# Patient Record
Sex: Female | Born: 1935
Health system: Southern US, Community
[De-identification: ages and names within clinical notes are randomized; demographics above are authoritative.]

## PROBLEM LIST (undated history)

## (undated) DIAGNOSIS — M1990 Unspecified osteoarthritis, unspecified site: Secondary | ICD-10-CM

## (undated) DIAGNOSIS — R112 Nausea with vomiting, unspecified: Secondary | ICD-10-CM

## (undated) DIAGNOSIS — E049 Nontoxic goiter, unspecified: Secondary | ICD-10-CM

## (undated) DIAGNOSIS — R233 Spontaneous ecchymoses: Secondary | ICD-10-CM

## (undated) DIAGNOSIS — Z9889 Other specified postprocedural states: Secondary | ICD-10-CM

## (undated) DIAGNOSIS — Z862 Personal history of diseases of the blood and blood-forming organs and certain disorders involving the immune mechanism: Secondary | ICD-10-CM

## (undated) DIAGNOSIS — Z974 Presence of external hearing-aid: Secondary | ICD-10-CM

## (undated) DIAGNOSIS — D649 Anemia, unspecified: Secondary | ICD-10-CM

## (undated) DIAGNOSIS — I2541 Coronary artery aneurysm: Secondary | ICD-10-CM

## (undated) DIAGNOSIS — M549 Dorsalgia, unspecified: Secondary | ICD-10-CM

## (undated) DIAGNOSIS — Z972 Presence of dental prosthetic device (complete) (partial): Secondary | ICD-10-CM

## (undated) DIAGNOSIS — N179 Acute kidney failure, unspecified: Secondary | ICD-10-CM

## (undated) DIAGNOSIS — Z973 Presence of spectacles and contact lenses: Secondary | ICD-10-CM

## (undated) DIAGNOSIS — T4145XA Adverse effect of unspecified anesthetic, initial encounter: Secondary | ICD-10-CM

## (undated) DIAGNOSIS — Z8639 Personal history of other endocrine, nutritional and metabolic disease: Secondary | ICD-10-CM

## (undated) DIAGNOSIS — K5792 Diverticulitis of intestine, part unspecified, without perforation or abscess without bleeding: Secondary | ICD-10-CM

## (undated) DIAGNOSIS — J849 Interstitial pulmonary disease, unspecified: Secondary | ICD-10-CM

## (undated) DIAGNOSIS — K250 Acute gastric ulcer with hemorrhage: Secondary | ICD-10-CM

## (undated) DIAGNOSIS — R238 Other skin changes: Secondary | ICD-10-CM

## (undated) DIAGNOSIS — C8290 Follicular lymphoma, unspecified, unspecified site: Secondary | ICD-10-CM

## (undated) DIAGNOSIS — R413 Other amnesia: Secondary | ICD-10-CM

## (undated) DIAGNOSIS — T8859XA Other complications of anesthesia, initial encounter: Secondary | ICD-10-CM

## (undated) DIAGNOSIS — K5730 Diverticulosis of large intestine without perforation or abscess without bleeding: Secondary | ICD-10-CM

## (undated) DIAGNOSIS — C4490 Unspecified malignant neoplasm of skin, unspecified: Secondary | ICD-10-CM

## (undated) DIAGNOSIS — C50919 Malignant neoplasm of unspecified site of unspecified female breast: Secondary | ICD-10-CM

## (undated) DIAGNOSIS — R42 Dizziness and giddiness: Secondary | ICD-10-CM

## (undated) DIAGNOSIS — I4891 Unspecified atrial fibrillation: Secondary | ICD-10-CM

## (undated) DIAGNOSIS — I10 Essential (primary) hypertension: Secondary | ICD-10-CM

## (undated) HISTORY — DX: Dizziness and giddiness: R42

## (undated) HISTORY — DX: Dorsalgia, unspecified: M54.9

## (undated) HISTORY — DX: Unspecified osteoarthritis, unspecified site: M19.90

## (undated) HISTORY — DX: Unspecified malignant neoplasm of skin, unspecified: C44.90

## (undated) HISTORY — PX: KNEE SURGERY: SHX244

## (undated) HISTORY — DX: Presence of external hearing-aid: Z97.4

## (undated) HISTORY — DX: Malignant neoplasm of unspecified site of unspecified female breast: C50.919

## (undated) HISTORY — DX: Diverticulosis of large intestine without perforation or abscess without bleeding: K57.30

## (undated) HISTORY — DX: Personal history of diseases of the blood and blood-forming organs and certain disorders involving the immune mechanism: Z86.2

## (undated) HISTORY — PX: BREAST SURGERY: SHX581

## (undated) HISTORY — DX: Unspecified atrial fibrillation: I48.91

## (undated) HISTORY — PX: APPENDECTOMY: SHX54

## (undated) HISTORY — DX: Nontoxic goiter, unspecified: E04.9

## (undated) HISTORY — PX: TONSILLECTOMY: SUR1361

## (undated) HISTORY — DX: Acute gastric ulcer with hemorrhage: K25.0

## (undated) HISTORY — DX: Other specified postprocedural states: Z98.890

## (undated) HISTORY — DX: Presence of dental prosthetic device (complete) (partial): Z97.2

## (undated) HISTORY — PX: TOTAL KNEE ARTHROPLASTY: SHX125

## (undated) HISTORY — DX: Personal history of other endocrine, nutritional and metabolic disease: Z86.39

## (undated) HISTORY — DX: Other amnesia: R41.3

## (undated) HISTORY — DX: Essential (primary) hypertension: I10

## (undated) HISTORY — PX: ABDOMINAL HYSTERECTOMY: SHX81

## (undated) HISTORY — PX: LOOP RECORDER IMPLANT: SHX5954

## (undated) HISTORY — PX: COLONOSCOPY: SHX174

## (undated) HISTORY — DX: Presence of spectacles and contact lenses: Z97.3

## (undated) HISTORY — DX: Acute kidney failure, unspecified: N17.9

---

## 1996-01-25 HISTORY — PX: OTHER SURGICAL HISTORY: SHX169

## 1997-01-24 HISTORY — PX: ROTATOR CUFF REPAIR: SHX139

## 1997-10-23 ENCOUNTER — Ambulatory Visit (HOSPITAL_COMMUNITY): Admission: RE | Admit: 1997-10-23 | Discharge: 1997-10-23 | Payer: Self-pay | Admitting: Internal Medicine

## 1998-11-13 ENCOUNTER — Encounter: Payer: Self-pay | Admitting: Internal Medicine

## 1998-11-13 ENCOUNTER — Ambulatory Visit (HOSPITAL_COMMUNITY): Admission: RE | Admit: 1998-11-13 | Discharge: 1998-11-13 | Payer: Self-pay | Admitting: Internal Medicine

## 1999-11-15 ENCOUNTER — Ambulatory Visit (HOSPITAL_COMMUNITY): Admission: RE | Admit: 1999-11-15 | Discharge: 1999-11-15 | Payer: Self-pay | Admitting: Internal Medicine

## 1999-11-15 ENCOUNTER — Encounter: Payer: Self-pay | Admitting: Internal Medicine

## 1999-12-02 ENCOUNTER — Emergency Department (HOSPITAL_COMMUNITY): Admission: EM | Admit: 1999-12-02 | Discharge: 1999-12-02 | Payer: Self-pay | Admitting: Emergency Medicine

## 1999-12-02 ENCOUNTER — Encounter: Payer: Self-pay | Admitting: Emergency Medicine

## 2000-11-15 ENCOUNTER — Ambulatory Visit (HOSPITAL_COMMUNITY): Admission: RE | Admit: 2000-11-15 | Discharge: 2000-11-15 | Payer: Self-pay | Admitting: Internal Medicine

## 2000-11-15 ENCOUNTER — Encounter: Payer: Self-pay | Admitting: Internal Medicine

## 2001-10-30 ENCOUNTER — Ambulatory Visit (HOSPITAL_COMMUNITY): Admission: RE | Admit: 2001-10-30 | Discharge: 2001-10-30 | Payer: Self-pay | Admitting: Internal Medicine

## 2001-10-30 ENCOUNTER — Encounter: Payer: Self-pay | Admitting: Internal Medicine

## 2001-11-01 ENCOUNTER — Encounter: Payer: Self-pay | Admitting: Internal Medicine

## 2001-11-01 ENCOUNTER — Encounter: Admission: RE | Admit: 2001-11-01 | Discharge: 2001-11-01 | Payer: Self-pay | Admitting: Internal Medicine

## 2002-06-05 ENCOUNTER — Encounter: Admission: RE | Admit: 2002-06-05 | Discharge: 2002-06-05 | Payer: Self-pay | Admitting: Orthopedic Surgery

## 2002-06-05 ENCOUNTER — Encounter: Payer: Self-pay | Admitting: Diagnostic Radiology

## 2002-07-19 ENCOUNTER — Encounter: Payer: Self-pay | Admitting: Orthopedic Surgery

## 2002-07-24 ENCOUNTER — Inpatient Hospital Stay (HOSPITAL_COMMUNITY): Admission: RE | Admit: 2002-07-24 | Discharge: 2002-07-28 | Payer: Self-pay | Admitting: Orthopedic Surgery

## 2002-07-24 ENCOUNTER — Encounter: Payer: Self-pay | Admitting: Orthopedic Surgery

## 2002-09-11 ENCOUNTER — Encounter (INDEPENDENT_AMBULATORY_CARE_PROVIDER_SITE_OTHER): Payer: Self-pay | Admitting: *Deleted

## 2002-09-11 ENCOUNTER — Encounter: Payer: Self-pay | Admitting: Internal Medicine

## 2002-09-11 ENCOUNTER — Other Ambulatory Visit: Admission: RE | Admit: 2002-09-11 | Discharge: 2002-09-11 | Payer: Self-pay | Admitting: Diagnostic Radiology

## 2002-09-11 ENCOUNTER — Encounter: Admission: RE | Admit: 2002-09-11 | Discharge: 2002-09-11 | Payer: Self-pay | Admitting: Internal Medicine

## 2002-10-02 ENCOUNTER — Encounter: Payer: Self-pay | Admitting: Internal Medicine

## 2002-10-02 ENCOUNTER — Encounter: Admission: RE | Admit: 2002-10-02 | Discharge: 2002-10-02 | Payer: Self-pay | Admitting: Internal Medicine

## 2003-10-28 ENCOUNTER — Encounter: Admission: RE | Admit: 2003-10-28 | Discharge: 2003-10-28 | Payer: Self-pay | Admitting: Internal Medicine

## 2003-12-09 ENCOUNTER — Ambulatory Visit: Payer: Self-pay | Admitting: Internal Medicine

## 2004-06-29 ENCOUNTER — Ambulatory Visit: Payer: Self-pay | Admitting: Internal Medicine

## 2004-11-03 ENCOUNTER — Encounter: Admission: RE | Admit: 2004-11-03 | Discharge: 2004-11-03 | Payer: Self-pay | Admitting: Internal Medicine

## 2004-11-19 ENCOUNTER — Ambulatory Visit: Payer: Self-pay | Admitting: Internal Medicine

## 2005-03-21 ENCOUNTER — Ambulatory Visit: Payer: Self-pay | Admitting: Internal Medicine

## 2005-03-25 ENCOUNTER — Ambulatory Visit: Payer: Self-pay | Admitting: Internal Medicine

## 2005-04-04 ENCOUNTER — Ambulatory Visit: Payer: Self-pay | Admitting: Gastroenterology

## 2005-04-14 ENCOUNTER — Encounter: Payer: Self-pay | Admitting: Internal Medicine

## 2005-04-14 ENCOUNTER — Ambulatory Visit: Payer: Self-pay | Admitting: Gastroenterology

## 2005-04-25 ENCOUNTER — Ambulatory Visit: Payer: Self-pay | Admitting: Internal Medicine

## 2005-04-27 ENCOUNTER — Encounter: Admission: RE | Admit: 2005-04-27 | Discharge: 2005-04-27 | Payer: Self-pay | Admitting: Internal Medicine

## 2005-11-04 ENCOUNTER — Encounter: Payer: Self-pay | Admitting: Internal Medicine

## 2005-11-04 ENCOUNTER — Encounter: Admission: RE | Admit: 2005-11-04 | Discharge: 2005-11-04 | Payer: Self-pay | Admitting: Internal Medicine

## 2005-12-14 ENCOUNTER — Ambulatory Visit: Payer: Self-pay | Admitting: Internal Medicine

## 2005-12-14 LAB — CONVERTED CEMR LAB
ALT: 74 units/L — ABNORMAL HIGH (ref 0–40)
AST: 76 units/L — ABNORMAL HIGH (ref 0–37)
Albumin: 3.8 g/dL (ref 3.5–5.2)
Alkaline Phosphatase: 90 units/L (ref 39–117)
BUN: 16 mg/dL (ref 6–23)
Bilirubin, Direct: 0.1 mg/dL (ref 0.0–0.3)
CO2: 30 meq/L (ref 19–32)
Calcium: 9.5 mg/dL (ref 8.4–10.5)
Chloride: 108 meq/L (ref 96–112)
Creatinine, Ser: 0.7 mg/dL (ref 0.4–1.2)
FSH: 46.9 milliintl units/mL
GFR calc non Af Amer: 88 mL/min
Glomerular Filtration Rate, Af Am: 107 mL/min/{1.73_m2}
Glucose, Bld: 91 mg/dL (ref 70–99)
HCT: 45.1 % (ref 36.0–46.0)
Hemoglobin: 14.9 g/dL (ref 12.0–15.0)
MCHC: 33 g/dL (ref 30.0–36.0)
MCV: 97.8 fL (ref 78.0–100.0)
Platelets: 201 10*3/uL (ref 150–400)
Potassium: 4.7 meq/L (ref 3.5–5.1)
RBC: 4.62 M/uL (ref 3.87–5.11)
RDW: 12.1 % (ref 11.5–14.6)
Sodium: 143 meq/L (ref 135–145)
TSH: 0.43 microintl units/mL (ref 0.35–5.50)
Total Bilirubin: 1.1 mg/dL (ref 0.3–1.2)
Total Protein: 6.6 g/dL (ref 6.0–8.3)
WBC: 8.2 10*3/uL (ref 4.5–10.5)

## 2005-12-19 ENCOUNTER — Encounter: Payer: Self-pay | Admitting: Internal Medicine

## 2005-12-19 ENCOUNTER — Ambulatory Visit: Payer: Self-pay

## 2006-03-24 ENCOUNTER — Ambulatory Visit: Payer: Self-pay | Admitting: Internal Medicine

## 2006-04-05 ENCOUNTER — Encounter: Payer: Self-pay | Admitting: Internal Medicine

## 2006-04-05 ENCOUNTER — Ambulatory Visit: Payer: Self-pay | Admitting: Internal Medicine

## 2006-11-06 ENCOUNTER — Encounter: Admission: RE | Admit: 2006-11-06 | Discharge: 2006-11-06 | Payer: Self-pay | Admitting: Internal Medicine

## 2006-11-07 ENCOUNTER — Ambulatory Visit: Payer: Self-pay | Admitting: Internal Medicine

## 2006-12-26 DIAGNOSIS — M1990 Unspecified osteoarthritis, unspecified site: Secondary | ICD-10-CM

## 2006-12-26 DIAGNOSIS — K5730 Diverticulosis of large intestine without perforation or abscess without bleeding: Secondary | ICD-10-CM | POA: Insufficient documentation

## 2006-12-26 HISTORY — DX: Unspecified osteoarthritis, unspecified site: M19.90

## 2006-12-26 HISTORY — DX: Diverticulosis of large intestine without perforation or abscess without bleeding: K57.30

## 2006-12-27 ENCOUNTER — Inpatient Hospital Stay (HOSPITAL_COMMUNITY): Admission: EM | Admit: 2006-12-27 | Discharge: 2006-12-29 | Payer: Self-pay | Admitting: Emergency Medicine

## 2006-12-27 ENCOUNTER — Ambulatory Visit: Payer: Self-pay | Admitting: Internal Medicine

## 2006-12-27 ENCOUNTER — Ambulatory Visit: Payer: Self-pay | Admitting: Cardiology

## 2006-12-27 DIAGNOSIS — I4892 Unspecified atrial flutter: Secondary | ICD-10-CM

## 2006-12-28 ENCOUNTER — Encounter: Payer: Self-pay | Admitting: Cardiology

## 2007-01-01 ENCOUNTER — Ambulatory Visit: Payer: Self-pay | Admitting: Internal Medicine

## 2007-01-01 DIAGNOSIS — I4891 Unspecified atrial fibrillation: Secondary | ICD-10-CM

## 2007-01-01 DIAGNOSIS — I4821 Permanent atrial fibrillation: Secondary | ICD-10-CM | POA: Insufficient documentation

## 2007-01-01 HISTORY — DX: Unspecified atrial fibrillation: I48.91

## 2007-01-01 HISTORY — DX: Permanent atrial fibrillation: I48.21

## 2007-01-05 ENCOUNTER — Ambulatory Visit: Payer: Self-pay | Admitting: Cardiology

## 2007-01-11 ENCOUNTER — Ambulatory Visit: Payer: Self-pay | Admitting: Cardiology

## 2007-01-22 ENCOUNTER — Ambulatory Visit: Payer: Self-pay | Admitting: Cardiology

## 2007-01-25 DIAGNOSIS — Z9889 Other specified postprocedural states: Secondary | ICD-10-CM

## 2007-01-25 HISTORY — PX: ABLATION OF DYSRHYTHMIC FOCUS: SHX254

## 2007-01-25 HISTORY — DX: Other specified postprocedural states: Z98.890

## 2007-01-29 ENCOUNTER — Ambulatory Visit: Payer: Self-pay | Admitting: Internal Medicine

## 2007-01-29 ENCOUNTER — Ambulatory Visit: Payer: Self-pay | Admitting: Cardiology

## 2007-01-29 LAB — CONVERTED CEMR LAB
BUN: 18 mg/dL (ref 6–23)
Basophils Absolute: 0 10*3/uL (ref 0.0–0.1)
Basophils Relative: 0 % (ref 0.0–1.0)
CO2: 30 meq/L (ref 19–32)
Calcium: 9.9 mg/dL (ref 8.4–10.5)
Chloride: 104 meq/L (ref 96–112)
Creatinine, Ser: 0.7 mg/dL (ref 0.4–1.2)
Eosinophils Absolute: 0.4 10*3/uL (ref 0.0–0.6)
Eosinophils Relative: 3.6 % (ref 0.0–5.0)
GFR calc Af Amer: 107 mL/min
GFR calc non Af Amer: 88 mL/min
Glucose, Bld: 102 mg/dL — ABNORMAL HIGH (ref 70–99)
HCT: 47 % — ABNORMAL HIGH (ref 36.0–46.0)
Hemoglobin: 16 g/dL — ABNORMAL HIGH (ref 12.0–15.0)
INR: 1.9 — ABNORMAL HIGH (ref 0.8–1.0)
Lymphocytes Relative: 17.6 % (ref 12.0–46.0)
MCHC: 34.1 g/dL (ref 30.0–36.0)
MCV: 96.2 fL (ref 78.0–100.0)
Monocytes Absolute: 0.4 10*3/uL (ref 0.2–0.7)
Monocytes Relative: 3.9 % (ref 3.0–11.0)
Neutro Abs: 7.9 10*3/uL — ABNORMAL HIGH (ref 1.4–7.7)
Neutrophils Relative %: 74.9 % (ref 43.0–77.0)
Platelets: 224 10*3/uL (ref 150–400)
Potassium: 4.5 meq/L (ref 3.5–5.1)
Prothrombin Time: 17.3 s — ABNORMAL HIGH (ref 10.9–13.3)
RBC: 4.88 M/uL (ref 3.87–5.11)
RDW: 11.8 % (ref 11.5–14.6)
Sodium: 142 meq/L (ref 135–145)
WBC: 10.5 10*3/uL (ref 4.5–10.5)
aPTT: 30.1 s — ABNORMAL HIGH (ref 21.7–29.8)

## 2007-02-06 ENCOUNTER — Encounter: Payer: Self-pay | Admitting: Internal Medicine

## 2007-02-08 ENCOUNTER — Ambulatory Visit: Payer: Self-pay | Admitting: Internal Medicine

## 2007-02-08 ENCOUNTER — Ambulatory Visit (HOSPITAL_COMMUNITY): Admission: RE | Admit: 2007-02-08 | Discharge: 2007-02-09 | Payer: Self-pay | Admitting: Internal Medicine

## 2007-02-13 ENCOUNTER — Ambulatory Visit: Payer: Self-pay

## 2007-03-12 ENCOUNTER — Ambulatory Visit: Payer: Self-pay | Admitting: Internal Medicine

## 2007-05-16 ENCOUNTER — Ambulatory Visit: Payer: Self-pay | Admitting: Internal Medicine

## 2007-05-16 DIAGNOSIS — R413 Other amnesia: Secondary | ICD-10-CM

## 2007-05-16 HISTORY — DX: Other amnesia: R41.3

## 2007-08-10 ENCOUNTER — Ambulatory Visit: Payer: Self-pay | Admitting: Internal Medicine

## 2007-10-29 ENCOUNTER — Ambulatory Visit: Payer: Self-pay | Admitting: Internal Medicine

## 2007-10-29 DIAGNOSIS — R109 Unspecified abdominal pain: Secondary | ICD-10-CM | POA: Insufficient documentation

## 2007-11-07 ENCOUNTER — Encounter: Admission: RE | Admit: 2007-11-07 | Discharge: 2007-11-07 | Payer: Self-pay | Admitting: Internal Medicine

## 2007-11-16 ENCOUNTER — Encounter: Admission: RE | Admit: 2007-11-16 | Discharge: 2007-11-16 | Payer: Self-pay | Admitting: Internal Medicine

## 2007-12-18 ENCOUNTER — Telehealth: Payer: Self-pay | Admitting: Internal Medicine

## 2007-12-18 ENCOUNTER — Ambulatory Visit: Payer: Self-pay | Admitting: Internal Medicine

## 2007-12-21 ENCOUNTER — Telehealth (INDEPENDENT_AMBULATORY_CARE_PROVIDER_SITE_OTHER): Payer: Self-pay

## 2007-12-21 ENCOUNTER — Telehealth: Payer: Self-pay | Admitting: Family Medicine

## 2007-12-26 ENCOUNTER — Ambulatory Visit: Payer: Self-pay | Admitting: Internal Medicine

## 2007-12-26 DIAGNOSIS — R7989 Other specified abnormal findings of blood chemistry: Secondary | ICD-10-CM | POA: Insufficient documentation

## 2007-12-26 DIAGNOSIS — Z8639 Personal history of other endocrine, nutritional and metabolic disease: Secondary | ICD-10-CM

## 2007-12-26 DIAGNOSIS — R945 Abnormal results of liver function studies: Secondary | ICD-10-CM

## 2007-12-26 DIAGNOSIS — Z862 Personal history of diseases of the blood and blood-forming organs and certain disorders involving the immune mechanism: Secondary | ICD-10-CM

## 2007-12-26 HISTORY — DX: Personal history of diseases of the blood and blood-forming organs and certain disorders involving the immune mechanism: Z86.39

## 2007-12-26 HISTORY — DX: Personal history of diseases of the blood and blood-forming organs and certain disorders involving the immune mechanism: Z86.2

## 2007-12-26 LAB — CONVERTED CEMR LAB
ALT: 34 units/L (ref 0–35)
AST: 29 units/L (ref 0–37)
Albumin: 4.3 g/dL (ref 3.5–5.2)
Alkaline Phosphatase: 85 units/L (ref 39–117)
BUN: 25 mg/dL — ABNORMAL HIGH (ref 6–23)
Basophils Absolute: 0 10*3/uL (ref 0.0–0.1)
Basophils Relative: 0.3 % (ref 0.0–3.0)
Bilirubin, Direct: 0.1 mg/dL (ref 0.0–0.3)
CO2: 29 meq/L (ref 19–32)
Calcium: 10.1 mg/dL (ref 8.4–10.5)
Chloride: 105 meq/L (ref 96–112)
Cholesterol: 186 mg/dL (ref 0–200)
Creatinine, Ser: 0.7 mg/dL (ref 0.4–1.2)
Eosinophils Absolute: 0.2 10*3/uL (ref 0.0–0.7)
Eosinophils Relative: 1.6 % (ref 0.0–5.0)
GFR calc Af Amer: 106 mL/min
GFR calc non Af Amer: 88 mL/min
Glucose, Bld: 92 mg/dL (ref 70–99)
HCT: 52.6 % — ABNORMAL HIGH (ref 36.0–46.0)
HDL: 34.7 mg/dL — ABNORMAL LOW (ref >39.0)
Hemoglobin: 17.5 g/dL — ABNORMAL HIGH (ref 12.0–15.0)
LDL Cholesterol: 130 mg/dL — ABNORMAL HIGH (ref 0–99)
Lymphocytes Relative: 21.5 % (ref 12.0–46.0)
MCHC: 33.3 g/dL (ref 30.0–36.0)
MCV: 95.8 fL (ref 78.0–100.0)
Monocytes Absolute: 0.6 10*3/uL (ref 0.1–1.0)
Monocytes Relative: 4.5 % (ref 3.0–12.0)
Neutro Abs: 9.1 10*3/uL — ABNORMAL HIGH (ref 1.4–7.7)
Neutrophils Relative %: 72.1 % (ref 43.0–77.0)
Platelets: 211 10*3/uL (ref 150–400)
Potassium: 4.5 meq/L (ref 3.5–5.1)
RBC: 5.48 M/uL — ABNORMAL HIGH (ref 3.87–5.11)
RDW: 12.1 % (ref 11.5–14.6)
Sodium: 144 meq/L (ref 135–145)
TSH: 0.62 microintl units/mL (ref 0.35–5.50)
Total Bilirubin: 1 mg/dL (ref 0.3–1.2)
Total CHOL/HDL Ratio: 5.4
Total Protein: 8.1 g/dL (ref 6.0–8.3)
Triglycerides: 106 mg/dL (ref 0–149)
VLDL: 21 mg/dL (ref 0–40)
WBC: 12.6 10*3/uL — ABNORMAL HIGH (ref 4.5–10.5)

## 2008-01-25 HISTORY — PX: CATARACT EXTRACTION W/ INTRAOCULAR LENS  IMPLANT, BILATERAL: SHX1307

## 2008-01-30 ENCOUNTER — Inpatient Hospital Stay (HOSPITAL_COMMUNITY): Admission: RE | Admit: 2008-01-30 | Discharge: 2008-02-02 | Payer: Self-pay | Admitting: Orthopedic Surgery

## 2008-03-18 ENCOUNTER — Ambulatory Visit: Payer: Self-pay | Admitting: Internal Medicine

## 2008-03-18 DIAGNOSIS — R5381 Other malaise: Secondary | ICD-10-CM | POA: Insufficient documentation

## 2008-03-18 DIAGNOSIS — R5383 Other fatigue: Secondary | ICD-10-CM

## 2008-03-18 LAB — CONVERTED CEMR LAB
ALT: 19 units/L (ref 0–35)
AST: 23 units/L (ref 0–37)
Albumin: 4.2 g/dL (ref 3.5–5.2)
Alkaline Phosphatase: 80 units/L (ref 39–117)
BUN: 17 mg/dL (ref 6–23)
Basophils Absolute: 0 10*3/uL (ref 0.0–0.1)
Basophils Relative: 0.5 % (ref 0.0–3.0)
Bilirubin, Direct: 0.1 mg/dL (ref 0.0–0.3)
CO2: 30 meq/L (ref 19–32)
Calcium: 9.9 mg/dL (ref 8.4–10.5)
Chloride: 103 meq/L (ref 96–112)
Creatinine, Ser: 0.5 mg/dL (ref 0.4–1.2)
Eosinophils Absolute: 0.3 10*3/uL (ref 0.0–0.7)
Eosinophils Relative: 4.5 % (ref 0.0–5.0)
GFR calc Af Amer: 157 mL/min
GFR calc non Af Amer: 130 mL/min
Glucose, Bld: 91 mg/dL (ref 70–99)
HCT: 42.7 % (ref 36.0–46.0)
Hemoglobin: 14.5 g/dL (ref 12.0–15.0)
Lymphocytes Relative: 25.4 % (ref 12.0–46.0)
MCHC: 33.9 g/dL (ref 30.0–36.0)
MCV: 95.1 fL (ref 78.0–100.0)
Monocytes Absolute: 0.5 10*3/uL (ref 0.1–1.0)
Monocytes Relative: 6.4 % (ref 3.0–12.0)
Neutro Abs: 4.8 10*3/uL (ref 1.4–7.7)
Neutrophils Relative %: 63.2 % (ref 43.0–77.0)
Platelets: 187 10*3/uL (ref 150–400)
Potassium: 3.8 meq/L (ref 3.5–5.1)
RBC: 4.49 M/uL (ref 3.87–5.11)
RDW: 13.3 % (ref 11.5–14.6)
Sodium: 145 meq/L (ref 135–145)
TSH: 0.59 microintl units/mL (ref 0.35–5.50)
Total Bilirubin: 0.7 mg/dL (ref 0.3–1.2)
Total Protein: 7.3 g/dL (ref 6.0–8.3)
WBC: 7.5 10*3/uL (ref 4.5–10.5)

## 2008-05-27 ENCOUNTER — Emergency Department (HOSPITAL_COMMUNITY): Admission: EM | Admit: 2008-05-27 | Discharge: 2008-05-27 | Payer: Self-pay | Admitting: Emergency Medicine

## 2008-05-30 ENCOUNTER — Ambulatory Visit: Payer: Self-pay | Admitting: Internal Medicine

## 2008-07-09 ENCOUNTER — Ambulatory Visit: Payer: Self-pay | Admitting: Family Medicine

## 2008-07-09 ENCOUNTER — Telehealth: Payer: Self-pay | Admitting: Family Medicine

## 2008-07-09 DIAGNOSIS — M549 Dorsalgia, unspecified: Secondary | ICD-10-CM

## 2008-07-09 HISTORY — DX: Dorsalgia, unspecified: M54.9

## 2008-07-21 ENCOUNTER — Ambulatory Visit: Payer: Self-pay | Admitting: Internal Medicine

## 2008-11-10 ENCOUNTER — Ambulatory Visit: Payer: Self-pay | Admitting: Internal Medicine

## 2008-11-17 ENCOUNTER — Encounter: Admission: RE | Admit: 2008-11-17 | Discharge: 2008-11-17 | Payer: Self-pay | Admitting: Internal Medicine

## 2008-11-20 ENCOUNTER — Encounter: Payer: Self-pay | Admitting: Internal Medicine

## 2008-11-24 ENCOUNTER — Encounter: Admission: RE | Admit: 2008-11-24 | Discharge: 2008-11-24 | Payer: Self-pay | Admitting: Internal Medicine

## 2008-12-26 ENCOUNTER — Ambulatory Visit: Payer: Self-pay | Admitting: Internal Medicine

## 2008-12-26 LAB — CONVERTED CEMR LAB
Albumin: 4.1 g/dL (ref 3.5–5.2)
BUN: 15 mg/dL (ref 6–23)
Basophils Absolute: 0.1 10*3/uL (ref 0.0–0.1)
CO2: 30 meq/L (ref 19–32)
Calcium: 9.7 mg/dL (ref 8.4–10.5)
Cholesterol: 206 mg/dL — ABNORMAL HIGH (ref 0–200)
Eosinophils Absolute: 0.3 10*3/uL (ref 0.0–0.7)
Glucose, Bld: 93 mg/dL (ref 70–99)
HCT: 44 % (ref 36.0–46.0)
Hemoglobin: 15 g/dL (ref 12.0–15.0)
Lymphocytes Relative: 25.3 % (ref 12.0–46.0)
Lymphs Abs: 1.7 10*3/uL (ref 0.7–4.0)
MCHC: 34.1 g/dL (ref 30.0–36.0)
Neutro Abs: 4 10*3/uL (ref 1.4–7.7)
RDW: 12.3 % (ref 11.5–14.6)
Sodium: 142 meq/L (ref 135–145)
Total Protein: 7.4 g/dL (ref 6.0–8.3)
Triglycerides: 120 mg/dL (ref 0.0–149.0)

## 2009-03-16 ENCOUNTER — Ambulatory Visit: Payer: Self-pay | Admitting: Internal Medicine

## 2009-03-16 DIAGNOSIS — J069 Acute upper respiratory infection, unspecified: Secondary | ICD-10-CM | POA: Insufficient documentation

## 2009-05-26 ENCOUNTER — Encounter: Admission: RE | Admit: 2009-05-26 | Discharge: 2009-05-26 | Payer: Self-pay | Admitting: Internal Medicine

## 2009-09-07 ENCOUNTER — Ambulatory Visit: Payer: Self-pay | Admitting: Family Medicine

## 2009-09-07 DIAGNOSIS — R42 Dizziness and giddiness: Secondary | ICD-10-CM | POA: Insufficient documentation

## 2009-09-07 HISTORY — DX: Dizziness and giddiness: R42

## 2009-09-09 ENCOUNTER — Telehealth: Payer: Self-pay | Admitting: Internal Medicine

## 2009-09-14 ENCOUNTER — Ambulatory Visit: Payer: Self-pay | Admitting: Internal Medicine

## 2009-09-30 ENCOUNTER — Telehealth: Payer: Self-pay | Admitting: Internal Medicine

## 2009-10-13 ENCOUNTER — Encounter: Payer: Self-pay | Admitting: Internal Medicine

## 2009-10-16 ENCOUNTER — Ambulatory Visit: Payer: Self-pay | Admitting: Cardiology

## 2009-10-16 ENCOUNTER — Ambulatory Visit (HOSPITAL_COMMUNITY): Admission: RE | Admit: 2009-10-16 | Discharge: 2009-10-16 | Payer: Self-pay | Admitting: Diagnostic Neuroimaging

## 2009-10-16 ENCOUNTER — Ambulatory Visit: Payer: Self-pay

## 2009-10-16 ENCOUNTER — Encounter (INDEPENDENT_AMBULATORY_CARE_PROVIDER_SITE_OTHER): Payer: Self-pay | Admitting: Diagnostic Neuroimaging

## 2009-10-25 ENCOUNTER — Encounter: Admission: RE | Admit: 2009-10-25 | Discharge: 2009-10-25 | Payer: Self-pay | Admitting: Diagnostic Neuroimaging

## 2009-10-25 ENCOUNTER — Encounter: Payer: Self-pay | Admitting: Internal Medicine

## 2009-12-03 ENCOUNTER — Encounter: Payer: Self-pay | Admitting: Internal Medicine

## 2009-12-29 ENCOUNTER — Encounter: Payer: Self-pay | Admitting: Internal Medicine

## 2009-12-29 ENCOUNTER — Ambulatory Visit: Payer: Self-pay | Admitting: Internal Medicine

## 2009-12-29 LAB — CONVERTED CEMR LAB
ALT: 49 units/L — ABNORMAL HIGH (ref 0–35)
AST: 38 units/L — ABNORMAL HIGH (ref 0–37)
Albumin: 4.2 g/dL (ref 3.5–5.2)
Cholesterol: 194 mg/dL (ref 0–200)
Eosinophils Relative: 4.1 % (ref 0.0–5.0)
GFR calc non Af Amer: 108.55 mL/min (ref >60.00)
HCT: 43.3 % (ref 36.0–46.0)
Hemoglobin: 14.7 g/dL (ref 12.0–15.0)
LDL Cholesterol: 134 mg/dL — ABNORMAL HIGH (ref 0–99)
Lymphs Abs: 2.1 10*3/uL (ref 0.7–4.0)
Monocytes Relative: 7.7 % (ref 3.0–12.0)
Neutro Abs: 4.3 10*3/uL (ref 1.4–7.7)
Potassium: 4.4 meq/L (ref 3.5–5.1)
Sodium: 142 meq/L (ref 135–145)
Total Bilirubin: 1 mg/dL (ref 0.3–1.2)
WBC: 7.3 10*3/uL (ref 4.5–10.5)

## 2010-02-04 ENCOUNTER — Encounter
Admission: RE | Admit: 2010-02-04 | Discharge: 2010-02-04 | Payer: Self-pay | Source: Home / Self Care | Attending: Internal Medicine | Admitting: Internal Medicine

## 2010-02-05 ENCOUNTER — Encounter: Payer: Self-pay | Admitting: Internal Medicine

## 2010-02-13 ENCOUNTER — Encounter: Payer: Self-pay | Admitting: Internal Medicine

## 2010-02-14 ENCOUNTER — Encounter: Payer: Self-pay | Admitting: Internal Medicine

## 2010-02-23 NOTE — Assessment & Plan Note (Signed)
Summary: vertigo has worsened--ok to work in per [place]//ccm   Vital Signs:  Patient profile:   75 year old female Weight:      216 pounds Temp:     97.8 degrees F oral BP sitting:   140 / 80  (left arm) Cuff size:   regular  Vitals Entered By: Kathrynn Speed CMA (September 14, 2009 1:40 PM) CC: vertigo has worsened, nausea, today, src Is Patient Diabetic? No   CC:  vertigo has worsened, nausea, today, and src.  History of Present Illness: 24 -year-old patient who is seen today for evaluation of recurrent vertigo.  This began initially on August 2.  She has had the 3 or 4 of severe episodes of severe spinning sensation, consistent with true vertigo, associated nausea, and occasional vomiting.  She describes intermittent ringing in the ears.  She has a long history of partial deafness, which has not worsened.  She uses hearing aids bilaterally.  Also describes a dull sensation in the occipital region of the head.  She has been scheduled for a neurology evaluation.  Denies any focal neurological symptoms.  Denies any double vision, dysarthria, or focal weakness  Current Medications (verified): 1)  Diclofenac Sodium 75 Mg  Tbec (Diclofenac Sodium) .... One Twice Daily 2)  Adult Aspirin Ec Low Strength 81 Mg Tbec (Aspirin) .[name].. 1 Once Daily  Allergies (verified): 1)  ! Codeine Phosphate (Codeine Phosphate)  Past History:  Past Medical History: Reviewed history from 12/26/2007 and no changes required. Diverticulosis, colon Osteoarthritis Elevated LFT's multi-nodular goiter history of gastritis, 1993 history benign 3-mm nodule right upper lobe (.  Chest CT 03-24-2006) paroxysmal atrial fibrillation and  ablation December 2008 mild cognitive impairment  Review of Systems       The patient complains of difficulty walking.  The patient denies anorexia, fever, weight loss, weight gain, vision loss, decreased hearing, hoarseness, chest pain, syncope, dyspnea on exertion, peripheral edema,  prolonged cough, headaches, hemoptysis, abdominal pain, melena, hematochezia, severe indigestion/heartburn, hematuria, incontinence, genital sores, muscle weakness, suspicious skin lesions, transient blindness, depression, unusual weight change, abnormal bleeding, enlarged lymph nodes, angioedema, and breast masses.    Physical Exam  General:  Well-developed,well-nourished,in no acute distress; alert,appropriate and cooperative throughout examination; blood  pressure 130/80 Head:  Normocephalic and atraumatic without obvious abnormalities. No apparent alopecia or balding. Eyes:  No corneal or conjunctival inflammation noted. EOMI. Perrla. Funduscopic exam benign, without hemorrhages, exudates or papilledema. Vision grossly normal. Ears:  External ear exam shows no significant lesions or deformities.  Otoscopic examination reveals clear canals, tympanic membranes are intact bilaterally without bulging, retraction, inflammation or discharge. Hearing is grossly normal bilaterally. Nose:  External nasal examination shows no deformity or inflammation. Nasal mucosa are pink and moist without lesions or exudates. Mouth:  Oral mucosa and oropharynx without lesions or exudates.  Teeth in good repair. Neck:  No deformities, masses, or tenderness noted. Neurologic:  physiologic nystagmus only on lateral gazealert & oriented X3, cranial nerves II-XII intact, gait normal, and finger-to-nose normal.  alert & oriented X3, cranial nerves II-XII intact, gait normal, and finger-to-nose normal.     Impression & Recommendations:  Problem # 1:  VERTIGO (ICD-780.4)  The following medications were removed from the medication list:    Meclizine Hcl 25 Mg Tabs (Meclizine hcl) .[name]... Take one tab by mouth once daily Her updated medication list for this problem includes:    Promethazine Hcl 50 Mg Tabs (Promethazine hcl) ..... One every 6 hours as for nausea, or  vertigo  written instructions for self treatment of  benign  positional vertigo dispensed.     The following medications were removed from the medication list:    Meclizine Hcl 25 Mg Tabs (Meclizine hcl) .[name]... Take one tab by mouth once daily Her updated medication list for this problem includes:    Promethazine Hcl 50 Mg Tabs (Promethazine hcl) ..... One every 6 hours as for nausea, or vertigo  Orders: Neurology Referral (Neuro) Promethazine up to 50mg  (J2550) Admin of Therapeutic Inj  intramuscular or subcutaneous (42706)  Complete Medication List: 1)  Diclofenac Sodium 75 Mg Tbec (Diclofenac sodium) .... One twice daily 2)  Adult Aspirin Ec Low Strength 81 Mg Tbec (Aspirin) .[name].. 1 once daily 3)  Promethazine Hcl 50 Mg Tabs (Promethazine hcl) .... One every 6 hours as for nausea, or vertigo  Patient Instructions: 1)  followed neurology as scheduled 2)  Please schedule a follow-up appointment as needed. 3)  perform self treatment of benign positional vertigo as directed 4 times daily Prescriptions: PROMETHAZINE HCL 50 MG TABS (PROMETHAZINE HCL) one every 6 hours as for nausea, or vertigo  #30 x 2   Entered and Authorized by:   Gordy Savers  MD   Signed by:   Gordy Savers  MD on 09/14/2009   Method used:   Electronically to        [facility] [address] [phone]* (retail)       [address]       [place], [place]  [address]       Ph: [phone] or [phone]       Fax: [phone]   RxID:   [id number]    Medication Administration  Injection # 1:    Medication: Promethazine up to 50mg     Diagnosis: VERTIGO (ICD-780.4)    Route: IM    Site: RUOQ gluteus    Exp Date: 09/25/2010    Lot #: [id number]    Mfr: [place]-ward    Patient tolerated injection without complications    Given by: Kathrynn Speed CMA (September 14, 2009 3:07 PM)  Orders Added: 1)  Est. Patient Level III [58527] 2)  Neurology Referral [Neuro] 3)  Promethazine up to 50mg  [J2550] 4)  Admin of Therapeutic Inj  intramuscular or  subcutaneous [78242]

## 2010-02-23 NOTE — Progress Notes (Signed)
Summary: Pt called req a referral to spec for Vertigo  Phone Note Call from Patient Call back at Home Phone [phone]   Caller: Patient Summary of Call: Pt called and came in to see Dr. Tawanna Cooler on Monday 09/07/09 for vertigo, and was told that if condition did not improve, to contact Dr. Amador Cunas to get referral to a specialist.  Pls call pt with referral info.  Initial call taken by: Lucy Antigua,  September 09, 2009 10:01 AM  Follow-up for Phone Call        Dr. Amador Cunas to advise Follow-up by: Duard Brady LPN,  September 09, 2009 11:39 AM  Additional Follow-up for Phone Call Additional follow up Details #1::        neurology referral if patient still symptomatic Additional Follow-up by: Gordy Savers  MD,  September 09, 2009 12:45 PM    Additional Follow-up for Phone Call Additional follow up Details #2::    info sent to terri. KIK  pt aware we are arranging. KIK Follow-up by: Duard Brady LPN,  September 09, 2009 12:53 PM

## 2010-02-23 NOTE — Consult Note (Signed)
Summary: [facility]  [facility]   Imported By: Maryln Gottron 10/19/2009 13:39:38  _____________________________________________________________________  External Attachment:    Type:   Image     Comment:   External Document

## 2010-02-23 NOTE — Assessment & Plan Note (Signed)
Summary: nausea/dizziness/njr   Vital Signs:  Patient profile:   75 year old female Height:      68 inches Weight:      214 pounds BMI:     32.66 Temp:     98.0 degrees F BP sitting:   172 / 102  (right arm)  Vitals Entered By: Kern Reap CMA Duncan Dull) (September 07, 2009 12:09 PM) CC: light headed, nausea   CC:  light headed and nausea.  History of Present Illness: Selena [name] is an 75 year old female, who comes in today accompanied by her daughter for evaluation of vertigo.  She states she had one episode of vertigo many years ago.  It only lasted for a short period of time and then went away.  She states she was well until Monday, August 1 she woke up went to get out of bed and developed severe vertigo.  It persisted, and finally, Wednesday afternoon, she went to urgent care for a shot of Phenergan because she was vomiting.  The vertigo then quieted down, but now comes and goes.  She is unable to lie flat.  If she turns her head to the right or the left or lies flat.  It triggers of vertigo.  If she sits in the upright position.  The vertigo stops.  Review of systems otherwise negative.  No hearing loss.  Current medications diclofenac 75 mg b.i.d. 181 mg baby aspirin daily.  BP 172/102.  However, she says her blood pressure at home is normal 140/70.  At urgent care they gave her some meclizine, but it's not helping  Allergies: 1)  ! Codeine Phosphate (Codeine Phosphate)  Past History:  Past medical, surgical, family and social histories (including risk factors) reviewed for relevance to current acute and chronic problems.  Past Medical History: Reviewed history from 12/26/2007 and no changes required. Diverticulosis, colon Osteoarthritis Elevated LFT's multi-nodular goiter history of gastritis, 1993 history benign 3-mm nodule right upper lobe (.  Chest CT 03-24-2006) paroxysmal atrial fibrillation and  ablation December 2008 mild cognitive impairment  Past Surgical  History: Reviewed history from 12/26/2008 and no changes required. Hysterectomy Rotator cuff repair Arthroscopic knee  colonoscopy March 2007 status post DC cardioversion December 2008 status post radiofrequency ablation in January 2009 stress Myoview January 2009  Family History: Reviewed history from 12/26/2007 and no changes required. father died of drowning death at age 8 mother died age 26, colon cancer 3 brothers positive for  colonic polyps 3 sisters, one died of renal cancer  Social History: Reviewed history from 12/27/2006 and no changes required. widowed, retired, remains active with golf  Review of Systems      See HPI  Physical Exam  General:  Well-developed,well-nourished,in no acute distress; alert,appropriate and cooperative throughout examination Head:  Normocephalic and atraumatic without obvious abnormalities. No apparent alopecia or balding. Eyes:  No corneal or conjunctival inflammation noted. EOMI. Perrla. Funduscopic exam benign, without hemorrhages, exudates or papilledema. Vision grossly normal. Ears:  External ear exam shows no significant lesions or deformities.  Otoscopic examination reveals clear canals, tympanic membranes are intact bilaterally without bulging, retraction, inflammation or discharge. Hearing is grossly normal bilaterally. Nose:  External nasal examination shows no deformity or inflammation. Nasal mucosa are pink and moist without lesions or exudates. Mouth:  Oral mucosa and oropharynx without lesions or exudates.  Teeth in good repair. Neurologic:  No cranial nerve deficits noted. Station and gait are normal. Plantar reflexes are down-going bilaterally. DTRs are symmetrical throughout. Sensory, motor and coordinative functions  appear intact.   Problems:  Medical Problems Added: 1)  Dx of Vertigo  (ICD-780.4)  Impression & Recommendations:  Problem # 1:  VERTIGO (ICD-780.4) Assessment New  Her updated medication list for this  problem includes:    Meclizine Hcl 25 Mg Tabs (Meclizine hcl) .[name]... Take one tab by mouth once daily  Complete Medication List: 1)  Diclofenac Sodium 75 Mg Tbec (Diclofenac sodium) .... One twice daily 2)  Adult Aspirin Ec Low Strength 81 Mg Tbec (Aspirin) .[name].. 1 once daily 3)  Meclizine Hcl 25 Mg Tabs (Meclizine hcl) .... Take one tab by mouth once daily  Patient Instructions: 1)  do not lie flat or turning y  head suddenly to the left or  the right.  Consider sleeping in a recliner or in a 45 degree angle for the next couple days until the vertigo stops. 2)  Check a morning blood pressure daily.  If your blood pressure is consistently 170/102.  Return to see Dr. Joyce Gross in one week.  If however, your blood pressure drops back to normal then, just return to see him p.r.n. 3)  If after a week or so.  The vertigo will not completely resolve, then called Dr. Kirtland Bouchard  and he will be to set up for an ENT consult

## 2010-02-23 NOTE — Assessment & Plan Note (Signed)
Summary: pt will come in fasting/njr   Vital Signs:  Patient profile:   75 year old female Height:      67 inches Weight:      212 pounds BMI:     33.32 Temp:     98.2 degrees F oral BP sitting:   100 / 72  (right arm) Cuff size:   regular  Vitals Entered By: Duard Brady LPN (December 29, 2009 9:27 AM) CC: cpx---doing fine Is Patient Diabetic? No   CC:  cpx---doing fine.  History of Present Illness: 89 -year-old patient who is seen today for a wellness exam;  she has had a recent full neurological evaluation due to vertigo.  She has a history of atrial fibrillation, status post ablation.  She has DJD history of mild cognitive impairment.  Today she is doing quite well.  Here for Medicare AWV:  1.   Risk factors based on Past M, S, F history:  heart vascular risk factors.  No major 2.   Physical Activities: no activity  restrictions; has been fairly inactive due to vertigo, but this now has stabilized 3.   Depression/mood: history depression, or mood disorder 4.   Hearing: wears a hearing bilaterally 5.   ADL's: independent in  all aspects of daily living 6.   Fall Risk: low 7.   Home Safety: no problems identified 8.   Height, weight, &visual acuity:height and weight stable.  No difficulty with visual acuity 9.   Counseling: weight loss, heart healthy diet and exercise all encouraged 10.   Labs ordered based on risk factors: laboratory studies will be reviewed 11.           Referral Coordination- will need follow-up colonoscopy in the spring 12.           Care Plan- exercise weight loss.  All encouraged 13.            Cognitive Assessment- patient has a remote cognitive impairment   Allergies: 1)  ! Codeine Phosphate (Codeine Phosphate)  Past History:  Past Medical History: Diverticulosis, colon Osteoarthritis Elevated LFT's multi-nodular goiter history of gastritis, 1993 history benign 3-mm nodule right upper lobe (.  Chest CT 03-24-2006) paroxysmal atrial  fibrillation and  ablation December 2008 mild cognitive impairment benign vertigo   Past Surgical History: Reviewed history from 12/26/2008 and no changes required. Hysterectomy Rotator cuff repair Arthroscopic knee  colonoscopy March 2007 status post DC cardioversion December 2008 status post radiofrequency ablation in January 2009 stress Myoview January 2009  Family History: Reviewed history from 12/26/2007 and no changes required. father died of drowning death at age 62 mother died age 64, colon cancer 3 brothers positive for  colonic polyps 3 sisters, one died of renal cancer  Social History: Reviewed history from 12/27/2006 and no changes required. widowed, retired, remains active with golf  Review of Systems  The patient denies anorexia, fever, weight loss, weight gain, vision loss, decreased hearing, hoarseness, chest pain, syncope, dyspnea on exertion, peripheral edema, prolonged cough, headaches, hemoptysis, abdominal pain, melena, hematochezia, severe indigestion/heartburn, hematuria, incontinence, genital sores, muscle weakness, suspicious skin lesions, transient blindness, difficulty walking, depression, unusual weight change, abnormal bleeding, enlarged lymph nodes, angioedema, and breast masses.    Physical Exam  General:  overweight-appearing.  100/70overweight-appearing.   Head:  Normocephalic and atraumatic without obvious abnormalities. No apparent alopecia or balding. Eyes:  No corneal or conjunctival inflammation noted. EOMI. Perrla. Funduscopic exam benign, without hemorrhages, exudates or papilledema. Vision grossly normal. Ears:  External ear exam shows no significant lesions or deformities.  Otoscopic examination reveals clear canals, tympanic membranes are intact bilaterally without bulging, retraction, inflammation or discharge. Hearing is grossly normal bilaterally. hearing aids bilaterally Mouth:  Oral mucosa and oropharynx without lesions or exudates.   dentures in place Neck:  No deformities, masses, or tenderness noted. Chest Wall:  No deformities, masses, or tenderness noted. Breasts:  No mass, nodules, thickening, tenderness, bulging, retraction, inflamation, nipple discharge or skin changes noted.   Lungs:  Normal respiratory effort, chest expands symmetrically. Lungs are clear to auscultation, no crackles or wheezes. Heart:  Normal rate and regular rhythm. S1 and S2 normal without gallop, murmur, click, rub or other extra sounds. Abdomen:  Bowel sounds positive,abdomen soft and non-tender without masses, organomegaly or hernias noted. Rectal:  No external abnormalities noted. Normal sphincter tone. No rectal masses or tenderness. Genitalia:  status post hysterectomy Msk:  No deformity or scoliosis noted of thoracic or lumbar spine.   Pulses:  R and L carotid,radial,femoral,dorsalis pedis and posterior tibial pulses are full and equal bilaterally Extremities:  No clubbing, cyanosis, edema, or deformity noted with normal full range of motion of all joints.   Neurologic:  diminished vibratory sensation involving her right distal lower extremity Skin:  Intact without suspicious lesions or rashes Cervical Nodes:  No lymphadenopathy noted Axillary Nodes:  No palpable lymphadenopathy Inguinal Nodes:  No significant adenopathy Psych:  Cognition and judgment appear intact. Alert and cooperative with normal attention span and concentration. No apparent delusions, illusions, hallucinations   Impression & Recommendations:  Problem # 1:  PREVENTIVE HEALTH CARE (ICD-V70.0)  Orders: EKG w/ Interpretation (93000) Medicare -1st Annual Wellness Visit [phone])  Problem # 2:  VERTIGO (ICD-780.4)  Her updated medication list for this problem includes:    Promethazine Hcl 50 Mg Tabs (Promethazine hcl) ..... One every 6 hours as for nausea, or vertigo  Her updated medication list for this problem includes:    Promethazine Hcl 50 Mg Tabs  (Promethazine hcl) ..... One every 6 hours as for nausea, or vertigo  Problem # 3:  ATRIAL FIBRILLATION (ICD-427.31)  Her updated medication list for this problem includes:    Adult Aspirin Ec Low Strength 81 Mg Tbec (Aspirin) .[name]... 1 once daily  Her updated medication list for this problem includes:    Adult Aspirin Ec Low Strength 81 Mg Tbec (Aspirin) .[name]... 1 once daily  Orders: Venipuncture ([address]) TLB-BMP (Basic Metabolic Panel-BMET) (80048-METABOL) TLB-CBC Platelet - w/Differential (85025-CBCD) TLB-Hepatic/Liver Function Pnl (80076-HEPATIC) TLB-Lipid Panel (80061-LIPID)  Problem # 4:  OSTEOARTHRITIS (ICD-715.90)  Her updated medication list for this problem includes:    Diclofenac Sodium 75 Mg Tbec (Diclofenac sodium) ..... One twice daily    Adult Aspirin Ec Low Strength 81 Mg Tbec (Aspirin) .[name]... 1 once daily  Her updated medication list for this problem includes:    Diclofenac Sodium 75 Mg Tbec (Diclofenac sodium) ..... One twice daily    Adult Aspirin Ec Low Strength 81 Mg Tbec (Aspirin) .[name]... 1 once daily  Orders: Venipuncture (09811) TLB-BMP (Basic Metabolic Panel-BMET) (80048-METABOL) TLB-CBC Platelet - w/Differential (85025-CBCD) TLB-Hepatic/Liver Function Pnl (80076-HEPATIC) TLB-Lipid Panel (80061-LIPID)  Complete Medication List: 1)  Diclofenac Sodium 75 Mg Tbec (Diclofenac sodium) .... One twice daily 2)  Adult Aspirin Ec Low Strength 81 Mg Tbec (Aspirin) .[name].. 1 once daily 3)  Promethazine Hcl 50 Mg Tabs (Promethazine hcl) .... One every 6 hours as for nausea, or vertigo  Patient Instructions: 1)  Please schedule a follow-up appointment  in 6 months. 2)  Limit your Sodium (Salt) to less than 2 grams a day(slightly less than 1/2 a teaspoon) to prevent fluid retention, swelling, or worsening of symptoms. 3)  It is important that you exercise regularly at least 20 minutes 5 times a week. If you develop chest pain, have severe difficulty breathing, or feel very  tired , stop exercising immediately and seek medical attention. 4)  You need to lose weight. Consider a lower calorie diet and regular exercise.  5)  Schedule a colonoscopy/sigmoidoscopy to help detect colon cancer. 6)  Take calcium +Vitamin D daily. Prescriptions: DICLOFENAC SODIUM 75 MG  TBEC (DICLOFENAC SODIUM) one twice daily  #180 x 6   Entered and Authorized by:   Gordy Savers  MD   Signed by:   Gordy Savers  MD on 12/29/2009   Method used:   Electronically to        [facility] [address] [phone]* (retail)       [address]       [place], [place]  [address]       Ph: [phone] or [phone]       Fax: [phone]   RxID:   [id number]    Orders Added: 1)  EKG w/ Interpretation [93000] 2)  Medicare -1st Annual Wellness Visit [G0438] 3)  Est. Patient Level III [66440] 4)  Venipuncture [36415] 5)  TLB-BMP (Basic Metabolic Panel-BMET) [80048-METABOL] 6)  TLB-CBC Platelet - w/Differential [85025-CBCD] 7)  TLB-Hepatic/Liver Function Pnl [80076-HEPATIC] 8)  TLB-Lipid Panel [80061-LIPID]  Appended Document: Orders Update    Clinical Lists Changes  Orders: Added new Service order of Specimen Handling (34742) - Signed

## 2010-02-23 NOTE — Assessment & Plan Note (Signed)
Summary: congestion//ccm   Vital Signs:  Selena [name] profile:   75 year old female Weight:      211 pounds O2 Sat:      96 % on Room air Temp:     98.5 degrees F oral BP sitting:   150 / 80  (right arm) Cuff size:   regular  Vitals Entered By: Duard Brady LPN (March 16, 2009 10:55 AM)  O2 Flow:  Room air CC: c/o congestion, tight cough, headache , diarrhea - OTC not helping Is Selena [name] Diabetic? No   CC:  c/o congestion, tight cough, headache , and diarrhea - OTC not helping.  History of Present Illness: 75 year old Selena [name] who is seen today for evaluation head and chest congestion, cough.  His been no sputum production, fever, shortness of breath or chest pain.  She has noted for the past 3 days occasional headache and diarrhea.  She was treated with antibiotic therapy approximately 3 weeks ago in urgent care.  She was well until 3 days ago, when she had the onset of the above complaints.  She has a history of zoster arthritis paroxysmal atrial fibrillation.  Preventive Screening-Counseling & Management  Alcohol-Tobacco     Smoking Status: quit  Allergies: 1)  ! Codeine Phosphate (Codeine Phosphate)  Past History:  Past Medical History: Reviewed history from 12/26/2007 and no changes required. Diverticulosis, colon Osteoarthritis Elevated LFT's multi-nodular goiter history of gastritis, 1993 history benign 3-mm nodule right upper lobe (.  Chest CT 03-24-2006) paroxysmal atrial fibrillation and  ablation December 2008 mild cognitive impairment  Social History: Smoking Status:  quit  Review of Systems       The Selena [name] complains of prolonged cough.  The Selena [name] denies anorexia, fever, weight loss, weight gain, vision loss, decreased hearing, hoarseness, chest pain, syncope, dyspnea on exertion, peripheral edema, headaches, hemoptysis, abdominal pain, melena, hematochezia, severe indigestion/heartburn, hematuria, incontinence, genital sores, muscle weakness,  suspicious skin lesions, transient blindness, difficulty walking, depression, unusual weight change, abnormal bleeding, enlarged lymph nodes, angioedema, and breast masses.    Physical Exam  General:  overweight-appearing.  no distress.  Frequent paroxysms of coughing.  Normal blood pressureoverweight-appearing.   Head:  Normocephalic and atraumatic without obvious abnormalities. No apparent alopecia or balding. Eyes:  No corneal or conjunctival inflammation noted. EOMI. Perrla. Funduscopic exam benign, without hemorrhages, exudates or papilledema. Vision grossly normal. Ears:  External ear exam shows no significant lesions or deformities.  Otoscopic examination reveals clear canals, tympanic membranes are intact bilaterally without bulging, retraction, inflammation or discharge. Hearing is grossly normal bilaterally. Mouth:  pharyngeal erythema.  pharyngeal erythema.   Neck:  No deformities, masses, or tenderness noted. Lungs:  Normal respiratory effort, chest expands symmetrically. Lungs are clear to auscultation, no crackles or wheezes. Heart:  Normal rate and regular rhythm. S1 and S2 normal without gallop, murmur, click, rub or other extra sounds. Abdomen:  Bowel sounds positive,abdomen soft and non-tender without masses, organomegaly or hernias noted.   Impression & Recommendations:  Problem # 1:  URI (ICD-465.9)  Her updated medication list for this problem includes:    Diclofenac Sodium 75 Mg Tbec (Diclofenac sodium) ..... One twice daily    Adult Aspirin Ec Low Strength 81 Mg Tbec (Aspirin) .[name]... 1 once daily    Hydrocodone-homatropine 5-1.5 Mg/42ml Syrp (Hydrocodone-homatropine) ..... One tsp every 6 hours for cough  Her updated medication list for this problem includes:    Diclofenac Sodium 75 Mg Tbec (Diclofenac sodium) ..... One twice daily    Adult  Aspirin Ec Low Strength 81 Mg Tbec (Aspirin) .[name]... 1 once daily    Hydrocodone-homatropine 5-1.5 Mg/24ml Syrp  (Hydrocodone-homatropine) ..... One tsp every 6 hours for cough  Problem # 2:  OSTEOARTHRITIS (ICD-715.90)  Her updated medication list for this problem includes:    Tramadol Hcl 50 Mg Tabs (Tramadol hcl) ..... One or two every 6 hours as needed for pain    Diclofenac Sodium 75 Mg Tbec (Diclofenac sodium) ..... One twice daily    Adult Aspirin Ec Low Strength 81 Mg Tbec (Aspirin) .[name]... 1 once daily    Hydrocodone-acetaminophen 5-500 Mg Tabs (Hydrocodone-acetaminophen) ..... One to two tablets by mouth q 6 hours as needed pain  Her updated medication list for this problem includes:    Tramadol Hcl 50 Mg Tabs (Tramadol hcl) ..... One or two every 6 hours as needed for pain    Diclofenac Sodium 75 Mg Tbec (Diclofenac sodium) ..... One twice daily    Adult Aspirin Ec Low Strength 81 Mg Tbec (Aspirin) .[name]... 1 once daily    Hydrocodone-acetaminophen 5-500 Mg Tabs (Hydrocodone-acetaminophen) ..... One to two tablets by mouth q 6 hours as needed pain  Complete Medication List: 1)  Tramadol Hcl 50 Mg Tabs (Tramadol hcl) .... One or two every 6 hours as needed for pain 2)  Diclofenac Sodium 75 Mg Tbec (Diclofenac sodium) .... One twice daily 3)  Aricept 5 Mg Tabs (Donepezil hcl) .... One daily 4)  Adult Aspirin Ec Low Strength 81 Mg Tbec (Aspirin) .[name].. 1 once daily 5)  Hyoscyamine Sulfate Cr 0.375 Mg Xr12h-cap (Hyoscyamine sulfate) .... One twice daily 6)  Hydrocodone-acetaminophen 5-500 Mg Tabs (Hydrocodone-acetaminophen) .... One to two tablets by mouth q 6 hours as needed pain 7)  Hydrocodone-homatropine 5-1.5 Mg/16ml Syrp (Hydrocodone-homatropine) .... One tsp every 6 hours for cough  Selena [name] Instructions: 1)  Get plenty of rest, drink lots of clear liquids, and use Tylenol or Ibuprofen for fever and comfort. Return in 7-10 days if you're not better:sooner if you're feeling worse. Prescriptions: HYDROCODONE-HOMATROPINE 5-1.5 MG/5ML SYRP (HYDROCODONE-HOMATROPINE) one tsp every 6 hours for cough   #6 oz x 0   Entered and Authorized by:   Gordy Savers  MD   Signed by:   Gordy Savers  MD on 03/16/2009   Method used:   Print then Give to Selena [name]   RxID:   [id number]

## 2010-02-23 NOTE — Progress Notes (Signed)
Summary: REQ FOR EARLIER APPT / NEURO EVALUATION  Phone Note Call from Patient   Caller: Daughter  [phone] Summary of Call: Pts daughter Laguana Desautel) called to adv that  her mother's condition is worsening.... Pt exp sxs of syncopal episodes (unknown length of time), dizziness that cause unsteady gait.... Pt lives alone and the family is worried about pts condition.... Adv they called [facility] and pts appt was moved up to Sept. 20 but pts family doesn't feel they can wait that long and are concerned with pt having to wait that long since her condition seems to be worsening..... Can you advise referral to another Neurologist that may be able to get pt in sooner or perhaps someone from office could call to see if pt can be worked in sooner?  Adaley Kiene can be reached at [phone] with any questions or concerns.  Initial call taken by: Debbra Riding,  September 30, 2009 9:27 AM  Follow-up for Phone Call        OK to schedule with another Neurologist if appt can be obtained earlier Follow-up by: Gordy Savers  MD,  September 30, 2009 9:42 AM  Additional Follow-up for Phone Call Additional follow up Details #1::        There is no other Neuro that can see pt sooner than 9/20.  I called [facility] and they have nothing sooner. They recommended Dr. Kirtland Bouchard to call Dr. Anne Hahn to see if he will see pt sooner. Sent flag to Dr. Kirtland Bouchard. Additional Follow-up by: Corky Mull,  September 30, 2009 11:19 AM     Appended Document: Orders Update    Clinical Lists Changes  Orders: Added new Referral order of ENT Referral (ENT) - Signed

## 2010-05-10 LAB — URINE CULTURE

## 2010-05-10 LAB — BASIC METABOLIC PANEL
BUN: 9 mg/dL (ref 6–23)
CO2: 24 mEq/L (ref 19–32)
CO2: 25 mEq/L (ref 19–32)
Calcium: 8.4 mg/dL (ref 8.4–10.5)
Calcium: 8.6 mg/dL (ref 8.4–10.5)
Calcium: 8.9 mg/dL (ref 8.4–10.5)
Chloride: 102 mEq/L (ref 96–112)
Creatinine, Ser: 0.85 mg/dL (ref 0.4–1.2)
GFR calc Af Amer: >60 mL/min (ref >60)
GFR calc Af Amer: >60 mL/min (ref >60)
GFR calc non Af Amer: >60 mL/min (ref >60)
GFR calc non Af Amer: >60 mL/min (ref >60)
Glucose, Bld: 136 mg/dL — ABNORMAL HIGH (ref 70–99)
Glucose, Bld: 145 mg/dL — ABNORMAL HIGH (ref 70–99)
Potassium: 3.8 mEq/L (ref 3.5–5.1)
Potassium: 4.2 mEq/L (ref 3.5–5.1)
Sodium: 137 mEq/L (ref 135–145)

## 2010-05-10 LAB — URINALYSIS, ROUTINE W REFLEX MICROSCOPIC
Bilirubin Urine: NEGATIVE
Glucose, UA: NEGATIVE mg/dL
Ketones, ur: NEGATIVE mg/dL
Protein, ur: NEGATIVE mg/dL

## 2010-05-10 LAB — CBC
HCT: 28.1 % — ABNORMAL LOW (ref 36.0–46.0)
HCT: 31.8 % — ABNORMAL LOW (ref 36.0–46.0)
Hemoglobin: 11.8 g/dL — ABNORMAL LOW (ref 12.0–15.0)
Hemoglobin: 9.5 g/dL — ABNORMAL LOW (ref 12.0–15.0)
MCHC: 33.5 g/dL (ref 30.0–36.0)
MCHC: 33.9 g/dL (ref 30.0–36.0)
MCV: 94.7 fL (ref 78.0–100.0)
Platelets: 169 10*3/uL (ref 150–400)
RBC: 2.96 MIL/uL — ABNORMAL LOW (ref 3.87–5.11)
RBC: 3.69 MIL/uL — ABNORMAL LOW (ref 3.87–5.11)
RDW: 12.9 % (ref 11.5–15.5)
RDW: 13.4 % (ref 11.5–15.5)
WBC: 13.7 10*3/uL — ABNORMAL HIGH (ref 4.0–10.5)

## 2010-05-10 LAB — PROTIME-INR
Prothrombin Time: 14.8 seconds (ref 11.6–15.2)
Prothrombin Time: 18.1 seconds — ABNORMAL HIGH (ref 11.6–15.2)

## 2010-05-24 ENCOUNTER — Ambulatory Visit (INDEPENDENT_AMBULATORY_CARE_PROVIDER_SITE_OTHER): Payer: Medicare Other | Admitting: Internal Medicine

## 2010-05-24 ENCOUNTER — Encounter: Payer: Self-pay | Admitting: Internal Medicine

## 2010-05-24 ENCOUNTER — Ambulatory Visit (INDEPENDENT_AMBULATORY_CARE_PROVIDER_SITE_OTHER)
Admission: RE | Admit: 2010-05-24 | Discharge: 2010-05-24 | Disposition: A | Discharge status: Home / Self Care | Payer: Medicare Other | Source: Ambulatory Visit | Attending: Internal Medicine | Admitting: Internal Medicine

## 2010-05-24 DIAGNOSIS — J069 Acute upper respiratory infection, unspecified: Secondary | ICD-10-CM

## 2010-05-24 DIAGNOSIS — M1990 Unspecified osteoarthritis, unspecified site: Secondary | ICD-10-CM

## 2010-05-24 DIAGNOSIS — I4891 Unspecified atrial fibrillation: Secondary | ICD-10-CM

## 2010-05-24 DIAGNOSIS — R0602 Shortness of breath: Secondary | ICD-10-CM

## 2010-05-24 NOTE — Progress Notes (Signed)
  Subjective:    Patient ID: Selena [name], female    DOB: 1935/02/14, 75 y.o.   MRN: [id number]  HPI 75 year old patient who has a history of paroxysmal atrial for ablation. She has a history of hypertension but presently controlled off medication. She has osteoarthritis. For the past 10 days she has had chest congestion cough dizziness weakness and exertional shortness of breath. She was seen at an urgent care approximately 9 days ago and treated with a cough medicine. This improved the cough that may have caused excessive sedation. She has been weak. She attempted to play golf 4 days ago. No fever or chills.    Review of Systems  Constitutional: Positive for fatigue.  HENT: Positive for congestion. Negative for hearing loss, sore throat, rhinorrhea, dental problem, sinus pressure and tinnitus.   Eyes: Negative for pain, discharge and visual disturbance.  Respiratory: Positive for cough and shortness of breath.   Cardiovascular: Negative for chest pain, palpitations and leg swelling.  Gastrointestinal: Negative for nausea, vomiting, abdominal pain, diarrhea, constipation, blood in stool and abdominal distention.  Genitourinary: Negative for dysuria, urgency, frequency, hematuria, flank pain, vaginal bleeding, vaginal discharge, difficulty urinating, vaginal pain and pelvic pain.  Musculoskeletal: Negative for joint swelling, arthralgias and gait problem.  Skin: Negative for rash.  Neurological: Negative for dizziness, syncope, speech difficulty, weakness, numbness and headaches.  Hematological: Negative for adenopathy.  Psychiatric/Behavioral: Negative for behavioral problems, dysphoric mood and agitation. The patient is not nervous/anxious.        Objective:   Physical Exam  Constitutional: She is oriented to person, place, and time. She appears well-developed and well-nourished. No distress.       Overweight. Temperature 97.9. O2 saturation 96%  HENT:  Head: Normocephalic.  Right  Ear: External ear normal.  Left Ear: External ear normal.  Mouth/Throat: Oropharynx is clear and moist.  Eyes: Conjunctivae and EOM are normal. Pupils are equal, round, and reactive to light.  Neck: Normal range of motion. Neck supple. No thyromegaly present.  Cardiovascular: Normal rate, regular rhythm, normal heart sounds and intact distal pulses.   Pulmonary/Chest: Effort normal and breath sounds normal. No respiratory distress. She has no wheezes. She has no rales.  Abdominal: Soft. Bowel sounds are normal. She exhibits no mass. There is no tenderness.  Musculoskeletal: Normal range of motion.  Lymphadenopathy:    She has no cervical adenopathy.  Neurological: She is alert and oriented to person, place, and time.  Skin: Skin is warm and dry. No rash noted.  Psychiatric: She has a normal mood and affect. Her behavior is normal.          Assessment & Plan:   Viral URI Dyspnea on exertion. Probably aggravated by URI. Will check a chest x-ray  We'll treat symptomatically

## 2010-05-24 NOTE — Progress Notes (Signed)
Quick Note:  Spoke with pt - informed of results. KIK ______ 

## 2010-05-24 NOTE — Patient Instructions (Signed)
Get plenty of rest, Drink lots of  clear liquids, and use Tylenol or ibuprofen for fever and discomfort.    Mucinex DM-  twice daily  Chest x-ray  Call or return to clinic prn if these symptoms worsen or fail to improve as anticipated.

## 2010-06-02 ENCOUNTER — Ambulatory Visit (AMBULATORY_SURGERY_CENTER): Payer: Medicare Other | Admitting: *Deleted

## 2010-06-02 ENCOUNTER — Encounter: Payer: Self-pay | Admitting: Gastroenterology

## 2010-06-02 VITALS — Ht 68.0 in | Wt 209.0 lb

## 2010-06-02 DIAGNOSIS — Z800 Family history of malignant neoplasm of digestive organs: Secondary | ICD-10-CM

## 2010-06-02 MED ORDER — PEG-KCL-NACL-NASULF-NA ASC-C 100 G PO SOLR: ORAL | Status: DC

## 2010-06-08 NOTE — Assessment & Plan Note (Signed)
[facility]                         ELECTROPHYSIOLOGY OFFICE NOTE   NAME:Becherer, ADDASYN MCBREEN                       MRN:          [id number]  DATE:03/12/2007                            DOB:          Apr 19, 1935    Mrs. Selena [name] is seen following ablation of her atrial flutter back in  the middle of January.  She has done well.  She is feeling better than  she has in a long, long time.   She is currently taking no medications, but she will go back on her  aspirin.   PHYSICAL EXAMINATION:  Her blood pressure was mildly elevated today at  150/70.  Her pulse was 69.  LUNGS:  Clear.  HEART:  Sounds were regular.  EXTREMITIES:  Without edema.  SKIN:  Warm and dry.   ELECTROCARDIOGRAM:  Dated today demonstrated sinus rhythm with intervals  of 0.18/0.06/0.4.  The axis was 30 degrees.   IMPRESSION:  1. Atrial flutter.  2. Hypertension, question isolated.   Ms. Selena [name] is doing well following catheter ablation of her atrial  flutter.  We will have her follow up with Dr. Amador Cunas.   I have also asked her to follow up her blood pressure.  She has a blood  pressure machine at home and she is to track it.  If she ends up with  persistent problems with it being elevated, she is to call Dr.  Amador Cunas.     Duke Salvia, MD, [facility]  Electronically Signed    SCK/MedQ  DD: 03/12/2007  DT: 03/13/2007  Job #: [id number]   cc:   Gordy Savers, MD

## 2010-06-08 NOTE — H&P (Signed)
Selena [name], Selena [name]                ACCOUNT NO.:  [id number]   MEDICAL RECORD NO.:  [id number]          PATIENT TYPE:  INP   LOCATION:  2024                         FACILITY:  [facility]   PHYSICIAN:  Bruce R. Juanda Chance, MD, FACCDATE OF BIRTH:  [birth year]   DATE OF ADMISSION:  12/27/2006  DATE OF DISCHARGE:                              HISTORY & PHYSICAL   PRIMARY CARE PHYSICIAN AND REFERRING PHYSICIAN:  Dr. Eleonore Chiquito.   REASON FOR CONSULTATION:  Evaluation and management of atrial flutter.   CLINICAL HISTORY:  Selena [name] is 75 years old and has had no prior  history of known heart disease.  She saw Dr. Amador Cunas today for a  yearly physical examination and was found to be in atrial flutter.  She  subsequently gave a history of having palpitations over the past 2  weeks.  She has had no chest pain or shortness of breath.   Dr. Amador Cunas admitted her to [facility] and treated her with IV  Cardizem and IV heparin and started Coumadin.  Her rate was initially in  the 120 range, and now has decreased to the 80s.   PAST MEDICAL HISTORY:  Significant for diverticulosis of the colon.  She  has osteoarthritis and is status post total knee replacement.  She has a  history of gastritis.   MEDICATIONS:  Her medications at home include aspirin, diclofenac.   SOCIAL HISTORY:  She is widowed and retired.  She lives alone, but she  has been quite active and drives and plays golf.   FAMILY HISTORY:  Positive in that her mother died at age 59 for colon  cancer.  There is no cardiac history in the family.   REVIEW OF SYSTEMS:  Positive for symptoms of osteoarthritis.   PHYSICAL EXAMINATION:  On examination the blood pressure was 130  systolic, and the pulse rate 120 and 85.  Venous pulsation was visible just at the clavicle.  The clavicles were  full, and there were no bruits.  Chest was clear without rales or rhonchi.  Cardiac rhythm was irregular.  I could hear no murmurs, gallops,  or  clicks.  The abdomen was soft with normal bowel sounds.  There is no  hepatosplenomegaly.  Peripheral pulses were full, and there was no peripheral edema.  Musculoskeletal system showed no deformities.  The skin was warm and dry.  Neurological examination showed no focal neurological signs.   DATA:  An electrocardiogram showed atrial flutter with typical sawtooth  flutter waves.  There were nonspecific ST-T changes.   IMPRESSION:  Recent onset atrial flutter.   RECOMMENDATIONS:  I agree with Dr. Vernon Prey treatment with IV  Cardizem, IV heparin, and beginning Coumadin.  She has had symptoms for  2 weeks, so she will need a transesophageal echo in order to proceed  with early cardioversion.  We will plan to schedule a transesophageal  cardioversion for tomorrow with anesthesia and endoscopy.  We will  obtain EP consultation, and we will probably recommend atrial flutter  ablation electively.      Bruce Elvera Lennox Juanda Chance, MD, [facility]  Electronically Signed  BRB/MEDQ  D:  12/27/2006  T:  12/28/2006  Job:  [id number]   cc:   Gordy Savers, MD  Everardo Beals. Juanda Chance, MD, [facility]

## 2010-06-08 NOTE — Assessment & Plan Note (Signed)
[facility]                         ELECTROPHYSIOLOGY OFFICE NOTE   NAME:Selena [name], [name]                       MRN:          [id number]  DATE:01/29/2007                            DOB:          1935-09-09    Ms. Solt was seen following admission in mid-December, where she  presented with atrial flutter.  Because of lab constraints, she  underwent TEE-guided cardioversion and she has been therapeutic since  that time.   We discussed treatment options including catheter ablation.  We  discussed the risks and benefits as well as the potential risks,  including but not limited to death, perforation and heart block  requiring pacemaker implantation.  She would like to proceed with that.  We will plan to discontinue her Coumadin today.   EXAMINATION:  Blood pressure was 126/78, her pulse was 78.  LUNGS:  Clear.  Heart sounds were regular.  Neck veins were flat.  EXTREMITIES:  Without edema.  SKIN:  Warm and dry.   The plan will be to proceed with catheter ablation off Coumadin in the  next week or so.     Duke Salvia, MD, [facility]  Electronically Signed    SCK/MedQ  DD: 01/29/2007  DT: 01/30/2007  Job #: [id number]

## 2010-06-08 NOTE — Op Note (Signed)
NAMEBRINLY, Selena [name]                ACCOUNT NO.:  [id number]   MEDICAL RECORD NO.:  [id number]          PATIENT TYPE:  INP   LOCATION:  5016                         FACILITY:  [facility]   PHYSICIAN:  Loreta Ave, M.D. DATE OF BIRTH:  Aug 24, 1935   DATE OF PROCEDURE:  01/30/2008  DATE OF DISCHARGE:                               OPERATIVE REPORT   PREOPERATIVE DIAGNOSIS:  Right knee end-stage degenerative arthritis,  valgus alignment.   POSTOPERATIVE DIAGNOSIS:  Right knee end-stage degenerative arthritis,  valgus alignment.   PROCEDURE:  Right total knee replacement with Stryker Triathlon  prosthesis.  Modified minimally invasive approach.  Soft tissue  balancing.  Cemented pegged posterior stabilized #4 femoral component.  Cemented #4 tibial component with an 11-mm polyethylene insert.  Cemented pegged medial offset resurfacing 35-mm patellar component.   SURGEON:  Loreta Ave, MD   ASSISTANT:  Genene Churn. Barry Dienes, [place], present throughout the entire case and  necessary for timely completion of the procedure.   ANESTHESIA:  General.   ESTIMATED BLOOD LOSS:  Minimal.   SPECIMENS:  None.   COUNTS:  None.   COMPLICATIONS:  None.   DRESSINGS:  Sterile compressive with knee immobilizer.   DRAIN:  Hemovac x1.   TOURNIQUET TIME:  1 hour and 10 minutes.   PROCEDURE:  The patient brought to the operating room, placed on the  operating table in supine position.  After adequate anesthesia had been  obtained, knee examined.  Full extension, greater than 100 degrees of  flexion.  Increased valgus but reasonably correctable.  No flexion  contracture.  Tourniquet applied.  Prepped and draped in the usual  sterile fashion.  Exsanguinated with an elevation Esmarch, tourniquet  inflated to 350 mmHg.  A straight incision above the patella down to the  tibial tubercle.  Medial arthrotomy with vastus splitting at the top  preserving quad tendon.  Knee exposed.  Periarticular spurs  removed.  Medial capsule released.  Distal femur exposed.  Intramedullary guide.  A 10-mm resection set at 5 degrees of valgus.  Spurs removed.  Epicondylar axis marked.  Size cut and fitted for a posterior stabilized  #4 component.  Proximal tibia exposed.  Extramedullary guide placed.  A  3-degree posterior slope cut.  Cut made below the defect laterally.  Sized to #4 component.  After soft tissue balancing and capsule release  on both sides, trials put in place.  A #4 on the femur, #4 on the tibia.  With an 11-mm insert, nicely balanced the knee with full extension, full  flexion.  Patella exposed.  Posterior 10 mm removed with a saw.  Size  drilled and fitted for a 35-mm component.  Excellent tracking with  trials.  After the tibia was marked for rotation, trials removed.  Tibia  was hand punched.  Copious irrigation with pulse irrigating device.  Cement prepared, placed on all components, which were firmly seated.  Polyethylene attached to the tibia and the knee reduced.  Once the  cement hardened, it was reexamined.  Full extension, full flexion, good  alignment, good stability, good tracking.  Wound irrigated.  Hemovac  placed through a separate stab wound.  Arthrotomy closed with #1 Vicryl.  Skin and subcutaneous tissue with Vicryl and Staples.  Knee injected  with Marcaine.  Hemovac clamped.  Sterile compressive dressing applied.  Tourniquet deflated and removed.  Knee immobilizer applied.  Anesthesia  reversed.  Brought to the recovery room.  Tolerated the surgery well.  No complications.      Loreta Ave, M.D.  Electronically Signed     DFM/MEDQ  D:  01/30/2008  T:  01/31/2008  Job:  [id number]

## 2010-06-08 NOTE — Discharge Summary (Signed)
Selena [name], Selena [name]                ACCOUNT NO.:  [id number]   MEDICAL RECORD NO.:  [id number]          PATIENT TYPE:  INP   LOCATION:  2024                         FACILITY:  [facility]   PHYSICIAN:  Gordy Savers, MDDATE OF BIRTH:  [birth year]   DATE OF ADMISSION:  12/27/2006  DATE OF DISCHARGE:  12/29/2006                               DISCHARGE SUMMARY   DISCHARGE DIAGNOSES:  1. New onset atrial flutter with rapid ventricular response, status      post direct current cardioversion performed December 28, 2006 by Dr.      Arvilla Meres.  2. Osteoarthritis.   HISTORY OF PRESENT ILLNESS:  Selena [name] is a 75 year old female who was  admitted on December 27, 2006 with chief complaint of heart beating out  of chest.  She has no previous history of cardiac disease, but had  noted occasional palpitations with episodes of rapid forceful heart  beat.  She denied any associated chest pain or dyspnea.  She was  evaluated for her annual exam 1 year prior to this admission.  At that  time, also complained of dyspnea on exertion which prompted a Cardiolite  stress test performed at that time which was normal.  She was noted to  be in atrial flutter with a ventricular rate of approximately 120 and  was admitted for further evaluation and treatment.   PAST MEDICAL HISTORY:  1. Diverticulosis.  2. Osteoarthritis.  3. Elevated LFTs.  4. Multinodular goiter.  5. History of gastritis 1993.  6. History of benign 3 mm nodule right upper lobe.   HOSPITAL COURSE:  Atrial flutter.  The patient was admitted was placed  on IV heparin.  The patient was also started on Coumadin.  She was seen  in consultation by [facility] initially by Dr. Sherryl Manges.  She subsequently underwent cardioversion on December 28, 2006 which was  performed by Dr. Arvilla Meres.  The patient was seen by Dr. Sherryl Manges during this admission.  The plan is for her to be seen in the  office the first week in  January with tentative plans for ablation the  second week in January.  The plan is for the patient to continue  Coumadin therapy until that time.   DISCHARGE MEDICATIONS:  1. Coumadin 7.5 mg p.o. daily in the evening.  2. Lovenox 90 mg subcu. injection q.12 h., 100 mg syringes will be      dispensed.  3. Diclofenac 75 mg p.o. b.i.d.   PERTINENT LABORATORY DATA:  At time of discharge, INR is 1.2, hemoglobin  14.3, hematocrit 42.1.   DISPOSITION:  The patient will be discharged to home.   FOLLOW UP:  1. The patient is scheduled to follow up with Dr. Eleonore Chiquito on      Monday, January 01, 2007 at 11 a.m.  2. She is to follow up with Dr. Sherryl Manges the first week in      January.      Sandford Craze, NP      Gordy Savers, MD  Electronically Signed    MO/MEDQ  D:  12/29/2006  T:  12/29/2006  Job:  [id number]   cc:   Duke Salvia, MD, [facility]

## 2010-06-08 NOTE — Consult Note (Signed)
NAMENATSUMI, WHITSITT                ACCOUNT NO.:  [id number]   MEDICAL RECORD NO.:  [id number]          PATIENT TYPE:  INP   LOCATION:  2024                         FACILITY:  [facility]   PHYSICIAN:  Duke Salvia, MD, FACCDATE OF BIRTH:  05-07-1935   DATE OF CONSULTATION:  12/28/2006  DATE OF DISCHARGE:                                 CONSULTATION   Thank you very much for asking [place] to see Selena [name] on  electrophysiological consultation for management of atrial flutter.   Selena [name] is a 75 year old woman with no prior known cardiac history  who without prior symptoms, presented herself to her primary physician  yesterday for her annual physical.  She was found to be tachycardic and  electrocardiography demonstrated atrial flutter that was typical with  2:1 block.   The patient had noted no change in her functional status.  There is no  peripheral edema.  She had noted occasional drums thumping in her  chest over the last couple of weeks primarily noted while she was  recumbent.   Her thromboembolic risk factors are negative for hypertension, diabetes,  stroke, heart failure, age.   PAST MEDICAL HISTORY:  1. Diverticulosis.  2. Osteoarthritis.  3. Gastritis.  4. Multinodular goiter.  5. Right upper lung nodule thought to be benign.   PAST SURGICAL HISTORY:  1. Notable for hysterectomy.  2. Rotator cuff surgery.  3. Arthroscopic knee surgery and apparently a new replacement.   SOCIAL HISTORY:  She is widowed and retired.  She has 2 children.  She  has 4 grandchildren.  She remains quite active playing golf.   FAMILY HISTORY:  Noncontributory.   REVIEW OF SYSTEMS:  Noted on previous workups and from the office from  Dr. Bing Matter and is broadly negative.   PHYSICAL EXAMINATION:  GENERAL:  On examination, she is an elderly  Caucasian female appearing considerably older than her stated age of 51.  VITAL SIGNS:  Her blood pressure is 120/70.  Her pulse was 70 and  regular.  HEENT:  Demonstrated no icterus or xanthoma.  NECK:  Veins were flat.  Carotids are brisk and full bilaterally without  bruits.  BACK:  Without kyphosis or scoliosis.  LUNGS:  Clear.  HEART:  Sounds were regular without murmurs or gallops.  ABDOMEN:  Soft with active bowel sounds without midline pulsation or  hepatomegaly.  EXTREMITIES:  Femoral pulses were 2+.  Distal pulses were  intact.  There is no clubbing, cyanosis or edema.  NEUROLOGICAL:  Grossly normal.  SKIN:  Warm and dry.  MUSCULOSKELETAL:  No significant deformities.   DIAGNOSTICS:  Electrocardiogram dated yesterday demonstrated atrial  flutter with atrial cycle length of approximately 220 milliseconds.  There was 3:1 conduction resulting in a rate of 95.   IMPRESSION:  1. Atrial flutter - typical with unusual 3:1 conduction pattern.  The      patient underwent TE cardioversion this morning with normal left      ventricular function and no left atrial appendage thrombus.  Sinus      rhythm was restored.  2. Thromboembolic risk factors  are broadly negative.  3. Musculoskeletal disease as noted previously.   DISCUSSION:  Selena [name] has atrial flutter, it is invariably  recurrent.  A [place] score of zero while low enough to not prompt the use  of Coumadin given its own issues is associated with a published stroke  risk of about 1.8%.  The patient, her daughter-in-law and I discussed  this as well as potential benefits of anticoagulation versus potential  benefits of catheter ablation with elimination of the substrate for the  patient's clinical arrhythmia and presumably thereby the associated risk  of thromboembolism.   We will plan to let her go home.  We will schedule her to be seen in the  office the first week in January and then we will tentatively set her up  for ablation in the second week in January having discontinued her  Coumadin in anticipation of that procedure.   Thank you for the  consultation.      Duke Salvia, MD, [facility]  Electronically Signed     SCK/MEDQ  D:  12/28/2006  T:  12/28/2006  Job:  [id number]   cc:   Everardo Beals. Juanda Chance, MD, [facility]  [facility]  [facility]

## 2010-06-08 NOTE — Op Note (Signed)
NAMEDEDRA, Selena [name]                ACCOUNT NO.:  [id number]   MEDICAL RECORD NO.:  [id number]          PATIENT TYPE:  INP   LOCATION:  2024                         FACILITY:  [facility]   PHYSICIAN:  Bevelyn Buckles. Bensimhon, MDDATE OF BIRTH:  Oct 03, 1935   DATE OF PROCEDURE:  12/28/2006  DATE OF DISCHARGE:                               OPERATIVE REPORT   REPORT TITLE:  Direct Current Cardioversion Report   PATIENT IDENTIFICATION:  Selena [name] is a delightful 75 year old woman  with no known history of cardiac disease.  She was found to have atrial  flutter at a routine doctor checkup.  She was admitted and rate  controlled and started on anticoagulation.  She is now referred for TEE-  guided cardioversion.   DESCRIPTION OF PROCEDURE:  The patient underwent transesophageal  echocardiogram which showed normal ejection fraction, EF of 55% with  mild mitral regurgitation.  There was no left atrial appendage thrombus.   After a TEE was completed, Dr. Krista Blue from anesthesiology sedated her  with 100 mg of sodium Pentothal.  When adequate sedation was achieved,  she then received a single 100-joule synchronized biphasic shock with  prompt conversion to sinus rhythm.  She will be continued on heparin and  loaded with Coumadin and be evaluated by EP in the future for possible  atrial flutter ablation.      Bevelyn Buckles. Bensimhon, MD  Electronically Signed     DRB/MEDQ  D:  12/28/2006  T:  12/28/2006  Job:  [id number]

## 2010-06-08 NOTE — Op Note (Signed)
NAMECERRA, Selena [name]                ACCOUNT NO.:  [id number]   MEDICAL RECORD NO.:  [id number]          PATIENT TYPE:  OIB   LOCATION:  2853                         FACILITY:  [facility]   PHYSICIAN:  Duke Salvia, MD, FACCDATE OF BIRTH:  18-Mar-1935   DATE OF PROCEDURE:  02/08/2007  DATE OF DISCHARGE:                               OPERATIVE REPORT   PREOPERATIVE DIAGNOSIS:  Atrial flutter.   POSTOPERATIVE DIAGNOSIS:  Atrial flutter.   OPERATIONS:  Invasive electrophysiological study, arrhythmia mapping and  catheter ablation.   DESCRIPTION OF PROCEDURE:  After obtaining informed consent, the patient  was brought to the electrophysiology laboratory and placed on the  fluoroscopic table in the supine position.  After routine prep and  drape, cardiac catheterization was performed with local anesthesia and  conscious sedation.  Noninvasive blood pressure monitoring,  transcutaneous oxygen saturation monitoring and end tidal CO2 monitoring  were performed continuously throughout the procedure.  Following the  procedure, the catheters were removed, the sheaths were secured and the  patient was transferred to the holding area for sheath removal.   Surface lead 1, aVF and V1 were monitored continuously throughout the  procedure.  Following insertion of the catheter, the stimulation  protocol included incremental atrial pacing, incremental ventricular  pacing, single atrial extra stimuli.   RESULTS:  1. Surface electrocardiogram.  Initial:  Sinus; RR interval, 886 milliseconds; PR interval 208  milliseconds; QRS duration 101 milliseconds; QT interval 423  milliseconds; P wave duration 114 milliseconds; AH interval 105  milliseconds; HV interval 45 milliseconds.  1. Final.  Rhythm:  Sinus; RR interval 960 milliseconds; PR interval 210  milliseconds; QRS duration 112 milliseconds; QT interval 370  milliseconds; P wave duration 159 milliseconds; bundle branch block  absent; AH interval 166  milliseconds; HV interval 49 milliseconds.  1. AV nodal function.  AV Wenckebach was 380 milliseconds.  VA conduction was disassociated at  600 milliseconds.  AV nodal __________  600 milliseconds with 320 milliseconds of  conduction was continuous.  1. Accessory pathway function:  No evidence of accessory pathway was      identified.  2. Ventricular stimulation:  Normal for ventricular stimulation as      described previously.  3. Catheter ablation:  Radiofrequency energy was delivered across the      cava tricuspid isthmus resulting in interruption of conduction      bidirectionally across the isthmus with an AA1 A2 interval of 880      milliseconds.  A total of 5 minutes and 51 seconds of RF energy was      applied at 5 o'clock on the cava tricuspid isthmus.  4. Fluoroscopy time:  A total of 4.1 minutes of fluoroscopy time was      utilized at 7 and 1/2 frames per second.   IMPRESSION:  1. Normal sinus function.  2. Abnormal atrial function manifested by sustained atrial-typical      flutter.  Catheter ablation successfully interrupted cava tricuspid      isthmus conduction and thereby eliminated the substrate from the      patient's clinical arrhythmia.  3. Normal AV nodal function.  4. Interval His-Purkinje system function.  5. No accessory pathway.  6. Normal ventricular response to programmed stimulation.   SUMMARY OF CONCLUSION:  Results of electrophysiological testing  confirmed cava tricuspid isthmus conduction the presumptive substrate  from the patient's typical atrial flutter.  Catheter ablation across the  cava tricuspid isthmus interrupted conduction bidirectionally and  thereby the substrate.  The patient tolerated the procedure well without  apparent complication.  The patient was transferred to the holding area  as noted previously for sheath removal.      Duke Salvia, MD, [facility]  Electronically Signed     SCK/MEDQ  D:  02/08/2007  T:  02/08/2007  Job:   [id number]   cc:   Gordy Savers, MD

## 2010-06-08 NOTE — Discharge Summary (Signed)
Selena [name], Selena [name]                ACCOUNT NO.:  [id number]   MEDICAL RECORD NO.:  [id number]          PATIENT TYPE:  INP   LOCATION:  5016                         FACILITY:  [facility]   PHYSICIAN:  Loreta Ave, M.D. DATE OF BIRTH:  [birth year]   DATE OF ADMISSION:  01/30/2008  DATE OF DISCHARGE:  02/02/2008                               DISCHARGE SUMMARY   FINAL DIAGNOSES:  1. Status post right total knee replacement for end-stage degenerative      joint disease.  2. History of atrial flutter.  3. Right upper lung nodule.  4. Hypertension.   HISTORY OF PRESENT ILLNESS:  A 75 year old white female with history of  end-stage DJD right knee and chronic pain, presented to our office for  preoperative evaluation for total knee replacement.  She had a  progressive worsening pain with no response with conservative treatment.  Significant decrease in her daily activities due to the ongoing  complaint.   HOSPITAL COURSE:  On January 30, 2008, the patient was taken to the [facility] OR, and a right total knee replacement procedure performed.   SURGEON:  Mckinley Jewel, MD   ASSISTANT:  Zonia Kief, PA-C   ANESTHESIA:  General.   No specimens.   ESTIMATED BLOOD LOSS:  Minimal.   TOURNIQUET TIME:  70 minutes.   One Hemovac drain placed.   There were no surgical or anesthesia complications, and the patient was  transferred to recovery in stable condition.   Pharmacy protocol Coumadin started.  On January 31, 2008, the patient was  doing well with good pain control.  No complaints.  Vital signs stable,  afebrile.  Dressing clean, dry, intact.  Calf nontender and  neurovascularly intact.  Discontinued morphine PCA.  PT/OT consults.  On  February 01, 2008, the patient had complaint of itching from Percocet.  Denied chest pain or shortness of breath.  Vital signs stable, afebrile.  Hemoglobin 10.6 and hematocrit 31.8.  Sodium 140, potassium 4.2,  chloride 107, CO2 of 25, BUN 9,  creatinine 0.75, and glucose 145.  INR  1.3.  Wound looks good.  Staples intact.  No drainage or signs of  infection.  Calf nontender and neurovascularly intact.  Discontinued  Foley and saline lock IV.  Discontinued Percocet and started Dilaudid 2  mg 1 tablet p.o. q.6-8 h. p.r.n. for pain.  WBC 15.6.  UA checked.  On  January 2010, the patient was doing great.  Good pain control.  The  patient states that she had a short run of palpitations the previous  evening.  Now resolved.  Denied chest pain or shortness of breath.  Temperature 98.7, pulse 81, respirations 20, and blood pressure 131/70.  WBC 13.7, hemoglobin 9.5, hematocrit 28.1, and platelets 170.  Sodium  137, potassium 3.8, chloride 104, CO2 of 26, BUN 13, creatinine 0.70,  and glucose 112.  INR 1.4.  UA negative.  Right knee wound looks good.  Staples intact.  No drainage or signs of infection.  Calf nontender and  neurovascularly intact.  The patient is alert and oriented x3.  No acute  distress.  Palpitations resolved.  EKG showed normal sinus rhythm at 83  beats per minute.  The patient is currently stable and ready for  discharge home.  She has progressed great with therapy.   DISPOSITION:  Discharge home.   CONDITION:  Good and stable.   MEDICATIONS:  1. Dilaudid 2 mg 1 tablet p.o. q.6-8. h. p.r.n. for pain.  2. Robaxin 500 mg 1 tablet p.o. q.6 h. p.r.n. for spasms.  3. Coumadin pharmacy protocol.  4. Lovenox 30 mg one subcu injection b.i.d. x3 days and stop when      Coumadin is therapeutic.  5. FeSO4 325 mg 1 tablet p.o. b.i.d. x10 days.  6. Resume previous home meds.   INSTRUCTIONS:  The patient will work with Home Health PT and OT to  improve ambulation and knee range of motion and strengthening.  Daily  dressing changes with 4x4 gauze and tape.  Weightbearing as tolerated.  She will follow up when she is 2 weeks postop for recheck.  Return  sooner if needed.       Genene Churn. Denton Meek.      Loreta Ave, M.D.  Electronically Signed    JMO/MEDQ  D:  03/17/2008  T:  03/18/2008  Job:  [id number]

## 2010-06-08 NOTE — Discharge Summary (Signed)
Selena [name], Selena [name]                ACCOUNT NO.:  [id number]   MEDICAL RECORD NO.:  [id number]          PATIENT TYPE:  OIB   LOCATION:  6523                         FACILITY:  [facility]   PHYSICIAN:  Maple Mirza, PA   DATE OF BIRTH:  14-Mar-1935   DATE OF ADMISSION:  02/08/2007  DATE OF DISCHARGE:  02/09/2007                               DISCHARGE SUMMARY   REPORT TIME:  Dictation and exam greater than 35 minutes.   ALLERGIES:  This patient has allergy to codeine.   FINAL DIAGNOSES:  1. Discharging day one, status post electrophysiology study,      radiofrequency catheter ablation of typical atrial flutter with      creation of caval tricuspid isthmus block.  2. Symptoms with atrial flutter include palpitation and fatigue.   SECONDARY DIAGNOSIS:  Osteoarthritis.   PROCEDURE:  02/08/07 - Electrophysiology study, radiofrequency catheter  ablation of typical atrial flutter, Dr. Sherryl Manges.   COMPLICATIONS:  No post procedural complications.   BRIEF HISTORY:  Selena [name] is a 75 year old female.  She was admitted  in December with atrial flutter.  She had tachy palpitations and  fatigue.  She underwent TEE-guided cardioversion and has been  therapeutic with her Coumadin.  On presentation of the office January 29, 2007, she had maintained sinus rhythm and her Coumadin was stopped.  She  was scheduled for elective procedure on February 08, 2007.   HOSPITAL COURSE:  The patient presented electively February 08, 2007.  She underwent the procedure as described above by Dr. Graciela Husbands and has done  well.  She is in sinus rhythm at the time of discharge.  She does not  need to take any Coumadin.  She takes tramadol as needed.  She sees Dr.  Graciela Husbands at [facility], [address] Monday, March 12, 2007 at 10 o'clock.  Preoperative labs were drawn on January 6,2009.  White cells 10.5, hemoglobin 16, hematocrit 47, platelets are 224,000.  Pro-time is 17.3, INR 1.9.  Coumadin  was stopped.  Sodium 142, potassium  4.5, chloride 104, carbonate 30, glucose 102, BUN is 18 and creatinine  is 0.7.      Maple Mirza, PA     GM/MEDQ  D:  02/09/2007  T:  02/09/2007  Job:  [id number]   cc:   Con Memos, MD, [facility]

## 2010-06-08 NOTE — Letter (Signed)
December 26, 2007    Loreta Ave, MD  [address]  [place], [place] [address]   RE:  Selena, [name]  MRN:  [id number]  /  DOB:  1935-05-26   Dear Selena [name],   I hope this letter finds you and your family well and Selena [name] to  all.   Selena [name] comes in for preoperative clearance for upcoming knee  replacement surgery; she has had the contralateral side done previously.   Her cardiac history is notable for atrial flutter and she is status post  ablation without recurrences.  She is status post ablation in January  2009 without known recurrences.  A prior to that she also underwent  Myoview scanning, which demonstrated normal left ventricular function  and no evidence of ischemia.   She has had no problems with chest pain, shortness of breath,  palpitations, presyncope, or syncope since that time.   Her related medical history is notable for hypertension and I am not  sure to what degree that is aggravated by her pain syndrome at this  time.   ALLERGIES:  She is allergic to CODEINE.   CURRENT MEDICATIONS:  Only aspirin 81 and diclofenac.   MEDICAL REVIEW OF SYSTEMS:  In addition to above, is notable for:  1. Diverticulosis.  2. History of elevated LFTs.  3. Remote multinodular goiter.  4. History of gastritis.  5. Right upper lung nodule felt to be benign.   PAST SURGICAL HISTORY:  In addition to above, is notable for  hysterectomy and rotator cuff surgery.   SOCIAL HISTORY:  She is widowed and retired.  She has 2 children and 4  grandchildren.  Her golf clubs have been hanging up in closet.   FAMILY HISTORY:  Noncontributory.   PHYSICAL EXAMINATION:  GENERAL:  She is an elderly Caucasian female  appearing her stated age of 78.  VITAL SIGNS:  Her blood pressure is 146/90, her pulse is 80, and her  weight is 196, which is down about 10 pounds in the last 8 months.  HEENT:  No icterus or xanthoma.  NECK:  Veins are flat.  The  carotids are brisk and full bilaterally.  No  bruits are appreciated.  BACK:  Without kyphosis or scoliosis.  LUNGS:  Clear.  HEART:  Sounds has slight irregularity as suspected with the PVC, but is  otherwise normal.  No S4 is appreciated.  ABDOMEN:  Soft with active bowel sounds without midline pulsation or  hepatomegaly.  EXTREMITIES:  Femoral pulses are 2+.  Distal pulses are intact.  There  is no clubbing, cyanosis, or edema.  NEUROLOGICAL:  Grossly normal.  SKIN:  Warm and dry.   Electrocardiogram dated today demonstrated sinus rhythm at 80 with  intervals of 0.17/0.08/0.39.  The axis was 50 degrees.   IMPRESSION:  1. Upcoming knee surgery.  2. History of atrial flutter ablation.  3. Hypertension.   Selena [name], Selena [name] should be at acceptable risk for surgery with her good  functional status, her negative prior Myoview, and normal left  ventricular function.   We will be available as necessary in the event that anything comes up at  the time of her surgery.   We will ask her to follow up with Dr. Amador Cunas about her blood  pressure.  She did see him today.   We will see her again at Dr. Vernon Prey request.    Sincerely,      Selena Salvia, MD, [facility]  Electronically Signed    SCK/MedQ  DD: 12/26/2007  DT: 12/27/2007  Job #: [id number]   CC:    Gordy Savers, MD

## 2010-06-11 NOTE — Discharge Summary (Signed)
NAME:  Selena [name], Selena [name]                          ACCOUNT NO.:  [id number]   MEDICAL RECORD NO.:  [id number]                   PATIENT TYPE:  INP   LOCATION:  5037                                 FACILITY:  [facility]   PHYSICIAN:  Loreta Ave, M.D.              DATE OF BIRTH:  11/07/1935   DATE OF ADMISSION:  07/24/2002  DATE OF DISCHARGE:  07/28/2002                                 DISCHARGE SUMMARY   ADMISSION DIAGNOSIS:  Advanced degenerative joint disease of the left knee.   DISCHARGE DIAGNOSIS:  Advanced degenerative joint disease of the left knee.   PROCEDURE:  Left total knee replacement.   HISTORY:  A 75 year old white female with advanced medial compartment DJD of  the left knee.  She was tried with conservative treatment including  injections but has failed.  She has pain with activities of daily living.  Indicated for left medial unicompartmental knee versus total knee  replacement.   HOSPITAL COURSE:  A 75 year old female admitted July 24, 2002, after  appropriate laboratory studies were obtained as well as 1 g Ancef IV on call  to the operating room.  Was taken to the operating room where she underwent  left total knee replacement.  She tolerated the procedure well.  Epidural  was placed for postop pain management.  Continued on Ancef 1 g IV q.8h. x 3  doses.  Started on heparin 5000 units subcutaneously q.12h. until Coumadin  became therapeutic.  Consultations with PT, OT, and rehabilitation were  made.  Ambulation weightbearing as tolerated on the leg.  CPM 0 to 30  degrees for 8 hours per day, increasing by 10 degrees a day.  Foley was  placed intraoperatively.   She was allowed out of bed to a chair the following day.  Her epidural,  Foley, dressing, and Hemovac's were discontinued on July 2.  Robaxin 500 mg  p.o. q.6-8h. p.r.n. spasms were used.  Cepacol lozenges at bedside for sore  throat.  Percocet was discontinued.  She was placed on Mepergan Fortis 1 to  2  tablets every 4 hours as needed for pain.  She was discharged home on July  4 to return back to the office in 10 to 14 days for followup.   LABORATORY DATA:  EKG revealed normal sinus rhythm.   Radiographic study revealed satisfactory postoperative appearance of a left  total knee replacement.   Hemoglobin 15.4, hematocrit 44.5%, white count 9400, platelet count 209,000.  Discharge hemoglobin 10.6, hematocrit 31.3%, white count 12,900, platelets  203,000.  Pro time at discharge was 17.0 with an INR of 1.4.  Discharge  chemistries: Sodium 141, potassium 4.6, chloride 106, CO2 28, glucose 98,  BUN 20, creatinine 0.8, calcium 9.7, total protein 6.8, albumin 3.9, AST 36,  ALT 47, alkaline phosphatase 89, total bilirubin 0.7.  Discharge sodium 134,  potassium 3.2, chloride 98, CO2 28, glucose 103, BUN 7, creatinine  0.8,  calcium 8.6.  Urinalysis was benign for voided urine.  Blood type was O  positive, antibody screen negative.   DISCHARGE MEDICATIONS:  1. Prescription for Mepergan Fortis 1 to 2 tablets every 4 to 6 hours as     needed for pain.  2. Coumadin 5 mg daily and directed by [place].  3. Colace 100 mg b.i.d.  4. Senokot S 2 tablets with dinner.   ACTIVITY:  As tolerated per physical therapy.   DIET:  No restrictions.   WOUND CARE:  Keep wound clean and dry.  Call for increased temperature  greater than 101.5 with increased pain or redness or drainage.   FOLLOW UP:  Call for appointment on July 13 or 14, 2004, with Dr. Eulah Pont or  Arlys John.   CONDITION ON DISCHARGE:  She was discharged in improved condition.     Oris Drone Petrarca, P.A.-C.                Loreta Ave, M.D.    BDP/MEDQ  D:  08/12/2002  T:  08/13/2002  Job:  [id number]

## 2010-06-11 NOTE — Assessment & Plan Note (Signed)
[facility]                            [facility] OFFICE NOTE   NAME:Selena [name], Selena [name]                       MRN:          [id number]  DATE:12/14/2005                            DOB:          01-Feb-1935    HISTORY:  The patient is a 75 year old female seen today for a  competence exam.  She continues to have dyspnea on exertion.  This has  been present for some months.  She denies any chest pain or progression  of her symptoms.  She did have a CT angio of her chest last spring.  This will require follow-up CT in the spring to assess a small right  upper lobe nodule.  She did have a negative ETT in 2001.   PAST MEDICAL/SURGICAL HISTORY:  She has had a prior hysterectomy,  rotator cuff surgery, as well as arthroscopic knee surgery.  She has  degenerative joint disease, diverticulosis and also a history of  elevated liver function tests.  An abdominal ultrasound earlier in the  year revealed a hepatic cyst, but no other abnormalities.   FAMILY HISTORY:  Positive for colon cancer and polyps.  Father died  young, a drowning death.  One sister died of renal cancer.   PHYSICAL EXAMINATION:  GENERAL:  An overweight white female, in no acute  distress.  VITAL SIGNS:  Blood pressure 140/90.  HEENT:  Fundi, ears, nose and throat clear.  NECK:  No bruits.  CHEST:  Clear.  CARDIOVASCULAR:  Normal heart sounds, no murmurs.  ABDOMEN:  Benign.  BREASTS:  Exam negative.  EXTREMITIES:  Reveal intact peripheral pulses.  No edema.  PELVIC:  Revealed an absent uterus.  No adnexal masses.  RECTAL:  Deferred, in view of recent colonoscopy.   IMPRESSION:  Dyspnea on exertion.   DISPOSITION:  Will set up for a Cardiolite stress test.  Medical regimen  unchanged.  Weight loss encouraged.  Will reassess in six months.  Will  set up for a follow-up chest CT at that time.     Gordy Savers, MD  Electronically Signed    PFK/MedQ  DD: 12/14/2005  DT: 12/14/2005   Job #: [id number]

## 2010-06-11 NOTE — Op Note (Signed)
NAME:  Selena [name], Selena [name]                          ACCOUNT NO.:  [id number]   MEDICAL RECORD NO.:  [id number]                   PATIENT TYPE:  INP   LOCATION:  NA                                   FACILITY:  [facility]   PHYSICIAN:  Loreta Ave, M.D.              DATE OF BIRTH:  1935-12-16   DATE OF PROCEDURE:  07/24/2002  DATE OF DISCHARGE:                                 OPERATIVE REPORT   PREOPERATIVE DIAGNOSIS:  End-staged degenerative arthritis, left knee.   POSTOPERATIVE DIAGNOSIS:  End-stage degenerative arthritis, left knee.   OPERATION/PROCEDURE:  Left total knee replacement, Osteonics prosthesis.  Cemented #7 posterior stabilized femoral component.  Cemented #7 tibial  component with 12 mm posterior stabilized flex insert.  Cemented recess  __________ 26 mm patellar component.  Soft tissue balancing including  lateral retinacular release.   ASSISTANT:  Oris Drone. Petrarca, P.A.-C.   ANESTHESIA:  General.   ESTIMATED BLOOD LOSS:  Minimal.   TOURNIQUET TIME:  One hour and 15 minutes.   SPECIMENS:  Bone and soft tissue.   CULTURES:  None.   COMPLICATIONS:  None.   DRAINS:  Drain Hemovac x2.   DRESSINGS:  Soft compressive with knee immobilizer.   DESCRIPTION OF PROCEDURE:  The patient was brought to the orp and placed on  the operating room table in the supine position.  After adequate anesthesia  had been obtained, the left knee was examined.  Full extension, flexion to  120 degrees.  Varus alignment correctable to neutral.  Moderate  patellofemoral crepitus with lateral tracking and tethering of the  patellofemoral joint.  The tourniquet and leg holder applied, the leg  prepped and draped in the usual sterile fashion.  Exsanguinated with  elevation and Esmarch.  The tourniquet inflated to 350 mmHg.  A straight  incision above the patella down to the tibial tubercle.  The medial  parapatellar arthrotomy, hemostasis with cautery.  The medial capsular  release.  Knee  exposed.  Grade IV changes medial compartment with grade II  and III changes in the patellofemoral joint and lateral compartment.  Remnants of menisci and cruciate ligaments.  Articular spurs, loose bodies  all removed.  Distal femur exposed.  Intermedullary guide placed.  The  distal cut removing 10 mm set at 5 degrees of valgus.  Sized to a #7  component.  A jig was put in place.  The cuts made.  The trial put in place  and found to fit well.  Trial removed.  Attention turned to the tibia.  The  tibial spine removed with a saw.  The proximal cut with the 5 degree  posterior saw, cut removing 6 mm.  Sized to a #7 component.  The patella  sized, reamed and drilled for 26 mm component.  Trial was put in place  throughout.  With the 12 mm flex insert, full extension, full flexion nicely  balanced  knee with good stability.  Persistent lateral patellofemoral  tracking, markedly improved with lateral release.  The tibia was marked for  appropriate rotation and then hand reamed.  All trials were removed.  The  knee irrigated with a pulsed irrigating device.  Cement repair placed on all  components which was then firmly seated with removing of excessive cement.  Polyethelene attached to the tibia.  The knee reduced.  Re-examined.  Although she still has persistent lateral tracking, her tethering is  markedly improved with lateral release so that it is acceptable tracking at  completion.  Good stability, full extension, full flexion without lift off  in flexion.  Once the cement had hardened, the knee was re-irrigated.  Hemovacs were placed through separate stab wounds.  Arthrotomy closed with  #1 Vicryl, the skin and subcutaneous tissue with Vicryl and staples.  The  margins of the wound of the knee were injected, Marcaine and ___________.  A  sterile compressive dressing applied.  The tourniquet deflated and removed.  Knee immobilizer applied.  Anesthesia reversed.  Brought to the recovery  room.   Tolerated the surgery well, no complications.                                                Loreta Ave, M.D.    DFM/MEDQ  D:  07/24/2002  T:  07/24/2002  Job:  [id number]

## 2010-06-16 ENCOUNTER — Ambulatory Visit (AMBULATORY_SURGERY_CENTER): Payer: Medicare Other | Admitting: Gastroenterology

## 2010-06-16 ENCOUNTER — Encounter: Payer: Self-pay | Admitting: Gastroenterology

## 2010-06-16 VITALS — BP 170/79 | HR 72 | Temp 97.2°F | Resp 18 | Ht 68.0 in | Wt 205.0 lb

## 2010-06-16 DIAGNOSIS — Z1211 Encounter for screening for malignant neoplasm of colon: Secondary | ICD-10-CM

## 2010-06-16 DIAGNOSIS — Z800 Family history of malignant neoplasm of digestive organs: Secondary | ICD-10-CM

## 2010-06-16 DIAGNOSIS — K529 Noninfective gastroenteritis and colitis, unspecified: Secondary | ICD-10-CM

## 2010-06-16 DIAGNOSIS — K5730 Diverticulosis of large intestine without perforation or abscess without bleeding: Secondary | ICD-10-CM

## 2010-06-16 DIAGNOSIS — K5289 Other specified noninfective gastroenteritis and colitis: Secondary | ICD-10-CM

## 2010-06-16 MED ORDER — SODIUM CHLORIDE 0.9 % IV SOLN: 500.0000 mL | INTRAVENOUS | Status: DC

## 2010-06-16 NOTE — Patient Instructions (Signed)
Discharged instructions given with verbal understanding. Handout on diverticulosis given. Resume previous medications. 

## 2010-06-17 ENCOUNTER — Telehealth: Payer: Self-pay | Admitting: *Deleted

## 2010-06-17 NOTE — Telephone Encounter (Signed)
Follow up Call- Patient questions:  Do you have a fever, pain , or abdominal swelling? no Pain Score  0 *  Have you tolerated food without any problems? yes  Have you been able to return to your normal activities? yes  Do you have any questions about your discharge instructions: Diet   yes Medications  no Follow up visit  no  Do you have questions or concerns about your Care? no  Actions: * If pain score is 4 or above: No action needed, pain <4.  Pt had questions about diverticulosis and high fiber diet.

## 2010-09-21 ENCOUNTER — Other Ambulatory Visit: Payer: Self-pay | Admitting: Internal Medicine

## 2010-09-21 MED ORDER — PROMETHAZINE-DM 6.25-15 MG/5ML PO SYRP: 5.0000 mL | ORAL_SOLUTION | Freq: Four times a day (QID) | ORAL | Status: DC | PRN

## 2010-09-21 NOTE — Telephone Encounter (Signed)
Ok 6 oz 

## 2010-09-21 NOTE — Telephone Encounter (Signed)
done

## 2010-09-21 NOTE — Telephone Encounter (Signed)
Pt requesting refill on PROMETHAZINE-DM  [facility]

## 2010-09-21 NOTE — Telephone Encounter (Signed)
Please advise Last seen 05/24/10

## 2010-10-28 LAB — COMPREHENSIVE METABOLIC PANEL
AST: 25 U/L (ref 0–37)
Albumin: 3.9 g/dL (ref 3.5–5.2)
Calcium: 9.8 mg/dL (ref 8.4–10.5)
Chloride: 101 mEq/L (ref 96–112)
Creatinine, Ser: 0.64 mg/dL (ref 0.4–1.2)
GFR calc Af Amer: >60 mL/min (ref >60)

## 2010-10-28 LAB — TYPE AND SCREEN: Antibody Screen: NEGATIVE

## 2010-10-28 LAB — URINALYSIS, ROUTINE W REFLEX MICROSCOPIC
Nitrite: NEGATIVE
Specific Gravity, Urine: 1.013 (ref 1.005–1.030)
pH: 8 (ref 5.0–8.0)

## 2010-10-28 LAB — CBC
MCHC: 33.6 g/dL (ref 30.0–36.0)
MCV: 95.4 fL (ref 78.0–100.0)
Platelets: 213 10*3/uL (ref 150–400)
WBC: 8.9 10*3/uL (ref 4.0–10.5)

## 2010-10-28 LAB — APTT: aPTT: 29 seconds (ref 24–37)

## 2010-11-01 LAB — PROTIME-INR
INR: 1
INR: 1.2
Prothrombin Time: 12.8
Prothrombin Time: 13.1
Prothrombin Time: 15.1

## 2010-11-01 LAB — COMPREHENSIVE METABOLIC PANEL
ALT: 30
AST: 38 — ABNORMAL HIGH
Calcium: 9.7
GFR calc Af Amer: >60
Glucose, Bld: 88
Sodium: 139
Total Protein: 7.3

## 2010-11-01 LAB — CBC
MCHC: 33.9
MCHC: 34
MCHC: 34.1
Platelets: 196
Platelets: 208
RDW: 12.6
RDW: 12.7
RDW: 13.2

## 2010-11-01 LAB — HEPARIN LEVEL (UNFRACTIONATED)
Heparin Unfractionated: 0.66
Heparin Unfractionated: 0.88 — ABNORMAL HIGH

## 2010-11-01 LAB — URINALYSIS, ROUTINE W REFLEX MICROSCOPIC
Glucose, UA: NEGATIVE
Nitrite: NEGATIVE
Protein, ur: NEGATIVE
Urobilinogen, UA: 0.2

## 2010-11-01 LAB — DIFFERENTIAL
Eosinophils Absolute: 0.2
Lymphocytes Relative: 24
Lymphs Abs: 2.4
Monocytes Relative: 6
Neutrophils Relative %: 67

## 2010-11-01 LAB — APTT: aPTT: 27

## 2011-01-25 HISTORY — PX: BREAST BIOPSY: SHX20

## 2011-01-25 HISTORY — PX: MASTECTOMY: SHX3

## 2011-01-25 HISTORY — PX: AUGMENTATION MAMMAPLASTY: SUR837

## 2011-02-22 ENCOUNTER — Encounter: Payer: Self-pay | Admitting: Internal Medicine

## 2011-02-22 ENCOUNTER — Ambulatory Visit (INDEPENDENT_AMBULATORY_CARE_PROVIDER_SITE_OTHER): Payer: Medicare Other | Admitting: Internal Medicine

## 2011-02-22 VITALS — BP 130/84 | HR 63 | Temp 97.6°F | Ht 67.5 in | Wt 210.0 lb

## 2011-02-22 DIAGNOSIS — Z0000 Encounter for general adult medical examination without abnormal findings: Secondary | ICD-10-CM

## 2011-02-22 DIAGNOSIS — I4891 Unspecified atrial fibrillation: Secondary | ICD-10-CM

## 2011-02-22 DIAGNOSIS — M1990 Unspecified osteoarthritis, unspecified site: Secondary | ICD-10-CM

## 2011-02-22 DIAGNOSIS — G589 Mononeuropathy, unspecified: Secondary | ICD-10-CM

## 2011-02-22 DIAGNOSIS — R42 Dizziness and giddiness: Secondary | ICD-10-CM

## 2011-02-22 DIAGNOSIS — Z79899 Other long term (current) drug therapy: Secondary | ICD-10-CM

## 2011-02-22 DIAGNOSIS — G629 Polyneuropathy, unspecified: Secondary | ICD-10-CM

## 2011-02-22 LAB — LIPID PANEL
LDL Cholesterol: 101 mg/dL — ABNORMAL HIGH (ref 0–99)
Total CHOL/HDL Ratio: 4

## 2011-02-22 LAB — COMPREHENSIVE METABOLIC PANEL
ALT: 46 U/L — ABNORMAL HIGH (ref 0–35)
AST: 38 U/L — ABNORMAL HIGH (ref 0–37)
CO2: 28 mEq/L (ref 19–32)
Calcium: 9.2 mg/dL (ref 8.4–10.5)
Chloride: 106 mEq/L (ref 96–112)
Creatinine, Ser: 0.6 mg/dL (ref 0.4–1.2)
GFR: 115.04 mL/min (ref >60.00)
Sodium: 142 mEq/L (ref 135–145)
Total Bilirubin: 0.7 mg/dL (ref 0.3–1.2)
Total Protein: 7 g/dL (ref 6.0–8.3)

## 2011-02-22 LAB — CBC WITH DIFFERENTIAL/PLATELET
Basophils Absolute: 0.1 10*3/uL (ref 0.0–0.1)
Eosinophils Absolute: 0.3 10*3/uL (ref 0.0–0.7)
HCT: 43.8 % (ref 36.0–46.0)
Hemoglobin: 14.9 g/dL (ref 12.0–15.0)
Lymphs Abs: 2 10*3/uL (ref 0.7–4.0)
MCHC: 34.1 g/dL (ref 30.0–36.0)
Monocytes Relative: 8.4 % (ref 3.0–12.0)
Neutro Abs: 4.3 10*3/uL (ref 1.4–7.7)
Platelets: 173 10*3/uL (ref 150.0–400.0)
RDW: 13.3 % (ref 11.5–14.6)

## 2011-02-22 MED ORDER — DICLOFENAC SODIUM 75 MG PO TBEC: 75.0000 mg | DELAYED_RELEASE_TABLET | Freq: Two times a day (BID) | ORAL | Status: DC

## 2011-02-22 MED ORDER — PROMETHAZINE HCL 50 MG PO TABS: 50.0000 mg | ORAL_TABLET | Freq: Four times a day (QID) | ORAL | Status: DC | PRN

## 2011-02-22 NOTE — Patient Instructions (Signed)
Limit your sodium (Salt) intake    It is important that you exercise regularly, at least 20 minutes 3 to 4 times per week.  If you develop chest pain or shortness of breath seek  medical attention.  You need to lose weight.  Consider a lower calorie diet and regular exercise.  Take a calcium supplement, plus [phone] units of vitamin D  Return in one year for follow-up  Schedule your mammogram.

## 2011-02-22 NOTE — Progress Notes (Signed)
Subjective:    Patient ID: Selena [name], female    DOB: Mar 03, 1935, 76 y.o.   MRN: [id number]  HPI History of Present Illness:   25  -year-old patient who is seen today for a wellness exam; she has had a  full neurological evaluation due to vertigo.  She is followed by ENT and receiving vestibular rehabilitation is still symptomatic. She has not played golf over one year.  She has a history of atrial fibrillation, status post ablation. She has DJD history of mild cognitive impairment. Today she is doing quite well. Colonoscopy last year  Here for Medicare AWV:   1. Risk factors based on Past M, S, F history: Patient has atrial fibrillation but no significant cardiovascular risk factors 2. Physical Activities: no activity restrictions; has been fairly inactive due to vertigo, but this now has stabilized  3. Depression/mood: history depression, or mood disorder  4. Hearing: wears a hearing bilaterally  5. ADL's: independent in all aspects of daily living  6. Fall Risk: Moderate due to vertigo 7. Home Safety: no problems identified  8. Height, weight, &visual acuity:height and weight stable. No difficulty with visual acuity  9. Counseling: weight loss, heart healthy diet and exercise all encouraged  10. Labs ordered based on risk factors: laboratory studies will be reviewed  11. Referral Coordination-  mammogram scheduled. Followup ENT 12. Care Plan- exercise weight loss. All encouraged  13. Cognitive Assessment- patient has a remote history of cognitive impairment ;  she appears alert oriented and appropriate and there is been no progression  Allergies:  1) ! Codeine Phosphate (Codeine Phosphate)   Past History:  Past Medical History:   Diverticulosis, colon  Osteoarthritis  Elevated LFT's  multi-nodular goiter  history of gastritis, 1993  history benign 3-mm nodule right upper lobe (. Chest CT 03-24-2006)  paroxysmal atrial fibrillation and ablation December 2008  mild cognitive  impairment  benign vertigo   Past Surgical History:  Reviewed history from 12/26/2008 and no changes required.   Hysterectomy  Rotator cuff repair  Arthroscopic knee  colonoscopy March 2007  status post DC cardioversion December 2008  status post radiofrequency ablation in January 2009  stress Myoview January 2009   Family History:  Reviewed history from 12/26/2007 and no changes required.  father died of drowning death at age 47  mother died age 76, colon cancer  3 brothers positive for colonic polyps ; one brother deceased from lung cancer 3 sisters, one died of renal cancer  Social History:  Reviewed history from 12/27/2006 and no changes required.  widowed, retired, remains active with golf     Review of Systems  Constitutional: Negative for fever, appetite change, fatigue and unexpected weight change.  HENT: Negative for hearing loss, ear pain, nosebleeds, congestion, sore throat, mouth sores, trouble swallowing, neck stiffness, dental problem, voice change, sinus pressure and tinnitus.   Eyes: Negative for photophobia, pain, redness and visual disturbance.  Respiratory: Negative for cough, chest tightness and shortness of breath.   Cardiovascular: Negative for chest pain, palpitations and leg swelling.  Gastrointestinal: Negative for nausea, vomiting, abdominal pain, diarrhea, constipation, blood in stool, abdominal distention and rectal pain.  Genitourinary: Negative for dysuria, urgency, frequency, hematuria, flank pain, vaginal bleeding, vaginal discharge, difficulty urinating, genital sores, vaginal pain, menstrual problem and pelvic pain.  Musculoskeletal: Negative for back pain and arthralgias.  Skin: Negative for rash.  Neurological: Negative for dizziness, syncope, speech difficulty, weakness, light-headedness, numbness and headaches.  Hematological: Negative for adenopathy. Does not  bruise/bleed easily.  Psychiatric/Behavioral: Negative for suicidal ideas,  behavioral problems, self-injury, dysphoric mood and agitation. The patient is not nervous/anxious.        Objective:   Physical Exam  Constitutional: She is oriented to person, place, and time. She appears well-developed and well-nourished.  HENT:  Head: Normocephalic and atraumatic.  Right Ear: External ear normal.  Left Ear: External ear normal.  Mouth/Throat: Oropharynx is clear and moist.  Eyes: Conjunctivae and EOM are normal.  Neck: Normal range of motion. Neck supple. No JVD present. No thyromegaly present.  Cardiovascular: Normal rate, regular rhythm, normal heart sounds and intact distal pulses.   No murmur heard. Pulmonary/Chest: Effort normal and breath sounds normal. She has no wheezes. She has no rales.  Abdominal: Soft. Bowel sounds are normal. She exhibits no distension and no mass. There is no tenderness. There is no rebound and no guarding.       Lower midline scar  Genitourinary: Vagina normal.  Musculoskeletal: Normal range of motion. She exhibits no edema and no tenderness.       Surgical scars both knees  Neurological: She is alert and oriented to person, place, and time. She has normal reflexes. No cranial nerve deficit. She exhibits normal muscle tone. Coordination normal.       Decreased vibratory sensation distally  Skin: Skin is warm and dry. No rash noted.  Psychiatric: She has a normal mood and affect. Her behavior is normal.          Assessment & Plan:   Preventive health examination History of atrial fibrillation status post ablation Vertigo Osteoarthritis  Laboratory update will be reviewed. Mammogram scheduled for next week Recheck one year Exercise weight loss all encouraged

## 2011-02-25 ENCOUNTER — Other Ambulatory Visit: Payer: Self-pay | Admitting: Internal Medicine

## 2011-02-25 DIAGNOSIS — R921 Mammographic calcification found on diagnostic imaging of breast: Secondary | ICD-10-CM

## 2011-02-25 LAB — METHYLMALONIC ACID, SERUM: Methylmalonic Acid, Quant: 0.16 umol/L (ref <0.40)

## 2011-03-08 ENCOUNTER — Other Ambulatory Visit: Payer: Self-pay | Admitting: Internal Medicine

## 2011-03-08 ENCOUNTER — Ambulatory Visit
Admission: RE | Admit: 2011-03-08 | Discharge: 2011-03-08 | Disposition: A | Discharge status: Home / Self Care | Payer: PRIVATE HEALTH INSURANCE | Source: Ambulatory Visit | Attending: Internal Medicine | Admitting: Internal Medicine

## 2011-03-08 DIAGNOSIS — R921 Mammographic calcification found on diagnostic imaging of breast: Secondary | ICD-10-CM

## 2011-03-17 ENCOUNTER — Ambulatory Visit
Admission: RE | Admit: 2011-03-17 | Discharge: 2011-03-17 | Disposition: A | Discharge status: Home / Self Care | Payer: PRIVATE HEALTH INSURANCE | Source: Ambulatory Visit | Attending: Internal Medicine | Admitting: Internal Medicine

## 2011-03-17 ENCOUNTER — Other Ambulatory Visit: Payer: Self-pay | Admitting: Internal Medicine

## 2011-03-17 DIAGNOSIS — R921 Mammographic calcification found on diagnostic imaging of breast: Secondary | ICD-10-CM

## 2011-03-18 ENCOUNTER — Ambulatory Visit
Admission: RE | Admit: 2011-03-18 | Discharge: 2011-03-18 | Disposition: A | Discharge status: Home / Self Care | Payer: PRIVATE HEALTH INSURANCE | Source: Ambulatory Visit | Attending: Internal Medicine | Admitting: Internal Medicine

## 2011-03-18 ENCOUNTER — Other Ambulatory Visit: Payer: Self-pay | Admitting: Internal Medicine

## 2011-03-18 ENCOUNTER — Telehealth: Payer: Self-pay | Admitting: Internal Medicine

## 2011-03-18 DIAGNOSIS — C50911 Malignant neoplasm of unspecified site of right female breast: Secondary | ICD-10-CM

## 2011-03-18 DIAGNOSIS — R921 Mammographic calcification found on diagnostic imaging of breast: Secondary | ICD-10-CM

## 2011-03-18 NOTE — Telephone Encounter (Signed)
pls disregard. Opened in error.

## 2011-03-21 ENCOUNTER — Other Ambulatory Visit: Payer: Self-pay | Admitting: *Deleted

## 2011-03-21 ENCOUNTER — Telehealth: Payer: Self-pay | Admitting: *Deleted

## 2011-03-21 DIAGNOSIS — C50411 Malignant neoplasm of upper-outer quadrant of right female breast: Secondary | ICD-10-CM

## 2011-03-21 DIAGNOSIS — D0510 Intraductal carcinoma in situ of unspecified breast: Secondary | ICD-10-CM

## 2011-03-21 HISTORY — DX: Malignant neoplasm of upper-outer quadrant of right female breast: C50.411

## 2011-03-21 NOTE — Telephone Encounter (Signed)
Confirmed [facility] for 03/23/11 at 1200 .  Instructions and contact information given.  

## 2011-03-22 ENCOUNTER — Ambulatory Visit
Admission: RE | Admit: 2011-03-22 | Discharge: 2011-03-22 | Disposition: A | Discharge status: Home / Self Care | Payer: Medicare Other | Source: Ambulatory Visit | Attending: Internal Medicine | Admitting: Internal Medicine

## 2011-03-22 DIAGNOSIS — C50911 Malignant neoplasm of unspecified site of right female breast: Secondary | ICD-10-CM

## 2011-03-22 MED ORDER — GADOBENATE DIMEGLUMINE 529 MG/ML IV SOLN
19.0000 mL | Freq: Once | INTRAVENOUS | Status: AC | PRN
  Administered 2011-03-22: 19 mL via INTRAVENOUS

## 2011-03-23 ENCOUNTER — Ambulatory Visit: Payer: Medicare Other

## 2011-03-23 ENCOUNTER — Ambulatory Visit
Admission: RE | Admit: 2011-03-23 | Discharge: 2011-03-23 | Disposition: A | Discharge status: Home / Self Care | Payer: Medicare Other | Source: Ambulatory Visit | Attending: Radiation Oncology | Admitting: Radiation Oncology

## 2011-03-23 ENCOUNTER — Encounter: Payer: Self-pay | Admitting: Radiation Oncology

## 2011-03-23 ENCOUNTER — Other Ambulatory Visit (HOSPITAL_BASED_OUTPATIENT_CLINIC_OR_DEPARTMENT_OTHER): Payer: Medicare Other | Admitting: Lab

## 2011-03-23 ENCOUNTER — Ambulatory Visit (HOSPITAL_BASED_OUTPATIENT_CLINIC_OR_DEPARTMENT_OTHER): Payer: Medicare Other | Admitting: General Surgery

## 2011-03-23 ENCOUNTER — Ambulatory Visit: Payer: Medicare Other | Attending: General Surgery | Admitting: Physical Therapy

## 2011-03-23 ENCOUNTER — Encounter: Payer: Self-pay | Admitting: *Deleted

## 2011-03-23 ENCOUNTER — Encounter (INDEPENDENT_AMBULATORY_CARE_PROVIDER_SITE_OTHER): Payer: Self-pay | Admitting: General Surgery

## 2011-03-23 ENCOUNTER — Encounter: Payer: Self-pay | Admitting: Oncology

## 2011-03-23 ENCOUNTER — Telehealth: Payer: Self-pay | Admitting: *Deleted

## 2011-03-23 ENCOUNTER — Ambulatory Visit (HOSPITAL_BASED_OUTPATIENT_CLINIC_OR_DEPARTMENT_OTHER): Payer: Medicare Other | Admitting: Oncology

## 2011-03-23 VITALS — BP 172/90 | HR 69 | Temp 98.0°F | Ht 67.5 in | Wt 207.3 lb

## 2011-03-23 DIAGNOSIS — D0510 Intraductal carcinoma in situ of unspecified breast: Secondary | ICD-10-CM

## 2011-03-23 DIAGNOSIS — C50119 Malignant neoplasm of central portion of unspecified female breast: Secondary | ICD-10-CM

## 2011-03-23 DIAGNOSIS — M25619 Stiffness of unspecified shoulder, not elsewhere classified: Secondary | ICD-10-CM | POA: Insufficient documentation

## 2011-03-23 DIAGNOSIS — IMO0001 Reserved for inherently not codable concepts without codable children: Secondary | ICD-10-CM | POA: Insufficient documentation

## 2011-03-23 DIAGNOSIS — M25519 Pain in unspecified shoulder: Secondary | ICD-10-CM | POA: Insufficient documentation

## 2011-03-23 DIAGNOSIS — D0590 Unspecified type of carcinoma in situ of unspecified breast: Secondary | ICD-10-CM

## 2011-03-23 LAB — CBC WITH DIFFERENTIAL/PLATELET
BASO%: 0.4 % (ref 0.0–2.0)
Basophils Absolute: 0 10*3/uL (ref 0.0–0.1)
EOS%: 3.7 % (ref 0.0–7.0)
HCT: 44.1 % (ref 34.8–46.6)
HGB: 14.7 g/dL (ref 11.6–15.9)
MONO#: 0.6 10*3/uL (ref 0.1–0.9)
NEUT%: 63.2 % (ref 38.4–76.8)
RDW: 12.7 % (ref 11.2–14.5)
WBC: 8.3 10*3/uL (ref 3.9–10.3)
lymph#: 2.1 10*3/uL (ref 0.9–3.3)

## 2011-03-23 LAB — COMPREHENSIVE METABOLIC PANEL
ALT: 41 U/L — ABNORMAL HIGH (ref 0–35)
AST: 33 U/L (ref 0–37)
Albumin: 4.1 g/dL (ref 3.5–5.2)
BUN: 16 mg/dL (ref 6–23)
CO2: 30 mEq/L (ref 19–32)
Calcium: 9.8 mg/dL (ref 8.4–10.5)
Chloride: 104 mEq/L (ref 96–112)
Creatinine, Ser: 0.61 mg/dL (ref 0.50–1.10)
Potassium: 4.1 mEq/L (ref 3.5–5.3)

## 2011-03-23 NOTE — Progress Notes (Signed)
Selena [name] [id number] [birth year] 76 y.o. 03/23/2011 5:33 PM  CC  Rogelia Boga, MD, MD [address] [place] [address] Dr. Emelia Loron Dr. Lonie Peak  REASON FOR CONSULTATION:  76 year old female with new diagnosis of ductal carcinoma in situ with associated calcifications of the right breast. Patient is seen in the multidisciplinary breast clinic for discussion of her treatment options. STAGE:   DCIS (ductal carcinoma in situ) of breast   Primary site: Breast   Staging method: AJCC 7th Edition   Clinical: (Tis (DCIS), NX, cM0)   Summary: (Tis (DCIS), NX, cM0)  REFERRING PHYSICIAN: Dr. Emelia Loron  HISTORY OF PRESENT ILLNESS:  Selena [name] is a 76 y.o. female.  With medical history significant for GERD-type go. She has also had history of heart arrhythmias and she is status post heart ablation. She also has history of right knee replacement. Patient in February presented for her routine screening mammogram that showed some suspicious microcalcifications. These were located in her right breast. They extended from the subareolar region posteriorly and measured up to 9 cm. Because of the suspicious nature of the calcifications patient had a needle core biopsy performed on 03/17/2011. The needle core biopsy from the 9:00 position 9 cm from the nipple revealed ductal carcinoma in situ with associated calcifications. And needle core biopsy of the seventh area original 1 cm deep to nipple were also ductal carcinoma in situ with associated calcifications. The tumor was ER positive PR positive. She went on to have MRI of the breasts performed on 03/22/2011 the MRI showed a total area of enhancement posteriorly within the right breast of 9.5 cm. The distance between the clips placed during the stereotactic biopsies measured 7.8 cm. In addition there was enhancing mass located centrally and superiorly within the middle third of the right breast at the 12:00  position measuring 1.2 x 1.0 x 1.0 cm concerning for invasive carcinoma. Patient's case was discussed at the multidisciplinary breast conference this morning. She is here for further discussion of treatment options. She is without any complaints.   Past Medical History: Past Medical History  Diagnosis Date  . Atrial fibrillation 01/01/2007  . BACK PAIN, UPPER 07/09/2008  . DIVERTICULOSIS, COLON 12/26/2006  . LIVER FUNCTION TESTS, ABNORMAL, HX OF 12/26/2007  . Memory loss 05/16/2007  . OSTEOARTHRITIS 12/26/2006  . VERTIGO 09/07/2009  . Cataract   . Hypertension   . Goiter     multi-nodular  . Skin cancer   . Hx of colonoscopy 2009  . Wears glasses   . Wears hearing aid   . Wears dentures   . SOB (shortness of breath) on exertion     Past Surgical History: Past Surgical History  Procedure Date  . Abdominal hysterectomy   . Rotator cuff repair     right  . Knee surgery     arthroscopic/ bilateral knee replacements  . Cataract extraction w/ intraocular lens  implant, bilateral   . Colonoscopy   . Appendectomy   . Ablation of dysrhythmic focus 2009    Family History: Family History  Problem Relation Age of Onset  . Colon cancer Mother   . Colon cancer Maternal Aunt   . Cancer Maternal Uncle   . Prostate cancer Maternal Uncle   . Prostate cancer Maternal Uncle   . Other Maternal Uncle     Social History Patient is retired she is widowed she has 2 children 3 so 80 [place] 43 she does not smoke does not drink.  History  Substance Use Topics  . Smoking status: Former Smoker    Quit date: 01/25/1968  . Smokeless tobacco: Never Used  . Alcohol Use: No    Allergies: Allergies  Allergen Reactions  . Codeine   . Codeine Phosphate     Current Medications: Current Outpatient Prescriptions  Medication Sig Dispense Refill  . calcium carbonate (TUMS EX) 750 MG chewable tablet Chew 1 tablet by mouth daily.      . chlorhexidine (PERIDEX) 0.12 % solution Take 1 oz by mouth  Daily.      . diclofenac (VOLTAREN) 75 MG EC tablet Take 1 tablet (75 mg total) by mouth 2 (two) times daily.  180 tablet  6  . promethazine (PHENERGAN) 50 MG tablet Take 1 tablet (50 mg total) by mouth every 6 (six) hours as needed for nausea.  20 tablet  4  . aspirin 81 MG tablet Take 81 mg by mouth daily.        . peg 3350 powder (MOVIPREP) 100 G SOLR MOVI Prep take as directed  1 kit  0  . promethazine-dextromethorphan (PROMETHAZINE-DM) 6.25-15 MG/5ML syrup Take 5 mLs by mouth 4 (four) times daily as needed.  120 mL  0   Current Facility-Administered Medications  Medication Dose Route Frequency Provider Last Rate Last Dose  . 0.9 %  sodium chloride infusion  500 mL Intravenous Continuous Louis Meckel, MD        OB/GYN History:Menarche at age 30 menopause 1971 she has had 2 term birth first live birth at 43.  Fertility Discussion: N/A Prior History of Cancer: N/A  Health Maintenance:  Colonoscopy up to date Bone Density yes Last PAP smear 2011  ECOG PERFORMANCE STATUS: 1 - Symptomatic but completely ambulatory  Genetic Counseling/testing: Family history is reviewed mom had colon cancer at the age of 74 and aunt had kidney cancer at 73 and uncle had lymph node cancer at 75 mom's brother had prostate cancer and another brother had prostate cancer non-Hodgkin Disease. There is no family history of ovarian cancer or breast cancers.  REVIEW OF SYSTEMS:  Constitutional: negative Ears, nose, mouth, throat, and face: negative Respiratory: positive for dyspnea on exertion Cardiovascular: negative Gastrointestinal: negative Genitourinary:negative Integument/breast: positive for breast tenderness Hematologic/lymphatic: negative Musculoskeletal:positive for arthralgias and back pain Neurological: negative  PHYSICAL EXAMINATION: Blood pressure 172/90, pulse 69, temperature 98 F (36.7 C), height 5' 7.5" (1.715 m), weight 207 lb 4.8 oz (94.031 kg).  GNF:AOZHY, healthy, no  distress, well nourished and well developed SKIN: skin color, texture, turgor are normal HEAD: No masses, lesions, tenderness or abnormalities EYES: PERRLA, EOMI, Conjunctiva are pink and non-injected, sclera clear EARS: External ears normal OROPHARYNX:no exudate, no erythema and lips, buccal mucosa, and tongue normal  NECK: supple, no adenopathy, no bruits, no JVD LYMPH:  no palpable lymphadenopathy, no hepatosplenomegaly BREAST:Right breast does show area of ecchymosis there is a palpable hematoma otherwise no other masses or nipple discharge are noted , left breast no masses or nipple LUNGS: clear to auscultation and percussion HEART: regular rate & rhythm, no murmurs and no gallops ABDOMEN:abdomen soft, non-tender, normal bowel sounds and no masses or organomegaly BACK: Back symmetric, no curvature. EXTREMITIES:no edema, no clubbing, no cyanosis  NEURO: alert & oriented x 3 with fluent speech, no focal motor/sensory deficits, gait normal, reflexes normal and symmetric   STUDIES/RESULTS: Mr Breast Bilateral W Wo Contrast  03/22/2011  *RADIOLOGY REPORT*  Clinical Data: Recently diagnosed right breast DCIS on two separate right breast stereotactic  core biopsies.  Preoperative evaluation.  BILATERAL BREAST MRI WITH AND WITHOUT CONTRAST  Technique: Multiplanar, multisequence MR images of both breasts were obtained prior to and following the intravenous administration of 19ml of Multihance.  Three dimensional images were evaluated at the independent DynaCad workstation.  Comparison:  03/17/2011, 03/08/2011, 02/04/2010, 05/26/2009, 11/24/2008, 11/17/2008, 11/07/2007 mammograms.  Findings: Non mass clumped linear enhancement is seen extending from the subareolar portion of the right breast posteriorly into the posterior third of the right breast.  This corresponds to the distribution of the numerous microcalcifications within the right breast.  There is also enhancement posteriorly within the right  breast which in part may be secondary to post biopsy change.  The anterior- posterior extent of the enhancement measures 9.5 cm. The distance between the clips placed during the stereotactic biopsies measures 7.8 cm.  In addition, there is an enhancing mass located centrally and superiorly within the middle third of the right breast at approximately the 12 o'clock position which measures 1.2 x 1.0 x 1.0 cm in size and is associated with a washout enhancement curve worrisome for an invasive carcinoma.  There are no foci of worrisome enhancement within the left breast.  There is no evidence for axillary or internal mammary adenopathy and there are no additional findings.  IMPRESSION:  1.  Diffuse clumped linear enhancement consistent with DCIS extending from the subareolar portion of the right breast posteriorly for approximately 9.5 cm.  This corresponds to the recently biopsied diffuse pleomorphic calcifications present within the right breast. 2.  1.2 cm irregular enhancing mass located within the central superior right breast at the 12 o'clock position worrisome for invasive carcinoma. 3.  No additional findings.  THREE-DIMENSIONAL MR IMAGE RENDERING ON INDEPENDENT WORKSTATION:  Three-dimensional MR images were rendered by post-processing of the original MR data on an independent workstation.  The three- dimensional MR images were interpreted, and findings were reported in the accompanying complete MRI report for this study.  BI-RADS CATEGORY 6:  Known biopsy-proven malignancy - appropriate action should be taken.  Original Report Authenticated By: Rolla Plate, M.D.   Mm Breast Stereo Biopsy Right  03/18/2011  *RADIOLOGY REPORT*  Clinical Data:  Microcalcifications in the right upper outer quadrant.  This biopsy is of calcifications in the right subareolar region, 1 cm deep to the nipple.  STEREOTACTIC-GUIDED VACUUM ASSISTED BIOPSY OF THE RIGHT BREAST AND SPECIMEN RADIOGRAPH  The patient and I discussed  the procedure of stereotactic-guided biopsy, including benefits and alternatives.  We discussed the high likelihood of a successful procedure. We discussed the risks of the procedure, including infection, bleeding, tissue injury, clip migration and inadequate sampling.  Informed written consent was given.  Using sterile technique, 2% lidocaine, stereotactic guidance and a 9 gauge vacuum assisted device, biopsy was performed of the calcifications in the subareolar region, 1 cm deep to the right nipple.  Specimen radiograph was performed, showing multiple calcifications within multiple core specimens.  Specimens with calcifications are identified for pathology.  At the conclusion of the procedure, a dumbbell shaped tissue marker clip was deployed into the biopsy cavity.  Follow-up 2-view mammogram confirmed clip placement and removal of the calcifications.  IMPRESSION: Stereotactic-guided biopsy of microcalcifications in the right subareolar region, 1 cm deep to the nipple.  No apparent complications.  Original Report Authenticated By: Daryl Eastern, M.D.   Mm Breast Stereo Biopsy Right  03/18/2011  *RADIOLOGY REPORT*  Clinical Data:  Microcalcifications in the right upper outer quadrant.  This biopsy is of  calcifications at approximately 9 o'clock, 9 cm from the right nipple.  STEREOTACTIC-GUIDED VACUUM ASSISTED BIOPSY OF THE RIGHT BREAST AND SPECIMEN RADIOGRAPH  The patient and I discussed the procedure of stereotactic-guided biopsy, including benefits and alternatives.  We discussed the high likelihood of a successful procedure. We discussed the risks of the procedure, including infection, bleeding, tissue injury, clip migration and inadequate sampling.  Informed written consent was given.  Using sterile technique, 2% lidocaine, stereotactic guidance and a 9 gauge vacuum assisted device, biopsy was performed of the calcifications at approximately 9 o'clock, 9 cm from the right nipple.  Specimen radiograph  was performed, showing multiple calcifications within multiple core specimens.  Specimens with calcifications are identified for pathology.  At the conclusion of the procedure, a top hat shaped tissue marker clip was deployed into the biopsy cavity.  Follow-up 2-view mammogram confirmed clip placement and removal of the calcifications.  IMPRESSION: Stereotactic-guided biopsy of calcifications in the right upper outer quadrant at approximately 9 o'clock, 9 cm from the right nipple.  No apparent complications.  Original Report Authenticated By: Daryl Eastern, M.D.   Mm Breast Surgical Specimen  03/18/2011  *RADIOLOGY REPORT*  Clinical Data:  Microcalcifications in the right upper outer quadrant.  This biopsy is of calcifications in the right subareolar region, 1 cm deep to the nipple.  STEREOTACTIC-GUIDED VACUUM ASSISTED BIOPSY OF THE RIGHT BREAST AND SPECIMEN RADIOGRAPH  The patient and I discussed the procedure of stereotactic-guided biopsy, including benefits and alternatives.  We discussed the high likelihood of a successful procedure. We discussed the risks of the procedure, including infection, bleeding, tissue injury, clip migration and inadequate sampling.  Informed written consent was given.  Using sterile technique, 2% lidocaine, stereotactic guidance and a 9 gauge vacuum assisted device, biopsy was performed of the calcifications in the subareolar region, 1 cm deep to the right nipple.  Specimen radiograph was performed, showing multiple calcifications within multiple core specimens.  Specimens with calcifications are identified for pathology.  At the conclusion of the procedure, a dumbbell shaped tissue marker clip was deployed into the biopsy cavity.  Follow-up 2-view mammogram confirmed clip placement and removal of the calcifications.  IMPRESSION: Stereotactic-guided biopsy of microcalcifications in the right subareolar region, 1 cm deep to the nipple.  No apparent complications.  Original  Report Authenticated By: Daryl Eastern, M.D.   Mm Breast Surgical Specimen  03/18/2011  *RADIOLOGY REPORT*  Clinical Data:  Microcalcifications in the right upper outer quadrant.  This biopsy is of calcifications at approximately 9 o'clock, 9 cm from the right nipple.  STEREOTACTIC-GUIDED VACUUM ASSISTED BIOPSY OF THE RIGHT BREAST AND SPECIMEN RADIOGRAPH  The patient and I discussed the procedure of stereotactic-guided biopsy, including benefits and alternatives.  We discussed the high likelihood of a successful procedure. We discussed the risks of the procedure, including infection, bleeding, tissue injury, clip migration and inadequate sampling.  Informed written consent was given.  Using sterile technique, 2% lidocaine, stereotactic guidance and a 9 gauge vacuum assisted device, biopsy was performed of the calcifications at approximately 9 o'clock, 9 cm from the right nipple.  Specimen radiograph was performed, showing multiple calcifications within multiple core specimens.  Specimens with calcifications are identified for pathology.  At the conclusion of the procedure, a top hat shaped tissue marker clip was deployed into the biopsy cavity.  Follow-up 2-view mammogram confirmed clip placement and removal of the calcifications.  IMPRESSION: Stereotactic-guided biopsy of calcifications in the right upper outer quadrant at approximately 9 o'clock,  9 cm from the right nipple.  No apparent complications.  Original Report Authenticated By: Daryl Eastern, M.D.   Mm Digital Diagnostic Bilat  03/08/2011  *RADIOLOGY REPORT*  Clinical Data:  The patient returns for evaluation of calcifications in the right breast.  DIGITAL DIAGNOSTIC BILATERAL MAMMOGRAM WITH CAD  Comparison:  02/04/2010 and earlier  Findings:  There are scattered fibroglandular densities.  Magnified views are performed of calcifications throughout the upper-outer quadrant of the right breast.  Calcifications vary in size shape and density  and extend from the subareolar region posteriorly, measuring approximately 9 x 6 cm.  Biopsy is suggested to exclude malignancy.  I would recommend biopsy of an anterior cluster and posterior cluster. Images of the left breast are unremarkable. Mammographic images were processed with CAD.  IMPRESSION: Suspicious microcalcifications in the right breast.  Stereotactic guided core biopsy of two sites has been scheduled on 03/17/2011.  BI-RADS CATEGORY 4:  Suspicious abnormality - biopsy should be considered.  Original Report Authenticated By: Patterson Hammersmith, M.D.   Mm Radiologist Eval And Mgmt  03/18/2011  *RADIOLOGY REPORT*  ESTABLISHED PATIENT OFFICE VISIT - LEVEL II [phone])  Chief Complaint:  The patient returns for discussion of the pathologic findings after core needle biopsy of two sites of calcifications in the right upper outer quadrant on 03/17/2011.  History:  Recent mammogram demonstrated calcifications in the right upper outer quadrant. Stereotactic core needle biopsyof the anterior calcifications located in the right subareolar region, approximately 1 cm deep to the nipple and of the posterior calcifications located at approximately 9 o'clock 9 cm from the right nipple was performed.  The clips marking the biopsy sites are approximately 7.7 cm apart.  Exam:  On physical examination of the biopsy sites, no hematoma or infection is identified.  The patient has some tenderness at the anterior biopsy site.  The incisions are clean and dry.  Assessment and Plan:  Histologic evaluation demonstrates ductal carcinoma in situ with associated calcifications at each site. Fibrocystic changes are also present.  This is concordant with the imaging findings.  Findings were discussed with the patient and her daughter.  The patient was scheduled for breast MRI on 03/22/2011. She was scheduled to be seen in the [facility] on 03/23/2011.  Questions were answered. Educational  materials were given.  Original Report Authenticated By: Daryl Eastern, M.D.     LABS:    Chemistry      Component Value Date/Time   NA 140 03/23/2011 1218   K 4.1 03/23/2011 1218   CL 104 03/23/2011 1218   CO2 30 03/23/2011 1218   BUN 16 03/23/2011 1218   CREATININE 0.61 03/23/2011 1218      Component Value Date/Time   CALCIUM 9.8 03/23/2011 1218   ALKPHOS 95 03/23/2011 1218   AST 33 03/23/2011 1218   ALT 41* 03/23/2011 1218   BILITOT 0.6 03/23/2011 1218      Lab Results  Component Value Date   WBC 8.3 03/23/2011   HGB 14.7 03/23/2011   HCT 44.1 03/23/2011   MCV 95.4 03/23/2011   PLT 171 03/23/2011       PATHOLOGY: FINAL DIAGNOSIS Diagnosis 1. Breast, right, needle core biopsy, 9 o'clock, 9 cm from nipple - DUCTAL CARCINOMA IN SITU WITH ASSOCIATED CALCIFICATION. - SEE COMMENT. 2. Breast, right, needle core biopsy, subareolar, 1 cm deep to nipple - DUCTAL CARCINOMA IN SITU WITH ASSOCIATED CALCIFICATION. - SEE COMMENT. Microscopic Comment 1. ,2. Fibrocystic changes are also  present in both biopsies. ER and PR will be performed on both biopsies and reported in an addendum. The findings are called to [facility] on 03/18/11. Dr. Colonel Bald has seen both biopsies in consultation with agreement. (RAH:gt, 03/18/11) Zandra Abts MD Pathologist, Electronic Signature (Case signed 03/18/2011) Specimen Gross and Clinical Inf  ASSESSMENT    76 year old female with  #1 ductal carcinoma in situ measuring up to 9 cm with possible please suspicious area of invasive component by MRI. Patient is status post needle core biopsy that showed DCIS ER positive PR positive. Patient is seen in the multidisciplinary breast clinic for discussion of her treatment options. Due to the size of the area involved patient would be a candidate for a mastectomy with sentinel node biopsy. This was discussed with the patient and her for a her family in detail.  #2 since patient's tumor is  estrogen receptor positive we did discuss the possibility of doing chemotherapy prevention with an antiestrogen agent such as tamoxifen or even Aromasin for prevention of breast cancer in the contralateral breast. We did discuss the risks and benefits of this type of treatment.  #3 patient was seen by Dr. Doristine Devoid as well as Dr. Emelia Loron.  Number for discussion of reconstruction was done and patient will be referred to plastics for consultation.  PLAN:    #1 patient will proceed with first surgery up front.  #2 once patient completes her surgery I will plan on seeing her back to discuss the role of the rationale for antiestrogen therapy for the contralateral breast. Certainly if her final pathology changes then we would discuss other options especially if there is a large focus of invasive cancer.  #3 patient will be seen back in about 4-6 weeks' time for followup. However she knows to call with any problems questions or concerns in the meantime.       Discussion: Patient is being treated per NCCN breast cancer care guidelines appropriate for stage.0   Thank you so much for allowing me to participate in the care of Selena [name]. I will continue to follow up the patient with you and assist in her care.  All questions were answered. The patient knows to call the clinic with any problems, questions or concerns. We can certainly see the patient much sooner if necessary.  I spent 60 minutes counseling the patient face to face. The total time spent in the appointment was 60 minutes.  Drue Second, MD Medical/Oncology [facility] [phone] (beeper) [phone] (Office)  03/23/2011, 5:33 PM 03/23/2011, 5:33 PM

## 2011-03-23 NOTE — Progress Notes (Signed)
Patient ID: Selena [name], female   DOB: [birth year], 76 y.o.   MRN: [id number]  Chief Complaint  Patient presents with  . Other    Breast cancer    HPI Selena [name] is a 76 y.o. female.  Referred by Dr. Cain Saupe HPI This is a healthy active 76 year old female underwent a routine screening mammogram in February 2013 which showed suspicious microcalcifications in the right breast. They extend from the subareolar region posteriorly measuring approximate 9 x 6 cm. She underwent stereotactic guided biopsy of the calcifications at the anterior and posterior extent of these areas with clip placement in both of them. She is also since undergone an MRI which shows diffuse clumped linear enhancement extending from the subareolar portion of the right breast posteriorly for about 9.5 cm. She also has a 1.2 cm irregular enhancing mass located within the central superior right breast at the 12:00 position that is worrisome for invasive cancer. There are no left breast abnormalities and there are no axillary or internal mammary adenopathy. Her pathology of both lesions shows ductal carcinoma in situ with associated calcifications. These are both estrogen and progesterone receptor positive. They're intermediate grade. She reports no complaints referable to her breasts and has no history of any breast complaints either. She comes in today to discuss her options for her newly diagnosed breast cancer.  Past Medical History  Diagnosis Date  . Atrial fibrillation 01/01/2007  . BACK PAIN, UPPER 07/09/2008  . DIVERTICULOSIS, COLON 12/26/2006  . LIVER FUNCTION TESTS, ABNORMAL, HX OF 12/26/2007  . Memory loss 05/16/2007  . OSTEOARTHRITIS 12/26/2006  . VERTIGO 09/07/2009  . Cataract   . Hypertension   . Goiter     multi-nodular  . Skin cancer   . Hx of colonoscopy 2009  . Wears glasses   . Wears hearing aid   . Wears dentures   . SOB (shortness of breath) on exertion     Past Surgical History  Procedure  Date  . Abdominal hysterectomy   . Rotator cuff repair     right  . Knee surgery     arthroscopic/ bilateral knee replacements  . Cataract extraction w/ intraocular lens  implant, bilateral   . Colonoscopy   . Appendectomy   . Ablation of dysrhythmic focus 2009    Family History  Problem Relation Age of Onset  . Colon cancer Mother   . Colon cancer Maternal Aunt   . Cancer Maternal Uncle   . Prostate cancer Maternal Uncle   . Prostate cancer Maternal Uncle   . Other Maternal Uncle     Social History History  Substance Use Topics  . Smoking status: Former Smoker    Quit date: 01/25/1968  . Smokeless tobacco: Never Used  . Alcohol Use: No    Allergies  Allergen Reactions  . Codeine   . Codeine Phosphate     Current Outpatient Prescriptions  Medication Sig Dispense Refill  . aspirin 81 MG tablet Take 81 mg by mouth daily.        . calcium carbonate (TUMS EX) 750 MG chewable tablet Chew 1 tablet by mouth daily.      . chlorhexidine (PERIDEX) 0.12 % solution Take 1 oz by mouth Daily.      . diclofenac (VOLTAREN) 75 MG EC tablet Take 1 tablet (75 mg total) by mouth 2 (two) times daily.  180 tablet  6  . peg 3350 powder (MOVIPREP) 100 G SOLR MOVI Prep take as directed  1 kit  0  . promethazine (PHENERGAN) 50 MG tablet Take 1 tablet (50 mg total) by mouth every 6 (six) hours as needed for nausea.  20 tablet  4  . promethazine-dextromethorphan (PROMETHAZINE-DM) 6.25-15 MG/5ML syrup Take 5 mLs by mouth 4 (four) times daily as needed.  120 mL  0   Current Facility-Administered Medications  Medication Dose Route Frequency Provider Last Rate Last Dose  . 0.9 %  sodium chloride infusion  500 mL Intravenous Continuous Louis Meckel, MD        Review of Systems Review of Systems  Constitutional: Negative for fever, chills and unexpected weight change.  HENT: Negative for hearing loss, congestion, sore throat, trouble swallowing and voice change.   Eyes: Positive for visual  disturbance.  Respiratory: Positive for shortness of breath (when walking up stairs). Negative for cough and wheezing.   Cardiovascular: Negative for chest pain, palpitations and leg swelling.  Gastrointestinal: Negative for nausea, vomiting, abdominal pain, diarrhea, constipation, blood in stool, abdominal distention and anal bleeding.  Genitourinary: Negative for hematuria, vaginal bleeding and difficulty urinating.  Musculoskeletal: Negative for arthralgias.  Skin: Negative for rash and wound.  Neurological: Negative for seizures, syncope and headaches.  Hematological: Negative for adenopathy. Bruises/bleeds easily.  Psychiatric/Behavioral: Negative for confusion.    There were no vitals taken for this visit.  Physical Exam Physical Exam  Vitals reviewed. Constitutional: She appears well-developed and well-nourished.  Eyes: Conjunctivae are normal. No scleral icterus.  Neck: Neck supple.  Cardiovascular: Normal rate, regular rhythm and normal heart sounds.   Pulmonary/Chest: Effort normal and breath sounds normal. She has no wheezes. She has no rales. Right breast exhibits no inverted nipple, no mass, no nipple discharge, no skin change and no tenderness. Left breast exhibits no inverted nipple, no mass, no nipple discharge, no skin change and no tenderness. Breasts are symmetrical.  Abdominal: Soft. There is no hepatomegaly.  Lymphadenopathy:    She has no cervical adenopathy.    She has no axillary adenopathy.       Right: No supraclavicular adenopathy present.       Left: No supraclavicular adenopathy present.    Data Reviewed BILATERAL BREAST MRI WITH AND WITHOUT CONTRAST  Technique: Multiplanar, multisequence MR images of both breasts  were obtained prior to and following the intravenous administration  of 19ml of Multihance. Three dimensional images were evaluated at  the independent DynaCad workstation.  Comparison: 03/17/2011, 03/08/2011, 02/04/2010, 05/26/2009,    11/24/2008, 11/17/2008, 11/07/2007 mammograms.  Findings: Non mass clumped linear enhancement is seen extending  from the subareolar portion of the right breast posteriorly into  the posterior third of the right breast. This corresponds to the  distribution of the numerous microcalcifications within the right  breast. There is also enhancement posteriorly within the right  breast which in part may be secondary to post biopsy change. The  anterior- posterior extent of the enhancement measures 9.5 cm. The  distance between the clips placed during the stereotactic biopsies  measures 7.8 cm. In addition, there is an enhancing mass located  centrally and superiorly within the middle third of the right  breast at approximately the 12 o'clock position which measures 1.2  x 1.0 x 1.0 cm in size and is associated with a washout enhancement  curve worrisome for an invasive carcinoma. There are no foci of  worrisome enhancement within the left breast. There is no evidence  for axillary or internal mammary adenopathy and there are no  additional findings.  IMPRESSION:  1.  Diffuse clumped linear enhancement consistent with DCIS  extending from the subareolar portion of the right breast  posteriorly for approximately 9.5 cm. This corresponds to the  recently biopsied diffuse pleomorphic calcifications present within  the right breast.  2. 1.2 cm irregular enhancing mass located within the central  superior right breast at the 12 o'clock position worrisome for  invasive carcinoma.  3. No additional findings.   Assessment    Right breast DCIS    Plan    I recommended right simple mastectomy, right axillary sentinel node biopsy and possible immediate reconstruction.  She will see Dr. Odis Luster Friday for evaluation for reconstruction then will schedule.   We discussed the staging and pathophysiology of breast cancer. We discussed all of the different options for treatment for breast cancer  including surgery, chemotherapy, radiation therapy, Herceptin, and antiestrogen therapy.   We discussed a sentinel lymph node biopsy as she does not appear to having lymph node involvement right now. We discussed the performance of that with injection of radioactive tracer and blue dye. We discussed intraoperative pathology and proceeding with axillary dissection if she has nodal involvement.  She only has DCIS right now but due to size and other lesion in right breast will plan on proceeding this way.  We discussed about a 1-2% risk lifetime of chronic shoulder pain as well as lymphedema associated with a sentinel lymph node biopsy.  We discussed a higher risk with axillary dissection. We discussed the options for treatment of the breast cancer which included lumpectomy versus a mastectomy. We discussed the performance of the lumpectomy. We discussed the mastectomy and the postoperative care for that as well. We discussed that there is no difference in her survival whether she undergoes lumpectomy with radiation therapy or antiestrogen therapy versus a mastectomy. There is a slight difference in the local recurrence rate being 3-5% with lumpectomy and about 1% with a mastectomy. I do not think with the two areas of DCIS, the third unbiopsied area that anything but a mastectomy is an option.  I reviewed her films with her as well as the distance between the known cancers.  She is agreeable to proceeding after evaluation for reconstruction.  She is very healthy active woman and I think this would be reasonable. We discussed the risks of operation including bleeding, infection, possible reoperation. She understands her further therapy will be based on what her stages at the time of her operation.         Janet Decesare 03/23/2011, 4:38 PM

## 2011-03-23 NOTE — Progress Notes (Signed)
[facility] Radiation Oncology NEW PATIENT EVALUATION  Name: Selena [name] MRN: [id number]  Date: 03/23/2011  DOB: 1935-02-19  Status: outpatient   REFERRING PHYSICIAN: Emelia Loron, MD   DIAGNOSIS: DCIS of the central right breast    HISTORY OF PRESENT ILLNESS:  Selena [name] is a 76 y.o. female who is here today in the breast clinic due to a new diagnosis of DCIS of the right breast..This was found ons creening mammogram. 2 stereotactic biopsies were obtained - both were positive for DCIS, ER and PR positive. She is status post MRI which was performed on 03/22/2011. The MRI showed diffuse clumped linear enhancement consistent with DCIS extending from the subareolar portion of the right breast posteriorly for approximately 9.5 cm. There is also a 1.2 cm in irregular enhancing mass within the central superior right breast at the 12:00 position. This was worrisome for invasive carcinoma but it has not been biopsied.  She is otherwise in her usual state of health.   PAST MEDICAL HISTORY:  has a past medical history of Atrial fibrillation (01/01/2007); BACK PAIN, UPPER (07/09/2008); DIVERTICULOSIS, COLON (12/26/2006); LIVER FUNCTION TESTS, ABNORMAL, HX OF (12/26/2007); Memory loss (05/16/2007); OSTEOARTHRITIS (12/26/2006); VERTIGO (09/07/2009); Cataract; Hypertension; Goiter; Skin cancer; colonoscopy (2009); bone density study; Wears glasses; Wears hearing aid; Wears dentures; and SOB (shortness of breath) on exertion.     PAST SURGICAL HISTORY:  Past Surgical History  Procedure Date  . Abdominal hysterectomy   . Rotator cuff repair     right  . Knee surgery     arthroscopic/ bilateral knee replacements  . Cataract extraction w/ intraocular lens  implant, bilateral   . Colonoscopy   . Appendectomy   . Ablation of dysrhythmic focus 2009     FAMILY HISTORY: family history includes Cancer in her maternal uncle; Colon cancer in her maternal aunt and mother; Other in her  maternal uncle; and Prostate cancer in her maternal uncles.   SOCIAL HISTORY:  reports that she quit smoking about 43 years ago. She has never used smokeless tobacco. She reports that she does not drink alcohol or use illicit drugs.   ALLERGIES: Codeine and Codeine phosphate   MEDICATIONS:  Current Outpatient Prescriptions  Medication Sig Dispense Refill  . aspirin 81 MG tablet Take 81 mg by mouth daily.        . calcium carbonate (TUMS EX) 750 MG chewable tablet Chew 1 tablet by mouth daily.      . chlorhexidine (PERIDEX) 0.12 % solution Take 1 oz by mouth Daily.      . diclofenac (VOLTAREN) 75 MG EC tablet Take 1 tablet (75 mg total) by mouth 2 (two) times daily.  180 tablet  6  . peg 3350 powder (MOVIPREP) 100 G SOLR MOVI Prep take as directed  1 kit  0  . promethazine (PHENERGAN) 50 MG tablet Take 1 tablet (50 mg total) by mouth every 6 (six) hours as needed for nausea.  20 tablet  4  . promethazine-dextromethorphan (PROMETHAZINE-DM) 6.25-15 MG/5ML syrup Take 5 mLs by mouth 4 (four) times daily as needed.  120 mL  0   Current Facility-Administered Medications  Medication Dose Route Frequency Provider Last Rate Last Dose  . 0.9 %  sodium chloride infusion  500 mL Intravenous Continuous Louis Meckel, MD         REVIEW OF SYSTEMS:  A comprehensive 14 point review of systems as obtained and reviewed with the patient and placed in the patient's chart. It  is notable for that above.   PHYSICAL EXAM:  She has a blood pressure of 172/90, pulse 69, temperature 90.8 degrees, weight 207 pounds, height 5 foot 7.5 inches General: Alert and oriented, in no acute distress HEENT: Head is normocephalic. Pupils are equally round and reactive to light. Extraocular movements are intact. Oropharynx is clear. Neck: Neck is supple, no palpable cervical or supraclavicular lymphadenopathy. Heart: Regular in rate and rhythm with no murmurs, rubs, or gallops. Chest: Clear to auscultation bilaterally,  with no rhonchi, wheezes, or rales. Abdomen: Soft, nontender, nondistended, with no rigidity or guarding. Extremities: No cyanosis; but there is trace ankle edema. Lymphatics: No concerning lymphadenopathy. Skin: No concerning lesions. Musculoskeletal: symmetric strength and muscle tone throughout. Neurologic: Cranial nerves II through XII are grossly intact. No obvious focalities. Speech is fluent. Coordination is intact. Psychiatric: Judgment and insight are intact. Affect is appropriate. Breasts: There is post biopsy ecchymosis in the upper outer quadrant of the right breast. There are no palpable tumors or axillary nodes bilaterally   LABORATORY DATA:  Lab Results  Component Value Date   WBC 8.3 03/23/2011   HGB 14.7 03/23/2011   HCT 44.1 03/23/2011   MCV 95.4 03/23/2011   PLT 171 03/23/2011   Lab Results  Component Value Date   NA 140 03/23/2011   K 4.1 03/23/2011   CL 104 03/23/2011   CO2 30 03/23/2011   Lab Results  Component Value Date   ALT 41* 03/23/2011   AST 33 03/23/2011   ALKPHOS 95 03/23/2011   BILITOT 0.6 03/23/2011   PATHOLOGY: As above   RADIOLOGY: As above   IMPRESSION/PLAN: This is a very pleasant 76 year old woman with DCIS of the right breast. Her imaging demonstrates that her disease is fairly extensive within her right breast. 2 stereotactic biopsies have been positive for DCIS. Ultimately, after she was discussed in breast tumor Board, it was agreed that mastectomy and sentinel lymph node biopsy would be the best surgical modality to clear her disease. I explained to the patient that it is unlikely she will need any adjuvant radiotherapy. However if my opinion is needed postoperatively I am available to see her in my clinic in the future. I wished her the very best.

## 2011-03-23 NOTE — Patient Instructions (Addendum)
1. You will have your surgery first. 2. I will see you back in 6 weeks time

## 2011-03-23 NOTE — Telephone Encounter (Signed)
gave patient appointment for six weeks 04-2011 printed out calendar and gave to the patient

## 2011-03-24 ENCOUNTER — Encounter: Payer: Self-pay | Admitting: *Deleted

## 2011-03-24 NOTE — Progress Notes (Signed)
Mailed after appt letter to pt. 

## 2011-03-29 ENCOUNTER — Telehealth: Payer: Self-pay | Admitting: *Deleted

## 2011-03-29 ENCOUNTER — Encounter: Payer: Self-pay | Admitting: *Deleted

## 2011-03-29 NOTE — Telephone Encounter (Signed)
Spoke with pt concerning [facility] from 2/27.  Pt denies questions or concerns regarding dx or treatment care plan.  Encourage pt to call with needs.  Received verbal understanding.  Contact information given.

## 2011-03-30 ENCOUNTER — Encounter: Payer: Self-pay | Admitting: *Deleted

## 2011-03-30 ENCOUNTER — Telehealth: Payer: Self-pay | Admitting: Oncology

## 2011-03-30 NOTE — Telephone Encounter (Signed)
S/w the pt and she is aware of her r/s April appts to may due to the md's schedule

## 2011-03-31 ENCOUNTER — Telehealth: Payer: Self-pay | Admitting: *Deleted

## 2011-03-31 NOTE — Telephone Encounter (Signed)
called patient at [phone] to inform the patient of the new date and time no answering machine will continue to call back

## 2011-04-06 ENCOUNTER — Encounter (HOSPITAL_COMMUNITY): Payer: Self-pay | Admitting: Pharmacy Technician

## 2011-04-07 ENCOUNTER — Other Ambulatory Visit (HOSPITAL_COMMUNITY): Payer: Self-pay | Admitting: *Deleted

## 2011-04-07 ENCOUNTER — Encounter (HOSPITAL_COMMUNITY): Payer: Self-pay

## 2011-04-07 ENCOUNTER — Encounter (HOSPITAL_COMMUNITY)
Admission: RE | Admit: 2011-04-07 | Discharge: 2011-04-07 | Disposition: A | Discharge status: Home / Self Care | Payer: Medicare Other | Source: Ambulatory Visit | Attending: General Surgery | Admitting: General Surgery

## 2011-04-07 HISTORY — DX: Adverse effect of unspecified anesthetic, initial encounter: T41.45XA

## 2011-04-07 HISTORY — DX: Spontaneous ecchymoses: R23.3

## 2011-04-07 HISTORY — DX: Other specified postprocedural states: Z98.890

## 2011-04-07 HISTORY — DX: Other complications of anesthesia, initial encounter: T88.59XA

## 2011-04-07 HISTORY — DX: Other specified postprocedural states: R11.2

## 2011-04-07 HISTORY — DX: Other skin changes: R23.8

## 2011-04-07 HISTORY — DX: Diverticulitis of intestine, part unspecified, without perforation or abscess without bleeding: K57.92

## 2011-04-07 LAB — BASIC METABOLIC PANEL
BUN: 15 mg/dL (ref 6–23)
CO2: 29 mEq/L (ref 19–32)
Chloride: 102 mEq/L (ref 96–112)
Creatinine, Ser: 0.59 mg/dL (ref 0.50–1.10)
Glucose, Bld: 97 mg/dL (ref 70–99)
Potassium: 4.1 mEq/L (ref 3.5–5.1)

## 2011-04-07 LAB — CBC
HCT: 46 % (ref 36.0–46.0)
Hemoglobin: 16 g/dL — ABNORMAL HIGH (ref 12.0–15.0)
MCHC: 34.8 g/dL (ref 30.0–36.0)
MCV: 93.1 fL (ref 78.0–100.0)
RDW: 12.7 % (ref 11.5–15.5)

## 2011-04-07 NOTE — Progress Notes (Addendum)
Cardiologist is Dr.Klein and last visit was in 2009  Stress test done in 2009 Echo in epic 2008/2011 Denies having a heart cath  Medical MD is Dr.Kwiatkowski

## 2011-04-07 NOTE — Pre-Procedure Instructions (Signed)
20 VALLERI HENDRICKSEN  04/07/2011   Your procedure is scheduled on:  Thurs,Mar 21 @ 1230pm  Report to [facility] at 1030 AM.  Call this number if you have problems the morning of surgery: [phone]   Remember:   Do not eat food:After Midnight.  May have clear liquids: up to 4 Hours before arrival.(until 6:30 am)  Clear liquids include soda, tea, black coffee, apple or grape juice, broth,water  Take these medicines the morning of surgery with A SIP OF WATER:    Do not wear jewelry, make-up or nail polish.  Do not wear lotions, powders, or perfumes. You may wear deodorant.  Do not shave 48 hours prior to surgery.  Do not bring valuables to the hospital.  Contacts, dentures or bridgework may not be worn into surgery.  Leave suitcase in the car. After surgery it may be brought to your room.  For patients admitted to the hospital, checkout time is 11:00 AM the day of discharge.    Special Instructions: CHG Shower Use Special Wash: 1/2 bottle night before surgery and 1/2 bottle morning of surgery.   Please read over the following fact sheets that you were given: Pain Booklet, Coughing and Deep Breathing, MRSA Information and Surgical Site Infection Prevention

## 2011-04-13 MED ORDER — CEFAZOLIN SODIUM-DEXTROSE 2-3 GM-% IV SOLR
2.0000 g | INTRAVENOUS | Status: AC
  Administered 2011-04-14: 2 g via INTRAVENOUS
  Filled 2011-04-13: qty 50

## 2011-04-14 ENCOUNTER — Ambulatory Visit (HOSPITAL_COMMUNITY): Payer: Medicare Other | Admitting: Anesthesiology

## 2011-04-14 ENCOUNTER — Encounter (HOSPITAL_COMMUNITY): Payer: Self-pay | Admitting: Anesthesiology

## 2011-04-14 ENCOUNTER — Ambulatory Visit (HOSPITAL_COMMUNITY)
Admission: RE | Admit: 2011-04-14 | Discharge: 2011-04-14 | Disposition: A | Discharge status: Home / Self Care | Payer: Medicare Other | Source: Ambulatory Visit | Attending: General Surgery | Admitting: General Surgery

## 2011-04-14 ENCOUNTER — Observation Stay (HOSPITAL_COMMUNITY)
Admission: RE | Admit: 2011-04-14 | Discharge: 2011-04-17 | Disposition: A | Discharge status: Home / Self Care | Payer: Medicare Other | Source: Ambulatory Visit | Attending: General Surgery | Admitting: General Surgery

## 2011-04-14 ENCOUNTER — Encounter (HOSPITAL_COMMUNITY)
Admission: RE | Disposition: A | Discharge status: Home / Self Care | Payer: Self-pay | Source: Ambulatory Visit | Attending: General Surgery

## 2011-04-14 DIAGNOSIS — Z01812 Encounter for preprocedural laboratory examination: Secondary | ICD-10-CM | POA: Insufficient documentation

## 2011-04-14 DIAGNOSIS — C50919 Malignant neoplasm of unspecified site of unspecified female breast: Secondary | ICD-10-CM

## 2011-04-14 DIAGNOSIS — I10 Essential (primary) hypertension: Secondary | ICD-10-CM | POA: Insufficient documentation

## 2011-04-14 DIAGNOSIS — D0590 Unspecified type of carcinoma in situ of unspecified breast: Principal | ICD-10-CM | POA: Insufficient documentation

## 2011-04-14 DIAGNOSIS — R0602 Shortness of breath: Secondary | ICD-10-CM | POA: Insufficient documentation

## 2011-04-14 DIAGNOSIS — C50119 Malignant neoplasm of central portion of unspecified female breast: Secondary | ICD-10-CM

## 2011-04-14 DIAGNOSIS — I4891 Unspecified atrial fibrillation: Secondary | ICD-10-CM | POA: Insufficient documentation

## 2011-04-14 DIAGNOSIS — K08109 Complete loss of teeth, unspecified cause, unspecified class: Secondary | ICD-10-CM | POA: Insufficient documentation

## 2011-04-14 HISTORY — PX: BREAST RECONSTRUCTION: SHX9

## 2011-04-14 SURGERY — SIMPLE MASTECTOMY WITH AXILLARY SENTINEL NODE BIOPSY
Anesthesia: General | Site: Breast | Laterality: Right | Wound class: Clean

## 2011-04-14 MED ORDER — SODIUM CHLORIDE 0.9 % IV SOLN
INTRAVENOUS | Status: DC
  Administered 2011-04-14 – 2011-04-16 (×4): via INTRAVENOUS

## 2011-04-14 MED ORDER — DOCUSATE SODIUM 100 MG PO CAPS
100.0000 mg | ORAL_CAPSULE | Freq: Two times a day (BID) | ORAL | Status: DC
  Administered 2011-04-14 – 2011-04-17 (×6): 100 mg via ORAL
  Filled 2011-04-14 (×6): qty 1

## 2011-04-14 MED ORDER — PROMETHAZINE HCL 25 MG/ML IJ SOLN: 6.2500 mg | INTRAMUSCULAR | Status: DC | PRN

## 2011-04-14 MED ORDER — SODIUM CHLORIDE 0.9 % IR SOLN
Status: DC | PRN
  Administered 2011-04-14: 2000 mL

## 2011-04-14 MED ORDER — ACETAMINOPHEN 325 MG PO TABS: 650.0000 mg | ORAL_TABLET | Freq: Four times a day (QID) | ORAL | Status: DC | PRN

## 2011-04-14 MED ORDER — BIOTENE DRY MOUTH MT LIQD
15.0000 mL | Freq: Two times a day (BID) | OROMUCOSAL | Status: DC
  Administered 2011-04-15 – 2011-04-16 (×2): 15 mL via OROMUCOSAL

## 2011-04-14 MED ORDER — ACETAMINOPHEN 325 MG PO TABS: 325.0000 mg | ORAL_TABLET | ORAL | Status: DC | PRN

## 2011-04-14 MED ORDER — ONDANSETRON HCL 4 MG/2ML IJ SOLN: 4.0000 mg | Freq: Four times a day (QID) | INTRAMUSCULAR | Status: DC | PRN

## 2011-04-14 MED ORDER — ROCURONIUM BROMIDE 100 MG/10ML IV SOLN
INTRAVENOUS | Status: DC | PRN
  Administered 2011-04-14: 50 mg via INTRAVENOUS

## 2011-04-14 MED ORDER — FENTANYL CITRATE 0.05 MG/ML IJ SOLN
INTRAMUSCULAR | Status: AC
  Administered 2011-04-14: 50 ug via INTRAVENOUS
  Filled 2011-04-14: qty 2

## 2011-04-14 MED ORDER — FENTANYL CITRATE 0.05 MG/ML IJ SOLN
INTRAMUSCULAR | Status: DC | PRN
  Administered 2011-04-14 (×2): 50 ug via INTRAVENOUS
  Administered 2011-04-14: 250 ug via INTRAVENOUS

## 2011-04-14 MED ORDER — LACTATED RINGERS IV SOLN
INTRAVENOUS | Status: DC | PRN
  Administered 2011-04-14 (×4): via INTRAVENOUS

## 2011-04-14 MED ORDER — FENTANYL CITRATE 0.05 MG/ML IJ SOLN
50.0000 ug | INTRAMUSCULAR | Status: DC | PRN
  Administered 2011-04-14: 100 ug via INTRAVENOUS

## 2011-04-14 MED ORDER — DOCUSATE SODIUM 100 MG PO CAPS: 100.0000 mg | ORAL_CAPSULE | Freq: Every day | ORAL | Status: DC

## 2011-04-14 MED ORDER — MIDAZOLAM HCL 5 MG/5ML IJ SOLN
INTRAMUSCULAR | Status: DC | PRN
  Administered 2011-04-14: 2 mg via INTRAVENOUS

## 2011-04-14 MED ORDER — TECHNETIUM TC 99M SULFUR COLLOID FILTERED
1.0000 | Freq: Once | INTRAVENOUS | Status: AC | PRN
  Administered 2011-04-14: 1 via INTRADERMAL

## 2011-04-14 MED ORDER — NEOSTIGMINE METHYLSULFATE 1 MG/ML IJ SOLN
INTRAMUSCULAR | Status: DC | PRN
  Administered 2011-04-14: 5 mg via INTRAVENOUS

## 2011-04-14 MED ORDER — HYDROCODONE-ACETAMINOPHEN 5-325 MG PO TABS
1.0000 | ORAL_TABLET | ORAL | Status: DC | PRN
  Administered 2011-04-15 – 2011-04-17 (×11): 2 via ORAL
  Filled 2011-04-14 (×11): qty 2

## 2011-04-14 MED ORDER — DEXAMETHASONE SODIUM PHOSPHATE 4 MG/ML IJ SOLN
INTRAMUSCULAR | Status: DC | PRN
  Administered 2011-04-14: 8 mg via INTRAVENOUS

## 2011-04-14 MED ORDER — HYDROMORPHONE HCL PF 1 MG/ML IJ SOLN
1.0000 mg | INTRAMUSCULAR | Status: DC | PRN
  Administered 2011-04-14: 1 mg via INTRAVENOUS
  Filled 2011-04-14: qty 1

## 2011-04-14 MED ORDER — KETOROLAC TROMETHAMINE 30 MG/ML IJ SOLN
15.0000 mg | Freq: Once | INTRAMUSCULAR | Status: AC | PRN
  Administered 2011-04-14: 30 mg via INTRAVENOUS

## 2011-04-14 MED ORDER — ONDANSETRON HCL 4 MG/2ML IJ SOLN
INTRAMUSCULAR | Status: DC | PRN
  Administered 2011-04-14: 4 mg via INTRAVENOUS

## 2011-04-14 MED ORDER — MEPERIDINE HCL 25 MG/ML IJ SOLN: 6.2500 mg | INTRAMUSCULAR | Status: DC | PRN

## 2011-04-14 MED ORDER — CEFAZOLIN SODIUM 1-5 GM-% IV SOLN
1.0000 g | Freq: Three times a day (TID) | INTRAVENOUS | Status: DC
  Administered 2011-04-14 – 2011-04-17 (×8): 1 g via INTRAVENOUS
  Filled 2011-04-14 (×10): qty 50

## 2011-04-14 MED ORDER — LACTATED RINGERS IV SOLN
INTRAVENOUS | Status: DC
  Administered 2011-04-14: 12:00:00 via INTRAVENOUS

## 2011-04-14 MED ORDER — MIDAZOLAM HCL 2 MG/2ML IJ SOLN: 0.5000 mg | Freq: Once | INTRAMUSCULAR | Status: DC | PRN

## 2011-04-14 MED ORDER — METHOCARBAMOL 500 MG PO TABS
500.0000 mg | ORAL_TABLET | Freq: Four times a day (QID) | ORAL | Status: DC
  Administered 2011-04-14 – 2011-04-17 (×10): 500 mg via ORAL
  Filled 2011-04-14 (×13): qty 1

## 2011-04-14 MED ORDER — SODIUM CHLORIDE 0.9 % IJ SOLN
INTRAMUSCULAR | Status: DC | PRN
  Administered 2011-04-14: 15:00:00 via INTRAMUSCULAR

## 2011-04-14 MED ORDER — GLYCOPYRROLATE 0.2 MG/ML IJ SOLN
INTRAMUSCULAR | Status: DC | PRN
  Administered 2011-04-14: .7 mg via INTRAVENOUS

## 2011-04-14 MED ORDER — POLYMYXIN B SULFATE 500000 UNITS IJ SOLR: INTRAMUSCULAR | Status: DC | PRN

## 2011-04-14 MED ORDER — PROPOFOL 10 MG/ML IV EMUL
INTRAVENOUS | Status: DC | PRN
  Administered 2011-04-14: 150 mg via INTRAVENOUS

## 2011-04-14 MED ORDER — FENTANYL CITRATE 0.05 MG/ML IJ SOLN
25.0000 ug | INTRAMUSCULAR | Status: DC | PRN
  Administered 2011-04-14 (×4): 50 ug via INTRAVENOUS

## 2011-04-14 MED ORDER — SODIUM CHLORIDE 0.9 % IR SOLN
Status: DC | PRN
  Administered 2011-04-14: 15:00:00

## 2011-04-14 MED ORDER — ACETAMINOPHEN 650 MG RE SUPP: 650.0000 mg | Freq: Four times a day (QID) | RECTAL | Status: DC | PRN

## 2011-04-14 MED ORDER — FENTANYL CITRATE 0.05 MG/ML IJ SOLN
INTRAMUSCULAR | Status: AC
  Filled 2011-04-14: qty 2

## 2011-04-14 MED ORDER — MIDAZOLAM HCL 2 MG/2ML IJ SOLN: 1.0000 mg | INTRAMUSCULAR | Status: DC | PRN

## 2011-04-14 MED ORDER — VECURONIUM BROMIDE 10 MG IV SOLR
INTRAVENOUS | Status: DC | PRN
  Administered 2011-04-14: 3 mg via INTRAVENOUS
  Administered 2011-04-14: 1 mg via INTRAVENOUS
  Administered 2011-04-14: 3 mg via INTRAVENOUS

## 2011-04-14 SURGICAL SUPPLY — 94 items
ADH SKN CLS APL DERMABOND .7 (GAUZE/BANDAGES/DRESSINGS) ×1
ADH SKN CLS LQ APL DERMABOND (GAUZE/BANDAGES/DRESSINGS) ×1
APPLIER CLIP 9.375 MED OPEN (MISCELLANEOUS) ×2
APR CLP MED 9.3 20 MLT OPN (MISCELLANEOUS) ×1
ATCH SMKEVC FLXB CAUT HNDSWH (FILTER) ×1 IMPLANT
BAG DECANTER FOR FLEXI CONT (MISCELLANEOUS) ×2 IMPLANT
BINDER BREAST LRG (GAUZE/BANDAGES/DRESSINGS) IMPLANT
BINDER BREAST XLRG (GAUZE/BANDAGES/DRESSINGS) ×1 IMPLANT
BIOPATCH RED 1 DISK 7.0 (GAUZE/BANDAGES/DRESSINGS) ×3 IMPLANT
BLADE SURG 15 STRL LF DISP TIS (BLADE) IMPLANT
BLADE SURG 15 STRL SS (BLADE)
CANISTER SUCTION 2500CC (MISCELLANEOUS) ×3 IMPLANT
CHLORAPREP W/TINT 26ML (MISCELLANEOUS) ×4 IMPLANT
CLIP APPLIE 9.375 MED OPEN (MISCELLANEOUS) ×1 IMPLANT
CLOTH BEACON ORANGE TIMEOUT ST (SAFETY) ×4 IMPLANT
CONT SPEC 4OZ CLIKSEAL STRL BL (MISCELLANEOUS) ×2 IMPLANT
COVER PROBE W GEL 5X96 (DRAPES) ×2 IMPLANT
COVER SURGICAL LIGHT HANDLE (MISCELLANEOUS) ×4 IMPLANT
DERMABOND ADHESIVE PROPEN (GAUZE/BANDAGES/DRESSINGS) ×1
DERMABOND ADVANCED (GAUZE/BANDAGES/DRESSINGS) ×1
DERMABOND ADVANCED .7 DNX12 (GAUZE/BANDAGES/DRESSINGS) ×2 IMPLANT
DERMABOND ADVANCED .7 DNX6 (GAUZE/BANDAGES/DRESSINGS) ×1 IMPLANT
DRAIN CHANNEL 19F RND (DRAIN) ×6 IMPLANT
DRAPE CHEST BREAST 15X10 FENES (DRAPES) ×1 IMPLANT
DRAPE ORTHO SPLIT 77X108 STRL (DRAPES) ×4
DRAPE PROXIMA HALF (DRAPES) ×6 IMPLANT
DRAPE SURG 17X23 STRL (DRAPES) ×6 IMPLANT
DRAPE SURG ORHT 6 SPLT 77X108 (DRAPES) ×2 IMPLANT
DRAPE WARM FLUID 44X44 (DRAPE) ×2 IMPLANT
DRESSING TELFA 8X3 (GAUZE/BANDAGES/DRESSINGS) IMPLANT
DRSG PAD ABDOMINAL 8X10 ST (GAUZE/BANDAGES/DRESSINGS) ×6 IMPLANT
DRSG TEGADERM 4X4.75 (GAUZE/BANDAGES/DRESSINGS) ×1 IMPLANT
ELECT BLADE 4.0 EZ CLEAN MEGAD (MISCELLANEOUS) ×2
ELECT BLADE 6.5 EXT (BLADE) ×2 IMPLANT
ELECT CAUTERY BLADE 6.4 (BLADE) ×4 IMPLANT
ELECT REM PT RETURN 9FT ADLT (ELECTROSURGICAL) ×2
ELECTRODE BLDE 4.0 EZ CLN MEGD (MISCELLANEOUS) ×1 IMPLANT
ELECTRODE REM PT RTRN 9FT ADLT (ELECTROSURGICAL) ×2 IMPLANT
EVACUATOR SILICONE 100CC (DRAIN) ×6 IMPLANT
EVACUATOR SMOKE ACCUVAC VALLEY (FILTER) ×1
GAUZE XEROFORM 5X9 LF (GAUZE/BANDAGES/DRESSINGS) IMPLANT
GLOVE BIO SURGEON STRL SZ 6.5 (GLOVE) ×1 IMPLANT
GLOVE BIO SURGEON STRL SZ7 (GLOVE) ×2 IMPLANT
GLOVE BIO SURGEON STRL SZ7.5 (GLOVE) ×3 IMPLANT
GLOVE BIOGEL PI IND STRL 6.5 (GLOVE) IMPLANT
GLOVE BIOGEL PI IND STRL 7.0 (GLOVE) IMPLANT
GLOVE BIOGEL PI IND STRL 7.5 (GLOVE) ×1 IMPLANT
GLOVE BIOGEL PI IND STRL 8 (GLOVE) ×1 IMPLANT
GLOVE BIOGEL PI INDICATOR 6.5 (GLOVE) ×1
GLOVE BIOGEL PI INDICATOR 7.0 (GLOVE) ×2
GLOVE BIOGEL PI INDICATOR 7.5 (GLOVE) ×2
GLOVE BIOGEL PI INDICATOR 8 (GLOVE) ×1
GLOVE ECLIPSE 6.5 STRL STRAW (GLOVE) ×4 IMPLANT
GOWN PREVENTION PLUS XLARGE (GOWN DISPOSABLE) ×2 IMPLANT
GOWN STRL NON-REIN LRG LVL3 (GOWN DISPOSABLE) ×9 IMPLANT
GRAFT FLEX HD 6X16 THICK (Tissue Mesh) ×1 IMPLANT
KIT BASIN OR (CUSTOM PROCEDURE TRAY) ×2 IMPLANT
KIT ROOM TURNOVER OR (KITS) ×3 IMPLANT
NDL 18GX1X1/2 (RX/OR ONLY) (NEEDLE) ×1 IMPLANT
NDL HYPO 25GX1X1/2 BEV (NEEDLE) ×1 IMPLANT
NEEDLE 18GX1X1/2 (RX/OR ONLY) (NEEDLE) ×2 IMPLANT
NEEDLE 21 GA WING INFUSION (NEEDLE) ×2 IMPLANT
NEEDLE HYPO 25GX1X1/2 BEV (NEEDLE) ×4 IMPLANT
NS IRRIG 1000ML POUR BTL (IV SOLUTION) ×6 IMPLANT
PACK GENERAL/GYN (CUSTOM PROCEDURE TRAY) ×4 IMPLANT
PAD ARMBOARD 7.5X6 YLW CONV (MISCELLANEOUS) ×3 IMPLANT
PEN SKIN MARKING BROAD (MISCELLANEOUS) ×2 IMPLANT
PREFILTER EVAC NS 1 1/3-3/8IN (MISCELLANEOUS) ×2 IMPLANT
SET ASEPTIC TRANSFER (MISCELLANEOUS) ×1 IMPLANT
SPECIMEN JAR X LARGE (MISCELLANEOUS) ×2 IMPLANT
SPONGE GAUZE 4X4 12PLY (GAUZE/BANDAGES/DRESSINGS) ×1 IMPLANT
SPONGE LAP 18X18 X RAY DECT (DISPOSABLE) ×2 IMPLANT
STAPLER VISISTAT 35W (STAPLE) ×1 IMPLANT
STRIP CLOSURE SKIN 1/2X4 (GAUZE/BANDAGES/DRESSINGS) ×1 IMPLANT
SUT ETHILON 3 0 FSL (SUTURE) ×1 IMPLANT
SUT MNCRL AB 3-0 PS2 18 (SUTURE) ×6 IMPLANT
SUT MNCRL AB 4-0 PS2 18 (SUTURE) ×3 IMPLANT
SUT PDS AB 3-0 SH 27 (SUTURE) ×4 IMPLANT
SUT PROLENE 3 0 PS 2 (SUTURE) ×5 IMPLANT
SUT SILK 2 0 FS (SUTURE) ×3 IMPLANT
SUT VIC AB 2-0 BRD 54 (SUTURE) ×1 IMPLANT
SUT VIC AB 3-0 54X BRD REEL (SUTURE) IMPLANT
SUT VIC AB 3-0 BRD 54 (SUTURE) ×2
SUT VIC AB 3-0 SH 18 (SUTURE) ×4 IMPLANT
SYR BULB 3OZ (MISCELLANEOUS) IMPLANT
SYR BULB IRRIGATION 50ML (SYRINGE) ×3 IMPLANT
SYR CONTROL 10ML LL (SYRINGE) ×2 IMPLANT
TISSUE EXPANDER 800CC ×2 IMPLANT
TOWEL OR 17X24 6PK STRL BLUE (TOWEL DISPOSABLE) ×4 IMPLANT
TOWEL OR 17X26 10 PK STRL BLUE (TOWEL DISPOSABLE) ×4 IMPLANT
TOWEL OR NON WOVEN STRL DISP B (DISPOSABLE) ×2 IMPLANT
TRAY FOLEY CATH 14FRSI W/METER (CATHETERS) ×1 IMPLANT
TUBE CONNECTING 12X1/4 (SUCTIONS) ×3 IMPLANT
WATER STERILE IRR 1000ML POUR (IV SOLUTION) IMPLANT

## 2011-04-14 NOTE — Preoperative (Signed)
Beta Blockers   Reason not to administer Beta Blockers:Not Applicable 

## 2011-04-14 NOTE — OR Nursing (Signed)
Sentinel node #1 sent for touchprep to pathology at 14:19. Specimen requisition labeled incorrectly for frozen. Pathology called to verify receipt and told about the mistake at 14:21. Pathology called to acknowledge receiving the specimen at 14:29 and called with results at 14:37.

## 2011-04-14 NOTE — Anesthesia Preprocedure Evaluation (Addendum)
Anesthesia Evaluation  Patient identified by MRN, date of birth, ID band Patient awake    Reviewed: Allergy & Precautions, H&P , Patient's Chart, lab work & pertinent test results, reviewed documented beta blocker date and time   History of Anesthesia Complications (+) PONV  Airway Mallampati: II TM Distance: >3 FB Neck ROM: full    Dental No notable dental hx. (+) Edentulous Upper and Edentulous Lower   Pulmonary neg pulmonary ROS, shortness of breath,  breath sounds clear to auscultation  Pulmonary exam normal       Cardiovascular Exercise Tolerance: Good hypertension, Pt. on medications and Pt. on home beta blockers negative cardio ROS  + dysrhythmias (ECHO 9/11 normal LVF, EF 60%, valves OK) Atrial Fibrillation Rhythm:regular Rate:Normal     Neuro/Psych negative neurological ROS  negative psych ROS   GI/Hepatic negative GI ROS, Neg liver ROS,   Endo/Other  negative endocrine ROSMorbid obesity  Renal/GU negative Renal ROS     Musculoskeletal   Abdominal (+) + obese,   Peds  Hematology negative hematology ROS (+)   Anesthesia Other Findings PONV (postoperative nausea and vomiting)     BACK PAIN, UPPER 07/09/2008      DIVERTICULOSIS, COLON 12/26/2006   LIVER FUNCTION TESTS, ABNORMAL, HX OF 12/26/2007      Memory loss 05/16/2007   VERTIGO 09/07/2009      Cataract     Goiter   multi-nodular    Skin cancer     Hx of colonoscopy 2009      Wears glasses     Wears hearing aid   bilateral    Wears dentures     SOB (shortness of breath) on exertion        Complication of anesthesia     Atrial fibrillation 01/01/2007- s/p ablation   Vertigo   hx of;had Phenergan prn  OSTEOARTHRITIS 12/26/2006 takes Diclofenac daily but has stopped for surgery    Bruises easily     Diverticulitis    Reproductive/Obstetrics negative OB ROS                          Anesthesia Physical Anesthesia Plan  ASA:  II  Anesthesia Plan: General   Post-op Pain Management:    Induction: Intravenous  Airway Management Planned: Oral ETT  Additional Equipment:   Intra-op Plan:   Post-operative Plan: Extubation in OR  Informed Consent: I have reviewed the patients History and Physical, chart, labs and discussed the procedure including the risks, benefits and alternatives for the proposed anesthesia with the patient or authorized representative who has indicated his/her understanding and acceptance.   Dental Advisory Given  Plan Discussed with: CRNA, Surgeon and Anesthesiologist  Anesthesia Plan Comments: (Plan routine monitors, GETA)       Anesthesia Quick Evaluation

## 2011-04-14 NOTE — Op Note (Signed)
Prep diagnosis: Right breast cancer, stage 0 Postoperative diagnosis: Same as above Procedure: Right simple mastectomy Right axillary sentinel node biopsy Injection of blue dye for sentinel node identification  Surgeon: Dr. Harden Mo Estimated blood loss: 50 cc Specimens: #1 right breast marked short stitch superior, long stitch lateral #2 right axillary sentinel node with count of 7512  Complications: None Sponge and needle count was correct at the end of my portion of the procedure Disposition: The case was turned over to Dr. Odis Luster of plastic surgery for reconstruction  Indications: This is a 76 year old female who had a right breast abnormality and a routine screening mammogram. She had on mammogram and MRI a fairly extensive area of DCIS that measured about 9 cm in total. This extended from her nipple only up into her right upper outer quadrant. She also had another abnormality on MRI. The extensive nature of the DCIS of the 2 positive biopsies for DCIS I recommended a simple mastectomy with sentinel lymph node biopsy. She was seen in the multidisciplinary clinic in every one also agreed with this plan. She was also seen by plastic surgery and was due to undergo a reconstruction with the tissue expander at the same time.  Procedure: After informed consent was obtained the patient was taken to the operating room. She was first administered technetium in the standard periareolar fashion. She was administered 2 g of intravenous cefazolin. Sequential compression devices were placed on her legs prior to beginning. She was then placed under general anesthesia without complication. Her right breast and axilla were then prepped and draped in the standard sterile surgical fashion. Surgical timeout was performed.  I injected a cc of methylene blue and saline in all 4 of the cardinal positions around her areola. This was then massaged for 1 minute. I then made an elliptical incision encompassing  the nipple areolar complex. I carried this incision farther out laterally due to the extensive nature of the DCIS in that region. Flaps were then made to the clavicle superiorly, sternum medially, inframammary crease inferiorly and the latissimus laterally. The breast tissue was then removed from the pectoralis muscle to include the pectoralis fascia. The lateral portions of this were ligated with suture. There were several small areas that were bleeding the pectoralis muscle I oversewed with 3-0 Vicryl. I marked the breast short stitch superior and long stitch lateral. I then identified a single hot and blue sentinel lymph node and this was excised. This was confirmed by pathology to be negative. I then obtained hemostasis. Irrigation was performed. I then turned the case over to Dr. Odis Luster of plastic surgery for reconstruction.

## 2011-04-14 NOTE — Anesthesia Postprocedure Evaluation (Signed)
  Anesthesia Post-op Note  Patient: Selena [name]  Procedure(s) Performed: Procedure(s) (LRB): SIMPLE MASTECTOMY WITH AXILLARY SENTINEL NODE BIOPSY (Right) BREAST RECONSTRUCTION (Right)  Patient Location: PACU  Anesthesia Type: General  Level of Consciousness: sedated  Airway and Oxygen Therapy: Patient Spontanous Breathing and Patient connected to nasal cannula oxygen  Post-op Pain: mild  Post-op Assessment: Post-op Vital signs reviewed, Patient's Cardiovascular Status Stable, Respiratory Function Stable, Patent Airway, No signs of Nausea or vomiting and Pain level controlled  Post-op Vital Signs: Reviewed and stable  Complications: No apparent anesthesia complications

## 2011-04-14 NOTE — Brief Op Note (Signed)
04/14/2011  5:02 PM  PATIENT:  Selena [name]  76 y.o. female  PRE-OPERATIVE DIAGNOSIS:  right breast cancer  POST-OPERATIVE DIAGNOSIS:  Right Breast Cancer  PROCEDURE:  Procedure(s) (LRB): SIMPLE MASTECTOMY WITH AXILLARY SENTINEL NODE BIOPSY (Right) BREAST RECONSTRUCTION (Right) with acellular dermal matrix and tissue expander  SURGEON:  Surgeon(s) and Role: Panel 1:    * Emelia Loron, MD - Primary  Panel 2:    * Etter Sjogren, MD - Primary  PHYSICIAN ASSISTANT:   ASSISTANTS: none   ANESTHESIA:   general  EBL:  Total I/O In: 3000 [I.V.:3000] Out: 600 [Urine:350; Blood:250]  BLOOD ADMINISTERED:none  DRAINS: (3) Jackson-Pratt drain(s) with closed bulb suction in the mastectomy site (2) and submuscular (1)   LOCAL MEDICATIONS USED:  NONE  SPECIMEN:  No Specimen  DISPOSITION OF SPECIMEN:  N/A  COUNTS:  YES  TOURNIQUET:  * No tourniquets in log *  DICTATION: .Other Dictation: Dictation Number [id number]  PLAN OF CARE: Admit for overnight observation  PATIENT DISPOSITION:  PACU - hemodynamically stable.   Delay start of Pharmacological VTE agent (>24hrs) due to surgical blood loss or risk of bleeding: yes

## 2011-04-14 NOTE — H&P (View-Only) (Signed)
Patient ID: Selena [name], female   DOB: 01/14/1936, 75 y.o.   MRN: [id number]  Chief Complaint  Patient presents with  . Other    Breast cancer    HPI Selena [name] is a 75 y.o. female.  Referred by Dr. Elizabeth Eagle HPI This is a healthy active 75-year-old female underwent a routine screening mammogram in February 2013 which showed suspicious microcalcifications in the right breast. They extend from the subareolar region posteriorly measuring approximate 9 x 6 cm. She underwent stereotactic guided biopsy of the calcifications at the anterior and posterior extent of these areas with clip placement in both of them. She is also since undergone an MRI which shows diffuse clumped linear enhancement extending from the subareolar portion of the right breast posteriorly for about 9.5 cm. She also has a 1.2 cm irregular enhancing mass located within the central superior right breast at the 12:00 position that is worrisome for invasive cancer. There are no left breast abnormalities and there are no axillary or internal mammary adenopathy. Her pathology of both lesions shows ductal carcinoma in situ with associated calcifications. These are both estrogen and progesterone receptor positive. They're intermediate grade. She reports no complaints referable to her breasts and has no history of any breast complaints either. She comes in today to discuss her options for her newly diagnosed breast cancer.  Past Medical History  Diagnosis Date  . Atrial fibrillation 01/01/2007  . BACK PAIN, UPPER 07/09/2008  . DIVERTICULOSIS, COLON 12/26/2006  . LIVER FUNCTION TESTS, ABNORMAL, HX OF 12/26/2007  . Memory loss 05/16/2007  . OSTEOARTHRITIS 12/26/2006  . VERTIGO 09/07/2009  . Cataract   . Hypertension   . Goiter     multi-nodular  . Skin cancer   . Hx of colonoscopy 2009  . Wears glasses   . Wears hearing aid   . Wears dentures   . SOB (shortness of breath) on exertion     Past Surgical History  Procedure  Date  . Abdominal hysterectomy   . Rotator cuff repair     right  . Knee surgery     arthroscopic/ bilateral knee replacements  . Cataract extraction w/ intraocular lens  implant, bilateral   . Colonoscopy   . Appendectomy   . Ablation of dysrhythmic focus 2009    Family History  Problem Relation Age of Onset  . Colon cancer Mother   . Colon cancer Maternal Aunt   . Cancer Maternal Uncle   . Prostate cancer Maternal Uncle   . Prostate cancer Maternal Uncle   . Other Maternal Uncle     Social History History  Substance Use Topics  . Smoking status: Former Smoker    Quit date: 01/25/1968  . Smokeless tobacco: Never Used  . Alcohol Use: No    Allergies  Allergen Reactions  . Codeine   . Codeine Phosphate     Current Outpatient Prescriptions  Medication Sig Dispense Refill  . aspirin 81 MG tablet Take 81 mg by mouth daily.        . calcium carbonate (TUMS EX) 750 MG chewable tablet Chew 1 tablet by mouth daily.      . chlorhexidine (PERIDEX) 0.12 % solution Take 1 oz by mouth Daily.      . diclofenac (VOLTAREN) 75 MG EC tablet Take 1 tablet (75 mg total) by mouth 2 (two) times daily.  180 tablet  6  . peg 3350 powder (MOVIPREP) 100 G SOLR MOVI Prep take as directed  1 kit    0  . promethazine (PHENERGAN) 50 MG tablet Take 1 tablet (50 mg total) by mouth every 6 (six) hours as needed for nausea.  20 tablet  4  . promethazine-dextromethorphan (PROMETHAZINE-DM) 6.25-15 MG/5ML syrup Take 5 mLs by mouth 4 (four) times daily as needed.  120 mL  0   Current Facility-Administered Medications  Medication Dose Route Frequency Provider Last Rate Last Dose  . 0.9 %  sodium chloride infusion  500 mL Intravenous Continuous Robert D Kaplan, MD        Review of Systems Review of Systems  Constitutional: Negative for fever, chills and unexpected weight change.  HENT: Negative for hearing loss, congestion, sore throat, trouble swallowing and voice change.   Eyes: Positive for visual  disturbance.  Respiratory: Positive for shortness of breath (when walking up stairs). Negative for cough and wheezing.   Cardiovascular: Negative for chest pain, palpitations and leg swelling.  Gastrointestinal: Negative for nausea, vomiting, abdominal pain, diarrhea, constipation, blood in stool, abdominal distention and anal bleeding.  Genitourinary: Negative for hematuria, vaginal bleeding and difficulty urinating.  Musculoskeletal: Negative for arthralgias.  Skin: Negative for rash and wound.  Neurological: Negative for seizures, syncope and headaches.  Hematological: Negative for adenopathy. Bruises/bleeds easily.  Psychiatric/Behavioral: Negative for confusion.    There were no vitals taken for this visit.  Physical Exam Physical Exam  Vitals reviewed. Constitutional: She appears well-developed and well-nourished.  Eyes: Conjunctivae are normal. No scleral icterus.  Neck: Neck supple.  Cardiovascular: Normal rate, regular rhythm and normal heart sounds.   Pulmonary/Chest: Effort normal and breath sounds normal. She has no wheezes. She has no rales. Right breast exhibits no inverted nipple, no mass, no nipple discharge, no skin change and no tenderness. Left breast exhibits no inverted nipple, no mass, no nipple discharge, no skin change and no tenderness. Breasts are symmetrical.  Abdominal: Soft. There is no hepatomegaly.  Lymphadenopathy:    She has no cervical adenopathy.    She has no axillary adenopathy.       Right: No supraclavicular adenopathy present.       Left: No supraclavicular adenopathy present.    Data Reviewed BILATERAL BREAST MRI WITH AND WITHOUT CONTRAST  Technique: Multiplanar, multisequence MR images of both breasts  were obtained prior to and following the intravenous administration  of 19ml of Multihance. Three dimensional images were evaluated at  the independent DynaCad workstation.  Comparison: 03/17/2011, 03/08/2011, 02/04/2010, 05/26/2009,    11/24/2008, 11/17/2008, 11/07/2007 mammograms.  Findings: Non mass clumped linear enhancement is seen extending  from the subareolar portion of the right breast posteriorly into  the posterior third of the right breast. This corresponds to the  distribution of the numerous microcalcifications within the right  breast. There is also enhancement posteriorly within the right  breast which in part may be secondary to post biopsy change. The  anterior- posterior extent of the enhancement measures 9.5 cm. The  distance between the clips placed during the stereotactic biopsies  measures 7.8 cm. In addition, there is an enhancing mass located  centrally and superiorly within the middle third of the right  breast at approximately the 12 o'clock position which measures 1.2  x 1.0 x 1.0 cm in size and is associated with a washout enhancement  curve worrisome for an invasive carcinoma. There are no foci of  worrisome enhancement within the left breast. There is no evidence  for axillary or internal mammary adenopathy and there are no  additional findings.  IMPRESSION:  1.   Diffuse clumped linear enhancement consistent with DCIS  extending from the subareolar portion of the right breast  posteriorly for approximately 9.5 cm. This corresponds to the  recently biopsied diffuse pleomorphic calcifications present within  the right breast.  2. 1.2 cm irregular enhancing mass located within the central  superior right breast at the 12 o'clock position worrisome for  invasive carcinoma.  3. No additional findings.   Assessment    Right breast DCIS    Plan    I recommended right simple mastectomy, right axillary sentinel node biopsy and possible immediate reconstruction.  She will see Dr. Bowers Friday for evaluation for reconstruction then will schedule.   We discussed the staging and pathophysiology of breast cancer. We discussed all of the different options for treatment for breast cancer  including surgery, chemotherapy, radiation therapy, Herceptin, and antiestrogen therapy.   We discussed a sentinel lymph node biopsy as she does not appear to having lymph node involvement right now. We discussed the performance of that with injection of radioactive tracer and blue dye. We discussed intraoperative pathology and proceeding with axillary dissection if she has nodal involvement.  She only has DCIS right now but due to size and other lesion in right breast will plan on proceeding this way.  We discussed about a 1-2% risk lifetime of chronic shoulder pain as well as lymphedema associated with a sentinel lymph node biopsy.  We discussed a higher risk with axillary dissection. We discussed the options for treatment of the breast cancer which included lumpectomy versus a mastectomy. We discussed the performance of the lumpectomy. We discussed the mastectomy and the postoperative care for that as well. We discussed that there is no difference in her survival whether she undergoes lumpectomy with radiation therapy or antiestrogen therapy versus a mastectomy. There is a slight difference in the local recurrence rate being 3-5% with lumpectomy and about 1% with a mastectomy. I do not think with the two areas of DCIS, the third unbiopsied area that anything but a mastectomy is an option.  I reviewed her films with her as well as the distance between the known cancers.  She is agreeable to proceeding after evaluation for reconstruction.  She is very healthy active woman and I think this would be reasonable. We discussed the risks of operation including bleeding, infection, possible reoperation. She understands her further therapy will be based on what her stages at the time of her operation.         Dewitt Judice 03/23/2011, 4:38 PM    

## 2011-04-14 NOTE — Transfer of Care (Signed)
Immediate Anesthesia Transfer of Care Note  Patient: Selena [name]  Procedure(s) Performed: Procedure(s) (LRB): SIMPLE MASTECTOMY WITH AXILLARY SENTINEL NODE BIOPSY (Right) BREAST RECONSTRUCTION (Right)  Patient Location: PACU  Anesthesia Type: General  Level of Consciousness: awake and alert   Airway & Oxygen Therapy: Patient Spontanous Breathing and Patient connected to face mask oxygen  Post-op Assessment: Report given to PACU RN  Post vital signs: Reviewed and stable  Complications: No apparent anesthesia complications

## 2011-04-14 NOTE — Interval H&P Note (Signed)
History and Physical Interval Note:  04/14/2011 12:16 PM  Selena [name]  has presented today for surgery, with the diagnosis of right breast cancer  The various methods of treatment have been discussed with the patient and family. After consideration of risks, benefits and other options for treatment, the patient has consented to  Procedure(s) (LRB): SIMPLE MASTECTOMY WITH AXILLARY SENTINEL NODE BIOPSY (Right) BREAST RECONSTRUCTION (Right) as a surgical intervention .  The patients' history has been reviewed, patient examined, no change in status, stable for surgery.  I have reviewed the patients' chart and labs.  Questions were answered to the patient's satisfaction.     Charlane Westry

## 2011-04-15 ENCOUNTER — Encounter (HOSPITAL_COMMUNITY): Payer: Self-pay | Admitting: General Surgery

## 2011-04-15 LAB — CBC
Hemoglobin: 12.5 g/dL (ref 12.0–15.0)
MCHC: 34 g/dL (ref 30.0–36.0)
MCV: 93.6 fL (ref 78.0–100.0)
Platelets: 169 10*3/uL (ref 150–400)
RDW: 12.8 % (ref 11.5–15.5)
WBC: 20.3 10*3/uL — ABNORMAL HIGH (ref 4.0–10.5)

## 2011-04-15 NOTE — Progress Notes (Signed)
UR of chart complete.  

## 2011-04-15 NOTE — Progress Notes (Signed)
Subjective: Good pain control. Tolerating diet. Ambulating.  Objective: Vital signs in last 24 hours: Temp:  [97 F (36.1 C)-99 F (37.2 C)] 98 F (36.7 C) (03/22 1415) Pulse Rate:  [75-97] 84  (03/22 1415) Resp:  [11-18] 17  (03/22 1415) BP: (122-155)/(50-83) 127/55 mmHg (03/22 1415) SpO2:  [92 %-100 %] 96 % (03/22 1415)  Intake/Output from previous day: 03/21 0701 - 03/22 0700 In: 4519 [P.O.:600; I.V.:3919] Out: 2005 [Urine:1225; Drains:530; Blood:250] Intake/Output this shift: Total I/O In: 1281 [P.O.:600; I.V.:681] Out: 675 [Urine:450; Drains:225]  Operative sites: Mastectomy flaps viable. No evidence of vascular compromise. Tissue expander in good position. Drains functioning. Drainage a little bit bloody in drain #3. Out put is a little bit elevated in #3. 205 cc last 12 hours. There is no evidence of hematoma. This drain is located in the area of the ADM. That may explain the increased output.Selena [name] 04/15/11 0845  WBC 20.3*  HGB 12.5  HCT 36.8  NA --  K --  CL --  CO2 --  BUN --  CREATININE --  GLU --    Studies/Results: Nm Sentinel Node Inj-no Rpt (breast)  04/14/2011  CLINICAL DATA: RIght mastectomy for cancer   Sulfur colloid was injected intradermally by the nuclear medicine  technologist for breast cancer sentinel node localization.      Assessment/Plan: Continue observation. Continue antibiotic.  LOS: 1 day    Selena [name] M 04/15/2011 3:17 PM

## 2011-04-15 NOTE — Progress Notes (Signed)
1 Day Post-Op  Subjective: No complaints this am  Objective: Vital signs in last 24 hours: Temp:  [97 F (36.1 C)-99 F (37.2 C)] 98.4 F (36.9 C) (03/22 0646) Pulse Rate:  [73-97] 85  (03/22 0646) Resp:  [11-18] 16  (03/22 0646) BP: (122-166)/(50-86) 138/50 mmHg (03/22 0646) SpO2:  [95 %-100 %] 98 % (03/22 0646)    Intake/Output from previous day: 03/21 0701 - 03/22 0700 In: 4519 [P.O.:600; I.V.:3919] Out: 2005 [Urine:1225; Drains:530; Blood:250] Intake/Output this shift: Total I/O In: 1519 [P.O.:600; I.V.:919] Out: 990 [Urine:650; Drains:340]  General appearance: no distress Chest wall: no tenderness, mild ecchymosis, flaps viable, drains with serosang fluid, #3 with more bloody fluid   Assessment/Plan: POD #1 right sm/snbx, expander reconstruction  1. Pain well controlled 2. Will check labs and follow drain #3 3. Dc foley 4. Regular diet 5. scds 6. Home when ok with Dr Odis Luster and drain output reasonable    LOS: 1 day    [facility] 04/15/2011

## 2011-04-15 NOTE — Op Note (Signed)
NAMECHRIS, NARASIMHAN                ACCOUNT NO.:  [id number]  MEDICAL RECORD NO.:  [id number]  LOCATION:  5155                         FACILITY:  [facility]  PHYSICIAN:  Etter Sjogren, M.D.     DATE OF BIRTH:  August 20, 1935  DATE OF PROCEDURE:  04/14/2011 DATE OF DISCHARGE:                              OPERATIVE REPORT   PREOPERATIVE DIAGNOSIS:  Right breast cancer.  POSTOPERATIVE DIAGNOSIS:  Right breast cancer.  PROCEDURES PERFORMED: 1. Right breast reconstruction with tissue expander. 2. Chest wall reconstruction with Flex HD (acellular dermal matrix).  SURGEON:  Etter Sjogren, MD  ANESTHESIA:  General.  ESTIMATED BLOOD LOSS:  30 mL.  DRAINS:  Three 19-French drains.  CLINICAL NOTE:  A 76 year old woman with right breast cancer, desires reconstruction, option was discussed.  She selected tissue expander followed by stage procedure by placement of implant.  She understood the possibility of the use of an acellular dermal matrix and also risks and possible complications including, but not limited to bleeding, infection, anesthesia related complications, healing problems, scarring, loss of sensation, fluid accumulations, failure of device, capsular contracture, displacement of device, wrinkles, ripples, pneumothorax, pulmonary embolism, asymmetry, disappointment, secondary procedures and she understood all of these and wished to proceed.  DESCRIPTION OF PROCEDURE:  The General Surgery mastectomy had been completed.  The skin flaps looked very healthy.  No evidence of any vascular compromise.  The wound was irrigated thoroughly with saline. The submuscular space was developed.  There was absence of pectoralis origin at the inferomedial aspect and it was felt that would definitely necessitate the use of Flex HD acellular dermal matrix.  Submuscular space was developed, a little bit of the serratus anterior was lifted out laterally and great care was taken to avoid damage to  underlying chest cavity.  Thorough irrigation with saline as well as antibiotic solution and the expander was prepared after thoroughly cleaning gloves. The air was removed from the expander, 100 mL of sterile saline was placed using a closed filling system.  It was soaked in antibiotic solution for greater than 5 minutes.  The expander was then positioned. The Flex HD also was soaked in saline for greater than 10 minutes and then was soaked in antibiotic solution for greater than 5 minutes.  It was positioned at the inferomedial corner with the dermal side facing up towards the overlying mastectomy flaps and the epidermal side down towards the tissue expander.  The expander was again inspected and care was taken to make sure that there were no wrinkles that was lying flat in the submuscular space.  The Flex HD was secured around the periphery using 3-0 PDS simple running suture.  Great care was taken to avoid damage to underlying expander, which was again covered with antibiotic solution again as the Flex HD was sutured in position.  The expander was kept under direct vision at all times.  Overall, the Flex HD was sutured.  Again, the mastectomy space was then irrigated thoroughly.  A 19-French drain was positioned in the submuscular space prior to placing the expander and it was brought out through the separate stab wound inferolaterally and secured with 3-0 Prolene suture.  Having irrigated  the mastectomy space, meticulous hemostasis with electrocautery. Excellent hemostasis had been ensured.  Two more 19-French drains were positioned, one underneath the superior mastectomy flap and the other one underneath the inferior mastectomy flap.  Both of these were brought out inferolateral and secured with 3-0 Prolene sutures.  The skin closure with 3-0 Monocryl interrupted inverted deep dermal sutures and Dermabond.  The drain dressings, Biopatch, Tegaderm, and ABDs were then placed over the  wound and the breast vest was positioned and she was transferred to the recovery room in stable having tolerated the procedure well.     Etter Sjogren, M.D.     DB/MEDQ  D:  04/14/2011  T:  04/15/2011  Job:  [id number]

## 2011-04-16 NOTE — Progress Notes (Signed)
2 Days Post-Op  Subjective: Feels OK and good pain control. Tolerated diet no nausea  Objective: Vital signs in last 24 hours: Temp:  [97.6 F (36.4 C)-98.8 F (37.1 C)] 98.2 F (36.8 C) (03/23 0724) Pulse Rate:  [84-95] 95  (03/23 0724) Resp:  [17-19] 18  (03/23 0724) BP: (127-150)/(55-72) 137/68 mmHg (03/23 0724) SpO2:  [92 %-98 %] 92 % (03/23 0724) Weight:  [205 lb 7.5 oz (93.2 kg)] 205 lb 7.5 oz (93.2 kg) (03/22 2022)   Intake/Output from previous day: 03/22 0701 - 03/23 0700 In: 2556 [P.O.:840; I.V.:1716] Out: 855 [Urine:450; Drains:405] Intake/Output this shift: Total I/O In: 120 [P.O.:120] Out: 60 [Drains:60]   General appearance: alert and no distress  Incision: JP's seem to be slowing. #3 still the more red than the others, but looks thin now, so don't believe active bleeding. Did not remove dressings as Dr Odis Luster will be in later  Lab Results:   [facility] 04/15/11 0845  WBC 20.3*  HGB 12.5  HCT 36.8  PLT 169   BMET No results found for this basename: NA:2,K:2,CL:2,CO2:2,GLUCOSE:2,BUN:2,CREATININE:2,CALCIUM:2 in the last 72 hours PT/INR No results found for this basename: LABPROT:2,INR:2 in the last 72 hours ABG No results found for this basename: PHART:2,PCO2:2,PO2:2,HCO3:2 in the last 72 hours  MEDS, Scheduled    . antiseptic oral rinse  15 mL Mouth Rinse BID  .  ceFAZolin (ANCEF) IV  1 g Intravenous Q8H  . docusate sodium  100 mg Oral BID  . methocarbamol  500 mg Oral QID    Studies/Results: Nm Sentinel Node Inj-no Rpt (breast)  04/14/2011  CLINICAL DATA: RIght mastectomy for cancer   Sulfur colloid was injected intradermally by the nuclear medicine  technologist for breast cancer sentinel node localization.      Assessment: s/p Procedure(s): SIMPLE MASTECTOMY WITH AXILLARY SENTINEL NODE BIOPSY BREAST RECONSTRUCTION Stable post op, doing well  Plan: Per Dr Odis Luster   LOS: 2 days     Currie Paris, MD, [facility]  Surgery, [place] [phone]   04/16/2011 9:29 AM

## 2011-04-16 NOTE — Progress Notes (Signed)
Subjective: Pain well controlled. Tolerating diet.  Objective: Vital signs in last 24 hours: Temp:  [97.6 F (36.4 C)-98.8 F (37.1 C)] 98.2 F (36.8 C) (03/23 0724) Pulse Rate:  [84-95] 95  (03/23 0724) Resp:  [17-19] 18  (03/23 0724) BP: (127-150)/(55-69) 137/68 mmHg (03/23 0724) SpO2:  [92 %-98 %] 92 % (03/23 0724) Weight:  [205 lb 7.5 oz (93.2 kg)] 205 lb 7.5 oz (93.2 kg) (03/22 2022)  Intake/Output from previous day: 03/22 0701 - 03/23 0700 In: 2556 [P.O.:840; I.V.:1716] Out: 855 [Urine:450; Drains:405] Intake/Output this shift: Total I/O In: 360 [P.O.:360] Out: 60 [Drains:60]  Operative sites: Mastectomy flaps look very healthy throughout. Chest is soft and flat. No evidence of accumulating hematoma Tissue expander is in good position. Drains functioning. Drainage is thinner than yesterday. It has also slowed. 60 cc s from all drains combined overnight. She has had 40 cc from #3 over last 4 hours with ambulation.   Basename 04/15/11 0845  WBC 20.3*  HGB 12.5  HCT 36.8  NA --  K --  CL --  CO2 --  BUN --  CREATININE --  GLU --    Studies/Results: Nm Sentinel Node Inj-no Rpt (breast)  04/14/2011  CLINICAL DATA: RIght mastectomy for cancer   Sulfur colloid was injected intradermally by the nuclear medicine  technologist for breast cancer sentinel node localization.      Assessment/Plan: Continue to observe drain #3. This does not appear to be a frank hemorrhagic process by any means.  LOS: 2 days    Jakaria Lavergne M 04/16/2011 11:14 AM

## 2011-04-17 MED ORDER — HYDROCODONE-ACETAMINOPHEN 5-325 MG PO TABS: 1.0000 | ORAL_TABLET | ORAL | Status: AC | PRN

## 2011-04-17 MED ORDER — CEPHALEXIN 500 MG PO CAPS
500.0000 mg | ORAL_CAPSULE | Freq: Two times a day (BID) | ORAL | Status: DC
  Filled 2011-04-17 (×2): qty 1

## 2011-04-17 MED ORDER — DSS 100 MG PO CAPS: 100.0000 mg | ORAL_CAPSULE | Freq: Two times a day (BID) | ORAL | Status: AC

## 2011-04-17 MED ORDER — METHOCARBAMOL 500 MG PO TABS: 500.0000 mg | ORAL_TABLET | Freq: Four times a day (QID) | ORAL | Status: AC

## 2011-04-17 MED ORDER — CEPHALEXIN 500 MG PO CAPS: 500.0000 mg | ORAL_CAPSULE | Freq: Two times a day (BID) | ORAL | Status: AC

## 2011-04-17 NOTE — Progress Notes (Signed)
3 Days Post-Op  Subjective: Feels well, ambulating, tol diet  Objective: Vital signs in last 24 hours: Temp:  [98.3 F (36.8 C)-98.8 F (37.1 C)] 98.8 F (37.1 C) (03/24 0507) Pulse Rate:  [89-95] 89  (03/24 0507) Resp:  [16-17] 17  (03/24 0507) BP: (126-139)/(57-59) 126/57 mmHg (03/24 0507) SpO2:  [91 %-95 %] 91 % (03/24 0507)    Intake/Output from previous day: 03/23 0701 - 03/24 0700 In: 1937.5 [P.O.:720; I.V.:1217.5] Out: 380 [Drains:380] Intake/Output this shift:    General appearance: no distress Incision/Wound:drains with serosang fluid, #3 more watery today, flaps viable with some ecchymosis but no hematoma  Assessment/Plan: POD #3 right sm/snbx with expander reconstruction -Dr. Odis Luster to see today, appears ready to dc -I will call her with path and follow up this week   LOS: 3 days    [facility] 04/17/2011

## 2011-04-17 NOTE — Discharge Summary (Signed)
Physician Discharge Summary  Patient ID: Selena [name] MRN: [id number] DOB/AGE: 74/74/1937 74 y.o.  Admit date: 04/14/2011 Discharge date: 04/17/2011  Admission Diagnoses:Right breast cancer  Discharge Diagnoses: Same Active Problems:  * No active hospital problems. *    Discharged Condition: good  Hospital Course: On the day of admission the patient was taken to surgery and had right mastectomy with sentinel node and reconstruction with Flex HD and tissue expander. The patient tolerated the procedures well. Postoperatively, the mastectomy flaps maintained excellent color and capillary refill. The patient was ambulatory and tolerating diet on the first postoperative day. She had some bloody drainage from drain #3 but Hct stable. This drainage stabilized and never showed evidence of frank hemorrhage. Chest remained soft. No evidence of hematoma. Drainage probably related to drain located close to the ADM. Pain control excellent. Drainage thin. Drains functioning. Ready for discharge..  Significant Diagnostic Studies: labs: Hct post-op about 36.  Treatments: antibiotics: Ancef, anticoagulation: none and surgery: right mastectomy and reconstruction with ADM and tissue expander.  Discharge Exam: Blood pressure 126/57, pulse 89, temperature 98.8 F (37.1 C), temperature source Oral, resp. rate 17, height 5\' 7"  (1.702 m), weight 205 lb 7.5 oz (93.2 kg), SpO2 91.00%.  Operative sites: Mastectomy flaps viable. Excellent color.Tissue expander good position. Drains functioning. Drainage thin and appropriate in amount.  Disposition: Discharge. See in office in 3 days.  Discharge Orders    Future Appointments: Provider: Department: Dept Phone: Center:   05/02/2011 1:15 PM Sherrie Mustache [facility] [phone] None   05/02/2011 1:30 PM Victorino December, MD [facility] [phone] None     Medication List  As of 04/17/2011 11:08 AM   STOP taking these medications         aspirin 81 MG  tablet      diclofenac 75 MG EC tablet         TAKE these medications         cephALEXin 500 MG capsule   Commonly known as: KEFLEX   Take 1 capsule (500 mg total) by mouth every 12 (twelve) hours.      DSS 100 MG Caps   Take 100 mg by mouth 2 (two) times daily.      HYDROcodone-acetaminophen 5-325 MG per tablet   Commonly known as: NORCO   Take 1-2 tablets by mouth every 4 (four) hours as needed.      methocarbamol 500 MG tablet   Commonly known as: ROBAXIN   Take 1 tablet (500 mg total) by mouth 4 (four) times daily.           Follow-up Information    Follow up with [facility], [facility] in 3 weeks.   Contact information:   [facility] [address] [place] [address] [phone]          Signed: Rossie Muskrat 04/17/2011, 11:08 AM

## 2011-04-17 NOTE — Discharge Instructions (Signed)
[facility], [place] [phone]  MASTECTOMY: POST OP INSTRUCTIONS  Always review your discharge instruction sheet given to you by the facility where your surgery was performed. IF YOU HAVE DISABILITY OR FAMILY LEAVE FORMS, YOU MUST BRING THEM TO THE OFFICE FOR PROCESSING.   DO NOT GIVE THEM TO YOUR DOCTOR. A prescription for pain medication may be given to you upon discharge.  Take your pain medication as prescribed, if needed.  If narcotic pain medicine is not needed, then you may take acetaminophen (Tylenol) as needed. 1. Take your usually prescribed medications unless otherwise directed. 2. If you need a refill on your pain medication, please contact your pharmacy.  They will contact our office to request authorization.  Prescriptions will not be filled after 5pm or on week-ends. 3. You should follow a light diet the first few days after arrival home, such as soup and crackers, etc.  Resume your normal diet the day after surgery. 4. Most patients will experience some swelling and bruising on the chest and underarm.  Ice packs will help.  Swelling and bruising can take several days to resolve. Wear the binder day and night until you return to the office.  5. It is common to experience some constipation if taking pain medication after surgery.  Increasing fluid intake and taking a stool softener (such as Colace) will usually help or prevent this problem from occurring.  A mild laxative (Milk of Magnesia or Miralax) should be taken according to package instructions if there are no bowel movements after 48 hours. 6. Follow Dr. Odis Luster instructions for the dressings.  You may take a limited sponge bath.  No tub baths or showers until the drains are removed.  You may have steri-strips (small skin tapes) in place directly over the incision.  These strips should be left on the skin for 7-10 days. If you have glue it will come off in next couple week.  Any sutures will be removed at an office  visit 7. DRAINS:  If you have drains in place, it is important to keep a list of the amount of drainage produced each day in your drains.  Before leaving the hospital, you should be instructed on drain care.  Call our office if you have any questions about your drains. I will remove your drains when they put out less than 30 cc or ml for 2 consecutive days. 8. ACTIVITIES:  You may resume regular (light) daily activities beginning the next day--such as daily self-care, walking, climbing stairs--gradually increasing activities as tolerated.  You may have sexual intercourse when it is comfortable.  Refrain from any heavy lifting or straining until approved by your doctor. a. You may drive when you are no longer taking prescription pain medication, you can comfortably wear a seatbelt, and you can safely maneuver your car and apply brakes. b. RETURN TO WORK:  __________________________________________________________ 9. You should see your doctor in the office for a follow-up appointment approximately 3-5 days after your surgery.  Your doctor's nurse will typically make your follow-up appointment when she calls you with your pathology report.  Expect your pathology report 3-4business days after surgery. 10. OTHER INSTRUCTIONS: ______________________________________________________________________________________________ ____________________________________________________________________________________________ WHEN TO CALL : 1. Fever over 101.0 2. Nausea and/or vomiting 3. Extreme swelling or bruising 4. Continued bleeding from incision. 5. Increased pain, redness, or drainage from the incision. The clinic staff is available to answer your questions during regular business hours.  Please don't hesitate to call and ask to speak to  one of the nurses for clinical concerns.  If you have a medical emergency, go to the nearest emergency room or call 911.  A surgeon from [facility] is always on call  at the hospital. [address], [address], [place], [place]  [address] ? [address] [address], [place], [place]   [address] [phone] ? [phone] ? FAX [phone] Web site: [email or web address]  See Dr. Odis Luster on March 27 at office No lifting for 6 weeks No vigorous activity for 6 weeks (including outdoor walks) No driving for 4 weeks OK to walk up stairs slowly Stay propped up Use incentive spirometer at home every hour while awake No shower while drains are in place Empty drains at least three times a day and record the amounts separately Change drain dressings every third day if instructed to do so by Dr. Odis Luster (Begin Saturday March 30)  Apply Bacitracin antibiotic ointment to the drain sites  Place gauze dressing over drains  Secure the gauze with tape Take an over-the-counter Probiotic while on antibiotics Take an over-the-counter stool softener (such as Colace) while on pain medication

## 2011-05-02 ENCOUNTER — Other Ambulatory Visit: Payer: Medicare Other | Admitting: Lab

## 2011-05-02 ENCOUNTER — Ambulatory Visit: Payer: Medicare Other | Admitting: Oncology

## 2011-05-05 ENCOUNTER — Ambulatory Visit: Payer: Medicare Other | Admitting: Oncology

## 2011-05-06 ENCOUNTER — Ambulatory Visit (INDEPENDENT_AMBULATORY_CARE_PROVIDER_SITE_OTHER): Payer: Medicare Other | Admitting: General Surgery

## 2011-05-06 ENCOUNTER — Encounter (INDEPENDENT_AMBULATORY_CARE_PROVIDER_SITE_OTHER): Payer: Self-pay | Admitting: General Surgery

## 2011-05-06 VITALS — BP 140/71 | HR 79 | Temp 98.2°F | Ht 67.0 in | Wt 202.6 lb

## 2011-05-06 DIAGNOSIS — Z09 Encounter for follow-up examination after completed treatment for conditions other than malignant neoplasm: Secondary | ICD-10-CM

## 2011-05-06 NOTE — Progress Notes (Signed)
Subjective:     Patient ID: Selena [name], female   DOB: 10-08-1935, 76 y.o.   MRN: [id number]  HPI This is a 76 year old female seen in the multi-disciplinary clinic with a newly diagnosed right breast DCIS. She had a fairly extensive area of DCIS. We elected to do a right simple mastectomy with a sentinel node biopsy and she had an immediate reconstruction with a tissue expander at the same time. This went well and she is recovering. She still has one of her drains in place. Her final pathology showed 2 microscopic foci of invasive ductal carcinoma with negative nodes giving her a stage I breast cancer. She is due to see medical oncology next week for evaluation for antiestrogen therapy.  Review of Systems     Objective:   Physical Exam  Constitutional: She appears well-developed and well-nourished.  Pulmonary/Chest:         Assessment:     S/p right mastectomy/snbx for stage I breast cancer    Plan:     She is doing very well from her mastectomy. We discussed her pathology today. She will see medical oncology next week. We discussed continuing with her self exams, usually left-sided mammograms, and six-month exams. I told her I would be happy to see her in 6 months for exam.

## 2011-05-06 NOTE — Patient Instructions (Signed)

## 2011-05-10 ENCOUNTER — Other Ambulatory Visit (HOSPITAL_COMMUNITY): Payer: Medicare Other

## 2011-05-16 ENCOUNTER — Encounter: Payer: Self-pay | Admitting: Internal Medicine

## 2011-05-16 ENCOUNTER — Ambulatory Visit (INDEPENDENT_AMBULATORY_CARE_PROVIDER_SITE_OTHER): Payer: Medicare Other | Admitting: Internal Medicine

## 2011-05-16 VITALS — BP 142/80 | Temp 98.2°F | Wt 204.0 lb

## 2011-05-16 DIAGNOSIS — L309 Dermatitis, unspecified: Secondary | ICD-10-CM

## 2011-05-16 DIAGNOSIS — M1990 Unspecified osteoarthritis, unspecified site: Secondary | ICD-10-CM

## 2011-05-16 DIAGNOSIS — D0510 Intraductal carcinoma in situ of unspecified breast: Secondary | ICD-10-CM

## 2011-05-16 DIAGNOSIS — L259 Unspecified contact dermatitis, unspecified cause: Secondary | ICD-10-CM

## 2011-05-16 DIAGNOSIS — D0590 Unspecified type of carcinoma in situ of unspecified breast: Secondary | ICD-10-CM

## 2011-05-16 MED ORDER — METHYLPREDNISOLONE ACETATE 80 MG/ML IJ SUSP
80.0000 mg | Freq: Once | INTRAMUSCULAR | Status: AC
  Administered 2011-05-16: 80 mg via INTRAMUSCULAR

## 2011-05-16 NOTE — Progress Notes (Signed)
  Subjective:    Patient ID: Selena [name], female    DOB: September 20, 1935, 77 y.o.   MRN: [id number]  HPI 76 year old patient who is seen today with a chief complaint of fairly generalized pruritus. She had a rash earlier that was described as fairly generalized erythematous rash on March 8 the patient discontinued the aspirin and diclofenac prior to right breast surgery on March 21 she was discharged from the hospital on March 24 and was prescribed muscle relaxers hydrocodone and cephalexin. After one week of taking the medication she had a fairly generalized rash associated with pruritus all medications were discontinued and Benadryl begun. The rash has resolved the patient still has considerable itching it is temporarily alleviated by the Benadryl;  however the Benadryl has been quite sedating she's also been using topical cortisone cream.   Review of Systems  Skin: Positive for rash.       Objective:   Physical Exam  Constitutional: She appears well-nourished. No distress.  Pulmonary/Chest: No respiratory distress. She has no wheezes. She has no rales.  Skin: No rash noted.          Assessment & Plan:   Adverse drug effect. We'll substitute a once daily nonsedating antihistamine. We'll treat with Depo-Medrol. We'll continue Benadryl at bedtime as needed. We'll call if unimproved

## 2011-05-16 NOTE — Patient Instructions (Signed)
Use a once daily nonsedating antihistamine such as Alavert Allegra Zyrtec or Allegra Consider using Benadryl at bedtime only  Call or return to clinic prn if these symptoms worsen or fail to improve as anticipated.

## 2011-05-25 ENCOUNTER — Ambulatory Visit: Payer: Medicare Other | Admitting: Oncology

## 2011-06-06 ENCOUNTER — Telehealth: Payer: Self-pay | Admitting: *Deleted

## 2011-06-06 NOTE — Telephone Encounter (Signed)
The patient called asking if she should see Dr. Welton Flakes.  She had an appt with Dr. Welton Flakes- and our office called and cancelled the appt= stating she was rescheduled May 1.  She then came in May 1 but was told she did not have an appt and that someone would call her.   No one has called her.   I reviewed the case with Dr. Welton Flakes- and she would like to add her on tomorrow.   I asked Jacki Cones to schedule.

## 2011-06-06 NOTE — Telephone Encounter (Signed)
patient confirmed over the phone the new date and time 06-07-2011

## 2011-06-07 ENCOUNTER — Telehealth: Payer: Self-pay | Admitting: Oncology

## 2011-06-07 ENCOUNTER — Ambulatory Visit (HOSPITAL_BASED_OUTPATIENT_CLINIC_OR_DEPARTMENT_OTHER): Payer: Medicare Other | Admitting: Oncology

## 2011-06-07 ENCOUNTER — Encounter: Payer: Self-pay | Admitting: Oncology

## 2011-06-07 VITALS — BP 158/82 | HR 69 | Temp 98.0°F | Ht 67.0 in | Wt 201.7 lb

## 2011-06-07 DIAGNOSIS — D0510 Intraductal carcinoma in situ of unspecified breast: Secondary | ICD-10-CM

## 2011-06-07 DIAGNOSIS — C50419 Malignant neoplasm of upper-outer quadrant of unspecified female breast: Secondary | ICD-10-CM

## 2011-06-07 MED ORDER — ANASTROZOLE 1 MG PO TABS: 1.0000 mg | ORAL_TABLET | Freq: Every day | ORAL | Status: AC

## 2011-06-07 NOTE — Progress Notes (Signed)
OFFICE PROGRESS NOTE  CC**  Selena Boga, MD, MD [address] [place] [address] Dr. Emelia Loron Dr. Lonie Peak  DIAGNOSIS: 76 year old female with new diagnosis of ductal carcinoma in situ with associated calcifications of the right breast.   PRIOR THERAPY:  #1 patient is now status post simple mastectomy with sentinel node biopsy for a fairly extensive DCIS of the right breast.  #2 patient underwent immediate reconstruction with a tissue expander at the same time graph #3 her final pathology revealed a microscopic foci of invasive ductal carcinoma with negative nodes.  CURRENT THERAPY:patient will begin antiestrogen therapy adjuvantly  Starting today.  INTERVAL HISTORY: Selena [name] 76 y.o. female returns for followup visit after having had her surgery. Overall she is doing well. She tolerated the mastectomy well. Her final pathology did reveal a microscopic foci of invasive ductal carcinoma. All of her lymph nodes were negative for metastatic disease. She otherwise denies any fevers chills night sweats headaches shortness of breath chest pains palpitations she has no myalgias or arthralgias. She has not noticed any bleeding. In remainder of the 10 point review of systems is negative.  MEDICAL HISTORY: Past Medical History  Diagnosis Date  . BACK PAIN, UPPER 07/09/2008  . DIVERTICULOSIS, COLON 12/26/2006  . LIVER FUNCTION TESTS, ABNORMAL, HX OF 12/26/2007  . Memory loss 05/16/2007  . VERTIGO 09/07/2009  . Cataract   . Goiter     multi-nodular  . Hx of colonoscopy 2009  . Wears glasses   . Wears hearing aid     bilateral  . Wears dentures   . Complication of anesthesia   . PONV (postoperative nausea and vomiting)   . Atrial fibrillation 01/01/2007  . Vertigo     hx of;had Phenergan prn   . OSTEOARTHRITIS 12/26/2006    takes Diclofenac daily but has stopped for surgery  . Bruises easily   . Diverticulitis   . Skin cancer   . Breast cancer,  stage 1     ALLERGIES:  is allergic to codeine and codeine phosphate.  MEDICATIONS:  Current Outpatient Prescriptions  Medication Sig Dispense Refill  . aspirin 81 MG tablet Take 81 mg by mouth daily.      . diclofenac (VOLTAREN) 75 MG EC tablet       . DISCONTD: promethazine (PHENERGAN) 50 MG tablet Take 1 tablet (50 mg total) by mouth every 6 (six) hours as needed for nausea.  20 tablet  4    SURGICAL HISTORY:  Past Surgical History  Procedure Date  . Abdominal hysterectomy   . Rotator cuff repair 1999    right  . Knee surgery 2004/2010    arthroscopic/ bilateral knee replacements  . Cataract extraction w/ intraocular lens  implant, bilateral 2010    bilateral  . Colonoscopy   . Appendectomy   . Ablation of dysrhythmic focus 2009  . Tmi 1998  . Colonoscopy   . Breast biopsy 2013  . Breast reconstruction 04/14/2011    Procedure: BREAST RECONSTRUCTION;  Surgeon: Etter Sjogren, MD;  Location: [facility] OR;  Service: Plastics;  Laterality: Right;  Placement of Right Breast Tissue Expander with  use of Flex HD for Breast Reconstruction    REVIEW OF SYSTEMS:  Pertinent items are noted in HPI.   PHYSICAL EXAMINATION: General appearance: alert, cooperative and appears stated age Neck: no adenopathy, no carotid bruit, no JVD, supple, symmetrical, trachea midline and thyroid not enlarged, symmetric, no tenderness/mass/nodules Resp: clear to auscultation bilaterally and normal percussion bilaterally Back:  symmetric, no curvature. ROM normal. No CVA tenderness. Cardio: regular rate and rhythm, S1, S2 normal, no murmur, click, rub or gallop GI: soft, non-tender; bowel sounds normal; no masses,  no organomegaly Extremities: extremities normal, atraumatic, no cyanosis or edema Neurologic: Grossly normal Breast examination left breast no masses or nipple discharge,  right side reveals well-healed surgical scar there is no overlying skin changes. ECOG PERFORMANCE STATUS: 1 - Symptomatic but  completely ambulatory  Blood pressure 158/82, pulse 69, temperature 98 F (36.7 C), temperature source Oral, height 5\' 7"  (1.702 m), weight 201 lb 11.2 oz (91.491 kg).  LABORATORY DATA: Lab Results  Component Value Date   WBC 20.3* 04/15/2011   HGB 12.5 04/15/2011   HCT 36.8 04/15/2011   MCV 93.6 04/15/2011   PLT 169 04/15/2011      Chemistry      Component Value Date/Time   NA 140 04/07/2011 1337   K 4.1 04/07/2011 1337   CL 102 04/07/2011 1337   CO2 29 04/07/2011 1337   BUN 15 04/07/2011 1337   CREATININE 0.59 04/07/2011 1337      Component Value Date/Time   CALCIUM 10.1 04/07/2011 1337   ALKPHOS 95 03/23/2011 1218   AST 33 03/23/2011 1218   ALT 41* 03/23/2011 1218   BILITOT 0.6 03/23/2011 1218     ADDITIONAL INFORMATION: 2. CHROMOGENIC IN-SITU HYBRIDIZATION (BLOCK 2C) Interpretation HER-2/NEU BY CISH - NO AMPLIFICATION OF HER-2 DETECTED. THE RATIO OF HER-2: CEP 17 SIGNALS WAS 1.10. Reference range: Ratio: HER2:CEP17 < 1.8 - gene amplification not observed Ratio: HER2:CEP 17 1.8-2.2 - equivocal result Ratio: HER2:CEP17 > 2.2 - gene amplification observed Pecola Leisure MD Pathologist, Electronic Signature ( Signed 04/25/2011) 2. CHROMOGENIC IN-SITU HYBRIDIZATION (BLOCK 53F) Interpretation HER-2/NEU BY CISH - NO AMPLIFICATION OF HER-2 DETECTED. THE RATIO OF HER-2: CEP 17 SIGNALS WAS 0.75. Reference range: Ratio: HER2:CEP17 < 1.8 - gene amplification not observed Ratio: HER2:CEP 17 1.8-2.2 - equivocal result Ratio: HER2:CEP17 > 2.2 - gene amplification observed Pecola Leisure MD Pathologist, Electronic Signature ( Signed 04/25/2011) 2. PROGNOSTIC INDICATORS - ACIS (Block 53F) 1 of 4 FINAL for Messmer, Phyllicia M [phone]) ADDITIONAL INFORMATION:(continued) Results IMMUNOHISTOCHEMICAL AND MORPHOMETRIC ANALYSIS BY THE AUTOMATED CELLULAR IMAGING SYSTEM (ACIS) This carcinoma shows the following breast prognostic profile. Estrogen Receptor (Negative, <1%): 94%,POSITIVE, MODERATE  STAINING INTENSITY Progesterone Receptor (Negative, <1%): 100%,POSITIVE, STRONG STAINING INTENSITY Proliferation Marker Ki67 by M IB-1 (Low<20%): 5% All controls stained appropriately 2. PROGNOSTIC INDICATORS - ACIS (Block 2C) Results IMMUNOHISTOCHEMICAL AND MORPHOMETRIC ANALYSIS BY THE AUTOMATED CELLULAR IMAGING SYSTEM (ACIS) This carcinoma shows the following breast prognostic profile. Estrogen Receptor (Negative, <1%): 100%,POSITIVE, STRONG STAINING INTENSITY Progesterone Receptor (Negative, <1%): 6%, POSITIVE,STRONG STAINING INTENSITY Proliferation Marker Ki67 by M IB-1 (Low<20%): 29% All controls stained appropriately FINAL DIAGNOSIS Diagnosis 1. Lymph node, sentinel, biopsy, right - ONE BENIGN LYMPH NODE, NEGATIVE FOR METASTATIC CARCINOMA (0/1). 2. Breast, simple mastectomy, right - TWO MICROSCOPIC FOCI OF INVASIVE DUCTAL CARCINOMA, 0.2 CM, GRADE I, IN A BACKGROUND OF EXTENSIVE DUCTAL CARCINOMA IN SITU. - NO EVIDENCE OF ANGIOLYMPHATIC INVASION IDENTIFIED. - ALL THE RESECTION MARGINS ARE NEGATIVE FOR ATYPIA OR MALIGNANCY. . - FOUR LYMPH NODES, NEGATIVE FOR METASTATIC CARCINOMA (0/4). - PLEASE SEE ONCOLOGY TEMPLATE FOR DETAILS. Microscopic Comment 2. BREAST, INVASIVE TUMOR, WITH LYMPH NODE SAMPLING Specimen, including laterality: Right breast. Procedure: Simple mastectomy. Grade: I. Tubule formation: 1. Nuclear pleomorphism: 2. Mitotic: 1. Tumor size (glass slide measurement): Two foci, 0.2 cm (one focus is found in the subareolar area with silver metallic  biopsy clip. The second focus is found in the mid lateral mass). Margins: Negative. Invasive, distance to closest margin: All margins: More than 1 cm. In-situ, distance to closest margin: All margins: More than 1 cm. Angiolymphatic invasion: Not identified. Ductal carcinoma in situ: 2 of 4 FINAL for Kenton, Gradie M [phone]) Microscopic Comment(continued) Grade: Intermediate to high grade. Extensive intraductal  component: Present. Lobular neoplasia: N/A. Tumor focality: Two foci of invasive carcinoma. Treatment effect: No. Extent of tumor: Skin: No. Nipple: No. Skeletal muscle: N/A. Lymph nodes: # examined: 5. Lymph nodes with metastasis: Isolated tumor cells (< 0.2 mm): 0. Micrometastasis: (> 0.2 mm and < 2.0 mm): 0. Macrometastasis: (> 2.0 mm): 0. Extracapsular extension: 0. Breast prognostic profile: Will be performed and addendum report will follow. Non-neoplastic breast: Extensive fibrocystic changes and intraductal papillomas. TNM: m pT1a, pN0. Comments: There are two microscopic foci of invasive well differentiated ductal carcinoma identified in the sections taken from the subareolar mass and mid lateral mass, measuring 0.2 cm from the glass slides. No angiolymphatic invasion is identified in this material. All the final resection margins are widely clear. Case was discussed with Dr. Dwain Sarna on 04/18/2011. (HCL:eps 04/18/11) Abigail Miyamoto MD Pathologist, Electronic Signature (Case signed 04/18/18  No results found.  ASSESSMENT: 76 year old female with:  #1.  originally extensive ductal carcinoma in situ of the right breast. She was seen in the multidisciplinary breast clinic. At that time a mastectomy with sentinel lymph node biopsy was recommended. Her tumor was ER positive PR positive. She has since undergone mastectomy with sentinel node biopsy. Her final pathology did reveal foci of invasive ductal carcinoma measuring 0.2 cm. 2 foci were noted with duct well-differentiated ductal carcinoma. 5 sentinel nodes were negative. Her final staging was T1 A. N0. Postoperatively she is doing well.  #2 patient is now seen back in medical oncology for consideration of adjuvant antiestrogen therapy. We discussed her pathology today as well as the rationale for giving her adjuvant therapy risks and benefits of this were discussed with her extensively. My recommendation is for patient to begin  Arimidex 1 mg daily. Again risks and benefits of the treatment were discussed with her as well as rationale. She demonstrated understanding.   PLAN:   #1 prescription for Arimidex 1 mg daily works given to the patient. As well as literature on it.  #2 patient will be seen back in 3 months time for followup. Of course she can be seen sooner if need arises. All questions were answered.   All questions were answered. The patient knows to call the clinic with any problems, questions or concerns. We can certainly see the patient much sooner if necessary.  I spent 25 minutes counseling the patient face to face. The total time spent in the appointment was 30 minutes.    Drue Second, MD Medical/Oncology [facility] [phone] (beeper) [phone] (Office)

## 2011-06-07 NOTE — Telephone Encounter (Signed)
gve the pt her aug 2013 appt calendar along with the bone density appt

## 2011-06-07 NOTE — Patient Instructions (Signed)
1. You will start arimidex 1 mg daily. Please take the medicine at the same time every day. We discussed the common side effects. And the rest are as below:   Arimidex (also known as anastrozole) Anastrozole tablets What is this medicine? ANASTROZOLE (an AS troe zole) is used to treat breast cancer in women who have gone through menopause. Some types of breast cancer depend on estrogen to grow, and this medicine can stop tumor growth by blocking estrogen production. This medicine may be used for other purposes; ask your health care provider or pharmacist if you have questions. What should I tell my health care provider before I take this medicine? They need to know if you have any of these conditions: -liver disease -an unusual or allergic reaction to anastrozole, other medicines, foods, dyes, or preservatives -pregnant or trying to get pregnant -breast-feeding How should I use this medicine? Take this medicine by mouth with a glass of water. Follow the directions on the prescription label. You can take this medicine with or without food. Take your doses at regular intervals. Do not take your medicine more often than directed. Do not stop taking except on the advice of your doctor or health care professional. Talk to your pediatrician regarding the use of this medicine in children. Special care may be needed. Overdosage: If you think you have taken too much of this medicine contact a poison control center or emergency room at once. NOTE: This medicine is only for you. Do not share this medicine with others. What if I miss a dose? If you miss a dose, take it as soon as you can. If it is almost time for your next dose, take only that dose. Do not take double or extra doses. What may interact with this medicine? Do not take this medicine with any of the following medications: -female hormones, like estrogens or progestins and birth control pills This medicine may also interact with the following  medications: -tamoxifen This list may not describe all possible interactions. Give your health care provider a list of all the medicines, herbs, non-prescription drugs, or dietary supplements you use. Also tell them if you smoke, drink alcohol, or use illegal drugs. Some items may interact with your medicine. What should I watch for while using this medicine? Visit your doctor or health care professional for regular checks on your progress. Let your doctor or health care professional know about any unusual vaginal bleeding. Do not treat yourself for diarrhea, nausea, vomiting or other side effects. Ask your doctor or health care professional for advice. What side effects may I notice from receiving this medicine? Side effects that you should report to your doctor or health care professional as soon as possible: -allergic reactions like skin rash, itching or hives, swelling of the face, lips, or tongue -any new or unusual symptoms -breathing problems -chest pain -leg pain or swelling -vomiting Side effects that usually do not require medical attention (report to your doctor or health care professional if they continue or are bothersome): -back or bone pain -cough, or throat infection -diarrhea or constipation -dizziness -headache -hot flashes -loss of appetite -nausea -sweating -weakness and tiredness -weight gain This list may not describe all possible side effects. Call your doctor for medical advice about side effects. You may report side effects to FDA at 1-800-FDA-1088. Where should I keep my medicine? Keep out of the reach of children. Store at room temperature between 20 and 25 degrees C (68 and 77 degrees F). Throw  away any unused medicine after the expiration date. NOTE: This sheet is a summary. It may not cover all possible information. If you have questions about this medicine, talk to your doctor, pharmacist, or health care provider.  2012, [employer] Standard.  (03/23/2007 4:31:52 PM)  2. I will see you back in 3 months. Please call if you feel you need to be seen sooner.

## 2011-06-21 ENCOUNTER — Telehealth: Payer: Self-pay | Admitting: Oncology

## 2011-06-21 ENCOUNTER — Telehealth: Payer: Self-pay | Admitting: *Deleted

## 2011-06-21 NOTE — Telephone Encounter (Signed)
Pt called states  i'm dizzy and have leg pain from that medicaiton. (Anastrozole). I started taking it 05/15. And it's gradually gotten worse. I woke up last night and had, just awful leg pain." Reviewed with MD- Notified pt- MD instructed pt to stop Anastrozole and she will see her at next availaible in June. Pt verbalized understanding. onc tx sent

## 2011-06-21 NOTE — Telephone Encounter (Signed)
S/w the pt and she is Aware of her lab and md appts in june

## 2011-06-23 ENCOUNTER — Ambulatory Visit
Admission: RE | Admit: 2011-06-23 | Discharge: 2011-06-23 | Disposition: A | Discharge status: Home / Self Care | Payer: Medicare Other | Source: Ambulatory Visit | Attending: Oncology | Admitting: Oncology

## 2011-06-23 DIAGNOSIS — D0510 Intraductal carcinoma in situ of unspecified breast: Secondary | ICD-10-CM

## 2011-07-22 ENCOUNTER — Ambulatory Visit (HOSPITAL_BASED_OUTPATIENT_CLINIC_OR_DEPARTMENT_OTHER): Payer: Medicare Other | Admitting: Oncology

## 2011-07-22 ENCOUNTER — Telehealth: Payer: Self-pay | Admitting: *Deleted

## 2011-07-22 ENCOUNTER — Encounter: Payer: Self-pay | Admitting: Oncology

## 2011-07-22 ENCOUNTER — Other Ambulatory Visit (HOSPITAL_BASED_OUTPATIENT_CLINIC_OR_DEPARTMENT_OTHER): Payer: Medicare Other | Admitting: Lab

## 2011-07-22 VITALS — BP 166/80 | HR 76 | Temp 98.0°F | Ht 67.0 in | Wt 209.6 lb

## 2011-07-22 DIAGNOSIS — D0590 Unspecified type of carcinoma in situ of unspecified breast: Secondary | ICD-10-CM

## 2011-07-22 DIAGNOSIS — C50919 Malignant neoplasm of unspecified site of unspecified female breast: Secondary | ICD-10-CM

## 2011-07-22 DIAGNOSIS — C50019 Malignant neoplasm of nipple and areola, unspecified female breast: Secondary | ICD-10-CM

## 2011-07-22 DIAGNOSIS — D0510 Intraductal carcinoma in situ of unspecified breast: Secondary | ICD-10-CM

## 2011-07-22 DIAGNOSIS — Z170 Estrogen receptor positive status [ER+]: Secondary | ICD-10-CM

## 2011-07-22 LAB — COMPREHENSIVE METABOLIC PANEL
AST: 26 U/L (ref 0–37)
Albumin: 3.8 g/dL (ref 3.5–5.2)
BUN: 23 mg/dL (ref 6–23)
CO2: 25 mEq/L (ref 19–32)
Calcium: 9.5 mg/dL (ref 8.4–10.5)
Chloride: 106 mEq/L (ref 96–112)
Creatinine, Ser: 0.73 mg/dL (ref 0.50–1.10)
Glucose, Bld: 99 mg/dL (ref 70–99)
Potassium: 4 mEq/L (ref 3.5–5.3)

## 2011-07-22 LAB — CBC WITH DIFFERENTIAL/PLATELET
Basophils Absolute: 0.1 10*3/uL (ref 0.0–0.1)
EOS%: 3.6 % (ref 0.0–7.0)
Eosinophils Absolute: 0.3 10*3/uL (ref 0.0–0.5)
HCT: 41.9 % (ref 34.8–46.6)
HGB: 14 g/dL (ref 11.6–15.9)
MCH: 30.9 pg (ref 25.1–34.0)
MONO#: 0.8 10*3/uL (ref 0.1–0.9)
NEUT#: 5.2 10*3/uL (ref 1.5–6.5)
NEUT%: 59.7 % (ref 38.4–76.8)
RDW: 13.9 % (ref 11.2–14.5)
WBC: 8.7 10*3/uL (ref 3.9–10.3)
lymph#: 2.3 10*3/uL (ref 0.9–3.3)

## 2011-07-22 MED ORDER — EXEMESTANE 25 MG PO TABS: 25.0000 mg | ORAL_TABLET | Freq: Every day | ORAL | Status: DC

## 2011-07-22 NOTE — Patient Instructions (Addendum)
1. You will discontinue arimidex completely.  2. I have sent a new prescription in to your pharmacy for aromasin 25 mg to be take by mouth every day. I have only sent in 15 pills per your request. If you do well then please call so we can give you the full prescription.  3. The best number to call is [phone] from 9:00 am - 4:00  Pm on weekdays. On weekends and after hours please call [phone].

## 2011-07-22 NOTE — Telephone Encounter (Signed)
Printed out calendar and gave to the patient 

## 2011-07-22 NOTE — Progress Notes (Signed)
OFFICE PROGRESS NOTE  CC**  Selena Boga, MD [address] [place] [address] Dr. Emelia [name] Dr. Lonie [name]  DIAGNOSIS: 76 year old female with new diagnosis of ductal carcinoma in situ with associated calcifications of the right breast.   PRIOR THERAPY:  #1 patient is now status post simple mastectomy with sentinel node biopsy for a fairly extensive DCIS of the right breast.  #2 patient underwent immediate reconstruction with a tissue expander at the same time    #3 her final pathology revealed a microscopic foci of invasive ductal carcinoma with negative nodes.  CURRENT THERAPY:To begin Aromasin 25 mg daily  INTERVAL HISTORY: Selena [name] 76 y.o. female returns for followup visit. She had started Arimidex 1 mg daily when she was seen last in May 2013. About 2 weeks into her prescription patient called with complaints of leg pains cramping. We recommended that she discontinue the medication. Since then her pain has subsided completely. Of note patient does have a history of chronic upper back pain she she cannot even have people touch her back without jumping. She is quite sensitive to touch. Today she is without any aches or pains. She did tell me that it was very difficult to get to speak to my nursing the office since it was difficult she had great deal of difficulty getting connected to the right pupil. She was also concerned that her appointments were mixed up as well. Remainder of the 10 point review of systems is negative. MEDICAL HISTORY: Past Medical History  Diagnosis Date  . BACK PAIN, UPPER 07/09/2008  . DIVERTICULOSIS, COLON 12/26/2006  . LIVER FUNCTION TESTS, ABNORMAL, HX OF 12/26/2007  . Memory loss 05/16/2007  . VERTIGO 09/07/2009  . Cataract   . Goiter     multi-nodular  . Hx of colonoscopy 2009  . Wears glasses   . Wears hearing aid     bilateral  . Wears dentures   . Complication of anesthesia   . PONV (postoperative nausea  and vomiting)   . Atrial fibrillation 01/01/2007  . Vertigo     hx of;had Phenergan prn   . OSTEOARTHRITIS 12/26/2006    takes Diclofenac daily but has stopped for surgery  . Bruises easily   . Diverticulitis   . Skin cancer   . Breast cancer, stage 1     ALLERGIES:  is allergic to codeine and codeine phosphate.  MEDICATIONS:  Current Outpatient Prescriptions  Medication Sig Dispense Refill  . aspirin 81 MG tablet Take 81 mg by mouth daily.      . diclofenac (VOLTAREN) 75 MG EC tablet       . anastrozole (ARIMIDEX) 1 MG tablet Take 1 mg by mouth daily.      [name] DISCONTD: promethazine (PHENERGAN) 50 MG tablet Take 1 tablet (50 mg total) by mouth every 6 (six) hours as needed for nausea.  20 tablet  4    SURGICAL HISTORY:  Past Surgical History  Procedure Date  . Abdominal hysterectomy   . Rotator cuff repair 1999    right  . Knee surgery 2004/2010    arthroscopic/ bilateral knee replacements  . Cataract extraction w/ intraocular lens  implant, bilateral 2010    bilateral  . Colonoscopy   . Appendectomy   . Ablation of dysrhythmic focus 2009  . Tmi 1998  . Colonoscopy   . Breast biopsy 2013  . Breast reconstruction 04/14/2011    Procedure: BREAST RECONSTRUCTION;  Surgeon: Selena Sjogren, MD;  Location: [facility] OR;  Service: Government social research officer;  Laterality: Right;  Placement of Right Breast Tissue Expander with  use of Flex HD for Breast Reconstruction    REVIEW OF SYSTEMS:  Pertinent items are noted in HPI.   PHYSICAL EXAMINATION: General appearance: alert, cooperative and appears stated age Neck: no adenopathy, no carotid bruit, no JVD, supple, symmetrical, trachea midline and thyroid not enlarged, symmetric, no tenderness/mass/nodules Resp: clear to auscultation bilaterally and normal percussion bilaterally Back: symmetric, no curvature. ROM normal. No CVA tenderness. Cardio: regular rate and rhythm, S1, S2 normal, no murmur, click, rub or gallop GI: soft, non-tender; bowel sounds normal;  no masses,  no organomegaly Extremities: extremities normal, atraumatic, no cyanosis or edema Neurologic: Grossly normal Breast examination left breast no masses or nipple discharge,  right side reveals well-healed surgical scar there is no overlying skin changes. ECOG PERFORMANCE STATUS: 1 - Symptomatic but completely ambulatory  Blood pressure 166/80, pulse 76, temperature 98 F (36.7 C), temperature source Oral, height 5\' 7"  (1.702 [name]), weight 209 lb 9.6 oz (95.074 kg).  LABORATORY DATA: Lab Results  Component Value Date   WBC 8.7 07/22/2011   HGB 14.0 07/22/2011   HCT 41.9 07/22/2011   MCV 92.5 07/22/2011   PLT 175 07/22/2011      Chemistry      Component Value Date/Time   NA 140 04/07/2011 1337   K 4.1 04/07/2011 1337   CL 102 04/07/2011 1337   CO2 29 04/07/2011 1337   BUN 15 04/07/2011 1337   CREATININE 0.59 04/07/2011 1337      Component Value Date/Time   CALCIUM 10.1 04/07/2011 1337   ALKPHOS 95 03/23/2011 1218   AST 33 03/23/2011 1218   ALT 41* 03/23/2011 1218   BILITOT 0.6 03/23/2011 1218     ADDITIONAL INFORMATION: 2. CHROMOGENIC IN-SITU HYBRIDIZATION (BLOCK 2C) Interpretation HER-2/NEU BY CISH - NO AMPLIFICATION OF HER-2 DETECTED. THE RATIO OF HER-2: CEP 17 SIGNALS WAS 1.10. Reference range: Ratio: HER2:CEP17 < 1.8 - gene amplification not observed Ratio: HER2:CEP 17 1.8-2.2 - equivocal result Ratio: HER2:CEP17 > 2.2 - gene amplification observed Selena Leisure MD Pathologist, Electronic Signature ( Signed 04/25/2011) 2. CHROMOGENIC IN-SITU HYBRIDIZATION (BLOCK 66F) Interpretation HER-2/NEU BY CISH - NO AMPLIFICATION OF HER-2 DETECTED. THE RATIO OF HER-2: CEP 17 SIGNALS WAS 0.75. Reference range: Ratio: HER2:CEP17 < 1.8 - gene amplification not observed Ratio: HER2:CEP 17 1.8-2.2 - equivocal result Ratio: HER2:CEP17 > 2.2 - gene amplification observed Selena Leisure MD Pathologist, Electronic Signature ( Signed 04/25/2011) 2. PROGNOSTIC INDICATORS - ACIS (Block 66F) 1  of 4 FINAL for Selena [name] [phone]) ADDITIONAL INFORMATION:(continued) Results IMMUNOHISTOCHEMICAL AND MORPHOMETRIC ANALYSIS BY THE AUTOMATED CELLULAR IMAGING SYSTEM (ACIS) This carcinoma shows the following breast prognostic profile. Estrogen Receptor (Negative, <1%): 94%,POSITIVE, MODERATE STAINING INTENSITY Progesterone Receptor (Negative, <1%): 100%,POSITIVE, STRONG STAINING INTENSITY Proliferation Marker Ki67 by [name] IB-1 (Low<20%): 5% All controls stained appropriately 2. PROGNOSTIC INDICATORS - ACIS (Block 2C) Results IMMUNOHISTOCHEMICAL AND MORPHOMETRIC ANALYSIS BY THE AUTOMATED CELLULAR IMAGING SYSTEM (ACIS) This carcinoma shows the following breast prognostic profile. Estrogen Receptor (Negative, <1%): 100%,POSITIVE, STRONG STAINING INTENSITY Progesterone Receptor (Negative, <1%): 6%, POSITIVE,STRONG STAINING INTENSITY Proliferation Marker Ki67 by [name] IB-1 (Low<20%): 29% All controls stained appropriately FINAL DIAGNOSIS Diagnosis 1. Lymph node, sentinel, biopsy, right - ONE BENIGN LYMPH NODE, NEGATIVE FOR METASTATIC CARCINOMA (0/1). 2. Breast, simple mastectomy, right - TWO MICROSCOPIC FOCI OF INVASIVE DUCTAL CARCINOMA, 0.2 CM, GRADE I, IN A BACKGROUND OF EXTENSIVE DUCTAL CARCINOMA IN SITU. - NO EVIDENCE OF ANGIOLYMPHATIC INVASION IDENTIFIED. -  ALL THE RESECTION MARGINS ARE NEGATIVE FOR ATYPIA OR MALIGNANCY. . - FOUR LYMPH NODES, NEGATIVE FOR METASTATIC CARCINOMA (0/4). - PLEASE SEE ONCOLOGY TEMPLATE FOR DETAILS. Microscopic Comment 2. BREAST, INVASIVE TUMOR, WITH LYMPH NODE SAMPLING Specimen, including laterality: Right breast. Procedure: Simple mastectomy. Grade: I. Tubule formation: 1. Nuclear pleomorphism: 2. Mitotic: 1. Tumor size (glass slide measurement): Two foci, 0.2 cm (one focus is found in the subareolar area with silver metallic biopsy clip. The second focus is found in the mid lateral mass). Margins: Negative. Invasive, distance to closest margin:  All margins: More than 1 cm. In-situ, distance to closest margin: All margins: More than 1 cm. Angiolymphatic invasion: Not identified. Ductal carcinoma in situ: 2 of 4 FINAL for Selena [name] [phone]) Microscopic Comment(continued) Grade: Intermediate to high grade. Extensive intraductal component: Present. Lobular neoplasia: N/A. Tumor focality: Two foci of invasive carcinoma. Treatment effect: No. Extent of tumor: Skin: No. Nipple: No. Skeletal muscle: N/A. Lymph nodes: # examined: 5. Lymph nodes with metastasis: Isolated tumor cells (< 0.2 mm): 0. Micrometastasis: (> 0.2 mm and < 2.0 mm): 0. Macrometastasis: (> 2.0 mm): 0. Extracapsular extension: 0. Breast prognostic profile: Will be performed and addendum report will follow. Non-neoplastic breast: Extensive fibrocystic changes and intraductal papillomas. TNM: [name] pT1a, pN0. Comments: There are two microscopic foci of invasive well differentiated ductal carcinoma identified in the sections taken from the subareolar mass and mid lateral mass, measuring 0.2 cm from the glass slides. No angiolymphatic invasion is identified in this material. All the final resection margins are widely clear. Case was discussed with Dr. Dwain Sarna on 04/18/2011. (HCL:eps 04/18/11) Abigail Miyamoto MD Pathologist, Electronic Signature (Case signed 04/18/18  No results found.  ASSESSMENT: 76 year old female with:  #1.  originally extensive ductal carcinoma in situ of the right breast. She was seen in the multidisciplinary breast clinic. At that time a mastectomy with sentinel lymph node biopsy was recommended. Her tumor was ER positive PR positive. She has since undergone mastectomy with sentinel node biopsy. Her final pathology did reveal foci of invasive ductal carcinoma measuring 0.2 cm. 2 foci were noted with duct well-differentiated ductal carcinoma. 5 sentinel nodes were negative. Her final staging was T1 A. N0. Postoperatively she is doing  well.  #2 patient was begun on Arimidex in May but she could not tolerate it after 2 weeks and it was discontinued.  #3 I have discussed alternative medication such as Aromasin 25 mg daily. She is willing to try this. Risks and benefits and side effects were discussed with her today.  PLAN:  #1 prescription for Aromasin has been sent to her pharmacy. Per patient's request I only sent 15 pills in. She will call us if she starts having any problems. However if she does not have problems then she will have a scan a full prescription.  #2 I will plan on seeing the patient back in 3 months time or sooner if need arises.  All questions were answered. The patient knows to call the clinic with any problems, questions or concerns. We can certainly see the patient much sooner if necessary.  I spent 25 minutes counseling the patient face to face. The total time spent in the appointment was 30 minutes.    Drue Second, MD Medical/Oncology [facility] [phone] (beeper) [phone] (Office)

## 2011-08-01 ENCOUNTER — Telehealth: Payer: Self-pay | Admitting: Oncology

## 2011-08-01 ENCOUNTER — Other Ambulatory Visit: Payer: Self-pay | Admitting: Medical Oncology

## 2011-08-01 MED ORDER — EXEMESTANE 25 MG PO TABS: 25.0000 mg | ORAL_TABLET | Freq: Every day | ORAL | Status: AC

## 2011-08-01 NOTE — Telephone Encounter (Signed)
S/w the pt and she is aware of her sept 2013 appts °

## 2011-09-19 ENCOUNTER — Other Ambulatory Visit: Payer: Medicare Other | Admitting: Lab

## 2011-09-19 ENCOUNTER — Ambulatory Visit: Payer: Medicare Other | Admitting: Oncology

## 2011-10-10 ENCOUNTER — Encounter: Payer: Self-pay | Admitting: Oncology

## 2011-10-10 ENCOUNTER — Ambulatory Visit (HOSPITAL_BASED_OUTPATIENT_CLINIC_OR_DEPARTMENT_OTHER): Payer: Medicare Other | Admitting: Oncology

## 2011-10-10 ENCOUNTER — Other Ambulatory Visit (HOSPITAL_BASED_OUTPATIENT_CLINIC_OR_DEPARTMENT_OTHER): Payer: Medicare Other | Admitting: Lab

## 2011-10-10 ENCOUNTER — Ambulatory Visit: Payer: Medicare Other | Admitting: Lab

## 2011-10-10 ENCOUNTER — Telehealth: Payer: Self-pay | Admitting: Oncology

## 2011-10-10 ENCOUNTER — Ambulatory Visit (HOSPITAL_COMMUNITY)
Admission: RE | Admit: 2011-10-10 | Discharge: 2011-10-10 | Disposition: A | Discharge status: Home / Self Care | Payer: Medicare Other | Source: Ambulatory Visit | Attending: Oncology | Admitting: Oncology

## 2011-10-10 VITALS — BP 171/80 | HR 89 | Temp 97.9°F | Resp 20 | Ht 67.0 in | Wt 210.3 lb

## 2011-10-10 DIAGNOSIS — R0602 Shortness of breath: Secondary | ICD-10-CM

## 2011-10-10 DIAGNOSIS — Z170 Estrogen receptor positive status [ER+]: Secondary | ICD-10-CM

## 2011-10-10 DIAGNOSIS — E559 Vitamin D deficiency, unspecified: Secondary | ICD-10-CM

## 2011-10-10 DIAGNOSIS — D0510 Intraductal carcinoma in situ of unspecified breast: Secondary | ICD-10-CM

## 2011-10-10 DIAGNOSIS — D0590 Unspecified type of carcinoma in situ of unspecified breast: Secondary | ICD-10-CM

## 2011-10-10 DIAGNOSIS — C50019 Malignant neoplasm of nipple and areola, unspecified female breast: Secondary | ICD-10-CM

## 2011-10-10 DIAGNOSIS — C50919 Malignant neoplasm of unspecified site of unspecified female breast: Secondary | ICD-10-CM

## 2011-10-10 LAB — CBC WITH DIFFERENTIAL/PLATELET
BASO%: 0.9 % (ref 0.0–2.0)
Basophils Absolute: 0.1 10*3/uL (ref 0.0–0.1)
EOS%: 5 % (ref 0.0–7.0)
HCT: 46.2 % (ref 34.8–46.6)
HGB: 15.3 g/dL (ref 11.6–15.9)
MCH: 31.3 pg (ref 25.1–34.0)
MONO#: 0.6 10*3/uL (ref 0.1–0.9)
RDW: 13.9 % (ref 11.2–14.5)
WBC: 9.1 10*3/uL (ref 3.9–10.3)
lymph#: 2 10*3/uL (ref 0.9–3.3)

## 2011-10-10 LAB — COMPREHENSIVE METABOLIC PANEL (CC13)
AST: 25 U/L (ref 5–34)
Albumin: 4 g/dL (ref 3.5–5.0)
Alkaline Phosphatase: 97 U/L (ref 40–150)
Calcium: 10.1 mg/dL (ref 8.4–10.4)
Chloride: 106 mEq/L (ref 98–107)
Potassium: 4.5 mEq/L (ref 3.5–5.1)
Sodium: 141 mEq/L (ref 136–145)
Total Protein: 7 g/dL (ref 6.4–8.3)

## 2011-10-10 MED ORDER — LETROZOLE 2.5 MG PO TABS: 2.5000 mg | ORAL_TABLET | Freq: Every day | ORAL | Status: DC

## 2011-10-10 NOTE — Patient Instructions (Addendum)
Proceed with letrozole  Chest X-ray today  I will see you back in 6 months

## 2011-10-10 NOTE — Progress Notes (Signed)
OFFICE PROGRESS NOTE  CC**  Selena Boga, MD [address] [place] [address] Dr. Emelia Loron Dr. Lonie Peak  DIAGNOSIS: 76 year old female with new diagnosis of ductal carcinoma in situ with associated calcifications of the right breast.   PRIOR THERAPY:  #1 patient is now status post simple mastectomy with sentinel node biopsy for a fairly extensive DCIS of the right breast.  #2 patient underwent immediate reconstruction with a tissue expander at the same time    #3 her final pathology revealed a microscopic foci of invasive ductal carcinoma with negative nodes.  CURRENT THERAPY: To begin letrozole 2.5 mg daily  INTERVAL HISTORY: Selena [name] 76 y.o. female returns for followup visit. So far patient has not been able to tolerate the Aromasin either. She and I. therefore discussed using letrozole 2.5 mg daily. With the Aromasin she has been experiencing aches and pains which are very significant especially in her joints. She does not have any nausea or vomiting no bleeding problems. She is experiencing hot flashes. Remainder of the review of systems is unremarkable  MEDICAL HISTORY: Past Medical History  Diagnosis Date  . BACK PAIN, UPPER 07/09/2008  . DIVERTICULOSIS, COLON 12/26/2006  . LIVER FUNCTION TESTS, ABNORMAL, HX OF 12/26/2007  . Memory loss 05/16/2007  . VERTIGO 09/07/2009  . Cataract   . Goiter     multi-nodular  . Hx of colonoscopy 2009  . Wears glasses   . Wears hearing aid     bilateral  . Wears dentures   . Complication of anesthesia   . PONV (postoperative nausea and vomiting)   . Atrial fibrillation 01/01/2007  . Vertigo     hx of;had Phenergan prn   . OSTEOARTHRITIS 12/26/2006    takes Diclofenac daily but has stopped for surgery  . Bruises easily   . Diverticulitis   . Skin cancer   . Breast cancer, stage 1     ALLERGIES:  is allergic to codeine and codeine phosphate.  MEDICATIONS:  Current Outpatient  Prescriptions  Medication Sig Dispense Refill  . aspirin 81 MG tablet Take 81 mg by mouth daily.      . diclofenac (VOLTAREN) 75 MG EC tablet       . exemestane (AROMASIN) 25 MG tablet Take 25 mg by mouth daily after breakfast.      . DISCONTD: promethazine (PHENERGAN) 50 MG tablet Take 1 tablet (50 mg total) by mouth every 6 (six) hours as needed for nausea.  20 tablet  4    SURGICAL HISTORY:  Past Surgical History  Procedure Date  . Abdominal hysterectomy   . Rotator cuff repair 1999    right  . Knee surgery 2004/2010    arthroscopic/ bilateral knee replacements  . Cataract extraction w/ intraocular lens  implant, bilateral 2010    bilateral  . Colonoscopy   . Appendectomy   . Ablation of dysrhythmic focus 2009  . Tmi 1998  . Colonoscopy   . Breast biopsy 2013  . Breast reconstruction 04/14/2011    Procedure: BREAST RECONSTRUCTION;  Surgeon: Etter Sjogren, MD;  Location: [facility] OR;  Service: Plastics;  Laterality: Right;  Placement of Right Breast Tissue Expander with  use of Flex HD for Breast Reconstruction    REVIEW OF SYSTEMS:  Pertinent items are noted in HPI.   PHYSICAL EXAMINATION: General appearance: alert, cooperative and appears stated age Neck: no adenopathy, no carotid bruit, no JVD, supple, symmetrical, trachea midline and thyroid not enlarged, symmetric, no tenderness/mass/nodules Resp: clear to  auscultation bilaterally and normal percussion bilaterally Back: symmetric, no curvature. ROM normal. No CVA tenderness. Cardio: regular rate and rhythm, S1, S2 normal, no murmur, click, rub or gallop GI: soft, non-tender; bowel sounds normal; no masses,  no organomegaly Extremities: extremities normal, atraumatic, no cyanosis or edema Neurologic: Grossly normal Breast examination left breast no masses or nipple discharge,  right side reveals well-healed surgical scar there is no overlying skin changes. ECOG PERFORMANCE STATUS: 1 - Symptomatic but completely  ambulatory  Blood pressure 171/80, pulse 89, temperature 97.9 F (36.6 C), temperature source Oral, resp. rate 20, height 5\' 7"  (1.702 m), weight 210 lb 4.8 oz (95.391 kg).  LABORATORY DATA: Lab Results  Component Value Date   WBC 9.1 10/10/2011   HGB 15.3 10/10/2011   HCT 46.2 10/10/2011   MCV 94.2 10/10/2011   PLT 188 10/10/2011      Chemistry      Component Value Date/Time   NA 141 10/10/2011 1333   NA 141 07/22/2011 1448   K 4.5 10/10/2011 1333   K 4.0 07/22/2011 1448   CL 106 10/10/2011 1333   CL 106 07/22/2011 1448   CO2 26 10/10/2011 1333   CO2 25 07/22/2011 1448   BUN 21.0 10/10/2011 1333   BUN 23 07/22/2011 1448   CREATININE 0.8 10/10/2011 1333   CREATININE 0.73 07/22/2011 1448      Component Value Date/Time   CALCIUM 10.1 10/10/2011 1333   CALCIUM 9.5 07/22/2011 1448   ALKPHOS 97 10/10/2011 1333   ALKPHOS 90 07/22/2011 1448   AST 25 10/10/2011 1333   AST 26 07/22/2011 1448   ALT 34 10/10/2011 1333   ALT 30 07/22/2011 1448   BILITOT 0.70 10/10/2011 1333   BILITOT 0.4 07/22/2011 1448     ADDITIONAL INFORMATION: 2. CHROMOGENIC IN-SITU HYBRIDIZATION (BLOCK 2C) Interpretation HER-2/NEU BY CISH - NO AMPLIFICATION OF HER-2 DETECTED. THE RATIO OF HER-2: CEP 17 SIGNALS WAS 1.10. Reference range: Ratio: HER2:CEP17 < 1.8 - gene amplification not observed Ratio: HER2:CEP 17 1.8-2.2 - equivocal result Ratio: HER2:CEP17 > 2.2 - gene amplification observed Pecola Leisure MD Pathologist, Electronic Signature ( Signed 04/25/2011) 2. CHROMOGENIC IN-SITU HYBRIDIZATION (BLOCK 50F) Interpretation HER-2/NEU BY CISH - NO AMPLIFICATION OF HER-2 DETECTED. THE RATIO OF HER-2: CEP 17 SIGNALS WAS 0.75. Reference range: Ratio: HER2:CEP17 < 1.8 - gene amplification not observed Ratio: HER2:CEP 17 1.8-2.2 - equivocal result Ratio: HER2:CEP17 > 2.2 - gene amplification observed Pecola Leisure MD Pathologist, Electronic Signature ( Signed 04/25/2011) 2. PROGNOSTIC INDICATORS - ACIS (Block 50F) 1 of  4 FINAL for Arseneault, Sam M ([phone]) ADDITIONAL INFORMATION:(continued) Results IMMUNOHISTOCHEMICAL AND MORPHOMETRIC ANALYSIS BY THE AUTOMATED CELLULAR IMAGING SYSTEM (ACIS) This carcinoma shows the following breast prognostic profile. Estrogen Receptor (Negative, <1%): 94%,POSITIVE, MODERATE STAINING INTENSITY Progesterone Receptor (Negative, <1%): 100%,POSITIVE, STRONG STAINING INTENSITY Proliferation Marker Ki67 by M IB-1 (Low<20%): 5% All controls stained appropriately 2. PROGNOSTIC INDICATORS - ACIS (Block 2C) Results IMMUNOHISTOCHEMICAL AND MORPHOMETRIC ANALYSIS BY THE AUTOMATED CELLULAR IMAGING SYSTEM (ACIS) This carcinoma shows the following breast prognostic profile. Estrogen Receptor (Negative, <1%): 100%,POSITIVE, STRONG STAINING INTENSITY Progesterone Receptor (Negative, <1%): 6%, POSITIVE,STRONG STAINING INTENSITY Proliferation Marker Ki67 by M IB-1 (Low<20%): 29% All controls stained appropriately FINAL DIAGNOSIS Diagnosis 1. Lymph node, sentinel, biopsy, right - ONE BENIGN LYMPH NODE, NEGATIVE FOR METASTATIC CARCINOMA (0/1). 2. Breast, simple mastectomy, right - TWO MICROSCOPIC FOCI OF INVASIVE DUCTAL CARCINOMA, 0.2 CM, GRADE I, IN A BACKGROUND OF EXTENSIVE DUCTAL CARCINOMA IN SITU. - NO EVIDENCE OF ANGIOLYMPHATIC INVASION IDENTIFIED. -  ALL THE RESECTION MARGINS ARE NEGATIVE FOR ATYPIA OR MALIGNANCY. . - FOUR LYMPH NODES, NEGATIVE FOR METASTATIC CARCINOMA (0/4). - PLEASE SEE ONCOLOGY TEMPLATE FOR DETAILS. Microscopic Comment 2. BREAST, INVASIVE TUMOR, WITH LYMPH NODE SAMPLING Specimen, including laterality: Right breast. Procedure: Simple mastectomy. Grade: I. Tubule formation: 1. Nuclear pleomorphism: 2. Mitotic: 1. Tumor size (glass slide measurement): Two foci, 0.2 cm (one focus is found in the subareolar area with silver metallic biopsy clip. The second focus is found in the mid lateral mass). Margins: Negative. Invasive, distance to closest margin:  All margins: More than 1 cm. In-situ, distance to closest margin: All margins: More than 1 cm. Angiolymphatic invasion: Not identified. Ductal carcinoma in situ: 2 of 4 FINAL for Knisely, Davy M [phone]) Microscopic Comment(continued) Grade: Intermediate to high grade. Extensive intraductal component: Present. Lobular neoplasia: N/A. Tumor focality: Two foci of invasive carcinoma. Treatment effect: No. Extent of tumor: Skin: No. Nipple: No. Skeletal muscle: N/A. Lymph nodes: # examined: 5. Lymph nodes with metastasis: Isolated tumor cells (< 0.2 mm): 0. Micrometastasis: (> 0.2 mm and < 2.0 mm): 0. Macrometastasis: (> 2.0 mm): 0. Extracapsular extension: 0. Breast prognostic profile: Will be performed and addendum report will follow. Non-neoplastic breast: Extensive fibrocystic changes and intraductal papillomas. TNM: m pT1a, pN0. Comments: There are two microscopic foci of invasive well differentiated ductal carcinoma identified in the sections taken from the subareolar mass and mid lateral mass, measuring 0.2 cm from the glass slides. No angiolymphatic invasion is identified in this material. All the final resection margins are widely clear. Case was discussed with Dr. Dwain Sarna on 04/18/2011. (HCL:eps 04/18/11) Abigail Miyamoto MD Pathologist, Electronic Signature (Case signed 04/18/18  No results found.  ASSESSMENT: 76 year old female with:  #1.  originally extensive ductal carcinoma in situ of the right breast. She was seen in the multidisciplinary breast clinic. At that time a mastectomy with sentinel lymph node biopsy was recommended. Her tumor was ER positive PR positive. She has since undergone mastectomy with sentinel node biopsy. Her final pathology did reveal foci of invasive ductal carcinoma measuring 0.2 cm. 2 foci were noted with duct well-differentiated ductal carcinoma. 5 sentinel nodes were negative. Her final staging was T1 A. N0. Postoperatively she is doing  well.  #2 patient was begun on Arimidex in May but she could not tolerate it after 2 weeks and it was discontinued.  #3 I have discussed alternative medication such as Aromasin 25 mg daily. She is willing to try this. Risks and benefits and side effects were discussed with her today. Patient has not been able to tolerate Aromasin either. At this time therefore I have recommended that she begin letrozole 2.5 mg daily.  #4 patient is having some shortness of breath we will get a chest x-ray and call her with the results.  PLAN:  #1 letrozole 2.5 mg daily risks and benefits of this have been discussed with her.  #2 chest x-ray today.  #3 she will be seen back in 6 months time for followup  All questions were answered. The patient knows to call the clinic with any problems, questions or concerns. We can certainly see the patient much sooner if necessary.  I spent 25 minutes counseling the patient face to face. The total time spent in the appointment was 30 minutes.    Drue Second, MD Medical/Oncology [facility] [phone] (beeper) [phone] (Office)

## 2011-10-10 NOTE — Telephone Encounter (Signed)
Pt sent back to lb and to xray. gv pt appt schedule for march 2014.

## 2011-10-12 ENCOUNTER — Telehealth: Payer: Self-pay | Admitting: *Deleted

## 2011-10-12 NOTE — Telephone Encounter (Signed)
Per MD Chest Xray looks good. Pt verbalized understanding

## 2011-10-12 NOTE — Telephone Encounter (Signed)
Message copied by GARNER, Gerald Leitz on Wed Oct 12, 2011  4:23 PM ------      Message from: Victorino December      Created: Wed Oct 12, 2011  1:45 PM       Call patient: chest x-ray looks good

## 2011-11-21 ENCOUNTER — Ambulatory Visit (INDEPENDENT_AMBULATORY_CARE_PROVIDER_SITE_OTHER): Payer: Medicare Other | Admitting: General Surgery

## 2011-11-21 ENCOUNTER — Encounter (INDEPENDENT_AMBULATORY_CARE_PROVIDER_SITE_OTHER): Payer: Self-pay | Admitting: General Surgery

## 2011-11-21 VITALS — BP 127/79 | HR 75 | Temp 98.0°F | Resp 12 | Ht 67.0 in | Wt 210.2 lb

## 2011-11-21 DIAGNOSIS — Z853 Personal history of malignant neoplasm of breast: Secondary | ICD-10-CM

## 2011-11-21 NOTE — Progress Notes (Signed)
Subjective:     Patient ID: Selena [name], female   DOB: January 17, 1936, 76 y.o.   MRN: [id number]  HPI This is a 76 year old female seen in the multi-disciplinary clinic with a newly diagnosed right breast DCIS. She had a fairly extensive area of DCIS. We elected to do a right simple mastectomy with a sentinel node biopsy and she had an immediate reconstruction with a tissue expander at the same time. [name] Her final pathology showed 2 microscopic foci of invasive ductal carcinoma with negative nodes giving her a stage I breast cancer. She tried a couple of anti-estrogen's and has ended up on letrozole for which she is tolerating well right now. She has her implant placement scheduled for November 5. She really has no complaints and is otherwise doing very well.   Review of Systems     Objective:   Physical Exam  Vitals reviewed. Constitutional: She appears well-developed and well-nourished.  Pulmonary/Chest: Right breast exhibits no mass. Left breast exhibits no inverted nipple, no mass, no nipple discharge, no skin change and no tenderness.    Lymphadenopathy:    She has no cervical adenopathy.       Right: No supraclavicular adenopathy present.       Left: No supraclavicular adenopathy present.       Assessment:     History stage I right breast cancer    Plan:     She has no clinical evidence of recurrence right now. She is otherwise doing well. I think she is good to go ahead and have her reconstruction done. I will plan on seeing her back in one year. She has her mammogram scheduled for February. She is going to continue her own self exams.

## 2011-11-21 NOTE — Patient Instructions (Signed)

## 2011-11-28 ENCOUNTER — Telehealth: Payer: Self-pay | Admitting: Oncology

## 2011-11-28 NOTE — Telephone Encounter (Signed)
S/w the pt and she is aware of her r/s 03/26/2012 appts that hve been r/s to 04/05/2012 due to the md's schedule.

## 2011-12-26 ENCOUNTER — Ambulatory Visit (INDEPENDENT_AMBULATORY_CARE_PROVIDER_SITE_OTHER): Payer: Medicare Other | Admitting: Family Medicine

## 2011-12-26 ENCOUNTER — Encounter: Payer: Self-pay | Admitting: Family Medicine

## 2011-12-26 VITALS — BP 138/78 | HR 84 | Temp 98.5°F | Wt 210.0 lb

## 2011-12-26 DIAGNOSIS — R300 Dysuria: Secondary | ICD-10-CM

## 2011-12-26 DIAGNOSIS — N390 Urinary tract infection, site not specified: Secondary | ICD-10-CM

## 2011-12-26 LAB — URINALYSIS, ROUTINE W REFLEX MICROSCOPIC
Specific Gravity, Urine: 1.015 (ref 1.000–1.030)
Urine Glucose: NEGATIVE
Urobilinogen, UA: 0.2 (ref 0.0–1.0)
pH: 6 (ref 5.0–8.0)

## 2011-12-26 LAB — POCT URINALYSIS DIPSTICK
Spec Grav, UA: 1.02
Urobilinogen, UA: 0.2
pH, UA: 7

## 2011-12-26 MED ORDER — CIPROFLOXACIN HCL 250 MG PO TABS: 250.0000 mg | ORAL_TABLET | Freq: Two times a day (BID) | ORAL | Status: DC

## 2011-12-26 NOTE — Addendum Note (Signed)
Addended by: Azucena Freed on: 12/26/2011 01:57 PM   Modules accepted: Orders

## 2011-12-26 NOTE — Patient Instructions (Signed)
Urinary Tract Infection Urinary tract infections (UTIs) can develop anywhere along your urinary tract. Your urinary tract is your body's drainage system for removing wastes and extra water. Your urinary tract includes two kidneys, two ureters, a bladder, and a urethra. Your kidneys are a pair of bean-shaped organs. Each kidney is about the size of your fist. They are located below your ribs, one on each side of your spine. CAUSES Infections are caused by microbes, which are microscopic organisms, including fungi, viruses, and bacteria. These organisms are so small that they can only be seen through a microscope. Bacteria are the microbes that most commonly cause UTIs. SYMPTOMS  Symptoms of UTIs may vary by age and gender of the patient and by the location of the infection. Symptoms in young women typically include a frequent and intense urge to urinate and a painful, burning feeling in the bladder or urethra during urination. Older women and men are more likely to be tired, shaky, and weak and have muscle aches and abdominal pain. A fever may mean the infection is in your kidneys. Other symptoms of a kidney infection include pain in your back or sides below the ribs, nausea, and vomiting. DIAGNOSIS To diagnose a UTI, your caregiver will ask you about your symptoms. Your caregiver also will ask to provide a urine sample. The urine sample will be tested for bacteria and white blood cells. White blood cells are made by your body to help fight infection. TREATMENT  Typically, UTIs can be treated with medication. Because most UTIs are caused by a bacterial infection, they usually can be treated with the use of antibiotics. The choice of antibiotic and length of treatment depend on your symptoms and the type of bacteria causing your infection. HOME CARE INSTRUCTIONS  If you were prescribed antibiotics, take them exactly as your caregiver instructs you. Finish the medication even if you feel better after you  have only taken some of the medication.  Can use Azo for symptoms as directed on package  Drink enough water and fluids to keep your urine clear or pale yellow.  Avoid caffeine, tea, and carbonated beverages. They tend to irritate your bladder.  Empty your bladder often. Avoid holding urine for long periods of time.  Empty your bladder before and after sexual intercourse.  After a bowel movement, women should cleanse from front to back. Use each tissue only once. SEEK MEDICAL CARE IF:   You have back pain.  You develop a fever.  Your symptoms do not begin to resolve within 3 days. SEEK IMMEDIATE MEDICAL CARE IF:   You have severe back pain or lower abdominal pain.  You develop chills.  You have nausea or vomiting.  You have continued burning or discomfort with urination. MAKE SURE YOU:   Understand these instructions.  Will watch your condition.  Will get help right away if you are not doing well or get worse. Document Released: 10/20/2004 Document Revised: 07/12/2011 Document Reviewed: 02/18/2011 [facility] Patient Information 2013 [place], [place].

## 2011-12-26 NOTE — Progress Notes (Addendum)
Chief Complaint  Patient presents with  . Cystitis    x 3 to 4 days     HPI:  Started: 2 days ago Symptoms: burning when she urinates Denies: fever, chills, flank or pelvic/abd pain, hematuria, NVD, vaginal symptoms Hx of UTI a long time ago  ROS: See pertinent positives and negatives per HPI.  Past Medical History  Diagnosis Date  . BACK PAIN, UPPER 07/09/2008  . DIVERTICULOSIS, COLON 12/26/2006  . LIVER FUNCTION TESTS, ABNORMAL, HX OF 12/26/2007  . Memory loss 05/16/2007  . VERTIGO 09/07/2009  . Cataract   . Goiter     multi-nodular  . Hx of colonoscopy 2009  . Wears glasses   . Wears hearing aid     bilateral  . Wears dentures   . Complication of anesthesia   . PONV (postoperative nausea and vomiting)   . Atrial fibrillation 01/01/2007  . Vertigo     hx of;had Phenergan prn   . OSTEOARTHRITIS 12/26/2006    takes Diclofenac daily but has stopped for surgery  . Bruises easily   . Diverticulitis   . Skin cancer   . Breast cancer, stage 1     Family History  Problem Relation Age of Onset  . Colon cancer Mother   . Cancer Mother     colon  . Colon cancer Maternal Aunt   . Cancer Maternal Uncle   . Prostate cancer Maternal Uncle   . Prostate cancer Maternal Uncle   . Other Maternal Uncle   . Anesthesia problems Neg Hx   . Hypotension Neg Hx   . Malignant hyperthermia Neg Hx   . Pseudochol deficiency Neg Hx   . Cancer Sister     kidney  . Cancer Brother     lymph node    History   Social History  . Marital Status: Widowed    Spouse Name: N/A    Number of Children: N/A  . Years of Education: N/A   Social History Main Topics  . Smoking status: Never Smoker   . Smokeless tobacco: Never Used  . Alcohol Use: No  . Drug Use: No  . Sexually Active: Yes    Birth Control/ Protection: Surgical   Other Topics Concern  . None   Social History Narrative  . None    Current outpatient prescriptions:aspirin 81 MG tablet, Take 81 mg by mouth daily., Disp: ,  Rfl: ;  diclofenac (VOLTAREN) 75 MG EC tablet, , Disp: , Rfl: ;  letrozole (FEMARA) 2.5 MG tablet, Take 1 tablet (2.5 mg total) by mouth daily., Disp: 90 tablet, Rfl: 12;  ciprofloxacin (CIPRO) 250 MG tablet, Take 1 tablet (250 mg total) by mouth 2 (two) times daily., Disp: 6 tablet, Rfl: 0 [DISCONTINUED] promethazine (PHENERGAN) 50 MG tablet, Take 1 tablet (50 mg total) by mouth every 6 (six) hours as needed for nausea., Disp: 20 tablet, Rfl: 4  EXAM:  Filed Vitals:   12/26/11 1020  BP: 138/78  Pulse: 84  Temp: 98.5 F (36.9 C)    There is no height on file to calculate BMI.  GENERAL: vitals reviewed and listed above, alert, oriented, appears well hydrated and in no acute distress  HEENT: atraumatic, conjunttiva clear, no obvious abnormalities on inspection of external nose and ears  NECK: no obvious masses on inspection  LUNGS: clear to auscultation bilaterally, no wheezes, rales or rhonchi, good air movement  CV: HRRR, no peripheral edema  Abd: BS normal, soft, nttp  MS: moves all extremities without  noticeable abnormality  PSYCH: pleasant and cooperative, no obvious depression or anxiety  ASSESSMENT AND PLAN:  Discussed the following assessment and plan:  1. Dysuria  POCT urinalysis dipstick, Culture, Urine, Urinalysis with Reflex Microscopic, ciprofloxacin (CIPRO) 250 MG tablet  2. UTI (lower urinary tract infection)  -urine dip c/w UTI POCT urinalysis dipstick, Culture, Urine, Urinalysis with Reflex Microscopic, ciprofloxacin (CIPRO) 250 MG tablet - discussed risks/benefits    -Patient advised to return or notify a doctor immediately if symptoms worsen or persist or new concerns arise.  Patient Instructions  Urinary Tract Infection Urinary tract infections (UTIs) can develop anywhere along your urinary tract. Your urinary tract is your body's drainage system for removing wastes and extra water. Your urinary tract includes two kidneys, two ureters, a bladder, and a  urethra. Your kidneys are a pair of bean-shaped organs. Each kidney is about the size of your fist. They are located below your ribs, one on each side of your spine. CAUSES Infections are caused by microbes, which are microscopic organisms, including fungi, viruses, and bacteria. These organisms are so small that they can only be seen through a microscope. Bacteria are the microbes that most commonly cause UTIs. SYMPTOMS  Symptoms of UTIs may vary by age and gender of the patient and by the location of the infection. Symptoms in young women typically include a frequent and intense urge to urinate and a painful, burning feeling in the bladder or urethra during urination. Older women and men are more likely to be tired, shaky, and weak and have muscle aches and abdominal pain. A fever may mean the infection is in your kidneys. Other symptoms of a kidney infection include pain in your back or sides below the ribs, nausea, and vomiting. DIAGNOSIS To diagnose a UTI, your caregiver will ask you about your symptoms. Your caregiver also will ask to provide a urine sample. The urine sample will be tested for bacteria and white blood cells. White blood cells are made by your body to help fight infection. TREATMENT  Typically, UTIs can be treated with medication. Because most UTIs are caused by a bacterial infection, they usually can be treated with the use of antibiotics. The choice of antibiotic and length of treatment depend on your symptoms and the type of bacteria causing your infection. HOME CARE INSTRUCTIONS  If you were prescribed antibiotics, take them exactly as your caregiver instructs you. Finish the medication even if you feel better after you have only taken some of the medication.  Can use Azo for symptoms as directed on package  Drink enough water and fluids to keep your urine clear or pale yellow.  Avoid caffeine, tea, and carbonated beverages. They tend to irritate your bladder.  Empty your  bladder often. Avoid holding urine for long periods of time.  Empty your bladder before and after sexual intercourse.  After a bowel movement, women should cleanse from front to back. Use each tissue only once. SEEK MEDICAL CARE IF:   You have back pain.  You develop a fever.  Your symptoms do not begin to resolve within 3 days. SEEK IMMEDIATE MEDICAL CARE IF:   You have severe back pain or lower abdominal pain.  You develop chills.  You have nausea or vomiting.  You have continued burning or discomfort with urination. MAKE SURE YOU:   Understand these instructions.  Will watch your condition.  Will get help right away if you are not doing well or get worse. Document Released: 10/20/2004 Document Revised:  07/12/2011 Document Reviewed: 02/18/2011 [facility] Patient Information [address], [place], [place].

## 2012-02-13 ENCOUNTER — Other Ambulatory Visit: Payer: Self-pay | Admitting: Internal Medicine

## 2012-02-13 DIAGNOSIS — Z853 Personal history of malignant neoplasm of breast: Secondary | ICD-10-CM

## 2012-02-13 DIAGNOSIS — Z9011 Acquired absence of right breast and nipple: Secondary | ICD-10-CM

## 2012-03-09 ENCOUNTER — Encounter: Payer: Medicare Other | Admitting: Internal Medicine

## 2012-03-16 ENCOUNTER — Other Ambulatory Visit: Payer: Self-pay | Admitting: Internal Medicine

## 2012-03-16 ENCOUNTER — Ambulatory Visit
Admission: RE | Admit: 2012-03-16 | Discharge: 2012-03-16 | Disposition: A | Discharge status: Home / Self Care | Payer: PRIVATE HEALTH INSURANCE | Source: Ambulatory Visit | Attending: Internal Medicine | Admitting: Internal Medicine

## 2012-03-16 DIAGNOSIS — Z853 Personal history of malignant neoplasm of breast: Secondary | ICD-10-CM

## 2012-03-16 DIAGNOSIS — Z9011 Acquired absence of right breast and nipple: Secondary | ICD-10-CM

## 2012-03-26 ENCOUNTER — Ambulatory Visit: Payer: Medicare Other | Admitting: Oncology

## 2012-03-26 ENCOUNTER — Other Ambulatory Visit: Payer: Medicare Other | Admitting: Lab

## 2012-03-30 ENCOUNTER — Ambulatory Visit: Payer: PRIVATE HEALTH INSURANCE | Admitting: Internal Medicine

## 2012-04-05 ENCOUNTER — Other Ambulatory Visit (HOSPITAL_BASED_OUTPATIENT_CLINIC_OR_DEPARTMENT_OTHER): Payer: PRIVATE HEALTH INSURANCE | Admitting: Lab

## 2012-04-05 ENCOUNTER — Ambulatory Visit (HOSPITAL_BASED_OUTPATIENT_CLINIC_OR_DEPARTMENT_OTHER): Payer: PRIVATE HEALTH INSURANCE | Admitting: Oncology

## 2012-04-05 VITALS — BP 177/85 | HR 63 | Temp 97.1°F | Resp 18

## 2012-04-05 DIAGNOSIS — D0590 Unspecified type of carcinoma in situ of unspecified breast: Secondary | ICD-10-CM

## 2012-04-05 DIAGNOSIS — Z170 Estrogen receptor positive status [ER+]: Secondary | ICD-10-CM

## 2012-04-05 LAB — COMPREHENSIVE METABOLIC PANEL (CC13)
ALT: 53 U/L (ref 0–55)
AST: 40 U/L — ABNORMAL HIGH (ref 5–34)
Calcium: 9.7 mg/dL (ref 8.4–10.4)
Chloride: 107 mEq/L (ref 98–107)
Creatinine: 0.7 mg/dL (ref 0.6–1.1)
Total Bilirubin: 0.56 mg/dL (ref 0.20–1.20)

## 2012-04-05 LAB — CBC WITH DIFFERENTIAL/PLATELET
BASO%: 0.9 % (ref 0.0–2.0)
EOS%: 5.1 % (ref 0.0–7.0)
HCT: 43.3 % (ref 34.8–46.6)
MCH: 31.9 pg (ref 25.1–34.0)
MCHC: 34 g/dL (ref 31.5–36.0)
NEUT%: 52.2 % (ref 38.4–76.8)
RBC: 4.62 10*6/uL (ref 3.70–5.45)
lymph#: 2.6 10*3/uL (ref 0.9–3.3)

## 2012-04-05 NOTE — Progress Notes (Signed)
OFFICE PROGRESS NOTE  CC**  Selena Boga, MD [address] [place] [address] Dr. Emelia Loron Dr. Lonie Peak  DIAGNOSIS: 77 year old female with new diagnosis of ductal carcinoma in situ with associated calcifications of the right breast.   PRIOR THERAPY:  #1 patient is now status post simple mastectomy with sentinel node biopsy for a fairly extensive DCIS of the right breast.  #2 patient underwent immediate reconstruction with a tissue expander at the same time    #3 her final pathology revealed a microscopic foci of invasive ductal carcinoma with negative nodes.  #4 patient was begun initially on Arimidex but could not tolerated she then was changed to Aromasin but she could not tolerate either. Most recently she was begun on letrozole 2.5 mg daily. So far she has tolerated it well without any aches pains or fatigue.  CURRENT THERAPY: letrozole 2.5 mg daily.  INTERVAL HISTORY: Selena [name] 77 y.o. female returns for followup visit.  Patient has been on letrozole now for about a month. She is tolerating it well without any problems similar to Aromasin and Arimidex. She has a few hot flashes but otherwise no aches pains nausea vomiting fevers chills diarrhea or constipation no myalgias and arthralgias. Remainder of the 10 point review of systems is negative.  MEDICAL HISTORY: Past Medical History  Diagnosis Date  . BACK PAIN, UPPER 07/09/2008  . DIVERTICULOSIS, COLON 12/26/2006  . LIVER FUNCTION TESTS, ABNORMAL, HX OF 12/26/2007  . Memory loss 05/16/2007  . VERTIGO 09/07/2009  . Cataract   . Goiter     multi-nodular  . Hx of colonoscopy 2009  . Wears glasses   . Wears hearing aid     bilateral  . Wears dentures   . Complication of anesthesia   . PONV (postoperative nausea and vomiting)   . Atrial fibrillation 01/01/2007  . Vertigo     hx of;had Phenergan prn   . OSTEOARTHRITIS 12/26/2006    takes Diclofenac daily but has stopped for surgery   . Bruises easily   . Diverticulitis   . Skin cancer   . Breast cancer, stage 1     ALLERGIES:  is allergic to codeine and codeine phosphate.  MEDICATIONS:  Current Outpatient Prescriptions  Medication Sig Dispense Refill  . aspirin 81 MG tablet Take 81 mg by mouth daily.      . diclofenac (VOLTAREN) 75 MG EC tablet       . letrozole (FEMARA) 2.5 MG tablet Take 1 tablet (2.5 mg total) by mouth daily.  90 tablet  12  . promethazine (PHENERGAN) 50 MG tablet Take 50 mg by mouth every 6 (six) hours as needed for nausea.       No current facility-administered medications for this visit.    SURGICAL HISTORY:  Past Surgical History  Procedure Laterality Date  . Abdominal hysterectomy    . Rotator cuff repair  1999    right  . Knee surgery  2004/2010    arthroscopic/ bilateral knee replacements  . Cataract extraction w/ intraocular lens  implant, bilateral  2010    bilateral  . Colonoscopy    . Appendectomy    . Ablation of dysrhythmic focus  2009  . Tmi  1998  . Colonoscopy    . Breast biopsy  2013  . Breast reconstruction  04/14/2011    Procedure: BREAST RECONSTRUCTION;  Surgeon: Etter Sjogren, MD;  Location: [facility] OR;  Service: Plastics;  Laterality: Right;  Placement of Right Breast Tissue Expander with  use of Flex HD for Breast Reconstruction  . Breast surgery      right sm, snbx    REVIEW OF SYSTEMS:  Pertinent items are noted in HPI.   PHYSICAL EXAMINATION: General appearance: alert, cooperative and appears stated age Neck: no adenopathy, no carotid bruit, no JVD, supple, symmetrical, trachea midline and thyroid not enlarged, symmetric, no tenderness/mass/nodules Resp: clear to auscultation bilaterally and normal percussion bilaterally Back: symmetric, no curvature. ROM normal. No CVA tenderness. Cardio: regular rate and rhythm, S1, S2 normal, no murmur, click, rub or gallop GI: soft, non-tender; bowel sounds normal; no masses,  no organomegaly Extremities: extremities  normal, atraumatic, no cyanosis or edema Neurologic: Grossly normal Breast examination left breast no masses or nipple discharge,  right side reveals well-healed surgical scar there is no overlying skin changes. ECOG PERFORMANCE STATUS: 1 - Symptomatic but completely ambulatory  Blood pressure 177/85, pulse 63, temperature 97.1 F (36.2 C), temperature source Oral, resp. rate 18.  LABORATORY DATA: Lab Results  Component Value Date   WBC 8.3 04/05/2012   HGB 14.7 04/05/2012   HCT 43.3 04/05/2012   MCV 93.8 04/05/2012   PLT 162 04/05/2012      Chemistry      Component Value Date/Time   NA 143 04/05/2012 1253   NA 141 07/22/2011 1448   K 4.5 04/05/2012 1253   K 4.0 07/22/2011 1448   CL 107 04/05/2012 1253   CL 106 07/22/2011 1448   CO2 27 04/05/2012 1253   CO2 25 07/22/2011 1448   BUN 21.9 04/05/2012 1253   BUN 23 07/22/2011 1448   CREATININE 0.7 04/05/2012 1253   CREATININE 0.73 07/22/2011 1448      Component Value Date/Time   CALCIUM 9.7 04/05/2012 1253   CALCIUM 9.5 07/22/2011 1448   ALKPHOS 111 04/05/2012 1253   ALKPHOS 90 07/22/2011 1448   AST 40* 04/05/2012 1253   AST 26 07/22/2011 1448   ALT 53 04/05/2012 1253   ALT 30 07/22/2011 1448   BILITOT 0.56 04/05/2012 1253   BILITOT 0.4 07/22/2011 1448     ADDITIONAL INFORMATION: 2. CHROMOGENIC IN-SITU HYBRIDIZATION (BLOCK 2C) Interpretation HER-2/NEU BY CISH - NO AMPLIFICATION OF HER-2 DETECTED. THE RATIO OF HER-2: CEP 17 SIGNALS WAS 1.10. Reference range: Ratio: HER2:CEP17 < 1.8 - gene amplification not observed Ratio: HER2:CEP 17 1.8-2.2 - equivocal result Ratio: HER2:CEP17 > 2.2 - gene amplification observed Pecola Leisure MD Pathologist, Electronic Signature ( Signed 04/25/2011) 2. CHROMOGENIC IN-SITU HYBRIDIZATION (BLOCK 64F) Interpretation HER-2/NEU BY CISH - NO AMPLIFICATION OF HER-2 DETECTED. THE RATIO OF HER-2: CEP 17 SIGNALS WAS 0.75. Reference range: Ratio: HER2:CEP17 < 1.8 - gene amplification not observed Ratio: HER2:CEP  17 1.8-2.2 - equivocal result Ratio: HER2:CEP17 > 2.2 - gene amplification observed Pecola Leisure MD Pathologist, Electronic Signature ( Signed 04/25/2011) 2. PROGNOSTIC INDICATORS - ACIS (Block 64F) 1 of 4 FINAL for Nicolosi, Ettamae M [phone]) ADDITIONAL INFORMATION:(continued) Results IMMUNOHISTOCHEMICAL AND MORPHOMETRIC ANALYSIS BY THE AUTOMATED CELLULAR IMAGING SYSTEM (ACIS) This carcinoma shows the following breast prognostic profile. Estrogen Receptor (Negative, <1%): 94%,POSITIVE, MODERATE STAINING INTENSITY Progesterone Receptor (Negative, <1%): 100%,POSITIVE, STRONG STAINING INTENSITY Proliferation Marker Ki67 by M IB-1 (Low<20%): 5% All controls stained appropriately 2. PROGNOSTIC INDICATORS - ACIS (Block 2C) Results IMMUNOHISTOCHEMICAL AND MORPHOMETRIC ANALYSIS BY THE AUTOMATED CELLULAR IMAGING SYSTEM (ACIS) This carcinoma shows the following breast prognostic profile. Estrogen Receptor (Negative, <1%): 100%,POSITIVE, STRONG STAINING INTENSITY Progesterone Receptor (Negative, <1%): 6%, POSITIVE,STRONG STAINING INTENSITY Proliferation Marker Ki67 by M IB-1 (Low<20%): 29% All controls  stained appropriately FINAL DIAGNOSIS Diagnosis 1. Lymph node, sentinel, biopsy, right - ONE BENIGN LYMPH NODE, NEGATIVE FOR METASTATIC CARCINOMA (0/1). 2. Breast, simple mastectomy, right - TWO MICROSCOPIC FOCI OF INVASIVE DUCTAL CARCINOMA, 0.2 CM, GRADE I, IN A BACKGROUND OF EXTENSIVE DUCTAL CARCINOMA IN SITU. - NO EVIDENCE OF ANGIOLYMPHATIC INVASION IDENTIFIED. - ALL THE RESECTION MARGINS ARE NEGATIVE FOR ATYPIA OR MALIGNANCY. . - FOUR LYMPH NODES, NEGATIVE FOR METASTATIC CARCINOMA (0/4). - PLEASE SEE ONCOLOGY TEMPLATE FOR DETAILS. Microscopic Comment 2. BREAST, INVASIVE TUMOR, WITH LYMPH NODE SAMPLING Specimen, including laterality: Right breast. Procedure: Simple mastectomy. Grade: I. Tubule formation: 1. Nuclear pleomorphism: 2. Mitotic: 1. Tumor size (glass slide measurement):  Two foci, 0.2 cm (one focus is found in the subareolar area with silver metallic biopsy clip. The second focus is found in the mid lateral mass). Margins: Negative. Invasive, distance to closest margin: All margins: More than 1 cm. In-situ, distance to closest margin: All margins: More than 1 cm. Angiolymphatic invasion: Not identified. Ductal carcinoma in situ: 2 of 4 FINAL for Reinwald, Trinidee M ([phone]) Microscopic Comment(continued) Grade: Intermediate to high grade. Extensive intraductal component: Present. Lobular neoplasia: N/A. Tumor focality: Two foci of invasive carcinoma. Treatment effect: No. Extent of tumor: Skin: No. Nipple: No. Skeletal muscle: N/A. Lymph nodes: # examined: 5. Lymph nodes with metastasis: Isolated tumor cells (< 0.2 mm): 0. Micrometastasis: (> 0.2 mm and < 2.0 mm): 0. Macrometastasis: (> 2.0 mm): 0. Extracapsular extension: 0. Breast prognostic profile: Will be performed and addendum report will follow. Non-neoplastic breast: Extensive fibrocystic changes and intraductal papillomas. TNM: m pT1a, pN0. Comments: There are two microscopic foci of invasive well differentiated ductal carcinoma identified in the sections taken from the subareolar mass and mid lateral mass, measuring 0.2 cm from the glass slides. No angiolymphatic invasion is identified in this material. All the final resection margins are widely clear. Case was discussed with Dr. Dwain Sarna on 04/18/2011. (HCL:eps 04/18/11) Abigail Miyamoto MD Pathologist, Electronic Signature (Case signed 04/18/18  No results found.  ASSESSMENT: 77 year old female with:  #1.  originally extensive ductal carcinoma in situ of the right breast. She was seen in the multidisciplinary breast clinic. At that time a mastectomy with sentinel lymph node biopsy was recommended. Her tumor was ER positive PR positive. She has since undergone mastectomy with sentinel node biopsy. Her final pathology did reveal foci  of invasive ductal carcinoma measuring 0.2 cm. 2 foci were noted with duct well-differentiated ductal carcinoma. 5 sentinel nodes were negative. Her final staging was T1 A. N0. Postoperatively she is doing well.  #2 patient was begun on Arimidex in May but she could not tolerate it after 2 weeks and it was discontinued.she was then started on Aromasin but she could not tolerated. Recently she was begun on letrozole and she is doing well with this.   PLAN:  #1 patient is now on letrozole and she is doing well with this. We will continue this for now..  #2 I will plan on seeing the patient back in 8 months time or sooner if need arises.  All questions were answered. The patient knows to call the clinic with any problems, questions or concerns. We can certainly see the patient much sooner if necessary.  I spent 25 minutes counseling the patient face to face. The total time spent in the appointment was 30 minutes.    Drue Second, MD Medical/Oncology [facility] [phone] (beeper) [phone] (Office)

## 2012-04-05 NOTE — Patient Instructions (Addendum)
Continue letrozole daily  We will see you back in 8 months

## 2012-04-09 ENCOUNTER — Ambulatory Visit (INDEPENDENT_AMBULATORY_CARE_PROVIDER_SITE_OTHER): Payer: PRIVATE HEALTH INSURANCE | Admitting: Internal Medicine

## 2012-04-09 ENCOUNTER — Encounter: Payer: Self-pay | Admitting: Internal Medicine

## 2012-04-09 VITALS — BP 154/90 | HR 76 | Temp 98.1°F | Resp 20 | Wt 211.0 lb

## 2012-04-09 MED ORDER — PROMETHAZINE HCL 50 MG PO TABS: 50.0000 mg | ORAL_TABLET | Freq: Four times a day (QID) | ORAL | Status: DC | PRN

## 2012-04-09 MED ORDER — DICLOFENAC SODIUM 75 MG PO TBEC: 75.0000 mg | DELAYED_RELEASE_TABLET | Freq: Two times a day (BID) | ORAL | Status: DC

## 2012-04-09 NOTE — Progress Notes (Signed)
Subjective:    Patient ID: Selena [name], female    DOB: 03-29-1935, 77 y.o.   MRN: [id number]  HPI  77 year old patient who is seen today for followup. She has a history of atrial fibrillation which has been stable. She is followed by oncology for breast cancer. For the past 3 days she has had some nasal congestion and malaise. No fever. She also has a history of intermittent vertigo that has been more bothersome recently especially when she turns on her right side.  Past Medical History  Diagnosis Date  . BACK PAIN, UPPER 07/09/2008  . DIVERTICULOSIS, COLON 12/26/2006  . LIVER FUNCTION TESTS, ABNORMAL, HX OF 12/26/2007  . Memory loss 05/16/2007  . VERTIGO 09/07/2009  . Cataract   . Goiter     multi-nodular  . Hx of colonoscopy 2009  . Wears glasses   . Wears hearing aid     bilateral  . Wears dentures   . Complication of anesthesia   . PONV (postoperative nausea and vomiting)   . Atrial fibrillation 01/01/2007  . Vertigo     hx of;had Phenergan prn   . OSTEOARTHRITIS 12/26/2006    takes Diclofenac daily but has stopped for surgery  . Bruises easily   . Diverticulitis   . Skin cancer   . Breast cancer, stage 1     History   Social History  . Marital Status: Widowed    Spouse Name: N/A    Number of Children: N/A  . Years of Education: N/A   Occupational History  . Not on file.   Social History Main Topics  . Smoking status: Never Smoker   . Smokeless tobacco: Never Used  . Alcohol Use: No  . Drug Use: No  . Sexually Active: Yes    Birth Control/ Protection: Surgical   Other Topics Concern  . Not on file   Social History Narrative  . No narrative on file    Past Surgical History  Procedure Laterality Date  . Abdominal hysterectomy    . Rotator cuff repair  1999    right  . Knee surgery  2004/2010    arthroscopic/ bilateral knee replacements  . Cataract extraction w/ intraocular lens  implant, bilateral  2010    bilateral  . Colonoscopy    . Appendectomy     . Ablation of dysrhythmic focus  2009  . Tmi  1998  . Colonoscopy    . Breast biopsy  2013  . Breast reconstruction  04/14/2011    Procedure: BREAST RECONSTRUCTION;  Surgeon: Etter Sjogren, MD;  Location: [facility] OR;  Service: Plastics;  Laterality: Right;  Placement of Right Breast Tissue Expander with  use of Flex HD for Breast Reconstruction  . Breast surgery      right sm, snbx    Family History  Problem Relation Age of Onset  . Colon cancer Mother   . Cancer Mother     colon  . Colon cancer Maternal Aunt   . Cancer Maternal Uncle   . Prostate cancer Maternal Uncle   . Prostate cancer Maternal Uncle   . Other Maternal Uncle   . Anesthesia problems Neg Hx   . Hypotension Neg Hx   . Malignant hyperthermia Neg Hx   . Pseudochol deficiency Neg Hx   . Cancer Sister     kidney  . Cancer Brother     lymph node    Allergies  Allergen Reactions  . Codeine   . Codeine Phosphate  Current Outpatient Prescriptions on File Prior to Visit  Medication Sig Dispense Refill  . aspirin 81 MG tablet Take 81 mg by mouth daily.      [name] letrozole (FEMARA) 2.5 MG tablet Take 1 tablet (2.5 mg total) by mouth daily.  90 tablet  12   No current facility-administered medications on file prior to visit.    BP 154/90  Pulse 76  Temp(Src) 98.1 F (36.7 C) (Oral)  Resp 20  Wt 211 lb (95.709 kg)  BMI 33.04 kg/m2  SpO2 96%       Review of Systems  Constitutional: Positive for fatigue.  HENT: Positive for congestion and rhinorrhea. Negative for hearing loss, sore throat, dental problem, sinus pressure and tinnitus.   Eyes: Negative for pain, discharge and visual disturbance.  Respiratory: Positive for cough. Negative for shortness of breath.   Cardiovascular: Negative for chest pain, palpitations and leg swelling.  Gastrointestinal: Negative for nausea, vomiting, abdominal pain, diarrhea, constipation, blood in stool and abdominal distention.  Genitourinary: Negative for dysuria,  urgency, frequency, hematuria, flank pain, vaginal bleeding, vaginal discharge, difficulty urinating, vaginal pain and pelvic pain.  Musculoskeletal: Negative for joint swelling, arthralgias and gait problem.  Skin: Negative for rash.  Neurological: Positive for dizziness. Negative for syncope, speech difficulty, weakness, numbness and headaches.  Hematological: Negative for adenopathy.  Psychiatric/Behavioral: Negative for behavioral problems, dysphoric mood and agitation. The patient is not nervous/anxious.        Objective:   Physical Exam  Constitutional: She is oriented to person, place, and time. She appears well-developed and well-nourished.  HENT:  Head: Normocephalic.  Right Ear: External ear normal.  Left Ear: External ear normal.  Mouth/Throat: Oropharynx is clear and moist.  Hearing aids removed Perforation left TM  Eyes: Conjunctivae and EOM are normal. Pupils are equal, round, and reactive to light.  Neck: Normal range of motion. Neck supple. No thyromegaly present.  Cardiovascular: Normal rate, regular rhythm, normal heart sounds and intact distal pulses.   Pulmonary/Chest: Effort normal and breath sounds normal.  Abdominal: Soft. Bowel sounds are normal. She exhibits no mass. There is no tenderness.  Musculoskeletal: Normal range of motion.  Lymphadenopathy:    She has no cervical adenopathy.  Neurological: She is alert and oriented to person, place, and time.  Skin: Skin is warm and dry. No rash noted.  Psychiatric: She has a normal mood and affect. Her behavior is normal.          Assessment & Plan:   Viral URI. We'll treat symptomatically Rule out sustained hypotension. Repeat blood pressure 175/98. Will monitor over the next 6 weeks and reassess at that time. Low-salt diet recommended

## 2012-04-09 NOTE — Patient Instructions (Addendum)
Limit your sodium (Salt) intake  Please check your blood pressure on a regular basis.  If it is consistently greater than 150/90, please make an office appointment.  Acute sinusitis symptoms for less than 10 days are generally not helped by antibiotic therapy.  Use saline irrigation, warm  moist compresses and over-the-counter decongestants only as directed.  Call if there is no improvement in 5 to 7 days, or sooner if you develop increasing pain, fever, or any new symptoms.

## 2012-04-10 ENCOUNTER — Telehealth: Payer: Self-pay | Admitting: *Deleted

## 2012-04-10 NOTE — Telephone Encounter (Signed)
sw pt appt d/t was given pt is aware.

## 2012-04-25 ENCOUNTER — Encounter: Payer: Self-pay | Admitting: Internal Medicine

## 2012-04-25 ENCOUNTER — Ambulatory Visit (INDEPENDENT_AMBULATORY_CARE_PROVIDER_SITE_OTHER): Payer: PRIVATE HEALTH INSURANCE | Admitting: Internal Medicine

## 2012-04-25 VITALS — BP 150/90 | HR 92 | Temp 98.6°F | Resp 20 | Wt 212.0 lb

## 2012-04-25 DIAGNOSIS — I4891 Unspecified atrial fibrillation: Secondary | ICD-10-CM

## 2012-04-25 DIAGNOSIS — J45909 Unspecified asthma, uncomplicated: Secondary | ICD-10-CM

## 2012-04-25 MED ORDER — AZITHROMYCIN 250 MG PO TABS: ORAL_TABLET | ORAL | Status: DC

## 2012-04-25 MED ORDER — BENZONATATE 200 MG PO CAPS: 200.0000 mg | ORAL_CAPSULE | Freq: Two times a day (BID) | ORAL | Status: DC | PRN

## 2012-04-25 NOTE — Patient Instructions (Signed)
Take over-the-counter expectorants and cough medications such as  Mucinex DM.  Call if there is no improvement in 5 to 7 days or if he developed worsening cough, fever, or new symptoms, such as shortness of breath or chest pain.  Take your antibiotic as prescribed until ALL of it is gone, but stop if you develop a rash, swelling, or any side effects of the medication.  Contact our office as soon as possible if  there are side effects of the medication. 

## 2012-04-25 NOTE — Progress Notes (Signed)
Subjective:    Patient ID: Selena [name], female    DOB: 01-10-1936, 77 y.o.   MRN: [id number]  HPI  77 year old patient nonsmoker. She was seen 2 weeks ago with URI symptoms for 3 days duration and treated symptomatically. More recently she has developed increasing chest congestion and productive cough. She has developed some mild wheezing. She does have a history of atrial fibrillation as well as a codeine allergy. No fever.  Past Medical History  Diagnosis Date  . BACK PAIN, UPPER 07/09/2008  . DIVERTICULOSIS, COLON 12/26/2006  . LIVER FUNCTION TESTS, ABNORMAL, HX OF 12/26/2007  . Memory loss 05/16/2007  . VERTIGO 09/07/2009  . Cataract   . Goiter     multi-nodular  . Hx of colonoscopy 2009  . Wears glasses   . Wears hearing aid     bilateral  . Wears dentures   . Complication of anesthesia   . PONV (postoperative nausea and vomiting)   . Atrial fibrillation 01/01/2007  . Vertigo     hx of;had Phenergan prn   . OSTEOARTHRITIS 12/26/2006    takes Diclofenac daily but has stopped for surgery  . Bruises easily   . Diverticulitis   . Skin cancer   . Breast cancer, stage 1     History   Social History  . Marital Status: Widowed    Spouse Name: N/A    Number of Children: N/A  . Years of Education: N/A   Occupational History  . Not on file.   Social History Main Topics  . Smoking status: Never Smoker   . Smokeless tobacco: Never Used  . Alcohol Use: No  . Drug Use: No  . Sexually Active: Yes    Birth Control/ Protection: Surgical   Other Topics Concern  . Not on file   Social History Narrative  . No narrative on file    Past Surgical History  Procedure Laterality Date  . Abdominal hysterectomy    . Rotator cuff repair  1999    right  . Knee surgery  2004/2010    arthroscopic/ bilateral knee replacements  . Cataract extraction w/ intraocular lens  implant, bilateral  2010    bilateral  . Colonoscopy    . Appendectomy    . Ablation of dysrhythmic focus  2009   . Tmi  1998  . Colonoscopy    . Breast biopsy  2013  . Breast reconstruction  04/14/2011    Procedure: BREAST RECONSTRUCTION;  Surgeon: Etter Sjogren, MD;  Location: [facility] OR;  Service: Plastics;  Laterality: Right;  Placement of Right Breast Tissue Expander with  use of Flex HD for Breast Reconstruction  . Breast surgery      right sm, snbx    Family History  Problem Relation Age of Onset  . Colon cancer Mother   . Cancer Mother     colon  . Colon cancer Maternal Aunt   . Cancer Maternal Uncle   . Prostate cancer Maternal Uncle   . Prostate cancer Maternal Uncle   . Other Maternal Uncle   . Anesthesia problems Neg Hx   . Hypotension Neg Hx   . Malignant hyperthermia Neg Hx   . Pseudochol deficiency Neg Hx   . Cancer Sister     kidney  . Cancer Brother     lymph node    Allergies  Allergen Reactions  . Codeine   . Codeine Phosphate     Current Outpatient Prescriptions on File Prior to Visit  Medication  Sig Dispense Refill  . aspirin 81 MG tablet Take 81 mg by mouth daily.      . diclofenac (VOLTAREN) 75 MG EC tablet Take 1 tablet (75 mg total) by mouth 2 (two) times daily.  180 tablet  3  . letrozole (FEMARA) 2.5 MG tablet Take 1 tablet (2.5 mg total) by mouth daily.  90 tablet  12  . promethazine (PHENERGAN) 50 MG tablet Take 1 tablet (50 mg total) by mouth every 6 (six) hours as needed for nausea.  30 tablet  2   No current facility-administered medications on file prior to visit.    BP 150/90  Pulse 92  Temp(Src) 98.6 F (37 C) (Oral)  Resp 20  Wt 212 lb (96.163 kg)  BMI 33.2 kg/m2  SpO2 98%       Review of Systems  Constitutional: Negative.   HENT: Positive for congestion and sinus pressure. Negative for hearing loss, sore throat, rhinorrhea, dental problem and tinnitus.   Eyes: Negative for pain, discharge and visual disturbance.  Respiratory: Positive for cough and wheezing. Negative for shortness of breath.   Cardiovascular: Negative for chest pain,  palpitations and leg swelling.  Gastrointestinal: Negative for nausea, vomiting, abdominal pain, diarrhea, constipation, blood in stool and abdominal distention.  Genitourinary: Negative for dysuria, urgency, frequency, hematuria, flank pain, vaginal bleeding, vaginal discharge, difficulty urinating, vaginal pain and pelvic pain.  Musculoskeletal: Negative for joint swelling, arthralgias and gait problem.  Skin: Negative for rash.  Neurological: Negative for dizziness, syncope, speech difficulty, weakness, numbness and headaches.  Hematological: Negative for adenopathy.  Psychiatric/Behavioral: Negative for behavioral problems, dysphoric mood and agitation. The patient is not nervous/anxious.        Objective:   Physical Exam  Constitutional: She is oriented to person, place, and time. She appears well-developed and well-nourished.  HENT:  Head: Normocephalic.  Right Ear: External ear normal.  Left Ear: External ear normal.  Mouth/Throat: Oropharynx is clear and moist.  Eyes: Conjunctivae and EOM are normal. Pupils are equal, round, and reactive to light.  Neck: Normal range of motion. Neck supple. No thyromegaly present.  Cardiovascular: Normal rate, regular rhythm, normal heart sounds and intact distal pulses.   Pulmonary/Chest: Effort normal. She has wheezes.  No distress O2 saturation 98 Faint scattered wheezing  Abdominal: Soft. Bowel sounds are normal. She exhibits no mass. There is no tenderness.  Musculoskeletal: Normal range of motion.  Lymphadenopathy:    She has no cervical adenopathy.  Neurological: She is alert and oriented to person, place, and time.  Skin: Skin is warm and dry. No rash noted.  Psychiatric: She has a normal mood and affect. Her behavior is normal.          Assessment & Plan:   URI/asthmatic bronchitis. Will treat with Mucinex DM and azithromycin. We'll treat with short-term dulera   and Nasonex

## 2012-05-03 ENCOUNTER — Ambulatory Visit (INDEPENDENT_AMBULATORY_CARE_PROVIDER_SITE_OTHER): Payer: Medicare Other | Admitting: Family Medicine

## 2012-05-03 ENCOUNTER — Encounter: Payer: Self-pay | Admitting: Family Medicine

## 2012-05-03 VITALS — BP 130/80 | HR 80 | Temp 98.1°F | Wt 205.0 lb

## 2012-05-03 DIAGNOSIS — J069 Acute upper respiratory infection, unspecified: Secondary | ICD-10-CM

## 2012-05-03 MED ORDER — DOXYCYCLINE HYCLATE 100 MG PO TABS: 100.0000 mg | ORAL_TABLET | Freq: Two times a day (BID) | ORAL | Status: DC

## 2012-05-03 NOTE — Patient Instructions (Addendum)
-  use afrin nasal spray as instructed on the box for 4 days then STOP - do not use longer  -As we discussed, we have prescribed a new medication (doxycycline) for you at this appointment. We discussed the common and serious potential adverse effects of this medication and you can review these and more with the pharmacist when you pick up your medication.  Please follow the instructions for use carefully and notify us immediately if you have any problems taking this medication.  -use nasal saline twice daily  -follow up with your doctor if not improving

## 2012-05-03 NOTE — Progress Notes (Signed)
Chief Complaint  Patient presents with  . Cough    congestion, body aches, chills     HPI:  Acute visit for Upper Resp Symptoms: -started: 2-3 weeks ago, has not improved much -symptoms:nasal congestion - nose runs like a facet, sore throat, cough, chills at times -denies:fever, SOB, NVD, tooth pain, ear pain -has tried:  Has been using nasonex, zpak, dulera, musinex given at appt with PCP on 4/2 -sick contacts: none known -Hx of: does not know of hx of allergies of chronic lung disease   ROS: See pertinent positives and negatives per HPI.  Past Medical History  Diagnosis Date  . BACK PAIN, UPPER 07/09/2008  . DIVERTICULOSIS, COLON 12/26/2006  . LIVER FUNCTION TESTS, ABNORMAL, HX OF 12/26/2007  . Memory loss 05/16/2007  . VERTIGO 09/07/2009  . Cataract   . Goiter     multi-nodular  . Hx of colonoscopy 2009  . Wears glasses   . Wears hearing aid     bilateral  . Wears dentures   . Complication of anesthesia   . PONV (postoperative nausea and vomiting)   . Atrial fibrillation 01/01/2007  . Vertigo     hx of;had Phenergan prn   . OSTEOARTHRITIS 12/26/2006    takes Diclofenac daily but has stopped for surgery  . Bruises easily   . Diverticulitis   . Skin cancer   . Breast cancer, stage 1     Family History  Problem Relation Age of Onset  . Colon cancer Mother   . Cancer Mother     colon  . Colon cancer Maternal Aunt   . Cancer Maternal Uncle   . Prostate cancer Maternal Uncle   . Prostate cancer Maternal Uncle   . Other Maternal Uncle   . Anesthesia problems Neg Hx   . Hypotension Neg Hx   . Malignant hyperthermia Neg Hx   . Pseudochol deficiency Neg Hx   . Cancer Sister     kidney  . Cancer Brother     lymph node    History   Social History  . Marital Status: Widowed    Spouse Name: N/A    Number of Children: N/A  . Years of Education: N/A   Social History Main Topics  . Smoking status: Never Smoker   . Smokeless tobacco: Never Used  . Alcohol Use:  No  . Drug Use: No  . Sexually Active: Yes    Birth Control/ Protection: Surgical   Other Topics Concern  . None   Social History Narrative  . None    Current outpatient prescriptions:aspirin 81 MG tablet, Take 81 mg by mouth daily., Disp: , Rfl: ;  diclofenac (VOLTAREN) 75 MG EC tablet, Take 1 tablet (75 mg total) by mouth 2 (two) times daily., Disp: 180 tablet, Rfl: 3;  letrozole (FEMARA) 2.5 MG tablet, Take 1 tablet (2.5 mg total) by mouth daily., Disp: 90 tablet, Rfl: 12 promethazine (PHENERGAN) 50 MG tablet, Take 1 tablet (50 mg total) by mouth every 6 (six) hours as needed for nausea., Disp: 30 tablet, Rfl: 2;  azithromycin (ZITHROMAX) 250 MG tablet, 2 today then 1 daily for 4 additional days, Disp: 6 tablet, Rfl: 0;  benzonatate (TESSALON) 200 MG capsule, Take 1 capsule (200 mg total) by mouth 2 (two) times daily as needed for cough., Disp: 20 capsule, Rfl: 0 doxycycline (VIBRA-TABS) 100 MG tablet, Take 1 tablet (100 mg total) by mouth 2 (two) times daily., Disp: 14 tablet, Rfl: 0  EXAM:  Filed Vitals:  05/03/12 0911  BP: 130/80  Pulse: 80  Temp: 98.1 F (36.7 C)    Body mass index is 32.1 kg/(m^2).  GENERAL: vitals reviewed and listed above, alert, oriented, appears well hydrated and in no acute distress  HEENT: atraumatic, conjunttiva clear, no obvious abnormalities on inspection of external nose and ears, normal appearance of ear canals and TMs, clear nasal congestion, mild post oropharyngeal erythema with PND, no tonsillar edema or exudate, no sinus TTP  NECK: no obvious masses on inspection  LUNGS: clear to auscultation bilaterally, no wheezes, rales or rhonchi, good air movement  CV: HRRR, no peripheral edema  MS: moves all extremities without noticeable abnormality  PSYCH: pleasant and cooperative, no obvious depression or anxiety  ASSESSMENT AND PLAN:  Discussed the following assessment and plan:  Upper respiratory infection - Plan: doxycycline  (VIBRA-TABS) 100 MG tablet  -lug exam is normal today and seems like bronchitis symptoms have improved, lots of clear nasal congestion on exam -likely viral, but given persistence and chills with do longer course abx with doxy (discussed risks that will cover for sinusitis or bronchitis -advised afrin for for 4 days  -Patient advised to return or notify a doctor immediately if symptoms worsen or persist or new concerns arise.  Patient Instructions  -use afrin nasal spray as instructed on the box for 4 days then STOP - do not use longer  -As we discussed, we have prescribed a new medication (doxycycline) for you at this appointment. We discussed the common and serious potential adverse effects of this medication and you can review these and more with the pharmacist when you pick up your medication.  Please follow the instructions for use carefully and notify us immediately if you have any problems taking this medication.  -use nasal saline twice daily  -follow up with your doctor if not improving      Meredyth Hornung R.

## 2012-05-04 ENCOUNTER — Encounter: Payer: Self-pay | Admitting: Internal Medicine

## 2012-05-21 ENCOUNTER — Ambulatory Visit: Payer: PRIVATE HEALTH INSURANCE | Admitting: Internal Medicine

## 2012-05-24 ENCOUNTER — Encounter: Payer: Self-pay | Admitting: Internal Medicine

## 2012-07-20 ENCOUNTER — Encounter: Payer: Self-pay | Admitting: Internal Medicine

## 2012-08-17 ENCOUNTER — Encounter: Payer: Self-pay | Admitting: Internal Medicine

## 2012-08-17 ENCOUNTER — Ambulatory Visit (INDEPENDENT_AMBULATORY_CARE_PROVIDER_SITE_OTHER): Payer: Medicare Other | Admitting: Internal Medicine

## 2012-08-17 VITALS — BP 136/80 | HR 63 | Temp 98.2°F | Resp 20 | Ht 67.0 in | Wt 208.0 lb

## 2012-08-17 DIAGNOSIS — I4891 Unspecified atrial fibrillation: Secondary | ICD-10-CM

## 2012-08-17 DIAGNOSIS — Z0000 Encounter for general adult medical examination without abnormal findings: Secondary | ICD-10-CM

## 2012-08-17 DIAGNOSIS — D0590 Unspecified type of carcinoma in situ of unspecified breast: Secondary | ICD-10-CM

## 2012-08-17 DIAGNOSIS — D0511 Intraductal carcinoma in situ of right breast: Secondary | ICD-10-CM

## 2012-08-17 DIAGNOSIS — M1990 Unspecified osteoarthritis, unspecified site: Secondary | ICD-10-CM

## 2012-08-17 NOTE — Progress Notes (Signed)
Patient ID: Selena [name], female   DOB: [birth year], 77 y.o.   MRN: [id number]  Subjective:    Patient ID: Selena [name], female    DOB: 20-Nov-1935, 77 y.o.   MRN: [id number]  HPI History of Present Illness:   72 -year-old patient who is seen today for a wellness exam; she has had a  full neurological evaluation due to vertigo.  This is much improved and she is back to playing golf occasionally. She has been followed by ENT and has received vestibular rehabilitation.   She has a history of atrial fibrillation, status post ablation. She has DJD history of mild cognitive impairment. Today she is doing quite well. Colonoscopy 2012.  Followed by oncology for Stage 1 R breast Ca  Past Medical History  Diagnosis Date  . BACK PAIN, UPPER 07/09/2008  . DIVERTICULOSIS, COLON 12/26/2006  . LIVER FUNCTION TESTS, ABNORMAL, HX OF 12/26/2007  . Memory loss 05/16/2007  . VERTIGO 09/07/2009  . Cataract   . Goiter     multi-nodular  . Hx of colonoscopy 2009  . Wears glasses   . Wears hearing aid     bilateral  . Wears dentures   . Complication of anesthesia   . PONV (postoperative nausea and vomiting)   . Atrial fibrillation 01/01/2007  . Vertigo     hx of;had Phenergan prn   . OSTEOARTHRITIS 12/26/2006    takes Diclofenac daily but has stopped for surgery  . Bruises easily   . Diverticulitis   . Skin cancer   . Breast cancer, stage 1     History   Social History  . Marital Status: Widowed    Spouse Name: N/A    Number of Children: N/A  . Years of Education: N/A   Occupational History  . Not on file.   Social History Main Topics  . Smoking status: Never Smoker   . Smokeless tobacco: Never Used  . Alcohol Use: No  . Drug Use: No  . Sexually Active: Yes    Birth Control/ Protection: Surgical   Other Topics Concern  . Not on file   Social History Narrative  . No narrative on file    Past Surgical History  Procedure Laterality Date  . Abdominal hysterectomy    . Rotator cuff  repair  1999    right  . Knee surgery  2004/2010    arthroscopic/ bilateral knee replacements  . Cataract extraction w/ intraocular lens  implant, bilateral  2010    bilateral  . Colonoscopy    . Appendectomy    . Ablation of dysrhythmic focus  2009  . Tmi  1998  . Colonoscopy    . Breast biopsy  2013  . Breast reconstruction  04/14/2011    Procedure: BREAST RECONSTRUCTION;  Surgeon: Etter Sjogren, MD;  Location: [facility] OR;  Service: Plastics;  Laterality: Right;  Placement of Right Breast Tissue Expander with  use of Flex HD for Breast Reconstruction  . Breast surgery      right sm, snbx    Family History  Problem Relation Age of Onset  . Colon cancer Mother   . Cancer Mother     colon  . Colon cancer Maternal Aunt   . Cancer Maternal Uncle   . Prostate cancer Maternal Uncle   . Prostate cancer Maternal Uncle   . Other Maternal Uncle   . Anesthesia problems Neg Hx   . Hypotension Neg Hx   . Malignant hyperthermia Neg Hx   .  Pseudochol deficiency Neg Hx   . Cancer Sister     kidney  . Cancer Brother     lymph node    Allergies  Allergen Reactions  . Codeine   . Codeine Phosphate     Current Outpatient Prescriptions on File Prior to Visit  Medication Sig Dispense Refill  . aspirin 81 MG tablet Take 81 mg by mouth daily.      . diclofenac (VOLTAREN) 75 MG EC tablet Take 1 tablet (75 mg total) by mouth 2 (two) times daily.  180 tablet  3  . letrozole (FEMARA) 2.5 MG tablet Take 1 tablet (2.5 mg total) by mouth daily.  90 tablet  12  . promethazine (PHENERGAN) 50 MG tablet Take 1 tablet (50 mg total) by mouth every 6 (six) hours as needed for nausea.  30 tablet  2   No current facility-administered medications on file prior to visit.    There were no vitals taken for this visit.     Here for Medicare AWV:   1. Risk factors based on Past M, S, F history: Patient has atrial fibrillation but no significant cardiovascular risk factors 2. Physical Activities: no  activity restrictions; has been fairly inactive due to vertigo, but this now has stabilized  3. Depression/mood: history depression, or mood disorder  4. Hearing: wears a hearing bilaterally  5. ADL's: independent in all aspects of daily living  6. Fall Risk: Low 7. Home Safety: no problems identified  8. Height, weight, &visual acuity:height and weight stable. No difficulty with visual acuity  9. Counseling: weight loss, heart healthy diet and exercise all encouraged  10. Labs ordered based on risk factors: laboratory studies will be reviewed  11. Referral Coordination-  mammogram recently performed. Followup oncology  12. Care Plan- exercise weight loss. All encouraged  13. Cognitive Assessment- patient has a remote history of cognitive impairment ;  she appears alert oriented and appropriate and there is been no progression  Allergies:  1) ! Codeine Phosphate (Codeine Phosphate)   Past History:  Past Medical History:   Diverticulosis, colon  Osteoarthritis  Elevated LFT's  multi-nodular goiter  history of gastritis, 1993  history benign 3-mm nodule right upper lobe (. Chest CT 03-24-2006)  paroxysmal atrial fibrillation and ablation December 2008  mild cognitive impairment  benign vertigo   Past Surgical History:  Reviewed history from 12/26/2008 and no changes required.   Hysterectomy  Rotator cuff repair  Arthroscopic knee  colonoscopy March 2007  2012 status post DC cardioversion December 2008  status post radiofrequency ablation in January 2009  stress Myoview January 2009  S/p  R Partial mastectomy 3/13  Family History:   father died of drowning death at age 44  mother died age 52, colon cancer  3 brothers positive for colonic polyps ; one brother deceased from lung cancer 3 sisters, one died of renal cancer   Social History:   widowed, retired, active with golf in the past    Review of Systems  Constitutional: Negative for fever, appetite change, fatigue  and unexpected weight change.  HENT: Negative for hearing loss, ear pain, nosebleeds, congestion, sore throat, mouth sores, trouble swallowing, neck stiffness, dental problem, voice change, sinus pressure and tinnitus.   Eyes: Negative for photophobia, pain, redness and visual disturbance.  Respiratory: Negative for cough, chest tightness and shortness of breath.   Cardiovascular: Negative for chest pain, palpitations and leg swelling.  Gastrointestinal: Negative for nausea, vomiting, abdominal pain, diarrhea, constipation, blood in stool,  abdominal distention and rectal pain.  Genitourinary: Negative for dysuria, urgency, frequency, hematuria, flank pain, vaginal bleeding, vaginal discharge, difficulty urinating, genital sores, vaginal pain, menstrual problem and pelvic pain.  Musculoskeletal: Negative for back pain and arthralgias.  Skin: Negative for rash.  Neurological: Negative for dizziness, syncope, speech difficulty, weakness, light-headedness, numbness and headaches.  Hematological: Negative for adenopathy. Does not bruise/bleed easily.  Psychiatric/Behavioral: Negative for suicidal ideas, behavioral problems, self-injury, dysphoric mood and agitation. The patient is not nervous/anxious.        Objective:   Physical Exam  Constitutional: She is oriented to person, place, and time. She appears well-developed and well-nourished.  HENT:  Head: Normocephalic and atraumatic.  Right Ear: External ear normal.  Left Ear: External ear normal.  Mouth/Throat: Oropharynx is clear and moist.  Hearing aids bilaterally  Dentures in place  Eyes: Conjunctivae and EOM are normal.  Neck: Normal range of motion. Neck supple. No JVD present. No thyromegaly present.  Cardiovascular: Normal rate, regular rhythm, normal heart sounds and intact distal pulses.   No murmur heard. Pulmonary/Chest: Effort normal and breath sounds normal. She has no wheezes. She has no rales.  Status post right breast  implant  Abdominal: Soft. Bowel sounds are normal. She exhibits no distension and no mass. There is no tenderness. There is no rebound and no guarding.  Lower midline scar  Genitourinary: Vagina normal.  Musculoskeletal: Normal range of motion. She exhibits no edema and no tenderness.  Surgical scars both knees Trace right lateral ankle edema  Neurological: She is alert and oriented to person, place, and time. She has normal reflexes. No cranial nerve deficit. She exhibits normal muscle tone. Coordination normal.  Decreased vibratory sensation distally  Skin: Skin is warm and dry. No rash noted.  Psychiatric: She has a normal mood and affect. Her behavior is normal.          Assessment & Plan:   Preventive health examination History of atrial fibrillation status post ablation Vertigo Osteoarthritis Stage 1 R Breast  Ca  Laboratory update will be reviewed. Mammogram scheduled for next week Recheck one year Exercise weight loss all encouraged

## 2012-08-17 NOTE — Patient Instructions (Signed)
Limit your sodium (Salt) intake    It is important that you exercise regularly, at least 20 minutes 3 to 4 times per week.  If you develop chest pain or shortness of breath seek  medical attention.  Return in one year for follow-up   

## 2012-09-27 ENCOUNTER — Encounter: Payer: Self-pay | Admitting: Internal Medicine

## 2012-09-27 ENCOUNTER — Ambulatory Visit (INDEPENDENT_AMBULATORY_CARE_PROVIDER_SITE_OTHER): Payer: Medicare Other | Admitting: Internal Medicine

## 2012-09-27 VITALS — BP 140/80 | HR 82 | Temp 98.1°F | Resp 20 | Wt 213.0 lb

## 2012-09-27 DIAGNOSIS — L0291 Cutaneous abscess, unspecified: Secondary | ICD-10-CM

## 2012-09-27 NOTE — Patient Instructions (Signed)
Call or return to clinic prn if these symptoms worsen or fail to improve as anticipated.

## 2012-09-27 NOTE — Progress Notes (Signed)
Subjective:    Patient ID: Selena [name], female    DOB: Jul 18, 1935, 77 y.o.   MRN: [id number]  HPI   77 year old patient who presents with a several day history of an enlarging painful lesion in the genital area. She apparently has had a asymptomatic nodule present for some time. There's been no fever or other systemic complaints.  Past Medical History  Diagnosis Date  . BACK PAIN, UPPER 07/09/2008  . DIVERTICULOSIS, COLON 12/26/2006  . LIVER FUNCTION TESTS, ABNORMAL, HX OF 12/26/2007  . Memory loss 05/16/2007  . VERTIGO 09/07/2009  . Cataract   . Goiter     multi-nodular  . Hx of colonoscopy 2009  . Wears glasses   . Wears hearing aid     bilateral  . Wears dentures   . Complication of anesthesia   . PONV (postoperative nausea and vomiting)   . Atrial fibrillation 01/01/2007  . Vertigo     hx of;had Phenergan prn   . OSTEOARTHRITIS 12/26/2006    takes Diclofenac daily but has stopped for surgery  . Bruises easily   . Diverticulitis   . Skin cancer   . Breast cancer, stage 1     History   Social History  . Marital Status: Widowed    Spouse Name: N/A    Number of Children: N/A  . Years of Education: N/A   Occupational History  . Not on file.   Social History Main Topics  . Smoking status: Never Smoker   . Smokeless tobacco: Never Used  . Alcohol Use: No  . Drug Use: No  . Sexual Activity: Yes    Birth Control/ Protection: Surgical   Other Topics Concern  . Not on file   Social History Narrative  . No narrative on file    Past Surgical History  Procedure Laterality Date  . Abdominal hysterectomy    . Rotator cuff repair  1999    right  . Knee surgery  2004/2010    arthroscopic/ bilateral knee replacements  . Cataract extraction w/ intraocular lens  implant, bilateral  2010    bilateral  . Colonoscopy    . Appendectomy    . Ablation of dysrhythmic focus  2009  . Tmi  1998  . Colonoscopy    . Breast biopsy  2013  . Breast reconstruction  04/14/2011   Procedure: BREAST RECONSTRUCTION;  Surgeon: Etter Sjogren, MD;  Location: [facility] OR;  Service: Plastics;  Laterality: Right;  Placement of Right Breast Tissue Expander with  use of Flex HD for Breast Reconstruction  . Breast surgery      right sm, snbx    Family History  Problem Relation Age of Onset  . Colon cancer Mother   . Cancer Mother     colon  . Colon cancer Maternal Aunt   . Cancer Maternal Uncle   . Prostate cancer Maternal Uncle   . Prostate cancer Maternal Uncle   . Other Maternal Uncle   . Anesthesia problems Neg Hx   . Hypotension Neg Hx   . Malignant hyperthermia Neg Hx   . Pseudochol deficiency Neg Hx   . Cancer Sister     kidney  . Cancer Brother     lymph node    Allergies  Allergen Reactions  . Codeine   . Codeine Phosphate     Current Outpatient Prescriptions on File Prior to Visit  Medication Sig Dispense Refill  . aspirin 81 MG tablet Take 81 mg by mouth daily.      [name]  diclofenac (VOLTAREN) 75 MG EC tablet Take 1 tablet (75 mg total) by mouth 2 (two) times daily.  180 tablet  3  . letrozole (FEMARA) 2.5 MG tablet Take 1 tablet (2.5 mg total) by mouth daily.  90 tablet  12  . promethazine (PHENERGAN) 50 MG tablet Take 1 tablet (50 mg total) by mouth every 6 (six) hours as needed for nausea.  30 tablet  2   No current facility-administered medications on file prior to visit.    BP 140/80  Pulse 82  Temp(Src) 98.1 F (36.7 C) (Oral)  Resp 20  Wt 213 lb (96.616 kg)  BMI 33.35 kg/m2  SpO2 96%       Review of Systems  Constitutional: Negative.   HENT: Negative for hearing loss, congestion, sore throat, rhinorrhea, dental problem, sinus pressure and tinnitus.   Eyes: Negative for pain, discharge and visual disturbance.  Respiratory: Negative for cough and shortness of breath.   Cardiovascular: Negative for chest pain, palpitations and leg swelling.  Gastrointestinal: Negative for nausea, vomiting, abdominal pain, diarrhea, constipation, blood in  stool and abdominal distention.  Genitourinary: Negative for dysuria, urgency, frequency, hematuria, flank pain, vaginal bleeding, vaginal discharge, difficulty urinating, vaginal pain and pelvic pain.  Musculoskeletal: Negative for joint swelling, arthralgias and gait problem.  Skin: Positive for wound. Negative for rash.  Neurological: Negative for dizziness, syncope, speech difficulty, weakness, numbness and headaches.  Hematological: Negative for adenopathy.  Psychiatric/Behavioral: Negative for behavioral problems, dysphoric mood and agitation. The patient is not nervous/anxious.        Objective:   Physical Exam  Genitourinary:  1.5 cm social abscess involving the right labia majora          Assessment & Plan:   Abscess right labia majora.  After a local anesthesia with cryotherapy, the lesion was  I&D. Patient tolerated the procedure well. Antibiotic ointment applied and the wound dressed. Local wound care discussed

## 2012-10-11 ENCOUNTER — Other Ambulatory Visit: Payer: Self-pay | Admitting: *Deleted

## 2012-10-11 DIAGNOSIS — D0590 Unspecified type of carcinoma in situ of unspecified breast: Secondary | ICD-10-CM

## 2012-10-11 MED ORDER — LETROZOLE 2.5 MG PO TABS: 2.5000 mg | ORAL_TABLET | Freq: Every day | ORAL | Status: DC

## 2012-10-30 ENCOUNTER — Other Ambulatory Visit: Payer: Self-pay | Admitting: Emergency Medicine

## 2012-11-12 ENCOUNTER — Encounter: Payer: Self-pay | Admitting: Internal Medicine

## 2012-11-20 ENCOUNTER — Encounter (INDEPENDENT_AMBULATORY_CARE_PROVIDER_SITE_OTHER): Payer: Self-pay | Admitting: General Surgery

## 2012-11-20 ENCOUNTER — Ambulatory Visit (INDEPENDENT_AMBULATORY_CARE_PROVIDER_SITE_OTHER): Payer: Medicare Other | Admitting: General Surgery

## 2012-11-20 VITALS — BP 112/78 | HR 74 | Temp 96.7°F | Resp 14 | Ht 67.0 in | Wt 213.0 lb

## 2012-11-20 DIAGNOSIS — Z853 Personal history of malignant neoplasm of breast: Secondary | ICD-10-CM

## 2012-11-20 NOTE — Patient Instructions (Signed)
.Breast Self-Examination You should begin examining your breasts at age 77 even though the risk for breast cancer is low in this age group. It is important to become familiar with how your breasts look and feel. This is true for pregnant women, nursing mothers, women in menopause and women who have breast implants.  Women should examine their breasts once a month to look for changes and lumps. By doing monthly breast exams, you get to know how your breasts feel and how they can change from month to month. This allows you to pick up changes early. It can also offer you some reassurance that your breast health is good. This exam only takes minutes. Most breast lumps are not caused by cancer. If you find a lump, a special x-ray called a mammogram, or other tests may be needed to determine what is wrong.  Some of the signs that a breast lump is caused by cancer include:  Dimpling of the skin or changes in the shape of the breast or nipple.   A dark-colored or bloody discharge from the nipple.   Swollen lymph glands around the breast or in the armpit.   Redness of the breast or nipple.   Scaly nipple or skin on the breast.   Pain or swelling of the breast.  SELF-EXAM There are a few points to follow when doing a thorough breast exam. The best time to examine your breasts is 5 to 7 days after the menstrual period is over. During menstruation, the breasts are lumpier, and it may be more difficult to pick up changes. If you do not menstruate, have reached menopause or had a hysterectomy, examine your breasts the first day of every month. After three to four months, you will become more familiar with the variations of your breasts and more comfortable with the exam.  Perform your breast exam monthly. Keep a written record with breast changes or normal findings for each breast. This makes it easier to be sure of changes and to not solely depend on memory for size, tenderness, or location. Try to do the exam  at the same time each month, and write down where you are in your menstrual cycle if you are still menstruating.   Look at your breasts. Stand in front of a mirror with your hands clasped behind your head. Tighten your chest muscles and look for asymmetry. This means a difference in shape or contour from one breast to the other, such as puckers, dips or bumps. Look also for skin changes.   Lean forward with your hands on your hips. Again, look for symmetry and skin changes.   While showering, soap the breasts, and carefully feel the breasts with fingertips while holding the arm (on the side of the breast being examined) over the head. Do this with each breast carefully feeling for lumps or changes. Typically, a circular motion with moderate fingertip pressure should be used.   Repeat this exam while lying on your back, again with your arm behind your head and a pillow under your shoulders. Again, use your fingertips to examine both breasts, feeling for lumps and thickening. Begin at 1 o'clock and go clockwise around the whole breast.   At the end of your exam, gently squeeze each nipple to see if there is any drainage. Look for nipple changes, dimpling or redness.   Lastly, examine the upper chest and clavicle areas and in your armpits.  It is not necessary to become alarmed if you find  a breast lump. Most of them are not cancerous. However, it is necessary to see your caregiver if a lump is found in order to have it looked at. Document Released: 02/18/2004 Document Revised: 09/22/2010 Document Reviewed: 04/29/2008 [facility] Patient Information 2012 [place], [place].

## 2012-11-21 NOTE — Progress Notes (Signed)
Subjective:     Patient ID: Selena [name], female   DOB: 1935-03-06, 77 y.o.   MRN: [id number]  HPI This is a 77 year old female underwent a  right simple mastectomy with a sentinel node biopsy and she had an immediate reconstruction with a tissue expander at the same time. [name] Her final pathology showed 2 microscopic foci of invasive ductal carcinoma with negative nodes giving her a stage I breast cancer. She is on letrozole which she is tolerating well right now. MM 2/14 is below on the left side.  She does complain of some lateral breast pain on the right which is keeping her from playing golf and bothers her.    Review of Systems MAMMOGRAPHIC UNILATERAL LEFT DIGITAL SCREENING WITH CAD  Comparison: 11/04/2005  FINDINGS:  ACR Breast Density Category 2: There are scattered fibroglandular  densities.  There is no suspicious dominant mass, architectural distortion, or  calcification to suggest malignancy.  Images were processed with CAD.  IMPRESSION:  No mammographic evidence of malignancy.  A result letter of this screening mammogram will be mailed directly  to the patient.  RECOMMENDATION:  Screening mammogram in one year. (Code:SM-B-01Y)  BI-RADS CATEGORY 1: Negative.     Objective:   Physical Exam  Constitutional: She appears well-developed and well-nourished.  Pulmonary/Chest: Right breast exhibits tenderness. Right breast exhibits no mass. Left breast exhibits no inverted nipple, no mass, no nipple discharge, no skin change and no tenderness.    Lymphadenopathy:    She has no cervical adenopathy.    She has no axillary adenopathy.       Right: No supraclavicular adenopathy present.       Left: No supraclavicular adenopathy present.       Assessment:     History stage I breast cancer     Plan:     I instructed her on how to massage the area that is bothering her as I think this is scar tissue.  I also referred to see PT as I think her right arm/shoulder is tight and  may be from this area.  I also offered to refer back to Dr Odis Luster. She said she has tried to call there.   She has no clinical evidence of recurrence and will continue her own self exams, I will continue to see annually and get her mm as scheduled. i asked her if this area does not improve to call me back.

## 2012-11-22 ENCOUNTER — Telehealth (INDEPENDENT_AMBULATORY_CARE_PROVIDER_SITE_OTHER): Payer: Self-pay | Admitting: *Deleted

## 2012-11-22 NOTE — Telephone Encounter (Signed)
LMOM for pt to call me b/c I want to give her the appt with PT for 12/03/12 arrive at 10:00/10:15. Pt will go to [employer] [facility] she needs to bring insurance card,photo ID,and list of medication.

## 2012-11-22 NOTE — Telephone Encounter (Signed)
Called PT to check on the status of appt with PT. Selena [name] had not made the appt yet but we went ahead scheduled the appt for 12/03/12 arrive 10:00/10:15 with physical therapy.

## 2012-11-22 NOTE — Telephone Encounter (Signed)
Patient called to report that Dr. Dwain Sarna told her someone would be contacting her regarding rehab however she has not heard from anyone at this time.  Patient just asking for an update.  Explained that I would send a message on to ask about this and someone would update her as soon as possible.  Patient states understanding and agreeable at this time.

## 2012-11-23 NOTE — Telephone Encounter (Signed)
Pt called back and I gave appt info with address.

## 2012-12-03 ENCOUNTER — Ambulatory Visit: Payer: Medicare Other | Attending: General Surgery | Admitting: Physical Therapy

## 2012-12-03 DIAGNOSIS — IMO0001 Reserved for inherently not codable concepts without codable children: Secondary | ICD-10-CM | POA: Insufficient documentation

## 2012-12-03 DIAGNOSIS — M25519 Pain in unspecified shoulder: Secondary | ICD-10-CM | POA: Insufficient documentation

## 2012-12-03 DIAGNOSIS — M25619 Stiffness of unspecified shoulder, not elsewhere classified: Secondary | ICD-10-CM | POA: Insufficient documentation

## 2012-12-05 ENCOUNTER — Ambulatory Visit: Payer: Medicare Other

## 2012-12-07 ENCOUNTER — Other Ambulatory Visit (INDEPENDENT_AMBULATORY_CARE_PROVIDER_SITE_OTHER): Payer: Self-pay | Admitting: *Deleted

## 2012-12-07 MED ORDER — UNABLE TO FIND: Status: DC

## 2012-12-10 ENCOUNTER — Telehealth: Payer: Self-pay | Admitting: Oncology

## 2012-12-10 ENCOUNTER — Other Ambulatory Visit (HOSPITAL_BASED_OUTPATIENT_CLINIC_OR_DEPARTMENT_OTHER): Payer: Medicare Other | Admitting: Lab

## 2012-12-10 ENCOUNTER — Encounter: Payer: Self-pay | Admitting: Family

## 2012-12-10 ENCOUNTER — Ambulatory Visit (HOSPITAL_BASED_OUTPATIENT_CLINIC_OR_DEPARTMENT_OTHER): Payer: Medicare Other | Admitting: Family

## 2012-12-10 ENCOUNTER — Ambulatory Visit: Payer: Medicare Other | Admitting: Oncology

## 2012-12-10 VITALS — BP 152/83 | HR 68 | Temp 98.0°F | Resp 18 | Ht 67.0 in | Wt 212.3 lb

## 2012-12-10 DIAGNOSIS — D0590 Unspecified type of carcinoma in situ of unspecified breast: Secondary | ICD-10-CM

## 2012-12-10 DIAGNOSIS — D0510 Intraductal carcinoma in situ of unspecified breast: Secondary | ICD-10-CM

## 2012-12-10 DIAGNOSIS — C50919 Malignant neoplasm of unspecified site of unspecified female breast: Secondary | ICD-10-CM

## 2012-12-10 DIAGNOSIS — C50019 Malignant neoplasm of nipple and areola, unspecified female breast: Secondary | ICD-10-CM

## 2012-12-10 DIAGNOSIS — Z853 Personal history of malignant neoplasm of breast: Secondary | ICD-10-CM

## 2012-12-10 LAB — COMPREHENSIVE METABOLIC PANEL (CC13)
ALT: 62 U/L — ABNORMAL HIGH (ref 0–55)
AST: 54 U/L — ABNORMAL HIGH (ref 5–34)
Albumin: 3.8 g/dL (ref 3.5–5.0)
Alkaline Phosphatase: 107 U/L (ref 40–150)
Anion Gap: 8 mEq/L (ref 3–11)
BUN: 23 mg/dL (ref 7.0–26.0)
CO2: 26 mEq/L (ref 22–29)
Calcium: 9.8 mg/dL (ref 8.4–10.4)
Chloride: 107 mEq/L (ref 98–109)
Creatinine: 0.8 mg/dL (ref 0.6–1.1)
Glucose: 118 mg/dl (ref 70–140)
Potassium: 4.3 mEq/L (ref 3.5–5.1)
Sodium: 141 mEq/L (ref 136–145)
Total Bilirubin: 0.78 mg/dL (ref 0.20–1.20)
Total Protein: 6.8 g/dL (ref 6.4–8.3)

## 2012-12-10 LAB — CBC WITH DIFFERENTIAL/PLATELET
Basophils Absolute: 0.1 10*3/uL (ref 0.0–0.1)
EOS%: 4.3 % (ref 0.0–7.0)
Eosinophils Absolute: 0.4 10*3/uL (ref 0.0–0.5)
HCT: 44.7 % (ref 34.8–46.6)
HGB: 15 g/dL (ref 11.6–15.9)
MCHC: 33.6 g/dL (ref 31.5–36.0)
MCV: 94.7 fL (ref 79.5–101.0)
MONO#: 0.6 10*3/uL (ref 0.1–0.9)
NEUT#: 4.9 10*3/uL (ref 1.5–6.5)
NEUT%: 58.3 % (ref 38.4–76.8)
RDW: 12.9 % (ref 11.2–14.5)
lymph#: 2.5 10*3/uL (ref 0.9–3.3)

## 2012-12-10 MED ORDER — LETROZOLE 2.5 MG PO TABS: 2.5000 mg | ORAL_TABLET | Freq: Every day | ORAL | Status: DC

## 2012-12-10 NOTE — Patient Instructions (Addendum)
Please contact us at [phone] if you have any questions or concerns.  Please continue to do well and enjoy life!!!  Get plenty of rest, drink plenty of water, exercise daily (walking), eat a balanced diet.  Take D3 1000 IUs daily.   Complete monthly self-breast examinations.  Have a clinical breast exam by a physician every year.  Have your mammogram completed every year.  Results for orders placed in visit on 12/10/12 (from the past 24 hour(s))  CBC WITH DIFFERENTIAL     Status: None   Collection Time    12/10/12 10:29 AM      Result Value Range   WBC 8.3  3.9 - 10.3 10e3/uL   NEUT# 4.9  1.5 - 6.5 10e3/uL   HGB 15.0  11.6 - 15.9 g/dL   HCT 78.2  95.6 - 21.3 %   Platelets 161  145 - 400 10e3/uL   MCV 94.7  79.5 - 101.0 fL   MCH 31.8  25.1 - 34.0 pg   MCHC 33.6  31.5 - 36.0 g/dL   RBC 0.86  5.78 - 4.69 10e6/uL   RDW 12.9  11.2 - 14.5 %   lymph# 2.5  0.9 - 3.3 10e3/uL   MONO# 0.6  0.1 - 0.9 10e3/uL   Eosinophils Absolute 0.4  0.0 - 0.5 10e3/uL   Basophils Absolute 0.1  0.0 - 0.1 10e3/uL   NEUT% 58.3  38.4 - 76.8 %   LYMPH% 29.7  14.0 - 49.7 %   MONO% 6.9  0.0 - 14.0 %   EOS% 4.3  0.0 - 7.0 %   BASO% 0.8  0.0 - 2.0 %   Narrative:    Performed At:  [facility]               [address]               [place], [place] [address]  COMPREHENSIVE METABOLIC PANEL (CC13)     Status: Abnormal   Collection Time    12/10/12 10:29 AM      Result Value Range   Sodium 141  136 - 145 mEq/L   Potassium 4.3  3.5 - 5.1 mEq/L   Chloride 107  98 - 109 mEq/L   CO2 26  22 - 29 mEq/L   Glucose 118  70 - 140 mg/dl   BUN 84.1  7.0 - 32.4 mg/dL   Creatinine 0.8  0.6 - 1.1 mg/dL   Total Bilirubin 4.01  0.20 - 1.20 mg/dL   Alkaline Phosphatase 107  40 - 150 U/L   AST 54 (*) 5 - 34 U/L   ALT 62 (*) 0 - 55 U/L   Total Protein 6.8  6.4 - 8.3 g/dL   Albumin 3.8  3.5 - 5.0 g/dL   Calcium 9.8  8.4 - 02.7 mg/dL   Anion Gap 8  3 - 11 mEq/L   Narrative:    Performed At:  [facility]               [address]               [place], [place] [address]

## 2012-12-10 NOTE — Telephone Encounter (Signed)
, °

## 2012-12-10 NOTE — Progress Notes (Addendum)
[facility]  [phone] [phone]  OFFICE PROGRESS NOTE    ID: Selena [name]   DOB: [birth year]  MR#: [id number]  GEX#:[id number]   PCP: Rogelia Boga, MD SU:  Emelia Loron, MD RAD ONC:  Lonie Peak, MD   DIAGNOSIS: Selena [name] is a 77 y.o. female diagnosed with ductal carcinoma in situ with associated calcifications of the right breast in 02/2011.   PRIOR THERAPY: #1 Status post right breast simple mastectomy with sentinel node biopsy on 04/14/2011 for a stage I, mpT1a, pN0, two microscopic foci of  0.2 cm, invasive ductal carcinoma, grade 1 in a background of extensive ductal carcinoma in situ, no evidence of angiolymphatic invasion identified, all resection margins were negative for atypia or malignancy, estrogen receptor 100% and 94% positive, progesterone receptor 100% and 6% positive, Ki-67 5% and 29%, HER-2/neu by CISH in no amplification with ratios of 1.10 and 0.75, with 0/5 metastatic right axillary lymph nodes.  #2 Underwent immediate reconstruction with a tissue expander placement.  #3 Antiestrogen therapy with Arimidex started in 05/2011, however the patient was unable to tolerate this medication and it was discontinued after 2 weeks.  Antiestrogen therapy change to Aromasin, but the patient was not able to tolerate this medication either.  Started antiestrogen therapy with Letrozole in 09/2011 and has been tolerating this medication thus far.   CURRENT THERAPY:  Antiestrogen therapy with Letrozole 2.5 mg PO daily.   INTERVAL HISTORY: Selena [name] is a 77 y.o. female who returns for followup.  Since her last office visit on 04/23/2012 she continues antiestrogen therapy with Letrozole without incident.  She has complaints of left breast muscle tightness by surgical scar for the past 2 weeks.  Dr. Dwain Sarna has scheduled her to receive physical therapy for the muscle tightness.  Her interval history is otherwise stable  and unremarkable.  MEDICAL HISTORY: Past Medical History  Diagnosis Date  . BACK PAIN, UPPER 07/09/2008  . DIVERTICULOSIS, COLON 12/26/2006  . LIVER FUNCTION TESTS, ABNORMAL, HX OF 12/26/2007  . Memory loss 05/16/2007  . VERTIGO 09/07/2009  . Cataract   . Goiter     multi-nodular  . Hx of colonoscopy 2009  . Wears glasses   . Wears hearing aid     bilateral  . Wears dentures   . Complication of anesthesia   . PONV (postoperative nausea and vomiting)   . Atrial fibrillation 01/01/2007  . Vertigo     hx of;had Phenergan prn   . OSTEOARTHRITIS 12/26/2006    takes Diclofenac daily but has stopped for surgery  . Bruises easily   . Diverticulitis   . Skin cancer   . Breast cancer, stage 1     ALLERGIES: Allergies  Allergen Reactions  . Codeine   . Codeine Phosphate      MEDICATIONS:  Current Outpatient Prescriptions  Medication Sig Dispense Refill  . aspirin 81 MG tablet Take 81 mg by mouth daily.      . diclofenac (VOLTAREN) 75 MG EC tablet Take 1 tablet (75 mg total) by mouth 2 (two) times daily.  180 tablet  3  . letrozole (FEMARA) 2.5 MG tablet Take 1 tablet (2.5 mg total) by mouth daily.  90 tablet  4  . promethazine (PHENERGAN) 50 MG tablet Take 1 tablet (50 mg total) by mouth every 6 (six) hours as needed for nausea.  30 tablet  2  . UNABLE TO FIND Rx: L8000- Post Surgical Bras (Quantity: 6) W1027- Silicone Breast Prosthesis (  Quantity: 1) Dx: 174.9; Right Mastectomy  1 each  0   No current facility-administered medications for this visit.    SURGICAL HISTORY:  Past Surgical History  Procedure Laterality Date  . Abdominal hysterectomy    . Rotator cuff repair  1999    right  . Knee surgery  2004/2010    arthroscopic/ bilateral knee replacements  . Cataract extraction w/ intraocular lens  implant, bilateral  2010    bilateral  . Colonoscopy    . Appendectomy    . Ablation of dysrhythmic focus  2009  . Tmi  1998  . Colonoscopy    . Breast biopsy  2013  .  Breast reconstruction  04/14/2011    Procedure: BREAST RECONSTRUCTION;  Surgeon: Etter Sjogren, MD;  Location: [facility] OR;  Service: Plastics;  Laterality: Right;  Placement of Right Breast Tissue Expander with  use of Flex HD for Breast Reconstruction  . Breast surgery      right sm, snbx    REVIEW OF SYSTEMS:  Pertinent items are noted in HPI. a 10 point review of systems was completed and is negative except as noted above.  Selena [name] denies any other symptomatology including  fatigue, fever or chills, headache, vision changes, swollen glands, cough or shortness of breath, chest pain or discomfort, nausea, vomiting, diarrhea, constipation, change in urinary or bowel habits, any other arthralgias/myalgias, unusual bleeding/bruising or any other symptomatology.   PHYSICAL EXAMINATION: Blood pressure 152/83, pulse 68, temperature 98 F (36.7 C), temperature source Oral, resp. rate 18, height 5\' 7"  (1.702 m), weight 212 lb 4.8 oz (96.299 kg).  ECOG FS: 1 - Symptomatic but completely ambulatory  General appearance: Alert, cooperative, well nourished, no apparent distress Head: Normocephalic, without obvious abnormality, atraumatic,right hearing aid, dentures Eyes: Arcus senilis, PERRLA, EOMI Nose: Nares, septum and mucosa are normal, no drainage or sinus tenderness Neck: No adenopathy, supple, symmetrical, trachea midline, no tenderness Resp: Clear to auscultation bilaterally, no wheezes/rales/rhonchi Cardio: Regular rate and rhythm, S1, S2 normal, no murmur, click, rub or gallop, no edema Breasts:  Left breast is pendulous right breast has been reconstructed, right breast has well-healed surgical scars, skin tightness by surgical scar on right breast, bilateral axillary unless, left breast has glandular tissue, left breast has firm medial and inframammary ridges GI: Soft, distended, non-tender, hypoactive bowel sounds, no organomegaly Skin: No rashes/lesions, skin warm and dry, no erythematous areas,  no cyanosis  M/S:  Atraumatic, normal strength in all extremities, normal range of motion, no clubbing  Lymph nodes: Cervical, supraclavicular, and axillary nodes normal Neurologic: Grossly normal, cranial nerves II through XII intact, alert and oriented x 3 Psych: Appropriate affect   LABORATORY DATA: Lab Results  Component Value Date   WBC 8.3 12/10/2012   HGB 15.0 12/10/2012   HCT 44.7 12/10/2012   MCV 94.7 12/10/2012   PLT 161 12/10/2012      Chemistry      Component Value Date/Time   NA 141 12/10/2012 1029   NA 141 07/22/2011 1448   K 4.3 12/10/2012 1029   K 4.0 07/22/2011 1448   CL 107 04/05/2012 1253   CL 106 07/22/2011 1448   CO2 26 12/10/2012 1029   CO2 25 07/22/2011 1448   BUN 23.0 12/10/2012 1029   BUN 23 07/22/2011 1448   CREATININE 0.8 12/10/2012 1029   CREATININE 0.73 07/22/2011 1448      Component Value Date/Time   CALCIUM 9.8 12/10/2012 1029   CALCIUM 9.5 07/22/2011 1448   ALKPHOS  107 12/10/2012 1029   ALKPHOS 90 07/22/2011 1448   AST 54* 12/10/2012 1029   AST 26 07/22/2011 1448   ALT 62* 12/10/2012 1029   ALT 30 07/22/2011 1448   BILITOT 0.78 12/10/2012 1029   BILITOT 0.4 07/22/2011 1448     IMAGING: Mm Digital Screening Unilat L 03/18/2012   *RADIOLOGY REPORT*  Clinical Data: Screening.  MAMMOGRAPHIC UNILATERAL LEFT DIGITAL SCREENING WITH CAD  Comparison:  11/04/2005  FINDINGS:  ACR Breast Density Category 2: There are scattered fibroglandular densities.  There is no suspicious dominant mass, architectural distortion, or calcification to suggest malignancy.  Images were processed with CAD.  IMPRESSION: No mammographic evidence of malignancy.  A result letter of this screening mammogram will be mailed directly to the patient.  RECOMMENDATION: Screening mammogram in one year. (Code:SM-B-01Y)  BI-RADS CATEGORY 1:  Negative.   Original Report Authenticated By: Cain Saupe, M.D.    ASSESSMENT: RHIAN ASEBEDO is a 77 y.o. female:  #1 Status post right breast  simple mastectomy with sentinel node biopsy on 04/14/2011 for a stage I, mpT1a, pN0, two microscopic foci of  0.2 cm, invasive ductal carcinoma, grade 1 in a background of extensive ductal carcinoma in situ, no evidence of angiolymphatic invasion identified, all resection margins were negative for atypia or malignancy, estrogen receptor 100% and 94% positive, progesterone receptor 100% and 6% positive, Ki-67 5% and 29%, HER-2/neu by CISH in no amplification with ratios of 1.10 and 0.75, with 0/5 metastatic right axillary lymph nodes.  #2 Underwent immediate reconstruction with a tissue expander placement.  #3 Antiestrogen therapy with Arimidex started in 05/2011, however the patient was unable to tolerate this medication and it was discontinued after 2 weeks.  Antiestrogen therapy change to Aromasin, but the patient was not able to tolerate this medication either.  Started antiestrogen therapy with Letrozole in 09/2011 and has been tolerating this medication thus far.  #4 Right breast muscle tightness by surgical scar - Dr. Dwain Sarna has scheduled the patient to receive physical therapy at [facility].   PLAN: 1.  She will continue antiestrogen therapy with letrozole 2.5 mg by mouth daily.  An electronic prescription for letrozole #90 with 4 refills was sent to the patient's pharmacy.  2.  We will schedule the patient for her next unilateral left digital screening mammogram due in 02/2013 and her next bone density scan due in 06/2013 for her.  3.  We plan to see Selena [name] again in 6 months at which time we will check laboratories of CBC, CMP, and vitamin D level.  All questions answered.  Selena [name] encouraged to contact us in the interim with any questions, concerns, or problems.   Larina Bras, NP-C 12/12/2012   11:31 AM     ATTENDING'S ATTESTATION:  I personally reviewed patient's chart, examined patient myself, formulated the treatment plan as followed.     Patient is a 77 year old female with DCIS and invasive breast cancer. She was treated initially by Dr. Pierce Crane. She is now continuing antiestrogen therapy with letrozole 2.5 mg daily. Overall she is tolerating it very well. She is up-to-date on her mammograms. We discussed side effects of treatment. Total of 5 years of therapy is planned for her with Arimidex. Her overall prognosis is excellent.  Drue Second, MD Medical/Oncology [facility] [phone] (beeper) [phone] (Office)  12/26/2012, 9:30 AM

## 2012-12-11 ENCOUNTER — Ambulatory Visit: Payer: Medicare Other | Admitting: Physical Therapy

## 2012-12-17 ENCOUNTER — Ambulatory Visit: Payer: Medicare Other | Admitting: Physical Therapy

## 2012-12-19 ENCOUNTER — Ambulatory Visit: Payer: Medicare Other | Admitting: Physical Therapy

## 2012-12-24 ENCOUNTER — Ambulatory Visit: Payer: Medicare Other | Attending: General Surgery | Admitting: Physical Therapy

## 2012-12-24 DIAGNOSIS — M25619 Stiffness of unspecified shoulder, not elsewhere classified: Secondary | ICD-10-CM | POA: Insufficient documentation

## 2012-12-24 DIAGNOSIS — M25519 Pain in unspecified shoulder: Secondary | ICD-10-CM | POA: Insufficient documentation

## 2012-12-24 DIAGNOSIS — IMO0001 Reserved for inherently not codable concepts without codable children: Secondary | ICD-10-CM | POA: Insufficient documentation

## 2012-12-26 ENCOUNTER — Encounter: Payer: Medicare Other | Admitting: Physical Therapy

## 2013-06-24 ENCOUNTER — Ambulatory Visit
Admission: RE | Admit: 2013-06-24 | Discharge: 2013-06-24 | Disposition: A | Discharge status: Home / Self Care | Payer: Medicare Other | Source: Ambulatory Visit | Attending: Family | Admitting: Family

## 2013-06-24 ENCOUNTER — Encounter (INDEPENDENT_AMBULATORY_CARE_PROVIDER_SITE_OTHER): Payer: Self-pay

## 2013-06-24 DIAGNOSIS — Z853 Personal history of malignant neoplasm of breast: Secondary | ICD-10-CM

## 2013-07-05 ENCOUNTER — Other Ambulatory Visit: Payer: Self-pay | Admitting: Internal Medicine

## 2013-07-24 ENCOUNTER — Telehealth: Payer: Self-pay | Admitting: Hematology and Oncology

## 2013-07-24 NOTE — Telephone Encounter (Signed)
, °

## 2013-08-05 ENCOUNTER — Other Ambulatory Visit: Payer: Medicare Other

## 2013-08-05 ENCOUNTER — Ambulatory Visit: Payer: Medicare Other | Admitting: Oncology

## 2013-08-12 ENCOUNTER — Other Ambulatory Visit (INDEPENDENT_AMBULATORY_CARE_PROVIDER_SITE_OTHER): Payer: Medicare Other

## 2013-08-12 DIAGNOSIS — Z136 Encounter for screening for cardiovascular disorders: Secondary | ICD-10-CM

## 2013-08-12 DIAGNOSIS — M549 Dorsalgia, unspecified: Secondary | ICD-10-CM

## 2013-08-12 DIAGNOSIS — I4891 Unspecified atrial fibrillation: Secondary | ICD-10-CM

## 2013-08-12 DIAGNOSIS — Z0000 Encounter for general adult medical examination without abnormal findings: Secondary | ICD-10-CM

## 2013-08-12 LAB — BASIC METABOLIC PANEL
BUN: 20 mg/dL (ref 6–23)
CHLORIDE: 106 meq/L (ref 96–112)
CO2: 27 mEq/L (ref 19–32)
Calcium: 9.8 mg/dL (ref 8.4–10.5)
Creatinine, Ser: 0.6 mg/dL (ref 0.4–1.2)
GFR: 95.94 mL/min (ref >60.00)
Glucose, Bld: 89 mg/dL (ref 70–99)
POTASSIUM: 4.2 meq/L (ref 3.5–5.1)
Sodium: 141 mEq/L (ref 135–145)

## 2013-08-12 LAB — HEPATIC FUNCTION PANEL
ALBUMIN: 4.1 g/dL (ref 3.5–5.2)
ALT: 52 U/L — AB (ref 0–35)
AST: 42 U/L — ABNORMAL HIGH (ref 0–37)
Alkaline Phosphatase: 106 U/L (ref 39–117)
Bilirubin, Direct: 0.2 mg/dL (ref 0.0–0.3)
Total Bilirubin: 0.7 mg/dL (ref 0.2–1.2)
Total Protein: 7.1 g/dL (ref 6.0–8.3)

## 2013-08-12 LAB — POCT URINALYSIS DIPSTICK
Bilirubin, UA: NEGATIVE
Blood, UA: NEGATIVE
Glucose, UA: NEGATIVE
Ketones, UA: NEGATIVE
Nitrite, UA: NEGATIVE
Protein, UA: NEGATIVE
SPEC GRAV UA: 1.02
UROBILINOGEN UA: 0.2
pH, UA: 5.5

## 2013-08-12 LAB — LIPID PANEL
CHOL/HDL RATIO: 5
Cholesterol: 176 mg/dL (ref 0–200)
HDL: 38.6 mg/dL — ABNORMAL LOW (ref >39.00)
LDL CALC: 111 mg/dL — AB (ref 0–99)
NonHDL: 137.4
Triglycerides: 134 mg/dL (ref 0.0–149.0)
VLDL: 26.8 mg/dL (ref 0.0–40.0)

## 2013-08-12 LAB — CBC WITH DIFFERENTIAL/PLATELET
BASOS PCT: 0.5 % (ref 0.0–3.0)
Basophils Absolute: 0 10*3/uL (ref 0.0–0.1)
EOS PCT: 5.5 % — AB (ref 0.0–5.0)
Eosinophils Absolute: 0.4 10*3/uL (ref 0.0–0.7)
HEMATOCRIT: 45.8 % (ref 36.0–46.0)
Hemoglobin: 15.4 g/dL — ABNORMAL HIGH (ref 12.0–15.0)
LYMPHS ABS: 2.1 10*3/uL (ref 0.7–4.0)
Lymphocytes Relative: 26.6 % (ref 12.0–46.0)
MCHC: 33.7 g/dL (ref 30.0–36.0)
MCV: 95.7 fl (ref 78.0–100.0)
MONO ABS: 0.6 10*3/uL (ref 0.1–1.0)
Monocytes Relative: 7.9 % (ref 3.0–12.0)
NEUTROS ABS: 4.7 10*3/uL (ref 1.4–7.7)
Neutrophils Relative %: 59.5 % (ref 43.0–77.0)
Platelets: 181 10*3/uL (ref 150.0–400.0)
RBC: 4.78 Mil/uL (ref 3.87–5.11)
RDW: 13.1 % (ref 11.5–15.5)
WBC: 7.9 10*3/uL (ref 4.0–10.5)

## 2013-08-13 LAB — TSH: TSH: 0.2 u[IU]/mL — ABNORMAL LOW (ref 0.35–4.50)

## 2013-08-19 ENCOUNTER — Encounter: Payer: Medicare Other | Admitting: Internal Medicine

## 2013-08-26 ENCOUNTER — Encounter: Payer: Self-pay | Admitting: Internal Medicine

## 2013-08-26 ENCOUNTER — Ambulatory Visit (INDEPENDENT_AMBULATORY_CARE_PROVIDER_SITE_OTHER): Payer: Medicare Other | Admitting: Internal Medicine

## 2013-08-26 VITALS — BP 130/76 | HR 84 | Temp 98.3°F | Resp 20 | Ht 66.75 in | Wt 210.0 lb

## 2013-08-26 DIAGNOSIS — D0511 Intraductal carcinoma in situ of right breast: Secondary | ICD-10-CM

## 2013-08-26 DIAGNOSIS — K5730 Diverticulosis of large intestine without perforation or abscess without bleeding: Secondary | ICD-10-CM

## 2013-08-26 DIAGNOSIS — M1990 Unspecified osteoarthritis, unspecified site: Secondary | ICD-10-CM

## 2013-08-26 DIAGNOSIS — Z0000 Encounter for general adult medical examination without abnormal findings: Secondary | ICD-10-CM

## 2013-08-26 DIAGNOSIS — Z23 Encounter for immunization: Secondary | ICD-10-CM

## 2013-08-26 DIAGNOSIS — I4891 Unspecified atrial fibrillation: Secondary | ICD-10-CM

## 2013-08-26 DIAGNOSIS — D0590 Unspecified type of carcinoma in situ of unspecified breast: Secondary | ICD-10-CM

## 2013-08-26 MED ORDER — DICLOFENAC SODIUM 75 MG PO TBEC: DELAYED_RELEASE_TABLET | ORAL | Status: DC

## 2013-08-26 MED ORDER — FUROSEMIDE 20 MG PO TABS: ORAL_TABLET | ORAL | Status: DC

## 2013-08-26 NOTE — Progress Notes (Signed)
Pre visit review using our clinic review tool, if applicable. No additional management support is needed unless otherwise documented below in the visit note. 

## 2013-08-26 NOTE — Patient Instructions (Signed)
Limit your sodium (Salt) intake    It is important that you exercise regularly, at least 20 minutes 3 to 4 times per week.  If you develop chest pain or shortness of breath seek  medical attention.  You need to lose weight.  Consider a lower calorie diet and regular exercise.  Return in one year for follow-up 

## 2013-08-26 NOTE — Progress Notes (Signed)
Patient ID: Selena [name], female   DOB: 1935/09/19, 78 y.o.   MRN: [id number]  Subjective:    Patient ID: Selena [name], female    DOB: 1935-04-03, 78 y.o.   MRN: [id number]  HPI  History of Present Illness:   36 -year-old patient who is seen today for a wellness exam; she has had a  full neurological evaluation in the past due to vertigo.  This is much improved and she is back to playing golf occasionally. She has been followed by ENT and has received vestibular rehabilitation.   She has a history of atrial fibrillation, status post ablation. She has DJD history of mild cognitive impairment. Today she is doing quite well. Colonoscopy 2012.  Followed by oncology for Stage 1 R breast Ca; status post right mastectomy with implant   Past Medical History  Diagnosis Date  . BACK PAIN, UPPER 07/09/2008  . DIVERTICULOSIS, COLON 12/26/2006  . LIVER FUNCTION TESTS, ABNORMAL, HX OF 12/26/2007  . Memory loss 05/16/2007  . VERTIGO 09/07/2009  . Cataract   . Goiter     multi-nodular  . Hx of colonoscopy 2009  . Wears glasses   . Wears hearing aid     bilateral  . Wears dentures   . Complication of anesthesia   . PONV (postoperative nausea and vomiting)   . Atrial fibrillation 01/01/2007  . Vertigo     hx of;had Phenergan prn   . OSTEOARTHRITIS 12/26/2006    takes Diclofenac daily but has stopped for surgery  . Bruises easily   . Diverticulitis   . Skin cancer   . Breast cancer, stage 1     History   Social History  . Marital Status: Widowed    Spouse Name: N/A    Number of Children: N/A  . Years of Education: N/A   Occupational History  . Not on file.   Social History Main Topics  . Smoking status: Never Smoker   . Smokeless tobacco: Never Used  . Alcohol Use: No  . Drug Use: No  . Sexual Activity: Yes    Birth Control/ Protection: Surgical   Other Topics Concern  . Not on file   Social History Narrative  . No narrative on file    Past Surgical History  Procedure  Laterality Date  . Abdominal hysterectomy    . Rotator cuff repair  1999    right  . Knee surgery  2004/2010    arthroscopic/ bilateral knee replacements  . Cataract extraction w/ intraocular lens  implant, bilateral  2010    bilateral  . Colonoscopy    . Appendectomy    . Ablation of dysrhythmic focus  2009  . Tmi  1998  . Colonoscopy    . Breast biopsy  2013  . Breast reconstruction  04/14/2011    Procedure: BREAST RECONSTRUCTION;  Surgeon: Crissie Reese, MD;  Location: [place];  Service: Plastics;  Laterality: Right;  Placement of Right Breast Tissue Expander with  use of Flex HD for Breast Reconstruction  . Breast surgery      right sm, snbx    Family History  Problem Relation Age of Onset  . Colon cancer Mother   . Cancer Mother     colon  . Colon cancer Maternal Aunt   . Cancer Maternal Uncle   . Prostate cancer Maternal Uncle   . Prostate cancer Maternal Uncle   . Other Maternal Uncle   . Anesthesia problems Neg Hx   . Hypotension  Neg Hx   . Malignant hyperthermia Neg Hx   . Pseudochol deficiency Neg Hx   . Cancer Sister     kidney  . Cancer Brother     lymph node    Allergies  Allergen Reactions  . Codeine   . Codeine Phosphate     Current Outpatient Prescriptions on File Prior to Visit  Medication Sig Dispense Refill  . aspirin 81 MG tablet Take 81 mg by mouth daily.      [name] letrozole (FEMARA) 2.5 MG tablet Take 1 tablet (2.5 mg total) by mouth daily.  90 tablet  4  . promethazine (PHENERGAN) 50 MG tablet Take 1 tablet (50 mg total) by mouth every 6 (six) hours as needed for nausea.  30 tablet  2  . UNABLE TO FIND Rx: L8000- Post Surgical Bras (Quantity: 6) Y6948- Silicone Breast Prosthesis (Quantity: 1) Dx: 174.9; Right Mastectomy  1 each  0   No current facility-administered medications on file prior to visit.    BP 130/76  Pulse 84  Temp(Src) 98.3 F (36.8 C) (Oral)  Resp 20  Ht 5' 6.75" (1.695 m)  Wt 210 lb (95.255 kg)  BMI 33.15 kg/m2  SpO2  98%     Here for Medicare AWV:   1. Risk factors based on Past M, S, F history: Patient has atrial fibrillation but no significant cardiovascular risk factors 2. Physical Activities: no activity restrictions; has been fairly inactive due to vertigo, but this now has stabilized  3. Depression/mood: history depression, or mood disorder  4. Hearing: wears a hearing bilaterally  5. ADL's: independent in all aspects of daily living  6. Fall Risk: Low 7. Home Safety: no problems identified  8. Height, weight, &visual acuity:height and weight stable. No difficulty with visual acuity  9. Counseling: weight loss, heart healthy diet and exercise all encouraged  10. Labs ordered based on risk factors: laboratory studies will be reviewed  11. Referral Coordination-  mammogram recently performed. Followup oncology  12. Care Plan- exercise weight loss. All encouraged  13. Cognitive Assessment- patient has a remote history of cognitive impairment ;  she appears alert oriented and appropriate and there is been no progression 14.  Patient has had a recent bone density examination.  Colonoscopy 2012.  She is followed by oncology with breast cancer and has annual mammograms 15.  Current providers include oncology.  She has been evaluated by neurology, ENT, and GI and cardiology in the past  Allergies:  1) ! Codeine Phosphate (Codeine Phosphate)   Past History:  Past Medical History:   Diverticulosis, colon  Osteoarthritis  Elevated LFT's  multi-nodular goiter  history of gastritis, 1993  history benign 3-mm nodule right upper lobe (. Chest CT 03-24-2006)  paroxysmal atrial fibrillation and ablation December 2008  mild cognitive impairment  benign vertigo   Past Surgical History:    Hysterectomy  Rotator cuff repair  Arthroscopic knee  colonoscopy March 2007  2012 status post DC cardioversion December 2008  status post radiofrequency ablation in January 2009  stress Myoview January 2009   S/p  R  mastectomy 3/13  Family History:   father died of drowning death at age 86  mother died age 69, colon cancer  3 brothers positive for colonic polyps ; one brother deceased from lung cancer 3 sisters, one died of renal cancer   Social History:   widowed, retired, active with golf in the past    Review of Systems  Constitutional: Negative for fever,  appetite change, fatigue and unexpected weight change.  HENT: Negative for congestion, dental problem, ear pain, hearing loss, mouth sores, nosebleeds, sinus pressure, sore throat, tinnitus, trouble swallowing and voice change.   Eyes: Negative for photophobia, pain, redness and visual disturbance.  Respiratory: Negative for cough, chest tightness and shortness of breath.   Cardiovascular: Negative for chest pain, palpitations and leg swelling.  Gastrointestinal: Negative for nausea, vomiting, abdominal pain, diarrhea, constipation, blood in stool, abdominal distention and rectal pain.  Genitourinary: Negative for dysuria, urgency, frequency, hematuria, flank pain, vaginal bleeding, vaginal discharge, difficulty urinating, genital sores, vaginal pain, menstrual problem and pelvic pain.  Musculoskeletal: Negative for arthralgias, back pain and neck stiffness.  Skin: Negative for rash.  Neurological: Negative for dizziness, syncope, speech difficulty, weakness, light-headedness, numbness and headaches.  Hematological: Negative for adenopathy. Does not bruise/bleed easily.  Psychiatric/Behavioral: Negative for suicidal ideas, behavioral problems, self-injury, dysphoric mood and agitation. The patient is not nervous/anxious.        Objective:   Physical Exam  Constitutional: She is oriented to person, place, and time. She appears well-developed and well-nourished.  HENT:  Head: Normocephalic and atraumatic.  Right Ear: External ear normal.  Left Ear: External ear normal.  Mouth/Throat: Oropharynx is clear and moist.  Hearing  aids bilaterally  Dentures in place  Eyes: Conjunctivae and EOM are normal.  Neck: Normal range of motion. Neck supple. No JVD present. No thyromegaly present.  Cardiovascular: Normal rate, regular rhythm, normal heart sounds and intact distal pulses.   No murmur heard. Pulmonary/Chest: Effort normal and breath sounds normal. She has no wheezes. She has no rales.  Status post right breast implant  Abdominal: Soft. Bowel sounds are normal. She exhibits no distension and no mass. There is no tenderness. There is no rebound and no guarding.  Lower midline scar  Genitourinary: Vagina normal.  Musculoskeletal: Normal range of motion. She exhibits no edema and no tenderness.  Surgical scars both knees Trace right lateral ankle edema  Neurological: She is alert and oriented to person, place, and time. She has normal reflexes. No cranial nerve deficit. She exhibits normal muscle tone. Coordination normal.  Decreased vibratory sensation distally  Skin: Skin is warm and dry. No rash noted.  Psychiatric: She has a normal mood and affect. Her behavior is normal.          Assessment & Plan:   Preventive health examination History of atrial fibrillation status post ablation Vertigo Osteoarthritis Stage 1 R Breast  Ca  Laboratory update will be reviewed. Mammogram scheduled for next week Recheck one year Exercise weight loss all encouraged

## 2013-10-25 ENCOUNTER — Other Ambulatory Visit: Payer: Self-pay

## 2013-10-25 DIAGNOSIS — D0510 Intraductal carcinoma in situ of unspecified breast: Secondary | ICD-10-CM

## 2013-10-28 ENCOUNTER — Other Ambulatory Visit (HOSPITAL_BASED_OUTPATIENT_CLINIC_OR_DEPARTMENT_OTHER): Payer: Medicare Other

## 2013-10-28 ENCOUNTER — Ambulatory Visit (HOSPITAL_BASED_OUTPATIENT_CLINIC_OR_DEPARTMENT_OTHER): Payer: Medicare Other | Admitting: Hematology and Oncology

## 2013-10-28 ENCOUNTER — Telehealth: Payer: Self-pay | Admitting: Hematology and Oncology

## 2013-10-28 VITALS — BP 175/85 | HR 65 | Temp 98.0°F | Resp 18 | Ht 66.0 in | Wt 207.7 lb

## 2013-10-28 DIAGNOSIS — D0511 Intraductal carcinoma in situ of right breast: Secondary | ICD-10-CM

## 2013-10-28 DIAGNOSIS — D0510 Intraductal carcinoma in situ of unspecified breast: Secondary | ICD-10-CM

## 2013-10-28 DIAGNOSIS — Z170 Estrogen receptor positive status [ER+]: Secondary | ICD-10-CM

## 2013-10-28 LAB — COMPREHENSIVE METABOLIC PANEL (CC13)
ALT: 45 U/L (ref 0–55)
AST: 32 U/L (ref 5–34)
Albumin: 3.8 g/dL (ref 3.5–5.0)
Alkaline Phosphatase: 119 U/L (ref 40–150)
Anion Gap: 8 mEq/L (ref 3–11)
BILIRUBIN TOTAL: 0.55 mg/dL (ref 0.20–1.20)
BUN: 18.7 mg/dL (ref 7.0–26.0)
CO2: 27 mEq/L (ref 22–29)
Calcium: 10 mg/dL (ref 8.4–10.4)
Chloride: 108 mEq/L (ref 98–109)
Creatinine: 0.7 mg/dL (ref 0.6–1.1)
Glucose: 99 mg/dl (ref 70–140)
Potassium: 4.4 mEq/L (ref 3.5–5.1)
Sodium: 144 mEq/L (ref 136–145)
Total Protein: 6.9 g/dL (ref 6.4–8.3)

## 2013-10-28 LAB — CBC WITH DIFFERENTIAL/PLATELET
BASO%: 1.3 % (ref 0.0–2.0)
Basophils Absolute: 0.1 10*3/uL (ref 0.0–0.1)
EOS%: 5.3 % (ref 0.0–7.0)
Eosinophils Absolute: 0.5 10*3/uL (ref 0.0–0.5)
HCT: 45.8 % (ref 34.8–46.6)
HGB: 15.2 g/dL (ref 11.6–15.9)
LYMPH%: 29.4 % (ref 14.0–49.7)
MCH: 31.6 pg (ref 25.1–34.0)
MCHC: 33.1 g/dL (ref 31.5–36.0)
MCV: 95.6 fL (ref 79.5–101.0)
MONO#: 0.7 10*3/uL (ref 0.1–0.9)
MONO%: 8.1 % (ref 0.0–14.0)
NEUT%: 55.9 % (ref 38.4–76.8)
NEUTROS ABS: 4.8 10*3/uL (ref 1.5–6.5)
Platelets: 170 10*3/uL (ref 145–400)
RBC: 4.79 10*6/uL (ref 3.70–5.45)
RDW: 12.9 % (ref 11.2–14.5)
WBC: 8.7 10*3/uL (ref 3.9–10.3)
lymph#: 2.5 10*3/uL (ref 0.9–3.3)

## 2013-10-28 NOTE — Telephone Encounter (Signed)
, °

## 2013-10-28 NOTE — Assessment & Plan Note (Signed)
1. DCIS and a small focus of invasive ductal carcinoma of right breast with a small focus of invasive cancer, 4 SLN negative ER positive PR positive HER-2 negative T1 A. N0 M0 stage IA, 0.2 cm focus  Patient is currently on letrozole 2.5 mg by mouth daily and is tolerating it fairly well without any major problems or concerns. Last year she undergo physical therapy which seemed to improve her mobility and strength.  2. Surveillance: Annual mammograms and breast exams are recommended. She had a mammogram done June 2015 which was normal. I reviewed the blood work from today which was also normal. Her LFTs which are slightly abnormal now became normal.  3. Survivorship:Discussed the importance of physical exercise in decreasing the likelihood of breast cancer recurrence. Recommended 30 mins daily 6 days a week of either brisk walking or cycling or swimming. Encouraged patient to eat more fruits and vegetables and decrease red meat.

## 2013-10-28 NOTE — Progress Notes (Signed)
Patient Care Team: Marletta Lor, MD as PCP - General  DIAGNOSIS: DCIS (ductal carcinoma in situ) of breast   Primary site: Breast   Staging method: AJCC 7th Edition   Clinical: (Tis (DCIS), NX, cM0)   Summary: (Tis (DCIS), NX, cM0)  DIAGNOSIS: DCIS right breast along with small focus of invasive ductal carcinoma T1, N0, M0 stage IA ER/PR positive HER-2 -4 SLN negative  PRIOR THERAPY:  #1 patient is now status post simple mastectomy with sentinel node biopsy for a fairly extensive DCIS of the right breast.  #2 patient underwent immediate reconstruction with a tissue expander at the same time  #3 her final pathology revealed a microscopic foci of invasive ductal carcinoma with negative nodes.   CURRENT THERAPY: To begin letrozole 2.5 mg daily  CHIEF COMPLIANT: Six-month followup of DCIS and breast cancer  INTERVAL HISTORY: Selena [name] is a 78 year old Caucasian with above-mentioned history of right breast DCIS which was later found to be invasive ductal carcinoma after mastectomy. She immediate reconstruction followed by adjuvant antiestrogen therapy. She is currently on letrozole 2.5 mg daily tolerating it very well without any major problems. She had recent mammogram June 2015 which is normal. She reports no major problems tolerating letrozole. She also had a recent bone density test. A year ago she started to have some stiffness in the arm for which she underwent physical therapy and after 4 episodes of a markedly better.  REVIEW OF SYSTEMS:   Constitutional: Denies fevers, chills or abnormal weight loss Eyes: Denies blurriness of vision Ears, nose, mouth, throat, and face: Denies mucositis or sore throat Respiratory: Denies cough, dyspnea or wheezes Cardiovascular: Denies palpitation, chest discomfort or lower extremity swelling Gastrointestinal:  Denies nausea, heartburn or change in bowel habits Skin: Denies abnormal skin rashes Lymphatics: Denies new lymphadenopathy or  easy bruising Neurological:Denies numbness, tingling or new weaknesses Behavioral/Psych: Mood is stable, no new changes  Breast:  denies any pain or lumps or nodules in  left breast All other systems were reviewed with the patient and are negative.  I have reviewed the past medical history, past surgical history, social history and family history with the patient and they are unchanged from previous note.  ALLERGIES:  is allergic to codeine and codeine phosphate.  MEDICATIONS:  Current Outpatient Prescriptions  Medication Sig Dispense Refill  . aspirin 81 MG tablet Take 81 mg by mouth daily.      . cholecalciferol (VITAMIN D) 1000 UNITS tablet Take 1,000 Units by mouth daily.      . diclofenac (VOLTAREN) 75 MG EC tablet TAKE 1 TABLET BY MOUTH TWICE DAILY  180 tablet  3  . letrozole (FEMARA) 2.5 MG tablet Take 1 tablet (2.5 mg total) by mouth daily.  90 tablet  4  . promethazine (PHENERGAN) 50 MG tablet Take 1 tablet (50 mg total) by mouth every 6 (six) hours as needed for nausea.  30 tablet  2  . UNABLE TO FIND Rx: L8000- Post Surgical Bras (Quantity: 6) G2694- Silicone Breast Prosthesis (Quantity: 1) Dx: 174.9; Right Mastectomy  1 each  0   No current facility-administered medications for this visit.    PHYSICAL EXAMINATION: ECOG PERFORMANCE STATUS: 0 - Asymptomatic  Filed Vitals:   10/28/13 1045  BP: 175/85  Pulse: 65  Temp: 98 F (36.7 C)  Resp: 18   Filed Weights   10/28/13 1045  Weight: 207 lb 11.2 oz (94.212 kg)    GENERAL:alert, no distress and comfortable SKIN: skin color, texture,  turgor are normal, no rashes or significant lesions EYES: normal, Conjunctiva are pink and non-injected, sclera clear OROPHARYNX:no exudate, no erythema and lips, buccal mucosa, and tongue normal  NECK: supple, thyroid normal size, non-tender, without nodularity LYMPH:  no palpable lymphadenopathy in the cervical, axillary or inguinal LUNGS: clear to auscultation and percussion with  normal breathing effort HEART: regular rate & rhythm and no murmurs and no lower extremity edema ABDOMEN:abdomen soft, non-tender and normal bowel sounds Musculoskeletal:no cyanosis of digits and no clubbing  NEURO: alert & oriented x 3 with fluent speech, no focal motor/sensory deficits BREAST: No palpable masses or nodules in either right or left breasts. No palpable axillary supraclavicular or infraclavicular adenopathy no breast tenderness or nipple discharge.   LABORATORY DATA:  I have reviewed the data as listed   Chemistry      Component Value Date/Time   NA 144 10/28/2013 1016   NA 141 08/12/2013 0923   K 4.4 10/28/2013 1016   K 4.2 08/12/2013 0923   CL 106 08/12/2013 0923   CL 107 04/05/2012 1253   CO2 27 10/28/2013 1016   CO2 27 08/12/2013 0923   BUN 18.7 10/28/2013 1016   BUN 20 08/12/2013 0923   CREATININE 0.7 10/28/2013 1016   CREATININE 0.6 08/12/2013 0923      Component Value Date/Time   CALCIUM 10.0 10/28/2013 1016   CALCIUM 9.8 08/12/2013 0923   ALKPHOS 119 10/28/2013 1016   ALKPHOS 106 08/12/2013 0923   AST 32 10/28/2013 1016   AST 42* 08/12/2013 0923   ALT 45 10/28/2013 1016   ALT 52* 08/12/2013 0923   BILITOT 0.55 10/28/2013 1016   BILITOT 0.7 08/12/2013 0923       Lab Results  Component Value Date   WBC 8.7 10/28/2013   HGB 15.2 10/28/2013   HCT 45.8 10/28/2013   MCV 95.6 10/28/2013   PLT 170 10/28/2013   NEUTROABS 4.8 10/28/2013     RADIOGRAPHIC STUDIES: I have personally reviewed the radiology reports and agreed with their findings. No results found.   ASSESSMENT & PLAN:  DCIS (ductal carcinoma in situ) of breast 1. DCIS and a small focus of invasive ductal carcinoma of right breast with a small focus of invasive cancer, 4 SLN negative ER positive PR positive HER-2 negative T1 A. N0 M0 stage IA, 0.2 cm focus  Patient is currently on letrozole 2.5 mg by mouth daily and is tolerating it fairly well without any major problems or concerns. Last year she undergo  physical therapy which seemed to improve her mobility and strength.  2. Surveillance: Annual mammograms and breast exams are recommended. She had a mammogram done June 2015 which was normal. I reviewed the blood work from today which was also normal. Her LFTs which are slightly abnormal now became normal.  3. Survivorship:Discussed the importance of physical exercise in decreasing the likelihood of breast cancer recurrence. Recommended 30 mins daily 6 days a week of either brisk walking or cycling or swimming. Encouraged patient to eat more fruits and vegetables and decrease red meat.     Orders Placed This Encounter  Procedures  . CBC with Differential    Standing Status: Future     Number of Occurrences:      Standing Expiration Date: 10/28/2014  . Lactate dehydrogenase (LDH) - [facility]    Standing Status: Future     Number of Occurrences:      Standing Expiration Date: 10/28/2014   The patient has a good understanding of the  overall plan. she agrees with it. She will call with any problems that may develop before her next visit here.  I spent 10 minutes counseling the patient face to face. The total time spent in the appointment was 15 minutes and more than 50% was on counseling and review of test results    Rulon Eisenmenger, MD 10/28/2013 11:11 AM

## 2014-02-03 ENCOUNTER — Ambulatory Visit (INDEPENDENT_AMBULATORY_CARE_PROVIDER_SITE_OTHER): Payer: Medicare Other | Admitting: Family Medicine

## 2014-02-03 ENCOUNTER — Encounter: Payer: Self-pay | Admitting: Family Medicine

## 2014-02-03 DIAGNOSIS — R300 Dysuria: Secondary | ICD-10-CM

## 2014-02-03 LAB — POCT URINALYSIS DIPSTICK
BILIRUBIN UA: NEGATIVE
Blood, UA: NEGATIVE
GLUCOSE UA: NEGATIVE
Ketones, UA: NEGATIVE
NITRITE UA: NEGATIVE
Protein, UA: NEGATIVE
Spec Grav, UA: 1.015
Urobilinogen, UA: 1
pH, UA: 6.5

## 2014-02-03 MED ORDER — CEPHALEXIN 500 MG PO CAPS: 500.0000 mg | ORAL_CAPSULE | Freq: Three times a day (TID) | ORAL | Status: DC

## 2014-02-03 NOTE — Patient Instructions (Signed)

## 2014-02-03 NOTE — Progress Notes (Signed)
Pre visit review using our clinic review tool, if applicable. No additional management support is needed unless otherwise documented below in the visit note. 

## 2014-02-03 NOTE — Progress Notes (Signed)
   Subjective:    Patient ID: Selena [name], female    DOB: [birth year], 79 y.o.   MRN: [id number]  HPI Acute visit. 4 day history of urine frequency, suprapubic pressure, and occasional mild burning with urination. No fevers or chills. Denies nausea or vomiting. No flank pain. Has had some mild right lower lumbar pain for the past few days. No recent injury.  Reviewed with no change:  Past Medical History  Diagnosis Date  . BACK PAIN, UPPER 07/09/2008  . DIVERTICULOSIS, COLON 12/26/2006  . LIVER FUNCTION TESTS, ABNORMAL, HX OF 12/26/2007  . Memory loss 05/16/2007  . VERTIGO 09/07/2009  . Cataract   . Goiter     multi-nodular  . Hx of colonoscopy 2009  . Wears glasses   . Wears hearing aid     bilateral  . Wears dentures   . Complication of anesthesia   . PONV (postoperative nausea and vomiting)   . Atrial fibrillation 01/01/2007  . Vertigo     hx of;had Phenergan prn   . OSTEOARTHRITIS 12/26/2006    takes Diclofenac daily but has stopped for surgery  . Bruises easily   . Diverticulitis   . Skin cancer   . Breast cancer, stage 1    Past Surgical History  Procedure Laterality Date  . Abdominal hysterectomy    . Rotator cuff repair  1999    right  . Knee surgery  2004/2010    arthroscopic/ bilateral knee replacements  . Cataract extraction w/ intraocular lens  implant, bilateral  2010    bilateral  . Colonoscopy    . Appendectomy    . Ablation of dysrhythmic focus  2009  . Tmi  1998  . Colonoscopy    . Breast biopsy  2013  . Breast reconstruction  04/14/2011    Procedure: BREAST RECONSTRUCTION;  Surgeon: Crissie Reese, MD;  Location: [place];  Service: Plastics;  Laterality: Right;  Placement of Right Breast Tissue Expander with  use of Flex HD for Breast Reconstruction  . Breast surgery      right sm, snbx    reports that she has never smoked. She has never used smokeless tobacco. She reports that she does not drink alcohol or use illicit drugs. family history includes  Cancer in her brother, maternal uncle, mother, and sister; Colon cancer in her maternal aunt and mother; Other in her maternal uncle; Prostate cancer in her maternal uncle and maternal uncle. There is no history of Anesthesia problems, Hypotension, Malignant hyperthermia, or Pseudochol deficiency. Allergies  Allergen Reactions  . Codeine   . Codeine Phosphate       Review of Systems  Constitutional: Negative for fever, chills and appetite change.  Gastrointestinal: Negative for nausea, vomiting, abdominal pain, diarrhea and constipation.  Genitourinary: Positive for dysuria and frequency. Negative for hematuria.  Musculoskeletal: Negative for back pain.  Neurological: Negative for dizziness.       Objective:   Physical Exam  Constitutional: She appears well-developed and well-nourished.  HENT:  Head: Normocephalic and atraumatic.  Neck: Neck supple. No thyromegaly present.  Cardiovascular: Normal rate, regular rhythm and normal heart sounds.   Pulmonary/Chest: Breath sounds normal.  Abdominal: Soft. Bowel sounds are normal. There is no tenderness.          Assessment & Plan:  Dysuria. Suspect uncomplicated cystitis. Urine culture sent. Increase hydration. Keflex 500 mg 3 times a day for 7 days pending culture

## 2014-02-05 LAB — URINE CULTURE: Colony Count: >=100000

## 2014-02-20 ENCOUNTER — Ambulatory Visit (INDEPENDENT_AMBULATORY_CARE_PROVIDER_SITE_OTHER): Payer: Medicare Other | Admitting: Internal Medicine

## 2014-02-20 ENCOUNTER — Encounter: Payer: Self-pay | Admitting: Internal Medicine

## 2014-02-20 VITALS — BP 150/80 | HR 77 | Temp 98.0°F | Resp 20 | Ht 66.0 in | Wt 208.0 lb

## 2014-02-20 DIAGNOSIS — G4700 Insomnia, unspecified: Secondary | ICD-10-CM

## 2014-02-20 DIAGNOSIS — R300 Dysuria: Secondary | ICD-10-CM

## 2014-02-20 DIAGNOSIS — N3000 Acute cystitis without hematuria: Secondary | ICD-10-CM

## 2014-02-20 LAB — POCT URINALYSIS DIPSTICK
Bilirubin, UA: NEGATIVE
Glucose, UA: NEGATIVE
Ketones, UA: NEGATIVE
NITRITE UA: NEGATIVE
Spec Grav, UA: 1.02
Urobilinogen, UA: 0.2
pH, UA: 6

## 2014-02-20 MED ORDER — TRAZODONE HCL 50 MG PO TABS: 50.0000 mg | ORAL_TABLET | Freq: Every evening | ORAL | Status: DC | PRN

## 2014-02-20 MED ORDER — CIPROFLOXACIN HCL 500 MG PO TABS: 500.0000 mg | ORAL_TABLET | Freq: Two times a day (BID) | ORAL | Status: DC

## 2014-02-20 NOTE — Progress Notes (Signed)
Subjective:    Patient ID: Selena [name], female    DOB: Nov 24, 1935, 79 y.o.   MRN: [id number]  HPI 79 year old patient who was treated for an acute UTI about 3 weeks ago.  Symptoms initially improved but the past few days has had recurrent dysuria and frequency. Urinalysis reveals pyuria Urine culture 3 weeks ago revealed Escherichia coli sensitive to cephalexin  She also complains of a worsening insomnia  Past Medical History  Diagnosis Date  . BACK PAIN, UPPER 07/09/2008  . DIVERTICULOSIS, COLON 12/26/2006  . LIVER FUNCTION TESTS, ABNORMAL, HX OF 12/26/2007  . Memory loss 05/16/2007  . VERTIGO 09/07/2009  . Cataract   . Goiter     multi-nodular  . Hx of colonoscopy 2009  . Wears glasses   . Wears hearing aid     bilateral  . Wears dentures   . Complication of anesthesia   . PONV (postoperative nausea and vomiting)   . Atrial fibrillation 01/01/2007  . Vertigo     hx of;had Phenergan prn   . OSTEOARTHRITIS 12/26/2006    takes Diclofenac daily but has stopped for surgery  . Bruises easily   . Diverticulitis   . Skin cancer   . Breast cancer, stage 1     History   Social History  . Marital Status: Widowed    Spouse Name: N/A    Number of Children: N/A  . Years of Education: N/A   Occupational History  . Not on file.   Social History Main Topics  . Smoking status: Never Smoker   . Smokeless tobacco: Never Used  . Alcohol Use: No  . Drug Use: No  . Sexual Activity: Yes    Birth Control/ Protection: Surgical   Other Topics Concern  . Not on file   Social History Narrative    Past Surgical History  Procedure Laterality Date  . Abdominal hysterectomy    . Rotator cuff repair  1999    right  . Knee surgery  2004/2010    arthroscopic/ bilateral knee replacements  . Cataract extraction w/ intraocular lens  implant, bilateral  2010    bilateral  . Colonoscopy    . Appendectomy    . Ablation of dysrhythmic focus  2009  . Tmi  1998  . Colonoscopy    .  Breast biopsy  2013  . Breast reconstruction  04/14/2011    Procedure: BREAST RECONSTRUCTION;  Surgeon: Crissie Reese, MD;  Location: [place];  Service: Plastics;  Laterality: Right;  Placement of Right Breast Tissue Expander with  use of Flex HD for Breast Reconstruction  . Breast surgery      right sm, snbx    Family History  Problem Relation Age of Onset  . Colon cancer Mother   . Cancer Mother     colon  . Colon cancer Maternal Aunt   . Cancer Maternal Uncle   . Prostate cancer Maternal Uncle   . Prostate cancer Maternal Uncle   . Other Maternal Uncle   . Anesthesia problems Neg Hx   . Hypotension Neg Hx   . Malignant hyperthermia Neg Hx   . Pseudochol deficiency Neg Hx   . Cancer Sister     kidney  . Cancer Brother     lymph node    Allergies  Allergen Reactions  . Codeine   . Codeine Phosphate     Current Outpatient Prescriptions on File Prior to Visit  Medication Sig Dispense Refill  . aspirin 81 MG  tablet Take 81 mg by mouth daily.    . cholecalciferol (VITAMIN D) 1000 UNITS tablet Take 1,000 Units by mouth daily.    . diclofenac (VOLTAREN) 75 MG EC tablet TAKE 1 TABLET BY MOUTH TWICE DAILY 180 tablet 3  . letrozole (FEMARA) 2.5 MG tablet Take 1 tablet (2.5 mg total) by mouth daily. 90 tablet 4  . UNABLE TO FIND Rx: L8000- Post Surgical Bras (Quantity: 6) Y5638- Silicone Breast Prosthesis (Quantity: 1) Dx: 174.9; Right Mastectomy 1 each 0   No current facility-administered medications on file prior to visit.    BP 150/80 mmHg  Pulse 77  Temp(Src) 98 F (36.7 C) (Oral)  Resp 20  Ht 5\' 6"  (1.676 m)  Wt 208 lb (94.348 kg)  BMI 33.59 kg/m2  SpO2 98%      Review of Systems  Constitutional: Negative.   HENT: Negative for congestion, dental problem, hearing loss, rhinorrhea, sinus pressure, sore throat and tinnitus.   Eyes: Negative for pain, discharge and visual disturbance.  Respiratory: Negative for cough and shortness of breath.   Cardiovascular:  Negative for chest pain, palpitations and leg swelling.  Gastrointestinal: Negative for nausea, vomiting, abdominal pain, diarrhea, constipation, blood in stool and abdominal distention.  Genitourinary: Positive for dysuria and frequency. Negative for urgency, hematuria, flank pain, vaginal bleeding, vaginal discharge, difficulty urinating, vaginal pain and pelvic pain.  Musculoskeletal: Negative for joint swelling, arthralgias and gait problem.  Skin: Negative for rash.  Neurological: Negative for dizziness, syncope, speech difficulty, weakness, numbness and headaches.  Hematological: Negative for adenopathy.  Psychiatric/Behavioral: Positive for sleep disturbance. Negative for behavioral problems, dysphoric mood and agitation. The patient is not nervous/anxious.        Objective:   Physical Exam  Constitutional: She is oriented to person, place, and time. She appears well-developed and well-nourished.  HENT:  Head: Normocephalic.  Right Ear: External ear normal.  Left Ear: External ear normal.  Mouth/Throat: Oropharynx is clear and moist.  Eyes: Conjunctivae and EOM are normal. Pupils are equal, round, and reactive to light.  Neck: Normal range of motion. Neck supple. No thyromegaly present.  Cardiovascular: Normal rate, regular rhythm, normal heart sounds and intact distal pulses.   Pulmonary/Chest: Effort normal and breath sounds normal.  Abdominal: Soft. Bowel sounds are normal. She exhibits no mass. There is no tenderness.  Musculoskeletal: Normal range of motion.  Lymphadenopathy:    She has no cervical adenopathy.  Neurological: She is alert and oriented to person, place, and time.  Skin: Skin is warm and dry. No rash noted.  Psychiatric: She has a normal mood and affect. Her behavior is normal.          Assessment & Plan:   Recurrent UTI.  Will treat with Cipro for 5 days.  Urine culture will be sent Atrial fibrillation, stable.  Remains in normal sinus  rhythm Insomnia.  Trial trazodone.  Patient information dispensed

## 2014-02-20 NOTE — Progress Notes (Signed)
Pre visit review using our clinic review tool, if applicable. No additional management support is needed unless otherwise documented below in the visit note. 

## 2014-02-20 NOTE — Patient Instructions (Signed)
HOME CARE   If given, take antibiotics as told by your doctor. Finish them even if you start to feel better.  Drink enough fluids to keep your pee (urine) clear or pale yellow.  Avoid tea, drinks with caffeine, and bubbly (carbonated) drinks.  Pee often. Avoid holding your pee in for a long time.  Pee before and after having sex (intercourse).  Wipe from front to back after you poop (bowel movement) if you are a woman. Use each tissue only once.         Insomnia Insomnia is frequent trouble falling and/or staying asleep. Insomnia can be a long term problem or a short term problem. Both are common. Insomnia can be a short term problem when the wakefulness is related to a certain stress or worry. Long term insomnia is often related to ongoing stress during waking hours and/or poor sleeping habits. Overtime, sleep deprivation itself can make the problem worse. Every little thing feels more severe because you are overtired and your ability to cope is decreased. CAUSES   Stress, anxiety, and depression.  Poor sleeping habits.  Distractions such as TV in the bedroom.  Naps close to bedtime.  Engaging in emotionally charged conversations before bed.  Technical reading before sleep.  Alcohol and other sedatives. They may make the problem worse. They can hurt normal sleep patterns and normal dream activity.  Stimulants such as caffeine for several hours prior to bedtime.  Pain syndromes and shortness of breath can cause insomnia.  Exercise late at night.  Changing time zones may cause sleeping problems (jet lag). It is sometimes helpful to have someone observe your sleeping patterns. They should look for periods of not breathing during the night (sleep apnea). They should also look to see how long those periods last. If you live alone or observers are uncertain, you can also be observed at a sleep clinic where your sleep patterns will be professionally monitored. Sleep apnea  requires a checkup and treatment. Give your caregivers your medical history. Give your caregivers observations your family has made about your sleep.  SYMPTOMS   Not feeling rested in the morning.  Anxiety and restlessness at bedtime.  Difficulty falling and staying asleep. TREATMENT   Your caregiver may prescribe treatment for an underlying medical disorders. Your caregiver can give advice or help if you are using alcohol or other drugs for self-medication. Treatment of underlying problems will usually eliminate insomnia problems.  Medications can be prescribed for short time use. They are generally not recommended for lengthy use.  Over-the-counter sleep medicines are not recommended for lengthy use. They can be habit forming.  You can promote easier sleeping by making lifestyle changes such as:  Using relaxation techniques that help with breathing and reduce muscle tension.  Exercising earlier in the day.  Changing your diet and the time of your last meal. No night time snacks.  Establish a regular time to go to bed.  Counseling can help with stressful problems and worry.  Soothing music and white noise may be helpful if there are background noises you cannot remove.  Stop tedious detailed work at least one hour before bedtime. HOME CARE INSTRUCTIONS   Keep a diary. Inform your caregiver about your progress. This includes any medication side effects. See your caregiver regularly. Take note of:  Times when you are asleep.  Times when you are awake during the night.  The quality of your sleep.  How you feel the next day. This information will  help your caregiver care for you.  Get out of bed if you are still awake after 15 minutes. Read or do some quiet activity. Keep the lights down. Wait until you feel sleepy and go back to bed.  Keep regular sleeping and waking hours. Avoid naps.  Exercise regularly.  Avoid distractions at bedtime. Distractions include watching  television or engaging in any intense or detailed activity like attempting to balance the household checkbook.  Develop a bedtime ritual. Keep a familiar routine of bathing, brushing your teeth, climbing into bed at the same time each night, listening to soothing music. Routines increase the success of falling to sleep faster.  Use relaxation techniques. This can be using breathing and muscle tension release routines. It can also include visualizing peaceful scenes. You can also help control troubling or intruding thoughts by keeping your mind occupied with boring or repetitive thoughts like the old concept of counting sheep. You can make it more creative like imagining planting one beautiful flower after another in your backyard garden.  During your day, work to eliminate stress. When this is not possible use some of the previous suggestions to help reduce the anxiety that accompanies stressful situations. MAKE SURE YOU:   Understand these instructions.  Will watch your condition.  Will get help right away if you are not doing well or get worse. Document Released: 01/08/2000 Document Revised: 04/04/2011 Document Reviewed: 02/07/2007 [facility] Patient Information 2015 [place], [place]. This information is not intended to replace advice given to you by your health care provider. Make sure you discuss any questions you have with your health care provider. Urinary Tract Infection A urinary tract infection (UTI) can occur any place along the urinary tract. The tract includes the kidneys, ureters, bladder, and urethra. A type of germ called bacteria often causes a UTI. UTIs are often helped with antibiotic medicine.  HOME CARE   If given, take antibiotics as told by your doctor. Finish them even if you start to feel better.  Drink enough fluids to keep your pee (urine) clear or pale yellow.  Avoid tea, drinks with caffeine, and bubbly (carbonated) drinks.  Pee often. Avoid holding your pee in for  a long time.  Pee before and after having sex (intercourse).  Wipe from front to back after you poop (bowel movement) if you are a woman. Use each tissue only once. GET HELP RIGHT AWAY IF:   You have back pain.  You have lower belly (abdominal) pain.  You have chills.  You feel sick to your stomach (nauseous).  You throw up (vomit).  Your burning or discomfort with peeing does not go away.  You have a fever.  Your symptoms are not better in 3 days. MAKE SURE YOU:   Understand these instructions.  Will watch your condition.  Will get help right away if you are not doing well or get worse. Document Released: 06/29/2007 Document Revised: 10/05/2011 Document Reviewed: 08/11/2011 [facility] Patient Information 2015 [place], [place]. This information is not intended to replace advice given to you by your health care provider. Make sure you discuss any questions you have with your health care provider.

## 2014-02-23 LAB — URINE CULTURE: Colony Count: >=100000

## 2014-03-03 ENCOUNTER — Other Ambulatory Visit: Payer: Self-pay | Admitting: *Deleted

## 2014-03-03 DIAGNOSIS — Z853 Personal history of malignant neoplasm of breast: Secondary | ICD-10-CM

## 2014-03-03 DIAGNOSIS — D0510 Intraductal carcinoma in situ of unspecified breast: Secondary | ICD-10-CM

## 2014-03-03 MED ORDER — LETROZOLE 2.5 MG PO TABS: 2.5000 mg | ORAL_TABLET | Freq: Every day | ORAL | Status: DC

## 2014-03-26 ENCOUNTER — Ambulatory Visit (INDEPENDENT_AMBULATORY_CARE_PROVIDER_SITE_OTHER): Payer: Medicare Other | Admitting: Family Medicine

## 2014-03-26 ENCOUNTER — Encounter: Payer: Self-pay | Admitting: Family Medicine

## 2014-03-26 VITALS — BP 140/82 | HR 72 | Temp 97.8°F | Wt 207.0 lb

## 2014-03-26 DIAGNOSIS — R42 Dizziness and giddiness: Secondary | ICD-10-CM | POA: Diagnosis not present

## 2014-03-26 NOTE — Patient Instructions (Signed)
Vertigo Vertigo means you feel like you or your surroundings are moving when they are not. Vertigo can be dangerous if it occurs when you are at work, driving, or performing difficult activities.  CAUSES  Vertigo occurs when there is a conflict of signals sent to your brain from the visual and sensory systems in your body. There are many different causes of vertigo, including:  Infections, especially in the inner ear.  A bad reaction to a drug or misuse of alcohol and medicines.  Withdrawal from drugs or alcohol.  Rapidly changing positions, such as lying down or rolling over in bed.  A migraine headache.  Decreased blood flow to the brain.  Increased pressure in the brain from a head injury, infection, tumor, or bleeding. SYMPTOMS  You may feel as though the world is spinning around or you are falling to the ground. Because your balance is upset, vertigo can cause nausea and vomiting. You may have involuntary eye movements (nystagmus). DIAGNOSIS  Vertigo is usually diagnosed by physical exam. If the cause of your vertigo is unknown, your caregiver may perform imaging tests, such as an MRI scan (magnetic resonance imaging). TREATMENT  Most cases of vertigo resolve on their own, without treatment. Depending on the cause, your caregiver may prescribe certain medicines. If your vertigo is related to body position issues, your caregiver may recommend movements or procedures to correct the problem. In rare cases, if your vertigo is caused by certain inner ear problems, you may need surgery. HOME CARE INSTRUCTIONS   Follow your caregiver's instructions.  Avoid driving.  Avoid operating heavy machinery.  Avoid performing any tasks that would be dangerous to you or others during a vertigo episode.  Tell your caregiver if you notice that certain medicines seem to be causing your vertigo. Some of the medicines used to treat vertigo episodes can actually make them worse in some people. SEEK  IMMEDIATE MEDICAL CARE IF:   Your medicines do not relieve your vertigo or are making it worse.  You develop problems with talking, walking, weakness, or using your arms, hands, or legs.  You develop severe headaches.  Your nausea or vomiting continues or gets worse.  You develop visual changes.  A family member notices behavioral changes.  Your condition gets worse. MAKE SURE YOU:  Understand these instructions.  Will watch your condition.  Will get help right away if you are not doing well or get worse. Document Released: 10/20/2004 Document Revised: 04/04/2011 Document Reviewed: 07/29/2010 [facility] Patient Information 2015 [place], [place]. This information is not intended to replace advice given to you by your health care provider. Make sure you discuss any questions you have with your health care provider.  Call me by Monday if vertigo not resolving and we can look at vestibular rehab.

## 2014-03-26 NOTE — Progress Notes (Signed)
Subjective:    Patient ID: Selena [name], female    DOB: Aug 23, 1935, 79 y.o.   MRN: [id number]  HPI Acute visit. Patient seen with 3 day history of dizziness. She is describing vertigo symptoms. Her symptoms are worse when she moves to the right side. She had similar vertigo in the past. She was taking some leftover promethazine but this was a high dose of 50 mg she had extreme sedation. She's had slight nausea but no vomiting. She has previously had vestibular rehabilitation which did help. She has chronic hearing loss and has bilateral hearing aids. She denies any recent nasal congestion. Denies any fever, chills, dysphagia, speech changes, or any focal weakness. Symptoms are clearly worse with movement.  Her chronic problems are as below and include history of osteoarthritis and ductal carcinoma in situ of breast as well as past history of atrial fibrillation.  Past Medical History  Diagnosis Date  . BACK PAIN, UPPER 07/09/2008  . DIVERTICULOSIS, COLON 12/26/2006  . LIVER FUNCTION TESTS, ABNORMAL, HX OF 12/26/2007  . Memory loss 05/16/2007  . VERTIGO 09/07/2009  . Cataract   . Goiter     multi-nodular  . Hx of colonoscopy 2009  . Wears glasses   . Wears hearing aid     bilateral  . Wears dentures   . Complication of anesthesia   . PONV (postoperative nausea and vomiting)   . Atrial fibrillation 01/01/2007  . Vertigo     hx of;had Phenergan prn   . OSTEOARTHRITIS 12/26/2006    takes Diclofenac daily but has stopped for surgery  . Bruises easily   . Diverticulitis   . Skin cancer   . Breast cancer, stage 1    Past Surgical History  Procedure Laterality Date  . Abdominal hysterectomy    . Rotator cuff repair  1999    right  . Knee surgery  2004/2010    arthroscopic/ bilateral knee replacements  . Cataract extraction w/ intraocular lens  implant, bilateral  2010    bilateral  . Colonoscopy    . Appendectomy    . Ablation of dysrhythmic focus  2009  . Tmi  1998  .  Colonoscopy    . Breast biopsy  2013  . Breast reconstruction  04/14/2011    Procedure: BREAST RECONSTRUCTION;  Surgeon: Crissie Reese, MD;  Location: [place];  Service: Plastics;  Laterality: Right;  Placement of Right Breast Tissue Expander with  use of Flex HD for Breast Reconstruction  . Breast surgery      right sm, snbx    reports that she has never smoked. She has never used smokeless tobacco. She reports that she does not drink alcohol or use illicit drugs. family history includes Cancer in her brother, maternal uncle, mother, and sister; Colon cancer in her maternal aunt and mother; Other in her maternal uncle; Prostate cancer in her maternal uncle and maternal uncle. There is no history of Anesthesia problems, Hypotension, Malignant hyperthermia, or Pseudochol deficiency. Allergies  Allergen Reactions  . Codeine   . Codeine Phosphate       Review of Systems  Constitutional: Negative for fever, chills, appetite change and unexpected weight change.  Respiratory: Negative for shortness of breath.   Cardiovascular: Negative for chest pain.  Neurological: Positive for dizziness. Negative for weakness and headaches.  Psychiatric/Behavioral: Negative for confusion.       Objective:   Physical Exam  Constitutional: She is oriented to person, place, and time. She appears well-developed and well-nourished.  HENT:  Head: Normocephalic and atraumatic.  Right Ear: External ear normal.  Left Ear: External ear normal.  Neck: Neck supple. No thyromegaly present.  Cardiovascular: Normal rate and regular rhythm.   Pulmonary/Chest: Effort normal and breath sounds normal. No respiratory distress. She has no wheezes. She has no rales.  Neurological: She is alert and oriented to person, place, and time. No cranial nerve deficit. Coordination normal.  No focal weakness. She does have some subtle horizontal nystagmus to the left.  Psychiatric: She has a normal mood and affect. Her behavior is  normal.          Assessment & Plan:  Recurrent vertigo. Suspect benign peripheral positional vertigo. She does not have any red flags for more worrisome vertigo. Her vertigo appears to involve the right side. We've cautioned against high dose of promethazine with sedation. Observe for the next few days. If not resolving by Monday consider repeat referral for vestibular rehabilitation.

## 2014-03-26 NOTE — Progress Notes (Signed)
Pre visit review using our clinic review tool, if applicable. No additional management support is needed unless otherwise documented below in the visit note. 

## 2014-03-27 ENCOUNTER — Ambulatory Visit: Payer: Medicare Other | Admitting: Internal Medicine

## 2014-04-21 ENCOUNTER — Telehealth: Payer: Self-pay

## 2014-04-21 DIAGNOSIS — M5441 Lumbago with sciatica, right side: Secondary | ICD-10-CM | POA: Diagnosis not present

## 2014-04-21 NOTE — Telephone Encounter (Signed)
PLEASE NOTE: All timestamps contained within this report are represented as [place] Standard Time. CONFIDENTIALTY NOTICE: This fax transmission is intended only for the addressee. It contains information that is legally privileged, confidential or otherwise protected from use or disclosure. If you are not the intended recipient, you are strictly prohibited from reviewing, disclosing, copying using or disseminating any of this information or taking any action in reliance on or regarding this information. If you have received this fax in error, please notify us immediately by telephone so that we can arrange for its return to [place]. Phone: [phone], Toll-Free: [phone], Fax: [phone] Page: 1 of 1 Call Id: [id number] [facility] [facility] [place] Patient Name: KATHELINE BRENDLINGER Gender: Female DOB: 01-20-1936 Age: 80 Y 29 M 7 D Return Phone Number: [phone] (Primary) Address: City/State/Zip: [place] [address] Client [facility] - Client Client [facility] Day Physician Simonne Martinet Contact Type Call Caller Name Chamberlain Phone Number [phone] Relationship To Patient Self Is this call to report lab results? No Call Type General Information Initial Comment Caller states he has some pain in his back, when he walks and when he sits it hurts. General Information Type Appointment Nurse Assessment Guidelines Guideline Title Affirmed Question Affirmed Notes Nurse Date/Time (Eastern Time) Disp. Time Eilene Ghazi Time) Disposition Final User 04/21/2014 12:18:59 PM General Information Provided Yes Laney Pastor After Care Instructions Given Call Event Type User Date / Time Description   Pt called office and wanted to know which hospital to go to.  Advised pt that any of the [facility] would be great because Dr. Raliegh Ip can follow pt's progress.   Called  pt and pt went to UC; sciatic nerve pain and pt was given a shot.

## 2014-05-01 ENCOUNTER — Other Ambulatory Visit (HOSPITAL_BASED_OUTPATIENT_CLINIC_OR_DEPARTMENT_OTHER): Payer: Medicare Other

## 2014-05-01 ENCOUNTER — Ambulatory Visit (HOSPITAL_BASED_OUTPATIENT_CLINIC_OR_DEPARTMENT_OTHER): Payer: Medicare Other | Admitting: Hematology and Oncology

## 2014-05-01 ENCOUNTER — Telehealth: Payer: Self-pay | Admitting: Hematology and Oncology

## 2014-05-01 VITALS — BP 166/79 | HR 62 | Temp 98.2°F | Resp 18 | Ht 66.0 in | Wt 207.5 lb

## 2014-05-01 DIAGNOSIS — D0511 Intraductal carcinoma in situ of right breast: Secondary | ICD-10-CM

## 2014-05-01 DIAGNOSIS — C50911 Malignant neoplasm of unspecified site of right female breast: Secondary | ICD-10-CM

## 2014-05-01 LAB — CBC WITH DIFFERENTIAL/PLATELET
BASO%: 0.7 % (ref 0.0–2.0)
BASOS ABS: 0.1 10*3/uL (ref 0.0–0.1)
EOS%: 2.6 % (ref 0.0–7.0)
Eosinophils Absolute: 0.2 10*3/uL (ref 0.0–0.5)
HEMATOCRIT: 46.1 % (ref 34.8–46.6)
HGB: 15.5 g/dL (ref 11.6–15.9)
LYMPH%: 26.6 % (ref 14.0–49.7)
MCH: 32.4 pg (ref 25.1–34.0)
MCHC: 33.6 g/dL (ref 31.5–36.0)
MCV: 96.4 fL (ref 79.5–101.0)
MONO#: 1 10*3/uL — ABNORMAL HIGH (ref 0.1–0.9)
MONO%: 11.7 % (ref 0.0–14.0)
NEUT#: 4.9 10*3/uL (ref 1.5–6.5)
NEUT%: 58.4 % (ref 38.4–76.8)
PLATELETS: 144 10*3/uL — AB (ref 145–400)
RBC: 4.78 10*6/uL (ref 3.70–5.45)
RDW: 13.3 % (ref 11.2–14.5)
WBC: 8.4 10*3/uL (ref 3.9–10.3)
lymph#: 2.2 10*3/uL (ref 0.9–3.3)

## 2014-05-01 LAB — LACTATE DEHYDROGENASE (CC13): LDH: 228 U/L (ref 125–245)

## 2014-05-01 NOTE — Progress Notes (Signed)
Patient Care Team: Marletta Lor, MD as PCP - General  DIAGNOSIS: Breast cancer, right breast   Staging form: Breast, AJCC 7th Edition     Clinical: Tis (DCIS), NX, cM0 - Unsigned   SUMMARY OF ONCOLOGIC HISTORY:   Breast cancer, right breast   04/14/2011 Surgery Right mastectomy: 2 foci of invasive ductal carcinoma 0.2 cm, grade 1 with background extensive DCIS, 0/5 lymph nodes negative,focus 1: ER 94%, PR 100%, Ki-67 5%, HER-2 negative: Focus to ER 100%BR 6% Ki-67 29% HER-2 negative   05/02/2011 -  Anti-estrogen oral therapy letrozole 2.5 mg daily    CHIEF COMPLIANT: Follow-up of breast cancer omeprazole  INTERVAL HISTORY: Selena [name] is a 36 lady with above-mentioned history of right-sided breast cancer underwent mastectomy and is currently on antiestrogen therapy with letrozole for the last 3 years. She is tolerating it fairly well without any major process. Recently she had back spasm which is treated with a local injection. She also has vertigo which is treated with physical therapy. Denies any lumps or nodules in the breasts.  REVIEW OF SYSTEMS:   Constitutional: Denies fevers, chills or abnormal weight loss Eyes: Denies blurriness of vision Ears, nose, mouth, throat, and face: Denies mucositis or sore throat Respiratory: Denies cough, dyspnea or wheezes Cardiovascular: Denies palpitation, chest discomfort or lower extremity swelling Gastrointestinal:  Denies nausea, heartburn or change in bowel habits Skin: Denies abnormal skin rashes Lymphatics: Denies new lymphadenopathy or easy bruising Neurological:Denies numbness, tingling or new weaknesses Behavioral/Psych: Mood is stable, no new changes  Breast:  denies any pain or lumps or nodules in either breasts All other systems were reviewed with the patient and are negative.  I have reviewed the past medical history, past surgical history, social history and family history with the patient and they are unchanged from  previous note.  ALLERGIES:  is allergic to codeine and codeine phosphate.  MEDICATIONS:  Current Outpatient Prescriptions  Medication Sig Dispense Refill  . aspirin 81 MG tablet Take 81 mg by mouth daily.    . cholecalciferol (VITAMIN D) 1000 UNITS tablet Take 1,000 Units by mouth daily.    . cyclobenzaprine (FLEXERIL) 5 MG tablet   0  . diclofenac (VOLTAREN) 75 MG EC tablet TAKE 1 TABLET BY MOUTH TWICE DAILY 180 tablet 3  . letrozole (FEMARA) 2.5 MG tablet Take 1 tablet (2.5 mg total) by mouth daily. 90 tablet 3  . traZODone (DESYREL) 50 MG tablet Take 1 tablet (50 mg total) by mouth at bedtime as needed for sleep. 30 tablet 3  . UNABLE TO FIND Rx: L8000- Post Surgical Bras (Quantity: 6) T0626- Silicone Breast Prosthesis (Quantity: 1) Dx: 174.9; Right Mastectomy 1 each 0   No current facility-administered medications for this visit.    PHYSICAL EXAMINATION: ECOG PERFORMANCE STATUS: 1 - Symptomatic but completely ambulatory  Filed Vitals:   05/01/14 1105  BP: 166/79  Pulse: 62  Temp: 98.2 F (36.8 C)  Resp: 18   Filed Weights   05/01/14 1105  Weight: 207 lb 8 oz (94.121 kg)    GENERAL:alert, no distress and comfortable SKIN: skin color, texture, turgor are normal, no rashes or significant lesions EYES: normal, Conjunctiva are pink and non-injected, sclera clear OROPHARYNX:no exudate, no erythema and lips, buccal mucosa, and tongue normal  NECK: supple, thyroid normal size, non-tender, without nodularity LYMPH:  no palpable lymphadenopathy in the cervical, axillary or inguinal LUNGS: clear to auscultation and percussion with normal breathing effort HEART: regular rate & rhythm and no  murmurs and no lower extremity edema ABDOMEN:abdomen soft, non-tender and normal bowel sounds Musculoskeletal:no cyanosis of digits and no clubbing  NEURO: alert & oriented x 3 with fluent speech, no focal motor/sensory deficits BREAST: No palpable masses or nodules in 2. breasts. No  palpable axillary supraclavicular or infraclavicular adenopathy no breast tenderness or nipple discharge. (exam performed in the presence of a chaperone)  LABORATORY DATA:  I have reviewed the data as listed   Chemistry      Component Value Date/Time   NA 144 10/28/2013 1016   NA 141 08/12/2013 0923   K 4.4 10/28/2013 1016   K 4.2 08/12/2013 0923   CL 106 08/12/2013 0923   CL 107 04/05/2012 1253   CO2 27 10/28/2013 1016   CO2 27 08/12/2013 0923   BUN 18.7 10/28/2013 1016   BUN 20 08/12/2013 0923   CREATININE 0.7 10/28/2013 1016   CREATININE 0.6 08/12/2013 0923      Component Value Date/Time   CALCIUM 10.0 10/28/2013 1016   CALCIUM 9.8 08/12/2013 0923   ALKPHOS 119 10/28/2013 1016   ALKPHOS 106 08/12/2013 0923   AST 32 10/28/2013 1016   AST 42* 08/12/2013 0923   ALT 45 10/28/2013 1016   ALT 52* 08/12/2013 0923   BILITOT 0.55 10/28/2013 1016   BILITOT 0.7 08/12/2013 0923       Lab Results  Component Value Date   WBC 8.4 05/01/2014   HGB 15.5 05/01/2014   HCT 46.1 05/01/2014   MCV 96.4 05/01/2014   PLT 144* 05/01/2014   NEUTROABS 4.9 05/01/2014    ASSESSMENT & PLAN:  Breast cancer, right breast 1. DCIS and a small focus of invasive ductal carcinoma of right breast with a small focus of invasive cancer, 4 SLN negative ER positive PR positive HER-2 negative T1 A. N0 M0 stage IA, 0.2 cm focus  Patient is currently on letrozole 2.5 mg by mouth daily started April 2013 and is tolerating it fairly well without any major problems or concerns.   2. Surveillance:  1. Breast exam 05/01/2014 is normal  2. Annual mammograms done June 2015 which was normal.  3. Letrozole toxicities:No major side effects to letrozole Bone mineral density done June 2015 was normal T score -1 I recommended continued calcium with vitamin D  Return to clinic in 6 months for follow-up    No orders of the defined types were placed in this encounter.   The patient has a good understanding of  the overall plan. she agrees with it. She will call with any problems that may develop before her next visit here.   Rulon Eisenmenger, MD

## 2014-05-01 NOTE — Assessment & Plan Note (Addendum)
1. DCIS and a small focus of invasive ductal carcinoma of right breast with a small focus of invasive cancer, 4 SLN negative ER positive PR positive HER-2 negative T1 A. N0 M0 stage IA, 0.2 cm focus  Patient is currently on letrozole 2.5 mg by mouth daily started April 2013 and is tolerating it fairly well without any major problems or concerns.   2. Surveillance:  1. Breast exam 05/01/2014 is normal  2. Annual mammograms done June 2015 which was normal.  3. Letrozole toxicities:No major side effects to letrozole Bone mineral density done June 2015 was normal T score -1 I recommended continued calcium with vitamin D  Return to clinic in 6 months for follow-up

## 2014-05-01 NOTE — Telephone Encounter (Signed)
Called and left a message with her one year appointment

## 2014-05-12 ENCOUNTER — Ambulatory Visit (INDEPENDENT_AMBULATORY_CARE_PROVIDER_SITE_OTHER): Payer: Medicare Other | Admitting: Adult Health

## 2014-05-12 ENCOUNTER — Encounter: Payer: Self-pay | Admitting: Adult Health

## 2014-05-12 VITALS — BP 162/82 | Temp 97.9°F | Wt 208.6 lb

## 2014-05-12 DIAGNOSIS — R300 Dysuria: Secondary | ICD-10-CM | POA: Diagnosis not present

## 2014-05-12 DIAGNOSIS — N3000 Acute cystitis without hematuria: Secondary | ICD-10-CM

## 2014-05-12 LAB — POCT URINALYSIS DIPSTICK
BILIRUBIN UA: NEGATIVE
GLUCOSE UA: NEGATIVE
KETONES UA: NEGATIVE
Nitrite, UA: NEGATIVE
PROTEIN UA: NEGATIVE
Spec Grav, UA: 1.02
Urobilinogen, UA: 0.2
pH, UA: 7

## 2014-05-12 MED ORDER — CIPROFLOXACIN HCL 500 MG PO TABS: 500.0000 mg | ORAL_TABLET | Freq: Two times a day (BID) | ORAL | Status: DC

## 2014-05-12 MED ORDER — PHENAZOPYRIDINE HCL 200 MG PO TABS: 200.0000 mg | ORAL_TABLET | Freq: Three times a day (TID) | ORAL | Status: DC | PRN

## 2014-05-12 NOTE — Patient Instructions (Signed)

## 2014-05-12 NOTE — Progress Notes (Signed)
Subjective:    Patient ID: Selena [name], female    DOB: 11-17-1935, 79 y.o.   MRN: [id number]  HPI Patient presents to the office with UTI type symptoms. Her symptoms started on Saturday night with frequency, burning with urination and back pain. Denies N/V/D/fevers. No abdominal pain. Her last UTI was 02/20/2014. She took Cipro last time and tolerated well.    Review of Systems  Constitutional: Negative for fever, chills, activity change, appetite change, fatigue and unexpected weight change.  Genitourinary: Positive for dysuria, urgency, frequency and flank pain. Negative for vaginal discharge, difficulty urinating, vaginal pain and dyspareunia.  All other systems reviewed and are negative.  Past Medical History  Diagnosis Date  . BACK PAIN, UPPER 07/09/2008  . DIVERTICULOSIS, COLON 12/26/2006  . LIVER FUNCTION TESTS, ABNORMAL, HX OF 12/26/2007  . Memory loss 05/16/2007  . VERTIGO 09/07/2009  . Cataract   . Goiter     multi-nodular  . Hx of colonoscopy 2009  . Wears glasses   . Wears hearing aid     bilateral  . Wears dentures   . Complication of anesthesia   . PONV (postoperative nausea and vomiting)   . Atrial fibrillation 01/01/2007  . Vertigo     hx of;had Phenergan prn   . OSTEOARTHRITIS 12/26/2006    takes Diclofenac daily but has stopped for surgery  . Bruises easily   . Diverticulitis   . Skin cancer   . Breast cancer, stage 1     History   Social History  . Marital Status: Widowed    Spouse Name: N/A  . Number of Children: N/A  . Years of Education: N/A   Occupational History  . Not on file.   Social History Main Topics  . Smoking status: Never Smoker   . Smokeless tobacco: Never Used  . Alcohol Use: No  . Drug Use: No  . Sexual Activity: Yes    Birth Control/ Protection: Surgical   Other Topics Concern  . Not on file   Social History Narrative    Past Surgical History  Procedure Laterality Date  . Abdominal hysterectomy    . Rotator cuff  repair  1999    right  . Knee surgery  2004/2010    arthroscopic/ bilateral knee replacements  . Cataract extraction w/ intraocular lens  implant, bilateral  2010    bilateral  . Colonoscopy    . Appendectomy    . Ablation of dysrhythmic focus  2009  . Tmi  1998  . Colonoscopy    . Breast biopsy  2013  . Breast reconstruction  04/14/2011    Procedure: BREAST RECONSTRUCTION;  Surgeon: Crissie Reese, MD;  Location: [facility];  Service: Plastics;  Laterality: Right;  Placement of Right Breast Tissue Expander with  use of Flex HD for Breast Reconstruction  . Breast surgery      right sm, snbx    Family History  Problem Relation Age of Onset  . Colon cancer Mother   . Cancer Mother     colon  . Colon cancer Maternal Aunt   . Cancer Maternal Uncle   . Prostate cancer Maternal Uncle   . Prostate cancer Maternal Uncle   . Other Maternal Uncle   . Anesthesia problems Neg Hx   . Hypotension Neg Hx   . Malignant hyperthermia Neg Hx   . Pseudochol deficiency Neg Hx   . Cancer Sister     kidney  . Cancer Brother  lymph node    Allergies  Allergen Reactions  . Codeine   . Codeine Phosphate     Current Outpatient Prescriptions on File Prior to Visit  Medication Sig Dispense Refill  . aspirin 81 MG tablet Take 81 mg by mouth daily.    . cholecalciferol (VITAMIN D) 1000 UNITS tablet Take 1,000 Units by mouth daily.    . diclofenac (VOLTAREN) 75 MG EC tablet TAKE 1 TABLET BY MOUTH TWICE DAILY 180 tablet 3  . letrozole (FEMARA) 2.5 MG tablet Take 1 tablet (2.5 mg total) by mouth daily. 90 tablet 3  . traZODone (DESYREL) 50 MG tablet Take 1 tablet (50 mg total) by mouth at bedtime as needed for sleep. 30 tablet 3  . UNABLE TO FIND Rx: L8000- Post Surgical Bras (Quantity: 6) O6767- Silicone Breast Prosthesis (Quantity: 1) Dx: 174.9; Right Mastectomy 1 each 0  . cyclobenzaprine (FLEXERIL) 5 MG tablet   0   No current facility-administered medications on file prior to visit.    BP  162/82 mmHg  Temp(Src) 97.9 F (36.6 C) (Oral)  Wt 208 lb 9.6 oz (94.62 kg)       Objective:   Physical Exam  Constitutional: She is oriented to person, place, and time. She appears well-developed and well-nourished. No distress.  Cardiovascular: Normal rate, regular rhythm and normal heart sounds.  Exam reveals no gallop and no friction rub.   No murmur heard. Pulmonary/Chest: Effort normal and breath sounds normal. No respiratory distress. She has no wheezes. She has no rales. She exhibits no tenderness.  Abdominal: Soft. Bowel sounds are normal. She exhibits no distension. There is no tenderness.  Genitourinary:  CVA tenderness  Neurological: She is alert and oriented to person, place, and time.  Skin: Skin is warm and dry.  Psychiatric: She has a normal mood and affect. Her behavior is normal. Thought content normal.  Nursing note and vitals reviewed.      Assessment & Plan:  1. Dysuria  - POCT urinalysis dipstick; Standing - Culture, Urine - POCT urinalysis dipstick  2. Acute cystitis without hematuria - Cipro 500mg  BID x 5 days prescribed - Pyridium 200mg  TID x 3 days prescribed - Will follow up with patient if ABX needs to be changed depending on culture results - Follow up as needed

## 2014-05-15 LAB — URINE CULTURE: Colony Count: >=100000

## 2014-05-26 ENCOUNTER — Other Ambulatory Visit: Payer: Self-pay

## 2014-05-26 DIAGNOSIS — Z9011 Acquired absence of right breast and nipple: Secondary | ICD-10-CM

## 2014-05-26 DIAGNOSIS — Z1231 Encounter for screening mammogram for malignant neoplasm of breast: Secondary | ICD-10-CM

## 2014-06-16 ENCOUNTER — Other Ambulatory Visit: Payer: Self-pay | Admitting: *Deleted

## 2014-06-16 ENCOUNTER — Ambulatory Visit (INDEPENDENT_AMBULATORY_CARE_PROVIDER_SITE_OTHER): Payer: Medicare Other | Admitting: Internal Medicine

## 2014-06-16 DIAGNOSIS — C50911 Malignant neoplasm of unspecified site of right female breast: Secondary | ICD-10-CM | POA: Diagnosis not present

## 2014-06-16 DIAGNOSIS — M5489 Other dorsalgia: Secondary | ICD-10-CM

## 2014-06-16 DIAGNOSIS — M159 Polyosteoarthritis, unspecified: Secondary | ICD-10-CM

## 2014-06-16 DIAGNOSIS — M150 Primary generalized (osteo)arthritis: Secondary | ICD-10-CM | POA: Diagnosis not present

## 2014-06-16 MED ORDER — DICLOFENAC SODIUM 75 MG PO TBEC: DELAYED_RELEASE_TABLET | ORAL | Status: DC

## 2014-06-16 MED ORDER — METHYLPREDNISOLONE ACETATE 80 MG/ML IJ SUSP
80.0000 mg | Freq: Once | INTRAMUSCULAR | Status: AC
  Administered 2014-06-16: 80 mg via INTRAMUSCULAR

## 2014-06-16 NOTE — Addendum Note (Signed)
Addended by: Marian Sorrow on: 06/16/2014 08:40 AM   Modules accepted: Orders

## 2014-06-16 NOTE — Patient Instructions (Signed)
Back Injury Prevention Back injuries can be extremely painful and difficult to heal. After having one back injury, you are much more likely to experience another later on. It is important to learn how to avoid injuring or re-injuring your back. The following tips can help you to prevent a back injury. PHYSICAL FITNESS  Exercise regularly and try to develop good tone in your abdominal muscles. Your abdominal muscles provide a lot of the support needed by your back.  Do aerobic exercises (walking, jogging, biking, swimming) regularly.  Do exercises that increase balance and strength (tai chi, yoga) regularly. This can decrease your risk of falling and injuring your back.  Stretch before and after exercising.  Maintain a healthy weight. The more you weigh, the more stress is placed on your back. For every pound of weight, 10 times that amount of pressure is placed on the back. DIET  Talk to your caregiver about how much calcium and vitamin D you need per day. These nutrients help to prevent weakening of the bones (osteoporosis). Osteoporosis can cause broken (fractured) bones that lead to back pain.  Include good sources of calcium in your diet, such as dairy products, green, leafy vegetables, and products with calcium added (fortified).  Include good sources of vitamin D in your diet, such as milk and foods that are fortified with vitamin D.  Consider taking a nutritional supplement or a multivitamin if needed.  Stop smoking if you smoke. POSTURE  Sit and stand up straight. Avoid leaning forward when you sit or hunching over when you stand.  Choose chairs with good low back (lumbar) support.  If you work at a desk, sit close to your work so you do not need to lean over. Keep your chin tucked in. Keep your neck drawn back and elbows bent at a right angle. Your arms should look like the letter "L."  Sit high and close to the steering wheel when you drive. Add a lumbar support to your car  seat if needed.  Avoid sitting or standing in one position for too long. Take breaks to get up, stretch, and walk around at least once every hour. Take breaks if you are driving for long periods of time.  Sleep on your side with your knees slightly bent, or sleep on your back with a pillow under your knees. Do not sleep on your stomach. LIFTING, TWISTING, AND REACHING  Avoid heavy lifting, especially repetitive lifting. If you must do heavy lifting:  Stretch before lifting.  Work slowly.  Rest between lifts.  Use carts and dollies to move objects when possible.  Make several small trips instead of carrying 1 heavy load.  Ask for help when you need it.  Ask for help when moving big, awkward objects.  Follow these steps when lifting:  Stand with your feet shoulder-width apart.  Get as close to the object as you can. Do not try to pick up heavy objects that are far from your body.  Use handles or lifting straps if they are available.  Bend at your knees. Squat down, but keep your heels off the floor.  Keep your shoulders pulled back, your chin tucked in, and your back straight.  Lift the object slowly, tightening the muscles in your legs, abdomen, and buttocks. Keep the object as close to the center of your body as possible.  When you put a load down, use these same guidelines in reverse.  Do not:  Lift the object above your waist.    Twist at the waist while lifting or carrying a load. Move your feet if you need to turn, not your waist.  Bend over without bending at your knees.  Avoid reaching over your head, across a table, or for an object on a high surface. OTHER TIPS  Avoid wet floors and keep sidewalks clear of ice to prevent falls.  Do not sleep on a mattress that is too soft or too hard.  Keep items that are used frequently within easy reach.  Put heavier objects on shelves at waist level and lighter objects on lower or higher shelves.  Find ways to  decrease your stress, such as exercise, massage, or relaxation techniques. Stress can build up in your muscles. Tense muscles are more vulnerable to injury.  Seek treatment for depression or anxiety if needed. These conditions can increase your risk of developing back pain. SEEK MEDICAL CARE IF:  You injure your back.  You have questions about diet, exercise, or other ways to prevent back injuries. MAKE SURE YOU:  Understand these instructions.  Will watch your condition.  Will get help right away if you are not doing well or get worse. Document Released: 02/18/2004 Document Revised: 04/04/2011 Document Reviewed: 02/21/2011 [facility] Patient Information 2015 [place], [place]. This information is not intended to replace advice given to you by your health care provider. Make sure you discuss any questions you have with your health care provider. Sciatica Sciatica is pain, weakness, numbness, or tingling along the path of the sciatic nerve. The nerve starts in the lower back and runs down the back of each leg. The nerve controls the muscles in the lower leg and in the back of the knee, while also providing sensation to the back of the thigh, lower leg, and the sole of your foot. Sciatica is a symptom of another medical condition. For instance, nerve damage or certain conditions, such as a herniated disk or bone spur on the spine, pinch or put pressure on the sciatic nerve. This causes the pain, weakness, or other sensations normally associated with sciatica. Generally, sciatica only affects one side of the body. CAUSES   Herniated or slipped disc.  Degenerative disk disease.  A pain disorder involving the narrow muscle in the buttocks (piriformis syndrome).  Pelvic injury or fracture.  Pregnancy.  Tumor (rare). SYMPTOMS  Symptoms can vary from mild to very severe. The symptoms usually travel from the low back to the buttocks and down the back of the leg. Symptoms can include:  Mild  tingling or dull aches in the lower back, leg, or hip.  Numbness in the back of the calf or sole of the foot.  Burning sensations in the lower back, leg, or hip.  Sharp pains in the lower back, leg, or hip.  Leg weakness.  Severe back pain inhibiting movement. These symptoms may get worse with coughing, sneezing, laughing, or prolonged sitting or standing. Also, being overweight may worsen symptoms. DIAGNOSIS  Your caregiver will perform a physical exam to look for common symptoms of sciatica. He or she may ask you to do certain movements or activities that would trigger sciatic nerve pain. Other tests may be performed to find the cause of the sciatica. These may include:  Blood tests.  X-rays.  Imaging tests, such as an MRI or CT scan. TREATMENT  Treatment is directed at the cause of the sciatic pain. Sometimes, treatment is not necessary and the pain and discomfort goes away on its own. If treatment is needed, your  caregiver may suggest:  Over-the-counter medicines to relieve pain.  Prescription medicines, such as anti-inflammatory medicine, muscle relaxants, or narcotics.  Applying heat or ice to the painful area.  Steroid injections to lessen pain, irritation, and inflammation around the nerve.  Reducing activity during periods of pain.  Exercising and stretching to strengthen your abdomen and improve flexibility of your spine. Your caregiver may suggest losing weight if the extra weight makes the back pain worse.  Physical therapy.  Surgery to eliminate what is pressing or pinching the nerve, such as a bone spur or part of a herniated disk. HOME CARE INSTRUCTIONS   Only take over-the-counter or prescription medicines for pain or discomfort as directed by your caregiver.  Apply ice to the affected area for 20 minutes, 3-4 times a day for the first 48-72 hours. Then try heat in the same way.  Exercise, stretch, or perform your usual activities if these do not aggravate  your pain.  Attend physical therapy sessions as directed by your caregiver.  Keep all follow-up appointments as directed by your caregiver.  Do not wear high heels or shoes that do not provide proper support.  Check your mattress to see if it is too soft. A firm mattress may lessen your pain and discomfort. SEEK IMMEDIATE MEDICAL CARE IF:   You lose control of your bowel or bladder (incontinence).  You have increasing weakness in the lower back, pelvis, buttocks, or legs.  You have redness or swelling of your back.  You have a burning sensation when you urinate.  You have pain that gets worse when you lie down or awakens you at night.  Your pain is worse than you have experienced in the past.  Your pain is lasting longer than 4 weeks.  You are suddenly losing weight without reason. MAKE SURE YOU:  Understand these instructions.  Will watch your condition.  Will get help right away if you are not doing well or get worse. Document Released: 01/04/2001 Document Revised: 07/12/2011 Document Reviewed: 05/22/2011 [employer] Patient Information 2015 [employer]. This information is not intended to replace advice given to you by your health care provider. Make sure you discuss any questions you have with your health care provider.  

## 2014-06-16 NOTE — Progress Notes (Signed)
Pre visit review using our clinic review tool, if applicable. No additional management support is needed unless otherwise documented below in the visit note. 

## 2014-06-16 NOTE — Progress Notes (Signed)
Subjective:    Patient ID: Selena [name], female    DOB: [birth year], 79 y.o.   MRN: [id number]  HPI  79 year old patient who has a history of osteoarthritis and low back pain.  She has a history of right breast cancer and has been seen recently by oncology. Back pain started 2 months ago after a round of golf with radiation to the right buttock and down to the right calf area.  She was seen at an urgent care and responded to a cortisone injection.  She attempted another round of golf and has had recurrent pain with radiation to the right buttock and right knee No constitutional complaints  Past Medical History  Diagnosis Date  . BACK PAIN, UPPER 07/09/2008  . DIVERTICULOSIS, COLON 12/26/2006  . LIVER FUNCTION TESTS, ABNORMAL, HX OF 12/26/2007  . Memory loss 05/16/2007  . VERTIGO 09/07/2009  . Cataract   . Goiter     multi-nodular  . Hx of colonoscopy 2009  . Wears glasses   . Wears hearing aid     bilateral  . Wears dentures   . Complication of anesthesia   . PONV (postoperative nausea and vomiting)   . Atrial fibrillation 01/01/2007  . Vertigo     hx of;had Phenergan prn   . OSTEOARTHRITIS 12/26/2006    takes Diclofenac daily but has stopped for surgery  . Bruises easily   . Diverticulitis   . Skin cancer   . Breast cancer, stage 1     History   Social History  . Marital Status: Widowed    Spouse Name: N/A  . Number of Children: N/A  . Years of Education: N/A   Occupational History  . Not on file.   Social History Main Topics  . Smoking status: Never Smoker   . Smokeless tobacco: Never Used  . Alcohol Use: No  . Drug Use: No  . Sexual Activity: Yes    Birth Control/ Protection: Surgical   Other Topics Concern  . Not on file   Social History Narrative    Past Surgical History  Procedure Laterality Date  . Abdominal hysterectomy    . Rotator cuff repair  1999    right  . Knee surgery  2004/2010    arthroscopic/ bilateral knee replacements  . Cataract  extraction w/ intraocular lens  implant, bilateral  2010    bilateral  . Colonoscopy    . Appendectomy    . Ablation of dysrhythmic focus  2009  . Tmi  1998  . Colonoscopy    . Breast biopsy  2013  . Breast reconstruction  04/14/2011    Procedure: BREAST RECONSTRUCTION;  Surgeon: Crissie Reese, MD;  Location: [facility];  Service: Plastics;  Laterality: Right;  Placement of Right Breast Tissue Expander with  use of Flex HD for Breast Reconstruction  . Breast surgery      right sm, snbx    Family History  Problem Relation Age of Onset  . Colon cancer Mother   . Cancer Mother     colon  . Colon cancer Maternal Aunt   . Cancer Maternal Uncle   . Prostate cancer Maternal Uncle   . Prostate cancer Maternal Uncle   . Other Maternal Uncle   . Anesthesia problems Neg Hx   . Hypotension Neg Hx   . Malignant hyperthermia Neg Hx   . Pseudochol deficiency Neg Hx   . Cancer Sister     kidney  . Cancer Brother  lymph node    Allergies  Allergen Reactions  . Codeine   . Codeine Phosphate     Current Outpatient Prescriptions on File Prior to Visit  Medication Sig Dispense Refill  . aspirin 81 MG tablet Take 81 mg by mouth daily.    . cholecalciferol (VITAMIN D) 1000 UNITS tablet Take 1,000 Units by mouth daily.    . diclofenac (VOLTAREN) 75 MG EC tablet TAKE 1 TABLET BY MOUTH TWICE DAILY 180 tablet 3  . letrozole (FEMARA) 2.5 MG tablet Take 1 tablet (2.5 mg total) by mouth daily. 90 tablet 3  . UNABLE TO FIND Rx: L8000- Post Surgical Bras (Quantity: 6) K5625- Silicone Breast Prosthesis (Quantity: 1) Dx: 174.9; Right Mastectomy 1 each 0   No current facility-administered medications on file prior to visit.    BP 144/80 mmHg  Pulse 71  Temp(Src) 98.1 F (36.7 C) (Oral)  Resp 20  Ht 5\' 6"  (1.676 m)  Wt 208 lb (94.348 kg)  BMI 33.59 kg/m2  SpO2 98%     Review of Systems  Constitutional: Negative.   HENT: Negative for congestion, dental problem, hearing loss, rhinorrhea,  sinus pressure, sore throat and tinnitus.   Eyes: Negative for pain, discharge and visual disturbance.  Respiratory: Negative for cough and shortness of breath.   Cardiovascular: Negative for chest pain, palpitations and leg swelling.  Gastrointestinal: Negative for nausea, vomiting, abdominal pain, diarrhea, constipation, blood in stool and abdominal distention.  Genitourinary: Negative for dysuria, urgency, frequency, hematuria, flank pain, vaginal bleeding, vaginal discharge, difficulty urinating, vaginal pain and pelvic pain.  Musculoskeletal: Positive for back pain and gait problem. Negative for joint swelling and arthralgias.  Skin: Negative for rash.  Neurological: Negative for dizziness, syncope, speech difficulty, weakness, numbness and headaches.  Hematological: Negative for adenopathy.  Psychiatric/Behavioral: Negative for behavioral problems, dysphoric mood and agitation. The patient is not nervous/anxious.        Objective:   Physical Exam  Constitutional: She appears well-developed and well-nourished. No distress.  Musculoskeletal:  Able to walk on toes and heels Patellar and Achilles reflexes, both blunted Straight leg test positive on the right          Assessment & Plan:   Low back pain with sciatica.  Will repeat with Depo-Medrol.  This was helpful earlier.  Will continue rest and conservative care.  Consider lumbar MRI.  If symptoms fail to improve Osteoarthritis History back pain History of right breast cancer

## 2014-06-24 ENCOUNTER — Telehealth: Payer: Self-pay | Admitting: Internal Medicine

## 2014-06-24 DIAGNOSIS — M5441 Lumbago with sciatica, right side: Secondary | ICD-10-CM

## 2014-06-24 NOTE — Telephone Encounter (Signed)
Please schedule lumbar MRI.

## 2014-06-24 NOTE — Telephone Encounter (Signed)
Spoke to pt told her Dr.K ordered an MRI of lumbar spine and someone will be contacting you with an appointment. Pt verbalized understanding.

## 2014-06-24 NOTE — Telephone Encounter (Signed)
Please see message and advise 

## 2014-06-24 NOTE — Telephone Encounter (Signed)
Pt was seen on 5-23 for osteoarthritis and still having pain. Please advise

## 2014-06-27 ENCOUNTER — Ambulatory Visit
Admission: RE | Admit: 2014-06-27 | Discharge: 2014-06-27 | Disposition: A | Discharge status: Home / Self Care | Payer: Medicare Other | Source: Ambulatory Visit

## 2014-06-27 DIAGNOSIS — Z1231 Encounter for screening mammogram for malignant neoplasm of breast: Secondary | ICD-10-CM | POA: Diagnosis not present

## 2014-06-27 DIAGNOSIS — Z9011 Acquired absence of right breast and nipple: Secondary | ICD-10-CM

## 2014-06-28 ENCOUNTER — Ambulatory Visit
Admission: RE | Admit: 2014-06-28 | Discharge: 2014-06-28 | Disposition: A | Discharge status: Home / Self Care | Payer: Medicare Other | Source: Ambulatory Visit | Attending: Internal Medicine | Admitting: Internal Medicine

## 2014-06-28 DIAGNOSIS — M5441 Lumbago with sciatica, right side: Secondary | ICD-10-CM

## 2014-06-30 ENCOUNTER — Telehealth: Payer: Self-pay | Admitting: Internal Medicine

## 2014-06-30 NOTE — Telephone Encounter (Signed)
Pt states she went for MRI on Sat and they could not do b/c of her breast implant/ Pt wanted you to know. pls advise

## 2014-06-30 NOTE — Telephone Encounter (Signed)
Please see message and advise 

## 2014-07-02 NOTE — Telephone Encounter (Signed)
Pt is aware waiting on md °

## 2014-07-03 ENCOUNTER — Telehealth: Payer: Self-pay | Admitting: Internal Medicine

## 2014-07-03 DIAGNOSIS — M5441 Lumbago with sciatica, right side: Secondary | ICD-10-CM

## 2014-07-03 NOTE — Telephone Encounter (Signed)
Pt call today asking if Dr Raliegh Ip has decided on what the next step will be since he was not able to have the MRI

## 2014-07-03 NOTE — Telephone Encounter (Signed)
Spoke to pt, told her she can have MRI done but has to be done at the hospital. Told pt I put orders back in for MRI Lumbar spine and someone will contact you regarding an appointment. Pt verbalized understanding.

## 2014-07-14 ENCOUNTER — Ambulatory Visit (HOSPITAL_COMMUNITY)
Admission: RE | Admit: 2014-07-14 | Discharge: 2014-07-14 | Disposition: A | Discharge status: Home / Self Care | Payer: Medicare Other | Source: Ambulatory Visit | Attending: Internal Medicine | Admitting: Internal Medicine

## 2014-07-14 ENCOUNTER — Telehealth: Payer: Self-pay | Admitting: *Deleted

## 2014-07-14 DIAGNOSIS — M5136 Other intervertebral disc degeneration, lumbar region: Secondary | ICD-10-CM | POA: Diagnosis not present

## 2014-07-14 DIAGNOSIS — R222 Localized swelling, mass and lump, trunk: Secondary | ICD-10-CM | POA: Diagnosis not present

## 2014-07-14 DIAGNOSIS — M4806 Spinal stenosis, lumbar region: Secondary | ICD-10-CM | POA: Insufficient documentation

## 2014-07-14 DIAGNOSIS — M4696 Unspecified inflammatory spondylopathy, lumbar region: Secondary | ICD-10-CM | POA: Diagnosis not present

## 2014-07-14 DIAGNOSIS — M545 Low back pain: Secondary | ICD-10-CM | POA: Diagnosis present

## 2014-07-14 DIAGNOSIS — M5441 Lumbago with sciatica, right side: Secondary | ICD-10-CM

## 2014-07-14 DIAGNOSIS — M5126 Other intervertebral disc displacement, lumbar region: Secondary | ICD-10-CM | POA: Diagnosis not present

## 2014-07-14 NOTE — Telephone Encounter (Signed)
Received call from [facility], verbal report pt had MRI Lumbar Spine showed Large Retroperitoneal Mass left of Aorta L-2 Level concerned is raised for aggressive or malignant process. Recommend CT Chest, Abdomen and Pelvis with contrast.

## 2014-07-15 ENCOUNTER — Telehealth: Payer: Self-pay | Admitting: Internal Medicine

## 2014-07-15 NOTE — Telephone Encounter (Signed)
Pt returned Dr Raliegh Ip call and would like a call back .

## 2014-07-15 NOTE — Telephone Encounter (Signed)
Late Entry: 07/14/2014 at 3:25 pm Dr. Raliegh Ip notified results of report verbally and message sent to him.

## 2014-07-15 NOTE — Telephone Encounter (Signed)
Dr.K spoke to pt.

## 2014-07-16 ENCOUNTER — Other Ambulatory Visit: Payer: Self-pay | Admitting: *Deleted

## 2014-07-16 ENCOUNTER — Ambulatory Visit (HOSPITAL_BASED_OUTPATIENT_CLINIC_OR_DEPARTMENT_OTHER): Payer: Medicare Other | Admitting: Hematology and Oncology

## 2014-07-16 ENCOUNTER — Other Ambulatory Visit: Payer: Self-pay | Admitting: Internal Medicine

## 2014-07-16 VITALS — BP 176/80 | HR 98 | Temp 98.0°F | Resp 18 | Ht 66.0 in | Wt 204.8 lb

## 2014-07-16 DIAGNOSIS — R222 Localized swelling, mass and lump, trunk: Secondary | ICD-10-CM

## 2014-07-16 DIAGNOSIS — M5440 Lumbago with sciatica, unspecified side: Secondary | ICD-10-CM

## 2014-07-16 DIAGNOSIS — C50911 Malignant neoplasm of unspecified site of right female breast: Secondary | ICD-10-CM

## 2014-07-16 DIAGNOSIS — M25552 Pain in left hip: Secondary | ICD-10-CM

## 2014-07-16 DIAGNOSIS — K669 Disorder of peritoneum, unspecified: Secondary | ICD-10-CM

## 2014-07-16 DIAGNOSIS — D0511 Intraductal carcinoma in situ of right breast: Secondary | ICD-10-CM | POA: Diagnosis not present

## 2014-07-16 DIAGNOSIS — R937 Abnormal findings on diagnostic imaging of other parts of musculoskeletal system: Secondary | ICD-10-CM

## 2014-07-16 DIAGNOSIS — M791 Myalgia: Secondary | ICD-10-CM | POA: Diagnosis not present

## 2014-07-16 DIAGNOSIS — M899 Disorder of bone, unspecified: Secondary | ICD-10-CM | POA: Diagnosis not present

## 2014-07-16 DIAGNOSIS — R1900 Intra-abdominal and pelvic swelling, mass and lump, unspecified site: Secondary | ICD-10-CM

## 2014-07-16 HISTORY — DX: Localized swelling, mass and lump, trunk: R22.2

## 2014-07-16 NOTE — Progress Notes (Signed)
This RN spoke with Jinny Blossom in [place] at [facility] and obtained ok to add pt to schedule tomorrow for scan.  Called to central scheduling and obtained time of 2pm.  Pt will need labs drawn at 1pm.  This RN then called pt's daughter Helene Kelp- obtained identified VM- detailed message left per above appointments.

## 2014-07-16 NOTE — Progress Notes (Signed)
Patient Care Team: Marletta Lor, MD as PCP - General  DIAGNOSIS: Breast cancer, right breast   Staging form: Breast, AJCC 7th Edition     Clinical: Tis (DCIS), NX, cM0 - Unsigned   SUMMARY OF ONCOLOGIC HISTORY:   Breast cancer, right breast   04/14/2011 Surgery Right mastectomy: 2 foci of invasive ductal carcinoma 0.2 cm, grade 1 with background extensive DCIS, 0/5 lymph nodes negative,focus 1: ER 94%, PR 100%, Ki-67 5%, HER-2 negative: Focus to ER 100%BR 6% Ki-67 29% HER-2 negative   05/02/2011 -  Anti-estrogen oral therapy letrozole 2.5 mg daily    CHIEF COMPLIANT: Evaluation of paraspinal retroperitoneal mass  INTERVAL HISTORY: Selena [name] is a 80 year old with above-mentioned history of right breast cancer currently on letrozole therapy and doing quite well. She has had long-standing issues with sciatica and had previously received injections. Recently these injections have failed to improve her left buttock and hip pain. This led to an MRI of her spine which revealed an 8 x 4 cm mass in the retroperitoneum paraspinal area. I was informed of this finding and we are seeing her emergently today for evaluation.  REVIEW OF SYSTEMS:   Constitutional: Denies fevers, chills or abnormal weight loss Eyes: Denies blurriness of vision Ears, nose, mouth, throat, and face: Denies mucositis or sore throat Respiratory: Denies cough, dyspnea or wheezes Cardiovascular: Denies palpitation, chest discomfort or lower extremity swelling Gastrointestinal:  Denies nausea, heartburn or change in bowel habits Skin: Denies abnormal skin rashes Lymphatics: Denies new lymphadenopathy or easy bruising Neurological: Bilateral lower extremity weakness right greater than left Behavioral/Psych: Mood is stable, no new changes  All other systems were reviewed with the patient and are negative.  I have reviewed the past medical history, past surgical history, social history and family history with the patient  and they are unchanged from previous note.  ALLERGIES:  is allergic to codeine and codeine phosphate.  MEDICATIONS:  Current Outpatient Prescriptions  Medication Sig Dispense Refill  . aspirin 81 MG tablet Take 81 mg by mouth daily.    . cholecalciferol (VITAMIN D) 1000 UNITS tablet Take 1,000 Units by mouth daily.    . diclofenac (VOLTAREN) 75 MG EC tablet TAKE 1 TABLET BY MOUTH TWICE DAILY 180 tablet 3  . letrozole (FEMARA) 2.5 MG tablet Take 1 tablet (2.5 mg total) by mouth daily. 90 tablet 3  . UNABLE TO FIND Rx: L8000- Post Surgical Bras (Quantity: 6) N2355- Silicone Breast Prosthesis (Quantity: 1) Dx: 174.9; Right Mastectomy 1 each 0   No current facility-administered medications for this visit.    PHYSICAL EXAMINATION: ECOG PERFORMANCE STATUS: 2 - Symptomatic, <50% confined to bed  Filed Vitals:   07/16/14 1623  BP: 176/80  Pulse: 98  Temp: 98 F (36.7 C)  Resp: 18   Filed Weights   07/16/14 1623  Weight: 204 lb 12.8 oz (92.897 kg)    GENERAL:alert, no distress and comfortable SKIN: skin color, texture, turgor are normal, no rashes or significant lesions EYES: normal, Conjunctiva are pink and non-injected, sclera clear OROPHARYNX:no exudate, no erythema and lips, buccal mucosa, and tongue normal  NECK: supple, thyroid normal size, non-tender, without nodularity LYMPH:  no palpable lymphadenopathy in the cervical, axillary or inguinal LUNGS: clear to auscultation and percussion with normal breathing effort HEART: regular rate & rhythm and no murmurs and no lower extremity edema ABDOMEN:abdomen soft, non-tender and normal bowel sounds Musculoskeletal:no cyanosis of digits and no clubbing  NEURO: alert & oriented x 3 with fluent  speech, no focal motor/sensory deficits  LABORATORY DATA:  I have reviewed the data as listed   Chemistry      Component Value Date/Time   NA 144 10/28/2013 1016   NA 141 08/12/2013 0923   K 4.4 10/28/2013 1016   K 4.2 08/12/2013 0923    CL 106 08/12/2013 0923   CL 107 04/05/2012 1253   CO2 27 10/28/2013 1016   CO2 27 08/12/2013 0923   BUN 18.7 10/28/2013 1016   BUN 20 08/12/2013 0923   CREATININE 0.7 10/28/2013 1016   CREATININE 0.6 08/12/2013 0923      Component Value Date/Time   CALCIUM 10.0 10/28/2013 1016   CALCIUM 9.8 08/12/2013 0923   ALKPHOS 119 10/28/2013 1016   ALKPHOS 106 08/12/2013 0923   AST 32 10/28/2013 1016   AST 42* 08/12/2013 0923   ALT 45 10/28/2013 1016   ALT 52* 08/12/2013 0923   BILITOT 0.55 10/28/2013 1016   BILITOT 0.7 08/12/2013 0923       Lab Results  Component Value Date   WBC 8.4 05/01/2014   HGB 15.5 05/01/2014   HCT 46.1 05/01/2014   MCV 96.4 05/01/2014   PLT 144* 05/01/2014   NEUTROABS 4.9 05/01/2014     RADIOGRAPHIC STUDIES: I have personally reviewed the radiology reports and agreed with their findings. MRI of the lower spine revealed 8 cm paraspinal retroperitoneal mass  ASSESSMENT & PLAN:  Paraspinal mass Large retroperitoneal mass measuring 8 x 4 x 6.5 cm, I reviewed the MRI of the spine with the patient and described the findings. I discussed the case with Dr. Pascal Lux with interventional radiology. He recommended that I get a CT chest abdomen pelvis with contrast. If there is no other area of metastatic disease, we will proceed with IR CT-guided biopsy of this mass. I instructed the patient to stop aspirin therapy. I discussed with her the differential diagnosis is between lymphoma versus sarcoma versus metastatic disease.  Left ischeal tuberosity pain: Strangely the pain that she is experiencing is way lower than where the tumor is. She has long-standing issues with sciatica and had previously received injections with good relief in that area. We will evaluate with CT scan of the presenting further that is of concern in her buttock area.  I would like to obtain CBC CMP and LDH.  Further treatment decisions based on pathology results.   Orders Placed This  Encounter  Procedures  . CBC with Differential    Standing Status: Future     Number of Occurrences:      Standing Expiration Date: 07/16/2015  . Comprehensive metabolic panel (Cmet) - [facility]    Standing Status: Future     Number of Occurrences:      Standing Expiration Date: 07/16/2015  . Lactate dehydrogenase (LDH) - [facility]    Standing Status: Future     Number of Occurrences:      Standing Expiration Date: 07/16/2015   The patient has a good understanding of the overall plan. she agrees with it. she will call with any problems that may develop before the next visit here.   Rulon Eisenmenger, MD

## 2014-07-16 NOTE — Assessment & Plan Note (Signed)
Large retroperitoneal mass measuring 8 x 4 x 6.5 cm, I reviewed the MRI of the spine with the patient and described the findings. I discussed the case with Dr. Pascal Lux with interventional radiology. He recommended that I get a CT chest abdomen pelvis with contrast. If there is no other area of metastatic disease, we will proceed with IR CT-guided biopsy of this mass. I instructed the patient to stop aspirin therapy. I discussed with her the differential diagnosis is between lymphoma versus sarcoma versus metastatic disease.  Left ischeal tuberosity pain: Strangely the pain that she is experiencing is way lower than where the tumor is. She has long-standing issues with sciatica and had previously received injections with good relief in that area. We will evaluate with CT scan of the presenting further that is of concern in her buttock area.  I would like to obtain CBC CMP and LDH.  Further treatment decisions based on pathology results.

## 2014-07-17 ENCOUNTER — Other Ambulatory Visit: Payer: Self-pay | Admitting: Hematology and Oncology

## 2014-07-17 ENCOUNTER — Other Ambulatory Visit: Payer: Self-pay | Admitting: *Deleted

## 2014-07-17 ENCOUNTER — Ambulatory Visit (HOSPITAL_COMMUNITY)
Admission: RE | Admit: 2014-07-17 | Discharge: 2014-07-17 | Disposition: A | Discharge status: Home / Self Care | Payer: Medicare Other | Source: Ambulatory Visit | Attending: Hematology and Oncology | Admitting: Hematology and Oncology

## 2014-07-17 ENCOUNTER — Other Ambulatory Visit (HOSPITAL_BASED_OUTPATIENT_CLINIC_OR_DEPARTMENT_OTHER): Payer: Medicare Other

## 2014-07-17 ENCOUNTER — Telehealth: Payer: Self-pay | Admitting: Hematology and Oncology

## 2014-07-17 DIAGNOSIS — K760 Fatty (change of) liver, not elsewhere classified: Secondary | ICD-10-CM | POA: Diagnosis not present

## 2014-07-17 DIAGNOSIS — N8110 Cystocele, unspecified: Secondary | ICD-10-CM | POA: Insufficient documentation

## 2014-07-17 DIAGNOSIS — D0511 Intraductal carcinoma in situ of right breast: Secondary | ICD-10-CM | POA: Diagnosis not present

## 2014-07-17 DIAGNOSIS — R222 Localized swelling, mass and lump, trunk: Secondary | ICD-10-CM

## 2014-07-17 DIAGNOSIS — M549 Dorsalgia, unspecified: Secondary | ICD-10-CM | POA: Diagnosis present

## 2014-07-17 DIAGNOSIS — Z853 Personal history of malignant neoplasm of breast: Secondary | ICD-10-CM | POA: Insufficient documentation

## 2014-07-17 DIAGNOSIS — J984 Other disorders of lung: Secondary | ICD-10-CM | POA: Diagnosis not present

## 2014-07-17 DIAGNOSIS — K449 Diaphragmatic hernia without obstruction or gangrene: Secondary | ICD-10-CM | POA: Insufficient documentation

## 2014-07-17 DIAGNOSIS — E042 Nontoxic multinodular goiter: Secondary | ICD-10-CM | POA: Diagnosis not present

## 2014-07-17 DIAGNOSIS — C50911 Malignant neoplasm of unspecified site of right female breast: Secondary | ICD-10-CM

## 2014-07-17 DIAGNOSIS — I700 Atherosclerosis of aorta: Secondary | ICD-10-CM | POA: Insufficient documentation

## 2014-07-17 DIAGNOSIS — N8189 Other female genital prolapse: Secondary | ICD-10-CM | POA: Diagnosis not present

## 2014-07-17 DIAGNOSIS — I2510 Atherosclerotic heart disease of native coronary artery without angina pectoris: Secondary | ICD-10-CM | POA: Insufficient documentation

## 2014-07-17 DIAGNOSIS — Z9011 Acquired absence of right breast and nipple: Secondary | ICD-10-CM | POA: Diagnosis not present

## 2014-07-17 DIAGNOSIS — M5440 Lumbago with sciatica, unspecified side: Secondary | ICD-10-CM

## 2014-07-17 LAB — CBC WITH DIFFERENTIAL/PLATELET
BASO%: 0.5 % (ref 0.0–2.0)
Basophils Absolute: 0.1 10*3/uL (ref 0.0–0.1)
EOS%: 2.5 % (ref 0.0–7.0)
Eosinophils Absolute: 0.3 10*3/uL (ref 0.0–0.5)
HCT: 44.9 % (ref 34.8–46.6)
HGB: 15.4 g/dL (ref 11.6–15.9)
LYMPH#: 2.7 10*3/uL (ref 0.9–3.3)
LYMPH%: 25.3 % (ref 14.0–49.7)
MCH: 32.8 pg (ref 25.1–34.0)
MCHC: 34.3 g/dL (ref 31.5–36.0)
MCV: 95.7 fL (ref 79.5–101.0)
MONO#: 0.8 10*3/uL (ref 0.1–0.9)
MONO%: 7.5 % (ref 0.0–14.0)
NEUT#: 6.8 10*3/uL — ABNORMAL HIGH (ref 1.5–6.5)
NEUT%: 64.2 % (ref 38.4–76.8)
Platelets: 157 10*3/uL (ref 145–400)
RBC: 4.69 10*6/uL (ref 3.70–5.45)
RDW: 12.6 % (ref 11.2–14.5)
WBC: 10.6 10*3/uL — ABNORMAL HIGH (ref 3.9–10.3)

## 2014-07-17 LAB — COMPREHENSIVE METABOLIC PANEL (CC13)
ALK PHOS: 108 U/L (ref 40–150)
ALT: 47 U/L (ref 0–55)
AST: 33 U/L (ref 5–34)
Albumin: 4 g/dL (ref 3.5–5.0)
Anion Gap: 7 mEq/L (ref 3–11)
BILIRUBIN TOTAL: 0.78 mg/dL (ref 0.20–1.20)
BUN: 22.8 mg/dL (ref 7.0–26.0)
CHLORIDE: 106 meq/L (ref 98–109)
CO2: 30 mEq/L — ABNORMAL HIGH (ref 22–29)
CREATININE: 0.7 mg/dL (ref 0.6–1.1)
Calcium: 9.8 mg/dL (ref 8.4–10.4)
EGFR: 85 mL/min/{1.73_m2} — ABNORMAL LOW (ref >90)
GLUCOSE: 91 mg/dL (ref 70–140)
Potassium: 4.5 mEq/L (ref 3.5–5.1)
Sodium: 143 mEq/L (ref 136–145)
Total Protein: 6.8 g/dL (ref 6.4–8.3)

## 2014-07-17 LAB — LACTATE DEHYDROGENASE (CC13): LDH: 218 U/L (ref 125–245)

## 2014-07-17 MED ORDER — IOHEXOL 300 MG/ML  SOLN
50.0000 mL | Freq: Once | INTRAMUSCULAR | Status: AC | PRN
  Administered 2014-07-17: 50 mL via ORAL

## 2014-07-17 MED ORDER — IOHEXOL 300 MG/ML  SOLN
100.0000 mL | Freq: Once | INTRAMUSCULAR | Status: AC | PRN
Start: 2014-07-17 — End: 2014-07-17
  Administered 2014-07-17: 100 mL via INTRAVENOUS

## 2014-07-17 NOTE — Telephone Encounter (Signed)
s.w. pt and advised on todays lab.Selena KitchenMarland KitchenMarland KitchenMarland Kitchenpt ok and aware

## 2014-07-18 ENCOUNTER — Telehealth: Payer: Self-pay | Admitting: *Deleted

## 2014-07-18 NOTE — Telephone Encounter (Signed)
TC from patient regarding her CT scan results from 07/17/14. Pt requesting call back from Dr. Lindi Adie.

## 2014-07-21 ENCOUNTER — Other Ambulatory Visit: Payer: Self-pay

## 2014-07-21 DIAGNOSIS — R222 Localized swelling, mass and lump, trunk: Secondary | ICD-10-CM

## 2014-07-22 ENCOUNTER — Other Ambulatory Visit: Payer: Self-pay | Admitting: Radiology

## 2014-07-23 ENCOUNTER — Other Ambulatory Visit: Payer: Self-pay | Admitting: Radiology

## 2014-07-24 ENCOUNTER — Ambulatory Visit (HOSPITAL_COMMUNITY): Payer: Medicare Other

## 2014-07-24 ENCOUNTER — Encounter (HOSPITAL_COMMUNITY): Payer: Self-pay

## 2014-07-24 ENCOUNTER — Ambulatory Visit (HOSPITAL_COMMUNITY)
Admission: RE | Admit: 2014-07-24 | Discharge: 2014-07-24 | Disposition: A | Discharge status: Home / Self Care | Payer: Medicare Other | Source: Ambulatory Visit | Attending: Hematology and Oncology | Admitting: Hematology and Oncology

## 2014-07-24 DIAGNOSIS — C8200 Follicular lymphoma grade I, unspecified site: Secondary | ICD-10-CM | POA: Insufficient documentation

## 2014-07-24 DIAGNOSIS — C8209 Follicular lymphoma grade I, extranodal and solid organ sites: Secondary | ICD-10-CM | POA: Diagnosis not present

## 2014-07-24 DIAGNOSIS — Z800 Family history of malignant neoplasm of digestive organs: Secondary | ICD-10-CM | POA: Insufficient documentation

## 2014-07-24 DIAGNOSIS — Z885 Allergy status to narcotic agent status: Secondary | ICD-10-CM | POA: Insufficient documentation

## 2014-07-24 DIAGNOSIS — R1900 Intra-abdominal and pelvic swelling, mass and lump, unspecified site: Secondary | ICD-10-CM | POA: Diagnosis not present

## 2014-07-24 DIAGNOSIS — Z79811 Long term (current) use of aromatase inhibitors: Secondary | ICD-10-CM | POA: Insufficient documentation

## 2014-07-24 DIAGNOSIS — Z85828 Personal history of other malignant neoplasm of skin: Secondary | ICD-10-CM | POA: Insufficient documentation

## 2014-07-24 DIAGNOSIS — R1909 Other intra-abdominal and pelvic swelling, mass and lump: Secondary | ICD-10-CM | POA: Diagnosis not present

## 2014-07-24 DIAGNOSIS — C50911 Malignant neoplasm of unspecified site of right female breast: Secondary | ICD-10-CM | POA: Diagnosis not present

## 2014-07-24 DIAGNOSIS — R222 Localized swelling, mass and lump, trunk: Secondary | ICD-10-CM

## 2014-07-24 LAB — CBC
HCT: 43.8 % (ref 36.0–46.0)
Hemoglobin: 15.1 g/dL — ABNORMAL HIGH (ref 12.0–15.0)
MCH: 32.3 pg (ref 26.0–34.0)
MCHC: 34.5 g/dL (ref 30.0–36.0)
MCV: 93.6 fL (ref 78.0–100.0)
PLATELETS: 164 10*3/uL (ref 150–400)
RBC: 4.68 MIL/uL (ref 3.87–5.11)
RDW: 12.7 % (ref 11.5–15.5)
WBC: 9.2 10*3/uL (ref 4.0–10.5)

## 2014-07-24 LAB — APTT: APTT: 26 s (ref 24–37)

## 2014-07-24 LAB — PROTIME-INR
INR: 0.98 (ref 0.00–1.49)
Prothrombin Time: 13.2 seconds (ref 11.6–15.2)

## 2014-07-24 MED ORDER — FENTANYL CITRATE (PF) 100 MCG/2ML IJ SOLN
INTRAMUSCULAR | Status: AC
  Filled 2014-07-24: qty 2

## 2014-07-24 MED ORDER — LIDOCAINE-EPINEPHRINE 1 %-1:100000 IJ SOLN
INTRAMUSCULAR | Status: AC
  Filled 2014-07-24: qty 1

## 2014-07-24 MED ORDER — SODIUM CHLORIDE 0.9 % IV SOLN: Freq: Once | INTRAVENOUS | Status: DC

## 2014-07-24 MED ORDER — MIDAZOLAM HCL 2 MG/2ML IJ SOLN
INTRAMUSCULAR | Status: AC
  Filled 2014-07-24: qty 2

## 2014-07-24 MED ORDER — FENTANYL CITRATE (PF) 100 MCG/2ML IJ SOLN
INTRAMUSCULAR | Status: AC | PRN
  Administered 2014-07-24: 50 ug via INTRAVENOUS

## 2014-07-24 MED ORDER — SODIUM CHLORIDE 0.9 % IV SOLN
8.0000 mg | Freq: Four times a day (QID) | INTRAVENOUS | Status: DC
  Administered 2014-07-24: 8 mg via INTRAVENOUS
  Filled 2014-07-24: qty 4

## 2014-07-24 MED ORDER — MIDAZOLAM HCL 2 MG/2ML IJ SOLN
INTRAMUSCULAR | Status: AC | PRN
  Administered 2014-07-24: 1 mg via INTRAVENOUS

## 2014-07-24 NOTE — Procedures (Signed)
Technically successful CT guided biopsy of indeterminate left sided RP mass  No immediate post procedural complications.

## 2014-07-24 NOTE — H&P (Signed)
Chief Complaint: Retroperitoneal Mass/lymphadenopathy  Referring Physician(s): Gudena,Vinay  History of Present Illness: Selena [name] is a 79 y.o. female with history of right breast cancer who was suffering from sciatica. She was getting injections, however these failed to help her symptoms.  This led to an MRI which revealed an 8 x 4 cm mass in the retroperitoneum paraspinal area.  We are asked to evaluate her for CT guided biopsy of this mass.  Past Medical History  Diagnosis Date  . BACK PAIN, UPPER 07/09/2008  . DIVERTICULOSIS, COLON 12/26/2006  . LIVER FUNCTION TESTS, ABNORMAL, HX OF 12/26/2007  . Memory loss 05/16/2007  . VERTIGO 09/07/2009  . Cataract   . Goiter     multi-nodular  . Hx of colonoscopy 2009  . Wears glasses   . Wears hearing aid     bilateral  . Wears dentures   . Complication of anesthesia   . PONV (postoperative nausea and vomiting)   . Atrial fibrillation 01/01/2007  . Vertigo     hx of;had Phenergan prn   . OSTEOARTHRITIS 12/26/2006    takes Diclofenac daily but has stopped for surgery  . Bruises easily   . Diverticulitis   . Skin cancer   . Breast cancer, stage 1     Past Surgical History  Procedure Laterality Date  . Abdominal hysterectomy    . Rotator cuff repair  1999    right  . Knee surgery  2004/2010    arthroscopic/ bilateral knee replacements  . Cataract extraction w/ intraocular lens  implant, bilateral  2010    bilateral  . Colonoscopy    . Appendectomy    . Ablation of dysrhythmic focus  2009  . Tmi  1998  . Colonoscopy    . Breast biopsy  2013  . Breast reconstruction  04/14/2011    Procedure: BREAST RECONSTRUCTION;  Surgeon: Crissie Reese, MD;  Location: [facility];  Service: Plastics;  Laterality: Right;  Placement of Right Breast Tissue Expander with  use of Flex HD for Breast Reconstruction  . Breast surgery      right sm, snbx    Allergies: Codeine and Codeine phosphate  Medications: Prior to Admission  medications   Medication Sig Start Date End Date Taking? Authorizing Provider  cholecalciferol (VITAMIN D) 1000 UNITS tablet Take 1,000 Units by mouth daily.   Yes Historical Provider, MD  diclofenac (VOLTAREN) 75 MG EC tablet TAKE 1 TABLET BY MOUTH TWICE DAILY 06/16/14  Yes Marletta Lor, MD  letrozole [facility]) 2.5 MG tablet Take 1 tablet (2.5 mg total) by mouth daily. 03/03/14  Yes Nicholas Lose, MD  UNABLE TO FIND Rx: [id number] Post Surgical Bras (Quantity: 6) V5643- Silicone Breast Prosthesis (Quantity: 1) Dx: 174.9; Right Mastectomy 12/07/12  Yes Neldon Mc, MD     Family History  Problem Relation Age of Onset  . Colon cancer Mother   . Cancer Mother     colon  . Colon cancer Maternal Aunt   . Cancer Maternal Uncle   . Prostate cancer Maternal Uncle   . Prostate cancer Maternal Uncle   . Other Maternal Uncle   . Anesthesia problems Neg Hx   . Hypotension Neg Hx   . Malignant hyperthermia Neg Hx   . Pseudochol deficiency Neg Hx   . Cancer Sister     kidney  . Cancer Brother     lymph node    History   Social History  . Marital Status: Widowed  Spouse Name: N/A  . Number of Children: N/A  . Years of Education: N/A   Social History Main Topics  . Smoking status: Never Smoker   . Smokeless tobacco: Never Used  . Alcohol Use: No  . Drug Use: No  . Sexual Activity: Yes    Birth Control/ Protection: Surgical   Other Topics Concern  . None   Social History Narrative    Review of Systems  Constitutional: Negative for fever, chills, activity change and appetite change.  Respiratory: Negative for cough, chest tightness and shortness of breath.   Cardiovascular: Negative for chest pain.  Gastrointestinal: Negative for nausea, vomiting and abdominal pain.  Musculoskeletal: Positive for back pain.  Skin: Negative.   Neurological: Negative.   Psychiatric/Behavioral: Negative.     Vital Signs: BP 183/78 mmHg  Pulse 73  Temp(Src) 97.7 F (36.5 C) (Oral)   Resp 20  Ht 5\' 7"  (1.702 m)  Wt 203 lb (92.08 kg)  BMI 31.79 kg/m2  SpO2 98%  Physical Exam  Constitutional: She is oriented to person, place, and time. She appears well-developed and well-nourished.  HENT:  Head: Normocephalic and atraumatic.  Eyes: EOM are normal.  Neck: Normal range of motion. Neck supple.  Cardiovascular: Normal rate, regular rhythm and normal heart sounds.   No murmur heard. Pulmonary/Chest: Effort normal and breath sounds normal.  Abdominal: Soft. Bowel sounds are normal. There is no tenderness.  Musculoskeletal: Normal range of motion.  Neurological: She is alert and oriented to person, place, and time.  Skin: Skin is warm and dry.  Psychiatric: She has a normal mood and affect. Her behavior is normal. Judgment and thought content normal.  Vitals reviewed.   Mallampati Score:  MD Evaluation Airway: WNL Heart: WNL Abdomen: WNL Chest/ Lungs: WNL ASA  Classification: 3 Mallampati/Airway Score: One  Imaging: Ct Chest W Contrast  07/17/2014   CLINICAL DATA:  Right-sided breast cancer in 2013. Right mastectomy. Back pain. Paraspinal mass seen on MRI. Evaluate for bowel obstruction.  EXAM: CT CHEST, ABDOMEN, AND PELVIS WITH CONTRAST  TECHNIQUE: Multidetector CT imaging of the chest, abdomen and pelvis was performed following the standard protocol during bolus administration of intravenous contrast.  CONTRAST:  188mL OMNIPAQUE IOHEXOL 300 MG/ML  SOLN  COMPARISON:  Lumbar spine MRI of 07/14/2014. chest CT 03/24/2006. Chest radiograph 10/10/2011.  FINDINGS: CT CHEST FINDINGS  Mediastinum/Nodes: No supraclavicular adenopathy. Left greater than right thyroid enlargement, grossly similar to the 2008 study. No axillary adenopathy. Right breast implant. Aortic atherosclerosis. Mild cardiomegaly. LAD coronary artery atherosclerosis. No central pulmonary embolism, on this non-dedicated study. No mediastinal or hilar adenopathy. Small hiatal hernia.  Lungs/Pleura: No pleural  fluid. Tracheal deviation the right, secondary to left-sided thyroid enlargement. Biapical pleural parenchymal scarring. No pulmonary nodule area of consolidation.  Musculoskeletal: No acute osseous abnormality.  CT ABDOMEN PELVIS FINDINGS  Hepatobiliary: Moderate hepatic steatosis. Right hepatic lobe 1.0 cm lesion on image 60 was present in 2008 and is likely a cyst. Normal gallbladder, without biliary ductal dilatation.  Pancreas: Normal, without mass or ductal dilatation.  Spleen: Normal  Adrenals/Urinary Tract: Normal adrenal glands. Normal left kidney. Subcentimeter upper pole right renal lesion is likely a cyst. No hydronephrosis. Pelvic floor laxity with a cystocele.  Stomach/Bowel: Normal remainder of the stomach. Extensive colonic diverticulosis. Normal small bowel.  Vascular/Lymphatic: Aortic and branch vessel atherosclerosis. Left periaortic mass corresponds to the MRI abnormality and measures 7.5 x 4.2 cm. Displaces the left renal vein anteriorly, but does not invade. Otherwise, no  abdominal pelvic adenopathy.  Reproductive: Hysterectomy.  No adnexal mass.  Other: No significant free fluid.  Moderate pelvic floor laxity.  Musculoskeletal: Degenerative disc disease, including at L3-4 and L4-5. No focal osseous lesion.  IMPRESSION: 1. Left periaortic retroperitoneal mass, corresponding to the MRI abnormality. Favored to represent isolated adenopathy, suspicious for lymphoma. An atypical appearance of metastatic disease from breast cancer could look similar. consider tissue sampling or PET to direct sampling (i.e. identify other sites of disease). 2. No evidence of bowel obstruction or other acute complication. 3. Hepatic steatosis. 4. Small hiatal hernia. 5. Chronic left greater than right thyroid enlargement, likely related to multi nodular goiter. 6.  Atherosclerosis, including within the coronary arteries. 7. Significant pelvic floor laxity with a cystocele.   Electronically Signed   By: Abigail Miyamoto  M.D.   On: 07/17/2014 16:47   Mr Lumbar Spine Wo Contrast  07/14/2014   CLINICAL DATA:  Low back pain radiating to the RIGHT leg. Symptoms for 1 month. History of breast cancer.  EXAM: MRI LUMBAR SPINE WITHOUT CONTRAST  TECHNIQUE: Multiplanar, multisequence MR imaging of the lumbar spine was performed. No intravenous contrast was administered.  COMPARISON:  CT of the chest 03/24/2006.  FINDINGS: Segmentation: Normal.  Alignment:  3 mm facet mediated anterolisthesis L3-4.  Vertebrae: Slight marrow heterogeneity felt age-related. Endplate reactive changes above and below the L3-4, and L4-5 related to disc disease.  Conus medullaris: Normal in size, signal, and location.  Paraspinal tissues: No evidence for hydronephrosis or paravertebral mass. 1 Cm sized T2 hyperintense RIGHT hepatic lesion was demonstrated on prior CT and stable. Similarly, subcentimeter RIGHT upper pole renal lesion was demonstrated previously and stable.  In the LEFT retroperitoneum, opposite L2, there is a intermediate signal intensity solid lesion measuring 8 x 4 x 6.5 cm superficial to the psoas muscle. Renal vasculature is splayed ventrally. No other similar lesions. Concern raised for possible lymphoma or retroperitoneal sarcoma. Neurogenic tumor not excluded but no definite connection with the neural foramina or lumbar plexus. Recommend CT of the chest, abdomen and pelvis with contrast for further evaluation. As best can be determined, from examination of CT chest which did not include the entire abdomen, this lesion was not present in 2008.  Disc levels:  L1-L2: Shallow central protrusion. New mild facet arthropathy. No impingement.  L2-L3:  Mild bulge.  Moderate facet arthropathy.  No impingement.  L3-L4: Disc space narrowing. 3 mm facet mediated slip. Moderate to severe central canal stenosis. Advanced facet arthropathy and ligamentum flavum hypertrophy. RIGHT greater than LEFT L4 nerve root impingement is likely.  L4-L5: Disc space  narrowing. Central protrusion. Moderate stenosis. Moderate facet and ligamentum flavum hypertrophy. LEFT greater than RIGHT subarticular zone narrowing. LEFT L5 nerve root impingement is likely.  L5-S1:  Mild bulge.  Moderate facet arthropathy.  IMPRESSION: Moderate to severe central canal stenosis at L3-4 and L4-5, multifactorial. See discussion above. With regard to RIGHT-sided symptoms, the L3-4 level is most likely to be symptomatic.  Large retroperitoneal mass measuring 8 x 4 x 6.5 cm, LEFT of the aorta at the L2 level. Concern raised for an aggressive or malignant process. CT of the chest, abdomen, and pelvis with contrast recommended for further evaluation.  These results will be called to the ordering clinician or representative by the Radiologist Assistant, and communication documented in the PACS or zVision Dashboard.   Electronically Signed   By: Staci Righter M.D.   On: 07/14/2014 15:03   Ct Abdomen Pelvis W Contrast  07/17/2014   CLINICAL DATA:  Right-sided breast cancer in 2013. Right mastectomy. Back pain. Paraspinal mass seen on MRI. Evaluate for bowel obstruction.  EXAM: CT CHEST, ABDOMEN, AND PELVIS WITH CONTRAST  TECHNIQUE: Multidetector CT imaging of the chest, abdomen and pelvis was performed following the standard protocol during bolus administration of intravenous contrast.  CONTRAST:  183mL OMNIPAQUE IOHEXOL 300 MG/ML  SOLN  COMPARISON:  Lumbar spine MRI of 07/14/2014. chest CT 03/24/2006. Chest radiograph 10/10/2011.  FINDINGS: CT CHEST FINDINGS  Mediastinum/Nodes: No supraclavicular adenopathy. Left greater than right thyroid enlargement, grossly similar to the 2008 study. No axillary adenopathy. Right breast implant. Aortic atherosclerosis. Mild cardiomegaly. LAD coronary artery atherosclerosis. No central pulmonary embolism, on this non-dedicated study. No mediastinal or hilar adenopathy. Small hiatal hernia.  Lungs/Pleura: No pleural fluid. Tracheal deviation the right, secondary to  left-sided thyroid enlargement. Biapical pleural parenchymal scarring. No pulmonary nodule area of consolidation.  Musculoskeletal: No acute osseous abnormality.  CT ABDOMEN PELVIS FINDINGS  Hepatobiliary: Moderate hepatic steatosis. Right hepatic lobe 1.0 cm lesion on image 60 was present in 2008 and is likely a cyst. Normal gallbladder, without biliary ductal dilatation.  Pancreas: Normal, without mass or ductal dilatation.  Spleen: Normal  Adrenals/Urinary Tract: Normal adrenal glands. Normal left kidney. Subcentimeter upper pole right renal lesion is likely a cyst. No hydronephrosis. Pelvic floor laxity with a cystocele.  Stomach/Bowel: Normal remainder of the stomach. Extensive colonic diverticulosis. Normal small bowel.  Vascular/Lymphatic: Aortic and branch vessel atherosclerosis. Left periaortic mass corresponds to the MRI abnormality and measures 7.5 x 4.2 cm. Displaces the left renal vein anteriorly, but does not invade. Otherwise, no abdominal pelvic adenopathy.  Reproductive: Hysterectomy.  No adnexal mass.  Other: No significant free fluid.  Moderate pelvic floor laxity.  Musculoskeletal: Degenerative disc disease, including at L3-4 and L4-5. No focal osseous lesion.  IMPRESSION: 1. Left periaortic retroperitoneal mass, corresponding to the MRI abnormality. Favored to represent isolated adenopathy, suspicious for lymphoma. An atypical appearance of metastatic disease from breast cancer could look similar. consider tissue sampling or PET to direct sampling (i.e. identify other sites of disease). 2. No evidence of bowel obstruction or other acute complication. 3. Hepatic steatosis. 4. Small hiatal hernia. 5. Chronic left greater than right thyroid enlargement, likely related to multi nodular goiter. 6.  Atherosclerosis, including within the coronary arteries. 7. Significant pelvic floor laxity with a cystocele.   Electronically Signed   By: Abigail Miyamoto M.D.   On: 07/17/2014 16:47   Mm Digital Screening  Unilat L  06/30/2014   CLINICAL DATA:  Screening.  EXAM: DIGITAL SCREENING UNILATERAL LEFT MAMMOGRAM WITH CAD  COMPARISON:  Previous exam(s).  ACR Breast Density Category b: There are scattered areas of fibroglandular density.  FINDINGS: The patient has had a right mastectomy. There are no findings suspicious for malignancy.  Images were processed with CAD.  IMPRESSION: No mammographic evidence of malignancy. A result letter of this screening mammogram will be mailed directly to the patient.  RECOMMENDATION: Screening mammogram in one year.  ([id number])  BI-RADS CATEGORY  1: Negative.   Electronically Signed   By: Nolon Nations M.D.   On: 06/30/2014 08:00    Labs:  CBC:  Recent Labs  10/28/13 1016 05/01/14 1019 07/17/14 1317 07/24/14 0750  WBC 8.7 8.4 10.6* 9.2  HGB 15.2 15.5 15.4 15.1*  HCT 45.8 46.1 44.9 43.8  PLT 170 144* 157 164    COAGS:  Recent Labs  07/24/14 0750  INR 0.98  APTT 26  BMP:  Recent Labs  08/12/13 0923 10/28/13 1016 07/17/14 1318  NA 141 144 143  K 4.2 4.4 4.5  CL 106  --   --   CO2 27 27 30*  GLUCOSE 89 99 91  BUN 20 18.7 22.8  CALCIUM 9.8 10.0 9.8  CREATININE 0.6 0.7 0.7    LIVER FUNCTION TESTS:  Recent Labs  08/12/13 0923 10/28/13 1016 07/17/14 1318  BILITOT 0.7 0.55 0.78  AST 42* 32 33  ALT 52* 45 47  ALKPHOS 106 119 108  PROT 7.1 6.9 6.8  ALBUMIN 4.1 3.8 4.0    TUMOR MARKERS: No results for input(s): AFPTM, CEA, CA199, CHROMGRNA in the last 8760 hours.  Assessment and Plan:  Paraspinal mass/Retroperitoneal Lymphadenopathy  Will proceed with CT guided biopsy of the mass/lymph node  Risks and Benefits discussed with the patient including, but not limited to bleeding, infection, damage to adjacent structures or low yield requiring additional tests. All of the patient's questions were answered, patient is agreeable to proceed. Consent signed and in chart.  Thank you for this interesting consult.  I greatly enjoyed  meeting TAILYNN ARMETTA and look forward to participating in their care.  Signed: Murrell Redden PA-C 07/24/2014, 8:36 AM   I spent a total of  20 Minutes   in face to face in clinical consultation, greater than 50% of which was counseling/coordinating care for CT guided biopsy.

## 2014-07-24 NOTE — Discharge Instructions (Signed)
Fine Needle Aspiration °Fine needle aspiration is a procedure used to remove a piece of tissue. The tissue may be removed from a swelling or abnormal growth (tumor). It is also used to confirm a cyst. A cyst is a fluid filled sac. The procedure may be done by your caregiver or a specialist. °LET YOUR CAREGIVER KNOW ABOUT:  °· Any medications you take, especially blood thinners like aspirin. °· Any problems you may have had with similar procedures in the past. °RISKS AND COMPLICATIONS °This is a safe procedure. There is a very small risk of infection and or bleeding. Other complications can occur if the aspiration site is deep in the body. Your caregiver or specialist will explain this to you.  °BEFORE THE PROCEDURE  °· No special preparation is needed in most cases. Your caregiver will let you know if there are special requirements, such as an empty stomach. °· Be sure to ask your caregiver any questions before the procedure starts. °PROCEDURE  °· This procedure is done under local or no anesthetic. °· The skin is cleaned carefully. °· A thin needle is directed into a lump. The needle is directed in several different directions into the lump while suction is applied to the needle. When samples are removed, the needle is withdrawn. °· The contents obtained are placed on a slide. They are then fixed, stained and examined under the microscope. The slide is examined by a specialist in the examination of tissue (pathologist). °· A diagnosis can then be made. The pathologist will decide if the specimen is cancerous (malignant) or not cancerous (benign). If fluid is taken from a cyst, cells from the fluid can be examined. If no material is obtained from a fine needle aspiration, the sample may not rule out a problem. Sometimes the procedure is done again. The pathologist may need several days before a result is available. °AFTER THE PROCEDURE  °· There are usually no limits on diet or activity after a fine needle  aspiration. °· Your caregiver may give you instructions regarding the aspiration site. This will include information about keeping the site clean and dry. Follow these instructions carefully. °· Call for your test results as instructed by your caregiver. Remember, it is your responsibility to get the results of your testing. Do not think everything is fine if you have not heard from your caregiver. °· Keep a close watch on the aspiration site. Report any redness, swelling or drainage. °· You should not have much pain at the aspiration site. °· Take any medications as told by your caregiver. °SEEK MEDICAL CARE IF:  °· You have pain or drainage at the aspiration site that does not go away. °· Swelling at the aspiration site does not gradually go away. °SEEK IMMEDIATE MEDICAL CARE IF:  °· You develop a fever a day or two after the aspiration. °· You develop severe pain at the aspiration site. °· You develop a warm, tender swelling at the aspiration site. °Document Released: 01/08/2000 Document Revised: 04/04/2011 Document Reviewed: 09/21/2007 °[employer]® Patient Information ©2015 [employer]. This information is not intended to replace advice given to you by your health care provider. Make sure you discuss any questions you have with your health care provider. ° °

## 2014-07-25 ENCOUNTER — Telehealth: Payer: Self-pay

## 2014-07-25 ENCOUNTER — Other Ambulatory Visit: Payer: Self-pay

## 2014-07-25 ENCOUNTER — Telehealth: Payer: Self-pay | Admitting: *Deleted

## 2014-07-25 NOTE — Telephone Encounter (Signed)
Error

## 2014-07-25 NOTE — Telephone Encounter (Signed)
NOTIFIED PT. THAT HER APPOINTMENT WITH DR.GUDENA IS 07/31/14 AT 9:15AM

## 2014-07-31 ENCOUNTER — Encounter: Payer: Self-pay | Admitting: Hematology and Oncology

## 2014-07-31 ENCOUNTER — Telehealth: Payer: Self-pay | Admitting: Hematology and Oncology

## 2014-07-31 ENCOUNTER — Telehealth: Payer: Self-pay | Admitting: *Deleted

## 2014-07-31 ENCOUNTER — Ambulatory Visit (HOSPITAL_BASED_OUTPATIENT_CLINIC_OR_DEPARTMENT_OTHER): Payer: Medicare Other | Admitting: Hematology and Oncology

## 2014-07-31 ENCOUNTER — Ambulatory Visit (HOSPITAL_BASED_OUTPATIENT_CLINIC_OR_DEPARTMENT_OTHER): Payer: Medicare Other

## 2014-07-31 VITALS — BP 152/98 | HR 75 | Temp 98.0°F | Resp 18 | Ht 67.0 in | Wt 205.4 lb

## 2014-07-31 DIAGNOSIS — C8293 Follicular lymphoma, unspecified, intra-abdominal lymph nodes: Secondary | ICD-10-CM | POA: Diagnosis not present

## 2014-07-31 DIAGNOSIS — F419 Anxiety disorder, unspecified: Secondary | ICD-10-CM

## 2014-07-31 DIAGNOSIS — C8203 Follicular lymphoma grade I, intra-abdominal lymph nodes: Secondary | ICD-10-CM

## 2014-07-31 HISTORY — DX: Follicular lymphoma grade i, intra-abdominal lymph nodes: C82.03

## 2014-07-31 LAB — COMPREHENSIVE METABOLIC PANEL (CC13)
ALBUMIN: 4.2 g/dL (ref 3.5–5.0)
ALT: 48 U/L (ref 0–55)
ANION GAP: 10 meq/L (ref 3–11)
AST: 33 U/L (ref 5–34)
Alkaline Phosphatase: 121 U/L (ref 40–150)
BILIRUBIN TOTAL: 0.87 mg/dL (ref 0.20–1.20)
BUN: 20 mg/dL (ref 7.0–26.0)
CO2: 26 mEq/L (ref 22–29)
Calcium: 10.3 mg/dL (ref 8.4–10.4)
Chloride: 106 mEq/L (ref 98–109)
Creatinine: 0.8 mg/dL (ref 0.6–1.1)
EGFR: 72 mL/min/{1.73_m2} — ABNORMAL LOW (ref >90)
Glucose: 96 mg/dl (ref 70–140)
Potassium: 4.3 mEq/L (ref 3.5–5.1)
Sodium: 142 mEq/L (ref 136–145)
TOTAL PROTEIN: 7.6 g/dL (ref 6.4–8.3)

## 2014-07-31 LAB — CBC WITH DIFFERENTIAL/PLATELET
BASO%: 0.8 % (ref 0.0–2.0)
Basophils Absolute: 0.1 10*3/uL (ref 0.0–0.1)
EOS ABS: 0.3 10*3/uL (ref 0.0–0.5)
EOS%: 3.7 % (ref 0.0–7.0)
HCT: 47.7 % — ABNORMAL HIGH (ref 34.8–46.6)
HGB: 15.9 g/dL (ref 11.6–15.9)
LYMPH%: 17.5 % (ref 14.0–49.7)
MCH: 32 pg (ref 25.1–34.0)
MCHC: 33.4 g/dL (ref 31.5–36.0)
MCV: 96 fL (ref 79.5–101.0)
MONO#: 0.7 10*3/uL (ref 0.1–0.9)
MONO%: 7.4 % (ref 0.0–14.0)
NEUT%: 70.6 % (ref 38.4–76.8)
NEUTROS ABS: 6.7 10*3/uL — AB (ref 1.5–6.5)
Platelets: 189 10*3/uL (ref 145–400)
RBC: 4.97 10*6/uL (ref 3.70–5.45)
RDW: 13 % (ref 11.2–14.5)
WBC: 9.5 10*3/uL (ref 3.9–10.3)
lymph#: 1.7 10*3/uL (ref 0.9–3.3)

## 2014-07-31 LAB — HEPATITIS B SURFACE ANTIGEN: Hepatitis B Surface Ag: NEGATIVE

## 2014-07-31 LAB — URIC ACID (CC13): Uric Acid, Serum: 4.7 mg/dl (ref 2.6–7.4)

## 2014-07-31 LAB — HEPATITIS B CORE ANTIBODY, TOTAL: Hep B Core Total Ab: NONREACTIVE

## 2014-07-31 LAB — HEPATITIS C ANTIBODY: HCV Ab: NEGATIVE

## 2014-07-31 MED ORDER — PROCHLORPERAZINE MALEATE 10 MG PO TABS: 10.0000 mg | ORAL_TABLET | Freq: Four times a day (QID) | ORAL | Status: DC | PRN

## 2014-07-31 MED ORDER — LIDOCAINE-PRILOCAINE 2.5-2.5 % EX CREA: TOPICAL_CREAM | CUTANEOUS | Status: DC

## 2014-07-31 MED ORDER — LORAZEPAM 0.5 MG PO TABS: 0.5000 mg | ORAL_TABLET | Freq: Three times a day (TID) | ORAL | Status: DC | PRN

## 2014-07-31 MED ORDER — ONDANSETRON HCL 8 MG PO TABS: 8.0000 mg | ORAL_TABLET | Freq: Two times a day (BID) | ORAL | Status: DC

## 2014-07-31 MED ORDER — ACYCLOVIR 400 MG PO TABS: 400.0000 mg | ORAL_TABLET | Freq: Every day | ORAL | Status: DC

## 2014-07-31 MED ORDER — DEXAMETHASONE 4 MG PO TABS: 4.0000 mg | ORAL_TABLET | Freq: Every day | ORAL | Status: DC

## 2014-07-31 NOTE — Progress Notes (Signed)
Patient Care Team: Marletta Lor, MD as PCP - General  DIAGNOSIS: Breast cancer, right breast   Staging form: Breast, AJCC 7th Edition     Clinical: Tis (DCIS), NX, cM0 - Unsigned   SUMMARY OF ONCOLOGIC HISTORY:   Breast cancer, right breast   04/14/2011 Surgery Right mastectomy: 2 foci of invasive ductal carcinoma 0.2 cm, grade 1 with background extensive DCIS, 0/5 lymph nodes negative,focus 1: ER 94%, PR 100%, Ki-67 5%, HER-2 negative: Focus to ER 100%BR 6% Ki-67 29% HER-2 negative   05/02/2011 -  Anti-estrogen oral therapy letrozole 2.5 mg daily    Follicular 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   07/24/2014 Initial Diagnosis Retroperitoneal mass biopsy left side: Follicular lymphoma low-grade grade 1-2, flow cytometry positive for CD10, 20, 3, 10, 5, BCL-2, BCl-6, CD21, Ki-67 less than 10%    CHIEF COMPLIANT: follow-up after recent biopsy  INTERVAL HISTORY: Selena [name] is a 79 year old with above-mentioned history of large retroperitoneal mass that was biopsied recently and it came back as follicular lymphoma. She is here today to discuss the pathology report and to discuss treatment options. She did not have any major problems from the biopsy procedure.   REVIEW OF SYSTEMS:   Constitutional: Denies fevers, chills or abnormal weight loss Eyes: Denies blurriness of vision Ears, nose, mouth, throat, and face: Denies mucositis or sore throat Respiratory: Denies cough, dyspnea or wheezes Cardiovascular: Denies palpitation, chest discomfort or lower extremity swelling Gastrointestinal:  Denies nausea, heartburn or change in bowel habits Skin: Denies abnormal skin rashes Lymphatics: Denies new lymphadenopathy or easy bruising Neurological:Denies numbness, tingling or new weaknesses Behavioral/Psych: Mood is stable, no new changes  Low back pain All other systems were reviewed with the patient and are negative.  I have reviewed the past medical history, past surgical  history, social history and family history with the patient and they are unchanged from previous note.  ALLERGIES:  is allergic to codeine and codeine phosphate.  MEDICATIONS:  Current Outpatient Prescriptions  Medication Sig Dispense Refill  . cholecalciferol (VITAMIN D) 1000 UNITS tablet Take 1,000 Units by mouth daily.    . diclofenac (VOLTAREN) 75 MG EC tablet TAKE 1 TABLET BY MOUTH TWICE DAILY 180 tablet 3  . letrozole (FEMARA) 2.5 MG tablet Take 1 tablet (2.5 mg total) by mouth daily. 90 tablet 3  . UNABLE TO FIND Rx: L8000- Post Surgical Bras (Quantity: 6) G6269- Silicone Breast Prosthesis (Quantity: 1) Dx: 174.9; Right Mastectomy 1 each 0   No current facility-administered medications for this visit.    PHYSICAL EXAMINATION: ECOG PERFORMANCE STATUS: 1 - Symptomatic but completely ambulatory  Filed Vitals:   07/31/14 0921  BP: 152/98  Pulse: 75  Temp: 98 F (36.7 C)  Resp: 18   Filed Weights   07/31/14 0921  Weight: 205 lb 6.4 oz (93.169 kg)    GENERAL:alert, no distress and comfortable SKIN: skin color, texture, turgor are normal, no rashes or significant lesions EYES: normal, Conjunctiva are pink and non-injected, sclera clear OROPHARYNX:no exudate, no erythema and lips, buccal mucosa, and tongue normal  NECK: supple, thyroid normal size, non-tender, without nodularity LYMPH:  no palpable lymphadenopathy in the cervical, axillary or inguinal LUNGS: clear to auscultation and percussion with normal breathing effort HEART: regular rate & rhythm and no murmurs and no lower extremity edema ABDOMEN:abdomen soft, non-tender and normal bowel sounds Musculoskeletal:no cyanosis of digits and no clubbing  NEURO: alert & oriented x 3 with fluent speech, no focal motor/sensory deficits  LABORATORY DATA:  I have reviewed the data as listed   Chemistry      Component Value Date/Time   NA 143 07/17/2014 1318   NA 141 08/12/2013 0923   K 4.5 07/17/2014 1318   K 4.2  08/12/2013 0923   CL 106 08/12/2013 0923   CL 107 04/05/2012 1253   CO2 30* 07/17/2014 1318   CO2 27 08/12/2013 0923   BUN 22.8 07/17/2014 1318   BUN 20 08/12/2013 0923   CREATININE 0.7 07/17/2014 1318   CREATININE 0.6 08/12/2013 0923      Component Value Date/Time   CALCIUM 9.8 07/17/2014 1318   CALCIUM 9.8 08/12/2013 0923   ALKPHOS 108 07/17/2014 1318   ALKPHOS 106 08/12/2013 0923   AST 33 07/17/2014 1318   AST 42* 08/12/2013 0923   ALT 47 07/17/2014 1318   ALT 52* 08/12/2013 0923   BILITOT 0.78 07/17/2014 1318   BILITOT 0.7 08/12/2013 0923       Lab Results  Component Value Date   WBC 9.2 07/24/2014   HGB 15.1* 07/24/2014   HCT 43.8 07/24/2014   MCV 93.6 07/24/2014   PLT 164 07/24/2014   NEUTROABS 6.8* 07/17/2014   ASSESSMENT & PLAN:  Follicular 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 Retroperitoneal mass biopsy left side 40/97/3532: Follicular lymphoma low-grade grade 1-2, flow cytometry positive for CD10, 20, 3, 10, 5, BCL-2, BCl-6, CD21, Ki-67 less than 10%, stage IB (8X 4 cm mass)  Pathology review: I discussed the pathology report in great detail with the patient explaining that difference between different types of lymphomas. Her lymphoma is of B-cell lymphoma and this is second most common type of B-cell lymphoma. The aggressiveness of follicular lymphoma is determined by the grade. Grade 1-2 is considered to be low-grade and it is not a rapidly proliferating kind. However for the lymphoma stent to have a higher rate of relapse.  Recommendation: 1. Because of the symptoms that this lymphoma is causing this time its location in the paraspinal retroperitoneal area, I recommended systemic chemotherapy with bendamustine and Rituxan 6 cycles followed by Rituxan maintenance every 2 months for 2 years. 2. PET/CT scan after the conclusion of 6 cycles of bendamustine and Rituxan. 3. Port placement 4. Hepatitis B and C testing 5. Chemotherapy education  class After much discussion we elected not to do bone marrow biopsy.  Chemotherapy counseling: I discussed risks and benefits of chemotherapy including the risk of infection, fatigue, cytopenias, nausea and vomiting, loss of taste, mild hair loss, long-term bone marrow toxicities including long-term cytopenias, long-term risk of infections, zoster reactivation. Patient understands the side effects and consented to treatment  Severe anxiety: I did prescribe her Ativan to help with anxiety.  Return to clinic to start chemotherapy In 2 weeks.    No orders of the defined types were placed in this encounter.   The patient has a good understanding of the overall plan. she agrees with it. she will call with any problems that may develop before the next visit here.   Rulon Eisenmenger, MD

## 2014-07-31 NOTE — Telephone Encounter (Signed)
Appointments made and avs printed for patient,email to michelle to help with 7/21 chemo

## 2014-07-31 NOTE — Telephone Encounter (Signed)
Per staff message and POF I have scheduled appts. Advised scheduler of appts. JMW  

## 2014-07-31 NOTE — Assessment & Plan Note (Addendum)
Retroperitoneal mass biopsy left side 82/65/8718: Follicular lymphoma low-grade grade 1-2, flow cytometry positive for CD10, 20, 3, 10, 5, BCL-2, BCl-6, CD21, Ki-67 less than 10%, stage IB (8X 4 cm mass)  Pathology review: I discussed the pathology report in great detail with the patient explaining that difference between different types of lymphomas. Her lymphoma is of B-cell lymphoma and this is second most common type of B-cell lymphoma. The aggressiveness of follicular lymphoma is determined by the grade. Grade 1-2 is considered to be low-grade and it is not a rapidly proliferating kind.   Recommendation: 1. Because of the symptoms that this lymphoma is causing this time its location in the paraspinal retroperitoneal area, I recommended systemic chemotherapy with bendamustine and Rituxan 6 cycles followed by Rituxan maintenance every 2 months for 2 years. 2. PET/CT scan after the conclusion of 6 cycles of bendamustine and Rituxan. 3. Port placement 4. Hepatitis B and C testing 5. Bone marrow biopsy to complete staging.  6. Chemotherapy education class  Return to clinic to start chemotherapy

## 2014-08-04 ENCOUNTER — Telehealth: Payer: Self-pay | Admitting: *Deleted

## 2014-08-04 NOTE — Telephone Encounter (Signed)
PT. ASKING WHEN TO START THE ACYCLOVIR. INSTRUCTED PT. TO START TAKING THE ACYCLOVIR EVERY DAY. SHE VOICED TEACH BACK METHOD. ALSO INSTRUCTED PT. CONCERNING PARKING FOR HER 08/08/14 PET SCAN APPOINTMENT AT [facility].

## 2014-08-06 ENCOUNTER — Telehealth: Payer: Self-pay | Admitting: *Deleted

## 2014-08-06 NOTE — Telephone Encounter (Signed)
VM message received @ 9:30 am from patient asking about when to expect to hear about her portacath insertion. She has not heard from Dr. Cristal Generous office.

## 2014-08-06 NOTE — Telephone Encounter (Signed)
Let pt know I sent a msg to Dr. Cristal Generous nurse.  Pt reports she had called [facility] and talked to them.  Let pt know I would watch to ensure it was scheduled in time for chemo.  Pt voiced understanding.

## 2014-08-07 ENCOUNTER — Other Ambulatory Visit: Payer: Self-pay | Admitting: General Surgery

## 2014-08-07 ENCOUNTER — Other Ambulatory Visit: Payer: Self-pay

## 2014-08-07 ENCOUNTER — Telehealth: Payer: Self-pay | Admitting: Hematology and Oncology

## 2014-08-07 NOTE — Telephone Encounter (Signed)
per message from [place] moved from 7/18 ched class to 7/19 @ 9:30 am - per [place] pt aware.

## 2014-08-08 ENCOUNTER — Encounter (HOSPITAL_COMMUNITY): Payer: Self-pay | Admitting: *Deleted

## 2014-08-08 ENCOUNTER — Ambulatory Visit (HOSPITAL_COMMUNITY)
Admission: RE | Admit: 2014-08-08 | Discharge: 2014-08-08 | Disposition: A | Discharge status: Home / Self Care | Payer: Medicare Other | Source: Ambulatory Visit | Attending: Hematology and Oncology | Admitting: Hematology and Oncology

## 2014-08-08 ENCOUNTER — Encounter (HOSPITAL_COMMUNITY): Payer: Self-pay

## 2014-08-08 DIAGNOSIS — Z79811 Long term (current) use of aromatase inhibitors: Secondary | ICD-10-CM | POA: Diagnosis not present

## 2014-08-08 DIAGNOSIS — Z96653 Presence of artificial knee joint, bilateral: Secondary | ICD-10-CM | POA: Diagnosis not present

## 2014-08-08 DIAGNOSIS — Z853 Personal history of malignant neoplasm of breast: Secondary | ICD-10-CM | POA: Diagnosis not present

## 2014-08-08 DIAGNOSIS — C8203 Follicular lymphoma grade I, intra-abdominal lymph nodes: Secondary | ICD-10-CM

## 2014-08-08 DIAGNOSIS — I4891 Unspecified atrial fibrillation: Secondary | ICD-10-CM | POA: Diagnosis not present

## 2014-08-08 DIAGNOSIS — N8110 Cystocele, unspecified: Secondary | ICD-10-CM | POA: Diagnosis not present

## 2014-08-08 DIAGNOSIS — M1990 Unspecified osteoarthritis, unspecified site: Secondary | ICD-10-CM | POA: Diagnosis not present

## 2014-08-08 DIAGNOSIS — Z85828 Personal history of other malignant neoplasm of skin: Secondary | ICD-10-CM | POA: Diagnosis not present

## 2014-08-08 DIAGNOSIS — E042 Nontoxic multinodular goiter: Secondary | ICD-10-CM | POA: Diagnosis not present

## 2014-08-08 DIAGNOSIS — K760 Fatty (change of) liver, not elsewhere classified: Secondary | ICD-10-CM | POA: Diagnosis not present

## 2014-08-08 DIAGNOSIS — C8590 Non-Hodgkin lymphoma, unspecified, unspecified site: Secondary | ICD-10-CM | POA: Diagnosis not present

## 2014-08-08 DIAGNOSIS — Z79899 Other long term (current) drug therapy: Secondary | ICD-10-CM | POA: Diagnosis not present

## 2014-08-08 DIAGNOSIS — Z791 Long term (current) use of non-steroidal anti-inflammatories (NSAID): Secondary | ICD-10-CM | POA: Diagnosis not present

## 2014-08-08 DIAGNOSIS — I2510 Atherosclerotic heart disease of native coronary artery without angina pectoris: Secondary | ICD-10-CM | POA: Diagnosis not present

## 2014-08-08 LAB — GLUCOSE, CAPILLARY: Glucose-Capillary: 89 mg/dL (ref 65–99)

## 2014-08-08 MED ORDER — FLUDEOXYGLUCOSE F - 18 (FDG) INJECTION
9.8000 | Freq: Once | INTRAVENOUS | Status: AC | PRN
  Administered 2014-08-08: 9.8 via INTRAVENOUS

## 2014-08-08 NOTE — Progress Notes (Signed)
Pt denies SOB, chest pain, and being under the care of a cardiologist. Pt denies having a cardiac cath. Pt denies having a chest x ray within the last year. Pt made aware to stop taking Aspirin, otc vitamins and herbal medications. Do not take any NSAIDs ie: Ibuprofen, Advil, Naproxen or any medication containing Aspirin. Pt verbalized understanding of all pre-op instructions. 

## 2014-08-11 ENCOUNTER — Ambulatory Visit (HOSPITAL_COMMUNITY): Payer: Medicare Other | Admitting: Anesthesiology

## 2014-08-11 ENCOUNTER — Ambulatory Visit (HOSPITAL_COMMUNITY)
Admission: RE | Admit: 2014-08-11 | Discharge: 2014-08-11 | Disposition: A | Discharge status: Home / Self Care | Payer: Medicare Other | Source: Ambulatory Visit | Attending: General Surgery | Admitting: General Surgery

## 2014-08-11 ENCOUNTER — Encounter (HOSPITAL_COMMUNITY): Payer: Self-pay | Admitting: *Deleted

## 2014-08-11 ENCOUNTER — Ambulatory Visit (HOSPITAL_COMMUNITY): Payer: Medicare Other

## 2014-08-11 ENCOUNTER — Other Ambulatory Visit: Payer: Medicare Other

## 2014-08-11 ENCOUNTER — Encounter (HOSPITAL_COMMUNITY)
Admission: RE | Disposition: A | Discharge status: Home / Self Care | Payer: Self-pay | Source: Ambulatory Visit | Attending: General Surgery

## 2014-08-11 DIAGNOSIS — Z79899 Other long term (current) drug therapy: Secondary | ICD-10-CM | POA: Insufficient documentation

## 2014-08-11 DIAGNOSIS — C8590 Non-Hodgkin lymphoma, unspecified, unspecified site: Secondary | ICD-10-CM | POA: Diagnosis not present

## 2014-08-11 DIAGNOSIS — Z4682 Encounter for fitting and adjustment of non-vascular catheter: Secondary | ICD-10-CM | POA: Diagnosis not present

## 2014-08-11 DIAGNOSIS — Z853 Personal history of malignant neoplasm of breast: Secondary | ICD-10-CM | POA: Insufficient documentation

## 2014-08-11 DIAGNOSIS — M1990 Unspecified osteoarthritis, unspecified site: Secondary | ICD-10-CM | POA: Diagnosis not present

## 2014-08-11 DIAGNOSIS — Z95828 Presence of other vascular implants and grafts: Secondary | ICD-10-CM

## 2014-08-11 DIAGNOSIS — C8203 Follicular lymphoma grade I, intra-abdominal lymph nodes: Secondary | ICD-10-CM | POA: Insufficient documentation

## 2014-08-11 DIAGNOSIS — Z96653 Presence of artificial knee joint, bilateral: Secondary | ICD-10-CM | POA: Diagnosis not present

## 2014-08-11 DIAGNOSIS — E042 Nontoxic multinodular goiter: Secondary | ICD-10-CM | POA: Diagnosis not present

## 2014-08-11 DIAGNOSIS — I4891 Unspecified atrial fibrillation: Secondary | ICD-10-CM | POA: Insufficient documentation

## 2014-08-11 DIAGNOSIS — Z79811 Long term (current) use of aromatase inhibitors: Secondary | ICD-10-CM | POA: Diagnosis not present

## 2014-08-11 DIAGNOSIS — Z85828 Personal history of other malignant neoplasm of skin: Secondary | ICD-10-CM | POA: Insufficient documentation

## 2014-08-11 DIAGNOSIS — Z791 Long term (current) use of non-steroidal anti-inflammatories (NSAID): Secondary | ICD-10-CM | POA: Insufficient documentation

## 2014-08-11 DIAGNOSIS — M549 Dorsalgia, unspecified: Secondary | ICD-10-CM | POA: Diagnosis not present

## 2014-08-11 HISTORY — PX: PORTACATH PLACEMENT: SHX2246

## 2014-08-11 HISTORY — DX: Anemia, unspecified: D64.9

## 2014-08-11 HISTORY — DX: Follicular lymphoma, unspecified, unspecified site: C82.90

## 2014-08-11 SURGERY — INSERTION, TUNNELED CENTRAL VENOUS DEVICE, WITH PORT
Anesthesia: General | Site: Chest | Laterality: Right

## 2014-08-11 MED ORDER — BUPIVACAINE HCL (PF) 0.25 % IJ SOLN
INTRAMUSCULAR | Status: AC
  Filled 2014-08-11: qty 30

## 2014-08-11 MED ORDER — ONDANSETRON HCL 4 MG/2ML IJ SOLN
INTRAMUSCULAR | Status: DC | PRN
  Administered 2014-08-11: 4 mg via INTRAVENOUS

## 2014-08-11 MED ORDER — OXYCODONE HCL 5 MG PO TABS
5.0000 mg | ORAL_TABLET | ORAL | Status: DC | PRN
  Administered 2014-08-11: 5 mg via ORAL

## 2014-08-11 MED ORDER — HEPARIN SOD (PORK) LOCK FLUSH 100 UNIT/ML IV SOLN
INTRAVENOUS | Status: DC | PRN
  Administered 2014-08-11: 400 [IU]

## 2014-08-11 MED ORDER — CEFAZOLIN SODIUM-DEXTROSE 2-3 GM-% IV SOLR
2.0000 g | INTRAVENOUS | Status: AC
  Administered 2014-08-11: 2 g via INTRAVENOUS
  Filled 2014-08-11: qty 50

## 2014-08-11 MED ORDER — BUPIVACAINE HCL (PF) 0.25 % IJ SOLN
INTRAMUSCULAR | Status: DC | PRN
  Administered 2014-08-11: 1 mL

## 2014-08-11 MED ORDER — OXYCODONE HCL 5 MG PO TABS
ORAL_TABLET | ORAL | Status: AC
  Filled 2014-08-11: qty 1

## 2014-08-11 MED ORDER — HEPARIN SOD (PORK) LOCK FLUSH 100 UNIT/ML IV SOLN
INTRAVENOUS | Status: AC
  Filled 2014-08-11: qty 5

## 2014-08-11 MED ORDER — SUCCINYLCHOLINE CHLORIDE 20 MG/ML IJ SOLN
INTRAMUSCULAR | Status: DC | PRN
  Administered 2014-08-11: 120 mg via INTRAVENOUS

## 2014-08-11 MED ORDER — SODIUM CHLORIDE 0.9 % IJ SOLN
INTRAMUSCULAR | Status: AC
  Filled 2014-08-11: qty 10

## 2014-08-11 MED ORDER — FENTANYL CITRATE (PF) 100 MCG/2ML IJ SOLN
INTRAMUSCULAR | Status: DC | PRN
  Administered 2014-08-11 (×2): 50 ug via INTRAVENOUS

## 2014-08-11 MED ORDER — GLYCOPYRROLATE 0.2 MG/ML IJ SOLN
INTRAMUSCULAR | Status: AC
  Filled 2014-08-11: qty 1

## 2014-08-11 MED ORDER — PROPOFOL 10 MG/ML IV BOLUS
INTRAVENOUS | Status: DC | PRN
  Administered 2014-08-11: 150 mg via INTRAVENOUS

## 2014-08-11 MED ORDER — HYDROCODONE-ACETAMINOPHEN 10-325 MG PO TABS: 1.0000 | ORAL_TABLET | Freq: Four times a day (QID) | ORAL | Status: DC | PRN

## 2014-08-11 MED ORDER — HYDROMORPHONE HCL 1 MG/ML IJ SOLN: 0.5000 mg | INTRAMUSCULAR | Status: DC | PRN

## 2014-08-11 MED ORDER — PROPOFOL 10 MG/ML IV BOLUS
INTRAVENOUS | Status: AC
  Filled 2014-08-11: qty 20

## 2014-08-11 MED ORDER — FENTANYL CITRATE (PF) 250 MCG/5ML IJ SOLN
INTRAMUSCULAR | Status: AC
Start: 2014-08-11 — End: 2014-08-11
  Filled 2014-08-11: qty 5

## 2014-08-11 MED ORDER — PHENYLEPHRINE HCL 10 MG/ML IJ SOLN
INTRAMUSCULAR | Status: DC | PRN
  Administered 2014-08-11 (×3): 40 ug via INTRAVENOUS

## 2014-08-11 MED ORDER — ARTIFICIAL TEARS OP OINT
TOPICAL_OINTMENT | OPHTHALMIC | Status: DC | PRN
  Administered 2014-08-11: 1 via OPHTHALMIC

## 2014-08-11 MED ORDER — 0.9 % SODIUM CHLORIDE (POUR BTL) OPTIME
TOPICAL | Status: DC | PRN
  Administered 2014-08-11: 1000 mL

## 2014-08-11 MED ORDER — LIDOCAINE HCL 4 % MT SOLN
OROMUCOSAL | Status: DC | PRN
  Administered 2014-08-11: 4 mL via TOPICAL

## 2014-08-11 MED ORDER — ARTIFICIAL TEARS OP OINT
TOPICAL_OINTMENT | OPHTHALMIC | Status: AC
  Filled 2014-08-11: qty 3.5

## 2014-08-11 MED ORDER — LIDOCAINE HCL (CARDIAC) 20 MG/ML IV SOLN
INTRAVENOUS | Status: AC
  Filled 2014-08-11: qty 5

## 2014-08-11 MED ORDER — ONDANSETRON HCL 4 MG/2ML IJ SOLN: 4.0000 mg | Freq: Once | INTRAMUSCULAR | Status: DC | PRN

## 2014-08-11 MED ORDER — ROCURONIUM BROMIDE 50 MG/5ML IV SOLN
INTRAVENOUS | Status: AC
  Filled 2014-08-11: qty 1

## 2014-08-11 MED ORDER — NEOSTIGMINE METHYLSULFATE 10 MG/10ML IV SOLN: INTRAVENOUS | Status: DC | PRN

## 2014-08-11 MED ORDER — EPHEDRINE SULFATE 50 MG/ML IJ SOLN
INTRAMUSCULAR | Status: AC
  Filled 2014-08-11: qty 1

## 2014-08-11 MED ORDER — PHENYLEPHRINE 40 MCG/ML (10ML) SYRINGE FOR IV PUSH (FOR BLOOD PRESSURE SUPPORT)
PREFILLED_SYRINGE | INTRAVENOUS | Status: AC
  Filled 2014-08-11: qty 10

## 2014-08-11 MED ORDER — ONDANSETRON HCL 4 MG/2ML IJ SOLN
INTRAMUSCULAR | Status: AC
  Filled 2014-08-11: qty 2

## 2014-08-11 MED ORDER — LACTATED RINGERS IV SOLN
INTRAVENOUS | Status: DC | PRN
  Administered 2014-08-11 (×2): via INTRAVENOUS

## 2014-08-11 MED ORDER — SODIUM CHLORIDE 0.9 % IR SOLN
Status: DC | PRN
  Administered 2014-08-11: 500 mL

## 2014-08-11 MED ORDER — GLYCOPYRROLATE 0.2 MG/ML IJ SOLN
INTRAMUSCULAR | Status: DC | PRN
  Administered 2014-08-11: 0.2 mg via INTRAVENOUS

## 2014-08-11 MED ORDER — LIDOCAINE HCL (CARDIAC) 20 MG/ML IV SOLN
INTRAVENOUS | Status: DC | PRN
  Administered 2014-08-11: 75 mg via INTRAVENOUS

## 2014-08-11 MED ORDER — SUCCINYLCHOLINE CHLORIDE 20 MG/ML IJ SOLN
INTRAMUSCULAR | Status: AC
  Filled 2014-08-11: qty 1

## 2014-08-11 SURGICAL SUPPLY — 51 items
BAG DECANTER FOR FLEXI CONT (MISCELLANEOUS) ×3 IMPLANT
BLADE SURG 11 STRL SS (BLADE) ×3 IMPLANT
BLADE SURG 15 STRL LF DISP TIS (BLADE) ×1 IMPLANT
BLADE SURG 15 STRL SS (BLADE) ×3
CANISTER SUCTION 2500CC (MISCELLANEOUS) IMPLANT
CHLORAPREP W/TINT 26ML (MISCELLANEOUS) ×3 IMPLANT
COVER SURGICAL LIGHT HANDLE (MISCELLANEOUS) ×3 IMPLANT
COVER TRANSDUCER ULTRASND GEL (DRAPE) ×3 IMPLANT
CRADLE DONUT ADULT HEAD (MISCELLANEOUS) ×3 IMPLANT
DECANTER SPIKE VIAL GLASS SM (MISCELLANEOUS) ×3 IMPLANT
DRAPE C-ARM 42X72 X-RAY (DRAPES) ×3 IMPLANT
DRAPE LAPAROSCOPIC ABDOMINAL (DRAPES) ×3 IMPLANT
ELECT CAUTERY BLADE 6.4 (BLADE) ×3 IMPLANT
ELECT REM PT RETURN 9FT ADLT (ELECTROSURGICAL) ×3
ELECTRODE REM PT RTRN 9FT ADLT (ELECTROSURGICAL) ×1 IMPLANT
GAUZE SPONGE 4X4 16PLY XRAY LF (GAUZE/BANDAGES/DRESSINGS) ×3 IMPLANT
GEL ULTRASOUND 20GR AQUASONIC (MISCELLANEOUS) IMPLANT
GLOVE BIO SURGEON STRL SZ7 (GLOVE) ×3 IMPLANT
GLOVE BIOGEL PI IND STRL 7.5 (GLOVE) ×1 IMPLANT
GLOVE BIOGEL PI INDICATOR 7.5 (GLOVE) ×2
GOWN STRL REUS W/ TWL LRG LVL3 (GOWN DISPOSABLE) ×2 IMPLANT
GOWN STRL REUS W/TWL LRG LVL3 (GOWN DISPOSABLE) ×6
INTRODUCER COOK 11FR (CATHETERS) IMPLANT
KIT BASIN OR (CUSTOM PROCEDURE TRAY) ×3 IMPLANT
KIT PORT POWER 8FR ISP CVUE (Catheter) ×3 IMPLANT
KIT ROOM TURNOVER OR (KITS) ×3 IMPLANT
LIQUID BAND (GAUZE/BANDAGES/DRESSINGS) ×3 IMPLANT
NDL HYPO 25GX1X1/2 BEV (NEEDLE) ×1 IMPLANT
NEEDLE HYPO 25GX1X1/2 BEV (NEEDLE) ×3 IMPLANT
NS IRRIG 1000ML POUR BTL (IV SOLUTION) ×3 IMPLANT
PACK SURGICAL SETUP 50X90 (CUSTOM PROCEDURE TRAY) ×3 IMPLANT
PAD ARMBOARD 7.5X6 YLW CONV (MISCELLANEOUS) ×6 IMPLANT
PENCIL BUTTON HOLSTER BLD 10FT (ELECTRODE) ×3 IMPLANT
SET INTRODUCER 12FR PACEMAKER (SHEATH) IMPLANT
SET SHEATH INTRODUCER 10FR (MISCELLANEOUS) IMPLANT
SHEATH COOK PEEL AWAY SET 9F (SHEATH) IMPLANT
STAPLER VISISTAT 35W (STAPLE) ×3 IMPLANT
SUT MNCRL AB 4-0 PS2 18 (SUTURE) ×3 IMPLANT
SUT PROLENE 2 0 SH DA (SUTURE) ×3 IMPLANT
SUT SILK 2 0 (SUTURE)
SUT SILK 2-0 18XBRD TIE 12 (SUTURE) IMPLANT
SUT VIC AB 3-0 SH 27 (SUTURE) ×3
SUT VIC AB 3-0 SH 27XBRD (SUTURE) ×1 IMPLANT
SYR 20ML ECCENTRIC (SYRINGE) ×6 IMPLANT
SYR 5ML LUER SLIP (SYRINGE) ×3 IMPLANT
SYR CONTROL 10ML LL (SYRINGE) IMPLANT
TOWEL OR 17X24 6PK STRL BLUE (TOWEL DISPOSABLE) ×3 IMPLANT
TOWEL OR 17X26 10 PK STRL BLUE (TOWEL DISPOSABLE) ×3 IMPLANT
TUBE CONNECTING 12'X1/4 (SUCTIONS)
TUBE CONNECTING 12X1/4 (SUCTIONS) IMPLANT
YANKAUER SUCT BULB TIP NO VENT (SUCTIONS) IMPLANT

## 2014-08-11 NOTE — H&P (Signed)
79 year old with history of large retroperitoneal mass that was biopsied recently and it came back as follicular lymphoma. She has a history of breast cancer for which I have cared for her before also.    REVIEW OF SYSTEMS:  Constitutional: Denies fevers, chills or abnormal weight loss Eyes: Denies blurriness of vision Ears, nose, mouth, throat, and face: Denies mucositis or sore throat Respiratory: Denies cough, dyspnea or wheezes Cardiovascular: Denies palpitation, chest discomfort or lower extremity swelling Gastrointestinal: Denies nausea, heartburn or change in bowel habits Skin: Denies abnormal skin rashes Lymphatics: Denies new lymphadenopathy or easy bruising Neurological:Denies numbness, tingling or new weaknesses Behavioral/Psych: Mood is stable, no new changes  Low back pain All other systems were reviewed with the patient and are negative.   Expand All Collapse All     Past Medical History  Diagnosis Date  . BACK PAIN, UPPER 07/09/2008  . DIVERTICULOSIS, COLON 12/26/2006  . LIVER FUNCTION TESTS, ABNORMAL, HX OF 12/26/2007  . Memory loss 05/16/2007  . VERTIGO 09/07/2009  . Cataract   . Goiter     multi-nodular  . Hx of colonoscopy 2009  . Wears glasses   . Wears hearing aid     bilateral  . Wears dentures   . Complication of anesthesia   . PONV (postoperative nausea and vomiting)   . Atrial fibrillation 01/01/2007  . Vertigo     hx of;had Phenergan prn   . OSTEOARTHRITIS 12/26/2006    takes Diclofenac daily but has stopped for surgery  . Bruises easily   . Diverticulitis   . Skin cancer   . Breast cancer, stage 1     History   Social History  . Marital Status: Widowed    Spouse Name: N/A  . Number of Children: N/A  . Years of Education: N/A   Occupational History  . Not on file.   Social History Main Topics  . Smoking status: Never Smoker    . Smokeless tobacco: Never Used  . Alcohol Use: No  . Drug Use: No  . Sexual Activity: Yes    Birth Control/ Protection: Surgical   Other Topics Concern  . Not on file   Social History Narrative    Past Surgical History  Procedure Laterality Date  . Abdominal hysterectomy    . Rotator cuff repair  1999    right  . Knee surgery  2004/2010    arthroscopic/ bilateral knee replacements  . Cataract extraction w/ intraocular lens implant, bilateral  2010    bilateral  . Colonoscopy    . Appendectomy    . Ablation of dysrhythmic focus  2009  . Tmi  1998  . Colonoscopy    . Breast biopsy  2013  . Breast reconstruction  04/14/2011    Procedure: BREAST RECONSTRUCTION; Surgeon: Crissie Reese, MD; Location: [facility]; Service: Plastics; Laterality: Right; Placement of Right Breast Tissue Expander with use of Flex HD for Breast Reconstruction  . Breast surgery      right sm, snbx    Family History  Problem Relation Age of Onset  . Colon cancer Mother   . Cancer Mother     colon  . Colon cancer Maternal Aunt   . Cancer Maternal Uncle   . Prostate cancer Maternal Uncle   . Prostate cancer Maternal Uncle   . Other Maternal Uncle   . Anesthesia problems Neg Hx   . Hypotension Neg Hx   . Malignant hyperthermia Neg Hx   . Pseudochol deficiency Neg Hx   .  Cancer Sister     kidney  . Cancer Brother     lymph node           ALLERGIES: is allergic to codeine and codeine phosphate.  MEDICATIONS:  Current Outpatient Prescriptions  Medication Sig Dispense Refill  . cholecalciferol (VITAMIN D) 1000 UNITS tablet Take 1,000 Units by mouth daily.    . diclofenac (VOLTAREN) 75 MG EC tablet TAKE 1 TABLET BY MOUTH TWICE DAILY 180 tablet 3  . letrozole (FEMARA) 2.5 MG tablet Take 1 tablet (2.5 mg total) by  mouth daily. 90 tablet 3  . UNABLE TO FIND Rx: L8000- Post Surgical Bras (Quantity: 6) T0240- Silicone Breast Prosthesis (Quantity: 1) Dx: 174.9; Right Mastectomy 1 each 0   No current facility-administered medications for this visit.    PHYSICAL EXAMINATION: ECOG PERFORMANCE STATUS: 1 - Symptomatic but completely ambulatory  Filed Vitals:   07/31/14 0921  BP: 152/98  Pulse: 75  Temp: 98 F (36.7 C)  Resp: 18   Filed Weights   07/31/14 0921  Weight: 205 lb 6.4 oz (93.169 kg)    GENERAL:alert, no distress and comfortable LYMPH: no palpable lymphadenopathy in the cervical, axillary  LUNGS: clear to auscultation and percussion with normal breathing effort HEART: regular rate & rhythm and no murmurs and no lower extremity edema ABDOMEN:abdomen soft, non-tender and normal bowel sounds    LABORATORY DATA:  I have reviewed the data as listed  Chemistry    Labs (Brief)       Component Value Date/Time   NA 143 07/17/2014 1318   NA 141 08/12/2013 0923   K 4.5 07/17/2014 1318   K 4.2 08/12/2013 0923   CL 106 08/12/2013 0923   CL 107 04/05/2012 1253   CO2 30* 07/17/2014 1318   CO2 27 08/12/2013 0923   BUN 22.8 07/17/2014 1318   BUN 20 08/12/2013 0923   CREATININE 0.7 07/17/2014 1318   CREATININE 0.6 08/12/2013 0923      Labs (Brief)       Component Value Date/Time   CALCIUM 9.8 07/17/2014 1318   CALCIUM 9.8 08/12/2013 0923   ALKPHOS 108 07/17/2014 1318   ALKPHOS 106 08/12/2013 0923   AST 33 07/17/2014 1318   AST 42* 08/12/2013 0923   ALT 47 07/17/2014 1318   ALT 52* 08/12/2013 0923   BILITOT 0.78 07/17/2014 1318   BILITOT 0.7 08/12/2013 0923          Recent Labs    Lab Results  Component Value Date   WBC 9.2 07/24/2014   HGB 15.1* 07/24/2014   HCT 43.8 07/24/2014   MCV 93.6 07/24/2014   PLT 164  07/24/2014   NEUTROABS 6.8* 07/17/2014     ASSESSMENT & PLAN:  Follicular 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 Plan for port placement today.

## 2014-08-11 NOTE — Transfer of Care (Signed)
Immediate Anesthesia Transfer of Care Note  Patient: Selena [name]  Procedure(s) Performed: Procedure(s): INSERTION PORT-A-CATH WITH ULTRASOUND (Right)  Patient Location: PACU  Anesthesia Type:General  Level of Consciousness: responds to stimulation  Airway & Oxygen Therapy: Patient Spontanous Breathing and Patient connected to nasal cannula oxygen  Post-op Assessment: Report given to RN and Post -op Vital signs reviewed and stable  Post vital signs: Reviewed and stable  Last Vitals:  Filed Vitals:   08/11/14 0833  BP:   Pulse:   Temp: 36.5 C  Resp:     Complications: No apparent anesthesia complications

## 2014-08-11 NOTE — Anesthesia Procedure Notes (Signed)
Procedure Name: Intubation Date/Time: 08/11/2014 7:35 AM Performed by: Scheryl Darter Pre-anesthesia Checklist: Patient identified, Emergency Drugs available, Suction available, Patient being monitored and Timeout performed Patient Re-evaluated:Patient Re-evaluated prior to inductionOxygen Delivery Method: Circle system utilized Preoxygenation: Pre-oxygenation with 100% oxygen Intubation Type: IV induction Ventilation: Mask ventilation without difficulty Laryngoscope Size: Miller and 2 Grade View: Grade I Tube type: Oral Tube size: 7.5 mm Number of attempts: 1 Airway Equipment and Method: Stylet Placement Confirmation: ETT inserted through vocal cords under direct vision,  positive ETCO2 and breath sounds checked- equal and bilateral Secured at: 22 cm Tube secured with: Tape Dental Injury: Teeth and Oropharynx as per pre-operative assessment

## 2014-08-11 NOTE — Anesthesia Preprocedure Evaluation (Signed)
Anesthesia Evaluation  Patient identified by MRN, date of birth, ID band Patient awake    Reviewed: Allergy & Precautions, NPO status , Patient's Chart, lab work & pertinent test results  History of Anesthesia Complications (+) PONV  Airway Mallampati: I       Dental   Pulmonary    Pulmonary exam normal       Cardiovascular Normal cardiovascular exam+ dysrhythmias Atrial Fibrillation     Neuro/Psych    GI/Hepatic   Endo/Other    Renal/GU      Musculoskeletal  (+) Arthritis -,   Abdominal   Peds  Hematology   Anesthesia Other Findings Ca Breast  Reproductive/Obstetrics                             Anesthesia Physical Anesthesia Plan  ASA: III  Anesthesia Plan: General   Post-op Pain Management:    Induction: Intravenous  Airway Management Planned: Oral ETT  Additional Equipment:   Intra-op Plan:   Post-operative Plan: Extubation in OR  Informed Consent: I have reviewed the patients History and Physical, chart, labs and discussed the procedure including the risks, benefits and alternatives for the proposed anesthesia with the patient or authorized representative who has indicated his/her understanding and acceptance.     Plan Discussed with: CRNA, Anesthesiologist and Surgeon  Anesthesia Plan Comments:         Anesthesia Quick Evaluation

## 2014-08-11 NOTE — Discharge Instructions (Signed)
    PORT-A-CATH: POST OP INSTRUCTIONS  Always review your discharge instruction sheet given to you by the facility where your surgery was performed.   1. A prescription for pain medication may be given to you upon discharge. Take your pain medication as prescribed, if needed. If narcotic pain medicine is not needed, then you make take acetaminophen (Tylenol) or ibuprofen (Advil) as needed.  2. Take your usually prescribed medications unless otherwise directed. 3. If you need a refill on your pain medication, please contact our office. All narcotic pain medicine now requires a paper prescription.  Phoned in and fax refills are no longer allowed by law.  Prescriptions will not be filled after 5 pm or on weekends.  4. You should follow a light diet for the remainder of the day after your procedure. 5. Most patients will experience some mild swelling and/or bruising in the area of the incision. It may take several days to resolve. 6. It is common to experience some constipation if taking pain medication after surgery. Increasing fluid intake and taking a stool softener (such as Colace) will usually help or prevent this problem from occurring. A mild laxative (Milk of Magnesia or Miralax) should be taken according to package directions if there are no bowel movements after 48 hours.  7. Unless discharge instructions indicate otherwise, you may remove your bandages 48 hours after surgery, and you may shower at that time. You may have steri-strips (small white skin tapes) in place directly over the incision.  These strips should be left on the skin for 7-10 days.  If your surgeon used Dermabond (skin glue) on the incision, you may shower in 24 hours.  The glue will flake off over the next 2-3 weeks.  8. If your port is left accessed at the end of surgery (needle left in port), the dressing cannot get wet and should only by changed by a healthcare professional. When the port is no longer accessed (when the  needle has been removed), follow step 7.   9. ACTIVITIES:  Limit activity involving your arms for the next 72 hours. Do no strenuous exercise or activity for 1 week. You may drive when you are no longer taking prescription pain medication, you can comfortably wear a seatbelt, and you can maneuver your car. 10.You may need to see your doctor in the office for a follow-up appointment.  Please       check with your doctor.  11.When you receive a new Port-a-Cath, you will get a product guide and        ID card.  Please keep them in case you need them.  WHEN TO CALL YOUR DOCTOR ([phone]): 1. Fever over 101.0 2. Chills 3. Continued bleeding from incision 4. Increased redness and tenderness at the site 5. Shortness of breath, difficulty breathing   The clinic staff is available to answer your questions during regular business hours. Please don't hesitate to call and ask to speak to one of the nurses or medical assistants for clinical concerns. If you have a medical emergency, go to the nearest emergency room or call 911.  A surgeon from [facility] is always on call at the hospital.     For further information, please visit [email or web address]      

## 2014-08-11 NOTE — Anesthesia Postprocedure Evaluation (Signed)
  Anesthesia Post-op Note  Patient: Selena [name]  Procedure(s) Performed: Procedure(s): INSERTION PORT-A-CATH WITH ULTRASOUND (Right)  Patient Location: PACU  Anesthesia Type:General  Level of Consciousness: awake, oriented, sedated and patient cooperative  Airway and Oxygen Therapy: Patient Spontanous Breathing  Post-op Pain: mild  Post-op Assessment: Post-op Vital signs reviewed, Patient's Cardiovascular Status Stable, Respiratory Function Stable, Patent Airway, No signs of Nausea or vomiting and Pain level controlled              Post-op Vital Signs: stable  Last Vitals:  Filed Vitals:   08/11/14 0900  BP:   Pulse: 84  Temp:   Resp: 15    Complications: No apparent anesthesia complications

## 2014-08-11 NOTE — Op Note (Signed)
Preoperative diagnosis: lymphoma Postoperative diagnosis: same as above Procedure: right ij powerport insertion Surgeon: Dr Serita Grammes EBL: minimal Anes: general  Specimens none Complications none Drains none Sponge count correct Dispo to pacu stable  Indications: This is a 79 year old female I know from her treatment of her breast cancer. She recently has been diagnosed with an intra-abdominal follicular lymphoma. We discussed port placement prior to her beginning chemotherapy.  Procedure: After informed consent was obtained the patient was taken to the operating room. She was given cefazolin. Sequential compression devices were on her legs. She was then placed under general anesthesia with an LMA. She had a roll placed. The she was then prepped and draped in the standard sterile surgical fashion. Surgical timeout was then performed.  I used the ultrasound to identify the right internal jugular vein. I then accessed this with a needle under direct vision. This aspirated blood. I then placed the wire. The wire initially curled I was able to pull this back straight and out and place it in the correct position. I then made a pocket above her implant below her clavicle on the right side. I tunneled the line between the 2 sites. I then dilated the tract and placed the dilator assembly with the sheath. This was done under fluoroscopy. I then removed the sheath and dilator. The wire was also removed. The wire had been placed in this. The line was then pulled back to be in the distal cava. I hooked this up to the port. I sutured this into place with 2-0 Prolene in 2 places. This aspirated blood and flushed easily. I placed heparin and the port. This was confirmed with a final fluoroscopy. I then closed this with 2-0 Vicryl and 4-0 Monocryl. Dermabond was placed on both the sides. She tolerated this well and was transferred to the recovery room in stable condition.

## 2014-08-11 NOTE — Addendum Note (Signed)
Addendum  created 08/11/14 1356 by Scheryl Darter, CRNA   Modules edited: Anesthesia Attestations

## 2014-08-12 ENCOUNTER — Other Ambulatory Visit: Payer: Medicare Other

## 2014-08-12 ENCOUNTER — Encounter (HOSPITAL_COMMUNITY): Payer: Self-pay | Admitting: General Surgery

## 2014-08-12 ENCOUNTER — Encounter: Payer: Self-pay | Admitting: *Deleted

## 2014-08-13 NOTE — Assessment & Plan Note (Signed)
Retroperitoneal mass biopsy left side 08/88/3584: Follicular lymphoma low-grade grade 1-2, flow cytometry positive for CD10, 20, 3, 10, 5, BCL-2, BCl-6, CD21, Ki-67 less than 10%, stage IB (8X 4 cm mass)  Treatment plan:systemic chemotherapy with bendamustine and Rituxan 6 cycles followed by Rituxan maintenance every 2 months for 2 years  Current treatment: Cycle 1 day 1 bendamustine and Rituxan Chemotherapy monitoring: 1. Port has been placed 2. Hepatitis B and C negative 3. Completed chemotherapy education class  Severe anxiety: Previously prescribed Ativan Return to clinic in 1 week for toxicity check and follow-up.

## 2014-08-14 ENCOUNTER — Encounter: Payer: Self-pay | Admitting: Hematology and Oncology

## 2014-08-14 ENCOUNTER — Telehealth: Payer: Self-pay | Admitting: Hematology and Oncology

## 2014-08-14 ENCOUNTER — Other Ambulatory Visit (HOSPITAL_BASED_OUTPATIENT_CLINIC_OR_DEPARTMENT_OTHER): Payer: Medicare Other

## 2014-08-14 ENCOUNTER — Ambulatory Visit (HOSPITAL_BASED_OUTPATIENT_CLINIC_OR_DEPARTMENT_OTHER): Payer: Medicare Other

## 2014-08-14 ENCOUNTER — Ambulatory Visit (HOSPITAL_BASED_OUTPATIENT_CLINIC_OR_DEPARTMENT_OTHER): Payer: Medicare Other | Admitting: Hematology and Oncology

## 2014-08-14 VITALS — BP 135/66 | HR 97 | Temp 97.9°F | Resp 20

## 2014-08-14 VITALS — BP 146/66 | HR 88 | Temp 98.5°F | Resp 18 | Ht 67.0 in | Wt 208.0 lb

## 2014-08-14 DIAGNOSIS — C8203 Follicular lymphoma grade I, intra-abdominal lymph nodes: Secondary | ICD-10-CM

## 2014-08-14 DIAGNOSIS — Z5112 Encounter for antineoplastic immunotherapy: Secondary | ICD-10-CM | POA: Diagnosis not present

## 2014-08-14 DIAGNOSIS — F418 Other specified anxiety disorders: Secondary | ICD-10-CM

## 2014-08-14 DIAGNOSIS — R6889 Other general symptoms and signs: Secondary | ICD-10-CM | POA: Diagnosis not present

## 2014-08-14 DIAGNOSIS — C8230 Follicular lymphoma grade IIIa, unspecified site: Secondary | ICD-10-CM | POA: Diagnosis not present

## 2014-08-14 DIAGNOSIS — Z5111 Encounter for antineoplastic chemotherapy: Secondary | ICD-10-CM

## 2014-08-14 LAB — CBC WITH DIFFERENTIAL/PLATELET
BASO%: 0.8 % (ref 0.0–2.0)
BASOS ABS: 0.1 10*3/uL (ref 0.0–0.1)
EOS%: 4.5 % (ref 0.0–7.0)
Eosinophils Absolute: 0.3 10*3/uL (ref 0.0–0.5)
HEMATOCRIT: 41.4 % (ref 34.8–46.6)
HEMOGLOBIN: 13.9 g/dL (ref 11.6–15.9)
LYMPH%: 19.7 % (ref 14.0–49.7)
MCH: 32.1 pg (ref 25.1–34.0)
MCHC: 33.5 g/dL (ref 31.5–36.0)
MCV: 95.8 fL (ref 79.5–101.0)
MONO#: 0.7 10*3/uL (ref 0.1–0.9)
MONO%: 9.3 % (ref 0.0–14.0)
NEUT#: 5 10*3/uL (ref 1.5–6.5)
NEUT%: 65.7 % (ref 38.4–76.8)
Platelets: 151 10*3/uL (ref 145–400)
RBC: 4.32 10*6/uL (ref 3.70–5.45)
RDW: 12.7 % (ref 11.2–14.5)
WBC: 7.6 10*3/uL (ref 3.9–10.3)
lymph#: 1.5 10*3/uL (ref 0.9–3.3)

## 2014-08-14 LAB — COMPREHENSIVE METABOLIC PANEL (CC13)
ALT: 43 U/L (ref 0–55)
AST: 37 U/L — ABNORMAL HIGH (ref 5–34)
Albumin: 3.5 g/dL (ref 3.5–5.0)
Alkaline Phosphatase: 100 U/L (ref 40–150)
Anion Gap: 8 mEq/L (ref 3–11)
BUN: 15.3 mg/dL (ref 7.0–26.0)
CO2: 27 meq/L (ref 22–29)
Calcium: 9.5 mg/dL (ref 8.4–10.4)
Chloride: 109 mEq/L (ref 98–109)
Creatinine: 0.7 mg/dL (ref 0.6–1.1)
EGFR: 86 mL/min/{1.73_m2} — ABNORMAL LOW (ref >90)
GLUCOSE: 104 mg/dL (ref 70–140)
Potassium: 4.2 mEq/L (ref 3.5–5.1)
Sodium: 144 mEq/L (ref 136–145)
TOTAL PROTEIN: 6.3 g/dL — AB (ref 6.4–8.3)
Total Bilirubin: 0.71 mg/dL (ref 0.20–1.20)

## 2014-08-14 MED ORDER — SODIUM CHLORIDE 0.9 % IV SOLN
375.0000 mg/m2 | Freq: Once | INTRAVENOUS | Status: AC
  Administered 2014-08-14: 800 mg via INTRAVENOUS
  Filled 2014-08-14: qty 80

## 2014-08-14 MED ORDER — DIPHENHYDRAMINE HCL 50 MG/ML IJ SOLN
25.0000 mg | Freq: Once | INTRAMUSCULAR | Status: AC | PRN
  Administered 2014-08-14: 25 mg via INTRAVENOUS

## 2014-08-14 MED ORDER — SODIUM CHLORIDE 0.9 % IJ SOLN
10.0000 mL | INTRAMUSCULAR | Status: DC | PRN
  Administered 2014-08-14: 10 mL
  Filled 2014-08-14: qty 10

## 2014-08-14 MED ORDER — FAMOTIDINE IN NACL 20-0.9 MG/50ML-% IV SOLN
20.0000 mg | Freq: Once | INTRAVENOUS | Status: AC | PRN
  Administered 2014-08-14: 20 mg via INTRAVENOUS

## 2014-08-14 MED ORDER — MEPERIDINE HCL 25 MG/ML IJ SOLN
INTRAMUSCULAR | Status: AC
  Filled 2014-08-14: qty 1

## 2014-08-14 MED ORDER — DIPHENHYDRAMINE HCL 25 MG PO CAPS
50.0000 mg | ORAL_CAPSULE | Freq: Once | ORAL | Status: AC
  Administered 2014-08-14: 50 mg via ORAL

## 2014-08-14 MED ORDER — METHYLPREDNISOLONE SODIUM SUCC 125 MG IJ SOLR
125.0000 mg | Freq: Once | INTRAMUSCULAR | Status: AC | PRN
  Administered 2014-08-14: 125 mg via INTRAVENOUS

## 2014-08-14 MED ORDER — SODIUM CHLORIDE 0.9 % IV SOLN
75.0000 mg/m2 | Freq: Once | INTRAVENOUS | Status: AC
  Administered 2014-08-14: 150 mg via INTRAVENOUS
  Filled 2014-08-14: qty 6

## 2014-08-14 MED ORDER — HEPARIN SOD (PORK) LOCK FLUSH 100 UNIT/ML IV SOLN
500.0000 [IU] | Freq: Once | INTRAVENOUS | Status: AC | PRN
  Administered 2014-08-14: 500 [IU]
  Filled 2014-08-14: qty 5

## 2014-08-14 MED ORDER — MEPERIDINE HCL 25 MG/ML IJ SOLN
12.5000 mg | Freq: Once | INTRAMUSCULAR | Status: AC
  Administered 2014-08-14: 12.5 mg via INTRAVENOUS

## 2014-08-14 MED ORDER — ACETAMINOPHEN 325 MG PO TABS
650.0000 mg | ORAL_TABLET | Freq: Once | ORAL | Status: AC
  Administered 2014-08-14: 650 mg via ORAL

## 2014-08-14 MED ORDER — SODIUM CHLORIDE 0.9 % IV SOLN
Freq: Once | INTRAVENOUS | Status: AC
  Administered 2014-08-14: 11:00:00 via INTRAVENOUS

## 2014-08-14 MED ORDER — DIPHENHYDRAMINE HCL 25 MG PO CAPS
ORAL_CAPSULE | ORAL | Status: AC
  Filled 2014-08-14: qty 2

## 2014-08-14 MED ORDER — SODIUM CHLORIDE 0.9 % IV SOLN
Freq: Once | INTRAVENOUS | Status: AC
  Administered 2014-08-14: 17:00:00 via INTRAVENOUS
  Filled 2014-08-14: qty 4

## 2014-08-14 MED ORDER — MEPERIDINE HCL 25 MG/ML IJ SOLN
12.5000 mg | INTRAMUSCULAR | Status: AC
  Administered 2014-08-14: 12.5 mg via INTRAVENOUS

## 2014-08-14 MED ORDER — ACETAMINOPHEN 325 MG PO TABS
ORAL_TABLET | ORAL | Status: AC
  Filled 2014-08-14: qty 2

## 2014-08-14 NOTE — Patient Instructions (Addendum)
[facility] Discharge Instructions for Patients Receiving Chemotherapy  Today you received the following chemotherapy agents rituxan, bendamustine  To help prevent nausea and vomiting after your treatment, we encourage you to take your nausea medication compazine tonight and evaluate in am if need again. Will be receiving ondansetron in infusion room on Friday   If you develop nausea and vomiting that is not controlled by your nausea medication, call the clinic.   BELOW ARE SYMPTOMS THAT SHOULD BE REPORTED IMMEDIATELY:  *FEVER GREATER THAN 100.5 F  *CHILLS WITH OR WITHOUT FEVER  NAUSEA AND VOMITING THAT IS NOT CONTROLLED WITH YOUR NAUSEA MEDICATION  *UNUSUAL SHORTNESS OF BREATH  *UNUSUAL BRUISING OR BLEEDING  TENDERNESS IN MOUTH AND THROAT WITH OR WITHOUT PRESENCE OF ULCERS  *URINARY PROBLEMS  *BOWEL PROBLEMS  UNUSUAL RASH Items with * indicate a potential emergency and should be followed up as soon as possible.  Feel free to call the clinic you have any questions or concerns. The clinic phone number is [phone].  Please show the [place] at check-in to the Emergency Department and triage nurse.

## 2014-08-14 NOTE — Progress Notes (Signed)
Patient Care Team: Marletta Lor, MD as PCP - General  DIAGNOSIS: Breast cancer, right breast   Staging form: Breast, AJCC 7th Edition     Clinical: Tis (DCIS), NX, cM0 - Unsigned   SUMMARY OF ONCOLOGIC HISTORY:   Breast cancer, right breast   04/14/2011 Surgery Right mastectomy: 2 foci of invasive ductal carcinoma 0.2 cm, grade 1 with background extensive DCIS, 0/5 lymph nodes negative,focus 1: ER 94%, PR 100%, Ki-67 5%, HER-2 negative: Focus to ER 100%BR 6% Ki-67 29% HER-2 negative   05/02/2011 -  Anti-estrogen oral therapy letrozole 2.5 mg daily    Follicular 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   07/24/2014 Initial Diagnosis Retroperitoneal mass biopsy left side: Follicular lymphoma low-grade grade 1-2, flow cytometry positive for CD10, 20, 3, 10, 5, BCL-2, BCl-6, CD21, Ki-67 less than 10%    CHIEF COMPLIANT: Cycle 1 Bendamustine Rituxan  INTERVAL HISTORY: Selena [name] is a 78 year old with above-mentioned history of intra-abdominal follicular non-Hodgkin's lymphoma who is here today to start cycle 1 of chemotherapy with bendamustine and Rituxan. She had completed chemotherapy education and has a port in place. All of her antiemetics have been filled. She is anxious to get started.  REVIEW OF SYSTEMS:   Constitutional: Denies fevers, chills or abnormal weight loss Eyes: Denies blurriness of vision Ears, nose, mouth, throat, and face: Denies mucositis or sore throat Respiratory: Denies cough, dyspnea or wheezes Cardiovascular: Denies palpitation, chest discomfort or lower extremity swelling Gastrointestinal:  Denies nausea, heartburn or change in bowel habits Skin: Denies abnormal skin rashes Lymphatics: Denies new lymphadenopathy or easy bruising Neurological:Denies numbness, tingling or new weaknesses Behavioral/Psych: Mood is stable, no new changes   All other systems were reviewed with the patient and are negative.  I have reviewed the past medical history,  past surgical history, social history and family history with the patient and they are unchanged from previous note.  ALLERGIES:  is allergic to codeine and codeine phosphate.  MEDICATIONS:  Current Outpatient Prescriptions  Medication Sig Dispense Refill  . acyclovir (ZOVIRAX) 400 MG tablet Take 1 tablet (400 mg total) by mouth daily. 30 tablet 3  . cholecalciferol (VITAMIN D) 1000 UNITS tablet Take 1,000 Units by mouth daily.    [name] dexamethasone (DECADRON) 4 MG tablet Take 1 tablet (4 mg total) by mouth daily. Start the day after chemotherapy for 2 days. Take with food. 30 tablet 1  . diclofenac (VOLTAREN) 75 MG EC tablet TAKE 1 TABLET BY MOUTH TWICE DAILY (Patient taking differently: Take 75 mg by mouth daily. ) 180 tablet 3  . HYDROcodone-acetaminophen (NORCO) 10-325 MG per tablet Take 1 tablet by mouth every 6 (six) hours as needed. 10 tablet 0  . letrozole (FEMARA) 2.5 MG tablet Take 1 tablet (2.5 mg total) by mouth daily. 90 tablet 3  . lidocaine-prilocaine (EMLA) cream Apply to affected area once (Patient taking differently: Apply 1 application topically once. For chemo port) 30 g 3  . LORazepam (ATIVAN) 0.5 MG tablet Take 1 tablet (0.5 mg total) by mouth every 8 (eight) hours as needed (Nausea or vomiting). (Patient taking differently: Take 0.5 mg by mouth every 8 (eight) hours as needed for anxiety. ) 60 tablet 0  . ondansetron (ZOFRAN) 8 MG tablet Take 1 tablet (8 mg total) by mouth 2 (two) times daily. Start the day after chemo for 2 days. Then take as needed for nausea or vomiting. 30 tablet 1  . prochlorperazine (COMPAZINE) 10 MG tablet Take 1 tablet (10  mg total) by mouth every 6 (six) hours as needed (Nausea or vomiting). 30 tablet 1  . UNABLE TO FIND Rx: L8000- Post Surgical Bras (Quantity: 6) X3235- Silicone Breast Prosthesis (Quantity: 1) Dx: 174.9; Right Mastectomy 1 each 0   No current facility-administered medications for this visit.    PHYSICAL EXAMINATION: ECOG  PERFORMANCE STATUS: 1 - Symptomatic but completely ambulatory  Filed Vitals:   08/14/14 0945  BP: 146/66  Pulse: 88  Temp: 98.5 F (36.9 C)  Resp: 18   Filed Weights   08/14/14 0945  Weight: 208 lb (94.348 kg)    GENERAL:alert, no distress and comfortable SKIN: skin color, texture, turgor are normal, no rashes or significant lesions EYES: normal, Conjunctiva are pink and non-injected, sclera clear OROPHARYNX:no exudate, no erythema and lips, buccal mucosa, and tongue normal  NECK: supple, thyroid normal size, non-tender, without nodularity LYMPH:  no palpable lymphadenopathy in the cervical, axillary or inguinal LUNGS: clear to auscultation and percussion with normal breathing effort HEART: regular rate & rhythm and no murmurs and no lower extremity edema ABDOMEN:abdomen soft, non-tender and normal bowel sounds Musculoskeletal:no cyanosis of digits and no clubbing  NEURO: alert & oriented x 3 with fluent speech, no focal motor/sensory deficits   LABORATORY DATA:  I have reviewed the data as listed   Chemistry      Component Value Date/Time   NA 144 08/14/2014 0934   NA 141 08/12/2013 0923   K 4.2 08/14/2014 0934   K 4.2 08/12/2013 0923   CL 106 08/12/2013 0923   CL 107 04/05/2012 1253   CO2 27 08/14/2014 0934   CO2 27 08/12/2013 0923   BUN 15.3 08/14/2014 0934   BUN 20 08/12/2013 0923   CREATININE 0.7 08/14/2014 0934   CREATININE 0.6 08/12/2013 0923      Component Value Date/Time   CALCIUM 9.5 08/14/2014 0934   CALCIUM 9.8 08/12/2013 0923   ALKPHOS 100 08/14/2014 0934   ALKPHOS 106 08/12/2013 0923   AST 37* 08/14/2014 0934   AST 42* 08/12/2013 0923   ALT 43 08/14/2014 0934   ALT 52* 08/12/2013 0923   BILITOT 0.71 08/14/2014 0934   BILITOT 0.7 08/12/2013 0923       Lab Results  Component Value Date   WBC 7.6 08/14/2014   HGB 13.9 08/14/2014   HCT 41.4 08/14/2014   MCV 95.8 08/14/2014   PLT 151 08/14/2014   NEUTROABS 5.0 08/14/2014    ASSESSMENT &  PLAN:  Follicular 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 Retroperitoneal mass biopsy left side 57/32/2025: Follicular lymphoma low-grade grade 1-2, flow cytometry positive for CD10, 20, 3, 10, 5, BCL-2, BCl-6, CD21, Ki-67 less than 10%, stage IB (8X 4 cm mass)  Treatment plan:systemic chemotherapy with bendamustine and Rituxan 6 cycles followed by Rituxan maintenance every 2 months for 2 years  Current treatment: Cycle 1 day 1 bendamustine and Rituxan Chemotherapy monitoring: 1. Port has been placed 2. Hepatitis B and C negative 3. Completed chemotherapy education class  Severe anxiety: Previously prescribed Ativan Return to clinic in 1 week for toxicity check and follow-up.     No orders of the defined types were placed in this encounter.   The patient has a good understanding of the overall plan. she agrees with it. she will call with any problems that may develop before the next visit here.   Rulon Eisenmenger, MD

## 2014-08-14 NOTE — Progress Notes (Signed)
1315 15 minutes into 150 mg/hr titration (3rd "bump up" at 62 ml/hr), patient asking for blanket due to feeling cold: RN notes rigors. Rituxan paused, NS started wide open.  Selena Lesser, NP notified.  1321 solumedrol 125 mg given IVP 1324 demerol 12.5 mg given IVP per Selena Lesser, NP 1326 VSS 161/79, HR 87, RR 18, O2 97% 1324 pepcid 25 mg given IVPB 1339 rigors not improved; demerol 12.5 mg given IVP per Selena Lesser, NP 1343 benadryl 25 mg given IVP; VSS 159/77, HR 81, RR 20; rigors improving 1440 symptoms returned to baseline, no rigors noted; VSS; OK to restart at previously tolerated rate per Selena Lesser, NP.  Dr. Lindi Adie informed infusion will complete tonight nearing 1900. Per MD, consult with pharmacy and if OK, give patient 1/2 dose Rituxan and Bendeka today and other 1/2 dose Rituxan and Bendeka on day 2. Charge RN, Burman Nieves, and [place] notified. Per Montel Clock, PharmD, discontinue Rituxan after completion of 250 mg/hr dose titration and proceed with premeds + Bendeka on day 1.

## 2014-08-14 NOTE — Telephone Encounter (Signed)
Gave avs & calendar for August °

## 2014-08-15 ENCOUNTER — Ambulatory Visit (HOSPITAL_BASED_OUTPATIENT_CLINIC_OR_DEPARTMENT_OTHER): Payer: Medicare Other

## 2014-08-15 VITALS — BP 139/78 | HR 81 | Temp 98.2°F | Resp 18

## 2014-08-15 DIAGNOSIS — C8203 Follicular lymphoma grade I, intra-abdominal lymph nodes: Secondary | ICD-10-CM | POA: Diagnosis not present

## 2014-08-15 DIAGNOSIS — Z5111 Encounter for antineoplastic chemotherapy: Secondary | ICD-10-CM

## 2014-08-15 DIAGNOSIS — Z5112 Encounter for antineoplastic immunotherapy: Secondary | ICD-10-CM

## 2014-08-15 MED ORDER — DIPHENHYDRAMINE HCL 25 MG PO CAPS
ORAL_CAPSULE | ORAL | Status: AC
  Filled 2014-08-15: qty 2

## 2014-08-15 MED ORDER — HEPARIN SOD (PORK) LOCK FLUSH 100 UNIT/ML IV SOLN
500.0000 [IU] | Freq: Once | INTRAVENOUS | Status: DC | PRN
  Filled 2014-08-15: qty 5

## 2014-08-15 MED ORDER — SODIUM CHLORIDE 0.9 % IV SOLN
187.0000 mg/m2 | Freq: Once | INTRAVENOUS | Status: AC
  Administered 2014-08-15: 400 mg via INTRAVENOUS
  Filled 2014-08-15: qty 40

## 2014-08-15 MED ORDER — SODIUM CHLORIDE 0.9 % IV SOLN
75.0000 mg/m2 | Freq: Once | INTRAVENOUS | Status: AC
  Administered 2014-08-15: 150 mg via INTRAVENOUS
  Filled 2014-08-15: qty 6

## 2014-08-15 MED ORDER — DIPHENHYDRAMINE HCL 25 MG PO CAPS
50.0000 mg | ORAL_CAPSULE | Freq: Once | ORAL | Status: AC
  Administered 2014-08-15: 50 mg via ORAL

## 2014-08-15 MED ORDER — ACETAMINOPHEN 325 MG PO TABS
ORAL_TABLET | ORAL | Status: AC
  Filled 2014-08-15: qty 2

## 2014-08-15 MED ORDER — SODIUM CHLORIDE 0.9 % IV SOLN
Freq: Once | INTRAVENOUS | Status: AC
  Administered 2014-08-15: 15:00:00 via INTRAVENOUS
  Filled 2014-08-15: qty 4

## 2014-08-15 MED ORDER — SODIUM CHLORIDE 0.9 % IV SOLN
Freq: Once | INTRAVENOUS | Status: AC
  Administered 2014-08-15: 12:00:00 via INTRAVENOUS

## 2014-08-15 MED ORDER — ACETAMINOPHEN 325 MG PO TABS
650.0000 mg | ORAL_TABLET | Freq: Once | ORAL | Status: AC
  Administered 2014-08-15: 650 mg via ORAL

## 2014-08-15 MED ORDER — SODIUM CHLORIDE 0.9 % IJ SOLN
10.0000 mL | INTRAMUSCULAR | Status: DC | PRN
  Filled 2014-08-15: qty 10

## 2014-08-15 NOTE — Progress Notes (Signed)
No issues/complaints with rituxan/bendumustine. Patient discharged in stable condition.

## 2014-08-15 NOTE — Patient Instructions (Signed)
[facility] Discharge Instructions for Patients Receiving Chemotherapy  Today you received the following chemotherapy agents Rituximab.  To help prevent nausea and vomiting after your treatment, we encourage you to take your nausea medication as directed.   If you develop nausea and vomiting that is not controlled by your nausea medication, call the clinic.   BELOW ARE SYMPTOMS THAT SHOULD BE REPORTED IMMEDIATELY:  *FEVER GREATER THAN 100.5 F  *CHILLS WITH OR WITHOUT FEVER  NAUSEA AND VOMITING THAT IS NOT CONTROLLED WITH YOUR NAUSEA MEDICATION  *UNUSUAL SHORTNESS OF BREATH  *UNUSUAL BRUISING OR BLEEDING  TENDERNESS IN MOUTH AND THROAT WITH OR WITHOUT PRESENCE OF ULCERS  *URINARY PROBLEMS  *BOWEL PROBLEMS  UNUSUAL RASH Items with * indicate a potential emergency and should be followed up as soon as possible.  Feel free to call the clinic you have any questions or concerns. The clinic phone number is [phone].  Please show the CHEMO ALERT CARD at check-in to the Emergency Department and triage nurse.    

## 2014-08-18 ENCOUNTER — Telehealth: Payer: Self-pay

## 2014-08-18 NOTE — Telephone Encounter (Signed)
Spoke with patient.  Pt denies nausea, vomiting, fevers, pain.  Pt reports nervousness and inability to sleep.  Advised pt is most likely from the steroids and if these symptoms do not resolve in the next few days she should call the clinic.  Pt voiced understanding.

## 2014-08-18 NOTE — Telephone Encounter (Signed)
-----   Message from Ronnette Juniper, RN sent at 08/15/2014  3:39 PM EDT ----- Regarding: 1st treatment 1st treatment: Rituxan/Treanda.   Gud[place]  [phone] (H)

## 2014-08-20 ENCOUNTER — Encounter: Payer: Self-pay | Admitting: Hematology and Oncology

## 2014-08-20 ENCOUNTER — Other Ambulatory Visit (HOSPITAL_BASED_OUTPATIENT_CLINIC_OR_DEPARTMENT_OTHER): Payer: Medicare Other

## 2014-08-20 ENCOUNTER — Telehealth: Payer: Self-pay | Admitting: Hematology and Oncology

## 2014-08-20 ENCOUNTER — Ambulatory Visit (HOSPITAL_BASED_OUTPATIENT_CLINIC_OR_DEPARTMENT_OTHER): Payer: Medicare Other | Admitting: Hematology and Oncology

## 2014-08-20 VITALS — BP 151/65 | HR 85 | Temp 98.0°F | Resp 18 | Ht 67.0 in | Wt 204.4 lb

## 2014-08-20 DIAGNOSIS — F418 Other specified anxiety disorders: Secondary | ICD-10-CM | POA: Diagnosis not present

## 2014-08-20 DIAGNOSIS — C50911 Malignant neoplasm of unspecified site of right female breast: Secondary | ICD-10-CM

## 2014-08-20 DIAGNOSIS — D0511 Intraductal carcinoma in situ of right breast: Secondary | ICD-10-CM

## 2014-08-20 DIAGNOSIS — C8203 Follicular lymphoma grade I, intra-abdominal lymph nodes: Secondary | ICD-10-CM

## 2014-08-20 LAB — CBC WITH DIFFERENTIAL/PLATELET
BASO%: 0.3 % (ref 0.0–2.0)
Basophils Absolute: 0 10*3/uL (ref 0.0–0.1)
EOS%: 5.4 % (ref 0.0–7.0)
Eosinophils Absolute: 0.5 10*3/uL (ref 0.0–0.5)
HEMATOCRIT: 45.1 % (ref 34.8–46.6)
HEMOGLOBIN: 15.1 g/dL (ref 11.6–15.9)
LYMPH%: 3.1 % — AB (ref 14.0–49.7)
MCH: 32 pg (ref 25.1–34.0)
MCHC: 33.5 g/dL (ref 31.5–36.0)
MCV: 95.3 fL (ref 79.5–101.0)
MONO#: 0.9 10*3/uL (ref 0.1–0.9)
MONO%: 9.7 % (ref 0.0–14.0)
NEUT#: 7.9 10*3/uL — ABNORMAL HIGH (ref 1.5–6.5)
NEUT%: 81.5 % — AB (ref 38.4–76.8)
PLATELETS: 190 10*3/uL (ref 145–400)
RBC: 4.73 10*6/uL (ref 3.70–5.45)
RDW: 13.1 % (ref 11.2–14.5)
WBC: 9.7 10*3/uL (ref 3.9–10.3)
lymph#: 0.3 10*3/uL — ABNORMAL LOW (ref 0.9–3.3)

## 2014-08-20 LAB — COMPREHENSIVE METABOLIC PANEL (CC13)
ALT: 58 U/L — ABNORMAL HIGH (ref 0–55)
ANION GAP: 9 meq/L (ref 3–11)
AST: 51 U/L — AB (ref 5–34)
Albumin: 3.8 g/dL (ref 3.5–5.0)
Alkaline Phosphatase: 110 U/L (ref 40–150)
BUN: 24.1 mg/dL (ref 7.0–26.0)
CO2: 28 mEq/L (ref 22–29)
Calcium: 9.8 mg/dL (ref 8.4–10.4)
Chloride: 105 mEq/L (ref 98–109)
Creatinine: 0.8 mg/dL (ref 0.6–1.1)
EGFR: 77 mL/min/{1.73_m2} — ABNORMAL LOW (ref >90)
Glucose: 97 mg/dl (ref 70–140)
Potassium: 5 mEq/L (ref 3.5–5.1)
SODIUM: 142 meq/L (ref 136–145)
Total Bilirubin: 0.87 mg/dL (ref 0.20–1.20)
Total Protein: 6.7 g/dL (ref 6.4–8.3)

## 2014-08-20 NOTE — Assessment & Plan Note (Signed)
Retroperitoneal mass biopsy left side 99/69/2493: Follicular lymphoma low-grade grade 1-2, flow cytometry positive for CD10, 20, 3, 10, 5, BCL-2, BCl-6, CD21, Ki-67 less than 10%, stage IB (8X 4 cm mass)  Treatment plan:systemic chemotherapy with bendamustine and Rituxan 6 cycles followed by Rituxan maintenance every 2 months for 2 years  Current treatment: Cycle 1 day 8 bendamustine and Rituxan Chemotherapy toxicities: 1. First infusion reaction to Rituxan 2. Fatigue 3. Anxiety due to dexamethasone: I discontinued oral dexamethasone for the next chemotherapy.  Severe anxiety: Previously prescribed Ativan. I instructed her to take 2 tablets of Ativan at bedtime for sleep Return to clinic in 3 week for follow-up with our nurse practitioner for cycle 2 of chemotherapy.  I discussed with her that she could be seen on day 1 of each cycle of treatment.

## 2014-08-20 NOTE — Progress Notes (Signed)
Patient Care Team: Marletta Lor, MD as PCP - General  DIAGNOSIS: Breast cancer, right breast   Staging form: Breast, AJCC 7th Edition     Clinical: Tis (DCIS), NX, cM0 - Unsigned   SUMMARY OF ONCOLOGIC HISTORY:   Breast cancer, right breast   04/14/2011 Surgery Right mastectomy: 2 foci of invasive ductal carcinoma 0.2 cm, grade 1 with background extensive DCIS, 0/5 lymph nodes negative,focus 1: ER 94%, PR 100%, Ki-67 5%, HER-2 negative: Focus to ER 100%BR 6% Ki-67 29% HER-2 negative   05/02/2011 -  Anti-estrogen oral therapy letrozole 2.5 mg daily    Follicular 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   07/24/2014 Initial Diagnosis Retroperitoneal mass biopsy left side: Follicular lymphoma low-grade grade 1-2, flow cytometry positive for CD10, 20, 3, 10, 5, BCL-2, BCl-6, CD21, Ki-67 less than 10%   08/13/2014 -  Chemotherapy Bendamustine and Rituxan day 1, 2 every 4 weeks 6 cycles followed by Rituxan maintenance every 2 months 2 years    CHIEF COMPLIANT: Cycle 1 day bendamustine Rituxan  INTERVAL HISTORY: Selena [name] is a 79 year old with above-mentioned history of follicular lymphoma within intra-abdominal mass currently on chemotherapy with bendamustine and Rituxan. He received cycle 1 of treatment last week and had first infusion reaction which was with chills. Subsequently she did well on day 2. She went home and felt fatigued for a few days. She did not have any nausea or vomiting. She reports the dexamethasone made her very anxious and difficulty with sleep. She did take Ativan but it did not resolve her insomnia.  REVIEW OF SYSTEMS:   Constitutional: Denies fevers, chills or abnormal weight loss Eyes: Denies blurriness of vision Ears, nose, mouth, throat, and face: Denies mucositis or sore throat Respiratory: Denies cough, dyspnea or wheezes Cardiovascular: Denies palpitation, chest discomfort or lower extremity swelling Gastrointestinal:  Denies nausea, heartburn or  change in bowel habits Skin: Denies abnormal skin rashes Lymphatics: Denies new lymphadenopathy or easy bruising Neurological:Denies numbness, tingling or new weaknesses Behavioral/Psych: Mood is stable, no new changes  All other systems were reviewed with the patient and are negative.  I have reviewed the past medical history, past surgical history, social history and family history with the patient and they are unchanged from previous note.  ALLERGIES:  is allergic to codeine and codeine phosphate.  MEDICATIONS:  Current Outpatient Prescriptions  Medication Sig Dispense Refill  . acyclovir (ZOVIRAX) 400 MG tablet Take 1 tablet (400 mg total) by mouth daily. 30 tablet 3  . cholecalciferol (VITAMIN D) 1000 UNITS tablet Take 1,000 Units by mouth daily.    [name] dexamethasone (DECADRON) 4 MG tablet   1  . diclofenac (VOLTAREN) 75 MG EC tablet TAKE 1 TABLET BY MOUTH TWICE DAILY (Patient taking differently: Take 75 mg by mouth daily. ) 180 tablet 3  . HYDROcodone-acetaminophen (NORCO) 10-325 MG per tablet TAKE 1 TABLET BY MOUTH EV ERY 6 HOURS AS NEEDED  0  . letrozole (FEMARA) 2.5 MG tablet TK 1 T PO  QD  3  . lidocaine-prilocaine (EMLA) cream Apply to affected area once (Patient taking differently: Apply 1 application topically once. For chemo port) 30 g 3  . LORazepam (ATIVAN) 0.5 MG tablet Take 1 tablet (0.5 mg total) by mouth every 8 (eight) hours as needed (Nausea or vomiting). (Patient taking differently: Take 0.5 mg by mouth every 8 (eight) hours as needed for anxiety. ) 60 tablet 0  . ondansetron (ZOFRAN) 8 MG tablet Take 1 tablet (8 mg total)  by mouth 2 (two) times daily. Start the day after chemo for 2 days. Then take as needed for nausea or vomiting. 30 tablet 1  . prochlorperazine (COMPAZINE) 10 MG tablet Take 1 tablet (10 mg total) by mouth every 6 (six) hours as needed (Nausea or vomiting). 30 tablet 1  . UNABLE TO FIND Rx: L8000- Post Surgical Bras (Quantity: 6) [id number]- Silicone Breast  Prosthesis (Quantity: 1) Dx: 174.9; Right Mastectomy 1 each 0   No current facility-administered medications for this visit.    PHYSICAL EXAMINATION: ECOG PERFORMANCE STATUS: 1 - Symptomatic but completely ambulatory  Filed Vitals:   08/20/14 1500  BP: 151/65  Pulse: 85  Temp: 98 F (36.7 C)  Resp: 18   Filed Weights   08/20/14 1500  Weight: 204 lb 6.4 oz (92.715 kg)    GENERAL:alert, no distress and comfortable SKIN: skin color, texture, turgor are normal, no rashes or significant lesions EYES: normal, Conjunctiva are pink and non-injected, sclera clear OROPHARYNX:no exudate, no erythema and lips, buccal mucosa, and tongue normal  NECK: supple, thyroid normal size, non-tender, without nodularity LYMPH:  no palpable lymphadenopathy in the cervical, axillary or inguinal LUNGS: clear to auscultation and percussion with normal breathing effort HEART: regular rate & rhythm and no murmurs and no lower extremity edema ABDOMEN:abdomen soft, non-tender and normal bowel sounds Musculoskeletal:no cyanosis of digits and no clubbing  NEURO: alert & oriented x 3 with fluent speech, no focal motor/sensory deficits  LABORATORY DATA:  I have reviewed the data as listed   Chemistry      Component Value Date/Time   NA 144 08/14/2014 0934   NA 141 08/12/2013 0923   K 4.2 08/14/2014 0934   K 4.2 08/12/2013 0923   CL 106 08/12/2013 0923   CL 107 04/05/2012 1253   CO2 27 08/14/2014 0934   CO2 27 08/12/2013 0923   BUN 15.3 08/14/2014 0934   BUN 20 08/12/2013 0923   CREATININE 0.7 08/14/2014 0934   CREATININE 0.6 08/12/2013 0923      Component Value Date/Time   CALCIUM 9.5 08/14/2014 0934   CALCIUM 9.8 08/12/2013 0923   ALKPHOS 100 08/14/2014 0934   ALKPHOS 106 08/12/2013 0923   AST 37* 08/14/2014 0934   AST 42* 08/12/2013 0923   ALT 43 08/14/2014 0934   ALT 52* 08/12/2013 0923   BILITOT 0.71 08/14/2014 0934   BILITOT 0.7 08/12/2013 0923       Lab Results  Component Value  Date   WBC 9.7 08/20/2014   HGB 15.1 08/20/2014   HCT 45.1 08/20/2014   MCV 95.3 08/20/2014   PLT 190 08/20/2014   NEUTROABS 7.9* 08/20/2014   ASSESSMENT & PLAN:  Breast cancer, right breast Letrozole is on hold while the patient is on chemotherapy for follicular lymphoma  Follicular 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 Retroperitoneal mass biopsy left side 56/38/7564: Follicular lymphoma low-grade grade 1-2, flow cytometry positive for CD10, 20, 3, 10, 5, BCL-2, BCl-6, CD21, Ki-67 less than 10%, stage IB (8X 4 cm mass)  Treatment plan:systemic chemotherapy with bendamustine and Rituxan 6 cycles followed by Rituxan maintenance every 2 months for 2 years  Current treatment: Cycle 1 day 8 bendamustine and Rituxan Chemotherapy toxicities: 1. First infusion reaction to Rituxan 2. Fatigue 3. Anxiety due to dexamethasone: I discontinued oral dexamethasone for the next chemotherapy.  Severe anxiety: Previously prescribed Ativan. I instructed her to take 2 tablets of Ativan at bedtime for sleep Return to clinic in 3 week for follow-up  with our nurse practitioner for cycle 2 of chemotherapy.  I discussed with her that she could be seen on day 1 of each cycle of treatment.    No orders of the defined types were placed in this encounter.   The patient has a good understanding of the overall plan. she agrees with it. she will call with any problems that may develop before the next visit here.   Rulon Eisenmenger, MD

## 2014-08-20 NOTE — Assessment & Plan Note (Addendum)
Letrozole is on hold while the patient is on chemotherapy for follicular lymphoma

## 2014-08-20 NOTE — Telephone Encounter (Signed)
Appointments made and avs printed for patient °

## 2014-09-08 ENCOUNTER — Ambulatory Visit: Payer: Medicare Other

## 2014-09-09 ENCOUNTER — Ambulatory Visit: Payer: Medicare Other

## 2014-09-10 ENCOUNTER — Telehealth: Payer: Self-pay | Admitting: Nurse Practitioner

## 2014-09-10 ENCOUNTER — Ambulatory Visit (HOSPITAL_BASED_OUTPATIENT_CLINIC_OR_DEPARTMENT_OTHER): Payer: Medicare Other | Admitting: Nurse Practitioner

## 2014-09-10 ENCOUNTER — Other Ambulatory Visit (HOSPITAL_BASED_OUTPATIENT_CLINIC_OR_DEPARTMENT_OTHER): Payer: Medicare Other

## 2014-09-10 ENCOUNTER — Encounter: Payer: Self-pay | Admitting: Nurse Practitioner

## 2014-09-10 VITALS — BP 153/60 | HR 78 | Temp 98.0°F | Resp 18 | Ht 67.0 in | Wt 207.0 lb

## 2014-09-10 DIAGNOSIS — D0511 Intraductal carcinoma in situ of right breast: Secondary | ICD-10-CM

## 2014-09-10 DIAGNOSIS — F5104 Psychophysiologic insomnia: Secondary | ICD-10-CM | POA: Insufficient documentation

## 2014-09-10 DIAGNOSIS — G4700 Insomnia, unspecified: Secondary | ICD-10-CM | POA: Diagnosis not present

## 2014-09-10 DIAGNOSIS — F419 Anxiety disorder, unspecified: Secondary | ICD-10-CM | POA: Diagnosis not present

## 2014-09-10 DIAGNOSIS — C8203 Follicular lymphoma grade I, intra-abdominal lymph nodes: Secondary | ICD-10-CM

## 2014-09-10 DIAGNOSIS — C50911 Malignant neoplasm of unspecified site of right female breast: Secondary | ICD-10-CM

## 2014-09-10 DIAGNOSIS — F329 Major depressive disorder, single episode, unspecified: Secondary | ICD-10-CM

## 2014-09-10 HISTORY — DX: Insomnia, unspecified: G47.00

## 2014-09-10 LAB — CBC WITH DIFFERENTIAL/PLATELET
BASO%: 1.2 % (ref 0.0–2.0)
Basophils Absolute: 0.1 10*3/uL (ref 0.0–0.1)
EOS ABS: 0.7 10*3/uL — AB (ref 0.0–0.5)
EOS%: 9.2 % — ABNORMAL HIGH (ref 0.0–7.0)
HCT: 43.7 % (ref 34.8–46.6)
HGB: 14.7 g/dL (ref 11.6–15.9)
LYMPH%: 16 % (ref 14.0–49.7)
MCH: 32.4 pg (ref 25.1–34.0)
MCHC: 33.6 g/dL (ref 31.5–36.0)
MCV: 96.3 fL (ref 79.5–101.0)
MONO#: 1 10*3/uL — AB (ref 0.1–0.9)
MONO%: 13 % (ref 0.0–14.0)
NEUT#: 4.7 10*3/uL (ref 1.5–6.5)
NEUT%: 60.6 % (ref 38.4–76.8)
PLATELETS: 153 10*3/uL (ref 145–400)
RBC: 4.53 10*6/uL (ref 3.70–5.45)
RDW: 13.4 % (ref 11.2–14.5)
WBC: 7.7 10*3/uL (ref 3.9–10.3)
lymph#: 1.2 10*3/uL (ref 0.9–3.3)

## 2014-09-10 LAB — COMPREHENSIVE METABOLIC PANEL (CC13)
ALT: 77 U/L — ABNORMAL HIGH (ref 0–55)
ANION GAP: 9 meq/L (ref 3–11)
AST: 76 U/L — ABNORMAL HIGH (ref 5–34)
Albumin: 3.7 g/dL (ref 3.5–5.0)
Alkaline Phosphatase: 108 U/L (ref 40–150)
BILIRUBIN TOTAL: 0.61 mg/dL (ref 0.20–1.20)
BUN: 17.8 mg/dL (ref 7.0–26.0)
CALCIUM: 9.3 mg/dL (ref 8.4–10.4)
CHLORIDE: 109 meq/L (ref 98–109)
CO2: 24 meq/L (ref 22–29)
Creatinine: 0.8 mg/dL (ref 0.6–1.1)
EGFR: 75 mL/min/{1.73_m2} — AB (ref >90)
Glucose: 117 mg/dl (ref 70–140)
Potassium: 4.2 mEq/L (ref 3.5–5.1)
Sodium: 142 mEq/L (ref 136–145)
TOTAL PROTEIN: 6.5 g/dL (ref 6.4–8.3)

## 2014-09-10 MED ORDER — VENLAFAXINE HCL ER 75 MG PO CP24: 75.0000 mg | ORAL_CAPSULE | Freq: Every day | ORAL | Status: DC

## 2014-09-10 NOTE — Assessment & Plan Note (Addendum)
Retroperitoneal mass biopsy left side 56/97/9480: Follicular lymphoma low-grade grade 1-2, flow cytometry positive for CD10, 20, 3, 10, 5, BCL-2, BCl-6, CD21, Ki-67 less than 10%, stage IB (8X 4 cm mass)  Treatment plan:systemic chemotherapy with bendamustine and Rituxan 6 cycles followed by Rituxan maintenance every 2 months for 2 years  Current treatment: Cycle 2 bendamustine and Rituxan Chemotherapy toxicities: 1. First infusion reaction to Rituxan 2. Fatigue 3. Anxiety due to dexamethasone: I discontinued oral dexamethasone for the next chemotherapy. 4. Insomnia: benadryl QHS Severe anxiety: Previously prescribed Ativan. instructed to take 2 tablets of Ativan at bedtime for sleep. We discussed venlafaxine and as and SNRI this may improve her symptoms better than ativan alone. She will begin 42m daily. She understands this will take 2-3 weeks to build up into her system.    Return to clinic in 4 weeks for follow-up prior to cycle 3 of treatment

## 2014-09-10 NOTE — Telephone Encounter (Signed)
Appointments made and avs printed for patient °

## 2014-09-10 NOTE — Progress Notes (Signed)
Patient Care Team: Marletta Lor, MD as PCP - General  DIAGNOSIS: Breast cancer, right breast   Staging form: Breast, AJCC 7th Edition     Clinical: Tis (DCIS), NX, cM0 - Unsigned   SUMMARY OF ONCOLOGIC HISTORY:   Breast cancer, right breast   04/14/2011 Surgery Right mastectomy: 2 foci of invasive ductal carcinoma 0.2 cm, grade 1 with background extensive DCIS, 0/5 lymph nodes negative,focus 1: ER 94%, PR 100%, Ki-67 5%, HER-2 negative: Focus to ER 100%BR 6% Ki-67 29% HER-2 negative   05/02/2011 -  Anti-estrogen oral therapy letrozole 2.5 mg daily    Follicular 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   07/24/2014 Initial Diagnosis Retroperitoneal mass biopsy left side: Follicular lymphoma low-grade grade 1-2, flow cytometry positive for CD10, 20, 3, 10, 5, BCL-2, BCl-6, CD21, Ki-67 less than 10%   08/13/2014 -  Chemotherapy Bendamustine and Rituxan day 1, 2 every 4 weeks 6 cycles followed by Rituxan maintenance every 2 months 2 years    CHIEF COMPLIANT: Cycle 2 Bendamustine Rituxan  INTERVAL HISTORY: Selena [name] is a 79 year old with above-mentioned history of follicular lymphoma within intra-abdominal mass currently on chemotherapy with bendamustine and Rituxan. She tolerated her first cycle well with no incidents, besides fatigue. She denies fevers, chills, nausea, vomiting, or changes in bowel or bladder habits. She has no mouth sores or rashes. Her main complaints today are insomnia and continued anxiety during the day and at night. She takes ativan, but this has not improved either symptom. She endorses depression as well.  REVIEW OF SYSTEMS:   Constitutional: Denies fevers, chills or abnormal weight loss Eyes: Denies blurriness of vision Ears, nose, mouth, throat, and face: Denies mucositis or sore throat Respiratory: Denies cough, dyspnea or wheezes Cardiovascular: Denies palpitation, chest discomfort or lower extremity swelling Gastrointestinal:  Denies nausea,  heartburn or change in bowel habits Skin: Denies abnormal skin rashes Lymphatics: Denies new lymphadenopathy or easy bruising Neurological:Denies numbness, tingling or new weaknesses Behavioral/Psych: Mood is stable, no new changes  All other systems were reviewed with the patient and are negative.  I have reviewed the past medical history, past surgical history, social history and family history with the patient and they are unchanged from previous note.  ALLERGIES:  is allergic to codeine and codeine phosphate.  MEDICATIONS:  Current Outpatient Prescriptions  Medication Sig Dispense Refill  . acyclovir (ZOVIRAX) 400 MG tablet Take 1 tablet (400 mg total) by mouth daily. 30 tablet 3  . cholecalciferol (VITAMIN D) 1000 UNITS tablet Take 1,000 Units by mouth daily.    . diclofenac (VOLTAREN) 75 MG EC tablet TAKE 1 TABLET BY MOUTH TWICE DAILY (Patient taking differently: Take 75 mg by mouth daily. ) 180 tablet 3  . lidocaine-prilocaine (EMLA) cream Apply to affected area once (Patient taking differently: Apply 1 application topically once. For chemo port) 30 g 3  . LORazepam (ATIVAN) 0.5 MG tablet Take 1 tablet (0.5 mg total) by mouth every 8 (eight) hours as needed (Nausea or vomiting). (Patient taking differently: Take 0.5 mg by mouth every 8 (eight) hours as needed for anxiety. ) 60 tablet 0  . UNABLE TO FIND Rx: L8000- Post Surgical Bras (Quantity: 6) H2094- Silicone Breast Prosthesis (Quantity: 1) Dx: 174.9; Right Mastectomy 1 each 0  . HYDROcodone-acetaminophen (NORCO) 10-325 MG per tablet TAKE 1 TABLET BY MOUTH EV ERY 6 HOURS AS NEEDED  0  . letrozole (FEMARA) 2.5 MG tablet TK 1 T PO  QD  3  . ondansetron (  ZOFRAN) 8 MG tablet Take 1 tablet (8 mg total) by mouth 2 (two) times daily. Start the day after chemo for 2 days. Then take as needed for nausea or vomiting. (Patient not taking: Reported on 09/10/2014) 30 tablet 1  . prochlorperazine (COMPAZINE) 10 MG tablet Take 1 tablet (10 mg  total) by mouth every 6 (six) hours as needed (Nausea or vomiting). (Patient not taking: Reported on 09/10/2014) 30 tablet 1  . venlafaxine XR (EFFEXOR-XR) 75 MG 24 hr capsule Take 1 capsule (75 mg total) by mouth daily with breakfast. 30 capsule 3   No current facility-administered medications for this visit.    PHYSICAL EXAMINATION: ECOG PERFORMANCE STATUS: 1 - Symptomatic but completely ambulatory  Filed Vitals:   09/10/14 1315  BP: 153/60  Pulse: 78  Temp: 98 F (36.7 C)  Resp: 18   Filed Weights   09/10/14 1315  Weight: 207 lb (93.895 kg)    GENERAL:alert, no distress and comfortable SKIN: skin color, texture, turgor are normal, no rashes or significant lesions EYES: normal, Conjunctiva are pink and non-injected, sclera clear OROPHARYNX:no exudate, no erythema and lips, buccal mucosa, and tongue normal  NECK: supple, thyroid normal size, non-tender, without nodularity LYMPH:  no palpable lymphadenopathy in the cervical, axillary or inguinal LUNGS: clear to auscultation and percussion with normal breathing effort HEART: regular rate & rhythm and no murmurs and no lower extremity edema ABDOMEN:abdomen soft, non-tender and normal bowel sounds Musculoskeletal:no cyanosis of digits and no clubbing  NEURO: alert & oriented x 3 with fluent speech, no focal motor/sensory deficits  LABORATORY DATA:  I have reviewed the data as listed   Chemistry      Component Value Date/Time   NA 142 09/10/2014 1257   NA 141 08/12/2013 0923   K 4.2 09/10/2014 1257   K 4.2 08/12/2013 0923   CL 106 08/12/2013 0923   CL 107 04/05/2012 1253   CO2 24 09/10/2014 1257   CO2 27 08/12/2013 0923   BUN 17.8 09/10/2014 1257   BUN 20 08/12/2013 0923   CREATININE 0.8 09/10/2014 1257   CREATININE 0.6 08/12/2013 0923      Component Value Date/Time   CALCIUM 9.3 09/10/2014 1257   CALCIUM 9.8 08/12/2013 0923   ALKPHOS 108 09/10/2014 1257   ALKPHOS 106 08/12/2013 0923   AST 76* 09/10/2014 1257    AST 42* 08/12/2013 0923   ALT 77* 09/10/2014 1257   ALT 52* 08/12/2013 0923   BILITOT 0.61 09/10/2014 1257   BILITOT 0.7 08/12/2013 0923       Lab Results  Component Value Date   WBC 7.7 09/10/2014   HGB 14.7 09/10/2014   HCT 43.7 09/10/2014   MCV 96.3 09/10/2014   PLT 153 09/10/2014   NEUTROABS 4.7 09/10/2014   ASSESSMENT & PLAN:  Follicular 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 Retroperitoneal mass biopsy left side 02/01/3233: Follicular lymphoma low-grade grade 1-2, flow cytometry positive for CD10, 20, 3, 10, 5, BCL-2, BCl-6, CD21, Ki-67 less than 10%, stage IB (8X 4 cm mass)  Treatment plan:systemic chemotherapy with bendamustine and Rituxan 6 cycles followed by Rituxan maintenance every 2 months for 2 years  Current treatment: Cycle 2 bendamustine and Rituxan Chemotherapy toxicities: 1. First infusion reaction to Rituxan 2. Fatigue 3. Anxiety due to dexamethasone: I discontinued oral dexamethasone for the next chemotherapy. 4. Insomnia: benadryl QHS Severe anxiety: Previously prescribed Ativan. instructed to take 2 tablets of Ativan at bedtime for sleep. We discussed venlafaxine and as and SNRI this  may improve her symptoms better than ativan alone. She will begin 30m daily. She understands this will take 2-3 weeks to build up into her system.    Return to clinic in 4 weeks for follow-up prior to cycle 3 of treatment   Breast cancer, right breast  Letrozole is on hold while the patient is on chemotherapy for follicular lymphoma    No orders of the defined types were placed in this encounter.   The patient has a good understanding of the overall plan. she agrees with it. she will call with any problems that may develop before the next visit here.   HLaurie Panda NP

## 2014-09-10 NOTE — Assessment & Plan Note (Signed)
  Letrozole is on hold while the patient is on chemotherapy for follicular lymphoma

## 2014-09-11 ENCOUNTER — Ambulatory Visit (HOSPITAL_BASED_OUTPATIENT_CLINIC_OR_DEPARTMENT_OTHER): Payer: Medicare Other

## 2014-09-11 VITALS — BP 133/54 | HR 70 | Temp 97.0°F | Resp 18

## 2014-09-11 DIAGNOSIS — C8203 Follicular lymphoma grade I, intra-abdominal lymph nodes: Secondary | ICD-10-CM

## 2014-09-11 DIAGNOSIS — Z5112 Encounter for antineoplastic immunotherapy: Secondary | ICD-10-CM | POA: Diagnosis not present

## 2014-09-11 DIAGNOSIS — Z5111 Encounter for antineoplastic chemotherapy: Secondary | ICD-10-CM | POA: Diagnosis not present

## 2014-09-11 MED ORDER — HEPARIN SOD (PORK) LOCK FLUSH 100 UNIT/ML IV SOLN
500.0000 [IU] | Freq: Once | INTRAVENOUS | Status: AC | PRN
  Administered 2014-09-11: 500 [IU]
  Filled 2014-09-11: qty 5

## 2014-09-11 MED ORDER — SODIUM CHLORIDE 0.9 % IV SOLN
75.0000 mg/m2 | Freq: Once | INTRAVENOUS | Status: AC
  Administered 2014-09-11: 150 mg via INTRAVENOUS
  Filled 2014-09-11: qty 6

## 2014-09-11 MED ORDER — SODIUM CHLORIDE 0.9 % IJ SOLN
10.0000 mL | INTRAMUSCULAR | Status: DC | PRN
  Administered 2014-09-11: 10 mL
  Filled 2014-09-11: qty 10

## 2014-09-11 MED ORDER — DIPHENHYDRAMINE HCL 25 MG PO CAPS
50.0000 mg | ORAL_CAPSULE | Freq: Once | ORAL | Status: AC
  Administered 2014-09-11: 50 mg via ORAL

## 2014-09-11 MED ORDER — SODIUM CHLORIDE 0.9 % IV SOLN
375.0000 mg/m2 | Freq: Once | INTRAVENOUS | Status: AC
  Administered 2014-09-11: 800 mg via INTRAVENOUS
  Filled 2014-09-11: qty 80

## 2014-09-11 MED ORDER — ACETAMINOPHEN 325 MG PO TABS
650.0000 mg | ORAL_TABLET | Freq: Once | ORAL | Status: AC
Start: 2014-09-11 — End: 2014-09-11
  Administered 2014-09-11: 650 mg via ORAL

## 2014-09-11 MED ORDER — DIPHENHYDRAMINE HCL 25 MG PO CAPS
ORAL_CAPSULE | ORAL | Status: AC
  Filled 2014-09-11: qty 2

## 2014-09-11 MED ORDER — SODIUM CHLORIDE 0.9 % IV SOLN
Freq: Once | INTRAVENOUS | Status: AC
  Administered 2014-09-11: 16:00:00 via INTRAVENOUS
  Filled 2014-09-11: qty 4

## 2014-09-11 MED ORDER — SODIUM CHLORIDE 0.9 % IV SOLN
Freq: Once | INTRAVENOUS | Status: AC
  Administered 2014-09-11: 11:00:00 via INTRAVENOUS

## 2014-09-11 MED ORDER — ACETAMINOPHEN 325 MG PO TABS
ORAL_TABLET | ORAL | Status: AC
  Filled 2014-09-11: qty 2

## 2014-09-11 NOTE — Progress Notes (Signed)
Tolerated Rituxan well today. No reactions.

## 2014-09-11 NOTE — Patient Instructions (Signed)
[facility] Discharge Instructions for Patients Receiving Chemotherapy  Today you received the following chemotherapy agents: Bendamustine. To help prevent nausea and vomiting after your treatment, we encourage you to take your nausea medication: Compazine 10 mg every 6 hours as needed; Lorazepam 0.5 mg every 8 hours as needed.   If you develop nausea and vomiting that is not controlled by your nausea medication, call the clinic.   BELOW ARE SYMPTOMS THAT SHOULD BE REPORTED IMMEDIATELY:  *FEVER GREATER THAN 100.5 F  *CHILLS WITH OR WITHOUT FEVER  NAUSEA AND VOMITING THAT IS NOT CONTROLLED WITH YOUR NAUSEA MEDICATION  *UNUSUAL SHORTNESS OF BREATH  *UNUSUAL BRUISING OR BLEEDING  TENDERNESS IN MOUTH AND THROAT WITH OR WITHOUT PRESENCE OF ULCERS  *URINARY PROBLEMS  *BOWEL PROBLEMS  UNUSUAL RASH Items with * indicate a potential emergency and should be followed up as soon as possible.  Feel free to call the clinic you have any questions or concerns. The clinic phone number is [phone].  Please show the [facility] at check-in to the Emergency Department and triage nurse.

## 2014-09-12 ENCOUNTER — Ambulatory Visit (HOSPITAL_BASED_OUTPATIENT_CLINIC_OR_DEPARTMENT_OTHER): Payer: Medicare Other

## 2014-09-12 VITALS — BP 146/68 | HR 99 | Temp 97.9°F | Resp 18

## 2014-09-12 DIAGNOSIS — C8203 Follicular lymphoma grade I, intra-abdominal lymph nodes: Secondary | ICD-10-CM

## 2014-09-12 DIAGNOSIS — Z5112 Encounter for antineoplastic immunotherapy: Secondary | ICD-10-CM

## 2014-09-12 MED ORDER — SODIUM CHLORIDE 0.9 % IV SOLN
Freq: Once | INTRAVENOUS | Status: AC
  Administered 2014-09-12: 12:00:00 via INTRAVENOUS

## 2014-09-12 MED ORDER — SODIUM CHLORIDE 0.9 % IV SOLN
Freq: Once | INTRAVENOUS | Status: AC
  Administered 2014-09-12: 12:00:00 via INTRAVENOUS
  Filled 2014-09-12: qty 4

## 2014-09-12 MED ORDER — SODIUM CHLORIDE 0.9 % IV SOLN
75.0000 mg/m2 | Freq: Once | INTRAVENOUS | Status: AC
  Administered 2014-09-12: 150 mg via INTRAVENOUS
  Filled 2014-09-12: qty 6

## 2014-09-12 MED ORDER — HEPARIN SOD (PORK) LOCK FLUSH 100 UNIT/ML IV SOLN
500.0000 [IU] | Freq: Once | INTRAVENOUS | Status: AC | PRN
  Administered 2014-09-12: 500 [IU]
  Filled 2014-09-12: qty 5

## 2014-09-12 MED ORDER — SODIUM CHLORIDE 0.9 % IJ SOLN
10.0000 mL | INTRAMUSCULAR | Status: DC | PRN
  Administered 2014-09-12: 10 mL
  Filled 2014-09-12: qty 10

## 2014-09-12 NOTE — Patient Instructions (Signed)
[facility] Discharge Instructions for Patients Receiving Chemotherapy  Today you received the following chemotherapy agents Bendeka.   To help prevent nausea and vomiting after your treatment, we encourage you to take your nausea medication as directed.    If you develop nausea and vomiting that is not controlled by your nausea medication, call the clinic.   BELOW ARE SYMPTOMS THAT SHOULD BE REPORTED IMMEDIATELY:  *FEVER GREATER THAN 100.5 F  *CHILLS WITH OR WITHOUT FEVER  NAUSEA AND VOMITING THAT IS NOT CONTROLLED WITH YOUR NAUSEA MEDICATION  *UNUSUAL SHORTNESS OF BREATH  *UNUSUAL BRUISING OR BLEEDING  TENDERNESS IN MOUTH AND THROAT WITH OR WITHOUT PRESENCE OF ULCERS  *URINARY PROBLEMS  *BOWEL PROBLEMS  UNUSUAL RASH Items with * indicate a potential emergency and should be followed up as soon as possible.  Feel free to call the clinic you have any questions or concerns. The clinic phone number is [phone].  Please show the CHEMO ALERT CARD at check-in to the Emergency Department and triage nurse.   

## 2014-09-16 ENCOUNTER — Encounter: Payer: Self-pay | Admitting: Gastroenterology

## 2014-09-22 ENCOUNTER — Telehealth: Payer: Self-pay | Admitting: *Deleted

## 2014-09-22 NOTE — Telephone Encounter (Signed)
This nurse called patient at 1720 who reports "since she started the Imodium, my bowels haven't moved.  I've eaten bananas, crackers and drinking well."     Asked that she call tomorrow morning with an update to ensure she doesn't need treatment for any dehydration.

## 2014-09-22 NOTE — Telephone Encounter (Signed)
[place] patient after voicemail heard about diarrhea.  "Yesterday my bowels started running like water.  This morning after breakfast they started running again.  Please call to tell me what I can do about it."   "Bowels moved five times yesterday and three times today.  Squirts of yellow liquid stools today.  Last night I messed on myself.  I called at 10:00 pm but the nurse couldn't help me and I do not understand why.  I'm eating peanut butter and crackers."   Received Benda mustine 09-12-2014.  This nurse advised she take imodium with no more than eight pills per day or 24 hours.  Also to drink sports drinks and liquids for the next 12 hours.  To slowly add soft foods like bananas, mashed potatoes etc, until tolerating with no loose stools for 12 hours.  Will notify provider for any further orders.

## 2014-09-23 NOTE — Telephone Encounter (Signed)
VOICE MAIL AT 10:40AM PT. CALLED. SHE HAS NOT HAD ANY MORE DIARRHEA. PT. IS FEELING BETTER JUST A LITTLE TIRED. SHE IS VERY APPRECIATIVE OF THE NURSE'S HELP YESTERDAY.

## 2014-10-09 ENCOUNTER — Other Ambulatory Visit (HOSPITAL_BASED_OUTPATIENT_CLINIC_OR_DEPARTMENT_OTHER): Payer: Medicare Other

## 2014-10-09 ENCOUNTER — Encounter: Payer: Self-pay | Admitting: Hematology and Oncology

## 2014-10-09 ENCOUNTER — Ambulatory Visit (HOSPITAL_BASED_OUTPATIENT_CLINIC_OR_DEPARTMENT_OTHER): Payer: Medicare Other

## 2014-10-09 ENCOUNTER — Ambulatory Visit (HOSPITAL_BASED_OUTPATIENT_CLINIC_OR_DEPARTMENT_OTHER): Payer: Medicare Other | Admitting: Hematology and Oncology

## 2014-10-09 VITALS — BP 143/92 | HR 80 | Temp 98.0°F | Resp 18 | Ht 67.0 in | Wt 204.4 lb

## 2014-10-09 VITALS — BP 147/68 | HR 66 | Temp 97.9°F | Resp 18

## 2014-10-09 DIAGNOSIS — Z5112 Encounter for antineoplastic immunotherapy: Secondary | ICD-10-CM | POA: Diagnosis not present

## 2014-10-09 DIAGNOSIS — Z5111 Encounter for antineoplastic chemotherapy: Secondary | ICD-10-CM

## 2014-10-09 DIAGNOSIS — C8203 Follicular lymphoma grade I, intra-abdominal lymph nodes: Secondary | ICD-10-CM | POA: Diagnosis not present

## 2014-10-09 LAB — COMPREHENSIVE METABOLIC PANEL (CC13)
ALBUMIN: 3.6 g/dL (ref 3.5–5.0)
ALK PHOS: 95 U/L (ref 40–150)
ALT: 61 U/L — ABNORMAL HIGH (ref 0–55)
AST: 56 U/L — AB (ref 5–34)
Anion Gap: 10 mEq/L (ref 3–11)
BILIRUBIN TOTAL: 0.54 mg/dL (ref 0.20–1.20)
BUN: 16 mg/dL (ref 7.0–26.0)
CO2: 26 meq/L (ref 22–29)
Calcium: 9.7 mg/dL (ref 8.4–10.4)
Chloride: 108 mEq/L (ref 98–109)
Creatinine: 0.8 mg/dL (ref 0.6–1.1)
EGFR: 77 mL/min/{1.73_m2} — ABNORMAL LOW (ref >90)
GLUCOSE: 117 mg/dL (ref 70–140)
Potassium: 4.2 mEq/L (ref 3.5–5.1)
SODIUM: 144 meq/L (ref 136–145)
TOTAL PROTEIN: 6.4 g/dL (ref 6.4–8.3)

## 2014-10-09 LAB — CBC WITH DIFFERENTIAL/PLATELET
BASO%: 0.9 % (ref 0.0–2.0)
Basophils Absolute: 0.1 10*3/uL (ref 0.0–0.1)
EOS%: 15.9 % — AB (ref 0.0–7.0)
Eosinophils Absolute: 1 10*3/uL — ABNORMAL HIGH (ref 0.0–0.5)
HCT: 42.6 % (ref 34.8–46.6)
HEMOGLOBIN: 14.3 g/dL (ref 11.6–15.9)
LYMPH%: 4.9 % — ABNORMAL LOW (ref 14.0–49.7)
MCH: 32.4 pg (ref 25.1–34.0)
MCHC: 33.5 g/dL (ref 31.5–36.0)
MCV: 96.9 fL (ref 79.5–101.0)
MONO#: 0.8 10*3/uL (ref 0.1–0.9)
MONO%: 12.9 % (ref 0.0–14.0)
NEUT%: 65.4 % (ref 38.4–76.8)
NEUTROS ABS: 4 10*3/uL (ref 1.5–6.5)
Platelets: 139 10*3/uL — ABNORMAL LOW (ref 145–400)
RBC: 4.4 10*6/uL (ref 3.70–5.45)
RDW: 13.2 % (ref 11.2–14.5)
WBC: 6.2 10*3/uL (ref 3.9–10.3)
lymph#: 0.3 10*3/uL — ABNORMAL LOW (ref 0.9–3.3)

## 2014-10-09 MED ORDER — HEPARIN SOD (PORK) LOCK FLUSH 100 UNIT/ML IV SOLN
500.0000 [IU] | Freq: Once | INTRAVENOUS | Status: AC | PRN
  Administered 2014-10-09: 500 [IU]
  Filled 2014-10-09: qty 5

## 2014-10-09 MED ORDER — DIPHENHYDRAMINE HCL 25 MG PO CAPS
ORAL_CAPSULE | ORAL | Status: AC
  Filled 2014-10-09: qty 2

## 2014-10-09 MED ORDER — SODIUM CHLORIDE 0.9 % IV SOLN
Freq: Once | INTRAVENOUS | Status: AC
  Administered 2014-10-09: 13:00:00 via INTRAVENOUS
  Filled 2014-10-09: qty 4

## 2014-10-09 MED ORDER — DIPHENHYDRAMINE HCL 25 MG PO CAPS
50.0000 mg | ORAL_CAPSULE | Freq: Once | ORAL | Status: AC
  Administered 2014-10-09: 50 mg via ORAL

## 2014-10-09 MED ORDER — SODIUM CHLORIDE 0.9 % IV SOLN
Freq: Once | INTRAVENOUS | Status: AC
  Administered 2014-10-09: 09:00:00 via INTRAVENOUS

## 2014-10-09 MED ORDER — ACETAMINOPHEN 325 MG PO TABS
650.0000 mg | ORAL_TABLET | Freq: Once | ORAL | Status: AC
  Administered 2014-10-09: 650 mg via ORAL

## 2014-10-09 MED ORDER — SODIUM CHLORIDE 0.9 % IV SOLN
375.0000 mg/m2 | Freq: Once | INTRAVENOUS | Status: AC
  Administered 2014-10-09: 800 mg via INTRAVENOUS
  Filled 2014-10-09: qty 80

## 2014-10-09 MED ORDER — SODIUM CHLORIDE 0.9 % IV SOLN
75.0000 mg/m2 | Freq: Once | INTRAVENOUS | Status: AC
  Administered 2014-10-09: 150 mg via INTRAVENOUS
  Filled 2014-10-09: qty 6

## 2014-10-09 MED ORDER — SODIUM CHLORIDE 0.9 % IJ SOLN
10.0000 mL | INTRAMUSCULAR | Status: DC | PRN
  Administered 2014-10-09: 10 mL
  Filled 2014-10-09: qty 10

## 2014-10-09 MED ORDER — ACETAMINOPHEN 325 MG PO TABS
ORAL_TABLET | ORAL | Status: AC
  Filled 2014-10-09: qty 2

## 2014-10-09 NOTE — Patient Instructions (Signed)
[facility] Discharge Instructions for Patients Receiving Chemotherapy  Today you received the following chemotherapy agents: Rituxan and Bendeka  To help prevent nausea and vomiting after your treatment, we encourage you to take your nausea medication as directed.    If you develop nausea and vomiting that is not controlled by your nausea medication, call the clinic.   BELOW ARE SYMPTOMS THAT SHOULD BE REPORTED IMMEDIATELY:  *FEVER GREATER THAN 100.5 F  *CHILLS WITH OR WITHOUT FEVER  NAUSEA AND VOMITING THAT IS NOT CONTROLLED WITH YOUR NAUSEA MEDICATION  *UNUSUAL SHORTNESS OF BREATH  *UNUSUAL BRUISING OR BLEEDING  TENDERNESS IN MOUTH AND THROAT WITH OR WITHOUT PRESENCE OF ULCERS  *URINARY PROBLEMS  *BOWEL PROBLEMS  UNUSUAL RASH Items with * indicate a potential emergency and should be followed up as soon as possible.  Feel free to call the clinic you have any questions or concerns. The clinic phone number is [phone].  Please show the CHEMO ALERT CARD at check-in to the Emergency Department and triage nurse.   

## 2014-10-09 NOTE — Assessment & Plan Note (Addendum)
Retroperitoneal mass biopsy left side 35/00/9381: Follicular lymphoma low-grade grade 1-2, flow cytometry positive for CD10, 20, 3, 10, 5, BCL-2, BCl-6, CD21, Ki-67 less than 10%, stage IB (8X 4 cm mass)  Treatment plan:systemic chemotherapy with bendamustine and Rituxan 6 cycles followed by Rituxan maintenance every 2 months for 2 years  Current treatment: Cycle 3 bendamustine and Rituxan Chemotherapy toxicities: 1. First infusion reaction to Rituxan 2. Fatigue 3. Anxiety due to dexamethasone: I discontinued oral dexamethasone for the next chemotherapy. 4. Insomnia: benadryl QHS  Severe anxiety: Currently on venlafaxine as well as Ativan at bedtime for sleep Plan: We will obtain CT scans for interim assessment of response to BR chemotherapy. Return to clinic in 4 weeks for cycle 4 with scans to be done prior to that

## 2014-10-09 NOTE — Progress Notes (Signed)
Patient Care Team: Marletta Lor, MD as PCP - General  DIAGNOSIS: Breast cancer, right breast   Staging form: Breast, AJCC 7th Edition (    Clinical: Tis (DCIS), NX, cM0 - Unsigned   SUMMARY OF ONCOLOGIC HISTORY:   Breast cancer, right breast   04/14/2011 Surgery Right mastectomy: 2 foci of invasive ductal carcinoma 0.2 cm, grade 1 with background extensive DCIS, 0/5 lymph nodes negative,focus 1: ER 94%, PR 100%, Ki-67 5%, HER-2 negative: Focus to ER 100%BR 6% Ki-67 29% HER-2 negative   05/02/2011 -  Anti-estrogen oral therapy letrozole 2.5 mg daily    Follicular 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   07/24/2014 Initial Diagnosis Retroperitoneal mass biopsy left side: Follicular lymphoma low-grade grade 1-2, flow cytometry positive for CD10, 20, 3, 10, 5, BCL-2, BCl-6, CD21, Ki-67 less than 10%   08/13/2014 -  Chemotherapy Bendamustine and Rituxan day 1, 2 every 4 weeks 6 cycles followed by Rituxan maintenance every 2 months 2 years    CHIEF COMPLIANT: Diarrhea after last chemotherapy, today is cycle 3 Bendamustine Rituxan  INTERVAL HISTORY: Selena [name] is a 79 year old with above-mentioned history of retroperitoneal mass which came back as follicular lymphoma. Because she was symptomatic she was started on chemotherapy. She is tolerating it fairly well. She had a few episodes of diarrhea after the last cycle. She is also having difficulty with sleeping. She was started on venlafaxine for depression. She appears to be tolerating them generally well.  REVIEW OF SYSTEMS:   Constitutional: Denies fevers, chills or abnormal weight loss Eyes: Denies blurriness of vision Ears, nose, mouth, throat, and face: Denies mucositis or sore throat Respiratory: Denies cough, dyspnea or wheezes Cardiovascular: Denies palpitation, chest discomfort or lower extremity swelling Gastrointestinal:  Denies nausea, heartburn or change in bowel habits Skin: Denies abnormal skin  rashes Lymphatics: Denies new lymphadenopathy or easy bruising Neurological:Denies numbness, tingling or new weaknesses Behavioral/Psych: Difficulty with sleep   All other systems were reviewed with the patient and are negative.  I have reviewed the past medical history, past surgical history, social history and family history with the patient and they are unchanged from previous note.  ALLERGIES:  is allergic to codeine and codeine phosphate.  MEDICATIONS:  Current Outpatient Prescriptions  Medication Sig Dispense Refill  . acyclovir (ZOVIRAX) 400 MG tablet Take 1 tablet (400 mg total) by mouth daily. 30 tablet 3  . cholecalciferol (VITAMIN D) 1000 UNITS tablet Take 1,000 Units by mouth daily.    . diclofenac (VOLTAREN) 75 MG EC tablet TAKE 1 TABLET BY MOUTH TWICE DAILY (Patient taking differently: Take 75 mg by mouth daily. ) 180 tablet 3  . letrozole (FEMARA) 2.5 MG tablet TK 1 T PO  QD  3  . lidocaine-prilocaine (EMLA) cream Apply to affected area once (Patient taking differently: Apply 1 application topically once. For chemo port) 30 g 3  . prochlorperazine (COMPAZINE) 10 MG tablet Take 1 tablet (10 mg total) by mouth every 6 (six) hours as needed (Nausea or vomiting). 30 tablet 1  . UNABLE TO FIND Rx: L8000- Post Surgical Bras (Quantity: 6) I7867- Silicone Breast Prosthesis (Quantity: 1) Dx: 174.9; Right Mastectomy 1 each 0  . venlafaxine XR (EFFEXOR-XR) 75 MG 24 hr capsule Take 1 capsule (75 mg total) by mouth daily with breakfast. 30 capsule 3  . HYDROcodone-acetaminophen (NORCO) 10-325 MG per tablet TAKE 1 TABLET BY MOUTH EV ERY 6 HOURS AS NEEDED  0  . LORazepam (ATIVAN) 0.5 MG tablet Take 1  tablet (0.5 mg total) by mouth every 8 (eight) hours as needed (Nausea or vomiting). (Patient not taking: Reported on 10/09/2014) 60 tablet 0  . ondansetron (ZOFRAN) 8 MG tablet Take 1 tablet (8 mg total) by mouth 2 (two) times daily. Start the day after chemo for 2 days. Then take as needed for  nausea or vomiting. (Patient not taking: Reported on 09/10/2014) 30 tablet 1   No current facility-administered medications for this visit.    PHYSICAL EXAMINATION: ECOG PERFORMANCE STATUS: 1 - Symptomatic but completely ambulatory  Filed Vitals:   10/09/14 0831  BP: 143/92  Pulse: 80  Temp: 98 F (36.7 C)  Resp: 18   Filed Weights   10/09/14 0831  Weight: 204 lb 6.4 oz (92.715 kg)    GENERAL:alert, no distress and comfortable SKIN: skin color, texture, turgor are normal, no rashes or significant lesions EYES: normal, Conjunctiva are pink and non-injected, sclera clear OROPHARYNX:no exudate, no erythema and lips, buccal mucosa, and tongue normal  NECK: supple, thyroid normal size, non-tender, without nodularity LYMPH:  no palpable lymphadenopathy in the cervical, axillary or inguinal LUNGS: clear to auscultation and percussion with normal breathing effort HEART: regular rate & rhythm and no murmurs and no lower extremity edema ABDOMEN:abdomen soft, non-tender and normal bowel sounds Musculoskeletal:no cyanosis of digits and no clubbing  NEURO: alert & oriented x 3 with fluent speech, no focal motor/sensory deficits  LABORATORY DATA:  I have reviewed the data as listed   Chemistry      Component Value Date/Time   NA 144 10/09/2014 0752   NA 141 08/12/2013 0923   K 4.2 10/09/2014 0752   K 4.2 08/12/2013 0923   CL 106 08/12/2013 0923   CL 107 04/05/2012 1253   CO2 26 10/09/2014 0752   CO2 27 08/12/2013 0923   BUN 16.0 10/09/2014 0752   BUN 20 08/12/2013 0923   CREATININE 0.8 10/09/2014 0752   CREATININE 0.6 08/12/2013 0923      Component Value Date/Time   CALCIUM 9.7 10/09/2014 0752   CALCIUM 9.8 08/12/2013 0923   ALKPHOS 95 10/09/2014 0752   ALKPHOS 106 08/12/2013 0923   AST 56* 10/09/2014 0752   AST 42* 08/12/2013 0923   ALT 61* 10/09/2014 0752   ALT 52* 08/12/2013 0923   BILITOT 0.54 10/09/2014 0752   BILITOT 0.7 08/12/2013 0923       Lab Results   Component Value Date   WBC 6.2 10/09/2014   HGB 14.3 10/09/2014   HCT 42.6 10/09/2014   MCV 96.9 10/09/2014   PLT 139* 10/09/2014   NEUTROABS 4.0 10/09/2014    ASSESSMENT & PLAN:  Follicular 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 Retroperitoneal mass biopsy left side 55/73/2202: Follicular lymphoma low-grade grade 1-2, flow cytometry positive for CD10, 20, 3, 10, 5, BCL-2, BCl-6, CD21, Ki-67 less than 10%, stage IB (8X 4 cm mass)  Treatment plan:systemic chemotherapy with bendamustine and Rituxan 6 cycles followed by Rituxan maintenance every 2 months for 2 years  Current treatment: Cycle 3 bendamustine and Rituxan Chemotherapy toxicities: 1. First infusion reaction to Rituxan 2. Fatigue 3. Anxiety due to dexamethasone: I discontinued oral dexamethasone for the next chemotherapy. 4. Insomnia: benadryl QHS  Severe anxiety: Currently on venlafaxine as well as Ativan at bedtime for sleep Plan: We will obtain CT scans for interim assessment of response to BR chemotherapy. Return to clinic in 4 weeks for cycle 4 with scans to be done prior to that   No orders of the  defined types were placed in this encounter.   The patient has a good understanding of the overall plan. she agrees with it. she will call with any problems that may develop before the next visit here.   Rulon Eisenmenger, MD

## 2014-10-10 ENCOUNTER — Ambulatory Visit (HOSPITAL_BASED_OUTPATIENT_CLINIC_OR_DEPARTMENT_OTHER): Payer: Medicare Other

## 2014-10-10 DIAGNOSIS — C8203 Follicular lymphoma grade I, intra-abdominal lymph nodes: Secondary | ICD-10-CM

## 2014-10-10 DIAGNOSIS — Z5111 Encounter for antineoplastic chemotherapy: Secondary | ICD-10-CM | POA: Diagnosis not present

## 2014-10-10 MED ORDER — SODIUM CHLORIDE 0.9 % IJ SOLN
10.0000 mL | INTRAMUSCULAR | Status: DC | PRN
  Administered 2014-10-10: 10 mL
  Filled 2014-10-10: qty 10

## 2014-10-10 MED ORDER — DEXAMETHASONE SODIUM PHOSPHATE 100 MG/10ML IJ SOLN
Freq: Once | INTRAMUSCULAR | Status: AC
  Administered 2014-10-10: 11:00:00 via INTRAVENOUS
  Filled 2014-10-10: qty 4

## 2014-10-10 MED ORDER — HEPARIN SOD (PORK) LOCK FLUSH 100 UNIT/ML IV SOLN
500.0000 [IU] | Freq: Once | INTRAVENOUS | Status: AC | PRN
  Administered 2014-10-10: 500 [IU]
  Filled 2014-10-10: qty 5

## 2014-10-10 MED ORDER — SODIUM CHLORIDE 0.9 % IV SOLN
75.0000 mg/m2 | Freq: Once | INTRAVENOUS | Status: AC
  Administered 2014-10-10: 150 mg via INTRAVENOUS
  Filled 2014-10-10: qty 6

## 2014-10-10 MED ORDER — SODIUM CHLORIDE 0.9 % IV SOLN
Freq: Once | INTRAVENOUS | Status: AC
  Administered 2014-10-10: 11:00:00 via INTRAVENOUS

## 2014-10-10 NOTE — Patient Instructions (Signed)
[facility] Discharge Instructions for Patients Receiving Chemotherapy  Today you received the following chemotherapy agents Bendeka.  To help prevent nausea and vomiting after your treatment, we encourage you to take your nausea medication.   If you develop nausea and vomiting that is not controlled by your nausea medication, call the clinic.   BELOW ARE SYMPTOMS THAT SHOULD BE REPORTED IMMEDIATELY:  *FEVER GREATER THAN 100.5 F  *CHILLS WITH OR WITHOUT FEVER  NAUSEA AND VOMITING THAT IS NOT CONTROLLED WITH YOUR NAUSEA MEDICATION  *UNUSUAL SHORTNESS OF BREATH  *UNUSUAL BRUISING OR BLEEDING  TENDERNESS IN MOUTH AND THROAT WITH OR WITHOUT PRESENCE OF ULCERS  *URINARY PROBLEMS  *BOWEL PROBLEMS  UNUSUAL RASH Items with * indicate a potential emergency and should be followed up as soon as possible.  Feel free to call the clinic you have any questions or concerns. The clinic phone number is [phone].  Please show the CHEMO ALERT CARD at check-in to the Emergency Department and triage nurse.   

## 2014-10-23 ENCOUNTER — Other Ambulatory Visit: Payer: Self-pay | Admitting: *Deleted

## 2014-10-23 DIAGNOSIS — C8203 Follicular lymphoma grade I, intra-abdominal lymph nodes: Secondary | ICD-10-CM

## 2014-10-23 DIAGNOSIS — G4700 Insomnia, unspecified: Secondary | ICD-10-CM

## 2014-10-23 DIAGNOSIS — F419 Anxiety disorder, unspecified: Secondary | ICD-10-CM

## 2014-10-23 MED ORDER — LORAZEPAM 0.5 MG PO TABS: 0.5000 mg | ORAL_TABLET | Freq: Three times a day (TID) | ORAL | Status: DC | PRN

## 2014-11-03 ENCOUNTER — Ambulatory Visit (HOSPITAL_COMMUNITY)
Admission: RE | Admit: 2014-11-03 | Discharge: 2014-11-03 | Disposition: A | Discharge status: Home / Self Care | Payer: Medicare Other | Source: Ambulatory Visit | Attending: Hematology and Oncology | Admitting: Hematology and Oncology

## 2014-11-03 DIAGNOSIS — K760 Fatty (change of) liver, not elsewhere classified: Secondary | ICD-10-CM | POA: Insufficient documentation

## 2014-11-03 DIAGNOSIS — C8203 Follicular lymphoma grade I, intra-abdominal lymph nodes: Secondary | ICD-10-CM | POA: Diagnosis not present

## 2014-11-03 MED ORDER — IOHEXOL 300 MG/ML  SOLN
75.0000 mL | Freq: Once | INTRAMUSCULAR | Status: AC | PRN
  Administered 2014-11-03: 100 mL via INTRAVENOUS

## 2014-11-05 NOTE — Assessment & Plan Note (Signed)
Retroperitoneal mass biopsy left side 06/00/4599: Follicular lymphoma low-grade grade 1-2, flow cytometry positive for CD10, 20, 3, 10, 5, BCL-2, BCl-6, CD21, Ki-67 less than 10%, stage IB (8X 4 cm mass)  Treatment plan:systemic chemotherapy with bendamustine and Rituxan 6 cycles followed by Rituxan maintenance every 2 months for 2 years  Current treatment: Cycle 4 bendamustine and Rituxan Chemotherapy toxicities: 1. First infusion reaction to Rituxan 2. Fatigue 3. Anxiety due to dexamethasone: I discontinued oral dexamethasone for the next chemotherapy. 4. Insomnia: benadryl QHS  Severe anxiety: Currently on venlafaxine as well as Ativan at bedtime for sleep Plan: Ct scans 11/03/14: Left periaortic nodal mass is decreased in size, now measuring 1.6 x 3.2 cm (previously 4.2 x 7.5 cm).

## 2014-11-06 ENCOUNTER — Ambulatory Visit (HOSPITAL_BASED_OUTPATIENT_CLINIC_OR_DEPARTMENT_OTHER): Payer: Medicare Other | Admitting: Hematology and Oncology

## 2014-11-06 ENCOUNTER — Ambulatory Visit (HOSPITAL_BASED_OUTPATIENT_CLINIC_OR_DEPARTMENT_OTHER): Payer: Medicare Other

## 2014-11-06 ENCOUNTER — Telehealth: Payer: Self-pay | Admitting: Hematology and Oncology

## 2014-11-06 ENCOUNTER — Encounter: Payer: Self-pay | Admitting: Hematology and Oncology

## 2014-11-06 ENCOUNTER — Other Ambulatory Visit (HOSPITAL_BASED_OUTPATIENT_CLINIC_OR_DEPARTMENT_OTHER): Payer: Medicare Other

## 2014-11-06 VITALS — BP 149/60 | HR 67 | Temp 97.8°F | Resp 16

## 2014-11-06 VITALS — BP 148/87 | HR 92 | Temp 97.9°F | Resp 18 | Ht 67.0 in | Wt 208.4 lb

## 2014-11-06 DIAGNOSIS — Z5111 Encounter for antineoplastic chemotherapy: Secondary | ICD-10-CM

## 2014-11-06 DIAGNOSIS — Z5112 Encounter for antineoplastic immunotherapy: Secondary | ICD-10-CM | POA: Diagnosis not present

## 2014-11-06 DIAGNOSIS — C8203 Follicular lymphoma grade I, intra-abdominal lymph nodes: Secondary | ICD-10-CM

## 2014-11-06 DIAGNOSIS — D0511 Intraductal carcinoma in situ of right breast: Secondary | ICD-10-CM | POA: Diagnosis not present

## 2014-11-06 DIAGNOSIS — F418 Other specified anxiety disorders: Secondary | ICD-10-CM | POA: Diagnosis not present

## 2014-11-06 DIAGNOSIS — C50411 Malignant neoplasm of upper-outer quadrant of right female breast: Secondary | ICD-10-CM

## 2014-11-06 LAB — CBC WITH DIFFERENTIAL/PLATELET
BASO%: 1.5 % (ref 0.0–2.0)
Basophils Absolute: 0.1 10*3/uL (ref 0.0–0.1)
EOS ABS: 0.5 10*3/uL (ref 0.0–0.5)
EOS%: 10.9 % — ABNORMAL HIGH (ref 0.0–7.0)
HEMATOCRIT: 42.6 % (ref 34.8–46.6)
HGB: 14.3 g/dL (ref 11.6–15.9)
LYMPH%: 7.2 % — AB (ref 14.0–49.7)
MCH: 32.5 pg (ref 25.1–34.0)
MCHC: 33.5 g/dL (ref 31.5–36.0)
MCV: 96.8 fL (ref 79.5–101.0)
MONO#: 0.8 10*3/uL (ref 0.1–0.9)
MONO%: 16.2 % — AB (ref 0.0–14.0)
NEUT%: 64.2 % (ref 38.4–76.8)
NEUTROS ABS: 3.2 10*3/uL (ref 1.5–6.5)
PLATELETS: 138 10*3/uL — AB (ref 145–400)
RBC: 4.39 10*6/uL (ref 3.70–5.45)
RDW: 13.3 % (ref 11.2–14.5)
WBC: 5 10*3/uL (ref 3.9–10.3)
lymph#: 0.4 10*3/uL — ABNORMAL LOW (ref 0.9–3.3)

## 2014-11-06 LAB — COMPREHENSIVE METABOLIC PANEL (CC13)
ALBUMIN: 3.5 g/dL (ref 3.5–5.0)
ALK PHOS: 94 U/L (ref 40–150)
ALT: 52 U/L (ref 0–55)
ANION GAP: 7 meq/L (ref 3–11)
AST: 42 U/L — ABNORMAL HIGH (ref 5–34)
BILIRUBIN TOTAL: 0.49 mg/dL (ref 0.20–1.20)
BUN: 17.6 mg/dL (ref 7.0–26.0)
CALCIUM: 9.4 mg/dL (ref 8.4–10.4)
CO2: 25 meq/L (ref 22–29)
CREATININE: 0.7 mg/dL (ref 0.6–1.1)
Chloride: 110 mEq/L — ABNORMAL HIGH (ref 98–109)
EGFR: 84 mL/min/{1.73_m2} — ABNORMAL LOW (ref >90)
Glucose: 122 mg/dl (ref 70–140)
Potassium: 4.2 mEq/L (ref 3.5–5.1)
Sodium: 142 mEq/L (ref 136–145)
TOTAL PROTEIN: 6 g/dL — AB (ref 6.4–8.3)

## 2014-11-06 MED ORDER — SODIUM CHLORIDE 0.9 % IV SOLN
75.0000 mg/m2 | Freq: Once | INTRAVENOUS | Status: AC
  Administered 2014-11-06: 150 mg via INTRAVENOUS
  Filled 2014-11-06: qty 6

## 2014-11-06 MED ORDER — SODIUM CHLORIDE 0.9 % IV SOLN
Freq: Once | INTRAVENOUS | Status: AC
  Administered 2014-11-06: 09:00:00 via INTRAVENOUS

## 2014-11-06 MED ORDER — DIPHENHYDRAMINE HCL 25 MG PO CAPS
50.0000 mg | ORAL_CAPSULE | Freq: Once | ORAL | Status: AC
  Administered 2014-11-06: 50 mg via ORAL

## 2014-11-06 MED ORDER — SODIUM CHLORIDE 0.9 % IV SOLN
Freq: Once | INTRAVENOUS | Status: AC
  Administered 2014-11-06: 13:00:00 via INTRAVENOUS
  Filled 2014-11-06: qty 4

## 2014-11-06 MED ORDER — SODIUM CHLORIDE 0.9 % IV SOLN
375.0000 mg/m2 | Freq: Once | INTRAVENOUS | Status: AC
  Administered 2014-11-06: 800 mg via INTRAVENOUS
  Filled 2014-11-06: qty 80

## 2014-11-06 MED ORDER — HEPARIN SOD (PORK) LOCK FLUSH 100 UNIT/ML IV SOLN
500.0000 [IU] | Freq: Once | INTRAVENOUS | Status: AC | PRN
  Administered 2014-11-06: 500 [IU]
  Filled 2014-11-06: qty 5

## 2014-11-06 MED ORDER — ACETAMINOPHEN 325 MG PO TABS
ORAL_TABLET | ORAL | Status: AC
  Filled 2014-11-06: qty 2

## 2014-11-06 MED ORDER — ACETAMINOPHEN 325 MG PO TABS
650.0000 mg | ORAL_TABLET | Freq: Once | ORAL | Status: AC
  Administered 2014-11-06: 650 mg via ORAL

## 2014-11-06 MED ORDER — DIPHENHYDRAMINE HCL 25 MG PO CAPS
ORAL_CAPSULE | ORAL | Status: AC
  Filled 2014-11-06: qty 2

## 2014-11-06 MED ORDER — SODIUM CHLORIDE 0.9 % IJ SOLN
10.0000 mL | INTRAMUSCULAR | Status: DC | PRN
  Administered 2014-11-06: 10 mL
  Filled 2014-11-06: qty 10

## 2014-11-06 MED ORDER — VENLAFAXINE HCL ER 75 MG PO CP24: 75.0000 mg | ORAL_CAPSULE | Freq: Every day | ORAL | Status: DC

## 2014-11-06 NOTE — Progress Notes (Signed)
Patient Care Team: Marletta Lor, MD as PCP - General  DIAGNOSIS: Breast cancer, right breast [facility])   Staging form: Breast, AJCC 7th Edition     Clinical: Tis (DCIS), NX, cM0 - Unsigned   SUMMARY OF ONCOLOGIC HISTORY:   Breast cancer of upper-outer quadrant of right female breast ([place])   04/14/2011 Surgery Right mastectomy: 2 foci of invasive ductal carcinoma 0.2 cm, grade 1 with background extensive DCIS, 0/5 lymph nodes negative,focus 1: ER 94%, PR 100%, Ki-67 5%, HER-2 negative: Focus to ER 100%BR 6% Ki-67 29% HER-2 negative   05/02/2011 -  Anti-estrogen oral therapy letrozole 2.5 mg daily    Follicular 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 ([name])   07/24/2014 Initial Diagnosis Retroperitoneal mass biopsy left side: Follicular lymphoma low-grade grade 1-2, flow cytometry positive for CD10, 20, 3, 10, 5, BCL-2, BCl-6, CD21, Ki-67 less than 10%   08/13/2014 -  Chemotherapy Bendamustine and Rituxan day 1, 2 every 4 weeks 6 cycles followed by Rituxan maintenance every 2 months 2 years   11/03/2014 Imaging midpoint of chemotherapy: Moderate improvement in the periaortic lymphadenopathy without complete resolution    CHIEF COMPLIANT: cycle 4 bendamustine Rituxan  INTERVAL HISTORY: Selena [name] is a 79 year old with above-mentioned history of grade 1 follicular lymphoma presented as a retroperitoneal mass causing symptoms of back pain. She finished 3 cycles of bendamustine and Rituxan and underwent a CT of the abdomen and is here today to discuss the results. She is also here to start cycle 4 of chemotherapy. Overall she is tolerating chemotherapy extremely well. Denies any fevers or chills. Denies any nausea or vomiting. She is currently off letrozole until chemotherapy is complete. She complains of profound anxiety especially since she is coming today to review the scans.  REVIEW OF SYSTEMS:   Constitutional: Denies fevers, chills or abnormal weight loss Eyes: Denies  blurriness of vision Ears, nose, mouth, throat, and face: Denies mucositis or sore throat Respiratory: Denies cough, dyspnea or wheezes Cardiovascular: Denies palpitation, chest discomfort or lower extremity swelling Gastrointestinal:  Abdominal pain has resolved. Skin: Denies abnormal skin rashes Lymphatics: Denies new lymphadenopathy or easy bruising Neurological:Denies numbness, tingling or new weaknesses Behavioral/Psych: Mood is stable, no new changes  Breast:  denies any pain or lumps or nodules in either breasts All other systems were reviewed with the patient and are negative.  I have reviewed the past medical history, past surgical history, social history and family history with the patient and they are unchanged from previous note.  ALLERGIES:  is allergic to codeine and codeine phosphate.  MEDICATIONS:  Current Outpatient Prescriptions  Medication Sig Dispense Refill  . acyclovir (ZOVIRAX) 400 MG tablet Take 1 tablet (400 mg total) by mouth daily. 30 tablet 3  . cholecalciferol (VITAMIN D) 1000 UNITS tablet Take 1,000 Units by mouth daily.    . diclofenac (VOLTAREN) 75 MG EC tablet TAKE 1 TABLET BY MOUTH TWICE DAILY (Patient taking differently: Take 75 mg by mouth daily. ) 180 tablet 3  . HYDROcodone-acetaminophen (NORCO) 10-325 MG per tablet TAKE 1 TABLET BY MOUTH EV ERY 6 HOURS AS NEEDED  0  . letrozole (FEMARA) 2.5 MG tablet TK 1 T PO  QD  3  . lidocaine-prilocaine (EMLA) cream Apply to affected area once (Patient taking differently: Apply 1 application topically once. For chemo port) 30 g 3  . LORazepam (ATIVAN) 0.5 MG tablet Take 1 tablet (0.5 mg total) by mouth every 8 (eight) hours as needed (Nausea or vomiting). Marvin  tablet 0  . ondansetron (ZOFRAN) 8 MG tablet Take 1 tablet (8 mg total) by mouth 2 (two) times daily. Start the day after chemo for 2 days. Then take as needed for nausea or vomiting. (Patient not taking: Reported on 09/10/2014) 30 tablet 1  . prochlorperazine  (COMPAZINE) 10 MG tablet Take 1 tablet (10 mg total) by mouth every 6 (six) hours as needed (Nausea or vomiting). 30 tablet 1  . UNABLE TO FIND Rx: L8000- Post Surgical Bras (Quantity: 6) L8030- Silicone Breast Prosthesis (Quantity: 1) Dx: 174.9; Right Mastectomy 1 each 0  . venlafaxine XR (EFFEXOR-XR) 75 MG 24 hr capsule Take 1 capsule (75 mg total) by mouth daily with breakfast. 30 capsule 6   No current facility-administered medications for this visit.    PHYSICAL EXAMINATION: ECOG PERFORMANCE STATUS: 1 - Symptomatic but completely ambulatory  Filed Vitals:   11/06/14 0830  BP: 148/87  Pulse: 92  Temp: 97.9 F (36.6 C)  Resp: 18   Filed Weights   11/06/14 0830  Weight: 208 lb 6.4 oz (94.53 kg)    GENERAL:alert, no distress and comfortable SKIN: skin color, texture, turgor are normal, no rashes or significant lesions EYES: normal, Conjunctiva are pink and non-injected, sclera clear OROPHARYNX:no exudate, no erythema and lips, buccal mucosa, and tongue normal  NECK: supple, thyroid normal size, non-tender, without nodularity LYMPH:  no palpable lymphadenopathy in the cervical, axillary or inguinal LUNGS: clear to auscultation and percussion with normal breathing effort HEART: regular rate & rhythm and no murmurs and no lower extremity edema ABDOMEN:abdomen soft, non-tender and normal bowel sounds Musculoskeletal:no cyanosis of digits and no clubbing  NEURO: alert & oriented x 3 with fluent speech, no focal motor/sensory deficits   LABORATORY DATA:  I have reviewed the data as listed   Chemistry      Component Value Date/Time   NA 144 10/09/2014 0752   NA 141 08/12/2013 0923   K 4.2 10/09/2014 0752   K 4.2 08/12/2013 0923   CL 106 08/12/2013 0923   CL 107 04/05/2012 1253   CO2 26 10/09/2014 0752   CO2 27 08/12/2013 0923   BUN 16.0 10/09/2014 0752   BUN 20 08/12/2013 0923   CREATININE 0.8 10/09/2014 0752   CREATININE 0.6 08/12/2013 0923      Component Value  Date/Time   CALCIUM 9.7 10/09/2014 0752   CALCIUM 9.8 08/12/2013 0923   ALKPHOS 95 10/09/2014 0752   ALKPHOS 106 08/12/2013 0923   AST 56* 10/09/2014 0752   AST 42* 08/12/2013 0923   ALT 61* 10/09/2014 0752   ALT 52* 08/12/2013 0923   BILITOT 0.54 10/09/2014 0752   BILITOT 0.7 08/12/2013 0923       Lab Results  Component Value Date   WBC 5.0 11/06/2014   HGB 14.3 11/06/2014   HCT 42.6 11/06/2014   MCV 96.8 11/06/2014   PLT 138* 11/06/2014   NEUTROABS 3.2 11/06/2014    ASSESSMENT & PLAN:  Follicular 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 Retroperitoneal mass biopsy left side 07/24/2014: Follicular lymphoma low-grade grade 1-2, flow cytometry positive for CD10, 20, 3, 10, 5, BCL-2, BCl-6, CD21, Ki-67 less than 10%, stage IB (8X 4 cm mass)  Treatment plan:systemic chemotherapy with bendamustine and Rituxan 6 cycles followed by Rituxan maintenance every 2 months for 2 years  Current treatment: Cycle 4 bendamustine and Rituxan Chemotherapy toxicities: 1. First infusion reaction to Rituxan 2. Fatigue 3. Anxiety due to dexamethasone: I discontinued oral dexamethasone for the next chemotherapy.   4. Insomnia: benadryl QHS  Severe anxiety: Currently on venlafaxine as well as Ativan at bedtime for sleep. I renewed Effexor today. She will speak to her primary care physician about any other alternatives for anxiety. I offered our counselors to help with relaxation techniques but patient refused.  Radiology review: Ct scans 11/03/14: Left periaortic nodal mass is decreased in size, now measuring 1.6 x 3.2 cm (previously 4.2 x 7.5 cm).  Breast cancer of upper-outer quadrant of right female breast ([place]) Right breast invasive ductal carcinoma status post mastectomy 2 foci of cancer 0.2 cm, grade 1 with extensive background DCIS, 0/5 lymph nodes negative, ER 94%, PR 100%, Ki-67 5%, HER-2 negative. Status post antiestrogen therapy with letrozole 05/02/2011 currently on hold since  08/13/2014  Surveillance: Left breast mammogram 06/30/2014 is normal  Letrozole is on hold while the patient is on chemotherapy for follicular lymphoma   No orders of the defined types were placed in this encounter.   The patient has a good understanding of the overall plan. she agrees with it. she will call with any problems that may develop before the next visit here.   Rulon Eisenmenger, MD 11/06/2014

## 2014-11-06 NOTE — Assessment & Plan Note (Signed)
Right breast invasive ductal carcinoma status post mastectomy 2 foci of cancer 0.2 cm, grade 1 with extensive background DCIS, 0/5 lymph nodes negative, ER 94%, PR 100%, Ki-67 5%, HER-2 negative. Status post antiestrogen therapy with letrozole 05/02/2011 currently on hold since 08/13/2014  Surveillance: Left breast mammogram 06/30/2014 is normal  Letrozole is on hold while the patient is on chemotherapy for follicular lymphoma 

## 2014-11-06 NOTE — Telephone Encounter (Signed)
Appointments made and avs printed for patient °

## 2014-11-07 ENCOUNTER — Ambulatory Visit (HOSPITAL_BASED_OUTPATIENT_CLINIC_OR_DEPARTMENT_OTHER): Payer: Medicare Other

## 2014-11-07 VITALS — BP 134/84 | HR 85 | Temp 97.9°F | Resp 16

## 2014-11-07 DIAGNOSIS — Z5111 Encounter for antineoplastic chemotherapy: Secondary | ICD-10-CM

## 2014-11-07 DIAGNOSIS — C8203 Follicular lymphoma grade I, intra-abdominal lymph nodes: Secondary | ICD-10-CM

## 2014-11-07 MED ORDER — HEPARIN SOD (PORK) LOCK FLUSH 100 UNIT/ML IV SOLN
500.0000 [IU] | Freq: Once | INTRAVENOUS | Status: AC | PRN
  Administered 2014-11-07: 500 [IU]
  Filled 2014-11-07: qty 5

## 2014-11-07 MED ORDER — SODIUM CHLORIDE 0.9 % IJ SOLN
10.0000 mL | INTRAMUSCULAR | Status: DC | PRN
  Administered 2014-11-07: 10 mL
  Filled 2014-11-07: qty 10

## 2014-11-07 MED ORDER — SODIUM CHLORIDE 0.9 % IV SOLN
Freq: Once | INTRAVENOUS | Status: AC
  Administered 2014-11-07: 11:00:00 via INTRAVENOUS

## 2014-11-07 MED ORDER — SODIUM CHLORIDE 0.9 % IV SOLN
75.0000 mg/m2 | Freq: Once | INTRAVENOUS | Status: AC
  Administered 2014-11-07: 150 mg via INTRAVENOUS
  Filled 2014-11-07: qty 6

## 2014-11-07 MED ORDER — SODIUM CHLORIDE 0.9 % IV SOLN
Freq: Once | INTRAVENOUS | Status: AC
  Administered 2014-11-07: 11:00:00 via INTRAVENOUS
  Filled 2014-11-07: qty 4

## 2014-11-07 NOTE — Patient Instructions (Signed)
[facility] Discharge Instructions for Patients Receiving Chemotherapy  Today you received the following chemotherapy agents Bendeka.  To help prevent nausea and vomiting after your treatment, we encourage you to take your nausea medication.   If you develop nausea and vomiting that is not controlled by your nausea medication, call the clinic.   BELOW ARE SYMPTOMS THAT SHOULD BE REPORTED IMMEDIATELY:  *FEVER GREATER THAN 100.5 F  *CHILLS WITH OR WITHOUT FEVER  NAUSEA AND VOMITING THAT IS NOT CONTROLLED WITH YOUR NAUSEA MEDICATION  *UNUSUAL SHORTNESS OF BREATH  *UNUSUAL BRUISING OR BLEEDING  TENDERNESS IN MOUTH AND THROAT WITH OR WITHOUT PRESENCE OF ULCERS  *URINARY PROBLEMS  *BOWEL PROBLEMS  UNUSUAL RASH Items with * indicate a potential emergency and should be followed up as soon as possible.  Feel free to call the clinic you have any questions or concerns. The clinic phone number is [phone].  Please show the CHEMO ALERT CARD at check-in to the Emergency Department and triage nurse.   

## 2014-11-17 DIAGNOSIS — H524 Presbyopia: Secondary | ICD-10-CM | POA: Diagnosis not present

## 2014-11-25 DIAGNOSIS — X32XXXA Exposure to sunlight, initial encounter: Secondary | ICD-10-CM | POA: Diagnosis not present

## 2014-11-25 DIAGNOSIS — D225 Melanocytic nevi of trunk: Secondary | ICD-10-CM | POA: Diagnosis not present

## 2014-11-25 DIAGNOSIS — L570 Actinic keratosis: Secondary | ICD-10-CM | POA: Diagnosis not present

## 2014-12-02 ENCOUNTER — Other Ambulatory Visit: Payer: Self-pay | Admitting: *Deleted

## 2014-12-02 DIAGNOSIS — C8203 Follicular lymphoma grade I, intra-abdominal lymph nodes: Secondary | ICD-10-CM

## 2014-12-02 MED ORDER — ACYCLOVIR 400 MG PO TABS: 400.0000 mg | ORAL_TABLET | Freq: Every day | ORAL | Status: DC

## 2014-12-04 ENCOUNTER — Ambulatory Visit (HOSPITAL_BASED_OUTPATIENT_CLINIC_OR_DEPARTMENT_OTHER): Payer: Medicare Other | Admitting: Hematology and Oncology

## 2014-12-04 ENCOUNTER — Ambulatory Visit (HOSPITAL_BASED_OUTPATIENT_CLINIC_OR_DEPARTMENT_OTHER): Payer: Medicare Other

## 2014-12-04 ENCOUNTER — Other Ambulatory Visit (HOSPITAL_BASED_OUTPATIENT_CLINIC_OR_DEPARTMENT_OTHER): Payer: Medicare Other

## 2014-12-04 ENCOUNTER — Telehealth: Payer: Self-pay | Admitting: Hematology and Oncology

## 2014-12-04 ENCOUNTER — Encounter: Payer: Self-pay | Admitting: Hematology and Oncology

## 2014-12-04 VITALS — BP 156/70 | HR 86 | Temp 97.9°F | Resp 18

## 2014-12-04 VITALS — BP 152/63 | HR 87 | Temp 97.9°F | Resp 20 | Wt 208.1 lb

## 2014-12-04 DIAGNOSIS — Z5112 Encounter for antineoplastic immunotherapy: Secondary | ICD-10-CM

## 2014-12-04 DIAGNOSIS — F418 Other specified anxiety disorders: Secondary | ICD-10-CM

## 2014-12-04 DIAGNOSIS — C8203 Follicular lymphoma grade I, intra-abdominal lymph nodes: Secondary | ICD-10-CM

## 2014-12-04 DIAGNOSIS — R5383 Other fatigue: Secondary | ICD-10-CM

## 2014-12-04 DIAGNOSIS — C50411 Malignant neoplasm of upper-outer quadrant of right female breast: Secondary | ICD-10-CM | POA: Diagnosis not present

## 2014-12-04 DIAGNOSIS — G4700 Insomnia, unspecified: Secondary | ICD-10-CM

## 2014-12-04 LAB — CBC WITH DIFFERENTIAL/PLATELET
BASO%: 1 % (ref 0.0–2.0)
Basophils Absolute: 0.1 10*3/uL (ref 0.0–0.1)
EOS ABS: 0.6 10*3/uL — AB (ref 0.0–0.5)
EOS%: 9.6 % — AB (ref 0.0–7.0)
HCT: 42.9 % (ref 34.8–46.6)
HEMOGLOBIN: 14.7 g/dL (ref 11.6–15.9)
LYMPH%: 8.8 % — ABNORMAL LOW (ref 14.0–49.7)
MCH: 33.2 pg (ref 25.1–34.0)
MCHC: 34.3 g/dL (ref 31.5–36.0)
MCV: 96.8 fL (ref 79.5–101.0)
MONO#: 0.7 10*3/uL (ref 0.1–0.9)
MONO%: 11.3 % (ref 0.0–14.0)
NEUT%: 69.3 % (ref 38.4–76.8)
NEUTROS ABS: 4.3 10*3/uL (ref 1.5–6.5)
Platelets: 116 10*3/uL — ABNORMAL LOW (ref 145–400)
RBC: 4.43 10*6/uL (ref 3.70–5.45)
RDW: 13.2 % (ref 11.2–14.5)
WBC: 6.3 10*3/uL (ref 3.9–10.3)
lymph#: 0.6 10*3/uL — ABNORMAL LOW (ref 0.9–3.3)

## 2014-12-04 LAB — COMPREHENSIVE METABOLIC PANEL (CC13)
ALBUMIN: 3.5 g/dL (ref 3.5–5.0)
ALK PHOS: 95 U/L (ref 40–150)
ALT: 49 U/L (ref 0–55)
AST: 42 U/L — AB (ref 5–34)
Anion Gap: 10 mEq/L (ref 3–11)
BILIRUBIN TOTAL: 0.68 mg/dL (ref 0.20–1.20)
BUN: 13 mg/dL (ref 7.0–26.0)
CO2: 25 meq/L (ref 22–29)
Calcium: 9.8 mg/dL (ref 8.4–10.4)
Chloride: 108 mEq/L (ref 98–109)
Creatinine: 0.7 mg/dL (ref 0.6–1.1)
EGFR: 84 mL/min/{1.73_m2} — ABNORMAL LOW (ref >90)
GLUCOSE: 124 mg/dL (ref 70–140)
Potassium: 4 mEq/L (ref 3.5–5.1)
SODIUM: 143 meq/L (ref 136–145)
TOTAL PROTEIN: 6.2 g/dL — AB (ref 6.4–8.3)

## 2014-12-04 MED ORDER — SODIUM CHLORIDE 0.9 % IJ SOLN
10.0000 mL | INTRAMUSCULAR | Status: DC | PRN
  Administered 2014-12-04: 10 mL
  Filled 2014-12-04: qty 10

## 2014-12-04 MED ORDER — SODIUM CHLORIDE 0.9 % IV SOLN
375.0000 mg/m2 | Freq: Once | INTRAVENOUS | Status: AC
  Administered 2014-12-04: 800 mg via INTRAVENOUS
  Filled 2014-12-04: qty 80

## 2014-12-04 MED ORDER — SODIUM CHLORIDE 0.9 % IV SOLN
Freq: Once | INTRAVENOUS | Status: AC
  Administered 2014-12-04: 14:00:00 via INTRAVENOUS
  Filled 2014-12-04: qty 4

## 2014-12-04 MED ORDER — DIPHENHYDRAMINE HCL 25 MG PO CAPS
50.0000 mg | ORAL_CAPSULE | Freq: Once | ORAL | Status: AC
  Administered 2014-12-04: 50 mg via ORAL

## 2014-12-04 MED ORDER — HEPARIN SOD (PORK) LOCK FLUSH 100 UNIT/ML IV SOLN
500.0000 [IU] | Freq: Once | INTRAVENOUS | Status: AC | PRN
  Administered 2014-12-04: 500 [IU]
  Filled 2014-12-04: qty 5

## 2014-12-04 MED ORDER — SODIUM CHLORIDE 0.9 % IV SOLN
Freq: Once | INTRAVENOUS | Status: AC
  Administered 2014-12-04: 10:00:00 via INTRAVENOUS

## 2014-12-04 MED ORDER — BENDAMUSTINE HCL CHEMO INJECTION 100 MG/4ML
75.0000 mg/m2 | Freq: Once | INTRAVENOUS | Status: AC
  Administered 2014-12-04: 150 mg via INTRAVENOUS
  Filled 2014-12-04: qty 6

## 2014-12-04 MED ORDER — ACETAMINOPHEN 325 MG PO TABS
650.0000 mg | ORAL_TABLET | Freq: Once | ORAL | Status: AC
  Administered 2014-12-04: 650 mg via ORAL

## 2014-12-04 MED ORDER — ACETAMINOPHEN 325 MG PO TABS
ORAL_TABLET | ORAL | Status: AC
  Filled 2014-12-04: qty 2

## 2014-12-04 MED ORDER — DIPHENHYDRAMINE HCL 25 MG PO CAPS
ORAL_CAPSULE | ORAL | Status: AC
  Filled 2014-12-04: qty 2

## 2014-12-04 NOTE — Telephone Encounter (Signed)
Appointments made and avs printed for patient °

## 2014-12-04 NOTE — Assessment & Plan Note (Signed)
Retroperitoneal mass biopsy left side 02/56/1548: Follicular lymphoma low-grade grade 1-2, flow cytometry positive for CD10, 20, 3, 10, 5, BCL-2, BCl-6, CD21, Ki-67 less than 10%, stage IB (8X 4 cm mass)  Treatment plan:systemic chemotherapy with bendamustine and Rituxan 6 cycles followed by Rituxan maintenance every 2 months for 2 years  Current treatment: Cycle 5 bendamustine and Rituxan  Radiology review: Ct scans 11/03/14: Left periaortic nodal mass is decreased in size, now measuring 1.6 x 3.2 cm (previously 4.2 x 7.5 cm).  Chemotherapy toxicities: 1. First infusion reaction to Rituxan 2. Fatigue 3. Anxiety due to dexamethasone: I discontinued oral dexamethasone for the next chemotherapy. 4. Insomnia: benadryl QHS  Severe anxiety: Currently on venlafaxine as well as Ativan at bedtime for sleep. I offered our counselors to help with relaxation techniques but patient refused.  Return to clinic in one month for cycle 6

## 2014-12-04 NOTE — Patient Instructions (Signed)
[facility] Discharge Instructions for Patients Receiving Chemotherapy  Today you received the following chemotherapy agents: Rituxan & Bendeka  To help prevent nausea and vomiting after your treatment, we encourage you to take your nausea medication as directed.    If you develop nausea and vomiting that is not controlled by your nausea medication, call the clinic.   BELOW ARE SYMPTOMS THAT SHOULD BE REPORTED IMMEDIATELY:  *FEVER GREATER THAN 100.5 F  *CHILLS WITH OR WITHOUT FEVER  NAUSEA AND VOMITING THAT IS NOT CONTROLLED WITH YOUR NAUSEA MEDICATION  *UNUSUAL SHORTNESS OF BREATH  *UNUSUAL BRUISING OR BLEEDING  TENDERNESS IN MOUTH AND THROAT WITH OR WITHOUT PRESENCE OF ULCERS  *URINARY PROBLEMS  *BOWEL PROBLEMS  UNUSUAL RASH Items with * indicate a potential emergency and should be followed up as soon as possible.  Feel free to call the clinic you have any questions or concerns. The clinic phone number is [phone].  Please show the CHEMO ALERT CARD at check-in to the Emergency Department and triage nurse.   

## 2014-12-04 NOTE — Progress Notes (Signed)
Patient Care Team: Marletta Lor, MD as PCP - General  DIAGNOSIS: Breast cancer of upper-outer quadrant of right female breast [facility])   Staging form: Breast, AJCC 7th Edition     Clinical: Tis (DCIS), NX, cM0 - Unsigned   SUMMARY OF ONCOLOGIC HISTORY:   Breast cancer of upper-outer quadrant of right female breast ([place])   04/14/2011 Surgery Right mastectomy: 2 foci of invasive ductal carcinoma 0.2 cm, grade 1 with background extensive DCIS, 0/5 lymph nodes negative,focus 1: ER 94%, PR 100%, Ki-67 5%, HER-2 negative: Focus to ER 100%BR 6% Ki-67 29% HER-2 negative   05/02/2011 -  Anti-estrogen oral therapy letrozole 2.5 mg daily    Follicular 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 ([name])   07/24/2014 Initial Diagnosis Retroperitoneal mass biopsy left side: Follicular lymphoma low-grade grade 1-2, flow cytometry positive for CD10, 20, 3, 10, 5, BCL-2, BCl-6, CD21, Ki-67 less than 10%   08/13/2014 -  Chemotherapy Bendamustine and Rituxan day 1, 2 every 4 weeks 6 cycles followed by Rituxan maintenance every 2 months 2 years   11/03/2014 Imaging midpoint of chemotherapy: Moderate improvement in the periaortic lymphadenopathy without complete resolution    CHIEF COMPLIANT: cycle 5 bendamustine Rituxan  INTERVAL HISTORY: Selena [name] is a 79 year old with above-mentioned history of right breast cancer and retroperitoneal mass which came back as follicular lymphoma currently on chemotherapy with bendamustine and Rituxan. She is tolerating the treatment extremely well. She is here today for cycle 5. Does not have any nausea vomiting. Energy levels are excellent. Denies any mouth sores or hair loss. She has frequent urination and occasional right leg swelling which are chronic symptoms.  REVIEW OF SYSTEMS:   Constitutional: Denies fevers, chills or abnormal weight loss Eyes: Denies blurriness of vision Ears, nose, mouth, throat, and face: Denies mucositis or sore throat Respiratory:  Denies cough, dyspnea or wheezes Cardiovascular: Denies palpitation, chest discomfort or lower extremity swelling Gastrointestinal:  Denies nausea, heartburn or change in bowel habits Skin: Denies abnormal skin rashes Lymphatics: Denies new lymphadenopathy or easy bruising Neurological:Denies numbness, tingling or new weaknesses Behavioral/Psych: Mood is stable, no new changes  Breast:  denies any pain or lumps or nodules in either breasts All other systems were reviewed with the patient and are negative.  I have reviewed the past medical history, past surgical history, social history and family history with the patient and they are unchanged from previous note.  ALLERGIES:  is allergic to codeine and codeine phosphate.  MEDICATIONS:  Current Outpatient Prescriptions  Medication Sig Dispense Refill  . acyclovir (ZOVIRAX) 400 MG tablet Take 1 tablet (400 mg total) by mouth daily. 30 tablet 3  . cholecalciferol (VITAMIN D) 1000 UNITS tablet Take 1,000 Units by mouth daily.    . diclofenac (VOLTAREN) 75 MG EC tablet TAKE 1 TABLET BY MOUTH TWICE DAILY (Patient taking differently: Take 75 mg by mouth daily. ) 180 tablet 3  . HYDROcodone-acetaminophen (NORCO) 10-325 MG per tablet TAKE 1 TABLET BY MOUTH EV ERY 6 HOURS AS NEEDED  0  . letrozole (FEMARA) 2.5 MG tablet TK 1 T PO  QD  3  . lidocaine-prilocaine (EMLA) cream Apply to affected area once (Patient taking differently: Apply 1 application topically once. For chemo port) 30 g 3  . LORazepam (ATIVAN) 0.5 MG tablet Take 1 tablet (0.5 mg total) by mouth every 8 (eight) hours as needed (Nausea or vomiting). 60 tablet 0  . ondansetron (ZOFRAN) 8 MG tablet Take 1 tablet (8 mg total)  by mouth 2 (two) times daily. Start the day after chemo for 2 days. Then take as needed for nausea or vomiting. (Patient not taking: Reported on 09/10/2014) 30 tablet 1  . prochlorperazine (COMPAZINE) 10 MG tablet Take 1 tablet (10 mg total) by mouth every 6 (six) hours  as needed (Nausea or vomiting). 30 tablet 1  . UNABLE TO FIND Rx: L8000- Post Surgical Bras (Quantity: 6) B1517- Silicone Breast Prosthesis (Quantity: 1) Dx: 174.9; Right Mastectomy 1 each 0  . venlafaxine XR (EFFEXOR-XR) 75 MG 24 hr capsule Take 1 capsule (75 mg total) by mouth daily with breakfast. 30 capsule 6   No current facility-administered medications for this visit.    PHYSICAL EXAMINATION: ECOG PERFORMANCE STATUS: 1 - Symptomatic but completely ambulatory  Filed Vitals:   12/04/14 0816  BP: 152/63  Pulse: 87  Temp: 97.9 F (36.6 C)  Resp: 20   Filed Weights   12/04/14 0816  Weight: 208 lb 1.6 oz (94.394 kg)    GENERAL:alert, no distress and comfortable SKIN: skin color, texture, turgor are normal, no rashes or significant lesions EYES: normal, Conjunctiva are pink and non-injected, sclera clear OROPHARYNX:no exudate, no erythema and lips, buccal mucosa, and tongue normal  NECK: supple, thyroid normal size, non-tender, without nodularity LYMPH:  no palpable lymphadenopathy in the cervical, axillary or inguinal LUNGS: clear to auscultation and percussion with normal breathing effort HEART: regular rate & rhythm and no murmurs and no lower extremity edema ABDOMEN:abdomen soft, non-tender and normal bowel sounds Musculoskeletal:no cyanosis of digits and no clubbing  NEURO: alert & oriented x 3 with fluent speech, no focal motor/sensory deficits  LABORATORY DATA:  I have reviewed the data as listed   Chemistry      Component Value Date/Time   NA 142 11/06/2014 0819   NA 141 08/12/2013 0923   K 4.2 11/06/2014 0819   K 4.2 08/12/2013 0923   CL 106 08/12/2013 0923   CL 107 04/05/2012 1253   CO2 25 11/06/2014 0819   CO2 27 08/12/2013 0923   BUN 17.6 11/06/2014 0819   BUN 20 08/12/2013 0923   CREATININE 0.7 11/06/2014 0819   CREATININE 0.6 08/12/2013 0923      Component Value Date/Time   CALCIUM 9.4 11/06/2014 0819   CALCIUM 9.8 08/12/2013 0923   ALKPHOS 94  11/06/2014 0819   ALKPHOS 106 08/12/2013 0923   AST 42* 11/06/2014 0819   AST 42* 08/12/2013 0923   ALT 52 11/06/2014 0819   ALT 52* 08/12/2013 0923   BILITOT 0.49 11/06/2014 0819   BILITOT 0.7 08/12/2013 0923       Lab Results  Component Value Date   WBC 6.3 12/04/2014   HGB 14.7 12/04/2014   HCT 42.9 12/04/2014   MCV 96.8 12/04/2014   PLT 116* 12/04/2014   NEUTROABS 4.3 12/04/2014   ASSESSMENT & PLAN:  Follicular 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 Retroperitoneal mass biopsy left side 61/60/7371: Follicular lymphoma low-grade grade 1-2, flow cytometry positive for CD10, 20, 3, 10, 5, BCL-2, BCl-6, CD21, Ki-67 less than 10%, stage IB (8X 4 cm mass)  Treatment plan:systemic chemotherapy with bendamustine and Rituxan 6 cycles followed by Rituxan maintenance every 2 months for 2 years  Current treatment: Cycle 5 bendamustine and Rituxan  Radiology review: Ct scans 11/03/14: Left periaortic nodal mass is decreased in size, now measuring 1.6 x 3.2 cm (previously 4.2 x 7.5 cm).  Chemotherapy toxicities: 1. First infusion reaction to Rituxan 2. Fatigue 3. Anxiety due to  dexamethasone: I discontinued oral dexamethasone for the next chemotherapy. 4. Insomnia: benadryl QHS  Severe anxiety: Currently on venlafaxine as well as Ativan at bedtime for sleep. I offered our counselors to help with relaxation techniques but patient refused.  Return to clinic in one month for cycle 6    Breast cancer of upper-outer quadrant of right female breast ([place]) Right breast invasive ductal carcinoma status post mastectomy 2 foci of cancer 0.2 cm, grade 1 with extensive background DCIS, 0/5 lymph nodes negative, ER 94%, PR 100%, Ki-67 5%, HER-2 negative. Status post antiestrogen therapy with letrozole 05/02/2011 currently on hold since 08/13/2014  Surveillance: Left breast mammogram 06/30/2014 is normal  Letrozole is on hold while the patient is on chemotherapy for follicular  lymphoma   No orders of the defined types were placed in this encounter.   The patient has a good understanding of the overall plan. she agrees with it. she will call with any problems that may develop before the next visit here.   Rulon Eisenmenger, MD 12/04/2014

## 2014-12-04 NOTE — Assessment & Plan Note (Signed)
Right breast invasive ductal carcinoma status post mastectomy 2 foci of cancer 0.2 cm, grade 1 with extensive background DCIS, 0/5 lymph nodes negative, ER 94%, PR 100%, Ki-67 5%, HER-2 negative. Status post antiestrogen therapy with letrozole 05/02/2011 currently on hold since 08/13/2014  Surveillance: Left breast mammogram 06/30/2014 is normal  Letrozole is on hold while the patient is on chemotherapy for follicular lymphoma

## 2014-12-05 ENCOUNTER — Ambulatory Visit (HOSPITAL_BASED_OUTPATIENT_CLINIC_OR_DEPARTMENT_OTHER): Payer: Medicare Other

## 2014-12-05 VITALS — BP 145/78 | HR 88 | Temp 98.0°F | Resp 18

## 2014-12-05 DIAGNOSIS — C8203 Follicular lymphoma grade I, intra-abdominal lymph nodes: Secondary | ICD-10-CM

## 2014-12-05 DIAGNOSIS — Z5111 Encounter for antineoplastic chemotherapy: Secondary | ICD-10-CM | POA: Diagnosis not present

## 2014-12-05 MED ORDER — SODIUM CHLORIDE 0.9 % IJ SOLN
10.0000 mL | INTRAMUSCULAR | Status: DC | PRN
  Administered 2014-12-05: 10 mL
  Filled 2014-12-05: qty 10

## 2014-12-05 MED ORDER — SODIUM CHLORIDE 0.9 % IV SOLN
Freq: Once | INTRAVENOUS | Status: AC
  Administered 2014-12-05: 11:00:00 via INTRAVENOUS

## 2014-12-05 MED ORDER — HEPARIN SOD (PORK) LOCK FLUSH 100 UNIT/ML IV SOLN
500.0000 [IU] | Freq: Once | INTRAVENOUS | Status: AC | PRN
  Administered 2014-12-05: 500 [IU]
  Filled 2014-12-05: qty 5

## 2014-12-05 MED ORDER — SODIUM CHLORIDE 0.9 % IV SOLN
75.0000 mg/m2 | Freq: Once | INTRAVENOUS | Status: AC
  Administered 2014-12-05: 150 mg via INTRAVENOUS
  Filled 2014-12-05: qty 6

## 2014-12-05 MED ORDER — SODIUM CHLORIDE 0.9 % IV SOLN
Freq: Once | INTRAVENOUS | Status: AC
  Administered 2014-12-05: 11:00:00 via INTRAVENOUS
  Filled 2014-12-05: qty 4

## 2014-12-05 NOTE — Patient Instructions (Signed)
[facility] Discharge Instructions for Patients Receiving Chemotherapy  Today you received the following chemotherapy agents Bendeka.   To help prevent nausea and vomiting after your treatment, we encourage you to take your nausea medication as directed.    If you develop nausea and vomiting that is not controlled by your nausea medication, call the clinic.   BELOW ARE SYMPTOMS THAT SHOULD BE REPORTED IMMEDIATELY:  *FEVER GREATER THAN 100.5 F  *CHILLS WITH OR WITHOUT FEVER  NAUSEA AND VOMITING THAT IS NOT CONTROLLED WITH YOUR NAUSEA MEDICATION  *UNUSUAL SHORTNESS OF BREATH  *UNUSUAL BRUISING OR BLEEDING  TENDERNESS IN MOUTH AND THROAT WITH OR WITHOUT PRESENCE OF ULCERS  *URINARY PROBLEMS  *BOWEL PROBLEMS  UNUSUAL RASH Items with * indicate a potential emergency and should be followed up as soon as possible.  Feel free to call the clinic you have any questions or concerns. The clinic phone number is [phone].  Please show the CHEMO ALERT CARD at check-in to the Emergency Department and triage nurse.   

## 2014-12-31 NOTE — Assessment & Plan Note (Signed)
Retroperitoneal mass biopsy left side 31/92/4383: Follicular lymphoma low-grade grade 1-2, flow cytometry positive for CD10, 20, 3, 10, 5, BCL-2, BCl-6, CD21, Ki-67 less than 10%, stage IB (8X 4 cm mass)  Treatment plan:systemic chemotherapy with bendamustine and Rituxan 6 cycles followed by Rituxan maintenance every 2 months for 2 years  Current treatment: Cycle 5 bendamustine and Rituxan  Radiology review: Ct scans 11/03/14: Left periaortic nodal mass is decreased in size, now measuring 1.6 x 3.2 cm (previously 4.2 x 7.5 cm).  Chemotherapy toxicities: 1. First infusion reaction to Rituxan 2. Fatigue 3. Anxiety due to dexamethasone: I discontinued oral dexamethasone for the next chemotherapy. 4. Insomnia: benadryl QHS  Severe anxiety: Currently on venlafaxine as well as Ativan at bedtime for sleep. I offered our counselors to help with relaxation techniques but patient refused.  RTC after PET CT scan and after that she will get maintenance therapy with Rituxan every other month

## 2015-01-01 ENCOUNTER — Ambulatory Visit (HOSPITAL_BASED_OUTPATIENT_CLINIC_OR_DEPARTMENT_OTHER): Payer: Medicare Other

## 2015-01-01 ENCOUNTER — Ambulatory Visit (HOSPITAL_BASED_OUTPATIENT_CLINIC_OR_DEPARTMENT_OTHER): Payer: Medicare Other | Admitting: Hematology and Oncology

## 2015-01-01 ENCOUNTER — Other Ambulatory Visit (HOSPITAL_BASED_OUTPATIENT_CLINIC_OR_DEPARTMENT_OTHER): Payer: Medicare Other

## 2015-01-01 ENCOUNTER — Encounter: Payer: Self-pay | Admitting: Hematology and Oncology

## 2015-01-01 ENCOUNTER — Other Ambulatory Visit: Payer: Self-pay | Admitting: Oncology

## 2015-01-01 ENCOUNTER — Telehealth: Payer: Self-pay | Admitting: Hematology and Oncology

## 2015-01-01 VITALS — BP 162/78 | HR 91 | Temp 97.8°F | Resp 18 | Ht 67.0 in | Wt 205.2 lb

## 2015-01-01 VITALS — BP 155/72 | HR 65 | Temp 97.6°F | Resp 17

## 2015-01-01 DIAGNOSIS — M5440 Lumbago with sciatica, unspecified side: Secondary | ICD-10-CM

## 2015-01-01 DIAGNOSIS — C8203 Follicular lymphoma grade I, intra-abdominal lymph nodes: Secondary | ICD-10-CM

## 2015-01-01 DIAGNOSIS — Z5112 Encounter for antineoplastic immunotherapy: Secondary | ICD-10-CM

## 2015-01-01 DIAGNOSIS — M545 Low back pain: Secondary | ICD-10-CM | POA: Diagnosis not present

## 2015-01-01 DIAGNOSIS — Z5111 Encounter for antineoplastic chemotherapy: Secondary | ICD-10-CM | POA: Diagnosis not present

## 2015-01-01 DIAGNOSIS — C50411 Malignant neoplasm of upper-outer quadrant of right female breast: Secondary | ICD-10-CM

## 2015-01-01 DIAGNOSIS — F418 Other specified anxiety disorders: Secondary | ICD-10-CM | POA: Diagnosis not present

## 2015-01-01 DIAGNOSIS — F419 Anxiety disorder, unspecified: Secondary | ICD-10-CM

## 2015-01-01 LAB — COMPREHENSIVE METABOLIC PANEL
ALBUMIN: 3.4 g/dL — AB (ref 3.5–5.0)
ALK PHOS: 113 U/L (ref 40–150)
ALT: 53 U/L (ref 0–55)
ANION GAP: 11 meq/L (ref 3–11)
AST: 47 U/L — AB (ref 5–34)
BILIRUBIN TOTAL: 0.59 mg/dL (ref 0.20–1.20)
BUN: 16.1 mg/dL (ref 7.0–26.0)
CALCIUM: 9.5 mg/dL (ref 8.4–10.4)
CO2: 24 mEq/L (ref 22–29)
CREATININE: 0.7 mg/dL (ref 0.6–1.1)
Chloride: 107 mEq/L (ref 98–109)
EGFR: 78 mL/min/{1.73_m2} — ABNORMAL LOW (ref >90)
Glucose: 122 mg/dl (ref 70–140)
Potassium: 4.1 mEq/L (ref 3.5–5.1)
Sodium: 143 mEq/L (ref 136–145)
TOTAL PROTEIN: 6.4 g/dL (ref 6.4–8.3)

## 2015-01-01 LAB — CBC WITH DIFFERENTIAL/PLATELET
BASO%: 1 % (ref 0.0–2.0)
Basophils Absolute: 0.1 10*3/uL (ref 0.0–0.1)
EOS%: 15.2 % — AB (ref 0.0–7.0)
Eosinophils Absolute: 0.9 10*3/uL — ABNORMAL HIGH (ref 0.0–0.5)
HEMATOCRIT: 43.4 % (ref 34.8–46.6)
HEMOGLOBIN: 14.6 g/dL (ref 11.6–15.9)
LYMPH#: 0.4 10*3/uL — AB (ref 0.9–3.3)
LYMPH%: 6.4 % — ABNORMAL LOW (ref 14.0–49.7)
MCH: 32.5 pg (ref 25.1–34.0)
MCHC: 33.6 g/dL (ref 31.5–36.0)
MCV: 96.7 fL (ref 79.5–101.0)
MONO#: 0.8 10*3/uL (ref 0.1–0.9)
MONO%: 13.9 % (ref 0.0–14.0)
NEUT#: 3.9 10*3/uL (ref 1.5–6.5)
NEUT%: 63.5 % (ref 38.4–76.8)
PLATELETS: 130 10*3/uL — AB (ref 145–400)
RBC: 4.49 10*6/uL (ref 3.70–5.45)
RDW: 12.9 % (ref 11.2–14.5)
WBC: 6.1 10*3/uL (ref 3.9–10.3)

## 2015-01-01 MED ORDER — DIPHENHYDRAMINE HCL 25 MG PO CAPS
50.0000 mg | ORAL_CAPSULE | Freq: Once | ORAL | Status: AC
  Administered 2015-01-01: 50 mg via ORAL

## 2015-01-01 MED ORDER — SODIUM CHLORIDE 0.9 % IJ SOLN
10.0000 mL | INTRAMUSCULAR | Status: DC | PRN
  Administered 2015-01-01: 10 mL
  Filled 2015-01-01: qty 10

## 2015-01-01 MED ORDER — SODIUM CHLORIDE 0.9 % IV SOLN
Freq: Once | INTRAVENOUS | Status: AC
  Administered 2015-01-01: 13:00:00 via INTRAVENOUS
  Filled 2015-01-01: qty 4

## 2015-01-01 MED ORDER — HYDROCODONE-ACETAMINOPHEN 5-325 MG PO TABS
1.0000 | ORAL_TABLET | Freq: Once | ORAL | Status: AC
  Administered 2015-01-01: 1 via ORAL

## 2015-01-01 MED ORDER — SODIUM CHLORIDE 0.9 % IV SOLN
75.0000 mg/m2 | Freq: Once | INTRAVENOUS | Status: AC
  Administered 2015-01-01: 150 mg via INTRAVENOUS
  Filled 2015-01-01: qty 6

## 2015-01-01 MED ORDER — HEPARIN SOD (PORK) LOCK FLUSH 100 UNIT/ML IV SOLN
500.0000 [IU] | Freq: Once | INTRAVENOUS | Status: AC | PRN
  Administered 2015-01-01: 500 [IU]
  Filled 2015-01-01: qty 5

## 2015-01-01 MED ORDER — SODIUM CHLORIDE 0.9 % IV SOLN
375.0000 mg/m2 | Freq: Once | INTRAVENOUS | Status: AC
  Administered 2015-01-01: 800 mg via INTRAVENOUS
  Filled 2015-01-01: qty 80

## 2015-01-01 MED ORDER — ACETAMINOPHEN 325 MG PO TABS
ORAL_TABLET | ORAL | Status: AC
  Filled 2015-01-01: qty 2

## 2015-01-01 MED ORDER — SODIUM CHLORIDE 0.9 % IV SOLN
Freq: Once | INTRAVENOUS | Status: AC
  Administered 2015-01-01: 09:00:00 via INTRAVENOUS

## 2015-01-01 MED ORDER — HYDROCODONE-ACETAMINOPHEN 5-325 MG PO TABS
ORAL_TABLET | ORAL | Status: AC
Start: 2015-01-01 — End: 2015-01-01
  Filled 2015-01-01: qty 1

## 2015-01-01 MED ORDER — VENLAFAXINE HCL ER 75 MG PO CP24: 75.0000 mg | ORAL_CAPSULE | Freq: Every day | ORAL | Status: DC

## 2015-01-01 MED ORDER — SODIUM CHLORIDE 0.9 % IV SOLN: Freq: Once | INTRAVENOUS | Status: AC

## 2015-01-01 MED ORDER — HYDROCODONE-ACETAMINOPHEN 10-325 MG PO TABS: 1.0000 | ORAL_TABLET | Freq: Four times a day (QID) | ORAL | Status: DC | PRN

## 2015-01-01 MED ORDER — DIPHENHYDRAMINE HCL 25 MG PO CAPS
ORAL_CAPSULE | ORAL | Status: AC
  Filled 2015-01-01: qty 2

## 2015-01-01 MED ORDER — ACETAMINOPHEN 325 MG PO TABS
650.0000 mg | ORAL_TABLET | Freq: Once | ORAL | Status: AC
  Administered 2015-01-01: 650 mg via ORAL

## 2015-01-01 NOTE — Patient Instructions (Signed)
[facility] Discharge Instructions for Patients Receiving Chemotherapy  Today you received the following chemotherapy agents Rituxan/Treanda.  To help prevent nausea and vomiting after your treatment, we encourage you to take your nausea medication as prescribed.   If you develop nausea and vomiting that is not controlled by your nausea medication, call the clinic.   BELOW ARE SYMPTOMS THAT SHOULD BE REPORTED IMMEDIATELY:  *FEVER GREATER THAN 100.5 F  *CHILLS WITH OR WITHOUT FEVER  NAUSEA AND VOMITING THAT IS NOT CONTROLLED WITH YOUR NAUSEA MEDICATION  *UNUSUAL SHORTNESS OF BREATH  *UNUSUAL BRUISING OR BLEEDING  TENDERNESS IN MOUTH AND THROAT WITH OR WITHOUT PRESENCE OF ULCERS  *URINARY PROBLEMS  *BOWEL PROBLEMS  UNUSUAL RASH Items with * indicate a potential emergency and should be followed up as soon as possible.  Feel free to call the clinic you have any questions or concerns. The clinic phone number is [phone].  Please show the CHEMO ALERT CARD at check-in to the Emergency Department and triage nurse.   

## 2015-01-01 NOTE — Progress Notes (Signed)
Patient Care Team: Marletta Lor, MD as PCP - General  DIAGNOSIS: Breast cancer of upper-outer quadrant of right female breast [facility])   Staging form: Breast, AJCC 7th Edition     Clinical: Tis (DCIS), NX, cM0 - Unsigned   SUMMARY OF ONCOLOGIC HISTORY:   Breast cancer of upper-outer quadrant of right female breast ([place])   04/14/2011 Surgery Right mastectomy: 2 foci of invasive ductal carcinoma 0.2 cm, grade 1 with background extensive DCIS, 0/5 lymph nodes negative,focus 1: ER 94%, PR 100%, Ki-67 5%, HER-2 negative: Focus to ER 100%BR 6% Ki-67 29% HER-2 negative   05/02/2011 -  Anti-estrogen oral therapy letrozole 2.5 mg daily    Follicular 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 ([name])   07/24/2014 Initial Diagnosis Retroperitoneal mass biopsy left side: Follicular lymphoma low-grade grade 1-2, flow cytometry positive for CD10, 20, 3, 10, 5, BCL-2, BCl-6, CD21, Ki-67 less than 10%   08/13/2014 -  Chemotherapy Bendamustine and Rituxan day 1, 2 every 4 weeks 6 cycles followed by Rituxan maintenance every 2 months 2 years   11/03/2014 Imaging midpoint of chemotherapy: Moderate improvement in the periaortic lymphadenopathy without complete resolution    CHIEF COMPLIANT: cycle 6 of bendamustine and Rituxan  INTERVAL HISTORY: Selena [name] is a 79 year old with above-mentioned history of follicular lymphoma with intra-abdominal mass. She is here today to receive cycle 6 of chemotherapy. Overall she is tolerating the treatment extremely well. She has diarrhea intermittently over the past month. This may be related to certain foods that she may be taking. She complains of a tender spot in the left buttock area which is very painful for which she takes Percocets.  REVIEW OF SYSTEMS:   Constitutional: Denies fevers, chills or abnormal weight loss Eyes: Denies blurriness of vision Ears, nose, mouth, throat, and face: Denies mucositis or sore throat Respiratory: Denies cough, dyspnea or  wheezes Cardiovascular: Denies palpitation, chest discomfort or lower extremity swelling Gastrointestinal:  Denies nausea, heartburn or change in bowel habits Skin: Denies abnormal skin rashes Lymphatics: Denies new lymphadenopathy or easy bruising Neurological:Denies numbness, tingling or new weaknesses, complains of low back pain Behavioral/Psych: Mood is stable, no new changes  Breast:  denies any pain or lumps or nodules in either breasts All other systems were reviewed with the patient and are negative.  I have reviewed the past medical history, past surgical history, social history and family history with the patient and they are unchanged from previous note.  ALLERGIES:  is allergic to codeine and codeine phosphate.  MEDICATIONS:  Current Outpatient Prescriptions  Medication Sig Dispense Refill  . cholecalciferol (VITAMIN D) 1000 UNITS tablet Take 1,000 Units by mouth daily.    . diclofenac (VOLTAREN) 75 MG EC tablet TAKE 1 TABLET BY MOUTH TWICE DAILY (Patient taking differently: Take 75 mg by mouth daily. ) 180 tablet 3  . HYDROcodone-acetaminophen (NORCO) 10-325 MG tablet Take 1 tablet by mouth every 6 (six) hours as needed. 30 tablet 0  . letrozole (FEMARA) 2.5 MG tablet TK 1 T PO  QD  3  . lidocaine-prilocaine (EMLA) cream Apply to affected area once (Patient taking differently: Apply 1 application topically once. For chemo port) 30 g 3  . UNABLE TO FIND Rx: L8000- Post Surgical Bras (Quantity: 6) O8416- Silicone Breast Prosthesis (Quantity: 1) Dx: 174.9; Right Mastectomy 1 each 0  . venlafaxine XR (EFFEXOR-XR) 75 MG 24 hr capsule Take 1 capsule (75 mg total) by mouth daily with breakfast. 30 capsule 6   No  current facility-administered medications for this visit.    PHYSICAL EXAMINATION: ECOG PERFORMANCE STATUS: 1 - Symptomatic but completely ambulatory  Filed Vitals:   01/01/15 0818  BP: 162/78  Pulse: 91  Temp: 97.8 F (36.6 C)  Resp: 18   Filed Weights    01/01/15 0818  Weight: 205 lb 3.2 oz (93.078 kg)    GENERAL:alert, no distress and comfortable SKIN: skin color, texture, turgor are normal, no rashes or significant lesions EYES: normal, Conjunctiva are pink and non-injected, sclera clear OROPHARYNX:no exudate, no erythema and lips, buccal mucosa, and tongue normal  NECK: supple, thyroid normal size, non-tender, without nodularity LYMPH:  no palpable lymphadenopathy in the cervical, axillary or inguinal LUNGS: clear to auscultation and percussion with normal breathing effort HEART: Atrial fibrillation ABDOMEN:abdomen soft, non-tender and normal bowel sounds Musculoskeletal:no cyanosis of digits and no clubbing  NEURO: alert & oriented x 3 with fluent speech, no focal motor/sensory deficits  LABORATORY DATA:  I have reviewed the data as listed   Chemistry      Component Value Date/Time   NA 143 12/04/2014 0809   NA 141 08/12/2013 0923   K 4.0 12/04/2014 0809   K 4.2 08/12/2013 0923   CL 106 08/12/2013 0923   CL 107 04/05/2012 1253   CO2 25 12/04/2014 0809   CO2 27 08/12/2013 0923   BUN 13.0 12/04/2014 0809   BUN 20 08/12/2013 0923   CREATININE 0.7 12/04/2014 0809   CREATININE 0.6 08/12/2013 0923      Component Value Date/Time   CALCIUM 9.8 12/04/2014 0809   CALCIUM 9.8 08/12/2013 0923   ALKPHOS 95 12/04/2014 0809   ALKPHOS 106 08/12/2013 0923   AST 42* 12/04/2014 0809   AST 42* 08/12/2013 0923   ALT 49 12/04/2014 0809   ALT 52* 08/12/2013 0923   BILITOT 0.68 12/04/2014 0809   BILITOT 0.7 08/12/2013 0923       Lab Results  Component Value Date   WBC 6.1 01/01/2015   HGB 14.6 01/01/2015   HCT 43.4 01/01/2015   MCV 96.7 01/01/2015   PLT 130* 01/01/2015   NEUTROABS 3.9 01/01/2015   ASSESSMENT & PLAN:  Breast cancer of upper-outer quadrant of right female breast ([place]) I instructed her that she could start back on letrozole starting January 1  Follicular lymphoma grade I of intra-abdominal lymph  nodes Retroperitoneal mass biopsy left side 92/42/6834: Follicular lymphoma low-grade grade 1-2, flow cytometry positive for CD10, 20, 3, 10, 5, BCL-2, BCl-6, CD21, Ki-67 less than 10%, stage IB (8X 4 cm mass)  Treatment plan:systemic chemotherapy with bendamustine and Rituxan 6 cycles followed by Rituxan maintenance every 2 months for 2 years  Current treatment: Cycle 6 bendamustine and Rituxan  Radiology review: Ct scans 11/03/14: Left periaortic nodal mass is decreased in size, now measuring 1.6 x 3.2 cm (previously 4.2 x 7.5 cm).  Chemotherapy toxicities: 1. First infusion reaction to Rituxan 2. Fatigue 3. Anxiety due to dexamethasone: I discontinued oral dexamethasone for the next chemotherapy. 4. Insomnia: benadryl QHS 5. Severe anxiety: Currently on venlafaxine as well as Ativan at bedtime for sleep. I offered our counselors to help with relaxation techniques but patient refused. 6. Low back pain: Left buttock area tender spot. Currently takes a medications with Percocets. Intermittent diarrhea: Could be related to her diet. She will watch and avoid certain foods that may be causing her diarrhea.  I renewed prescription for Percocets as well as Effexor.  RTC after PET CT scan and after that she will  get maintenance therapy with Rituxan every other month   Orders Placed This Encounter  Procedures  . NM PET Image Restag (PS) Skull Base To Thigh    Standing Status: Future     Number of Occurrences:      Standing Expiration Date: 01/01/2016    Order Specific Question:  Reason for Exam (SYMPTOM  OR DIAGNOSIS REQUIRED)    Answer:  Restaging Lymphoma after chemo    Order Specific Question:  Preferred imaging location?    Answer:  [facility]    Order Specific Question:  If indicated for the ordered procedure, I authorize the administration of a radiopharmaceutical per Radiology protocol    Answer:  Yes   The patient has a good understanding of the overall plan. she agrees  with it. she will call with any problems that may develop before the next visit here.   Rulon Eisenmenger, MD 01/01/2015

## 2015-01-01 NOTE — Addendum Note (Signed)
Addended by: Prentiss Bells on: 01/01/2015 10:17 AM   Modules accepted: Medications

## 2015-01-01 NOTE — Progress Notes (Signed)
Patient complains of lower right back pain. Rating the back pain a 7 on the 0 to 10 pain scale. Patient describes the pain as constant and sharp. Selena Lesser, NP notified. Order given and carried out for Vicodin 5/325 mg PO.

## 2015-01-01 NOTE — Telephone Encounter (Signed)
Appointments made and avs printed for patient °

## 2015-01-02 ENCOUNTER — Telehealth: Payer: Self-pay | Admitting: *Deleted

## 2015-01-02 ENCOUNTER — Other Ambulatory Visit: Payer: Self-pay | Admitting: *Deleted

## 2015-01-02 ENCOUNTER — Ambulatory Visit (HOSPITAL_BASED_OUTPATIENT_CLINIC_OR_DEPARTMENT_OTHER): Payer: Medicare Other

## 2015-01-02 VITALS — BP 168/67 | HR 93 | Temp 97.9°F | Resp 18

## 2015-01-02 DIAGNOSIS — C50411 Malignant neoplasm of upper-outer quadrant of right female breast: Secondary | ICD-10-CM

## 2015-01-02 DIAGNOSIS — C8203 Follicular lymphoma grade I, intra-abdominal lymph nodes: Secondary | ICD-10-CM

## 2015-01-02 DIAGNOSIS — M545 Low back pain: Secondary | ICD-10-CM | POA: Diagnosis not present

## 2015-01-02 DIAGNOSIS — Z5111 Encounter for antineoplastic chemotherapy: Secondary | ICD-10-CM

## 2015-01-02 MED ORDER — HYDROMORPHONE HCL 4 MG/ML IJ SOLN
INTRAMUSCULAR | Status: AC
  Filled 2015-01-02: qty 1

## 2015-01-02 MED ORDER — HEPARIN SOD (PORK) LOCK FLUSH 100 UNIT/ML IV SOLN
500.0000 [IU] | Freq: Once | INTRAVENOUS | Status: DC | PRN
  Filled 2015-01-02: qty 5

## 2015-01-02 MED ORDER — SODIUM CHLORIDE 0.9 % IJ SOLN
10.0000 mL | INTRAMUSCULAR | Status: DC | PRN
  Administered 2015-01-02: 10 mL
  Filled 2015-01-02: qty 10

## 2015-01-02 MED ORDER — SODIUM CHLORIDE 0.9 % IV SOLN
Freq: Once | INTRAVENOUS | Status: AC
  Administered 2015-01-02: 11:00:00 via INTRAVENOUS
  Filled 2015-01-02: qty 4

## 2015-01-02 MED ORDER — SODIUM CHLORIDE 0.9 % IV SOLN
Freq: Once | INTRAVENOUS | Status: AC
  Administered 2015-01-02: 10:00:00 via INTRAVENOUS

## 2015-01-02 MED ORDER — SODIUM CHLORIDE 0.9 % IV SOLN
75.0000 mg/m2 | Freq: Once | INTRAVENOUS | Status: AC
  Administered 2015-01-02: 150 mg via INTRAVENOUS
  Filled 2015-01-02: qty 6

## 2015-01-02 MED ORDER — HYDROMORPHONE HCL 1 MG/ML IJ SOLN
2.0000 mg | Freq: Once | INTRAMUSCULAR | Status: AC
  Administered 2015-01-02: 1 mg via INTRAVENOUS
  Filled 2015-01-02: qty 2

## 2015-01-02 NOTE — Progress Notes (Signed)
Pt c/o 9/10 R lower back pain. Pt states that she took 1 hydrocodone at 7am and advil at home without relief for pain. Pt was having difficulty walking in infusion today. Called Dr. Lindi Adie for x1 dose of pain medicine. Pt was ordered 2mg  IV dilaudid. Pt has had a hx of hallucinations with codeine before. Pt states that her pain level was down to 7/10 before giving dilaudid, and providing heat packs for her back. Gave pt 1mg  dilaudid and reasses her pain 1 hr later. She states that 1 mg dilaudid took all her pain away down to 1/10. Will waste remaining 1mg  dilaudid.

## 2015-01-02 NOTE — Telephone Encounter (Signed)
Received telephone advice record from [facility], sent to scan. Patient here today for treatment. Still with back pain and patient received additional pain medicine.

## 2015-01-06 ENCOUNTER — Encounter: Payer: Self-pay | Admitting: Internal Medicine

## 2015-01-06 ENCOUNTER — Ambulatory Visit (INDEPENDENT_AMBULATORY_CARE_PROVIDER_SITE_OTHER): Payer: Medicare Other | Admitting: Internal Medicine

## 2015-01-06 VITALS — BP 140/70 | HR 96 | Temp 97.7°F | Ht 67.0 in | Wt 204.0 lb

## 2015-01-06 DIAGNOSIS — M549 Dorsalgia, unspecified: Secondary | ICD-10-CM | POA: Diagnosis not present

## 2015-01-06 DIAGNOSIS — M150 Primary generalized (osteo)arthritis: Secondary | ICD-10-CM

## 2015-01-06 DIAGNOSIS — M159 Polyosteoarthritis, unspecified: Secondary | ICD-10-CM

## 2015-01-06 MED ORDER — DICLOFENAC SODIUM 75 MG PO TBEC: DELAYED_RELEASE_TABLET | ORAL | Status: DC

## 2015-01-06 NOTE — Patient Instructions (Signed)
Most patients with low back pain will improve with time over the next two to 6 weeks.  Keep active but avoid any activities that cause pain.  Apply moist heat to the low back area several times daily.  Call or return to clinic prn if these symptoms worsen or fail to improve as anticipated.  Back Pain, Adult Back pain is very common. The pain often gets better over time. The cause of back pain is usually not dangerous. Most people can learn to manage their back pain on their own.  HOME CARE  Watch your back pain for any changes. The following actions may help to lessen any pain you are feeling:  Stay active. Start with short walks on flat ground if you can. Try to walk farther each day.  Exercise regularly as told by your doctor. Exercise helps your back heal faster. It also helps avoid future injury by keeping your muscles strong and flexible.  Do not sit, drive, or stand in one place for more than 30 minutes.  Do not stay in bed. Resting more than 1-2 days can slow down your recovery.  Be careful when you bend or lift an object. Use good form when lifting:  Bend at your knees.  Keep the object close to your body.  Do not twist.  Sleep on a firm mattress. Lie on your side, and bend your knees. If you lie on your back, put a pillow under your knees.  Take medicines only as told by your doctor.  Put ice on the injured area.  Put ice in a plastic bag.  Place a towel between your skin and the bag.  Leave the ice on for 20 minutes, 2-3 times a day for the first 2-3 days. After that, you can switch between ice and heat packs.  Avoid feeling anxious or stressed. Find good ways to deal with stress, such as exercise.  Maintain a healthy weight. Extra weight puts stress on your back. GET HELP IF:   You have pain that does not go away with rest or medicine.  You have worsening pain that goes down into your legs or buttocks.  You have pain that does not get better in one  week.  You have pain at night.  You lose weight.  You have a fever or chills. GET HELP RIGHT AWAY IF:   You cannot control when you poop (bowel movement) or pee (urinate).  Your arms or legs feel weak.  Your arms or legs lose feeling (numbness).  You feel sick to your stomach (nauseous) or throw up (vomit).  You have belly (abdominal) pain.  You feel like you may pass out (faint).   This information is not intended to replace advice given to you by your health care provider. Make sure you discuss any questions you have with your health care provider.   Document Released: 06/29/2007 Document Revised: 01/31/2014 Document Reviewed: 05/14/2013 Elsevier Interactive Patient Education [employer].

## 2015-01-06 NOTE — Progress Notes (Signed)
Subjective:    Patient ID: Selena [name], female    DOB: December 10, 1935, 79 y.o.   MRN: [id number]  HPI  79 year old patient who has a history of osteoarthritis and low back pain.  4 days ago, she was reaching in an awkward position and had the onset of right lumbar pain.  She has been followed closely by oncology for follicular lymphoma and imaging studies have revealed improvement in lymphadenopathy.  She is scheduled for a PET scan next week.  Denies any radicular symptoms. She has been using Vicodin with benefit but does cause some hypersomnolence during the day. Lumbar MRI in June 2016 did reveal multilevel osteoarthritic changes as well as moderate to severe lumbar spinal stenosis  Past Medical History  Diagnosis Date  . BACK PAIN, UPPER 07/09/2008  . DIVERTICULOSIS, COLON 12/26/2006  . LIVER FUNCTION TESTS, ABNORMAL, HX OF 12/26/2007  . Memory loss 05/16/2007  . VERTIGO 09/07/2009  . Cataract   . Goiter     multi-nodular  . Hx of colonoscopy 2009  . Wears glasses   . Wears hearing aid     bilateral  . Wears dentures   . Complication of anesthesia   . PONV (postoperative nausea and vomiting)   . Atrial fibrillation ([place]) 01/01/2007  . Vertigo     hx of;had Phenergan prn   . OSTEOARTHRITIS 12/26/2006    takes Diclofenac daily but has stopped for surgery  . Bruises easily   . Diverticulitis   . Skin cancer   . Breast cancer, stage 1 ([place])   . Follicular non-Hodgkin's lymphoma ([place])   . Anemia     PMH    Social History   Social History  . Marital Status: Widowed    Spouse Name: N/A  . Number of Children: N/A  . Years of Education: N/A   Occupational History  . Not on file.   Social History Main Topics  . Smoking status: Never Smoker   . Smokeless tobacco: Never Used  . Alcohol Use: No  . Drug Use: No  . Sexual Activity: Yes    Birth Control/ Protection: Surgical   Other Topics Concern  . Not on file   Social History Narrative    Past Surgical History    Procedure Laterality Date  . Abdominal hysterectomy    . Rotator cuff repair  1999    right  . Knee surgery  2004/2010    arthroscopic/ bilateral knee replacements  . Cataract extraction w/ intraocular lens  implant, bilateral  2010    bilateral  . Colonoscopy    . Appendectomy    . Ablation of dysrhythmic focus  2009  . Tmi  1998  . Colonoscopy    . Breast biopsy  2013  . Breast reconstruction  04/14/2011    Procedure: BREAST RECONSTRUCTION;  Surgeon: Crissie Reese, MD;  Location: [place];  Service: Plastics;  Laterality: Right;  Placement of Right Breast Tissue Expander with  use of Flex HD for Breast Reconstruction  . Breast surgery      right sm, snbx  . Tonsillectomy    . Portacath placement Right 08/11/2014    Procedure: INSERTION PORT-A-CATH WITH ULTRASOUND;  Surgeon: Rolm Bookbinder, MD;  Location: [place];  Service: General;  Laterality: Right;    Family History  Problem Relation Age of Onset  . Colon cancer Mother   . Cancer Mother     colon  . Colon cancer Maternal Aunt   . Cancer Maternal Uncle   .  Prostate cancer Maternal Uncle   . Prostate cancer Maternal Uncle   . Other Maternal Uncle   . Anesthesia problems Neg Hx   . Hypotension Neg Hx   . Malignant hyperthermia Neg Hx   . Pseudochol deficiency Neg Hx   . Cancer Sister     kidney  . Cancer Brother     lymph node    Allergies  Allergen Reactions  . Codeine Other (See Comments)    Hallucinations   . Codeine Phosphate Other (See Comments)    Hallucinations    Current Outpatient Prescriptions on File Prior to Visit  Medication Sig Dispense Refill  . cholecalciferol (VITAMIN D) 1000 UNITS tablet Take 1,000 Units by mouth daily.    [name] HYDROcodone-acetaminophen (NORCO) 10-325 MG tablet Take 1 tablet by mouth every 6 (six) hours as needed. 30 tablet 0  . letrozole (FEMARA) 2.5 MG tablet TK 1 T PO  QD  3  . lidocaine-prilocaine (EMLA) cream Apply to affected area once (Patient taking differently: Apply 1  application topically once. For chemo port) 30 g 3  . LORazepam (ATIVAN) 0.5 MG tablet TK 1 T PO Q 8 H PRN NV  0  . UNABLE TO FIND Rx: L8000- Post Surgical Bras (Quantity: 6) [id number]- Silicone Breast Prosthesis (Quantity: 1) Dx: 174.9; Right Mastectomy 1 each 0  . venlafaxine XR (EFFEXOR-XR) 75 MG 24 hr capsule Take 1 capsule (75 mg total) by mouth daily with breakfast. 30 capsule 6  . acyclovir (ZOVIRAX) 400 MG tablet   3   No current facility-administered medications on file prior to visit.    BP 140/70 mmHg  Pulse 96  Temp(Src) 97.7 F (36.5 C) (Oral)  Ht 5\' 7"  (1.702 m)  Wt 204 lb (92.534 kg)  BMI 31.94 kg/m2  SpO2 98%     Review of Systems  Constitutional: Negative.   HENT: Negative for congestion, dental problem, hearing loss, rhinorrhea, sinus pressure, sore throat and tinnitus.   Eyes: Negative for pain, discharge and visual disturbance.  Respiratory: Negative for cough and shortness of breath.   Cardiovascular: Negative for chest pain, palpitations and leg swelling.  Gastrointestinal: Negative for nausea, vomiting, abdominal pain, diarrhea, constipation, blood in stool and abdominal distention.  Genitourinary: Negative for dysuria, urgency, frequency, hematuria, flank pain, vaginal bleeding, vaginal discharge, difficulty urinating, vaginal pain and pelvic pain.  Musculoskeletal: Positive for back pain. Negative for joint swelling, arthralgias and gait problem.  Skin: Negative for rash.  Neurological: Negative for dizziness, syncope, speech difficulty, weakness, numbness and headaches.  Hematological: Negative for adenopathy.  Psychiatric/Behavioral: Negative for behavioral problems, dysphoric mood and agitation. The patient is not nervous/anxious.        Objective:   Physical Exam  Constitutional: She appears well-developed and well-nourished. No distress.  Musculoskeletal:  Negative straight leg test Foot flexion and extension normal          Assessment &  Plan:   Low back pain.  Will continue symptomatic care.  Probably acute musculoligamentous strain and not related to neoplastic disease.  Patient is scheduled for PET scan next week.  Patient will report any clinical worsening Osteoarthritis

## 2015-01-06 NOTE — Progress Notes (Signed)
Pre visit review using our clinic review tool, if applicable. No additional management support is needed unless otherwise documented below in the visit note. 

## 2015-01-09 ENCOUNTER — Ambulatory Visit: Payer: Medicare Other | Admitting: Internal Medicine

## 2015-01-16 ENCOUNTER — Encounter (HOSPITAL_COMMUNITY)
Admission: RE | Admit: 2015-01-16 | Discharge: 2015-01-16 | Disposition: A | Discharge status: Home / Self Care | Payer: Medicare Other | Source: Ambulatory Visit | Attending: Hematology and Oncology | Admitting: Hematology and Oncology

## 2015-01-16 DIAGNOSIS — C8590 Non-Hodgkin lymphoma, unspecified, unspecified site: Secondary | ICD-10-CM | POA: Diagnosis not present

## 2015-01-16 DIAGNOSIS — C50411 Malignant neoplasm of upper-outer quadrant of right female breast: Secondary | ICD-10-CM | POA: Diagnosis not present

## 2015-01-16 LAB — GLUCOSE, CAPILLARY: GLUCOSE-CAPILLARY: 89 mg/dL (ref 65–99)

## 2015-01-16 MED ORDER — FLUDEOXYGLUCOSE F - 18 (FDG) INJECTION
9.9800 | Freq: Once | INTRAVENOUS | Status: AC | PRN
  Administered 2015-01-16: 9.98 via INTRAVENOUS

## 2015-01-29 ENCOUNTER — Encounter: Payer: Self-pay | Admitting: Hematology and Oncology

## 2015-01-29 ENCOUNTER — Telehealth: Payer: Self-pay | Admitting: Hematology and Oncology

## 2015-01-29 ENCOUNTER — Other Ambulatory Visit (HOSPITAL_BASED_OUTPATIENT_CLINIC_OR_DEPARTMENT_OTHER): Payer: Medicare Other

## 2015-01-29 ENCOUNTER — Ambulatory Visit (HOSPITAL_BASED_OUTPATIENT_CLINIC_OR_DEPARTMENT_OTHER): Payer: Medicare Other | Admitting: Hematology and Oncology

## 2015-01-29 VITALS — BP 152/76 | HR 80 | Temp 98.0°F | Resp 18 | Ht 67.0 in | Wt 201.1 lb

## 2015-01-29 DIAGNOSIS — C8203 Follicular lymphoma grade I, intra-abdominal lymph nodes: Secondary | ICD-10-CM | POA: Diagnosis not present

## 2015-01-29 DIAGNOSIS — C50411 Malignant neoplasm of upper-outer quadrant of right female breast: Secondary | ICD-10-CM | POA: Diagnosis not present

## 2015-01-29 LAB — CBC WITH DIFFERENTIAL/PLATELET
BASO%: 0.7 % (ref 0.0–2.0)
BASOS ABS: 0.1 10*3/uL (ref 0.0–0.1)
EOS%: 9.2 % — AB (ref 0.0–7.0)
Eosinophils Absolute: 0.6 10*3/uL — ABNORMAL HIGH (ref 0.0–0.5)
HEMATOCRIT: 43.3 % (ref 34.8–46.6)
HEMOGLOBIN: 14.5 g/dL (ref 11.6–15.9)
LYMPH#: 1.1 10*3/uL (ref 0.9–3.3)
LYMPH%: 15.6 % (ref 14.0–49.7)
MCH: 32.4 pg (ref 25.1–34.0)
MCHC: 33.5 g/dL (ref 31.5–36.0)
MCV: 96.9 fL (ref 79.5–101.0)
MONO#: 0.5 10*3/uL (ref 0.1–0.9)
MONO%: 7.4 % (ref 0.0–14.0)
NEUT%: 67.1 % (ref 38.4–76.8)
NEUTROS ABS: 4.7 10*3/uL (ref 1.5–6.5)
Platelets: 135 10*3/uL — ABNORMAL LOW (ref 145–400)
RBC: 4.47 10*6/uL (ref 3.70–5.45)
RDW: 13.3 % (ref 11.2–14.5)
WBC: 7 10*3/uL (ref 3.9–10.3)

## 2015-01-29 LAB — COMPREHENSIVE METABOLIC PANEL
ALBUMIN: 3.8 g/dL (ref 3.5–5.0)
ALK PHOS: 115 U/L (ref 40–150)
ALT: 72 U/L — AB (ref 0–55)
AST: 74 U/L — AB (ref 5–34)
Anion Gap: 9 mEq/L (ref 3–11)
BUN: 15.3 mg/dL (ref 7.0–26.0)
CALCIUM: 9.7 mg/dL (ref 8.4–10.4)
CO2: 27 mEq/L (ref 22–29)
CREATININE: 0.8 mg/dL (ref 0.6–1.1)
Chloride: 106 mEq/L (ref 98–109)
EGFR: 77 mL/min/{1.73_m2} — ABNORMAL LOW (ref >90)
GLUCOSE: 94 mg/dL (ref 70–140)
Potassium: 4.6 mEq/L (ref 3.5–5.1)
SODIUM: 142 meq/L (ref 136–145)
TOTAL PROTEIN: 6.8 g/dL (ref 6.4–8.3)
Total Bilirubin: 0.53 mg/dL (ref 0.20–1.20)

## 2015-01-29 MED ORDER — LETROZOLE 2.5 MG PO TABS: 2.5000 mg | ORAL_TABLET | Freq: Every day | ORAL | Status: DC

## 2015-01-29 NOTE — Progress Notes (Signed)
 Patient Care Team: Peter F Kwiatkowski, MD as PCP - General  DIAGNOSIS: Breast cancer of upper-outer quadrant of right female breast ([name])   Staging form: Breast, AJCC 7th Edition     Clinical: Tis (DCIS), NX, cM0 - Unsigned   SUMMARY OF ONCOLOGIC HISTORY:   Breast cancer of upper-outer quadrant of right female breast ([name])   04/14/2011 Surgery Right mastectomy: 2 foci of invasive ductal carcinoma 0.2 cm, grade 1 with background extensive DCIS, 0/5 lymph nodes negative,focus 1: ER 94%, PR 100%, Ki-67 5%, HER-2 negative: Focus to ER 100%BR 6% Ki-67 29% HER-2 negative   05/02/2011 -  Anti-estrogen oral therapy letrozole 2.5 mg daily    Follicular 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 ([name])   07/24/2014 Initial Diagnosis Retroperitoneal mass biopsy left side: Follicular lymphoma low-grade grade 1-2, flow cytometry positive for CD10, 20, 3, 10, 5, BCL-2, BCl-6, CD21, Ki-67 less than 10%   08/13/2014 -  Chemotherapy Bendamustine and Rituxan day 1, 2 every 4 weeks 6 cycles followed by Rituxan maintenance every 2 months 2 years   11/03/2014 Imaging midpoint of chemotherapy: Moderate improvement in the periaortic lymphadenopathy without complete resolution   01/16/2015 PET scan  interval decrease in size and metabolic activity of the large left retroperitoneal mass , no increase in activity, size 10 mm decreased from 48 mm  SUV 1.9 , no additional hypermetabolic activity    CHIEF COMPLIANT:   INTERVAL HISTORY: Selena [name] is a  Follow-up to review the PET CT scan 80-year-old with above-mentioned history of follicular lymphoma who received 6 cycles of bendamustine and Rituxan and is here today to discuss the PET CT scan. She reports she had done very well from it without any problems or concerns. The PET scan showed marked improvement in the retroperitoneal mass without any hypermetabolic activity.  REVIEW OF SYSTEMS:   Constitutional: Denies fevers, chills or abnormal weight  loss Eyes: Denies blurriness of vision Ears, nose, mouth, throat, and face: Denies mucositis or sore throat Respiratory: Denies cough, dyspnea or wheezes Cardiovascular: Denies palpitation, chest discomfort Gastrointestinal:  Denies nausea, heartburn or change in bowel habits Skin: Denies abnormal skin rashes Lymphatics: Denies new lymphadenopathy or easy bruising Neurological:Denies numbness, tingling or new weaknesses Behavioral/Psych: Mood is stable, no new changes  Extremities: No lower extremity edema Breast:  denies any pain or lumps or nodules in either breasts All other systems were reviewed with the patient and are negative.  I have reviewed the past medical history, past surgical history, social history and family history with the patient and they are unchanged from previous note.  ALLERGIES:  is allergic to codeine and codeine phosphate.  MEDICATIONS:  Current Outpatient Prescriptions  Medication Sig Dispense Refill  . acyclovir (ZOVIRAX) 400 MG tablet   3  . cholecalciferol (VITAMIN D) 1000 UNITS tablet Take 1,000 Units by mouth daily.    . diclofenac (VOLTAREN) 75 MG EC tablet TAKE 1 TABLET BY MOUTH TWICE DAILY 180 tablet 3  . HYDROcodone-acetaminophen (NORCO) 10-325 MG tablet Take 1 tablet by mouth every 6 (six) hours as needed. 30 tablet 0  . letrozole (FEMARA) 2.5 MG tablet Take 1 tablet (2.5 mg total) by mouth daily. 90 tablet 3  . lidocaine-prilocaine (EMLA) cream Apply to affected area once (Patient taking differently: Apply 1 application topically once. For chemo port) 30 g 3  . LORazepam (ATIVAN) 0.5 MG tablet TK 1 T PO Q 8 H PRN NV  0  . UNABLE TO FIND Rx: L8000-   Post Surgical Bras (Quantity: 6) V4008- Silicone Breast Prosthesis (Quantity: 1) Dx: 174.9; Right Mastectomy 1 each 0  . venlafaxine XR (EFFEXOR-XR) 75 MG 24 hr capsule Take 1 capsule (75 mg total) by mouth daily with breakfast. 30 capsule 6   No current facility-administered medications for this visit.     PHYSICAL EXAMINATION: ECOG PERFORMANCE STATUS: 0 - Asymptomatic  Filed Vitals:   01/29/15 1108  BP: 152/76  Pulse: 80  Temp: 98 F (36.7 C)  Resp: 18   Filed Weights   01/29/15 1108  Weight: 201 lb 1.6 oz (91.218 kg)    GENERAL:alert, no distress and comfortable SKIN: skin color, texture, turgor are normal, no rashes or significant lesions EYES: normal, Conjunctiva are pink and non-injected, sclera clear OROPHARYNX:no exudate, no erythema and lips, buccal mucosa, and tongue normal  NECK: supple, thyroid normal size, non-tender, without nodularity LYMPH:  no palpable lymphadenopathy in the cervical, axillary or inguinal LUNGS: clear to auscultation and percussion with normal breathing effort HEART: regular rate & rhythm and no murmurs and no lower extremity edema ABDOMEN:abdomen soft, non-tender and normal bowel sounds MUSCULOSKELETAL:no cyanosis of digits and no clubbing  NEURO: alert & oriented x 3 with fluent speech, no focal motor/sensory deficits EXTREMITIES: No lower extremity edema  LABORATORY DATA:  I have reviewed the data as listed   Chemistry      Component Value Date/Time   NA 143 01/01/2015 0809   NA 141 08/12/2013 0923   K 4.1 01/01/2015 0809   K 4.2 08/12/2013 0923   CL 106 08/12/2013 0923   CL 107 04/05/2012 1253   CO2 24 01/01/2015 0809   CO2 27 08/12/2013 0923   BUN 16.1 01/01/2015 0809   BUN 20 08/12/2013 0923   CREATININE 0.7 01/01/2015 0809   CREATININE 0.6 08/12/2013 0923      Component Value Date/Time   CALCIUM 9.5 01/01/2015 0809   CALCIUM 9.8 08/12/2013 0923   ALKPHOS 113 01/01/2015 0809   ALKPHOS 106 08/12/2013 0923   AST 47* 01/01/2015 0809   AST 42* 08/12/2013 0923   ALT 53 01/01/2015 0809   ALT 52* 08/12/2013 0923   BILITOT 0.59 01/01/2015 0809   BILITOT 0.7 08/12/2013 0923       Lab Results  Component Value Date   WBC 7.0 01/29/2015   HGB 14.5 01/29/2015   HCT 43.3 01/29/2015   MCV 96.9 01/29/2015   PLT 135*  01/29/2015   NEUTROABS 4.7 01/29/2015     ASSESSMENT & PLAN:  Follicular 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 Retroperitoneal mass biopsy left side 67/61/9509: Follicular lymphoma low-grade grade 1-2, flow cytometry positive for CD10, 20, 3, 10, 5, BCL-2, BCl-6, CD21, Ki-67 less than 10%, stage IB (8X 4 cm mass)  Treatment plan:systemic chemotherapy with bendamustine and Rituxan 6 cycles (  From 08/13/2014 to 01/01/2015) followed by Rituxan maintenance every 2 months for 2 years  PET/CT scan 01/16/2015: Interval decrease in size and metabolic activity of the large left retroperitoneal mass , no increase in activity, size 10 mm decreased from 48 mm  SUV 1.9 , no additional hypermetabolic activity  Radiology review: I discussed the PET/CT scanner provided her with a copy of this report. I described the findings as an excellent response to treatment minimal residual mass without any PET activity most likely to be scar tissue.  Plan: 1.  Continue maintenance therapy with Rituxan every 2 months for 2 years  Start 02/26/2015. 2.   Return to clinic with Rituxan maintenance #  2 in April 2017  Right breast cancer: continue with letrozole and in April 2018  No orders of the defined types were placed in this encounter.   The patient has a good understanding of the overall plan. she agrees with it. she will call with any problems that may develop before the next visit here.   ,  K, MD 01/29/2015      

## 2015-01-29 NOTE — Assessment & Plan Note (Signed)
Retroperitoneal mass biopsy left side 14/78/2956: Follicular lymphoma low-grade grade 1-2, flow cytometry positive for CD10, 20, 3, 10, 5, BCL-2, BCl-6, CD21, Ki-67 less than 10%, stage IB (8X 4 cm mass)  Treatment plan:systemic chemotherapy with bendamustine and Rituxan 6 cycles (  From 08/13/2014 to 01/01/2015) followed by Rituxan maintenance every 2 months for 2 years  PET/CT scan 01/16/2015: Interval decrease in size and metabolic activity of the large left retroperitoneal mass , no increase in activity, size 10 mm decreased from 48 mm  SUV 1.9 , no additional hypermetabolic activity  Radiology review: I discussed the PET/CT scanner provided her with a copy of this report. I described the findings as an excellent response to treatment minimal residual mass without any PET activity most likely to be scar tissue.  Plan: 1.  Continue maintenance therapy with Rituxan every 2 months for 2 years. 2.  Return to clinic in 2 months with the start of Rituxan maintenance.

## 2015-01-29 NOTE — Addendum Note (Signed)
Addended by: Prentiss Bells on: 01/29/2015 01:36 PM   Modules accepted: Medications

## 2015-01-29 NOTE — Assessment & Plan Note (Signed)
Continue with letrozole until April 2018

## 2015-01-29 NOTE — Telephone Encounter (Signed)
Appointments made and avs printed for patient °

## 2015-02-04 ENCOUNTER — Telehealth: Payer: Self-pay | Admitting: Internal Medicine

## 2015-02-04 ENCOUNTER — Telehealth: Payer: Self-pay | Admitting: *Deleted

## 2015-02-04 DIAGNOSIS — C50919 Malignant neoplasm of unspecified site of unspecified female breast: Secondary | ICD-10-CM | POA: Diagnosis not present

## 2015-02-04 MED ORDER — MECLIZINE HCL 25 MG PO TABS: 25.0000 mg | ORAL_TABLET | Freq: Four times a day (QID) | ORAL | Status: DC | PRN

## 2015-02-04 NOTE — Telephone Encounter (Signed)
Please see message and advise 

## 2015-02-04 NOTE — Telephone Encounter (Signed)
Meclizine 25 mg #60 one every 6 hours as needed for vertigo

## 2015-02-04 NOTE — Telephone Encounter (Signed)
Left message to call office

## 2015-02-04 NOTE — Telephone Encounter (Signed)
Pt is having vertigo and nausea and would like to see if md would call her medication into [facility] . Pt has had vertigo in past

## 2015-02-04 NOTE — Telephone Encounter (Signed)
Spoke to pt, told her Rx for Meclizine was sent to pharmacy to help with dizziness. Pt verbalized understanding.

## 2015-02-04 NOTE — Telephone Encounter (Signed)
Meclizine 25 mg every 6 hours as needed for vertigo #60

## 2015-02-09 NOTE — Telephone Encounter (Signed)
Pt called back and left message on my voicemail saying she does not need referral.

## 2015-02-09 NOTE — Telephone Encounter (Signed)
Left message on voicemail to call office.  

## 2015-02-25 ENCOUNTER — Other Ambulatory Visit: Payer: Self-pay | Admitting: Hematology and Oncology

## 2015-02-26 ENCOUNTER — Other Ambulatory Visit: Payer: Self-pay

## 2015-02-26 ENCOUNTER — Ambulatory Visit (HOSPITAL_BASED_OUTPATIENT_CLINIC_OR_DEPARTMENT_OTHER): Payer: Medicare Other

## 2015-02-26 ENCOUNTER — Other Ambulatory Visit (HOSPITAL_BASED_OUTPATIENT_CLINIC_OR_DEPARTMENT_OTHER): Payer: Medicare Other

## 2015-02-26 VITALS — BP 157/75 | HR 75 | Temp 97.9°F | Resp 20

## 2015-02-26 DIAGNOSIS — C8203 Follicular lymphoma grade I, intra-abdominal lymph nodes: Secondary | ICD-10-CM

## 2015-02-26 DIAGNOSIS — Z5112 Encounter for antineoplastic immunotherapy: Secondary | ICD-10-CM | POA: Diagnosis not present

## 2015-02-26 DIAGNOSIS — C50411 Malignant neoplasm of upper-outer quadrant of right female breast: Secondary | ICD-10-CM

## 2015-02-26 LAB — COMPREHENSIVE METABOLIC PANEL
ALT: 48 U/L (ref 0–55)
ANION GAP: 9 meq/L (ref 3–11)
AST: 43 U/L — ABNORMAL HIGH (ref 5–34)
Albumin: 3.6 g/dL (ref 3.5–5.0)
Alkaline Phosphatase: 97 U/L (ref 40–150)
BUN: 15.1 mg/dL (ref 7.0–26.0)
CALCIUM: 9.8 mg/dL (ref 8.4–10.4)
CHLORIDE: 108 meq/L (ref 98–109)
CO2: 25 mEq/L (ref 22–29)
Creatinine: 0.7 mg/dL (ref 0.6–1.1)
EGFR: 84 mL/min/{1.73_m2} — ABNORMAL LOW (ref >90)
Glucose: 91 mg/dl (ref 70–140)
POTASSIUM: 4.5 meq/L (ref 3.5–5.1)
Sodium: 143 mEq/L (ref 136–145)
Total Bilirubin: 0.5 mg/dL (ref 0.20–1.20)
Total Protein: 6.3 g/dL — ABNORMAL LOW (ref 6.4–8.3)

## 2015-02-26 LAB — CBC WITH DIFFERENTIAL/PLATELET
BASO%: 0.9 % (ref 0.0–2.0)
BASOS ABS: 0.1 10*3/uL (ref 0.0–0.1)
EOS%: 8.7 % — AB (ref 0.0–7.0)
Eosinophils Absolute: 0.5 10*3/uL (ref 0.0–0.5)
HEMATOCRIT: 41.7 % (ref 34.8–46.6)
HGB: 14.1 g/dL (ref 11.6–15.9)
LYMPH#: 0.7 10*3/uL — AB (ref 0.9–3.3)
LYMPH%: 12.6 % — AB (ref 14.0–49.7)
MCH: 32.6 pg (ref 25.1–34.0)
MCHC: 33.8 g/dL (ref 31.5–36.0)
MCV: 96.5 fL (ref 79.5–101.0)
MONO#: 0.7 10*3/uL (ref 0.1–0.9)
MONO%: 12 % (ref 0.0–14.0)
NEUT#: 3.7 10*3/uL (ref 1.5–6.5)
NEUT%: 65.8 % (ref 38.4–76.8)
PLATELETS: 134 10*3/uL — AB (ref 145–400)
RBC: 4.32 10*6/uL (ref 3.70–5.45)
RDW: 13.3 % (ref 11.2–14.5)
WBC: 5.7 10*3/uL (ref 3.9–10.3)

## 2015-02-26 MED ORDER — SODIUM CHLORIDE 0.9 % IJ SOLN
10.0000 mL | INTRAMUSCULAR | Status: DC | PRN
  Administered 2015-02-26: 10 mL
  Filled 2015-02-26: qty 10

## 2015-02-26 MED ORDER — DIPHENHYDRAMINE HCL 25 MG PO CAPS
50.0000 mg | ORAL_CAPSULE | Freq: Once | ORAL | Status: AC
  Administered 2015-02-26: 50 mg via ORAL

## 2015-02-26 MED ORDER — DIPHENHYDRAMINE HCL 25 MG PO CAPS
ORAL_CAPSULE | ORAL | Status: AC
  Filled 2015-02-26: qty 2

## 2015-02-26 MED ORDER — SODIUM CHLORIDE 0.9 % IV SOLN
375.0000 mg/m2 | Freq: Once | INTRAVENOUS | Status: AC
  Administered 2015-02-26: 800 mg via INTRAVENOUS
  Filled 2015-02-26: qty 70

## 2015-02-26 MED ORDER — HEPARIN SOD (PORK) LOCK FLUSH 100 UNIT/ML IV SOLN
500.0000 [IU] | Freq: Once | INTRAVENOUS | Status: AC | PRN
Start: 2015-02-26 — End: 2015-02-26
  Administered 2015-02-26: 500 [IU]
  Filled 2015-02-26: qty 5

## 2015-02-26 MED ORDER — ACETAMINOPHEN 325 MG PO TABS
650.0000 mg | ORAL_TABLET | Freq: Once | ORAL | Status: AC
Start: 2015-02-26 — End: 2015-02-26
  Administered 2015-02-26: 650 mg via ORAL

## 2015-02-26 MED ORDER — ACETAMINOPHEN 325 MG PO TABS
ORAL_TABLET | ORAL | Status: AC
  Filled 2015-02-26: qty 2

## 2015-02-26 MED ORDER — SODIUM CHLORIDE 0.9 % IV SOLN
Freq: Once | INTRAVENOUS | Status: AC
  Administered 2015-02-26: 09:00:00 via INTRAVENOUS

## 2015-02-26 NOTE — Patient Instructions (Signed)
Rituximab injection What is this medicine? RITUXIMAB (ri TUX i mab) is a monoclonal antibody. It is used commonly to treat non-Hodgkin lymphoma and other conditions. It is also used to treat rheumatoid arthritis (RA). In RA, this medicine slows the inflammatory process and help reduce joint pain and swelling. This medicine is often used with other cancer or arthritis medications. This medicine may be used for other purposes; ask your health care provider or pharmacist if you have questions. What should I tell my health care provider before I take this medicine? They need to know if you have any of these conditions: -blood disorders -heart disease -history of hepatitis B -infection (especially a virus infection such as chickenpox, cold sores, or herpes) -irregular heartbeat -kidney disease -lung or breathing disease, like asthma -lupus -an unusual or allergic reaction to rituximab, mouse proteins, other medicines, foods, dyes, or preservatives -pregnant or trying to get pregnant -breast-feeding How should I use this medicine? This medicine is for infusion into a vein. It is administered in a hospital or clinic by a specially trained health care professional. A special MedGuide will be given to you by the pharmacist with each prescription and refill. Be sure to read this information carefully each time. Talk to your pediatrician regarding the use of this medicine in children. This medicine is not approved for use in children. Overdosage: If you think you have taken too much of this medicine contact a poison control center or emergency room at once. NOTE: This medicine is only for you. Do not share this medicine with others. What if I miss a dose? It is important not to miss a dose. Call your doctor or health care professional if you are unable to keep an appointment. What may interact with this medicine? -cisplatin -medicines for blood pressure -some other medicines for  arthritis -vaccines This list may not describe all possible interactions. Give your health care provider a list of all the medicines, herbs, non-prescription drugs, or dietary supplements you use. Also tell them if you smoke, drink alcohol, or use illegal drugs. Some items may interact with your medicine. What should I watch for while using this medicine? Report any side effects that you notice during your treatment right away, such as changes in your breathing, fever, chills, dizziness or lightheadedness. These effects are more common with the first dose. Visit your prescriber or health care professional for checks on your progress. You will need to have regular blood work. Report any other side effects. The side effects of this medicine can continue after you finish your treatment. Continue your course of treatment even though you feel ill unless your doctor tells you to stop. Call your doctor or health care professional for advice if you get a fever, chills or sore throat, or other symptoms of a cold or flu. Do not treat yourself. This drug decreases your body's ability to fight infections. Try to avoid being around people who are sick. This medicine may increase your risk to bruise or bleed. Call your doctor or health care professional if you notice any unusual bleeding. Be careful brushing and flossing your teeth or using a toothpick because you may get an infection or bleed more easily. If you have any dental work done, tell your dentist you are receiving this medicine. Avoid taking products that contain aspirin, acetaminophen, ibuprofen, naproxen, or ketoprofen unless instructed by your doctor. These medicines may hide a fever. Do not become pregnant while taking this medicine. Women should inform their doctor if   they wish to become pregnant or think they might be pregnant. There is a potential for serious side effects to an unborn child. Talk to your health care professional or pharmacist for more  information. Do not breast-feed an infant while taking this medicine. What side effects may I notice from receiving this medicine? Side effects that you should report to your doctor or health care professional as soon as possible: -allergic reactions like skin rash, itching or hives, swelling of the face, lips, or tongue -low blood counts - this medicine may decrease the number of white blood cells, red blood cells and platelets. You may be at increased risk for infections and bleeding. -signs of infection - fever or chills, cough, sore throat, pain or difficulty passing urine -signs of decreased platelets or bleeding - bruising, pinpoint red spots on the skin, black, tarry stools, blood in the urine -signs of decreased red blood cells - unusually weak or tired, fainting spells, lightheadedness -breathing problems -confused, not responsive -chest pain -fast, irregular heartbeat -feeling faint or lightheaded, falls -mouth sores -redness, blistering, peeling or loosening of the skin, including inside the mouth -stomach pain -swelling of the ankles, feet, or hands -trouble passing urine or change in the amount of urine Side effects that usually do not require medical attention (report to your doctor or other health care professional if they continue or are bothersome): -anxiety -headache -loss of appetite -muscle aches -nausea -night sweats This list may not describe all possible side effects. Call your doctor for medical advice about side effects. You may report side effects to FDA at 1-800-FDA-1088. Where should I keep my medicine? This drug is given in a hospital or clinic and will not be stored at home. NOTE: This sheet is a summary. It may not cover all possible information. If you have questions about this medicine, talk to your doctor, pharmacist, or health care provider.    2016, Elsevier/Gold Standard. (2014-03-19 22:30:56)  

## 2015-03-02 ENCOUNTER — Telehealth: Payer: Self-pay | Admitting: *Deleted

## 2015-03-02 NOTE — Telephone Encounter (Signed)
Received telephone advice record from [facility], sent to scan. Patient received rx for Omnicef, attempted to call patient but no answer.

## 2015-03-03 ENCOUNTER — Ambulatory Visit (INDEPENDENT_AMBULATORY_CARE_PROVIDER_SITE_OTHER): Payer: Medicare Other | Admitting: Internal Medicine

## 2015-03-03 ENCOUNTER — Encounter: Payer: Self-pay | Admitting: Internal Medicine

## 2015-03-03 VITALS — BP 132/90 | HR 83 | Temp 98.4°F | Resp 20 | Ht 67.0 in | Wt 204.0 lb

## 2015-03-03 DIAGNOSIS — J209 Acute bronchitis, unspecified: Secondary | ICD-10-CM | POA: Diagnosis not present

## 2015-03-03 DIAGNOSIS — C50411 Malignant neoplasm of upper-outer quadrant of right female breast: Secondary | ICD-10-CM | POA: Diagnosis not present

## 2015-03-03 DIAGNOSIS — M159 Polyosteoarthritis, unspecified: Secondary | ICD-10-CM

## 2015-03-03 DIAGNOSIS — C8203 Follicular lymphoma grade I, intra-abdominal lymph nodes: Secondary | ICD-10-CM | POA: Diagnosis not present

## 2015-03-03 DIAGNOSIS — M150 Primary generalized (osteo)arthritis: Secondary | ICD-10-CM | POA: Diagnosis not present

## 2015-03-03 MED ORDER — HYDROCODONE-ACETAMINOPHEN 10-325 MG PO TABS: 1.0000 | ORAL_TABLET | Freq: Four times a day (QID) | ORAL | Status: DC | PRN

## 2015-03-03 NOTE — Progress Notes (Signed)
Subjective:    Patient ID: Selena [name], female    DOB: May 27, 1935, 79 y.o.   MRN: [id number]  HPI  80 year old patient who has a history of breast cancer as well as a recent diagnosis of lymphoma.  She received chemotherapy last week.  For the past 5 days she has had head and chest congestion with cough.  She is on antibiotic therapy as well as expectorants from oncology.  Cough is largely nonproductive.  She has osteoarthritis and a history of paroxysmal atrial fibrillation.  She has been intolerant of codeine but has tolerated hydrocodone well  Past Medical History  Diagnosis Date  . BACK PAIN, UPPER 07/09/2008  . DIVERTICULOSIS, COLON 12/26/2006  . LIVER FUNCTION TESTS, ABNORMAL, HX OF 12/26/2007  . Memory loss 05/16/2007  . VERTIGO 09/07/2009  . Cataract   . Goiter     multi-nodular  . Hx of colonoscopy 2009  . Wears glasses   . Wears hearing aid     bilateral  . Wears dentures   . Complication of anesthesia   . PONV (postoperative nausea and vomiting)   . Atrial fibrillation ([place]) 01/01/2007  . Vertigo     hx of;had Phenergan prn   . OSTEOARTHRITIS 12/26/2006    takes Diclofenac daily but has stopped for surgery  . Bruises easily   . Diverticulitis   . Skin cancer   . Breast cancer, stage 1 ([place])   . Follicular non-Hodgkin's lymphoma ([place])   . Anemia     PMH    Social History   Social History  . Marital Status: Widowed    Spouse Name: N/A  . Number of Children: N/A  . Years of Education: N/A   Occupational History  . Not on file.   Social History Main Topics  . Smoking status: Never Smoker   . Smokeless tobacco: Never Used  . Alcohol Use: No  . Drug Use: No  . Sexual Activity: Yes    Birth Control/ Protection: Surgical   Other Topics Concern  . Not on file   Social History Narrative    Past Surgical History  Procedure Laterality Date  . Abdominal hysterectomy    . Rotator cuff repair  1999    right  . Knee surgery  2004/2010    arthroscopic/  bilateral knee replacements  . Cataract extraction w/ intraocular lens  implant, bilateral  2010    bilateral  . Colonoscopy    . Appendectomy    . Ablation of dysrhythmic focus  2009  . Tmi  1998  . Colonoscopy    . Breast biopsy  2013  . Breast reconstruction  04/14/2011    Procedure: BREAST RECONSTRUCTION;  Surgeon: Crissie Reese, MD;  Location: [facility];  Service: Plastics;  Laterality: Right;  Placement of Right Breast Tissue Expander with  use of Flex HD for Breast Reconstruction  . Breast surgery      right sm, snbx  . Tonsillectomy    . Portacath placement Right 08/11/2014    Procedure: INSERTION PORT-A-CATH WITH ULTRASOUND;  Surgeon: Rolm Bookbinder, MD;  Location: [facility];  Service: General;  Laterality: Right;    Family History  Problem Relation Age of Onset  . Colon cancer Mother   . Cancer Mother     colon  . Colon cancer Maternal Aunt   . Cancer Maternal Uncle   . Prostate cancer Maternal Uncle   . Prostate cancer Maternal Uncle   . Other Maternal Uncle   . Anesthesia  problems Neg Hx   . Hypotension Neg Hx   . Malignant hyperthermia Neg Hx   . Pseudochol deficiency Neg Hx   . Cancer Sister     kidney  . Cancer Brother     lymph node    Allergies  Allergen Reactions  . Codeine Other (See Comments)    Hallucinations   . Codeine Phosphate Other (See Comments)    Hallucinations    Current Outpatient Prescriptions on File Prior to Visit  Medication Sig Dispense Refill  . acyclovir (ZOVIRAX) 400 MG tablet   3  . cholecalciferol (VITAMIN D) 1000 UNITS tablet Take 1,000 Units by mouth daily.    . diclofenac (VOLTAREN) 75 MG EC tablet TAKE 1 TABLET BY MOUTH TWICE DAILY 180 tablet 3  . HYDROcodone-acetaminophen (NORCO) 10-325 MG tablet Take 1 tablet by mouth every 6 (six) hours as needed. 30 tablet 0  . letrozole (FEMARA) 2.5 MG tablet Take 1 tablet (2.5 mg total) by mouth daily. 90 tablet 3  . LORazepam (ATIVAN) 0.5 MG tablet TK 1 T PO Q 8 H PRN NV  0  . meclizine  (ANTIVERT) 25 MG tablet Take 1 tablet (25 mg total) by mouth every 6 (six) hours as needed for dizziness. 60 tablet 0  . UNABLE TO FIND Rx: L8000- Post Surgical Bras (Quantity: 6) [id number]- Silicone Breast Prosthesis (Quantity: 1) Dx: 174.9; Right Mastectomy 1 each 0  . venlafaxine XR (EFFEXOR-XR) 75 MG 24 hr capsule Take 1 capsule (75 mg total) by mouth daily with breakfast. 30 capsule 6   No current facility-administered medications on file prior to visit.    BP 132/90 mmHg  Pulse 83  Temp(Src) 98.4 F (36.9 C) (Oral)  Resp 20  Ht 5\' 7"  (1.702 m)  Wt 204 lb (92.534 kg)  BMI 31.94 kg/m2  SpO2 98%     Review of Systems  Constitutional: Positive for activity change, appetite change and fatigue.  HENT: Positive for congestion. Negative for dental problem, hearing loss, rhinorrhea, sinus pressure, sore throat and tinnitus.   Eyes: Negative for pain, discharge and visual disturbance.  Respiratory: Positive for cough. Negative for shortness of breath.   Cardiovascular: Negative for chest pain, palpitations and leg swelling.  Gastrointestinal: Negative for nausea, vomiting, abdominal pain, diarrhea, constipation, blood in stool and abdominal distention.  Genitourinary: Negative for dysuria, urgency, frequency, hematuria, flank pain, vaginal bleeding, vaginal discharge, difficulty urinating, vaginal pain and pelvic pain.  Musculoskeletal: Negative for joint swelling, arthralgias and gait problem.  Skin: Negative for rash.  Neurological: Negative for dizziness, syncope, speech difficulty, weakness, numbness and headaches.  Hematological: Negative for adenopathy.  Psychiatric/Behavioral: Negative for behavioral problems, dysphoric mood and agitation. The patient is not nervous/anxious.        Objective:   Physical Exam  Constitutional: She is oriented to person, place, and time. She appears well-developed and well-nourished.  HENT:  Head: Normocephalic.  Right Ear: External ear normal.   Left Ear: External ear normal.  Mouth/Throat: Oropharynx is clear and moist.  Eyes: Conjunctivae and EOM are normal. Pupils are equal, round, and reactive to light.  Neck: Normal range of motion. Neck supple. No thyromegaly present.  Cardiovascular: Normal rate, regular rhythm, normal heart sounds and intact distal pulses.   Pulmonary/Chest: Effort normal. No respiratory distress. She has no wheezes. She has no rales.  Few scattered coarse rhonchi  Abdominal: Soft. Bowel sounds are normal. She exhibits no mass. There is no tenderness.  Musculoskeletal: Normal range of motion.  Lymphadenopathy:  She has no cervical adenopathy.  Neurological: She is alert and oriented to person, place, and time.  Skin: Skin is warm and dry. No rash noted.  Psychiatric: She has a normal mood and affect. Her behavior is normal.          Assessment & Plan:   Acute viral bronchitis History of follicular lymphoma Osteoarthritis  We'll continue present antibiotic therapy Continue expectorants Will refill prescription for Vicodin for pain and cough control

## 2015-03-03 NOTE — Progress Notes (Signed)
Pre visit review using our clinic review tool, if applicable. No additional management support is needed unless otherwise documented below in the visit note. 

## 2015-03-03 NOTE — Patient Instructions (Signed)
Take over-the-counter expectorants and cough medications such as  Mucinex DM.  Call if there is no improvement in 5 to 7 days or if  you develop worsening cough, fever, or new symptoms, such as shortness of breath or chest pain.  HOME CARE INSTRUCTIONS  Drink plenty of water. Water helps thin the mucus so your sinuses can drain more easily.  Use a humidifier.  Inhale steam 3-4 times a day (for example, sit in the bathroom with the shower running).  Apply a warm, moist washcloth to your face 3-4 times a day, or as directed by your health care provider.  Use saline nasal sprays to help moisten and clean your sinuses.  Take medicines only as directed by your health care provider.  If you were prescribed either an antibiotic or antifungal medicine, finish it all even if you start to feel better.

## 2015-04-02 DIAGNOSIS — H903 Sensorineural hearing loss, bilateral: Secondary | ICD-10-CM | POA: Diagnosis not present

## 2015-04-21 ENCOUNTER — Other Ambulatory Visit: Payer: Self-pay

## 2015-04-21 DIAGNOSIS — Z1231 Encounter for screening mammogram for malignant neoplasm of breast: Secondary | ICD-10-CM

## 2015-04-30 ENCOUNTER — Other Ambulatory Visit (HOSPITAL_BASED_OUTPATIENT_CLINIC_OR_DEPARTMENT_OTHER): Payer: Medicare Other

## 2015-04-30 ENCOUNTER — Telehealth: Payer: Self-pay | Admitting: Hematology and Oncology

## 2015-04-30 ENCOUNTER — Encounter: Payer: Self-pay | Admitting: Hematology and Oncology

## 2015-04-30 ENCOUNTER — Other Ambulatory Visit: Payer: Self-pay

## 2015-04-30 ENCOUNTER — Ambulatory Visit (HOSPITAL_BASED_OUTPATIENT_CLINIC_OR_DEPARTMENT_OTHER): Payer: Medicare Other

## 2015-04-30 ENCOUNTER — Ambulatory Visit (HOSPITAL_BASED_OUTPATIENT_CLINIC_OR_DEPARTMENT_OTHER): Payer: Medicare Other | Admitting: Hematology and Oncology

## 2015-04-30 VITALS — BP 161/68 | HR 71 | Temp 97.8°F | Resp 18 | Ht 67.0 in | Wt 203.2 lb

## 2015-04-30 VITALS — BP 132/68 | HR 63 | Temp 97.9°F | Resp 17

## 2015-04-30 DIAGNOSIS — C50411 Malignant neoplasm of upper-outer quadrant of right female breast: Secondary | ICD-10-CM | POA: Diagnosis not present

## 2015-04-30 DIAGNOSIS — C8203 Follicular lymphoma grade I, intra-abdominal lymph nodes: Secondary | ICD-10-CM

## 2015-04-30 DIAGNOSIS — Z5112 Encounter for antineoplastic immunotherapy: Secondary | ICD-10-CM | POA: Diagnosis not present

## 2015-04-30 DIAGNOSIS — M545 Low back pain: Secondary | ICD-10-CM

## 2015-04-30 LAB — COMPREHENSIVE METABOLIC PANEL
ALBUMIN: 3.6 g/dL (ref 3.5–5.0)
ALK PHOS: 100 U/L (ref 40–150)
ALT: 50 U/L (ref 0–55)
ANION GAP: 7 meq/L (ref 3–11)
AST: 51 U/L — ABNORMAL HIGH (ref 5–34)
BILIRUBIN TOTAL: 0.67 mg/dL (ref 0.20–1.20)
BUN: 18.1 mg/dL (ref 7.0–26.0)
CALCIUM: 9.6 mg/dL (ref 8.4–10.4)
CO2: 28 mEq/L (ref 22–29)
Chloride: 110 mEq/L — ABNORMAL HIGH (ref 98–109)
Creatinine: 0.7 mg/dL (ref 0.6–1.1)
EGFR: 79 mL/min/{1.73_m2} — AB (ref >90)
Glucose: 102 mg/dl (ref 70–140)
Potassium: 4.3 mEq/L (ref 3.5–5.1)
Sodium: 145 mEq/L (ref 136–145)
TOTAL PROTEIN: 6.4 g/dL (ref 6.4–8.3)

## 2015-04-30 LAB — CBC WITH DIFFERENTIAL/PLATELET
BASO%: 1.2 % (ref 0.0–2.0)
Basophils Absolute: 0.1 10*3/uL (ref 0.0–0.1)
EOS ABS: 0.5 10*3/uL (ref 0.0–0.5)
EOS%: 7.8 % — ABNORMAL HIGH (ref 0.0–7.0)
HCT: 43.1 % (ref 34.8–46.6)
HEMOGLOBIN: 14.4 g/dL (ref 11.6–15.9)
LYMPH%: 8.8 % — AB (ref 14.0–49.7)
MCH: 32.7 pg (ref 25.1–34.0)
MCHC: 33.4 g/dL (ref 31.5–36.0)
MCV: 98 fL (ref 79.5–101.0)
MONO#: 0.8 10*3/uL (ref 0.1–0.9)
MONO%: 12.5 % (ref 0.0–14.0)
NEUT%: 69.7 % (ref 38.4–76.8)
NEUTROS ABS: 4.5 10*3/uL (ref 1.5–6.5)
PLATELETS: 154 10*3/uL (ref 145–400)
RBC: 4.4 10*6/uL (ref 3.70–5.45)
RDW: 13.1 % (ref 11.2–14.5)
WBC: 6.5 10*3/uL (ref 3.9–10.3)
lymph#: 0.6 10*3/uL — ABNORMAL LOW (ref 0.9–3.3)

## 2015-04-30 MED ORDER — SODIUM CHLORIDE 0.9 % IJ SOLN
10.0000 mL | INTRAMUSCULAR | Status: DC | PRN
  Administered 2015-04-30: 10 mL
  Filled 2015-04-30: qty 10

## 2015-04-30 MED ORDER — HYDROCODONE-ACETAMINOPHEN 10-325 MG PO TABS
1.0000 | ORAL_TABLET | Freq: Four times a day (QID) | ORAL | Status: DC | PRN
Start: 2015-04-30 — End: 2016-06-21

## 2015-04-30 MED ORDER — HEPARIN SOD (PORK) LOCK FLUSH 100 UNIT/ML IV SOLN
500.0000 [IU] | Freq: Once | INTRAVENOUS | Status: AC | PRN
  Administered 2015-04-30: 500 [IU]
  Filled 2015-04-30: qty 5

## 2015-04-30 MED ORDER — ACETAMINOPHEN 325 MG PO TABS
ORAL_TABLET | ORAL | Status: AC
  Filled 2015-04-30: qty 2

## 2015-04-30 MED ORDER — DIPHENHYDRAMINE HCL 25 MG PO CAPS
50.0000 mg | ORAL_CAPSULE | Freq: Once | ORAL | Status: AC
  Administered 2015-04-30: 50 mg via ORAL

## 2015-04-30 MED ORDER — DIPHENHYDRAMINE HCL 25 MG PO CAPS
ORAL_CAPSULE | ORAL | Status: AC
  Filled 2015-04-30: qty 2

## 2015-04-30 MED ORDER — ACETAMINOPHEN 325 MG PO TABS
650.0000 mg | ORAL_TABLET | Freq: Once | ORAL | Status: AC
  Administered 2015-04-30: 650 mg via ORAL

## 2015-04-30 MED ORDER — SODIUM CHLORIDE 0.9 % IV SOLN
Freq: Once | INTRAVENOUS | Status: AC
  Administered 2015-04-30: 11:00:00 via INTRAVENOUS

## 2015-04-30 MED ORDER — SODIUM CHLORIDE 0.9 % IV SOLN
375.0000 mg/m2 | Freq: Once | INTRAVENOUS | Status: AC
  Administered 2015-04-30: 800 mg via INTRAVENOUS
  Filled 2015-04-30: qty 70

## 2015-04-30 NOTE — Progress Notes (Signed)
Patient Care Team: Marletta Lor, MD as PCP - General  DIAGNOSIS: Breast cancer of upper-outer quadrant of right female breast [facility])   Staging form: Breast, AJCC 7th Edition     Clinical: Tis (DCIS), NX, cM0 - Unsigned   SUMMARY OF ONCOLOGIC HISTORY:   Breast cancer of upper-outer quadrant of right female breast ([place])   04/14/2011 Surgery Right mastectomy: 2 foci of invasive ductal carcinoma 0.2 cm, grade 1 with background extensive DCIS, 0/5 lymph nodes negative,focus 1: ER 94%, PR 100%, Ki-67 5%, HER-2 negative: Focus to ER 100%BR 6% Ki-67 29% HER-2 negative   05/02/2011 -  Anti-estrogen oral therapy letrozole 2.5 mg daily    Follicular 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 ([name])   07/24/2014 Initial Diagnosis Retroperitoneal mass biopsy left side: Follicular lymphoma low-grade grade 1-2, flow cytometry positive for CD10, 20, 3, 10, 5, BCL-2, BCl-6, CD21, Ki-67 less than 10%   08/13/2014 -  Chemotherapy Bendamustine and Rituxan day 1, 2 every 4 weeks 6 cycles followed by Rituxan maintenance every 2 months 2 years   11/03/2014 Imaging midpoint of chemotherapy: Moderate improvement in the periaortic lymphadenopathy without complete resolution   01/16/2015 PET scan  interval decrease in size and metabolic activity of the large left retroperitoneal mass , no increase in activity, size 10 mm decreased from 48 mm  SUV 1.9 , no additional hypermetabolic activity    CHIEF COMPLIANT: Follow-up on Rituxan maintenance  INTERVAL HISTORY: Selena [name] is a subcentimeter with above-mentioned history of follicular lymphoma currently on Rituxan maintenance. She has been tolerating it fairly well. She has a lot of back pain and arthritis. She has been using Percocets as needed. She would like a refill of that today. I discussed with her about using nonsteroidal anti-inflammatory drugs instead. Her goal is to wean her off narcotics. She reports that she gets 2-3 normal bowel movements  daily. Denies any constipation.  REVIEW OF SYSTEMS:   Constitutional: Denies fevers, chills or abnormal weight loss Eyes: Denies blurriness of vision Ears, nose, mouth, throat, and face: Denies mucositis or sore throat Respiratory: Denies cough, dyspnea or wheezes Cardiovascular: Denies palpitation, chest discomfort Gastrointestinal:  Denies nausea, heartburn or change in bowel habits Skin: Denies abnormal skin rashes Lymphatics: Denies new lymphadenopathy or easy bruising Neurological:Denies numbness, tingling or new weaknesses Behavioral/Psych: Mood is stable, no new changes  Extremities: No lower extremity edema Breast:  denies any pain or lumps or nodules in either breasts All other systems were reviewed with the patient and are negative.  I have reviewed the past medical history, past surgical history, social history and family history with the patient and they are unchanged from previous note.  ALLERGIES:  is allergic to codeine and codeine phosphate.  MEDICATIONS:  Current Outpatient Prescriptions  Medication Sig Dispense Refill  . cholecalciferol (VITAMIN D) 1000 UNITS tablet Take 1,000 Units by mouth daily.    . diclofenac (VOLTAREN) 75 MG EC tablet TAKE 1 TABLET BY MOUTH TWICE DAILY 180 tablet 3  . HYDROcodone-acetaminophen (NORCO) 10-325 MG tablet Take 1 tablet by mouth every 6 (six) hours as needed. 60 tablet 0  . letrozole (FEMARA) 2.5 MG tablet Take 1 tablet (2.5 mg total) by mouth daily. 90 tablet 3  . lidocaine-prilocaine (EMLA) cream APP EXT AA ONCE UTD  3  . meclizine (ANTIVERT) 25 MG tablet Take 1 tablet (25 mg total) by mouth every 6 (six) hours as needed for dizziness. 60 tablet 0  . UNABLE TO FIND Rx:  L8000- Post Surgical Bras (Quantity: 6) F6812- Silicone Breast Prosthesis (Quantity: 1) Dx: 174.9; Right Mastectomy 1 each 0   No current facility-administered medications for this visit.    PHYSICAL EXAMINATION: ECOG PERFORMANCE STATUS: 1 - Symptomatic but  completely ambulatory  Filed Vitals:   04/30/15 1014  BP: 161/68  Pulse: 71  Temp: 97.8 F (36.6 C)  Resp: 18   Filed Weights   04/30/15 1014  Weight: 203 lb 3.2 oz (92.171 kg)    GENERAL:alert, no distress and comfortable SKIN: skin color, texture, turgor are normal, no rashes or significant lesions EYES: normal, Conjunctiva are pink and non-injected, sclera clear OROPHARYNX:no exudate, no erythema and lips, buccal mucosa, and tongue normal  NECK: supple, thyroid normal size, non-tender, without nodularity LYMPH:  no palpable lymphadenopathy in the cervical, axillary or inguinal LUNGS: clear to auscultation and percussion with normal breathing effort HEART: regular rate & rhythm and no murmurs and no lower extremity edema ABDOMEN:abdomen soft, non-tender and normal bowel sounds MUSCULOSKELETAL:no cyanosis of digits and no clubbing  NEURO: alert & oriented x 3 with fluent speech, no focal motor/sensory deficits EXTREMITIES: No lower extremity edema  LABORATORY DATA:  I have reviewed the data as listed   Chemistry      Component Value Date/Time   NA 145 04/30/2015 0959   NA 141 08/12/2013 0923   K 4.3 04/30/2015 0959   K 4.2 08/12/2013 0923   CL 106 08/12/2013 0923   CL 107 04/05/2012 1253   CO2 28 04/30/2015 0959   CO2 27 08/12/2013 0923   BUN 18.1 04/30/2015 0959   BUN 20 08/12/2013 0923   CREATININE 0.7 04/30/2015 0959   CREATININE 0.6 08/12/2013 0923      Component Value Date/Time   CALCIUM 9.6 04/30/2015 0959   CALCIUM 9.8 08/12/2013 0923   ALKPHOS 100 04/30/2015 0959   ALKPHOS 106 08/12/2013 0923   AST 51* 04/30/2015 0959   AST 42* 08/12/2013 0923   ALT 50 04/30/2015 0959   ALT 52* 08/12/2013 0923   BILITOT 0.67 04/30/2015 0959   BILITOT 0.7 08/12/2013 0923       Lab Results  Component Value Date   WBC 6.5 04/30/2015   HGB 14.4 04/30/2015   HCT 43.1 04/30/2015   MCV 98.0 04/30/2015   PLT 154 04/30/2015   NEUTROABS 4.5 04/30/2015      ASSESSMENT & PLAN:  Follicular 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 Retroperitoneal mass biopsy left side 75/17/0017: Follicular lymphoma low-grade grade 1-2, flow cytometry positive for CD10, 20, 3, 10, 5, BCL-2, BCl-6, CD21, Ki-67 less than 10%, stage IB (8X 4 cm mass)  Treatment plan:systemic chemotherapy with bendamustine and Rituxan 6 cycles ( From 08/13/2014 to 01/01/2015) followed by Rituxan maintenance every 2 months for 2 years  PET/CT scan 01/16/2015: Interval decrease in size and metabolic activity of the large left retroperitoneal mass , no increase in activity, size 10 mm decreased from 48 mm SUV 1.9 , no additional hypermetabolic activity  Plan: 1. Continue maintenance therapy with Rituxan every 2 months for 2 years Start 02/26/2015. 2. Return to clinic with Rituxan maintenance #3 in June 2017   Breast cancer of upper-outer quadrant of right female breast ([place]) Right breast cancer: Right mastectomy 04/14/2011: 2 foci of invasive ductal carcinoma 0.2 cm, grade 1 with background extensive DCIS, 0/5 lymph nodes negative,focus 1: ER 94%, PR 100%, Ki-67 5%, HER-2 negative: Focus to ER 100%BR 6% Ki-67 29% HER-2 negative  Current treatment: continue with letrozole until  April 2018    No orders of the defined types were placed in this encounter.   The patient has a good understanding of the overall plan. she agrees with it. she will call with any problems that may develop before the next visit here.   Rulon Eisenmenger, MD 04/30/2015

## 2015-04-30 NOTE — Assessment & Plan Note (Addendum)
Right breast cancer: Right mastectomy 04/14/2011: 2 foci of invasive ductal carcinoma 0.2 cm, grade 1 with background extensive DCIS, 0/5 lymph nodes negative,focus 1: ER 94%, PR 100%, Ki-67 5%, HER-2 negative: Focus to ER 100%BR 6% Ki-67 29% HER-2 negative  Current treatment: continue with letrozole until April 2018 

## 2015-04-30 NOTE — Patient Instructions (Signed)
[facility] Discharge Instructions for Patients Receiving Chemotherapy  Today you received the following chemotherapy agents: Rituxan   To help prevent nausea and vomiting after your treatment, we encourage you to take your nausea medication as directed.    If you develop nausea and vomiting that is not controlled by your nausea medication, call the clinic.   BELOW ARE SYMPTOMS THAT SHOULD BE REPORTED IMMEDIATELY:  *FEVER GREATER THAN 100.5 F  *CHILLS WITH OR WITHOUT FEVER  NAUSEA AND VOMITING THAT IS NOT CONTROLLED WITH YOUR NAUSEA MEDICATION  *UNUSUAL SHORTNESS OF BREATH  *UNUSUAL BRUISING OR BLEEDING  TENDERNESS IN MOUTH AND THROAT WITH OR WITHOUT PRESENCE OF ULCERS  *URINARY PROBLEMS  *BOWEL PROBLEMS  UNUSUAL RASH Items with * indicate a potential emergency and should be followed up as soon as possible.  Feel free to call the clinic you have any questions or concerns. The clinic phone number is [phone].  Please show the CHEMO ALERT CARD at check-in to the Emergency Department and triage nurse.   

## 2015-04-30 NOTE — Assessment & Plan Note (Addendum)
Retroperitoneal mass biopsy left side 53/01/402: Follicular lymphoma low-grade grade 1-2, flow cytometry positive for CD10, 20, 3, 10, 5, BCL-2, BCl-6, CD21, Ki-67 less than 10%, stage IB (8X 4 cm mass)  Treatment plan:systemic chemotherapy with bendamustine and Rituxan 6 cycles ( From 08/13/2014 to 01/01/2015) followed by Rituxan maintenance every 2 months for 2 years  PET/CT scan 01/16/2015: Interval decrease in size and metabolic activity of the large left retroperitoneal mass , no increase in activity, size 10 mm decreased from 48 mm SUV 1.9 , no additional hypermetabolic activity  Plan: 1. Continue maintenance therapy with Rituxan every 2 months for 2 years Start 02/26/2015. 2. Return to clinic with Rituxan maintenance #3 in June 2017

## 2015-04-30 NOTE — Progress Notes (Signed)
Unable to get in to exam room prior to MD.  No assessment performed.  

## 2015-04-30 NOTE — Telephone Encounter (Signed)
appt made and avs printed °

## 2015-05-01 ENCOUNTER — Other Ambulatory Visit: Payer: Medicare Other

## 2015-05-01 ENCOUNTER — Ambulatory Visit: Payer: Medicare Other | Admitting: Hematology and Oncology

## 2015-05-05 ENCOUNTER — Encounter: Payer: Self-pay | Admitting: Internal Medicine

## 2015-05-05 ENCOUNTER — Ambulatory Visit (INDEPENDENT_AMBULATORY_CARE_PROVIDER_SITE_OTHER): Payer: Medicare Other | Admitting: Internal Medicine

## 2015-05-05 VITALS — BP 150/82 | HR 84 | Temp 98.3°F | Resp 20 | Ht 67.0 in | Wt 203.0 lb

## 2015-05-05 DIAGNOSIS — G4700 Insomnia, unspecified: Secondary | ICD-10-CM | POA: Diagnosis not present

## 2015-05-05 DIAGNOSIS — R29818 Other symptoms and signs involving the nervous system: Secondary | ICD-10-CM

## 2015-05-05 DIAGNOSIS — R222 Localized swelling, mass and lump, trunk: Secondary | ICD-10-CM

## 2015-05-05 NOTE — Progress Notes (Signed)
Pre visit review using our clinic review tool, if applicable. No additional management support is needed unless otherwise documented below in the visit note. 

## 2015-05-05 NOTE — Progress Notes (Signed)
Subjective:    Patient ID: Selena [name], female    DOB: 1935-12-07, 80 y.o.   MRN: [id number]  HPI  80 year old patient who is followed closely by oncology and is being treated for follicular lymphoma involving intra-abdominal lymph nodes.  She was seen last week and laboratory studies.  5 days ago were unremarkable For the past 7 days she has had postprandial lower abdominal pain.  This occurs usually 1 hour after a meal.  Pain radiates to the lower back area and then is relieved with a bowel movement.  She has had 4 episodes today.  She describes weakness and poor appetite.  No nausea or vomiting.  Denies any fever She was treated with antibiotics approximately 2 months ago for an upper respiratory tract infection.  She states her bowel movements are fairly normal and not loose  Past Medical History  Diagnosis Date  . BACK PAIN, UPPER 07/09/2008  . DIVERTICULOSIS, COLON 12/26/2006  . LIVER FUNCTION TESTS, ABNORMAL, HX OF 12/26/2007  . Memory loss 05/16/2007  . VERTIGO 09/07/2009  . Cataract   . Goiter     multi-nodular  . Hx of colonoscopy 2009  . Wears glasses   . Wears hearing aid     bilateral  . Wears dentures   . Complication of anesthesia   . PONV (postoperative nausea and vomiting)   . Atrial fibrillation ([place]) 01/01/2007  . Vertigo     hx of;had Phenergan prn   . OSTEOARTHRITIS 12/26/2006    takes Diclofenac daily but has stopped for surgery  . Bruises easily   . Diverticulitis   . Skin cancer   . Breast cancer, stage 1 ([place])   . Follicular non-Hodgkin's lymphoma ([place])   . Anemia     PMH    Social History   Social History  . Marital Status: Widowed    Spouse Name: N/A  . Number of Children: N/A  . Years of Education: N/A   Occupational History  . Not on file.   Social History Main Topics  . Smoking status: Never Smoker   . Smokeless tobacco: Never Used  . Alcohol Use: No  . Drug Use: No  . Sexual Activity: Yes    Birth Control/ Protection: Surgical    Other Topics Concern  . Not on file   Social History Narrative    Past Surgical History  Procedure Laterality Date  . Abdominal hysterectomy    . Rotator cuff repair  1999    right  . Knee surgery  2004/2010    arthroscopic/ bilateral knee replacements  . Cataract extraction w/ intraocular lens  implant, bilateral  2010    bilateral  . Colonoscopy    . Appendectomy    . Ablation of dysrhythmic focus  2009  . Tmi  1998  . Colonoscopy    . Breast biopsy  2013  . Breast reconstruction  04/14/2011    Procedure: BREAST RECONSTRUCTION;  Surgeon: Crissie Reese, MD;  Location: [facility];  Service: Plastics;  Laterality: Right;  Placement of Right Breast Tissue Expander with  use of Flex HD for Breast Reconstruction  . Breast surgery      right sm, snbx  . Tonsillectomy    . Portacath placement Right 08/11/2014    Procedure: INSERTION PORT-A-CATH WITH ULTRASOUND;  Surgeon: Rolm Bookbinder, MD;  Location: [facility];  Service: General;  Laterality: Right;    Family History  Problem Relation Age of Onset  . Colon cancer Mother   .  Cancer Mother     colon  . Colon cancer Maternal Aunt   . Cancer Maternal Uncle   . Prostate cancer Maternal Uncle   . Prostate cancer Maternal Uncle   . Other Maternal Uncle   . Anesthesia problems Neg Hx   . Hypotension Neg Hx   . Malignant hyperthermia Neg Hx   . Pseudochol deficiency Neg Hx   . Cancer Sister     kidney  . Cancer Brother     lymph node    Allergies  Allergen Reactions  . Codeine Other (See Comments)    Hallucinations   . Codeine Phosphate Other (See Comments)    Hallucinations    Current Outpatient Prescriptions on File Prior to Visit  Medication Sig Dispense Refill  . cholecalciferol (VITAMIN D) 1000 UNITS tablet Take 1,000 Units by mouth daily.    . diclofenac (VOLTAREN) 75 MG EC tablet TAKE 1 TABLET BY MOUTH TWICE DAILY 180 tablet 3  . HYDROcodone-acetaminophen (NORCO) 10-325 MG tablet Take 1 tablet by mouth every 6 (six)  hours as needed. 60 tablet 0  . letrozole (FEMARA) 2.5 MG tablet Take 1 tablet (2.5 mg total) by mouth daily. 90 tablet 3  . lidocaine-prilocaine (EMLA) cream APP EXT AA ONCE UTD  3  . meclizine (ANTIVERT) 25 MG tablet Take 1 tablet (25 mg total) by mouth every 6 (six) hours as needed for dizziness. 60 tablet 0  . UNABLE TO FIND Rx: L8000- Post Surgical Bras (Quantity: 6) [id number]- Silicone Breast Prosthesis (Quantity: 1) Dx: 174.9; Right Mastectomy 1 each 0   No current facility-administered medications on file prior to visit.    BP 150/82 mmHg  Pulse 84  Temp(Src) 98.3 F (36.8 C) (Oral)  Resp 20  Ht 5\' 7"  (1.702 m)  Wt 203 lb (92.08 kg)  BMI 31.79 kg/m2  SpO2 98%     Review of Systems  Constitutional: Positive for activity change, appetite change and fatigue.  HENT: Negative for congestion, dental problem, hearing loss, rhinorrhea, sinus pressure, sore throat and tinnitus.   Eyes: Negative for pain, discharge and visual disturbance.  Respiratory: Negative for cough and shortness of breath.   Cardiovascular: Negative for chest pain, palpitations and leg swelling.  Gastrointestinal: Positive for abdominal pain. Negative for nausea, vomiting, diarrhea, constipation, blood in stool and abdominal distention.  Genitourinary: Negative for dysuria, urgency, frequency, hematuria, flank pain, vaginal bleeding, vaginal discharge, difficulty urinating, vaginal pain and pelvic pain.  Musculoskeletal: Negative for joint swelling, arthralgias and gait problem.  Skin: Negative for rash.  Neurological: Positive for weakness. Negative for dizziness, syncope, speech difficulty, numbness and headaches.  Hematological: Negative for adenopathy.  Psychiatric/Behavioral: Negative for behavioral problems, dysphoric mood and agitation. The patient is not nervous/anxious.        Objective:   Physical Exam  Constitutional: She is oriented to person, place, and time. She appears well-developed and  well-nourished. No distress.  Afebrile Normal blood pressure No tachycardia Appears unwell, but in no acute distress  HENT:  Head: Normocephalic.  Right Ear: External ear normal.  Left Ear: External ear normal.  Mouth/Throat: Oropharynx is clear and moist.  Eyes: Conjunctivae and EOM are normal. Pupils are equal, round, and reactive to light.  Neck: Normal range of motion. Neck supple. No thyromegaly present.  Cardiovascular: Normal rate, regular rhythm, normal heart sounds and intact distal pulses.   Pulmonary/Chest: Effort normal and breath sounds normal.  Abdominal: Soft. Bowel sounds are normal. She exhibits no mass. There is no tenderness.  There is no rebound and no guarding.  Obese, soft and nontender.  Active bowel sounds    Musculoskeletal: Normal range of motion.  Lymphadenopathy:    She has no cervical adenopathy.  Neurological: She is alert and oriented to person, place, and time.  Skin: Skin is warm and dry. No rash noted.  Psychiatric: She has a normal mood and affect. Her behavior is normal.          Assessment & Plan:   Abdominal pain/change in bowel habits.  Probably viral syndrome.  Will place on a low residue diet and a probiotic and observe Hypertension, stable

## 2015-05-05 NOTE — Patient Instructions (Addendum)
Try a low fiber diet for a few days and then advance as tolerated  Align 1 tablet daily for 7-14 days  Call or return to clinic prn if these symptoms worsen or fail to improve as anticipated.  Low-Fiber Diet Fiber is found in fruits, vegetables, and whole grains. A low-fiber diet restricts fibrous foods that are not digested in the small intestine. A diet containing about 10-15 grams of fiber per day is considered low fiber. Low-fiber diets may be used to:  Promote healing and rest the bowel during intestinal flare-ups.  Prevent blockage of a partially obstructed or narrowed gastrointestinal tract.  Reduce fecal weight and volume.  Slow the movement of feces. You may be on a low-fiber diet as a transitional diet following surgery, after an injury (trauma), or because of a short (acute) or lifelong (chronic) illness. Your health care provider will determine the length of time you need to stay on this diet.  WHAT DO I NEED TO KNOW ABOUT A LOW-FIBER DIET? Always check the fiber content on the packaging's Nutrition Facts label, especially on foods from the grains list. Ask your dietitian if you have questions about specific foods that are related to your condition, especially if the food is not listed below. In general, a low-fiber food will have less than 2 g of fiber. WHAT FOODS CAN I EAT? Grains All breads and crackers made with white flour. Sweet rolls, doughnuts, waffles, pancakes, [place] toast, bagels. Pretzels, Melba toast, zwieback. Well-cooked cereals, such as cornmeal, farina, or cream cereals. Dry cereals that do not contain whole grains, fruit, or nuts, such as refined corn, wheat, rice, and oat cereals. Potatoes prepared any way without skins, plain pastas and noodles, refined white rice. Use white flour for baking and making sauces. Use allowed list of grains for casseroles, dumplings, and puddings.  Vegetables Strained tomato and vegetable juices. Fresh lettuce, cucumber, spinach.  Well-cooked (no skin or pulp) or canned vegetables, such as asparagus, bean sprouts, beets, carrots, green beans, mushrooms, potatoes, pumpkin, spinach, yellow squash, tomato sauce/puree, turnips, yams, and zucchini. Keep servings limited to  cup.  Fruits All fruit juices except prune juice. Cooked or canned fruits without skin and seeds, such as applesauce, apricots, cherries, fruit cocktail, grapefruit, grapes, mandarin oranges, melons, peaches, pears, pineapple, and plums. Fresh fruits without skin, such as apricots, avocados, bananas, melons, pineapple, nectarines, and peaches. Keep servings limited to  cup or 1 piece.  Meat and Other Protein Sources Ground or well-cooked tender beef, ham, veal, lamb, pork, or poultry. Eggs, plain cheese. Fish, oysters, shrimp, lobster, and other seafood. Liver, organ meats. Smooth nut butters. Dairy All milk products and alternative dairy substitutes, such as soy, rice, almond, and coconut, not containing added whole nuts, seeds, or added fruit. Beverages Decaf coffee, fruit, and vegetable juices or smoothies (small amounts, with no pulp or skins, and with fruits from allowed list), sports drinks, herbal tea. Condiments Ketchup, mustard, vinegar, cream sauce, cheese sauce, cocoa powder. Spices in moderation, such as allspice, basil, bay leaves, celery powder or leaves, cinnamon, cumin powder, curry powder, ginger, mace, marjoram, onion or garlic powder, oregano, paprika, parsley flakes, ground pepper, rosemary, sage, savory, tarragon, thyme, and turmeric. Sweets and Desserts Plain cakes and cookies, pie made with allowed fruit, pudding, custard, cream pie. Gelatin, fruit, ice, sherbet, frozen ice pops. Ice cream, ice milk without nuts. Plain hard candy, honey, jelly, molasses, syrup, sugar, chocolate syrup, gumdrops, marshmallows. Limit overall sugar intake.  Fats and Oil Margarine, butter, cream,  mayonnaise, salad oils, plain salad dressings made from allowed  foods. Choose healthy fats such as olive oil, canola oil, and omega-3 fatty acids (such as found in salmon or tuna) when possible.  Other Bouillon, broth, or cream soups made from allowed foods. Any strained soup. Casseroles or mixed dishes made with allowed foods. The items listed above may not be a complete list of recommended foods or beverages. Contact your dietitian for more options.  WHAT FOODS ARE NOT RECOMMENDED? Grains All whole wheat and whole grain breads and crackers. Multigrains, rye, bran seeds, nuts, or coconut. Cereals containing whole grains, multigrains, bran, coconut, nuts, raisins. Cooked or dry oatmeal, steel-cut oats. Coarse wheat cereals, granola. Cereals advertised as high fiber. Potato skins. Whole grain pasta, wild or brown rice. Popcorn. Coconut flour. Bran, buckwheat, corn bread, multigrains, rye, wheat germ.  Vegetables Fresh, cooked or canned vegetables, such as artichokes, asparagus, beet greens, broccoli, Brussels sprouts, cabbage, celery, cauliflower, corn, eggplant, kale, legumes or beans, okra, peas, and tomatoes. Avoid large servings of any vegetables, especially raw vegetables.  Fruits Fresh fruits, such as apples with or without skin, berries, cherries, figs, grapes, grapefruit, guavas, kiwis, mangoes, oranges, papayas, pears, persimmons, pineapple, and pomegranate. Prune juice and juices with pulp, stewed or dried prunes. Dried fruits, dates, raisins. Fruit seeds or skins. Avoid large servings of all fresh fruits. Meats and Other Protein Sources Tough, fibrous meats with gristle. Chunky nut butter. Cheese made with seeds, nuts, or other foods not recommended. Nuts, seeds, legumes (beans, including baked beans), dried peas, beans, lentils.  Dairy Yogurt or cheese that contains nuts, seeds, or added fruit.  Beverages Fruit juices with high pulp, prune juice. Caffeinated coffee and teas.  Condiments Coconut, maple syrup, pickles, olives. Sweets and  Desserts Desserts, cookies, or candies that contain nuts or coconut, chunky peanut butter, dried fruits. Jams, preserves with seeds, marmalade. Large amounts of sugar and sweets. Any other dessert made with fruits from the not recommended list.  Other Soups made from vegetables that are not recommended or that contain other foods not recommended.  The items listed above may not be a complete list of foods and beverages to avoid. Contact your dietitian for more information.   This information is not intended to replace advice given to you by your health care provider. Make sure you discuss any questions you have with your health care provider.   Document Released: 07/02/2001 Document Revised: 01/15/2013 Document Reviewed: 12/03/2012 Elsevier Interactive Patient Education [employer].

## 2015-05-21 ENCOUNTER — Encounter: Payer: Self-pay | Admitting: Gastroenterology

## 2015-06-29 ENCOUNTER — Other Ambulatory Visit: Payer: Self-pay | Admitting: Hematology and Oncology

## 2015-06-29 ENCOUNTER — Ambulatory Visit
Admission: RE | Admit: 2015-06-29 | Discharge: 2015-06-29 | Disposition: A | Discharge status: Home / Self Care | Payer: Medicare Other | Source: Ambulatory Visit

## 2015-06-29 DIAGNOSIS — Z1231 Encounter for screening mammogram for malignant neoplasm of breast: Secondary | ICD-10-CM

## 2015-07-01 ENCOUNTER — Telehealth: Payer: Self-pay | Admitting: Hematology and Oncology

## 2015-07-01 ENCOUNTER — Other Ambulatory Visit (HOSPITAL_BASED_OUTPATIENT_CLINIC_OR_DEPARTMENT_OTHER): Payer: Medicare Other

## 2015-07-01 ENCOUNTER — Encounter: Payer: Self-pay | Admitting: Hematology and Oncology

## 2015-07-01 ENCOUNTER — Ambulatory Visit (HOSPITAL_BASED_OUTPATIENT_CLINIC_OR_DEPARTMENT_OTHER): Payer: Medicare Other

## 2015-07-01 ENCOUNTER — Ambulatory Visit (HOSPITAL_BASED_OUTPATIENT_CLINIC_OR_DEPARTMENT_OTHER): Payer: Medicare Other | Admitting: Hematology and Oncology

## 2015-07-01 VITALS — BP 160/76 | HR 76 | Temp 98.3°F | Resp 18 | Ht 67.0 in | Wt 202.3 lb

## 2015-07-01 VITALS — BP 134/89 | HR 72 | Temp 97.8°F | Resp 16

## 2015-07-01 DIAGNOSIS — C50411 Malignant neoplasm of upper-outer quadrant of right female breast: Secondary | ICD-10-CM

## 2015-07-01 DIAGNOSIS — Z5112 Encounter for antineoplastic immunotherapy: Secondary | ICD-10-CM | POA: Diagnosis not present

## 2015-07-01 DIAGNOSIS — C8203 Follicular lymphoma grade I, intra-abdominal lymph nodes: Secondary | ICD-10-CM | POA: Diagnosis not present

## 2015-07-01 LAB — COMPREHENSIVE METABOLIC PANEL
ALT: 57 U/L — ABNORMAL HIGH (ref 0–55)
ANION GAP: 8 meq/L (ref 3–11)
AST: 45 U/L — ABNORMAL HIGH (ref 5–34)
Albumin: 3.8 g/dL (ref 3.5–5.0)
Alkaline Phosphatase: 106 U/L (ref 40–150)
BUN: 18.7 mg/dL (ref 7.0–26.0)
CALCIUM: 9.9 mg/dL (ref 8.4–10.4)
CHLORIDE: 109 meq/L (ref 98–109)
CO2: 26 mEq/L (ref 22–29)
Creatinine: 0.8 mg/dL (ref 0.6–1.1)
EGFR: 77 mL/min/{1.73_m2} — ABNORMAL LOW (ref >90)
Glucose: 89 mg/dl (ref 70–140)
POTASSIUM: 4.3 meq/L (ref 3.5–5.1)
Sodium: 143 mEq/L (ref 136–145)
Total Bilirubin: 0.62 mg/dL (ref 0.20–1.20)
Total Protein: 6.6 g/dL (ref 6.4–8.3)

## 2015-07-01 LAB — CBC WITH DIFFERENTIAL/PLATELET
BASO%: 1.1 % (ref 0.0–2.0)
BASOS ABS: 0.1 10*3/uL (ref 0.0–0.1)
EOS%: 7.6 % — AB (ref 0.0–7.0)
Eosinophils Absolute: 0.5 10*3/uL (ref 0.0–0.5)
HEMATOCRIT: 43.7 % (ref 34.8–46.6)
HGB: 14.5 g/dL (ref 11.6–15.9)
LYMPH#: 0.5 10*3/uL — AB (ref 0.9–3.3)
LYMPH%: 8.3 % — ABNORMAL LOW (ref 14.0–49.7)
MCH: 32.4 pg (ref 25.1–34.0)
MCHC: 33.3 g/dL (ref 31.5–36.0)
MCV: 97.4 fL (ref 79.5–101.0)
MONO#: 0.9 10*3/uL (ref 0.1–0.9)
MONO%: 13.4 % (ref 0.0–14.0)
NEUT#: 4.6 10*3/uL (ref 1.5–6.5)
NEUT%: 69.6 % (ref 38.4–76.8)
PLATELETS: 149 10*3/uL (ref 145–400)
RBC: 4.48 10*6/uL (ref 3.70–5.45)
RDW: 13 % (ref 11.2–14.5)
WBC: 6.7 10*3/uL (ref 3.9–10.3)

## 2015-07-01 MED ORDER — ACETAMINOPHEN 325 MG PO TABS
ORAL_TABLET | ORAL | Status: AC
  Filled 2015-07-01: qty 2

## 2015-07-01 MED ORDER — RITUXIMAB CHEMO INJECTION 500 MG/50ML
375.0000 mg/m2 | Freq: Once | INTRAVENOUS | Status: AC
  Administered 2015-07-01: 800 mg via INTRAVENOUS
  Filled 2015-07-01: qty 70

## 2015-07-01 MED ORDER — DIPHENHYDRAMINE HCL 25 MG PO CAPS
50.0000 mg | ORAL_CAPSULE | Freq: Once | ORAL | Status: AC
  Administered 2015-07-01: 50 mg via ORAL

## 2015-07-01 MED ORDER — SODIUM CHLORIDE 0.9 % IJ SOLN
10.0000 mL | INTRAMUSCULAR | Status: DC | PRN
  Administered 2015-07-01: 10 mL
  Filled 2015-07-01: qty 10

## 2015-07-01 MED ORDER — DIPHENHYDRAMINE HCL 25 MG PO CAPS
ORAL_CAPSULE | ORAL | Status: AC
  Filled 2015-07-01: qty 2

## 2015-07-01 MED ORDER — HEPARIN SOD (PORK) LOCK FLUSH 100 UNIT/ML IV SOLN
500.0000 [IU] | Freq: Once | INTRAVENOUS | Status: AC | PRN
  Administered 2015-07-01: 500 [IU]
  Filled 2015-07-01: qty 5

## 2015-07-01 MED ORDER — ACETAMINOPHEN 325 MG PO TABS
650.0000 mg | ORAL_TABLET | Freq: Once | ORAL | Status: AC
Start: 2015-07-01 — End: 2015-07-01
  Administered 2015-07-01: 650 mg via ORAL

## 2015-07-01 MED ORDER — SODIUM CHLORIDE 0.9 % IV SOLN
Freq: Once | INTRAVENOUS | Status: AC
  Administered 2015-07-01: 10:00:00 via INTRAVENOUS

## 2015-07-01 NOTE — Progress Notes (Signed)
Patient Care Team: Marletta Lor, MD as PCP - General  DIAGNOSIS: Breast cancer of upper-outer quadrant of right female breast [facility])   Staging form: Breast, AJCC 7th Edition     Clinical: Tis (DCIS), NX, cM0 - Unsigned   SUMMARY OF ONCOLOGIC HISTORY:   Breast cancer of upper-outer quadrant of right female breast ([place])   04/14/2011 Surgery Right mastectomy: 2 foci of invasive ductal carcinoma 0.2 cm, grade 1 with background extensive DCIS, 0/5 lymph nodes negative,focus 1: ER 94%, PR 100%, Ki-67 5%, HER-2 negative: Focus to ER 100%BR 6% Ki-67 29% HER-2 negative   05/02/2011 -  Anti-estrogen oral therapy letrozole 2.5 mg daily    Follicular 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 ([name])   07/24/2014 Initial Diagnosis Retroperitoneal mass biopsy left side: Follicular lymphoma low-grade grade 1-2, flow cytometry positive for CD10, 20, 3, 10, 5, BCL-2, BCl-6, CD21, Ki-67 less than 10%   08/13/2014 -  Chemotherapy Bendamustine and Rituxan day 1, 2 every 4 weeks 6 cycles followed by Rituxan maintenance every 2 months 2 years   11/03/2014 Imaging midpoint of chemotherapy: Moderate improvement in the periaortic lymphadenopathy without complete resolution   01/16/2015 PET scan  interval decrease in size and metabolic activity of the large left retroperitoneal mass , no increase in activity, size 10 mm decreased from 48 mm  SUV 1.9 , no additional hypermetabolic activity   CHIEF COMPLIANT: Rituxan maintenance  INTERVAL HISTORY: Selena [name] is a 80 year old with a low mention history of follicular lymphoma currently on Rituxan maintenance. Prior history of breast cancer for which she has been on letrozole therapy. She has been tolerating Rituxan and letrozole extremely well without any major problems or concerns. She has good energy levels. She does bruise easily. Denies any lumps or nodules.  REVIEW OF SYSTEMS:   Constitutional: Denies fevers, chills or abnormal weight loss Eyes:  Denies blurriness of vision Ears, nose, mouth, throat, and face: Denies mucositis or sore throat Respiratory: Denies cough, dyspnea or wheezes Cardiovascular: Denies palpitation, chest discomfort Gastrointestinal:  Denies nausea, heartburn or change in bowel habits Skin: Denies abnormal skin rashes Lymphatics: Denies new lymphadenopathy or easy bruising Neurological:Denies numbness, tingling or new weaknesses Behavioral/Psych: Mood is stable, no new changes  Extremities: No lower extremity edema Breast:  denies any pain or lumps or nodules in either breasts All other systems were reviewed with the patient and are negative.  I have reviewed the past medical history, past surgical history, social history and family history with the patient and they are unchanged from previous note.  ALLERGIES:  is allergic to codeine and codeine phosphate.  MEDICATIONS:  Current Outpatient Prescriptions  Medication Sig Dispense Refill  . cholecalciferol (VITAMIN D) 1000 UNITS tablet Take 1,000 Units by mouth daily.    . diclofenac (VOLTAREN) 75 MG EC tablet TAKE 1 TABLET BY MOUTH TWICE DAILY 180 tablet 3  . HYDROcodone-acetaminophen (NORCO) 10-325 MG tablet Take 1 tablet by mouth every 6 (six) hours as needed. 60 tablet 0  . letrozole (FEMARA) 2.5 MG tablet Take 1 tablet (2.5 mg total) by mouth daily. 90 tablet 3  . lidocaine-prilocaine (EMLA) cream APP EXT AA ONCE UTD  3  . meclizine (ANTIVERT) 25 MG tablet Take 1 tablet (25 mg total) by mouth every 6 (six) hours as needed for dizziness. 60 tablet 0  . UNABLE TO FIND Rx: L8000- Post Surgical Bras (Quantity: 6) E0712- Silicone Breast Prosthesis (Quantity: 1) Dx: 174.9; Right Mastectomy 1 each 0  No current facility-administered medications for this visit.    PHYSICAL EXAMINATION: ECOG PERFORMANCE STATUS: 1 - Symptomatic but completely ambulatory  Filed Vitals:   07/01/15 0912  BP: 160/76  Pulse: 76  Temp: 98.3 F (36.8 C)  Resp: 18   Filed  Weights   07/01/15 0912  Weight: 202 lb 4.8 oz (91.763 kg)    GENERAL:alert, no distress and comfortable SKIN: skin color, texture, turgor are normal, no rashes or significant lesions EYES: normal, Conjunctiva are pink and non-injected, sclera clear OROPHARYNX:no exudate, no erythema and lips, buccal mucosa, and tongue normal  NECK: supple, thyroid normal size, non-tender, without nodularity LYMPH:  no palpable lymphadenopathy in the cervical, axillary or inguinal LUNGS: clear to auscultation and percussion with normal breathing effort HEART: regular rate & rhythm and no murmurs and no lower extremity edema ABDOMEN:abdomen soft, non-tender and normal bowel sounds MUSCULOSKELETAL:no cyanosis of digits and no clubbing  NEURO: alert & oriented x 3 with fluent speech, no focal motor/sensory deficits EXTREMITIES: No lower extremity edema BREAST: No palpable masses or nodules in either right or left breasts. No palpable axillary supraclavicular or infraclavicular adenopathy no breast tenderness or nipple discharge. (exam performed in the presence of a chaperone)  LABORATORY DATA:  I have reviewed the data as listed   Chemistry      Component Value Date/Time   NA 145 04/30/2015 0959   NA 141 08/12/2013 0923   K 4.3 04/30/2015 0959   K 4.2 08/12/2013 0923   CL 106 08/12/2013 0923   CL 107 04/05/2012 1253   CO2 28 04/30/2015 0959   CO2 27 08/12/2013 0923   BUN 18.1 04/30/2015 0959   BUN 20 08/12/2013 0923   CREATININE 0.7 04/30/2015 0959   CREATININE 0.6 08/12/2013 0923      Component Value Date/Time   CALCIUM 9.6 04/30/2015 0959   CALCIUM 9.8 08/12/2013 0923   ALKPHOS 100 04/30/2015 0959   ALKPHOS 106 08/12/2013 0923   AST 51* 04/30/2015 0959   AST 42* 08/12/2013 0923   ALT 50 04/30/2015 0959   ALT 52* 08/12/2013 0923   BILITOT 0.67 04/30/2015 0959   BILITOT 0.7 08/12/2013 0923       Lab Results  Component Value Date   WBC 6.7 07/01/2015   HGB 14.5 07/01/2015   HCT  43.7 07/01/2015   MCV 97.4 07/01/2015   PLT 149 07/01/2015   NEUTROABS 4.6 07/01/2015     ASSESSMENT & PLAN:  Follicular 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 Retroperitoneal mass biopsy left side 95/09/3265: Follicular lymphoma low-grade grade 1-2, flow cytometry positive for CD10, 20, 3, 10, 5, BCL-2, BCl-6, CD21, Ki-67 less than 10%, stage IB (8X 4 cm mass)  Treatment plan:systemic chemotherapy with bendamustine and Rituxan 6 cycles ( From 08/13/2014 to 01/01/2015) followed by Rituxan maintenance every 2 months for 2 years  PET/CT scan 01/16/2015: Interval decrease in size and metabolic activity of the large left retroperitoneal mass , no increase in activity, size 10 mm decreased from 48 mm SUV 1.9 , no additional hypermetabolic activity  Plan: 1. Continue maintenance therapy with Rituxan every 2 months for 2 years Start 02/26/2015. 2. Return to clinic with Rituxan maintenance #4 in Aug 2017  Breast cancer of upper-outer quadrant of right female breast ([place]) Right breast cancer: Right mastectomy 04/14/2011: 2 foci of invasive ductal carcinoma 0.2 cm, grade 1 with background extensive DCIS, 0/5 lymph nodes negative,focus 1: ER 94%, PR 100%, Ki-67 5%, HER-2 negative: Focus to ER 100%BR 6%  Ki-67 29% HER-2 negative  Current treatment: continue with letrozole until April 2018   No orders of the defined types were placed in this encounter.   The patient has a good understanding of the overall plan. she agrees with it. she will call with any problems that may develop before the next visit here.   Rulon Eisenmenger, MD 07/01/2015

## 2015-07-01 NOTE — Patient Instructions (Signed)
[facility] Discharge Instructions for Patients Receiving Chemotherapy  Today you received the following chemotherapy agents: Rituxan   To help prevent nausea and vomiting after your treatment, we encourage you to take your nausea medication as directed.    If you develop nausea and vomiting that is not controlled by your nausea medication, call the clinic.   BELOW ARE SYMPTOMS THAT SHOULD BE REPORTED IMMEDIATELY:  *FEVER GREATER THAN 100.5 F  *CHILLS WITH OR WITHOUT FEVER  NAUSEA AND VOMITING THAT IS NOT CONTROLLED WITH YOUR NAUSEA MEDICATION  *UNUSUAL SHORTNESS OF BREATH  *UNUSUAL BRUISING OR BLEEDING  TENDERNESS IN MOUTH AND THROAT WITH OR WITHOUT PRESENCE OF ULCERS  *URINARY PROBLEMS  *BOWEL PROBLEMS  UNUSUAL RASH Items with * indicate a potential emergency and should be followed up as soon as possible.  Feel free to call the clinic you have any questions or concerns. The clinic phone number is [phone].  Please show the CHEMO ALERT CARD at check-in to the Emergency Department and triage nurse.   

## 2015-07-01 NOTE — Telephone Encounter (Signed)
appt made and avs printed °

## 2015-07-01 NOTE — Assessment & Plan Note (Signed)
Right breast cancer: Right mastectomy 04/14/2011: 2 foci of invasive ductal carcinoma 0.2 cm, grade 1 with background extensive DCIS, 0/5 lymph nodes negative,focus 1: ER 94%, PR 100%, Ki-67 5%, HER-2 negative: Focus to ER 100%BR 6% Ki-67 29% HER-2 negative  Current treatment: continue with letrozole until April 2018

## 2015-07-01 NOTE — Assessment & Plan Note (Signed)
Retroperitoneal mass biopsy left side 19/37/9024: Follicular lymphoma low-grade grade 1-2, flow cytometry positive for CD10, 20, 3, 10, 5, BCL-2, BCl-6, CD21, Ki-67 less than 10%, stage IB (8X 4 cm mass)  Treatment plan:systemic chemotherapy with bendamustine and Rituxan 6 cycles ( From 08/13/2014 to 01/01/2015) followed by Rituxan maintenance every 2 months for 2 years  PET/CT scan 01/16/2015: Interval decrease in size and metabolic activity of the large left retroperitoneal mass , no increase in activity, size 10 mm decreased from 48 mm SUV 1.9 , no additional hypermetabolic activity  Plan: 1. Continue maintenance therapy with Rituxan every 2 months for 2 years Start 02/26/2015. 2. Return to clinic with Rituxan maintenance #4 in Aug 2017

## 2015-07-09 ENCOUNTER — Encounter: Payer: Self-pay | Admitting: Family Medicine

## 2015-07-09 ENCOUNTER — Ambulatory Visit (INDEPENDENT_AMBULATORY_CARE_PROVIDER_SITE_OTHER): Payer: Medicare Other | Admitting: Family Medicine

## 2015-07-09 VITALS — BP 144/82 | HR 92 | Temp 98.1°F | Ht 67.0 in | Wt 202.2 lb

## 2015-07-09 DIAGNOSIS — R300 Dysuria: Secondary | ICD-10-CM

## 2015-07-09 DIAGNOSIS — N3090 Cystitis, unspecified without hematuria: Secondary | ICD-10-CM

## 2015-07-09 LAB — POCT URINALYSIS DIPSTICK
Bilirubin, UA: NEGATIVE
Glucose, UA: NEGATIVE
Ketones, UA: NEGATIVE
NITRITE UA: NEGATIVE
PH UA: 6
SPEC GRAV UA: 1.01
UROBILINOGEN UA: 0.2

## 2015-07-09 MED ORDER — AMOXICILLIN-POT CLAVULANATE 875-125 MG PO TABS: 1.0000 | ORAL_TABLET | Freq: Two times a day (BID) | ORAL | Status: DC

## 2015-07-09 NOTE — Progress Notes (Signed)
Pre visit review using our clinic review tool, if applicable. No additional management support is needed unless otherwise documented below in the visit note. 

## 2015-07-09 NOTE — Progress Notes (Signed)
Subjective:    Patient ID: Selena [name], female    DOB: 1935/09/11, 80 y.o.   MRN: [id number]  HPI  Selena [name] is a 80 year old female who presents today with symptoms of urinary burning for 6 days. Associated symptoms of frequency and urgency are present.  Denies fever, chills, sweats,N/V/D,  flank pain, and hematuria.  Treatment at home includes OTC azo treatment which has provided moderate benefit. No recent antibiotic use and no history of frequent urinary tract infections. No aggravating factors noted  related to caffeine intake or decrease in water intake.    Review of Systems  Constitutional: Negative for fever and chills.  Cardiovascular: Negative for chest pain and palpitations.  Gastrointestinal: Negative for nausea and vomiting.  Genitourinary: Positive for dysuria, urgency and frequency. Negative for hematuria and flank pain.  Skin: Negative for rash.  Neurological: Negative for light-headedness.   Past Medical History  Diagnosis Date  . BACK PAIN, UPPER 07/09/2008  . DIVERTICULOSIS, COLON 12/26/2006  . LIVER FUNCTION TESTS, ABNORMAL, HX OF 12/26/2007  . Memory loss 05/16/2007  . VERTIGO 09/07/2009  . Cataract   . Goiter     multi-nodular  . Hx of colonoscopy 2009  . Wears glasses   . Wears hearing aid     bilateral  . Wears dentures   . Complication of anesthesia   . PONV (postoperative nausea and vomiting)   . Atrial fibrillation ([place]) 01/01/2007  . Vertigo     hx of;had Phenergan prn   . OSTEOARTHRITIS 12/26/2006    takes Diclofenac daily but has stopped for surgery  . Bruises easily   . Diverticulitis   . Skin cancer   . Breast cancer, stage 1 ([place])   . Follicular non-Hodgkin's lymphoma ([place])   . Anemia     PMH     Social History   Social History  . Marital Status: Widowed    Spouse Name: N/A  . Number of Children: N/A  . Years of Education: N/A   Occupational History  . Not on file.   Social History Main Topics  . Smoking status: Never Smoker    . Smokeless tobacco: Never Used  . Alcohol Use: No  . Drug Use: No  . Sexual Activity: Yes    Birth Control/ Protection: Surgical   Other Topics Concern  . Not on file   Social History Narrative    Past Surgical History  Procedure Laterality Date  . Abdominal hysterectomy    . Rotator cuff repair  1999    right  . Knee surgery  2004/2010    arthroscopic/ bilateral knee replacements  . Cataract extraction w/ intraocular lens  implant, bilateral  2010    bilateral  . Colonoscopy    . Appendectomy    . Ablation of dysrhythmic focus  2009  . Tmi  1998  . Colonoscopy    . Breast biopsy  2013  . Breast reconstruction  04/14/2011    Procedure: BREAST RECONSTRUCTION;  Surgeon: Crissie Reese, MD;  Location: [facility];  Service: Plastics;  Laterality: Right;  Placement of Right Breast Tissue Expander with  use of Flex HD for Breast Reconstruction  . Breast surgery      right sm, snbx  . Tonsillectomy    . Portacath placement Right 08/11/2014    Procedure: INSERTION PORT-A-CATH WITH ULTRASOUND;  Surgeon: Rolm Bookbinder, MD;  Location: [facility] OR;  Service: General;  Laterality: Right;    Family History  Problem Relation Age of  Onset  . Colon cancer Mother   . Cancer Mother     colon  . Colon cancer Maternal Aunt   . Cancer Maternal Uncle   . Prostate cancer Maternal Uncle   . Prostate cancer Maternal Uncle   . Other Maternal Uncle   . Anesthesia problems Neg Hx   . Hypotension Neg Hx   . Malignant hyperthermia Neg Hx   . Pseudochol deficiency Neg Hx   . Cancer Sister     kidney  . Cancer Brother     lymph node    Allergies  Allergen Reactions  . Codeine Other (See Comments)    Hallucinations   . Codeine Phosphate Other (See Comments)    Hallucinations    Current Outpatient Prescriptions on File Prior to Visit  Medication Sig Dispense Refill  . cholecalciferol (VITAMIN D) 1000 UNITS tablet Take 1,000 Units by mouth daily.    . diclofenac (VOLTAREN) 75 MG EC tablet  TAKE 1 TABLET BY MOUTH TWICE DAILY 180 tablet 3  . HYDROcodone-acetaminophen (NORCO) 10-325 MG tablet Take 1 tablet by mouth every 6 (six) hours as needed. 60 tablet 0  . letrozole (FEMARA) 2.5 MG tablet Take 1 tablet (2.5 mg total) by mouth daily. 90 tablet 3  . lidocaine-prilocaine (EMLA) cream APP EXT AA ONCE UTD  3  . meclizine (ANTIVERT) 25 MG tablet Take 1 tablet (25 mg total) by mouth every 6 (six) hours as needed for dizziness. 60 tablet 0  . UNABLE TO FIND Rx: L8000- Post Surgical Bras (Quantity: 6) [id number]- Silicone Breast Prosthesis (Quantity: 1) Dx: 174.9; Right Mastectomy 1 each 0   No current facility-administered medications on file prior to visit.    BP 144/82 mmHg  Pulse 92  Temp(Src) 98.1 F (36.7 C) (Oral)  Ht 5\' 7"  (1.702 m)  Wt 202 lb 3 oz (91.712 kg)  BMI 31.66 kg/m2  SpO2 96%      Objective:   Physical Exam  Constitutional: She is oriented to person, place, and time. She appears well-developed and well-nourished.  Afebrile and in NAD  Cardiovascular: Normal rate and regular rhythm.   Pulmonary/Chest: Effort normal and breath sounds normal. She has no wheezes.  Abdominal: Soft. Bowel sounds are normal. There is no tenderness. There is no CVA tenderness.  No CVA or suprapubic tenderness present.  Neurological: She is alert and oriented to person, place, and time.  Skin: Skin is warm and dry. No rash noted.       Assessment & Plan:  1. Cystitis UA noted +3 leukocytes with symptoms of dysuria present. Treatment initiated with instructions provided for follow up if symptoms do not improve, worsen, or she develops a fever >101. Bactrim DS avoided due to age and possible adverse effects in this population. Culture will be obtained.  - amoxicillin-clavulanate (AUGMENTIN) 875-125 MG tablet; Take 1 tablet by mouth 2 (two) times daily.  Dispense: 20 tablet; Refill: 0  2. Dysuria  - POCT Urinalysis Dipstick  Written instructions for cystitis provided. Patient  will follow up if symptoms do not improve with therapy in 2-3 days or worsen. Patient voiced understanding and agreed with plan.  Delano Metz, FNP-C

## 2015-07-09 NOTE — Patient Instructions (Signed)
Please take medication with food as directed and follow up with your provider if symptoms do not improve, worsen, or you develop a fever >101.   Urinary Tract Infection Urinary tract infections (UTIs) can develop anywhere along your urinary tract. Your urinary tract is your body's drainage system for removing wastes and extra water. Your urinary tract includes two kidneys, two ureters, a bladder, and a urethra. Your kidneys are a pair of bean-shaped organs. Each kidney is about the size of your fist. They are located below your ribs, one on each side of your spine. CAUSES Infections are caused by microbes, which are microscopic organisms, including fungi, viruses, and bacteria. These organisms are so small that they can only be seen through a microscope. Bacteria are the microbes that most commonly cause UTIs. SYMPTOMS  Symptoms of UTIs may vary by age and gender of the patient and by the location of the infection. Symptoms in young women typically include a frequent and intense urge to urinate and a painful, burning feeling in the bladder or urethra during urination. Older women and men are more likely to be tired, shaky, and weak and have muscle aches and abdominal pain. A fever may mean the infection is in your kidneys. Other symptoms of a kidney infection include pain in your back or sides below the ribs, nausea, and vomiting. DIAGNOSIS To diagnose a UTI, your caregiver will ask you about your symptoms. Your caregiver will also ask you to provide a urine sample. The urine sample will be tested for bacteria and white blood cells. White blood cells are made by your body to help fight infection. TREATMENT  Typically, UTIs can be treated with medication. Because most UTIs are caused by a bacterial infection, they usually can be treated with the use of antibiotics. The choice of antibiotic and length of treatment depend on your symptoms and the type of bacteria causing your infection. HOME CARE  INSTRUCTIONS  If you were prescribed antibiotics, take them exactly as your caregiver instructs you. Finish the medication even if you feel better after you have only taken some of the medication.  Drink enough water and fluids to keep your urine clear or pale yellow.  Avoid caffeine, tea, and carbonated beverages. They tend to irritate your bladder.  Empty your bladder often. Avoid holding urine for long periods of time.  Empty your bladder before and after sexual intercourse.  After a bowel movement, women should cleanse from front to back. Use each tissue only once. SEEK MEDICAL CARE IF:   You have back pain.  You develop a fever.  Your symptoms do not begin to resolve within 3 days. SEEK IMMEDIATE MEDICAL CARE IF:   You have severe back pain or lower abdominal pain.  You develop chills.  You have nausea or vomiting.  You have continued burning or discomfort with urination. MAKE SURE YOU:   Understand these instructions.  Will watch your condition.  Will get help right away if you are not doing well or get worse.   This information is not intended to replace advice given to you by your health care provider. Make sure you discuss any questions you have with your health care provider.   Document Released: 10/20/2004 Document Revised: 10/01/2014 Document Reviewed: 02/18/2011 Elsevier Interactive Patient Education [employer].

## 2015-07-12 LAB — CULTURE, URINE COMPREHENSIVE: Colony Count: >=100000

## 2015-07-13 ENCOUNTER — Telehealth: Payer: Self-pay | Admitting: Internal Medicine

## 2015-07-13 NOTE — Telephone Encounter (Signed)
Spoke with pt

## 2015-07-13 NOTE — Telephone Encounter (Signed)
Pt is return araceli call

## 2015-07-21 ENCOUNTER — Ambulatory Visit (INDEPENDENT_AMBULATORY_CARE_PROVIDER_SITE_OTHER): Payer: Medicare Other | Admitting: Internal Medicine

## 2015-07-21 ENCOUNTER — Encounter: Payer: Self-pay | Admitting: Internal Medicine

## 2015-07-21 VITALS — BP 130/80 | HR 79 | Temp 98.2°F | Resp 20 | Ht 67.0 in | Wt 204.0 lb

## 2015-07-21 DIAGNOSIS — M150 Primary generalized (osteo)arthritis: Secondary | ICD-10-CM | POA: Diagnosis not present

## 2015-07-21 DIAGNOSIS — L97319 Non-pressure chronic ulcer of right ankle with unspecified severity: Secondary | ICD-10-CM | POA: Diagnosis not present

## 2015-07-21 DIAGNOSIS — M159 Polyosteoarthritis, unspecified: Secondary | ICD-10-CM

## 2015-07-21 DIAGNOSIS — C8203 Follicular lymphoma grade I, intra-abdominal lymph nodes: Secondary | ICD-10-CM

## 2015-07-21 NOTE — Progress Notes (Signed)
Pre visit review using our clinic review tool, if applicable. No additional management support is needed unless otherwise documented below in the visit note. 

## 2015-07-21 NOTE — Patient Instructions (Signed)
  Clean the lesion twice daily and apply antibiotic ointment  Call or return to clinic prn if these symptoms worsen or fail to improve as anticipated.   Oncology follow-up as scheduled

## 2015-07-21 NOTE — Progress Notes (Signed)
Subjective:    Patient ID: Selena [name], female    DOB: 01/02/1936, 80 y.o.   MRN: [id number]  HPI  80 year old patient who has a history of osteoarthritis in paroxysmal atrial fibrillation. She complains of wounds involving her lower extremities that she attributes to mosquito bites.  These start as a small pustule and then spread to become larger superficial ulcers that slowly heal She also complains of chronic right ankle edema of over 12 months duration.  This has not responded well to elevation, salt restriction, TED hose diuretics.  Past Medical History  Diagnosis Date  . BACK PAIN, UPPER 07/09/2008  . DIVERTICULOSIS, COLON 12/26/2006  . LIVER FUNCTION TESTS, ABNORMAL, HX OF 12/26/2007  . Memory loss 05/16/2007  . VERTIGO 09/07/2009  . Cataract   . Goiter     multi-nodular  . Hx of colonoscopy 2009  . Wears glasses   . Wears hearing aid     bilateral  . Wears dentures   . Complication of anesthesia   . PONV (postoperative nausea and vomiting)   . Atrial fibrillation ([place]) 01/01/2007  . Vertigo     hx of;had Phenergan prn   . OSTEOARTHRITIS 12/26/2006    takes Diclofenac daily but has stopped for surgery  . Bruises easily   . Diverticulitis   . Skin cancer   . Breast cancer, stage 1 ([place])   . Follicular non-Hodgkin's lymphoma ([place])   . Anemia     PMH     Social History   Social History  . Marital Status: Widowed    Spouse Name: N/A  . Number of Children: N/A  . Years of Education: N/A   Occupational History  . Not on file.   Social History Main Topics  . Smoking status: Never Smoker   . Smokeless tobacco: Never Used  . Alcohol Use: No  . Drug Use: No  . Sexual Activity: Yes    Birth Control/ Protection: Surgical   Other Topics Concern  . Not on file   Social History Narrative    Past Surgical History  Procedure Laterality Date  . Abdominal hysterectomy    . Rotator cuff repair  1999    right  . Knee surgery  2004/2010    arthroscopic/  bilateral knee replacements  . Cataract extraction w/ intraocular lens  implant, bilateral  2010    bilateral  . Colonoscopy    . Appendectomy    . Ablation of dysrhythmic focus  2009  . Tmi  1998  . Colonoscopy    . Breast biopsy  2013  . Breast reconstruction  04/14/2011    Procedure: BREAST RECONSTRUCTION;  Surgeon: Crissie Reese, MD;  Location: [facility];  Service: Plastics;  Laterality: Right;  Placement of Right Breast Tissue Expander with  use of Flex HD for Breast Reconstruction  . Breast surgery      right sm, snbx  . Tonsillectomy    . Portacath placement Right 08/11/2014    Procedure: INSERTION PORT-A-CATH WITH ULTRASOUND;  Surgeon: Rolm Bookbinder, MD;  Location: [facility];  Service: General;  Laterality: Right;    Family History  Problem Relation Age of Onset  . Colon cancer Mother   . Cancer Mother     colon  . Colon cancer Maternal Aunt   . Cancer Maternal Uncle   . Prostate cancer Maternal Uncle   . Prostate cancer Maternal Uncle   . Other Maternal Uncle   . Anesthesia problems Neg Hx   . Hypotension  Neg Hx   . Malignant hyperthermia Neg Hx   . Pseudochol deficiency Neg Hx   . Cancer Sister     kidney  . Cancer Brother     lymph node    Allergies  Allergen Reactions  . Codeine Other (See Comments)    Hallucinations   . Codeine Phosphate Other (See Comments)    Hallucinations    Current Outpatient Prescriptions on File Prior to Visit  Medication Sig Dispense Refill  . cholecalciferol (VITAMIN D) 1000 UNITS tablet Take 1,000 Units by mouth daily.    . diclofenac (VOLTAREN) 75 MG EC tablet TAKE 1 TABLET BY MOUTH TWICE DAILY 180 tablet 3  . HYDROcodone-acetaminophen (NORCO) 10-325 MG tablet Take 1 tablet by mouth every 6 (six) hours as needed. 60 tablet 0  . letrozole (FEMARA) 2.5 MG tablet Take 1 tablet (2.5 mg total) by mouth daily. 90 tablet 3  . lidocaine-prilocaine (EMLA) cream APP EXT AA ONCE UTD  3  . meclizine (ANTIVERT) 25 MG tablet Take 1 tablet (25  mg total) by mouth every 6 (six) hours as needed for dizziness. 60 tablet 0  . UNABLE TO FIND Rx: L8000- Post Surgical Bras (Quantity: 6) [id number]- Silicone Breast Prosthesis (Quantity: 1) Dx: 174.9; Right Mastectomy 1 each 0   No current facility-administered medications on file prior to visit.    BP 130/80 mmHg  Pulse 79  Temp(Src) 98.2 F (36.8 C) (Oral)  Resp 20  Ht 5\' 7"  (1.702 m)  Wt 204 lb (92.534 kg)  BMI 31.94 kg/m2  SpO2 98%     Review of Systems  Constitutional: Negative.   HENT: Negative for congestion, dental problem, hearing loss, rhinorrhea, sinus pressure, sore throat and tinnitus.   Eyes: Negative for pain, discharge and visual disturbance.  Respiratory: Negative for cough and shortness of breath.   Cardiovascular: Positive for leg swelling. Negative for chest pain and palpitations.  Gastrointestinal: Negative for nausea, vomiting, abdominal pain, diarrhea, constipation, blood in stool and abdominal distention.  Genitourinary: Negative for dysuria, urgency, frequency, hematuria, flank pain, vaginal bleeding, vaginal discharge, difficulty urinating, vaginal pain and pelvic pain.  Musculoskeletal: Negative for joint swelling, arthralgias and gait problem.  Skin: Positive for rash.  Neurological: Negative for dizziness, syncope, speech difficulty, weakness, numbness and headaches.  Hematological: Negative for adenopathy.  Psychiatric/Behavioral: Negative for behavioral problems, dysphoric mood and agitation. The patient is not nervous/anxious.        Objective:   Physical Exam  Constitutional: She appears well-developed and well-nourished. No distress.  Skin:  And 1.5 x 2 cm superficial ulcer with crusting involving the right lateral ankle area. Plus 1 right ankle edema A smaller erythematous nodule proximally 5 mm in diameter noted involving the lower posterior right calf          Assessment & Plan:   Superficial ulcer right lateral ankle.  Unclear  whether this is an inflammatory reaction to an insect bite or related to venous stasis disease.  These areas heal up quite quickly that would argue against venous stasis ulceration.  She has had chronic right ankle edema and is requesting referral to vascular and vein specialist.  Will continue local wound care that includes hydrocortisone and triple antibiotic ointment.  Right ankle edema may be secondary to intra-abdominal pathology/adenopathy

## 2015-08-17 ENCOUNTER — Encounter: Payer: Self-pay | Admitting: Internal Medicine

## 2015-08-17 ENCOUNTER — Ambulatory Visit (INDEPENDENT_AMBULATORY_CARE_PROVIDER_SITE_OTHER): Payer: Medicare Other | Admitting: Internal Medicine

## 2015-08-17 VITALS — HR 65 | Temp 98.2°F | Ht 67.0 in | Wt 202.0 lb

## 2015-08-17 DIAGNOSIS — L989 Disorder of the skin and subcutaneous tissue, unspecified: Secondary | ICD-10-CM

## 2015-08-17 DIAGNOSIS — C8203 Follicular lymphoma grade I, intra-abdominal lymph nodes: Secondary | ICD-10-CM

## 2015-08-17 DIAGNOSIS — I480 Paroxysmal atrial fibrillation: Secondary | ICD-10-CM | POA: Diagnosis not present

## 2015-08-17 DIAGNOSIS — R238 Other skin changes: Secondary | ICD-10-CM

## 2015-08-17 NOTE — Patient Instructions (Signed)
Keep area clean and dry  Follow-up hematology as scheduled  Call or return to clinic prn if these symptoms worsen or fail to improve as anticipated.

## 2015-08-17 NOTE — Progress Notes (Signed)
Pre visit review using our clinic review tool, if applicable. No additional management support is needed unless otherwise documented below in the visit note. 

## 2015-08-17 NOTE — Progress Notes (Signed)
Subjective:    Patient ID: Selena [name], female    DOB: Dec 31, 1935, 80 y.o.   MRN: [id number]  HPI 80 year old patient who has a history of paroxysmal atrial fibrillation osteoarthritis.  She is followed by oncology.  Due to a follicular lymphoma. She presents today with a lesion involving her right posterior ankle area.  She states that she initially had an insect bite to this area and after applying OTC hydrocortisone cream has developed a large bulla. She states that she had a similar lesion involving her upper arm that she also attributed to an insect bite  Past Medical History:  Diagnosis Date  . Anemia    PMH  . Atrial fibrillation ([place]) 01/01/2007  . BACK PAIN, UPPER 07/09/2008  . Breast cancer, stage 1 ([place])   . Bruises easily   . Cataract   . Complication of anesthesia   . Diverticulitis   . DIVERTICULOSIS, COLON 12/26/2006  . Follicular non-Hodgkin's lymphoma ([place])   . Goiter    multi-nodular  . Hx of colonoscopy 2009  . LIVER FUNCTION TESTS, ABNORMAL, HX OF 12/26/2007  . Memory loss 05/16/2007  . OSTEOARTHRITIS 12/26/2006   takes Diclofenac daily but has stopped for surgery  . PONV (postoperative nausea and vomiting)   . Skin cancer   . VERTIGO 09/07/2009  . Vertigo    hx of;had Phenergan prn   . Wears dentures   . Wears glasses   . Wears hearing aid    bilateral     Social History   Social History  . Marital status: Widowed    Spouse name: N/A  . Number of children: N/A  . Years of education: N/A   Occupational History  . Not on file.   Social History Main Topics  . Smoking status: Never Smoker  . Smokeless tobacco: Never Used  . Alcohol use No  . Drug use: No  . Sexual activity: Yes    Birth control/ protection: Surgical   Other Topics Concern  . Not on file   Social History Narrative  . No narrative on file    Past Surgical History:  Procedure Laterality Date  . ABDOMINAL HYSTERECTOMY    . ABLATION OF DYSRHYTHMIC FOCUS  2009  .  APPENDECTOMY    . BREAST BIOPSY  2013  . BREAST RECONSTRUCTION  04/14/2011   Procedure: BREAST RECONSTRUCTION;  Surgeon: Crissie Reese, MD;  Location: [facility];  Service: Plastics;  Laterality: Right;  Placement of Right Breast Tissue Expander with  use of Flex HD for Breast Reconstruction  . BREAST SURGERY     right sm, snbx  . CATARACT EXTRACTION W/ INTRAOCULAR LENS  IMPLANT, BILATERAL  2010   bilateral  . COLONOSCOPY    . COLONOSCOPY    . KNEE SURGERY  2004/2010   arthroscopic/ bilateral knee replacements  . PORTACATH PLACEMENT Right 08/11/2014   Procedure: INSERTION PORT-A-CATH WITH ULTRASOUND;  Surgeon: Rolm Bookbinder, MD;  Location: [facility];  Service: General;  Laterality: Right;  . [place]   right  . tmi  1998  . TONSILLECTOMY      Family History  Problem Relation Age of Onset  . Colon cancer Mother   . Cancer Mother     colon  . Colon cancer Maternal Aunt   . Cancer Maternal Uncle   . Prostate cancer Maternal Uncle   . Prostate cancer Maternal Uncle   . Other Maternal Uncle   . Anesthesia problems Neg Hx   .  Hypotension Neg Hx   . Malignant hyperthermia Neg Hx   . Pseudochol deficiency Neg Hx   . Cancer Sister     kidney  . Cancer Brother     lymph node    Allergies  Allergen Reactions  . Codeine Other (See Comments)    Hallucinations   . Codeine Phosphate Other (See Comments)    Hallucinations    Current Outpatient Prescriptions on File Prior to Visit  Medication Sig Dispense Refill  . cholecalciferol (VITAMIN D) 1000 UNITS tablet Take 1,000 Units by mouth daily.    . diclofenac (VOLTAREN) 75 MG EC tablet TAKE 1 TABLET BY MOUTH TWICE DAILY 180 tablet 3  . HYDROcodone-acetaminophen (NORCO) 10-325 MG tablet Take 1 tablet by mouth every 6 (six) hours as needed. 60 tablet 0  . letrozole (FEMARA) 2.5 MG tablet Take 1 tablet (2.5 mg total) by mouth daily. 90 tablet 3  . lidocaine-prilocaine (EMLA) cream APP EXT AA ONCE UTD  3  . meclizine  (ANTIVERT) 25 MG tablet Take 1 tablet (25 mg total) by mouth every 6 (six) hours as needed for dizziness. 60 tablet 0  . UNABLE TO FIND Rx: L8000- Post Surgical Bras (Quantity: 6) [id number]- Silicone Breast Prosthesis (Quantity: 1) Dx: 174.9; Right Mastectomy 1 each 0   No current facility-administered medications on file prior to visit.     BP (!) (P) 142/64   Pulse 65   Temp 98.2 F (36.8 C) (Oral)   Ht 5\' 7"  (1.702 m)   Wt 202 lb (91.6 kg)   SpO2 98%   BMI 31.64 kg/m      Review of Systems  Constitutional: Negative for appetite change.  HENT: Negative for ear pain, hearing loss, mouth sores, nosebleeds, sinus pressure, sore throat and voice change.   Eyes: Negative for photophobia, pain, redness and visual disturbance.  Respiratory: Negative for cough, chest tightness and shortness of breath.   Cardiovascular: Negative for chest pain, palpitations and leg swelling.  Gastrointestinal: Negative for abdominal distention, abdominal pain, blood in stool, constipation, diarrhea, nausea, rectal pain and vomiting.  Genitourinary: Negative for difficulty urinating, dysuria, flank pain, frequency, genital sores, menstrual problem, pelvic pain, urgency, vaginal bleeding, vaginal discharge and vaginal pain.  Musculoskeletal: Negative for arthralgias, back pain and neck stiffness.  Skin: Positive for wound. Negative for rash.  Neurological: Negative for dizziness, syncope, speech difficulty, weakness, light-headedness, numbness and headaches.  Hematological: Negative for adenopathy. Does not bruise/bleed easily.  Psychiatric/Behavioral: Negative for agitation, behavioral problems, dysphoric mood, self-injury and suicidal ideas. The patient is not nervous/anxious.        Objective:   Physical Exam  Constitutional: She appears well-developed and well-nourished. No distress.  Afebrile Blood pressure 140/64  Skin:  2 x 2 centimeter bulla involving the right distal posterior calf area.           Assessment & Plan:   Bulla right lower posterior calf.  After alcohol cleansing the bulla was punctured with a sterile needle and the clear contents evacuated.  Antibiotic ointment applied and the wound dressed  Nyoka Cowden, MD

## 2015-08-26 NOTE — Assessment & Plan Note (Signed)
Retroperitoneal mass biopsy left side 07/24/2014: Follicular lymphoma low-grade grade 1-2, flow cytometry positive for CD10, 20, 3, 10, 5, BCL-2, BCl-6, CD21, Ki-67 less than 10%, stage IB (8X 4 cm mass)  Treatment plan:systemic chemotherapy with bendamustine and Rituxan 6 cycles ( From 08/13/2014 to 01/01/2015) followed by Rituxan maintenance every 2 months for 2 years  PET/CT scan 01/16/2015: Interval decrease in size and metabolic activity of the large left retroperitoneal mass , no increase in activity, size 10 mm decreased from 48 mm SUV 1.9 , no additional hypermetabolic activity  Plan: 1. Continue maintenance therapy with Rituxan every 2 months for 2 years Start 02/26/2015. 2. Return to clinic with Rituxan maintenance #4 in Aug 2017  Breast cancer of upper-outer quadrant of right female breast ([name]) Right breast cancer: Right mastectomy 04/14/2011: 2 foci of invasive ductal carcinoma 0.2 cm, grade 1 with background extensive DCIS, 0/5 lymph nodes negative,focus 1: ER 94%, PR 100%, Ki-67 5%, HER-2 negative: Focus to ER 100%BR 6% Ki-67 29% HER-2 negative  Current treatment: continue with Rituxan until April 2018 

## 2015-08-26 NOTE — Assessment & Plan Note (Signed)
Retroperitoneal mass biopsy left side 91/47/8295: Follicular lymphoma low-grade grade 1-2, flow cytometry positive for CD10, 20, 3, 10, 5, BCL-2, BCl-6, CD21, Ki-67 less than 10%, stage IB (8X 4 cm mass)  Treatment plan:systemic chemotherapy with bendamustine and Rituxan 6 cycles ( From 08/13/2014 to 01/01/2015) followed by Rituxan maintenance every 2 months for 2 years  PET/CT scan 01/16/2015: Interval decrease in size and metabolic activity of the large left retroperitoneal mass , no increase in activity, size 10 mm decreased from 48 mm SUV 1.9 , no additional hypermetabolic activity  Plan: 1. Continue maintenance therapy with Rituxan every 2 months for 2 years Start 02/26/2015. 2. Return to clinic with Rituxan maintenance #5 in Oct 2017

## 2015-08-27 ENCOUNTER — Ambulatory Visit (HOSPITAL_BASED_OUTPATIENT_CLINIC_OR_DEPARTMENT_OTHER): Payer: Medicare Other

## 2015-08-27 ENCOUNTER — Telehealth: Payer: Self-pay | Admitting: Hematology and Oncology

## 2015-08-27 ENCOUNTER — Ambulatory Visit (HOSPITAL_BASED_OUTPATIENT_CLINIC_OR_DEPARTMENT_OTHER): Payer: Medicare Other | Admitting: Hematology and Oncology

## 2015-08-27 ENCOUNTER — Encounter: Payer: Self-pay | Admitting: Hematology and Oncology

## 2015-08-27 ENCOUNTER — Other Ambulatory Visit (HOSPITAL_BASED_OUTPATIENT_CLINIC_OR_DEPARTMENT_OTHER): Payer: Medicare Other

## 2015-08-27 VITALS — BP 157/65 | HR 63 | Temp 98.1°F | Resp 18 | Ht 67.0 in

## 2015-08-27 DIAGNOSIS — C8203 Follicular lymphoma grade I, intra-abdominal lymph nodes: Secondary | ICD-10-CM | POA: Diagnosis not present

## 2015-08-27 DIAGNOSIS — Z5112 Encounter for antineoplastic immunotherapy: Secondary | ICD-10-CM | POA: Diagnosis not present

## 2015-08-27 DIAGNOSIS — C50411 Malignant neoplasm of upper-outer quadrant of right female breast: Secondary | ICD-10-CM | POA: Diagnosis not present

## 2015-08-27 DIAGNOSIS — R21 Rash and other nonspecific skin eruption: Secondary | ICD-10-CM

## 2015-08-27 LAB — CBC WITH DIFFERENTIAL/PLATELET
BASO%: 0.5 % (ref 0.0–2.0)
Basophils Absolute: 0 10*3/uL (ref 0.0–0.1)
EOS ABS: 0.4 10*3/uL (ref 0.0–0.5)
EOS%: 6.2 % (ref 0.0–7.0)
HCT: 41.7 % (ref 34.8–46.6)
HEMOGLOBIN: 14.4 g/dL (ref 11.6–15.9)
LYMPH%: 11.1 % — ABNORMAL LOW (ref 14.0–49.7)
MCH: 33.4 pg (ref 25.1–34.0)
MCHC: 34.5 g/dL (ref 31.5–36.0)
MCV: 96.8 fL (ref 79.5–101.0)
MONO#: 0.7 10*3/uL (ref 0.1–0.9)
MONO%: 12.1 % (ref 0.0–14.0)
NEUT%: 70.1 % (ref 38.4–76.8)
NEUTROS ABS: 4 10*3/uL (ref 1.5–6.5)
PLATELETS: 143 10*3/uL — AB (ref 145–400)
RBC: 4.31 10*6/uL (ref 3.70–5.45)
RDW: 12.9 % (ref 11.2–14.5)
WBC: 5.8 10*3/uL (ref 3.9–10.3)
lymph#: 0.6 10*3/uL — ABNORMAL LOW (ref 0.9–3.3)

## 2015-08-27 LAB — COMPREHENSIVE METABOLIC PANEL
ALBUMIN: 3.7 g/dL (ref 3.5–5.0)
ALK PHOS: 114 U/L (ref 40–150)
ALT: 60 U/L — AB (ref 0–55)
AST: 48 U/L — ABNORMAL HIGH (ref 5–34)
Anion Gap: 9 mEq/L (ref 3–11)
BILIRUBIN TOTAL: 0.54 mg/dL (ref 0.20–1.20)
BUN: 15.1 mg/dL (ref 7.0–26.0)
CO2: 26 mEq/L (ref 22–29)
Calcium: 9.8 mg/dL (ref 8.4–10.4)
Chloride: 109 mEq/L (ref 98–109)
Creatinine: 0.7 mg/dL (ref 0.6–1.1)
EGFR: 83 mL/min/{1.73_m2} — AB (ref >90)
GLUCOSE: 93 mg/dL (ref 70–140)
Potassium: 4 mEq/L (ref 3.5–5.1)
SODIUM: 144 meq/L (ref 136–145)
TOTAL PROTEIN: 6.4 g/dL (ref 6.4–8.3)

## 2015-08-27 MED ORDER — SODIUM CHLORIDE 0.9 % IJ SOLN
10.0000 mL | INTRAMUSCULAR | Status: DC | PRN
  Administered 2015-08-27: 10 mL
  Filled 2015-08-27: qty 10

## 2015-08-27 MED ORDER — SODIUM CHLORIDE 0.9 % IV SOLN
Freq: Once | INTRAVENOUS | Status: AC
  Administered 2015-08-27: 12:00:00 via INTRAVENOUS

## 2015-08-27 MED ORDER — ACETAMINOPHEN 325 MG PO TABS
650.0000 mg | ORAL_TABLET | Freq: Once | ORAL | Status: AC
  Administered 2015-08-27: 650 mg via ORAL

## 2015-08-27 MED ORDER — HEPARIN SOD (PORK) LOCK FLUSH 100 UNIT/ML IV SOLN
500.0000 [IU] | Freq: Once | INTRAVENOUS | Status: AC | PRN
  Administered 2015-08-27: 500 [IU]
  Filled 2015-08-27: qty 5

## 2015-08-27 MED ORDER — DIPHENHYDRAMINE HCL 25 MG PO CAPS
ORAL_CAPSULE | ORAL | Status: AC
  Filled 2015-08-27: qty 2

## 2015-08-27 MED ORDER — DIPHENHYDRAMINE HCL 25 MG PO CAPS
50.0000 mg | ORAL_CAPSULE | Freq: Once | ORAL | Status: AC
  Administered 2015-08-27: 50 mg via ORAL

## 2015-08-27 MED ORDER — ACETAMINOPHEN 325 MG PO TABS
ORAL_TABLET | ORAL | Status: AC
Start: 2015-08-27 — End: 2015-08-27
  Filled 2015-08-27: qty 2

## 2015-08-27 MED ORDER — SODIUM CHLORIDE 0.9 % IV SOLN
375.0000 mg/m2 | Freq: Once | INTRAVENOUS | Status: AC
  Administered 2015-08-27: 800 mg via INTRAVENOUS
  Filled 2015-08-27: qty 50

## 2015-08-27 MED ORDER — LETROZOLE 2.5 MG PO TABS: 2.5000 mg | ORAL_TABLET | Freq: Every day | ORAL | 3 refills | Status: DC

## 2015-08-27 NOTE — Telephone Encounter (Signed)
per pof to sch pt appt-pt to get updated copy of avs/cal

## 2015-08-27 NOTE — Progress Notes (Signed)
Patient Care Team: Marletta Lor, MD as PCP - General  DIAGNOSIS: Breast cancer of upper-outer quadrant of right female breast [facility])   Staging form: Breast, AJCC 7th Edition   - Clinical: Tis (DCIS), NX, cM0 - Unsigned  SUMMARY OF ONCOLOGIC HISTORY:   Breast cancer of upper-outer quadrant of right female breast ([place])   04/14/2011 Surgery    Right mastectomy: 2 foci of invasive ductal carcinoma 0.2 cm, grade 1 with background extensive DCIS, 0/5 lymph nodes negative,focus 1: ER 94%, PR 100%, Ki-67 5%, HER-2 negative: Focus to ER 100%BR 6% Ki-67 29% HER-2 negative     05/02/2011 -  Anti-estrogen oral therapy    letrozole 2.5 mg daily      Follicular 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 ([name])   07/24/2014 Initial Diagnosis    Retroperitoneal mass biopsy left side: Follicular lymphoma low-grade grade 1-2, flow cytometry positive for CD10, 20, 3, 10, 5, BCL-2, BCl-6, CD21, Ki-67 less than 10%     08/13/2014 -  Chemotherapy    Bendamustine and Rituxan day 1, 2 every 4 weeks 6 cycles followed by Rituxan maintenance every 2 months 2 years     11/03/2014 Imaging    midpoint of chemotherapy: Moderate improvement in the periaortic lymphadenopathy without complete resolution     01/16/2015 PET scan     interval decrease in size and metabolic activity of the large left retroperitoneal mass , no increase in activity, size 10 mm decreased from 48 mm  SUV 1.9 , no additional hypermetabolic activity      CHIEF COMPLIANT: Rituxan maintenance for follicular lymphoma  INTERVAL HISTORY: Selena [name] is a 80 year old with above-mentioned history of breast cancer and follicular lymphoma. The follicular lymphoma was treated with bendamustine and Rituxan and is currently on Rituxan maintenance. She appears to be tolerating Rituxan reasonably well. She does not have any side effects directly from it. She has noticed that every time she has a mosquito bite it leads to papular eruption with  pus and it heals very slowly. This has been limiting her outdoor activities.  REVIEW OF SYSTEMS:   Constitutional: Denies fevers, chills or abnormal weight loss Eyes: Denies blurriness of vision Ears, nose, mouth, throat, and face: Denies mucositis or sore throat Respiratory: Denies cough, dyspnea or wheezes Cardiovascular: Denies palpitation, chest discomfort Gastrointestinal:  Denies nausea, heartburn or change in bowel habits Skin: Pustular eruption due to mosquito bites Lymphatics: Denies new lymphadenopathy or easy bruising Neurological:Denies numbness, tingling or new weaknesses Behavioral/Psych: Mood is stable, no new changes  Extremities: No lower extremity edema Breast:  denies any pain or lumps or nodules in either breasts All other systems were reviewed with the patient and are negative.  I have reviewed the past medical history, past surgical history, social history and family history with the patient and they are unchanged from previous note.  ALLERGIES:  is allergic to codeine and codeine phosphate.  MEDICATIONS:  Current Outpatient Prescriptions  Medication Sig Dispense Refill  . cholecalciferol (VITAMIN D) 1000 UNITS tablet Take 1,000 Units by mouth daily.    . diclofenac (VOLTAREN) 75 MG EC tablet TAKE 1 TABLET BY MOUTH TWICE DAILY 180 tablet 3  . HYDROcodone-acetaminophen (NORCO) 10-325 MG tablet Take 1 tablet by mouth every 6 (six) hours as needed. 60 tablet 0  . letrozole (FEMARA) 2.5 MG tablet Take 1 tablet (2.5 mg total) by mouth daily. 90 tablet 3  . lidocaine-prilocaine (EMLA) cream APP EXT AA ONCE UTD  3  .  meclizine (ANTIVERT) 25 MG tablet Take 1 tablet (25 mg total) by mouth every 6 (six) hours as needed for dizziness. 60 tablet 0  . UNABLE TO FIND Rx: L8000- Post Surgical Bras (Quantity: 6) [id number]- Silicone Breast Prosthesis (Quantity: 1) Dx: 174.9; Right Mastectomy 1 each 0   No current facility-administered medications for this visit.     PHYSICAL  EXAMINATION: ECOG PERFORMANCE STATUS: 1 - Symptomatic but completely ambulatory  Vitals:   08/27/15 1125  BP: (!) 160/71  Pulse: 76  Resp: 18  Temp: 98.3 F (36.8 C)   Filed Weights   08/27/15 1125  Weight: 203 lb (92.1 kg)    GENERAL:alert, no distress and comfortable SKIN: 3 papular pustular lesions with erythematous bases EYES: normal, Conjunctiva are pink and non-injected, sclera clear OROPHARYNX:no exudate, no erythema and lips, buccal mucosa, and tongue normal  NECK: supple, thyroid normal size, non-tender, without nodularity LYMPH:  no palpable lymphadenopathy in the cervical, axillary or inguinal LUNGS: clear to auscultation and percussion with normal breathing effort HEART: regular rate & rhythm and no murmurs and no lower extremity edema ABDOMEN:abdomen soft, non-tender and normal bowel sounds MUSCULOSKELETAL:no cyanosis of digits and no clubbing  NEURO: alert & oriented x 3 with fluent speech, no focal motor/sensory deficits EXTREMITIES: No lower extremity edema  LABORATORY DATA:  I have reviewed the data as listed   Chemistry      Component Value Date/Time   NA 144 08/27/2015 1054   K 4.0 08/27/2015 1054   CL 106 08/12/2013 0923   CL 107 04/05/2012 1253   CO2 26 08/27/2015 1054   BUN 15.1 08/27/2015 1054   CREATININE 0.7 08/27/2015 1054      Component Value Date/Time   CALCIUM 9.8 08/27/2015 1054   ALKPHOS 114 08/27/2015 1054   AST 48 (H) 08/27/2015 1054   ALT 60 (H) 08/27/2015 1054   BILITOT 0.54 08/27/2015 1054       Lab Results  Component Value Date   WBC 5.8 08/27/2015   HGB 14.4 08/27/2015   HCT 41.7 08/27/2015   MCV 96.8 08/27/2015   PLT 143 (L) 08/27/2015   NEUTROABS 4.0 08/27/2015     ASSESSMENT & PLAN:  Follicular 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 Retroperitoneal mass biopsy left side 02/54/2706: Follicular lymphoma low-grade grade 1-2, flow cytometry positive for CD10, 20, 3, 10, 5, BCL-2, BCl-6, CD21, Ki-67 less  than 10%, stage IB (8X 4 cm mass)  Treatment plan:systemic chemotherapy with bendamustine and Rituxan 6 cycles ( From 08/13/2014 to 01/01/2015) followed by Rituxan maintenance every 2 months for 2 years  PET/CT scan 01/16/2015: Interval decrease in size and metabolic activity of the large left retroperitoneal mass , no increase in activity, size 10 mm decreased from 48 mm SUV 1.9 , no additional hypermetabolic activity  Plan: 1. Continue maintenance therapy with Rituxan every 2 months for 2 years Start 02/26/2015. 2. Return to clinic with Rituxan maintenance #5 in Oct 2017  Breast cancer of upper-outer quadrant of right female breast ([place]) Retroperitoneal mass biopsy left side 23/76/2831: Follicular lymphoma low-grade grade 1-2, flow cytometry positive for CD10, 20, 3, 10, 5, BCL-2, BCl-6, CD21, Ki-67 less than 10%, stage IB (8X 4 cm mass)  Treatment plan:systemic chemotherapy with bendamustine and Rituxan 6 cycles ( From 08/13/2014 to 01/01/2015) followed by Rituxan maintenance every 2 months for 2 years  PET/CT scan 01/16/2015: Interval decrease in size and metabolic activity of the large left retroperitoneal mass , no increase in activity, size 10  mm decreased from 48 mm SUV 1.9 , no additional hypermetabolic activity  Plan: 1. Continue maintenance therapy with Rituxan every 2 months for 2 years Start 02/26/2015. 2. Return to clinic with Rituxan maintenance #5 in Oct 2017  Breast cancer of upper-outer quadrant of right female breast ([place]) Right breast cancer: Right mastectomy 04/14/2011: 2 foci of invasive ductal carcinoma 0.2 cm, grade 1 with background extensive DCIS, 0/5 lymph nodes negative,focus 1: ER 94%, PR 100%, Ki-67 5%, HER-2 negative: Focus to ER 100% PR 6% Ki-67 29% HER-2 negative Currently on Femara 2.5 mg daily Tolerating Femara extremely well.  Current treatment: continue with Rituxan until April 2019  Papular skin rash with pustular changes were  due to mosquito bites: It could be related to the fact that she had prior chemotherapy. There is not a whole lot that we can do about it.  No orders of the defined types were placed in this encounter.  The patient has a good understanding of the overall plan. she agrees with it. she will call with any problems that may develop before the next visit here.   Rulon Eisenmenger, MD 08/27/15

## 2015-08-27 NOTE — Patient Instructions (Signed)
[facility] Discharge Instructions for Patients Receiving Chemotherapy  Today you received the following chemotherapy agents: Rituxan  To help prevent nausea and vomiting after your treatment, we encourage you to take your nausea medication as prescribed by your physician.   If you develop nausea and vomiting that is not controlled by your nausea medication, call the clinic.   BELOW ARE SYMPTOMS THAT SHOULD BE REPORTED IMMEDIATELY:  *FEVER GREATER THAN 100.5 F  *CHILLS WITH OR WITHOUT FEVER  NAUSEA AND VOMITING THAT IS NOT CONTROLLED WITH YOUR NAUSEA MEDICATION  *UNUSUAL SHORTNESS OF BREATH  *UNUSUAL BRUISING OR BLEEDING  TENDERNESS IN MOUTH AND THROAT WITH OR WITHOUT PRESENCE OF ULCERS  *URINARY PROBLEMS  *BOWEL PROBLEMS  UNUSUAL RASH Items with * indicate a potential emergency and should be followed up as soon as possible.  Feel free to call the clinic you have any questions or concerns. The clinic phone number is [phone].  Please show the CHEMO ALERT CARD at check-in to the Emergency Department and triage nurse.   

## 2015-09-03 ENCOUNTER — Ambulatory Visit: Payer: Medicare Other | Admitting: Hematology and Oncology

## 2015-09-03 ENCOUNTER — Other Ambulatory Visit: Payer: Medicare Other

## 2015-10-25 ENCOUNTER — Encounter (HOSPITAL_COMMUNITY): Payer: Self-pay | Admitting: *Deleted

## 2015-10-25 ENCOUNTER — Ambulatory Visit (HOSPITAL_COMMUNITY)
Admission: EM | Admit: 2015-10-25 | Discharge: 2015-10-25 | Disposition: A | Discharge status: Home / Self Care | Payer: Medicare Other | Attending: Family Medicine | Admitting: Family Medicine

## 2015-10-25 DIAGNOSIS — S0502XA Injury of conjunctiva and corneal abrasion without foreign body, left eye, initial encounter: Secondary | ICD-10-CM

## 2015-10-25 MED ORDER — TETRACAINE HCL 0.5 % OP SOLN
OPHTHALMIC | Status: AC
  Filled 2015-10-25: qty 2

## 2015-10-25 MED ORDER — FLUORESCEIN SODIUM 1 MG OP STRP
ORAL_STRIP | OPHTHALMIC | Status: AC
  Filled 2015-10-25: qty 1

## 2015-10-25 MED ORDER — POLYMYXIN B-TRIMETHOPRIM 10000-0.1 UNIT/ML-% OP SOLN: 2.0000 [drp] | OPHTHALMIC | 0 refills | Status: DC

## 2015-10-25 NOTE — ED Triage Notes (Signed)
Reports mowing lawn yesterday; noticed some debris near left eye last night while washing face.  Has sensation of foreign body in left eye.  C/O burning, redness, irriation, slightly increased blurred vision.

## 2015-10-25 NOTE — Discharge Instructions (Signed)
Please follow-up with eye doctor

## 2015-10-25 NOTE — ED Provider Notes (Signed)
CSN: [id number]     Arrival date & time 10/25/15  1205 History   None    Chief Complaint  Patient presents with  . Eye Problem   (Consider location/radiation/quality/duration/timing/severity/associated sxs/prior Treatment) 80 y.o. female presents with left eye pain X 1 day. Patient states that she was mowing her lawn on a riding lawn mover when debris got in her eye. Patient states that she cleaned her eye and there was a small pebble. Patient reports a decrease in her vision but states that "it is probably due to anxiety. Patient denies any light sensativity. Condition is acute in nature. Condition is made better by nothing. Condition is made worse by nothing. Patient denies any treatemnt prior to there arrival at this facility.        Past Medical History:  Diagnosis Date  . Anemia    PMH  . Atrial fibrillation ([place]) 01/01/2007  . BACK PAIN, UPPER 07/09/2008  . Breast cancer, stage 1 ([place])   . Bruises easily   . Cataract   . Complication of anesthesia   . Diverticulitis   . DIVERTICULOSIS, COLON 12/26/2006  . Follicular non-Hodgkin's lymphoma ([place])   . Goiter    multi-nodular  . Hx of colonoscopy 2009  . LIVER FUNCTION TESTS, ABNORMAL, HX OF 12/26/2007  . Memory loss 05/16/2007  . OSTEOARTHRITIS 12/26/2006   takes Diclofenac daily but has stopped for surgery  . PONV (postoperative nausea and vomiting)   . Skin cancer   . VERTIGO 09/07/2009  . Vertigo    hx of;had Phenergan prn   . Wears dentures   . Wears glasses   . Wears hearing aid    bilateral   Past Surgical History:  Procedure Laterality Date  . ABDOMINAL HYSTERECTOMY    . ABLATION OF DYSRHYTHMIC FOCUS  2009  . APPENDECTOMY    . BREAST BIOPSY  2013  . BREAST RECONSTRUCTION  04/14/2011   Procedure: BREAST RECONSTRUCTION;  Surgeon: Crissie Reese, MD;  Location: [place];  Service: Plastics;  Laterality: Right;  Placement of Right Breast Tissue Expander with  use of Flex HD for Breast Reconstruction  . BREAST SURGERY      right sm, snbx  . CATARACT EXTRACTION W/ INTRAOCULAR LENS  IMPLANT, BILATERAL  2010   bilateral  . COLONOSCOPY    . COLONOSCOPY    . KNEE SURGERY  2004/2010   arthroscopic/ bilateral knee replacements  . PORTACATH PLACEMENT Right 08/11/2014   Procedure: INSERTION PORT-A-CATH WITH ULTRASOUND;  Surgeon: Rolm Bookbinder, MD;  Location: [place];  Service: General;  Laterality: Right;  . [place]   right  . tmi  1998  . TONSILLECTOMY     Family History  Problem Relation Age of Onset  . Colon cancer Mother   . Cancer Mother     colon  . Colon cancer Maternal Aunt   . Cancer Maternal Uncle   . Prostate cancer Maternal Uncle   . Prostate cancer Maternal Uncle   . Other Maternal Uncle   . Cancer Sister     kidney  . Cancer Brother     lymph node  . Anesthesia problems Neg Hx   . Hypotension Neg Hx   . Malignant hyperthermia Neg Hx   . Pseudochol deficiency Neg Hx    Social History  Substance Use Topics  . Smoking status: Former Research scientist (life sciences)  . Smokeless tobacco: Never Used  . Alcohol use No   OB History    No data available  Review of Systems  Constitutional: Negative.   HENT:       Pain to left eye  Neurological: Positive for headaches.    Allergies  Codeine and Codeine phosphate  Home Medications   Prior to Admission medications   Medication Sig Start Date End Date Taking? Authorizing Provider  cholecalciferol (VITAMIN D) 1000 UNITS tablet Take 1,000 Units by mouth daily.   Yes Historical Provider, MD  diclofenac (VOLTAREN) 75 MG EC tablet TAKE 1 TABLET BY MOUTH TWICE DAILY 01/06/15  Yes Marletta Lor, MD  HYDROcodone-acetaminophen [facility]) 10-325 MG tablet Take 1 tablet by mouth every 6 (six) hours as needed. 04/30/15  Yes Nicholas Lose, MD  letrozole (FEMARA) 2.5 MG tablet Take 1 tablet (2.5 mg total) by mouth daily. 08/27/15  Yes Nicholas Lose, MD  lidocaine-prilocaine (EMLA) cream APP EXT AA ONCE UTD 01/02/15   Historical Provider, MD   meclizine (ANTIVERT) 25 MG tablet Take 1 tablet (25 mg total) by mouth every 6 (six) hours as needed for dizziness. 02/04/15   Marletta Lor, MD  trimethoprim-polymyxin b (POLYTRIM) ophthalmic solution Place 2 drops into the left eye every 4 (four) hours. 10/25/15   Jacqualine Mau, NP  UNABLE TO FIND Rx: [phone]- Post Surgical Bras (Quantity: 6) [id number]- Silicone Breast Prosthesis (Quantity: 1) Dx: 174.9; Right Mastectomy 12/07/12   Neldon Mc, MD   Meds Ordered and Administered this Visit  Medications - No data to display  BP 173/78 (BP Location: Left Arm)   Pulse 66   Temp 97.8 F (36.6 C) (Oral)   Resp 20   SpO2 100%  No data found.   Physical Exam  Constitutional: She is oriented to person, place, and time. She appears well-developed and well-nourished.  HENT:  Head: Normocephalic and atraumatic.  Eyes: Pupils are equal, round, and reactive to light.  conjuctive red to left eye.   Neck: Normal range of motion.  Pulmonary/Chest: Effort normal.  Musculoskeletal: Normal range of motion.  Neurological: She is alert and oriented to person, place, and time.  Skin: Skin is warm.  Psychiatric: She has a normal mood and affect.  Nursing note and vitals reviewed.   Urgent Care Course   Clinical Course    Procedures (including critical care time)  Labs Review Labs Reviewed - No data to display  Imaging Review No results found.   Visual Acuity Review  Right Eye Distance: 20/50 with correction Left Eye Distance: 20/50 with correction Bilateral Distance: 20/50 with correction  Right Eye Near:   Left Eye Near:    Bilateral Near:     tetracaine applied to left eye that was visualized under black light. abrasion  noted to lateral aspect of eye.     MDM   1. Abrasion of left cornea, initial encounter        Jacqualine Mau, NP 10/25/15 1327

## 2015-10-26 ENCOUNTER — Ambulatory Visit (HOSPITAL_BASED_OUTPATIENT_CLINIC_OR_DEPARTMENT_OTHER): Payer: Medicare Other

## 2015-10-26 ENCOUNTER — Encounter: Payer: Self-pay | Admitting: Hematology and Oncology

## 2015-10-26 ENCOUNTER — Ambulatory Visit (HOSPITAL_BASED_OUTPATIENT_CLINIC_OR_DEPARTMENT_OTHER): Payer: Medicare Other | Admitting: Hematology and Oncology

## 2015-10-26 ENCOUNTER — Other Ambulatory Visit (HOSPITAL_BASED_OUTPATIENT_CLINIC_OR_DEPARTMENT_OTHER): Payer: Medicare Other

## 2015-10-26 VITALS — BP 130/64 | HR 62 | Temp 97.7°F | Resp 18

## 2015-10-26 DIAGNOSIS — M545 Low back pain: Secondary | ICD-10-CM | POA: Diagnosis not present

## 2015-10-26 DIAGNOSIS — C8203 Follicular lymphoma grade I, intra-abdominal lymph nodes: Secondary | ICD-10-CM

## 2015-10-26 DIAGNOSIS — Z170 Estrogen receptor positive status [ER+]: Secondary | ICD-10-CM

## 2015-10-26 DIAGNOSIS — C50411 Malignant neoplasm of upper-outer quadrant of right female breast: Secondary | ICD-10-CM

## 2015-10-26 DIAGNOSIS — Z5112 Encounter for antineoplastic immunotherapy: Secondary | ICD-10-CM

## 2015-10-26 LAB — CBC WITH DIFFERENTIAL/PLATELET
BASO%: 1 % (ref 0.0–2.0)
Basophils Absolute: 0.1 10*3/uL (ref 0.0–0.1)
EOS%: 6.4 % (ref 0.0–7.0)
Eosinophils Absolute: 0.4 10*3/uL (ref 0.0–0.5)
HCT: 44.1 % (ref 34.8–46.6)
HGB: 15.2 g/dL (ref 11.6–15.9)
LYMPH%: 12.4 % — AB (ref 14.0–49.7)
MCH: 33.6 pg (ref 25.1–34.0)
MCHC: 34.5 g/dL (ref 31.5–36.0)
MCV: 97.4 fL (ref 79.5–101.0)
MONO#: 0.8 10*3/uL (ref 0.1–0.9)
MONO%: 12.6 % (ref 0.0–14.0)
NEUT#: 4.1 10*3/uL (ref 1.5–6.5)
NEUT%: 67.6 % (ref 38.4–76.8)
Platelets: 141 10*3/uL — ABNORMAL LOW (ref 145–400)
RBC: 4.53 10*6/uL (ref 3.70–5.45)
RDW: 12.8 % (ref 11.2–14.5)
WBC: 6.1 10*3/uL (ref 3.9–10.3)
lymph#: 0.8 10*3/uL — ABNORMAL LOW (ref 0.9–3.3)

## 2015-10-26 LAB — COMPREHENSIVE METABOLIC PANEL
ALT: 61 U/L — AB (ref 0–55)
ANION GAP: 10 meq/L (ref 3–11)
AST: 39 U/L — ABNORMAL HIGH (ref 5–34)
Albumin: 3.8 g/dL (ref 3.5–5.0)
Alkaline Phosphatase: 119 U/L (ref 40–150)
BILIRUBIN TOTAL: 0.74 mg/dL (ref 0.20–1.20)
BUN: 20.5 mg/dL (ref 7.0–26.0)
CALCIUM: 10.1 mg/dL (ref 8.4–10.4)
CO2: 27 mEq/L (ref 22–29)
CREATININE: 0.7 mg/dL (ref 0.6–1.1)
Chloride: 107 mEq/L (ref 98–109)
EGFR: 79 mL/min/{1.73_m2} — ABNORMAL LOW (ref >90)
Glucose: 98 mg/dl (ref 70–140)
Potassium: 4.4 mEq/L (ref 3.5–5.1)
Sodium: 144 mEq/L (ref 136–145)
TOTAL PROTEIN: 6.9 g/dL (ref 6.4–8.3)

## 2015-10-26 MED ORDER — ACETAMINOPHEN 325 MG PO TABS
650.0000 mg | ORAL_TABLET | Freq: Once | ORAL | Status: AC
  Administered 2015-10-26: 650 mg via ORAL

## 2015-10-26 MED ORDER — SODIUM CHLORIDE 0.9 % IV SOLN
375.0000 mg/m2 | Freq: Once | INTRAVENOUS | Status: AC
  Administered 2015-10-26: 800 mg via INTRAVENOUS
  Filled 2015-10-26: qty 50

## 2015-10-26 MED ORDER — HEPARIN SOD (PORK) LOCK FLUSH 100 UNIT/ML IV SOLN
500.0000 [IU] | Freq: Once | INTRAVENOUS | Status: AC | PRN
  Administered 2015-10-26: 500 [IU]
  Filled 2015-10-26: qty 5

## 2015-10-26 MED ORDER — SODIUM CHLORIDE 0.9 % IV SOLN
Freq: Once | INTRAVENOUS | Status: AC
  Administered 2015-10-26: 12:00:00 via INTRAVENOUS

## 2015-10-26 MED ORDER — SODIUM CHLORIDE 0.9 % IJ SOLN
10.0000 mL | INTRAMUSCULAR | Status: DC | PRN
  Administered 2015-10-26: 10 mL
  Filled 2015-10-26: qty 10

## 2015-10-26 MED ORDER — ACETAMINOPHEN 325 MG PO TABS
ORAL_TABLET | ORAL | Status: AC
  Filled 2015-10-26: qty 2

## 2015-10-26 MED ORDER — DIPHENHYDRAMINE HCL 25 MG PO CAPS
ORAL_CAPSULE | ORAL | Status: AC
  Filled 2015-10-26: qty 2

## 2015-10-26 MED ORDER — DIPHENHYDRAMINE HCL 25 MG PO CAPS
50.0000 mg | ORAL_CAPSULE | Freq: Once | ORAL | Status: AC
  Administered 2015-10-26: 50 mg via ORAL

## 2015-10-26 NOTE — Assessment & Plan Note (Signed)
Right breast cancer: Right mastectomy 04/14/2011: 2 foci of invasive ductal carcinoma 0.2 cm, grade 1 with background extensive DCIS, 0/5 lymph nodes negative,focus 1: ER 94%, PR 100%, Ki-67 5%, HER-2 negative: Focus to ER 100% PR 6% Ki-67 29% HER-2 negative Currently on Femara 2.5 mg daily Tolerating Femara extremely well.  Breast Cancer Surveillance: 1. Breast exam 10/26/2015: Right mastectomy 2. Mammogram 06/29/2015 No abnormalities. Postsurgical changes. Breast Density Category B. I recommended that she get 3-D mammograms for surveillance. Discussed the differences between different breast density categories.

## 2015-10-26 NOTE — Progress Notes (Signed)
Patient Care Team: Marletta Lor, MD as PCP - General  DIAGNOSIS: Breast cancer of upper-outer quadrant of right female breast [facility])   Staging form: Breast, AJCC 7th Edition   - Clinical: Tis (DCIS), NX, cM0 - Unsigned  SUMMARY OF ONCOLOGIC HISTORY:   Breast cancer of upper-outer quadrant of right female breast ([place])   04/14/2011 Surgery    Right mastectomy: 2 foci of invasive ductal carcinoma 0.2 cm, grade 1 with background extensive DCIS, 0/5 lymph nodes negative,focus 1: ER 94%, PR 100%, Ki-67 5%, HER-2 negative: Focus to ER 100%BR 6% Ki-67 29% HER-2 negative      05/02/2011 -  Anti-estrogen oral therapy    letrozole 2.5 mg daily       Follicular 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 ([name])   07/24/2014 Initial Diagnosis    Retroperitoneal mass biopsy left side: Follicular lymphoma low-grade grade 1-2, flow cytometry positive for CD10, 20, 3, 10, 5, BCL-2, BCl-6, CD21, Ki-67 less than 10%      08/13/2014 -  Chemotherapy    Bendamustine and Rituxan day 1, 2 every 4 weeks 6 cycles followed by Rituxan maintenance every 2 months 2 years      11/03/2014 Imaging    midpoint of chemotherapy: Moderate improvement in the periaortic lymphadenopathy without complete resolution      01/16/2015 PET scan     interval decrease in size and metabolic activity of the large left retroperitoneal mass , no increase in activity, size 10 mm decreased from 48 mm  SUV 1.9 , no additional hypermetabolic activity       CHIEF COMPLIANT: Follicular lymphoma Rituxan maintenance  INTERVAL HISTORY: Selena [name] is a 80 year old with above-mentioned history of follicular lymphoma and breast cancer. She is currently on Rituxan maintenance and tolerating it very well. She reports no new complaints or problems. She does have low back pain related to bending forward and doing activities site that. She denies any nausea vomiting. She denies any fevers chills night sweats or weight loss. She is  also on Femara for breast cancer. She is going to finish 5 years of therapy in April 2018. She denies any lumps or nodules in the breast.  REVIEW OF SYSTEMS:   Constitutional: Denies fevers, chills or abnormal weight loss Eyes: Denies blurriness of vision Ears, nose, mouth, throat, and face: Denies mucositis or sore throat Respiratory: Denies cough, dyspnea or wheezes Cardiovascular: Denies palpitation, chest discomfort Gastrointestinal:  Denies nausea, heartburn or change in bowel habits Skin: Denies abnormal skin rashes Lymphatics: Denies new lymphadenopathy or easy bruising Neurological:Denies numbness, tingling or new weaknesses Behavioral/Psych: Mood is stable, no new changes  Extremities: No lower extremity edema Breast:  denies any pain or lumps or nodules in either breasts All other systems were reviewed with the patient and are negative.  I have reviewed the past medical history, past surgical history, social history and family history with the patient and they are unchanged from previous note.  ALLERGIES:  is allergic to codeine and codeine phosphate.  MEDICATIONS:  Current Outpatient Prescriptions  Medication Sig Dispense Refill  . cholecalciferol (VITAMIN D) 1000 UNITS tablet Take 1,000 Units by mouth daily.    . diclofenac (VOLTAREN) 75 MG EC tablet TAKE 1 TABLET BY MOUTH TWICE DAILY 180 tablet 3  . HYDROcodone-acetaminophen (NORCO) 10-325 MG tablet Take 1 tablet by mouth every 6 (six) hours as needed. 60 tablet 0  . letrozole (FEMARA) 2.5 MG tablet Take 1 tablet (2.5 mg total) by mouth  daily. 90 tablet 3  . lidocaine-prilocaine (EMLA) cream APP EXT AA ONCE UTD  3  . meclizine (ANTIVERT) 25 MG tablet Take 1 tablet (25 mg total) by mouth every 6 (six) hours as needed for dizziness. 60 tablet 0  . trimethoprim-polymyxin b (POLYTRIM) ophthalmic solution Place 2 drops into the left eye every 4 (four) hours. 10 mL 0  . UNABLE TO FIND Rx: [id number]- Post Surgical Bras (Quantity:  6) K9381- Silicone Breast Prosthesis (Quantity: 1) Dx: 174.9; Right Mastectomy 1 each 0   No current facility-administered medications for this visit.     PHYSICAL EXAMINATION: ECOG PERFORMANCE STATUS: 0 - Asymptomatic  Vitals:   10/26/15 1039  BP: (!) 163/68  Pulse: 64  Resp: 18  Temp: 98 F (36.7 C)   Filed Weights   10/26/15 1039  Weight: 202 lb 6.4 oz (91.8 kg)    GENERAL:alert, no distress and comfortable SKIN: skin color, texture, turgor are normal, no rashes or significant lesions EYES: normal, Conjunctiva are pink and non-injected, sclera clear OROPHARYNX:no exudate, no erythema and lips, buccal mucosa, and tongue normal  NECK: supple, thyroid normal size, non-tender, without nodularity LYMPH:  no palpable lymphadenopathy in the cervical, axillary or inguinal LUNGS: clear to auscultation and percussion with normal breathing effort HEART: regular rate & rhythm and no murmurs and no lower extremity edema ABDOMEN:abdomen soft, non-tender and normal bowel sounds MUSCULOSKELETAL:no cyanosis of digits and no clubbing  NEURO: alert & oriented x 3 with fluent speech, no focal motor/sensory deficits EXTREMITIES: No lower extremity edema BREAST: No palpable masses or nodules in either right or left breasts. No palpable axillary supraclavicular or infraclavicular adenopathy no breast tenderness or nipple discharge. (exam performed in the presence of a chaperone)  LABORATORY DATA:  I have reviewed the data as listed   Chemistry      Component Value Date/Time   NA 144 10/26/2015 1007   K 4.4 10/26/2015 1007   CL 106 08/12/2013 0923   CL 107 04/05/2012 1253   CO2 27 10/26/2015 1007   BUN 20.5 10/26/2015 1007   CREATININE 0.7 10/26/2015 1007      Component Value Date/Time   CALCIUM 10.1 10/26/2015 1007   ALKPHOS 119 10/26/2015 1007   AST 39 (H) 10/26/2015 1007   ALT 61 (H) 10/26/2015 1007   BILITOT 0.74 10/26/2015 1007       Lab Results  Component Value Date    WBC 6.1 10/26/2015   HGB 15.2 10/26/2015   HCT 44.1 10/26/2015   MCV 97.4 10/26/2015   PLT 141 (L) 10/26/2015   NEUTROABS 4.1 10/26/2015     ASSESSMENT & PLAN:  Follicular 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 Retroperitoneal mass biopsy left side 82/99/3716: Follicular lymphoma low-grade grade 1-2, flow cytometry positive for CD10, 20, 3, 10, 5, BCL-2, BCl-6, CD21, Ki-67 less than 10%, stage IB (8X 4 cm mass)  Treatment plan:systemic chemotherapy with bendamustine and Rituxan 6 cycles ( From 08/13/2014 to 01/01/2015) followed by Rituxan maintenance every 2 months for 2 years  PET/CT scan 01/16/2015: Interval decrease in size and metabolic activity of the large left retroperitoneal mass , no increase in activity, size 10 mm decreased from 48 mm SUV 1.9 , no additional hypermetabolic activity ------------------------------------------------------------------------------------------------------------------------------------------ Plan: 1. Continue maintenance therapy with Rituxan every 2 months for 2 years Started 02/26/2015. 2. Return to clinic with Rituxan maintenance #6 in  Dec 2017  Breast cancer of upper-outer quadrant of right female breast [facility]) Right breast cancer: Right mastectomy 04/14/2011:  2 foci of invasive ductal carcinoma 0.2 cm, grade 1 with background extensive DCIS, 0/5 lymph nodes negative,focus 1: ER 94%, PR 100%, Ki-67 5%, HER-2 negative: Focus to ER 100% PR 6% Ki-67 29% HER-2 negative Currently on Femara 2.5 mg daily Tolerating Femara extremely well.  Breast Cancer Surveillance: 1. Breast exam 10/26/2015: Right mastectomy 2. Mammogram 06/29/2015 No abnormalities. Postsurgical changes. Breast Density Category B. I recommended that she get 3-D mammograms for surveillance. Discussed the differences between different breast density categories.    No orders of the defined types were placed in this encounter.  The patient has a good understanding  of the overall plan. she agrees with it. she will call with any problems that may develop before the next visit here.   Rulon Eisenmenger, MD 10/26/15

## 2015-10-26 NOTE — Patient Instructions (Signed)
[facility] Discharge Instructions for Patients Receiving Chemotherapy  Today you received the following chemotherapy agents Rituxan To help prevent nausea and vomiting after your treatment, we encourage you to take your nausea medication as prescribed.  If you develop nausea and vomiting that is not controlled by your nausea medication, call the clinic.   BELOW ARE SYMPTOMS THAT SHOULD BE REPORTED IMMEDIATELY:  *FEVER GREATER THAN 100.5 F  *CHILLS WITH OR WITHOUT FEVER  NAUSEA AND VOMITING THAT IS NOT CONTROLLED WITH YOUR NAUSEA MEDICATION  *UNUSUAL SHORTNESS OF BREATH  *UNUSUAL BRUISING OR BLEEDING  TENDERNESS IN MOUTH AND THROAT WITH OR WITHOUT PRESENCE OF ULCERS  *URINARY PROBLEMS  *BOWEL PROBLEMS  UNUSUAL RASH Items with * indicate a potential emergency and should be followed up as soon as possible.  Feel free to call the clinic you have any questions or concerns. The clinic phone number is [phone].  Please show the CHEMO ALERT CARD at check-in to the Emergency Department and triage nurse.   

## 2015-10-26 NOTE — Assessment & Plan Note (Signed)
Follicular 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 Retroperitoneal mass biopsy left side 48/14/4392: Follicular lymphoma low-grade grade 1-2, flow cytometry positive for CD10, 20, 3, 10, 5, BCL-2, BCl-6, CD21, Ki-67 less than 10%, stage IB (8X 4 cm mass)  Treatment plan:systemic chemotherapy with bendamustine and Rituxan 6 cycles ( From 08/13/2014 to 01/01/2015) followed by Rituxan maintenance every 2 months for 2 years  PET/CT scan 01/16/2015: Interval decrease in size and metabolic activity of the large left retroperitoneal mass , no increase in activity, size 10 mm decreased from 48 mm SUV 1.9 , no additional hypermetabolic activity ------------------------------------------------------------------------------------------------------------------------------------------ Plan: 1. Continue maintenance therapy with Rituxan every 2 months for 2 years Started 02/26/2015. 2. Return to clinic with Rituxan maintenance #6 in Dec 2017

## 2015-10-28 ENCOUNTER — Telehealth: Payer: Self-pay

## 2015-10-28 ENCOUNTER — Telehealth: Payer: Self-pay | Admitting: *Deleted

## 2015-10-28 NOTE — Telephone Encounter (Signed)
"  I need to know if the nurse has received the tapes and pictures.  Call transferred ext [phone].

## 2015-10-28 NOTE — Telephone Encounter (Signed)
Called pt back to let her know she could pick up the requested CD of images from 07/24/14 to present any time this afternoon.  Pt appreciated information and is without further questions or concerns at time of call.

## 2015-12-24 NOTE — Assessment & Plan Note (Signed)
Retroperitoneal mass biopsy left side 25/24/7998: Follicular lymphoma low-grade grade 1-2, flow cytometry positive for CD10, 20, 3, 10, 5, BCL-2, BCl-6, CD21, Ki-67 less than 10%, stage IB (8X 4 cm mass)  Treatment plan:systemic chemotherapy with bendamustine and Rituxan 6 cycles ( From 08/13/2014 to 01/01/2015) followed by Rituxan maintenance every 2 months for 2 years  PET/CT scan 01/16/2015: Interval decrease in size and metabolic activity of the large left retroperitoneal mass , no increase in activity, size 10 mm decreased from 48 mm SUV 1.9 , no additional hypermetabolic activity ------------------------------------------------------------------------------------------------------------------------------------------ Plan: 1. Continue maintenance therapy with Rituxan every 2 months for 2 years Started 02/26/2015. 2. Return to clinic with Rituxan maintenance #6in  Dec 2017  Breast cancer of upper-outer quadrant of right female breast [facility]) Right breast cancer: Right mastectomy 04/14/2011: 2 foci of invasive ductal carcinoma 0.2 cm, grade 1 with background extensive DCIS, 0/5 lymph nodes negative,focus 1: ER 94%, PR 100%, Ki-67 5%, HER-2 negative: Focus to ER 100% PR 6% Ki-67 29% HER-2 negative Currently on Femara 2.5 mg daily Tolerating Femara extremely well.  Breast Cancer Surveillance: 1. Breast exam 12/25/2015: Right mastectomy 2. Mammogram 06/29/2015 No abnormalities. Postsurgical changes. Breast Density Category B. I recommended that she get 3-D mammograms for surveillance. Discussed the differences between different breast density categories.

## 2015-12-25 ENCOUNTER — Ambulatory Visit (HOSPITAL_BASED_OUTPATIENT_CLINIC_OR_DEPARTMENT_OTHER): Payer: Medicare Other

## 2015-12-25 ENCOUNTER — Encounter: Payer: Self-pay | Admitting: Hematology and Oncology

## 2015-12-25 ENCOUNTER — Other Ambulatory Visit (HOSPITAL_BASED_OUTPATIENT_CLINIC_OR_DEPARTMENT_OTHER): Payer: Medicare Other

## 2015-12-25 ENCOUNTER — Ambulatory Visit (HOSPITAL_BASED_OUTPATIENT_CLINIC_OR_DEPARTMENT_OTHER): Payer: Medicare Other | Admitting: Hematology and Oncology

## 2015-12-25 VITALS — BP 153/64 | HR 69 | Temp 97.9°F | Resp 18

## 2015-12-25 DIAGNOSIS — C8203 Follicular lymphoma grade I, intra-abdominal lymph nodes: Secondary | ICD-10-CM | POA: Diagnosis not present

## 2015-12-25 DIAGNOSIS — M545 Low back pain: Secondary | ICD-10-CM | POA: Diagnosis not present

## 2015-12-25 DIAGNOSIS — C50411 Malignant neoplasm of upper-outer quadrant of right female breast: Secondary | ICD-10-CM

## 2015-12-25 DIAGNOSIS — Z5112 Encounter for antineoplastic immunotherapy: Secondary | ICD-10-CM | POA: Diagnosis not present

## 2015-12-25 LAB — CBC WITH DIFFERENTIAL/PLATELET
BASO%: 1.1 % (ref 0.0–2.0)
Basophils Absolute: 0.1 10*3/uL (ref 0.0–0.1)
EOS ABS: 0.3 10*3/uL (ref 0.0–0.5)
EOS%: 5 % (ref 0.0–7.0)
HEMATOCRIT: 43 % (ref 34.8–46.6)
HGB: 14.3 g/dL (ref 11.6–15.9)
LYMPH#: 0.5 10*3/uL — AB (ref 0.9–3.3)
LYMPH%: 8.6 % — ABNORMAL LOW (ref 14.0–49.7)
MCH: 32.7 pg (ref 25.1–34.0)
MCHC: 33.4 g/dL (ref 31.5–36.0)
MCV: 98 fL (ref 79.5–101.0)
MONO#: 0.7 10*3/uL (ref 0.1–0.9)
MONO%: 11.3 % (ref 0.0–14.0)
NEUT%: 74 % (ref 38.4–76.8)
NEUTROS ABS: 4.6 10*3/uL (ref 1.5–6.5)
PLATELETS: 155 10*3/uL (ref 145–400)
RBC: 4.38 10*6/uL (ref 3.70–5.45)
RDW: 12.9 % (ref 11.2–14.5)
WBC: 6.3 10*3/uL (ref 3.9–10.3)

## 2015-12-25 LAB — COMPREHENSIVE METABOLIC PANEL
ALT: 52 U/L (ref 0–55)
ANION GAP: 10 meq/L (ref 3–11)
AST: 42 U/L — ABNORMAL HIGH (ref 5–34)
Albumin: 3.7 g/dL (ref 3.5–5.0)
Alkaline Phosphatase: 110 U/L (ref 40–150)
BUN: 17.1 mg/dL (ref 7.0–26.0)
CALCIUM: 9.7 mg/dL (ref 8.4–10.4)
CHLORIDE: 108 meq/L (ref 98–109)
CO2: 27 meq/L (ref 22–29)
Creatinine: 0.8 mg/dL (ref 0.6–1.1)
EGFR: 67 mL/min/{1.73_m2} — AB (ref >90)
Glucose: 92 mg/dl (ref 70–140)
POTASSIUM: 4.2 meq/L (ref 3.5–5.1)
Sodium: 144 mEq/L (ref 136–145)
Total Bilirubin: 0.84 mg/dL (ref 0.20–1.20)
Total Protein: 6.4 g/dL (ref 6.4–8.3)

## 2015-12-25 MED ORDER — ACETAMINOPHEN 325 MG PO TABS
ORAL_TABLET | ORAL | Status: AC
  Filled 2015-12-25: qty 2

## 2015-12-25 MED ORDER — DIPHENHYDRAMINE HCL 25 MG PO CAPS
50.0000 mg | ORAL_CAPSULE | Freq: Once | ORAL | Status: AC
  Administered 2015-12-25: 50 mg via ORAL

## 2015-12-25 MED ORDER — HEPARIN SOD (PORK) LOCK FLUSH 100 UNIT/ML IV SOLN
500.0000 [IU] | Freq: Once | INTRAVENOUS | Status: AC | PRN
  Administered 2015-12-25: 500 [IU]
  Filled 2015-12-25: qty 5

## 2015-12-25 MED ORDER — ACETAMINOPHEN 325 MG PO TABS
650.0000 mg | ORAL_TABLET | Freq: Once | ORAL | Status: AC
Start: 2015-12-25 — End: 2015-12-25
  Administered 2015-12-25: 650 mg via ORAL

## 2015-12-25 MED ORDER — SODIUM CHLORIDE 0.9 % IV SOLN
Freq: Once | INTRAVENOUS | Status: AC
  Administered 2015-12-25: 11:00:00 via INTRAVENOUS

## 2015-12-25 MED ORDER — SODIUM CHLORIDE 0.9 % IJ SOLN
10.0000 mL | INTRAMUSCULAR | Status: DC | PRN
  Administered 2015-12-25: 10 mL
  Filled 2015-12-25: qty 10

## 2015-12-25 MED ORDER — RITUXIMAB CHEMO INJECTION 500 MG/50ML
375.0000 mg/m2 | Freq: Once | INTRAVENOUS | Status: AC
  Administered 2015-12-25: 800 mg via INTRAVENOUS
  Filled 2015-12-25: qty 50

## 2015-12-25 MED ORDER — DIPHENHYDRAMINE HCL 25 MG PO CAPS
ORAL_CAPSULE | ORAL | Status: AC
  Filled 2015-12-25: qty 2

## 2015-12-25 NOTE — Progress Notes (Signed)
units is a recurrence so she is yet final p.m.   Patient Care Team: Marletta Lor, MD as PCP - General  DIAGNOSIS:  Encounter Diagnosis  Name Primary?  . Follicular 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 ([place])     SUMMARY OF ONCOLOGIC HISTORY:   Breast cancer of upper-outer quadrant of right female breast ([place])   04/14/2011 Surgery    Right mastectomy: 2 foci of invasive ductal carcinoma 0.2 cm, grade 1 with background extensive DCIS, 0/5 lymph nodes negative,focus 1: ER 94%, PR 100%, Ki-67 5%, HER-2 negative: Focus to ER 100%BR 6% Ki-67 29% HER-2 negative      05/02/2011 -  Anti-estrogen oral therapy    letrozole 2.5 mg daily       Follicular 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 ([name])   07/24/2014 Initial Diagnosis    Retroperitoneal mass biopsy left side: Follicular lymphoma low-grade grade 1-2, flow cytometry positive for CD10, 20, 3, 10, 5, BCL-2, BCl-6, CD21, Ki-67 less than 10%      08/13/2014 -  Chemotherapy    Bendamustine and Rituxan day 1, 2 every 4 weeks 6 cycles followed by Rituxan maintenance every 2 months 2 years      11/03/2014 Imaging    midpoint of chemotherapy: Moderate improvement in the periaortic lymphadenopathy without complete resolution      01/16/2015 PET scan     interval decrease in size and metabolic activity of the large left retroperitoneal mass , no increase in activity, size 10 mm decreased from 48 mm  SUV 1.9 , no additional hypermetabolic activity       CHIEF COMPLIANT: Follow-up of a right breast cancer and follicular lymphoma  INTERVAL HISTORY: Selena [name] is a69 year old with above-mentioned history of follicular lymphoma and breast cancer. She is currently on Rituxan maintenance and tolerating it very well. She reports no new complaints or problems. She does have low back pain related to bending forward and doing activities site that. She denies any nausea vomiting. She denies any fevers chills night sweats  or weight loss. She is also on Femara for breast cancer. She is going to finish 5 years of therapy in April 2018. She denies any lumps or nodules in the breast.  REVIEW OF SYSTEMS:   Constitutional: Denies fevers, chills or abnormal weight loss Eyes: Denies blurriness of vision Ears, nose, mouth, throat, and face: Denies mucositis or sore throat Respiratory: Denies cough, dyspnea or wheezes Cardiovascular: Denies palpitation, chest discomfort Gastrointestinal:  Denies nausea, heartburn or change in bowel habits Skin: Denies abnormal skin rashes Lymphatics: Denies new lymphadenopathy or easy bruising Neurological:Denies numbness, tingling or new weaknesses Behavioral/Psych: Mood is stable, no new changes  Extremities: No lower extremity edema Breast:  denies any pain or lumps or nodules in either breasts All other systems were reviewed with the patient and are negative.  I have reviewed the past medical history, past surgical history, social history and family history with the patient and they are unchanged from previous note.  ALLERGIES:  is allergic to codeine and codeine phosphate.  MEDICATIONS:  Current Outpatient Prescriptions  Medication Sig Dispense Refill  . cholecalciferol (VITAMIN D) 1000 UNITS tablet Take 1,000 Units by mouth daily.    . diclofenac (VOLTAREN) 75 MG EC tablet TAKE 1 TABLET BY MOUTH TWICE DAILY 180 tablet 3  . HYDROcodone-acetaminophen (NORCO) 10-325 MG tablet Take 1 tablet by mouth every 6 (six) hours as needed. 60 tablet 0  . letrozole (FEMARA) 2.5 MG tablet Take  1 tablet (2.5 mg total) by mouth daily. 90 tablet 3  . lidocaine-prilocaine (EMLA) cream APP EXT AA ONCE UTD  3  . meclizine (ANTIVERT) 25 MG tablet Take 1 tablet (25 mg total) by mouth every 6 (six) hours as needed for dizziness. 60 tablet 0  . trimethoprim-polymyxin b (POLYTRIM) ophthalmic solution Place 2 drops into the left eye every 4 (four) hours. 10 mL 0  . UNABLE TO FIND Rx: L8000- Post  Surgical Bras (Quantity: 6) [id number]- Silicone Breast Prosthesis (Quantity: 1) Dx: 174.9; Right Mastectomy 1 each 0   No current facility-administered medications for this visit.     PHYSICAL EXAMINATION: ECOG PERFORMANCE STATUS: 0 - Asymptomatic  Vitals:   12/25/15 0951  BP: (!) 157/83  Pulse: 72  Resp: 18  Temp: 98 F (36.7 C)   Filed Weights   12/25/15 0951  Weight: 201 lb 9.6 oz (91.4 kg)    GENERAL:alert, no distress and comfortable SKIN: skin color, texture, turgor are normal, no rashes or significant lesions EYES: normal, Conjunctiva are pink and non-injected, sclera clear OROPHARYNX:no exudate, no erythema and lips, buccal mucosa, and tongue normal  NECK: supple, thyroid normal size, non-tender, without nodularity LYMPH:  no palpable lymphadenopathy in the cervical, axillary or inguinal LUNGS: clear to auscultation and percussion with normal breathing effort HEART: regular rate & rhythm and no murmurs and no lower extremity edema ABDOMEN:abdomen soft, non-tender and normal bowel sounds MUSCULOSKELETAL:no cyanosis of digits and no clubbing  NEURO: alert & oriented x 3 with fluent speech, no focal motor/sensory deficits EXTREMITIES: No lower extremity edema   LABORATORY DATA:  I have reviewed the data as listed   Chemistry      Component Value Date/Time   NA 144 10/26/2015 1007   K 4.4 10/26/2015 1007   CL 106 08/12/2013 0923   CL 107 04/05/2012 1253   CO2 27 10/26/2015 1007   BUN 20.5 10/26/2015 1007   CREATININE 0.7 10/26/2015 1007      Component Value Date/Time   CALCIUM 10.1 10/26/2015 1007   ALKPHOS 119 10/26/2015 1007   AST 39 (H) 10/26/2015 1007   ALT 61 (H) 10/26/2015 1007   BILITOT 0.74 10/26/2015 1007       Lab Results  Component Value Date   WBC 6.3 12/25/2015   HGB 14.3 12/25/2015   HCT 43.0 12/25/2015   MCV 98.0 12/25/2015   PLT 155 12/25/2015   NEUTROABS 4.6 12/25/2015    ASSESSMENT & PLAN:  Follicular lymphoma grade I of  intra-abdominal lymph nodes Retroperitoneal mass biopsy left side 74/16/3845: Follicular lymphoma low-grade grade 1-2, flow cytometry positive for CD10, 20, 3, 10, 5, BCL-2, BCl-6, CD21, Ki-67 less than 10%, stage IB (8X 4 cm mass)  Treatment plan:systemic chemotherapy with bendamustine and Rituxan 6 cycles ( From 08/13/2014 to 01/01/2015) followed by Rituxan maintenance every 2 months for 2 years  PET/CT scan 01/16/2015: Interval decrease in size and metabolic activity of the large left retroperitoneal mass , no increase in activity, size 10 mm decreased from 48 mm SUV 1.9 , no additional hypermetabolic activity I discussed with her that in the future scans will be done on an as-needed basis. There is no role of routine CT scans for surveillance for follicular lymphoma. ------------------------------------------------------------------------------------------------------------------------------------------ Plan: 1. Continue maintenance therapy with Rituxan every 2 months for 2 years Started 02/26/2015. 2. Return to clinic with Rituxan maintenance #[id number]  Dec 2017  Breast cancer of upper-outer quadrant of right female breast [facility]) Right breast cancer: Right mastectomy  04/14/2011: 2 foci of invasive ductal carcinoma 0.2 cm, grade 1 with background extensive DCIS, 0/5 lymph nodes negative,focus 1: ER 94%, PR 100%, Ki-67 5%, HER-2 negative: Focus to ER 100% PR 6% Ki-67 29% HER-2 negative Currently on Femara 2.5 mg daily Tolerating Femara extremely well.  Breast Cancer Surveillance: 1. Breast exam 12/25/2015: Right mastectomy 2. Mammogram 06/29/2015 No abnormalities. Postsurgical changes. Breast Density Category B. I recommended that she get 3-D mammograms for surveillance. Discussed the differences between different breast density categories.   No orders of the defined types were placed in this encounter.  The patient has a good understanding of the overall plan. she agrees with it.  she will call with any problems that may develop before the next visit here.   Rulon Eisenmenger, MD 12/25/15

## 2015-12-26 IMAGING — PT NM PET TUM IMG RESTAG (PS) SKULL BASE T - THIGH
1 of 8 series · 1 of 25 positions shown · non-contrast
Comparison: PET-CT 08/08/2014

CLINICAL DATA: Initial treatment strategy for lymphoma. Patient
received rituximab therapy after completing chemotherapy. Additional
history of breast cancer of the RIGHT breast

EXAM:
NUCLEAR MEDICINE PET SKULL BASE TO THIGH
TECHNIQUE: 10.0 mCi F-18 FDG was injected intravenously. Full-ring PET imaging
was performed from the skull base to thigh after the radiotracer. CT
data was obtained and used for attenuation correction and anatomic
localization.
FASTING BLOOD GLUCOSE:  Value: 89 mg/dl

[Series 4: ct sk_thigh 5.0 hd_fov · axial · 5.0mm · 1.13mm/px · 1 of 206 slices shown]
[im 206/206  brain]
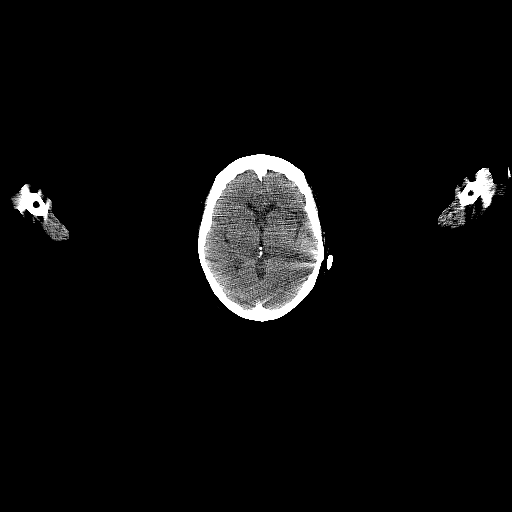

[1 of 25 positions shown; findings below may reference images not displayed]

FINDINGS: NECK

No hypermetabolic lymph nodes in the neck.

CHEST

No hypermetabolic mediastinal or hilar nodes. No suspicious
pulmonary nodules on the CT scan. RIGHT breast implant noted. Port
RIGHT chest.

Lobular enlargement LEFT lobe of thyroid gland without significant
metabolic activity.

ABDOMEN/PELVIS

Interval reduction in size and metabolic activity of large
retroperitoneal mass LEFT of the aorta at the level the renal vein.
Mass measures 10 mm short axis (image 121, series 4) reduced from 48
mm short axis on comparison PET-CT (08/08/2014). Lesions also
reduced in size from most recent CT of 10/26/2014 at which time
lesion measures 16 mm in thickness. Lesion now has metabolic
activity equal to background blood pool activity / mediastinal
activity with SUV max equal 1.9.

No new hypermetabolic nodes in the abdomen pelvis. No abnormal
metabolic activity in the liver or spleen.

Atherosclerotic calcification of the aorta.

Single focus of metabolic activity associated with the descending
colon is felt to represent inflamed diverticulum

SKELETON

No focal hypermetabolic activity to suggest skeletal metastasis.
IMPRESSION: 1. Interval decrease in size and metabolic activity of LEFT
retroperitoneal mass. Mass now has metabolic activity less than or
equal to blood pool / mediastinal activity. Lesion is much reduced
in volume additionally.
2. No hypermetabolic lymph nodes in the chest, abdomen or pelvis.
3. Colonic diverticulosis without evidence acute diverticulitis.
4. No evidence of breast cancer recurrence or metastasis.

## 2016-02-24 NOTE — Assessment & Plan Note (Signed)
Right breast cancer: Right mastectomy 04/14/2011: 2 foci of invasive ductal carcinoma 0.2 cm, grade 1 with background extensive DCIS, 0/5 lymph nodes negative,focus 1: ER 94%, PR 100%, Ki-67 5%, HER-2 negative: Focus to ER 100% PR 6% Ki-67 29% HER-2 negative Currently on Femara 2.5 mg daily Tolerating Femara extremely well.  Breast Cancer Surveillance: 1. Breast exam 02/24/16: Right mastectomy 2. Mammogram 06/05/2017No abnormalities. Postsurgical changes. Breast Density Category B. I recommended that she get 3-D mammograms for surveillance. Discussed the differences between different breast density categories.

## 2016-02-25 ENCOUNTER — Other Ambulatory Visit (HOSPITAL_BASED_OUTPATIENT_CLINIC_OR_DEPARTMENT_OTHER): Payer: Medicare Other

## 2016-02-25 ENCOUNTER — Ambulatory Visit (HOSPITAL_BASED_OUTPATIENT_CLINIC_OR_DEPARTMENT_OTHER): Payer: Medicare Other | Admitting: Hematology and Oncology

## 2016-02-25 ENCOUNTER — Encounter: Payer: Self-pay | Admitting: Hematology and Oncology

## 2016-02-25 ENCOUNTER — Ambulatory Visit (HOSPITAL_BASED_OUTPATIENT_CLINIC_OR_DEPARTMENT_OTHER): Payer: Medicare Other

## 2016-02-25 VITALS — BP 153/82 | HR 65 | Temp 98.5°F | Resp 18

## 2016-02-25 DIAGNOSIS — C8203 Follicular lymphoma grade I, intra-abdominal lymph nodes: Secondary | ICD-10-CM | POA: Diagnosis not present

## 2016-02-25 DIAGNOSIS — Z5112 Encounter for antineoplastic immunotherapy: Secondary | ICD-10-CM | POA: Diagnosis not present

## 2016-02-25 DIAGNOSIS — Z170 Estrogen receptor positive status [ER+]: Secondary | ICD-10-CM

## 2016-02-25 DIAGNOSIS — C50411 Malignant neoplasm of upper-outer quadrant of right female breast: Secondary | ICD-10-CM

## 2016-02-25 LAB — COMPREHENSIVE METABOLIC PANEL
ALBUMIN: 3.9 g/dL (ref 3.5–5.0)
ALT: 50 U/L (ref 0–55)
ANION GAP: 8 meq/L (ref 3–11)
AST: 41 U/L — AB (ref 5–34)
Alkaline Phosphatase: 113 U/L (ref 40–150)
BILIRUBIN TOTAL: 0.67 mg/dL (ref 0.20–1.20)
BUN: 26.8 mg/dL — AB (ref 7.0–26.0)
CALCIUM: 10 mg/dL (ref 8.4–10.4)
CHLORIDE: 107 meq/L (ref 98–109)
CO2: 27 mEq/L (ref 22–29)
CREATININE: 0.7 mg/dL (ref 0.6–1.1)
EGFR: 79 mL/min/{1.73_m2} — ABNORMAL LOW (ref >90)
Glucose: 93 mg/dl (ref 70–140)
Potassium: 4.4 mEq/L (ref 3.5–5.1)
Sodium: 141 mEq/L (ref 136–145)
TOTAL PROTEIN: 6.7 g/dL (ref 6.4–8.3)

## 2016-02-25 LAB — CBC WITH DIFFERENTIAL/PLATELET
BASO%: 1.1 % (ref 0.0–2.0)
Basophils Absolute: 0.1 10*3/uL (ref 0.0–0.1)
EOS%: 7.9 % — AB (ref 0.0–7.0)
Eosinophils Absolute: 0.5 10*3/uL (ref 0.0–0.5)
HEMATOCRIT: 43.1 % (ref 34.8–46.6)
HEMOGLOBIN: 14.6 g/dL (ref 11.6–15.9)
LYMPH#: 0.6 10*3/uL — AB (ref 0.9–3.3)
LYMPH%: 10 % — ABNORMAL LOW (ref 14.0–49.7)
MCH: 33.4 pg (ref 25.1–34.0)
MCHC: 33.9 g/dL (ref 31.5–36.0)
MCV: 98.6 fL (ref 79.5–101.0)
MONO#: 0.9 10*3/uL (ref 0.1–0.9)
MONO%: 14 % (ref 0.0–14.0)
NEUT%: 67 % (ref 38.4–76.8)
NEUTROS ABS: 4.1 10*3/uL (ref 1.5–6.5)
PLATELETS: 156 10*3/uL (ref 145–400)
RBC: 4.37 10*6/uL (ref 3.70–5.45)
RDW: 13 % (ref 11.2–14.5)
WBC: 6.2 10*3/uL (ref 3.9–10.3)

## 2016-02-25 MED ORDER — ACETAMINOPHEN 325 MG PO TABS
650.0000 mg | ORAL_TABLET | Freq: Once | ORAL | Status: AC
  Administered 2016-02-25: 650 mg via ORAL

## 2016-02-25 MED ORDER — DIPHENHYDRAMINE HCL 25 MG PO CAPS
ORAL_CAPSULE | ORAL | Status: AC
  Filled 2016-02-25: qty 2

## 2016-02-25 MED ORDER — SODIUM CHLORIDE 0.9 % IJ SOLN
10.0000 mL | INTRAMUSCULAR | Status: DC | PRN
  Administered 2016-02-25: 10 mL
  Filled 2016-02-25: qty 10

## 2016-02-25 MED ORDER — SODIUM CHLORIDE 0.9 % IV SOLN
375.0000 mg/m2 | Freq: Once | INTRAVENOUS | Status: AC
  Administered 2016-02-25: 800 mg via INTRAVENOUS
  Filled 2016-02-25: qty 50

## 2016-02-25 MED ORDER — LETROZOLE 2.5 MG PO TABS: 2.5000 mg | ORAL_TABLET | Freq: Every day | ORAL | 0 refills | Status: DC

## 2016-02-25 MED ORDER — HEPARIN SOD (PORK) LOCK FLUSH 100 UNIT/ML IV SOLN
500.0000 [IU] | Freq: Once | INTRAVENOUS | Status: AC | PRN
  Administered 2016-02-25: 500 [IU]
  Filled 2016-02-25: qty 5

## 2016-02-25 MED ORDER — DIPHENHYDRAMINE HCL 25 MG PO CAPS
50.0000 mg | ORAL_CAPSULE | Freq: Once | ORAL | Status: AC
  Administered 2016-02-25: 50 mg via ORAL

## 2016-02-25 MED ORDER — SODIUM CHLORIDE 0.9 % IV SOLN
Freq: Once | INTRAVENOUS | Status: AC
  Administered 2016-02-25: 12:00:00 via INTRAVENOUS

## 2016-02-25 MED ORDER — ACETAMINOPHEN 325 MG PO TABS
ORAL_TABLET | ORAL | Status: AC
  Filled 2016-02-25: qty 2

## 2016-02-25 NOTE — Progress Notes (Signed)
Patient Care Team: Marletta Lor, MD as PCP - General  DIAGNOSIS:  Encounter Diagnoses  Name Primary?  . Malignant neoplasm of upper-outer quadrant of right breast in female, estrogen receptor positive ([place])   . Follicular 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 ([place])     SUMMARY OF ONCOLOGIC HISTORY:   Breast cancer of upper-outer quadrant of right female breast ([place])   04/14/2011 Surgery    Right mastectomy: 2 foci of invasive ductal carcinoma 0.2 cm, grade 1 with background extensive DCIS, 0/5 lymph nodes negative,focus 1: ER 94%, PR 100%, Ki-67 5%, HER-2 negative: Focus to ER 100%BR 6% Ki-67 29% HER-2 negative      05/02/2011 -  Anti-estrogen oral therapy    letrozole 2.5 mg daily       Follicular 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 ([name])   07/24/2014 Initial Diagnosis    Retroperitoneal mass biopsy left side: Follicular lymphoma low-grade grade 1-2, flow cytometry positive for CD10, 20, 3, 10, 5, BCL-2, BCl-6, CD21, Ki-67 less than 10%      08/13/2014 -  Chemotherapy    Bendamustine and Rituxan day 1, 2 every 4 weeks 6 cycles followed by Rituxan maintenance every 2 months 2 years      11/03/2014 Imaging    midpoint of chemotherapy: Moderate improvement in the periaortic lymphadenopathy without complete resolution      01/16/2015 PET scan     interval decrease in size and metabolic activity of the large left retroperitoneal mass , no increase in activity, size 10 mm decreased from 48 mm  SUV 1.9 , no additional hypermetabolic activity       CHIEF COMPLIANT: Follicular lymphoma, Rituxan maintenance  INTERVAL HISTORY: Selena [name] is a 81 year old with above-mentioned history of breast cancer and follicular lymphoma. She is currently on Rituxan maintenance. She has had no problems tolerating Rituxan. She denies any reaction to treatment. She denies any nausea vomiting. She denies any fevers chills night sweats or weight loss. She is also  taking letrozole for breast cancer. She appears to be tolerating anastrozole extremely well. She will complete 5 years of letrozole by April 2018. Because her primary tumor was so small we decided that 5 years was adequate. She had recent upper respiratory infection and bronchitis and she appears to be getting better.  REVIEW OF SYSTEMS:   Constitutional: Denies fevers, chills or abnormal weight loss Eyes: Denies blurriness of vision Ears, nose, mouth, throat, and face: Denies mucositis or sore throat Respiratory: Denies cough, dyspnea or wheezes Cardiovascular: Denies palpitation, chest discomfort Gastrointestinal:  Denies nausea, heartburn or change in bowel habits Skin: Denies abnormal skin rashes Lymphatics: Denies new lymphadenopathy or easy bruising Neurological:Denies numbness, tingling or new weaknesses Behavioral/Psych: Mood is stable, no new changes  Extremities: No lower extremity edema Breast:  denies any pain or lumps or nodules in either breasts All other systems were reviewed with the patient and are negative.  I have reviewed the past medical history, past surgical history, social history and family history with the patient and they are unchanged from previous note.  ALLERGIES:  is allergic to codeine and codeine phosphate.  MEDICATIONS:  Current Outpatient Prescriptions  Medication Sig Dispense Refill  . cholecalciferol (VITAMIN D) 1000 UNITS tablet Take 1,000 Units by mouth daily.    . diclofenac (VOLTAREN) 75 MG EC tablet TAKE 1 TABLET BY MOUTH TWICE DAILY 180 tablet 3  . HYDROcodone-acetaminophen (NORCO) 10-325 MG tablet Take 1 tablet by mouth every 6 (six)  hours as needed. 60 tablet 0  . letrozole (FEMARA) 2.5 MG tablet Take 1 tablet (2.5 mg total) by mouth daily. 90 tablet 0  . lidocaine-prilocaine (EMLA) cream APP EXT AA ONCE UTD  3  . meclizine (ANTIVERT) 25 MG tablet Take 1 tablet (25 mg total) by mouth every 6 (six) hours as needed for dizziness. 60 tablet 0  .  trimethoprim-polymyxin b (POLYTRIM) ophthalmic solution Place 2 drops into the left eye every 4 (four) hours. 10 mL 0  . UNABLE TO FIND Rx: L8000- Post Surgical Bras (Quantity: 6) [id number]- Silicone Breast Prosthesis (Quantity: 1) Dx: 174.9; Right Mastectomy 1 each 0   No current facility-administered medications for this visit.     PHYSICAL EXAMINATION: ECOG PERFORMANCE STATUS: 1 - Symptomatic but completely ambulatory  Vitals:   02/25/16 1003  BP: (!) 161/66  Pulse: 73  Resp: 18  Temp: 98.2 F (36.8 C)   Filed Weights   02/25/16 1003  Weight: 201 lb 3.2 oz (91.3 kg)    GENERAL:alert, no distress and comfortable SKIN: skin color, texture, turgor are normal, no rashes or significant lesions EYES: normal, Conjunctiva are pink and non-injected, sclera clear OROPHARYNX:no exudate, no erythema and lips, buccal mucosa, and tongue normal  NECK: supple, thyroid normal size, non-tender, without nodularity LYMPH:  no palpable lymphadenopathy in the cervical, axillary or inguinal LUNGS: clear to auscultation and percussion with normal breathing effort HEART: regular rate & rhythm and no murmurs and no lower extremity edema ABDOMEN:abdomen soft, non-tender and normal bowel sounds MUSCULOSKELETAL:no cyanosis of digits and no clubbing  NEURO: alert & oriented x 3 with fluent speech, no focal motor/sensory deficits EXTREMITIES: No lower extremity edema  LABORATORY DATA:  I have reviewed the data as listed   Chemistry      Component Value Date/Time   NA 141 02/25/2016 0948   K 4.4 02/25/2016 0948   CL 106 08/12/2013 0923   CL 107 04/05/2012 1253   CO2 27 02/25/2016 0948   BUN 26.8 (H) 02/25/2016 0948   CREATININE 0.7 02/25/2016 0948      Component Value Date/Time   CALCIUM 10.0 02/25/2016 0948   ALKPHOS 113 02/25/2016 0948   AST 41 (H) 02/25/2016 0948   ALT 50 02/25/2016 0948   BILITOT 0.67 02/25/2016 0948       Lab Results  Component Value Date   WBC 6.2 02/25/2016   HGB  14.6 02/25/2016   HCT 43.1 02/25/2016   MCV 98.6 02/25/2016   PLT 156 02/25/2016   NEUTROABS 4.1 02/25/2016    ASSESSMENT & PLAN:  Breast cancer of upper-outer quadrant of right female breast ([place]) Right breast cancer: Right mastectomy 04/14/2011: 2 foci of invasive ductal carcinoma 0.2 cm, grade 1 with background extensive DCIS, 0/5 lymph nodes negative,focus 1: ER 94%, PR 100%, Ki-67 5%, HER-2 negative: Focus to ER 100% PR 6% Ki-67 29% HER-2 negative Currently on Femara 2.5 mg daily Tolerating Femara extremely well. She will finish 5 years of letrozole by April 2018. She will then stop it. I sent a prescription for letrozole. I discussed with her that because her primary tumor was very small and low risk, I did not think she would need to stay on extended adjuvant therapy.  Breast Cancer Surveillance: 1. Breast exam 02/24/16: Right mastectomy 2. Mammogram 06/05/2017No abnormalities. Postsurgical changes. Breast Density Category B. I recommended that she get 3-D mammograms for surveillance. Discussed the differences between different breast density categories. ------------------------------------------------------------------------------------------------------------------------------------------------------------------------------- Follicular 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  Retroperitoneal mass biopsy left side 65/68/1275: Follicular lymphoma low-grade grade 1-2, flow cytometry positive for CD10, 20, 3, 10, 5, BCL-2, BCl-6, CD21, Ki-67 less than 10%, stage IB (8X 4 cm mass)  Treatment plan:systemic chemotherapy with bendamustine and Rituxan 6 cycles ( From 08/13/2014 to 01/01/2015) followed by Rituxan maintenance every 2 months for 2 years  PET/CT scan 01/16/2015: Interval decrease in size and metabolic activity of the large left retroperitoneal mass , no increase in activity, size 10 mm decreased from 48 mm SUV 1.9 , no additional hypermetabolic activity I discussed  with her that in the future scans will be done on an as-needed basis. There is no role of routine CT scans for surveillance for follicular lymphoma. ------------------------------------------------------------------------------------------------------------------------------------------ Plan: 1. Continue maintenance therapy with Rituxan every 2 months for 2 years Started02/02/2015. 2. Return to clinic with Rituxan maintenance  in 2 months   I spent 25 minutes talking to the patient of which more than half was spent in counseling and coordination of care.  No orders of the defined types were placed in this encounter.  The patient has a good understanding of the overall plan. she agrees with it. she will call with any problems that may develop before the next visit here.   Rulon Eisenmenger, MD 02/25/16

## 2016-02-25 NOTE — Assessment & Plan Note (Signed)
Retroperitoneal mass biopsy left side 07/24/2014: Follicular lymphoma low-grade grade 1-2, flow cytometry positive for CD10, 20, 3, 10, 5, BCL-2, BCl-6, CD21, Ki-67 less than 10%, stage IB (8X 4 cm mass)  Treatment plan:systemic chemotherapy with bendamustine and Rituxan 6 cycles ( From 08/13/2014 to 01/01/2015) followed by Rituxan maintenance every 2 months for 2 years  PET/CT scan 01/16/2015: Interval decrease in size and metabolic activity of the large left retroperitoneal mass , no increase in activity, size 10 mm decreased from 48 mm SUV 1.9 , no additional hypermetabolic activity I discussed with her that in the future scans will be done on an as-needed basis. There is no role of routine CT scans for surveillance for follicular lymphoma. ------------------------------------------------------------------------------------------------------------------------------------------ Plan: 1. Continue maintenance therapy with Rituxan every 2 months for 2 years Started02/02/2015. 2. Return to clinic with Rituxan maintenance  in 2 months  

## 2016-02-25 NOTE — Patient Instructions (Signed)
[facility] Discharge Instructions for Patients Receiving Chemotherapy  Today you received the following chemotherapy agents Rituxan To help prevent nausea and vomiting after your treatment, we encourage you to take your nausea medication as prescribed.  If you develop nausea and vomiting that is not controlled by your nausea medication, call the clinic.   BELOW ARE SYMPTOMS THAT SHOULD BE REPORTED IMMEDIATELY:  *FEVER GREATER THAN 100.5 F  *CHILLS WITH OR WITHOUT FEVER  NAUSEA AND VOMITING THAT IS NOT CONTROLLED WITH YOUR NAUSEA MEDICATION  *UNUSUAL SHORTNESS OF BREATH  *UNUSUAL BRUISING OR BLEEDING  TENDERNESS IN MOUTH AND THROAT WITH OR WITHOUT PRESENCE OF ULCERS  *URINARY PROBLEMS  *BOWEL PROBLEMS  UNUSUAL RASH Items with * indicate a potential emergency and should be followed up as soon as possible.  Feel free to call the clinic you have any questions or concerns. The clinic phone number is [phone].  Please show the CHEMO ALERT CARD at check-in to the Emergency Department and triage nurse.   

## 2016-03-18 DIAGNOSIS — M25561 Pain in right knee: Secondary | ICD-10-CM | POA: Diagnosis not present

## 2016-03-18 DIAGNOSIS — M25562 Pain in left knee: Secondary | ICD-10-CM | POA: Diagnosis not present

## 2016-03-18 DIAGNOSIS — M25571 Pain in right ankle and joints of right foot: Secondary | ICD-10-CM | POA: Diagnosis not present

## 2016-03-24 ENCOUNTER — Other Ambulatory Visit: Payer: Medicare Other

## 2016-03-24 ENCOUNTER — Ambulatory Visit: Payer: Medicare Other | Admitting: Hematology and Oncology

## 2016-04-19 ENCOUNTER — Ambulatory Visit (HOSPITAL_BASED_OUTPATIENT_CLINIC_OR_DEPARTMENT_OTHER): Payer: Medicare Other

## 2016-04-19 ENCOUNTER — Encounter: Payer: Self-pay | Admitting: Hematology and Oncology

## 2016-04-19 ENCOUNTER — Other Ambulatory Visit (HOSPITAL_BASED_OUTPATIENT_CLINIC_OR_DEPARTMENT_OTHER): Payer: Medicare Other

## 2016-04-19 ENCOUNTER — Ambulatory Visit (HOSPITAL_BASED_OUTPATIENT_CLINIC_OR_DEPARTMENT_OTHER): Payer: Medicare Other | Admitting: Hematology and Oncology

## 2016-04-19 VITALS — BP 132/61 | HR 63 | Temp 97.8°F | Resp 18

## 2016-04-19 DIAGNOSIS — C8203 Follicular lymphoma grade I, intra-abdominal lymph nodes: Secondary | ICD-10-CM | POA: Diagnosis not present

## 2016-04-19 DIAGNOSIS — Z5112 Encounter for antineoplastic immunotherapy: Secondary | ICD-10-CM | POA: Diagnosis not present

## 2016-04-19 DIAGNOSIS — C50411 Malignant neoplasm of upper-outer quadrant of right female breast: Secondary | ICD-10-CM

## 2016-04-19 DIAGNOSIS — Z170 Estrogen receptor positive status [ER+]: Secondary | ICD-10-CM

## 2016-04-19 LAB — COMPREHENSIVE METABOLIC PANEL
ALT: 45 U/L (ref 0–55)
ANION GAP: 8 meq/L (ref 3–11)
AST: 32 U/L (ref 5–34)
Albumin: 3.9 g/dL (ref 3.5–5.0)
Alkaline Phosphatase: 111 U/L (ref 40–150)
BILIRUBIN TOTAL: 0.6 mg/dL (ref 0.20–1.20)
BUN: 18.7 mg/dL (ref 7.0–26.0)
CALCIUM: 10.1 mg/dL (ref 8.4–10.4)
CO2: 27 mEq/L (ref 22–29)
CREATININE: 0.7 mg/dL (ref 0.6–1.1)
Chloride: 107 mEq/L (ref 98–109)
EGFR: 79 mL/min/{1.73_m2} — ABNORMAL LOW (ref >90)
Glucose: 79 mg/dl (ref 70–140)
Potassium: 4.5 mEq/L (ref 3.5–5.1)
Sodium: 143 mEq/L (ref 136–145)
TOTAL PROTEIN: 6.8 g/dL (ref 6.4–8.3)

## 2016-04-19 LAB — CBC WITH DIFFERENTIAL/PLATELET
BASO%: 1.1 % (ref 0.0–2.0)
Basophils Absolute: 0.1 10*3/uL (ref 0.0–0.1)
EOS%: 4 % (ref 0.0–7.0)
Eosinophils Absolute: 0.3 10*3/uL (ref 0.0–0.5)
HEMATOCRIT: 43.4 % (ref 34.8–46.6)
HEMOGLOBIN: 14.7 g/dL (ref 11.6–15.9)
LYMPH#: 0.6 10*3/uL — AB (ref 0.9–3.3)
LYMPH%: 9.8 % — ABNORMAL LOW (ref 14.0–49.7)
MCH: 33.4 pg (ref 25.1–34.0)
MCHC: 33.8 g/dL (ref 31.5–36.0)
MCV: 99 fL (ref 79.5–101.0)
MONO#: 0.8 10*3/uL (ref 0.1–0.9)
MONO%: 12 % (ref 0.0–14.0)
NEUT#: 4.8 10*3/uL (ref 1.5–6.5)
NEUT%: 73.1 % (ref 38.4–76.8)
PLATELETS: 160 10*3/uL (ref 145–400)
RBC: 4.39 10*6/uL (ref 3.70–5.45)
RDW: 13.1 % (ref 11.2–14.5)
WBC: 6.5 10*3/uL (ref 3.9–10.3)

## 2016-04-19 MED ORDER — ACETAMINOPHEN 325 MG PO TABS
650.0000 mg | ORAL_TABLET | Freq: Once | ORAL | Status: AC
  Administered 2016-04-19: 650 mg via ORAL

## 2016-04-19 MED ORDER — DIPHENHYDRAMINE HCL 25 MG PO CAPS
50.0000 mg | ORAL_CAPSULE | Freq: Once | ORAL | Status: AC
  Administered 2016-04-19: 50 mg via ORAL

## 2016-04-19 MED ORDER — ACETAMINOPHEN 325 MG PO TABS
ORAL_TABLET | ORAL | Status: AC
  Filled 2016-04-19: qty 2

## 2016-04-19 MED ORDER — SODIUM CHLORIDE 0.9 % IV SOLN
375.0000 mg/m2 | Freq: Once | INTRAVENOUS | Status: AC
  Administered 2016-04-19: 800 mg via INTRAVENOUS
  Filled 2016-04-19: qty 50

## 2016-04-19 MED ORDER — DIPHENHYDRAMINE HCL 25 MG PO CAPS
ORAL_CAPSULE | ORAL | Status: AC
  Filled 2016-04-19: qty 2

## 2016-04-19 MED ORDER — SODIUM CHLORIDE 0.9 % IV SOLN
Freq: Once | INTRAVENOUS | Status: AC
  Administered 2016-04-19: 12:00:00 via INTRAVENOUS

## 2016-04-19 MED ORDER — SODIUM CHLORIDE 0.9 % IJ SOLN
10.0000 mL | INTRAMUSCULAR | Status: DC | PRN
  Administered 2016-04-19: 10 mL
  Filled 2016-04-19: qty 10

## 2016-04-19 MED ORDER — HEPARIN SOD (PORK) LOCK FLUSH 100 UNIT/ML IV SOLN
500.0000 [IU] | Freq: Once | INTRAVENOUS | Status: AC | PRN
  Administered 2016-04-19: 500 [IU]
  Filled 2016-04-19: qty 5

## 2016-04-19 NOTE — Assessment & Plan Note (Signed)
Retroperitoneal mass biopsy left side 02/23/4386: Follicular lymphoma low-grade grade 1-2, flow cytometry positive for CD10, 20, 3, 10, 5, BCL-2, BCl-6, CD21, Ki-67 less than 10%, stage IB (8X 4 cm mass)  Treatment plan:systemic chemotherapy with bendamustine and Rituxan 6 cycles ( From 08/13/2014 to 01/01/2015) followed by Rituxan maintenance every 2 months for 2 years  PET/CT scan 01/16/2015: Interval decrease in size and metabolic activity of the large left retroperitoneal mass , no increase in activity, size 10 mm decreased from 48 mm SUV 1.9 , no additional hypermetabolic activity I discussed with her that in the future scans will be done on an as-needed basis. There is no role of routine CT scans for surveillance for follicular lymphoma. ------------------------------------------------------------------------------------------------------------------------------------------ Plan: 1. Continue maintenance therapy with Rituxan every 2 months for 2 years Started02/02/2015. 2. Return to clinic with Rituxan maintenance  in 2 months

## 2016-04-19 NOTE — Assessment & Plan Note (Signed)
Right breast cancer: Right mastectomy 04/14/2011: 2 foci of invasive ductal carcinoma 0.2 cm, grade 1 with background extensive DCIS, 0/5 lymph nodes negative,focus 1: ER 94%, PR 100%, Ki-67 5%, HER-2 negative: Focus to ER 100% PR 6% Ki-67 29% HER-2 negative Currently on Femara 2.5 mg daily Tolerating Femara extremely well. She will finish 5 years of letrozole by April 2018. She will then stop it. I sent a prescription for letrozole. I discussed with her that because her primary tumor was very small and low risk, I did not think she would need to stay on extended adjuvant therapy.  Breast Cancer Surveillance: 1. Breast exam 02/24/16: Right mastectomy 2. Mammogram 06/05/2017No abnormalities. Postsurgical changes. Breast Density Category B. I recommended that she get 3-D mammograms for surveillance. Discussed the differences between different breast density categories.

## 2016-04-19 NOTE — Progress Notes (Signed)
Patient Care Team: Marletta Lor, MD as PCP - General  DIAGNOSIS:  Encounter Diagnoses  Name Primary?  . Malignant neoplasm of upper-outer quadrant of right breast in female, estrogen receptor positive ([name])   . Follicular 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 ([place])     SUMMARY OF ONCOLOGIC HISTORY:   Breast cancer of upper-outer quadrant of right female breast ([place])   04/14/2011 Surgery    Right mastectomy: 2 foci of invasive ductal carcinoma 0.2 cm, grade 1 with background extensive DCIS, 0/5 lymph nodes negative,focus 1: ER 94%, PR 100%, Ki-67 5%, HER-2 negative: Focus to ER 100%BR 6% Ki-67 29% HER-2 negative      05/02/2011 -  Anti-estrogen oral therapy    letrozole 2.5 mg daily       Follicular 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 ([name])   07/24/2014 Initial Diagnosis    Retroperitoneal mass biopsy left side: Follicular lymphoma low-grade grade 1-2, flow cytometry positive for CD10, 20, 3, 10, 5, BCL-2, BCl-6, CD21, Ki-67 less than 10%      08/13/2014 -  Chemotherapy    Bendamustine and Rituxan day 1, 2 every 4 weeks 6 cycles followed by Rituxan maintenance every 2 months 2 years      11/03/2014 Imaging    midpoint of chemotherapy: Moderate improvement in the periaortic lymphadenopathy without complete resolution      01/16/2015 PET scan     interval decrease in size and metabolic activity of the large left retroperitoneal mass , no increase in activity, size 10 mm decreased from 48 mm  SUV 1.9 , no additional hypermetabolic activity       CHIEF COMPLIANT: Follow-up of Rituxan maintenance  INTERVAL HISTORY: Selena [name] is a 81 year old with above-mentioned history of follicular lymphoma currently on maintenance therapy with Rituxan. She is tolerating it extremely well without any major problems or concerns. She denies any lumps or nodules in the chest wall or axilla.  REVIEW OF SYSTEMS:   Constitutional: Denies fevers, chills or  abnormal weight loss Eyes: Denies blurriness of vision Ears, nose, mouth, throat, and face: Denies mucositis or sore throat Respiratory: Denies cough, dyspnea or wheezes Cardiovascular: Denies palpitation, chest discomfort Gastrointestinal:  Denies nausea, heartburn or change in bowel habits Skin: Denies abnormal skin rashes Lymphatics: Denies new lymphadenopathy or easy bruising Neurological:Denies numbness, tingling or new weaknesses Behavioral/Psych: Mood is stable, no new changes  Extremities: No lower extremity edema Breast:  denies any pain or lumps or nodules in either breasts All other systems were reviewed with the patient and are negative.  I have reviewed the past medical history, past surgical history, social history and family history with the patient and they are unchanged from previous note.  ALLERGIES:  is allergic to codeine and codeine phosphate.  MEDICATIONS:  Current Outpatient Prescriptions  Medication Sig Dispense Refill  . cholecalciferol (VITAMIN D) 1000 UNITS tablet Take 1,000 Units by mouth daily.    . diclofenac (VOLTAREN) 75 MG EC tablet TAKE 1 TABLET BY MOUTH TWICE DAILY 180 tablet 3  . HYDROcodone-acetaminophen (NORCO) 10-325 MG tablet Take 1 tablet by mouth every 6 (six) hours as needed. 60 tablet 0  . letrozole (FEMARA) 2.5 MG tablet Take 1 tablet (2.5 mg total) by mouth daily. 90 tablet 0  . lidocaine-prilocaine (EMLA) cream APP EXT AA ONCE UTD  3  . meclizine (ANTIVERT) 25 MG tablet Take 1 tablet (25 mg total) by mouth every 6 (six) hours as needed for dizziness. 60 tablet  0  . trimethoprim-polymyxin b (POLYTRIM) ophthalmic solution Place 2 drops into the left eye every 4 (four) hours. 10 mL 0  . UNABLE TO FIND Rx: L8000- Post Surgical Bras (Quantity: 6) [id number]- Silicone Breast Prosthesis (Quantity: 1) Dx: 174.9; Right Mastectomy 1 each 0   No current facility-administered medications for this visit.    Facility-Administered Medications Ordered in  Other Visits  Medication Dose Route Frequency Provider Last Rate Last Dose  . sodium chloride 0.9 % injection 10 mL  10 mL Intracatheter PRN Nicholas Lose, MD   10 mL at 04/19/16 1543    PHYSICAL EXAMINATION: ECOG PERFORMANCE STATUS: 0 - Asymptomatic  Vitals:   04/19/16 0959  BP: (!) 176/75  Pulse: 67  Resp: 18  Temp: 97.3 F (36.3 C)   Filed Weights   04/19/16 0959  Weight: 202 lb 12.8 oz (92 kg)    GENERAL:alert, no distress and comfortable SKIN: skin color, texture, turgor are normal, no rashes or significant lesions EYES: normal, Conjunctiva are pink and non-injected, sclera clear OROPHARYNX:no exudate, no erythema and lips, buccal mucosa, and tongue normal  NECK: supple, thyroid normal size, non-tender, without nodularity LYMPH:  no palpable lymphadenopathy in the cervical, axillary or inguinal LUNGS: clear to auscultation and percussion with normal breathing effort HEART: regular rate & rhythm and no murmurs and no lower extremity edema ABDOMEN:abdomen soft, non-tender and normal bowel sounds MUSCULOSKELETAL:no cyanosis of digits and no clubbing  NEURO: alert & oriented x 3 with fluent speech, no focal motor/sensory deficits EXTREMITIES: No lower extremity edema  LABORATORY DATA:  I have reviewed the data as listed   Chemistry      Component Value Date/Time   NA 143 04/19/2016 0945   K 4.5 04/19/2016 0945   CL 106 08/12/2013 0923   CL 107 04/05/2012 1253   CO2 27 04/19/2016 0945   BUN 18.7 04/19/2016 0945   CREATININE 0.7 04/19/2016 0945      Component Value Date/Time   CALCIUM 10.1 04/19/2016 0945   ALKPHOS 111 04/19/2016 0945   AST 32 04/19/2016 0945   ALT 45 04/19/2016 0945   BILITOT 0.60 04/19/2016 0945       Lab Results  Component Value Date   WBC 6.5 04/19/2016   HGB 14.7 04/19/2016   HCT 43.4 04/19/2016   MCV 99.0 04/19/2016   PLT 160 04/19/2016   NEUTROABS 4.8 04/19/2016    ASSESSMENT & PLAN:  Breast cancer of upper-outer quadrant of  right female breast ([name]) Right breast cancer: Right mastectomy 04/14/2011: 2 foci of invasive ductal carcinoma 0.2 cm, grade 1 with background extensive DCIS, 0/5 lymph nodes negative,focus 1: ER 94%, PR 100%, Ki-67 5%, HER-2 negative: Focus to ER 100% PR 6% Ki-67 29% HER-2 negative Currently on Femara 2.5 mg daily Tolerating Femara extremely well. She will finish 5 years of letrozole by April 2018. She will then stop it. I sent a prescription for letrozole. I discussed with her that because her primary tumor was very small and low risk, I did not think she would need to stay on extended adjuvant therapy.  Breast Cancer Surveillance: 1. Breast exam 02/24/16: Right mastectomy 2. Mammogram 06/05/2017No abnormalities. Postsurgical changes. Breast Density Category B. I recommended that she get 3-D mammograms for surveillance. Discussed the differences between different breast density categories.   Follicular 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 Retroperitoneal mass biopsy left side 81/82/9937: Follicular lymphoma low-grade grade 1-2, flow cytometry positive for CD10, 20, 3, 10, 5, BCL-2, BCl-6, CD21, Ki-67  less than 10%, stage IB (8X 4 cm mass)  Treatment plan:systemic chemotherapy with bendamustine and Rituxan 6 cycles ( From 08/13/2014 to 01/01/2015) followed by Rituxan maintenance every 2 months for 2 years  PET/CT scan 01/16/2015: Interval decrease in size and metabolic activity of the large left retroperitoneal mass , no increase in activity, size 10 mm decreased from 48 mm SUV 1.9 , no additional hypermetabolic activity I discussed with her that in the future scans will be done on an as-needed basis. There is no role of routine CT scans for surveillance for follicular lymphoma. ------------------------------------------------------------------------------------------------------------------------------------------ Plan: 1. Continue maintenance therapy with Rituxan every 2  months for 2 years Started02/02/2015. 2. Return to clinic with Rituxan maintenance  in 2 months    I spent 25 minutes talking to the patient of which more than half was spent in counseling and coordination of care.  No orders of the defined types were placed in this encounter.  The patient has a good understanding of the overall plan. she agrees with it. she will call with any problems that may develop before the next visit here.   Rulon Eisenmenger, MD 04/19/16

## 2016-04-19 NOTE — Patient Instructions (Signed)
[facility] Discharge Instructions for Patients Receiving Chemotherapy  Today you received the following chemotherapy agents Rituxan To help prevent nausea and vomiting after your treatment, we encourage you to take your nausea medication as prescribed.  If you develop nausea and vomiting that is not controlled by your nausea medication, call the clinic.   BELOW ARE SYMPTOMS THAT SHOULD BE REPORTED IMMEDIATELY:  *FEVER GREATER THAN 100.5 F  *CHILLS WITH OR WITHOUT FEVER  NAUSEA AND VOMITING THAT IS NOT CONTROLLED WITH YOUR NAUSEA MEDICATION  *UNUSUAL SHORTNESS OF BREATH  *UNUSUAL BRUISING OR BLEEDING  TENDERNESS IN MOUTH AND THROAT WITH OR WITHOUT PRESENCE OF ULCERS  *URINARY PROBLEMS  *BOWEL PROBLEMS  UNUSUAL RASH Items with * indicate a potential emergency and should be followed up as soon as possible.  Feel free to call the clinic you have any questions or concerns. The clinic phone number is [phone].  Please show the CHEMO ALERT CARD at check-in to the Emergency Department and triage nurse.   

## 2016-04-21 ENCOUNTER — Other Ambulatory Visit: Payer: Medicare Other

## 2016-04-21 ENCOUNTER — Ambulatory Visit: Payer: Medicare Other

## 2016-05-20 ENCOUNTER — Other Ambulatory Visit: Payer: Self-pay | Admitting: Internal Medicine

## 2016-05-20 DIAGNOSIS — Z1231 Encounter for screening mammogram for malignant neoplasm of breast: Secondary | ICD-10-CM

## 2016-06-17 ENCOUNTER — Other Ambulatory Visit: Payer: Self-pay

## 2016-06-17 DIAGNOSIS — M25561 Pain in right knee: Secondary | ICD-10-CM | POA: Diagnosis not present

## 2016-06-17 DIAGNOSIS — Z170 Estrogen receptor positive status [ER+]: Principal | ICD-10-CM

## 2016-06-17 DIAGNOSIS — C50411 Malignant neoplasm of upper-outer quadrant of right female breast: Secondary | ICD-10-CM

## 2016-06-20 NOTE — Assessment & Plan Note (Signed)
Right breast cancer: Right mastectomy 04/14/2011: 2 foci of invasive ductal carcinoma 0.2 cm, grade 1 with background extensive DCIS, 0/5 lymph nodes negative,focus 1: ER 94%, PR 100%, Ki-67 5%, HER-2 negative: Focus to ER 100% PR 6% Ki-67 29% HER-2 negative Currently on Femara 2.5 mg daily Tolerating Femara extremely well. She will finish 5 years of letrozole by April 2018. She will then stop it. I sent a prescription for letrozole. I discussed with her that because her primary tumor was very small and low risk, I did not think she would need to stay on extended adjuvant therapy.  Breast Cancer Surveillance: 1. Breast exam 02/24/16: Right mastectomy 2. Mammogram 06/05/2017No abnormalities. Postsurgical changes. Breast Density Category B. I recommended that she get 3-D mammograms for surveillance. Discussed the differences between different breast density categories.   Follicular 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 Retroperitoneal mass biopsy left side 77/41/2878: Follicular lymphoma low-grade grade 1-2, flow cytometry positive for CD10, 20, 3, 10, 5, BCL-2, BCl-6, CD21, Ki-67 less than 10%, stage IB (8X 4 cm mass)  Treatment plan:systemic chemotherapy with bendamustine and Rituxan 6 cycles ( From 08/13/2014 to 01/01/2015) followed by Rituxan maintenance every 2 months for 2 years  PET/CT scan 01/16/2015: Interval decrease in size and metabolic activity of the large left retroperitoneal mass , no increase in activity, size 10 mm decreased from 48 mm SUV 1.9 , no additional hypermetabolic activity I discussed with her that in the future scans will be done on an as-needed basis. There is no role of routine CT scans for surveillance for follicular lymphoma. ------------------------------------------------------------------------------------------------------------------------------------------ Plan: 1. Continue maintenance therapy with Rituxan every 2 months for 2 years  Started02/02/2015. 2. Return to clinic with Rituxan maintenance in 2 months

## 2016-06-21 ENCOUNTER — Ambulatory Visit (HOSPITAL_BASED_OUTPATIENT_CLINIC_OR_DEPARTMENT_OTHER): Payer: Medicare Other

## 2016-06-21 ENCOUNTER — Ambulatory Visit (HOSPITAL_BASED_OUTPATIENT_CLINIC_OR_DEPARTMENT_OTHER): Payer: Medicare Other | Admitting: Hematology and Oncology

## 2016-06-21 ENCOUNTER — Other Ambulatory Visit (HOSPITAL_BASED_OUTPATIENT_CLINIC_OR_DEPARTMENT_OTHER): Payer: Medicare Other

## 2016-06-21 ENCOUNTER — Encounter: Payer: Self-pay | Admitting: Hematology and Oncology

## 2016-06-21 VITALS — BP 161/65 | HR 66 | Temp 98.1°F | Resp 20

## 2016-06-21 DIAGNOSIS — Z5112 Encounter for antineoplastic immunotherapy: Secondary | ICD-10-CM

## 2016-06-21 DIAGNOSIS — C8203 Follicular lymphoma grade I, intra-abdominal lymph nodes: Secondary | ICD-10-CM

## 2016-06-21 DIAGNOSIS — Z170 Estrogen receptor positive status [ER+]: Secondary | ICD-10-CM | POA: Diagnosis not present

## 2016-06-21 DIAGNOSIS — C50411 Malignant neoplasm of upper-outer quadrant of right female breast: Secondary | ICD-10-CM | POA: Diagnosis not present

## 2016-06-21 DIAGNOSIS — M779 Enthesopathy, unspecified: Secondary | ICD-10-CM | POA: Diagnosis not present

## 2016-06-21 LAB — COMPREHENSIVE METABOLIC PANEL
ALT: 52 U/L (ref 0–55)
ANION GAP: 9 meq/L (ref 3–11)
AST: 36 U/L — ABNORMAL HIGH (ref 5–34)
Albumin: 4 g/dL (ref 3.5–5.0)
Alkaline Phosphatase: 100 U/L (ref 40–150)
BUN: 27.7 mg/dL — ABNORMAL HIGH (ref 7.0–26.0)
CALCIUM: 9.7 mg/dL (ref 8.4–10.4)
CHLORIDE: 108 meq/L (ref 98–109)
CO2: 27 meq/L (ref 22–29)
Creatinine: 1 mg/dL (ref 0.6–1.1)
EGFR: 56 mL/min/{1.73_m2} — ABNORMAL LOW (ref >90)
Glucose: 97 mg/dl (ref 70–140)
POTASSIUM: 4.3 meq/L (ref 3.5–5.1)
Sodium: 143 mEq/L (ref 136–145)
Total Bilirubin: 0.68 mg/dL (ref 0.20–1.20)
Total Protein: 6.8 g/dL (ref 6.4–8.3)

## 2016-06-21 LAB — CBC WITH DIFFERENTIAL/PLATELET
BASO%: 0.2 % (ref 0.0–2.0)
Basophils Absolute: 0 10*3/uL (ref 0.0–0.1)
EOS%: 0.2 % (ref 0.0–7.0)
Eosinophils Absolute: 0 10*3/uL (ref 0.0–0.5)
HEMATOCRIT: 43.4 % (ref 34.8–46.6)
HGB: 14.8 g/dL (ref 11.6–15.9)
LYMPH%: 5.2 % — AB (ref 14.0–49.7)
MCH: 33.6 pg (ref 25.1–34.0)
MCHC: 34.1 g/dL (ref 31.5–36.0)
MCV: 98.6 fL (ref 79.5–101.0)
MONO#: 1.1 10*3/uL — AB (ref 0.1–0.9)
MONO%: 8.9 % (ref 0.0–14.0)
NEUT#: 10.4 10*3/uL — ABNORMAL HIGH (ref 1.5–6.5)
NEUT%: 85.5 % — AB (ref 38.4–76.8)
Platelets: 174 10*3/uL (ref 145–400)
RBC: 4.4 10*6/uL (ref 3.70–5.45)
RDW: 13 % (ref 11.2–14.5)
WBC: 12.2 10*3/uL — AB (ref 3.9–10.3)
lymph#: 0.6 10*3/uL — ABNORMAL LOW (ref 0.9–3.3)
nRBC: 0 % (ref 0–0)

## 2016-06-21 MED ORDER — HEPARIN SOD (PORK) LOCK FLUSH 100 UNIT/ML IV SOLN
500.0000 [IU] | Freq: Once | INTRAVENOUS | Status: AC | PRN
  Administered 2016-06-21: 500 [IU]
  Filled 2016-06-21: qty 5

## 2016-06-21 MED ORDER — SODIUM CHLORIDE 0.9 % IJ SOLN
10.0000 mL | INTRAMUSCULAR | Status: DC | PRN
  Administered 2016-06-21: 10 mL
  Filled 2016-06-21: qty 10

## 2016-06-21 MED ORDER — DIPHENHYDRAMINE HCL 25 MG PO CAPS
50.0000 mg | ORAL_CAPSULE | Freq: Once | ORAL | Status: AC
  Administered 2016-06-21: 50 mg via ORAL

## 2016-06-21 MED ORDER — DIPHENHYDRAMINE HCL 25 MG PO CAPS
ORAL_CAPSULE | ORAL | Status: AC
  Filled 2016-06-21: qty 2

## 2016-06-21 MED ORDER — ACETAMINOPHEN 325 MG PO TABS
650.0000 mg | ORAL_TABLET | Freq: Once | ORAL | Status: AC
  Administered 2016-06-21: 650 mg via ORAL

## 2016-06-21 MED ORDER — ACETAMINOPHEN 325 MG PO TABS
ORAL_TABLET | ORAL | Status: AC
  Filled 2016-06-21: qty 2

## 2016-06-21 MED ORDER — RITUXIMAB CHEMO INJECTION 500 MG/50ML
375.0000 mg/m2 | Freq: Once | INTRAVENOUS | Status: AC
  Administered 2016-06-21: 800 mg via INTRAVENOUS
  Filled 2016-06-21: qty 30

## 2016-06-21 MED ORDER — SODIUM CHLORIDE 0.9 % IV SOLN
Freq: Once | INTRAVENOUS | Status: AC
  Administered 2016-06-21: 12:00:00 via INTRAVENOUS

## 2016-06-21 NOTE — Progress Notes (Signed)
Patient Care Team: Selena Lor, Selena [name] as PCP - General  DIAGNOSIS:  Encounter Diagnosis  Name Primary?  . Malignant neoplasm of upper-outer quadrant of right breast in female, estrogen receptor positive ([place])     SUMMARY OF ONCOLOGIC HISTORY:   Breast cancer of upper-outer quadrant of right female breast ([place])   04/14/2011 Surgery    Right mastectomy: 2 foci of invasive ductal carcinoma 0.2 cm, grade 1 with background extensive DCIS, 0/5 lymph nodes negative,focus 1: ER 94%, PR 100%, Ki-67 5%, HER-2 negative: Focus to ER 100%BR 6% Ki-67 29% HER-2 negative      05/02/2011 -  Anti-estrogen oral therapy    letrozole 2.5 mg daily       Follicular 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 ([name])   07/24/2014 Initial Diagnosis    Retroperitoneal mass biopsy left side: Follicular lymphoma low-grade grade 1-2, flow cytometry positive for CD10, 20, 3, 10, 5, BCL-2, BCl-6, CD21, Ki-67 less than 10%      08/13/2014 -  Chemotherapy    Bendamustine and Rituxan day 1, 2 every 4 weeks 6 cycles followed by Rituxan maintenance every 2 months 2 years      11/03/2014 Imaging    midpoint of chemotherapy: Moderate improvement in the periaortic lymphadenopathy without complete resolution      01/16/2015 PET scan     interval decrease in size and metabolic activity of the large left retroperitoneal mass , no increase in activity, size 10 mm decreased from 48 mm  SUV 1.9 , no additional hypermetabolic activity       CHIEF COMPLIANT: Follow-up on maintenance rituximab  INTERVAL HISTORY: Selena [name] is a 81 year old with above-mentioned history of follicular lymphoma and a prior history of breast cancer. She is currently on maintenance Rituxan. She has been getting it every 2 months. She has been tolerating it extremely well. Denies any problems from the Rituxan. Denies any reactions. She denies any new signs or symptoms of lymphoma relapse. She is also on letrozole therapy for breast  cancer since April 2013. She has completed 5 years of therapy. Recently she had tendinitis of her bilateral knees and we prescribe her narcotics for a short period but she didn't take more then 2 tablets. It appears that the inflammation still present. It makes it difficult sometimes to walk.  REVIEW OF SYSTEMS:   Constitutional: Denies fevers, chills or abnormal weight loss Eyes: Denies blurriness of vision Ears, nose, mouth, throat, and face: Denies mucositis or sore throat Respiratory: Denies cough, dyspnea or wheezes Cardiovascular: Denies palpitation, chest discomfort Gastrointestinal:  Denies nausea, heartburn or change in bowel habits Skin: Denies abnormal skin rashes Lymphatics: Denies new lymphadenopathy or easy bruising Neurological:Denies numbness, tingling or new weaknesses Behavioral/Psych: Mood is stable, no new changes  Extremities: Bilateral knee pain from tendinitis Breast:  denies any pain or lumps or nodules in either breasts All other systems were reviewed with the patient and are negative.  I have reviewed the past medical history, past surgical history, social history and family history with the patient and they are unchanged from previous note.  ALLERGIES:  is allergic to codeine and codeine phosphate.  MEDICATIONS:  Current Outpatient Prescriptions  Medication Sig Dispense Refill  . cholecalciferol (VITAMIN D) 1000 UNITS tablet Take 1,000 Units by mouth daily.    . diclofenac (VOLTAREN) 75 MG EC tablet TAKE 1 TABLET BY MOUTH TWICE DAILY 180 tablet 3  . lidocaine-prilocaine (EMLA) cream APP EXT AA ONCE UTD  3  .  meclizine (ANTIVERT) 25 MG tablet Take 1 tablet (25 mg total) by mouth every 6 (six) hours as needed for dizziness. 60 tablet 0  . trimethoprim-polymyxin b (POLYTRIM) ophthalmic solution Place 2 drops into the left eye every 4 (four) hours. 10 mL 0  . UNABLE TO FIND Rx: L8000- Post Surgical Bras (Quantity: 6) [id number]- Silicone Breast Prosthesis (Quantity:  1) Dx: 174.9; Right Mastectomy 1 each 0   No current facility-administered medications for this visit.     PHYSICAL EXAMINATION: ECOG PERFORMANCE STATUS: 1 - Symptomatic but completely ambulatory  Vitals:   06/21/16 1054  BP: (!) 176/77  Pulse: 75  Resp: 18  Temp: 97.9 F (36.6 C)   Filed Weights   06/21/16 1054  Weight: 206 lb 11.2 oz (93.8 kg)    GENERAL:alert, no distress and comfortable SKIN: skin color, texture, turgor are normal, no rashes or significant lesions EYES: normal, Conjunctiva are pink and non-injected, sclera clear OROPHARYNX:no exudate, no erythema and lips, buccal mucosa, and tongue normal  NECK: supple, thyroid normal size, non-tender, without nodularity LYMPH:  no palpable lymphadenopathy in the cervical, axillary or inguinal LUNGS: clear to auscultation and percussion with normal breathing effort HEART: regular rate & rhythm and no murmurs and no lower extremity edema ABDOMEN:abdomen soft, non-tender and normal bowel sounds MUSCULOSKELETAL:no cyanosis of digits and no clubbing  NEURO: alert & oriented x 3 with fluent speech, no focal motor/sensory deficits EXTREMITIES: No lower extremity edema BREAST: No palpable masses or nodules in either right or left breasts. No palpable axillary supraclavicular or infraclavicular adenopathy no breast tenderness or nipple discharge. (exam performed in the presence of a chaperone)  LABORATORY DATA:  I have reviewed the data as listed   Chemistry      Component Value Date/Time   NA 143 06/21/2016 1029   K 4.3 06/21/2016 1029   CL 106 08/12/2013 0923   CL 107 04/05/2012 1253   CO2 27 06/21/2016 1029   BUN 27.7 (H) 06/21/2016 1029   CREATININE 1.0 06/21/2016 1029      Component Value Date/Time   CALCIUM 9.7 06/21/2016 1029   ALKPHOS 100 06/21/2016 1029   AST 36 (H) 06/21/2016 1029   ALT 52 06/21/2016 1029   BILITOT 0.68 06/21/2016 1029       Lab Results  Component Value Date   WBC 12.2 (H) 06/21/2016    HGB 14.8 06/21/2016   HCT 43.4 06/21/2016   MCV 98.6 06/21/2016   PLT 174 06/21/2016   NEUTROABS 10.4 (H) 06/21/2016    ASSESSMENT & PLAN:  Breast cancer of upper-outer quadrant of right female breast ([place]) Right breast cancer: Right mastectomy 04/14/2011: 2 foci of invasive ductal carcinoma 0.2 cm, grade 1 with background extensive DCIS, 0/5 lymph nodes negative,focus 1: ER 94%, PR 100%, Ki-67 5%, HER-2 negative: Focus to ER 100% PR 6% Ki-67 29% HER-2 negative Currently on Femara 2.5 mg daily Since 2013. Because her primary tumor was very small and low risk, we did not recommend extended adjuvant therapy.  Breast Cancer Surveillance: 1. Breast exam 02/24/16: Right mastectomy 2. Mammogram 06/05/2017No abnormalities. Postsurgical changes. Breast Density Category B. I recommended that she get 3-D mammograms for surveillance. Discussed the differences between different breast density categories.  Tendinitis: Currently on diclofenac ear since we discontinued aromatase immunotherapy her tendinitis could improve.  Follicular 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 Retroperitoneal mass biopsy left side 72/53/6644: Follicular lymphoma low-grade grade 1-2, flow cytometry positive for CD10, 20, 3, 10, 5, BCL-2, BCl-6, CD21,  Ki-67 less than 10%, stage IB (8X 4 cm mass)  Treatment plan:systemic chemotherapy with bendamustine and Rituxan 6 cycles ( From 08/13/2014 to 01/01/2015) followed by Rituxan maintenance every 2 months for 2 years  PET/CT scan 01/16/2015: Interval decrease in size and metabolic activity of the large left retroperitoneal mass , no increase in activity, size 10 mm decreased from 48 mm SUV 1.9 , no additional hypermetabolic activity I discussed with her that in the future scans will be done on an as-needed basis. There is no role of routine CT scans for surveillance for follicular  lymphoma. ------------------------------------------------------------------------------------------------------------------------------------------ Plan: 1. Continue maintenance therapy with Rituxan every 2 months for 2 years Started02/02/2015. 2. Return to clinic with Rituxan maintenance in 2 months she will complete this November 2018 Monitoring closely for toxicities.  I spent 25 minutes talking to the patient of which more than half was spent in counseling and coordination of care.  No orders of the defined types were placed in this encounter.  The patient has a good understanding of the overall plan. she agrees with it. she will call with any problems that may develop before the next visit here.   Rulon Eisenmenger, Selena [name] 06/21/16

## 2016-06-21 NOTE — Patient Instructions (Signed)
[facility] Discharge Instructions for Patients Receiving Chemotherapy  Today you received the following chemotherapy agents Rituxan  To help prevent nausea and vomiting after your treatment, we encourage you to take your nausea medication    If you develop nausea and vomiting that is not controlled by your nausea medication, call the clinic.   BELOW ARE SYMPTOMS THAT SHOULD BE REPORTED IMMEDIATELY:  *FEVER GREATER THAN 100.5 F  *CHILLS WITH OR WITHOUT FEVER  NAUSEA AND VOMITING THAT IS NOT CONTROLLED WITH YOUR NAUSEA MEDICATION  *UNUSUAL SHORTNESS OF BREATH  *UNUSUAL BRUISING OR BLEEDING  TENDERNESS IN MOUTH AND THROAT WITH OR WITHOUT PRESENCE OF ULCERS  *URINARY PROBLEMS  *BOWEL PROBLEMS  UNUSUAL RASH Items with * indicate a potential emergency and should be followed up as soon as possible.  Feel free to call the clinic you have any questions or concerns. The clinic phone number is [phone].  Please show the CHEMO ALERT CARD at check-in to the Emergency Department and triage nurse.   

## 2016-06-23 ENCOUNTER — Telehealth: Payer: Self-pay | Admitting: Internal Medicine

## 2016-06-23 MED ORDER — DICLOFENAC SODIUM 75 MG PO TBEC: DELAYED_RELEASE_TABLET | ORAL | 3 refills | Status: DC

## 2016-06-23 NOTE — Telephone Encounter (Signed)
Pt request refill  diclofenac (VOLTAREN) 75 MG EC tablet  Pt has made her cpe for 7/31.  She uses this med for her arthritis.  [facility] [place], [address] AT [address]

## 2016-06-23 NOTE — Telephone Encounter (Signed)
Medication was refilled as requested by pt.  

## 2016-06-28 DIAGNOSIS — M545 Low back pain: Secondary | ICD-10-CM | POA: Diagnosis not present

## 2016-06-29 ENCOUNTER — Ambulatory Visit
Admission: RE | Admit: 2016-06-29 | Discharge: 2016-06-29 | Disposition: A | Discharge status: Home / Self Care | Payer: Medicare Other | Source: Ambulatory Visit | Attending: Internal Medicine | Admitting: Internal Medicine

## 2016-06-29 DIAGNOSIS — Z1231 Encounter for screening mammogram for malignant neoplasm of breast: Secondary | ICD-10-CM | POA: Diagnosis not present

## 2016-07-05 DIAGNOSIS — M545 Low back pain: Secondary | ICD-10-CM | POA: Diagnosis not present

## 2016-07-11 DIAGNOSIS — M48061 Spinal stenosis, lumbar region without neurogenic claudication: Secondary | ICD-10-CM | POA: Diagnosis not present

## 2016-07-11 DIAGNOSIS — M25561 Pain in right knee: Secondary | ICD-10-CM | POA: Diagnosis not present

## 2016-07-11 DIAGNOSIS — M545 Low back pain: Secondary | ICD-10-CM | POA: Diagnosis not present

## 2016-07-22 DIAGNOSIS — M545 Low back pain: Secondary | ICD-10-CM | POA: Diagnosis not present

## 2016-07-22 DIAGNOSIS — M48061 Spinal stenosis, lumbar region without neurogenic claudication: Secondary | ICD-10-CM | POA: Diagnosis not present

## 2016-08-09 DIAGNOSIS — M461 Sacroiliitis, not elsewhere classified: Secondary | ICD-10-CM | POA: Diagnosis not present

## 2016-08-09 DIAGNOSIS — M545 Low back pain: Secondary | ICD-10-CM | POA: Diagnosis not present

## 2016-08-09 DIAGNOSIS — M48061 Spinal stenosis, lumbar region without neurogenic claudication: Secondary | ICD-10-CM | POA: Diagnosis not present

## 2016-08-16 ENCOUNTER — Encounter: Payer: Self-pay | Admitting: Hematology and Oncology

## 2016-08-16 ENCOUNTER — Ambulatory Visit (HOSPITAL_BASED_OUTPATIENT_CLINIC_OR_DEPARTMENT_OTHER): Payer: Medicare Other

## 2016-08-16 ENCOUNTER — Other Ambulatory Visit (HOSPITAL_BASED_OUTPATIENT_CLINIC_OR_DEPARTMENT_OTHER): Payer: Medicare Other

## 2016-08-16 ENCOUNTER — Ambulatory Visit (HOSPITAL_BASED_OUTPATIENT_CLINIC_OR_DEPARTMENT_OTHER): Payer: Medicare Other | Admitting: Hematology and Oncology

## 2016-08-16 VITALS — BP 151/66 | HR 71 | Temp 97.7°F | Resp 17 | Ht 67.0 in | Wt 207.0 lb

## 2016-08-16 VITALS — BP 147/59 | HR 66 | Temp 97.2°F | Resp 18

## 2016-08-16 DIAGNOSIS — Z5112 Encounter for antineoplastic immunotherapy: Secondary | ICD-10-CM | POA: Diagnosis not present

## 2016-08-16 DIAGNOSIS — C8203 Follicular lymphoma grade I, intra-abdominal lymph nodes: Secondary | ICD-10-CM

## 2016-08-16 DIAGNOSIS — C50411 Malignant neoplasm of upper-outer quadrant of right female breast: Secondary | ICD-10-CM

## 2016-08-16 DIAGNOSIS — Z170 Estrogen receptor positive status [ER+]: Principal | ICD-10-CM

## 2016-08-16 LAB — COMPREHENSIVE METABOLIC PANEL
ALBUMIN: 3.7 g/dL (ref 3.5–5.0)
ALK PHOS: 110 U/L (ref 40–150)
ALT: 65 U/L — ABNORMAL HIGH (ref 0–55)
AST: 44 U/L — AB (ref 5–34)
Anion Gap: 10 mEq/L (ref 3–11)
BUN: 20.4 mg/dL (ref 7.0–26.0)
CALCIUM: 9.5 mg/dL (ref 8.4–10.4)
CHLORIDE: 109 meq/L (ref 98–109)
CO2: 25 mEq/L (ref 22–29)
Creatinine: 0.7 mg/dL (ref 0.6–1.1)
EGFR: 80 mL/min/{1.73_m2} — AB (ref >90)
Glucose: 124 mg/dl (ref 70–140)
POTASSIUM: 3.9 meq/L (ref 3.5–5.1)
Sodium: 144 mEq/L (ref 136–145)
Total Bilirubin: 0.65 mg/dL (ref 0.20–1.20)
Total Protein: 6.3 g/dL — ABNORMAL LOW (ref 6.4–8.3)

## 2016-08-16 LAB — CBC WITH DIFFERENTIAL/PLATELET
BASO%: 1.4 % (ref 0.0–2.0)
BASOS ABS: 0.1 10*3/uL (ref 0.0–0.1)
EOS ABS: 0.4 10*3/uL (ref 0.0–0.5)
EOS%: 5.9 % (ref 0.0–7.0)
HEMATOCRIT: 42.9 % (ref 34.8–46.6)
HEMOGLOBIN: 14.5 g/dL (ref 11.6–15.9)
LYMPH#: 0.6 10*3/uL — AB (ref 0.9–3.3)
LYMPH%: 10.6 % — ABNORMAL LOW (ref 14.0–49.7)
MCH: 33.3 pg (ref 25.1–34.0)
MCHC: 33.9 g/dL (ref 31.5–36.0)
MCV: 98.2 fL (ref 79.5–101.0)
MONO#: 0.7 10*3/uL (ref 0.1–0.9)
MONO%: 11.7 % (ref 0.0–14.0)
NEUT#: 4.3 10*3/uL (ref 1.5–6.5)
NEUT%: 70.4 % (ref 38.4–76.8)
Platelets: 156 10*3/uL (ref 145–400)
RBC: 4.36 10*6/uL (ref 3.70–5.45)
RDW: 13.2 % (ref 11.2–14.5)
WBC: 6.1 10*3/uL (ref 3.9–10.3)

## 2016-08-16 MED ORDER — ACETAMINOPHEN 325 MG PO TABS
650.0000 mg | ORAL_TABLET | Freq: Once | ORAL | Status: AC
  Administered 2016-08-16: 650 mg via ORAL

## 2016-08-16 MED ORDER — HEPARIN SOD (PORK) LOCK FLUSH 100 UNIT/ML IV SOLN
500.0000 [IU] | Freq: Once | INTRAVENOUS | Status: AC | PRN
  Administered 2016-08-16: 500 [IU]
  Filled 2016-08-16: qty 5

## 2016-08-16 MED ORDER — DIPHENHYDRAMINE HCL 25 MG PO CAPS
50.0000 mg | ORAL_CAPSULE | Freq: Once | ORAL | Status: AC
  Administered 2016-08-16: 50 mg via ORAL

## 2016-08-16 MED ORDER — SODIUM CHLORIDE 0.9 % IJ SOLN
10.0000 mL | INTRAMUSCULAR | Status: DC | PRN
  Administered 2016-08-16: 10 mL
  Filled 2016-08-16: qty 10

## 2016-08-16 MED ORDER — SODIUM CHLORIDE 0.9 % IV SOLN
375.0000 mg/m2 | Freq: Once | INTRAVENOUS | Status: AC
  Administered 2016-08-16: 800 mg via INTRAVENOUS
  Filled 2016-08-16: qty 50

## 2016-08-16 MED ORDER — SODIUM CHLORIDE 0.9 % IV SOLN
Freq: Once | INTRAVENOUS | Status: AC
  Administered 2016-08-16: 12:00:00 via INTRAVENOUS

## 2016-08-16 MED ORDER — DIPHENHYDRAMINE HCL 25 MG PO CAPS
ORAL_CAPSULE | ORAL | Status: AC
  Filled 2016-08-16: qty 2

## 2016-08-16 MED ORDER — ACETAMINOPHEN 325 MG PO TABS
ORAL_TABLET | ORAL | Status: AC
  Filled 2016-08-16: qty 2

## 2016-08-16 NOTE — Assessment & Plan Note (Signed)
Right breast cancer: Right mastectomy 04/14/2011: 2 foci of invasive ductal carcinoma 0.2 cm, grade 1 with background extensive DCIS, 0/5 lymph nodes negative,focus 1: ER 94%, PR 100%, Ki-67 5%, HER-2 negative: Focus to ER 100% PR 6% Ki-67 29% HER-2 negative Femara 2.5 mg daily from 2013- 2018. Because her primary tumor was very small and low risk, we did not recommend extended adjuvant therapy.  Breast Cancer Surveillance: 1. Breast exam 02/24/16: Right mastectomy 2. Mammogram 06/05/2017No abnormalities. Postsurgical changes. Breast Density Category B.

## 2016-08-16 NOTE — Assessment & Plan Note (Signed)
Retroperitoneal mass biopsy left side 65/80/0634: Follicular lymphoma low-grade grade 1-2, flow cytometry positive for CD10, 20, 3, 10, 5, BCL-2, BCl-6, CD21, Ki-67 less than 10%, stage IB (8X 4 cm mass)  Treatment plan:systemic chemotherapy with bendamustine and Rituxan 6 cycles ( From 08/13/2014 to 01/01/2015) followed by Rituxan maintenance every 2 months for 2 years  PET/CT scan 01/16/2015: Interval decrease in size and metabolic activity of the large left retroperitoneal mass , no increase in activity, size 10 mm decreased from 48 mm SUV 1.9 , no additional hypermetabolic activity I discussed with her that in the future scans will be done on an as-needed basis. There is no role of routine CT scans for surveillance for follicular lymphoma. ------------------------------------------------------------------------------------------------------------------------------------------ Plan: 1. Continue maintenance therapy with Rituxan every 2 months for 2 years Started02/02/2015. 2. Return to clinic with Rituxan maintenance in 2 months she will complete this November 2018 Monitoring closely for toxicities.

## 2016-08-16 NOTE — Patient Instructions (Signed)
[facility] Discharge Instructions for Patients Receiving Chemotherapy  Today you received the following chemotherapy agents Rituxan To help prevent nausea and vomiting after your treatment, we encourage you to take your nausea medication as prescribed.  If you develop nausea and vomiting that is not controlled by your nausea medication, call the clinic.   BELOW ARE SYMPTOMS THAT SHOULD BE REPORTED IMMEDIATELY:  *FEVER GREATER THAN 100.5 F  *CHILLS WITH OR WITHOUT FEVER  NAUSEA AND VOMITING THAT IS NOT CONTROLLED WITH YOUR NAUSEA MEDICATION  *UNUSUAL SHORTNESS OF BREATH  *UNUSUAL BRUISING OR BLEEDING  TENDERNESS IN MOUTH AND THROAT WITH OR WITHOUT PRESENCE OF ULCERS  *URINARY PROBLEMS  *BOWEL PROBLEMS  UNUSUAL RASH Items with * indicate a potential emergency and should be followed up as soon as possible.  Feel free to call the clinic you have any questions or concerns. The clinic phone number is [phone].  Please show the CHEMO ALERT CARD at check-in to the Emergency Department and triage nurse.   

## 2016-08-16 NOTE — Progress Notes (Signed)
Patient Care Team: Marletta Lor, MD as PCP - General  DIAGNOSIS:  Encounter Diagnoses  Name Primary?  . Malignant neoplasm of upper-outer quadrant of right breast in female, estrogen receptor positive ([place]) Yes  . Follicular 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 ([place])     SUMMARY OF ONCOLOGIC HISTORY:   Breast cancer of upper-outer quadrant of right female breast ([place])   04/14/2011 Surgery    Right mastectomy: 2 foci of invasive ductal carcinoma 0.2 cm, grade 1 with background extensive DCIS, 0/5 lymph nodes negative,focus 1: ER 94%, PR 100%, Ki-67 5%, HER-2 negative: Focus to ER 100%BR 6% Ki-67 29% HER-2 negative      05/02/2011 -  Anti-estrogen oral therapy    letrozole 2.5 mg daily       Follicular 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 ([name])   07/24/2014 Initial Diagnosis    Retroperitoneal mass biopsy left side: Follicular lymphoma low-grade grade 1-2, flow cytometry positive for CD10, 20, 3, 10, 5, BCL-2, BCl-6, CD21, Ki-67 less than 10%      08/13/2014 -  Chemotherapy    Bendamustine and Rituxan day 1, 2 every 4 weeks 6 cycles followed by Rituxan maintenance every 2 months 2 years      11/03/2014 Imaging    midpoint of chemotherapy: Moderate improvement in the periaortic lymphadenopathy without complete resolution      01/16/2015 PET scan     interval decrease in size and metabolic activity of the large left retroperitoneal mass , no increase in activity, size 10 mm decreased from 48 mm  SUV 1.9 , no additional hypermetabolic activity       CHIEF COMPLIANT: Rituxan maintenance therapy  INTERVAL HISTORY: Selena [name] is a 81 year old lady with above-mentioned history of follicular lymphoma who is here for Rituxan maintenance therapy. Recently she had back pain issues for which she received an injection which resolved her back pain. She is very happy about that. However recently she also had a vertigo on an escalator and scraped her legs.  Luckily her daughter was there to catch her.  REVIEW OF SYSTEMS:   Constitutional: Denies fevers, chills or abnormal weight loss Eyes: Denies blurriness of vision Ears, nose, mouth, throat, and face: Denies mucositis or sore throat Respiratory: Denies cough, dyspnea or wheezes Cardiovascular: Denies palpitation, chest discomfort Gastrointestinal:  Denies nausea, heartburn or change in bowel habits Skin: Denies abnormal skin rashes Lymphatics: Denies new lymphadenopathy or easy bruising Neurological:Denies numbness, tingling or new weaknesses Behavioral/Psych: Mood is stable, no new changes  Extremities: No lower extremity edema Breast:  denies any pain or lumps or nodules in either breasts All other systems were reviewed with the patient and are negative.  I have reviewed the past medical history, past surgical history, social history and family history with the patient and they are unchanged from previous note.  ALLERGIES:  is allergic to codeine and codeine phosphate.  MEDICATIONS:  Current Outpatient Prescriptions  Medication Sig Dispense Refill  . diclofenac (VOLTAREN) 75 MG EC tablet TAKE 1 TABLET BY MOUTH TWICE DAILY 180 tablet 3  . lidocaine-prilocaine (EMLA) cream APP EXT AA ONCE UTD  3  . meclizine (ANTIVERT) 25 MG tablet Take 1 tablet (25 mg total) by mouth every 6 (six) hours as needed for dizziness. 60 tablet 0  . UNABLE TO FIND Rx: L8000- Post Surgical Bras (Quantity: 6) Q6578- Silicone Breast Prosthesis (Quantity: 1) Dx: 174.9; Right Mastectomy 1 each 0   No current facility-administered medications for this  visit.    Facility-Administered Medications Ordered in Other Visits  Medication Dose Route Frequency Provider Last Rate Last Dose  . heparin lock flush 100 unit/mL  500 Units Intracatheter Once PRN Nicholas Lose, MD      . sodium chloride 0.9 % injection 10 mL  10 mL Intracatheter PRN Nicholas Lose, MD        PHYSICAL EXAMINATION: ECOG PERFORMANCE STATUS: 1 -  Symptomatic but completely ambulatory  Vitals:   08/16/16 1118  BP: (!) 151/66  Pulse: 71  Resp: 17  Temp: 97.7 F (36.5 C)   Filed Weights   08/16/16 1118  Weight: 207 lb (93.9 kg)    GENERAL:alert, no distress and comfortable SKIN: skin color, texture, turgor are normal, no rashes or significant lesions EYES: normal, Conjunctiva are pink and non-injected, sclera clear OROPHARYNX:no exudate, no erythema and lips, buccal mucosa, and tongue normal  NECK: supple, thyroid normal size, non-tender, without nodularity LYMPH:  no palpable lymphadenopathy in the cervical, axillary or inguinal LUNGS: clear to auscultation and percussion with normal breathing effort HEART: regular rate & rhythm and no murmurs and no lower extremity edema ABDOMEN:abdomen soft, non-tender and normal bowel sounds MUSCULOSKELETAL:no cyanosis of digits and no clubbing  NEURO: alert & oriented x 3 with fluent speech, no focal motor/sensory deficits EXTREMITIES: No lower extremity edema   LABORATORY DATA:  I have reviewed the data as listed   Chemistry      Component Value Date/Time   NA 144 08/16/2016 1054   K 3.9 08/16/2016 1054   CL 106 08/12/2013 0923   CL 107 04/05/2012 1253   CO2 25 08/16/2016 1054   BUN 20.4 08/16/2016 1054   CREATININE 0.7 08/16/2016 1054      Component Value Date/Time   CALCIUM 9.5 08/16/2016 1054   ALKPHOS 110 08/16/2016 1054   AST 44 (H) 08/16/2016 1054   ALT 65 (H) 08/16/2016 1054   BILITOT 0.65 08/16/2016 1054       Lab Results  Component Value Date   WBC 6.1 08/16/2016   HGB 14.5 08/16/2016   HCT 42.9 08/16/2016   MCV 98.2 08/16/2016   PLT 156 08/16/2016   NEUTROABS 4.3 08/16/2016    ASSESSMENT & PLAN:  Follicular 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 Retroperitoneal mass biopsy left side 53/66/4403: Follicular lymphoma low-grade grade 1-2, flow cytometry positive for CD10, 20, 3, 10, 5, BCL-2, BCl-6, CD21, Ki-67 less than 10%, stage IB (8X 4 cm  mass)  Treatment plan:systemic chemotherapy with bendamustine and Rituxan 6 cycles ( From 08/13/2014 to 01/01/2015) followed by Rituxan maintenance every 2 months for 2 years  PET/CT scan 01/16/2015: Interval decrease in size and metabolic activity of the large left retroperitoneal mass , no increase in activity, size 10 mm decreased from 48 mm SUV 1.9 , no additional hypermetabolic activity I discussed with her that in the future scans will be done on an as-needed basis. There is no role of routine CT scans for surveillance for follicular lymphoma. ------------------------------------------------------------------------------------------------------------------------------------------ Plan: 1. Continue maintenance therapy with Rituxan every 2 months for 2 years Started02/02/2015. 2. Return to clinic with Rituxan maintenance in 2 months she will complete this November 2018 Monitoring closely for toxicities.  Breast cancer of upper-outer quadrant of right female breast [facility]) Right breast cancer: Right mastectomy 04/14/2011: 2 foci of invasive ductal carcinoma 0.2 cm, grade 1 with background extensive DCIS, 0/5 lymph nodes negative,focus 1: ER 94%, PR 100%, Ki-67 5%, HER-2 negative: Focus to ER 100% PR 6% Ki-67  29% HER-2 negative Femara 2.5 mg daily from 2013- 2018. Because her primary tumor was very small and low risk, we did not recommend extended adjuvant therapy.  Breast Cancer Surveillance: 1. Breast exam 02/24/16: Right mastectomy 2. Mammogram 06/05/2017No abnormalities. Postsurgical changes. Breast Density Category B.  After she completes her treatment, she will go on every six-month checkups with labs and physical exams. I discussed with her that there is no role of routine CT scans.  I spent 25 minutes talking to the patient of which more than half was spent in counseling and coordination of care.  No orders of the defined types were placed in this encounter.  The  patient has a good understanding of the overall plan. she agrees with it. she will call with any problems that may develop before the next visit here.   Rulon Eisenmenger, MD 08/16/16

## 2016-08-23 ENCOUNTER — Encounter: Payer: Self-pay | Admitting: Internal Medicine

## 2016-08-23 ENCOUNTER — Ambulatory Visit (INDEPENDENT_AMBULATORY_CARE_PROVIDER_SITE_OTHER): Payer: Medicare Other | Admitting: Internal Medicine

## 2016-08-23 VITALS — BP 136/90 | HR 76 | Temp 98.0°F | Ht 67.0 in | Wt 208.8 lb

## 2016-08-23 DIAGNOSIS — R413 Other amnesia: Secondary | ICD-10-CM | POA: Diagnosis not present

## 2016-08-23 DIAGNOSIS — E785 Hyperlipidemia, unspecified: Secondary | ICD-10-CM

## 2016-08-23 DIAGNOSIS — I480 Paroxysmal atrial fibrillation: Secondary | ICD-10-CM | POA: Diagnosis not present

## 2016-08-23 DIAGNOSIS — C50411 Malignant neoplasm of upper-outer quadrant of right female breast: Secondary | ICD-10-CM

## 2016-08-23 DIAGNOSIS — Z0000 Encounter for general adult medical examination without abnormal findings: Secondary | ICD-10-CM | POA: Diagnosis not present

## 2016-08-23 LAB — LIPID PANEL
CHOLESTEROL: 204 mg/dL — AB (ref 0–200)
HDL: 40.3 mg/dL (ref >39.00)
NonHDL: 163.63
TRIGLYCERIDES: 202 mg/dL — AB (ref 0.0–149.0)
Total CHOL/HDL Ratio: 5
VLDL: 40.4 mg/dL — ABNORMAL HIGH (ref 0.0–40.0)

## 2016-08-23 LAB — TSH: TSH: 0.65 u[IU]/mL (ref 0.35–4.50)

## 2016-08-23 LAB — LDL CHOLESTEROL, DIRECT: Direct LDL: 133 mg/dL

## 2016-08-23 MED ORDER — ONDANSETRON HCL 4 MG PO TABS
4.0000 mg | ORAL_TABLET | Freq: Three times a day (TID) | ORAL | 0 refills | Status: DC | PRN
Start: 2016-08-23 — End: 2019-11-05

## 2016-08-23 NOTE — Progress Notes (Signed)
Subjective:    Patient ID: Selena [name], female    DOB: Jun 04, 1935, 81 y.o.   MRN: [id number]  HPI  81 year old patient who is seen today for an annual exam and Medicare wellness visit. She is followed closely by oncology with a remote history of right breast cancer.  She is undergoing treatment for follicular lymphoma of intra-abdominal left nodes. She has significant arthritis and is status post bilateral total knee replacements. Complaints include back pain, unsteadiness with periodic vertigo.  She also has a long history of memory impairment.  MMSE performed today 28/30  Colonoscopy 2012  Past Medical History:  Diagnosis Date  . Anemia    PMH  . Atrial fibrillation ([place]) 01/01/2007  . BACK PAIN, UPPER 07/09/2008  . Breast cancer, stage 1 ([place])   . Bruises easily   . Cataract   . Complication of anesthesia   . Diverticulitis   . DIVERTICULOSIS, COLON 12/26/2006  . Follicular non-Hodgkin's lymphoma ([place])   . Goiter    multi-nodular  . Hx of colonoscopy 2009  . LIVER FUNCTION TESTS, ABNORMAL, HX OF 12/26/2007  . Memory loss 05/16/2007  . OSTEOARTHRITIS 12/26/2006   takes Diclofenac daily but has stopped for surgery  . PONV (postoperative nausea and vomiting)   . Skin cancer   . VERTIGO 09/07/2009  . Vertigo    hx of;had Phenergan prn   . Wears dentures   . Wears glasses   . Wears hearing aid    bilateral     Social History   Social History  . Marital status: Widowed    Spouse name: N/A  . Number of children: N/A  . Years of education: N/A   Occupational History  . Not on file.   Social History Main Topics  . Smoking status: Former Research scientist (life sciences)  . Smokeless tobacco: Never Used  . Alcohol use No  . Drug use: No  . Sexual activity: Yes    Birth control/ protection: Surgical   Other Topics Concern  . Not on file   Social History Narrative  . No narrative on file    Past Surgical History:  Procedure Laterality Date  . ABDOMINAL HYSTERECTOMY    . ABLATION  OF DYSRHYTHMIC FOCUS  2009  . APPENDECTOMY    . AUGMENTATION MAMMAPLASTY Right 2013  . BREAST BIOPSY  2013  . BREAST RECONSTRUCTION  04/14/2011   Procedure: BREAST RECONSTRUCTION;  Surgeon: Crissie Reese, MD;  Location: [facility];  Service: Plastics;  Laterality: Right;  Placement of Right Breast Tissue Expander with  use of Flex HD for Breast Reconstruction  . BREAST SURGERY     right sm, snbx  . CATARACT EXTRACTION W/ INTRAOCULAR LENS  IMPLANT, BILATERAL  2010   bilateral  . COLONOSCOPY    . COLONOSCOPY    . KNEE SURGERY  2004/2010   arthroscopic/ bilateral knee replacements  . MASTECTOMY Right 2013  . PORTACATH PLACEMENT Right 08/11/2014   Procedure: INSERTION PORT-A-CATH WITH ULTRASOUND;  Surgeon: Rolm Bookbinder, MD;  Location: [place];  Service: General;  Laterality: Right;  . [place]   right  . tmi  1998  . TONSILLECTOMY      Family History  Problem Relation Age of Onset  . Colon cancer Mother   . Cancer Mother        colon  . Colon cancer Maternal Aunt   . Cancer Maternal Uncle   . Prostate cancer Maternal Uncle   . Prostate cancer Maternal Uncle   .  Other Maternal Uncle   . Cancer Sister        kidney  . Cancer Brother        lymph node  . Anesthesia problems Neg Hx   . Hypotension Neg Hx   . Malignant hyperthermia Neg Hx   . Pseudochol deficiency Neg Hx   . Breast cancer Neg Hx     Allergies  Allergen Reactions  . Codeine Other (See Comments)    Hallucinations   . Codeine Phosphate Other (See Comments)    Hallucinations    Current Outpatient Prescriptions on File Prior to Visit  Medication Sig Dispense Refill  . diclofenac (VOLTAREN) 75 MG EC tablet TAKE 1 TABLET BY MOUTH TWICE DAILY 180 tablet 3  . lidocaine-prilocaine (EMLA) cream APP EXT AA ONCE UTD  3  . meclizine (ANTIVERT) 25 MG tablet Take 1 tablet (25 mg total) by mouth every 6 (six) hours as needed for dizziness. 60 tablet 0  . UNABLE TO FIND Rx: L8000- Post Surgical Bras  (Quantity: 6) C9470- Silicone Breast Prosthesis (Quantity: 1) Dx: 174.9; Right Mastectomy 1 each 0   No current facility-administered medications on file prior to visit.     BP 136/90 (BP Location: Left Arm, Patient Position: Sitting, Cuff Size: Normal)   Pulse 76   Temp 98 F (36.7 C) (Oral)   Ht 5\' 7"  (1.702 m)   Wt 208 lb 12.8 oz (94.7 kg)   SpO2 97%   BMI 32.70 kg/m   Medicare wellness visit  1. Risk factors, based on past  M,S,F history. No major Cardiovascular risk factors include  2.  Physical activities: walks daily, but limited due to arthritis and unsteady gait  3.  Depression/mood:no history of major depression or mood disorder  4.  Hearing:uses bilateral hearing aids  5.  ADL's:independent. Still drives  6.  Fall risk:moderate due to unsteady gait and arthritis involving both knees  7.  Home safety:no problems identified  8.  Height weight, and visual acuity;height and weight stable no change in visual acuity  9.  Counseling:modest weight loss encouraged more rigorous physical activity recommended  10. Lab orders based on risk factors:we'll check a lipid profile and TSH  11. Referral :follow-up oncology  12. Care plan:continue efforts at aggressive risk factor modification  13. Cognitive assessment: alert and oriented with normal affect.  Patient complains of some memory loss.  Her family complains that she frequently must repeat herself.  She describes herself as quite forgetful.  MMSE 28/30  14. Screening: Patient provided with a written and personalized 5-10 year screening schedule in the AVS.    15. Provider List Update:primary care oncology orthopedics    Review of Systems  Constitutional: Positive for activity change.  HENT: Negative for congestion, dental problem, hearing loss, rhinorrhea, sinus pressure, sore throat and tinnitus.   Eyes: Negative for pain, discharge and visual disturbance.  Respiratory: Negative for cough and shortness of  breath.   Cardiovascular: Negative for chest pain, palpitations and leg swelling.  Gastrointestinal: Negative for abdominal distention, abdominal pain, blood in stool, constipation, diarrhea, nausea and vomiting.  Genitourinary: Negative for difficulty urinating, dysuria, flank pain, frequency, hematuria, pelvic pain, urgency, vaginal bleeding, vaginal discharge and vaginal pain.  Musculoskeletal: Positive for arthralgias, back pain and gait problem. Negative for joint swelling.  Skin: Negative for rash.  Neurological: Positive for dizziness and light-headedness. Negative for syncope, speech difficulty, weakness, numbness and headaches.  Hematological: Negative for adenopathy.  Psychiatric/Behavioral: Positive for confusion and decreased concentration.  Negative for agitation, behavioral problems and dysphoric mood. The patient is not nervous/anxious.        Objective:   Physical Exam  Constitutional: She is oriented to person, place, and time. She appears well-developed and well-nourished.  Weight 208 Blood pressure 130/84  HENT:  Head: Normocephalic and atraumatic.  Right Ear: External ear normal.  Left Ear: External ear normal.  Mouth/Throat: Oropharynx is clear and moist.  Dentures in place Bilateral hearing aids in place  Eyes: Conjunctivae and EOM are normal.  Neck: Normal range of motion. Neck supple. No JVD present. No thyromegaly present.  Cardiovascular: Normal rate, regular rhythm, normal heart sounds and intact distal pulses.   No murmur heard. Pulmonary/Chest: Effort normal and breath sounds normal. She has no wheezes. She has no rales.  Port-A-Cath present, right upper anterior chest wall Status post right mastectomy with implant  Abdominal: Soft. Bowel sounds are normal. She exhibits no distension and no mass. There is no tenderness. There is no rebound and no guarding.  Lower midline scar  Musculoskeletal: Normal range of motion. She exhibits edema. She exhibits no  tenderness.  Status post bilateral total knee replacement surgery  Neurological: She is alert and oriented to person, place, and time. She has normal reflexes. No cranial nerve deficit. She exhibits normal muscle tone. Coordination normal.  Decrease [employer]choice.  Sensation distally  Skin: Skin is warm and dry. No rash noted.  Psychiatric: She has a normal mood and affect. Her behavior is normal.          Assessment & Plan:  Preventive health exam Medicare wellness visit History of paroxysmal atrial fibrillation Memory loss.  This appears be mild and nonprogressive.  MMSE today 28/30 Osteoarthritis.  Status post bilateral total knee replacement History of right breast cancer History of follicular lymphoma intra-abdominal lymph nodes  We'll review a lipid profile and TSH Follow-up oncology Modest weight loss and more rigorous physical activity encouraged  Nyoka Cowden

## 2016-08-23 NOTE — Patient Instructions (Signed)
Oncology follow-up as scheduled    It is important that you exercise regularly, at least 20 minutes 3 to 4 times per week.  If you develop chest pain or shortness of breath seek  medical attention.  Return in one year for follow-up

## 2016-10-11 ENCOUNTER — Other Ambulatory Visit (HOSPITAL_BASED_OUTPATIENT_CLINIC_OR_DEPARTMENT_OTHER): Payer: Medicare Other

## 2016-10-11 ENCOUNTER — Ambulatory Visit (HOSPITAL_BASED_OUTPATIENT_CLINIC_OR_DEPARTMENT_OTHER): Payer: Medicare Other | Admitting: Hematology and Oncology

## 2016-10-11 ENCOUNTER — Telehealth: Payer: Self-pay | Admitting: Hematology and Oncology

## 2016-10-11 ENCOUNTER — Encounter: Payer: Self-pay | Admitting: Hematology and Oncology

## 2016-10-11 ENCOUNTER — Ambulatory Visit (HOSPITAL_BASED_OUTPATIENT_CLINIC_OR_DEPARTMENT_OTHER): Payer: Medicare Other

## 2016-10-11 VITALS — BP 134/64 | HR 65 | Temp 97.5°F | Resp 18

## 2016-10-11 DIAGNOSIS — Z170 Estrogen receptor positive status [ER+]: Secondary | ICD-10-CM

## 2016-10-11 DIAGNOSIS — C50411 Malignant neoplasm of upper-outer quadrant of right female breast: Secondary | ICD-10-CM

## 2016-10-11 DIAGNOSIS — C8203 Follicular lymphoma grade I, intra-abdominal lymph nodes: Secondary | ICD-10-CM

## 2016-10-11 DIAGNOSIS — Z5112 Encounter for antineoplastic immunotherapy: Secondary | ICD-10-CM

## 2016-10-11 LAB — CBC WITH DIFFERENTIAL/PLATELET
BASO%: 1 % (ref 0.0–2.0)
Basophils Absolute: 0.1 10*3/uL (ref 0.0–0.1)
EOS ABS: 0.3 10*3/uL (ref 0.0–0.5)
EOS%: 4.8 % (ref 0.0–7.0)
HCT: 42.1 % (ref 34.8–46.6)
HGB: 14.2 g/dL (ref 11.6–15.9)
LYMPH%: 13.9 % — ABNORMAL LOW (ref 14.0–49.7)
MCH: 33.6 pg (ref 25.1–34.0)
MCHC: 33.7 g/dL (ref 31.5–36.0)
MCV: 99.5 fL (ref 79.5–101.0)
MONO#: 1 10*3/uL — AB (ref 0.1–0.9)
MONO%: 16.7 % — AB (ref 0.0–14.0)
NEUT%: 63.6 % (ref 38.4–76.8)
NEUTROS ABS: 3.9 10*3/uL (ref 1.5–6.5)
PLATELETS: 161 10*3/uL (ref 145–400)
RBC: 4.23 10*6/uL (ref 3.70–5.45)
RDW: 13.1 % (ref 11.2–14.5)
WBC: 6.1 10*3/uL (ref 3.9–10.3)
lymph#: 0.9 10*3/uL (ref 0.9–3.3)

## 2016-10-11 LAB — COMPREHENSIVE METABOLIC PANEL
ALT: 80 U/L — ABNORMAL HIGH (ref 0–55)
ANION GAP: 8 meq/L (ref 3–11)
AST: 67 U/L — ABNORMAL HIGH (ref 5–34)
Albumin: 3.9 g/dL (ref 3.5–5.0)
Alkaline Phosphatase: 107 U/L (ref 40–150)
BILIRUBIN TOTAL: 0.8 mg/dL (ref 0.20–1.20)
BUN: 19 mg/dL (ref 7.0–26.0)
CO2: 26 meq/L (ref 22–29)
Calcium: 9.8 mg/dL (ref 8.4–10.4)
Chloride: 109 mEq/L (ref 98–109)
Creatinine: 0.8 mg/dL (ref 0.6–1.1)
EGFR: 76 mL/min/{1.73_m2} — AB (ref >90)
GLUCOSE: 86 mg/dL (ref 70–140)
POTASSIUM: 4.2 meq/L (ref 3.5–5.1)
SODIUM: 142 meq/L (ref 136–145)
TOTAL PROTEIN: 6.5 g/dL (ref 6.4–8.3)

## 2016-10-11 MED ORDER — SODIUM CHLORIDE 0.9 % IJ SOLN
10.0000 mL | INTRAMUSCULAR | Status: DC | PRN
Start: 2016-10-11 — End: 2016-10-11
  Administered 2016-10-11: 10 mL
  Filled 2016-10-11: qty 10

## 2016-10-11 MED ORDER — SODIUM CHLORIDE 0.9 % IV SOLN
375.0000 mg/m2 | Freq: Once | INTRAVENOUS | Status: AC
  Administered 2016-10-11: 800 mg via INTRAVENOUS
  Filled 2016-10-11: qty 30

## 2016-10-11 MED ORDER — DIPHENHYDRAMINE HCL 25 MG PO CAPS
ORAL_CAPSULE | ORAL | Status: AC
  Filled 2016-10-11: qty 2

## 2016-10-11 MED ORDER — HEPARIN SOD (PORK) LOCK FLUSH 100 UNIT/ML IV SOLN
500.0000 [IU] | Freq: Once | INTRAVENOUS | Status: AC | PRN
  Administered 2016-10-11: 500 [IU]
  Filled 2016-10-11: qty 5

## 2016-10-11 MED ORDER — SODIUM CHLORIDE 0.9 % IV SOLN
Freq: Once | INTRAVENOUS | Status: AC
  Administered 2016-10-11: 12:00:00 via INTRAVENOUS

## 2016-10-11 MED ORDER — ACETAMINOPHEN 325 MG PO TABS
650.0000 mg | ORAL_TABLET | Freq: Once | ORAL | Status: AC
  Administered 2016-10-11: 650 mg via ORAL

## 2016-10-11 MED ORDER — DIPHENHYDRAMINE HCL 25 MG PO CAPS
50.0000 mg | ORAL_CAPSULE | Freq: Once | ORAL | Status: AC
  Administered 2016-10-11: 50 mg via ORAL

## 2016-10-11 MED ORDER — ACETAMINOPHEN 325 MG PO TABS
ORAL_TABLET | ORAL | Status: AC
  Filled 2016-10-11: qty 2

## 2016-10-11 NOTE — Progress Notes (Signed)
Patient Care Team: Marletta Lor, MD as PCP - General  DIAGNOSIS:  Encounter Diagnoses  Name Primary?  . Follicular 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 ([place])   . Malignant neoplasm of upper-outer quadrant of right breast in female, estrogen receptor positive ([place])     SUMMARY OF ONCOLOGIC HISTORY:   Breast cancer of upper-outer quadrant of right female breast ([place])   04/14/2011 Surgery    Right mastectomy: 2 foci of invasive ductal carcinoma 0.2 cm, grade 1 with background extensive DCIS, 0/5 lymph nodes negative,focus 1: ER 94%, PR 100%, Ki-67 5%, HER-2 negative: Focus to ER 100%BR 6% Ki-67 29% HER-2 negative      05/02/2011 -  Anti-estrogen oral therapy    letrozole 2.5 mg daily       Follicular 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 ([name])   07/24/2014 Initial Diagnosis    Retroperitoneal mass biopsy left side: Follicular lymphoma low-grade grade 1-2, flow cytometry positive for CD10, 20, 3, 10, 5, BCL-2, BCl-6, CD21, Ki-67 less than 10%      08/13/2014 -  Chemotherapy    Bendamustine and Rituxan day 1, 2 every 4 weeks 6 cycles followed by Rituxan maintenance every 2 months 2 years      11/03/2014 Imaging    midpoint of chemotherapy: Moderate improvement in the periaortic lymphadenopathy without complete resolution      01/16/2015 PET scan     interval decrease in size and metabolic activity of the large left retroperitoneal mass , no increase in activity, size 10 mm decreased from 48 mm  SUV 1.9 , no additional hypermetabolic activity       CHIEF COMPLIANT:  Rituxan maintenance therapy  INTERVAL HISTORY: Selena [name] is a  81 year old with above-mentioned history of follicular lymphoma who is  On Rituxan maintenance therapy. She has one more dose of Rituxan maintenance.  She reports no major issues or concerns with the Rituxan. She feels well and denies any lumps or nodules.   REVIEW OF SYSTEMS:   Constitutional: Denies fevers, chills or  abnormal weight loss Eyes: Denies blurriness of vision Ears, nose, mouth, throat, and face: Denies mucositis or sore throat Respiratory: Denies cough, dyspnea or wheezes Cardiovascular: Denies palpitation, chest discomfort Gastrointestinal:  Denies nausea, heartburn or change in bowel habits Skin: Denies abnormal skin rashes Lymphatics: Denies new lymphadenopathy or easy bruising Neurological:Denies numbness, tingling or new weaknesses Behavioral/Psych: Mood is stable, no new changes  Extremities: No lower extremity edema Breast:  denies any pain or lumps or nodules in either breasts All other systems were reviewed with the patient and are negative.  I have reviewed the past medical history, past surgical history, social history and family history with the patient and they are unchanged from previous note.  ALLERGIES:  is allergic to codeine and codeine phosphate.  MEDICATIONS:  Current Outpatient Prescriptions  Medication Sig Dispense Refill  . diclofenac (VOLTAREN) 75 MG EC tablet TAKE 1 TABLET BY MOUTH TWICE DAILY 180 tablet 3  . lidocaine-prilocaine (EMLA) cream APP EXT AA ONCE UTD  3  . meclizine (ANTIVERT) 25 MG tablet Take 1 tablet (25 mg total) by mouth every 6 (six) hours as needed for dizziness. 60 tablet 0  . ondansetron (ZOFRAN) 4 MG tablet Take 1 tablet (4 mg total) by mouth every 8 (eight) hours as needed for nausea or vomiting. 20 tablet 0  . UNABLE TO FIND Rx: L8000- Post Surgical Bras (Quantity: 6) O9629- Silicone Breast Prosthesis (Quantity: 1) Dx: 528.9;  Right Mastectomy 1 each 0   No current facility-administered medications for this visit.    Facility-Administered Medications Ordered in Other Visits  Medication Dose Route Frequency Provider Last Rate Last Dose  . heparin lock flush 100 unit/mL  500 Units Intracatheter Once PRN Nicholas Lose, MD      . sodium chloride 0.9 % injection 10 mL  10 mL Intracatheter PRN Nicholas Lose, MD        PHYSICAL  EXAMINATION: ECOG PERFORMANCE STATUS: 1 - Symptomatic but completely ambulatory  Vitals:   10/11/16 1101  BP: (!) 170/67  Pulse: 66  Resp: 16  Temp: 97.8 F (36.6 C)  SpO2: 99%   Filed Weights   10/11/16 1101  Weight: 209 lb 12.8 oz (95.2 kg)    GENERAL:alert, no distress and comfortable SKIN: skin color, texture, turgor are normal, no rashes or significant lesions EYES: normal, Conjunctiva are pink and non-injected, sclera clear OROPHARYNX:no exudate, no erythema and lips, buccal mucosa, and tongue normal  NECK: supple, thyroid normal size, non-tender, without nodularity LYMPH:  no palpable lymphadenopathy in the cervical, axillary or inguinal LUNGS: clear to auscultation and percussion with normal breathing effort HEART: regular rate & rhythm and no murmurs and no lower extremity edema ABDOMEN:abdomen soft, non-tender and normal bowel sounds MUSCULOSKELETAL:no cyanosis of digits and no clubbing  NEURO: alert & oriented x 3 with fluent speech, no focal motor/sensory deficits EXTREMITIES: No lower extremity edema BREAST: No palpable masses or nodules in either right or left breasts. No palpable axillary supraclavicular or infraclavicular adenopathy no breast tenderness or nipple discharge. (exam performed in the presence of a chaperone)  LABORATORY DATA:  I have reviewed the data as listed   Chemistry      Component Value Date/Time   NA 142 10/11/2016 1042   K 4.2 10/11/2016 1042   CL 106 08/12/2013 0923   CL 107 04/05/2012 1253   CO2 26 10/11/2016 1042   BUN 19.0 10/11/2016 1042   CREATININE 0.8 10/11/2016 1042      Component Value Date/Time   CALCIUM 9.8 10/11/2016 1042   ALKPHOS 107 10/11/2016 1042   AST 67 (H) 10/11/2016 1042   ALT 80 (H) 10/11/2016 1042   BILITOT 0.80 10/11/2016 1042       Lab Results  Component Value Date   WBC 6.1 10/11/2016   HGB 14.2 10/11/2016   HCT 42.1 10/11/2016   MCV 99.5 10/11/2016   PLT 161 10/11/2016   NEUTROABS 3.9  10/11/2016    ASSESSMENT & PLAN:  Follicular 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 Retroperitoneal mass biopsy left side 90/30/0923: Follicular lymphoma low-grade grade 1-2, flow cytometry positive for CD10, 20, 3, 10, 5, BCL-2, BCl-6, CD21, Ki-67 less than 10%, stage IB (8X 4 cm mass)  Treatment plan:systemic chemotherapy with bendamustine and Rituxan 6 cycles ( From 08/13/2014 to 01/01/2015) followed by Rituxan maintenance every 2 months for 2 years  PET/CT scan 01/16/2015: Interval decrease in size and metabolic activity of the large left retroperitoneal mass , no increase in activity, size 10 mm decreased from 48 mm SUV 1.9 , no additional hypermetabolic activity I discussed with her that in the future scans will be done on an as-needed basis. There is no role of routine CT scans for surveillance for follicular lymphoma. ------------------------------------------------------------------------------------------------------------------------------------------ Plan: 1. Continue maintenance therapy with Rituxan every 2 months for 2 years Started02/02/2015. 2. Return to clinic with Rituxan maintenance in 2 months she will complete this November 2018 Monitoring closely for toxicities. I  sent a message to Dr. Donne Hazel to remove her port after November 13  Breast cancer of upper-outer quadrant of right female breast [facility]) Right breast cancer: Right mastectomy 04/14/2011: 2 foci of invasive ductal carcinoma 0.2 cm, grade 1 with background extensive DCIS, 0/5 lymph nodes negative,focus 1: ER 94%, PR 100%, Ki-67 5%, HER-2 negative: Focus to ER 100% PR 6% Ki-67 29% HER-2 negative Femara 2.5 mg daily from 2013- 2018. Because her primary tumor was very small and low risk, we did not recommendextended adjuvant therapy.  Breast Cancer Surveillance: 1. Breast exam 02/24/16: Right mastectomy 2. Mammogram 06/06/2018No abnormalities. Postsurgical changes. Breast Density Category  B.  Return to clinic in 2 months for her last treatment of Rituxan I spent 25 minutes talking to the patient of which more than half was spent in counseling and coordination of care.  No orders of the defined types were placed in this encounter.  The patient has a good understanding of the overall plan. she agrees with it. she will call with any problems that may develop before the next visit here.   Rulon Eisenmenger, MD 10/11/16

## 2016-10-11 NOTE — Assessment & Plan Note (Signed)
Right breast cancer: Right mastectomy 04/14/2011: 2 foci of invasive ductal carcinoma 0.2 cm, grade 1 with background extensive DCIS, 0/5 lymph nodes negative,focus 1: ER 94%, PR 100%, Ki-67 5%, HER-2 negative: Focus to ER 100% PR 6% Ki-67 29% HER-2 negative Femara 2.5 mg daily from 2013- 2018. Because her primary tumor was very small and low risk, we did not recommendextended adjuvant therapy.  Breast Cancer Surveillance: 1. Breast exam 02/24/16: Right mastectomy 2. Mammogram 06/06/2018No abnormalities. Postsurgical changes. Breast Density Category B.

## 2016-10-11 NOTE — Telephone Encounter (Signed)
Gave patient avs and calendar with appts.  °

## 2016-10-11 NOTE — Assessment & Plan Note (Signed)
Retroperitoneal mass biopsy left side 02/54/8628: Follicular lymphoma low-grade grade 1-2, flow cytometry positive for CD10, 20, 3, 10, 5, BCL-2, BCl-6, CD21, Ki-67 less than 10%, stage IB (8X 4 cm mass)  Treatment plan:systemic chemotherapy with bendamustine and Rituxan 6 cycles ( From 08/13/2014 to 01/01/2015) followed by Rituxan maintenance every 2 months for 2 years  PET/CT scan 01/16/2015: Interval decrease in size and metabolic activity of the large left retroperitoneal mass , no increase in activity, size 10 mm decreased from 48 mm SUV 1.9 , no additional hypermetabolic activity I discussed with her that in the future scans will be done on an as-needed basis. There is no role of routine CT scans for surveillance for follicular lymphoma. ------------------------------------------------------------------------------------------------------------------------------------------ Plan: 1. Continue maintenance therapy with Rituxan every 2 months for 2 years Started02/02/2015. 2. Return to clinic with Rituxan maintenance in 2 months she will complete this November 2018 Monitoring closely for toxicities.

## 2016-10-11 NOTE — Patient Instructions (Signed)
[facility] Discharge Instructions for Patients Receiving Chemotherapy  Today you received the following chemotherapy agents Rituxan To help prevent nausea and vomiting after your treatment, we encourage you to take your nausea medication as prescribed.  If you develop nausea and vomiting that is not controlled by your nausea medication, call the clinic.   BELOW ARE SYMPTOMS THAT SHOULD BE REPORTED IMMEDIATELY:  *FEVER GREATER THAN 100.5 F  *CHILLS WITH OR WITHOUT FEVER  NAUSEA AND VOMITING THAT IS NOT CONTROLLED WITH YOUR NAUSEA MEDICATION  *UNUSUAL SHORTNESS OF BREATH  *UNUSUAL BRUISING OR BLEEDING  TENDERNESS IN MOUTH AND THROAT WITH OR WITHOUT PRESENCE OF ULCERS  *URINARY PROBLEMS  *BOWEL PROBLEMS  UNUSUAL RASH Items with * indicate a potential emergency and should be followed up as soon as possible.  Feel free to call the clinic you have any questions or concerns. The clinic phone number is [phone].  Please show the CHEMO ALERT CARD at check-in to the Emergency Department and triage nurse.   

## 2016-10-11 NOTE — Telephone Encounter (Signed)
There was no 9/18 los at checkout

## 2016-10-24 ENCOUNTER — Other Ambulatory Visit: Payer: Self-pay | Admitting: General Surgery

## 2016-11-03 DIAGNOSIS — M65871 Other synovitis and tenosynovitis, right ankle and foot: Secondary | ICD-10-CM | POA: Diagnosis not present

## 2016-11-03 DIAGNOSIS — M19071 Primary osteoarthritis, right ankle and foot: Secondary | ICD-10-CM | POA: Diagnosis not present

## 2016-11-03 DIAGNOSIS — M12271 Villonodular synovitis (pigmented), right ankle and foot: Secondary | ICD-10-CM | POA: Diagnosis not present

## 2016-11-03 DIAGNOSIS — M722 Plantar fascial fibromatosis: Secondary | ICD-10-CM | POA: Diagnosis not present

## 2016-11-03 DIAGNOSIS — M25571 Pain in right ankle and joints of right foot: Secondary | ICD-10-CM | POA: Diagnosis not present

## 2016-11-17 DIAGNOSIS — M25571 Pain in right ankle and joints of right foot: Secondary | ICD-10-CM | POA: Diagnosis not present

## 2016-11-17 DIAGNOSIS — M65871 Other synovitis and tenosynovitis, right ankle and foot: Secondary | ICD-10-CM | POA: Diagnosis not present

## 2016-11-17 DIAGNOSIS — D485 Neoplasm of uncertain behavior of skin: Secondary | ICD-10-CM | POA: Diagnosis not present

## 2016-11-22 DIAGNOSIS — D225 Melanocytic nevi of trunk: Secondary | ICD-10-CM | POA: Diagnosis not present

## 2016-11-22 DIAGNOSIS — C44519 Basal cell carcinoma of skin of other part of trunk: Secondary | ICD-10-CM | POA: Diagnosis not present

## 2016-11-22 DIAGNOSIS — L821 Other seborrheic keratosis: Secondary | ICD-10-CM | POA: Diagnosis not present

## 2016-11-30 NOTE — Pre-Procedure Instructions (Addendum)
Selena [name]  11/30/2016      [facility], [address] [place] [address] Phone: [phone] Fax: [phone]    Your procedure is scheduled on November 14  Report to [place] at [place].M.  Call this number if you have problems the morning of surgery:  [phone]   Remember:  Do not eat food or drink liquids after midnight.  Continue all other medications as directed by your physician except follow these medication instructions before surgery    Take these medicines the morning of surgery with A SIP OF WATER cetirizine (ZYRTEC) meclizine (ANTIVERT)  Please complete your 8oz of Boost Breeze or Water that was given to you at your preadmission appointment by 0430am on the [place]    7 days prior to surgery STOP taking any Aspirin (unless otherwise instructed by your surgeon), Aleve, Naproxen, Ibuprofen, Motrin, Advil, Goody's, BC's, all herbal medications, fish oil, and all vitamins diclofenac (VOLTAREN)    Do not wear jewelry, make-up or nail polish.  Do not wear lotions, powders, or perfumes, or deoderant.  Do not shave 48 hours prior to surgery.    Do not bring valuables to the hospital.  [facility] is not responsible for any belongings or valuables.  Contacts, dentures or bridgework may not be worn into surgery.  Leave your suitcase in the car.  After surgery it may be brought to your room.  For patients admitted to the hospital, discharge time will be determined by your treatment team.  Patients discharged the day of surgery will not be allowed to drive home.    Special instructions:   [place]- Preparing For Surgery  Before surgery, you can play an important role. Because skin is not sterile, your skin needs to be as free of germs as possible. You can reduce the number of germs on your skin by washing with CHG (chlorahexidine  gluconate) Soap before surgery.  CHG is an antiseptic cleaner which kills germs and bonds with the skin to continue killing germs even after washing.  Please do not use if you have an allergy to CHG or antibacterial soaps. If your skin becomes reddened/irritated stop using the CHG.  Do not shave (including legs and underarms) for at least 48 hours prior to first CHG shower. It is OK to shave your face.  Please follow these instructions carefully.   1. Shower the NIGHT BEFORE SURGERY and the MORNING OF SURGERY with CHG.   2. If you chose to wash your hair, wash your hair first as usual with your normal shampoo.  3. After you shampoo, rinse your hair and body thoroughly to remove the shampoo.  4. Use CHG as you would any other liquid soap. You can apply CHG directly to the skin and wash gently with a scrungie or a clean washcloth.   5. Apply the CHG Soap to your body ONLY FROM THE NECK DOWN.  Do not use on open wounds or open sores. Avoid contact with your eyes, ears, mouth and genitals (private parts). Wash Face and genitals (private parts)  with your normal soap.  6. Wash thoroughly, paying special attention to the area where your surgery will be performed.  7. Thoroughly rinse your body with warm water from the neck down.  8. DO NOT shower/wash with your normal soap after using and rinsing off  the CHG Soap.  9. Pat yourself dry with a CLEAN TOWEL.  10. Wear CLEAN PAJAMAS to bed the night before surgery, wear comfortable clothes the morning of surgery  11. Place CLEAN SHEETS on your bed the night of your first shower and DO NOT SLEEP WITH PETS.    Day of Surgery: Do not apply any deodorants/lotions. Please wear clean clothes to the hospital/surgery center.      Please read over the following fact sheets that you were given.

## 2016-12-01 ENCOUNTER — Encounter (HOSPITAL_COMMUNITY)
Admission: RE | Admit: 2016-12-01 | Discharge: 2016-12-01 | Disposition: A | Discharge status: Home / Self Care | Payer: Medicare Other | Source: Ambulatory Visit | Attending: General Surgery | Admitting: General Surgery

## 2016-12-01 ENCOUNTER — Other Ambulatory Visit: Payer: Self-pay

## 2016-12-01 ENCOUNTER — Encounter (HOSPITAL_COMMUNITY): Payer: Self-pay

## 2016-12-01 DIAGNOSIS — Z01812 Encounter for preprocedural laboratory examination: Secondary | ICD-10-CM | POA: Insufficient documentation

## 2016-12-01 DIAGNOSIS — I10 Essential (primary) hypertension: Secondary | ICD-10-CM | POA: Diagnosis not present

## 2016-12-01 DIAGNOSIS — Z01810 Encounter for preprocedural cardiovascular examination: Secondary | ICD-10-CM | POA: Insufficient documentation

## 2016-12-01 LAB — COMPREHENSIVE METABOLIC PANEL
ALBUMIN: 3.8 g/dL (ref 3.5–5.0)
ALT: 59 U/L — ABNORMAL HIGH (ref 14–54)
ANION GAP: 8 (ref 5–15)
AST: 46 U/L — ABNORMAL HIGH (ref 15–41)
Alkaline Phosphatase: 103 U/L (ref 38–126)
BILIRUBIN TOTAL: 0.8 mg/dL (ref 0.3–1.2)
BUN: 20 mg/dL (ref 6–20)
CHLORIDE: 107 mmol/L (ref 101–111)
CO2: 23 mmol/L (ref 22–32)
Calcium: 9.4 mg/dL (ref 8.9–10.3)
Creatinine, Ser: 0.68 mg/dL (ref 0.44–1.00)
GFR calc Af Amer: >60 mL/min (ref >60)
GFR calc non Af Amer: >60 mL/min (ref >60)
GLUCOSE: 99 mg/dL (ref 65–99)
POTASSIUM: 4.3 mmol/L (ref 3.5–5.1)
SODIUM: 138 mmol/L (ref 135–145)
TOTAL PROTEIN: 6.2 g/dL — AB (ref 6.5–8.1)

## 2016-12-01 LAB — CBC
HCT: 44.5 % (ref 36.0–46.0)
Hemoglobin: 15.1 g/dL — ABNORMAL HIGH (ref 12.0–15.0)
MCH: 33.6 pg (ref 26.0–34.0)
MCHC: 33.9 g/dL (ref 30.0–36.0)
MCV: 99.1 fL (ref 78.0–100.0)
PLATELETS: 162 10*3/uL (ref 150–400)
RBC: 4.49 MIL/uL (ref 3.87–5.11)
RDW: 12.9 % (ref 11.5–15.5)
WBC: 6.7 10*3/uL (ref 4.0–10.5)

## 2016-12-01 NOTE — Progress Notes (Signed)
PCP - Bluford Kaufmann Cardiologist - denies  Chest x-ray - not needed EKG - 12/01/16 Stress Test - 2009 ECHO - 2011 Cardiac Cathdenies -     Patient denies shortness of breath, fever, cough and chest pain at PAT appointment   Patient verbalized understanding of instructions that were given to them at the PAT appointment. Patient was also instructed that they will need to review over the PAT instructions again at home before surgery.

## 2016-12-05 ENCOUNTER — Other Ambulatory Visit: Payer: Self-pay

## 2016-12-05 DIAGNOSIS — Z170 Estrogen receptor positive status [ER+]: Principal | ICD-10-CM

## 2016-12-05 DIAGNOSIS — C50411 Malignant neoplasm of upper-outer quadrant of right female breast: Secondary | ICD-10-CM

## 2016-12-06 ENCOUNTER — Ambulatory Visit (HOSPITAL_BASED_OUTPATIENT_CLINIC_OR_DEPARTMENT_OTHER): Payer: Medicare Other

## 2016-12-06 ENCOUNTER — Other Ambulatory Visit (HOSPITAL_BASED_OUTPATIENT_CLINIC_OR_DEPARTMENT_OTHER): Payer: Medicare Other

## 2016-12-06 ENCOUNTER — Ambulatory Visit: Payer: Medicare Other | Admitting: Hematology and Oncology

## 2016-12-06 ENCOUNTER — Telehealth: Payer: Self-pay | Admitting: Hematology and Oncology

## 2016-12-06 VITALS — BP 179/69 | HR 71 | Temp 98.1°F | Resp 18 | Ht 67.0 in | Wt 208.3 lb

## 2016-12-06 VITALS — BP 186/75 | HR 63 | Temp 98.2°F | Resp 17

## 2016-12-06 DIAGNOSIS — Z5112 Encounter for antineoplastic immunotherapy: Secondary | ICD-10-CM

## 2016-12-06 DIAGNOSIS — C50411 Malignant neoplasm of upper-outer quadrant of right female breast: Secondary | ICD-10-CM

## 2016-12-06 DIAGNOSIS — Z170 Estrogen receptor positive status [ER+]: Secondary | ICD-10-CM | POA: Diagnosis not present

## 2016-12-06 DIAGNOSIS — C8203 Follicular lymphoma grade I, intra-abdominal lymph nodes: Secondary | ICD-10-CM

## 2016-12-06 LAB — COMPREHENSIVE METABOLIC PANEL
ALT: 57 U/L — AB (ref 0–55)
ANION GAP: 8 meq/L (ref 3–11)
AST: 41 U/L — ABNORMAL HIGH (ref 5–34)
Albumin: 3.7 g/dL (ref 3.5–5.0)
Alkaline Phosphatase: 110 U/L (ref 40–150)
BUN: 18.5 mg/dL (ref 7.0–26.0)
CALCIUM: 9.5 mg/dL (ref 8.4–10.4)
CHLORIDE: 108 meq/L (ref 98–109)
CO2: 27 meq/L (ref 22–29)
Creatinine: 0.8 mg/dL (ref 0.6–1.1)
Glucose: 96 mg/dl (ref 70–140)
POTASSIUM: 4.2 meq/L (ref 3.5–5.1)
Sodium: 143 mEq/L (ref 136–145)
Total Bilirubin: 0.56 mg/dL (ref 0.20–1.20)
Total Protein: 6.5 g/dL (ref 6.4–8.3)

## 2016-12-06 LAB — CBC WITH DIFFERENTIAL/PLATELET
BASO%: 1 % (ref 0.0–2.0)
BASOS ABS: 0.1 10*3/uL (ref 0.0–0.1)
EOS ABS: 0.3 10*3/uL (ref 0.0–0.5)
EOS%: 4 % (ref 0.0–7.0)
HEMATOCRIT: 43.4 % (ref 34.8–46.6)
HEMOGLOBIN: 14.5 g/dL (ref 11.6–15.9)
LYMPH#: 0.8 10*3/uL — AB (ref 0.9–3.3)
LYMPH%: 13.1 % — ABNORMAL LOW (ref 14.0–49.7)
MCH: 33.4 pg (ref 25.1–34.0)
MCHC: 33.4 g/dL (ref 31.5–36.0)
MCV: 100 fL (ref 79.5–101.0)
MONO#: 0.8 10*3/uL (ref 0.1–0.9)
MONO%: 13.4 % (ref 0.0–14.0)
NEUT%: 68.5 % (ref 38.4–76.8)
NEUTROS ABS: 4.3 10*3/uL (ref 1.5–6.5)
Platelets: 148 10*3/uL (ref 145–400)
RBC: 4.34 10*6/uL (ref 3.70–5.45)
RDW: 12.7 % (ref 11.2–14.5)
WBC: 6.2 10*3/uL (ref 3.9–10.3)

## 2016-12-06 MED ORDER — DIPHENHYDRAMINE HCL 25 MG PO CAPS
ORAL_CAPSULE | ORAL | Status: AC
  Filled 2016-12-06: qty 2

## 2016-12-06 MED ORDER — SODIUM CHLORIDE 0.9 % IV SOLN
Freq: Once | INTRAVENOUS | Status: AC
  Administered 2016-12-06: 12:00:00 via INTRAVENOUS

## 2016-12-06 MED ORDER — SODIUM CHLORIDE 0.9 % IJ SOLN
10.0000 mL | INTRAMUSCULAR | Status: DC | PRN
  Administered 2016-12-06: 10 mL
  Filled 2016-12-06: qty 10

## 2016-12-06 MED ORDER — ACETAMINOPHEN 325 MG PO TABS
ORAL_TABLET | ORAL | Status: AC
  Filled 2016-12-06: qty 2

## 2016-12-06 MED ORDER — ACETAMINOPHEN 325 MG PO TABS
650.0000 mg | ORAL_TABLET | Freq: Once | ORAL | Status: AC
  Administered 2016-12-06: 650 mg via ORAL

## 2016-12-06 MED ORDER — HEPARIN SOD (PORK) LOCK FLUSH 100 UNIT/ML IV SOLN
500.0000 [IU] | Freq: Once | INTRAVENOUS | Status: AC | PRN
  Administered 2016-12-06: 500 [IU]
  Filled 2016-12-06: qty 5

## 2016-12-06 MED ORDER — SODIUM CHLORIDE 0.9 % IV SOLN
375.0000 mg/m2 | Freq: Once | INTRAVENOUS | Status: AC
  Administered 2016-12-06: 800 mg via INTRAVENOUS
  Filled 2016-12-06: qty 30

## 2016-12-06 MED ORDER — DIPHENHYDRAMINE HCL 25 MG PO CAPS
50.0000 mg | ORAL_CAPSULE | Freq: Once | ORAL | Status: AC
  Administered 2016-12-06: 50 mg via ORAL

## 2016-12-06 NOTE — Progress Notes (Signed)
Patient Care Team: Marletta Lor, MD as PCP - General  DIAGNOSIS:  Encounter Diagnoses  Name Primary?  . Follicular 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 ([place]) Yes  . Malignant neoplasm of upper-outer quadrant of right breast in female, estrogen receptor positive ([place])     SUMMARY OF ONCOLOGIC HISTORY:   Breast cancer of upper-outer quadrant of right female breast ([place])   04/14/2011 Surgery    Right mastectomy: 2 foci of invasive ductal carcinoma 0.2 cm, grade 1 with background extensive DCIS, 0/5 lymph nodes negative,focus 1: ER 94%, PR 100%, Ki-67 5%, HER-2 negative: Focus to ER 100%BR 6% Ki-67 29% HER-2 negative      05/02/2011 -  Anti-estrogen oral therapy    letrozole 2.5 mg daily       Follicular 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 ([name])   07/24/2014 Initial Diagnosis    Retroperitoneal mass biopsy left side: Follicular lymphoma low-grade grade 1-2, flow cytometry positive for CD10, 20, 3, 10, 5, BCL-2, BCl-6, CD21, Ki-67 less than 10%      08/13/2014 -  Chemotherapy    Bendamustine and Rituxan day 1, 2 every 4 weeks 6 cycles followed by Rituxan maintenance every 2 months 2 years      11/03/2014 Imaging    midpoint of chemotherapy: Moderate improvement in the periaortic lymphadenopathy without complete resolution      01/16/2015 PET scan     interval decrease in size and metabolic activity of the large left retroperitoneal mass , no increase in activity, size 10 mm decreased from 48 mm  SUV 1.9 , no additional hypermetabolic activity       CHIEF COMPLIANT: Last the treatment of rituximab  INTERVAL HISTORY: Selena [name] is a 81 year old with above-mentioned history of follicular lymphoma who is here to receive her last and final treatment of rituximab maintenance therapy.  After review of the treatment she has had some fatigue and weakness but otherwise recovered very well from it.  She has scheduled an appointment to have her port  removed tomorrow.  REVIEW OF SYSTEMS:   Constitutional: Denies fevers, chills or abnormal weight loss Eyes: Denies blurriness of vision Ears, nose, mouth, throat, and face: Denies mucositis or sore throat Respiratory: Denies cough, dyspnea or wheezes Cardiovascular: Denies palpitation, chest discomfort Gastrointestinal:  Denies nausea, heartburn or change in bowel habits Skin: Denies abnormal skin rashes Lymphatics: Denies new lymphadenopathy or easy bruising Neurological:Denies numbness, tingling or new weaknesses Behavioral/Psych: Mood is stable, no new changes  Extremities: No lower extremity edema Breast:  denies any pain or lumps or nodules in either breasts All other systems were reviewed with the patient and are negative.  I have reviewed the past medical history, past surgical history, social history and family history with the patient and they are unchanged from previous note.  ALLERGIES:  is allergic to codeine and codeine phosphate.  MEDICATIONS:  Current Outpatient Medications  Medication Sig Dispense Refill  . cetirizine (ZYRTEC) 10 MG tablet Take 10 mg by mouth daily.    . diclofenac (VOLTAREN) 75 MG EC tablet TAKE 1 TABLET BY MOUTH TWICE DAILY (Patient taking differently: Take 75 mg by mouth 2 (two) times daily. TAKE 1 TABLET BY MOUTH TWICE DAILY) 180 tablet 3  . diphenhydrAMINE (BENADRYL) 25 mg capsule Take 50 mg by mouth at bedtime as needed for sleep (takes with Ibuprofen for sleep).    [name] ibuprofen (ADVIL,MOTRIN) 200 MG tablet Take 400 mg by mouth at bedtime as needed (SLEEP).  Takes with the Benadryl for sleep    . lidocaine-prilocaine (EMLA) cream APPLY PRIOR TO PORT AXCESS  3  . meclizine (ANTIVERT) 25 MG tablet Take 1 tablet (25 mg total) by mouth every 6 (six) hours as needed for dizziness. 60 tablet 0  . ondansetron (ZOFRAN) 4 MG tablet Take 1 tablet (4 mg total) by mouth every 8 (eight) hours as needed for nausea or vomiting. 20 tablet 0  . UNABLE TO FIND Rx:  L8000- Post Surgical Bras (Quantity: 6) Q6834- Silicone Breast Prosthesis (Quantity: 1) Dx: 174.9; Right Mastectomy 1 each 0   No current facility-administered medications for this visit.    Facility-Administered Medications Ordered in Other Visits  Medication Dose Route Frequency Provider Last Rate Last Dose  . heparin lock flush 100 unit/mL  500 Units Intracatheter Once PRN Nicholas Lose, MD      . riTUXimab (RITUXAN) 800 mg in sodium chloride 0.9 % 250 mL (2.4242 mg/mL) chemo infusion  375 mg/m2 (Treatment Plan Recorded) Intravenous Once Nicholas Lose, MD      . sodium chloride 0.9 % injection 10 mL  10 mL Intracatheter PRN Nicholas Lose, MD        PHYSICAL EXAMINATION: ECOG PERFORMANCE STATUS: 1 - Symptomatic but completely ambulatory  Vitals:   12/06/16 1109  BP: (!) 179/69  Pulse: 71  Resp: 18  Temp: 98.1 F (36.7 C)  SpO2: 99%   Filed Weights   12/06/16 1109  Weight: 208 lb 4.8 oz (94.5 kg)    GENERAL:alert, no distress and comfortable SKIN: skin color, texture, turgor are normal, no rashes or significant lesions EYES: normal, Conjunctiva are pink and non-injected, sclera clear OROPHARYNX:no exudate, no erythema and lips, buccal mucosa, and tongue normal  NECK: supple, thyroid normal size, non-tender, without nodularity LYMPH:  no palpable lymphadenopathy in the cervical, axillary or inguinal LUNGS: clear to auscultation and percussion with normal breathing effort HEART: regular rate & rhythm and no murmurs and no lower extremity edema ABDOMEN:abdomen soft, non-tender and normal bowel sounds MUSCULOSKELETAL:no cyanosis of digits and no clubbing  NEURO: alert & oriented x 3 with fluent speech, no focal motor/sensory deficits EXTREMITIES: No lower extremity edema  LABORATORY DATA:  I have reviewed the data as listed   Chemistry      Component Value Date/Time   NA 143 12/06/2016 1057   K 4.2 12/06/2016 1057   CL 107 12/01/2016 1029   CL 107 04/05/2012 1253    CO2 27 12/06/2016 1057   BUN 18.5 12/06/2016 1057   CREATININE 0.8 12/06/2016 1057      Component Value Date/Time   CALCIUM 9.5 12/06/2016 1057   ALKPHOS 110 12/06/2016 1057   AST 41 (H) 12/06/2016 1057   ALT 57 (H) 12/06/2016 1057   BILITOT 0.56 12/06/2016 1057       Lab Results  Component Value Date   WBC 6.2 12/06/2016   HGB 14.5 12/06/2016   HCT 43.4 12/06/2016   MCV 100.0 12/06/2016   PLT 148 12/06/2016   NEUTROABS 4.3 12/06/2016    ASSESSMENT & PLAN:  Follicular 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 Retroperitoneal mass biopsy left side 19/62/2297: Follicular lymphoma low-grade grade 1-2, flow cytometry positive for CD10, 20, 3, 10, 5, BCL-2, BCl-6, CD21, Ki-67 less than 10%, stage IB (8X 4 cm mass)  Treatment plan:systemic chemotherapy with bendamustine and Rituxan 6 cycles ( From 08/13/2014 to 01/01/2015) followed by Rituxan maintenance every 2 months for 2 years  PET/CT scan 01/16/2015: Interval decrease in size  and metabolic activity of the large left retroperitoneal mass , no increase in activity, size 10 mm decreased from 48 mm SUV 1.9 , no additional hypermetabolic activity I discussed with her that in the future scans will be done on an as-needed basis. There is no role of routine CT scans for surveillance for follicular lymphoma.    Breast cancer of upper-outer quadrant of right female breast [facility]) Right breast cancer: Right mastectomy 04/14/2011: 2 foci of invasive ductal carcinoma 0.2 cm, grade 1 with background extensive DCIS, 0/5 lymph nodes negative,focus 1: ER 94%, PR 100%, Ki-67 5%, HER-2 negative: Focus to ER 100% PR 6% Ki-67 29% HER-2 negative Femara 2.5 mg daily from2013- 2018. Because her primary tumor was very small and low risk, we did not recommendextended adjuvant therapy.  Breast Cancer Surveillance: 1. Breast exam 02/24/16: Right mastectomy 2. Mammogram 06/06/2018No abnormalities. Postsurgical changes. Breast Density  Category B.  Return to clinic in 6 months labs and follow up   I spent 25 minutes talking to the patient of which more than half was spent in counseling and coordination of care.  Orders Placed This Encounter  Procedures  . CBC with Differential    Standing Status:   Future    Standing Expiration Date:   12/06/2017  . Comprehensive metabolic panel    Standing Status:   Future    Standing Expiration Date:   12/06/2017  . Lactate dehydrogenase (LDH)    Standing Status:   Future    Standing Expiration Date:   12/06/2017   The patient has a good understanding of the overall plan. she agrees with it. she will call with any problems that may develop before the next visit here.   Rulon Eisenmenger, MD 12/06/16

## 2016-12-06 NOTE — Assessment & Plan Note (Signed)
Retroperitoneal mass biopsy left side 16/86/1042: Follicular lymphoma low-grade grade 1-2, flow cytometry positive for CD10, 20, 3, 10, 5, BCL-2, BCl-6, CD21, Ki-67 less than 10%, stage IB (8X 4 cm mass)  Treatment plan:systemic chemotherapy with bendamustine and Rituxan 6 cycles ( From 08/13/2014 to 01/01/2015) followed by Rituxan maintenance every 2 months for 2 years  PET/CT scan 01/16/2015: Interval decrease in size and metabolic activity of the large left retroperitoneal mass , no increase in activity, size 10 mm decreased from 48 mm SUV 1.9 , no additional hypermetabolic activity I discussed with her that in the future scans will be done on an as-needed basis. There is no role of routine CT scans for surveillance for follicular lymphoma.

## 2016-12-06 NOTE — Telephone Encounter (Signed)
Gave patient avs and calendar with appts per 11/13 los.

## 2016-12-06 NOTE — Patient Instructions (Signed)
[facility] Discharge Instructions for Patients Receiving Chemotherapy  Today you received the following chemotherapy agents:  Rituxan (rituximab)  To help prevent nausea and vomiting after your treatment, we encourage you to take your nausea medication as prescribed.   If you develop nausea and vomiting that is not controlled by your nausea medication, call the clinic.   BELOW ARE SYMPTOMS THAT SHOULD BE REPORTED IMMEDIATELY:  *FEVER GREATER THAN 100.5 F  *CHILLS WITH OR WITHOUT FEVER  NAUSEA AND VOMITING THAT IS NOT CONTROLLED WITH YOUR NAUSEA MEDICATION  *UNUSUAL SHORTNESS OF BREATH  *UNUSUAL BRUISING OR BLEEDING  TENDERNESS IN MOUTH AND THROAT WITH OR WITHOUT PRESENCE OF ULCERS  *URINARY PROBLEMS  *BOWEL PROBLEMS  UNUSUAL RASH Items with * indicate a potential emergency and should be followed up as soon as possible.  Feel free to call the clinic should you have any questions or concerns. The clinic phone number is [phone].  Please show the [facility] at check-in to the Emergency Department and triage nurse.

## 2016-12-06 NOTE — Anesthesia Preprocedure Evaluation (Addendum)
Anesthesia Evaluation  Patient identified by MRN, date of birth, ID band Patient awake    Reviewed: Allergy & Precautions, H&P , NPO status , Patient's Chart, lab work & pertinent test results  History of Anesthesia Complications (+) PONV  Airway Mallampati: II  TM Distance: >3 FB Neck ROM: Full    Dental no notable dental hx. (+) Teeth Intact, Dental Advisory Given   Pulmonary neg pulmonary ROS, former smoker,    Pulmonary exam normal breath sounds clear to auscultation       Cardiovascular Exercise Tolerance: Good negative cardio ROS   Rhythm:Regular Rate:Normal     Neuro/Psych Anxiety negative neurological ROS  negative psych ROS   GI/Hepatic negative GI ROS, Neg liver ROS,   Endo/Other  negative endocrine ROS  Renal/GU negative Renal ROS  negative genitourinary   Musculoskeletal  (+) Arthritis , Osteoarthritis,    Abdominal   Peds  Hematology negative hematology ROS (+) anemia ,   Anesthesia Other Findings   Reproductive/Obstetrics negative OB ROS                            Anesthesia Physical Anesthesia Plan  ASA: II  Anesthesia Plan: MAC   Post-op Pain Management:    Induction: Intravenous  PONV Risk Score and Plan: 3 and Ondansetron, Dexamethasone and Propofol infusion  Airway Management Planned: Simple Face Mask  Additional Equipment:   Intra-op Plan:   Post-operative Plan:   Informed Consent: I have reviewed the patients History and Physical, chart, labs and discussed the procedure including the risks, benefits and alternatives for the proposed anesthesia with the patient or authorized representative who has indicated his/her understanding and acceptance.   Dental advisory given  Plan Discussed with: CRNA  Anesthesia Plan Comments:         Anesthesia Quick Evaluation

## 2016-12-06 NOTE — Progress Notes (Signed)
Pt c/o burning in port while NS infusioning at 6ml/hr.  Blood return checked and easily obtained.  Burning sensation returned when port flushed with NS after checking blood return.  Port de-accessed and PIV started in left hand (unable to use rt arm d/t lymph nodes removed in 2013).  Today is pt's last treatment and pt is already scheduled to have port removed tomorrow.

## 2016-12-06 NOTE — Assessment & Plan Note (Addendum)
Right breast cancer: Right mastectomy 04/14/2011: 2 foci of invasive ductal carcinoma 0.2 cm, grade 1 with background extensive DCIS, 0/5 lymph nodes negative,focus 1: ER 94%, PR 100%, Ki-67 5%, HER-2 negative: Focus to ER 100% PR 6% Ki-67 29% HER-2 negative Femara 2.5 mg daily from2013- 2018. Because her primary tumor was very small and low risk, we did not recommendextended adjuvant therapy.  Breast Cancer Surveillance: 1. Breast exam 02/24/16: Right mastectomy 2. Mammogram 06/06/2018No abnormalities. Postsurgical changes. Breast Density Category B.  Return to clinic in 6 months labs and follow up

## 2016-12-07 ENCOUNTER — Encounter (HOSPITAL_COMMUNITY)
Admission: RE | Disposition: A | Discharge status: Home / Self Care | Payer: Self-pay | Source: Ambulatory Visit | Attending: General Surgery

## 2016-12-07 ENCOUNTER — Ambulatory Visit (HOSPITAL_COMMUNITY): Payer: Medicare Other | Admitting: Anesthesiology

## 2016-12-07 ENCOUNTER — Encounter (HOSPITAL_COMMUNITY): Payer: Self-pay | Admitting: *Deleted

## 2016-12-07 ENCOUNTER — Other Ambulatory Visit: Payer: Self-pay

## 2016-12-07 ENCOUNTER — Ambulatory Visit (HOSPITAL_COMMUNITY)
Admission: RE | Admit: 2016-12-07 | Discharge: 2016-12-07 | Disposition: A | Discharge status: Home / Self Care | Payer: Medicare Other | Source: Ambulatory Visit | Attending: General Surgery | Admitting: General Surgery

## 2016-12-07 DIAGNOSIS — Z8051 Family history of malignant neoplasm of kidney: Secondary | ICD-10-CM | POA: Insufficient documentation

## 2016-12-07 DIAGNOSIS — Z8042 Family history of malignant neoplasm of prostate: Secondary | ICD-10-CM | POA: Diagnosis not present

## 2016-12-07 DIAGNOSIS — Z96653 Presence of artificial knee joint, bilateral: Secondary | ICD-10-CM | POA: Insufficient documentation

## 2016-12-07 DIAGNOSIS — Z9049 Acquired absence of other specified parts of digestive tract: Secondary | ICD-10-CM | POA: Insufficient documentation

## 2016-12-07 DIAGNOSIS — Z853 Personal history of malignant neoplasm of breast: Secondary | ICD-10-CM | POA: Diagnosis not present

## 2016-12-07 DIAGNOSIS — M1990 Unspecified osteoarthritis, unspecified site: Secondary | ICD-10-CM | POA: Diagnosis not present

## 2016-12-07 DIAGNOSIS — Z807 Family history of other malignant neoplasms of lymphoid, hematopoietic and related tissues: Secondary | ICD-10-CM | POA: Diagnosis not present

## 2016-12-07 DIAGNOSIS — Z9889 Other specified postprocedural states: Secondary | ICD-10-CM | POA: Insufficient documentation

## 2016-12-07 DIAGNOSIS — Z9841 Cataract extraction status, right eye: Secondary | ICD-10-CM | POA: Diagnosis not present

## 2016-12-07 DIAGNOSIS — K5790 Diverticulosis of intestine, part unspecified, without perforation or abscess without bleeding: Secondary | ICD-10-CM | POA: Diagnosis not present

## 2016-12-07 DIAGNOSIS — Z87891 Personal history of nicotine dependence: Secondary | ICD-10-CM | POA: Insufficient documentation

## 2016-12-07 DIAGNOSIS — Z885 Allergy status to narcotic agent status: Secondary | ICD-10-CM | POA: Diagnosis not present

## 2016-12-07 DIAGNOSIS — Z79899 Other long term (current) drug therapy: Secondary | ICD-10-CM | POA: Diagnosis not present

## 2016-12-07 DIAGNOSIS — Z452 Encounter for adjustment and management of vascular access device: Secondary | ICD-10-CM | POA: Diagnosis not present

## 2016-12-07 DIAGNOSIS — Z961 Presence of intraocular lens: Secondary | ICD-10-CM | POA: Diagnosis not present

## 2016-12-07 DIAGNOSIS — Z9842 Cataract extraction status, left eye: Secondary | ICD-10-CM | POA: Insufficient documentation

## 2016-12-07 DIAGNOSIS — I4891 Unspecified atrial fibrillation: Secondary | ICD-10-CM | POA: Insufficient documentation

## 2016-12-07 DIAGNOSIS — Z90710 Acquired absence of both cervix and uterus: Secondary | ICD-10-CM | POA: Insufficient documentation

## 2016-12-07 DIAGNOSIS — Z800 Family history of malignant neoplasm of digestive organs: Secondary | ICD-10-CM | POA: Insufficient documentation

## 2016-12-07 DIAGNOSIS — Z85828 Personal history of other malignant neoplasm of skin: Secondary | ICD-10-CM | POA: Insufficient documentation

## 2016-12-07 DIAGNOSIS — Z791 Long term (current) use of non-steroidal anti-inflammatories (NSAID): Secondary | ICD-10-CM | POA: Diagnosis not present

## 2016-12-07 DIAGNOSIS — Z8572 Personal history of non-Hodgkin lymphomas: Secondary | ICD-10-CM | POA: Insufficient documentation

## 2016-12-07 DIAGNOSIS — C50411 Malignant neoplasm of upper-outer quadrant of right female breast: Secondary | ICD-10-CM | POA: Diagnosis not present

## 2016-12-07 HISTORY — PX: PORT-A-CATH REMOVAL: SHX5289

## 2016-12-07 SURGERY — REMOVAL PORT-A-CATH
Anesthesia: Monitor Anesthesia Care | Site: Chest

## 2016-12-07 MED ORDER — ONDANSETRON HCL 4 MG/2ML IJ SOLN
INTRAMUSCULAR | Status: AC
  Filled 2016-12-07: qty 2

## 2016-12-07 MED ORDER — FENTANYL CITRATE (PF) 100 MCG/2ML IJ SOLN: 25.0000 ug | INTRAMUSCULAR | Status: DC | PRN

## 2016-12-07 MED ORDER — ONDANSETRON HCL 4 MG/2ML IJ SOLN
INTRAMUSCULAR | Status: DC | PRN
  Administered 2016-12-07: 4 mg via INTRAVENOUS

## 2016-12-07 MED ORDER — PROPOFOL 500 MG/50ML IV EMUL
INTRAVENOUS | Status: DC | PRN
  Administered 2016-12-07: 100 ug/kg/min via INTRAVENOUS

## 2016-12-07 MED ORDER — FENTANYL CITRATE (PF) 100 MCG/2ML IJ SOLN
INTRAMUSCULAR | Status: DC | PRN
  Administered 2016-12-07: 50 ug via INTRAVENOUS

## 2016-12-07 MED ORDER — LACTATED RINGERS IV SOLN
INTRAVENOUS | Status: DC | PRN
  Administered 2016-12-07: 08:00:00 via INTRAVENOUS

## 2016-12-07 MED ORDER — BUPIVACAINE-EPINEPHRINE (PF) 0.25% -1:200000 IJ SOLN
INTRAMUSCULAR | Status: AC
  Filled 2016-12-07: qty 30

## 2016-12-07 MED ORDER — BUPIVACAINE-EPINEPHRINE 0.25% -1:200000 IJ SOLN
INTRAMUSCULAR | Status: DC | PRN
  Administered 2016-12-07: 10 mL

## 2016-12-07 MED ORDER — PROPOFOL 10 MG/ML IV BOLUS
INTRAVENOUS | Status: AC
  Filled 2016-12-07: qty 20

## 2016-12-07 MED ORDER — FENTANYL CITRATE (PF) 250 MCG/5ML IJ SOLN
INTRAMUSCULAR | Status: AC
  Filled 2016-12-07: qty 5

## 2016-12-07 MED ORDER — 0.9 % SODIUM CHLORIDE (POUR BTL) OPTIME
TOPICAL | Status: DC | PRN
  Administered 2016-12-07: 1000 mL

## 2016-12-07 SURGICAL SUPPLY — 30 items
ADH SKN CLS APL DERMABOND .7 (GAUZE/BANDAGES/DRESSINGS) ×1
CHLORAPREP W/TINT 10.5 ML (MISCELLANEOUS) ×3 IMPLANT
COVER SURGICAL LIGHT HANDLE (MISCELLANEOUS) ×3 IMPLANT
DECANTER SPIKE VIAL GLASS SM (MISCELLANEOUS) ×3 IMPLANT
DERMABOND ADVANCED (GAUZE/BANDAGES/DRESSINGS) ×2
DERMABOND ADVANCED .7 DNX12 (GAUZE/BANDAGES/DRESSINGS) ×1 IMPLANT
DRAPE LAPAROTOMY 100X72 PEDS (DRAPES) ×3 IMPLANT
ELECT CAUTERY BLADE 6.4 (BLADE) ×3 IMPLANT
ELECT REM PT RETURN 9FT ADLT (ELECTROSURGICAL) ×3
ELECTRODE REM PT RTRN 9FT ADLT (ELECTROSURGICAL) ×1 IMPLANT
GAUZE SPONGE 4X4 16PLY XRAY LF (GAUZE/BANDAGES/DRESSINGS) ×3 IMPLANT
GLOVE BIO SURGEON STRL SZ7 (GLOVE) ×3 IMPLANT
GLOVE BIOGEL PI IND STRL 7.5 (GLOVE) ×1 IMPLANT
GLOVE BIOGEL PI INDICATOR 7.5 (GLOVE) ×2
GOWN STRL REUS W/ TWL LRG LVL3 (GOWN DISPOSABLE) ×2 IMPLANT
GOWN STRL REUS W/TWL LRG LVL3 (GOWN DISPOSABLE) ×6
KIT BASIN OR (CUSTOM PROCEDURE TRAY) ×3 IMPLANT
KIT ROOM TURNOVER OR (KITS) ×3 IMPLANT
NEEDLE HYPO 25GX1X1/2 BEV (NEEDLE) ×3 IMPLANT
NS IRRIG 1000ML POUR BTL (IV SOLUTION) ×3 IMPLANT
PACK SURGICAL SETUP 50X90 (CUSTOM PROCEDURE TRAY) ×3 IMPLANT
PAD ARMBOARD 7.5X6 YLW CONV (MISCELLANEOUS) ×6 IMPLANT
PENCIL BUTTON HOLSTER BLD 10FT (ELECTRODE) ×3 IMPLANT
SUT MNCRL AB 4-0 PS2 18 (SUTURE) ×3 IMPLANT
SUT VIC AB 3-0 SH 27 (SUTURE) ×3
SUT VIC AB 3-0 SH 27X BRD (SUTURE) ×1 IMPLANT
SYR CONTROL 10ML LL (SYRINGE) ×3 IMPLANT
TOWEL OR 17X24 6PK STRL BLUE (TOWEL DISPOSABLE) ×3 IMPLANT
TOWEL OR 17X26 10 PK STRL BLUE (TOWEL DISPOSABLE) ×3 IMPLANT
WATER STERILE IRR 1000ML POUR (IV SOLUTION) IMPLANT

## 2016-12-07 NOTE — Interval H&P Note (Signed)
History and Physical Interval Note:  12/07/2016 8:14 AM  Selena [name]  has presented today for surgery, with the diagnosis of breast cancer  The various methods of treatment have been discussed with the patient and family. After consideration of risks, benefits and other options for treatment, the patient has consented to  Procedure(s): REMOVAL PORT-A-CATH (N/A) as a surgical intervention .  The patient's history has been reviewed, patient examined, no change in status, stable for surgery.  I have reviewed the patient's chart and labs.  Questions were answered to the patient's satisfaction.     Aleks Nawrot

## 2016-12-07 NOTE — H&P (Signed)
Selena [name] is an 81 y.o. female.   Chief Complaint: no longer needs venous access HPI: 49 yof s/p treatment for breast cancer and lymphoma no longer needs venous access.   Past Medical History:  Diagnosis Date  . Anemia    PMH  . Atrial fibrillation ([place]) 01/01/2007  . BACK PAIN, UPPER 07/09/2008  . Breast cancer, stage 1 ([place])   . Bruises easily   . Cataract    history  . Complication of anesthesia   . Diverticulitis   . DIVERTICULOSIS, COLON 12/26/2006  . Follicular non-Hodgkin's lymphoma ([place])   . Goiter    multi-nodular  . Hx of colonoscopy 2009  . LIVER FUNCTION TESTS, ABNORMAL, HX OF 12/26/2007  . Memory loss 05/16/2007  . OSTEOARTHRITIS 12/26/2006   takes Diclofenac daily but has stopped for surgery  . PONV (postoperative nausea and vomiting)   . Skin cancer   . VERTIGO 09/07/2009  . Vertigo    hx of;had Phenergan prn   . Wears dentures   . Wears glasses   . Wears hearing aid    bilateral    Past Surgical History:  Procedure Laterality Date  . ABDOMINAL HYSTERECTOMY    . ABLATION OF DYSRHYTHMIC FOCUS  2009  . APPENDECTOMY    . AUGMENTATION MAMMAPLASTY Right 2013  . BREAST BIOPSY  2013  . BREAST SURGERY     right sm, snbx  . CATARACT EXTRACTION W/ INTRAOCULAR LENS  IMPLANT, BILATERAL  2010   bilateral  . COLONOSCOPY    . COLONOSCOPY    . KNEE SURGERY  2004/2010   arthroscopic/ bilateral knee replacements  . MASTECTOMY Right 2013  . [place]   right  . tmi  1998  . TONSILLECTOMY      Family History  Problem Relation Age of Onset  . Colon cancer Mother   . Cancer Mother        colon  . Colon cancer Maternal Aunt   . Cancer Maternal Uncle   . Prostate cancer Maternal Uncle   . Prostate cancer Maternal Uncle   . Other Maternal Uncle   . Cancer Sister        kidney  . Cancer Brother        lymph node  . Anesthesia problems Neg Hx   . Hypotension Neg Hx   . Malignant hyperthermia Neg Hx   . Pseudochol deficiency Neg Hx   .  Breast cancer Neg Hx    Social History:  reports that she has quit smoking. she has never used smokeless tobacco. She reports that she does not drink alcohol or use drugs.  Allergies:  Allergies  Allergen Reactions  . Codeine Phosphate Other (See Comments)    Hallucinations  . Codeine Other (See Comments)    Hallucinations     Medications Prior to Admission  Medication Sig Dispense Refill  . cetirizine (ZYRTEC) 10 MG tablet Take 10 mg by mouth daily.    . diclofenac (VOLTAREN) 75 MG EC tablet TAKE 1 TABLET BY MOUTH TWICE DAILY (Patient taking differently: Take 75 mg by mouth 2 (two) times daily. TAKE 1 TABLET BY MOUTH TWICE DAILY) 180 tablet 3  . diphenhydrAMINE (BENADRYL) 25 mg capsule Take 50 mg by mouth at bedtime as needed for sleep (takes with Ibuprofen for sleep).    [name] ibuprofen (ADVIL,MOTRIN) 200 MG tablet Take 400 mg by mouth at bedtime as needed (SLEEP). Takes with the Benadryl for sleep    . lidocaine-prilocaine (EMLA)  cream APPLY PRIOR TO PORT AXCESS  3  . meclizine (ANTIVERT) 25 MG tablet Take 1 tablet (25 mg total) by mouth every 6 (six) hours as needed for dizziness. 60 tablet 0  . ondansetron (ZOFRAN) 4 MG tablet Take 1 tablet (4 mg total) by mouth every 8 (eight) hours as needed for nausea or vomiting. 20 tablet 0  . UNABLE TO FIND Rx: [id number]- Post Surgical Bras (Quantity: 6) E9528- Silicone Breast Prosthesis (Quantity: 1) Dx: 174.9; Right Mastectomy 1 each 0    Results for orders placed or performed in visit on 12/06/16 (from the past 48 hour(s))  Comprehensive metabolic panel     Status: Abnormal   Collection Time: 12/06/16 10:57 AM  Result Value Ref Range   Sodium 143 136 - 145 mEq/L   Potassium 4.2 3.5 - 5.1 mEq/L   Chloride 108 98 - 109 mEq/L   CO2 27 22 - 29 mEq/L   Glucose 96 70 - 140 mg/dl    Comment: Glucose reference range is for nonfasting patients. Fasting glucose reference range is 70- 100.   BUN 18.5 7.0 - 26.0 mg/dL   Creatinine 0.8 0.6 - 1.1 mg/dL    Total Bilirubin 0.56 0.20 - 1.20 mg/dL   Alkaline Phosphatase 110 40 - 150 U/L   AST 41 (H) 5 - 34 U/L   ALT 57 (H) 0 - 55 U/L   Total Protein 6.5 6.4 - 8.3 g/dL   Albumin 3.7 3.5 - 5.0 g/dL   Calcium 9.5 8.4 - 10.4 mg/dL   Anion Gap 8 3 - 11 mEq/L   EGFR >60 >60 ml/min/1.73 m2    Comment: eGFR is calculated using the CKD-EPI Creatinine Equation (2009)  CBC with Differential     Status: Abnormal   Collection Time: 12/06/16 10:57 AM  Result Value Ref Range   WBC 6.2 3.9 - 10.3 10e3/uL   NEUT# 4.3 1.5 - 6.5 10e3/uL   HGB 14.5 11.6 - 15.9 g/dL   HCT 43.4 34.8 - 46.6 %   Platelets 148 145 - 400 10e3/uL   MCV 100.0 79.5 - 101.0 fL   MCH 33.4 25.1 - 34.0 pg   MCHC 33.4 31.5 - 36.0 g/dL   RBC 4.34 3.70 - 5.45 10e6/uL   RDW 12.7 11.2 - 14.5 %   lymph# 0.8 (L) 0.9 - 3.3 10e3/uL   MONO# 0.8 0.1 - 0.9 10e3/uL   Eosinophils Absolute 0.3 0.0 - 0.5 10e3/uL   Basophils Absolute 0.1 0.0 - 0.1 10e3/uL   NEUT% 68.5 38.4 - 76.8 %   LYMPH% 13.1 (L) 14.0 - 49.7 %   MONO% 13.4 0.0 - 14.0 %   EOS% 4.0 0.0 - 7.0 %   BASO% 1.0 0.0 - 2.0 %   No results found.  Review of Systems  All other systems reviewed and are negative.   Blood pressure (!) 185/70, pulse 78, temperature 97.9 F (36.6 C), temperature source Oral, resp. rate 18, weight 94.3 kg (208 lb), SpO2 97 %. Physical Exam  cv rrr Lungs clear Port in place  Assessment/Plan Port removal   [place], MD 12/07/2016, 8:13 AM

## 2016-12-07 NOTE — Op Note (Signed)
Preoperative diagnosis: history lymphoma and breast cancer, no longer needs venous access Postoperative diagnosis: same as above Procedure:RightIJ port removal Surgeon: Dr Serita Grammes TJQ:[id number] Anes: local mac Specimens none Complications none Drains none Sponge count correct Dispo to pacu stable  Indications: This is a39 yof who no longer needs venous access. Discussed port removal.   Procedure: After informed consent was obtained the patient was taken to the operating room.she was placed under monitored anesthesia care Then she was prepped and draped in the standard sterile surgical fashion. Surgical timeout was then performed. I infiltrated local anesthetic in the old scar. I then reentered the old incision. I removed the port and line in their entirety.  All foreign body was removed. Hemostasis was observed.  The skin was closed with 3-0 vicryl and 4-0 monocryl. Glue  was applied.

## 2016-12-07 NOTE — Transfer of Care (Signed)
Immediate Anesthesia Transfer of Care Note  Patient: Selena [name]  Procedure(s) Performed: REMOVAL PORT-A-CATH (N/A Chest)  Patient Location: PACU  Anesthesia Type:MAC  Level of Consciousness: awake, alert  and oriented  Airway & Oxygen Therapy: Patient Spontanous Breathing  Post-op Assessment: Report given to RN and Post -op Vital signs reviewed and stable  Post vital signs: Reviewed and stable  Last Vitals:  Vitals:   12/07/16 0631 12/07/16 0851  BP: (!) 185/70   Pulse: 78   Resp: 18   Temp: 36.6 C 36.6 C  SpO2: 97%     Last Pain:  Vitals:   12/07/16 0631  TempSrc: Oral      Patients Stated Pain Goal: 3 (96/78/93 8101)  Complications: No apparent anesthesia complications

## 2016-12-07 NOTE — Anesthesia Postprocedure Evaluation (Signed)
Anesthesia Post Note  Patient: [name]  Procedure(s) Performed: REMOVAL PORT-A-CATH (N/A Chest)     Patient location during evaluation: PACU Anesthesia Type: MAC Level of consciousness: awake and alert Pain management: pain level controlled Vital Signs Assessment: post-procedure vital signs reviewed and stable Respiratory status: spontaneous breathing, nonlabored ventilation and respiratory function stable Cardiovascular status: stable and blood pressure returned to baseline Postop Assessment: no apparent nausea or vomiting Anesthetic complications: no    Last Vitals:  Vitals:   12/07/16 0910 12/07/16 0920  BP: (!) 168/78 (!) 171/74  Pulse: 67   Resp: 13   Temp: 36.6 C   SpO2: 98% 98%    Last Pain:  Vitals:   12/07/16 0910  TempSrc:   PainSc: 0-No pain                 Annalis Kaczmarczyk,W. EDMOND

## 2016-12-07 NOTE — Discharge Instructions (Signed)
° ° °  PORT-A-CATH: POST OP INSTRUCTIONS  Always review your discharge instruction sheet given to you by the facility where your surgery was performed.   1. A prescription for pain medication may be given to you upon discharge. Take your pain medication as prescribed, if needed. If narcotic pain medicine is not needed, then you make take acetaminophen (Tylenol) or ibuprofen (Advil) as needed.  2. Take your usually prescribed medications unless otherwise directed. 3. If you need a refill on your pain medication, please contact our office. All narcotic pain medicine now requires a paper prescription.  Phoned in and fax refills are no longer allowed by law.  Prescriptions will not be filled after 5 pm or on weekends.  4. You should follow a light diet for the remainder of the day after your procedure. 5. Most patients will experience some mild swelling and/or bruising in the area of the incision. It may take several days to resolve. 6. It is common to experience some constipation if taking pain medication after surgery. Increasing fluid intake and taking a stool softener (such as Colace) will usually help or prevent this problem from occurring. A mild laxative (Milk of Magnesia or Miralax) should be taken according to package directions if there are no bowel movements after 48 hours.  7. Unless discharge instructions indicate otherwise, you may remove your bandages 48 hours after surgery, and you may shower at that time. You may have steri-strips (small white skin tapes) in place directly over the incision.  These strips should be left on the skin for 7-10 days.  If your surgeon used Dermabond (skin glue) on the incision, you may shower in 24 hours.  The glue will flake off over the next 2-3 weeks.  8. ACTIVITIES:  Limit activity involving your arms for the next 72 hours. Do no strenuous exercise or activity for 1 week. You may drive when you are no longer taking prescription pain medication, you can  comfortably wear a seatbelt, and you can maneuver your car. 10.You may need to see your doctor in the office for a follow-up appointment.  Please       check with your doctor.    WHEN TO CALL YOUR DOCTOR [phone]): 1. Fever over 101.0 2. Chills 3. Continued bleeding from incision 4. Increased redness and tenderness at the site 5. Shortness of breath, difficulty breathing   The clinic staff is available to answer your questions during regular business hours. Please dont hesitate to call and ask to speak to one of the nurses or medical assistants for clinical concerns. If you have a medical emergency, go to the nearest emergency room or call 911.  A surgeon from [facility] is always on call at the hospital.     For further information, please visit [email or web address]

## 2016-12-08 ENCOUNTER — Encounter (HOSPITAL_COMMUNITY): Payer: Self-pay | Admitting: General Surgery

## 2017-01-27 ENCOUNTER — Ambulatory Visit: Payer: Medicare Other | Admitting: Family Medicine

## 2017-01-27 ENCOUNTER — Other Ambulatory Visit: Payer: Self-pay | Admitting: Family Medicine

## 2017-01-27 ENCOUNTER — Encounter: Payer: Self-pay | Admitting: Family Medicine

## 2017-01-27 VITALS — BP 130/90 | HR 72 | Temp 98.2°F | Wt 210.1 lb

## 2017-01-27 DIAGNOSIS — B9789 Other viral agents as the cause of diseases classified elsewhere: Secondary | ICD-10-CM

## 2017-01-27 DIAGNOSIS — J069 Acute upper respiratory infection, unspecified: Secondary | ICD-10-CM | POA: Diagnosis not present

## 2017-01-27 MED ORDER — FLUTICASONE PROPIONATE 50 MCG/ACT NA SUSP: 1.0000 | Freq: Every day | NASAL | 0 refills | Status: DC

## 2017-01-27 MED ORDER — HYDROCODONE-HOMATROPINE 5-1.5 MG/5ML PO SYRP: 5.0000 mL | ORAL_SOLUTION | Freq: Three times a day (TID) | ORAL | 0 refills | Status: DC | PRN

## 2017-01-27 NOTE — Progress Notes (Signed)
Subjective:    Patient ID: Selena [name], female    DOB: 05-25-1935, 82 y.o.   MRN: [id number]  No chief complaint on file.   HPI Patient was seen today for acute concern.  Patient with dry cough, nasal congestion, rhinorrhea that started on Wednesday.  Patient has taken Mucinex and using throat lozenges, but states the cough is keeping her up at night.  Sick contacts include patient's daughter.  Past Medical History:  Diagnosis Date  . Anemia    PMH  . Atrial fibrillation ([place]) 01/01/2007  . BACK PAIN, UPPER 07/09/2008  . Breast cancer, stage 1 ([place])   . Bruises easily   . Cataract    history  . Complication of anesthesia   . Diverticulitis   . DIVERTICULOSIS, COLON 12/26/2006  . Follicular non-Hodgkin's lymphoma ([place])   . Goiter    multi-nodular  . Hx of colonoscopy 2009  . LIVER FUNCTION TESTS, ABNORMAL, HX OF 12/26/2007  . Memory loss 05/16/2007  . OSTEOARTHRITIS 12/26/2006   takes Diclofenac daily but has stopped for surgery  . PONV (postoperative nausea and vomiting)   . Skin cancer   . VERTIGO 09/07/2009  . Vertigo    hx of;had Phenergan prn   . Wears dentures   . Wears glasses   . Wears hearing aid    bilateral    Allergies  Allergen Reactions  . Codeine Phosphate Other (See Comments)    Hallucinations  . Codeine Other (See Comments)    Hallucinations     ROS General: Denies fever, chills, night sweats, changes in weight, changes in appetite HEENT: Denies headaches, ear pain, changes in vision, sore throat  + nasal congestion, rhinorrhea CV: Denies CP, palpitations, SOB, orthopnea Pulm: Denies SOB, wheezing  + dry cough GI: Denies abdominal pain, nausea, vomiting, diarrhea, constipation GU: Denies dysuria, hematuria, frequency, vaginal discharge Msk: Denies muscle cramps, joint pains Neuro: Denies weakness, numbness, tingling Skin: Denies rashes, bruising Psych: Denies depression, anxiety, hallucinations     Objective:    Blood pressure 130/90, pulse  72, temperature 98.2 F (36.8 C), temperature source Oral, weight 210 lb 1.6 oz (95.3 kg), SpO2 97 %.   Gen. Pleasant, well-nourished, in no distress, normal affect   HEENT: [place]/AT, face symmetric, no scleral icterus, PERRLA, nares patent with clear drainage, pharynx with mild erythema and postnasal drainage, no exudate. Lungs: Coughing, no accessory muscle use, CTAB, no wheezes or rales Cardiovascular: RRR, no m/r/g, no peripheral edema Neuro:  A&Ox3, CN II-XII intact, normal gait Skin:  Warm, no lesions/ rash   Wt Readings from Last 3 Encounters:  01/27/17 210 lb 1.6 oz (95.3 kg)  12/07/16 208 lb (94.3 kg)  12/06/16 208 lb 4.8 oz (94.5 kg)    Lab Results  Component Value Date   WBC 6.2 12/06/2016   HGB 14.5 12/06/2016   HCT 43.4 12/06/2016   PLT 148 12/06/2016   GLUCOSE 96 12/06/2016   CHOL 204 (H) 08/23/2016   TRIG 202.0 (H) 08/23/2016   HDL 40.30 08/23/2016   LDLDIRECT 133.0 08/23/2016   LDLCALC 111 (H) 08/12/2013   ALT 57 (H) 12/06/2016   AST 41 (H) 12/06/2016   NA 143 12/06/2016   K 4.2 12/06/2016   CL 107 12/01/2016   CREATININE 0.8 12/06/2016   BUN 18.5 12/06/2016   CO2 27 12/06/2016   TSH 0.65 08/23/2016   INR 0.98 07/24/2014    Assessment/Plan:  Viral URI with cough  -Supportive care.  Okay to continue Mucinex. -Plan: HYDROcodone-homatropine (  HYCODAN) 5-1.5 MG/5ML syrup, fluticasone (FLONASE) 50 MCG/ACT nasal spray -Given RTC or ED precautions. -Follow-up PRN   Grier Mitts, MD

## 2017-01-27 NOTE — Patient Instructions (Addendum)

## 2017-04-05 DIAGNOSIS — H26493 Other secondary cataract, bilateral: Secondary | ICD-10-CM | POA: Diagnosis not present

## 2017-04-05 DIAGNOSIS — H04123 Dry eye syndrome of bilateral lacrimal glands: Secondary | ICD-10-CM | POA: Diagnosis not present

## 2017-04-05 DIAGNOSIS — H35361 Drusen (degenerative) of macula, right eye: Secondary | ICD-10-CM | POA: Diagnosis not present

## 2017-04-19 ENCOUNTER — Ambulatory Visit: Payer: Medicare Other | Admitting: Adult Health

## 2017-04-19 ENCOUNTER — Encounter: Payer: Self-pay | Admitting: Adult Health

## 2017-04-19 VITALS — BP 130/80 | HR 73 | Temp 97.9°F | Wt 205.8 lb

## 2017-04-19 DIAGNOSIS — N3001 Acute cystitis with hematuria: Secondary | ICD-10-CM | POA: Diagnosis not present

## 2017-04-19 LAB — POCT URINALYSIS DIPSTICK
Bilirubin, UA: NEGATIVE
Glucose, UA: NEGATIVE
KETONES UA: NEGATIVE
PH UA: 6 (ref 5.0–8.0)
Spec Grav, UA: >=1.03 — AB (ref 1.010–1.025)
UROBILINOGEN UA: 0.2 U/dL

## 2017-04-19 MED ORDER — CIPROFLOXACIN HCL 500 MG PO TABS: 500.0000 mg | ORAL_TABLET | Freq: Two times a day (BID) | ORAL | 0 refills | Status: DC

## 2017-04-19 NOTE — Progress Notes (Signed)
ZOX:[id number], Selena Sou, MD Chief Complaint  Patient presents with  . Dysuria    patient claims when she urinates it feels like something is "setting her on fire" Sx has been going on for 4 days ago. patient has a hx of cystitis and patient think that it may be the issue.     Current Issues:  Presents with 3 days of dysuria, urinary urgency and urinary frequency Associated symptoms include:  dysuria, hematuria and urinary frequency  There is a previous history of of similar symptoms. Sexually active:  No   No concern for STI.  Prior to Admission medications   Medication Sig Start Date End Date Taking? Authorizing Provider  cetirizine (ZYRTEC) 10 MG tablet Take 10 mg by mouth daily.   Yes [provider]  ibuprofen (ADVIL,MOTRIN) 200 MG tablet Take 400 mg by mouth at bedtime as needed (SLEEP). Takes with the Benadryl for sleep   Yes [provider]  meclizine (ANTIVERT) 25 MG tablet Take 1 tablet (25 mg total) by mouth every 6 (six) hours as needed for dizziness. 02/04/15  Yes Marletta Lor, MD  ondansetron (ZOFRAN) 4 MG tablet Take 1 tablet (4 mg total) by mouth every 8 (eight) hours as needed for nausea or vomiting. 08/23/16  Yes Marletta Lor, MD  UNABLE TO FIND Rx: [phone]- Post Surgical Bras (Quantity: 6) W2956- Silicone Breast Prosthesis (Quantity: 1) Dx: 174.9; Right Mastectomy 12/07/12  Yes Neldon Mc, MD    Review of Systems: See HPI   PE:  BP 130/80 (BP Location: Left Arm, Patient Position: Sitting, Cuff Size: Large)   Pulse 73   Temp 97.9 F (36.6 C) (Oral)   Wt 205 lb 12.8 oz (93.4 kg)   SpO2 97%   BMI 32.23 kg/m  Constitutional: Alert and oriented. No acute distress.  Heart: Normal rate and normal rhythm.  Lungs: Clear to auscultation  Back: Chronic low back pain  Abdomen: Pain with palpation to lower abdomen    Results for orders placed or performed in visit on 12/06/16  Comprehensive metabolic panel  Result Value Ref Range    Sodium 143 136 - 145 mEq/L   Potassium 4.2 3.5 - 5.1 mEq/L   Chloride 108 98 - 109 mEq/L   CO2 27 22 - 29 mEq/L   Glucose 96 70 - 140 mg/dl   BUN 18.5 7.0 - 26.0 mg/dL   Creatinine 0.8 0.6 - 1.1 mg/dL   Total Bilirubin 0.56 0.20 - 1.20 mg/dL   Alkaline Phosphatase 110 40 - 150 U/L   AST 41 (H) 5 - 34 U/L   ALT 57 (H) 0 - 55 U/L   Total Protein 6.5 6.4 - 8.3 g/dL   Albumin 3.7 3.5 - 5.0 g/dL   Calcium 9.5 8.4 - 10.4 mg/dL   Anion Gap 8 3 - 11 mEq/L   EGFR >60 >60 ml/min/1.73 m2  CBC with Differential  Result Value Ref Range   WBC 6.2 3.9 - 10.3 10e3/uL   NEUT# 4.3 1.5 - 6.5 10e3/uL   HGB 14.5 11.6 - 15.9 g/dL   HCT 43.4 34.8 - 46.6 %   Platelets 148 145 - 400 10e3/uL   MCV 100.0 79.5 - 101.0 fL   MCH 33.4 25.1 - 34.0 pg   MCHC 33.4 31.5 - 36.0 g/dL   RBC 4.34 3.70 - 5.45 10e6/uL   RDW 12.7 11.2 - 14.5 %   lymph# 0.8 (L) 0.9 - 3.3 10e3/uL   MONO# 0.8 0.1 - 0.9 10e3/uL  Eosinophils Absolute 0.3 0.0 - 0.5 10e3/uL   Basophils Absolute 0.1 0.0 - 0.1 10e3/uL   NEUT% 68.5 38.4 - 76.8 %   LYMPH% 13.1 (L) 14.0 - 49.7 %   MONO% 13.4 0.0 - 14.0 %   EOS% 4.0 0.0 - 7.0 %   BASO% 1.0 0.0 - 2.0 %    Assessment and Plan:   1. Acute cystitis with hematuria  - ciprofloxacin (CIPRO) 500 MG tablet; Take 1 tablet (500 mg total) by mouth 2 (two) times daily.  Dispense: 6 tablet; Refill: 0 - Urine culture - POC Urinalysis Dipstick + Leuks, Nit, and blo - Stay hydrated and rest  - Follow up if no improvement in the next 2-3 days   Dorothyann Peng, NP

## 2017-04-21 LAB — URINE CULTURE
MICRO NUMBER:: 90382577
SPECIMEN QUALITY:: ADEQUATE

## 2017-05-04 DIAGNOSIS — H353112 Nonexudative age-related macular degeneration, right eye, intermediate dry stage: Secondary | ICD-10-CM | POA: Diagnosis not present

## 2017-05-04 DIAGNOSIS — H353121 Nonexudative age-related macular degeneration, left eye, early dry stage: Secondary | ICD-10-CM | POA: Diagnosis not present

## 2017-05-19 ENCOUNTER — Other Ambulatory Visit: Payer: Self-pay | Admitting: Internal Medicine

## 2017-05-19 DIAGNOSIS — Z1231 Encounter for screening mammogram for malignant neoplasm of breast: Secondary | ICD-10-CM

## 2017-06-02 DIAGNOSIS — H02839 Dermatochalasis of unspecified eye, unspecified eyelid: Secondary | ICD-10-CM | POA: Diagnosis not present

## 2017-06-02 DIAGNOSIS — H18411 Arcus senilis, right eye: Secondary | ICD-10-CM | POA: Diagnosis not present

## 2017-06-02 DIAGNOSIS — Z961 Presence of intraocular lens: Secondary | ICD-10-CM | POA: Diagnosis not present

## 2017-06-02 DIAGNOSIS — H26491 Other secondary cataract, right eye: Secondary | ICD-10-CM | POA: Diagnosis not present

## 2017-06-05 ENCOUNTER — Telehealth: Payer: Self-pay | Admitting: Hematology and Oncology

## 2017-06-05 ENCOUNTER — Inpatient Hospital Stay: Payer: Medicare Other

## 2017-06-05 ENCOUNTER — Inpatient Hospital Stay: Payer: Medicare Other | Attending: Hematology and Oncology | Admitting: Hematology and Oncology

## 2017-06-05 DIAGNOSIS — Z79899 Other long term (current) drug therapy: Secondary | ICD-10-CM | POA: Diagnosis not present

## 2017-06-05 DIAGNOSIS — C50411 Malignant neoplasm of upper-outer quadrant of right female breast: Secondary | ICD-10-CM | POA: Diagnosis not present

## 2017-06-05 DIAGNOSIS — Z9221 Personal history of antineoplastic chemotherapy: Secondary | ICD-10-CM | POA: Diagnosis not present

## 2017-06-05 DIAGNOSIS — C8203 Follicular lymphoma grade I, intra-abdominal lymph nodes: Secondary | ICD-10-CM | POA: Diagnosis not present

## 2017-06-05 DIAGNOSIS — Z170 Estrogen receptor positive status [ER+]: Secondary | ICD-10-CM | POA: Diagnosis not present

## 2017-06-05 DIAGNOSIS — Z79811 Long term (current) use of aromatase inhibitors: Secondary | ICD-10-CM | POA: Diagnosis not present

## 2017-06-05 DIAGNOSIS — Z9011 Acquired absence of right breast and nipple: Secondary | ICD-10-CM | POA: Diagnosis not present

## 2017-06-05 LAB — CBC WITH DIFFERENTIAL/PLATELET
Basophils Absolute: 0.1 10*3/uL (ref 0.0–0.1)
Basophils Relative: 1 %
Eosinophils Absolute: 0.7 10*3/uL — ABNORMAL HIGH (ref 0.0–0.5)
Eosinophils Relative: 8 %
HEMATOCRIT: 43.1 % (ref 34.8–46.6)
HEMOGLOBIN: 14.3 g/dL (ref 11.6–15.9)
LYMPHS ABS: 1.2 10*3/uL (ref 0.9–3.3)
LYMPHS PCT: 14 %
MCH: 32.5 pg (ref 25.1–34.0)
MCHC: 33.2 g/dL (ref 31.5–36.0)
MCV: 98 fL (ref 79.5–101.0)
Monocytes Absolute: 0.8 10*3/uL (ref 0.1–0.9)
Monocytes Relative: 10 %
NEUTROS ABS: 5.5 10*3/uL (ref 1.5–6.5)
NEUTROS PCT: 67 %
Platelets: 168 10*3/uL (ref 145–400)
RBC: 4.4 MIL/uL (ref 3.70–5.45)
RDW: 12.9 % (ref 11.2–14.5)
WBC: 8.2 10*3/uL (ref 3.9–10.3)

## 2017-06-05 LAB — COMPREHENSIVE METABOLIC PANEL
ALK PHOS: 105 U/L (ref 40–150)
ALT: 44 U/L (ref 0–55)
AST: 38 U/L — AB (ref 5–34)
Albumin: 4 g/dL (ref 3.5–5.0)
Anion gap: 7 (ref 3–11)
BUN: 16 mg/dL (ref 7–26)
CHLORIDE: 110 mmol/L — AB (ref 98–109)
CO2: 25 mmol/L (ref 22–29)
Calcium: 9.5 mg/dL (ref 8.4–10.4)
Creatinine, Ser: 0.65 mg/dL (ref 0.60–1.10)
GFR calc Af Amer: >60 mL/min (ref >60)
Glucose, Bld: 89 mg/dL (ref 70–140)
POTASSIUM: 4.2 mmol/L (ref 3.5–5.1)
Sodium: 142 mmol/L (ref 136–145)
Total Bilirubin: 0.6 mg/dL (ref 0.2–1.2)
Total Protein: 6.5 g/dL (ref 6.4–8.3)

## 2017-06-05 LAB — LACTATE DEHYDROGENASE: LDH: 270 U/L — AB (ref 125–245)

## 2017-06-05 NOTE — Assessment & Plan Note (Signed)
Retroperitoneal mass biopsy left side 61/68/3729: Follicular lymphoma low-grade grade 1-2, flow cytometry positive for CD10, 20, 3, 10, 5, BCL-2, BCl-6, CD21, Ki-67 less than 10%, stage IB (8X 4 cm mass)  Treatment plan:systemic chemotherapy with bendamustine and Rituxan 6 cycles ( From 08/13/2014 to 01/01/2015) followed by Rituxan maintenance every 2 months for 2 years  PET/CT scan 01/16/2015: Interval decrease in size and metabolic activity   I discussed with her that in the future scans will be done on an as-needed basis. There is no role of routine CT scans for surveillance for follicular lymphoma.  Clinical examination and lab reports seem to suggest no evidence of active lymphoma so we can watch and monitor.

## 2017-06-05 NOTE — Telephone Encounter (Signed)
Gave patient AVs and calendar of upcoming November appointments.  °

## 2017-06-05 NOTE — Progress Notes (Signed)
Patient Care Team: Selena Lor, MD as PCP - General  DIAGNOSIS:  Encounter Diagnoses  Name Primary?  . Malignant neoplasm of upper-outer quadrant of right breast in female, estrogen receptor positive (Selena [name])   . Follicular 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 (Selena [name])     SUMMARY OF ONCOLOGIC HISTORY:   Breast cancer of upper-outer quadrant of right female breast (Selena [name])   04/14/2011 Surgery    Right mastectomy: 2 foci of invasive ductal carcinoma 0.2 cm, grade 1 with background extensive DCIS, 0/5 lymph nodes negative,focus 1: ER 94%, PR 100%, Ki-67 5%, HER-2 negative: Focus to ER 100%BR 6% Ki-67 29% HER-2 negative      05/02/2011 -  Anti-estrogen oral therapy    letrozole 2.5 mg daily       Follicular 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 (Selena [name])   07/24/2014 Initial Diagnosis    Retroperitoneal mass biopsy left side: Follicular lymphoma low-grade grade 1-2, flow cytometry positive for CD10, 20, 3, 10, 5, BCL-2, BCl-6, CD21, Ki-67 less than 10%      08/13/2014 -  Chemotherapy    Bendamustine and Rituxan day 1, 2 every 4 weeks 6 cycles followed by Rituxan maintenance every 2 months 2 years      11/03/2014 Imaging    midpoint of chemotherapy: Moderate improvement in the periaortic lymphadenopathy without complete resolution      01/16/2015 PET scan     interval decrease in size and metabolic activity of the large left retroperitoneal mass , no increase in activity, size 10 mm decreased from 48 mm  SUV 1.9 , no additional hypermetabolic activity       CHIEF COMPLIANT: Follow-up of follicular lymphoma after completion of systemic chemo  INTERVAL HISTORY: Selena [name] is a 82 year old with above-mentioned history of follicular lymphoma as well as right breast cancer.  She is tolerating letrozole extremely well.  She has been on it for the past 6 years.  I recommended 7 years of total therapy.  She denies any hot flashes or myalgias.  The follicular  lymphoma standpoint she was diagnosed with follicular lymphoma and received bendamustine Rituxan therapy as well as Rituxan maintenance for 2 years.  She is here for routine surveillance and follow-up.  She denies any new lumps or nodules.  Denies any fevers or chills or night sweats.  REVIEW OF SYSTEMS:   Constitutional: Denies fevers, chills or abnormal weight loss Eyes: Denies blurriness of vision Ears, nose, mouth, throat, and face: Denies mucositis or sore throat Respiratory: Denies cough, dyspnea or wheezes Cardiovascular: Denies palpitation, chest discomfort Gastrointestinal:  Denies nausea, heartburn or change in bowel habits Skin: Denies abnormal skin rashes Lymphatics: Denies new lymphadenopathy or easy bruising Neurological:Denies numbness, tingling or new weaknesses Behavioral/Psych: Mood is stable, no new changes  Extremities: No lower extremity edema Breast:  denies any pain or lumps or nodules in either breasts All other systems were reviewed with the patient and are negative.  I have reviewed the past medical history, past surgical history, social history and family history with the patient and they are unchanged from previous note.  ALLERGIES:  is allergic to codeine phosphate and codeine.  MEDICATIONS:  Current Outpatient Medications  Medication Sig Dispense Refill  . cetirizine (ZYRTEC) 10 MG tablet Take 10 mg by mouth daily.    . meclizine (ANTIVERT) 25 MG tablet Take 1 tablet (25 mg total) by mouth every 6 (six) hours as needed for dizziness. 60 tablet 0  . ondansetron (ZOFRAN) 4  MG tablet Take 1 tablet (4 mg total) by mouth every 8 (eight) hours as needed for nausea or vomiting. 20 tablet 0   No current facility-administered medications for this visit.     PHYSICAL EXAMINATION: ECOG PERFORMANCE STATUS: 1 - Symptomatic but completely ambulatory  Vitals:   06/05/17 1101  BP: (!) 167/74  Pulse: 60  Resp: 17  Temp: 97.9 F (36.6 C)  SpO2: 98%   Filed  Weights   06/05/17 1101  Weight: 206 lb 12.8 oz (93.8 kg)    GENERAL:alert, no distress and comfortable SKIN: skin color, texture, turgor are normal, no rashes or significant lesions EYES: normal, Conjunctiva are pink and non-injected, sclera clear OROPHARYNX:no exudate, no erythema and lips, buccal mucosa, and tongue normal  NECK: supple, thyroid normal size, non-tender, without nodularity LYMPH:  no palpable lymphadenopathy in the cervical, axillary or inguinal LUNGS: clear to auscultation and percussion with normal breathing effort HEART: regular rate & rhythm and no murmurs and no lower extremity edema ABDOMEN:abdomen soft, non-tender and normal bowel sounds MUSCULOSKELETAL:no cyanosis of digits and no clubbing  NEURO: alert & oriented x 3 with fluent speech, no focal motor/sensory deficits EXTREMITIES: No lower extremity edema  LABORATORY DATA:  I have reviewed the data as listed CMP Latest Ref Rng & Units 06/05/2017 12/06/2016 12/01/2016  Glucose 70 - 140 mg/dL 89 96 99  BUN 7 - 26 mg/dL 16 18.5 20  Creatinine 0.60 - 1.10 mg/dL 0.65 0.8 0.68  Sodium 136 - 145 mmol/L 142 143 138  Potassium 3.5 - 5.1 mmol/L 4.2 4.2 4.3  Chloride 98 - 109 mmol/L 110(H) - 107  CO2 22 - 29 mmol/L '25 27 23  ' Calcium 8.4 - 10.4 mg/dL 9.5 9.5 9.4  Total Protein 6.4 - 8.3 g/dL 6.5 6.5 6.2(L)  Total Bilirubin 0.2 - 1.2 mg/dL 0.6 0.56 0.8  Alkaline Phos 40 - 150 U/L 105 110 103  AST 5 - 34 U/L 38(H) 41(H) 46(H)  ALT 0 - 55 U/L 44 57(H) 59(H)    Lab Results  Component Value Date   WBC 8.2 06/05/2017   HGB 14.3 06/05/2017   HCT 43.1 06/05/2017   MCV 98.0 06/05/2017   PLT 168 06/05/2017   NEUTROABS 5.5 06/05/2017    ASSESSMENT & PLAN:  Breast cancer of upper-outer quadrant of right female breast (Selena [name]) Right breast cancer: Right mastectomy 04/14/2011: 2 foci of invasive ductal carcinoma 0.2 cm, grade 1 with background extensive DCIS, 0/5 lymph nodes negative,focus 1: ER 94%, PR 100%, Ki-67 5%,  HER-2 negative: Focus to ER 100% PR 6% Ki-67 29% HER-2 negative Femara 2.5 mg daily from2013- 2018. Because her primary tumor was very small and low risk, we did not recommendextended adjuvant therapy.  Breast Cancer Surveillance: 1. Breast exam 06/05/2017: Right mastectomy 2. Mammogram6/10/2019n abnormalities. Postsurgical changes. Breast Density Category B.  Return to clinic in 6 months labs and follow up    Follicular 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 Retroperitoneal mass biopsy left side 62/95/2841: Follicular lymphoma low-grade grade 1-2, flow cytometry positive for CD10, 20, 3, 10, 5, BCL-2, BCl-6, CD21, Ki-67 less than 10%, stage IB (8X 4 cm mass)  Treatment plan:systemic chemotherapy with bendamustine and Rituxan 6 cycles ( From 08/13/2014 to 01/01/2015) followed by Rituxan maintenance every 2 months for 2 years  PET/CT scan 01/16/2015: Interval decrease in size and metabolic activity   I discussed with her that in the future scans will be done on an as-needed basis. There is no role of  routine CT scans for surveillance for follicular lymphoma.  Clinical examination and lab reports seem to suggest no evidence of active lymphoma so we can watch and monitor.    Orders Placed This Encounter  Procedures  . CBC with Differential (Cancer Center Only)    Standing Status:   Future    Standing Expiration Date:   06/06/2018  . CMP ([facility] only)    Standing Status:   Future    Standing Expiration Date:   06/06/2018  . Lactate dehydrogenase (LDH)    Standing Status:   Future    Standing Expiration Date:   06/05/2018   The patient has a good understanding of the overall plan. she agrees with it. she will call with any problems that may develop before the next visit here.   Harriette Ohara, MD 06/05/17

## 2017-06-05 NOTE — Assessment & Plan Note (Signed)
Right breast cancer: Right mastectomy 04/14/2011: 2 foci of invasive ductal carcinoma 0.2 cm, grade 1 with background extensive DCIS, 0/5 lymph nodes negative,focus 1: ER 94%, PR 100%, Ki-67 5%, HER-2 negative: Focus to ER 100% PR 6% Ki-67 29% HER-2 negative Femara 2.5 mg daily from2013- 2018. Because her primary tumor was very small and low risk, we did not recommendextended adjuvant therapy.  Breast Cancer Surveillance: 1. Breast exam 06/05/2017: Right mastectomy 2. Mammogram6/10/2019n abnormalities. Postsurgical changes. Breast Density Category B.  Return to clinic in 6 months labs and follow up

## 2017-07-03 ENCOUNTER — Ambulatory Visit
Admission: RE | Admit: 2017-07-03 | Discharge: 2017-07-03 | Disposition: A | Discharge status: Home / Self Care | Payer: Medicare Other | Source: Ambulatory Visit | Attending: Internal Medicine | Admitting: Internal Medicine

## 2017-07-03 DIAGNOSIS — Z1231 Encounter for screening mammogram for malignant neoplasm of breast: Secondary | ICD-10-CM

## 2017-07-08 ENCOUNTER — Other Ambulatory Visit: Payer: Self-pay | Admitting: Internal Medicine

## 2017-07-10 NOTE — Telephone Encounter (Signed)
Pharmacy called in to follow up on refill request.   Please advise.

## 2017-07-14 ENCOUNTER — Telehealth: Payer: Self-pay

## 2017-07-14 NOTE — Telephone Encounter (Signed)
Fax received for refill on Diclofenac 75MG .

## 2017-07-17 NOTE — Telephone Encounter (Signed)
Okay to refill for the patient to take as needed for arthritis pain

## 2017-07-18 NOTE — Telephone Encounter (Signed)
Please advise on dosage and quantity.

## 2017-07-18 NOTE — Telephone Encounter (Signed)
diclofenac No. 90.Directions 1 twice daily as needed for arthritic pain

## 2017-07-19 ENCOUNTER — Other Ambulatory Visit: Payer: Self-pay | Admitting: Internal Medicine

## 2017-07-19 MED ORDER — DICLOFENAC SODIUM 75 MG PO TBEC: 75.0000 mg | DELAYED_RELEASE_TABLET | Freq: Two times a day (BID) | ORAL | 0 refills | Status: DC

## 2017-07-19 NOTE — Addendum Note (Signed)
Addended by: Gwenyth Ober R on: 07/19/2017 04:24 PM   Modules accepted: Orders

## 2017-07-19 NOTE — Telephone Encounter (Signed)
Rx sent to pharmacy. Will notify pt when phone lines are back up.

## 2017-07-20 NOTE — Telephone Encounter (Signed)
Pt notified of update.  

## 2017-07-25 ENCOUNTER — Encounter: Payer: Medicare Other | Admitting: Internal Medicine

## 2017-08-28 ENCOUNTER — Ambulatory Visit (INDEPENDENT_AMBULATORY_CARE_PROVIDER_SITE_OTHER): Payer: Medicare Other | Admitting: Internal Medicine

## 2017-08-28 ENCOUNTER — Encounter: Payer: Self-pay | Admitting: Internal Medicine

## 2017-08-28 VITALS — BP 150/60 | HR 71 | Temp 97.7°F | Ht 66.75 in | Wt 201.4 lb

## 2017-08-28 DIAGNOSIS — E785 Hyperlipidemia, unspecified: Secondary | ICD-10-CM

## 2017-08-28 DIAGNOSIS — Z23 Encounter for immunization: Secondary | ICD-10-CM

## 2017-08-28 DIAGNOSIS — M159 Polyosteoarthritis, unspecified: Secondary | ICD-10-CM

## 2017-08-28 DIAGNOSIS — C8203 Follicular lymphoma grade I, intra-abdominal lymph nodes: Secondary | ICD-10-CM

## 2017-08-28 DIAGNOSIS — R42 Dizziness and giddiness: Secondary | ICD-10-CM

## 2017-08-28 DIAGNOSIS — C50411 Malignant neoplasm of upper-outer quadrant of right female breast: Secondary | ICD-10-CM | POA: Diagnosis not present

## 2017-08-28 DIAGNOSIS — R945 Abnormal results of liver function studies: Secondary | ICD-10-CM

## 2017-08-28 DIAGNOSIS — R0609 Other forms of dyspnea: Secondary | ICD-10-CM

## 2017-08-28 DIAGNOSIS — Z0000 Encounter for general adult medical examination without abnormal findings: Secondary | ICD-10-CM | POA: Diagnosis not present

## 2017-08-28 DIAGNOSIS — R7989 Other specified abnormal findings of blood chemistry: Secondary | ICD-10-CM

## 2017-08-28 DIAGNOSIS — M150 Primary generalized (osteo)arthritis: Secondary | ICD-10-CM

## 2017-08-28 DIAGNOSIS — I480 Paroxysmal atrial fibrillation: Secondary | ICD-10-CM | POA: Diagnosis not present

## 2017-08-28 LAB — CBC WITH DIFFERENTIAL/PLATELET
BASOS PCT: 1.2 % (ref 0.0–3.0)
Basophils Absolute: 0.1 10*3/uL (ref 0.0–0.1)
EOS PCT: 4.9 % (ref 0.0–5.0)
Eosinophils Absolute: 0.4 10*3/uL (ref 0.0–0.7)
HEMATOCRIT: 44 % (ref 36.0–46.0)
HEMOGLOBIN: 15 g/dL (ref 12.0–15.0)
Lymphocytes Relative: 15.7 % (ref 12.0–46.0)
Lymphs Abs: 1.1 10*3/uL (ref 0.7–4.0)
MCHC: 34 g/dL (ref 30.0–36.0)
MCV: 97.3 fl (ref 78.0–100.0)
Monocytes Absolute: 0.7 10*3/uL (ref 0.1–1.0)
Monocytes Relative: 9.9 % (ref 3.0–12.0)
Neutro Abs: 4.9 10*3/uL (ref 1.4–7.7)
Neutrophils Relative %: 68.3 % (ref 43.0–77.0)
Platelets: 175 10*3/uL (ref 150.0–400.0)
RBC: 4.53 Mil/uL (ref 3.87–5.11)
RDW: 13.7 % (ref 11.5–15.5)
WBC: 7.2 10*3/uL (ref 4.0–10.5)

## 2017-08-28 LAB — COMPREHENSIVE METABOLIC PANEL
ALBUMIN: 4.3 g/dL (ref 3.5–5.2)
ALK PHOS: 91 U/L (ref 39–117)
ALT: 44 U/L — ABNORMAL HIGH (ref 0–35)
AST: 36 U/L (ref 0–37)
BUN: 16 mg/dL (ref 6–23)
CALCIUM: 9.7 mg/dL (ref 8.4–10.5)
CHLORIDE: 105 meq/L (ref 96–112)
CO2: 28 mEq/L (ref 19–32)
Creatinine, Ser: 0.63 mg/dL (ref 0.40–1.20)
GFR: 96.67 mL/min (ref >60.00)
Glucose, Bld: 97 mg/dL (ref 70–99)
POTASSIUM: 4.4 meq/L (ref 3.5–5.1)
Sodium: 142 mEq/L (ref 135–145)
TOTAL PROTEIN: 6.2 g/dL (ref 6.0–8.3)
Total Bilirubin: 0.7 mg/dL (ref 0.2–1.2)

## 2017-08-28 LAB — LIPID PANEL
CHOLESTEROL: 169 mg/dL (ref 0–200)
HDL: 43.2 mg/dL (ref >39.00)
LDL Cholesterol: 105 mg/dL — ABNORMAL HIGH (ref 0–99)
NONHDL: 125.52
TRIGLYCERIDES: 103 mg/dL (ref 0.0–149.0)
Total CHOL/HDL Ratio: 4
VLDL: 20.6 mg/dL (ref 0.0–40.0)

## 2017-08-28 LAB — TSH: TSH: 0.2 u[IU]/mL — AB (ref 0.35–4.50)

## 2017-08-28 MED ORDER — MELOXICAM 7.5 MG PO TABS: 7.5000 mg | ORAL_TABLET | Freq: Every day | ORAL | 3 refills | Status: DC

## 2017-08-28 MED ORDER — MECLIZINE HCL 25 MG PO TABS: 25.0000 mg | ORAL_TABLET | Freq: Four times a day (QID) | ORAL | 4 refills | Status: DC | PRN

## 2017-08-28 NOTE — Progress Notes (Signed)
Subjective:    Patient ID: Selena [name], female    DOB: 04-17-1935, 82 y.o.   MRN: [id number]  HPI  82 year old patient who is seen today for a preventive health examination and subsequent Medicare wellness visit She has a remote history of right breast cancer as well as a prior history of follicular non-Hodgkin's lymphoma.  She completed chemotherapy about 2 years ago and is followed periodically by oncology She has remote history of paroxysmal atrial fibrillation and underwent RFA in 2009 without recurrences. She has intermittent vertigo which has been chronic.  Her main complaint is arthritic.  She is status post bilateral total knee replacement surgeries. She also complains of dyspnea on exertion.  She states that in the past she could easily walk up and down her driveway 5 times but now is limited to 2 trips before she develops dyspnea.  Denies any exertional chest pain.  Past Medical History:  Diagnosis Date  . Anemia    PMH  . Atrial fibrillation ([place]) 01/01/2007  . BACK PAIN, UPPER 07/09/2008  . Breast cancer, stage 1 ([place])   . Bruises easily   . Cataract    history  . Complication of anesthesia   . Diverticulitis   . DIVERTICULOSIS, COLON 12/26/2006  . Follicular non-Hodgkin's lymphoma ([place])   . Goiter    multi-nodular  . Hx of colonoscopy 2009  . LIVER FUNCTION TESTS, ABNORMAL, HX OF 12/26/2007  . Memory loss 05/16/2007  . OSTEOARTHRITIS 12/26/2006   takes Diclofenac daily but has stopped for surgery  . PONV (postoperative nausea and vomiting)   . Skin cancer   . VERTIGO 09/07/2009  . Vertigo    hx of;had Phenergan prn   . Wears dentures   . Wears glasses   . Wears hearing aid    bilateral     Social History   Socioeconomic History  . Marital status: Widowed    Spouse name: Not on file  . Number of children: Not on file  . Years of education: Not on file  . Highest education level: Not on file  Occupational History  . Not on file  Social Needs  .  Financial resource strain: Not on file  . Food insecurity:    Worry: Not on file    Inability: Not on file  . Transportation needs:    Medical: Not on file    Non-medical: Not on file  Tobacco Use  . Smoking status: Former Research scientist (life sciences)  . Smokeless tobacco: Never Used  . Tobacco comment: stopped 20-30 yers ago  Substance and Sexual Activity  . Alcohol use: No  . Drug use: No  . Sexual activity: Yes    Birth control/protection: Surgical  Lifestyle  . Physical activity:    Days per week: Not on file    Minutes per session: Not on file  . Stress: Not on file  Relationships  . Social connections:    Talks on phone: Not on file    Gets together: Not on file    Attends religious service: Not on file    Active member of club or organization: Not on file    Attends meetings of clubs or organizations: Not on file    Relationship status: Not on file  . Intimate partner violence:    Fear of current or ex partner: Not on file    Emotionally abused: Not on file    Physically abused: Not on file    Forced sexual activity: Not on file  Other Topics Concern  . Not on file  Social History Narrative  . Not on file    Past Surgical History:  Procedure Laterality Date  . ABDOMINAL HYSTERECTOMY    . ABLATION OF DYSRHYTHMIC FOCUS  2009  . APPENDECTOMY    . AUGMENTATION MAMMAPLASTY Right 2013  . BREAST BIOPSY  2013  . BREAST RECONSTRUCTION  04/14/2011   Procedure: BREAST RECONSTRUCTION;  Surgeon: Crissie Reese, MD;  Location: [facility];  Service: Plastics;  Laterality: Right;  Placement of Right Breast Tissue Expander with  use of Flex HD for Breast Reconstruction  . BREAST SURGERY     right sm, snbx  . CATARACT EXTRACTION W/ INTRAOCULAR LENS  IMPLANT, BILATERAL  2010   bilateral  . COLONOSCOPY    . COLONOSCOPY    . KNEE SURGERY  2004/2010   arthroscopic/ bilateral knee replacements  . MASTECTOMY Right 2013  . PORT-A-CATH REMOVAL N/A 12/07/2016   Procedure: REMOVAL PORT-A-CATH;  Surgeon:  Rolm Bookbinder, MD;  Location: [facility];  Service: General;  Laterality: N/A;  . PORTACATH PLACEMENT Right 08/11/2014   Procedure: INSERTION PORT-A-CATH WITH ULTRASOUND;  Surgeon: Rolm Bookbinder, MD;  Location: [facility];  Service: General;  Laterality: Right;  . [place]   right  . tmi  1998  . TONSILLECTOMY      Family History  Problem Relation Age of Onset  . Colon cancer Mother   . Cancer Mother        colon  . Colon cancer Maternal Aunt   . Cancer Maternal Uncle   . Prostate cancer Maternal Uncle   . Prostate cancer Maternal Uncle   . Other Maternal Uncle   . Cancer Sister        kidney  . Cancer Brother        lymph node  . Anesthesia problems Neg Hx   . Hypotension Neg Hx   . Malignant hyperthermia Neg Hx   . Pseudochol deficiency Neg Hx   . Breast cancer Neg Hx     Allergies  Allergen Reactions  . Codeine Phosphate Other (See Comments)    Hallucinations  . Codeine Other (See Comments)    Hallucinations     Current Outpatient Medications on File Prior to Visit  Medication Sig Dispense Refill  . cetirizine (ZYRTEC) 10 MG tablet Take 10 mg by mouth daily.    . ondansetron (ZOFRAN) 4 MG tablet Take 1 tablet (4 mg total) by mouth every 8 (eight) hours as needed for nausea or vomiting. 20 tablet 0   No current facility-administered medications on file prior to visit.     BP (!) 150/60 (BP Location: Right Arm, Patient Position: Sitting, Cuff Size: Large)   Pulse 71   Temp 97.7 F (36.5 C) (Oral)   Ht 5' 6.75" (1.695 m)   Wt 201 lb 6.4 oz (91.4 kg)   SpO2 97%   BMI 31.78 kg/m   Subsequent Medicare wellness visit  1. Risk factors, based on past  M,S,F history.  No major cardiovascular risk factors.  History of paroxysmal atrial fibrillation treated with radiofrequency ablation in 2009  2.  Physical activities: Remains fairly active.  Does describe the decrease in exercise capacity with dyspnea on exertion  3.  Depression/mood:  No history  major depression or mood disorder 4.  Hearing: Uses bilateral hearing aids  5.  ADL's: Independent  6.  Fall risk: Low to moderate due to history of vertigo and arthritis affecting the knees  7.  Home safety: No issues identified  8.  Height weight, and visual acuity;  height and weight stable no change in visual acuity is followed by ophthalmology 9.  Counseling: Heart healthy diet recommended modest weight loss encouraged  10. Lab orders based on risk factors:  Laboratory update will be reviewed 11. Referral : Follow-up oncology   12. Care plan: We will continue efforts at aggressive risk factor modification.  Will review a screening lab.  If labs unremarkable will consider chest x-ray and PFTs to evaluate DOE  13. Cognitive assessment: Alert and appropriate normal affect.  Patient has had some concerns about cognitive impairment.  MMSE 28 out of 30 in the past  14. Screening: Patient provided with a written and personalized 5-10 year screening schedule in the AVS.    15. Provider List Update: Oncology audiology ophthalmology and primary care preventive health examination    Review of Systems  Constitutional: Negative.   HENT: Positive for hearing loss. Negative for congestion, dental problem, rhinorrhea, sinus pressure, sore throat and tinnitus.   Eyes: Negative for pain, discharge and visual disturbance.  Respiratory: Positive for shortness of breath. Negative for cough.   Cardiovascular: Negative for chest pain, palpitations and leg swelling.  Gastrointestinal: Positive for abdominal distention. Negative for abdominal pain, blood in stool, constipation, diarrhea, nausea and vomiting.  Genitourinary: Negative for difficulty urinating, dysuria, flank pain, frequency, hematuria, pelvic pain, urgency, vaginal bleeding, vaginal discharge and vaginal pain.  Musculoskeletal: Positive for arthralgias, back pain and gait problem. Negative for joint swelling.  Skin: Negative for rash.    Neurological: Positive for dizziness. Negative for syncope, speech difficulty, weakness, numbness and headaches.  Hematological: Negative for adenopathy.  Psychiatric/Behavioral: Negative for agitation, behavioral problems and dysphoric mood. The patient is not nervous/anxious.        Objective:   Physical Exam  Constitutional: She is oriented to person, place, and time. She appears well-developed and well-nourished.  Weight 201 Blood pressure well controlled  HENT:  Head: Normocephalic and atraumatic.  Right Ear: External ear normal.  Left Ear: External ear normal.  Mouth/Throat: Oropharynx is clear and moist.  Bilateral hearing aids in place Dentures noted  Eyes: Conjunctivae and EOM are normal.  Neck: Normal range of motion. Neck supple. No JVD present. No thyromegaly present.  Cardiovascular: Normal rate, regular rhythm, normal heart sounds and intact distal pulses.  No murmur heard. Pulmonary/Chest: Effort normal and breath sounds normal. She has no wheezes. She has no rales.  Status post right mastectomy  Abdominal: Soft. Bowel sounds are normal. She exhibits no distension and no mass. There is no tenderness. There is no rebound and no guarding.  Lower midline scar  Genitourinary: Vagina normal.  Musculoskeletal: Normal range of motion. She exhibits edema. She exhibits no tenderness.  Status post bilateral total knee replacements +1 ankle edema   Neurological: She is alert and oriented to person, place, and time. She has normal reflexes. She displays normal reflexes. No cranial nerve deficit. She exhibits normal muscle tone. Coordination normal.  Intact monofilament testing but absent vibratory sensation of the feet  Skin: Skin is warm and dry. No rash noted.  Psychiatric: She has a normal mood and affect. Her behavior is normal.          Assessment & Plan:   Preventive health examination Subsequent Medicare wellness visit History of right breast cancer History of  follicular non-Hodgkin's lymphoma.  Follow-up oncology DOE.  Will review lab if this is noncontributory will consider chest  x-ray and PFTs Osteoarthritis Paroxysmal atrial fibrillation.  Status post radiofrequency ablation 2009.  Remains stable  Oncology follow-up as scheduled  Marletta Lor

## 2017-08-28 NOTE — Patient Instructions (Signed)
Health Maintenance for Postmenopausal Women Menopause is a normal process in which your reproductive ability comes to an end. This process happens gradually over a span of months to years, usually between the ages of 22 and 9. Menopause is complete when you have missed 12 consecutive menstrual periods. It is important to talk with your health care provider about some of the most common conditions that affect postmenopausal women, such as heart disease, cancer, and bone loss (osteoporosis). Adopting a healthy lifestyle and getting preventive care can help to promote your health and wellness. Those actions can also lower your chances of developing some of these common conditions. What should I know about menopause? During menopause, you may experience a number of symptoms, such as:  Moderate-to-severe hot flashes.  Night sweats.  Decrease in sex drive.  Mood swings.  Headaches.  Tiredness.  Irritability.  Memory problems.  Insomnia.  Choosing to treat or not to treat menopausal changes is an individual decision that you make with your health care provider. What should I know about hormone replacement therapy and supplements? Hormone therapy products are effective for treating symptoms that are associated with menopause, such as hot flashes and night sweats. Hormone replacement carries certain risks, especially as you become older. If you are thinking about using estrogen or estrogen with progestin treatments, discuss the benefits and risks with your health care provider. What should I know about heart disease and stroke? Heart disease, heart attack, and stroke become more likely as you age. This may be due, in part, to the hormonal changes that your body experiences during menopause. These can affect how your body processes dietary fats, triglycerides, and cholesterol. Heart attack and stroke are both medical emergencies. There are many things that you can do to help prevent heart disease  and stroke:  Have your blood pressure checked at least every 1-2 years. High blood pressure causes heart disease and increases the risk of stroke.  If you are 53-22 years old, ask your health care provider if you should take aspirin to prevent a heart attack or a stroke.  Do not use any tobacco products, including cigarettes, chewing tobacco, or electronic cigarettes. If you need help quitting, ask your health care provider.  It is important to eat a healthy diet and maintain a healthy weight. ? Be sure to include plenty of vegetables, fruits, low-fat dairy products, and lean protein. ? Avoid eating foods that are high in solid fats, added sugars, or salt (sodium).  Get regular exercise. This is one of the most important things that you can do for your health. ? Try to exercise for at least 150 minutes each week. The type of exercise that you do should increase your heart rate and make you sweat. This is known as moderate-intensity exercise. ? Try to do strengthening exercises at least twice each week. Do these in addition to the moderate-intensity exercise.  Know your numbers.Ask your health care provider to check your cholesterol and your blood glucose. Continue to have your blood tested as directed by your health care provider.  What should I know about cancer screening? There are several types of cancer. Take the following steps to reduce your risk and to catch any cancer development as early as possible. Breast Cancer  Practice breast self-awareness. ? This means understanding how your breasts normally appear and feel. ? It also means doing regular breast self-exams. Let your health care provider know about any changes, no matter how small.  If you are 40  or older, have a clinician do a breast exam (clinical breast exam or CBE) every year. Depending on your age, family history, and medical history, it may be recommended that you also have a yearly breast X-ray (mammogram).  If you  have a family history of breast cancer, talk with your health care provider about genetic screening.  If you are at high risk for breast cancer, talk with your health care provider about having an MRI and a mammogram every year.  Breast cancer (BRCA) gene test is recommended for women who have family members with BRCA-related cancers. Results of the assessment will determine the need for genetic counseling and BRCA1 and for BRCA2 testing. BRCA-related cancers include these types: ? Breast. This occurs in males or females. ? Ovarian. ? Tubal. This may also be called fallopian tube cancer. ? Cancer of the abdominal or pelvic lining (peritoneal cancer). ? Prostate. ? Pancreatic.  Cervical, Uterine, and Ovarian Cancer Your health care provider may recommend that you be screened regularly for cancer of the pelvic organs. These include your ovaries, uterus, and vagina. This screening involves a pelvic exam, which includes checking for microscopic changes to the surface of your cervix (Pap test).  For women ages 21-65, health care providers may recommend a pelvic exam and a Pap test every three years. For women ages 79-65, they may recommend the Pap test and pelvic exam, combined with testing for human papilloma virus (HPV), every five years. Some types of HPV increase your risk of cervical cancer. Testing for HPV may also be done on women of any age who have unclear Pap test results.  Other health care providers may not recommend any screening for nonpregnant women who are considered low risk for pelvic cancer and have no symptoms. Ask your health care provider if a screening pelvic exam is right for you.  If you have had past treatment for cervical cancer or a condition that could lead to cancer, you need Pap tests and screening for cancer for at least 20 years after your treatment. If Pap tests have been discontinued for you, your risk factors (such as having a new sexual partner) need to be  reassessed to determine if you should start having screenings again. Some women have medical problems that increase the chance of getting cervical cancer. In these cases, your health care provider may recommend that you have screening and Pap tests more often.  If you have a family history of uterine cancer or ovarian cancer, talk with your health care provider about genetic screening.  If you have vaginal bleeding after reaching menopause, tell your health care provider.  There are currently no reliable tests available to screen for ovarian cancer.  Lung Cancer Lung cancer screening is recommended for adults 69-62 years old who are at high risk for lung cancer because of a history of smoking. A yearly low-dose CT scan of the lungs is recommended if you:  Currently smoke.  Have a history of at least 30 pack-years of smoking and you currently smoke or have quit within the past 15 years. A pack-year is smoking an average of one pack of cigarettes per day for one year.  Yearly screening should:  Continue until it has been 15 years since you quit.  Stop if you develop a health problem that would prevent you from having lung cancer treatment.  Colorectal Cancer  This type of cancer can be detected and can often be prevented.  Routine colorectal cancer screening usually begins at  age 42 and continues through age 45.  If you have risk factors for colon cancer, your health care provider may recommend that you be screened at an earlier age.  If you have a family history of colorectal cancer, talk with your health care provider about genetic screening.  Your health care provider may also recommend using home test kits to check for hidden blood in your stool.  A small camera at the end of a tube can be used to examine your colon directly (sigmoidoscopy or colonoscopy). This is done to check for the earliest forms of colorectal cancer.  Direct examination of the colon should be repeated every  5-10 years until age 71. However, if early forms of precancerous polyps or small growths are found or if you have a family history or genetic risk for colorectal cancer, you may need to be screened more often.  Skin Cancer  Check your skin from head to toe regularly.  Monitor any moles. Be sure to tell your health care provider: ? About any new moles or changes in moles, especially if there is a change in a mole's shape or color. ? If you have a mole that is larger than the size of a pencil eraser.  If any of your family members has a history of skin cancer, especially at a young age, talk with your health care provider about genetic screening.  Always use sunscreen. Apply sunscreen liberally and repeatedly throughout the day.  Whenever you are outside, protect yourself by wearing long sleeves, pants, a wide-brimmed hat, and sunglasses.  What should I know about osteoporosis? Osteoporosis is a condition in which bone destruction happens more quickly than new bone creation. After menopause, you may be at an increased risk for osteoporosis. To help prevent osteoporosis or the bone fractures that can happen because of osteoporosis, the following is recommended:  If you are 46-71 years old, get at least 1,000 mg of calcium and at least 600 mg of vitamin D per day.  If you are older than age 55 but younger than age 65, get at least 1,200 mg of calcium and at least 600 mg of vitamin D per day.  If you are older than age 54, get at least 1,200 mg of calcium and at least 800 mg of vitamin D per day.  Smoking and excessive alcohol intake increase the risk of osteoporosis. Eat foods that are rich in calcium and vitamin D, and do weight-bearing exercises several times each week as directed by your health care provider. What should I know about how menopause affects my mental health? Depression may occur at any age, but it is more common as you become older. Common symptoms of depression  include:  Low or sad mood.  Changes in sleep patterns.  Changes in appetite or eating patterns.  Feeling an overall lack of motivation or enjoyment of activities that you previously enjoyed.  Frequent crying spells.  Talk with your health care provider if you think that you are experiencing depression. What should I know about immunizations? It is important that you get and maintain your immunizations. These include:  Tetanus, diphtheria, and pertussis (Tdap) booster vaccine.  Influenza every year before the flu season begins.  Pneumonia vaccine.  Shingles vaccine.  Your health care provider may also recommend other immunizations. This information is not intended to replace advice given to you by your health care provider. Make sure you discuss any questions you have with your health care provider. Document Released: 03/04/2005  Document Revised: 07/31/2015 Document Reviewed: 10/14/2014 Elsevier Interactive Patient Education  2018 [employer] Inc[employer]  

## 2017-08-28 NOTE — Addendum Note (Signed)
Addended by: Gwenyth Ober R on: 08/28/2017 03:43 PM   Modules accepted: Orders

## 2017-08-29 ENCOUNTER — Telehealth: Payer: Self-pay | Admitting: *Deleted

## 2017-08-29 DIAGNOSIS — R0609 Other forms of dyspnea: Principal | ICD-10-CM

## 2017-08-29 NOTE — Telephone Encounter (Signed)
Copied from [place] [id number]. Topic: General - Other >> Aug 29, 2017  1:49 PM Judyann Munson wrote: Reason for CRM:  patient is calling to request lab results. Please advise

## 2017-08-31 NOTE — Telephone Encounter (Signed)
Patient notified of results and verbalized understanding. Pt wants to know since labs were normal can the chest X-ray be placed?

## 2017-08-31 NOTE — Telephone Encounter (Signed)
Please call/notify patient that lab/test/procedure is normal Cholesterol 169

## 2017-08-31 NOTE — Progress Notes (Signed)
Will do. PK

## 2017-08-31 NOTE — Telephone Encounter (Signed)
Please result and advise 

## 2017-09-04 NOTE — Telephone Encounter (Signed)
Please schedule chest x-ray and 2D echocardiogram

## 2017-09-06 NOTE — Telephone Encounter (Signed)
Referrals placed 

## 2017-09-06 NOTE — Addendum Note (Signed)
Addended by: Gwenyth Ober R on: 09/06/2017 11:00 AM   Modules accepted: Orders

## 2017-09-07 ENCOUNTER — Ambulatory Visit (HOSPITAL_COMMUNITY): Payer: Medicare Other | Attending: Cardiology

## 2017-09-07 ENCOUNTER — Other Ambulatory Visit: Payer: Self-pay

## 2017-09-07 DIAGNOSIS — R0609 Other forms of dyspnea: Secondary | ICD-10-CM | POA: Diagnosis not present

## 2017-09-13 ENCOUNTER — Telehealth: Payer: Self-pay

## 2017-09-13 DIAGNOSIS — R0602 Shortness of breath: Secondary | ICD-10-CM

## 2017-09-13 NOTE — Telephone Encounter (Signed)
Called and left detailed message on answering machine

## 2017-09-13 NOTE — Telephone Encounter (Signed)
Copied from [place] [id number]. Topic: Quick Communication - Lab Results >> Sep 13, 2017 10:48 AM Marja Kays F wrote: Pt is looking for the results of her echo from Thursday  Best number [phone]

## 2017-09-13 NOTE — Telephone Encounter (Signed)
Patient stated he is still short of breath and if the provider thinks he needs a chest xray to go ahead and set it up.

## 2017-09-13 NOTE — Telephone Encounter (Signed)
Patient notified of results and verbalized understanding.  

## 2017-09-13 NOTE — Telephone Encounter (Signed)
Please advise 

## 2017-09-15 DIAGNOSIS — C50912 Malignant neoplasm of unspecified site of left female breast: Secondary | ICD-10-CM | POA: Diagnosis not present

## 2017-09-20 ENCOUNTER — Ambulatory Visit (INDEPENDENT_AMBULATORY_CARE_PROVIDER_SITE_OTHER): Payer: Medicare Other

## 2017-09-20 ENCOUNTER — Encounter: Payer: Self-pay | Admitting: Family Medicine

## 2017-09-20 ENCOUNTER — Ambulatory Visit: Payer: Medicare Other | Admitting: Family Medicine

## 2017-09-20 VITALS — BP 130/64 | HR 78 | Temp 98.1°F | Ht 66.75 in | Wt 203.1 lb

## 2017-09-20 DIAGNOSIS — M159 Polyosteoarthritis, unspecified: Secondary | ICD-10-CM

## 2017-09-20 DIAGNOSIS — R42 Dizziness and giddiness: Secondary | ICD-10-CM | POA: Diagnosis not present

## 2017-09-20 DIAGNOSIS — R0609 Other forms of dyspnea: Secondary | ICD-10-CM

## 2017-09-20 DIAGNOSIS — R0602 Shortness of breath: Secondary | ICD-10-CM | POA: Diagnosis not present

## 2017-09-20 DIAGNOSIS — M150 Primary generalized (osteo)arthritis: Secondary | ICD-10-CM | POA: Diagnosis not present

## 2017-09-20 MED ORDER — MELOXICAM 7.5 MG PO TABS: 7.5000 mg | ORAL_TABLET | Freq: Every day | ORAL | 1 refills | Status: DC

## 2017-09-20 NOTE — Progress Notes (Signed)
Selena [name] DOB: 10/16/1935 Encounter date: 09/20/2017  This isa 82 y.o. female who presents to establish care. Chief Complaint  Patient presents with  . Transitions Of Care    insurance is not covering antivert, pt saw dr.k for SOB, he had reccommened an xray to the pt    History of present illness: Atrial Fibrillation: RFA 2009; no recurrence. Follicular lymphoma grade I Breast cancer upper outer quad R breast: Completed chemo 2 yrs ago; followed by oncology.  Vertigo- intermittent, chronic. Takes the meclizine but insurance is not covering anymore. Has been evaluated in past; told inner ear. Takes zofran if nausea is really bad with it. Saw neurology in past.   Fatigue: Has felt more fatigued lately. Feeling worn out much more quickly.  Memory loss:sometimes has difficulty remembering what she did or calling people by wrong names. Seems stable.  Anxiety: Occasional. Doesn't bother her too much overall.   Insomnia: Takes benadryl and advil at bedtime which helps with sleep.  Arthritis: hx of bilat TKA; was having thumb pain. Diclofenac helps; going to try meloxicam when that rx runs out.   Complaint of dyspnea on exertion last visit:  ECHO completed 09/07/17: increased LV wall thickness consistent with LVH; grade 2 diastolic dxfunction; EF was 55-65%, mild atrial dilation.  Xray ordered but does not appear to have been completed. She wanted to complete all in one day. Can not even get up and down driveway. No chest pain/pressure. Stays sweaty with hot weather but not noting particular sweating with exertion. Usually can go up and down driveway about 4 times. Has been going on 2-3 months. Not worse since here last time. Not tight in chest. Maybe wheezy. Just hard to get air in and out. Feels back to normal with 5 minutes of rest.   Past Medical History:  Diagnosis Date  . Anemia    PMH  . Atrial fibrillation ([place]) 01/01/2007  . BACK PAIN, UPPER 07/09/2008  . Breast cancer, stage  1 ([place])   . Bruises easily   . Cataract    history  . Complication of anesthesia   . Diverticulitis   . DIVERTICULOSIS, COLON 12/26/2006  . Follicular non-Hodgkin's lymphoma ([place])   . Goiter    multi-nodular  . Hx of colonoscopy 2009  . LIVER FUNCTION TESTS, ABNORMAL, HX OF 12/26/2007  . Memory loss 05/16/2007  . OSTEOARTHRITIS 12/26/2006   takes Diclofenac daily but has stopped for surgery  . PONV (postoperative nausea and vomiting)   . Skin cancer   . VERTIGO 09/07/2009  . Wears dentures   . Wears glasses   . Wears hearing aid    bilateral   Past Surgical History:  Procedure Laterality Date  . ABDOMINAL HYSTERECTOMY    . ABLATION OF DYSRHYTHMIC FOCUS  2009  . APPENDECTOMY    . AUGMENTATION MAMMAPLASTY Right 2013  . BREAST BIOPSY  2013  . BREAST RECONSTRUCTION  04/14/2011   Procedure: BREAST RECONSTRUCTION;  Surgeon: Crissie Reese, MD;  Location: [facility];  Service: Plastics;  Laterality: Right;  Placement of Right Breast Tissue Expander with  use of Flex HD for Breast Reconstruction  . BREAST SURGERY     right sm, snbx  . CATARACT EXTRACTION W/ INTRAOCULAR LENS  IMPLANT, BILATERAL  2010   bilateral  . COLONOSCOPY    . COLONOSCOPY    . KNEE SURGERY  2004/2010   arthroscopic/ bilateral knee replacements  . MASTECTOMY Right 2013  . PORT-A-CATH REMOVAL N/A 12/07/2016   Procedure: REMOVAL  PORT-A-CATH;  Surgeon: Rolm Bookbinder, MD;  Location: [facility];  Service: General;  Laterality: N/A;  . PORTACATH PLACEMENT Right 08/11/2014   Procedure: INSERTION PORT-A-CATH WITH ULTRASOUND;  Surgeon: Rolm Bookbinder, MD;  Location: [facility];  Service: General;  Laterality: Right;  . [employer]   right  . tmi  1998  . TONSILLECTOMY     Allergies  Allergen Reactions  . Codeine Phosphate Other (See Comments)    Hallucinations  . Codeine Other (See Comments)    Hallucinations    Current Meds  Medication Sig  . cetirizine (ZYRTEC) 10 MG tablet Take 10 mg by mouth daily.  .  meclizine (ANTIVERT) 25 MG tablet Take 1 tablet (25 mg total) by mouth every 6 (six) hours as needed for dizziness.  . meloxicam (MOBIC) 7.5 MG tablet Take 1 tablet (7.5 mg total) by mouth daily.  . ondansetron (ZOFRAN) 4 MG tablet Take 1 tablet (4 mg total) by mouth every 8 (eight) hours as needed for nausea or vomiting.  . [DISCONTINUED] meloxicam (MOBIC) 7.5 MG tablet Take 1 tablet (7.5 mg total) by mouth daily.   Social History   Tobacco Use  . Smoking status: Former Research scientist (life sciences)  . Smokeless tobacco: Never Used  . Tobacco comment: stopped 20-30 yers ago  Substance Use Topics  . Alcohol use: No   Family History  Problem Relation Age of Onset  . Colon cancer Mother   . Cancer Mother        colon  . Colon cancer Maternal Aunt   . Cancer Maternal Uncle   . Prostate cancer Maternal Uncle   . Prostate cancer Maternal Uncle   . Other Maternal Uncle   . Cancer Sister        kidney  . Cancer Brother        lymph node  . Anesthesia problems Neg Hx   . Hypotension Neg Hx   . Malignant hyperthermia Neg Hx   . Pseudochol deficiency Neg Hx   . Breast cancer Neg Hx      Review of Systems  Constitutional: Negative for chills, fatigue and fever.  HENT: Negative for congestion.   Respiratory: Positive for shortness of breath (see hpi). Negative for cough, chest tightness and wheezing.   Cardiovascular: Negative for chest pain, palpitations and leg swelling.  Musculoskeletal: Positive for arthralgias.  Neurological: Positive for dizziness (intermittent vertigo). Negative for light-headedness.  Psychiatric/Behavioral: Positive for sleep disturbance.    Objective:  BP 130/64 (BP Location: Left Arm, Patient Position: Sitting, Cuff Size: Normal)   Pulse 78   Temp 98.1 F (36.7 C) (Oral)   Ht 5' 6.75" (1.695 m)   Wt 203 lb 1.6 oz (92.1 kg)   SpO2 97%   BMI 32.05 kg/m   Weight: 203 lb 1.6 oz (92.1 kg)   BP Readings from Last 3 Encounters:  09/20/17 130/64  08/28/17 (!) 150/60   06/05/17 (!) 167/74   Wt Readings from Last 3 Encounters:  09/20/17 203 lb 1.6 oz (92.1 kg)  08/28/17 201 lb 6.4 oz (91.4 kg)  06/05/17 206 lb 12.8 oz (93.8 kg)    Physical Exam  Constitutional: She appears well-developed and well-nourished. No distress.  Cardiovascular: Normal rate, regular rhythm and normal heart sounds. Exam reveals no friction rub.  No murmur heard. Trace bilat LE edema  No carotid bruit appreciated  Pulmonary/Chest: Effort normal. No accessory muscle usage. No tachypnea. No respiratory distress. She has no wheezes. She has no rhonchi. She has  rales (minimal bases R>L).  Abdominal: Soft. Bowel sounds are normal. She exhibits no distension and no mass. There is no tenderness.  Psychiatric: She has a normal mood and affect. Her speech is normal and behavior is normal.    Assessment/Plan: 1. Dyspnea on exertion EKG in office today appears stable from previous. Since this is newer symptom for her it does require additional work up. She will complete CXR today and we will follow up with further eval pending these results.  - EKG 12-Lead - DG Chest 2 View; Future  2. Primary osteoarthritis involving multiple joints She is still taking diclofenac but will try mobic once this runs out. Likes idea of once daily medication and just needed rx reprinted. - meloxicam (MOBIC) 7.5 MG tablet; Take 1 tablet (7.5 mg total) by mouth daily.  Dispense: 90 tablet; Refill: 1  3. VERTIGO Meclizine not covered and expensive for her. Insurance suggested compazine as alternative but I worry about side effects of that medication, so I have asked her to try dramamine otc and monitor. She has seen neurology in past for evaluation of this. She was set up to see ENT but decided against visit.    Return for pending chest xray.  Micheline Rough, MD

## 2017-09-20 NOTE — Patient Instructions (Addendum)
Trial of dramamine OTC for dizziness (can also ask pharmacist if there is generic meclizine).

## 2017-09-21 ENCOUNTER — Encounter: Payer: Self-pay | Admitting: Family Medicine

## 2017-09-22 ENCOUNTER — Other Ambulatory Visit: Payer: Self-pay

## 2017-09-22 ENCOUNTER — Other Ambulatory Visit: Payer: Self-pay | Admitting: Family Medicine

## 2017-09-22 ENCOUNTER — Emergency Department (HOSPITAL_COMMUNITY): Payer: Medicare Other

## 2017-09-22 ENCOUNTER — Emergency Department (HOSPITAL_COMMUNITY)
Admission: EM | Admit: 2017-09-22 | Discharge: 2017-09-22 | Disposition: A | Discharge status: Home / Self Care | Payer: Medicare Other | Attending: Emergency Medicine | Admitting: Emergency Medicine

## 2017-09-22 ENCOUNTER — Encounter (HOSPITAL_COMMUNITY): Payer: Self-pay | Admitting: Emergency Medicine

## 2017-09-22 ENCOUNTER — Ambulatory Visit (INDEPENDENT_AMBULATORY_CARE_PROVIDER_SITE_OTHER): Payer: Medicare Other | Admitting: Family Medicine

## 2017-09-22 DIAGNOSIS — Z87891 Personal history of nicotine dependence: Secondary | ICD-10-CM | POA: Insufficient documentation

## 2017-09-22 DIAGNOSIS — Z8572 Personal history of non-Hodgkin lymphomas: Secondary | ICD-10-CM | POA: Insufficient documentation

## 2017-09-22 DIAGNOSIS — R0609 Other forms of dyspnea: Principal | ICD-10-CM

## 2017-09-22 DIAGNOSIS — I712 Thoracic aortic aneurysm, without rupture: Secondary | ICD-10-CM | POA: Insufficient documentation

## 2017-09-22 DIAGNOSIS — Z853 Personal history of malignant neoplasm of breast: Secondary | ICD-10-CM | POA: Insufficient documentation

## 2017-09-22 DIAGNOSIS — R0602 Shortness of breath: Secondary | ICD-10-CM | POA: Diagnosis not present

## 2017-09-22 DIAGNOSIS — I7121 Aneurysm of the ascending aorta, without rupture: Secondary | ICD-10-CM

## 2017-09-22 DIAGNOSIS — Z9011 Acquired absence of right breast and nipple: Secondary | ICD-10-CM | POA: Insufficient documentation

## 2017-09-22 DIAGNOSIS — E0590 Thyrotoxicosis, unspecified without thyrotoxic crisis or storm: Secondary | ICD-10-CM | POA: Diagnosis not present

## 2017-09-22 LAB — BASIC METABOLIC PANEL
Anion gap: 10 (ref 5–15)
BUN: 16 mg/dL (ref 8–23)
CALCIUM: 9.3 mg/dL (ref 8.9–10.3)
CO2: 25 mmol/L (ref 22–32)
CREATININE: 0.94 mg/dL (ref 0.44–1.00)
Chloride: 107 mmol/L (ref 98–111)
GFR calc non Af Amer: 56 mL/min — ABNORMAL LOW (ref >60)
Glucose, Bld: 94 mg/dL (ref 70–99)
Potassium: 4.1 mmol/L (ref 3.5–5.1)
SODIUM: 142 mmol/L (ref 135–145)

## 2017-09-22 LAB — CBC WITH DIFFERENTIAL/PLATELET
Abs Immature Granulocytes: 0 10*3/uL (ref 0.0–0.1)
BASOS ABS: 0.1 10*3/uL (ref 0.0–0.1)
Basophils Relative: 1 %
Eosinophils Absolute: 0.4 10*3/uL (ref 0.0–0.7)
Eosinophils Relative: 5 %
HCT: 43.8 % (ref 36.0–46.0)
Hemoglobin: 14.8 g/dL (ref 12.0–15.0)
Immature Granulocytes: 1 %
LYMPHS PCT: 19 %
Lymphs Abs: 1.5 10*3/uL (ref 0.7–4.0)
MCH: 33.6 pg (ref 26.0–34.0)
MCHC: 33.8 g/dL (ref 30.0–36.0)
MCV: 99.3 fL (ref 78.0–100.0)
MONO ABS: 0.9 10*3/uL (ref 0.1–1.0)
Monocytes Relative: 11 %
Neutro Abs: 4.8 10*3/uL (ref 1.7–7.7)
Neutrophils Relative %: 63 %
PLATELETS: 161 10*3/uL (ref 150–400)
RBC: 4.41 MIL/uL (ref 3.87–5.11)
RDW: 13 % (ref 11.5–15.5)
WBC: 7.6 10*3/uL (ref 4.0–10.5)

## 2017-09-22 LAB — D-DIMER, QUANTITATIVE (NOT AT ARMC): D DIMER QUANT: 0.73 ug{FEU}/mL — AB (ref <0.50)

## 2017-09-22 LAB — I-STAT TROPONIN, ED: TROPONIN I, POC: 0.01 ng/mL (ref 0.00–0.08)

## 2017-09-22 LAB — T3, FREE: T3, Free: 4.6 pg/mL — ABNORMAL HIGH (ref 2.3–4.2)

## 2017-09-22 LAB — T4, FREE: FREE T4: 0.86 ng/dL (ref 0.60–1.60)

## 2017-09-22 LAB — BRAIN NATRIURETIC PEPTIDE: Pro B Natriuretic peptide (BNP): 209 pg/mL — ABNORMAL HIGH (ref 0.0–100.0)

## 2017-09-22 MED ORDER — IOPAMIDOL (ISOVUE-370) INJECTION 76%
INTRAVENOUS | Status: AC
  Filled 2017-09-22: qty 100

## 2017-09-22 MED ORDER — ALBUTEROL SULFATE HFA 108 (90 BASE) MCG/ACT IN AERS
2.0000 | INHALATION_SPRAY | Freq: Once | RESPIRATORY_TRACT | Status: AC
  Administered 2017-09-22: 2 via RESPIRATORY_TRACT
  Filled 2017-09-22: qty 6.7

## 2017-09-22 MED ORDER — IOPAMIDOL (ISOVUE-370) INJECTION 76%
100.0000 mL | Freq: Once | INTRAVENOUS | Status: AC | PRN
  Administered 2017-09-22: 100 mL via INTRAVENOUS

## 2017-09-22 NOTE — ED Notes (Signed)
Reported elevated pressure to Dr Laverta Baltimore

## 2017-09-22 NOTE — ED Provider Notes (Signed)
Patient placed in Quick Look pathway, seen and evaluated   Chief Complaint: shortness of breath  HPI:   Selena [name] is a 82 y.o. female who presents to the ED for shortness of breath that started 2 months ago and has gotten progressively worse. Symptoms are worse with exertion. Patient went to see her PCP today and had a CXR and was told possible PE and sent to the ED for further evaluation. Patient also reports dizziness.   ROS: Neuro: dizzy  Resp: shortness of breath.  Physical Exam:  BP (!) 174/75 (BP Location: Right Arm)   Pulse 81   Temp 97.9 F (36.6 C) (Oral)   Resp 18   SpO2 96%    Gen: No distress  Neuro: Awake and Alert  Skin: Warm and dry  Lungs: rales RLL   Initiation of care has begun. The patient has been counseled on the process, plan, and necessity for staying for the completion/evaluation, and the remainder of the medical screening examination    Ashley Murrain, NP 09/22/17 [id number]    Jola Schmidt, MD 09/23/17 [phone]

## 2017-09-22 NOTE — Discharge Instructions (Signed)
You were seen in the ED today with trouble breathing. Use the albuterol provided for trouble breathing. Use as directed. Call your PCP on Tuesday to continue your outpatient workup. Your CT scan of the chest did show an aneurysm and we recommend monitoring at least once per year to make sure this is not getting any larger. Discuss this with your PCP.

## 2017-09-22 NOTE — ED Triage Notes (Signed)
Pt reports SHOB x 2 months, progressively worsening. Pt reports dyspnea on exertion. Pt seen at PCP and told to come to ER for possible blood clot. Pt denies CP. Pt reports dizziness.

## 2017-09-22 NOTE — Progress Notes (Signed)
Patient came in today for a nurse visit. We walked around office X 2, starting O2 98% HR 73, during walk the O2 went down to 96%. At end of walking the O2 was back to 98% and HR was 93. Dr. Ethlyn Gallery came in to treatment room and spoke with pt. I did walk pt over to the lab department for lab draw.

## 2017-09-22 NOTE — ED Provider Notes (Signed)
Emergency Department Provider Note   I have reviewed the triage vital signs and the nursing notes.   HISTORY  Chief Complaint Shortness of Breath   HPI Selena [name] is a 82 y.o. female with PMH of breast cancer and anemia presents to the ED with shortness of breath worsening over the past 2 months.  The patient went to her primary care physician's office who performed a chest x-ray 2 days ago which showed pulmonary fibrosis.  She returns the office today for blood work and ambulation with pulse ox in the office.  D-dimer was elevated and the patient was sent to the chest to rule out PE.  Patient denies any leg pain or swelling. No fever or chills. No productive cough.   Past Medical History:  Diagnosis Date  . Anemia    PMH  . Atrial fibrillation ([place]) 01/01/2007  . BACK PAIN, UPPER 07/09/2008  . Breast cancer, stage 1 ([place])   . Bruises easily   . Cataract    history  . Complication of anesthesia   . Diverticulitis   . DIVERTICULOSIS, COLON 12/26/2006  . Follicular non-Hodgkin's lymphoma ([place])   . Goiter    multi-nodular  . Hx of colonoscopy 2009  . LIVER FUNCTION TESTS, ABNORMAL, HX OF 12/26/2007  . Memory loss 05/16/2007  . OSTEOARTHRITIS 12/26/2006   takes Diclofenac daily but has stopped for surgery  . PONV (postoperative nausea and vomiting)   . Skin cancer   . VERTIGO 09/07/2009  . Wears dentures   . Wears glasses   . Wears hearing aid    bilateral    Patient Active Problem List   Diagnosis Date Noted  . Anxiety 09/10/2014  . Insomnia 09/10/2014  . Follicular 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 ([place]) 07/31/2014  . Paraspinal mass 07/16/2014  . Breast cancer of upper-outer quadrant of right female breast ([place]) 03/21/2011  . VERTIGO 09/07/2009  . Backache 07/09/2008  . FATIGUE 03/18/2008  . LFTs abnormal 12/26/2007  . MEMORY LOSS 05/16/2007  . ATRIAL FIBRILLATION 01/01/2007  . DIVERTICULOSIS, COLON 12/26/2006  . Osteoarthritis 12/26/2006     Past Surgical History:  Procedure Laterality Date  . ABDOMINAL HYSTERECTOMY    . ABLATION OF DYSRHYTHMIC FOCUS  2009  . APPENDECTOMY    . AUGMENTATION MAMMAPLASTY Right 2013  . BREAST BIOPSY  2013  . BREAST RECONSTRUCTION  04/14/2011   Procedure: BREAST RECONSTRUCTION;  Surgeon: Crissie Reese, MD;  Location: [facility];  Service: Plastics;  Laterality: Right;  Placement of Right Breast Tissue Expander with  use of Flex HD for Breast Reconstruction  . BREAST SURGERY     right sm, snbx  . CATARACT EXTRACTION W/ INTRAOCULAR LENS  IMPLANT, BILATERAL  2010   bilateral  . COLONOSCOPY    . COLONOSCOPY    . KNEE SURGERY  2004/2010   arthroscopic/ bilateral knee replacements  . MASTECTOMY Right 2013  . PORT-A-CATH REMOVAL N/A 12/07/2016   Procedure: REMOVAL PORT-A-CATH;  Surgeon: Rolm Bookbinder, MD;  Location: [facility];  Service: General;  Laterality: N/A;  . PORTACATH PLACEMENT Right 08/11/2014   Procedure: INSERTION PORT-A-CATH WITH ULTRASOUND;  Surgeon: Rolm Bookbinder, MD;  Location: [facility];  Service: General;  Laterality: Right;  . [place]   right  . tmi  1998  . TONSILLECTOMY      Allergies Codeine phosphate and Codeine  Family History  Problem Relation Age of Onset  . Colon cancer Mother   . Cancer Mother  colon  . Colon cancer Maternal Aunt   . Cancer Maternal Uncle   . Prostate cancer Maternal Uncle   . Prostate cancer Maternal Uncle   . Other Maternal Uncle   . Cancer Sister        kidney  . Cancer Brother        lymph node  . Anesthesia problems Neg Hx   . Hypotension Neg Hx   . Malignant hyperthermia Neg Hx   . Pseudochol deficiency Neg Hx   . Breast cancer Neg Hx     Social History Social History   Tobacco Use  . Smoking status: Former Research scientist (life sciences)  . Smokeless tobacco: Never Used  . Tobacco comment: stopped 20-30 yers ago  Substance Use Topics  . Alcohol use: No  . Drug use: No    Review of Systems  Constitutional: No  fever/chills Eyes: No visual changes. ENT: No sore throat. Cardiovascular: Denies chest pain. Respiratory: Positive shortness of breath. Gastrointestinal: No abdominal pain.  No nausea, no vomiting.  No diarrhea.  No constipation. Genitourinary: Negative for dysuria. Musculoskeletal: Negative for back pain. Skin: Negative for rash. Neurological: Negative for headaches, focal weakness or numbness.  10-point ROS otherwise negative.  ____________________________________________   PHYSICAL EXAM:  VITAL SIGNS: ED Triage Vitals  Enc Vitals Group     BP 09/22/17 1723 (!) 174/75     Pulse Rate 09/22/17 1723 81     Resp 09/22/17 1723 18     Temp 09/22/17 1723 97.9 F (36.6 C)     Temp Source 09/22/17 1723 Oral     SpO2 09/22/17 1723 96 %     Pain Score 09/22/17 1737 0   Constitutional: Alert and oriented. Well appearing and in no acute distress. Eyes: Conjunctivae are normal.  Head: Atraumatic. Nose: No congestion/rhinnorhea. Mouth/Throat: Mucous membranes are moist.  Neck: No stridor.  Cardiovascular: Normal rate, regular rhythm. Good peripheral circulation. Grossly normal heart sounds.   Respiratory: Normal respiratory effort.  No retractions. Lungs CTAB. Gastrointestinal: Soft and nontender. No distention.  Musculoskeletal: No lower extremity tenderness nor edema. No gross deformities of extremities. Neurologic:  Normal speech and language. No gross focal neurologic deficits are appreciated.  Skin:  Skin is warm, dry and intact. No rash noted.  ____________________________________________   LABS (all labs ordered are listed, but only abnormal results are displayed)  Labs Reviewed  BASIC METABOLIC PANEL - Abnormal; Notable for the following components:      Result Value   GFR calc non Af Amer 56 (*)    All other components within normal limits  CBC WITH DIFFERENTIAL/PLATELET  I-STAT TROPONIN, ED   ____________________________________________  EKG   EKG  Interpretation  Date/Time:  Friday September 22 2017 17:26:32 EDT Ventricular Rate:  74 PR Interval:  206 QRS Duration: 84 QT Interval:  398 QTC Calculation: 441 R Axis:   54 Text Interpretation:  Normal sinus rhythm Normal ECG No STEMI.  Confirmed by Nanda Quinton [phone]) on 09/22/2017 10:15:11 PM       ____________________________________________  RADIOLOGY  Ct Angio Chest Pe W And/or Wo Contrast  Result Date: 09/22/2017 CLINICAL DATA:  Short of breath EXAM: CT ANGIOGRAPHY CHEST WITH CONTRAST TECHNIQUE: Multidetector CT imaging of the chest was performed using the standard protocol during bolus administration of intravenous contrast. Multiplanar CT image reconstructions and MIPs were obtained to evaluate the vascular anatomy. CONTRAST:  134mL ISOVUE-370 IOPAMIDOL (ISOVUE-370) INJECTION 76% COMPARISON:  Chest two-view 09/20/2017 FINDINGS: Cardiovascular: Negative for pulmonary embolism. Pulmonary arteries normal  in caliber. Ascending aortic aneurysm 4.2 cm. Negative for dissection. Mild atherosclerotic disease in the aortic arch and coronary arteries. Mediastinum/Nodes: Negative for mass or adenopathy. Lungs/Pleura: Negative for infiltrate or effusion. Negative for lung mass Upper Abdomen: Negative Musculoskeletal: Right mastectomy with reconstruction. No acute skeletal abnormality. Review of the MIP images confirms the above findings. IMPRESSION: Negative for pulmonary embolism.  No acute abnormality in the chest Ascending aortic aneurysm 4.2 cm. Recommend annual imaging followup by CTA or MRA. This recommendation follows 2010 ACCF/AHA/AATS/ACR/ASA/SCA/SCAI/SIR/STS/SVM Guidelines for the Diagnosis and Management of Patients with Thoracic Aortic Disease. Circulation. 2010; 121: Z858-I502 Aortic Atherosclerosis (ICD10-I70.0). Electronically Signed   By: Franchot Gallo M.D.   On: 09/22/2017 20:10    ____________________________________________   PROCEDURES  Procedure(s) performed:    Procedures  None ____________________________________________   INITIAL IMPRESSION / ASSESSMENT AND PLAN / ED COURSE  Pertinent labs & imaging results that were available during my care of the patient were reviewed by me and considered in my medical decision making (see chart for details).  Patient presents to the emergency department with shortness of breath.  Plain film as an outpatient shows pulmonary fibrosis thought to be secondary to history of chemotherapy.  No chest pain.  Labs from triage reviewed with no acute findings including troponin.  CT angios shows no pulmonary embolism.  The CT does show an ascending aortic aneurysm 4.2 cm.  Discussed this incidental diagnosis with the patient and family at bedside.  Recommend annual follow-up imaging and discussion with her PCP.  Patient feeling slightly better after albuterol inhaler given here.  Will discharge home with that and have her follow with the PCP for possible pulmonology referral.   At this time, I do not feel there is any life-threatening condition present. I have reviewed and discussed all results (EKG, imaging, lab, urine as appropriate), exam findings with patient. I have reviewed nursing notes and appropriate previous records.  I feel the patient is safe to be discharged home without further emergent workup. Discussed usual and customary return precautions. Patient and family (if present) verbalize understanding and are comfortable with this plan.  Patient will follow-up with their primary care provider. If they do not have a primary care provider, information for follow-up has been provided to them. All questions have been answered.  ____________________________________________  FINAL CLINICAL IMPRESSION(S) / ED DIAGNOSES  Final diagnoses:  Shortness of breath  Ascending aortic aneurysm ([name])     MEDICATIONS GIVEN DURING THIS VISIT:  Medications  iopamidol (ISOVUE-370) 76 % injection (has no administration in time  range)  iopamidol (ISOVUE-370) 76 % injection 100 mL (100 mLs Intravenous Contrast Given 09/22/17 1934)  albuterol (PROVENTIL HFA;VENTOLIN HFA) 108 (90 Base) MCG/ACT inhaler 2 puff (2 puffs Inhalation Given 09/22/17 2235)    Note:  This document was prepared using Dragon voice recognition software and may include unintentional dictation errors.  Nanda Quinton, MD Emergency Medicine    , [facility], MD 09/22/17 [phone]

## 2017-09-26 ENCOUNTER — Encounter: Payer: Self-pay | Admitting: Family Medicine

## 2017-09-26 ENCOUNTER — Ambulatory Visit: Payer: Medicare Other | Admitting: Family Medicine

## 2017-09-26 ENCOUNTER — Telehealth: Payer: Self-pay

## 2017-09-26 VITALS — BP 120/60 | HR 71 | Temp 98.2°F | Ht 66.75 in | Wt 203.6 lb

## 2017-09-26 DIAGNOSIS — R21 Rash and other nonspecific skin eruption: Secondary | ICD-10-CM | POA: Diagnosis not present

## 2017-09-26 DIAGNOSIS — R0602 Shortness of breath: Secondary | ICD-10-CM

## 2017-09-26 MED ORDER — TRIAMCINOLONE ACETONIDE 0.1 % EX CREA: 1.0000 "application " | TOPICAL_CREAM | Freq: Two times a day (BID) | CUTANEOUS | 0 refills | Status: DC

## 2017-09-26 NOTE — Telephone Encounter (Signed)
Called and spoke to patient, she stated that she is still have some SOB and that the inhaler she was given at the hospital does not seem to be helping. I discussed the options that Dr. Ethlyn Gallery gave and the patient stated that she would prefer that we place the referral. She also has a hospital follow up appointment tomorrow at 1:00 with Dr. Ethlyn Gallery.   Referral has been place.

## 2017-09-26 NOTE — Addendum Note (Signed)
Addended by: Rebecca Eaton on: 09/26/2017 01:08 PM   Modules accepted: Orders

## 2017-09-26 NOTE — Telephone Encounter (Signed)
Caren Macadam, MD  Rebecca Eaton, CMA        Please start telephone template:   I was CC'd on ER visit. Please tell Malvika thank you for going to ER. They ruled out a PE which is what I wanted. They suggested pulmonology referral which I think is very reasonable. Please give her option of seeing pulmonology and/or following up with pulmonary function testing which we can order to be done at the hospital. Those may help guide the pulmonologist and [place] as to what would be most helpful for her regarding breathing. I am happy with either/both; please order. Thanks!

## 2017-09-26 NOTE — Patient Instructions (Signed)
Follow up with your doct as planned for other issues.  Topical steroid (triamcinilone cream) to the electrode patch rashes 1-2 times daily for 1-2 weeks.  Aquaphor to protect (available in baby section of drug or grocery store)  Yeast cream (clotrimazole or monistat) under breast crease 1-2 times daily for several week.  I hope you are feeling better soon! Seek care promptly if your symptoms worsen, new concerns arise or you are not improving with treatment.

## 2017-09-26 NOTE — Progress Notes (Signed)
HPI:  Using dictation device. Unfortunately this device frequently misinterprets words/phrases.  Acute visit for Rash: -started after having EKG -very itchy - she has been putting neosporin on it and used some benadryl -no fevers, rash elsewhere, hx of reaction to Band-Aid or adhesive prior - but reports does not usually use bandaides  ROS: See pertinent positives and negatives per HPI.  Past Medical History:  Diagnosis Date  . Anemia    PMH  . Atrial fibrillation ([place]) 01/01/2007  . BACK PAIN, UPPER 07/09/2008  . Breast cancer, stage 1 ([place])   . Bruises easily   . Cataract    history  . Complication of anesthesia   . Diverticulitis   . DIVERTICULOSIS, COLON 12/26/2006  . Follicular non-Hodgkin's lymphoma ([place])   . Goiter    multi-nodular  . Hx of colonoscopy 2009  . LIVER FUNCTION TESTS, ABNORMAL, HX OF 12/26/2007  . Memory loss 05/16/2007  . OSTEOARTHRITIS 12/26/2006   takes Diclofenac daily but has stopped for surgery  . PONV (postoperative nausea and vomiting)   . Skin cancer   . VERTIGO 09/07/2009  . Wears dentures   . Wears glasses   . Wears hearing aid    bilateral    Past Surgical History:  Procedure Laterality Date  . ABDOMINAL HYSTERECTOMY    . ABLATION OF DYSRHYTHMIC FOCUS  2009  . APPENDECTOMY    . AUGMENTATION MAMMAPLASTY Right 2013  . BREAST BIOPSY  2013  . BREAST RECONSTRUCTION  04/14/2011   Procedure: BREAST RECONSTRUCTION;  Surgeon: Crissie Reese, MD;  Location: [facility];  Service: Plastics;  Laterality: Right;  Placement of Right Breast Tissue Expander with  use of Flex HD for Breast Reconstruction  . BREAST SURGERY     right sm, snbx  . CATARACT EXTRACTION W/ INTRAOCULAR LENS  IMPLANT, BILATERAL  2010   bilateral  . COLONOSCOPY    . COLONOSCOPY    . KNEE SURGERY  2004/2010   arthroscopic/ bilateral knee replacements  . MASTECTOMY Right 2013  . PORT-A-CATH REMOVAL N/A 12/07/2016   Procedure: REMOVAL PORT-A-CATH;  Surgeon: Rolm Bookbinder, MD;   Location: [facility];  Service: General;  Laterality: N/A;  . PORTACATH PLACEMENT Right 08/11/2014   Procedure: INSERTION PORT-A-CATH WITH ULTRASOUND;  Surgeon: Rolm Bookbinder, MD;  Location: [facility];  Service: General;  Laterality: Right;  . [place]   right  . tmi  1998  . TONSILLECTOMY      Family History  Problem Relation Age of Onset  . Colon cancer Mother   . Cancer Mother        colon  . Colon cancer Maternal Aunt   . Cancer Maternal Uncle   . Prostate cancer Maternal Uncle   . Prostate cancer Maternal Uncle   . Other Maternal Uncle   . Cancer Sister        kidney  . Cancer Brother        lymph node  . Anesthesia problems Neg Hx   . Hypotension Neg Hx   . Malignant hyperthermia Neg Hx   . Pseudochol deficiency Neg Hx   . Breast cancer Neg Hx     SOCIAL HX: see hpi   Current Outpatient Medications:  .  aspirin EC 81 MG tablet, Take 81 mg by mouth daily., Disp: , Rfl:  .  meclizine (ANTIVERT) 25 MG tablet, Take 1 tablet (25 mg total) by mouth every 6 (six) hours as needed for dizziness., Disp: 60 tablet, Rfl: 4 .  meloxicam (MOBIC) 7.5 MG tablet, Take 1 tablet (7.5 mg total) by mouth daily., Disp: 90 tablet, Rfl: 1 .  ondansetron (ZOFRAN) 4 MG tablet, Take 1 tablet (4 mg total) by mouth every 8 (eight) hours as needed for nausea or vomiting., Disp: 20 tablet, Rfl: 0 .  triamcinolone cream (KENALOG) 0.1 %, Apply 1 application topically 2 (two) times daily., Disp: 30 g, Rfl: 0  EXAM:  Vitals:   09/26/17 1021  BP: 120/60  Pulse: 71  Temp: 98.2 F (36.8 C)  SpO2: 97%    Body mass index is 32.13 kg/m.  GENERAL: vitals reviewed and listed above, alert, oriented, appears well hydrated and in no acute distress  HEENT: atraumatic, conjunttiva clear, no obvious abnormalities on inspection of external nose and ears  NECK: no obvious masses on inspection  SKIN: electrode shaped circular areas of erythematous papules in distribution of EKG electrodes on  chest, mild demarcated erythema in both inframammilary folds   MS: moves all extremities without noticeable abnormality  PSYCH: pleasant and cooperative, no obvious depression or anxiety  ASSESSMENT AND PLAN:  Discussed the following assessment and plan:  Rash  -appears to have 2 issues, mild yeast dermatitis under breasts and contact dermatitis from EKG pad adhesive -opted for OTC topical yeast treatment, mod dose steroid cream and Aquaphor for contact dermatitis, as needed antihistamine per instructions -she reports she will see her PCP tomorrow for follow up from the hospital visit  -Patient advised to return or notify a doctor immediately if sooner or at any point if symptoms worsen,new concerns arise or these symptoms persist despite treatment  Patient Instructions  Follow up with your doct as planned for other issues.  Topical steroid (triamcinilone cream) to the electrode patch rashes 1-2 times daily for 1-2 weeks.  Aquaphor to protect (available in baby section of drug or grocery store)  Yeast cream (clotrimazole or monistat) under breast crease 1-2 times daily for several week.  I hope you are feeling better soon! Seek care promptly if your symptoms worsen, new concerns arise or you are not improving with treatment.     Lucretia Kern, DO

## 2017-09-26 NOTE — Telephone Encounter (Signed)
Left message for patient to call back. CRM created 

## 2017-09-27 ENCOUNTER — Encounter: Payer: Self-pay | Admitting: Family Medicine

## 2017-09-27 ENCOUNTER — Ambulatory Visit: Payer: Medicare Other | Admitting: Family Medicine

## 2017-09-27 VITALS — BP 144/64 | HR 76 | Temp 98.0°F | Wt 203.1 lb

## 2017-09-27 DIAGNOSIS — I712 Thoracic aortic aneurysm, without rupture, unspecified: Secondary | ICD-10-CM

## 2017-09-27 DIAGNOSIS — E0590 Thyrotoxicosis, unspecified without thyrotoxic crisis or storm: Secondary | ICD-10-CM | POA: Diagnosis not present

## 2017-09-27 DIAGNOSIS — R030 Elevated blood-pressure reading, without diagnosis of hypertension: Secondary | ICD-10-CM | POA: Diagnosis not present

## 2017-09-27 DIAGNOSIS — R0602 Shortness of breath: Secondary | ICD-10-CM | POA: Diagnosis not present

## 2017-09-27 NOTE — Progress Notes (Signed)
Selena [name] DOB: 1935/03/20 Encounter date: 09/27/2017  This is a 82 y.o. female who presents with Chief Complaint  Patient presents with  . Hospitalization Follow-up    discuss aneurysm    History of present illness:  Patient sent to ER due to shortness of breath and then finding of slightly elevated D dimer. Work up in [place] was negative PE.   She is having a hard time breathing on exertion. No chest pressure or tightness on exertion; just hard to catch breathe. Proventil in ER seemed to help with her breathing at rest, but doesn't seem to help her when she is exerting herself. Not having any pain.   Breathing isn't worse than it was a couple of weeks ago. Any activity seems to flare this. It has just stayed the same since getting worse.   Her dizziness does seem to be responding to otc medication, which is good.   Hasn't had issues with high bp in past; not been on medication. Was high in ER (states she was very scared) and elevated today. Was normal at last visit here yesterday.   Rash is still present (from EKG electrodes) but she feels that creams Dr. Maudie Mercury gave are helping.     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  Allergies  Allergen Reactions  . Codeine Phosphate Other (See Comments)    Hallucinations  . Codeine Other (See Comments)    Hallucinations    Current Meds  Medication Sig  . aspirin EC 81 MG tablet Take 81 mg by mouth daily.  . meclizine (ANTIVERT) 25 MG tablet Take 1 tablet (25 mg total) by mouth every 6 (six) hours as needed for dizziness.  . meloxicam (MOBIC) 7.5 MG tablet Take 1 tablet (7.5 mg total) by mouth daily.  . ondansetron (ZOFRAN) 4 MG tablet Take 1 tablet (4 mg total) by mouth every 8 (eight) hours as needed for nausea or vomiting.  . triamcinolone cream (KENALOG) 0.1 % Apply 1 application topically 2 (two) times daily.    Review of Systems  Constitutional: Positive for fatigue. Negative for chills and fever.   HENT: Negative for congestion, sinus pressure and sinus pain.   Respiratory: Positive for shortness of breath. Negative for cough and chest tightness.   Cardiovascular: Negative for chest pain, palpitations and leg swelling.  Gastrointestinal: Negative for abdominal pain.    Objective:  BP (!) 144/64 (BP Location: Left Arm, Patient Position: Sitting, Cuff Size: Normal)   Pulse 76   Temp 98 F (36.7 C) (Oral)   Wt 203 lb 1.6 oz (92.1 kg)   SpO2 96%   BMI 32.05 kg/m   Weight: 203 lb 1.6 oz (92.1 kg)   BP Readings from Last 3 Encounters:  09/27/17 (!) 144/64  09/26/17 120/60  09/22/17 (!) 193/76   Wt Readings from Last 3 Encounters:  09/27/17 203 lb 1.6 oz (92.1 kg)  09/26/17 203 lb 9.6 oz (92.4 kg)  09/20/17 203 lb 1.6 oz (92.1 kg)    Physical Exam  Constitutional: She appears well-developed and well-nourished. No distress.  Cardiovascular: Normal rate, regular rhythm and normal heart sounds.  Negative for LE edema  Pulmonary/Chest: Effort normal. No stridor. She has decreased breath sounds (slightly). She has no wheezes. She has no rhonchi. She has rales (subtle) in the right lower field.  Psychiatric: She has a normal mood and affect. Her behavior is normal. Thought content normal.    Assessment/Plan 1. Thoracic aortic aneurysm without rupture (  [place]) Will plan to re-evaluate in 6 months for stability; but this can also be discussed with cardiology.  - CT ANGIO CHEST AORTA W &/OR WO CONTRAST; Future - Ambulatory referral to Cardiology  2. Elevated blood pressure reading She will check readings at home and return for nurse visit for comparison.   3. Hyperthyroidism She has had some fluctuation in past with TSH; she prefers to put off further thyroid evaluation and we can just plan to recheck with future bloodwork.   4. Shortness of breath PE ruled out; EKG and troponins have been stable which makes acute cardiac cause very unlikely. I do think with severity of her  dyspnea and quick progression it is worthwhile to proceed with further evaluation.  - Ambulatory referral to Pulmonology - Ambulatory referral to Cardiology - Pulmonary function test; Future       Micheline Rough, MD

## 2017-09-27 NOTE — Patient Instructions (Addendum)
Get a home blood pressure cuff and check numbers at home. Please let me know if you are consistently running over 130/80. I would like you to drop off your blood pressure readings in a weeks time (you can schedule a nurse visit so that they can check your home cuff against ours).   If worsening of breathing you need to be evaluated.

## 2017-10-04 ENCOUNTER — Telehealth: Payer: Self-pay | Admitting: Family Medicine

## 2017-10-04 ENCOUNTER — Other Ambulatory Visit: Payer: Self-pay | Admitting: Family Medicine

## 2017-10-04 MED ORDER — AMLODIPINE BESYLATE 2.5 MG PO TABS: 2.5000 mg | ORAL_TABLET | Freq: Every day | ORAL | 0 refills | Status: DC

## 2017-10-04 NOTE — Telephone Encounter (Signed)
Noted. Thanks.

## 2017-10-04 NOTE — Telephone Encounter (Signed)
Patient has been given notes per Dr. Ethlyn Gallery. Patient has agreed to start medication. Rx has been sent in.

## 2017-10-04 NOTE — Telephone Encounter (Signed)
I see blood pressures that she has dropped off. Looks like she is ranging from mid 244'Q/28-638'T systolic often in the 771-165 range. HR has been in 60-80 range. I do think worthwhile to try medication and see if we can get numbers a little better. If she is willing I would recommend a low dose of amlodipine 2.5mg  daily. She can take any time of day. Would recommend she continue to monitor and then return for bp recheck in 1 month with home cuff/home readings. Certainly let me know if any troubles with low blood pressures in meanwhile (110/70 or less frequently) or not feeling well.

## 2017-10-05 ENCOUNTER — Encounter: Payer: Self-pay | Admitting: Family Medicine

## 2017-10-10 ENCOUNTER — Ambulatory Visit (INDEPENDENT_AMBULATORY_CARE_PROVIDER_SITE_OTHER): Payer: Medicare Other | Admitting: Internal Medicine

## 2017-10-10 DIAGNOSIS — R0602 Shortness of breath: Secondary | ICD-10-CM

## 2017-10-10 LAB — PULMONARY FUNCTION TEST
DL/VA % PRED: 83 %
DL/VA: 4.29 ml/min/mmHg/L
DLCO unc % pred: 60 %
DLCO unc: 17.08 ml/min/mmHg
FEF 25-75 Post: 2.64 L/sec
FEF 25-75 Pre: 2.12 L/sec
FEF2575-%Change-Post: 24 %
FEF2575-%PRED-PRE: 133 %
FEF2575-%Pred-Post: 165 %
FEV1-%Change-Post: 5 %
FEV1-%Pred-Post: 93 %
FEV1-%Pred-Pre: 88 %
FEV1-Post: 2.09 L
FEV1-Pre: 1.98 L
FEV1FVC-%CHANGE-POST: 6 %
FEV1FVC-%Pred-Pre: 109 %
FEV6-%CHANGE-POST: -1 %
FEV6-%Pred-Post: 84 %
FEV6-%Pred-Pre: 86 %
FEV6-PRE: 2.44 L
FEV6-Post: 2.4 L
FEV6FVC-%Pred-Post: 105 %
FEV6FVC-%Pred-Pre: 105 %
FVC-%CHANGE-POST: -1 %
FVC-%PRED-POST: 80 %
FVC-%Pred-Pre: 81 %
FVC-Post: 2.4 L
FVC-Pre: 2.44 L
PRE FEV1/FVC RATIO: 81 %
Post FEV1/FVC ratio: 87 %
Post FEV6/FVC ratio: 100 %
Pre FEV6/FVC Ratio: 100 %
RV % pred: 80 %
RV: 2.04 L
TLC % pred: 80 %
TLC: 4.43 L

## 2017-10-10 NOTE — Progress Notes (Signed)
PFT completed 10/10/17 per Dr. Ethlyn Gallery. I have placed a ticket to have her placed in the [facility] program.

## 2017-10-19 ENCOUNTER — Encounter: Payer: Self-pay | Admitting: Cardiovascular Disease

## 2017-10-23 ENCOUNTER — Ambulatory Visit: Payer: Medicare Other | Admitting: Cardiovascular Disease

## 2017-10-23 ENCOUNTER — Encounter: Payer: Self-pay | Admitting: Cardiovascular Disease

## 2017-10-23 VITALS — BP 158/80 | HR 72 | Ht 66.75 in | Wt 202.0 lb

## 2017-10-23 DIAGNOSIS — I712 Thoracic aortic aneurysm, without rupture, unspecified: Secondary | ICD-10-CM

## 2017-10-23 DIAGNOSIS — I10 Essential (primary) hypertension: Secondary | ICD-10-CM | POA: Diagnosis not present

## 2017-10-23 MED ORDER — AMLODIPINE BESYLATE 5 MG PO TABS: 5.0000 mg | ORAL_TABLET | Freq: Every day | ORAL | 3 refills | Status: DC

## 2017-10-23 NOTE — Progress Notes (Signed)
Chief Complaint  Patient presents with  . New Patient (Initial Visit)    Thoracic aortic aneurysm   History of Present Illness: 82 yo female with history of HTN, breast cancer, Non-Hodgkins lymphoma, arthritis, thoracic aortic aneurysm and atrial fibrillation who is here today as a new consult for the evaluation of dyspnea and thoracic aortic aneurysm. She was seen in the ED at [facility] August 2019 with c/o dyspnea for 2 months. D-dimer was elevated. CTA chest negative for PE. There was a 4.2 cm thoracic aortic aneurysm. No evidence of aortic dissection. There was no underlying lung disease. Echo August 2019 with mild LVH, normal LV systolic function with YSAY=30-16%. There was grade 2 diastolic dysfunction. She had breast cancer March 2013 treated with right mastectomy and chemotherapy. She had remote atrial fibrillation in 2008 and was seen by Dr. Caryl Comes. She thinks she had a cardioversion. She has not been anti-coagulation. She has had no palpitations. No chest pain. She does endorse dyspnea with minimal exertion. Abnormal PFTs. She has an appointment to see Pulmonary soon. No family history of CAD.   Primary Care Physician: Caren Macadam, MD   Past Medical History:  Diagnosis Date  . Anemia    PMH  . Atrial fibrillation ([place]) 01/01/2007  . BACK PAIN, UPPER 07/09/2008  . Breast cancer, stage 1 ([place])   . Bruises easily   . Cataract    history  . Complication of anesthesia   . Diverticulitis   . DIVERTICULOSIS, COLON 12/26/2006  . Follicular non-Hodgkin's lymphoma ([place])   . Goiter    multi-nodular  . Hx of colonoscopy 2009  . LIVER FUNCTION TESTS, ABNORMAL, HX OF 12/26/2007  . Memory loss 05/16/2007  . OSTEOARTHRITIS 12/26/2006   takes Diclofenac daily but has stopped for surgery  . PONV (postoperative nausea and vomiting)   . Skin cancer   . VERTIGO 09/07/2009  . Wears dentures   . Wears glasses   . Wears hearing aid    bilateral    Past Surgical History:  Procedure  Laterality Date  . ABDOMINAL HYSTERECTOMY    . ABLATION OF DYSRHYTHMIC FOCUS  2009  . APPENDECTOMY    . AUGMENTATION MAMMAPLASTY Right 2013  . BREAST BIOPSY  2013  . BREAST RECONSTRUCTION  04/14/2011   Procedure: BREAST RECONSTRUCTION;  Surgeon: Crissie Reese, MD;  Location: [facility];  Service: Plastics;  Laterality: Right;  Placement of Right Breast Tissue Expander with  use of Flex HD for Breast Reconstruction  . BREAST SURGERY     right sm, snbx  . CATARACT EXTRACTION W/ INTRAOCULAR LENS  IMPLANT, BILATERAL  2010   bilateral  . COLONOSCOPY    . COLONOSCOPY    . KNEE SURGERY  2004/2010   arthroscopic/ bilateral knee replacements  . MASTECTOMY Right 2013  . PORT-A-CATH REMOVAL N/A 12/07/2016   Procedure: REMOVAL PORT-A-CATH;  Surgeon: Rolm Bookbinder, MD;  Location: [facility];  Service: General;  Laterality: N/A;  . PORTACATH PLACEMENT Right 08/11/2014   Procedure: INSERTION PORT-A-CATH WITH ULTRASOUND;  Surgeon: Rolm Bookbinder, MD;  Location: [facility];  Service: General;  Laterality: Right;  . [place]   right  . tmi  1998  . TONSILLECTOMY      Current Outpatient Medications  Medication Sig Dispense Refill  . aspirin EC 81 MG tablet Take 81 mg by mouth daily.    . meclizine (ANTIVERT) 25 MG tablet Take 1 tablet (25 mg total) by mouth every 6 (six) hours as needed  for dizziness. 60 tablet 4  . meloxicam (MOBIC) 7.5 MG tablet Take 1 tablet (7.5 mg total) by mouth daily. 90 tablet 1  . ondansetron (ZOFRAN) 4 MG tablet Take 1 tablet (4 mg total) by mouth every 8 (eight) hours as needed for nausea or vomiting. 20 tablet 0  . amLODipine (NORVASC) 5 MG tablet Take 1 tablet (5 mg total) by mouth daily. 90 tablet 3   No current facility-administered medications for this visit.     Allergies  Allergen Reactions  . Codeine Phosphate Other (See Comments)    Hallucinations  . Codeine Other (See Comments)    Hallucinations     Social History   Socioeconomic History  .  Marital status: Widowed    Spouse name: Not on file  . Number of children: Not on file  . Years of education: Not on file  . Highest education level: Not on file  Occupational History  . Not on file  Social Needs  . Financial resource strain: Not on file  . Food insecurity:    Worry: Not on file    Inability: Not on file  . Transportation needs:    Medical: Not on file    Non-medical: Not on file  Tobacco Use  . Smoking status: Former Smoker    Packs/day: 0.10    Years: 20.00    Pack years: 2.00    Types: Cigarettes  . Smokeless tobacco: Former Systems developer    Quit date: 02/22/2017  . Tobacco comment: stopped 20-30 yers ago  Substance and Sexual Activity  . Alcohol use: No  . Drug use: No  . Sexual activity: Yes    Birth control/protection: Surgical  Lifestyle  . Physical activity:    Days per week: Not on file    Minutes per session: Not on file  . Stress: Not on file  Relationships  . Social connections:    Talks on phone: Not on file    Gets together: Not on file    Attends religious service: Not on file    Active member of club or organization: Not on file    Attends meetings of clubs or organizations: Not on file    Relationship status: Not on file  . Intimate partner violence:    Fear of current or ex partner: Not on file    Emotionally abused: Not on file    Physically abused: Not on file    Forced sexual activity: Not on file  Other Topics Concern  . Not on file  Social History Narrative  . Not on file    Family History  Problem Relation Age of Onset  . Colon cancer Mother   . Cancer Mother        colon  . Colon cancer Maternal Aunt   . Cancer Maternal Uncle   . Prostate cancer Maternal Uncle   . Prostate cancer Maternal Uncle   . Other Maternal Uncle   . Cancer Sister        kidney  . Cancer Brother        lymph node  . Anesthesia problems Neg Hx   . Hypotension Neg Hx   . Malignant hyperthermia Neg Hx   . Pseudochol deficiency Neg Hx   . Breast  cancer Neg Hx     Review of Systems:  As stated in the HPI and otherwise negative.   BP (!) 158/80   Pulse 72   Ht 5' 6.75" (1.695 m)   Wt 202 lb (  91.6 kg)   SpO2 98%   BMI 31.88 kg/m   Physical Examination: General: Well developed, well nourished, NAD  HEENT: OP clear, mucus membranes moist  SKIN: warm, dry. No rashes. Neuro: No focal deficits  Musculoskeletal: Muscle strength 5/5 all ext  Psychiatric: Mood and affect normal  Neck: No JVD, no carotid bruits, no thyromegaly, no lymphadenopathy.  Lungs:Clear bilaterally, no wheezes, rhonci, crackles Cardiovascular: Regular rate and rhythm. No murmurs, gallops or rubs. Abdomen:Soft. Bowel sounds present. Non-tender.  Extremities: No lower extremity edema. Pulses are 2 + in the bilateral DP/PT.  EKG:  EKG is not ordered today. The ekg ordered today demonstrates  I reviewed the EKG from 09/22/17 which shows sinus rhythm  Recent Labs: 08/28/2017: ALT 44; TSH 0.20 09/22/2017: BUN 16; Creatinine, Ser 0.94; Hemoglobin 14.8; Platelets 161; Potassium 4.1; Pro B Natriuretic peptide (BNP) 209.0; Sodium 142   Lipid Panel    Component Value Date/Time   CHOL 169 08/28/2017 1410   TRIG 103.0 08/28/2017 1410   HDL 43.20 08/28/2017 1410   CHOLHDL 4 08/28/2017 1410   VLDL 20.6 08/28/2017 1410   LDLCALC 105 (H) 08/28/2017 1410   LDLDIRECT 133.0 08/23/2016 1403     Wt Readings from Last 3 Encounters:  10/23/17 202 lb (91.6 kg)  09/27/17 203 lb 1.6 oz (92.1 kg)  09/26/17 203 lb 9.6 oz (92.4 kg)     Other studies Reviewed: Additional studies/ records that were reviewed today include: . Review of the above records demonstrates:    Assessment and Plan:   1. Thoracic aortic aneurysm: She has a slight dilation of ascending thoracic aorta, 4.2 cm. Will repeat CTA chest in August 2020.   2. Dyspnea: Her lungs are clear on exam. She has no evidence of volume overload. She has normal LV systolic function on echo. PA systolic pressure normal  on echo. No chest pain. She has an appt with Pulmonary coming up for further assessment.   3. HTN: BP has been elevated at home and is elevated today. Will increase Norvasc to 5 mg daily.   Current medicines are reviewed at length with the patient today.  The patient does not have concerns regarding medicines.  The following changes have been made:  no change  Labs/ tests ordered today include:   Orders Placed This Encounter  Procedures  . CT ANGIO CHEST AORTA W &/OR WO CONTRAST  . Basic Metabolic Panel (BMET)     Disposition:   FU with me in 12 months   Signed, Lauree Chandler, MD 10/23/2017 10:20 AM    [facility] [address], [place], [place]  [address] Phone: [phone]; Fax: [phone]

## 2017-10-23 NOTE — Patient Instructions (Signed)
Medication Instructions:   Your physician has recommended you make the following change in your medication:  Increase Norvasc to 5 mg by mouth daily.    Labwork: Your physician recommends that you return for lab work in: late August 2020--(BMP).  Week or so prior to CT Scan   Testing/Procedures: Non-Cardiac CT Angiography (CTA), is a special type of CT scan that uses a computer to produce multi-dimensional views of major blood vessels throughout the body. In CT angiography, a contrast material is injected through an IV to help visualize the blood vessels  To be done in early September 2020   Follow-Up: Your physician recommends that you schedule a follow-up appointment in: 12 months. Please call our office in about 8 months to schedule this appointment    Any Other Special Instructions Will Be Listed Below (If Applicable).     If you need a refill on your cardiac medications before your next appointment, please call your pharmacy.

## 2017-10-25 ENCOUNTER — Telehealth: Payer: Self-pay | Admitting: Family Medicine

## 2017-10-25 NOTE — Telephone Encounter (Signed)
Prior auth for Meclizine sent to [email or web address]-key [id number].

## 2017-10-25 NOTE — Telephone Encounter (Signed)
Copied from [place] [id number]. Topic: General - Other >> Oct 25, 2017 11:57 AM Lennox Solders wrote: Reason for HKU:VJDYN [facility] is calling and patient needs PA for meclizine. Please call [phone]. [place]

## 2017-10-26 ENCOUNTER — Other Ambulatory Visit (INDEPENDENT_AMBULATORY_CARE_PROVIDER_SITE_OTHER): Payer: Medicare Other

## 2017-10-26 ENCOUNTER — Encounter: Payer: Self-pay | Admitting: Internal Medicine

## 2017-10-26 ENCOUNTER — Ambulatory Visit: Payer: Medicare Other | Admitting: Internal Medicine

## 2017-10-26 VITALS — BP 160/90 | HR 92 | Ht 65.5 in | Wt 201.4 lb

## 2017-10-26 DIAGNOSIS — R0609 Other forms of dyspnea: Secondary | ICD-10-CM | POA: Insufficient documentation

## 2017-10-26 DIAGNOSIS — R058 Other specified cough: Secondary | ICD-10-CM

## 2017-10-26 DIAGNOSIS — I10 Essential (primary) hypertension: Secondary | ICD-10-CM

## 2017-10-26 DIAGNOSIS — R05 Cough: Secondary | ICD-10-CM | POA: Diagnosis not present

## 2017-10-26 LAB — CBC WITH DIFFERENTIAL/PLATELET
BASOS PCT: 0.2 % (ref 0.0–3.0)
Basophils Absolute: 0 10*3/uL (ref 0.0–0.1)
EOS ABS: 0.3 10*3/uL (ref 0.0–0.7)
Eosinophils Relative: 4.7 % (ref 0.0–5.0)
HEMATOCRIT: 43.6 % (ref 36.0–46.0)
HEMOGLOBIN: 15.1 g/dL — AB (ref 12.0–15.0)
Lymphocytes Relative: 15.9 % (ref 12.0–46.0)
Lymphs Abs: 1.2 10*3/uL (ref 0.7–4.0)
MCHC: 34.6 g/dL (ref 30.0–36.0)
MCV: 97.5 fl (ref 78.0–100.0)
Monocytes Absolute: 1 10*3/uL (ref 0.1–1.0)
Monocytes Relative: 12.9 % — ABNORMAL HIGH (ref 3.0–12.0)
NEUTROS ABS: 4.9 10*3/uL (ref 1.4–7.7)
Neutrophils Relative %: 66.3 % (ref 43.0–77.0)
Platelets: 180 10*3/uL (ref 150.0–400.0)
RBC: 4.47 Mil/uL (ref 3.87–5.11)
RDW: 13.2 % (ref 11.5–15.5)
WBC: 7.4 10*3/uL (ref 4.0–10.5)

## 2017-10-26 LAB — SEDIMENTATION RATE: Sed Rate: 53 mm/hr — ABNORMAL HIGH (ref 0–30)

## 2017-10-26 MED ORDER — PANTOPRAZOLE SODIUM 40 MG PO TBEC: 40.0000 mg | DELAYED_RELEASE_TABLET | Freq: Every day | ORAL | 2 refills | Status: DC

## 2017-10-26 MED ORDER — FAMOTIDINE 20 MG PO TABS: ORAL_TABLET | ORAL | 11 refills | Status: DC

## 2017-10-26 NOTE — Progress Notes (Addendum)
Selena [name], female    DOB: [birth year]      MRN: [id number]    Brief patient profile:  46 yowf minimal smoking hx quit in 1980 with dx of breast ca then follicular lymphoma rx from 7/201/6 with    Bendamustine and Rituxan day 1, 2 every 4 weeks 6 cycles followed by Rituxan maintenance every 2 months 2 years    last rx was completed in ? 11/2016  s resp problems then cough /sob since august 2019 so referred to pulmonary clinic 10/26/2017 by Dr   Selena [name]     10/26/2017  Pulmonary/ Initial office eval/ Selena [name] Chief Complaint  Patient presents with  . Pulmonary Consult    Referred by Dr. Ethlyn [name].  Pt c/o DOE x 2 months. She states she walks alot for exercise and has noticed she can't make as many laps as she used to.    Dyspnea:   onset indolent and pattern is progressive to point of 200 ft flat gives out Cough: esp with voice use / not turnal, dry / raspy quality assoc with onset of sob  Sleep: lie flat one pillow ok SABA use: none    No obvious day to day or daytime variability or assoc excess/ purulent sputum or mucus plugs or hemoptysis or cp or chest tightness, subjective wheeze or overt sinus or hb symptoms.   Sleeps as above without nocturnal  or early am exacerbation  of respiratory  c/o's or need for noct saba. Also denies any obvious fluctuation of symptoms with weather or environmental changes or other aggravating or alleviating factors except as outlined above   No unusual exposure hx or h/o childhood pna/ asthma or knowledge of premature birth.  Current Allergies, Complete Past Medical History, Past Surgical History, Family History, and Social History were reviewed in [employer] record.  ROS  The following are not active complaints unless bolded Hoarseness, sore throat, dysphagia, dental problems, itching, sneezing,  nasal congestion or discharge of excess mucus or purulent secretions, ear ache,   fever, chills, sweats, unintended wt loss or wt  gain, classically pleuritic or exertional cp,  orthopnea pnd or arm/hand swelling  or leg swelling, presyncope, palpitations, abdominal pain, anorexia, nausea, vomiting, diarrhea  or change in bowel habits or change in bladder habits, change in stools or change in urine, dysuria, hematuria,  rash, arthralgias, visual complaints, headache, numbness, weakness or ataxia or problems with walking or coordination,  change in mood or  memory.           Past Medical History:  Diagnosis Date  . Anemia    PMH  . Atrial fibrillation ([place]) 01/01/2007  . BACK PAIN, UPPER 07/09/2008  . Breast cancer, stage 1 ([place])   . Bruises easily   . Cataract    history  . Complication of anesthesia   . Diverticulitis   . DIVERTICULOSIS, COLON 12/26/2006  . Follicular non-Hodgkin's lymphoma ([place])   . Goiter    multi-nodular  . Hx of colonoscopy 2009  . LIVER FUNCTION TESTS, ABNORMAL, HX OF 12/26/2007  . Memory loss 05/16/2007  . OSTEOARTHRITIS 12/26/2006   takes Diclofenac daily but has stopped for surgery  . PONV (postoperative nausea and vomiting)   . Skin cancer   . VERTIGO 09/07/2009  . Wears dentures   . Wears glasses   . Wears hearing aid    bilateral    Outpatient Medications Prior to Visit  Medication Sig Dispense Refill  . amLODipine (NORVASC) 5 MG  tablet Take 1 tablet (5 mg total) by mouth daily. 90 tablet 3  . aspirin EC 81 MG tablet Take 81 mg by mouth daily.    . meclizine (ANTIVERT) 25 MG tablet Take 1 tablet (25 mg total) by mouth every 6 (six) hours as needed for dizziness. 60 tablet 4  . meloxicam (MOBIC) 7.5 MG tablet Take 1 tablet (7.5 mg total) by mouth daily. 90 tablet 1  . ondansetron (ZOFRAN) 4 MG tablet Take 1 tablet (4 mg total) by mouth every 8 (eight) hours as needed for nausea or vomiting. 20 tablet 0             Objective:     BP (!) 160/90   Pulse 92   Ht 5' 5.5" (1.664 m)   Wt 201 lb 6.4 oz (91.4 kg)   SpO2 94%   BMI 33.00 kg/m   SpO2: 94 % RA  And note bp  160/90    Pleasant amb wf nad    HEENT: full dentures with nl  turbinates bilaterally, and oropharynx. Nl external ear canals without cough reflex   NECK :  without JVD/Nodes/TM/ nl carotid upstrokes bilaterally   LUNGS: no acc muscle use,  Nl contour chest with min crackles in bases R > L without cough on insp or exp maneuvers   CV:  RRR  no s3 or murmur or increase in P2, and no edema   ABD:  soft and nontender with nl inspiratory excursion in the supine position. No bruits or organomegaly appreciated, bowel sounds nl  MS:  Nl gait/ ext warm without deformities, calf tenderness, cyanosis or clubbing No obvious joint restrictions   SKIN: warm and dry without lesions    NEURO:  alert, approp, nl sensorium with  no motor or cerebellar deficits apparent.      I personally reviewed images and agree with radiology impression as follows:   Chest CTa  09/22/17 Negative for pulmonary embolism.  No acute abnormality in the chest Ascending aortic aneurysm 4.2 cm.   Labs ordered 10/26/2017  Allergy profile     Lab Results  Component Value Date   ESRSEDRATE 53 (H) 10/26/2017       Labs  reviewed:      Chemistry      Component Value Date/Time   NA 142 09/22/2017 1805   NA 143 12/06/2016 1057   K 4.1 09/22/2017 1805   K 4.2 12/06/2016 1057   CL 107 09/22/2017 1805   CL 107 04/05/2012 1253   CO2 25 09/22/2017 1805   CO2 27 12/06/2016 1057   BUN 16 09/22/2017 1805   BUN 18.5 12/06/2016 1057   CREATININE 0.94 09/22/2017 1805   CREATININE 0.8 12/06/2016 1057      Component Value Date/Time   CALCIUM 9.3 09/22/2017 1805   CALCIUM 9.5 12/06/2016 1057   ALKPHOS 91 08/28/2017 1410   ALKPHOS 110 12/06/2016 1057   AST 36 08/28/2017 1410   AST 41 (H) 12/06/2016 1057   ALT 44 (H) 08/28/2017 1410   ALT 57 (H) 12/06/2016 1057   BILITOT 0.7 08/28/2017 1410   BILITOT 0.56 12/06/2016 1057        Lab Results  Component Value Date   WBC 7.4 10/26/2017   HGB 15.1 (H)  10/26/2017   HCT 43.6 10/26/2017   MCV 97.5 10/26/2017   PLT 180.0 10/26/2017     Lab Results  Component Value Date   DDIMER 0.73 (H) 09/22/2017      Lab Results  Component Value Date   TSH 0.20 (L) 08/28/2017     Lab Results  Component Value Date   PROBNP 209.0 (H) 09/22/2017       Lab Results  Component Value Date   ESRSEDRATE 53 (H) 10/26/2017          Assessment   DOE (dyspnea on exertion) PFT's  917/19  FEV1 2.09 (93 % ) ratio 87  p 5 % improvement from saba p nothing prior to study with DLCO  60 % corrects to 83 % for alv volume  -  FVC 2.44 (81%)  10/26/2017   Walked RA x one lap @ 185 stopped due to  Sob at fast pace but no desats   She has been exposed to alkylating agents so could have mild PF on this basis but the hx/ timing are odd in that no exposure for almost a year prior to onset of any symptoms but ESR is moderately elevated and so could have other inflammatory causes of ILD that will need to be explored over time   Regardless, first step for me with possible ILD is to treat the easily treatable and since there is assoc cough rec gerd rx based on Use of PPI is associated with improved survival time and with decreased radiologic fibrosis per King's study published in [facility] vol 184 p1390.  Dec 2011 and also may have other beneficial effects as per the latest review in [place] vol 193 O0321 Jun 20016.  This may not always be cause and effect, but given how universally unimpressive and expensive  all the other  Drugs developed to day  have been for pf,   rec start  rx ppi / diet/ lifestyle modification and f/u with serial walking sats and lung volumes for now to put more points on the curve / establish firm baseline before considering additional measures.   See also concern with abn bnp/ diastolic dysfunction listed under hbp a/p   F/u q 6 weeks for now      Upper airway cough syndrome pfts 10/10/17 s sign airflow obst though the insp loop is somewhat  flattened  - Allergy profile 10/26/2017 >  Eos 0.3 /  IgE  Pending    Upper airway cough syndrome (previously labeled PNDS),  is so named because it's frequently impossible to sort out how much is  CR/sinusitis with freq throat clearing (which can be related to primary GERD)   vs  causing  secondary (" extra esophageal")  GERD from wide swings in gastric pressure that occur with throat clearing, often  promoting self use of mint and menthol lozenges that reduce the lower esophageal sphincter tone and exacerbate the problem further in a cyclical fashion.   These are the same pts (now being labeled as having "irritable larynx syndrome" by some cough centers) who not infrequently have a history of having failed to tolerate ace inhibitors,  dry powder inhalers or biphosphonates or report having atypical/extraesophageal reflux symptoms that don't respond to standard doses of PPI  and are easily confused as having aecopd or asthma flares by even experienced allergists/ pulmonologists (myself included).   For now rx for gerd aggressively and f/u in 6 weeks   Discussed in detail all the  indications, usual  risks and alternatives  relative to the benefits with patient who agrees to proceed with conservative f/u as outlined       Essential hypertension Echo 09/07/17  - Left ventricle: The cavity size was normal. Wall thickness was  increased in a pattern of mild LVH. Systolic function was normal.   The estimated ejection fraction was in the range of 55% to 60%.   Wall motion was normal; there were no regional wall motion   abnormalities. Features are consistent with a pseudonormal left   ventricular filling pattern, with concomitant abnormal relaxation   and increased filling pressure (grade 2 diastolic dysfunction). - Left atrium: The atrium was mildly dilated. Volume/bsa, ES,   (1-plane Simpson&'s, A2C): 35 ml/m^2   I note bp and pulse elevated today and BNP intermediate/  TSH relatively low with  relatively high Free T3 so ? If not borderline hyperthyroid and may benefit either way from low doses of betablockers so suggest a trial bisoprolol 5 mg daily since In the setting of respiratory symptoms of unknown etiology,  It would be preferable to use bystolic, the most beta -1  selective Beta blocker available in sample form, with bisoprolol the most selective generic choice  on the market, at least on a trial basis, to make sure the spillover Beta 2 effects of the less specific Beta blockers are not contributing to this patient's symptoms.      Total time devoted to counseling  > 50 % of initial 60 min office visit:  review case with pt/ discussion of options/alternatives/ personally creating written customized instructions  in presence of pt  then going over those specific  Instructions directly with the pt including how to use all of the meds but in particular covering each new medication in detail and the difference between the maintenance= "automatic" meds and the prns using an action plan format for the latter (If this problem/symptom => do that organization reading Left to right).  Please see AVS from this visit for a full list of these instructions which I personally wrote for this pt and  are unique to this visit.      Christinia Gully, MD 10/26/2017

## 2017-10-26 NOTE — Patient Instructions (Addendum)
Pantoprazole (protonix) 40 mg   Take  30-60 min before first meal of the day and Pepcid (famotidine)  20 mg one @  bedtime until return to office - this is the best way to tell whether stomach acid is contributing to your problem.    GERD (REFLUX)  is an extremely common cause of respiratory symptoms just like yours , many times with no obvious heartburn at all.    It can be treated with medication, but also with lifestyle changes including elevation of the head of your bed (ideally with 6 inch  bed blocks),  Smoking cessation, avoidance of late meals, excessive alcohol, and avoid fatty foods, chocolate, peppermint, colas, red wine, and acidic juices such as orange juice.  NO MINT OR MENTHOL PRODUCTS SO NO COUGH DROPS   USE SUGARLESS CANDY INSTEAD (Jolley ranchers or Stover's or Life Savers) or even ice chips will also do - the key is to swallow to prevent all throat clearing. NO OIL BASED VITAMINS - use powdered substitutes.   Please remember to go to the lab department downstairs in the basement  for your tests - we will call you with the results when they are available.      Please schedule a follow up office visit in 6 weeks, call sooner if needed  -  Add :  Try bisoprolol 5 mg daily if tolerates/ insurance covers

## 2017-10-27 ENCOUNTER — Encounter: Payer: Self-pay | Admitting: Internal Medicine

## 2017-10-27 ENCOUNTER — Telehealth: Payer: Self-pay | Admitting: Internal Medicine

## 2017-10-27 ENCOUNTER — Telehealth: Payer: Self-pay | Admitting: *Deleted

## 2017-10-27 ENCOUNTER — Other Ambulatory Visit: Payer: Self-pay | Admitting: Family Medicine

## 2017-10-27 ENCOUNTER — Other Ambulatory Visit: Payer: Self-pay | Admitting: Internal Medicine

## 2017-10-27 DIAGNOSIS — J3489 Other specified disorders of nose and nasal sinuses: Secondary | ICD-10-CM | POA: Insufficient documentation

## 2017-10-27 DIAGNOSIS — J300 Vasomotor rhinitis: Secondary | ICD-10-CM | POA: Insufficient documentation

## 2017-10-27 DIAGNOSIS — J310 Chronic rhinitis: Secondary | ICD-10-CM | POA: Insufficient documentation

## 2017-10-27 DIAGNOSIS — R05 Cough: Secondary | ICD-10-CM | POA: Insufficient documentation

## 2017-10-27 DIAGNOSIS — B9789 Other viral agents as the cause of diseases classified elsewhere: Principal | ICD-10-CM

## 2017-10-27 DIAGNOSIS — I10 Essential (primary) hypertension: Secondary | ICD-10-CM | POA: Insufficient documentation

## 2017-10-27 DIAGNOSIS — J069 Acute upper respiratory infection, unspecified: Secondary | ICD-10-CM

## 2017-10-27 DIAGNOSIS — R058 Other specified cough: Secondary | ICD-10-CM

## 2017-10-27 HISTORY — DX: Essential (primary) hypertension: I10

## 2017-10-27 HISTORY — DX: Other specified cough: R05.8

## 2017-10-27 LAB — RESPIRATORY ALLERGY PROFILE REGION II ~~LOC~~
Allergen, A. alternata, m6: <0.1 kU/L
Allergen, Comm Silver Birch, t9: <0.1 kU/L
Allergen, Cottonwood, t14: <0.1 kU/L
Aspergillus fumigatus, m3: <0.1 kU/L
Bermuda Grass: <0.1 kU/L
Box Elder IgE: <0.1 kU/L
CLADOSPORIUM HERBARUM (M2) IGE: <0.1 kU/L
CLASS: 0
CLASS: 0
CLASS: 0
CLASS: 0
CLASS: 0
CLASS: 0
CLASS: 0
CLASS: 0
COMMON RAGWEED (SHORT) (W1) IGE: <0.1 kU/L
Class: 0
Class: 0
Class: 0
Class: 0
Class: 0
Class: 0
Class: 0
Class: 0
Class: 0
Class: 0
Class: 0
Class: 0
Class: 0
Class: 0
Class: 0
Class: 0
Dog Dander: <0.1 kU/L
IGE (IMMUNOGLOBULIN E), SERUM: 6 kU/L (ref <=114)
Pecan/Hickory Tree IgE: <0.1 kU/L
Sheep Sorrel IgE: <0.1 kU/L

## 2017-10-27 LAB — INTERPRETATION:

## 2017-10-27 MED ORDER — METOPROLOL TARTRATE 50 MG PO TABS: 50.0000 mg | ORAL_TABLET | Freq: Two times a day (BID) | ORAL | 0 refills | Status: DC

## 2017-10-27 MED ORDER — BISOPROLOL FUMARATE 5 MG PO TABS: 5.0000 mg | ORAL_TABLET | Freq: Every day | ORAL | 0 refills | Status: DC

## 2017-10-27 NOTE — Telephone Encounter (Signed)
LMTCB

## 2017-10-27 NOTE — Telephone Encounter (Signed)
-----   Message from Tanda Rockers, MD sent at 10/27/2017  6:40 AM EDT ----- After further review,  It may be that her bp is causing her sob so rec trial of bisoprolol 5 mg daily until returns - if too strong can cut in  Half

## 2017-10-27 NOTE — Telephone Encounter (Signed)
Pt returning call. Contact number [phone]

## 2017-10-27 NOTE — Assessment & Plan Note (Addendum)
Echo 09/07/17  - Left ventricle: The cavity size was normal. Wall thickness was   increased in a pattern of mild LVH. Systolic function was normal.   The estimated ejection fraction was in the range of 55% to 60%.   Wall motion was normal; there were no regional wall motion   abnormalities. Features are consistent with a pseudonormal left   ventricular filling pattern, with concomitant abnormal relaxation   and increased filling pressure (grade 2 diastolic dysfunction). - Left atrium: The atrium was mildly dilated. Volume/bsa, ES,   (1-plane Simpson&'s, A2C): 35 ml/m^2   I note bp and pulse elevated today and BNP intermediate/  TSH relatively low with relatively high Free T3 so ? If not borderline hyperthyroid and may benefit either way from low doses of betablockers so suggest a trial bisoprolol 5 mg daily since In the setting of respiratory symptoms of unknown etiology,  It would be preferable to use bystolic, the most beta -1  selective Beta blocker available in sample form, with bisoprolol the most selective generic choice  on the market, at least on a trial basis, to make sure the spillover Beta 2 effects of the less specific Beta blockers are not contributing to this patient's symptoms.      Total time devoted to counseling  > 50 % of initial 60 min office visit:  review case with pt/ discussion of options/alternatives/ personally creating written customized instructions  in presence of pt  then going over those specific  Instructions directly with the pt including how to use all of the meds but in particular covering each new medication in detail and the difference between the maintenance= "automatic" meds and the prns using an action plan format for the latter (If this problem/symptom => do that organization reading Left to right).  Please see AVS from this visit for a full list of these instructions which I personally wrote for this pt and  are unique to this visit.

## 2017-10-27 NOTE — Telephone Encounter (Signed)
Then cancel the bisoprolol and just start lopressor 50 mg bid  This is added to the norvasc If too lopressor too strong (light headed standing) can stop the norvasc

## 2017-10-27 NOTE — Assessment & Plan Note (Addendum)
pfts 10/10/17 s sign airflow obst though the insp loop is somewhat flattened  - Allergy profile 10/26/2017 >  Eos 0.3 /  IgE  Pending    Upper airway cough syndrome (previously labeled PNDS),  is so named because it's frequently impossible to sort out how much is  CR/sinusitis with freq throat clearing (which can be related to primary GERD)   vs  causing  secondary (" extra esophageal")  GERD from wide swings in gastric pressure that occur with throat clearing, often  promoting self use of mint and menthol lozenges that reduce the lower esophageal sphincter tone and exacerbate the problem further in a cyclical fashion.   These are the same pts (now being labeled as having "irritable larynx syndrome" by some cough centers) who not infrequently have a history of having failed to tolerate ace inhibitors,  dry powder inhalers or biphosphonates or report having atypical/extraesophageal reflux symptoms that don't respond to standard doses of PPI  and are easily confused as having aecopd or asthma flares by even experienced allergists/ pulmonologists (myself included).   For now rx for gerd aggressively and f/u in 6 weeks   Discussed in detail all the  indications, usual  risks and alternatives  relative to the benefits with patient who agrees to proceed with conservative f/u as outlined

## 2017-10-27 NOTE — Telephone Encounter (Signed)
Called and spoke to patient, made patient aware we are discontinuing her Zebeta and starting her on Lopressor 50mg  BID that she is to use in addition to the norvasc. Also instructed patient that if she starts feeling light headed or if she feels the medicine is too strong we will stop the norvasc. Patient voiced understanding. Called and informed pharmacy to d/c the zebeta and only fill the lopressor. Nothing further is needed at this time.

## 2017-10-27 NOTE — Telephone Encounter (Signed)
Rx not on current med list Last filled 02/06/17 by Dr. Charlane Ferretti to fill?

## 2017-10-27 NOTE — Telephone Encounter (Signed)
Called pt who stated pharmacy had called her stating the pharmacy was having problems with BP medicines due to a prior auth possibly needing to be done. I stated to pt if a prior auth was faxed to our office for the bisoprolol we would get it taken care of.  Pt also wanted to know if she still needed to take the amlodipine 5mg  tablet that was previously prescribed by another doctor.  MW, please advise if pt should still be taking the amlodipine once pt does start taking the bisoprolol 5mg  tablet?  Thanks!

## 2017-10-27 NOTE — Telephone Encounter (Signed)
Called and spoke with pt letting her know that due to the SOB as well as her BP, MW wanted her to do a trial of bisoprolol 5mg .  Pt expressed understanding. I stated to pt if the 5mg  was too strong that she could cut it inhalf.  I also put that in the instructions on the Rx for pt.  Nothing further needed.

## 2017-10-27 NOTE — Assessment & Plan Note (Addendum)
PFT's  917/19  FEV1 2.09 (93 % ) ratio 87  p 5 % improvement from saba p nothing prior to study with DLCO  60 % corrects to 83 % for alv volume  -  FVC 2.44 (81%)  10/26/2017   Walked RA x one lap @ 185 stopped due to  Sob at fast pace but no desats   She has been exposed to alkylating agents so could have mild PF on this basis but the hx/ timing are odd in that no exposure for almost a year prior to onset of any symptoms but ESR is moderately elevated and so could have other inflammatory causes of ILD that will need to be explored over time   Regardless, first step for me with possible ILD is to treat the easily treatable and since there is assoc cough rec gerd rx based on Use of PPI is associated with improved survival time and with decreased radiologic fibrosis per King's study published in [facility] vol 184 p1390.  Dec 2011 and also may have other beneficial effects as per the latest review in [place] vol 193 X2158 Jun 20016.  This may not always be cause and effect, but given how universally unimpressive and expensive  all the other  Drugs developed to day  have been for pf,   rec start  rx ppi / diet/ lifestyle modification and f/u with serial walking sats and lung volumes for now to put more points on the curve / establish firm baseline before considering additional measures.   See also concern with abn bnp/ diastolic dysfunction listed under hbp a/p   F/u q 6 weeks for now

## 2017-10-27 NOTE — Telephone Encounter (Signed)
This is Dr Ethlyn Gallery

## 2017-10-30 ENCOUNTER — Telehealth: Payer: Self-pay | Admitting: Internal Medicine

## 2017-10-30 ENCOUNTER — Telehealth: Payer: Self-pay | Admitting: Family Medicine

## 2017-10-30 NOTE — Progress Notes (Signed)
Spoke with pt and notified of results per Dr. Wert. Pt verbalized understanding and denied any questions. 

## 2017-10-30 NOTE — Telephone Encounter (Signed)
Copied from [place] [id number]. Topic: General - Other >> Oct 30, 2017 11:34 AM Yvette Rack wrote: Reason for CRM: pt states that she is on two different BP medicines she thinks that its to much Doctor Wert put her on metoprolol tartrate (LOPRESSOR) 50 MG tablet and heart Doctor put her on amLODipine (NORVASC) 5 MG tablet

## 2017-10-30 NOTE — Telephone Encounter (Signed)
Spoke with pt. Pt was confused if she should take Amlodipine and Metoprolol. Advised her per MW's documentation from 10/27/17, that she should be taking both medications. She verbalized understanding. Nothing further was needed at this time.

## 2017-10-30 NOTE — Telephone Encounter (Signed)
Spoke to the pt and advised her to call Dr. Gustavus Bryant office as she does not understand why she was given this medication and how she should be taking it.  Pt agreed to call.  No further action required.

## 2017-11-01 ENCOUNTER — Telehealth: Payer: Self-pay | Admitting: Hematology and Oncology

## 2017-11-01 NOTE — Telephone Encounter (Signed)
[facility] PAL 11/11 - moved appointments to 11/15. Spoke with patient and per patient moved from 11/15 to 11/18 due to she needs to come later in the morning. Patient has new date/time for 11/18.

## 2017-11-08 NOTE — Telephone Encounter (Signed)
Fax received from [facility] stating the request was denied and this was given to Dr Koberlein's asst.

## 2017-11-09 ENCOUNTER — Encounter: Payer: Self-pay | Admitting: Family Medicine

## 2017-11-09 ENCOUNTER — Ambulatory Visit: Payer: Medicare Other | Admitting: Family Medicine

## 2017-11-09 VITALS — BP 120/58 | HR 58 | Temp 97.8°F | Wt 204.0 lb

## 2017-11-09 DIAGNOSIS — M25522 Pain in left elbow: Secondary | ICD-10-CM

## 2017-11-09 DIAGNOSIS — M25512 Pain in left shoulder: Secondary | ICD-10-CM | POA: Diagnosis not present

## 2017-11-09 DIAGNOSIS — R05 Cough: Secondary | ICD-10-CM | POA: Diagnosis not present

## 2017-11-09 DIAGNOSIS — R059 Cough, unspecified: Secondary | ICD-10-CM

## 2017-11-09 DIAGNOSIS — E0590 Thyrotoxicosis, unspecified without thyrotoxic crisis or storm: Secondary | ICD-10-CM

## 2017-11-09 MED ORDER — BENZONATATE 100 MG PO CAPS: 100.0000 mg | ORAL_CAPSULE | Freq: Three times a day (TID) | ORAL | 0 refills | Status: DC

## 2017-11-09 MED ORDER — METHYLPREDNISOLONE ACETATE 80 MG/ML IJ SUSP
80.0000 mg | Freq: Once | INTRAMUSCULAR | Status: AC
  Administered 2017-11-09: 80 mg via INTRAMUSCULAR

## 2017-11-09 MED ORDER — DOXYCYCLINE HYCLATE 100 MG PO TABS: 100.0000 mg | ORAL_TABLET | Freq: Two times a day (BID) | ORAL | 0 refills | Status: DC

## 2017-11-09 NOTE — Patient Instructions (Signed)
Return for xray tomorrow. I will call you with results.   Let me know if any worsening of cough symptoms in meanwhile or increased arm pain.

## 2017-11-09 NOTE — Progress Notes (Signed)
Selena [name] DOB: 1935-12-29 Encounter date: 11/09/2017  This is a 82 y.o. female who presents with Chief Complaint  Patient presents with  . Fall    sunday, fell walking down the stairs, landed on left arm,upper arm in painful to move, unable to lift the arm, does not hurt to the touch, pt states she was in the rain for about 60mins before her son was able to help her up, now has a cough, coughing up mucus, slight runny nose     History of present illness:  Not feeling better in terms of breathing.   After sitting in rain for 15 minutes, then started productive cough next day, hoarse voice.   Arm no better today.   Does feel wheezy. Has felt a little feverish at home - more just cold.   Slid off bed this morning and couldn't get up. Had to call ambulance and they came out to help her. With bad knees and now arm just can't get up from floor.    BP medication was increased (metoproll) to 50mg  BID. BP have been in the 130-150 range although this morning bp was 106/59. She does still have intermittent episodes of the vertigo.   Allergies  Allergen Reactions  . Codeine Phosphate Other (See Comments)    Hallucinations  . Codeine Other (See Comments)    Hallucinations    Current Meds  Medication Sig  . amLODipine (NORVASC) 5 MG tablet Take 1 tablet (5 mg total) by mouth daily.  [name] aspirin EC 81 MG tablet Take 81 mg by mouth daily.  . bisoprolol (ZEBETA) 5 MG tablet TAKE 1 TABLET BY MOUTH DAILY. PILL CAN BE CUT IN HALF IF TOO STRONG  . famotidine (PEPCID) 20 MG tablet One at bedtime  . fluticasone (FLONASE) 50 MCG/ACT nasal spray 1 spray each nostril daily  . meclizine (ANTIVERT) 25 MG tablet Take 1 tablet (25 mg total) by mouth every 6 (six) hours as needed for dizziness.  . meloxicam (MOBIC) 7.5 MG tablet Take 1 tablet (7.5 mg total) by mouth daily.  . metoprolol tartrate (LOPRESSOR) 50 MG tablet Take 1 tablet (50 mg total) by mouth 2 (two) times daily.  . ondansetron (ZOFRAN)  4 MG tablet Take 1 tablet (4 mg total) by mouth every 8 (eight) hours as needed for nausea or vomiting.  . pantoprazole (PROTONIX) 40 MG tablet Take 1 tablet (40 mg total) by mouth daily. Take 30-60 min before first meal of the day    Review of Systems  Constitutional: Positive for fatigue. Negative for chills and fever.  Respiratory: Positive for cough, shortness of breath and wheezing. Negative for chest tightness.   Cardiovascular: Negative for chest pain, palpitations and leg swelling.  Musculoskeletal: Positive for arthralgias.    Objective:  BP (!) 120/58 (BP Location: Left Arm, Patient Position: Sitting, Cuff Size: Large)   Pulse (!) 58   Temp 97.8 F (36.6 C) (Oral)   Wt 204 lb (92.5 kg)   SpO2 97%   BMI 33.43 kg/m   Weight: 204 lb (92.5 kg)   BP Readings from Last 3 Encounters:  11/09/17 (!) 120/58  10/26/17 (!) 160/90  10/23/17 (!) 158/80   Wt Readings from Last 3 Encounters:  11/09/17 204 lb (92.5 kg)  10/26/17 201 lb 6.4 oz (91.4 kg)  10/23/17 202 lb (91.6 kg)    Physical Exam  Constitutional: She is oriented to person, place, and time. She appears well-developed and well-nourished. No distress.  Cardiovascular: Normal rate,  regular rhythm and normal heart sounds. Exam reveals no friction rub.  No murmur heard. 1+ bilat LE edema  Pulmonary/Chest: Effort normal. No respiratory distress. She has decreased breath sounds. She has no wheezes. She has rales in the right lower field.  Musculoskeletal:  There is tenderness over bruised area of the left hip, but no obvious bony tenderness.  There is no pain with flexion extension external or internal rotation of the left hip.  Diffuse tenderness anterior and lateral left shoulder.  Abduction is limited to 30 degrees passive or active secondary to pain.  There is tenderness over left elbow, but no obvious ecchymosis.  No effusion of left elbow.  Difficult to assess rotator cuff on the left secondary to pain with  movement. There is pain and weakness with external rotation of shoulder.   Neurological: She is alert and oriented to person, place, and time.  Skin:  Extensive ecchymosis left lateral hip.  Ecchymosis right calf    Psychiatric: Her behavior is normal. Cognition and memory are normal.    Assessment/Plan 1. Cough Increased congestion on lung exam today.  Will cover with doxycycline.  Let us know if any worsening of symptoms.  Encouraged her to take Mucinex at home. - benzonatate (TESSALON) 100 MG capsule; Take 1 capsule (100 mg total) by mouth 3 (three) times daily.  Dispense: 20 capsule; Refill: 0 - methylPREDNISolone acetate (DEPO-MEDROL) injection 80 mg - doxycycline (VIBRA-TABS) 100 MG tablet; Take 1 tablet (100 mg total) by mouth 2 (two) times daily for 7 days.  Dispense: 20 tablet; Refill: 0  2. Acute pain of left shoulder We do not have x-ray available in the office today.  She will return tomorrow for x-ray she prefers not to go to hospital today for this. - DG Shoulder Left; Future  3. Left elbow pain  - DG Elbow Complete Left; Future  4. Hyperthyroid: has fluctuated in past; will plan to recheck in November with blood work she gets from oncology.  We discussed doing physical therapy for fall prevention.  She is agreeable to this, but I will wait order until after x-rays are back since she may end up needing physical therapy for possible shoulder injury.   Micheline Rough, MD

## 2017-11-09 NOTE — Telephone Encounter (Signed)
Alternative medication found. Patient is using OTC Dramamine.

## 2017-11-10 ENCOUNTER — Ambulatory Visit (INDEPENDENT_AMBULATORY_CARE_PROVIDER_SITE_OTHER): Payer: Medicare Other

## 2017-11-10 DIAGNOSIS — M25512 Pain in left shoulder: Secondary | ICD-10-CM

## 2017-11-10 DIAGNOSIS — M25522 Pain in left elbow: Secondary | ICD-10-CM | POA: Diagnosis not present

## 2017-11-11 ENCOUNTER — Emergency Department (HOSPITAL_COMMUNITY)
Admission: EM | Admit: 2017-11-11 | Discharge: 2017-11-12 | Disposition: A | Discharge status: Home / Self Care | Payer: Medicare Other | Attending: Emergency Medicine | Admitting: Emergency Medicine

## 2017-11-11 ENCOUNTER — Ambulatory Visit (INDEPENDENT_AMBULATORY_CARE_PROVIDER_SITE_OTHER): Payer: Medicare Other

## 2017-11-11 ENCOUNTER — Encounter (HOSPITAL_COMMUNITY): Payer: Self-pay

## 2017-11-11 ENCOUNTER — Ambulatory Visit (HOSPITAL_COMMUNITY)
Admission: EM | Admit: 2017-11-11 | Discharge: 2017-11-11 | Disposition: A | Discharge status: Home / Self Care | Payer: Medicare Other | Source: Home / Self Care | Attending: Internal Medicine | Admitting: Internal Medicine

## 2017-11-11 ENCOUNTER — Encounter (HOSPITAL_COMMUNITY): Payer: Self-pay | Admitting: Emergency Medicine

## 2017-11-11 ENCOUNTER — Other Ambulatory Visit: Payer: Self-pay

## 2017-11-11 DIAGNOSIS — J189 Pneumonia, unspecified organism: Secondary | ICD-10-CM

## 2017-11-11 DIAGNOSIS — Z85828 Personal history of other malignant neoplasm of skin: Secondary | ICD-10-CM | POA: Insufficient documentation

## 2017-11-11 DIAGNOSIS — R05 Cough: Secondary | ICD-10-CM | POA: Diagnosis not present

## 2017-11-11 DIAGNOSIS — I10 Essential (primary) hypertension: Secondary | ICD-10-CM | POA: Insufficient documentation

## 2017-11-11 DIAGNOSIS — Z853 Personal history of malignant neoplasm of breast: Secondary | ICD-10-CM | POA: Insufficient documentation

## 2017-11-11 DIAGNOSIS — Z79899 Other long term (current) drug therapy: Secondary | ICD-10-CM | POA: Insufficient documentation

## 2017-11-11 DIAGNOSIS — M7500 Adhesive capsulitis of unspecified shoulder: Secondary | ICD-10-CM | POA: Insufficient documentation

## 2017-11-11 DIAGNOSIS — J181 Lobar pneumonia, unspecified organism: Secondary | ICD-10-CM | POA: Diagnosis not present

## 2017-11-11 DIAGNOSIS — Z7982 Long term (current) use of aspirin: Secondary | ICD-10-CM | POA: Insufficient documentation

## 2017-11-11 DIAGNOSIS — Z87891 Personal history of nicotine dependence: Secondary | ICD-10-CM | POA: Diagnosis not present

## 2017-11-11 HISTORY — DX: Pneumonia, unspecified organism: J18.9

## 2017-11-11 LAB — CBC WITH DIFFERENTIAL/PLATELET
Abs Immature Granulocytes: 0.04 10*3/uL (ref 0.00–0.07)
BASOS PCT: 1 %
Basophils Absolute: 0.1 10*3/uL (ref 0.0–0.1)
EOS ABS: 0.2 10*3/uL (ref 0.0–0.5)
Eosinophils Relative: 2 %
HCT: 38.7 % (ref 36.0–46.0)
Hemoglobin: 12.8 g/dL (ref 12.0–15.0)
Immature Granulocytes: 0 %
Lymphocytes Relative: 13 %
Lymphs Abs: 1.6 10*3/uL (ref 0.7–4.0)
MCH: 32.5 pg (ref 26.0–34.0)
MCHC: 33.1 g/dL (ref 30.0–36.0)
MCV: 98.2 fL (ref 80.0–100.0)
MONO ABS: 1.8 10*3/uL — AB (ref 0.1–1.0)
MONOS PCT: 15 %
NEUTROS PCT: 69 %
Neutro Abs: 8.6 10*3/uL — ABNORMAL HIGH (ref 1.7–7.7)
PLATELETS: 220 10*3/uL (ref 150–400)
RBC: 3.94 MIL/uL (ref 3.87–5.11)
RDW: 12.4 % (ref 11.5–15.5)
WBC: 12.3 10*3/uL — AB (ref 4.0–10.5)
nRBC: 0 % (ref 0.0–0.2)

## 2017-11-11 LAB — COMPREHENSIVE METABOLIC PANEL
ALK PHOS: 82 U/L (ref 38–126)
ALT: 26 U/L (ref 0–44)
ANION GAP: 9 (ref 5–15)
AST: 23 U/L (ref 15–41)
Albumin: 3.5 g/dL (ref 3.5–5.0)
BUN: 15 mg/dL (ref 8–23)
CALCIUM: 9 mg/dL (ref 8.9–10.3)
CO2: 23 mmol/L (ref 22–32)
Chloride: 108 mmol/L (ref 98–111)
Creatinine, Ser: 0.7 mg/dL (ref 0.44–1.00)
GFR calc non Af Amer: >60 mL/min (ref >60)
Glucose, Bld: 119 mg/dL — ABNORMAL HIGH (ref 70–99)
Potassium: 3.9 mmol/L (ref 3.5–5.1)
SODIUM: 140 mmol/L (ref 135–145)
TOTAL PROTEIN: 6.1 g/dL — AB (ref 6.5–8.1)
Total Bilirubin: 1.1 mg/dL (ref 0.3–1.2)

## 2017-11-11 LAB — I-STAT CG4 LACTIC ACID, ED: Lactic Acid, Venous: 1.44 mmol/L (ref 0.5–1.9)

## 2017-11-11 MED ORDER — LIDOCAINE HCL (PF) 1 % IJ SOLN
INTRAMUSCULAR | Status: AC
  Filled 2017-11-11: qty 2

## 2017-11-11 MED ORDER — ALBUTEROL SULFATE (2.5 MG/3ML) 0.083% IN NEBU
INHALATION_SOLUTION | RESPIRATORY_TRACT | Status: AC
  Filled 2017-11-11: qty 3

## 2017-11-11 MED ORDER — ALBUTEROL SULFATE (2.5 MG/3ML) 0.083% IN NEBU
2.5000 mg | INHALATION_SOLUTION | Freq: Once | RESPIRATORY_TRACT | Status: AC
  Administered 2017-11-11: 2.5 mg via RESPIRATORY_TRACT

## 2017-11-11 MED ORDER — CEFTRIAXONE SODIUM 250 MG IJ SOLR
INTRAMUSCULAR | Status: AC
  Filled 2017-11-11: qty 250

## 2017-11-11 MED ORDER — CEFTRIAXONE SODIUM 250 MG IJ SOLR
250.0000 mg | Freq: Once | INTRAMUSCULAR | Status: AC
  Administered 2017-11-11: 250 mg via INTRAMUSCULAR

## 2017-11-11 NOTE — ED Provider Notes (Signed)
Selena [name]    CSN: [id number] Arrival date & time: 11/11/17  1335     History   Chief Complaint Chief Complaint  Patient presents with  . Cough    HPI Selena [name] is a 82 y.o. female.   82 year old female presents with productive cough that is yellow in nature and fever x1 week.  Patient states that she was recently worked up proximal me 3 weeks ago for possible pulmonary embolism due to shortness of breath.  The exam negative however CT exam showed aneurysm on her heart.  Patient states that she was seen and treated for similar symptoms on Tuesday at her primary care doctor where she had an x-ray given doxycycline, Tessalon Perles and a shot of Solu-Medrol..  The family fell approximately 1 week ago while in the member at bedside states that patient had a bowel associates her symptoms without injury.  Approximately 1 week ago on fall and had symptoms have worsened since that time.  Condition is acute in nature.  Condition is made better by nothing.  Condition is made worse by laying down.  Patient denies any relief from prescription medicine given by primary care doctor.     Past Medical History:  Diagnosis Date  . Anemia    PMH  . Atrial fibrillation ([place]) 01/01/2007  . BACK PAIN, UPPER 07/09/2008  . Breast cancer, stage 1 ([place])   . Bruises easily   . Cataract    history  . Complication of anesthesia   . Diverticulitis   . DIVERTICULOSIS, COLON 12/26/2006  . Essential hypertension 10/27/2017  . Follicular non-Hodgkin's lymphoma ([place])   . Goiter    multi-nodular  . Hx of colonoscopy 2009  . LIVER FUNCTION TESTS, ABNORMAL, HX OF 12/26/2007  . Memory loss 05/16/2007  . OSTEOARTHRITIS 12/26/2006   takes Diclofenac daily but has stopped for surgery  . PONV (postoperative nausea and vomiting)   . Skin cancer   . VERTIGO 09/07/2009  . Wears dentures   . Wears glasses   . Wears hearing aid    bilateral    Patient Active Problem List   Diagnosis Date Noted  .  Upper airway cough syndrome 10/27/2017  . Essential hypertension 10/27/2017  . DOE (dyspnea on exertion) 10/26/2017  . Anxiety 09/10/2014  . Insomnia 09/10/2014  . Follicular 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 ([place]) 07/31/2014  . Paraspinal mass 07/16/2014  . Breast cancer of upper-outer quadrant of right female breast ([place]) 03/21/2011  . VERTIGO 09/07/2009  . Backache 07/09/2008  . FATIGUE 03/18/2008  . LFTs abnormal 12/26/2007  . MEMORY LOSS 05/16/2007  . ATRIAL FIBRILLATION 01/01/2007  . DIVERTICULOSIS, COLON 12/26/2006  . Osteoarthritis 12/26/2006    Past Surgical History:  Procedure Laterality Date  . ABDOMINAL HYSTERECTOMY    . ABLATION OF DYSRHYTHMIC FOCUS  2009  . APPENDECTOMY    . AUGMENTATION MAMMAPLASTY Right 2013  . BREAST BIOPSY  2013  . BREAST RECONSTRUCTION  04/14/2011   Procedure: BREAST RECONSTRUCTION;  Surgeon: Crissie Reese, MD;  Location: [facility];  Service: Plastics;  Laterality: Right;  Placement of Right Breast Tissue Expander with  use of Flex HD for Breast Reconstruction  . BREAST SURGERY     right sm, snbx  . CATARACT EXTRACTION W/ INTRAOCULAR LENS  IMPLANT, BILATERAL  2010   bilateral  . COLONOSCOPY    . COLONOSCOPY    . KNEE SURGERY  2004/2010   arthroscopic/ bilateral knee replacements  . MASTECTOMY Right 2013  .  PORT-A-CATH REMOVAL N/A 12/07/2016   Procedure: REMOVAL PORT-A-CATH;  Surgeon: Rolm Bookbinder, MD;  Location: [facility];  Service: General;  Laterality: N/A;  . PORTACATH PLACEMENT Right 08/11/2014   Procedure: INSERTION PORT-A-CATH WITH ULTRASOUND;  Surgeon: Rolm Bookbinder, MD;  Location: [facility];  Service: General;  Laterality: Right;  . [place]   right  . tmi  1998  . TONSILLECTOMY      OB History   None      Home Medications    Prior to Admission medications   Medication Sig Start Date End Date Taking? Authorizing Provider  amLODipine (NORVASC) 5 MG tablet Take 1 tablet (5 mg total) by mouth  daily. 10/23/17   Burnell Blanks, MD  aspirin EC 81 MG tablet Take 81 mg by mouth daily.    [provider]  benzonatate (TESSALON) 100 MG capsule Take 1 capsule (100 mg total) by mouth 3 (three) times daily. 11/09/17   Koberlein, Steele Berg, MD  bisoprolol (ZEBETA) 5 MG tablet TAKE 1 TABLET BY MOUTH DAILY. PILL CAN BE CUT IN HALF IF TOO STRONG 10/27/17   Tanda Rockers, MD  doxycycline (VIBRA-TABS) 100 MG tablet Take 1 tablet (100 mg total) by mouth 2 (two) times daily for 7 days. 11/09/17 11/16/17  Caren Macadam, MD  famotidine (PEPCID) 20 MG tablet One at bedtime 10/26/17   Tanda Rockers, MD  fluticasone [facility]) 50 MCG/ACT nasal spray 1 spray each nostril daily 10/27/17   Koberlein, Steele Berg, MD  meclizine (ANTIVERT) 25 MG tablet Take 1 tablet (25 mg total) by mouth every 6 (six) hours as needed for dizziness. 08/28/17   Marletta Lor, MD  meloxicam (MOBIC) 7.5 MG tablet Take 1 tablet (7.5 mg total) by mouth daily. 09/20/17   Caren Macadam, MD  metoprolol tartrate (LOPRESSOR) 50 MG tablet Take 1 tablet (50 mg total) by mouth 2 (two) times daily. 10/27/17   Tanda Rockers, MD  ondansetron (ZOFRAN) 4 MG tablet Take 1 tablet (4 mg total) by mouth every 8 (eight) hours as needed for nausea or vomiting. 08/23/16   Marletta Lor, MD  pantoprazole (PROTONIX) 40 MG tablet Take 1 tablet (40 mg total) by mouth daily. Take 30-60 min before first meal of the day 10/26/17   Tanda Rockers, MD    Family History Family History  Problem Relation Age of Onset  . Colon cancer Mother   . Cancer Mother        colon  . Colon cancer Maternal Aunt   . Cancer Maternal Uncle   . Prostate cancer Maternal Uncle   . Prostate cancer Maternal Uncle   . Other Maternal Uncle   . Cancer Sister        kidney  . Cancer Brother        lymph node  . Anesthesia problems Neg Hx   . Hypotension Neg Hx   . Malignant hyperthermia Neg Hx   . Pseudochol deficiency Neg Hx   . Breast  cancer Neg Hx     Social History Social History   Tobacco Use  . Smoking status: Former Smoker    Packs/day: 0.10    Years: 20.00    Pack years: 2.00    Types: Cigarettes    Last attempt to quit: 01/24/1978    Years since quitting: 39.8  . Smokeless tobacco: Never Used  Substance Use Topics  . Alcohol use: No  . Drug use: No  Allergies   Codeine phosphate and Codeine   Review of Systems Review of Systems   Physical Exam Triage Vital Signs ED Triage Vitals  Enc Vitals Group     BP 11/11/17 1459 (!) 147/62     Pulse Rate 11/11/17 1459 72     Resp 11/11/17 1459 20     Temp 11/11/17 1459 98.6 F (37 C)     Temp Source 11/11/17 1459 Oral     SpO2 11/11/17 1459 97 %     Weight 11/11/17 1500 204 lb (92.5 kg)     Height --      Head Circumference --      Peak Flow --      Pain Score --      Pain Loc --      Pain Edu? --      Excl. in [place]? --    No data found.  Updated Vital Signs BP (!) 147/62 (BP Location: Right Arm)   Pulse 72   Temp 98.6 F (37 C) (Oral)   Resp 20   Wt 204 lb (92.5 kg)   SpO2 97%   BMI 33.43 kg/m   Visual Acuity Right Eye Distance:   Left Eye Distance:   Bilateral Distance:    Right Eye Near:   Left Eye Near:    Bilateral Near:     Physical Exam   UC Treatments / Results  Labs (all labs ordered are listed, but only abnormal results are displayed) Labs Reviewed - No data to display  EKG None  Radiology Dg Elbow Complete Left  Result Date: 11/10/2017 CLINICAL DATA:  Elbow pain after a fall 5 days ago, initial encounter. EXAM: LEFT ELBOW - COMPLETE 3+ VIEW COMPARISON:  None. FINDINGS: No fracture. There may be a small degenerative joint effusion. Osteophytosis in the elbow joint. IMPRESSION: 1. No acute findings. 2. Mild degenerative changes and probable small degenerative joint effusion. Electronically Signed   By: Lorin Picket M.D.   On: 11/10/2017 13:52   Dg Shoulder Left  Result Date: 11/10/2017 CLINICAL DATA:   Left shoulder and elbow pain after a fall, initial encounter. EXAM: LEFT SHOULDER - 2+ VIEW COMPARISON:  None. FINDINGS: No fracture or dislocation. Degenerative changes in the glenohumeral joint and acromioclavicular joint with mild subacromial spurring. Visualized portion of the left chest is unremarkable. IMPRESSION: Degenerative changes in the left acromioclavicular and glenohumeral joints. No acute findings. Electronically Signed   By: Lorin Picket M.D.   On: 11/10/2017 13:51    Procedures Procedures (including critical care time)  Medications Ordered in UC Medications - No data to display  Initial Impression / Assessment and Plan / UC Course  I have reviewed the triage vital signs and the nursing notes.  Pertinent labs & imaging results that were available during my care of the patient were reviewed by me and considered in my medical decision making (see chart for details).      Final Clinical Impressions(s) / UC Diagnoses   Final diagnoses:  None   Discharge Instructions   None    ED Prescriptions    None     Controlled Substance Prescriptions  Controlled Substance Registry consulted? Not Applicable   Selena Mau, NP 11/11/17 [phone]

## 2017-11-11 NOTE — Discharge Instructions (Addendum)
Continue taking medication prescribed by primary care doctor and follow up with them on Monday. Symptoms worsen necessitate a visit to the emergency room.

## 2017-11-11 NOTE — ED Triage Notes (Signed)
Pt was diagnosed with pna today at urgent care. Told if getting worse to go to the ED. Pt took her temp tonight and it was 100.0. Felt she needed to be checked out because she didn't feel better.

## 2017-11-11 NOTE — ED Triage Notes (Signed)
Pt c/o she has a cough and pt has went to see her PCP. Pt thinks the meds are not strong enough, the cough is worst

## 2017-11-12 MED ORDER — CEFTRIAXONE SODIUM 1 G IJ SOLR
1.0000 g | Freq: Once | INTRAMUSCULAR | Status: AC
  Administered 2017-11-12: 1 g via INTRAMUSCULAR
  Filled 2017-11-12: qty 10

## 2017-11-12 MED ORDER — LIDOCAINE HCL (PF) 1 % IJ SOLN
INTRAMUSCULAR | Status: AC
  Administered 2017-11-12: 02:00:00
  Filled 2017-11-12: qty 5

## 2017-11-12 NOTE — ED Provider Notes (Signed)
[facility] EMERGENCY DEPARTMENT Provider Note  CSN: [id number] Arrival date & time: 11/11/17 2156  Chief Complaint(s) Pneumonia  HPI Selena [name] is a 82 y.o. female with past medical history listed below who was diagnosed with pneumonia earlier today at urgent care.  Patient has been having productive cough for almost a week.  She was seen by her primary care provider who placed her on doxycycline 3 days ago.  She was told to continue the doxycycline at urgent care today and given strict return precautions.  When she went home, she noted that she had an elevated temperature around 100 orally, so decided to come to the emergency department to be evaluated.  She endorses one episode of nausea with coughing but no emesis.  She does endorse fatigue over the past several days but reports that she has been hydrating well.  Denies any abdominal pain or diarrhea.  States that she is been taking her antibiotics as directed.  Currently denies any other physical complaints at this time.    HPI  Past Medical History Past Medical History:  Diagnosis Date  . Anemia    PMH  . Atrial fibrillation ([place]) 01/01/2007  . BACK PAIN, UPPER 07/09/2008  . Breast cancer, stage 1 ([place])   . Bruises easily   . Cataract    history  . Complication of anesthesia   . Diverticulitis   . DIVERTICULOSIS, COLON 12/26/2006  . Essential hypertension 10/27/2017  . Follicular non-Hodgkin's lymphoma ([place])   . Goiter    multi-nodular  . Hx of colonoscopy 2009  . LIVER FUNCTION TESTS, ABNORMAL, HX OF 12/26/2007  . Memory loss 05/16/2007  . OSTEOARTHRITIS 12/26/2006   takes Diclofenac daily but has stopped for surgery  . PONV (postoperative nausea and vomiting)   . Skin cancer   . VERTIGO 09/07/2009  . Wears dentures   . Wears glasses   . Wears hearing aid    bilateral   Patient Active Problem List   Diagnosis Date Noted  . Upper airway cough syndrome 10/27/2017  . Essential hypertension  10/27/2017  . DOE (dyspnea on exertion) 10/26/2017  . Anxiety 09/10/2014  . Insomnia 09/10/2014  . Follicular 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 ([place]) 07/31/2014  . Paraspinal mass 07/16/2014  . Breast cancer of upper-outer quadrant of right female breast ([place]) 03/21/2011  . VERTIGO 09/07/2009  . Backache 07/09/2008  . FATIGUE 03/18/2008  . LFTs abnormal 12/26/2007  . MEMORY LOSS 05/16/2007  . ATRIAL FIBRILLATION 01/01/2007  . DIVERTICULOSIS, COLON 12/26/2006  . Osteoarthritis 12/26/2006   Home Medication(s) Prior to Admission medications   Medication Sig Start Date End Date Taking? Authorizing Provider  amLODipine (NORVASC) 5 MG tablet Take 1 tablet (5 mg total) by mouth daily. 10/23/17   Burnell Blanks, MD  aspirin EC 81 MG tablet Take 81 mg by mouth daily.    [provider]  benzonatate (TESSALON) 100 MG capsule Take 1 capsule (100 mg total) by mouth 3 (three) times daily. 11/09/17   Koberlein, Steele Berg, MD  bisoprolol (ZEBETA) 5 MG tablet TAKE 1 TABLET BY MOUTH DAILY. PILL CAN BE CUT IN HALF IF TOO STRONG 10/27/17   Tanda Rockers, MD  doxycycline (VIBRA-TABS) 100 MG tablet Take 1 tablet (100 mg total) by mouth 2 (two) times daily for 7 days. 11/09/17 11/16/17  Caren Macadam, MD  famotidine (PEPCID) 20 MG tablet One at bedtime 10/26/17   Tanda Rockers, MD  fluticasone [facility]) 50 MCG/ACT  nasal spray 1 spray each nostril daily 10/27/17   Koberlein, Steele Berg, MD  meclizine (ANTIVERT) 25 MG tablet Take 1 tablet (25 mg total) by mouth every 6 (six) hours as needed for dizziness. 08/28/17   Marletta Lor, MD  meloxicam (MOBIC) 7.5 MG tablet Take 1 tablet (7.5 mg total) by mouth daily. 09/20/17   Caren Macadam, MD  metoprolol tartrate (LOPRESSOR) 50 MG tablet Take 1 tablet (50 mg total) by mouth 2 (two) times daily. 10/27/17   Tanda Rockers, MD  ondansetron (ZOFRAN) 4 MG tablet Take 1 tablet (4 mg total) by mouth every 8 (eight) hours as  needed for nausea or vomiting. 08/23/16   Marletta Lor, MD  pantoprazole (PROTONIX) 40 MG tablet Take 1 tablet (40 mg total) by mouth daily. Take 30-60 min before first meal of the day 10/26/17   Tanda Rockers, MD                                                                                                                                    Past Surgical History Past Surgical History:  Procedure Laterality Date  . ABDOMINAL HYSTERECTOMY    . ABLATION OF DYSRHYTHMIC FOCUS  2009  . APPENDECTOMY    . AUGMENTATION MAMMAPLASTY Right 2013  . BREAST BIOPSY  2013  . BREAST RECONSTRUCTION  04/14/2011   Procedure: BREAST RECONSTRUCTION;  Surgeon: Crissie Reese, MD;  Location: [place];  Service: Plastics;  Laterality: Right;  Placement of Right Breast Tissue Expander with  use of Flex HD for Breast Reconstruction  . BREAST SURGERY     right sm, snbx  . CATARACT EXTRACTION W/ INTRAOCULAR LENS  IMPLANT, BILATERAL  2010   bilateral  . COLONOSCOPY    . COLONOSCOPY    . KNEE SURGERY  2004/2010   arthroscopic/ bilateral knee replacements  . MASTECTOMY Right 2013  . PORT-A-CATH REMOVAL N/A 12/07/2016   Procedure: REMOVAL PORT-A-CATH;  Surgeon: Rolm Bookbinder, MD;  Location: [place];  Service: General;  Laterality: N/A;  . PORTACATH PLACEMENT Right 08/11/2014   Procedure: INSERTION PORT-A-CATH WITH ULTRASOUND;  Surgeon: Rolm Bookbinder, MD;  Location: [place];  Service: General;  Laterality: Right;  . [place]   right  . tmi  1998  . TONSILLECTOMY     Family History Family History  Problem Relation Age of Onset  . Colon cancer Mother   . Cancer Mother        colon  . Colon cancer Maternal Aunt   . Cancer Maternal Uncle   . Prostate cancer Maternal Uncle   . Prostate cancer Maternal Uncle   . Other Maternal Uncle   . Cancer Sister        kidney  . Cancer Brother        lymph node  . Anesthesia problems Neg Hx   . Hypotension Neg Hx   . Malignant hyperthermia  Neg Hx    . Pseudochol deficiency Neg Hx   . Breast cancer Neg Hx     Social History Social History   Tobacco Use  . Smoking status: Former Smoker    Packs/day: 0.10    Years: 20.00    Pack years: 2.00    Types: Cigarettes    Last attempt to quit: 01/24/1978    Years since quitting: 39.8  . Smokeless tobacco: Never Used  Substance Use Topics  . Alcohol use: No  . Drug use: No   Allergies Codeine phosphate and Codeine  Review of Systems Review of Systems All other systems are reviewed and are negative for acute change except as noted in the HPI  Physical Exam Vital Signs  I have reviewed the triage vital signs BP (!) 162/74   Pulse 73   Temp 98.3 F (36.8 C) (Oral)   Resp 16   SpO2 95%   Physical Exam  Constitutional: She is oriented to person, place, and time. She appears well-developed and well-nourished. No distress.  HENT:  Head: Normocephalic and atraumatic.  Nose: Nose normal.  Eyes: Pupils are equal, round, and reactive to light. Conjunctivae and EOM are normal. Right eye exhibits no discharge. Left eye exhibits no discharge. No scleral icterus.  Neck: Normal range of motion. Neck supple.  Cardiovascular: Normal rate and regular rhythm. Exam reveals no gallop and no friction rub.  No murmur heard. Pulmonary/Chest: Effort normal. No stridor. No respiratory distress. She has rales in the left upper field and the left middle field.  Abdominal: Soft. She exhibits no distension. There is no tenderness.  Musculoskeletal: She exhibits no edema or tenderness.  Neurological: She is alert and oriented to person, place, and time.  Skin: Skin is warm and dry. No rash noted. She is not diaphoretic. No erythema.  Psychiatric: She has a normal mood and affect.  Vitals reviewed.   ED Results and Treatments Labs (all labs ordered are listed, but only abnormal results are displayed) Labs Reviewed  COMPREHENSIVE METABOLIC PANEL - Abnormal; Notable for the following components:       Result Value   Glucose, Bld 119 (*)    Total Protein 6.1 (*)    All other components within normal limits  CBC WITH DIFFERENTIAL/PLATELET - Abnormal; Notable for the following components:   WBC 12.3 (*)    Neutro Abs 8.6 (*)    Monocytes Absolute 1.8 (*)    All other components within normal limits  I-STAT CG4 LACTIC ACID, ED                                                                                                                         EKG  EKG Interpretation  Date/Time:    Ventricular Rate:    PR Interval:    QRS Duration:   QT Interval:    QTC Calculation:   R Axis:     Text Interpretation:        Radiology Dg  Chest 2 View  Result Date: 11/11/2017 CLINICAL DATA:  Productive cough. EXAM: CHEST - 2 VIEW COMPARISON:  Radiograph of August 11, 2014. FINDINGS: Stable cardiomegaly. No pneumothorax or pleural effusion is noted. New left upper lobe airspace opacity is noted concerning for pneumonia. Stable interstitial densities are noted throughout both lungs which may represent scarring, but acute superimposed edema or inflammation cannot be excluded. Bony thorax is unremarkable. IMPRESSION: New left upper lobe airspace opacity is noted concerning for pneumonia. Otherwise stable interstitial densities are noted which most likely represent scarring. Electronically Signed   By: Marijo Conception, M.D.   On: 11/11/2017 15:41   Pertinent labs & imaging results that were available during my care of the patient were reviewed by me and considered in my medical decision making (see chart for details).  Medications Ordered in ED Medications  cefTRIAXone (ROCEPHIN) injection 1 g (1 g Intramuscular Given 11/12/17 0158)  lidocaine (PF) (XYLOCAINE) 1 % injection (  Given 11/12/17 0220)                                                                                                                                    Procedures Procedures  (including critical care time)  Medical Decision  Making / ED Course I have reviewed the nursing notes for this encounter and the patient's prior records (if available in EHR or on provided paperwork).    Patient is afebrile with stable vital signs.  She is well-appearing and nontoxic.  Not hypoxic.  No evidence of dehydration.  Labs did reveal evidence of leukocytosis.  No significant electrolyte derangements or renal insufficiency.  Chest x-ray at urgent care is in the system and notable for left upper lobe pneumonia.  Patient is on appropriate antibiotics currently.  She has a low port score.  She reports that she is got good support system at home.  Discussed options of discharge home with close monitoring versus being admitted, patient and family chose to be discharged home.  They were given strict return precautions.  She reports that she has a plan to follow-up with her primary care provider on Monday.  Prior to being discharged patient was given IM dose of Rocephin.  The patient is safe for discharge with strict return precautions.   Final Clinical Impression(s) / ED Diagnoses Final diagnoses:  Community acquired pneumonia of left upper lobe of lung ([place])   Disposition: Discharge  Condition: Good  I have discussed the results, Dx and Tx plan with the patient who expressed understanding and agree(s) with the plan. Discharge instructions discussed at great length. The patient was given strict return precautions who verbalized understanding of the instructions. No further questions at time of discharge.    ED Discharge Orders    None       Follow Up: Caren Macadam, MD [address] [place] [address] [phone]  On 11/13/2017 as directed  This chart was dictated using voice recognition software.  Despite best efforts to proofread,  errors can occur which can change the documentation meaning.   Fatima Blank, MD 11/12/17 [phone]

## 2017-11-14 ENCOUNTER — Telehealth: Payer: Self-pay

## 2017-11-14 NOTE — Telephone Encounter (Signed)
Selena Macadam, MD  Rebecca Eaton, [place] like Jaylyne went to ER for evaluation after we saw her; they diagnosed her with pneumonia (as did I) and had her continue her antibiotic. Just make sure she is feeling better at this point and doesn't need anything from [place]? Give me update on any temps and on how shoulder/elbow are feeling. Thanks!  -JK  (go ahead and make tele template so it's documented)

## 2017-11-14 NOTE — Telephone Encounter (Signed)
Spoke with pt, she stated she is feeling much better. Pt states her arm is about the same, it is still very sore. She is unable to move it very much and has to use her other arm to lift it above her head.   Pt has an appointment for 11/15/17

## 2017-11-15 ENCOUNTER — Encounter

## 2017-11-15 ENCOUNTER — Encounter: Payer: Self-pay | Admitting: Family Medicine

## 2017-11-15 ENCOUNTER — Ambulatory Visit: Payer: Medicare Other | Admitting: Family Medicine

## 2017-11-15 VITALS — BP 134/60 | HR 58 | Temp 98.3°F | Ht 65.5 in | Wt 198.7 lb

## 2017-11-15 DIAGNOSIS — J189 Pneumonia, unspecified organism: Secondary | ICD-10-CM

## 2017-11-15 DIAGNOSIS — R059 Cough, unspecified: Secondary | ICD-10-CM

## 2017-11-15 DIAGNOSIS — R05 Cough: Secondary | ICD-10-CM

## 2017-11-15 DIAGNOSIS — J181 Lobar pneumonia, unspecified organism: Secondary | ICD-10-CM

## 2017-11-15 MED ORDER — E-Z SPACER DEVI: 2 refills | Status: DC

## 2017-11-15 MED ORDER — IPRATROPIUM-ALBUTEROL 0.5-2.5 (3) MG/3ML IN SOLN
3.0000 mL | Freq: Once | RESPIRATORY_TRACT | Status: AC
  Administered 2017-11-15: 3 mL via RESPIRATORY_TRACT

## 2017-11-15 MED ORDER — LEVOFLOXACIN 750 MG PO TABS: 750.0000 mg | ORAL_TABLET | Freq: Every day | ORAL | 0 refills | Status: DC

## 2017-11-15 MED ORDER — ALBUTEROL SULFATE HFA 108 (90 BASE) MCG/ACT IN AERS: 2.0000 | INHALATION_SPRAY | Freq: Four times a day (QID) | RESPIRATORY_TRACT | 0 refills | Status: DC | PRN

## 2017-11-15 NOTE — Progress Notes (Signed)
Selena [name] DOB: 01-15-1936 Encounter date: 11/15/2017  This is a 82 y.o. female who presents with Chief Complaint  Patient presents with  . Follow-up    History of present illness:  Ended up going to urgent care on Saturday due to cough. Dx with pneumonia. Went home. After being at home started with chills and temp was 100 so went to ER. Was there from 10pm-3am. Ended up getting Rocephin shot in ER. Had sweats last night; woke up soaking wet. Still coughing a lot; productive of yellow mucous (some thick, white mucous); this morning had some vomiting. Not feeling better after that. Feels SOB with just sitting now. Does feel wheezy.   Still with significant pain in left shoulder. Can't lift well on her own but can lift passively.   Not feeling well.     Allergies  Allergen Reactions  . Codeine Phosphate Other (See Comments)    Hallucinations  . Codeine Other (See Comments)    Hallucinations    Current Meds  Medication Sig  . amLODipine (NORVASC) 5 MG tablet Take 1 tablet (5 mg total) by mouth daily.  [name] aspirin EC 81 MG tablet Take 81 mg by mouth daily.  . benzonatate (TESSALON) 100 MG capsule Take 1 capsule (100 mg total) by mouth 3 (three) times daily.  . bisoprolol (ZEBETA) 5 MG tablet TAKE 1 TABLET BY MOUTH DAILY. PILL CAN BE CUT IN HALF IF TOO STRONG  . famotidine (PEPCID) 20 MG tablet One at bedtime  . fluticasone (FLONASE) 50 MCG/ACT nasal spray 1 spray each nostril daily  . meclizine (ANTIVERT) 25 MG tablet Take 1 tablet (25 mg total) by mouth every 6 (six) hours as needed for dizziness.  . meloxicam (MOBIC) 7.5 MG tablet Take 1 tablet (7.5 mg total) by mouth daily.  . metoprolol tartrate (LOPRESSOR) 50 MG tablet Take 1 tablet (50 mg total) by mouth 2 (two) times daily.  . ondansetron (ZOFRAN) 4 MG tablet Take 1 tablet (4 mg total) by mouth every 8 (eight) hours as needed for nausea or vomiting.  . pantoprazole (PROTONIX) 40 MG tablet Take 1 tablet (40 mg total) by  mouth daily. Take 30-60 min before first meal of the day  . [DISCONTINUED] doxycycline (VIBRA-TABS) 100 MG tablet Take 1 tablet (100 mg total) by mouth 2 (two) times daily for 7 days.    Review of Systems  Constitutional: Positive for activity change (fatigued; not feeling well), appetite change (poor appetite. ), chills, fatigue and unexpected weight change.  Neurological:       Still gets the vertigo on and off; pills are still helpful.     Objective:  BP 134/60 (BP Location: Left Arm, Patient Position: Sitting, Cuff Size: Large)   Pulse (!) 58   Temp 98.3 F (36.8 C) (Oral)   Ht 5' 5.5" (1.664 m)   Wt 198 lb 11.2 oz (90.1 kg)   SpO2 97%   BMI 32.56 kg/m   Weight: 198 lb 11.2 oz (90.1 kg)   BP Readings from Last 3 Encounters:  11/15/17 134/60  11/12/17 (!) 162/74  11/11/17 (!) 147/62   Wt Readings from Last 3 Encounters:  11/15/17 198 lb 11.2 oz (90.1 kg)  11/11/17 204 lb (92.5 kg)  11/09/17 204 lb (92.5 kg)    Physical Exam  Constitutional: She is oriented to person, place, and time. She appears well-developed and well-nourished. No distress.  Cardiovascular: Normal rate, regular rhythm and normal heart sounds. Exam reveals no friction rub.  No  murmur heard. No lower extremity edema  Pulmonary/Chest: Effort normal. No respiratory distress. She has decreased breath sounds (slightly diffuse). She has no wheezes. She has no rhonchi. She has no rales (inferior rales have improved).  Musculoskeletal:  Improved abduction shoulder 30 degress and full passive motion  Neurological: She is alert and oriented to person, place, and time.  Psychiatric: Her behavior is normal. Cognition and memory are normal.    Assessment/Plan  1. Cough No physical improvement with course of doxy. Will stop and switch to levaquin. Follow up phone call in 2 days time. Duoneb in office. She has had clinical improvement during this illness with nebulized treatments. Will send in albuterol haler for  home use with spacing chamber. Let us know if any worsening of sx. Discussed timeline of expected improvement from pnuemonia. Close follow up by phone call/visit if needed. Still may need PT for shoulder but would like to work on lung tx first. - levofloxacin (LEVAQUIN) 750 MG tablet; Take 1 tablet (750 mg total) by mouth daily.  Dispense: 7 tablet; Refill: 0 - albuterol (PROVENTIL HFA;VENTOLIN HFA) 108 (90 Base) MCG/ACT inhaler; Inhale 2 puffs into the lungs every 6 (six) hours as needed for wheezing or shortness of breath.  Dispense: 1 Inhaler; Refill: 0  2. Pneumonia of left upper lobe due to infectious organism [facility]) See above  There are no diagnoses linked to this encounter.       Micheline Rough, MD

## 2017-11-15 NOTE — Telephone Encounter (Signed)
Will discuss with her in office today.

## 2017-11-17 ENCOUNTER — Telehealth: Payer: Self-pay

## 2017-11-17 NOTE — Telephone Encounter (Signed)
Selena Macadam, MD  Rebecca Eaton, CMA        Please check in with her on Friday; how is breathing? How is she feeling

## 2017-11-17 NOTE — Telephone Encounter (Signed)
So happy to hear that! Can you do another follow up call with her next week please since she has been in urgent care, ER, and here x 2 recently. I want to make sure shoulder is getting better. I'm happy to see her again in follow up if needed, but if still having shoulder issues we could consider PT at that time. We had also discussed PT for fall prevention so we can order if she is feeling better and interested in getting this started.   Thanks!

## 2017-11-17 NOTE — Telephone Encounter (Signed)
Spoke with patient she states that cough is almost gone and She has started moving around more. States that she is feeling much better.

## 2017-11-23 NOTE — Telephone Encounter (Signed)
Pt states she is feeling much better. She has finished the antibiotic. States that she doesn't quite have her energy back but other than that she feels great. Stated that her arm is still bothering her and that she has been doing exercise. Says that is slowly improving and she would like to wait one more week and if it has not improved then she will schedule a follow and discuss PT then.

## 2017-11-23 NOTE — Telephone Encounter (Signed)
Great! Thanks for the update.

## 2017-11-26 ENCOUNTER — Other Ambulatory Visit: Payer: Self-pay | Admitting: Internal Medicine

## 2017-11-29 ENCOUNTER — Encounter: Payer: Self-pay | Admitting: Family Medicine

## 2017-11-29 ENCOUNTER — Ambulatory Visit: Payer: Medicare Other | Admitting: Family Medicine

## 2017-11-29 VITALS — BP 120/62 | HR 49 | Temp 97.7°F | Wt 193.8 lb

## 2017-11-29 DIAGNOSIS — I10 Essential (primary) hypertension: Secondary | ICD-10-CM

## 2017-11-29 DIAGNOSIS — M25512 Pain in left shoulder: Secondary | ICD-10-CM

## 2017-11-29 MED ORDER — AMLODIPINE BESYLATE 5 MG PO TABS: 5.0000 mg | ORAL_TABLET | Freq: Every day | ORAL | 1 refills | Status: DC

## 2017-11-29 MED ORDER — METOPROLOL TARTRATE 25 MG PO TABS: 25.0000 mg | ORAL_TABLET | Freq: Two times a day (BID) | ORAL | 1 refills | Status: DC

## 2017-11-29 MED ORDER — PREDNISONE 20 MG PO TABS: ORAL_TABLET | ORAL | 0 refills | Status: DC

## 2017-11-29 NOTE — Patient Instructions (Addendum)
meloxicam is for pain/inflammation if needed (mobic).   OK to try dramamine for dizziness which is over the counter if you run out of the meclizine.   Prednisone burst to help with inflammation and pain in shoulder. It is ok to start with just 2 tabs and see how you feel before adding third tab in this taper. Then continue taper of prednisone as directed. Take with food and in the morning.   Update me if any worsening of shoulder, lack of improvement after a couple of weeks of therapy; or if not better after therapy.

## 2017-11-29 NOTE — Progress Notes (Signed)
Selena [name] DOB: 12/18/1935 Encounter date: 11/29/2017  This is a 82 y.o. female who presents with Chief Complaint  Patient presents with  . Follow-up    pt states arm has not improved, still unable to lift arm     History of present illness:  No more fevers, chills. Still feeling some of her shortness of breath with exertion, but no longer with sitting. Cough is lessening. Still sob with activity.   Still feels sore. Still having pain with movement of right shoulder. Trying to walk up fingers on wall. Trying to use rubber band and working on strengthing, but just hard to lift. Sometimes pops up in shoulder. When she fell on step landed on left elbow and hit left shoulder on wooden support beam. Pain has really stayed the same.    States that she has been out of the norvasc (1 week) and beta blocker (one day). Home blood pressures have been lower in last couple of weeks some around 115/60s and some of HR in 40-50's range. She has had confusion about medications she is staking with recent changes from pulmonology and cardiology.  Allergies  Allergen Reactions  . Codeine Phosphate Other (See Comments)    Hallucinations  . Codeine Other (See Comments)    Hallucinations    Current Meds  Medication Sig  . albuterol (PROVENTIL HFA;VENTOLIN HFA) 108 (90 Base) MCG/ACT inhaler Inhale 2 puffs into the lungs every 6 (six) hours as needed for wheezing or shortness of breath.  [name] aspirin EC 81 MG tablet Take 81 mg by mouth daily.  . famotidine (PEPCID) 20 MG tablet One at bedtime  . fluticasone (FLONASE) 50 MCG/ACT nasal spray 1 spray each nostril daily  . meclizine (ANTIVERT) 25 MG tablet Take 1 tablet (25 mg total) by mouth every 6 (six) hours as needed for dizziness.  . meloxicam (MOBIC) 7.5 MG tablet Take 1 tablet (7.5 mg total) by mouth daily.  . pantoprazole (PROTONIX) 40 MG tablet Take 1 tablet (40 mg total) by mouth daily. Take 30-60 min before first meal of the day  .  Spacer/Aero-Holding Chambers (E-Z SPACER) inhaler Use as instructed    Review of Systems  Constitutional: Negative for chills, fatigue and fever.  Respiratory: Positive for cough (improved). Negative for chest tightness, shortness of breath (improved from previous illness, but still present with exertion) and wheezing.   Cardiovascular: Negative for chest pain, palpitations and leg swelling.  Musculoskeletal: Positive for arthralgias (shoulder; see HPI).  Neurological: Positive for dizziness (still has episodes with vertigo).    Objective:  BP 120/62 (BP Location: Left Arm, Patient Position: Sitting, Cuff Size: Normal)   Pulse (!) 49   Temp 97.7 F (36.5 C) (Oral)   Wt 193 lb 12.8 oz (87.9 kg)   SpO2 98%   BMI 31.76 kg/m   Weight: 193 lb 12.8 oz (87.9 kg)   BP Readings from Last 3 Encounters:  11/29/17 120/62  11/15/17 134/60  11/12/17 (!) 162/74   Wt Readings from Last 3 Encounters:  11/29/17 193 lb 12.8 oz (87.9 kg)  11/15/17 198 lb 11.2 oz (90.1 kg)  11/11/17 204 lb (92.5 kg)    Physical Exam  Constitutional: She is oriented to person, place, and time. She appears well-developed and well-nourished. No distress.  Cardiovascular: Normal rate, regular rhythm and normal heart sounds. Exam reveals no friction rub.  No murmur heard. No lower extremity edema  Pulmonary/Chest: Effort normal. No respiratory distress. She has decreased breath sounds. She has no  wheezes. She has no rales.  Musculoskeletal:  She has pain/difficulty with active abduction left shoulder over 70 degrees. OK with passive ROM. + hawkins; there is some posterior tenderness to palpation of shoulder. Pain with supraspinatus testing that limits exam.   Neurological: She is alert and oriented to person, place, and time.  Psychiatric: Her behavior is normal. Cognition and memory are normal.    Assessment/Plan  1. Essential hypertension We did review her medications today and I went through her list with  her. I have decreased her metoprolol dose to 25 mg BID from 50. Refills of medications sent in today. She will continue to track bp at home. Due to age, dizzy episodes I would like to prevent hypotension/bradycardia while still trying to maintain acceptable bp.  - amLODipine (NORVASC) 5 MG tablet; Take 1 tablet (5 mg total) by mouth daily.  Dispense: 90 tablet; Refill: 1 - metoprolol tartrate (LOPRESSOR) 25 MG tablet; Take 1 tablet (25 mg total) by mouth 2 (two) times daily.  Dispense: 180 tablet; Refill: 1  3. Acute pain of left shoulder Continue with ROM exercises at home. Avoid strengthening/rehab exercises unless further directed by PT. We will try prednisone burst to help with inflammation. She prefers to hold off on seeing specialist (ortho) at this time and try to work on PT first which I think is reasonable. If worsening she will let me know and we can refer at that time.  - Ambulatory referral to Physical Therapy - predniSONE (DELTASONE) 20 MG tablet; Take 3 tabs daily x 2 days, then 2 tabs daily x 3 days, then 1 tab daily x 2 days, then 1/2 tab daily x 2 days.  Dispense: 15 tablet; Refill: 0  Return if symptoms worsen or fail to improve.      Micheline Rough, MD

## 2017-12-04 ENCOUNTER — Ambulatory Visit: Payer: Medicare Other | Admitting: Hematology and Oncology

## 2017-12-04 ENCOUNTER — Other Ambulatory Visit: Payer: Medicare Other

## 2017-12-07 ENCOUNTER — Ambulatory Visit (INDEPENDENT_AMBULATORY_CARE_PROVIDER_SITE_OTHER)
Admission: RE | Admit: 2017-12-07 | Discharge: 2017-12-07 | Disposition: A | Discharge status: Home / Self Care | Payer: Medicare Other | Source: Ambulatory Visit | Attending: Internal Medicine | Admitting: Internal Medicine

## 2017-12-07 ENCOUNTER — Ambulatory Visit: Payer: Medicare Other | Admitting: Internal Medicine

## 2017-12-07 ENCOUNTER — Other Ambulatory Visit: Payer: Self-pay | Admitting: Family Medicine

## 2017-12-07 ENCOUNTER — Encounter: Payer: Self-pay | Admitting: Internal Medicine

## 2017-12-07 ENCOUNTER — Other Ambulatory Visit: Payer: Medicare Other

## 2017-12-07 VITALS — BP 144/64 | HR 48 | Ht 65.5 in | Wt 197.0 lb

## 2017-12-07 DIAGNOSIS — R0609 Other forms of dyspnea: Secondary | ICD-10-CM | POA: Diagnosis not present

## 2017-12-07 DIAGNOSIS — R05 Cough: Secondary | ICD-10-CM

## 2017-12-07 DIAGNOSIS — R059 Cough, unspecified: Secondary | ICD-10-CM

## 2017-12-07 DIAGNOSIS — R058 Other specified cough: Secondary | ICD-10-CM

## 2017-12-07 NOTE — Progress Notes (Signed)
Selena [name], female    DOB: 1935-11-25      MRN: [id number]    Brief patient profile:  70 yowf minimal smoking hx quit in 1980 with dx of breast ca then follicular lymphoma rx from 07/2014 with    Bendamustine and Rituxan day 1, 2 every 4 weeks 6 cycles followed by Rituxan maintenance every 2 months 2 years    last rx was completed in   11/2016  s resp problems then cough /sob since august 2019 so referred to pulmonary clinic 10/26/2017 by Dr  Micheline Rough     10/26/2017  Pulmonary / 1st office eval   Chief Complaint  Patient presents with  . Pulmonary Consult    Referred by Dr. Ethlyn Gallery.  Pt c/o DOE x 2 months. She states she walks alot for exercise and has noticed she can't make as many laps as she used to.    Dyspnea:   onset indolent and pattern is progressive to point of 200 ft flat gives out Cough: esp with voice use / not nocturnal, dry / raspy quality assoc with onset of sob  Sleep: lie flat one pillow ok SABA use: none  rec  Pantoprazole (protonix) 40 mg   Take  30-60 min before first meal of the day and Pepcid (famotidine)  20 mg one @  bedtime until return to office - GERD diet   -  Add :  Try bisoprolol 5 mg daily if tolerates/ insurance covers >did not   12/07/2017  f/u ov/Naomia Lenderman re: sob better, cough much better  Chief Complaint  Patient presents with  . Follow-up    Breathing has improved some. She is using her albuterol inhaler once daily on average.   Dyspnea:  When walks back from garage 200 ft flat = MMRC3 = can't walk 100 yards even at a slow pace at a flat grade s stopping due to sob    Cough: better  Sleeping: flat/ one pillow SABA use: uses each am whether needs it or not  02: none     No obvious day to day or daytime variability or assoc excess/ purulent sputum or mucus plugs or hemoptysis or cp or chest tightness, subjective wheeze or overt sinus or hb symptoms.   Sleeping as above without nocturnal  or early am exacerbation  of respiratory   c/o's or need for noct saba. Also denies any obvious fluctuation of symptoms with weather or environmental changes or other aggravating or alleviating factors except as outlined above   No unusual exposure hx or h/o childhood pna/ asthma or knowledge of premature birth.  Current Allergies, Complete Past Medical History, Past Surgical History, Family History, and Social History were reviewed in [employer] record.  ROS  The following are not active complaints unless bolded Hoarseness, sore throat, dysphagia, dental problems, itching, sneezing,  nasal congestion or discharge of excess mucus or purulent secretions, ear ache,   fever, chills, sweats, unintended wt loss or wt gain, classically pleuritic or exertional cp,  orthopnea pnd or arm/hand swelling  or leg swelling, presyncope, palpitations, abdominal pain, anorexia, nausea, vomiting, diarrhea  or change in bowel habits or change in bladder habits, change in stools or change in urine, dysuria, hematuria,  rash, arthralgias, visual complaints, headache, numbness, weakness or ataxia or problems with walking or coordination,  change in mood or  memory.        Current Meds  Medication Sig  . albuterol (PROVENTIL HFA;VENTOLIN HFA) 108 (90  Base) MCG/ACT inhaler INHALE 2 PUFFS INTO THE LUNGS EVERY 6 HOURS AS NEEDED FOR WHEEZING OR SHORTNESS OF BREATH  . amLODipine (NORVASC) 5 MG tablet Take 1 tablet (5 mg total) by mouth daily.  [name] aspirin EC 81 MG tablet Take 81 mg by mouth daily.  . famotidine (PEPCID) 20 MG tablet One at bedtime  . fluticasone (FLONASE) 50 MCG/ACT nasal spray 1 spray each nostril daily  . meclizine (ANTIVERT) 25 MG tablet Take 1 tablet (25 mg total) by mouth every 6 (six) hours as needed for dizziness.  . meloxicam (MOBIC) 7.5 MG tablet Take 1 tablet (7.5 mg total) by mouth daily.  . metoprolol tartrate (LOPRESSOR) 25 MG tablet Take 1 tablet (25 mg total) by mouth 2 (two) times daily.  . ondansetron (ZOFRAN)  4 MG tablet Take 1 tablet (4 mg total) by mouth every 8 (eight) hours as needed for nausea or vomiting.  . pantoprazole (PROTONIX) 40 MG tablet Take 1 tablet (40 mg total) by mouth daily. Take 30-60 min before first meal of the day              Objective:    amb wf nad   Wt Readings from Last 3 Encounters:  12/07/17 197 lb (89.4 kg)  11/29/17 193 lb 12.8 oz (87.9 kg)  11/15/17 198 lb 11.2 oz (90.1 kg)      Vital signs reviewed - Note on arrival 02 sats  98% on RA        HEENT: Full dentures/ nl turbinates bilaterally, and oropharynx. Nl external ear canals without cough reflex   NECK :  without JVD/Nodes/TM/ nl carotid upstrokes bilaterally   LUNGS: no acc muscle use,  Nl contour chest with min crackles in bases R > L  bilaterally without cough on insp or exp maneuvers   CV:  RRR  no s3 or murmur or increase in P2, and no edema   ABD:  soft and nontender with nl inspiratory excursion in the supine position. No bruits or organomegaly appreciated, bowel sounds nl  MS:  Nl gait/ ext warm without deformities, calf tenderness, cyanosis or clubbing No obvious joint restrictions   SKIN: warm and dry without lesions    NEURO:  alert, approp, nl sensorium with  no motor or cerebellar deficits apparent.                CXR PA and Lateral:   12/07/2017 :    I personally reviewed images and agree with radiology impression as follows:   Chronic lung changes without acute findings. Improved aeration in the lateral left upper lung and both lung bases compared to 11/11/2017. Findings may represent resolved airspace disease or edema.               Assessment                              Essential hypertension Echo 09/07/17  - Left ventricle: The cavity size was normal. Wall thickness was   increased in a pattern of mild LVH. Systolic function was normal.   The estimated ejection fraction was in the range of 55% to 60%.   Wall motion was normal;  there were no regional wall motion   abnormalities. Features are consistent with a pseudonormal left   ventricular filling pattern, with concomitant abnormal relaxation   and increased filling pressure (grade 2 diastolic dysfunction). - Left atrium: The atrium was mildly dilated. Volume/bsa,  ES,   (1-plane Simpson&'s, A2C): 35 ml/m^2   I note bp and pulse elevated today and BNP intermediate/  TSH relatively low with relatively high Free T3 so ? If not borderline hyperthyroid and may benefit either way from low doses of betablockers so suggest a trial bisoprolol 5 mg daily since In the setting of respiratory symptoms of unknown etiology,  It would be preferable to use bystolic, the most beta -1  selective Beta blocker available in sample form, with bisoprolol the most selective generic choice  on the market, at least on a trial basis, to make sure the spillover Beta 2 effects of the less specific Beta blockers are not contributing to this patient's symptoms.

## 2017-12-07 NOTE — Patient Instructions (Signed)
Return as needed for cough or short of breath  Only use your albuterol as a rescue medication to be used if you can't catch your breath by resting or doing a relaxed purse lip breathing pattern.  - The less you use it, the better it will work when you need it. - Ok to use up to 2 puffs  every 4 hours if you must but call for immediate appointment if use goes up over your usual need - Don't leave home without it !!  (think of it like the spare tire for your car)   Please remember to go to the  x-ray department downstairs in the basement  for your tests - we will call you with the results when they are available.       Pulmonary follow up is as needed

## 2017-12-08 ENCOUNTER — Encounter: Payer: Self-pay | Admitting: Internal Medicine

## 2017-12-08 ENCOUNTER — Other Ambulatory Visit: Payer: Medicare Other

## 2017-12-08 ENCOUNTER — Ambulatory Visit: Payer: Medicare Other | Admitting: Hematology and Oncology

## 2017-12-08 NOTE — Progress Notes (Signed)
Spoke with pt and notified of results per Dr. Wert. Pt verbalized understanding and denied any questions. 

## 2017-12-08 NOTE — Assessment & Plan Note (Addendum)
Echo 09/07/17 Left ventricle: The cavity size was normal. Wall thickness was   increased in a pattern of mild LVH. Systolic function was normal.   The estimated ejection fraction was in the range of 55% to 60%.   Wall motion was normal; there were no regional wall motion   abnormalities. Features are consistent with a pseudonormal left   ventricular filling pattern, with concomitant abnormal relaxation   and increased filling pressure (grade 2 diastolic dysfunction). - Left atrium: The atrium was mildly dilated PFT's  10/10/17  FEV1 2.09 (93 % ) ratio 87  p 5 % improvement from saba p nothing prior to study with DLCO  60 % corrects to 83 % for alv volume  -  FVC 2.44 (81%)  10/26/2017   Walked RA x one lap @ 185 stopped due to  Sob at fast pace but no desats   cxr much better vs priors though still shows impressive CM and cough mostly resolved  with rx for gerd so do not rec pursuing w/u unless regresses despite optimal rx for diastolic dysfunction  - will use the walk she does daily from garage to house to monitor doe and f/u here if losing ground.

## 2017-12-08 NOTE — Assessment & Plan Note (Signed)
pfts 10/10/17 s sign airflow obst though the insp loop is somewhat flattened  - Allergy profile 10/26/2017 >  Eos 0.3 /  IgE  6 RAST neg - rx for gerd 10/26/17 > improved 12/07/2017    Improved on empirical rx for gerd so  >>>  rec no change x 3 months then consider wean off and f/u with PCP if stays better, with GI if flares     Comment: Discussed the recent press about ppi's in the context of a statistically significant (but questionably clinically relevant) increase in CRI in pts on ppi vs h2's > bottom line is the lowest dose of ppi that controls   gerd is the right dose and if that dose is zero that's fine esp since h2's are cheaper but would not change rx for 3 months so we have a firm baseline before tapering ppi off and using H2's instead and if can't maintain off PPI then consider GI eval.    Pulmonary f/u is prn    I had an extended summary final  discussion with the patient reviewing all relevant studies completed to date and  lasting 15 to 20 minutes of a 25 minute visit    Each maintenance medication was reviewed in detail including most importantly the difference between maintenance and prns and under what circumstances the prns are to be triggered using an action plan format that is not reflected in the computer generated alphabetically organized AVS.     Please see AVS for specific instructions unique to this visit that I personally wrote and verbalized to the the pt in detail and then reviewed with pt  by my nurse highlighting any  changes in therapy recommended at today's visit to their plan of care.      [name]

## 2017-12-11 ENCOUNTER — Inpatient Hospital Stay: Payer: Medicare Other | Attending: Hematology and Oncology

## 2017-12-11 ENCOUNTER — Telehealth: Payer: Self-pay | Admitting: Hematology and Oncology

## 2017-12-11 ENCOUNTER — Inpatient Hospital Stay: Payer: Medicare Other | Admitting: Hematology and Oncology

## 2017-12-11 DIAGNOSIS — Z79811 Long term (current) use of aromatase inhibitors: Secondary | ICD-10-CM | POA: Insufficient documentation

## 2017-12-11 DIAGNOSIS — Z9011 Acquired absence of right breast and nipple: Secondary | ICD-10-CM

## 2017-12-11 DIAGNOSIS — Z9221 Personal history of antineoplastic chemotherapy: Secondary | ICD-10-CM | POA: Insufficient documentation

## 2017-12-11 DIAGNOSIS — C50411 Malignant neoplasm of upper-outer quadrant of right female breast: Secondary | ICD-10-CM

## 2017-12-11 DIAGNOSIS — Z7982 Long term (current) use of aspirin: Secondary | ICD-10-CM

## 2017-12-11 DIAGNOSIS — C8203 Follicular lymphoma grade I, intra-abdominal lymph nodes: Secondary | ICD-10-CM

## 2017-12-11 DIAGNOSIS — Z79899 Other long term (current) drug therapy: Secondary | ICD-10-CM | POA: Insufficient documentation

## 2017-12-11 DIAGNOSIS — Z170 Estrogen receptor positive status [ER+]: Secondary | ICD-10-CM

## 2017-12-11 LAB — CBC WITH DIFFERENTIAL (CANCER CENTER ONLY)
Abs Immature Granulocytes: 0.07 10*3/uL (ref 0.00–0.07)
Basophils Absolute: 0.1 10*3/uL (ref 0.0–0.1)
Basophils Relative: 1 %
EOS ABS: 0.4 10*3/uL (ref 0.0–0.5)
Eosinophils Relative: 4 %
HEMATOCRIT: 44 % (ref 36.0–46.0)
Hemoglobin: 14.4 g/dL (ref 12.0–15.0)
IMMATURE GRANULOCYTES: 1 %
LYMPHS ABS: 1.2 10*3/uL (ref 0.7–4.0)
Lymphocytes Relative: 11 %
MCH: 32.6 pg (ref 26.0–34.0)
MCHC: 32.7 g/dL (ref 30.0–36.0)
MCV: 99.5 fL (ref 80.0–100.0)
Monocytes Absolute: 1.1 10*3/uL — ABNORMAL HIGH (ref 0.1–1.0)
Monocytes Relative: 10 %
NEUTROS PCT: 73 %
Neutro Abs: 8 10*3/uL — ABNORMAL HIGH (ref 1.7–7.7)
Platelet Count: 137 10*3/uL — ABNORMAL LOW (ref 150–400)
RBC: 4.42 MIL/uL (ref 3.87–5.11)
RDW: 12.7 % (ref 11.5–15.5)
WBC Count: 10.8 10*3/uL — ABNORMAL HIGH (ref 4.0–10.5)
nRBC: 0 % (ref 0.0–0.2)

## 2017-12-11 LAB — CMP (CANCER CENTER ONLY)
ALBUMIN: 3.6 g/dL (ref 3.5–5.0)
ALK PHOS: 92 U/L (ref 38–126)
ALT: 29 U/L (ref 0–44)
AST: 20 U/L (ref 15–41)
Anion gap: 8 (ref 5–15)
BUN: 24 mg/dL — ABNORMAL HIGH (ref 8–23)
CALCIUM: 9.6 mg/dL (ref 8.9–10.3)
CO2: 29 mmol/L (ref 22–32)
CREATININE: 0.74 mg/dL (ref 0.44–1.00)
Chloride: 106 mmol/L (ref 98–111)
GFR, Est AFR Am: >60 mL/min (ref >60)
GFR, Estimated: >60 mL/min (ref >60)
GLUCOSE: 85 mg/dL (ref 70–99)
Potassium: 4.6 mmol/L (ref 3.5–5.1)
SODIUM: 143 mmol/L (ref 135–145)
Total Bilirubin: 0.9 mg/dL (ref 0.3–1.2)
Total Protein: 6.3 g/dL — ABNORMAL LOW (ref 6.5–8.1)

## 2017-12-11 LAB — LACTATE DEHYDROGENASE: LDH: 241 U/L — ABNORMAL HIGH (ref 98–192)

## 2017-12-11 NOTE — Assessment & Plan Note (Signed)
Right breast cancer: Right mastectomy 04/14/2011: 2 foci of invasive ductal carcinoma 0.2 cm, grade 1 with background extensive DCIS, 0/5 lymph nodes negative,focus 1: ER 94%, PR 100%, Ki-67 5%, HER-2 negative: Focus to ER 100% PR 6% Ki-67 29% HER-2 negative Femara 2.5 mg daily from2013- 2018. Because her primary tumor was very small and low risk, we did not recommendextended adjuvant therapy.  Breast Cancer Surveillance: 1. Breast exam 06/05/2017: Right mastectomy 2. Mammogram6/10/2019n abnormalities. Postsurgical changes. Breast Density Category B.  Return to clinic in 1 year followup

## 2017-12-11 NOTE — Telephone Encounter (Signed)
Gave patient avs and calendar.   °

## 2017-12-11 NOTE — Assessment & Plan Note (Signed)
Retroperitoneal mass biopsy left side 90/97/5295: Follicular lymphoma low-grade grade 1-2, flow cytometry positive for CD10, 20, 3, 10, 5, BCL-2, BCl-6, CD21, Ki-67 less than 10%, stage IB (8X 4 cm mass)  Treatment plan:systemic chemotherapy with bendamustine and Rituxan 6 cycles ( From 08/13/2014 to 01/01/2015) followed by Rituxan maintenance every 2 months for 2 years completed 12/06/2016  PET/CT scan 01/16/2015: Interval decrease in size and metabolic activity   I discussed with her that in the future scans will be done on an as-needed basis. There is no role of routine CT scans for surveillance for follicular lymphoma unless the patient has symptoms.

## 2017-12-11 NOTE — Progress Notes (Signed)
Patient Care Team: Caren Macadam, MD as PCP - General (Family Medicine) Burnell Blanks, MD as PCP - Cardiology (Cardiology)  DIAGNOSIS:  Encounter Diagnoses  Name Primary?  . Follicular 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 ([place])   . Malignant neoplasm of upper-outer quadrant of right breast in female, estrogen receptor positive ([place])     SUMMARY OF ONCOLOGIC HISTORY:   Breast cancer of upper-outer quadrant of right female breast ([place])   04/14/2011 Surgery    Right mastectomy: 2 foci of invasive ductal carcinoma 0.2 cm, grade 1 with background extensive DCIS, 0/5 lymph nodes negative,focus 1: ER 94%, PR 100%, Ki-67 5%, HER-2 negative: Focus to ER 100%BR 6% Ki-67 29% HER-2 negative    05/02/2011 -  Anti-estrogen oral therapy    letrozole 2.5 mg daily     Follicular 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 ([name])   07/24/2014 Initial Diagnosis    Retroperitoneal mass biopsy left side: Follicular lymphoma low-grade grade 1-2, flow cytometry positive for CD10, 20, 3, 10, 5, BCL-2, BCl-6, CD21, Ki-67 less than 10%    08/13/2014 - 12/06/2016 Chemotherapy    Bendamustine and Rituxan day 1, 2 every 4 weeks 6 cycles followed by Rituxan maintenance every 2 months 2 years completed 12/06/2016    11/03/2014 Imaging    midpoint of chemotherapy: Moderate improvement in the periaortic lymphadenopathy without complete resolution    01/16/2015 PET scan     interval decrease in size and metabolic activity of the large left retroperitoneal mass , no increase in activity, size 10 mm decreased from 48 mm  SUV 1.9 , no additional hypermetabolic activity     CHIEF COMPLIANT: Surveillance of breast cancer and lymphoma  INTERVAL HISTORY: Selena [name] is a 82 year old with above-mentioned history of breast cancer and follicular lymphoma.  She is here for a six-month follow-up.  She reports that she had pneumonia after she fell at home.  The pneumonia got finally better.   She is gaining her strength back.  REVIEW OF SYSTEMS:   Constitutional: Denies fevers, chills or abnormal weight loss Eyes: Denies blurriness of vision Ears, nose, mouth, throat, and face: Denies mucositis or sore throat Respiratory: Denies cough, dyspnea or wheezes Cardiovascular: Denies palpitation, chest discomfort Gastrointestinal:  Denies nausea, heartburn or change in bowel habits Skin: Denies abnormal skin rashes Lymphatics: Denies new lymphadenopathy or easy bruising Neurological:Denies numbness, tingling or new weaknesses Behavioral/Psych: Mood is stable, no new changes  Extremities: No lower extremity edema   All other systems were reviewed with the patient and are negative.  I have reviewed the past medical history, past surgical history, social history and family history with the patient and they are unchanged from previous note.  ALLERGIES:  is allergic to codeine phosphate and codeine.  MEDICATIONS:  Current Outpatient Medications  Medication Sig Dispense Refill  . amLODipine (NORVASC) 5 MG tablet Take 1 tablet (5 mg total) by mouth daily. 90 tablet 1  . aspirin EC 81 MG tablet Take 81 mg by mouth daily.    . famotidine (PEPCID) 20 MG tablet One at bedtime 30 tablet 11  . meclizine (ANTIVERT) 25 MG tablet Take 1 tablet (25 mg total) by mouth every 6 (six) hours as needed for dizziness. 60 tablet 4  . meloxicam (MOBIC) 7.5 MG tablet Take 1 tablet (7.5 mg total) by mouth daily. 90 tablet 1  . metoprolol tartrate (LOPRESSOR) 25 MG tablet Take 1 tablet (25 mg total) by mouth 2 (two) times daily.  180 tablet 1  . ondansetron (ZOFRAN) 4 MG tablet Take 1 tablet (4 mg total) by mouth every 8 (eight) hours as needed for nausea or vomiting. 20 tablet 0  . pantoprazole (PROTONIX) 40 MG tablet Take 1 tablet (40 mg total) by mouth daily. Take 30-60 min before first meal of the day 30 tablet 2   No current facility-administered medications for this visit.     PHYSICAL  EXAMINATION: ECOG PERFORMANCE STATUS: 1 - Symptomatic but completely ambulatory  Vitals:   12/11/17 1052  BP: 132/83  Pulse: (!) 51  Resp: 16  Temp: 97.9 F (36.6 C)  SpO2: 98%   Filed Weights   12/11/17 1052  Weight: 195 lb 6.4 oz (88.6 kg)    GENERAL:alert, no distress and comfortable SKIN: skin color, texture, turgor are normal, no rashes or significant lesions EYES: normal, Conjunctiva are pink and non-injected, sclera clear OROPHARYNX:no exudate, no erythema and lips, buccal mucosa, and tongue normal  NECK: supple, thyroid normal size, non-tender, without nodularity LYMPH:  no palpable lymphadenopathy in the cervical, axillary or inguinal LUNGS: clear to auscultation and percussion with normal breathing effort HEART: regular rate & rhythm and no murmurs and no lower extremity edema ABDOMEN:abdomen soft, non-tender and normal bowel sounds MUSCULOSKELETAL:no cyanosis of digits and no clubbing  NEURO: alert & oriented x 3 with fluent speech, no focal motor/sensory deficits EXTREMITIES: No lower extremity edema   LABORATORY DATA:  I have reviewed the data as listed CMP Latest Ref Rng & Units 11/11/2017 09/22/2017 08/28/2017  Glucose 70 - 99 mg/dL 119(H) 94 97  BUN 8 - 23 mg/dL '15 16 16  ' Creatinine 0.44 - 1.00 mg/dL 0.70 0.94 0.63  Sodium 135 - 145 mmol/L 140 142 142  Potassium 3.5 - 5.1 mmol/L 3.9 4.1 4.4  Chloride 98 - 111 mmol/L 108 107 105  CO2 22 - 32 mmol/L '23 25 28  ' Calcium 8.9 - 10.3 mg/dL 9.0 9.3 9.7  Total Protein 6.5 - 8.1 g/dL 6.1(L) - 6.2  Total Bilirubin 0.3 - 1.2 mg/dL 1.1 - 0.7  Alkaline Phos 38 - 126 U/L 82 - 91  AST 15 - 41 U/L 23 - 36  ALT 0 - 44 U/L 26 - 44(H)    Lab Results  Component Value Date   WBC 10.8 (H) 12/11/2017   HGB 14.4 12/11/2017   HCT 44.0 12/11/2017   MCV 99.5 12/11/2017   PLT 137 (L) 12/11/2017   NEUTROABS 8.0 (H) 12/11/2017    ASSESSMENT & PLAN:  Follicular 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 Retroperitoneal  mass biopsy left side 92/33/0076: Follicular lymphoma low-grade grade 1-2, flow cytometry positive for CD10, 20, 3, 10, 5, BCL-2, BCl-6, CD21, Ki-67 less than 10%, stage IB (8X 4 cm mass)  Treatment plan:systemic chemotherapy with bendamustine and Rituxan 6 cycles ( From 08/13/2014 to 01/01/2015) followed by Rituxan maintenance every 2 months for 2 years completed 12/06/2016  PET/CT scan 01/16/2015: Interval decrease in size and metabolic activity   I discussed with her that in the future scans will be done on an as-needed basis. There is no role of routine CT scans for surveillance for follicular lymphoma unless the patient has symptoms.  Breast cancer of upper-outer quadrant of right female breast [facility]) Right breast cancer: Right mastectomy 04/14/2011: 2 foci of invasive ductal carcinoma 0.2 cm, grade 1 with background extensive DCIS, 0/5 lymph nodes negative,focus 1: ER 94%, PR 100%, Ki-67 5%, HER-2 negative: Focus to ER 100% PR 6% Ki-67 29% HER-2  negative Femara 2.5 mg daily from2013- 2018. Because her primary tumor was very small and low risk, we did not recommendextended adjuvant therapy.  Breast Cancer Surveillance: 1. Breast exam 06/05/2017: Right mastectomy 2. Mammogram6/10/2019n abnormalities. Postsurgical changes. Breast Density Category B.  Return to clinic in 1 year followup    Orders Placed This Encounter  Procedures  . CBC with Differential (Cancer Center Only)    Standing Status:   Future    Standing Expiration Date:   12/12/2018  . CMP ([place] only)    Standing Status:   Future    Standing Expiration Date:   12/12/2018  . Lactate dehydrogenase (LDH)    Standing Status:   Future    Standing Expiration Date:   12/11/2018   The patient has a good understanding of the overall plan. she agrees with it. she will call with any problems that may develop before the next visit here.   Harriette Ohara, MD 12/11/17

## 2017-12-13 ENCOUNTER — Encounter: Payer: Self-pay | Admitting: Physical Therapy

## 2017-12-13 ENCOUNTER — Other Ambulatory Visit: Payer: Self-pay

## 2017-12-13 ENCOUNTER — Ambulatory Visit: Payer: Medicare Other | Attending: Family Medicine | Admitting: Physical Therapy

## 2017-12-13 DIAGNOSIS — M25512 Pain in left shoulder: Secondary | ICD-10-CM

## 2017-12-13 DIAGNOSIS — M6281 Muscle weakness (generalized): Secondary | ICD-10-CM | POA: Insufficient documentation

## 2017-12-13 NOTE — Therapy (Signed)
[place], [place], [address] Phone: [phone]   Fax:  [phone]  Physical Therapy Evaluation  Patient Details  Name: Selena [name] MRN: [id number] Date of Birth: 06-Dec-1935 Referring Provider (PT): Micheline Rough, MD   Encounter Date: 12/13/2017  PT End of Session - 12/13/17 1426    Visit Number  1    Number of Visits  12    Date for PT Re-Evaluation  01/24/18    Authorization Type  [employer] MCR, add KX at 15 visits    PT Start Time  1325    PT Stop Time  1411    PT Time Calculation (min)  46 min    Activity Tolerance  Patient tolerated treatment well    Behavior During Therapy  [facility] for tasks assessed/performed       Past Medical History:  Diagnosis Date  . Anemia    PMH  . Atrial fibrillation ([place]) 01/01/2007  . BACK PAIN, UPPER 07/09/2008  . Breast cancer, stage 1 ([place])   . Bruises easily   . Cataract    history  . Complication of anesthesia   . Diverticulitis   . DIVERTICULOSIS, COLON 12/26/2006  . Essential hypertension 10/27/2017  . Follicular non-Hodgkin's lymphoma ([place])   . Goiter    multi-nodular  . Hx of colonoscopy 2009  . LIVER FUNCTION TESTS, ABNORMAL, HX OF 12/26/2007  . Memory loss 05/16/2007  . OSTEOARTHRITIS 12/26/2006   takes Diclofenac daily but has stopped for surgery  . PONV (postoperative nausea and vomiting)   . Skin cancer   . VERTIGO 09/07/2009  . Wears dentures   . Wears glasses   . Wears hearing aid    bilateral    Past Surgical History:  Procedure Laterality Date  . ABDOMINAL HYSTERECTOMY    . ABLATION OF DYSRHYTHMIC FOCUS  2009  . APPENDECTOMY    . AUGMENTATION MAMMAPLASTY Right 2013  . BREAST BIOPSY  2013  . BREAST RECONSTRUCTION  04/14/2011   Procedure: BREAST RECONSTRUCTION;  Surgeon: Crissie Reese, MD;  Location: [place];  Service: Plastics;  Laterality: Right;  Placement of Right Breast Tissue Expander with  use of Flex HD for Breast Reconstruction  .  BREAST SURGERY     right sm, snbx  . CATARACT EXTRACTION W/ INTRAOCULAR LENS  IMPLANT, BILATERAL  2010   bilateral  . COLONOSCOPY    . COLONOSCOPY    . KNEE SURGERY  2004/2010   arthroscopic/ bilateral knee replacements  . MASTECTOMY Right 2013  . PORT-A-CATH REMOVAL N/A 12/07/2016   Procedure: REMOVAL PORT-A-CATH;  Surgeon: Rolm Bookbinder, MD;  Location: [place];  Service: General;  Laterality: N/A;  . PORTACATH PLACEMENT Right 08/11/2014   Procedure: INSERTION PORT-A-CATH WITH ULTRASOUND;  Surgeon: Rolm Bookbinder, MD;  Location: [place];  Service: General;  Laterality: Right;  . [place]   right  . tmi  1998  . TONSILLECTOMY      There were no vitals filed for this visit.   Subjective Assessment - 12/13/17 1324    Subjective  Pt. is an 82 y/o female with acute onset shoulder pain secondary to fall on 11/05/17 with impact to left shoulder region. X-rays (-) acute injury. Pt. also had recent bout of pneumonia from which she is now recovered.    Pertinent History  Follicular lymphoma, recent pneumonia, history breast CA s/p surgery    Limitations  House hold activities   reaching ADLs, lifting activities  Diagnostic tests  X-rays    Patient Stated Goals  Improve reaching ability/decrease pain    Currently in Pain?  Yes    Pain Score  6     Pain Location  Shoulder    Pain Orientation  Left    Pain Descriptors / Indicators  Sharp    Pain Type  Acute pain    Pain Onset  More than a month ago    Pain Frequency  Intermittent    Aggravating Factors   worse with reaching, activity    Pain Relieving Factors  rest    Effect of Pain on Daily Activities  Limited ability to use left UE for reaching ADLs such as dressing and bathing, lifting for chores         [facility] PT Assessment - 12/13/17 0001      Assessment   Medical Diagnosis  Acute Shoulder Pain    Referring Provider (PT)  Micheline Rough, MD    Onset Date/Surgical Date  11/05/17    Hand Dominance  Right       Precautions   Precautions  --   recent fall history, cance-has follicular lymphoma     Restrictions   Weight Bearing Restrictions  No      Balance Screen   Has the patient fallen in the past 6 months  Yes    How many times?  2      Elim  --   single story-no stairs   [place] - 2 wheels;Cane - single point;Bedside commode;Shower seat      Prior Function   Level of Independence  Independent with basic ADLs      Cognition   Overall Cognitive Status  Within Functional Limits for tasks assessed      Observation/Other Assessments   Focus on Therapeutic Outcomes (FOTO)   51% Limited      Posture/Postural Control   Posture Comments  forward head, left shoulder/scapula elevated      ROM / Strength   AROM / PROM / Strength  AROM;PROM;Strength      AROM   AROM Assessment Site  Shoulder;Elbow    Right/Left Shoulder  Right;Left    Right Shoulder Extension  40 Degrees    Right Shoulder Flexion  150 Degrees    Right Shoulder ABduction  160 Degrees    Right Shoulder Internal Rotation  --   Reach to T9   Right Shoulder External Rotation  --   50 deg at 0 deg abd   Left Shoulder Extension  40 Degrees    Left Shoulder Flexion  70 Degrees    Left Shoulder ABduction  95 Degrees    Left Shoulder Internal Rotation  --   Reach to T9   Left Shoulder External Rotation  --   40 deg at 0 deg abd   Right/Left Elbow  --   Elbow AROM grossly WNL bilat.     PROM   PROM Assessment Site  Shoulder    Right/Left Shoulder  Left    Left Shoulder Flexion  150 Degrees    Left Shoulder ABduction  150 Degrees    Left Shoulder Internal Rotation  50 Degrees    Left Shoulder External Rotation  90 Degrees      Strength   Strength Assessment Site  Shoulder;Elbow    Right/Left Shoulder  Right;Left    Right  Shoulder Flexion  5/5    Right Shoulder ABduction  5/5    Right Shoulder  Internal Rotation  5/5    Right Shoulder External Rotation  5/5    Left Shoulder Flexion  3-/5    Left Shoulder ABduction  3-/5    Left Shoulder Internal Rotation  5/5    Left Shoulder External Rotation  3-/5    Right/Left Elbow  Right;Left    Right Elbow Flexion  5/5    Right Elbow Extension  5/5    Left Elbow Flexion  5/5    Left Elbow Extension  5/5      Special Tests   Other special tests  Infraspinatus and Drop Arm (+) on left                Objective measurements completed on examination: See above findings.      [facility] Adult PT Treatment/Exercise - 12/13/17 0001      Exercises   Exercises  Shoulder      Shoulder Exercises: Supine   Flexion  --   supine flexion with hands clasped x 10   Other Supine Exercises  wand ER x 10      Shoulder Exercises: Seated   Other Seated Exercises  instructed table slide flexion stretch      Shoulder Exercises: Standing   Other Standing Exercises  pendulums-instructed HEP      Shoulder Exercises: Isometric Strengthening   Flexion  --   1x10 in sitting with RUE resistance   ABduction  --   1x10 in sitting with RUE resistance            PT Education - 12/13/17 1425    Education Details  HEP, shoulder anatomy and potential etiology symptoms, POC    Person(s) Educated  Patient    Methods  Explanation;Demonstration;Verbal cues;Handout    Comprehension  Verbalized understanding;Returned demonstration;Verbal cues required;Tactile cues required       PT Short Term Goals - 12/13/17 1433      PT SHORT TERM GOAL #1   Title  Independent with HEP    Baseline  no HEP    Time  3    Period  Weeks    Status  New      PT SHORT TERM GOAL #2   Title  Increase left shoulder flexion and abduction AROM 10 deg or greater to improve ability to wash hair using both hands    Baseline  70 deg flexion, 95 deg abduction    Time  3    Period  Weeks    Status  New        PT Long Term Goals - 12/13/17 1434      PT LONG TERM  GOAL #1   Title  Improve FOTO to 35% or less limitation due to left shoulder    Baseline  51%    Time  6    Period  Weeks    Status  New    Target Date  01/24/18      PT LONG TERM GOAL #2   Title  Left shoulder strength 4/5 or greater for flexion and abduction to improve ability for lifting for chores and carrying grocery bags    Baseline  3-/5    Time  6    Period  Weeks    Status  New    Target Date  01/24/18      PT LONG TERM GOAL #3   Title  Left shoulder flexion  and abduction AROM to 120 deg or greater to improve ability to wash hair with both hands and place dishes in overhead cabinets    Baseline  70 deg flexion, 95 dge abduction    Time  6    Period  Weeks    Status  New    Target Date  01/24/18      PT LONG TERM GOAL #4   Title  Perform reaching ADLs with left shoulder pain <3/10 at worst    Baseline  6-7/10    Time  6    Period  Weeks    Status  New    Target Date  01/24/18             Plan - 12/13/17 1426    Clinical Impression Statement  Pt. presents with acute onset left shoulder pain secondary to fall with subsequent weakness/decreased AROM with X-rays (-) for fracture. Based on presentation would suspect potential rotator ciuff tear. Pt. would benefit from PT to work on strengthening and ROM to address current functional limitations and improve use left UE for ADLs/IADLs.    History and Personal Factors relevant to plan of care:  fall history, cancer, recent pneumonia    Clinical Presentation  Stable    Clinical Decision Making  Low    Rehab Potential  Fair    Clinical Impairments Affecting Rehab Potential  potential rotator cuff tear, age    PT Frequency  2x / week    PT Duration  6 weeks    PT Treatment/Interventions  ADLs/Self Care Home Management;Cryotherapy;Moist Heat;Passive range of motion;Dry needling;Therapeutic activities;Therapeutic exercise;Patient/family education;Manual techniques;Taping;Vasopneumatic Device;Neuromuscular re-education     PT Next Visit Plan  no estim due to cancer (follicular lymphoma), continue shoulder AAROM and isometrics, progress to AROM and strengthening as tolerated, try scapular retraction if able to tolerate (had pain with this at eval so held for HEP)    PT Home Exercise Plan  pendulums, table slides, supine AAROM for flexion (hands clasped) and ER (wand), seated flexion and abduction isometrics    Consulted and Agree with Plan of Care  Patient       Patient will benefit from skilled therapeutic intervention in order to improve the following deficits and impairments:  Pain, Impaired UE functional use, Decreased strength, Decreased range of motion  Visit Diagnosis: Acute pain of left shoulder  Muscle weakness (generalized)     Problem List Patient Active Problem List   Diagnosis Date Noted  . Upper airway cough syndrome 10/27/2017  . Essential hypertension 10/27/2017  . DOE (dyspnea on exertion) 10/26/2017  . Anxiety 09/10/2014  . Insomnia 09/10/2014  . Follicular 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 ([place]) 07/31/2014  . Paraspinal mass 07/16/2014  . Breast cancer of upper-outer quadrant of right female breast ([place]) 03/21/2011  . VERTIGO 09/07/2009  . Backache 07/09/2008  . FATIGUE 03/18/2008  . LFTs abnormal 12/26/2007  . MEMORY LOSS 05/16/2007  . ATRIAL FIBRILLATION 01/01/2007  . DIVERTICULOSIS, COLON 12/26/2006  . Osteoarthritis 12/26/2006    Beaulah Dinning, PT, DPT 12/13/17 2:41 PM  [facility] [address] [place], [place], [address] Phone: [phone]   Fax:  [phone]  Name: Selena [name] MRN: [id number] Date of Birth: 02-14-1935

## 2017-12-25 ENCOUNTER — Ambulatory Visit: Payer: Medicare Other | Attending: Family Medicine | Admitting: Physical Therapy

## 2017-12-25 DIAGNOSIS — M25512 Pain in left shoulder: Secondary | ICD-10-CM | POA: Insufficient documentation

## 2017-12-25 DIAGNOSIS — M6281 Muscle weakness (generalized): Secondary | ICD-10-CM | POA: Diagnosis present

## 2017-12-25 NOTE — Therapy (Signed)
[place], [place], [address] Phone: [phone]   Fax:  [phone]  Physical Therapy Treatment  Patient Details  Name: Selena [name] MRN: [id number] Date of Birth: 1935/08/25 Referring Provider (PT): Micheline Rough, MD   Encounter Date: 12/25/2017  PT End of Session - 12/25/17 1458    Visit Number  2    Number of Visits  12    Date for PT Re-Evaluation  01/24/18    Authorization Type  [employer] MCR, add KX at 15 visits    PT Start Time  1414    PT Stop Time  1500    PT Time Calculation (min)  46 min    Activity Tolerance  Patient tolerated treatment well    Behavior During Therapy  [facility] for tasks assessed/performed       Past Medical History:  Diagnosis Date  . Anemia    PMH  . Atrial fibrillation ([place]) 01/01/2007  . BACK PAIN, UPPER 07/09/2008  . Breast cancer, stage 1 ([place])   . Bruises easily   . Cataract    history  . Complication of anesthesia   . Diverticulitis   . DIVERTICULOSIS, COLON 12/26/2006  . Essential hypertension 10/27/2017  . Follicular non-Hodgkin's lymphoma ([place])   . Goiter    multi-nodular  . Hx of colonoscopy 2009  . LIVER FUNCTION TESTS, ABNORMAL, HX OF 12/26/2007  . Memory loss 05/16/2007  . OSTEOARTHRITIS 12/26/2006   takes Diclofenac daily but has stopped for surgery  . PONV (postoperative nausea and vomiting)   . Skin cancer   . VERTIGO 09/07/2009  . Wears dentures   . Wears glasses   . Wears hearing aid    bilateral    Past Surgical History:  Procedure Laterality Date  . ABDOMINAL HYSTERECTOMY    . ABLATION OF DYSRHYTHMIC FOCUS  2009  . APPENDECTOMY    . AUGMENTATION MAMMAPLASTY Right 2013  . BREAST BIOPSY  2013  . BREAST RECONSTRUCTION  04/14/2011   Procedure: BREAST RECONSTRUCTION;  Surgeon: Crissie Reese, MD;  Location: [place];  Service: Plastics;  Laterality: Right;  Placement of Right Breast Tissue Expander with  use of Flex HD for Breast Reconstruction  .  BREAST SURGERY     right sm, snbx  . CATARACT EXTRACTION W/ INTRAOCULAR LENS  IMPLANT, BILATERAL  2010   bilateral  . COLONOSCOPY    . COLONOSCOPY    . KNEE SURGERY  2004/2010   arthroscopic/ bilateral knee replacements  . MASTECTOMY Right 2013  . PORT-A-CATH REMOVAL N/A 12/07/2016   Procedure: REMOVAL PORT-A-CATH;  Surgeon: Rolm Bookbinder, MD;  Location: [place];  Service: General;  Laterality: N/A;  . PORTACATH PLACEMENT Right 08/11/2014   Procedure: INSERTION PORT-A-CATH WITH ULTRASOUND;  Surgeon: Rolm Bookbinder, MD;  Location: [place];  Service: General;  Laterality: Right;  . [place]   right  . tmi  1998  . TONSILLECTOMY      There were no vitals filed for this visit.  Subjective Assessment - 12/25/17 1454    Subjective  Pt relays her shoulder feels a little better today    Pertinent History  Follicular lymphoma, recent pneumonia, history breast CA s/p surgery    Limitations  House hold activities    Patient Stated Goals  Improve reaching ability/decrease pain    Currently in Pain?  Yes    Pain Score  5     Pain Location  Shoulder    Pain Orientation  Left    Pain Descriptors / Indicators  Sharp    Pain Type  Acute pain    Pain Onset  More than a month ago    Pain Frequency  Intermittent                       [facility] Adult PT Treatment/Exercise - 12/25/17 0001      Exercises   Exercises  Shoulder      Shoulder Exercises: Supine   External Rotation  AAROM;15 reps    External Rotation Limitations  wand    Flexion  AAROM;15 reps    Flexion Limitations  wand    ABduction  AAROM;15 reps    ABduction Limitations  wand    Other Supine Exercises  supine chest press with wand X 15      Shoulder Exercises: Standing   Other Standing Exercises  pendulums X 20 each plane      Shoulder Exercises: Pulleys   Flexion  3 minutes    Scaption  3 minutes      Shoulder Exercises: Isometric Strengthening   Flexion  5X10"    Flexion  Limitations  standing with towel    External Rotation  5X10"    Internal Rotation  5X10"    ABduction  5X10"      Modalities   Modalities  Moist Heat      Moist Heat Therapy   Number Minutes Moist Heat  8 Minutes    Moist Heat Location  Shoulder             PT Education - 12/25/17 1458    Education Details  HEP review    Person(s) Educated  Patient    Methods  Explanation;Demonstration    Comprehension  Verbalized understanding;Returned demonstration       PT Short Term Goals - 12/13/17 1433      PT SHORT TERM GOAL #1   Title  Independent with HEP    Baseline  no HEP    Time  3    Period  Weeks    Status  New      PT SHORT TERM GOAL #2   Title  Increase left shoulder flexion and abduction AROM 10 deg or greater to improve ability to wash hair using both hands    Baseline  70 deg flexion, 95 deg abduction    Time  3    Period  Weeks    Status  New        PT Long Term Goals - 12/13/17 1434      PT LONG TERM GOAL #1   Title  Improve FOTO to 35% or less limitation due to left shoulder    Baseline  51%    Time  6    Period  Weeks    Status  New    Target Date  01/24/18      PT LONG TERM GOAL #2   Title  Left shoulder strength 4/5 or greater for flexion and abduction to improve ability for lifting for chores and carrying grocery bags    Baseline  3-/5    Time  6    Period  Weeks    Status  New    Target Date  01/24/18      PT LONG TERM GOAL #3   Title  Left shoulder flexion and abduction AROM to 120 deg or greater to improve ability to wash hair with both hands and place dishes in  overhead cabinets    Baseline  70 deg flexion, 95 dge abduction    Time  6    Period  Weeks    Status  New    Target Date  01/24/18      PT LONG TERM GOAL #4   Title  Perform reaching ADLs with left shoulder pain <3/10 at worst    Baseline  6-7/10    Time  6    Period  Weeks    Status  New    Target Date  01/24/18            Plan - 12/25/17 1458    Clinical  Impression Statement  Session focused on AAROM and isometric strengthening today to tolerance. She is most limited with abduction today. AAROM did appear to improve by end of session. Continue POC    Rehab Potential  Fair    Clinical Impairments Affecting Rehab Potential  potential rotator cuff tear, age    PT Frequency  2x / week    PT Duration  6 weeks    PT Treatment/Interventions  ADLs/Self Care Home Management;Cryotherapy;Moist Heat;Passive range of motion;Dry needling;Therapeutic activities;Therapeutic exercise;Patient/family education;Manual techniques;Taping;Vasopneumatic Device;Neuromuscular re-education    PT Next Visit Plan  no estim due to cancer (follicular lymphoma), continue shoulder AAROM and isometrics, progress to AROM and strengthening as tolerated, try scapular retraction if able to tolerate (had pain with this at eval so held for HEP)    PT Home Exercise Plan  pendulums, table slides, supine AAROM for flexion (hands clasped) and ER (wand), seated flexion and abduction isometrics    Consulted and Agree with Plan of Care  Patient       Patient will benefit from skilled therapeutic intervention in order to improve the following deficits and impairments:  Pain, Impaired UE functional use, Decreased strength, Decreased range of motion  Visit Diagnosis: Acute pain of left shoulder  Muscle weakness (generalized)     Problem List Patient Active Problem List   Diagnosis Date Noted  . Upper airway cough syndrome 10/27/2017  . Essential hypertension 10/27/2017  . DOE (dyspnea on exertion) 10/26/2017  . Anxiety 09/10/2014  . Insomnia 09/10/2014  . Follicular 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 ([place]) 07/31/2014  . Paraspinal mass 07/16/2014  . Breast cancer of upper-outer quadrant of right female breast ([place]) 03/21/2011  . VERTIGO 09/07/2009  . Backache 07/09/2008  . FATIGUE 03/18/2008  . LFTs abnormal 12/26/2007  . MEMORY LOSS 05/16/2007  . ATRIAL  FIBRILLATION 01/01/2007  . DIVERTICULOSIS, COLON 12/26/2006  . Osteoarthritis 12/26/2006    Silvestre Mesi 12/25/2017, 3:00 PM  [place], [place], [address] Phone: [phone]   Fax:  [phone]  Name: DENNICE TINDOL MRN: [id number] Date of Birth: June 25, 1935

## 2017-12-28 ENCOUNTER — Ambulatory Visit: Payer: Medicare Other | Admitting: Physical Therapy

## 2017-12-28 DIAGNOSIS — M25512 Pain in left shoulder: Secondary | ICD-10-CM | POA: Diagnosis not present

## 2017-12-28 DIAGNOSIS — M6281 Muscle weakness (generalized): Secondary | ICD-10-CM

## 2017-12-28 NOTE — Therapy (Signed)
[place], [place], [address] Phone: [phone]   Fax:  [phone]  Physical Therapy Treatment  Patient Details  Name: Selena [name] MRN: [id number] Date of Birth: 1935-10-22 Referring Provider (PT): Micheline Rough, MD   Encounter Date: 12/28/2017  PT End of Session - 12/28/17 1411    Visit Number  3    Number of Visits  12    Date for PT Re-Evaluation  01/24/18    Authorization Type  [employer] MCR, add KX at 15 visits    PT Start Time  1330    PT Stop Time  1411    PT Time Calculation (min)  41 min    Activity Tolerance  Patient tolerated treatment well    Behavior During Therapy  [facility] for tasks assessed/performed       Past Medical History:  Diagnosis Date  . Anemia    PMH  . Atrial fibrillation ([place]) 01/01/2007  . BACK PAIN, UPPER 07/09/2008  . Breast cancer, stage 1 ([place])   . Bruises easily   . Cataract    history  . Complication of anesthesia   . Diverticulitis   . DIVERTICULOSIS, COLON 12/26/2006  . Essential hypertension 10/27/2017  . Follicular non-Hodgkin's lymphoma ([place])   . Goiter    multi-nodular  . Hx of colonoscopy 2009  . LIVER FUNCTION TESTS, ABNORMAL, HX OF 12/26/2007  . Memory loss 05/16/2007  . OSTEOARTHRITIS 12/26/2006   takes Diclofenac daily but has stopped for surgery  . PONV (postoperative nausea and vomiting)   . Skin cancer   . VERTIGO 09/07/2009  . Wears dentures   . Wears glasses   . Wears hearing aid    bilateral    Past Surgical History:  Procedure Laterality Date  . ABDOMINAL HYSTERECTOMY    . ABLATION OF DYSRHYTHMIC FOCUS  2009  . APPENDECTOMY    . AUGMENTATION MAMMAPLASTY Right 2013  . BREAST BIOPSY  2013  . BREAST RECONSTRUCTION  04/14/2011   Procedure: BREAST RECONSTRUCTION;  Surgeon: Crissie Reese, MD;  Location: [place];  Service: Plastics;  Laterality: Right;  Placement of Right Breast Tissue Expander with  use of Flex HD for Breast Reconstruction  .  BREAST SURGERY     right sm, snbx  . CATARACT EXTRACTION W/ INTRAOCULAR LENS  IMPLANT, BILATERAL  2010   bilateral  . COLONOSCOPY    . COLONOSCOPY    . KNEE SURGERY  2004/2010   arthroscopic/ bilateral knee replacements  . MASTECTOMY Right 2013  . PORT-A-CATH REMOVAL N/A 12/07/2016   Procedure: REMOVAL PORT-A-CATH;  Surgeon: Rolm Bookbinder, MD;  Location: [place];  Service: General;  Laterality: N/A;  . PORTACATH PLACEMENT Right 08/11/2014   Procedure: INSERTION PORT-A-CATH WITH ULTRASOUND;  Surgeon: Rolm Bookbinder, MD;  Location: [place];  Service: General;  Laterality: Right;  . [place]   right  . tmi  1998  . TONSILLECTOMY      There were no vitals filed for this visit.  Subjective Assessment - 12/28/17 1344    Subjective  Pt relays she has been pulling off paneling at home and her shoulder has been okay with this. She still reports pain if she goes out or back certain ways reaching    Currently in Pain?  Yes    Pain Score  2     Pain Location  Shoulder    Pain Orientation  Left    Pain Descriptors / Indicators  Sore  [facility] PT Assessment - 12/28/17 0001      Assessment   Medical Diagnosis  Acute Shoulder Pain    Referring Provider (PT)  Micheline Rough, MD    Onset Date/Surgical Date  11/05/17    Hand Dominance  Right      AROM   Left Shoulder Flexion  140 Degrees    Left Shoulder ABduction  95 Degrees   scaption plane, limited to 70 deg true abd                  [facility] Adult PT Treatment/Exercise - 12/28/17 0001      Exercises   Exercises  Shoulder      Shoulder Exercises: Supine   External Rotation  AAROM;15 reps    External Rotation Limitations  wand    Flexion  AAROM;15 reps    Flexion Limitations  wand      Shoulder Exercises: Sidelying   External Rotation  Left;15 reps    External Rotation Limitations  needs min A    ABduction  AROM;Left;15 reps      Shoulder Exercises: Standing   External Rotation   Strengthening;Left;15 reps    Theraband Level (Shoulder External Rotation)  Level 1 (Yellow)    External Rotation Limitations  isometric hold at end as she can only get to about neutral due to weakness    Internal Rotation  Strengthening;Left;15 reps    Theraband Level (Shoulder Internal Rotation)  Level 1 (Yellow)    Extension  Both;15 reps    Theraband Level (Shoulder Extension)  Level 1 (Yellow)    Row  Both;20 reps    Theraband Level (Shoulder Row)  Level 1 (Yellow)    Other Standing Exercises  wall ladder X 5 flexion and X 5 abduction      Shoulder Exercises: Pulleys   Flexion  2 minutes    Scaption  2 minutes      Shoulder Exercises: Stretch   Internal Rotation Stretch Limitations  5 sec X 15 reps with strap, arm behind back      Modalities   Modalities  --   declined            PT Education - 12/28/17 1411    Education Details  progessed HEP    Person(s) Educated  Patient    Methods  Explanation;Demonstration;Verbal cues    Comprehension  Verbalized understanding;Returned demonstration       PT Short Term Goals - 12/13/17 1433      PT SHORT TERM GOAL #1   Title  Independent with HEP    Baseline  no HEP    Time  3    Period  Weeks    Status  New      PT SHORT TERM GOAL #2   Title  Increase left shoulder flexion and abduction AROM 10 deg or greater to improve ability to wash hair using both hands    Baseline  70 deg flexion, 95 deg abduction    Time  3    Period  Weeks    Status  New        PT Long Term Goals - 12/13/17 1434      PT LONG TERM GOAL #1   Title  Improve FOTO to 35% or less limitation due to left shoulder    Baseline  51%    Time  6    Period  Weeks    Status  New    Target Date  01/24/18  PT LONG TERM GOAL #2   Title  Left shoulder strength 4/5 or greater for flexion and abduction to improve ability for lifting for chores and carrying grocery bags    Baseline  3-/5    Time  6    Period  Weeks    Status  New    Target Date   01/24/18      PT LONG TERM GOAL #3   Title  Left shoulder flexion and abduction AROM to 120 deg or greater to improve ability to wash hair with both hands and place dishes in overhead cabinets    Baseline  70 deg flexion, 95 dge abduction    Time  6    Period  Weeks    Status  New    Target Date  01/24/18      PT LONG TERM GOAL #4   Title  Perform reaching ADLs with left shoulder pain <3/10 at worst    Baseline  6-7/10    Time  6    Period  Weeks    Status  New    Target Date  01/24/18            Plan - 12/28/17 1412    Clinical Impression Statement  Pt relays HEP is getting easy thus she was progressed today with strenthening and her HEP was updated but she declined print out as she relays she can remember them. She does demonstrate improved AROM today as well as improved pain levels but she continues to have significant ER and abd weakness. PT will continue to progress as able    Rehab Potential  Fair    Clinical Impairments Affecting Rehab Potential  potential rotator cuff tear, age    PT Frequency  2x / week    PT Duration  6 weeks    PT Treatment/Interventions  ADLs/Self Care Home Management;Cryotherapy;Moist Heat;Passive range of motion;Dry needling;Therapeutic activities;Therapeutic exercise;Patient/family education;Manual techniques;Taping;Vasopneumatic Device;Neuromuscular re-education    PT Next Visit Plan  no estim due to cancer (follicular lymphoma), shoulder strength, stabilizaiton, and ROM    PT Home Exercise Plan  Added row and ext and IR and ER standing with yellow Tband X 15-20 ea as well as IR stretch behind back 5 sec X 15 into her HEP, she declined print out of this.     Consulted and Agree with Plan of Care  Patient       Patient will benefit from skilled therapeutic intervention in order to improve the following deficits and impairments:  Pain, Impaired UE functional use, Decreased strength, Decreased range of motion  Visit Diagnosis: Acute pain of left  shoulder  Muscle weakness (generalized)     Problem List Patient Active Problem List   Diagnosis Date Noted  . Upper airway cough syndrome 10/27/2017  . Essential hypertension 10/27/2017  . DOE (dyspnea on exertion) 10/26/2017  . Anxiety 09/10/2014  . Insomnia 09/10/2014  . Follicular 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 ([place]) 07/31/2014  . Paraspinal mass 07/16/2014  . Breast cancer of upper-outer quadrant of right female breast ([place]) 03/21/2011  . VERTIGO 09/07/2009  . Backache 07/09/2008  . FATIGUE 03/18/2008  . LFTs abnormal 12/26/2007  . MEMORY LOSS 05/16/2007  . ATRIAL FIBRILLATION 01/01/2007  . DIVERTICULOSIS, COLON 12/26/2006  . Osteoarthritis 12/26/2006    Debbe Odea, PT,DPT 12/28/2017, 2:16 PM  [facility] [address] [place], [place], [address] Phone: [phone]   Fax:  [phone]  Name: Selena [name]  MRN: [id number] Date of Birth: [birth year]

## 2017-12-28 NOTE — Patient Instructions (Signed)
Added row and ext and IR and ER standing with yellow Tband X 15-20 ea as well as IR stretch behind back 5 sec X 15 into her HEP, she declined print out of this.

## 2017-12-29 ENCOUNTER — Other Ambulatory Visit: Payer: Self-pay | Admitting: Family Medicine

## 2017-12-29 DIAGNOSIS — R059 Cough, unspecified: Secondary | ICD-10-CM

## 2017-12-29 DIAGNOSIS — R05 Cough: Secondary | ICD-10-CM

## 2017-12-30 ENCOUNTER — Other Ambulatory Visit: Payer: Self-pay | Admitting: Family Medicine

## 2018-01-02 ENCOUNTER — Encounter: Payer: Self-pay | Admitting: Physical Therapy

## 2018-01-02 ENCOUNTER — Ambulatory Visit: Payer: Medicare Other | Admitting: Physical Therapy

## 2018-01-02 DIAGNOSIS — M25512 Pain in left shoulder: Secondary | ICD-10-CM

## 2018-01-02 DIAGNOSIS — M6281 Muscle weakness (generalized): Secondary | ICD-10-CM

## 2018-01-02 NOTE — Therapy (Signed)
[place], [place], [address] Phone: [phone]   Fax:  [phone]  Physical Therapy Treatment  Patient Details  Name: Selena [name] MRN: [id number] Date of Birth: 04-Nov-1935 Referring Provider (PT): Micheline Rough, MD   Encounter Date: 01/02/2018  PT End of Session - 01/02/18 1416    Visit Number  4    Number of Visits  12    Date for PT Re-Evaluation  01/24/18    Authorization Type  [employer] MCR, add KX at 15 visits    PT Start Time  1333    PT Stop Time  1424    PT Time Calculation (min)  51 min    Activity Tolerance  Patient tolerated treatment well    Behavior During Therapy  [facility] for tasks assessed/performed       Past Medical History:  Diagnosis Date  . Anemia    PMH  . Atrial fibrillation ([place]) 01/01/2007  . BACK PAIN, UPPER 07/09/2008  . Breast cancer, stage 1 ([place])   . Bruises easily   . Cataract    history  . Complication of anesthesia   . Diverticulitis   . DIVERTICULOSIS, COLON 12/26/2006  . Essential hypertension 10/27/2017  . Follicular non-Hodgkin's lymphoma ([place])   . Goiter    multi-nodular  . Hx of colonoscopy 2009  . LIVER FUNCTION TESTS, ABNORMAL, HX OF 12/26/2007  . Memory loss 05/16/2007  . OSTEOARTHRITIS 12/26/2006   takes Diclofenac daily but has stopped for surgery  . PONV (postoperative nausea and vomiting)   . Skin cancer   . VERTIGO 09/07/2009  . Wears dentures   . Wears glasses   . Wears hearing aid    bilateral    Past Surgical History:  Procedure Laterality Date  . ABDOMINAL HYSTERECTOMY    . ABLATION OF DYSRHYTHMIC FOCUS  2009  . APPENDECTOMY    . AUGMENTATION MAMMAPLASTY Right 2013  . BREAST BIOPSY  2013  . BREAST RECONSTRUCTION  04/14/2011   Procedure: BREAST RECONSTRUCTION;  Surgeon: Crissie Reese, MD;  Location: [place];  Service: Plastics;  Laterality: Right;  Placement of Right Breast Tissue Expander with  use of Flex HD for Breast Reconstruction  .  BREAST SURGERY     right sm, snbx  . CATARACT EXTRACTION W/ INTRAOCULAR LENS  IMPLANT, BILATERAL  2010   bilateral  . COLONOSCOPY    . COLONOSCOPY    . KNEE SURGERY  2004/2010   arthroscopic/ bilateral knee replacements  . MASTECTOMY Right 2013  . PORT-A-CATH REMOVAL N/A 12/07/2016   Procedure: REMOVAL PORT-A-CATH;  Surgeon: Rolm Bookbinder, MD;  Location: [place];  Service: General;  Laterality: N/A;  . PORTACATH PLACEMENT Right 08/11/2014   Procedure: INSERTION PORT-A-CATH WITH ULTRASOUND;  Surgeon: Rolm Bookbinder, MD;  Location: [place];  Service: General;  Laterality: Right;  . [place]   right  . tmi  1998  . TONSILLECTOMY      There were no vitals filed for this visit.  Subjective Assessment - 01/02/18 1338    Subjective  Shoulder has been popping more since last session .  Yesterday started having trouble reaching ( gets weak about 110) with assist able to lift to overhead.      Currently in Pain?  No/denies    Pain Score  --   sometimes gets mild pain   Pain Location  Shoulder    Pain Orientation  Posterior    Pain Descriptors / Indicators  --  pain   Pain Type  Acute pain    Pain Frequency  Intermittent    Aggravating Factors   reaching a certain way,  dressing    Pain Relieving Factors  rest    Effect of Pain on Daily Activities  extra time dressing,  ADL's having trouble reaching today                       [facility] Adult PT Treatment/Exercise - 01/02/18 0001      Shoulder Exercises: Supine   External Rotation  AAROM;15 reps    External Rotation Limitations  wand   AROM flexion 10 x 1 pop noted,   Flexion  AAROM;15 reps    Flexion Limitations  wand    Other Supine Exercises  10 X supine chest press  10 X AROM.      Shoulder Exercises: Seated   Flexion  5 reps   cued   Flexion Limitations  table slides  flexion      Shoulder Exercises: Standing   Internal Rotation  Strengthening;AROM    Theraband Level (Shoulder Internal  Rotation)  --   unable to do ER with band   Row  Both;20 reps    Theraband Level (Shoulder Row)  Level 1 (Yellow)      Shoulder Exercises: Isometric Strengthening   External Rotation  5X5"    External Rotation Limitations  4/10 pain      Moist Heat Therapy   Number Minutes Moist Heat  10 Minutes    Moist Heat Location  Shoulder      Manual Therapy   Manual therapy comments  trigger point release teres  X3 to decrease pain.              PT Education - 01/02/18 1416    Education Details  exercise form    Person(s) Educated  Patient    Methods  Explanation;Demonstration;Tactile cues;Verbal cues    Comprehension  Verbalized understanding;Returned demonstration       PT Short Term Goals - 12/13/17 1433      PT SHORT TERM GOAL #1   Title  Independent with HEP    Baseline  no HEP    Time  3    Period  Weeks    Status  New      PT SHORT TERM GOAL #2   Title  Increase left shoulder flexion and abduction AROM 10 deg or greater to improve ability to wash hair using both hands    Baseline  70 deg flexion, 95 deg abduction    Time  3    Period  Weeks    Status  New        PT Long Term Goals - 12/13/17 1434      PT LONG TERM GOAL #1   Title  Improve FOTO to 35% or less limitation due to left shoulder    Baseline  51%    Time  6    Period  Weeks    Status  New    Target Date  01/24/18      PT LONG TERM GOAL #2   Title  Left shoulder strength 4/5 or greater for flexion and abduction to improve ability for lifting for chores and carrying grocery bags    Baseline  3-/5    Time  6    Period  Weeks    Status  New    Target Date  01/24/18  PT LONG TERM GOAL #3   Title  Left shoulder flexion and abduction AROM to 120 deg or greater to improve ability to wash hair with both hands and place dishes in overhead cabinets    Baseline  70 deg flexion, 95 dge abduction    Time  6    Period  Weeks    Status  New    Target Date  01/24/18      PT LONG TERM GOAL #4    Title  Perform reaching ADLs with left shoulder pain <3/10 at worst    Baseline  6-7/10    Time  6    Period  Weeks    Status  New    Target Date  01/24/18            Plan - 01/02/18 1416    Clinical Impression Statement  Shoulder popped 1X during session.  Patient has seen more of this since last visit.  She initially  Needed AA with seated shoulder flexion.  AROM able at end of session,  ER band  changed to AROM for now at home.    PT Next Visit Plan  no estim due to cancer (follicular lymphoma), shoulder strength, stabilizaiton, and ROM    PT Home Exercise Plan  Added row and ext and IR (AROM) and ER standing with yellow Tband X 15-20 ea as well as IR stretch behind back 5 sec X 15 into her HEP, she declined print out of this.     Consulted and Agree with Plan of Care  Patient       Patient will benefit from skilled therapeutic intervention in order to improve the following deficits and impairments:     Visit Diagnosis: Acute pain of left shoulder  Muscle weakness (generalized)     Problem List Patient Active Problem List   Diagnosis Date Noted  . Upper airway cough syndrome 10/27/2017  . Essential hypertension 10/27/2017  . DOE (dyspnea on exertion) 10/26/2017  . Anxiety 09/10/2014  . Insomnia 09/10/2014  . Follicular 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 ([place]) 07/31/2014  . Paraspinal mass 07/16/2014  . Breast cancer of upper-outer quadrant of right female breast ([place]) 03/21/2011  . VERTIGO 09/07/2009  . Backache 07/09/2008  . FATIGUE 03/18/2008  . LFTs abnormal 12/26/2007  . MEMORY LOSS 05/16/2007  . ATRIAL FIBRILLATION 01/01/2007  . DIVERTICULOSIS, COLON 12/26/2006  . Osteoarthritis 12/26/2006    Brigetta Beckstrom  PTA 01/02/2018, 2:21 PM  [facility] [address] [place], [place], [address] Phone: [phone]   Fax:  [phone]  Name: RAINE BLODGETT MRN: [id number] Date of Birth:  01/29/1935

## 2018-01-04 ENCOUNTER — Ambulatory Visit: Payer: Medicare Other | Admitting: Physical Therapy

## 2018-01-04 DIAGNOSIS — M25512 Pain in left shoulder: Secondary | ICD-10-CM

## 2018-01-04 DIAGNOSIS — M6281 Muscle weakness (generalized): Secondary | ICD-10-CM

## 2018-01-04 NOTE — Therapy (Signed)
[place], [place], [address] Phone: [phone]   Fax:  [phone]  Physical Therapy Treatment  Patient Details  Name: Selena [name] MRN: [id number] Date of Birth: [birth year] Referring Provider (PT): Micheline Rough, MD   Encounter Date: 01/04/2018  PT End of Session - 01/04/18 1427    Visit Number  5    Number of Visits  12    Date for PT Re-Evaluation  01/24/18    Authorization Type  [employer] MCR, add KX at 15 visits    PT Start Time  1330    PT Stop Time  1416    PT Time Calculation (min)  46 min    Activity Tolerance  Patient tolerated treatment well    Behavior During Therapy  [facility] for tasks assessed/performed       Past Medical History:  Diagnosis Date  . Anemia    PMH  . Atrial fibrillation ([place]) 01/01/2007  . BACK PAIN, UPPER 07/09/2008  . Breast cancer, stage 1 ([place])   . Bruises easily   . Cataract    history  . Complication of anesthesia   . Diverticulitis   . DIVERTICULOSIS, COLON 12/26/2006  . Essential hypertension 10/27/2017  . Follicular non-Hodgkin's lymphoma ([place])   . Goiter    multi-nodular  . Hx of colonoscopy 2009  . LIVER FUNCTION TESTS, ABNORMAL, HX OF 12/26/2007  . Memory loss 05/16/2007  . OSTEOARTHRITIS 12/26/2006   takes Diclofenac daily but has stopped for surgery  . PONV (postoperative nausea and vomiting)   . Skin cancer   . VERTIGO 09/07/2009  . Wears dentures   . Wears glasses   . Wears hearing aid    bilateral    Past Surgical History:  Procedure Laterality Date  . ABDOMINAL HYSTERECTOMY    . ABLATION OF DYSRHYTHMIC FOCUS  2009  . APPENDECTOMY    . AUGMENTATION MAMMAPLASTY Right 2013  . BREAST BIOPSY  2013  . BREAST RECONSTRUCTION  04/14/2011   Procedure: BREAST RECONSTRUCTION;  Surgeon: Crissie Reese, MD;  Location: [place];  Service: Plastics;  Laterality: Right;  Placement of Right Breast Tissue Expander with  use of Flex HD for Breast Reconstruction  .  BREAST SURGERY     right sm, snbx  . CATARACT EXTRACTION W/ INTRAOCULAR LENS  IMPLANT, BILATERAL  2010   bilateral  . COLONOSCOPY    . COLONOSCOPY    . KNEE SURGERY  2004/2010   arthroscopic/ bilateral knee replacements  . MASTECTOMY Right 2013  . PORT-A-CATH REMOVAL N/A 12/07/2016   Procedure: REMOVAL PORT-A-CATH;  Surgeon: Rolm Bookbinder, MD;  Location: [place];  Service: General;  Laterality: N/A;  . PORTACATH PLACEMENT Right 08/11/2014   Procedure: INSERTION PORT-A-CATH WITH ULTRASOUND;  Surgeon: Rolm Bookbinder, MD;  Location: [place];  Service: General;  Laterality: Right;  . [place]   right  . tmi  1998  . TONSILLECTOMY      There were no vitals filed for this visit.  Subjective Assessment - 01/04/18 1338    Subjective  Pt relays no pain unless she reaches certain ways then 6 out of 10 at worse    Currently in Pain?  Yes   0-6   Pain Location  Shoulder    Pain Orientation  Left                       [facility] Adult PT Treatment/Exercise - 01/04/18 0001  Exercises   Exercises  Shoulder      Shoulder Exercises: Supine   Flexion  AAROM;20 reps    Flexion Limitations  wand      Shoulder Exercises: Sidelying   ABduction  20 reps;Left      Shoulder Exercises: Standing   External Rotation  Left;10 reps;Theraband    Theraband Level (Shoulder External Rotation)  Level 1 (Yellow)    External Rotation Limitations  isometric hold with walking out and then back to start,     Internal Rotation  Strengthening;Left;20 reps    Theraband Level (Shoulder Internal Rotation)  Level 1 (Yellow)    Flexion  AAROM;15 reps    Flexion Limitations  Pball roll up wall    Extension  Both;20 reps;Theraband    Theraband Level (Shoulder Extension)  Level 1 (Yellow)    Row  Both;20 reps    Theraband Level (Shoulder Row)  Level 1 (Yellow)      Shoulder Exercises: Pulleys   Flexion  2 minutes    Scaption  2 minutes      Shoulder Exercises:  ROM/Strengthening   [place]  flex and scaption X 15 ea      Shoulder Exercises: Isometric Strengthening   External Rotation  5X10"    Internal Rotation  5X10"      Moist Heat Therapy   Number Minutes Moist Heat  8 Minutes    Moist Heat Location  Shoulder               PT Short Term Goals - 12/13/17 1433      PT SHORT TERM GOAL #1   Title  Independent with HEP    Baseline  no HEP    Time  3    Period  Weeks    Status  New      PT SHORT TERM GOAL #2   Title  Increase left shoulder flexion and abduction AROM 10 deg or greater to improve ability to wash hair using both hands    Baseline  70 deg flexion, 95 deg abduction    Time  3    Period  Weeks    Status  New        PT Long Term Goals - 12/13/17 1434      PT LONG TERM GOAL #1   Title  Improve FOTO to 35% or less limitation due to left shoulder    Baseline  51%    Time  6    Period  Weeks    Status  New    Target Date  01/24/18      PT LONG TERM GOAL #2   Title  Left shoulder strength 4/5 or greater for flexion and abduction to improve ability for lifting for chores and carrying grocery bags    Baseline  3-/5    Time  6    Period  Weeks    Status  New    Target Date  01/24/18      PT LONG TERM GOAL #3   Title  Left shoulder flexion and abduction AROM to 120 deg or greater to improve ability to wash hair with both hands and place dishes in overhead cabinets    Baseline  70 deg flexion, 95 dge abduction    Time  6    Period  Weeks    Status  New    Target Date  01/24/18      PT LONG TERM GOAL #4   Title  Perform reaching  ADLs with left shoulder pain <3/10 at worst    Baseline  6-7/10    Time  6    Period  Weeks    Status  New    Target Date  01/24/18            Plan - 01/04/18 1427    Clinical Impression Statement  Pt making some progress with ROM and strength but continues to be limited with weakness into flexon and ER. She also has continued shoulder popping but pain levels appear to be  improving some unless she reaches too far into pain. PT will continue to progress as able.    Rehab Potential  Fair    Clinical Impairments Affecting Rehab Potential  potential rotator cuff tear, age    PT Frequency  2x / week    PT Duration  6 weeks    PT Treatment/Interventions  ADLs/Self Care Home Management;Cryotherapy;Moist Heat;Passive range of motion;Dry needling;Therapeutic activities;Therapeutic exercise;Patient/family education;Manual techniques;Taping;Vasopneumatic Device;Neuromuscular re-education    PT Next Visit Plan  no estim due to cancer (follicular lymphoma), shoulder strength, stabilizaiton, and ROM    PT Home Exercise Plan  Added row and ext and IR (AROM) and ER standing with yellow Tband X 15-20 ea as well as IR stretch behind back 5 sec X 15 into her HEP, she declined print out of this.     Consulted and Agree with Plan of Care  Patient       Patient will benefit from skilled therapeutic intervention in order to improve the following deficits and impairments:  Pain, Impaired UE functional use, Decreased strength, Decreased range of motion  Visit Diagnosis: Acute pain of left shoulder  Muscle weakness (generalized)     Problem List Patient Active Problem List   Diagnosis Date Noted  . Upper airway cough syndrome 10/27/2017  . Essential hypertension 10/27/2017  . DOE (dyspnea on exertion) 10/26/2017  . Anxiety 09/10/2014  . Insomnia 09/10/2014  . Follicular 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 ([place]) 07/31/2014  . Paraspinal mass 07/16/2014  . Breast cancer of upper-outer quadrant of right female breast ([place]) 03/21/2011  . VERTIGO 09/07/2009  . Backache 07/09/2008  . FATIGUE 03/18/2008  . LFTs abnormal 12/26/2007  . MEMORY LOSS 05/16/2007  . ATRIAL FIBRILLATION 01/01/2007  . DIVERTICULOSIS, COLON 12/26/2006  . Osteoarthritis 12/26/2006    Debbe Odea, PT,DPT 01/04/2018, 2:30 PM  [facility] [address] [place], [place], [address] Phone: [phone]   Fax:  [phone]  Name: Selena [name] MRN: [id number] Date of Birth: Feb 26, 1935

## 2018-01-09 ENCOUNTER — Ambulatory Visit: Payer: Medicare Other | Admitting: Physical Therapy

## 2018-01-09 ENCOUNTER — Encounter: Payer: Self-pay | Admitting: Physical Therapy

## 2018-01-09 DIAGNOSIS — M25512 Pain in left shoulder: Secondary | ICD-10-CM

## 2018-01-09 DIAGNOSIS — M6281 Muscle weakness (generalized): Secondary | ICD-10-CM

## 2018-01-09 NOTE — Patient Instructions (Addendum)
     SHOULDER: Abduction (Isometric)  Use wall as resistance. Press arm against pillow. Keep elbow straight. Hold _5__ seconds. _5 to 10__ reps per set, _1__ sets per day.  Extension (Isometric)  Place left bent elbow and back of arm against wall. Press elbow against wall. Hold __5__ seconds. Repeat _5 to 10 ___ times. Do __1__ sessions per day.  Internal Rotation (Isometric)    External Rotation (Isometric)  Place back of left fist against door frame, with elbow bent. Press fist against door frame. Hold __5_ seconds. Repeat ___5 to 10 _ times. Do __1__ sessions per day.  Copyright  VHI. All rights reserved.    Stop external rotation with band ( pulling out)  FOR NOW.

## 2018-01-09 NOTE — Therapy (Signed)
[place], [place], [address] Phone: [phone]   Fax:  [phone]  Physical Therapy Treatment  Patient Details  Name: Selena [name] MRN: [id number] Date of Birth: 1935-07-09 Referring Provider (PT): Micheline Rough, MD   Encounter Date: 01/09/2018  PT End of Session - 01/09/18 1428    Visit Number  6    Number of Visits  12    Date for PT Re-Evaluation  01/24/18    Authorization Type  [employer] MCR, add KX at 15 visits    PT Start Time  1331    PT Stop Time  1418    PT Time Calculation (min)  47 min    Behavior During Therapy  [facility] for tasks assessed/performed       Past Medical History:  Diagnosis Date  . Anemia    PMH  . Atrial fibrillation ([place]) 01/01/2007  . BACK PAIN, UPPER 07/09/2008  . Breast cancer, stage 1 ([place])   . Bruises easily   . Cataract    history  . Complication of anesthesia   . Diverticulitis   . DIVERTICULOSIS, COLON 12/26/2006  . Essential hypertension 10/27/2017  . Follicular non-Hodgkin's lymphoma ([place])   . Goiter    multi-nodular  . Hx of colonoscopy 2009  . LIVER FUNCTION TESTS, ABNORMAL, HX OF 12/26/2007  . Memory loss 05/16/2007  . OSTEOARTHRITIS 12/26/2006   takes Diclofenac daily but has stopped for surgery  . PONV (postoperative nausea and vomiting)   . Skin cancer   . VERTIGO 09/07/2009  . Wears dentures   . Wears glasses   . Wears hearing aid    bilateral    Past Surgical History:  Procedure Laterality Date  . ABDOMINAL HYSTERECTOMY    . ABLATION OF DYSRHYTHMIC FOCUS  2009  . APPENDECTOMY    . AUGMENTATION MAMMAPLASTY Right 2013  . BREAST BIOPSY  2013  . BREAST RECONSTRUCTION  04/14/2011   Procedure: BREAST RECONSTRUCTION;  Surgeon: Crissie Reese, MD;  Location: [place];  Service: Plastics;  Laterality: Right;  Placement of Right Breast Tissue Expander with  use of Flex HD for Breast Reconstruction  . BREAST SURGERY     right sm, snbx  . CATARACT EXTRACTION  W/ INTRAOCULAR LENS  IMPLANT, BILATERAL  2010   bilateral  . COLONOSCOPY    . COLONOSCOPY    . KNEE SURGERY  2004/2010   arthroscopic/ bilateral knee replacements  . MASTECTOMY Right 2013  . PORT-A-CATH REMOVAL N/A 12/07/2016   Procedure: REMOVAL PORT-A-CATH;  Surgeon: Rolm Bookbinder, MD;  Location: [place];  Service: General;  Laterality: N/A;  . PORTACATH PLACEMENT Right 08/11/2014   Procedure: INSERTION PORT-A-CATH WITH ULTRASOUND;  Surgeon: Rolm Bookbinder, MD;  Location: [place];  Service: General;  Laterality: Right;  . [place]   right  . tmi  1998  . TONSILLECTOMY      There were no vitals filed for this visit.  Subjective Assessment - 01/09/18 1337    Subjective  Shoulder pops more lately.  It pops differently at different times.  When I first came to PT I could lift arm up all i wanted.  Not it gets tired  and i can't lift it as much.      Currently in Pain?  No/denies    Pain Score  --   up to 14/10        [facility] PT Assessment - 01/09/18 0001      AROM  Left Shoulder Flexion  120 Degrees    Left Shoulder ABduction  120 Degrees   HESITATES HALF WAY                  [facility] Adult PT Treatment/Exercise - 01/09/18 0001      Shoulder Exercises: Supine   Flexion Limitations  cane 10 X ,  explained why this is easier to do in supine.  active 10 X  total 2 sets,  1 with cane.     Other Supine Exercises  TRICEPS  2 lbs ELBOW SUPPORTED       Shoulder Exercises: Standing   External Rotation Limitations  Unable to do  with yellow band 10 x AROM    Internal Rotation  Strengthening;10 reps    Theraband Level (Shoulder Internal Rotation)  Level 2 (Red)    Internal Rotation Limitations  rolled towel    Extension  Both;20 reps;Theraband    Theraband Level (Shoulder Extension)  Level 2 (Red)    Extension Limitations  no pain    Row  10 reps    Theraband Level (Shoulder Row)  Level 2 (Red)    Row Limitations  blocked hyper extension to decrease  pain      Shoulder Exercises: Pulleys   Flexion  2 minutes   cued AA     Shoulder Exercises: Isometric Strengthening   Extension  5X5"    Extension Limitations  no pain    External Rotation  5X5"    External Rotation Limitations  2/10 prior to cued pain free    ABduction  5X5"    ABduction Limitations  hep             PT Education - 01/09/18 1427    Education Details  HEP, ,  exercise form    Person(s) Educated  Patient    Methods  Explanation;Demonstration;Tactile cues;Verbal cues    Comprehension  Verbalized understanding;Returned demonstration       PT Short Term Goals - 01/09/18 1431      PT SHORT TERM GOAL #1   Title  Independent with HEP    Baseline  moderate cues    Time  3    Period  Weeks    Status  On-going      PT SHORT TERM GOAL #2   Title  Increase left shoulder flexion and abduction AROM 10 deg or greater to improve ability to wash hair using both hands    Baseline  120 flexion and abduction today.  No trouble washing her hair    Time  3    Period  Weeks    Status  Partially Met        PT Long Term Goals - 12/13/17 1434      PT LONG TERM GOAL #1   Title  Improve FOTO to 35% or less limitation due to left shoulder    Baseline  51%    Time  6    Period  Weeks    Status  New    Target Date  01/24/18      PT LONG TERM GOAL #2   Title  Left shoulder strength 4/5 or greater for flexion and abduction to improve ability for lifting for chores and carrying grocery bags    Baseline  3-/5    Time  6    Period  Weeks    Status  New    Target Date  01/24/18      PT LONG TERM GOAL #  3   Title  Left shoulder flexion and abduction AROM to 120 deg or greater to improve ability to wash hair with both hands and place dishes in overhead cabinets    Baseline  70 deg flexion, 95 dge abduction    Time  6    Period  Weeks    Status  New    Target Date  01/24/18      PT LONG TERM GOAL #4   Title  Perform reaching ADLs with left shoulder pain <3/10 at  worst    Baseline  6-7/10    Time  6    Period  Weeks    Status  New    Target Date  01/24/18            Plan - 01/09/18 1428    Clinical Impression Statement  Abduction AROM improved to 120 degrees.  Flexion also 120 degrees,  decreased from 140 at exval.  She feels reaching is getting worse.  Patient has been doing exercises at home that have not been issued and are increasing her pain.  moderate cues given for current HEP. STG#2 partially met.    PT Next Visit Plan  no estim due to cancer (follicular lymphoma), shoulder strength, stabilizaiton, and ROM    PT Home Exercise Plan  Added row and ext and IR (AROM) and ER ( on hold due to pain)standing with yellow Tband X 15-20 ea as well as IR stretch behind back 5 sec X 15 into her HEP, she declined print out of this.  shoulder isometrics.    Consulted and Agree with Plan of Care  Patient       Patient will benefit from skilled therapeutic intervention in order to improve the following deficits and impairments:     Visit Diagnosis: Acute pain of left shoulder  Muscle weakness (generalized)     Problem List Patient Active Problem List   Diagnosis Date Noted  . Upper airway cough syndrome 10/27/2017  . Essential hypertension 10/27/2017  . DOE (dyspnea on exertion) 10/26/2017  . Anxiety 09/10/2014  . Insomnia 09/10/2014  . Follicular 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 ([place]) 07/31/2014  . Paraspinal mass 07/16/2014  . Breast cancer of upper-outer quadrant of right female breast ([place]) 03/21/2011  . VERTIGO 09/07/2009  . Backache 07/09/2008  . FATIGUE 03/18/2008  . LFTs abnormal 12/26/2007  . MEMORY LOSS 05/16/2007  . ATRIAL FIBRILLATION 01/01/2007  . DIVERTICULOSIS, COLON 12/26/2006  . Osteoarthritis 12/26/2006    HARRIS,KAREN  PTA 01/09/2018, 2:33 PM  [facility] [address] [place], [place], [address] Phone: [phone]   Fax:   [phone]  Name: Selena [name] MRN: [id number] Date of Birth: [birth year]

## 2018-01-11 ENCOUNTER — Telehealth: Payer: Self-pay | Admitting: Family Medicine

## 2018-01-11 ENCOUNTER — Ambulatory Visit: Payer: Medicare Other | Admitting: Physical Therapy

## 2018-01-11 DIAGNOSIS — M25519 Pain in unspecified shoulder: Secondary | ICD-10-CM

## 2018-01-11 DIAGNOSIS — M6281 Muscle weakness (generalized): Secondary | ICD-10-CM

## 2018-01-11 DIAGNOSIS — M25512 Pain in left shoulder: Secondary | ICD-10-CM | POA: Diagnosis not present

## 2018-01-11 NOTE — Telephone Encounter (Signed)
Copied from [place] [id number]. Topic: Referral - Question >> Jan 11, 2018  2:35 PM Keene Breath wrote: Reason for CRM: Patient called to inform the doctor that she finished her therapy for her shoulder and they recommended that she get a referral to an ortho doctor.  Patient would like a referral to [employer] if possible.  Please advise and call patient back at [phone].

## 2018-01-11 NOTE — Therapy (Signed)
[place], [place], [address] Phone: [phone]   Fax:  [phone]  Physical Therapy Treatment/Discharge  Patient Details  Name: JOI LEYVA MRN: [id number] Date of Birth: 19-Jan-1936 Referring Provider (PT): Micheline Rough, MD   Encounter Date: 01/11/2018  PT End of Session - 01/11/18 1404    Visit Number  7    Number of Visits  12    Date for PT Re-Evaluation  01/24/18    Authorization Type  [employer] MCR, add KX at 15 visits    PT Start Time  0130    PT Stop Time  0200    PT Time Calculation (min)  30 min    Activity Tolerance  Patient tolerated treatment well    Behavior During Therapy  [facility] for tasks assessed/performed       Past Medical History:  Diagnosis Date  . Anemia    PMH  . Atrial fibrillation ([place]) 01/01/2007  . BACK PAIN, UPPER 07/09/2008  . Breast cancer, stage 1 ([place])   . Bruises easily   . Cataract    history  . Complication of anesthesia   . Diverticulitis   . DIVERTICULOSIS, COLON 12/26/2006  . Essential hypertension 10/27/2017  . Follicular non-Hodgkin's lymphoma ([place])   . Goiter    multi-nodular  . Hx of colonoscopy 2009  . LIVER FUNCTION TESTS, ABNORMAL, HX OF 12/26/2007  . Memory loss 05/16/2007  . OSTEOARTHRITIS 12/26/2006   takes Diclofenac daily but has stopped for surgery  . PONV (postoperative nausea and vomiting)   . Skin cancer   . VERTIGO 09/07/2009  . Wears dentures   . Wears glasses   . Wears hearing aid    bilateral    Past Surgical History:  Procedure Laterality Date  . ABDOMINAL HYSTERECTOMY    . ABLATION OF DYSRHYTHMIC FOCUS  2009  . APPENDECTOMY    . AUGMENTATION MAMMAPLASTY Right 2013  . BREAST BIOPSY  2013  . BREAST RECONSTRUCTION  04/14/2011   Procedure: BREAST RECONSTRUCTION;  Surgeon: Crissie Reese, MD;  Location: [place];  Service: Plastics;  Laterality: Right;  Placement of Right Breast Tissue Expander with  use of Flex HD for Breast  Reconstruction  . BREAST SURGERY     right sm, snbx  . CATARACT EXTRACTION W/ INTRAOCULAR LENS  IMPLANT, BILATERAL  2010   bilateral  . COLONOSCOPY    . COLONOSCOPY    . KNEE SURGERY  2004/2010   arthroscopic/ bilateral knee replacements  . MASTECTOMY Right 2013  . PORT-A-CATH REMOVAL N/A 12/07/2016   Procedure: REMOVAL PORT-A-CATH;  Surgeon: Rolm Bookbinder, MD;  Location: [place];  Service: General;  Laterality: N/A;  . PORTACATH PLACEMENT Right 08/11/2014   Procedure: INSERTION PORT-A-CATH WITH ULTRASOUND;  Surgeon: Rolm Bookbinder, MD;  Location: [place];  Service: General;  Laterality: Right;  . [place]   right  . tmi  1998  . TONSILLECTOMY      There were no vitals filed for this visit.  Subjective Assessment - 01/11/18 1359    Subjective  Pt relays her shoulder is not doing good and she feels that it is getting worse with PT. She relays she can not lift it as much, not she cant lay of her shoulder, and she relays it continues to pop.     Currently in Pain?  Yes    Pain Score  --   no pain at rest 10/10 if she uses it a certain way  across her body or OH   Pain Location  Shoulder    Pain Orientation  Left         [facility] PT Assessment - 01/11/18 0001      Assessment   Medical Diagnosis  Acute Shoulder Pain    Referring Provider (PT)  Micheline Rough, MD    Onset Date/Surgical Date  11/05/17    Hand Dominance  Right      AROM   Left Shoulder Flexion  120 Degrees    Left Shoulder ABduction  120 Degrees    Left Shoulder Internal Rotation  --   WNL   Left Shoulder External Rotation  --   [facility]     Strength   Left Shoulder Flexion  3+/5    Left Shoulder ABduction  3+/5    Left Shoulder Internal Rotation  4+/5    Left Shoulder External Rotation  3/5                   [facility] Adult PT Treatment/Exercise - 01/11/18 0001      Shoulder Exercises: Pulleys   Flexion  3 minutes    [place]  3 minutes             PT Education -  01/11/18 1403    Education Details  HEP review, plan to hold PT and refer back to MD for orthopedic MD evaluation, suspected RTC tear due to symptoms    Person(s) Educated  Patient    Methods  Explanation    Comprehension  Verbalized understanding       PT Short Term Goals - 01/11/18 1407      PT SHORT TERM GOAL #1   Title  Independent with HEP    Time  3    Period  Weeks    Status  Achieved      PT SHORT TERM GOAL #2   Title  Increase left shoulder flexion and abduction AROM 10 deg or greater to improve ability to wash hair using both hands    Baseline  120 flexion and abduction today.  No trouble washing her hair    Time  3    Period  Weeks    Status  Partially Met        PT Long Term Goals - 01/11/18 1407      PT LONG TERM GOAL #1   Title  Improve FOTO to 35% or less limitation due to left shoulder    Baseline  41%    Time  6    Period  Days    Status  Not Met      PT LONG TERM GOAL #2   Title  Left shoulder strength 4/5 or greater for flexion and abduction to improve ability for lifting for chores and carrying grocery bags    Baseline  3 to 3+    Time  6    Period  Weeks    Status  Not Met      PT LONG TERM GOAL #3   Title  Left shoulder flexion and abduction AROM to 120 deg or greater to improve ability to wash hair with both hands and place dishes in overhead cabinets    Baseline  90 deg    Time  6    Period  Weeks    Status  Not Met      PT LONG TERM GOAL #4   Title  Perform reaching ADLs with left shoulder pain <3/10 at worst  Baseline  6-7/10    Time  6    Period  Weeks    Status  Not Met            Plan - 01/11/18 1404    Clinical Impression Statement  Session today spent on updating measurments and discussing progress. She is not progressing with PT and is actually regressing with ROM, strength, and pain. PT suspects RTC tear due to her extensive weakness and due to her subacromial spurring noted on x-ray PT may be futher irritating this.  PT will be held and she will be Discharged back to PCP and PT is advising orthopedic MD evaluation.     Rehab Potential  Fair    Clinical Impairments Affecting Rehab Potential  potential rotator cuff tear, age    82 and Agree with Plan of Care  Patient       Patient will benefit from skilled therapeutic intervention in order to improve the following deficits and impairments:  Pain, Impaired UE functional use, Decreased strength, Decreased range of motion  Visit Diagnosis: Acute pain of left shoulder  Muscle weakness (generalized)     Problem List Patient Active Problem List   Diagnosis Date Noted  . Upper airway cough syndrome 10/27/2017  . Essential hypertension 10/27/2017  . DOE (dyspnea on exertion) 10/26/2017  . Anxiety 09/10/2014  . Insomnia 09/10/2014  . Follicular 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 ([place]) 07/31/2014  . Paraspinal mass 07/16/2014  . Breast cancer of upper-outer quadrant of right female breast ([place]) 03/21/2011  . VERTIGO 09/07/2009  . Backache 07/09/2008  . FATIGUE 03/18/2008  . LFTs abnormal 12/26/2007  . MEMORY LOSS 05/16/2007  . ATRIAL FIBRILLATION 01/01/2007  . DIVERTICULOSIS, COLON 12/26/2006  . Osteoarthritis 12/26/2006    Silvestre Mesi 01/11/2018, 2:12 PM  [facility] [address] [place], [place], [address] Phone: [phone]   Fax:  [phone]  Name: DARLY MASSI MRN: [id number] Date of Birth: 11/06/1935  PHYSICAL THERAPY DISCHARGE SUMMARY  Visits from Start of Care: 7  Current functional level related to goals / functional outcomes: See above   Remaining deficits: Shoulder weakness, decreased ROM, continued popping and pain    Education / Equipment: HEP Plan: Patient agrees to discharge.  Patient goals were not met. Patient is being discharged due to lack of progress.  ?????    PT recommending referral for Ortho MD evaluation. Elsie Ra,  PT, DPT 01/11/18 2:13 PM

## 2018-01-12 NOTE — Telephone Encounter (Signed)
Absolutely ok to put in order! Please do so and let her know.

## 2018-01-12 NOTE — Telephone Encounter (Signed)
Referral has been placed. Lm making patient aware

## 2018-01-12 NOTE — Telephone Encounter (Signed)
What diagnosis would you like for the referral?

## 2018-01-12 NOTE — Telephone Encounter (Signed)
Shoulder pain

## 2018-01-15 ENCOUNTER — Ambulatory Visit: Payer: Medicare Other | Admitting: Physical Therapy

## 2018-01-19 ENCOUNTER — Ambulatory Visit: Payer: Medicare Other | Admitting: Physical Therapy

## 2018-01-21 ENCOUNTER — Other Ambulatory Visit: Payer: Self-pay | Admitting: Internal Medicine

## 2018-01-21 DIAGNOSIS — R0609 Other forms of dyspnea: Principal | ICD-10-CM

## 2018-01-22 ENCOUNTER — Ambulatory Visit (INDEPENDENT_AMBULATORY_CARE_PROVIDER_SITE_OTHER): Payer: Medicare Other | Admitting: Orthopaedic Surgery

## 2018-01-22 ENCOUNTER — Encounter (INDEPENDENT_AMBULATORY_CARE_PROVIDER_SITE_OTHER): Payer: Self-pay | Admitting: Orthopaedic Surgery

## 2018-01-22 DIAGNOSIS — M25512 Pain in left shoulder: Secondary | ICD-10-CM | POA: Diagnosis not present

## 2018-01-22 MED ORDER — METHYLPREDNISOLONE ACETATE 40 MG/ML IJ SUSP
40.0000 mg | INTRAMUSCULAR | Status: AC | PRN
  Administered 2018-01-22: 40 mg via INTRA_ARTICULAR

## 2018-01-22 MED ORDER — LIDOCAINE HCL 1 % IJ SOLN
3.0000 mL | INTRAMUSCULAR | Status: AC | PRN
  Administered 2018-01-22: 3 mL

## 2018-01-22 NOTE — Progress Notes (Signed)
Office Visit Note   Patient: Selena [name]           Date of Birth: September 15, 1935           MRN: [id number] Visit Date: 01/22/2018              Requested by: Caren Macadam, MD [place], [place] [address] PCP: Caren Macadam, MD   Assessment & Plan: Visit Diagnoses:  1. Acute pain of left shoulder     Plan: Have her continue to work on range of motion of the shoulder as shown by therapy.  We will see him back in 2 to 3 weeks to check her response to the injection.  We may obtain an MRI to rule out cuff tear and also evaluate the cartilage of the humeral head if her pain persist despite these conservative measures.  Questions were encouraged and answered at length  Follow-Up Instructions: Return in about 2 weeks (around 02/05/2018).   Orders:  Orders Placed This Encounter  Procedures  . Large Joint Inj   No orders of the defined types were placed in this encounter.     Procedures: Large Joint Inj: L subacromial bursa on 01/22/2018 4:58 PM Indications: pain Details: 22 G 1.5 in needle, lateral approach  Arthrogram: No  Medications: 3 mL lidocaine 1 %; 40 mg methylPREDNISolone acetate 40 MG/ML Outcome: tolerated well, no immediate complications Procedure, treatment alternatives, risks and benefits explained, specific risks discussed. Consent was given by the patient. Immediately prior to procedure a time out was called to verify the correct patient, procedure, equipment, support staff and site/side marked as required. Patient was prepped and draped in the usual sterile fashion.       Clinical Data: No additional findings.   Subjective: Chief Complaint  Patient presents with  . Left Shoulder - Pain    HPI Selena [name] is an 82 year old female comes in today for left shoulder pain that began approximately 4 months ago after a fall.  She has vertigo and states she just fell.  She saw her primary care physician who obtained radiographs of  the left shoulder there was no acute fracture.  Some minimal glenohumeral degenerative changes and acromial clavicular degenerative changes.  Shoulder was otherwise well located.  I personally reviewed these films. She went to physical therapy for several sessions and saw no improvement.  She is having pain in the shoulder with external rotation also with abduction across the chest.  She denies any numbness tingling down the arm.  History of right rotator cuff tear with repair some years ago.  Review of Systems Please see HPI otherwise negative  Objective: Vital Signs: There were no vitals taken for this visit.  Physical Exam Constitutional:      Appearance: Normal appearance. She is obese. She is not ill-appearing or diaphoretic.  Neurological:     Mental Status: She is alert and oriented to person, place, and time.  Psychiatric:        Mood and Affect: Mood normal.     Ortho Exam Left shoulder forward flexion actively to 90 degrees passively.  About 130 degrees.  Weakness with external rotation left shoulder against resistance.  Internal rotation 5 out of 5 bilateral shoulders.  Left shoulder positive impingement testing.  Empty can test negative on the right positive on the left. Specialty Comments:  No specialty comments available.  Imaging: No results found.   PMFS History: Patient Active Problem List   Diagnosis Date  Noted  . Upper airway cough syndrome 10/27/2017  . Essential hypertension 10/27/2017  . DOE (dyspnea on exertion) 10/26/2017  . Anxiety 09/10/2014  . Insomnia 09/10/2014  . Follicular 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 ([place]) 07/31/2014  . Paraspinal mass 07/16/2014  . Breast cancer of upper-outer quadrant of right female breast ([place]) 03/21/2011  . VERTIGO 09/07/2009  . Backache 07/09/2008  . FATIGUE 03/18/2008  . LFTs abnormal 12/26/2007  . MEMORY LOSS 05/16/2007  . ATRIAL FIBRILLATION 01/01/2007  . DIVERTICULOSIS, COLON 12/26/2006  .  Osteoarthritis 12/26/2006   Past Medical History:  Diagnosis Date  . Anemia    PMH  . Atrial fibrillation ([place]) 01/01/2007  . BACK PAIN, UPPER 07/09/2008  . Breast cancer, stage 1 ([place])   . Bruises easily   . Cataract    history  . Complication of anesthesia   . Diverticulitis   . DIVERTICULOSIS, COLON 12/26/2006  . Essential hypertension 10/27/2017  . Follicular non-Hodgkin's lymphoma ([place])   . Goiter    multi-nodular  . Hx of colonoscopy 2009  . LIVER FUNCTION TESTS, ABNORMAL, HX OF 12/26/2007  . Memory loss 05/16/2007  . OSTEOARTHRITIS 12/26/2006   takes Diclofenac daily but has stopped for surgery  . PONV (postoperative nausea and vomiting)   . Skin cancer   . VERTIGO 09/07/2009  . Wears dentures   . Wears glasses   . Wears hearing aid    bilateral    Family History  Problem Relation Age of Onset  . Colon cancer Mother   . Cancer Mother        colon  . Colon cancer Maternal Aunt   . Cancer Maternal Uncle   . Prostate cancer Maternal Uncle   . Prostate cancer Maternal Uncle   . Other Maternal Uncle   . Cancer Sister        kidney  . Cancer Brother        lymph node  . Anesthesia problems Neg Hx   . Hypotension Neg Hx   . Malignant hyperthermia Neg Hx   . Pseudochol deficiency Neg Hx   . Breast cancer Neg Hx     Past Surgical History:  Procedure Laterality Date  . ABDOMINAL HYSTERECTOMY    . ABLATION OF DYSRHYTHMIC FOCUS  2009  . APPENDECTOMY    . AUGMENTATION MAMMAPLASTY Right 2013  . BREAST BIOPSY  2013  . BREAST RECONSTRUCTION  04/14/2011   Procedure: BREAST RECONSTRUCTION;  Surgeon: Crissie Reese, MD;  Location: [place];  Service: Plastics;  Laterality: Right;  Placement of Right Breast Tissue Expander with  use of Flex HD for Breast Reconstruction  . BREAST SURGERY     right sm, snbx  . CATARACT EXTRACTION W/ INTRAOCULAR LENS  IMPLANT, BILATERAL  2010   bilateral  . COLONOSCOPY    . COLONOSCOPY    . KNEE SURGERY  2004/2010   arthroscopic/ bilateral knee  replacements  . MASTECTOMY Right 2013  . PORT-A-CATH REMOVAL N/A 12/07/2016   Procedure: REMOVAL PORT-A-CATH;  Surgeon: Rolm Bookbinder, MD;  Location: [place];  Service: General;  Laterality: N/A;  . PORTACATH PLACEMENT Right 08/11/2014   Procedure: INSERTION PORT-A-CATH WITH ULTRASOUND;  Surgeon: Rolm Bookbinder, MD;  Location: [place];  Service: General;  Laterality: Right;  . [place]   right  . tmi  1998  . TONSILLECTOMY     Social History   Occupational History  . Not on file  Tobacco Use  . Smoking status: Former Smoker  Packs/day: 0.10    Years: 20.00    Pack years: 2.00    Types: Cigarettes    Last attempt to quit: 01/24/1978    Years since quitting: 40.0  . Smokeless tobacco: Never Used  Substance and Sexual Activity  . Alcohol use: No  . Drug use: No  . Sexual activity: Yes    Birth control/protection: Surgical

## 2018-01-31 ENCOUNTER — Ambulatory Visit: Payer: Medicare Other | Admitting: Family Medicine

## 2018-01-31 ENCOUNTER — Ambulatory Visit (INDEPENDENT_AMBULATORY_CARE_PROVIDER_SITE_OTHER): Payer: Medicare Other

## 2018-01-31 ENCOUNTER — Encounter: Payer: Self-pay | Admitting: Family Medicine

## 2018-01-31 VITALS — BP 130/60 | HR 54 | Temp 98.1°F | Wt 198.5 lb

## 2018-01-31 DIAGNOSIS — R05 Cough: Secondary | ICD-10-CM

## 2018-01-31 DIAGNOSIS — E0590 Thyrotoxicosis, unspecified without thyrotoxic crisis or storm: Secondary | ICD-10-CM | POA: Diagnosis not present

## 2018-01-31 DIAGNOSIS — R5383 Other fatigue: Secondary | ICD-10-CM

## 2018-01-31 DIAGNOSIS — R059 Cough, unspecified: Secondary | ICD-10-CM

## 2018-01-31 LAB — COMPREHENSIVE METABOLIC PANEL WITH GFR
ALT: 30 U/L (ref 0–35)
AST: 28 U/L (ref 0–37)
Albumin: 4.2 g/dL (ref 3.5–5.2)
Alkaline Phosphatase: 85 U/L (ref 39–117)
BUN: 19 mg/dL (ref 6–23)
CO2: 30 meq/L (ref 19–32)
Calcium: 9.6 mg/dL (ref 8.4–10.5)
Chloride: 104 meq/L (ref 96–112)
Creatinine, Ser: 0.65 mg/dL (ref 0.40–1.20)
GFR: 93.14 mL/min
Glucose, Bld: 88 mg/dL (ref 70–99)
Potassium: 4.5 meq/L (ref 3.5–5.1)
Sodium: 142 meq/L (ref 135–145)
Total Bilirubin: 0.9 mg/dL (ref 0.2–1.2)
Total Protein: 6.1 g/dL (ref 6.0–8.3)

## 2018-01-31 LAB — CBC WITH DIFFERENTIAL/PLATELET
Basophils Absolute: 0.1 K/uL (ref 0.0–0.1)
Basophils Relative: 1.5 % (ref 0.0–3.0)
Eosinophils Absolute: 0.5 K/uL (ref 0.0–0.7)
Eosinophils Relative: 5.4 % — ABNORMAL HIGH (ref 0.0–5.0)
HCT: 43.4 % (ref 36.0–46.0)
Hemoglobin: 14.7 g/dL (ref 12.0–15.0)
Lymphocytes Relative: 13.9 % (ref 12.0–46.0)
Lymphs Abs: 1.2 K/uL (ref 0.7–4.0)
MCHC: 33.8 g/dL (ref 30.0–36.0)
MCV: 98 fl (ref 78.0–100.0)
Monocytes Absolute: 0.9 K/uL (ref 0.1–1.0)
Monocytes Relative: 10.5 % (ref 3.0–12.0)
Neutro Abs: 6 K/uL (ref 1.4–7.7)
Neutrophils Relative %: 68.7 % (ref 43.0–77.0)
Platelets: 200 K/uL (ref 150.0–400.0)
RBC: 4.43 Mil/uL (ref 3.87–5.11)
RDW: 13.6 % (ref 11.5–15.5)
WBC: 8.8 K/uL (ref 4.0–10.5)

## 2018-01-31 LAB — T3, FREE: T3 FREE: 3 pg/mL (ref 2.3–4.2)

## 2018-01-31 LAB — TSH: TSH: 0.51 u[IU]/mL (ref 0.35–4.50)

## 2018-01-31 LAB — T4, FREE: Free T4: 0.96 ng/dL (ref 0.60–1.60)

## 2018-01-31 MED ORDER — LEVOFLOXACIN 750 MG PO TABS: 750.0000 mg | ORAL_TABLET | Freq: Every day | ORAL | 0 refills | Status: DC

## 2018-01-31 NOTE — Progress Notes (Signed)
Selena [name] DOB: 1936/01/13 Encounter date: 01/31/2018  This is a 83 y.o. female who presents with Chief Complaint  Patient presents with  . Cough    has had cough since having pneumonia, cough/runny nose never got better, dry cough, "it leaves a bad taste in my mouth"    History of present illness: Feels like she never got over the pneumonia in October, just hacking cough with bad taste, but not productive. SOB is about the same.   States that shoulder is still bothering her. States ortho recommended MRI but she feels that that this isn't warranted because she was told it is not surgical.. Cortisone shot helped some.   Nose just running. Can't get rid of nasal drainage or nasal congestion. Not taking any medications for allergy.  Never felt like she fully got over pneumonia. Has been coughing since before October.    Energy level is low.   Atrovent nasal spray didn't help at all. Nose just keeps running.   Allergies  Allergen Reactions  . Codeine Phosphate Other (See Comments)    Hallucinations  . Codeine Other (See Comments)    Hallucinations    Current Meds  Medication Sig  . albuterol (PROVENTIL HFA;VENTOLIN HFA) 108 (90 Base) MCG/ACT inhaler INHALE 2 PUFFS INTO THE LUNGS EVERY 6 HOURS AS NEEDED FOR WHEEZING OR SHORTNESS OF BREATH  . amLODipine (NORVASC) 5 MG tablet Take 1 tablet (5 mg total) by mouth daily.  [name] aspirin EC 81 MG tablet Take 81 mg by mouth daily.  . famotidine (PEPCID) 20 MG tablet One at bedtime  . meclizine (ANTIVERT) 25 MG tablet Take 1 tablet (25 mg total) by mouth every 6 (six) hours as needed for dizziness.  . meloxicam (MOBIC) 7.5 MG tablet Take 1 tablet (7.5 mg total) by mouth daily.  . metoprolol tartrate (LOPRESSOR) 25 MG tablet Take 1 tablet (25 mg total) by mouth 2 (two) times daily.  . ondansetron (ZOFRAN) 4 MG tablet Take 1 tablet (4 mg total) by mouth every 8 (eight) hours as needed for nausea or vomiting.  . pantoprazole (PROTONIX) 40 MG  tablet TAKE 1 TABLET(40 MG) BY MOUTH DAILY 30 TO 60 MINUTES BEFORE FIRST MEAL OF THE DAY    Review of Systems  Constitutional: Negative for chills (does feel chilly every night) and fever.  HENT: Positive for congestion and postnasal drip. Negative for sinus pressure and sinus pain.   Respiratory: Positive for cough and shortness of breath. Wheezing: isn't sure.   Cardiovascular: Negative for chest pain, palpitations and leg swelling.  Neurological: Positive for weakness.    Objective:  BP 130/60 (BP Location: Left Arm, Patient Position: Sitting, Cuff Size: Normal)   Pulse (!) 54   Temp 98.1 F (36.7 C) (Oral)   Wt 198 lb 8 oz (90 kg)   SpO2 94%   BMI 32.53 kg/m   Weight: 198 lb 8 oz (90 kg)   BP Readings from Last 3 Encounters:  01/31/18 130/60  12/11/17 132/83  12/07/17 (!) 144/64   Wt Readings from Last 3 Encounters:  01/31/18 198 lb 8 oz (90 kg)  12/11/17 195 lb 6.4 oz (88.6 kg)  12/07/17 197 lb (89.4 kg)    Physical Exam Constitutional:      Appearance: She is well-developed. She is ill-appearing. She is not toxic-appearing.  HENT:     Right Ear: Tympanic membrane, ear canal and external ear normal.     Left Ear: Tympanic membrane, ear canal and external ear normal.  Nose: Mucosal edema present.     Right Sinus: No maxillary sinus tenderness or frontal sinus tenderness.     Left Sinus: No maxillary sinus tenderness or frontal sinus tenderness.     Mouth/Throat:     Pharynx: Uvula midline.  Neck:     Vascular: No carotid bruit.  Cardiovascular:     Rate and Rhythm: Normal rate and regular rhythm.     Heart sounds: Murmur present. Systolic murmur present with a grade of 2/6.  Pulmonary:     Effort: Pulmonary effort is normal.     Breath sounds: Decreased air movement present. Examination of the right-middle field reveals decreased breath sounds. Examination of the right-lower field reveals decreased breath sounds and rales. Examination of the left-lower field  reveals decreased breath sounds and rales. Decreased breath sounds, rhonchi and rales (minimal) present.     Assessment/Plan 1. Cough Repeat CXR results back before note completed. Shows possible early pneumonia right middle lobe. Due to worsening symptoms and history; will treat with antibiotic but plan for close follow up with [place] or pulm in 1-2 weeks time. Sooner if any worsening. - DG Chest 2 View; Future - DG Chest 2 View - levofloxacin (LEVAQUIN) 750 MG tablet; Take 1 tablet (750 mg total) by mouth daily.  Dispense: 7 tablet; Refill: 0  2. Fatigue, unspecified type - Comprehensive metabolic panel; Future - CBC with Differential/Platelet; Future - CBC with Differential/Platelet - Comprehensive metabolic panel  3. Hyperthyroidism - TSH; Future - T4, free; Future - T3, free - T4, free - TSH   Return pending results. Reviewed previous chest xrays, pulmonology notes. Last CT completed 09/22/17 and showed ascending AA 4.2cm but otherwise no acute abnormality of chest.    Micheline Rough, MD

## 2018-02-05 ENCOUNTER — Ambulatory Visit (INDEPENDENT_AMBULATORY_CARE_PROVIDER_SITE_OTHER): Payer: Medicare Other | Admitting: Orthopaedic Surgery

## 2018-02-05 ENCOUNTER — Encounter (INDEPENDENT_AMBULATORY_CARE_PROVIDER_SITE_OTHER): Payer: Self-pay | Admitting: Orthopaedic Surgery

## 2018-02-05 DIAGNOSIS — M25512 Pain in left shoulder: Secondary | ICD-10-CM | POA: Diagnosis not present

## 2018-02-05 NOTE — Progress Notes (Signed)
Patient is a very pleasant 83 year old that 2 weeks ago I saw for acute left shoulder pain.  Her pain was quite significant and severe so I placed a steroid injection in the subacromial space.  She reports significant relief since that injection.  She says she is not 100% yet but does feel better overall.  She would like to have another injection today.  I did explain to her why we could not do that today.  She is not a diabetic.  On exam her left shoulder does show some deficits in the rotator cuff itself but otherwise looks much better than it did 2 weeks ago.  I do not mind seeing her back in 4 weeks and consider repeat injection then even though would be close together since she is not a diabetic because I do feel it would help her quite a bit if she still needs it in.  All question concerns were answered and addressed.

## 2018-02-14 ENCOUNTER — Encounter: Payer: Self-pay | Admitting: Family Medicine

## 2018-02-14 ENCOUNTER — Ambulatory Visit: Payer: Medicare Other | Admitting: Family Medicine

## 2018-02-14 VITALS — BP 124/60 | HR 71 | Temp 97.9°F | Wt 199.3 lb

## 2018-02-14 DIAGNOSIS — R002 Palpitations: Secondary | ICD-10-CM

## 2018-02-14 DIAGNOSIS — R05 Cough: Secondary | ICD-10-CM

## 2018-02-14 DIAGNOSIS — R059 Cough, unspecified: Secondary | ICD-10-CM

## 2018-02-14 NOTE — Progress Notes (Signed)
Selena [name] DOB: [birth year] Encounter date: 02/14/2018  This is a 83 y.o. female who presents with Chief Complaint  Patient presents with  . Follow-up    History of present illness: Selena [name] was last seen 2 weeks ago with complaints of worsening cough, feeling fatigued. CXR revealed an early right sided pneumonia. She was started on levaquin (which she was treated with in past for left sided pneumonia that was not responding well to initial antibiotic) and presents today for follow up. She does feel significantly better than she did last week. Cough is still present but has improved. Breathing feels more comfortable. No fevers, chills, night sweats.. She tolerated antibiotic without difficulty.   Did start on the claritin which she hasn't noted has improved nasal drainage.   Has noted that in evening can be sitting in chair and heart feels like it is going to beat out of chest. Was going to get evaluated, but it went away on own. Checked bp 157/80 but this improved. Happens just in evening after she has been "going all day". Feels like heart is out of rhythm. States it feels similar to when she has had this abnormal rhythm before. Started on Saturday night and has come nightly since that time. Feels like whole body is shaking. bp back to normal range (120/60). Lasting 5 minutes but usually less. Not short of breath with this and not getting sweaty. Has taken aspirin when it has occurred.   Overall cough symptoms have improved, see above.   Now with complaints of rapid heart rate in evening not associated with other symptoms.     Allergies  Allergen Reactions  . Codeine Phosphate Other (See Comments)    Hallucinations  . Adhesive [Tape]     Electrodes for EKG- skin blistering, erythema  . Codeine Other (See Comments)    Hallucinations    Current Meds  Medication Sig  . amLODipine (NORVASC) 5 MG tablet Take 1 tablet (5 mg total) by mouth daily.  [name] aspirin EC 81 MG tablet Take 81 mg by  mouth daily.  . famotidine (PEPCID) 20 MG tablet One at bedtime  . meclizine (ANTIVERT) 25 MG tablet Take 1 tablet (25 mg total) by mouth every 6 (six) hours as needed for dizziness.  . meloxicam (MOBIC) 7.5 MG tablet Take 1 tablet (7.5 mg total) by mouth daily.  . metoprolol tartrate (LOPRESSOR) 25 MG tablet Take 1 tablet (25 mg total) by mouth 2 (two) times daily.  . ondansetron (ZOFRAN) 4 MG tablet Take 1 tablet (4 mg total) by mouth every 8 (eight) hours as needed for nausea or vomiting.  . pantoprazole (PROTONIX) 40 MG tablet TAKE 1 TABLET(40 MG) BY MOUTH DAILY 30 TO 60 MINUTES BEFORE FIRST MEAL OF THE DAY  . [DISCONTINUED] albuterol (PROVENTIL HFA;VENTOLIN HFA) 108 (90 Base) MCG/ACT inhaler INHALE 2 PUFFS INTO THE LUNGS EVERY 6 HOURS AS NEEDED FOR WHEEZING OR SHORTNESS OF BREATH  . [DISCONTINUED] levofloxacin (LEVAQUIN) 750 MG tablet Take 1 tablet (750 mg total) by mouth daily.    Review of Systems  Constitutional: Negative for chills, fatigue (mild) and fever.  HENT: Positive for congestion (mild) and rhinorrhea.   Respiratory: Positive for cough. Negative for chest tightness, shortness of breath and wheezing.   Cardiovascular: Positive for palpitations (lasts minutes in the evening, see hpi). Negative for chest pain and leg swelling.  Neurological: Negative for dizziness, light-headedness and headaches.    Objective:  BP 124/60 (BP Location: Left Arm, Patient Position: Sitting, Cuff  Size: Normal)   Pulse 71   Temp 97.9 F (36.6 C) (Oral)   Wt 199 lb 4.8 oz (90.4 kg)   SpO2 97%   BMI 32.66 kg/m   Weight: 199 lb 4.8 oz (90.4 kg)   BP Readings from Last 3 Encounters:  02/14/18 124/60  01/31/18 130/60  12/11/17 132/83   Wt Readings from Last 3 Encounters:  02/14/18 199 lb 4.8 oz (90.4 kg)  01/31/18 198 lb 8 oz (90 kg)  12/11/17 195 lb 6.4 oz (88.6 kg)    Physical Exam Constitutional:      General: She is not in acute distress.    Appearance: Normal appearance. She is  well-developed. She is not ill-appearing.  Cardiovascular:     Rate and Rhythm: Normal rate and regular rhythm.  No extrasystoles are present.    Heart sounds: Murmur present. Systolic murmur present with a grade of 2/6. No friction rub.  Pulmonary:     Effort: Pulmonary effort is normal. No respiratory distress.     Breath sounds: Examination of the right-middle field reveals rales. Examination of the right-lower field reveals rales. Rales present. No wheezing.  Musculoskeletal:     Right lower leg: No edema.     Left lower leg: No edema.  Neurological:     Mental Status: She is alert and oriented to person, place, and time.  Psychiatric:        Behavior: Behavior normal. Behavior is cooperative.     Assessment/Plan 1. Palpitations New sx noted in evening. I have ordered monitor (written on order and advised to ask about sensitive skin electrodes) and asked her to schedule f/u with cardiology. Continue with aspirin. Did advise that if she has this sensation again and it lasts more than a few minutes, she should proceed to ER for evaluation.  - Holter monitor - 48 hour; Future  2. Cough - symptoms have improved with antibiotic treatment. I would like to repeat a cxr in the future, but will give her at least a few more weeks to note resolution on cxr as long as continuing to improve clinically. Let me know if any worsening of symptoms.    Return if symptoms worsen or fail to improve.    Micheline Rough, MD

## 2018-02-14 NOTE — Patient Instructions (Addendum)
Call cardiology for appointment. Tell them you've been having palpitations and that we ordered a Holter Monitor for you but that we want you to follow up with the cardiologist for this as well.  Let me know about any worsening of cough/respiratory symptoms.   Consider mucinex (plain mucinex - guaifenecin) to help break up congestion. Keep doing the claritin.

## 2018-02-19 ENCOUNTER — Other Ambulatory Visit: Payer: Self-pay | Admitting: Internal Medicine

## 2018-02-19 DIAGNOSIS — M150 Primary generalized (osteo)arthritis: Principal | ICD-10-CM

## 2018-02-19 DIAGNOSIS — M159 Polyosteoarthritis, unspecified: Secondary | ICD-10-CM

## 2018-02-22 ENCOUNTER — Other Ambulatory Visit: Payer: Self-pay | Admitting: Internal Medicine

## 2018-02-22 DIAGNOSIS — R42 Dizziness and giddiness: Secondary | ICD-10-CM

## 2018-02-26 ENCOUNTER — Ambulatory Visit (INDEPENDENT_AMBULATORY_CARE_PROVIDER_SITE_OTHER): Payer: Medicare Other

## 2018-02-26 DIAGNOSIS — R002 Palpitations: Secondary | ICD-10-CM

## 2018-02-28 NOTE — Telephone Encounter (Signed)
Pt checking status on her med refill.  

## 2018-02-28 NOTE — Telephone Encounter (Signed)
Requested medication (s) are due for refill today: Yes  Requested medication (s) are on the active medication list: Yes  Last refill:  08/28/17  Future visit scheduled: No  Notes to clinic:  See request    Requested Prescriptions  Pending Prescriptions Disp Refills   meclizine (ANTIVERT) 25 MG tablet [Pharmacy Med Name: MECLIZINE 25MG  RX TABLETS] 60 tablet 4    Sig: TAKE 1 TABLET BY MOUTH EVERY 6 HOURS AS NEEDED FOR DIZZINESS     Not Delegated - Gastroenterology: Antiemetics Failed - 02/28/2018  1:24 PM      Failed - This refill cannot be delegated      Passed - Valid encounter within last 6 months    Recent Outpatient Visits          2 weeks ago [place] [facility], Steele Berg, MD   4 weeks ago Cough   [place] [facility], Steele Berg, MD   3 months ago Hypertension, unspecified type   [facility], Steele Berg, MD   3 months ago Cough   [facility], Steele Berg, MD   3 months ago Cough   [facility], Steele Berg, MD      Future Appointments            In 5 days Ninfa Linden, Lind Guest, MD [facility]

## 2018-03-01 ENCOUNTER — Other Ambulatory Visit: Payer: Self-pay

## 2018-03-01 DIAGNOSIS — R42 Dizziness and giddiness: Secondary | ICD-10-CM

## 2018-03-01 MED ORDER — MECLIZINE HCL 25 MG PO TABS: 25.0000 mg | ORAL_TABLET | Freq: Four times a day (QID) | ORAL | 4 refills | Status: DC | PRN

## 2018-03-05 ENCOUNTER — Encounter (INDEPENDENT_AMBULATORY_CARE_PROVIDER_SITE_OTHER): Payer: Self-pay | Admitting: Orthopaedic Surgery

## 2018-03-05 ENCOUNTER — Ambulatory Visit (INDEPENDENT_AMBULATORY_CARE_PROVIDER_SITE_OTHER): Payer: Medicare Other | Admitting: Orthopaedic Surgery

## 2018-03-05 DIAGNOSIS — M25512 Pain in left shoulder: Secondary | ICD-10-CM | POA: Diagnosis not present

## 2018-03-05 MED ORDER — LIDOCAINE HCL 1 % IJ SOLN
3.0000 mL | INTRAMUSCULAR | Status: AC | PRN
  Administered 2018-03-05: 3 mL

## 2018-03-05 MED ORDER — METHYLPREDNISOLONE ACETATE 40 MG/ML IJ SUSP
40.0000 mg | INTRAMUSCULAR | Status: AC | PRN
  Administered 2018-03-05: 40 mg via INTRA_ARTICULAR

## 2018-03-05 NOTE — Progress Notes (Signed)
Office Visit Note   Patient: Selena [name]           Date of Birth: May 30, 1935           MRN: [id number] Visit Date: 03/05/2018              Requested by: Caren Macadam, MD [place], [place] [address] PCP: Caren Macadam, MD   Assessment & Plan: Visit Diagnoses:  1. Acute pain of left shoulder     Plan: Per her wishes I did provide a steroid injection in the left shoulder subacromial space.  I did counsel her about the effect on her blood sugars and the risk and benefits of injections.  I told her to wait at least 3 to 4 months before repeat injection and only if needed.  She is not interested in any type of therapy or other intervention.  All question concerns were answered and addressed.  Follow-up will otherwise be as needed.  Follow-Up Instructions: Return if symptoms worsen or fail to improve.   Orders:  Orders Placed This Encounter  Procedures  . Large Joint Inj   No orders of the defined types were placed in this encounter.     Procedures: Large Joint Inj: L subacromial bursa on 03/05/2018 1:28 PM Indications: pain and diagnostic evaluation Details: 22 G 1.5 in needle  Arthrogram: No  Medications: 3 mL lidocaine 1 %; 40 mg methylPREDNISolone acetate 40 MG/ML Outcome: tolerated well, no immediate complications Procedure, treatment alternatives, risks and benefits explained, specific risks discussed. Consent was given by the patient. Immediately prior to procedure a time out was called to verify the correct patient, procedure, equipment, support staff and site/side marked as required. Patient was prepped and draped in the usual sterile fashion.       Clinical Data: No additional findings.   Subjective: Chief Complaint  Patient presents with  . Left Shoulder - Follow-up  The patient is a very pleasant 83 year old female well-known to me.  She has significant impingement syndrome and likely some disease of the rotator cuff of her  left shoulder.  Back in December I did place a steroid injection in the subacromial space and this helped her greatly.  It did not detrimentally affect her blood glucose.  She is very active and would like me to inject her shoulder 1 more time today.  She reports problems with overhead activity and reaching behind her and pain with activities daily living of her left shoulder. HPI  Review of Systems She currently denies any headache, chest pain, shortness of breath, fever, chills, nausea, vomiting.  Objective: Vital Signs: There were no vitals taken for this visit.  Physical Exam She is alert and oriented x3 and in no acute distress Ortho Exam Examination of her left shoulder does show some weakness of the rotator cuff.  She does have positive Neer and Hawkins signs and pain in the subacromial outlet area. Specialty Comments:  No specialty comments available.  Imaging: No results found.   PMFS History: Patient Active Problem List   Diagnosis Date Noted  . Upper airway cough syndrome 10/27/2017  . Essential hypertension 10/27/2017  . DOE (dyspnea on exertion) 10/26/2017  . Anxiety 09/10/2014  . Insomnia 09/10/2014  . Follicular 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 ([place]) 07/31/2014  . Paraspinal mass 07/16/2014  . Breast cancer of upper-outer quadrant of right female breast ([place]) 03/21/2011  . VERTIGO 09/07/2009  . Backache 07/09/2008  . FATIGUE 03/18/2008  .  LFTs abnormal 12/26/2007  . MEMORY LOSS 05/16/2007  . ATRIAL FIBRILLATION 01/01/2007  . DIVERTICULOSIS, COLON 12/26/2006  . Osteoarthritis 12/26/2006   Past Medical History:  Diagnosis Date  . Anemia    PMH  . Atrial fibrillation ([place]) 01/01/2007  . BACK PAIN, UPPER 07/09/2008  . Breast cancer, stage 1 ([place])   . Bruises easily   . Cataract    history  . Complication of anesthesia   . Diverticulitis   . DIVERTICULOSIS, COLON 12/26/2006  . Essential hypertension 10/27/2017  . Follicular non-Hodgkin's  lymphoma ([place])   . Goiter    multi-nodular  . Hx of colonoscopy 2009  . LIVER FUNCTION TESTS, ABNORMAL, HX OF 12/26/2007  . Memory loss 05/16/2007  . OSTEOARTHRITIS 12/26/2006   takes Diclofenac daily but has stopped for surgery  . PONV (postoperative nausea and vomiting)   . Skin cancer   . VERTIGO 09/07/2009  . Wears dentures   . Wears glasses   . Wears hearing aid    bilateral    Family History  Problem Relation Age of Onset  . Colon cancer Mother   . Cancer Mother        colon  . Colon cancer Maternal Aunt   . Cancer Maternal Uncle   . Prostate cancer Maternal Uncle   . Prostate cancer Maternal Uncle   . Other Maternal Uncle   . Cancer Sister        kidney  . Cancer Brother        lymph node  . Anesthesia problems Neg Hx   . Hypotension Neg Hx   . Malignant hyperthermia Neg Hx   . Pseudochol deficiency Neg Hx   . Breast cancer Neg Hx     Past Surgical History:  Procedure Laterality Date  . ABDOMINAL HYSTERECTOMY    . ABLATION OF DYSRHYTHMIC FOCUS  2009  . APPENDECTOMY    . AUGMENTATION MAMMAPLASTY Right 2013  . BREAST BIOPSY  2013  . BREAST RECONSTRUCTION  04/14/2011   Procedure: BREAST RECONSTRUCTION;  Surgeon: Crissie Reese, MD;  Location: [facility];  Service: Plastics;  Laterality: Right;  Placement of Right Breast Tissue Expander with  use of Flex HD for Breast Reconstruction  . BREAST SURGERY     right sm, snbx  . CATARACT EXTRACTION W/ INTRAOCULAR LENS  IMPLANT, BILATERAL  2010   bilateral  . COLONOSCOPY    . COLONOSCOPY    . KNEE SURGERY  2004/2010   arthroscopic/ bilateral knee replacements  . MASTECTOMY Right 2013  . PORT-A-CATH REMOVAL N/A 12/07/2016   Procedure: REMOVAL PORT-A-CATH;  Surgeon: Rolm Bookbinder, MD;  Location: [facility];  Service: General;  Laterality: N/A;  . PORTACATH PLACEMENT Right 08/11/2014   Procedure: INSERTION PORT-A-CATH WITH ULTRASOUND;  Surgeon: Rolm Bookbinder, MD;  Location: [facility];  Service: General;  Laterality: Right;  .  Tamms   right  . tmi  1998  . TONSILLECTOMY     Social History   Occupational History  . Not on file  Tobacco Use  . Smoking status: Former Smoker    Packs/day: 0.10    Years: 20.00    Pack years: 2.00    Types: Cigarettes    Last attempt to quit: 01/24/1978    Years since quitting: 40.1  . Smokeless tobacco: Never Used  Substance and Sexual Activity  . Alcohol use: No  . Drug use: No  . Sexual activity: Yes    Birth control/protection: Surgical

## 2018-03-07 ENCOUNTER — Telehealth: Payer: Self-pay | Admitting: *Deleted

## 2018-03-07 NOTE — Telephone Encounter (Signed)
I spoke with pt who reports she is taking metoprolol.  She is aware to continue this. Pt thought her appointment was February 18th.  She would like to see Dr. Angelena Form if possible.  I scheduled pt to see Dr. Angelena Form on February 24,2020 at 11:00

## 2018-03-07 NOTE — Telephone Encounter (Signed)
-----   Message from Burnell Blanks, MD sent at 03/02/2018 10:26 AM EST ----- I saw Mr. Willadsen last year. I think her palpitations are likely due to the PACs and short runs of atrial tach vs SVT. I would recommend that she take an extra dose of Lopressor 25 mg when she feels her heart racing. This is benign. Thanks, Darlina Guys

## 2018-03-07 NOTE — Telephone Encounter (Signed)
I placed call to pt and left message to call office.  

## 2018-03-07 NOTE — Telephone Encounter (Signed)
I spoke with pt. Primary care has requested she follow up with cardiology and this appointment has been scheduled for March 18,2020 with Ermalinda Barrios, PA. I gave pt instructions about taking extra metoprolol daily if needed when heart is racing.   I told her to let us know if she has to take extra dose frequently. Pt is not sure she is taking metoprolol. She will check her medication bottles and call me back.

## 2018-03-07 NOTE — Telephone Encounter (Signed)
F/U Message ° ° ° ° ° ° ° ° ° °Patient returned your call °

## 2018-03-19 ENCOUNTER — Ambulatory Visit: Payer: Medicare Other | Admitting: Cardiovascular Disease

## 2018-03-19 ENCOUNTER — Encounter: Payer: Self-pay | Admitting: Cardiovascular Disease

## 2018-03-19 VITALS — BP 150/74 | HR 55 | Ht 65.5 in | Wt 208.0 lb

## 2018-03-19 DIAGNOSIS — I10 Essential (primary) hypertension: Secondary | ICD-10-CM | POA: Diagnosis not present

## 2018-03-19 DIAGNOSIS — I712 Thoracic aortic aneurysm, without rupture, unspecified: Secondary | ICD-10-CM

## 2018-03-19 DIAGNOSIS — I471 Supraventricular tachycardia: Secondary | ICD-10-CM

## 2018-03-19 MED ORDER — METOPROLOL TARTRATE 25 MG PO TABS: 25.0000 mg | ORAL_TABLET | Freq: Three times a day (TID) | ORAL | 3 refills | Status: DC

## 2018-03-19 NOTE — Progress Notes (Signed)
Chief Complaint  Patient presents with  . Follow-up    HTN   History of Present Illness: 83 yo female with history of HTN, breast cancer, Non-Hodgkins lymphoma, arthritis, thoracic aortic aneurysm and atrial fibrillation who is here today for cardiac follow up. I saw her as a new consult for the evaluation of dyspnea and thoracic aortic aneurysm in September 2019. She was seen in the ED at [facility] August 2019 with c/o dyspnea for 2 months. D-dimer was elevated. CTA chest negative for PE. There was a 4.2 cm thoracic aortic aneurysm. No evidence of aortic dissection. There was no underlying lung disease. Echo August 2019 with mild LVH, normal LV systolic function with DQQI=29-79%. There was grade 2 diastolic dysfunction. She had breast cancer March 2013 treated with right mastectomy and chemotherapy. She had remote atrial fibrillation in 2008 and was seen by Dr. Caryl Comes. She thinks she had a cardioversion. She has not been on anti-coagulation. At her first visit with me in September 2019 she denied palpitations or chest pain but did endorse dyspnea with minimal exertion. She  Was seen in the pulmonary office by Dr. Melvyn Novas but she is unclear if she was felt to have lung disease. I cannot tell from the pulmonary note what the diagnosis was. Cardiac monitor February 2020 with PACs, PVCs and SVT.   She is here today for follow up. The patient denies any chest pain, lower extremity edema, orthopnea, PND, dizziness, near syncope or syncope. She has palpitations at night. She is still having dyspnea with exertion.    Primary Care Physician: Caren Macadam, MD   Past Medical History:  Diagnosis Date  . Anemia    PMH  . Atrial fibrillation ([place]) 01/01/2007  . BACK PAIN, UPPER 07/09/2008  . Breast cancer, stage 1 ([place])   . Bruises easily   . Cataract    history  . Complication of anesthesia   . Diverticulitis   . DIVERTICULOSIS, COLON 12/26/2006  . Essential hypertension 10/27/2017  . Follicular  non-Hodgkin's lymphoma ([place])   . Goiter    multi-nodular  . Hx of colonoscopy 2009  . LIVER FUNCTION TESTS, ABNORMAL, HX OF 12/26/2007  . Memory loss 05/16/2007  . OSTEOARTHRITIS 12/26/2006   takes Diclofenac daily but has stopped for surgery  . PONV (postoperative nausea and vomiting)   . Skin cancer   . VERTIGO 09/07/2009  . Wears dentures   . Wears glasses   . Wears hearing aid    bilateral    Past Surgical History:  Procedure Laterality Date  . ABDOMINAL HYSTERECTOMY    . ABLATION OF DYSRHYTHMIC FOCUS  2009  . APPENDECTOMY    . AUGMENTATION MAMMAPLASTY Right 2013  . BREAST BIOPSY  2013  . BREAST RECONSTRUCTION  04/14/2011   Procedure: BREAST RECONSTRUCTION;  Surgeon: Crissie Reese, MD;  Location: [facility];  Service: Plastics;  Laterality: Right;  Placement of Right Breast Tissue Expander with  use of Flex HD for Breast Reconstruction  . BREAST SURGERY     right sm, snbx  . CATARACT EXTRACTION W/ INTRAOCULAR LENS  IMPLANT, BILATERAL  2010   bilateral  . COLONOSCOPY    . COLONOSCOPY    . KNEE SURGERY  2004/2010   arthroscopic/ bilateral knee replacements  . MASTECTOMY Right 2013  . PORT-A-CATH REMOVAL N/A 12/07/2016   Procedure: REMOVAL PORT-A-CATH;  Surgeon: Rolm Bookbinder, MD;  Location: [facility];  Service: General;  Laterality: N/A;  . PORTACATH PLACEMENT Right 08/11/2014   Procedure: INSERTION PORT-A-CATH  WITH ULTRASOUND;  Surgeon: Rolm Bookbinder, MD;  Location: [facility];  Service: General;  Laterality: Right;  . [place]   right  . tmi  1998  . TONSILLECTOMY      Current Outpatient Medications  Medication Sig Dispense Refill  . amLODipine (NORVASC) 5 MG tablet Take 1 tablet (5 mg total) by mouth daily. 90 tablet 1  . aspirin EC 81 MG tablet Take 81 mg by mouth daily.    . famotidine (PEPCID) 20 MG tablet One at bedtime 30 tablet 11  . meclizine (ANTIVERT) 25 MG tablet Take 1 tablet (25 mg total) by mouth every 6 (six) hours as needed for dizziness. 60  tablet 4  . meloxicam (MOBIC) 7.5 MG tablet Take 1 tablet (7.5 mg total) by mouth daily. 90 tablet 1  . metoprolol tartrate (LOPRESSOR) 25 MG tablet Take 1 tablet (25 mg total) by mouth 3 (three) times daily. 270 tablet 3  . ondansetron (ZOFRAN) 4 MG tablet Take 1 tablet (4 mg total) by mouth every 8 (eight) hours as needed for nausea or vomiting. 20 tablet 0  . pantoprazole (PROTONIX) 40 MG tablet TAKE 1 TABLET(40 MG) BY MOUTH DAILY 30 TO 60 MINUTES BEFORE FIRST MEAL OF THE DAY 30 tablet 2   No current facility-administered medications for this visit.     Allergies  Allergen Reactions  . Codeine Phosphate Other (See Comments)    Hallucinations  . Adhesive [Tape]     Electrodes for EKG- skin blistering, erythema  . Codeine Other (See Comments)    Hallucinations     Social History   Socioeconomic History  . Marital status: Widowed    Spouse name: Not on file  . Number of children: Not on file  . Years of education: Not on file  . Highest education level: Not on file  Occupational History  . Not on file  Social Needs  . Financial resource strain: Not on file  . Food insecurity:    Worry: Not on file    Inability: Not on file  . Transportation needs:    Medical: Not on file    Non-medical: Not on file  Tobacco Use  . Smoking status: Former Smoker    Packs/day: 0.10    Years: 20.00    Pack years: 2.00    Types: Cigarettes    Last attempt to quit: 01/24/1978    Years since quitting: 40.1  . Smokeless tobacco: Never Used  Substance and Sexual Activity  . Alcohol use: No  . Drug use: No  . Sexual activity: Yes    Birth control/protection: Surgical  Lifestyle  . Physical activity:    Days per week: Not on file    Minutes per session: Not on file  . Stress: Not on file  Relationships  . Social connections:    Talks on phone: Not on file    Gets together: Not on file    Attends religious service: Not on file    Active member of club or organization: Not on file     Attends meetings of clubs or organizations: Not on file    Relationship status: Not on file  . Intimate partner violence:    Fear of current or ex partner: Not on file    Emotionally abused: Not on file    Physically abused: Not on file    Forced sexual activity: Not on file  Other Topics Concern  . Not on file  Social History Narrative  .  Not on file    Family History  Problem Relation Age of Onset  . Colon cancer Mother   . Cancer Mother        colon  . Colon cancer Maternal Aunt   . Cancer Maternal Uncle   . Prostate cancer Maternal Uncle   . Prostate cancer Maternal Uncle   . Other Maternal Uncle   . Cancer Sister        kidney  . Cancer Brother        lymph node  . Anesthesia problems Neg Hx   . Hypotension Neg Hx   . Malignant hyperthermia Neg Hx   . Pseudochol deficiency Neg Hx   . Breast cancer Neg Hx     Review of Systems:  As stated in the HPI and otherwise negative.   BP (!) 150/74   Pulse (!) 55   Ht 5' 5.5" (1.664 m)   Wt 208 lb (94.3 kg)   SpO2 99%   BMI 34.09 kg/m   Physical Examination: General: Well developed, well nourished, NAD  HEENT: OP clear, mucus membranes moist  SKIN: warm, dry. No rashes. Neuro: No focal deficits  Musculoskeletal: Muscle strength 5/5 all ext  Psychiatric: Mood and affect normal  Neck: No JVD, no carotid bruits, no thyromegaly, no lymphadenopathy.  Lungs:Clear bilaterally, no wheezes, rhonci, crackles Cardiovascular: Regular rate and rhythm. No murmurs, gallops or rubs. Abdomen:Soft. Bowel sounds present. Non-tender.  Extremities: No lower extremity edema. Pulses are 2 + in the bilateral DP/PT.  EKG:  EKG is not  ordered today. The ekg ordered today demonstrates   Recent Labs: 09/22/2017: Pro B Natriuretic peptide (BNP) 209.0 01/31/2018: ALT 30; BUN 19; Creatinine, Ser 0.65; Hemoglobin 14.7; Platelets 200.0; Potassium 4.5; Sodium 142; TSH 0.51   Lipid Panel    Component Value Date/Time   CHOL 169 08/28/2017 1410    TRIG 103.0 08/28/2017 1410   HDL 43.20 08/28/2017 1410   CHOLHDL 4 08/28/2017 1410   VLDL 20.6 08/28/2017 1410   LDLCALC 105 (H) 08/28/2017 1410   LDLDIRECT 133.0 08/23/2016 1403     Wt Readings from Last 3 Encounters:  03/19/18 208 lb (94.3 kg)  02/14/18 199 lb 4.8 oz (90.4 kg)  01/31/18 198 lb 8 oz (90 kg)     Other studies Reviewed: Additional studies/ records that were reviewed today include: . Review of the above records demonstrates:    Assessment and Plan:   1. Thoracic aortic aneurysm: She has a slight dilation of ascending thoracic aorta, 4.2 cm. Repeat chest CTA August 2020.    2. HTN: BP is well controlled at home.   3. SVT/PACs/PVCs: will continue Lopressor 25 mg po TID.   Current medicines are reviewed at length with the patient today.  The patient does not have concerns regarding medicines.  The following changes have been made:  no change  Labs/ tests ordered today include:   No orders of the defined types were placed in this encounter.    Disposition:   FU with me in 12 months   Signed, Lauree Chandler, MD 03/19/2018 11:38 AM    [facility] [address], [place], [place]  [address] Phone: [phone]; Fax: [phone]

## 2018-03-19 NOTE — Patient Instructions (Addendum)
Medication Instructions:  Your physician recommends that you continue on your current medications as directed. Please refer to the Current Medication list given to you today.  If you need a refill on your cardiac medications before your next appointment, please call your pharmacy.   Lab work: Lab work is scheduled for 09/17/18 If you have labs (blood work) drawn today and your tests are completely normal, you will receive your results only by: [name] MyChart Message (if you have MyChart) OR . A paper copy in the mail If you have any lab test that is abnormal or we need to change your treatment, we will call you to review the results.  Testing/Procedures: CT scan is scheduled for 09/25/18  Follow-Up: At [facility], you and your health needs are our priority.  As part of our continuing mission to provide you with exceptional heart care, we have created designated Provider Care Teams.  These Care Teams include your primary Cardiologist (physician) and Advanced Practice Providers (APPs -  Physician Assistants and Nurse Practitioners) who all work together to provide you with the care you need, when you need it. You will need a follow up appointment in 12 months.  Please call our office 2 months in advance to schedule this appointment.  You may see Lauree Chandler, MD or one of the following Advanced Practice Providers on your designated Care Team:   [place], PA-C Melina Copa, PA-C . Ermalinda Barrios, PA-C  Any Other Special Instructions Will Be Listed Below (If Applicable).

## 2018-03-29 ENCOUNTER — Other Ambulatory Visit: Payer: Medicare Other

## 2018-04-11 ENCOUNTER — Ambulatory Visit: Payer: Medicare Other | Admitting: Physician Assistant

## 2018-04-19 ENCOUNTER — Other Ambulatory Visit: Payer: Self-pay | Admitting: General Surgery

## 2018-04-19 DIAGNOSIS — R0609 Other forms of dyspnea: Principal | ICD-10-CM

## 2018-04-19 MED ORDER — PANTOPRAZOLE SODIUM 40 MG PO TBEC: DELAYED_RELEASE_TABLET | ORAL | 3 refills | Status: DC

## 2018-04-23 ENCOUNTER — Other Ambulatory Visit: Payer: Self-pay

## 2018-04-23 DIAGNOSIS — M150 Primary generalized (osteo)arthritis: Principal | ICD-10-CM

## 2018-04-23 DIAGNOSIS — M159 Polyosteoarthritis, unspecified: Secondary | ICD-10-CM

## 2018-04-23 MED ORDER — MELOXICAM 7.5 MG PO TABS: 7.5000 mg | ORAL_TABLET | Freq: Every day | ORAL | 1 refills | Status: DC

## 2018-04-23 NOTE — Addendum Note (Signed)
Addended by: Rebecca Eaton on: 04/23/2018 12:04 PM   Modules accepted: Orders

## 2018-05-21 ENCOUNTER — Other Ambulatory Visit: Payer: Self-pay | Admitting: Family Medicine

## 2018-05-30 ENCOUNTER — Encounter (HOSPITAL_COMMUNITY): Payer: Self-pay | Admitting: Emergency Medicine

## 2018-05-30 ENCOUNTER — Emergency Department (HOSPITAL_COMMUNITY)
Admission: EM | Admit: 2018-05-30 | Discharge: 2018-05-30 | Disposition: A | Discharge status: Home / Self Care | Payer: Medicare Other | Attending: Emergency Medicine | Admitting: Emergency Medicine

## 2018-05-30 ENCOUNTER — Emergency Department (HOSPITAL_COMMUNITY): Payer: Medicare Other

## 2018-05-30 ENCOUNTER — Other Ambulatory Visit: Payer: Self-pay

## 2018-05-30 DIAGNOSIS — I10 Essential (primary) hypertension: Secondary | ICD-10-CM | POA: Insufficient documentation

## 2018-05-30 DIAGNOSIS — Z853 Personal history of malignant neoplasm of breast: Secondary | ICD-10-CM | POA: Diagnosis not present

## 2018-05-30 DIAGNOSIS — W19XXXA Unspecified fall, initial encounter: Secondary | ICD-10-CM

## 2018-05-30 DIAGNOSIS — Y929 Unspecified place or not applicable: Secondary | ICD-10-CM | POA: Insufficient documentation

## 2018-05-30 DIAGNOSIS — Z7982 Long term (current) use of aspirin: Secondary | ICD-10-CM | POA: Insufficient documentation

## 2018-05-30 DIAGNOSIS — S2002XA Contusion of left breast, initial encounter: Secondary | ICD-10-CM | POA: Diagnosis not present

## 2018-05-30 DIAGNOSIS — Y92014 Private driveway to single-family (private) house as the place of occurrence of the external cause: Secondary | ICD-10-CM | POA: Insufficient documentation

## 2018-05-30 DIAGNOSIS — Z87891 Personal history of nicotine dependence: Secondary | ICD-10-CM | POA: Diagnosis not present

## 2018-05-30 DIAGNOSIS — S0121XA Laceration without foreign body of nose, initial encounter: Secondary | ICD-10-CM | POA: Diagnosis present

## 2018-05-30 DIAGNOSIS — R0789 Other chest pain: Secondary | ICD-10-CM | POA: Insufficient documentation

## 2018-05-30 DIAGNOSIS — Y999 Unspecified external cause status: Secondary | ICD-10-CM | POA: Insufficient documentation

## 2018-05-30 DIAGNOSIS — Y939 Activity, unspecified: Secondary | ICD-10-CM | POA: Insufficient documentation

## 2018-05-30 DIAGNOSIS — Z79899 Other long term (current) drug therapy: Secondary | ICD-10-CM | POA: Insufficient documentation

## 2018-05-30 DIAGNOSIS — W1830XA Fall on same level, unspecified, initial encounter: Secondary | ICD-10-CM | POA: Insufficient documentation

## 2018-05-30 MED ORDER — TETANUS-DIPHTH-ACELL PERTUSSIS 5-2.5-18.5 LF-MCG/0.5 IM SUSP
0.5000 mL | Freq: Once | INTRAMUSCULAR | Status: AC
  Administered 2018-05-30: 0.5 mL via INTRAMUSCULAR
  Filled 2018-05-30: qty 0.5

## 2018-05-30 MED ORDER — LIDOCAINE HCL (PF) 1 % IJ SOLN
5.0000 mL | Freq: Once | INTRAMUSCULAR | Status: AC
  Administered 2018-05-30: 5 mL
  Filled 2018-05-30: qty 5

## 2018-05-30 NOTE — ED Provider Notes (Signed)
[facility] EMERGENCY DEPARTMENT Provider Note   CSN: [id number] Arrival date & time: 05/30/18  1222    History   Chief Complaint Chief Complaint  Patient presents with  . Fall  . Knee Pain  . Facial Injury    HPI Selena [name] is a 83 y.o. female who presents to the ED via ambulance s/p ground level fall. Pt reports she tripped and fell in her driveway about 1 hour ago, landing onto her right knee and then hitting her face on the ground. No LOC. Pt has laceration to bridge of nose with continued bleeding. Not on anticoagulants but takes a baby aspirin daily. Denies any other associated symptoms including headache, vision changes, weakness or numbness unilaterally. Unknown tetanus status.        Past Medical History:  Diagnosis Date  . Anemia    PMH  . Atrial fibrillation ([place]) 01/01/2007  . BACK PAIN, UPPER 07/09/2008  . Breast cancer, stage 1 ([place])   . Bruises easily   . Cataract    history  . Complication of anesthesia   . Diverticulitis   . DIVERTICULOSIS, COLON 12/26/2006  . Essential hypertension 10/27/2017  . Follicular non-Hodgkin's lymphoma ([place])   . Goiter    multi-nodular  . Hx of colonoscopy 2009  . LIVER FUNCTION TESTS, ABNORMAL, HX OF 12/26/2007  . Memory loss 05/16/2007  . OSTEOARTHRITIS 12/26/2006   takes Diclofenac daily but has stopped for surgery  . PONV (postoperative nausea and vomiting)   . Skin cancer   . VERTIGO 09/07/2009  . Wears dentures   . Wears glasses   . Wears hearing aid    bilateral    Patient Active Problem List   Diagnosis Date Noted  . Upper airway cough syndrome 10/27/2017  . Essential hypertension 10/27/2017  . DOE (dyspnea on exertion) 10/26/2017  . Anxiety 09/10/2014  . Insomnia 09/10/2014  . Follicular 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 ([place]) 07/31/2014  . Paraspinal mass 07/16/2014  . Breast cancer of upper-outer quadrant of right female breast ([place]) 03/21/2011  . VERTIGO 09/07/2009  .  Backache 07/09/2008  . FATIGUE 03/18/2008  . LFTs abnormal 12/26/2007  . MEMORY LOSS 05/16/2007  . ATRIAL FIBRILLATION 01/01/2007  . DIVERTICULOSIS, COLON 12/26/2006  . Osteoarthritis 12/26/2006    Past Surgical History:  Procedure Laterality Date  . ABDOMINAL HYSTERECTOMY    . ABLATION OF DYSRHYTHMIC FOCUS  2009  . APPENDECTOMY    . AUGMENTATION MAMMAPLASTY Right 2013  . BREAST BIOPSY  2013  . BREAST RECONSTRUCTION  04/14/2011   Procedure: BREAST RECONSTRUCTION;  Surgeon: Crissie Reese, MD;  Location: [facility];  Service: Plastics;  Laterality: Right;  Placement of Right Breast Tissue Expander with  use of Flex HD for Breast Reconstruction  . BREAST SURGERY     right sm, snbx  . CATARACT EXTRACTION W/ INTRAOCULAR LENS  IMPLANT, BILATERAL  2010   bilateral  . COLONOSCOPY    . COLONOSCOPY    . KNEE SURGERY  2004/2010   arthroscopic/ bilateral knee replacements  . MASTECTOMY Right 2013  . PORT-A-CATH REMOVAL N/A 12/07/2016   Procedure: REMOVAL PORT-A-CATH;  Surgeon: Rolm Bookbinder, MD;  Location: [facility];  Service: General;  Laterality: N/A;  . PORTACATH PLACEMENT Right 08/11/2014   Procedure: INSERTION PORT-A-CATH WITH ULTRASOUND;  Surgeon: Rolm Bookbinder, MD;  Location: [facility];  Service: General;  Laterality: Right;  . [place]   right  . tmi  1998  . TONSILLECTOMY  OB History   No obstetric history on file.      Home Medications    Prior to Admission medications   Medication Sig Start Date End Date Taking? Authorizing Provider  meclizine (ANTIVERT) 25 MG tablet Take 1 tablet (25 mg total) by mouth every 6 (six) hours as needed for dizziness. 03/01/18  Yes Koberlein, Steele Berg, MD  ondansetron (ZOFRAN) 4 MG tablet Take 1 tablet (4 mg total) by mouth every 8 (eight) hours as needed for nausea or vomiting. 08/23/16  Yes Marletta Lor, MD  amLODipine (NORVASC) 5 MG tablet Take 1 tablet (5 mg total) by mouth daily. 11/29/17   Caren Macadam, MD   aspirin EC 81 MG tablet Take 81 mg by mouth daily.    [provider]  famotidine (PEPCID) 20 MG tablet One at bedtime Patient taking differently: Take 20 mg by mouth at bedtime.  10/26/17   Tanda Rockers, MD  meloxicam (MOBIC) 7.5 MG tablet Take 1 tablet (7.5 mg total) by mouth daily. 04/23/18   Caren Macadam, MD  metoprolol tartrate (LOPRESSOR) 25 MG tablet Take 1 tablet (25 mg total) by mouth 3 (three) times daily. 03/19/18   Burnell Blanks, MD  pantoprazole (PROTONIX) 40 MG tablet TAKE 1 TABLET(40 MG) BY MOUTH DAILY 30 TO 60 MINUTES BEFORE FIRST MEAL OF THE DAY Patient taking differently: Take 40 mg by mouth. [place] OF THE DAY 04/19/18   Tanda Rockers, MD    Family History Family History  Problem Relation Age of Onset  . Colon cancer Mother   . Cancer Mother        colon  . Colon cancer Maternal Aunt   . Cancer Maternal Uncle   . Prostate cancer Maternal Uncle   . Prostate cancer Maternal Uncle   . Other Maternal Uncle   . Cancer Sister        kidney  . Cancer Brother        lymph node  . Anesthesia problems Neg Hx   . Hypotension Neg Hx   . Malignant hyperthermia Neg Hx   . Pseudochol deficiency Neg Hx   . Breast cancer Neg Hx     Social History Social History   Tobacco Use  . Smoking status: Former Smoker    Packs/day: 0.10    Years: 20.00    Pack years: 2.00    Types: Cigarettes    Last attempt to quit: 01/24/1978    Years since quitting: 40.3  . Smokeless tobacco: Never Used  Substance Use Topics  . Alcohol use: No  . Drug use: No     Allergies   Codeine phosphate; Adhesive [tape]; and Codeine   Review of Systems Review of Systems  Constitutional: Negative for chills and fever.  HENT: Negative for congestion.   Eyes: Negative for visual disturbance.  Respiratory: Negative for cough and shortness of breath.   Cardiovascular: Positive for chest pain (Chest wall pain).  Gastrointestinal: Negative for  abdominal pain, nausea and vomiting.  Genitourinary: Negative for flank pain.  Musculoskeletal: Positive for arthralgias (Left hand pain).  Skin: Positive for wound.  Neurological: Negative for dizziness, syncope and headaches.     Physical Exam Updated Vital Signs BP (!) 151/73 (BP Location: Left Arm)   Pulse 66   Temp 97.7 F (36.5 C) (Oral)   Resp 18   Ht 5\' 7"  (1.702 m)   Wt 93.9 kg   SpO2 97%  BMI 32.42 kg/m   Physical Exam Vitals signs and nursing note reviewed.  Constitutional:      Appearance: She is not ill-appearing.  HENT:     Head: Normocephalic.     Comments: 2 cm laceration to bridge of nose with tenderness to palpation. Negative battle sign or raccoon eyes.   Lower dentures in place; pt removed upper dentures prior to arrival    Right Ear: Tympanic membrane normal.     Left Ear: Tympanic membrane normal.     Ears:     Comments: No hemotympanum bilaterally.  Eyes:     Extraocular Movements: Extraocular movements intact.     Conjunctiva/sclera: Conjunctivae normal.     Pupils: Pupils are equal, round, and reactive to light.  Neck:     Musculoskeletal: Neck supple.  Cardiovascular:     Rate and Rhythm: Normal rate and regular rhythm.  Pulmonary:     Effort: Pulmonary effort is normal.     Breath sounds: Normal breath sounds.     Comments: Large hematoma to left breast with TTP; no tenderness to sternum or right chest wall Abdominal:     Palpations: Abdomen is soft.     Tenderness: There is no abdominal tenderness.  Musculoskeletal:     Comments: Tenderness to left hand; ROM intact throughout. Grip strength 5/5.   Skin:    General: Skin is warm and dry.  Neurological:     Mental Status: She is alert and oriented to person, place, and time.     Comments: Alert and oriented to person, place, time and situation. CN II-XII intact. Strength 5/5 in upper and lower extremities. Sensation intact throughout. Normal finger to nose and heel to shin.        ED Treatments / Results  Labs (all labs ordered are listed, but only abnormal results are displayed) Labs Reviewed - No data to display  EKG None  Radiology Dg Chest 2 View  Result Date: 05/30/2018 CLINICAL DATA:  Bruising to left chest wall status post fall EXAM: CHEST - 2 VIEW COMPARISON:  01/31/2018 FINDINGS: The cardiac silhouette is enlarged. There is no pneumothorax. No large pleural effusion. No significant area of consolidation. There is no evidence of a displaced left-sided rib fracture, however the ribs are suboptimally evaluated on this exam. IMPRESSION: No active cardiopulmonary disease.  Cardiomegaly. Electronically Signed   By: Constance Holster M.D.   On: 05/30/2018 15:35   Ct Head Wo Contrast  Result Date: 05/30/2018 CLINICAL DATA:  83 year old female status post fall EXAM: CT HEAD WITHOUT CONTRAST TECHNIQUE: Contiguous axial images were obtained from the base of the skull through the vertex without intravenous contrast. COMPARISON:  Prior brain MRI 10/25/2009 FINDINGS: Brain: No evidence of acute infarction, hemorrhage, hydrocephalus, extra-axial collection or mass lesion/mass effect. Mild periventricular hypoattenuation consistent with chronic microvascular ischemic white matter disease. Vascular: Atherosclerotic calcifications in the bilateral cavernous carotid arteries. Skull: Hyperostosis frontalis interna. No evidence of nasal bone or other facial fracture. Sinuses/Orbits: No acute finding. Other: Soft tissue swelling involving the cartilaginous portion of the nose with associated laceration. IMPRESSION: 1. No acute intracranial abnormality. 2. Soft tissue injury to the nose without evidence of underlying nasal fracture. 3. Mild chronic microvascular ischemic white matter disease. Electronically Signed   By: Jacqulynn Cadet M.D.   On: 05/30/2018 14:02   Dg Knee Complete 4 Views Left  Result Date: 05/30/2018 CLINICAL DATA:  Left knee pain after a fall in the patient's kitchen  today. Bruising anteriorly at the  knee. EXAM: LEFT KNEE - COMPLETE 4+ VIEW COMPARISON:  None. FINDINGS: There is no fracture or dislocation. There is a knee joint effusion. Components of the total knee prosthesis appear in good position with no evidence of loosening. Soft tissue prominence anterior to the knee could be chronic or could represent soft tissue contusion. IMPRESSION: No acute bone abnormality. Knee effusion. Prominent anterior soft tissues as described. Electronically Signed   By: Lorriane Shire M.D.   On: 05/30/2018 13:37    Procedures .Marland KitchenLaceration Repair Date/Time: 05/30/2018 3:14 PM Performed by: Eustaquio Maize, PA-C Authorized by: Eustaquio Maize, PA-C   Consent:    Consent obtained:  Verbal   Consent given by:  Patient   Risks discussed:  Pain and poor cosmetic result   Alternatives discussed:  No treatment Anesthesia (see MAR for exact dosages):    Anesthesia method:  Local infiltration   Local anesthetic:  Lidocaine 1% w/o epi Laceration details:    Location:  Face   Face location:  Nose   Length (cm):  2   Depth (mm):  2 Repair type:    Repair type:  Simple Pre-procedure details:    Preparation:  Patient was prepped and draped in usual sterile fashion Exploration:    Hemostasis achieved with:  Direct pressure   Wound exploration: entire depth of wound probed and visualized     Contaminated: no   Treatment:    Area cleansed with:  Betadine   Amount of cleaning:  Standard   Irrigation solution:  Sterile saline   Visualized foreign bodies/material removed: no   Skin repair:    Repair method:  Sutures   Suture size:  6-0   Suture material:  Nylon   Suture technique:  Simple interrupted   Number of sutures:  2 Approximation:    Approximation:  Close Post-procedure details:    Dressing:  Adhesive bandage   Patient tolerance of procedure:  Tolerated well, no immediate complications   (including critical care time)  Medications Ordered in ED Medications   Tdap (BOOSTRIX) injection 0.5 mL (0.5 mLs Intramuscular Given 05/30/18 1426)  lidocaine (PF) (XYLOCAINE) 1 % injection 5 mL (5 mLs Infiltration Given by Other 05/30/18 1428)     Initial Impression / Assessment and Plan / ED Course  I have reviewed the triage vital signs and the nursing notes.  Pertinent labs & imaging results that were available during my care of the patient were reviewed by me and considered in my medical decision making (see chart for details).    Pt is an 83  Year old female who presents to the ED s/p mechanical ground level fall with laceration to bridge of nose and left knee pain. No LOC. Pt is not anticoagulated. No focal neuro deficits on exam. No dizziness, chest pain, or SOB prior to fall. Will update tetanus in the ED and suture laceration after obtaining head CT to rule out bleed. DG L knee ordered as well given previous knee replacement and pain to the area s/p fall. Will reevaluate once imaging returns to do procedure.   CT head without acute abnormalities including intracranial bleed. Pt tolerated procedure well; 2 simple interrupted sutures placed and tetanus updated. While doing procedure pt complains of left chest wall pain; upon examination she has large hematoma to left breast with tenderness to palpation. No sternal TTP. No SOB. LCTAB. Will get EKG and CXR to rule out pulmonary contusion. Pt also complaining of mild pain to left hand; will get imaging of hand  as well although do not suspect any fractures at this time.   3:34 PM Case signed out to [place] Robinson, PA-C, who will dispo patient accordingly after CXR, EKG, and DG L hand obtained. Likely dispo home unless CXR with abnormality.        Final Clinical Impressions(s) / ED Diagnoses   Final diagnoses:  Fall, initial encounter  Laceration of nose with complication, initial encounter    ED Discharge Orders    None       Eustaquio Maize, PA-C 05/30/18 1551    Sherwood Gambler, MD 05/31/18  1650

## 2018-05-30 NOTE — Discharge Instructions (Addendum)
Please read instructions below.  Keep your wound clean and covered. In 24 hours, you can get your wound wet; gently clean it with soap and water, pat it dry, and reapply a clean bandage. You can take tylenol as needed for pain Follow up with your primary care or urgent care for wound recheck and suture removal in 5 days.  Return to the ER for fever, pus draining from wound, redness, or new or worsening symptoms.

## 2018-05-30 NOTE — ED Notes (Signed)
Got patient on the monitor did vitals patient is resting with call bell in reach 

## 2018-05-30 NOTE — ED Triage Notes (Signed)
Arrived via EMS patient walking on the driveway tripped and fell onto left knee and face. Laceration on nose with swelling bandaged prior to arrival by EMS. Bleeding controlled. Denies LOC alert answering and following commands appropriate.

## 2018-05-30 NOTE — ED Notes (Signed)
Spoke to pts daughter about her injuries and bruise on breast , told daughter that they were doing xrays, daughter Joana Reamer states she lives beside her mom and thaT Lindsay   If and when she is d/c

## 2018-05-30 NOTE — ED Provider Notes (Signed)
Patient transferred to yellow zone, care assumed from [facility].  See her note for full HPI and work-up.  Briefly, patient presenting after mechanical fall in her driveway with laceration to the bridge of her nose and right knee pain.  Imaging so far revealing no acute fracture or injury.  Laceration to bridge of nose is sutured.  On reevaluation, patient complained of additional rib pain and hand pain.  Care assumed pending results of this imaging.  If negative, patient safe for discharge with symptomatic management and PCP follow-up. Physical Exam  BP (!) 151/73 (BP Location: Left Arm)   Pulse 66   Temp 97.7 F (36.5 C) (Oral)   Resp 18   Ht 5\' 7"  (1.702 m)   Wt 93.9 kg   SpO2 97%   BMI 32.42 kg/m   Physical Exam Vitals signs and nursing note reviewed.  Constitutional:      General: She is not in acute distress.    Appearance: She is well-developed.  HENT:     Head: Normocephalic and atraumatic.  Eyes:     Conjunctiva/sclera: Conjunctivae normal.  Cardiovascular:     Rate and Rhythm: Normal rate.  Pulmonary:     Effort: Pulmonary effort is normal.  Abdominal:     Palpations: Abdomen is soft.  Skin:    General: Skin is warm.  Neurological:     Mental Status: She is alert.  Psychiatric:        Behavior: Behavior normal.    Results for orders placed or performed in visit on 01/31/18  T3, free  Result Value Ref Range   T3, Free 3.0 2.3 - 4.2 pg/mL  T4, free  Result Value Ref Range   Free T4 0.96 0.60 - 1.60 ng/dL  TSH  Result Value Ref Range   TSH 0.51 0.35 - 4.50 uIU/mL  CBC with Differential/Platelet  Result Value Ref Range   WBC 8.8 4.0 - 10.5 K/uL   RBC 4.43 3.87 - 5.11 Mil/uL   Hemoglobin 14.7 12.0 - 15.0 g/dL   HCT 43.4 36.0 - 46.0 %   MCV 98.0 78.0 - 100.0 fl   MCHC 33.8 30.0 - 36.0 g/dL   RDW 13.6 11.5 - 15.5 %   Platelets 200.0 150.0 - 400.0 K/uL   Neutrophils Relative % 68.7 43.0 - 77.0 %   Lymphocytes Relative 13.9 12.0 - 46.0 %   Monocytes Relative  10.5 3.0 - 12.0 %   Eosinophils Relative 5.4 (H) 0.0 - 5.0 %   Basophils Relative 1.5 0.0 - 3.0 %   Neutro Abs 6.0 1.4 - 7.7 K/uL   Lymphs Abs 1.2 0.7 - 4.0 K/uL   Monocytes Absolute 0.9 0.1 - 1.0 K/uL   Eosinophils Absolute 0.5 0.0 - 0.7 K/uL   Basophils Absolute 0.1 0.0 - 0.1 K/uL  Comprehensive metabolic panel  Result Value Ref Range   Sodium 142 135 - 145 mEq/L   Potassium 4.5 3.5 - 5.1 mEq/L   Chloride 104 96 - 112 mEq/L   CO2 30 19 - 32 mEq/L   Glucose, Bld 88 70 - 99 mg/dL   BUN 19 6 - 23 mg/dL   Creatinine, Ser 0.65 0.40 - 1.20 mg/dL   Total Bilirubin 0.9 0.2 - 1.2 mg/dL   Alkaline Phosphatase 85 39 - 117 U/L   AST 28 0 - 37 U/L   ALT 30 0 - 35 U/L   Total Protein 6.1 6.0 - 8.3 g/dL   Albumin 4.2 3.5 - 5.2 g/dL  Calcium 9.6 8.4 - 10.5 mg/dL   GFR 93.14 >60.00 mL/min   Dg Chest 2 View  Result Date: 05/30/2018 CLINICAL DATA:  Bruising to left chest wall status post fall EXAM: CHEST - 2 VIEW COMPARISON:  01/31/2018 FINDINGS: The cardiac silhouette is enlarged. There is no pneumothorax. No large pleural effusion. No significant area of consolidation. There is no evidence of a displaced left-sided rib fracture, however the ribs are suboptimally evaluated on this exam. IMPRESSION: No active cardiopulmonary disease.  Cardiomegaly. Electronically Signed   By: Constance Holster M.D.   On: 05/30/2018 15:35   Ct Head Wo Contrast  Result Date: 05/30/2018 CLINICAL DATA:  83 year old female status post fall EXAM: CT HEAD WITHOUT CONTRAST TECHNIQUE: Contiguous axial images were obtained from the base of the skull through the vertex without intravenous contrast. COMPARISON:  Prior brain MRI 10/25/2009 FINDINGS: Brain: No evidence of acute infarction, hemorrhage, hydrocephalus, extra-axial collection or mass lesion/mass effect. Mild periventricular hypoattenuation consistent with chronic microvascular ischemic white matter disease. Vascular: Atherosclerotic calcifications in the bilateral  cavernous carotid arteries. Skull: Hyperostosis frontalis interna. No evidence of nasal bone or other facial fracture. Sinuses/Orbits: No acute finding. Other: Soft tissue swelling involving the cartilaginous portion of the nose with associated laceration. IMPRESSION: 1. No acute intracranial abnormality. 2. Soft tissue injury to the nose without evidence of underlying nasal fracture. 3. Mild chronic microvascular ischemic white matter disease. Electronically Signed   By: Jacqulynn Cadet M.D.   On: 05/30/2018 14:02   Dg Knee Complete 4 Views Left  Result Date: 05/30/2018 CLINICAL DATA:  Left knee pain after a fall in the patient's kitchen today. Bruising anteriorly at the knee. EXAM: LEFT KNEE - COMPLETE 4+ VIEW COMPARISON:  None. FINDINGS: There is no fracture or dislocation. There is a knee joint effusion. Components of the total knee prosthesis appear in good position with no evidence of loosening. Soft tissue prominence anterior to the knee could be chronic or could represent soft tissue contusion. IMPRESSION: No acute bone abnormality. Knee effusion. Prominent anterior soft tissues as described. Electronically Signed   By: Lorriane Shire M.D.   On: 05/30/2018 13:37   Dg Hand Complete Left  Result Date: 05/30/2018 CLINICAL DATA:  Left hand swelling after fall. EXAM: LEFT HAND - COMPLETE 3+ VIEW COMPARISON:  None. FINDINGS: No acute fracture or dislocation. 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 Osteopenia. Mild dorsal soft tissue swelling. IMPRESSION: 1. Mild dorsal hand soft tissue swelling. No acute osseous abnormality. Electronically Signed   By: Titus Dubin M.D.   On: 05/30/2018 15:39    EKG Interpretation  Date/Time:  Wednesday May 30 2018 16:09:24 EDT Ventricular Rate:  66 PR Interval:    QRS Duration: 95 QT Interval:  427 QTC Calculation: 448 R Axis:   53 Text Interpretation:  Sinus rhythm Borderline prolonged PR interval no acute ST/T  changes Confirmed by Sherwood Gambler [phone]) on 05/30/2018 4:46:31 PM       ED Course/Procedures     Procedures  MDM  Imaging of hand and chest are negative for fracture.  Patient ambulating in the ED without issue.  Pt will be discharged with symptomatic management and instructions to follow up with PCP. Wound care discussed. Safe for discharge.  Discussed results, findings, treatment and follow up. Patient advised of return precautions. Patient verbalized understanding and agreed with plan.        Panayiotis Rainville, [place] N, PA-C 05/30/18 [place], MD 05/31/18  1650  

## 2018-05-30 NOTE — ED Notes (Signed)
ED Provider at bedside. 

## 2018-06-04 ENCOUNTER — Encounter: Payer: Self-pay | Admitting: Family Medicine

## 2018-06-04 ENCOUNTER — Ambulatory Visit: Payer: Medicare Other | Admitting: Family Medicine

## 2018-06-04 ENCOUNTER — Other Ambulatory Visit: Payer: Self-pay

## 2018-06-04 VITALS — BP 122/80 | HR 57 | Temp 97.7°F | Ht 67.0 in | Wt 204.9 lb

## 2018-06-04 DIAGNOSIS — S0121XD Laceration without foreign body of nose, subsequent encounter: Secondary | ICD-10-CM

## 2018-06-04 DIAGNOSIS — R42 Dizziness and giddiness: Secondary | ICD-10-CM

## 2018-06-04 DIAGNOSIS — W19XXXS Unspecified fall, sequela: Secondary | ICD-10-CM | POA: Diagnosis not present

## 2018-06-04 MED ORDER — MECLIZINE HCL 25 MG PO TABS: 25.0000 mg | ORAL_TABLET | Freq: Four times a day (QID) | ORAL | 2 refills | Status: DC | PRN

## 2018-06-04 MED ORDER — IPRATROPIUM BROMIDE 0.03 % NA SOLN: 2.0000 | Freq: Three times a day (TID) | NASAL | 2 refills | Status: DC | PRN

## 2018-06-04 NOTE — Progress Notes (Signed)
KHRYSTINA BONNES DOB: 06/01/1935 Encounter date: 06/04/2018  This is a 83 y.o. female who presents with Chief Complaint  Patient presents with  . Follow-up    History of present illness: In ER 05/30/18 after fall. Fell in driveway with lac to bridge of nose. Right knee pain. Was able to ambulate in ER without difficulty and was sent home. 2 sutures put on nose.    Messed up glasses, broke top plate.   Bruised left breast, hurt left shoulder. Bruising and swelling left hand - bruising is improving.   Tripped over feet in driveway; turned and went down on left hand/side. Turned to tell brother something.      Has some dizziness when she first gets up - once she is up and takes time it goes away. When she goes to bed then feels spinning with initial lying down. Has had dizziness issues for over 20 years. Not worse now than in past. Meclizine does help when she takes this. Has been through full therapy for this in past and was told she just had to live with it.   Pt presents for suture removal. Sutures were placed 5 days ago in ER.  Physical Exam  BP 122/80 (BP Location: Left Arm, Patient Position: Sitting, Cuff Size: Normal)   Pulse (!) 57   Temp 97.7 F (36.5 C) (Oral)   Ht 5\' 7"  (1.702 m)   Wt 204 lb 14.4 oz (92.9 kg)   SpO2 98%   BMI 32.09 kg/m    Constitutional: She appears well-developed and well-nourished. No distress. ____  Sutures removed without difficulty. There is some scabbing on bridge of nose with swelling of nose, but wounds are healing well without any sign of infection, drainage, erythema. There is bruising of nose, beneath eyes.  Assessment: suture removal  Plan Keep wound moist with antibiotic ointment. Warm compresses to help remove scab, but do not pick off scab.     Allergies  Allergen Reactions  . Codeine Phosphate Other (See Comments)    Hallucinations  . Adhesive [Tape]     Electrodes for EKG- skin blistering, erythema  . Codeine Other (See  Comments)    Hallucinations    Current Meds  Medication Sig  . amLODipine (NORVASC) 5 MG tablet Take 1 tablet (5 mg total) by mouth daily.  [name] aspirin EC 81 MG tablet Take 81 mg by mouth daily.  . famotidine (PEPCID) 20 MG tablet One at bedtime (Patient taking differently: Take 20 mg by mouth at bedtime. )  . meclizine (ANTIVERT) 25 MG tablet Take 1 tablet (25 mg total) by mouth every 6 (six) hours as needed for dizziness.  . meloxicam (MOBIC) 7.5 MG tablet Take 1 tablet (7.5 mg total) by mouth daily.  . metoprolol tartrate (LOPRESSOR) 25 MG tablet Take 1 tablet (25 mg total) by mouth 3 (three) times daily.  . ondansetron (ZOFRAN) 4 MG tablet Take 1 tablet (4 mg total) by mouth every 8 (eight) hours as needed for nausea or vomiting.  . pantoprazole (PROTONIX) 40 MG tablet TAKE 1 TABLET(40 MG) BY MOUTH DAILY 30 TO 60 MINUTES BEFORE FIRST MEAL OF THE DAY (Patient taking differently: Take 40 mg by mouth. 30 TO 60 MINUTES BEFORE FIRST MEAL OF THE DAY)  . [DISCONTINUED] meclizine (ANTIVERT) 25 MG tablet Take 1 tablet (25 mg total) by mouth every 6 (six) hours as needed for dizziness.    Review of Systems  Constitutional: Negative for chills, fatigue and fever.  Respiratory: Positive for  shortness of breath (feels this is at baseline). Negative for cough, chest tightness and wheezing.   Cardiovascular: Negative for chest pain, palpitations and leg swelling.  Musculoskeletal:       Left shoulder has actually felt better since her fall.  The fall has seemed to improve her range of motion.  Skin:       Significant bruising of left breast left shoulder and face.  Neurological: Positive for dizziness.    Objective:  BP 122/80 (BP Location: Left Arm, Patient Position: Sitting, Cuff Size: Normal)   Pulse (!) 57   Temp 97.7 F (36.5 C) (Oral)   Ht 5\' 7"  (1.702 m)   Wt 204 lb 14.4 oz (92.9 kg)   SpO2 98%   BMI 32.09 kg/m   Weight: 204 lb 14.4 oz (92.9 kg)   BP Readings from Last 3 Encounters:   06/04/18 122/80  05/30/18 (!) 156/73  03/19/18 (!) 150/74   Wt Readings from Last 3 Encounters:  06/04/18 204 lb 14.4 oz (92.9 kg)  05/30/18 207 lb (93.9 kg)  03/19/18 208 lb (94.3 kg)    Physical Exam Constitutional:      General: She is not in acute distress.    Appearance: She is well-developed.  Cardiovascular:     Rate and Rhythm: Normal rate and regular rhythm.     Heart sounds: Normal heart sounds. No murmur. No friction rub.  Pulmonary:     Effort: Pulmonary effort is normal. No respiratory distress.     Breath sounds: Normal breath sounds. No wheezing or rales.  Musculoskeletal:     Right lower leg: No edema.     Left lower leg: No edema.  Skin:    Comments: She has ecchymosis below both eyes.  There is edema and bruising to entire nasal bridge.  She has anterior lateral bruising on her left shoulder.  No significant tenderness with mobility of the shoulder and range of motion has significantly improved since her last visit.  She also has extensive ecchymosis of the entire left breast  Neurological:     Mental Status: She is alert and oriented to person, place, and time.  Psychiatric:        Behavior: Behavior normal.     Assessment/Plan  1. VERTIGO She will continue to take the meclizine.  This does help her with her vertigo and keeps her from feeling dizzy specifically with changes in position.  Her vertigo is most prominent in the morning and at night.  It does seem to resolve quicker when she is taking the medication regularly.  Specialty evaluation and physical therapy have not helped with this in the past.  She does feel like she tolerates it well.  This was not a cause of her recent fall. - meclizine (ANTIVERT) 25 MG tablet; Take 1 tablet (25 mg total) by mouth every 6 (six) hours as needed for dizziness.  Dispense: 100 tablet; Refill: 2  2. Laceration of nose, subsequent encounter Sutures were removed in the office today.  She tolerated this well.  Keep wound  covered with antibiotic ointment.  Let us know if any worsening.  3. Cause of injury, accidental fall, sequela She has significant ecchymosis from her fall, but is not having very much pain.  We discussed using heat to help dissipate the bruising.  Use ice for any swelling or tenderness.  Let us know if any concern for healing or delayed healing which may represent infection.    Return in about 3 months (  around 09/04/2018), or In fall, for Chronic condition visit.  Micheline Rough, MD

## 2018-06-11 ENCOUNTER — Telehealth: Payer: Self-pay | Admitting: Family Medicine

## 2018-06-11 NOTE — Telephone Encounter (Signed)
PA started on the meclizine.  This info has been sent to [facility].     Key:  [id number] Sent in via cover my meds.

## 2018-06-13 ENCOUNTER — Telehealth: Payer: Self-pay | Admitting: *Deleted

## 2018-06-13 DIAGNOSIS — N63 Unspecified lump in unspecified breast: Secondary | ICD-10-CM

## 2018-06-13 NOTE — Telephone Encounter (Signed)
Selena [name] fell directly on breast and it is completely bruised/black and blue. Huge amount of bruising. I am happy to order the diagnostic mammogram/US, but just wondering if they would recommend waiting for this to resolve. It was a very hard fall on cement and will take some time to recover.   Perhaps just check back in with Selena [name] and see how breast is feeling? If bruising has resolved and there is just residual "lump" (likely related to fall) then we can do the diagnostic with [place]. If still with bruising/tenderness please ask their opinion. From the looks of her breast at the last office visit I would not think she would tolerate a mammogram very well due to the amount of bruising/edema.

## 2018-06-13 NOTE — Telephone Encounter (Signed)
I called the [facility] and spoke with Joaquim Lai.  Joaquim Lai stated the pt is due for a regular screening mammogram.  She stated she called her and the pt told her she felt a lump and they need an order for a diagnostic mammogram and US of the left breast-code [id number] which can be entered in EPIC.  Message sent to Dr Ethlyn Gallery.

## 2018-06-13 NOTE — Telephone Encounter (Signed)
See if you can straighten out. She has Extensive and very significant bruising of that left breast from her fall. I would think they could do MRI (message says can't). I'm happy to order what I need for her to complete screening/eval.

## 2018-06-13 NOTE — Telephone Encounter (Signed)
Copied from [place] [id number]. Topic: General - Other >> Jun 13, 2018 11:44 AM Selena [name] wrote: Reason for CRM: Pt has a mass in her left breast that she fell on and it is time for her mammogram. The breast center stated they can not do an MRI but needs ok from dr to do another procedure./ Pt asked for Dr. Ethlyn Gallery to call

## 2018-06-14 NOTE — Telephone Encounter (Signed)
Noted  

## 2018-06-14 NOTE — Telephone Encounter (Signed)
I called the pt and she stated the breast is still bruised, she is in pain and does not feel she can tolerate the mammogram.  I called the [facility] and spoke with Joaquim Lai and informed her of this.  Joaquim Lai asked Judson Roch what is recommended for a patient in this case.  Judson Roch advised to enter the order for the diagnostic and note that the pt is in a lot of pain, may not be able to tolerate the mammogram and this will alert the techs to do an ultrasound instead.  Orders were entered for a diagnostic mammo and [place] and Joaquim Lai stated she will call the pt to schedule an appt.  Patient also asked for refills on Meclizine and I advised her a refill was sent on 5/11 for #100 with 2 refills.  Patient stated she asked the pharmacist for something over the counter, the Rx for Meclizine was put back and she advised an OTC med and this did not help.  I advised the pt to call the pharmacy and let them know she does want the actual prescription at this time and she agreed.

## 2018-06-14 NOTE — Telephone Encounter (Signed)
Approvedon May 19  Request Reference Number: [id number]. MECLIZINE TAB 25MG  is approved through 01/24/2019. For further questions, call [phone].

## 2018-06-14 NOTE — Addendum Note (Signed)
Addended by: Agnes Lawrence on: 06/14/2018 08:26 AM   Modules accepted: Orders

## 2018-06-15 ENCOUNTER — Ambulatory Visit
Admission: RE | Admit: 2018-06-15 | Discharge: 2018-06-15 | Disposition: A | Discharge status: Home / Self Care | Payer: Medicare Other | Source: Ambulatory Visit | Attending: Family Medicine | Admitting: Family Medicine

## 2018-06-15 ENCOUNTER — Other Ambulatory Visit: Payer: Self-pay | Admitting: Family Medicine

## 2018-06-15 ENCOUNTER — Other Ambulatory Visit: Payer: Self-pay

## 2018-06-15 DIAGNOSIS — N63 Unspecified lump in unspecified breast: Secondary | ICD-10-CM

## 2018-06-19 ENCOUNTER — Telehealth: Payer: Self-pay | Admitting: Family Medicine

## 2018-06-19 NOTE — Telephone Encounter (Signed)
I called [facility], spoke with Maren Beach and he stated the Rx is already on file and will be ready at 1pm.  I called the pt and left a detailed message at her home number with this info.

## 2018-06-19 NOTE — Telephone Encounter (Signed)
Copied from [place] [id number]. Topic: Quick Communication - Rx Refill/Question >> Jun 19, 2018 11:03 AM Leward Quan A wrote: Medication: meclizine (ANTIVERT) 25 MG tablet   Per patient the OTC did not work need Rx sent to pharmacy  Has the patient contacted their pharmacy? Yes.   (Agent: If no, request that the patient contact the pharmacy for the refill.) (Agent: If yes, when and what did the pharmacy advise?)  Preferred Pharmacy (with phone number or street name): [facility] [place], [place] - [address] [phone] (Phone) [phone] (Fax)    Agent: Please be advised that RX refills may take up to 3 business days. We ask that you follow-up with your pharmacy.

## 2018-07-11 ENCOUNTER — Telehealth: Payer: Self-pay | Admitting: Family Medicine

## 2018-07-11 NOTE — Telephone Encounter (Signed)
Stanton Kidney w/[facility] [phone] wanted the provider to know that the patient has had a couple of falls and the heart rate is irregular.  She does not see her cardiologist until August.

## 2018-07-12 NOTE — Telephone Encounter (Signed)
See if you can find out more about the falls. See how she is feeling. She does have hx of a fib, pac's,pvc's, so some irregular rhythm is not uncommon for her. When were falls; what happened? Is she having any palpitations, shortness of breath, chest discomfort? How are blood pressures looking?

## 2018-07-13 ENCOUNTER — Telehealth: Payer: Self-pay | Admitting: Cardiovascular Disease

## 2018-07-13 NOTE — Telephone Encounter (Signed)
It sounds like she needs an office visit to sort out all of her issues. Gerald Stabs

## 2018-07-13 NOTE — Telephone Encounter (Signed)
New Message            Patient is calling today to let the doctor know that she had to increase her medication and she also had a fall as well as soon swelling. Pls call to advise.

## 2018-07-13 NOTE — Telephone Encounter (Signed)
Thank you for clarification. I am aware of the falls and I do feel like she is stable from cardiac standpoint. Please have Selena [name] let us know if any new symptoms or concerns.

## 2018-07-13 NOTE — Telephone Encounter (Signed)
I called Stanton Kidney and she stated the pt told her she went to the hospital one time due to a fall approximately 2 weeks ago, fell again a few days after this and she thought Dr Ethlyn Gallery was aware of this.  Stanton Kidney stated when she checked the pts BP it was in normal range, cannot recall exact number but she does recall her heart rate was slow, it jumped really fast and the pt noticed this also and Stanton Kidney stated she suspected A-fib.  Patient told Stanton Kidney the cardiologist advised her to take an additional Metoprolol if this occurs and she did so.  Stanton Kidney stated the pt did not complain of chest pain and stated she did not feel her falls were due to vertigo as all of the symptoms the pt had sounded like vertigo.  Patient has an appt with cardiology on 6/23 and message sent to Dr Ethlyn Gallery.

## 2018-07-13 NOTE — Telephone Encounter (Signed)
I spoke with pt. She fell in driveway on May 6 and was seen in ED. [place] in garage on June 2. She was getting on riding lawn mower and next thing she knew she was on the ground.  Does not know if she passed out or foot slipped.  Scraped elbow but that has now healed. Takes meclizine for dizziness.  Dizziness has not worsened or changed recently. Was seen by [employer] nurse recently and was advised to call cardiology regarding her heart rate and swelling. Pt reports she has been taking lopressor 25 twice daily. She reports prescription is for 3 times daily but she states she was told it was written this way so she could take an extra tablet when needed. Per last office note she was to take lopressor three times daily. Pt reports she has been feeling her heart fluttering recently and has been taking extra lopressor every other day or so.  States when seen by [facility] nurse heart rate was slow and then quickly sped up. Pt also reports swelling in both lower legs for last 2-3 weeks.  She wears compression hose and keeps legs elevated when sitting.  Watches salt intake. Just started weighing daily and current weight is 209 lbs. Will send to Dr. Angelena Form for review/recommendations.

## 2018-07-13 NOTE — Telephone Encounter (Signed)
I spoke with pt and scheduled her to see Ermalinda Barrios, PA at 3:45 on June 23,2020.            COVID-19 Pre-Screening Questions:  . In the past 7 to 10 days have you had a cough,  shortness of breath, headache, congestion, fever (100 or greater) body aches, chills, sore throat, or sudden loss of taste or sense of smell? No--occasional nasal drainage since November . Have you been around anyone with known Covid 19.-No . Have you been around anyone who is awaiting Covid 19 test results in the past 7 to 10 days?-No . Have you been around anyone who has been exposed to Covid 19, or has mentioned symptoms of Covid 19 within the past 7 to 10 days? -No   Pt aware to wear mask to appointment.

## 2018-07-17 ENCOUNTER — Ambulatory Visit (INDEPENDENT_AMBULATORY_CARE_PROVIDER_SITE_OTHER): Payer: Medicare Other | Admitting: Physician Assistant

## 2018-07-17 ENCOUNTER — Encounter: Payer: Self-pay | Admitting: Physician Assistant

## 2018-07-17 ENCOUNTER — Other Ambulatory Visit: Payer: Self-pay

## 2018-07-17 ENCOUNTER — Other Ambulatory Visit: Payer: Self-pay | Admitting: Physician Assistant

## 2018-07-17 VITALS — BP 148/70 | HR 57 | Ht 67.0 in | Wt 207.4 lb

## 2018-07-17 DIAGNOSIS — I712 Thoracic aortic aneurysm, without rupture, unspecified: Secondary | ICD-10-CM

## 2018-07-17 DIAGNOSIS — I480 Paroxysmal atrial fibrillation: Secondary | ICD-10-CM

## 2018-07-17 DIAGNOSIS — I10 Essential (primary) hypertension: Secondary | ICD-10-CM

## 2018-07-17 DIAGNOSIS — Z9889 Other specified postprocedural states: Secondary | ICD-10-CM

## 2018-07-17 DIAGNOSIS — R55 Syncope and collapse: Secondary | ICD-10-CM

## 2018-07-17 DIAGNOSIS — R42 Dizziness and giddiness: Secondary | ICD-10-CM

## 2018-07-17 DIAGNOSIS — I5033 Acute on chronic diastolic (congestive) heart failure: Secondary | ICD-10-CM

## 2018-07-17 DIAGNOSIS — Z8679 Personal history of other diseases of the circulatory system: Secondary | ICD-10-CM | POA: Insufficient documentation

## 2018-07-17 DIAGNOSIS — I471 Supraventricular tachycardia: Secondary | ICD-10-CM

## 2018-07-17 HISTORY — DX: Other specified postprocedural states: Z98.890

## 2018-07-17 MED ORDER — FUROSEMIDE 20 MG PO TABS: ORAL_TABLET | ORAL | 0 refills | Status: DC

## 2018-07-17 NOTE — Telephone Encounter (Signed)
Rx sent in for 30 pills. Patient is only to take for 3 days. After lab results we will determine if she needs to be on daily or Prn. Will deny request for 90 days.

## 2018-07-17 NOTE — [facility] Notes (Signed)
Cardiology Office Note    Date:  07/17/2018   ID:  Selena [name], DOB [birth year], MRN [id number]  PCP:  Caren Macadam, MD  Cardiologist: Lauree Chandler, MD EPS: None  Chief Complaint  Patient presents with  . Loss of Consciousness    History of Present Illness:  Selena [name] is a 83 y.o. female with history of thoracic aortic aneurysm 4.2 cm 08/2017 needs repeat CTA 08/2018, history of SVT, PACs, PACs are metoprolol 25 mg twice daily takes extra as needed, hypertension, non-Hodgkin's lymphoma.  2D echo 08/2017 mild LVH normal LVEF 55 to 60% with grade 2 DD, remote history of atrial flutter in 2008 and she had a cardioversion followed by ablation by Dr. Caryl Comes SVT PACs and PVCs on cardiac monitor 02/2018  Patient added onto my schedule after falling while getting on her lawn mower.. She doesn't know if she passed out or slipped. She fell in May when walking and turned to talk to her son and she fell-lost her glasses false teeth and had to stitches on her nose. She doesn't know if she passed out. Has chronic dizziness on meclizine. [facility] nurse said HR was low but she was taking   extra lopressor for heart fluttering. Complains of heart fluttering and and racing so she's been taking a lot of extra metoprolol about every other day. Also has leg swelling for the past year. Eating a lot of canned soups since she hurt her false teeth from fall.   Past Medical History:  Diagnosis Date  . Anemia    PMH  . Atrial fibrillation ([place]) 01/01/2007  . BACK PAIN, UPPER 07/09/2008  . Breast cancer, stage 1 ([place])   . Bruises easily   . Cataract    history  . Complication of anesthesia   . Diverticulitis   . DIVERTICULOSIS, COLON 12/26/2006  . Essential hypertension 10/27/2017  . Follicular non-Hodgkin's lymphoma ([place])   . Goiter    multi-nodular  . Hx of colonoscopy 2009  . LIVER FUNCTION TESTS, ABNORMAL, HX OF 12/26/2007  . Memory loss 05/16/2007  . OSTEOARTHRITIS 12/26/2006   takes  Diclofenac daily but has stopped for surgery  . PONV (postoperative nausea and vomiting)   . Skin cancer   . VERTIGO 09/07/2009  . Wears dentures   . Wears glasses   . Wears hearing aid    bilateral    Past Surgical History:  Procedure Laterality Date  . ABDOMINAL HYSTERECTOMY    . ABLATION OF DYSRHYTHMIC FOCUS  2009  . APPENDECTOMY    . AUGMENTATION MAMMAPLASTY Right 2013  . BREAST BIOPSY  2013  . BREAST RECONSTRUCTION  04/14/2011   Procedure: BREAST RECONSTRUCTION;  Surgeon: Crissie Reese, MD;  Location: [place];  Service: Plastics;  Laterality: Right;  Placement of Right Breast Tissue Expander with  use of Flex HD for Breast Reconstruction  . BREAST SURGERY     right sm, snbx  . CATARACT EXTRACTION W/ INTRAOCULAR LENS  IMPLANT, BILATERAL  2010   bilateral  . COLONOSCOPY    . COLONOSCOPY    . KNEE SURGERY  2004/2010   arthroscopic/ bilateral knee replacements  . MASTECTOMY Right 2013  . PORT-A-CATH REMOVAL N/A 12/07/2016   Procedure: REMOVAL PORT-A-CATH;  Surgeon: Rolm Bookbinder, MD;  Location: [facility];  Service: General;  Laterality: N/A;  . PORTACATH PLACEMENT Right 08/11/2014   Procedure: INSERTION PORT-A-CATH WITH ULTRASOUND;  Surgeon: Rolm Bookbinder, MD;  Location: [facility];  Service: General;  Laterality: Right;  .  [place]   right  . tmi  1998  . TONSILLECTOMY      Current Medications: Current Meds  Medication Sig  . amLODipine (NORVASC) 5 MG tablet Take 1 tablet (5 mg total) by mouth daily.  [name] aspirin EC 81 MG tablet Take 81 mg by mouth daily.  . famotidine (PEPCID) 20 MG tablet One at bedtime (Patient taking differently: Take 20 mg by mouth at bedtime. )  . ipratropium (ATROVENT) 0.03 % nasal spray Place 2 sprays into both nostrils 3 (three) times daily as needed for rhinitis.  [name] meclizine (ANTIVERT) 25 MG tablet Take 1 tablet (25 mg total) by mouth every 6 (six) hours as needed for dizziness.  . meloxicam (MOBIC) 7.5 MG tablet Take 1 tablet (7.5  mg total) by mouth daily.  . metoprolol tartrate (LOPRESSOR) 25 MG tablet Take 1 tablet (25 mg total) by mouth 3 (three) times daily.  . ondansetron (ZOFRAN) 4 MG tablet Take 1 tablet (4 mg total) by mouth every 8 (eight) hours as needed for nausea or vomiting.  . pantoprazole (PROTONIX) 40 MG tablet TAKE 1 TABLET(40 MG) BY MOUTH DAILY 30 TO 60 MINUTES BEFORE FIRST MEAL OF THE DAY (Patient taking differently: Take 40 mg by mouth. 30 TO 60 MINUTES BEFORE FIRST MEAL OF THE DAY)     Allergies:   Codeine phosphate, Adhesive [tape], and Codeine   Social History   Socioeconomic History  . Marital status: Widowed    Spouse name: Not on file  . Number of children: Not on file  . Years of education: Not on file  . Highest education level: Not on file  Occupational History  . Not on file  Social Needs  . Financial resource strain: Not on file  . Food insecurity    Worry: Not on file    Inability: Not on file  . Transportation needs    Medical: Not on file    Non-medical: Not on file  Tobacco Use  . Smoking status: Former Smoker    Packs/day: 0.10    Years: 20.00    Pack years: 2.00    Types: Cigarettes    Quit date: 01/24/1978    Years since quitting: 40.5  . Smokeless tobacco: Never Used  Substance and Sexual Activity  . Alcohol use: No  . Drug use: No  . Sexual activity: Yes    Birth control/protection: Surgical  Lifestyle  . Physical activity    Days per week: Not on file    Minutes per session: Not on file  . Stress: Not on file  Relationships  . Social Herbalist on phone: Not on file    Gets together: Not on file    Attends religious service: Not on file    Active member of club or organization: Not on file    Attends meetings of clubs or organizations: Not on file    Relationship status: Not on file  Other Topics Concern  . Not on file  Social History Narrative  . Not on file     Family History:  The patient's   family history includes Cancer in her  brother, maternal uncle, mother, and sister; Colon cancer in her maternal aunt and mother; Other in her maternal uncle; Prostate cancer in her maternal uncle and maternal uncle.   ROS:   Please see the history of present illness.    ROS All other systems reviewed and are negative.   PHYSICAL EXAM:  VS:  BP (!) 148/70   Pulse (!) 57   Ht 5\' 7"  (1.702 m)   Wt 207 lb 6.4 oz (94.1 kg)   SpO2 95%   BMI 32.48 kg/m   Physical Exam  GEN: Well nourished, well developed, in no acute distress  Neck: no JVD, carotid bruits, or masses Cardiac:RRR; no murmurs, rubs, or gallops  Respiratory:  clear to auscultation bilaterally, normal work of breathing GI: soft, nontender, nondistended, + BS BMW:UXLK 1-2 edema right greater left without cyanosis, clubbing,  Good distal pulses bilaterally Neuro:  Alert and Oriented x 3 Psych: euthymic mood, full affect  Wt Readings from Last 3 Encounters:  07/17/18 207 lb 6.4 oz (94.1 kg)  06/04/18 204 lb 14.4 oz (92.9 kg)  05/30/18 207 lb (93.9 kg)      Studies/Labs Reviewed:   EKG:  EKG is  ordered today.  The ekg ordered today demonstrates  Atypical atrial flutter at 57/m   Recent Labs: 09/22/2017: Pro B Natriuretic peptide (BNP) 209.0 01/31/2018: ALT 30; BUN 19; Creatinine, Ser 0.65; Hemoglobin 14.7; Platelets 200.0; Potassium 4.5; Sodium 142; TSH 0.51   Lipid Panel    Component Value Date/Time   CHOL 169 08/28/2017 1410   TRIG 103.0 08/28/2017 1410   HDL 43.20 08/28/2017 1410   CHOLHDL 4 08/28/2017 1410   VLDL 20.6 08/28/2017 1410   LDLCALC 105 (H) 08/28/2017 1410   LDLDIRECT 133.0 08/23/2016 1403    Additional studies/ records that were reviewed today include:    8/2019Study Conclusions   - Left ventricle: The cavity size was normal. Wall thickness was   increased in a pattern of mild LVH. Systolic function was normal.   The estimated ejection fraction was in the range of 55% to 60%.   Wall motion was normal; there were no regional wall  motion   abnormalities. Features are consistent with a pseudonormal left   ventricular filling pattern, with concomitant abnormal relaxation   and increased filling pressure (grade 2 diastolic dysfunction). - Left atrium: The atrium was mildly dilated. Volume/bsa, ES,   (1-plane Simpson&'s, A2C): 35 ml/m^2.   Monitor 48 hrs 02/2018 Normal sinus rhythm Rare premature ventricular contractions Premature atrial contractions Several runs of supraventricular tachycardia (longest 66 beats) No evidence of atrial fibrillation  Aflutter ablation 2008  IMPRESSION:  1. Normal sinus function.  2. Abnormal atrial function manifested by sustained atrial-typical      flutter.  Catheter ablation successfully interrupted cava tricuspid      isthmus conduction and thereby eliminated the substrate from the      patient's clinical arrhythmia.  3. Normal AV nodal function.  4. Interval His-Purkinje system function.  5. No accessory pathway.  6. Normal ventricular response to programmed stimulation.    SUMMARY OF CONCLUSION:  Results of electrophysiological testing  confirmed cava tricuspid isthmus conduction the presumptive substrate  from the patient's typical atrial flutter.  Catheter ablation across the  cava tricuspid isthmus interrupted conduction bidirectionally and  thereby the substrate.  The patient tolerated the procedure well without  apparent complication.  The patient was transferred to the holding area  as noted previously for sheath removal.   IMPRESSION:  1. Atrial flutter - typical with unusual 3:1 conduction pattern.  The      patient underwent TE cardioversion this morning with normal left      ventricular function and no left atrial appendage thrombus.  Sinus      rhythm was restored.  2. Thromboembolic risk factors are broadly  negative.  3. Musculoskeletal disease as noted previously.    DISCUSSION:  Mrs. Grossi has atrial flutter, it is invariably  recurrent.  A [place] score  of zero while low enough to not prompt the use  of Coumadin given its own issues is associated with a published stroke  risk of about 1.8%.  The patient, her daughter-in-law and I discussed  this as well as potential benefits of anticoagulation versus potential  benefits of catheter ablation with elimination of the substrate for the  patient's clinical arrhythmia and presumably thereby the associated risk  of thromboembolism.    We will plan to let her go home.  We will schedule her to be seen in the  office the first week in January and then we will tentatively set her up  for ablation in the second week in January having discontinued her  Coumadin in anticipation of that procedure.   ASSESSMENT:    1. Syncope, unspecified syncope type   2. S/P ablation of atrial flutter   3. Essential hypertension   4. Thoracic aortic aneurysm without rupture ([employer])   5. Acute on chronic diastolic CHF (congestive heart failure) ([place])   6. SVT (supraventricular tachycardia) ([place])   7. Dizziness   8. Paroxysmal atrial fibrillation ([name])      PLAN:  In order of problems listed above:  Syncope twice in the past 1 1/2 months with recurrent atrial flutter on EKG today. SVT on holter in Feb. Discussed with Dr. Lovena Le. Will place 14 day Zio and f/u with him for possible pacemaker.   History of Atrial flutter ablation by Dr. Caryl Comes 2008  Acute on chronic diastolic CHF-grade 2 DD on echo and eating canned soup since she broke her dentures with fall. Will add Lasix 20 mg daily for 3 days and 2 gm sodium diet.  Thoracic aortic aneurysm 4.2 cm needs CT f/u 08/2018  Essential HTN controlled  SVT,  PAC/PVC on monitor 2.2020  Dizziness-chronic on meclizine       Medication Adjustments/Labs and Tests Ordered: Current medicines are reviewed at length with the patient today.  Concerns regarding medicines are outlined above.  Medication changes, Labs and Tests ordered today are listed in the Patient  Instructions below. Patient Instructions   Medication Instructions:  Your physician has recommended you make the following change in your medication:   TAKE: furosemide (lasix) 20 mg tablet: Take 1 tablet once a day for 3 days only   Lab work: TODAY: CMET, CBC, BNP  If you have labs (blood work) drawn today and your tests are completely normal, you will receive your results only by: [name] MyChart Message (if you have MyChart) OR . A paper copy in the mail If you have any lab test that is abnormal or we need to change your treatment, we will call you to review the results.  Testing/Procedures: Your physician has recommended that you wear a 14 day monitor. These monitors are medical devices that record the heart's electrical activity. Doctors most often use these monitors to diagnose arrhythmias. Arrhythmias are problems with the speed or rhythm of the heartbeat. The monitor is a small, portable device. You can wear one while you do your normal daily activities. This is usually used to diagnose what is causing palpitations/syncope (passing out).  Follow-Up: . Follow up with Dr. Lovena Le on 08/21/18 at 12:00 PM  Any Other Special Instructions Will Be Listed Below (If Applicable).   Two Gram Sodium Diet 2000 mg  What is Sodium? Sodium  is a mineral found naturally in many foods. The most significant source of sodium in the diet is table salt, which is about 40% sodium.  Processed, convenience, and preserved foods also contain a large amount of sodium.  The body needs only 500 mg of sodium daily to function,  A normal diet provides more than enough sodium even if you do not use salt.  Why Limit Sodium? A build up of sodium in the body can cause thirst, increased blood pressure, shortness of breath, and water retention.  Decreasing sodium in the diet can reduce edema and risk of heart attack or stroke associated with high blood pressure.  Keep in mind that there are many other factors involved in  these health problems.  Heredity, obesity, lack of exercise, cigarette smoking, stress and what you eat all play a role.  General Guidelines:  Do not add salt at the table or in cooking.  One teaspoon of salt contains over 2 grams of sodium.  Read food labels  Avoid processed and convenience foods  Ask your dietitian before eating any foods not dicussed in the menu planning guidelines  Consult your physician if you wish to use a salt substitute or a sodium containing medication such as antacids.  Limit milk and milk products to 16 oz (2 cups) per day.  Shopping Hints:  READ LABELS!! "Dietetic" does not necessarily mean low sodium.  Salt and other sodium ingredients are often added to foods during processing.   Menu Planning Guidelines Food Group Choose More Often Avoid  Beverages (see also the milk group All fruit juices, low-sodium, salt-free vegetables juices, low-sodium carbonated beverages Regular vegetable or tomato juices, commercially softened water used for drinking or cooking  Breads and Cereals Enriched white, wheat, rye and pumpernickel bread, hard rolls and dinner rolls; muffins, cornbread and waffles; most dry cereals, cooked cereal without added salt; unsalted crackers and breadsticks; low sodium or homemade bread crumbs Bread, rolls and crackers with salted tops; quick breads; instant hot cereals; pancakes; commercial bread stuffing; self-rising flower and biscuit mixes; regular bread crumbs or cracker crumbs  Desserts and Sweets Desserts and sweets mad with mild should be within allowance Instant pudding mixes and cake mixes  Fats Butter or margarine; vegetable oils; unsalted salad dressings, regular salad dressings limited to 1 Tbs; light, sour and heavy cream Regular salad dressings containing bacon fat, bacon bits, and salt pork; snack dips made with instant soup mixes or processed cheese; salted nuts  Fruits Most fresh, frozen and canned fruits Fruits processed with  salt or sodium-containing ingredient (some dried fruits are processed with sodium sulfites        Vegetables Fresh, frozen vegetables and low- sodium canned vegetables Regular canned vegetables, sauerkraut, pickled vegetables, and others prepared in brine; frozen vegetables in sauces; vegetables seasoned with ham, bacon or salt pork  Condiments, Sauces, Miscellaneous  Salt substitute with physician's approval; pepper, herbs, spices; vinegar, lemon or lime juice; hot pepper sauce; garlic powder, onion powder, low sodium soy sauce (1 Tbs.); low sodium condiments (ketchup, chili sauce, mustard) in limited amounts (1 tsp.) fresh ground horseradish; unsalted tortilla chips, pretzels, potato chips, popcorn, salsa (1/4 cup) Any seasoning made with salt including garlic salt, celery salt, onion salt, and seasoned salt; sea salt, rock salt, kosher salt; meat tenderizers; monosodium glutamate; mustard, regular soy sauce, barbecue, sauce, chili sauce, teriyaki sauce, steak sauce, Worcestershire sauce, and most flavored vinegars; canned gravy and mixes; regular condiments; salted snack foods, olives, picles, relish, horseradish sauce,  catsup   Food preparation: Try these seasonings Meats:    Pork Sage, onion Serve with applesauce  Chicken Poultry seasoning, thyme, parsley Serve with cranberry sauce  Lamb Curry powder, rosemary, garlic, thyme Serve with mint sauce or jelly  Veal Marjoram, basil Serve with current jelly, cranberry sauce  Beef Pepper, bay leaf Serve with dry mustard, unsalted chive butter  Fish [place] leaf, dill Serve with unsalted lemon butter, unsalted parsley butter  Vegetables:    Asparagus Lemon juice   Broccoli Lemon juice   Carrots Mustard dressing parsley, mint, nutmeg, glazed with unsalted butter and sugar   Green beans Marjoram, lemon juice, nutmeg,dill seed   Tomatoes Basil, marjoram, onion   Spice /blend for [employer]" 4 tsp ground thyme 1 tsp ground sage 3 tsp ground rosemary  4 tsp ground marjoram   Test your knowledge 1. A product that says "Salt Free" may still contain sodium. True or False 2. Garlic Powder and Hot Pepper Sauce an be used as alternative seasonings.True or False 3. Processed foods have more sodium than fresh foods.  True or False 4. Canned Vegetables have less sodium than froze True or False  WAYS TO DECREASE YOUR SODIUM INTAKE 1. Avoid the use of added salt in cooking and at the table.  Table salt (and other prepared seasonings which contain salt) is probably one of the greatest sources of sodium in the diet.  Unsalted foods can gain flavor from the sweet, sour, and butter taste sensations of herbs and spices.  Instead of using salt for seasoning, try the following seasonings with the foods listed.  Remember: how you use them to enhance natural food flavors is limited only by your creativity... Allspice-Meat, fish, eggs, fruit, peas, red and yellow vegetables Almond Extract-Fruit baked goods Anise Seed-Sweet breads, fruit, carrots, beets, cottage cheese, cookies (tastes like licorice) Basil-Meat, fish, eggs, vegetables, rice, vegetables salads, soups, sauces Bay Leaf-Meat, fish, stews, poultry Burnet-Salad, vegetables (cucumber-like flavor) Caraway Seed-Bread, cookies, cottage cheese, meat, vegetables, cheese, rice Cardamon-Baked goods, fruit, soups Celery Powder or seed-Salads, salad dressings, sauces, meatloaf, soup, bread.Do not use  celery salt Chervil-Meats, salads, fish, eggs, vegetables, cottage cheese (parsley-like flavor) Chili Power-Meatloaf, chicken cheese, corn, eggplant, egg dishes Chives-Salads cottage cheese, egg dishes, soups, vegetables, sauces Cilantro-Salsa, casseroles Cinnamon-Baked goods, fruit, pork, lamb, chicken, carrots Cloves-Fruit, baked goods, fish, pot roast, green beans, beets, carrots Coriander-Pastry, cookies, meat, salads, cheese (lemon-orange flavor) Cumin-Meatloaf, fish,cheese, eggs, cabbage,fruit pie  (caraway flavor) [employer], fruit, eggs, fish, poultry, cottage cheese, vegetables Dill Seed-Meat, cottage cheese, poultry, vegetables, fish, salads, bread Fennel Seed-Bread, cookies, apples, pork, eggs, fish, beets, cabbage, cheese, Licorice-like flavor Garlic-(buds or powder) Salads, meat, poultry, fish, bread, butter, vegetables, potatoes.Do not  use garlic salt Ginger-Fruit, vegetables, baked goods, meat, fish, poultry Horseradish Root-Meet, vegetables, butter Lemon Juice or Extract-Vegetables, fruit, tea, baked goods, fish salads Mace-Baked goods fruit, vegetables, fish, poultry (taste like nutmeg) Maple Extract-Syrups Marjoram-Meat, chicken, fish, vegetables, breads, green salads (taste like Sage) Mint-Tea, lamb, sherbet, vegetables, desserts, carrots, cabbage Mustard, Dry or Seed-Cheese, eggs, meats, vegetables, poultry Nutmeg-Baked goods, fruit, chicken, eggs, vegetables, desserts Onion Powder-Meat, fish, poultry, vegetables, cheese, eggs, bread, rice salads (Do not use   Onion salt) Orange Extract-Desserts, baked goods Oregano-Pasta, eggs, cheese, onions, pork, lamb, fish, chicken, vegetables, green salads Paprika-Meat, fish, poultry, eggs, cheese, vegetables Parsley Flakes-Butter, vegetables, meat fish, poultry, eggs, bread, salads (certain forms may   Contain sodium Pepper-Meat fish, poultry, vegetables, eggs Peppermint Extract-Desserts, baked goods Poppy Seed-Eggs, bread, cheese, fruit  dressings, baked goods, noodles, vegetables, cottage  [employer], poultry, meat, fish, cauliflower, turnips,eggs bread Saffron-Rice, bread, veal, chicken, fish, eggs Sage-Meat, fish, poultry, onions, eggplant, tomateos, pork, stews Savory-Eggs, salads, poultry, meat, rice, vegetables, soups, pork Tarragon-Meat, poultry, fish, eggs, butter, vegetables (licorice-like flavor)  Thyme-Meat, poultry, fish, eggs, vegetables, (clover-like flavor),  sauces, soups Tumeric-Salads, butter, eggs, fish, rice, vegetables (saffron-like flavor) Vanilla Extract-Baked goods, candy Vinegar-Salads, vegetables, meat marinades Walnut Extract-baked goods, candy  2. Choose your Foods Wisely   The following is a list of foods to avoid which are high in sodium:  Meats-Avoid all smoked, canned, salt cured, dried and kosher meat and fish as well as Anchovies   [employer] meats:Bologna, Liverwurst, Pastrami Canned meat or fish  Marinated herring Caviar    Pepperoni Corned Beef   Pizza Dried chipped beef  Salami Frozen breaded fish or meat Salt pork Frankfurters or hot dogs  Sardines Gefilte fish   Sausage Ham (boiled ham, Proscuitto Smoked butt    spiced ham)   Spam      TV Dinners Vegetables Canned vegetables (Regular) Relish Canned mushrooms  Sauerkraut Olives    Tomato juice Pickles  Bakery and Dessert Products Canned puddings  Cream pies Cheesecake   Decorated cakes Cookies  Beverages/Juices Tomato juice, regular  Gatorade   V-8 vegetable juice, regular  Breads and Cereals Biscuit mixes   Salted potato chips, corn chips, pretzels Bread stuffing mixes  Salted crackers and rolls Pancake and waffle mixes Self-rising flour  Seasonings Accent    Meat sauces Barbecue sauce  Meat tenderizer Catsup    Monosodium glutamate (MSG) Celery salt   Onion salt Chili sauce   Prepared mustard Garlic salt   Salt, seasoned salt, sea salt Gravy mixes   Soy sauce Horseradish   Steak sauce Ketchup   Tartar sauce Lite salt    Teriyaki sauce Marinade mixes   Worcestershire sauce  Others Baking powder   Cocoa and cocoa mixes Baking soda   Commercial casserole mixes Candy-caramels, chocolate  Dehydrated soups    Bars, fudge,nougats  Instant rice and pasta mixes Canned broth or soup  Maraschino cherries Cheese, aged and processed cheese and cheese spreads  Learning Assessment Quiz  Indicated T (for True) or F (for False) for each  of the following statements:  1. _____ Fresh fruits and vegetables and unprocessed grains are generally low in sodium 2. _____ Water may contain a considerable amount of sodium, depending on the source 3. _____ You can always tell if a food is high in sodium by tasting it 4. _____ Certain laxatives my be high in sodium and should be avoided unless prescribed   by a physician or pharmacist 5. _____ Salt substitutes may be used freely by anyone on a sodium restricted diet 6. _____ Sodium is present in table salt, food additives and as a natural component of   most foods 7. _____ Table salt is approximately 90% sodium 8. _____ Limiting sodium intake may help prevent excess fluid accumulation in the body 9. _____ On a sodium-restricted diet, seasonings such as bouillon soy sauce, and    cooking wine should be used in place of table salt 10. _____ On an ingredient list, a product which lists monosodium glutamate as the first   ingredient is an appropriate food to include on a low sodium diet  Circle the best answer(s) to the following statements (Hint: there may be more than one correct answer)  11. On  a low-sodium diet, some acceptable snack items are:    A. Olives  F. Bean dip   K. Grapefruit juice    B. Salted Pretzels G. Commercial Popcorn   L. Canned peaches    C. Carrot Sticks  H. Bouillon   M. Unsalted nuts   D. [place] fries  I. Peanut butter crackers N. Salami   E. Sweet pickles J. Tomato Juice   O. Pizza  12.  Seasonings that may be used freely on a reduced - sodium diet include   A. Lemon wedges F.Monosodium glutamate K. Celery seed    B.Soysauce   G. Pepper   L. Mustard powder   C. Sea salt  H. Cooking wine  M. Onion flakes   D. Vinegar  E. Prepared horseradish N. Salsa   E. Sage   J. Worcestershire sauce  O. Chutney     Signed, Ermalinda Barrios, PA-C  07/17/2018 1:10 PM    [facility] [place], [place], [place]  [address] Phone: [phone]; Fax: [phone]

## 2018-07-17 NOTE — Patient Instructions (Addendum)
Medication Instructions:  Your physician has recommended you make the following change in your medication:   TAKE: furosemide (lasix) 20 mg tablet: Take 1 tablet once a day for 3 days only   Lab work: TODAY: CMET, CBC, BNP  If you have labs (blood work) drawn today and your tests are completely normal, you will receive your results only by: [name] MyChart Message (if you have MyChart) OR . A paper copy in the mail If you have any lab test that is abnormal or we need to change your treatment, we will call you to review the results.  Testing/Procedures: Your physician has recommended that you wear a 14 day monitor. These monitors are medical devices that record the heart's electrical activity. Doctors most often use these monitors to diagnose arrhythmias. Arrhythmias are problems with the speed or rhythm of the heartbeat. The monitor is a small, portable device. You can wear one while you do your normal daily activities. This is usually used to diagnose what is causing palpitations/syncope (passing out).  Follow-Up: . Follow up with Dr. Lovena Le on 08/21/18 at 12:00 PM  Any Other Special Instructions Will Be Listed Below (If Applicable).   Two Gram Sodium Diet 2000 mg  What is Sodium? Sodium is a mineral found naturally in many foods. The most significant source of sodium in the diet is table salt, which is about 40% sodium.  Processed, convenience, and preserved foods also contain a large amount of sodium.  The body needs only 500 mg of sodium daily to function,  A normal diet provides more than enough sodium even if you do not use salt.  Why Limit Sodium? A build up of sodium in the body can cause thirst, increased blood pressure, shortness of breath, and water retention.  Decreasing sodium in the diet can reduce edema and risk of heart attack or stroke associated with high blood pressure.  Keep in mind that there are many other factors involved in these health problems.  Heredity, obesity, lack  of exercise, cigarette smoking, stress and what you eat all play a role.  General Guidelines:  Do not add salt at the table or in cooking.  One teaspoon of salt contains over 2 grams of sodium.  Read food labels  Avoid processed and convenience foods  Ask your dietitian before eating any foods not dicussed in the menu planning guidelines  Consult your physician if you wish to use a salt substitute or a sodium containing medication such as antacids.  Limit milk and milk products to 16 oz (2 cups) per day.  Shopping Hints:  READ LABELS!! "Dietetic" does not necessarily mean low sodium.  Salt and other sodium ingredients are often added to foods during processing.   Menu Planning Guidelines Food Group Choose More Often Avoid  Beverages (see also the milk group All fruit juices, low-sodium, salt-free vegetables juices, low-sodium carbonated beverages Regular vegetable or tomato juices, commercially softened water used for drinking or cooking  Breads and Cereals Enriched white, wheat, rye and pumpernickel bread, hard rolls and dinner rolls; muffins, cornbread and waffles; most dry cereals, cooked cereal without added salt; unsalted crackers and breadsticks; low sodium or homemade bread crumbs Bread, rolls and crackers with salted tops; quick breads; instant hot cereals; pancakes; commercial bread stuffing; self-rising flower and biscuit mixes; regular bread crumbs or cracker crumbs  Desserts and Sweets Desserts and sweets mad with mild should be within allowance Instant pudding mixes and cake mixes  Fats Butter or margarine; vegetable oils; unsalted  salad dressings, regular salad dressings limited to 1 Tbs; light, sour and heavy cream Regular salad dressings containing bacon fat, bacon bits, and salt pork; snack dips made with instant soup mixes or processed cheese; salted nuts  Fruits Most fresh, frozen and canned fruits Fruits processed with salt or sodium-containing ingredient (some dried  fruits are processed with sodium sulfites        Vegetables Fresh, frozen vegetables and low- sodium canned vegetables Regular canned vegetables, sauerkraut, pickled vegetables, and others prepared in brine; frozen vegetables in sauces; vegetables seasoned with ham, bacon or salt pork  Condiments, Sauces, Miscellaneous  Salt substitute with physician's approval; pepper, herbs, spices; vinegar, lemon or lime juice; hot pepper sauce; garlic powder, onion powder, low sodium soy sauce (1 Tbs.); low sodium condiments (ketchup, chili sauce, mustard) in limited amounts (1 tsp.) fresh ground horseradish; unsalted tortilla chips, pretzels, potato chips, popcorn, salsa (1/4 cup) Any seasoning made with salt including garlic salt, celery salt, onion salt, and seasoned salt; sea salt, rock salt, kosher salt; meat tenderizers; monosodium glutamate; mustard, regular soy sauce, barbecue, sauce, chili sauce, teriyaki sauce, steak sauce, Worcestershire sauce, and most flavored vinegars; canned gravy and mixes; regular condiments; salted snack foods, olives, picles, relish, horseradish sauce, catsup   Food preparation: Try these seasonings Meats:    Pork Sage, onion Serve with applesauce  Chicken Poultry seasoning, thyme, parsley Serve with cranberry sauce  Lamb Curry powder, rosemary, garlic, thyme Serve with mint sauce or jelly  Veal Marjoram, basil Serve with current jelly, cranberry sauce  Beef Pepper, bay leaf Serve with dry mustard, unsalted chive butter  Fish [place] leaf, dill Serve with unsalted lemon butter, unsalted parsley butter  Vegetables:    Asparagus Lemon juice   Broccoli Lemon juice   Carrots Mustard dressing parsley, mint, nutmeg, glazed with unsalted butter and sugar   Green beans Marjoram, lemon juice, nutmeg,dill seed   Tomatoes Basil, marjoram, onion   Spice /blend for [employer]" 4 tsp ground thyme 1 tsp ground sage 3 tsp ground rosemary 4 tsp ground marjoram   Test your  knowledge 1. A product that says "Salt Free" may still contain sodium. True or False 2. Garlic Powder and Hot Pepper Sauce an be used as alternative seasonings.True or False 3. Processed foods have more sodium than fresh foods.  True or False 4. Canned Vegetables have less sodium than froze True or False  WAYS TO DECREASE YOUR SODIUM INTAKE 1. Avoid the use of added salt in cooking and at the table.  Table salt (and other prepared seasonings which contain salt) is probably one of the greatest sources of sodium in the diet.  Unsalted foods can gain flavor from the sweet, sour, and butter taste sensations of herbs and spices.  Instead of using salt for seasoning, try the following seasonings with the foods listed.  Remember: how you use them to enhance natural food flavors is limited only by your creativity... Allspice-Meat, fish, eggs, fruit, peas, red and yellow vegetables Almond Extract-Fruit baked goods Anise Seed-Sweet breads, fruit, carrots, beets, cottage cheese, cookies (tastes like licorice) Basil-Meat, fish, eggs, vegetables, rice, vegetables salads, soups, sauces Bay Leaf-Meat, fish, stews, poultry Burnet-Salad, vegetables (cucumber-like flavor) Caraway Seed-Bread, cookies, cottage cheese, meat, vegetables, cheese, rice Cardamon-Baked goods, fruit, soups Celery Powder or seed-Salads, salad dressings, sauces, meatloaf, soup, bread.Do not use  celery salt Chervil-Meats, salads, fish, eggs, vegetables, cottage cheese (parsley-like flavor) Chili Power-Meatloaf, chicken cheese, corn, eggplant, egg dishes Chives-Salads cottage cheese, egg dishes, soups, vegetables,  sauces Cilantro-Salsa, casseroles Cinnamon-Baked goods, fruit, pork, lamb, chicken, carrots Cloves-Fruit, baked goods, fish, pot roast, green beans, beets, carrots Coriander-Pastry, cookies, meat, salads, cheese (lemon-orange flavor) Cumin-Meatloaf, fish,cheese, eggs, cabbage,fruit pie (caraway flavor) [employer], fruit,  eggs, fish, poultry, cottage cheese, vegetables Dill Seed-Meat, cottage cheese, poultry, vegetables, fish, salads, bread Fennel Seed-Bread, cookies, apples, pork, eggs, fish, beets, cabbage, cheese, Licorice-like flavor Garlic-(buds or powder) Salads, meat, poultry, fish, bread, butter, vegetables, potatoes.Do not  use garlic salt Ginger-Fruit, vegetables, baked goods, meat, fish, poultry Horseradish Root-Meet, vegetables, butter Lemon Juice or Extract-Vegetables, fruit, tea, baked goods, fish salads Mace-Baked goods fruit, vegetables, fish, poultry (taste like nutmeg) Maple Extract-Syrups Marjoram-Meat, chicken, fish, vegetables, breads, green salads (taste like Sage) Mint-Tea, lamb, sherbet, vegetables, desserts, carrots, cabbage Mustard, Dry or Seed-Cheese, eggs, meats, vegetables, poultry Nutmeg-Baked goods, fruit, chicken, eggs, vegetables, desserts Onion Powder-Meat, fish, poultry, vegetables, cheese, eggs, bread, rice salads (Do not use   Onion salt) Orange Extract-Desserts, baked goods Oregano-Pasta, eggs, cheese, onions, pork, lamb, fish, chicken, vegetables, green salads Paprika-Meat, fish, poultry, eggs, cheese, vegetables Parsley Flakes-Butter, vegetables, meat fish, poultry, eggs, bread, salads (certain forms may   Contain sodium Pepper-Meat fish, poultry, vegetables, eggs Peppermint Extract-Desserts, baked goods Poppy Seed-Eggs, bread, cheese, fruit dressings, baked goods, noodles, vegetables, cottage  [employer], poultry, meat, fish, cauliflower, turnips,eggs bread Saffron-Rice, bread, veal, chicken, fish, eggs Sage-Meat, fish, poultry, onions, eggplant, tomateos, pork, stews Savory-Eggs, salads, poultry, meat, rice, vegetables, soups, pork Tarragon-Meat, poultry, fish, eggs, butter, vegetables (licorice-like flavor)  Thyme-Meat, poultry, fish, eggs, vegetables, (clover-like flavor), sauces, soups Tumeric-Salads, butter, eggs,  fish, rice, vegetables (saffron-like flavor) Vanilla Extract-Baked goods, candy Vinegar-Salads, vegetables, meat marinades Walnut Extract-baked goods, candy  2. Choose your Foods Wisely   The following is a list of foods to avoid which are high in sodium:  Meats-Avoid all smoked, canned, salt cured, dried and kosher meat and fish as well as Anchovies   [employer] meats:Bologna, Liverwurst, Pastrami Canned meat or fish  Marinated herring Caviar    Pepperoni Corned Beef   Pizza Dried chipped beef  Salami Frozen breaded fish or meat Salt pork Frankfurters or hot dogs  Sardines Gefilte fish   Sausage Ham (boiled ham, Proscuitto Smoked butt    spiced ham)   Spam      TV Dinners Vegetables Canned vegetables (Regular) Relish Canned mushrooms  Sauerkraut Olives    Tomato juice Pickles  Bakery and Dessert Products Canned puddings  Cream pies Cheesecake   Decorated cakes Cookies  Beverages/Juices Tomato juice, regular  Gatorade   V-8 vegetable juice, regular  Breads and Cereals Biscuit mixes   Salted potato chips, corn chips, pretzels Bread stuffing mixes  Salted crackers and rolls Pancake and waffle mixes Self-rising flour  Seasonings Accent    Meat sauces Barbecue sauce  Meat tenderizer Catsup    Monosodium glutamate (MSG) Celery salt   Onion salt Chili sauce   Prepared mustard Garlic salt   Salt, seasoned salt, sea salt Gravy mixes   Soy sauce Horseradish   Steak sauce Ketchup   Tartar sauce Lite salt    Teriyaki sauce Marinade mixes   Worcestershire sauce  Others Baking powder   Cocoa and cocoa mixes Baking soda   Commercial casserole mixes Candy-caramels, chocolate  Dehydrated soups    Bars, fudge,nougats  Instant rice and pasta mixes Canned broth or soup  Maraschino cherries Cheese, aged and processed cheese and cheese spreads  Learning Assessment Quiz  Indicated T (  for True) or F (for False) for each of the following  statements:  1. _____ Fresh fruits and vegetables and unprocessed grains are generally low in sodium 2. _____ Water may contain a considerable amount of sodium, depending on the source 3. _____ You can always tell if a food is high in sodium by tasting it 4. _____ Certain laxatives my be high in sodium and should be avoided unless prescribed   by a physician or pharmacist 5. _____ Salt substitutes may be used freely by anyone on a sodium restricted diet 6. _____ Sodium is present in table salt, food additives and as a natural component of   most foods 7. _____ Table salt is approximately 90% sodium 8. _____ Limiting sodium intake may help prevent excess fluid accumulation in the body 9. _____ On a sodium-restricted diet, seasonings such as bouillon soy sauce, and    cooking wine should be used in place of table salt 10. _____ On an ingredient list, a product which lists monosodium glutamate as the first   ingredient is an appropriate food to include on a low sodium diet  Circle the best answer(s) to the following statements (Hint: there may be more than one correct answer)  11. On a low-sodium diet, some acceptable snack items are:    A. Olives  F. Bean dip   K. Grapefruit juice    B. Salted Pretzels G. Commercial Popcorn   L. Canned peaches    C. Carrot Sticks  H. Bouillon   M. Unsalted nuts   D. [place] fries  I. Peanut butter crackers N. Salami   E. Sweet pickles J. Tomato Juice   O. Pizza  12.  Seasonings that may be used freely on a reduced - sodium diet include   A. Lemon wedges F.Monosodium glutamate K. Celery seed    B.Soysauce   G. Pepper   L. Mustard powder   C. Sea salt  H. Cooking wine  M. Onion flakes   D. Vinegar  E. Prepared horseradish N. Salsa   E. Sage   J. Worcestershire sauce  O. Chutney

## 2018-07-18 ENCOUNTER — Telehealth: Payer: Self-pay | Admitting: *Deleted

## 2018-07-18 LAB — CBC
Hematocrit: 40.6 % (ref 34.0–46.6)
Hemoglobin: 13.9 g/dL (ref 11.1–15.9)
MCH: 32.3 pg (ref 26.6–33.0)
MCHC: 34.2 g/dL (ref 31.5–35.7)
MCV: 94 fL (ref 79–97)
Platelets: 179 10*3/uL (ref 150–450)
RBC: 4.3 x10E6/uL (ref 3.77–5.28)
RDW: 12.4 % (ref 11.7–15.4)
WBC: 8.1 10*3/uL (ref 3.4–10.8)

## 2018-07-18 LAB — COMPREHENSIVE METABOLIC PANEL
ALT: 22 IU/L (ref 0–32)
AST: 22 IU/L (ref 0–40)
Albumin/Globulin Ratio: 2.4 — ABNORMAL HIGH (ref 1.2–2.2)
Albumin: 4.3 g/dL (ref 3.7–4.7)
Alkaline Phosphatase: 94 IU/L (ref 39–117)
BUN/Creatinine Ratio: 28 (ref 12–28)
BUN: 19 mg/dL (ref 8–27)
Bilirubin Total: 0.5 mg/dL (ref 0.0–1.2)
CO2: 23 mmol/L (ref 20–29)
Calcium: 9.3 mg/dL (ref 8.7–10.3)
Chloride: 105 mmol/L (ref 96–106)
Creatinine, Ser: 0.67 mg/dL (ref 0.57–1.00)
GFR calc Af Amer: 96 mL/min/{1.73_m2} (ref >59)
GFR calc non Af Amer: 83 mL/min/{1.73_m2} (ref >59)
Globulin, Total: 1.8 g/dL (ref 1.5–4.5)
Glucose: 88 mg/dL (ref 65–99)
Potassium: 4.6 mmol/L (ref 3.5–5.2)
Sodium: 142 mmol/L (ref 134–144)
Total Protein: 6.1 g/dL (ref 6.0–8.5)

## 2018-07-18 LAB — PRO B NATRIURETIC PEPTIDE: NT-Pro BNP: 720 pg/mL (ref 0–738)

## 2018-07-18 NOTE — Telephone Encounter (Signed)
14 day [employer] ZT long term live telemetry monitor to be mailed to patients home.  [employer] will call patient with out of pocket quote before sending monitor out.  Patient was asked to please contact our office for an possible alternative if this quote is unacceptable.  Instructions reviewed briefly as they are included in the monitor kit.

## 2018-07-21 ENCOUNTER — Ambulatory Visit (INDEPENDENT_AMBULATORY_CARE_PROVIDER_SITE_OTHER): Payer: Medicare Other

## 2018-07-21 DIAGNOSIS — R55 Syncope and collapse: Secondary | ICD-10-CM | POA: Diagnosis not present

## 2018-07-21 DIAGNOSIS — R42 Dizziness and giddiness: Secondary | ICD-10-CM | POA: Diagnosis not present

## 2018-07-21 DIAGNOSIS — I471 Supraventricular tachycardia: Secondary | ICD-10-CM

## 2018-07-26 ENCOUNTER — Telehealth: Payer: Self-pay

## 2018-07-26 NOTE — Telephone Encounter (Signed)
-----   Message from Thompson Grayer, RN sent at 07/18/2018 10:30 AM EDT ----- Regarding: FW: bmp See messages below.  Can you order BMP when pt sees Dr.Taylor on 7/28 and then cancel the one for 8/24.  Thanks, Pat ----- Message ----- From: Osvaldo Shipper, [place] Sent: 07/18/2018  10:18 AM EDT To: Thompson Grayer, RN Subject: RE: bmp                                        Yes that should be fine just needs to be within 6 weeks ----- Message ----- From: Thompson Grayer, RN Sent: 07/18/2018  10:02 AM EDT To: Osvaldo Shipper, [place] Subject: bmp                                            Pt is scheduled for CT on 9/1.  BMP planned for 8/24.  She is seeing Dr. Lovena Le on 7/28. If BMP done on 7/28 could this be used for CT on 9/1? Thanks, Fraser Din

## 2018-07-26 NOTE — Telephone Encounter (Signed)
Pt needs BMP after Dr. Lovena Le consult.  Added to appt notes.

## 2018-08-13 ENCOUNTER — Other Ambulatory Visit: Payer: Self-pay | Admitting: Physician Assistant

## 2018-08-13 NOTE — Telephone Encounter (Signed)
Asheley, she was only supposed to take lasix for 3 days. Can you see if she's taking it daily? If she needs it daily for swelling we can refill it. I see Fraser Din ordered a bmet when she sees Dr. Lovena Le on 7/28

## 2018-08-13 NOTE — Telephone Encounter (Signed)
Called and spoke to patient. She states that she only took the lasix for 3 days and then stopped. She states that she still has some swelling in her lower extremities. She denies SOB or weight gain. States that she has been avoiding salt in her diet. She states that she has been wearing compression stockings and has been elevated her legs. Made patient aware that I would forward to Ermalinda Barrios, PA for review.

## 2018-08-14 NOTE — Telephone Encounter (Signed)
Can take lasix as needed for swelling edema or shortness of breath

## 2018-08-15 ENCOUNTER — Telehealth: Payer: Self-pay

## 2018-08-15 MED ORDER — FUROSEMIDE 20 MG PO TABS: 20.0000 mg | ORAL_TABLET | Freq: Every day | ORAL | 3 refills | Status: DC

## 2018-08-15 NOTE — Telephone Encounter (Signed)
Call received from Selena [name].  Selena [name] thought our office had called her.  No documentation to indicate our office had contacted Selena [name].  Regardless, Selena [name] states her blood pressure, weight and leg swelling has been increasing.  Selena [name] took lasix 20 mg x 3 days end of June.  Spoke with ML.  Advised Selena [name] to take Lasix 20 mg daily until her f/u appt with Dr. Lovena Le on July 28.  Selena [name] indicates understanding.   Selena [name] states she has lasix, does not need a refill.

## 2018-08-16 NOTE — Telephone Encounter (Signed)
Also see phone call from 7/22.  I spoke with pt and let her know refill has been sent to her pharmacy.  She will take daily until she sees Dr Lovena Le on July 28

## 2018-08-20 ENCOUNTER — Telehealth: Payer: Self-pay

## 2018-08-20 NOTE — Telephone Encounter (Signed)
Spoke with pt regarding covid-19 screening prior to appt. Pt stated she has not been in contact with anyone who may have covid-19 and has no symptoms. 

## 2018-08-21 ENCOUNTER — Encounter: Payer: Self-pay | Admitting: Internal Medicine

## 2018-08-21 ENCOUNTER — Ambulatory Visit (INDEPENDENT_AMBULATORY_CARE_PROVIDER_SITE_OTHER): Payer: Medicare Other | Admitting: Internal Medicine

## 2018-08-21 ENCOUNTER — Other Ambulatory Visit: Payer: Self-pay

## 2018-08-21 VITALS — BP 138/66 | HR 57 | Ht 67.0 in | Wt 204.0 lb

## 2018-08-21 DIAGNOSIS — R55 Syncope and collapse: Secondary | ICD-10-CM | POA: Diagnosis not present

## 2018-08-21 DIAGNOSIS — I712 Thoracic aortic aneurysm, without rupture, unspecified: Secondary | ICD-10-CM

## 2018-08-21 HISTORY — DX: Syncope and collapse: R55

## 2018-08-21 NOTE — Progress Notes (Signed)
HPI Selena [name] is referred today by Amie Portland for evaluation of syncope. She is a pleasant 83 yo woman with a h/o aortic aneurysm, who experienced syncope of unclear etiology. Remotely she has had a h/o SVT. She wore a cardiac monitor and had a single episode of SVT but no syncope. She has some bradycardia noted but when she wore the monitor she was again symptomatic. She denies chest pain or sob.  Allergies  Allergen Reactions  . Codeine Phosphate Other (See Comments)    Hallucinations  . Adhesive [Tape]     Electrodes for EKG- skin blistering, erythema  . Codeine Other (See Comments)    Hallucinations      Current Outpatient Medications  Medication Sig Dispense Refill  . amLODipine (NORVASC) 5 MG tablet Take 1 tablet (5 mg total) by mouth daily. 90 tablet 1  . aspirin EC 81 MG tablet Take 81 mg by mouth daily.    . famotidine (PEPCID) 20 MG tablet One at bedtime (Patient taking differently: Take 20 mg by mouth at bedtime. ) 30 tablet 11  . furosemide (LASIX) 20 MG tablet Take 1 tablet (20 mg total) by mouth daily. 90 tablet 3  . ipratropium (ATROVENT) 0.03 % nasal spray Place 2 sprays into both nostrils 3 (three) times daily as needed for rhinitis. 30 mL 2  . meclizine (ANTIVERT) 25 MG tablet Take 1 tablet (25 mg total) by mouth every 6 (six) hours as needed for dizziness. 100 tablet 2  . meloxicam (MOBIC) 7.5 MG tablet Take 1 tablet (7.5 mg total) by mouth daily. 90 tablet 1  . metoprolol tartrate (LOPRESSOR) 25 MG tablet Take 1 tablet (25 mg total) by mouth 3 (three) times daily. 270 tablet 3  . ondansetron (ZOFRAN) 4 MG tablet Take 1 tablet (4 mg total) by mouth every 8 (eight) hours as needed for nausea or vomiting. 20 tablet 0  . pantoprazole (PROTONIX) 40 MG tablet TAKE 1 TABLET(40 MG) BY MOUTH DAILY 30 TO 60 MINUTES BEFORE FIRST MEAL OF THE DAY (Patient taking differently: Take 40 mg by mouth. 30 TO 60 MINUTES BEFORE FIRST MEAL OF THE DAY) 30 tablet 3   No current  facility-administered medications for this visit.      Past Medical History:  Diagnosis Date  . Anemia    PMH  . Atrial fibrillation ([place]) 01/01/2007  . BACK PAIN, UPPER 07/09/2008  . Breast cancer, stage 1 ([place])   . Bruises easily   . Cataract    history  . Complication of anesthesia   . Diverticulitis   . DIVERTICULOSIS, COLON 12/26/2006  . Essential hypertension 10/27/2017  . Follicular non-Hodgkin's lymphoma ([place])   . Goiter    multi-nodular  . Hx of colonoscopy 2009  . LIVER FUNCTION TESTS, ABNORMAL, HX OF 12/26/2007  . Memory loss 05/16/2007  . OSTEOARTHRITIS 12/26/2006   takes Diclofenac daily but has stopped for surgery  . PONV (postoperative nausea and vomiting)   . Skin cancer   . VERTIGO 09/07/2009  . Wears dentures   . Wears glasses   . Wears hearing aid    bilateral    ROS:   All systems reviewed and negative except as noted in the HPI.   Past Surgical History:  Procedure Laterality Date  . ABDOMINAL HYSTERECTOMY    . ABLATION OF DYSRHYTHMIC FOCUS  2009  . APPENDECTOMY    . AUGMENTATION MAMMAPLASTY Right 2013  . BREAST BIOPSY  2013  . BREAST RECONSTRUCTION  04/14/2011   Procedure: BREAST RECONSTRUCTION;  Surgeon: Crissie Reese, MD;  Location: [facility];  Service: Plastics;  Laterality: Right;  Placement of Right Breast Tissue Expander with  use of Flex HD for Breast Reconstruction  . BREAST SURGERY     right sm, snbx  . CATARACT EXTRACTION W/ INTRAOCULAR LENS  IMPLANT, BILATERAL  2010   bilateral  . COLONOSCOPY    . COLONOSCOPY    . KNEE SURGERY  2004/2010   arthroscopic/ bilateral knee replacements  . MASTECTOMY Right 2013  . PORT-A-CATH REMOVAL N/A 12/07/2016   Procedure: REMOVAL PORT-A-CATH;  Surgeon: Rolm Bookbinder, MD;  Location: [facility];  Service: General;  Laterality: N/A;  . PORTACATH PLACEMENT Right 08/11/2014   Procedure: INSERTION PORT-A-CATH WITH ULTRASOUND;  Surgeon: Rolm Bookbinder, MD;  Location: [facility];  Service: General;  Laterality:  Right;  . [place]   right  . tmi  1998  . TONSILLECTOMY       Family History  Problem Relation Age of Onset  . Colon cancer Mother   . Cancer Mother        colon  . Colon cancer Maternal Aunt   . Cancer Maternal Uncle   . Prostate cancer Maternal Uncle   . Prostate cancer Maternal Uncle   . Other Maternal Uncle   . Cancer Sister        kidney  . Cancer Brother        lymph node  . Anesthesia problems Neg Hx   . Hypotension Neg Hx   . Malignant hyperthermia Neg Hx   . Pseudochol deficiency Neg Hx   . Breast cancer Neg Hx      Social History   Socioeconomic History  . Marital status: Widowed    Spouse name: Not on file  . Number of children: Not on file  . Years of education: Not on file  . Highest education level: Not on file  Occupational History  . Not on file  Social Needs  . Financial resource strain: Not on file  . Food insecurity    Worry: Not on file    Inability: Not on file  . Transportation needs    Medical: Not on file    Non-medical: Not on file  Tobacco Use  . Smoking status: Former Smoker    Packs/day: 0.10    Years: 20.00    Pack years: 2.00    Types: Cigarettes    Quit date: 01/24/1978    Years since quitting: 40.6  . Smokeless tobacco: Never Used  Substance and Sexual Activity  . Alcohol use: No  . Drug use: No  . Sexual activity: Yes    Birth control/protection: Surgical  Lifestyle  . Physical activity    Days per week: Not on file    Minutes per session: Not on file  . Stress: Not on file  Relationships  . Social Herbalist on phone: Not on file    Gets together: Not on file    Attends religious service: Not on file    Active member of club or organization: Not on file    Attends meetings of clubs or organizations: Not on file    Relationship status: Not on file  . Intimate partner violence    Fear of current or ex partner: Not on file    Emotionally abused: Not on file    Physically abused: Not  on file    Forced sexual activity: Not on  file  Other Topics Concern  . Not on file  Social History Narrative  . Not on file     BP 138/66   Pulse (!) 57   Ht 5\' 7"  (1.702 m)   Wt 204 lb (92.5 kg)   SpO2 98%   BMI 31.95 kg/m   Physical Exam:  Well appearing 83 yo woman, NAD HEENT: Unremarkable Neck:  No JVD, no thyromegally Lymphatics:  No adenopathy Back:  No CVA tenderness Lungs:  Clear with no wheezes HEART:  Regular rate rhythm, no murmurs, no rubs, no clicks Abd:  soft, positive bowel sounds, no organomegally, no rebound, no guarding Ext:  2 plus pulses, no edema, no cyanosis, no clubbing Skin:  No rashes no nodules Neuro:  CN II through XII intact, motor grossly intact  EKG - sinus bradycardia with first degree AV block  Assess/Plan: 1. Syncope - the etiology is unclear. I discussed the possibilities and recommended insertion of an ILR.  2. Thoracic aneurysm - her blood pressure is reasonably well controlled. 3. HTN - her blood pressure is ok. We will follow.  Mikle Bosworth.D.

## 2018-08-21 NOTE — Patient Instructions (Addendum)
Medication Instructions:  Your physician recommends that you continue on your current medications as directed. Please refer to the Current Medication list given to you today.  Labwork: You will get lab work today:  BMP  Testing/Procedures: None ordered.  Follow-Up:  We will see you September 20, 2018 at 8:15 am for loop implant   Any Other Special Instructions Will Be Listed Below (If Applicable).  If you need a refill on your cardiac medications before your next appointment, please call your pharmacy.     Implantable Loop Recorder Placement  An implantable loop recorder is a small electronic device that is placed under the skin of your chest. It is about the size of an AA ("double A") battery. The device records the electrical activity of your heart over a long period of time. Your health care provider can download these recordings to monitor your heart. You may need an implantable loop recorder if you have periods of abnormal heart activity (arrhythmias) or unexplained fainting (syncope). The recorder can be left in place for 1 year or longer. Tell a health care provider about:  Any allergies you have.  All medicines you are taking, including vitamins, herbs, eye drops, creams, and over-the-counter medicines.  Any problems you or family members have had with anesthetic medicines.  Any blood disorders you have.  Any surgeries you have had.  Any medical conditions you have.  Whether you are pregnant or may be pregnant. What are the risks? Generally, this is a safe procedure. However, problems may occur, including:  Infection.  Bleeding.  Allergic reactions to anesthetic medicines.  Damage to nerves or blood vessels.  Failure of the device to work. This could require another surgery to replace it. What happens before the procedure?   You may have a physical exam, blood tests, and imaging tests of your heart, such as a chest X-ray.  Follow instructions from your  health care provider about eating or drinking restrictions.  Ask your health care provider about: ? Changing or stopping your regular medicines. This is especially important if you are taking diabetes medicines or blood thinners. ? Taking medicines such as aspirin and ibuprofen. These medicines can thin your blood. Do not take these medicines unless your health care provider tells you to take them. ? Taking over-the-counter medicines, vitamins, herbs, and supplements.  Ask your health care provider how your surgical site will be marked or identified.  Ask your health care provider what steps will be taken to help prevent infection. These may include: ? Removing hair at the surgery site. ? Washing skin with a germ-killing soap.  Plan to have someone take you home from the hospital or clinic.  Plan to have a responsible adult care for you for at least 24 hours after you leave the hospital or clinic. This is important.  Do not use any products that contain nicotine or tobacco, such as cigarettes and e-cigarettes. If you need help quitting, ask your health care provider. What happens during the procedure?  An IV will be inserted into one of your veins.  You may be given one or more of the following: ? A medicine to help you relax (sedative). ? A medicine to numb the area (local anesthetic).  A small incision will be made on the left side of your upper chest.  A pocket will be created under your skin.  The device will be placed in the pocket.  The incision will be closed with stitches (sutures) or adhesive strips.  A bandage (dressing) will be placed over the incision. The procedure may vary among health care providers and hospitals. What happens after the procedure?  Your blood pressure, heart rate, breathing rate, and blood oxygen level will be monitored until you leave the hospital or clinic.  You may be able to go home on the day of your surgery. Before you go home: ? Your  health care provider will program your recorder. ? You will learn how to trigger your device with a handheld activator. ? You will learn how to send recordings to your health care provider. ? You will get an ID card for your device, and you will be told when to use it.  Do not drive for 24 hours if you were given a sedative during your procedure. Summary  An implantable loop recorder is a small electronic device that is placed under the skin of your chest to monitor your heart over a long period of time.  The recorder can be left in place for 1 year or longer.  Plan to have someone take you home from the hospital or clinic. This information is not intended to replace advice given to you by your health care provider. Make sure you discuss any questions you have with your health care provider. Document Released: 12/22/2014 Document Revised: 03/16/2017 Document Reviewed: 02/25/2017 Elsevier Patient Education  2020 [employer].

## 2018-08-22 LAB — BASIC METABOLIC PANEL
BUN/Creatinine Ratio: 29 — ABNORMAL HIGH (ref 12–28)
BUN: 22 mg/dL (ref 8–27)
CO2: 23 mmol/L (ref 20–29)
Calcium: 9.4 mg/dL (ref 8.7–10.3)
Chloride: 104 mmol/L (ref 96–106)
Creatinine, Ser: 0.77 mg/dL (ref 0.57–1.00)
GFR calc Af Amer: 84 mL/min/{1.73_m2} (ref >59)
GFR calc non Af Amer: 73 mL/min/{1.73_m2} (ref >59)
Glucose: 94 mg/dL (ref 65–99)
Potassium: 4.4 mmol/L (ref 3.5–5.2)
Sodium: 144 mmol/L (ref 134–144)

## 2018-09-17 ENCOUNTER — Other Ambulatory Visit: Payer: Medicare Other

## 2018-09-19 ENCOUNTER — Telehealth: Payer: Self-pay | Admitting: Internal Medicine

## 2018-09-19 NOTE — Telephone Encounter (Signed)
  Patient is having a loop recorder placed tomorrow. She is calling because she had to have some skin cancer burned off today and it was on her left side. She is asking if that will interfere with her procedure tomorrow.

## 2018-09-19 NOTE — Telephone Encounter (Signed)
Returned call to Pt.  Per Pt she had a cancer cell burned off today.  Pt describes spot as very small and more so in front of her left shoulder blade.  Discussed with [employer] rep.  Advised that Pt would be ok for loop implant.  May have to put in a different place but procedure ok to go forward.  Advised Pt she was ok to proceed with loop implant as scheduled.  Pt indicates understanding.

## 2018-09-20 ENCOUNTER — Ambulatory Visit (INDEPENDENT_AMBULATORY_CARE_PROVIDER_SITE_OTHER): Payer: Medicare Other | Admitting: Internal Medicine

## 2018-09-20 ENCOUNTER — Other Ambulatory Visit: Payer: Self-pay

## 2018-09-20 ENCOUNTER — Encounter: Payer: Self-pay | Admitting: Internal Medicine

## 2018-09-20 VITALS — BP 122/70 | HR 66 | Ht 67.0 in | Wt 206.2 lb

## 2018-09-20 DIAGNOSIS — R55 Syncope and collapse: Secondary | ICD-10-CM

## 2018-09-20 NOTE — Patient Instructions (Addendum)
Medication Instructions:  Your physician recommends that you continue on your current medications as directed. Please refer to the Current Medication list given to you today.  Labwork: None ordered.  Testing/Procedures: None ordered.  Follow-Up: Your physician wants you to follow-up in: 7-10 days with the device clinic for a wound check--  Your physician wants you to follow-up in: 3 months with Dr. Lovena Le--- December 18, 2018 at 2:15 pm.   Any Other Special Instructions Will Be Listed Below (If Applicable).  If you need a refill on your cardiac medications before your next appointment, please call your pharmacy.     Implantable Loop Recorder Placement, Care After This sheet gives you information about how to care for yourself after your procedure. Your health care provider may also give you more specific instructions. If you have problems or questions, contact your health care provider. What can I expect after the procedure? After the procedure, it is common to have:  Soreness or discomfort near the incision.  Some swelling or bruising near the incision. Follow these instructions at home: Incision care   Follow instructions from your health care provider about how to take care of your incision. Make sure you: ? Wash your hands with soap and water before you change your bandage (dressing). If soap and water are not available, use hand sanitizer. ? Change your dressing as told by your health care provider. ? Keep your dressing dry. ? Leave stitches (sutures), skin glue, or adhesive strips in place. These skin closures may need to stay in place for 2 weeks or longer. If adhesive strip edges start to loosen and curl up, you may trim the loose edges. Do not remove adhesive strips completely unless your health care provider tells you to do that.  Check your incision area every day for signs of infection. Check for: ? Redness, swelling, or pain. ? Fluid or blood. ? Warmth. ? Pus  or a bad smell.  Do not take baths, swim, or use a hot tub until your health care provider approves. Ask your health care provider if you can take showers. Activity   Return to your normal activities as told by your health care provider. Ask your health care provider what activities are safe for you. General instructions  Follow instructions from your health care provider about how to manage your implantable loop recorder and transmit the information. Learn how to activate a recording if this is necessary for your type of device.  Do not go through a metal detection gate, and do not let someone hold a metal detector over your chest. Show your ID card.  Do not have an MRI unless you check with your health care provider first.  Take over-the-counter and prescription medicines only as told by your health care provider.  Keep all follow-up visits as told by your health care provider. This is important. Contact a health care provider if:  You have redness, swelling, or pain around your incision.  You have a fever.  You have pain that is not relieved by your pain medicine.  You have triggered your device because of fainting (syncope) or because of a heartbeat that feels like it is racing, slow, fluttering, or skipping (palpitations). Get help right away if you have:  Chest pain.  Difficulty breathing. Summary  After the procedure, it is common to have soreness or discomfort near the incision.  Change your dressing as told by your health care provider.  Follow instructions from your health care provider about  how to manage your implantable loop recorder and transmit the information.  Keep all follow-up visits as told by your health care provider. This is important. This information is not intended to replace advice given to you by your health care provider. Make sure you discuss any questions you have with your health care provider. Document Released: 12/22/2014 Document Revised:  02/25/2017 Document Reviewed: 02/25/2017 Elsevier Patient Education  2020 [employer].

## 2018-09-20 NOTE — Progress Notes (Signed)
HPI Selena [name] presents today to undergo ILR insertion for unexplained syncope. Allergies  Allergen Reactions  . Codeine Phosphate Other (See Comments)    Hallucinations  . Adhesive [Tape]     Electrodes for EKG- skin blistering, erythema  . Codeine Other (See Comments)    Hallucinations      Current Outpatient Medications  Medication Sig Dispense Refill  . amLODipine (NORVASC) 5 MG tablet Take 1 tablet (5 mg total) by mouth daily. 90 tablet 1  . aspirin EC 81 MG tablet Take 81 mg by mouth daily.    . famotidine (PEPCID) 20 MG tablet One at bedtime 30 tablet 11  . furosemide (LASIX) 20 MG tablet Take 1 tablet (20 mg total) by mouth daily. 90 tablet 3  . ipratropium (ATROVENT) 0.03 % nasal spray Place 2 sprays into both nostrils 3 (three) times daily as needed for rhinitis. 30 mL 2  . meclizine (ANTIVERT) 25 MG tablet Take 1 tablet (25 mg total) by mouth every 6 (six) hours as needed for dizziness. 100 tablet 2  . meloxicam (MOBIC) 7.5 MG tablet Take 1 tablet (7.5 mg total) by mouth daily. 90 tablet 1  . metoprolol tartrate (LOPRESSOR) 25 MG tablet Take 1 tablet (25 mg total) by mouth 3 (three) times daily. 270 tablet 3  . ondansetron (ZOFRAN) 4 MG tablet Take 1 tablet (4 mg total) by mouth every 8 (eight) hours as needed for nausea or vomiting. 20 tablet 0  . pantoprazole (PROTONIX) 40 MG tablet TAKE 1 TABLET(40 MG) BY MOUTH DAILY 30 TO 60 MINUTES BEFORE FIRST MEAL OF THE DAY 30 tablet 3   No current facility-administered medications for this visit.      Past Medical History:  Diagnosis Date  . Anemia    PMH  . Atrial fibrillation ([place]) 01/01/2007  . BACK PAIN, UPPER 07/09/2008  . Breast cancer, stage 1 ([place])   . Bruises easily   . Cataract    history  . Complication of anesthesia   . Diverticulitis   . DIVERTICULOSIS, COLON 12/26/2006  . Essential hypertension 10/27/2017  . Follicular non-Hodgkin's lymphoma ([place])   . Goiter    multi-nodular  . Hx of  colonoscopy 2009  . LIVER FUNCTION TESTS, ABNORMAL, HX OF 12/26/2007  . Memory loss 05/16/2007  . OSTEOARTHRITIS 12/26/2006   takes Diclofenac daily but has stopped for surgery  . PONV (postoperative nausea and vomiting)   . Skin cancer   . VERTIGO 09/07/2009  . Wears dentures   . Wears glasses   . Wears hearing aid    bilateral    ROS:   All systems reviewed and negative except as noted in the HPI.   Past Surgical History:  Procedure Laterality Date  . ABDOMINAL HYSTERECTOMY    . ABLATION OF DYSRHYTHMIC FOCUS  2009  . APPENDECTOMY    . AUGMENTATION MAMMAPLASTY Right 2013  . BREAST BIOPSY  2013  . BREAST RECONSTRUCTION  04/14/2011   Procedure: BREAST RECONSTRUCTION;  Surgeon: Crissie Reese, MD;  Location: [facility];  Service: Plastics;  Laterality: Right;  Placement of Right Breast Tissue Expander with  use of Flex HD for Breast Reconstruction  . BREAST SURGERY     right sm, snbx  . CATARACT EXTRACTION W/ INTRAOCULAR LENS  IMPLANT, BILATERAL  2010   bilateral  . COLONOSCOPY    . COLONOSCOPY    . KNEE SURGERY  2004/2010   arthroscopic/ bilateral knee replacements  . MASTECTOMY  Right 2013  . PORT-A-CATH REMOVAL N/A 12/07/2016   Procedure: REMOVAL PORT-A-CATH;  Surgeon: Rolm Bookbinder, MD;  Location: [facility];  Service: General;  Laterality: N/A;  . PORTACATH PLACEMENT Right 08/11/2014   Procedure: INSERTION PORT-A-CATH WITH ULTRASOUND;  Surgeon: Rolm Bookbinder, MD;  Location: [facility];  Service: General;  Laterality: Right;  . [place]   right  . tmi  1998  . TONSILLECTOMY       Family History  Problem Relation Age of Onset  . Colon cancer Mother   . Cancer Mother        colon  . Colon cancer Maternal Aunt   . Cancer Maternal Uncle   . Prostate cancer Maternal Uncle   . Prostate cancer Maternal Uncle   . Other Maternal Uncle   . Cancer Sister        kidney  . Cancer Brother        lymph node  . Anesthesia problems Neg Hx   . Hypotension Neg Hx   .  Malignant hyperthermia Neg Hx   . Pseudochol deficiency Neg Hx   . Breast cancer Neg Hx      Social History   Socioeconomic History  . Marital status: Widowed    Spouse name: Not on file  . Number of children: Not on file  . Years of education: Not on file  . Highest education level: Not on file  Occupational History  . Not on file  Social Needs  . Financial resource strain: Not on file  . Food insecurity    Worry: Not on file    Inability: Not on file  . Transportation needs    Medical: Not on file    Non-medical: Not on file  Tobacco Use  . Smoking status: Former Smoker    Packs/day: 0.10    Years: 20.00    Pack years: 2.00    Types: Cigarettes    Quit date: 01/24/1978    Years since quitting: 40.6  . Smokeless tobacco: Never Used  Substance and Sexual Activity  . Alcohol use: No  . Drug use: No  . Sexual activity: Yes    Birth control/protection: Surgical  Lifestyle  . Physical activity    Days per week: Not on file    Minutes per session: Not on file  . Stress: Not on file  Relationships  . Social Herbalist on phone: Not on file    Gets together: Not on file    Attends religious service: Not on file    Active member of club or organization: Not on file    Attends meetings of clubs or organizations: Not on file    Relationship status: Not on file  . Intimate partner violence    Fear of current or ex partner: Not on file    Emotionally abused: Not on file    Physically abused: Not on file    Forced sexual activity: Not on file  Other Topics Concern  . Not on file  Social History Narrative  . Not on file     BP 122/70   Pulse 66   Ht 5\' 7"  (1.702 m)   Wt 206 lb 3.2 oz (93.5 kg)   SpO2 97%   BMI 32.30 kg/m   Physical Exam:  Well appearing NAD HEENT: Unremarkable Neck:  No JVD, no thyromegally Ext:  2 plus pulses, no edema, no cyanosis, no clubbing Skin:  No rashes no nodules Neuro:  CN II through XII intact, motor grossly intact    Assess/Plan:  1. Unexplained syncope - we will place ILR. See below.  EP Procedure Note  Procedure Performed: insertion of an ILR  Preoperative diagnosis: unexplained syncope  Postoperative diagnosis: same as preoperative diagnosis  Description of the procedure: after informed consent was obtained, the patient was prepped and draped in a sterile manner. After the usual preparation and draping, 10 cc of lidocaine was infiltrated into the left pectoral region. A one cm stab incision was carried out. The Medtronic ILR, # [id number], was inserted. The R waves measured 0.4 mV. Benzoin and steristrips were painted on the skin. A bandage was placed and the patient recovered in the usual manner.  Complications: none immediately  Conclusion: successful insertion of a medtronic ILR in a patient with unexplained syncope without immediate complications.   Mikle Bosworth.D.

## 2018-09-21 ENCOUNTER — Telehealth: Payer: Self-pay | Admitting: Internal Medicine

## 2018-09-21 NOTE — Telephone Encounter (Signed)
Pt had her Loop recorder inserted yesterday and she noticed some blood on the gauze this morning.. it is not soaked through and no pain, no edema... she was just making sure it was okay... she said it happened when she was sleeping.. I advised her that movement for the first several hours could cause some bleeding but it should be stopped by now which she says it is.[name] and she will hold pressure if she notices any active bleeding and call our office to report. Otherwise she says she will continue to monitor and avoid any heavy lifting the next few days.

## 2018-09-21 NOTE — Telephone Encounter (Signed)
° °  Patient calling to report blood on gauze, no pain, s/p loop recorder 8/27

## 2018-09-24 ENCOUNTER — Other Ambulatory Visit: Payer: Medicare Other

## 2018-09-25 ENCOUNTER — Ambulatory Visit (INDEPENDENT_AMBULATORY_CARE_PROVIDER_SITE_OTHER)
Admission: RE | Admit: 2018-09-25 | Discharge: 2018-09-25 | Disposition: A | Discharge status: Home / Self Care | Payer: Medicare Other | Source: Ambulatory Visit | Attending: Cardiovascular Disease | Admitting: Cardiovascular Disease

## 2018-09-25 ENCOUNTER — Other Ambulatory Visit: Payer: Self-pay

## 2018-09-25 DIAGNOSIS — I712 Thoracic aortic aneurysm, without rupture, unspecified: Secondary | ICD-10-CM

## 2018-09-25 MED ORDER — IOHEXOL 350 MG/ML SOLN
100.0000 mL | Freq: Once | INTRAVENOUS | Status: AC | PRN
  Administered 2018-09-25: 100 mL via INTRAVENOUS

## 2018-09-26 ENCOUNTER — Telehealth: Payer: Self-pay | Admitting: *Deleted

## 2018-09-26 DIAGNOSIS — E079 Disorder of thyroid, unspecified: Secondary | ICD-10-CM

## 2018-09-26 NOTE — Telephone Encounter (Signed)
I spoke with pt and reviewed results/recommendations with her. She would like to proceed with thyroid ultrasound.  Order placed to be done at [facility]--[address].

## 2018-09-26 NOTE — Telephone Encounter (Signed)
-----   Message from Burnell Blanks, MD sent at 09/26/2018  8:18 AM EDT ----- Her thoracic aortic aneurysm is unchanged is size, only mildly dilated. There was a finding of a thyroid mass. Can we let her know about the scan and arrange a dedicated thyroid u/s? Thanks, chris

## 2018-10-01 NOTE — Progress Notes (Addendum)
Cardiology Office Note Date:  10/03/2018  Patient ID:  Selena [name], Selena [name] 1935/08/29, MRN [id number] PCP:  Caren Macadam, MD  Cardiologist:  Dr. Angelena Form Electrophysiologist: Dr. Lovena Le     Chief Complaint: wound check  History of Present Illness: Selena [name] is a 83 y.o. female with history of HTN, Non Hodgkin's Lymphoma, breast cancer (R mastectomy and chemo), AFlutter (ablated 2008) ? AFib, thoracic aortic aneurysm (4.2cm), vertigo and of late recurrent syncope.  She was seen by Gerrianne Scale, PA-C in June for recurrent syncope underwent monitoring noting brief SVT, asymptomatic and without etiology for her syncope, referred to Dr. Lovena Le, underwent implant of loop recorder 09/20/2018.  She comes in today for wound check and f/u.  She is feeling well today, mentions at night when relaxed she is feeling palpitations, a flutter sensation.  Does not notice it much in the daytime.  If ever.  Is not aware of any fluttering this AM.  No CP, no near syncope or syncope. She does have DOE, no rest SOB, no symptoms of orthopnea or PND.  She dates the DOE back about 6 months, likely longer, not escalating. She has swelling in her feet/ankles that seems to come and go as well.  Denies any difficulties or concerns with her implant wound.  Device information: MDT ILR implanted 09/20/2018 for recurrent syncope   Past Medical History:  Diagnosis Date  . Anemia    PMH  . Atrial fibrillation ([place]) 01/01/2007  . BACK PAIN, UPPER 07/09/2008  . Breast cancer, stage 1 ([place])   . Bruises easily   . Cataract    history  . Complication of anesthesia   . Diverticulitis   . DIVERTICULOSIS, COLON 12/26/2006  . Essential hypertension 10/27/2017  . Follicular non-Hodgkin's lymphoma ([place])   . Goiter    multi-nodular  . Hx of colonoscopy 2009  . LIVER FUNCTION TESTS, ABNORMAL, HX OF 12/26/2007  . Memory loss 05/16/2007  . OSTEOARTHRITIS 12/26/2006   takes Diclofenac daily but has stopped for surgery   . PONV (postoperative nausea and vomiting)   . Skin cancer   . VERTIGO 09/07/2009  . Wears dentures   . Wears glasses   . Wears hearing aid    bilateral    Past Surgical History:  Procedure Laterality Date  . ABDOMINAL HYSTERECTOMY    . ABLATION OF DYSRHYTHMIC FOCUS  2009  . APPENDECTOMY    . AUGMENTATION MAMMAPLASTY Right 2013  . BREAST BIOPSY  2013  . BREAST RECONSTRUCTION  04/14/2011   Procedure: BREAST RECONSTRUCTION;  Surgeon: Crissie Reese, MD;  Location: [facility];  Service: Plastics;  Laterality: Right;  Placement of Right Breast Tissue Expander with  use of Flex HD for Breast Reconstruction  . BREAST SURGERY     right sm, snbx  . CATARACT EXTRACTION W/ INTRAOCULAR LENS  IMPLANT, BILATERAL  2010   bilateral  . COLONOSCOPY    . COLONOSCOPY    . KNEE SURGERY  2004/2010   arthroscopic/ bilateral knee replacements  . MASTECTOMY Right 2013  . PORT-A-CATH REMOVAL N/A 12/07/2016   Procedure: REMOVAL PORT-A-CATH;  Surgeon: Rolm Bookbinder, MD;  Location: [facility];  Service: General;  Laterality: N/A;  . PORTACATH PLACEMENT Right 08/11/2014   Procedure: INSERTION PORT-A-CATH WITH ULTRASOUND;  Surgeon: Rolm Bookbinder, MD;  Location: [facility];  Service: General;  Laterality: Right;  . [place]   right  . tmi  1998  . TONSILLECTOMY      Current Outpatient  Medications  Medication Sig Dispense Refill  . amLODipine (NORVASC) 5 MG tablet Take 1 tablet (5 mg total) by mouth daily. 90 tablet 1  . apixaban (ELIQUIS) 5 MG TABS tablet Take 1 tablet (5 mg total) by mouth 2 (two) times daily. 60 tablet 6  . famotidine (PEPCID) 20 MG tablet One at bedtime 30 tablet 11  . furosemide (LASIX) 20 MG tablet Take 2 tablets (40 mg total) by mouth daily. 60 tablet 6  . ipratropium (ATROVENT) 0.03 % nasal spray Place 2 sprays into both nostrils 3 (three) times daily as needed for rhinitis. 30 mL 2  . meclizine (ANTIVERT) 25 MG tablet Take 1 tablet (25 mg total) by mouth every 6 (six)  hours as needed for dizziness. 100 tablet 2  . meloxicam (MOBIC) 7.5 MG tablet Take 1 tablet (7.5 mg total) by mouth daily. 90 tablet 1  . metoprolol tartrate (LOPRESSOR) 25 MG tablet Take 1 tablet (25 mg total) by mouth 3 (three) times daily. 270 tablet 3  . ondansetron (ZOFRAN) 4 MG tablet Take 1 tablet (4 mg total) by mouth every 8 (eight) hours as needed for nausea or vomiting. 20 tablet 0  . pantoprazole (PROTONIX) 40 MG tablet TAKE 1 TABLET(40 MG) BY MOUTH DAILY 30 TO 60 MINUTES BEFORE FIRST MEAL OF THE DAY 30 tablet 3   No current facility-administered medications for this visit.     Allergies:   Codeine phosphate, Adhesive [tape], and Codeine   Social History:  The patient  reports that she quit smoking about 40 years ago. Her smoking use included cigarettes. She has a 2.00 pack-year smoking history. She has never used smokeless tobacco. She reports that she does not drink alcohol or use drugs.   Family History:  The patient's family history includes Cancer in her brother, maternal uncle, mother, and sister; Colon cancer in her maternal aunt and mother; Other in her maternal uncle; Prostate cancer in her maternal uncle and maternal uncle.  ROS:  Please see the history of present illness.    All other systems are reviewed and otherwise negative.   PHYSICAL EXAM:  VS:  BP 134/66   Pulse 69   Ht 5\' 7"  (1.702 m)   Wt 209 lb (94.8 kg)   BMI 32.73 kg/m  BMI: Body mass index is 32.73 kg/m. Well nourished, well developed, in no acute distress  HEENT: normocephalic, atraumatic  Neck: no JVD, carotid bruits or masses Cardiac:  irreg-irreg; no significant murmurs, no rubs, or gallops Lungs:  CTA b/l, no wheezing, rhonchi or rales  Abd: soft, nontender MS: no deformity or atrophy Ext: trace-1+ edema, to just above the ankles Skin: warm and dry, no rash Neuro:  No gross deficits appreciated Psych: euthymic mood, full affect  ILR site: steri strips are removed without difficulty,  wound edges are well healed, no erythema edema, fluctuation or increased heat to the surrounding  tissues.   EKG:  Afib/atypical aflutter, 69bpm ILR interrogation done today and reviewed by myself: Battery is good She is currently in an Afib episode Total of 120 AF episodes, longest 12 hours No brady, tachy, pause or symptom episodes  July 2020-, Long term monitor Patient had a min HR of 42 bpm, max HR of 174 bpm, and avg HR of 59 bpm. Predominant underlying rhythm was Sinus Rhythm. First Degree AV Block was present. 1 run of Ventricular Tachycardia occurred lasting 5 beats with a max rate of 128 bpm (avg 120 bpm). 762 Supraventricular Tachycardia runs occurred,  the run with the fastest interval lasting 12 beats with a max rate of 174 bpm, the longest lasting 2 mins 13 secs with an avg rate of 110 bpm. Episode of Supraventricular Tachycardia may be possible Atrial Tachycardia with variable block. Isolated SVEs were rare (<1.0%), SVE Couplets were rare (<1.0%), and SVE Triplets were rare (<1.0%). Isolated VEs were rare (<1.0%), and no VE Couplets or VE Triplets were present   09/07/17: TTE Study Conclusions - Left ventricle: The cavity size was normal. Wall thickness was   increased in a pattern of mild LVH. Systolic function was normal.   The estimated ejection fraction was in the range of 55% to 60%.   Wall motion was normal; there were no regional wall motion   abnormalities. Features are consistent with a pseudonormal left   ventricular filling pattern, with concomitant abnormal relaxation   and increased filling pressure (grade 2 diastolic dysfunction). - Left atrium: The atrium was mildly dilated. Volume/bsa, ES,   (1-plane Simpson&'s, A2C): 35 ml/m^2.   Recent Labs: 07/17/2018: ALT 22; NT-Pro BNP 720 08/21/2018: BUN 22; Creatinine, Ser 0.77; Potassium 4.4; Sodium 144 10/03/2018: Hemoglobin 14.1; Platelets 194; TSH WILL FOLLOW  No results found for requested labs within last  8760 hours.   CrCl cannot be calculated (Patient's most recent lab result is older than the maximum 21 days allowed.).   Wt Readings from Last 3 Encounters:  10/03/18 209 lb (94.8 kg)  09/20/18 206 lb 3.2 oz (93.5 kg)  08/21/18 204 lb (92.5 kg)     Other studies reviewed: Additional studies/records reviewed today include: summarized above  ASSESSMENT AND PLAN:  1. Syncope 2. ILR    Site looks good, no evidence of infection    Unknown etiology of her syncope, both occurred while standing, I advised her not to drive and made aware of [place] law, no driving for 6 months with unexplained syncopedrving   3. Paroxysmal Afib/atypical aflautter     New as best I can tell.  Her problem list mention it, but she only recalls the episode years ago that felt different (suspect this was the flutter?)     CHA2DS2Vasc is 3, (4 with gender)  We discussed her AFib, it is rate controlled (60's-80's) Minimally symptomatic Discussed her [id number] score, her embolic/stroke risk, and rational for a/c for AFib She states understanding  Will stop her ASA, start Eliquis 5mg  BID No bleeding history, no recent surgeries Get a TSH today and baseline CBC    4. Intermittent edema/DOE, diastolic HF     Unclear if AFib plays a role     She has Grade II DD on her echo     Increased her lasix to 40mg  daily     BMET in a week    Disposition: F/u Dr. McAlhany/team in a few weeks, he is following her CT scan, she is pending thyroid US today  Current medicines are reviewed at length with the patient today.  The patient did not have any concerns regarding medicines.  Venetia Night, PA-C 10/03/2018 3:58 PM     [address] [facility] [place] [address] [phone] (office)  [phone] (fax)

## 2018-10-03 ENCOUNTER — Other Ambulatory Visit: Payer: Self-pay | Admitting: *Deleted

## 2018-10-03 ENCOUNTER — Ambulatory Visit
Admission: RE | Admit: 2018-10-03 | Discharge: 2018-10-03 | Disposition: A | Discharge status: Home / Self Care | Payer: Medicare Other | Source: Ambulatory Visit | Attending: Cardiovascular Disease | Admitting: Cardiovascular Disease

## 2018-10-03 ENCOUNTER — Telehealth: Payer: Self-pay | Admitting: Physician Assistant

## 2018-10-03 ENCOUNTER — Other Ambulatory Visit: Payer: Self-pay

## 2018-10-03 ENCOUNTER — Ambulatory Visit (INDEPENDENT_AMBULATORY_CARE_PROVIDER_SITE_OTHER): Payer: Medicare Other | Admitting: Physician Assistant

## 2018-10-03 VITALS — BP 134/66 | HR 69 | Ht 67.0 in | Wt 209.0 lb

## 2018-10-03 DIAGNOSIS — I480 Paroxysmal atrial fibrillation: Secondary | ICD-10-CM | POA: Diagnosis not present

## 2018-10-03 DIAGNOSIS — Z79899 Other long term (current) drug therapy: Secondary | ICD-10-CM

## 2018-10-03 DIAGNOSIS — Z5189 Encounter for other specified aftercare: Secondary | ICD-10-CM

## 2018-10-03 DIAGNOSIS — Z4509 Encounter for adjustment and management of other cardiac device: Secondary | ICD-10-CM

## 2018-10-03 DIAGNOSIS — I5032 Chronic diastolic (congestive) heart failure: Secondary | ICD-10-CM | POA: Diagnosis not present

## 2018-10-03 DIAGNOSIS — E079 Disorder of thyroid, unspecified: Secondary | ICD-10-CM

## 2018-10-03 LAB — CBC
Hematocrit: 42.4 % (ref 34.0–46.6)
Hemoglobin: 14.1 g/dL (ref 11.1–15.9)
MCH: 31.7 pg (ref 26.6–33.0)
MCHC: 33.3 g/dL (ref 31.5–35.7)
MCV: 95 fL (ref 79–97)
Platelets: 194 10*3/uL (ref 150–450)
RBC: 4.45 x10E6/uL (ref 3.77–5.28)
RDW: 12.4 % (ref 11.7–15.4)
WBC: 8.9 10*3/uL (ref 3.4–10.8)

## 2018-10-03 LAB — TSH: TSH: 1.41 u[IU]/mL (ref 0.450–4.500)

## 2018-10-03 MED ORDER — APIXABAN 5 MG PO TABS: 5.0000 mg | ORAL_TABLET | Freq: Two times a day (BID) | ORAL | 6 refills | Status: DC

## 2018-10-03 MED ORDER — FUROSEMIDE 20 MG PO TABS: 40.0000 mg | ORAL_TABLET | Freq: Every day | ORAL | 6 refills | Status: DC

## 2018-10-03 NOTE — Patient Instructions (Signed)
Medication Instructions:   STOP  TAKING  ASPIRIN   START TAKING  ELIQUIS 5 MG TWICE  DAY   START TAKING LASIX 64 ONCE A DAY   If you need a refill on your cardiac medications before your next appointment, please call your pharmacy.   Lab work:  CBC AND TSH TODAY   BMET IN ONE WEEK   If you have labs (blood work) drawn today and your tests are completely normal, you will receive your results only by: [name] MyChart Message (if you have MyChart) OR . A paper copy in the mail If you have any lab test that is abnormal or we need to change your treatment, we will call you to review the results.  Testing/Procedures: NONE ORDERED  TODAY   Follow-Up: At [facility], you and your health needs are our priority.  As part of our continuing mission to provide you with exceptional heart care, we have created designated Provider Care Teams.  These Care Teams include your primary Cardiologist (physician) and Advanced Practice Providers (APPs -  Physician Assistants and Nurse Practitioners) who all work together to provide you with the care you need, when you need it. You will need a follow up appointment in 2 weeks.  Please call our office 2 months in advance to schedule this appointment.  You may see Lauree Chandler, MD or one of the following Advanced Practice Providers on your designated Care Team:   [place], PA-C Melina Copa, PA-C . Ermalinda Barrios, PA-C  Any Other Special Instructions Will Be Listed Below (If Applicable).

## 2018-10-03 NOTE — Telephone Encounter (Signed)
New message  Pt c/o medication issue:  1. Name of Medication: Eliquis  5mg   Lasix   2. How are you currently taking this medication (dosage and times per day)? N/a  3. Are you having a reaction (difficulty breathing--STAT)? n/a  4. What is your medication issue? Patient states that during the visit she was prescribed these medications but has called the [facility] [place] [facility] [place] at it is not at the pharmacy yet. Please advise.

## 2018-10-04 MED ORDER — FUROSEMIDE 40 MG PO TABS: 40.0000 mg | ORAL_TABLET | Freq: Every day | ORAL | 3 refills | Status: DC

## 2018-10-04 NOTE — Telephone Encounter (Signed)
Called patient to review thyroid US results. She states she had a visit yesterday and her Eliquis was called in but her Lasix was not. Lasix 40 mg called in to pharmacy.  The patient was grateful for assistance.

## 2018-10-10 ENCOUNTER — Other Ambulatory Visit: Payer: Self-pay

## 2018-10-10 ENCOUNTER — Other Ambulatory Visit: Payer: Medicare Other | Admitting: *Deleted

## 2018-10-10 DIAGNOSIS — Z79899 Other long term (current) drug therapy: Secondary | ICD-10-CM

## 2018-10-10 LAB — BASIC METABOLIC PANEL
BUN/Creatinine Ratio: 31 — ABNORMAL HIGH (ref 12–28)
BUN: 23 mg/dL (ref 8–27)
CO2: 25 mmol/L (ref 20–29)
Calcium: 9.4 mg/dL (ref 8.7–10.3)
Chloride: 105 mmol/L (ref 96–106)
Creatinine, Ser: 0.75 mg/dL (ref 0.57–1.00)
GFR calc Af Amer: 87 mL/min/{1.73_m2} (ref >59)
GFR calc non Af Amer: 76 mL/min/{1.73_m2} (ref >59)
Glucose: 106 mg/dL — ABNORMAL HIGH (ref 65–99)
Potassium: 4 mmol/L (ref 3.5–5.2)
Sodium: 145 mmol/L — ABNORMAL HIGH (ref 134–144)

## 2018-10-15 ENCOUNTER — Telehealth: Payer: Self-pay

## 2018-10-15 ENCOUNTER — Other Ambulatory Visit: Payer: Self-pay

## 2018-10-15 ENCOUNTER — Ambulatory Visit
Admission: RE | Admit: 2018-10-15 | Discharge: 2018-10-15 | Disposition: A | Discharge status: Home / Self Care | Payer: Medicare Other | Source: Ambulatory Visit | Attending: Family Medicine | Admitting: Family Medicine

## 2018-10-15 DIAGNOSIS — N63 Unspecified lump in unspecified breast: Secondary | ICD-10-CM

## 2018-10-15 NOTE — Telephone Encounter (Signed)
Pt states she leaves her monitor on the dresser and do not touch it. I told her that is exactly what she supposed to do with her monitor. I told her happy birthday. The pt thanked me for the call.

## 2018-10-16 DIAGNOSIS — I5032 Chronic diastolic (congestive) heart failure: Secondary | ICD-10-CM | POA: Insufficient documentation

## 2018-10-16 DIAGNOSIS — I5033 Acute on chronic diastolic (congestive) heart failure: Secondary | ICD-10-CM

## 2018-10-16 DIAGNOSIS — I712 Thoracic aortic aneurysm, without rupture, unspecified: Secondary | ICD-10-CM

## 2018-10-16 DIAGNOSIS — Z9889 Other specified postprocedural states: Secondary | ICD-10-CM

## 2018-10-16 HISTORY — DX: Acute on chronic diastolic (congestive) heart failure: I50.33

## 2018-10-16 HISTORY — DX: Other specified postprocedural states: Z98.890

## 2018-10-16 HISTORY — DX: Thoracic aortic aneurysm, without rupture, unspecified: I71.20

## 2018-10-16 NOTE — Progress Notes (Signed)
Cardiology Office Note    Date:  10/17/2018   ID:  Selena [name], DOB [birth year], MRN [id number]  PCP:  Caren Macadam, MD  Cardiologist: Lauree Chandler, MD EPS: None  Chief Complaint  Patient presents with  . Follow-up    History of Present Illness:  Selena [name] is a 83 y.o. female with history of thoracic aortic aneurysm 4.2 cm 08/2017 needs repeat CTA 08/2018, history of SVT, PACs, PACs are metoprolol 25 mg twice daily takes extra as needed, hypertension, non-Hodgkin's lymphoma.  2D echo 08/2017 mild LVH normal LVEF 55 to 60% with grade 2 DD, remote history of atrial flutter in 2008 and she had a cardioversion followed by ablation by Dr. Caryl Comes SVT PACs and PVCs on cardiac monitor 02/2018.  I saw the patient in June 2020 with recurrent syncope. Monitor showed brief SVT. Dr. Lovena Le inserted loop recorder 09/20/18. On f/u with Tommye Standard 10/03/18 ILR interogation showed 120 episodes of AFib longest 12 hrs. No brady/tachy. Started on Eliquis.  Patient comes in for f/u. She feels great. No more syncope. Still having palpitations mostly at night-lasts 3-4 min and then goes away.Patient has many questions about recent CT, thyroid and labs. Did have a bout of diverticulitis after eating tomatoes and had a black stool but none since.     Past Medical History:  Diagnosis Date  . Anemia    PMH  . Atrial fibrillation ([place]) 01/01/2007  . BACK PAIN, UPPER 07/09/2008  . Breast cancer, stage 1 ([place])   . Bruises easily   . Cataract    history  . Complication of anesthesia   . Diverticulitis   . DIVERTICULOSIS, COLON 12/26/2006  . Essential hypertension 10/27/2017  . Follicular non-Hodgkin's lymphoma ([place])   . Goiter    multi-nodular  . Hx of colonoscopy 2009  . LIVER FUNCTION TESTS, ABNORMAL, HX OF 12/26/2007  . Memory loss 05/16/2007  . OSTEOARTHRITIS 12/26/2006   takes Diclofenac daily but has stopped for surgery  . PONV (postoperative nausea and vomiting)   . Skin  cancer   . VERTIGO 09/07/2009  . Wears dentures   . Wears glasses   . Wears hearing aid    bilateral    Past Surgical History:  Procedure Laterality Date  . ABDOMINAL HYSTERECTOMY    . ABLATION OF DYSRHYTHMIC FOCUS  2009  . APPENDECTOMY    . AUGMENTATION MAMMAPLASTY Right 2013  . BREAST BIOPSY  2013  . BREAST RECONSTRUCTION  04/14/2011   Procedure: BREAST RECONSTRUCTION;  Surgeon: Crissie Reese, MD;  Location: [facility];  Service: Plastics;  Laterality: Right;  Placement of Right Breast Tissue Expander with  use of Flex HD for Breast Reconstruction  . BREAST SURGERY     right sm, snbx  . CATARACT EXTRACTION W/ INTRAOCULAR LENS  IMPLANT, BILATERAL  2010   bilateral  . COLONOSCOPY    . COLONOSCOPY    . KNEE SURGERY  2004/2010   arthroscopic/ bilateral knee replacements  . MASTECTOMY Right 2013  . PORT-A-CATH REMOVAL N/A 12/07/2016   Procedure: REMOVAL PORT-A-CATH;  Surgeon: Rolm Bookbinder, MD;  Location: [facility];  Service: General;  Laterality: N/A;  . PORTACATH PLACEMENT Right 08/11/2014   Procedure: INSERTION PORT-A-CATH WITH ULTRASOUND;  Surgeon: Rolm Bookbinder, MD;  Location: [facility];  Service: General;  Laterality: Right;  . [place]   right  . tmi  1998  . TONSILLECTOMY      Current Medications: Current Meds  Medication Sig  .  amLODipine (NORVASC) 5 MG tablet Take 1 tablet (5 mg total) by mouth daily.  [name] apixaban (ELIQUIS) 5 MG TABS tablet Take 1 tablet (5 mg total) by mouth 2 (two) times daily.  . furosemide (LASIX) 40 MG tablet Take 1 tablet (40 mg total) by mouth daily.  . meclizine (ANTIVERT) 25 MG tablet Take 1 tablet (25 mg total) by mouth every 6 (six) hours as needed for dizziness.  . meloxicam (MOBIC) 7.5 MG tablet Take 1 tablet (7.5 mg total) by mouth daily.  . metoprolol tartrate (LOPRESSOR) 25 MG tablet Take 1 tablet (25 mg total) by mouth 3 (three) times daily. May take 1 extra tablet as needed for heart rate over 100  . ondansetron (ZOFRAN) 4  MG tablet Take 1 tablet (4 mg total) by mouth every 8 (eight) hours as needed for nausea or vomiting.  . [DISCONTINUED] metoprolol tartrate (LOPRESSOR) 25 MG tablet Take 1 tablet (25 mg total) by mouth 3 (three) times daily.     Allergies:   Codeine phosphate, Adhesive [tape], and Codeine   Social History   Socioeconomic History  . Marital status: Widowed    Spouse name: Not on file  . Number of children: Not on file  . Years of education: Not on file  . Highest education level: Not on file  Occupational History  . Not on file  Social Needs  . Financial resource strain: Not on file  . Food insecurity    Worry: Not on file    Inability: Not on file  . Transportation needs    Medical: Not on file    Non-medical: Not on file  Tobacco Use  . Smoking status: Former Smoker    Packs/day: 0.10    Years: 20.00    Pack years: 2.00    Types: Cigarettes    Quit date: 01/24/1978    Years since quitting: 40.7  . Smokeless tobacco: Never Used  Substance and Sexual Activity  . Alcohol use: No  . Drug use: No  . Sexual activity: Yes    Birth control/protection: Surgical  Lifestyle  . Physical activity    Days per week: Not on file    Minutes per session: Not on file  . Stress: Not on file  Relationships  . Social Herbalist on phone: Not on file    Gets together: Not on file    Attends religious service: Not on file    Active member of club or organization: Not on file    Attends meetings of clubs or organizations: Not on file    Relationship status: Not on file  Other Topics Concern  . Not on file  Social History Narrative  . Not on file     Family History:  The patient's family history includes Cancer in her brother, maternal uncle, mother, and sister; Colon cancer in her maternal aunt and mother; Other in her maternal uncle; Prostate cancer in her maternal uncle and maternal uncle.   ROS:   Please see the history of present illness.    ROS All other systems  reviewed and are negative.   PHYSICAL EXAM:   VS:  BP 124/76   Pulse 84   Ht 5\' 7"  (1.702 m)   Wt 206 lb 3.2 oz (93.5 kg)   SpO2 98%   BMI 32.30 kg/m   Physical Exam  GEN: Well nourished, well developed, in no acute distress  Neck: no JVD, carotid bruits, or masses Cardiac: Irregular irregular no  murmurs, rubs, or gallops  Respiratory:  clear to auscultation bilaterally, normal work of breathing GI: soft, nontender, nondistended, + BS Ext: without cyanosis, clubbing, or edema, Good distal pulses bilaterally Neuro:  Alert and Oriented x 3 Psych: euthymic mood, full affect  Wt Readings from Last 3 Encounters:  10/17/18 206 lb 3.2 oz (93.5 kg)  10/03/18 209 lb (94.8 kg)  09/20/18 206 lb 3.2 oz (93.5 kg)      Studies/Labs Reviewed:   EKG:  EKG is not ordered today.   Recent Labs: 07/17/2018: ALT 22; NT-Pro BNP 720 10/03/2018: Hemoglobin 14.1; Platelets 194; TSH 1.410 10/10/2018: BUN 23; Creatinine, Ser 0.75; Potassium 4.0; Sodium 145   Lipid Panel    Component Value Date/Time   CHOL 169 08/28/2017 1410   TRIG 103.0 08/28/2017 1410   HDL 43.20 08/28/2017 1410   CHOLHDL 4 08/28/2017 1410   VLDL 20.6 08/28/2017 1410   LDLCALC 105 (H) 08/28/2017 1410   LDLDIRECT 133.0 08/23/2016 1403    Additional studies/ records that were reviewed today include:  CT 09/25/18 IMPRESSION: 1. Unchanged mild fusiform aneurysmal dilatation of the ascending thoracic aorta, measuring up to 4.1 cm with preservation of the sino-tubular junction and return to normal caliber by the level of the aortic arch. Recommend semi-annual imaging followup by CTA or MRA and referral to cardiothoracic surgery if not already obtained. This recommendation follows 2010 ACCF/AHA/AATS/ACR/ASA/SCA/SCAI/SIR/STS/SVM Guidelines for the Diagnosis and Management of Patients With Thoracic Aortic Disease. Circulation. 2010; [id number]. Aortic aneurysm NOS (ICD10-I71.9) 2. Cardiomegaly with biatrial enlargement and  reflux of contrast into the hepatic veins, suggestive of right heart failure. 3. Enlarged central pulmonary arteries, suggestive of pulmonary arterial hypertension. This finding may be secondary to early features of interstitial change in architectural distortion throughout the lungs suggesting a nonspecific pulmonary fibrotic interstitial process. 4. Heterogeneous mass within the left lobe thyroid measuring up to 3.5 cm. Finding appears to have a progressively enlarged from numerous remote comparisons. Patient should receive a dedicated thyroid ultrasound if not previously pursued. This follows ACR consensus guidelines: Managing Incidental Thyroid Nodules Detected on Imaging: White Paper of the ACR Incidental Thyroid Findings Committee. J Am Coll Radiol 2015; 12:143-150. 5. Aortic Atherosclerosis (ICD10-I70.0).     Electronically Signed   By: Lovena Le M.D.   On: 09/25/2018 22:15 August 2018-, Long term monitor Patient had a min HR of 42 bpm, max HR of 174 bpm, and avg HR of 59 bpm. Predominant underlying rhythm was Sinus Rhythm. First Degree AV Block was present. 1 run of Ventricular Tachycardia occurred lasting 5 beats with a max rate of 128 bpm (avg 120 bpm). 762 Supraventricular Tachycardia runs occurred, the run with the fastest interval lasting 12 beats with a max rate of 174 bpm, the longest lasting 2 mins 13 secs with an avg rate of 110 bpm. Episode of Supraventricular Tachycardia may be possible Atrial Tachycardia with variable block. Isolated SVEs were rare (<1.0%), SVE Couplets were rare (<1.0%), and SVE Triplets were rare (<1.0%). Isolated VEs were rare (<1.0%), and no VE Couplets or VE Triplets were present     8/2019Study Conclusions   - Left ventricle: The cavity size was normal. Wall thickness was   increased in a pattern of mild LVH. Systolic function was normal.   The estimated ejection fraction was in the range of 55% to 60%.   Wall motion was normal;  there were no regional wall motion   abnormalities. Features are consistent with a pseudonormal  left   ventricular filling pattern, with concomitant abnormal relaxation   and increased filling pressure (grade 2 diastolic dysfunction). - Left atrium: The atrium was mildly dilated. Volume/bsa, ES,   (1-plane Simpson&'s, A2C): 35 ml/m^2.   Monitor 48 hrs 02/2018 Normal sinus rhythm Rare premature ventricular contractions Premature atrial contractions Several runs of supraventricular tachycardia (longest 66 beats) No evidence of atrial fibrillation   Aflutter ablation 2008   IMPRESSION:  1. Normal sinus function.  2. Abnormal atrial function manifested by sustained atrial-typical      flutter.  Catheter ablation successfully interrupted cava tricuspid      isthmus conduction and thereby eliminated the substrate from the      patient's clinical arrhythmia.  3. Normal AV nodal function.  4. Interval His-Purkinje system function.  5. No accessory pathway.  6. Normal ventricular response to programmed stimulation.    SUMMARY OF CONCLUSION:  Results of electrophysiological testing  confirmed cava tricuspid isthmus conduction the presumptive substrate  from the patient's typical atrial flutter.  Catheter ablation across the  cava tricuspid isthmus interrupted conduction bidirectionally and  thereby the substrate.  The patient tolerated the procedure well without  apparent complication.  The patient was transferred to the holding area  as noted previously for sheath removal.   IMPRESSION:  1. Atrial flutter - typical with unusual 3:1 conduction pattern.  The      patient underwent TE cardioversion this morning with normal left      ventricular function and no left atrial appendage thrombus.  Sinus      rhythm was restored.  2. Thromboembolic risk factors are broadly negative.  3. Musculoskeletal disease as noted previously.    DISCUSSION:  Mrs. Vanhuss has atrial flutter, it is  invariably  recurrent.  A [place] score of zero while low enough to not prompt the use  of Coumadin given its own issues is associated with a published stroke  risk of about 1.8%.  The patient, her daughter-in-law and I discussed  this as well as potential benefits of anticoagulation versus potential  benefits of catheter ablation with elimination of the substrate for the  patient's clinical arrhythmia and presumably thereby the associated risk  of thromboembolism.    We will plan to let her go home.  We will schedule her to be seen in the  office the first week in January and then we will tentatively set her up  for ablation in the second week in January having discontinued her  Coumadin in anticipation of that procedure.       ASSESSMENT:    1. Syncope, unspecified syncope type   2. Paroxysmal atrial fibrillation ([name])   3. Chronic diastolic CHF (congestive heart failure) ([place])   4. History of atrioventricular nodal ablation   5. Thoracic aortic aneurysm without rupture ([place])   6. Essential hypertension      PLAN:  In order of problems listed above:   Syncope-ILR inserted 09/20/18 and found to have Afib-Eliquis started. Loop recorder reviewed and she is in A. fib 99.9% of the time.  She did have 16 beats over 150 bpm.  4 beats of bradycardia less than 30.  I told her she can take an extra metoprolol as needed for heart rates over 100.  Follow-up with Dr. Lovena Le as scheduled.  Afib CHADSVASC=3 on Eliquis see above one episode of black stool when she had diverticulitis flare but none since.  I asked her to monitor this closely.  Chronic diastolic CHF-grade 2 DD  no heart failure on exam.  History of Aflutter ablation 2008  Thoracic aortic aneurysm 4. 1 unchanged on CTA earlier this month.  They recommend 73-month follow-up and refer to cardiothoracic surgery.  We will schedule.  Follow-up with Dr. Angelena Form in 6 months.  Essential HTN blood pressure well controlled   Medication  Adjustments/Labs and Tests Ordered: Current medicines are reviewed at length with the patient today.  Concerns regarding medicines are outlined above.  Medication changes, Labs and Tests ordered today are listed in the Patient Instructions below. There are no Patient Instructions on file for this visit.   Sumner Boast, PA-C  10/17/2018 12:14 PM    [facility] [place], [place], [place]  [address] Phone: [phone]; Fax: [phone]

## 2018-10-17 ENCOUNTER — Other Ambulatory Visit: Payer: Self-pay

## 2018-10-17 ENCOUNTER — Ambulatory Visit (INDEPENDENT_AMBULATORY_CARE_PROVIDER_SITE_OTHER): Payer: Medicare Other | Admitting: Physician Assistant

## 2018-10-17 ENCOUNTER — Encounter: Payer: Self-pay | Admitting: Physician Assistant

## 2018-10-17 VITALS — BP 124/76 | HR 84 | Ht 67.0 in | Wt 206.2 lb

## 2018-10-17 DIAGNOSIS — Z9889 Other specified postprocedural states: Secondary | ICD-10-CM

## 2018-10-17 DIAGNOSIS — I712 Thoracic aortic aneurysm, without rupture, unspecified: Secondary | ICD-10-CM

## 2018-10-17 DIAGNOSIS — I480 Paroxysmal atrial fibrillation: Secondary | ICD-10-CM

## 2018-10-17 DIAGNOSIS — I5032 Chronic diastolic (congestive) heart failure: Secondary | ICD-10-CM | POA: Diagnosis not present

## 2018-10-17 DIAGNOSIS — R55 Syncope and collapse: Secondary | ICD-10-CM

## 2018-10-17 DIAGNOSIS — I10 Essential (primary) hypertension: Secondary | ICD-10-CM

## 2018-10-17 MED ORDER — METOPROLOL TARTRATE 25 MG PO TABS: 25.0000 mg | ORAL_TABLET | Freq: Three times a day (TID) | ORAL | 3 refills | Status: DC

## 2018-10-17 NOTE — Patient Instructions (Addendum)
Medication Instructions:  Your physician has recommended you make the following change in your medication:   May take an extra metoprolol as needed for heart rate greater than 100  If you need a refill on your cardiac medications before your next appointment, please call your pharmacy.   Lab work: Your physician recommends that you return for lab work prior to CT Scan in 6 months: BMP  Testing/Procedures: Non-Cardiac CT Angiography (CTA), is a special type of CT scan that uses a computer to produce multi-dimensional views of major blood vessels throughout the body. In CT angiography, a contrast material is injected through an IV to help visualize the blood vessels In 6 months  Follow-Up: At [facility], you and your health needs are our priority.  As part of our continuing mission to provide you with exceptional heart care, we have created designated Provider Care Teams.  These Care Teams include your primary Cardiologist (physician) and Advanced Practice Providers (APPs -  Physician Assistants and Nurse Practitioners) who all work together to provide you with the care you need, when you need it. You will need a follow up appointment in 6 months.  Please call our office 2 months in advance to schedule this appointment.  You may see Lauree Chandler, MD or one of the following Advanced Practice Providers on your designated Care Team:   [address], PA-C Melina Copa, PA-C . Ermalinda Barrios, PA-C  Any Other Special Instructions Will Be Listed Below (If Applicable). Follow-up as scheduled with Dr. Lovena Le Referral to Vascular Clinic

## 2018-10-18 ENCOUNTER — Other Ambulatory Visit: Payer: Medicare Other

## 2018-10-23 ENCOUNTER — Ambulatory Visit (INDEPENDENT_AMBULATORY_CARE_PROVIDER_SITE_OTHER): Payer: Medicare Other | Admitting: *Deleted

## 2018-10-23 DIAGNOSIS — R55 Syncope and collapse: Secondary | ICD-10-CM | POA: Diagnosis not present

## 2018-10-24 LAB — CUP PACEART REMOTE DEVICE CHECK
Date Time Interrogation Session: 20200930081930
Implantable Pulse Generator Implant Date: 20200827

## 2018-10-26 ENCOUNTER — Other Ambulatory Visit: Payer: Self-pay | Admitting: Family Medicine

## 2018-10-26 DIAGNOSIS — M159 Polyosteoarthritis, unspecified: Secondary | ICD-10-CM

## 2018-10-27 ENCOUNTER — Other Ambulatory Visit: Payer: Self-pay | Admitting: Cardiovascular Disease

## 2018-10-27 DIAGNOSIS — I10 Essential (primary) hypertension: Secondary | ICD-10-CM

## 2018-11-02 ENCOUNTER — Encounter: Payer: Medicare Other | Admitting: Cardiothoracic Surgery

## 2018-11-02 NOTE — Progress Notes (Signed)
Carelink Summary Report / Loop Recorder 

## 2018-11-12 ENCOUNTER — Other Ambulatory Visit: Payer: Self-pay | Admitting: Family Medicine

## 2018-11-12 DIAGNOSIS — R42 Dizziness and giddiness: Secondary | ICD-10-CM

## 2018-11-16 ENCOUNTER — Other Ambulatory Visit: Payer: Self-pay

## 2018-11-16 ENCOUNTER — Institutional Professional Consult (permissible substitution): Payer: Medicare Other | Admitting: Cardiothoracic Surgery

## 2018-11-16 VITALS — BP 150/83 | HR 85 | Temp 97.7°F | Resp 20 | Ht 67.0 in | Wt 206.0 lb

## 2018-11-16 DIAGNOSIS — I7121 Aneurysm of the ascending aorta, without rupture: Secondary | ICD-10-CM

## 2018-11-16 DIAGNOSIS — I712 Thoracic aortic aneurysm, without rupture: Secondary | ICD-10-CM

## 2018-11-21 NOTE — Progress Notes (Signed)
[address]       [place] [address]             [place] REPORT  Referring Provider is Imogene Burn, PA-C Primary Cardiologist is Lauree Chandler, MD PCP is Caren Macadam, MD  Chief Complaint  Patient presents with  . Thoracic Aortic Aneurysm    Surgical eval    HPI:  83 yo lady with known asc aortic aneurysm presents for surgical consultation regarding the aneurysm. She was incidentally discovered with aneurysm approximately 18 mos ago during CT for NHL. She denies chest pain or family hx of aneurysm. By echo (8/19) her aortic valve is trileaflet.  She has had multiple surgeries in the past and is not looking forward to having this aneurysm repaired "prophylactically".  Past Medical History:  Diagnosis Date  . Anemia    PMH  . Atrial fibrillation ([place]) 01/01/2007  . BACK PAIN, UPPER 07/09/2008  . Breast cancer, stage 1 ([place])   . Bruises easily   . Cataract    history  . Complication of anesthesia   . Diverticulitis   . DIVERTICULOSIS, COLON 12/26/2006  . Essential hypertension 10/27/2017  . Follicular non-Hodgkin's lymphoma ([place])   . Goiter    multi-nodular  . Hx of colonoscopy 2009  . LIVER FUNCTION TESTS, ABNORMAL, HX OF 12/26/2007  . Memory loss 05/16/2007  . OSTEOARTHRITIS 12/26/2006   takes Diclofenac daily but has stopped for surgery  . PONV (postoperative nausea and vomiting)   . Skin cancer   . VERTIGO 09/07/2009  . Wears dentures   . Wears glasses   . Wears hearing aid    bilateral    Past Surgical History:  Procedure Laterality Date  . ABDOMINAL HYSTERECTOMY    . ABLATION OF DYSRHYTHMIC FOCUS  2009  . APPENDECTOMY    . AUGMENTATION MAMMAPLASTY Right 2013  . BREAST BIOPSY  2013  . BREAST RECONSTRUCTION  04/14/2011   Procedure: BREAST RECONSTRUCTION;  Surgeon: Crissie Reese, MD;  Location: [facility];  Service: Plastics;  Laterality: Right;  Placement of Right Breast Tissue  Expander with  use of Flex HD for Breast Reconstruction  . BREAST SURGERY     right sm, snbx  . CATARACT EXTRACTION W/ INTRAOCULAR LENS  IMPLANT, BILATERAL  2010   bilateral  . COLONOSCOPY    . COLONOSCOPY    . KNEE SURGERY  2004/2010   arthroscopic/ bilateral knee replacements  . MASTECTOMY Right 2013  . PORT-A-CATH REMOVAL N/A 12/07/2016   Procedure: REMOVAL PORT-A-CATH;  Surgeon: Rolm Bookbinder, MD;  Location: [facility];  Service: General;  Laterality: N/A;  . PORTACATH PLACEMENT Right 08/11/2014   Procedure: INSERTION PORT-A-CATH WITH ULTRASOUND;  Surgeon: Rolm Bookbinder, MD;  Location: [facility];  Service: General;  Laterality: Right;  . [place]   right  . tmi  1998  . TONSILLECTOMY      Family History  Problem Relation Age of Onset  . Colon cancer Mother   . Cancer Mother        colon  . Colon cancer Maternal Aunt   . Cancer Maternal Uncle   . Prostate cancer Maternal Uncle   . Prostate cancer Maternal Uncle   . Other Maternal Uncle   . Cancer Sister        kidney  . Cancer Brother        lymph node  . Anesthesia problems Neg  Hx   . Hypotension Neg Hx   . Malignant hyperthermia Neg Hx   . Pseudochol deficiency Neg Hx   . Breast cancer Neg Hx     Social History   Socioeconomic History  . Marital status: Widowed    Spouse name: Not on file  . Number of children: Not on file  . Years of education: Not on file  . Highest education level: Not on file  Occupational History  . Not on file  Social Needs  . Financial resource strain: Not on file  . Food insecurity    Worry: Not on file    Inability: Not on file  . Transportation needs    Medical: Not on file    Non-medical: Not on file  Tobacco Use  . Smoking status: Former Smoker    Packs/day: 0.10    Years: 20.00    Pack years: 2.00    Types: Cigarettes    Quit date: 01/24/1978    Years since quitting: 40.8  . Smokeless tobacco: Never Used  Substance and Sexual Activity  . Alcohol use:  No  . Drug use: No  . Sexual activity: Yes    Birth control/protection: Surgical  Lifestyle  . Physical activity    Days per week: Not on file    Minutes per session: Not on file  . Stress: Not on file  Relationships  . Social Herbalist on phone: Not on file    Gets together: Not on file    Attends religious service: Not on file    Active member of club or organization: Not on file    Attends meetings of clubs or organizations: Not on file    Relationship status: Not on file  . Intimate partner violence    Fear of current or ex partner: Not on file    Emotionally abused: Not on file    Physically abused: Not on file    Forced sexual activity: Not on file  Other Topics Concern  . Not on file  Social History Narrative  . Not on file    Current Outpatient Medications  Medication Sig Dispense Refill  . amLODipine (NORVASC) 5 MG tablet TAKE 1 TABLET BY MOUTH DAILY 90 tablet 2  . apixaban (ELIQUIS) 5 MG TABS tablet Take 1 tablet (5 mg total) by mouth 2 (two) times daily. 60 tablet 6  . furosemide (LASIX) 40 MG tablet Take 1 tablet (40 mg total) by mouth daily. 90 tablet 3  . meclizine (ANTIVERT) 25 MG tablet TAKE 1 TABLET(25 MG) BY MOUTH EVERY 6 HOURS AS NEEDED FOR DIZZINESS 100 tablet 2  . meloxicam (MOBIC) 7.5 MG tablet TAKE 1 TABLET(7.5 MG) BY MOUTH DAILY 90 tablet 1  . metoprolol tartrate (LOPRESSOR) 25 MG tablet Take 1 tablet (25 mg total) by mouth 3 (three) times daily. May take 1 extra tablet as needed for heart rate over 100 270 tablet 3  . ondansetron (ZOFRAN) 4 MG tablet Take 1 tablet (4 mg total) by mouth every 8 (eight) hours as needed for nausea or vomiting. 20 tablet 0   No current facility-administered medications for this visit.     Allergies  Allergen Reactions  . Codeine Phosphate Other (See Comments)    Hallucinations  . Adhesive [Tape]     Electrodes for EKG- skin blistering, erythema  . Codeine Other (See Comments)    Hallucinations        Review of Systems:   General:  good appetite,  good energy, no weight gain or loss  Cardiac:  denies chest pain with exertion or at rest, + SOB with moderate exertion, & occasionally resting SOB,+dizzy spells, syncope  Respiratory:  + shortness of breath,   GI:   neg  GU:   neg  Vascular:  No pain suggestive of claudication, +pain in feet,endorses poor circulation in feet   Neuro:   neg  Musculoskeletal: + arthritis, +difficulty walking, reduced mobility   Skin:   neg  Psych:   neg   Eyes:   + recent vision changes, + wears glasses or contacts  ENT:   + hearing loss, + dentures, last saw dentist 05/04/18  Hematologic:  neg  Endocrine:  neg diabetes, does not check CBG's at home     Physical Exam:   BP (!) 150/83   Pulse 85   Temp 97.7 F (36.5 C) (Skin)   Resp 20   Ht 5\' 7"  (1.702 m)   Wt 93.4 kg   SpO2 95% Comment: RA  BMI 32.26 kg/m   General:   well-appearing, NAD, appears stated age  HEENT:  Unremarkable   Neck:   no JVD, no bruits, no adenopathy   Chest:   clear to auscultation, symmetrical breath sounds, no wheezes, no rhonchi   CV:   RRR, no murmur   Abdomen:  soft, non-tender, no masses   Extremities:  warm, well-perfused, pulses intact throughout, no LE edema  Rectal/GU  Deferred  Neuro:   Grossly non-focal and symmetrical throughout  Skin:   Clean and dry, no rashes, no breakdown   Diagnostic Tests:  CT of chest from 9/20 is reviewed. Agree with interpretation of 4.2 cm asc aortic aneurysm. No other significant abnormalities noted.   Impression:  83 yo lady with several resolving medical problems and a relatively small asc aortic aneurysm.    Plan:  F/u CT chest in one year Present directly to ED for unremitting chest or back pain  I spent in excess of 30 minutes during the conduct of this office consultation and >50% of this time involved direct face-to-face encounter with the patient for counseling and/or coordination of their care.          Level  3 Office Consult = 40 minutes         Level 4 Office Consult = 60 minutes         Level 5 Office Consult = 80 minutes  B. Murvin Natal, MD 11/21/2018 9:38 AM

## 2018-11-26 ENCOUNTER — Ambulatory Visit (INDEPENDENT_AMBULATORY_CARE_PROVIDER_SITE_OTHER): Payer: Medicare Other | Admitting: *Deleted

## 2018-11-26 DIAGNOSIS — I5032 Chronic diastolic (congestive) heart failure: Secondary | ICD-10-CM | POA: Diagnosis not present

## 2018-11-26 DIAGNOSIS — R55 Syncope and collapse: Secondary | ICD-10-CM

## 2018-11-26 LAB — CUP PACEART REMOTE DEVICE CHECK
Date Time Interrogation Session: 20201102083430
Implantable Pulse Generator Implant Date: 20200827

## 2018-12-05 DIAGNOSIS — H353230 Exudative age-related macular degeneration, bilateral, stage unspecified: Secondary | ICD-10-CM | POA: Diagnosis not present

## 2018-12-10 DIAGNOSIS — H35372 Puckering of macula, left eye: Secondary | ICD-10-CM | POA: Diagnosis not present

## 2018-12-10 DIAGNOSIS — H43813 Vitreous degeneration, bilateral: Secondary | ICD-10-CM | POA: Diagnosis not present

## 2018-12-10 DIAGNOSIS — H353132 Nonexudative age-related macular degeneration, bilateral, intermediate dry stage: Secondary | ICD-10-CM | POA: Diagnosis not present

## 2018-12-10 DIAGNOSIS — H35033 Hypertensive retinopathy, bilateral: Secondary | ICD-10-CM | POA: Diagnosis not present

## 2018-12-12 NOTE — Progress Notes (Signed)
Patient Care Team: Caren Macadam, MD as PCP - General (Family Medicine) Burnell Blanks, MD as PCP - Cardiology (Cardiology)  DIAGNOSIS:    ICD-10-CM   1. Follicular 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 ([name])  C82.03   2. Malignant neoplasm of upper-outer quadrant of right breast in female, estrogen receptor positive ([place])  C50.411    Z17.0     SUMMARY OF ONCOLOGIC HISTORY: Oncology History  Breast cancer of upper-outer quadrant of right female breast ([place])  04/14/2011 Surgery   Right mastectomy: 2 foci of invasive ductal carcinoma 0.2 cm, grade 1 with background extensive DCIS, 0/5 lymph nodes negative,focus 1: ER 94%, PR 100%, Ki-67 5%, HER-2 negative: Focus to ER 100%BR 6% Ki-67 29% HER-2 negative   05/02/2011 -  Anti-estrogen oral therapy   letrozole 2.5 mg daily   Follicular 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 ([name])  07/24/2014 Initial Diagnosis   Retroperitoneal mass biopsy left side: Follicular lymphoma low-grade grade 1-2, flow cytometry positive for CD10, 20, 3, 10, 5, BCL-2, BCl-6, CD21, Ki-67 less than 10%   08/13/2014 - 12/06/2016 Chemotherapy   Bendamustine and Rituxan day 1, 2 every 4 weeks 6 cycles followed by Rituxan maintenance every 2 months 2 years completed 12/06/2016   11/03/2014 Imaging   midpoint of chemotherapy: Moderate improvement in the periaortic lymphadenopathy without complete resolution   01/16/2015 PET scan    interval decrease in size and metabolic activity of the large left retroperitoneal mass , no increase in activity, size 10 mm decreased from 48 mm  SUV 1.9 , no additional hypermetabolic activity     CHIEF COMPLIANT: Surveillance of breast cancer and lymphoma  INTERVAL HISTORY: Selena [name] is a 83 y.o. with above-mentioned history of breast cancer and follicular lymphoma. I last saw her a year ago. Mammogram on 06/15/18 showed a hematoma in the left breast following a fall. Mammogram of the left  breast on 10/15/18 showed resolution of the hematoma and no evidence of malignancy. She presents to the clinic today for follow-up.   REVIEW OF SYSTEMS:   Constitutional: Denies fevers, chills or abnormal weight loss Eyes: Denies blurriness of vision Ears, nose, mouth, throat, and face: Denies mucositis or sore throat Respiratory: Denies cough, dyspnea or wheezes Cardiovascular: Denies palpitation, chest discomfort Gastrointestinal: Denies nausea, heartburn or change in bowel habits Skin: Denies abnormal skin rashes Lymphatics: Denies new lymphadenopathy or easy bruising Neurological: Denies numbness, tingling or new weaknesses Behavioral/Psych: Mood is stable, no new changes  Extremities: No lower extremity edema Breast: denies any pain or lumps or nodules in either breasts All other systems were reviewed with the patient and are negative.  I have reviewed the past medical history, past surgical history, social history and family history with the patient and they are unchanged from previous note.  ALLERGIES:  is allergic to codeine phosphate; adhesive [tape]; and codeine.  MEDICATIONS:  Current Outpatient Medications  Medication Sig Dispense Refill  . amLODipine (NORVASC) 5 MG tablet TAKE 1 TABLET BY MOUTH DAILY 90 tablet 2  . apixaban (ELIQUIS) 5 MG TABS tablet Take 1 tablet (5 mg total) by mouth 2 (two) times daily. 60 tablet 6  . furosemide (LASIX) 40 MG tablet Take 1 tablet (40 mg total) by mouth daily. 90 tablet 3  . meclizine (ANTIVERT) 25 MG tablet TAKE 1 TABLET(25 MG) BY MOUTH EVERY 6 HOURS AS NEEDED FOR DIZZINESS 100 tablet 2  . meloxicam (MOBIC) 7.5 MG tablet TAKE 1 TABLET(7.5 MG) BY  MOUTH DAILY 90 tablet 1  . metoprolol tartrate (LOPRESSOR) 25 MG tablet Take 1 tablet (25 mg total) by mouth 3 (three) times daily. May take 1 extra tablet as needed for heart rate over 100 270 tablet 3  . ondansetron (ZOFRAN) 4 MG tablet Take 1 tablet (4 mg total) by mouth every 8 (eight) hours  as needed for nausea or vomiting. 20 tablet 0   No current facility-administered medications for this visit.     PHYSICAL EXAMINATION: ECOG PERFORMANCE STATUS: 1 - Symptomatic but completely ambulatory  Vitals:   12/13/18 1107  BP: 107/65  Pulse: 67  Resp: 17  Temp: 97.6 F (36.4 C)  SpO2: 98%   Filed Weights   12/13/18 1107  Weight: 204 lb 9.6 oz (92.8 kg)    GENERAL: alert, no distress and comfortable SKIN: skin color, texture, turgor are normal, no rashes or significant lesions EYES: normal, Conjunctiva are pink and non-injected, sclera clear OROPHARYNX: no exudate, no erythema and lips, buccal mucosa, and tongue normal  NECK: supple, thyroid normal size, non-tender, without nodularity LYMPH: no palpable lymphadenopathy in the cervical, axillary or inguinal LUNGS: clear to auscultation and percussion with normal breathing effort HEART: regular rate & rhythm and no murmurs and no lower extremity edema ABDOMEN: abdomen soft, non-tender and normal bowel sounds MUSCULOSKELETAL: no cyanosis of digits and no clubbing  NEURO: alert & oriented x 3 with fluent speech, no focal motor/sensory deficits EXTREMITIES: No lower extremity edema BREAST: No palpable masses or nodules in either right or left breasts. No palpable axillary supraclavicular or infraclavicular adenopathy no breast tenderness or nipple discharge. (exam performed in the presence of a chaperone)  LABORATORY DATA:  I have reviewed the data as listed CMP Latest Ref Rng & Units 10/10/2018 08/21/2018 07/17/2018  Glucose 65 - 99 mg/dL 106(H) 94 88  BUN 8 - 27 mg/dL _0 Creatinine 0.57 - 1.00 mg/dL 0.75 0.77 0.67  Sodium 134 - 144 mmol/L 145(H) 144 142  Potassium 3.5 - 5.2 mmol/L 4.0 4.4 4.6  Chloride 96 - 106 mmol/L 105 104 105  CO2 20 - 29 mmol/L _1 Calcium 8.7 - 10.3 mg/dL 9.4 9.4 9.3  Total Protein 6.0 - 8.5 g/dL - - 6.1  Total Bilirubin 0.0 - 1.2 mg/dL - - 0.5  Alkaline Phos 39 - 117 IU/L - - 94   AST 0 - 40 IU/L - - 22  ALT 0 - 32 IU/L - - 22    Lab Results  Component Value Date   WBC 8.1 12/13/2018   HGB 14.6 12/13/2018   HCT 44.2 12/13/2018   MCV 96.1 12/13/2018   PLT 173 12/13/2018   NEUTROABS 5.3 12/13/2018    ASSESSMENT & PLAN:  Breast cancer of upper-outer quadrant of right female breast ([place])  Breast cancer of upper-outer quadrant of right female breast ([place]) Right breast cancer: Right mastectomy 04/14/2011: 2 foci of invasive ductal carcinoma 0.2 cm, grade 1 with background extensive DCIS, 0/5 lymph nodes negative,focus 1: ER 94%, PR 100%, Ki-67 5%, HER-2 negative: Focus to ER 100% PR 6% Ki-67 29% HER-2 negative Femara 2.5 mg daily from2013- 2018. Because her primary tumor was very small and low risk, we did not recommendextended adjuvant therapy.  Breast Cancer Surveillance: 1. Breast exam11/19/20: Right mastectomy 2. Mammogram and U/S9/21/20noabnormalities. Postsurgical changes. Breast Density Category B.     Follicular 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 Retroperitoneal mass biopsy left side 83/33/8329: Follicular lymphoma low-grade grade 1-2,  flow cytometry positive for CD10, 20, 3, 10, 5, BCL-2, BCl-6, CD21, Ki-67 less than 10%, stage IB (8X 4 cm mass)  Treatment plan:systemic chemotherapy with bendamustine and Rituxan 6 cycles ( From 08/13/2014 to 01/01/2015) followed by Rituxan maintenance every 2 months for 2 years completed 12/06/2016  PET/CT scan 01/16/2015: Interval decrease in size and metabolic activity   scans will be done on an as-needed basis. There is no role of routine CT scans for surveillance for follicular lymphoma unless the patient has symptoms. Return to clinic in 1 year for follow-up and after that she can be followed with her primary care physician.    No orders of the defined types were placed in this encounter.  The patient has a good understanding of the overall plan. she agrees with it. she will call  with any problems that may develop before the next visit here.  Selena Lose, MD 12/13/2018  Selena [name], am acting as scribe for Dr. Nicholas [name].  I have reviewed the above documentation for accuracy and completeness, and I agree with the above.

## 2018-12-13 ENCOUNTER — Other Ambulatory Visit: Payer: Self-pay | Admitting: *Deleted

## 2018-12-13 ENCOUNTER — Inpatient Hospital Stay: Payer: Medicare Other | Attending: Hematology and Oncology

## 2018-12-13 ENCOUNTER — Other Ambulatory Visit: Payer: Self-pay

## 2018-12-13 ENCOUNTER — Inpatient Hospital Stay: Payer: Medicare Other | Admitting: Hematology and Oncology

## 2018-12-13 VITALS — BP 107/65 | HR 67 | Temp 97.6°F | Resp 17 | Ht 67.0 in | Wt 204.6 lb

## 2018-12-13 DIAGNOSIS — Z79899 Other long term (current) drug therapy: Secondary | ICD-10-CM | POA: Insufficient documentation

## 2018-12-13 DIAGNOSIS — Z7901 Long term (current) use of anticoagulants: Secondary | ICD-10-CM | POA: Insufficient documentation

## 2018-12-13 DIAGNOSIS — C50411 Malignant neoplasm of upper-outer quadrant of right female breast: Secondary | ICD-10-CM | POA: Diagnosis not present

## 2018-12-13 DIAGNOSIS — Z79811 Long term (current) use of aromatase inhibitors: Secondary | ICD-10-CM | POA: Diagnosis not present

## 2018-12-13 DIAGNOSIS — Z170 Estrogen receptor positive status [ER+]: Secondary | ICD-10-CM | POA: Diagnosis not present

## 2018-12-13 DIAGNOSIS — C8203 Follicular lymphoma grade I, intra-abdominal lymph nodes: Secondary | ICD-10-CM

## 2018-12-13 LAB — CMP (CANCER CENTER ONLY)
ALT: 22 U/L (ref 0–44)
AST: 20 U/L (ref 15–41)
Albumin: 4 g/dL (ref 3.5–5.0)
Alkaline Phosphatase: 90 U/L (ref 38–126)
Anion gap: 9 (ref 5–15)
BUN: 24 mg/dL — ABNORMAL HIGH (ref 8–23)
CO2: 31 mmol/L (ref 22–32)
Calcium: 9.2 mg/dL (ref 8.9–10.3)
Chloride: 104 mmol/L (ref 98–111)
Creatinine: 0.79 mg/dL (ref 0.44–1.00)
GFR, Est AFR Am: >60 mL/min (ref >60)
GFR, Estimated: >60 mL/min (ref >60)
Glucose, Bld: 94 mg/dL (ref 70–99)
Potassium: 4.5 mmol/L (ref 3.5–5.1)
Sodium: 144 mmol/L (ref 135–145)
Total Bilirubin: 0.8 mg/dL (ref 0.3–1.2)
Total Protein: 6.5 g/dL (ref 6.5–8.1)

## 2018-12-13 LAB — CBC WITH DIFFERENTIAL (CANCER CENTER ONLY)
Abs Immature Granulocytes: 0.03 10*3/uL (ref 0.00–0.07)
Basophils Absolute: 0.1 10*3/uL (ref 0.0–0.1)
Basophils Relative: 1 %
Eosinophils Absolute: 0.3 10*3/uL (ref 0.0–0.5)
Eosinophils Relative: 4 %
HCT: 44.2 % (ref 36.0–46.0)
Hemoglobin: 14.6 g/dL (ref 12.0–15.0)
Immature Granulocytes: 0 %
Lymphocytes Relative: 17 %
Lymphs Abs: 1.3 10*3/uL (ref 0.7–4.0)
MCH: 31.7 pg (ref 26.0–34.0)
MCHC: 33 g/dL (ref 30.0–36.0)
MCV: 96.1 fL (ref 80.0–100.0)
Monocytes Absolute: 1.1 10*3/uL — ABNORMAL HIGH (ref 0.1–1.0)
Monocytes Relative: 13 %
Neutro Abs: 5.3 10*3/uL (ref 1.7–7.7)
Neutrophils Relative %: 65 %
Platelet Count: 173 10*3/uL (ref 150–400)
RBC: 4.6 MIL/uL (ref 3.87–5.11)
RDW: 12.7 % (ref 11.5–15.5)
WBC Count: 8.1 10*3/uL (ref 4.0–10.5)
nRBC: 0 % (ref 0.0–0.2)

## 2018-12-13 LAB — LACTATE DEHYDROGENASE: LDH: 220 U/L — ABNORMAL HIGH (ref 98–192)

## 2018-12-13 NOTE — Assessment & Plan Note (Signed)
Retroperitoneal mass biopsy left side 27/61/8485: Follicular lymphoma low-grade grade 1-2, flow cytometry positive for CD10, 20, 3, 10, 5, BCL-2, BCl-6, CD21, Ki-67 less than 10%, stage IB (8X 4 cm mass)  Treatment plan:systemic chemotherapy with bendamustine and Rituxan 6 cycles ( From 08/13/2014 to 01/01/2015) followed by Rituxan maintenance every 2 months for 2 years completed 12/06/2016  PET/CT scan 01/16/2015: Interval decrease in size and metabolic activity   scans will be done on an as-needed basis. There is no role of routine CT scans for surveillance for follicular lymphoma unless the patient has symptoms.

## 2018-12-13 NOTE — Assessment & Plan Note (Signed)
  Breast cancer of upper-outer quadrant of right female breast [name]) Right breast cancer: Right mastectomy 04/14/2011: 2 foci of invasive ductal carcinoma 0.2 cm, grade 1 with background extensive DCIS, 0/5 lymph nodes negative,focus 1: ER 94%, PR 100%, Ki-67 5%, HER-2 negative: Focus to ER 100% PR 6% Ki-67 29% HER-2 negative Femara 2.5 mg daily from2013- 2018. Because her primary tumor was very small and low risk, we did not recommendextended adjuvant therapy.  Breast Cancer Surveillance: 1. Breast exam11/19/20: Right mastectomy 2. Mammogram and U/S9/21/20noabnormalities. Postsurgical changes. Breast Density Category B.  Return to clinic in 1 year followup

## 2018-12-14 ENCOUNTER — Telehealth: Payer: Self-pay | Admitting: Hematology and Oncology

## 2018-12-14 NOTE — Telephone Encounter (Signed)
Called and left msg for 12/13/19 appt. Mailed printout

## 2018-12-18 ENCOUNTER — Other Ambulatory Visit: Payer: Self-pay

## 2018-12-18 ENCOUNTER — Encounter: Payer: Self-pay | Admitting: Internal Medicine

## 2018-12-18 ENCOUNTER — Ambulatory Visit: Payer: Medicare Other | Admitting: Internal Medicine

## 2018-12-18 VITALS — BP 132/76 | HR 79 | Ht 67.0 in | Wt 207.0 lb

## 2018-12-18 DIAGNOSIS — I480 Paroxysmal atrial fibrillation: Secondary | ICD-10-CM

## 2018-12-18 DIAGNOSIS — R55 Syncope and collapse: Secondary | ICD-10-CM

## 2018-12-18 NOTE — Patient Instructions (Signed)

## 2018-12-18 NOTE — Progress Notes (Signed)
HPI Ms. Selena [name] returns today for evaluation of unexplained syncope s/p ILR insertion 3 months ago. She has not had more syncope. She feels her heart race at times. She has not passed out. She denies any pain from her ILR insertion. No edema.  Allergies  Allergen Reactions  . Codeine Phosphate Other (See Comments)    Hallucinations  . Adhesive [Tape]     Electrodes for EKG- skin blistering, erythema  . Codeine Other (See Comments)    Hallucinations      Current Outpatient Medications  Medication Sig Dispense Refill  . amLODipine (NORVASC) 5 MG tablet TAKE 1 TABLET BY MOUTH DAILY 90 tablet 2  . apixaban (ELIQUIS) 5 MG TABS tablet Take 1 tablet (5 mg total) by mouth 2 (two) times daily. 60 tablet 6  . furosemide (LASIX) 20 MG tablet Take 20 mg by mouth daily.    . meclizine (ANTIVERT) 25 MG tablet TAKE 1 TABLET(25 MG) BY MOUTH EVERY 6 HOURS AS NEEDED FOR DIZZINESS 100 tablet 2  . meloxicam (MOBIC) 7.5 MG tablet TAKE 1 TABLET(7.5 MG) BY MOUTH DAILY 90 tablet 1  . metoprolol tartrate (LOPRESSOR) 25 MG tablet Take 1 tablet (25 mg total) by mouth 3 (three) times daily. May take 1 extra tablet as needed for heart rate over 100 270 tablet 3  . ondansetron (ZOFRAN) 4 MG tablet Take 1 tablet (4 mg total) by mouth every 8 (eight) hours as needed for nausea or vomiting. 20 tablet 0   No current facility-administered medications for this visit.      Past Medical History:  Diagnosis Date  . Anemia    PMH  . Atrial fibrillation ([place]) 01/01/2007  . BACK PAIN, UPPER 07/09/2008  . Breast cancer, stage 1 ([place])   . Bruises easily   . Cataract    history  . Complication of anesthesia   . Diverticulitis   . DIVERTICULOSIS, COLON 12/26/2006  . Essential hypertension 10/27/2017  . Follicular non-Hodgkin's lymphoma ([place])   . Goiter    multi-nodular  . Hx of colonoscopy 2009  . LIVER FUNCTION TESTS, ABNORMAL, HX OF 12/26/2007  . Memory loss 05/16/2007  . OSTEOARTHRITIS 12/26/2006   takes  Diclofenac daily but has stopped for surgery  . PONV (postoperative nausea and vomiting)   . Skin cancer   . VERTIGO 09/07/2009  . Wears dentures   . Wears glasses   . Wears hearing aid    bilateral    ROS:   All systems reviewed and negative except as noted in the HPI.   Past Surgical History:  Procedure Laterality Date  . ABDOMINAL HYSTERECTOMY    . ABLATION OF DYSRHYTHMIC FOCUS  2009  . APPENDECTOMY    . AUGMENTATION MAMMAPLASTY Right 2013  . BREAST BIOPSY  2013  . BREAST RECONSTRUCTION  04/14/2011   Procedure: BREAST RECONSTRUCTION;  Surgeon: Crissie Reese, MD;  Location: [facility];  Service: Plastics;  Laterality: Right;  Placement of Right Breast Tissue Expander with  use of Flex HD for Breast Reconstruction  . BREAST SURGERY     right sm, snbx  . CATARACT EXTRACTION W/ INTRAOCULAR LENS  IMPLANT, BILATERAL  2010   bilateral  . COLONOSCOPY    . COLONOSCOPY    . KNEE SURGERY  2004/2010   arthroscopic/ bilateral knee replacements  . MASTECTOMY Right 2013  . PORT-A-CATH REMOVAL N/A 12/07/2016   Procedure: REMOVAL PORT-A-CATH;  Surgeon: Rolm Bookbinder, MD;  Location: [facility];  Service: General;  Laterality: N/A;  .  PORTACATH PLACEMENT Right 08/11/2014   Procedure: INSERTION PORT-A-CATH WITH ULTRASOUND;  Surgeon: Rolm Bookbinder, MD;  Location: [facility];  Service: General;  Laterality: Right;  . [place]   right  . tmi  1998  . TONSILLECTOMY       Family History  Problem Relation Age of Onset  . Colon cancer Mother   . Cancer Mother        colon  . Colon cancer Maternal Aunt   . Cancer Maternal Uncle   . Prostate cancer Maternal Uncle   . Prostate cancer Maternal Uncle   . Other Maternal Uncle   . Cancer Sister        kidney  . Cancer Brother        lymph node  . Anesthesia problems Neg Hx   . Hypotension Neg Hx   . Malignant hyperthermia Neg Hx   . Pseudochol deficiency Neg Hx   . Breast cancer Neg Hx      Social History   Socioeconomic  History  . Marital status: Widowed    Spouse name: Not on file  . Number of children: Not on file  . Years of education: Not on file  . Highest education level: Not on file  Occupational History  . Not on file  Social Needs  . Financial resource strain: Not on file  . Food insecurity    Worry: Not on file    Inability: Not on file  . Transportation needs    Medical: Not on file    Non-medical: Not on file  Tobacco Use  . Smoking status: Former Smoker    Packs/day: 0.10    Years: 20.00    Pack years: 2.00    Types: Cigarettes    Quit date: 01/24/1978    Years since quitting: 40.9  . Smokeless tobacco: Never Used  Substance and Sexual Activity  . Alcohol use: No  . Drug use: No  . Sexual activity: Yes    Birth control/protection: Surgical  Lifestyle  . Physical activity    Days per week: Not on file    Minutes per session: Not on file  . Stress: Not on file  Relationships  . Social Herbalist on phone: Not on file    Gets together: Not on file    Attends religious service: Not on file    Active member of club or organization: Not on file    Attends meetings of clubs or organizations: Not on file    Relationship status: Not on file  . Intimate partner violence    Fear of current or ex partner: Not on file    Emotionally abused: Not on file    Physically abused: Not on file    Forced sexual activity: Not on file  Other Topics Concern  . Not on file  Social History Narrative  . Not on file     BP 132/76   Pulse 79   Ht 5\' 7"  (1.702 m)   Wt 207 lb (93.9 kg)   SpO2 97%   BMI 32.42 kg/m   Physical Exam:  Well appearing NAD HEENT: Unremarkable Neck:  No JVD, no thyromegally Lymphatics:  No adenopathy Back:  No CVA tenderness Lungs:  Clear with no wheezes HEART:  Regular rate rhythm, no murmurs, no rubs, no clicks Abd:  soft, positive bowel sounds, no organomegally, no rebound, no guarding Ext:  2 plus pulses, no edema, no cyanosis, no clubbing  Skin:  No rashes no nodules Neuro:  CN II through XII intact, motor grossly intact  DEVICE  Normal device function.  See PaceArt for details.   Assess/Plan: 1. Atrial fib - her rate is mostly controlled. She has had no bradycardia.  2. Syncope - she has had no recurrent syncope.  3. Thoracic aneurysm - the patient has been told she is not a candidate for aneurysm surgery. I recommended she not continue to undergo CT scans of the thorax.  Darlinda Bellows,M.D.  Mikle Bosworth.D.

## 2018-12-24 ENCOUNTER — Telehealth: Payer: Self-pay | Admitting: *Deleted

## 2018-12-24 NOTE — Telephone Encounter (Signed)
Spoke with patient regarding pause episodes detected between 12/20/18-12/23/18. Pt reports that either on Thursday or Friday of last week, she had a fall after walking down to her outside trash cans, turned, and somehow ended up on her knees on the ground. Able to get back up, no apparent injury, denies hitting her head or LOC. No dizziness prior to the fall that she can recall. Unable to pinpoint exact date/time, so unable to correlate with detected pause episodes. Pt reports compliance with cardiac medications. Advised I will discuss with Dr. Lovena Le and get back to her with any new recommendations. Pt in agreement with plan. No further questions at this time.

## 2018-12-24 NOTE — Progress Notes (Signed)
Carelink Summary Report / Loop Recorder 

## 2018-12-26 NOTE — Telephone Encounter (Signed)
Discussed with Dr. Lovena Le on 12/24/18--per Dr. Lovena Le, reprogram pause detection to 4sec and encourage patient to use symptom activator for symptom episodes. Pause reprogramming sent remotely via [place].  Spoke with patient. She reports that her daughter reminded her that her fall occurred on 12/19/18, not correlating with any detected pause episodes. Explained symptom activator use for dizziness, falls, or syncope. Encouraged pt to call our office when she uses the activator so that we can review episodes. Pt verbalizes understanding, no further questions at this time.

## 2018-12-28 ENCOUNTER — Ambulatory Visit (INDEPENDENT_AMBULATORY_CARE_PROVIDER_SITE_OTHER): Payer: Medicare Other | Admitting: *Deleted

## 2018-12-28 DIAGNOSIS — I480 Paroxysmal atrial fibrillation: Secondary | ICD-10-CM

## 2018-12-30 LAB — CUP PACEART REMOTE DEVICE CHECK
Date Time Interrogation Session: 20201205112016
Implantable Pulse Generator Implant Date: 20200827

## 2019-01-04 ENCOUNTER — Telehealth: Payer: Self-pay | Admitting: Family Medicine

## 2019-01-04 NOTE — Telephone Encounter (Signed)
Pt states she received a letter from her insurance that meclizine (ANTIVERT) 25 MG tablet will no longer be covered.  Pt wants to know what she needs to do.

## 2019-01-04 NOTE — Telephone Encounter (Signed)
Accidentally hit sign encounter instead of Route.  Sending to office.

## 2019-01-04 NOTE — Telephone Encounter (Signed)
We can print and she can cash pay for this at pharmacy ([facility] or [facility] look cheapest there in system). She can also buy otc but might be less expensive to get rx. Ok to print for my signature if she needs now.

## 2019-01-07 DIAGNOSIS — H43813 Vitreous degeneration, bilateral: Secondary | ICD-10-CM | POA: Diagnosis not present

## 2019-01-07 DIAGNOSIS — H35033 Hypertensive retinopathy, bilateral: Secondary | ICD-10-CM | POA: Diagnosis not present

## 2019-01-07 DIAGNOSIS — H353132 Nonexudative age-related macular degeneration, bilateral, intermediate dry stage: Secondary | ICD-10-CM | POA: Diagnosis not present

## 2019-01-07 DIAGNOSIS — H35372 Puckering of macula, left eye: Secondary | ICD-10-CM | POA: Diagnosis not present

## 2019-01-07 NOTE — Telephone Encounter (Signed)
Spoke with the pt and informed her of the message below.  Patient stated she will check for the over the counter medication and I advised she call back if needed.

## 2019-01-15 ENCOUNTER — Encounter (HOSPITAL_COMMUNITY): Payer: Self-pay

## 2019-01-15 ENCOUNTER — Other Ambulatory Visit: Payer: Self-pay

## 2019-01-15 ENCOUNTER — Ambulatory Visit: Payer: Self-pay | Admitting: *Deleted

## 2019-01-15 ENCOUNTER — Ambulatory Visit (HOSPITAL_COMMUNITY)
Admission: EM | Admit: 2019-01-15 | Discharge: 2019-01-15 | Disposition: A | Discharge status: Home / Self Care | Payer: Medicare Other | Attending: Family Medicine | Admitting: Family Medicine

## 2019-01-15 DIAGNOSIS — R195 Other fecal abnormalities: Secondary | ICD-10-CM | POA: Diagnosis not present

## 2019-01-15 HISTORY — DX: Coronary artery aneurysm: I25.41

## 2019-01-15 NOTE — Telephone Encounter (Signed)
Pt called with having a black stool today and yesterday. She denies diarrhea, abd pain, red stools, dizziness, or fever. She takes Eliquis every day. She has not changed her diet or started on any new medication. She take a dose of Pepto bismol on Sunday for abd pain. Per protocol pt should go to ED or PCP alternate with approval. Checking with flow at [facility] for recommendation.  Providers only doing virtual appointments. Pt advised to go to the ED. She voiced understanding. Routing to the practice for review.   Reason for Disposition . Taking Coumadin (warfarin) or other strong blood thinner, or known bleeding disorder (e.g., thrombocytopenia)  Answer Assessment - Initial Assessment Questions 1. COLOR: "What color is it?" "Is that color in part or all of the stool?"     The whole stool was black 2. ONSET: "When was the unusual color first noted?"     yesterday 3. CAUSE: "Have you eaten any food or taken any medicine of this color?" (See listing in BACKGROUND)     no 4. OTHER SYMPTOMS: "Do you have any other symptoms?" (e.g., diarrhea, jaundice, abdominal pain, fever).     no  Protocols used: RECTAL BLEEDING-A-AH, STOOLS - UNUSUAL COLOR-A-AH

## 2019-01-15 NOTE — Discharge Instructions (Addendum)
The test for blood in your stool was negative Follow-up with your primary care doctor

## 2019-01-15 NOTE — ED Provider Notes (Signed)
[place]    CSN: [id number] Arrival date & time: 01/15/19  1341      History   Chief Complaint Chief Complaint  Patient presents with  . Melena    HPI Selena [name] is a 83 y.o. female.   HPI  Patient is here for evaluation because she called her physician told him that her stool was black.  She admitted to using Pepto-Bismol 2 days ago.  She is persisting and having black stools.  No abdominal pain.  No pain with defecation.  Patient states she used Pepto-Bismol on Sunday because she had a couple loose stool.  She thinks this is from something she ate that did not agree with her. No nausea vomiting.  No fever chills.  No known exposure to any illness No GI disturbance in the past that would cause GI bleeding Patient is on Coumadin.  Past Medical History:  Diagnosis Date  . Anemia    PMH  . Aneurysm (arteriovenous) of coronary vessels   . Atrial fibrillation ([place]) 01/01/2007  . BACK PAIN, UPPER 07/09/2008  . Breast cancer, stage 1 ([facility])   . Bruises easily   . Cataract    history  . Complication of anesthesia   . Diverticulitis   . DIVERTICULOSIS, COLON 12/26/2006  . Essential hypertension 10/27/2017  . Follicular non-Hodgkin's lymphoma ([place])   . Goiter    multi-nodular  . Hx of colonoscopy 2009  . LIVER FUNCTION TESTS, ABNORMAL, HX OF 12/26/2007  . Memory loss 05/16/2007  . OSTEOARTHRITIS 12/26/2006   takes Diclofenac daily but has stopped for surgery  . PONV (postoperative nausea and vomiting)   . Skin cancer   . VERTIGO 09/07/2009  . Wears dentures   . Wears glasses   . Wears hearing aid    bilateral    Patient Active Problem List   Diagnosis Date Noted  . Chronic diastolic CHF (congestive heart failure) ([place]) 10/16/2018  . History of atrioventricular nodal ablation 10/16/2018  . Thoracic aortic aneurysm ([place]) 10/16/2018  . Syncope 08/21/2018  . S/P ablation of atrial flutter 07/17/2018  . Upper airway cough syndrome 10/27/2017  .  Essential hypertension 10/27/2017  . DOE (dyspnea on exertion) 10/26/2017  . Anxiety 09/10/2014  . Insomnia 09/10/2014  . Follicular 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 ([place]) 07/31/2014  . Paraspinal mass 07/16/2014  . Breast cancer of upper-outer quadrant of right female breast ([place]) 03/21/2011  . VERTIGO 09/07/2009  . Backache 07/09/2008  . FATIGUE 03/18/2008  . LFTs abnormal 12/26/2007  . MEMORY LOSS 05/16/2007  . ATRIAL FIBRILLATION 01/01/2007  . DIVERTICULOSIS, COLON 12/26/2006  . Osteoarthritis 12/26/2006    Past Surgical History:  Procedure Laterality Date  . ABDOMINAL HYSTERECTOMY    . ABLATION OF DYSRHYTHMIC FOCUS  2009  . APPENDECTOMY    . AUGMENTATION MAMMAPLASTY Right 2013  . BREAST BIOPSY  2013  . BREAST RECONSTRUCTION  04/14/2011   Procedure: BREAST RECONSTRUCTION;  Surgeon: Crissie Reese, MD;  Location: [place];  Service: Plastics;  Laterality: Right;  Placement of Right Breast Tissue Expander with  use of Flex HD for Breast Reconstruction  . BREAST SURGERY     right sm, snbx  . CATARACT EXTRACTION W/ INTRAOCULAR LENS  IMPLANT, BILATERAL  2010   bilateral  . COLONOSCOPY    . COLONOSCOPY    . KNEE SURGERY  2004/2010   arthroscopic/ bilateral knee replacements  . LOOP RECORDER IMPLANT    . MASTECTOMY Right 2013  . PORT-A-CATH REMOVAL  N/A 12/07/2016   Procedure: REMOVAL PORT-A-CATH;  Surgeon: Rolm Bookbinder, MD;  Location: [place];  Service: General;  Laterality: N/A;  . PORTACATH PLACEMENT Right 08/11/2014   Procedure: INSERTION PORT-A-CATH WITH ULTRASOUND;  Surgeon: Rolm Bookbinder, MD;  Location: [place];  Service: General;  Laterality: Right;  . [place]   right  . tmi  1998  . TONSILLECTOMY      OB History   No obstetric history on file.      Home Medications    Prior to Admission medications   Medication Sig Start Date End Date Taking? Authorizing Provider  amLODipine (NORVASC) 5 MG tablet TAKE 1 TABLET BY MOUTH DAILY  10/29/18   Burnell Blanks, MD  apixaban (ELIQUIS) 5 MG TABS tablet Take 1 tablet (5 mg total) by mouth 2 (two) times daily. 10/03/18   Baldwin Jamaica, PA-C  furosemide (LASIX) 20 MG tablet Take 20 mg by mouth daily.    [provider]  meclizine (ANTIVERT) 25 MG tablet TAKE 1 TABLET(25 MG) BY MOUTH EVERY 6 HOURS AS NEEDED FOR DIZZINESS 11/13/18   Koberlein, Andris Flurry C, MD  meloxicam (MOBIC) 7.5 MG tablet TAKE 1 TABLET(7.5 MG) BY MOUTH DAILY 10/26/18   Koberlein, Steele Berg, MD  metoprolol tartrate (LOPRESSOR) 25 MG tablet Take 1 tablet (25 mg total) by mouth 3 (three) times daily. May take 1 extra tablet as needed for heart rate over 100 10/17/18   Lenze, Jennye Moccasin, PA-C  ondansetron (ZOFRAN) 4 MG tablet Take 1 tablet (4 mg total) by mouth every 8 (eight) hours as needed for nausea or vomiting. 08/23/16   Marletta Lor, MD    Family History Family History  Problem Relation Age of Onset  . Colon cancer Mother   . Cancer Mother        colon  . Colon cancer Maternal Aunt   . Cancer Maternal Uncle   . Prostate cancer Maternal Uncle   . Prostate cancer Maternal Uncle   . Other Maternal Uncle   . Cancer Sister        kidney  . Cancer Brother        lymph node  . Anesthesia problems Neg Hx   . Hypotension Neg Hx   . Malignant hyperthermia Neg Hx   . Pseudochol deficiency Neg Hx   . Breast cancer Neg Hx     Social History Social History   Tobacco Use  . Smoking status: Former Smoker    Packs/day: 0.10    Years: 20.00    Pack years: 2.00    Types: Cigarettes    Quit date: 01/24/1978    Years since quitting: 41.0  . Smokeless tobacco: Never Used  Substance Use Topics  . Alcohol use: No  . Drug use: No     Allergies   Codeine phosphate, Adhesive [tape], and Codeine   Review of Systems Review of Systems  Constitutional: Negative for chills and fever.  HENT: Negative for congestion and hearing loss.   Eyes: Negative for pain.  Respiratory: Negative for  cough and shortness of breath.   Cardiovascular: Negative for chest pain and leg swelling.  Gastrointestinal: Negative for abdominal pain, constipation and diarrhea.       Possible blood in stool  Genitourinary: Negative for dysuria and frequency.  Musculoskeletal: Negative for myalgias.  Neurological: Negative for dizziness, seizures and headaches.  Psychiatric/Behavioral: The patient is not nervous/anxious.      Physical Exam Triage Vital Signs ED Triage  Vitals  Enc Vitals Group     BP 01/15/19 1412 125/85     Pulse Rate 01/15/19 1412 89     Resp 01/15/19 1412 18     Temp 01/15/19 1412 97.9 F (36.6 C)     Temp src --      SpO2 01/15/19 1412 97 %     Weight --      Height --      Head Circumference --      Peak Flow --      Pain Score 01/15/19 1410 0     Pain Loc --      Pain Edu? --      Excl. in [place]? --    No data found.  Updated Vital Signs BP 125/85   Pulse 89   Temp 97.9 F (36.6 C)   Resp 18   SpO2 97%      Physical Exam Constitutional:      General: She is not in acute distress.    Appearance: She is well-developed.  HENT:     Head: Normocephalic and atraumatic.     Mouth/Throat:     Comments: Mask in place Eyes:     Conjunctiva/sclera: Conjunctivae normal.     Pupils: Pupils are equal, round, and reactive to light.  Cardiovascular:     Rate and Rhythm: Normal rate.  Pulmonary:     Effort: Pulmonary effort is normal. No respiratory distress.  Abdominal:     General: There is no distension.     Palpations: Abdomen is soft.  Genitourinary:    Rectum: Normal. Guaiac result negative.  Musculoskeletal:        General: Normal range of motion.     Cervical back: Normal range of motion.  Skin:    General: Skin is warm and dry.  Neurological:     Mental Status: She is alert.  Psychiatric:        Mood and Affect: Mood normal.        Behavior: Behavior normal.      UC Treatments / Results  Labs (all labs ordered are listed, but only abnormal  results are displayed) Labs Reviewed  POC OCCULT BLOOD, ED    EKG   Radiology No results found.  Procedures Procedures (including critical care time)  Medications Ordered in UC Medications - No data to display  Initial Impression / Assessment and Plan / UC Course  I have reviewed the triage vital signs and the nursing notes.  Pertinent labs & imaging results that were available during my care of the patient were reviewed by me and considered in my medical decision making (see chart for details).     Patient is relieved to hear that her stool is Hemoccult negative. Final Clinical Impressions(s) / UC Diagnoses   Final diagnoses:  Change in stool     Discharge Instructions     The test for blood in your stool was negative Follow-up with your primary care doctor   ED Prescriptions    None     PDMP not reviewed this encounter.   Raylene Everts, MD 01/15/19 2104

## 2019-01-15 NOTE — Telephone Encounter (Signed)
Routed to Dr Ethlyn Gallery.

## 2019-01-15 NOTE — ED Triage Notes (Signed)
Pt presents with complaints of black stool that started on Monday morning. The patient states this occurred after taking pepto bismal on Sunday for abdominal pain and frequent bowel movements. The patient denies any pain or symptoms at this time. Pt reports taking a blood thinner at home.

## 2019-01-15 NOTE — Telephone Encounter (Signed)
Message Routed to PCP CMA 

## 2019-01-16 NOTE — Telephone Encounter (Signed)
Patient informed of the message below.  Stated she is doing well and will call back if needed. Message sent to Dr Ethlyn Gallery as Juluis Rainier.

## 2019-01-16 NOTE — Telephone Encounter (Signed)
Noted  

## 2019-01-16 NOTE — Telephone Encounter (Signed)
She went to urgent care and test for blood in stool was negative. Pepto Bismol can darken stools? Maybe this was the cause? If having any ongoing issues let me know please. Schedule follow up if needed.

## 2019-01-21 LAB — OCCULT BLOOD, POC DEVICE: Fecal Occult Bld: NEGATIVE

## 2019-01-21 LAB — POC OCCULT BLOOD, ED: Fecal Occult Bld: NEGATIVE

## 2019-01-25 DIAGNOSIS — K259 Gastric ulcer, unspecified as acute or chronic, without hemorrhage or perforation: Secondary | ICD-10-CM

## 2019-01-25 HISTORY — DX: Gastric ulcer, unspecified as acute or chronic, without hemorrhage or perforation: K25.9

## 2019-01-27 ENCOUNTER — Other Ambulatory Visit: Payer: Self-pay | Admitting: Family Medicine

## 2019-01-27 DIAGNOSIS — I10 Essential (primary) hypertension: Secondary | ICD-10-CM

## 2019-01-30 ENCOUNTER — Ambulatory Visit (INDEPENDENT_AMBULATORY_CARE_PROVIDER_SITE_OTHER): Payer: Medicare Other | Admitting: *Deleted

## 2019-01-30 DIAGNOSIS — I480 Paroxysmal atrial fibrillation: Secondary | ICD-10-CM | POA: Diagnosis not present

## 2019-01-31 LAB — CUP PACEART REMOTE DEVICE CHECK
Date Time Interrogation Session: 20210107163010
Implantable Pulse Generator Implant Date: 20200827

## 2019-02-20 ENCOUNTER — Other Ambulatory Visit: Payer: Self-pay | Admitting: Physician Assistant

## 2019-02-20 NOTE — Telephone Encounter (Signed)
Called patient to confirm - she is taking lasix 20 mg daily. Sent refill to pharmacy and scheduled her 6 month follow up with Dr. Angelena Form Pt grateful for assistance.

## 2019-03-04 ENCOUNTER — Ambulatory Visit (INDEPENDENT_AMBULATORY_CARE_PROVIDER_SITE_OTHER): Payer: Medicare Other | Admitting: *Deleted

## 2019-03-04 DIAGNOSIS — I480 Paroxysmal atrial fibrillation: Secondary | ICD-10-CM | POA: Diagnosis not present

## 2019-03-05 ENCOUNTER — Telehealth: Payer: Self-pay | Admitting: Family Medicine

## 2019-03-05 LAB — CUP PACEART REMOTE DEVICE CHECK
Date Time Interrogation Session: 20210208070401
Implantable Pulse Generator Implant Date: 20200827

## 2019-03-05 NOTE — Telephone Encounter (Signed)
Pt calling in stating that her Rx meclizine is no longer covered by the insurance company per the pharmacy and does not know what other medication is going to be covered.  Pt is aware to call her insurance company to find out what medication will be covered.  Pt state that she will give them a call and call us back so that she have it called in.

## 2019-03-05 NOTE — Progress Notes (Signed)
ILR Remote 

## 2019-03-06 MED ORDER — MECLIZINE HCL 12.5 MG PO TABS: 12.5000 mg | ORAL_TABLET | Freq: Three times a day (TID) | ORAL | 1 refills | Status: DC | PRN

## 2019-03-06 NOTE — Telephone Encounter (Signed)
We have this issue yearly. It is easier to print rx and she can cash pay for this. They don't like covering anything sedating which the meclizine is. OK to print rx if she prefers to do it this way. Usually cheapest at [place] but she can check with pharmacy as well for cash pay price.

## 2019-03-06 NOTE — Telephone Encounter (Signed)
Spoke with the pt and informed her of the message below.  Patient stated she talked with her insurance and was told Antivert 12.5mg  is covered because this generic.  Stated she paid cash for the last Rx for Meclizine.  Message sent to PCP.

## 2019-03-06 NOTE — Telephone Encounter (Signed)
I called the pt and informed her of the message below.  Patient is aware the Rx was sent to her pharmacy with a note attached to place on hold as she stated she just picked up the Rx for #100 tablets of Meclizine recently.

## 2019-03-06 NOTE — Telephone Encounter (Signed)
Ok; if they are going to cover the 12.5mg  then we can send that for her if she would like. OK to send rx for TID prn of the 12.5mg  meclizine with 90 tablets and 1 refill.

## 2019-04-02 ENCOUNTER — Other Ambulatory Visit: Payer: Self-pay | Admitting: *Deleted

## 2019-04-02 MED ORDER — APIXABAN 5 MG PO TABS: 5.0000 mg | ORAL_TABLET | Freq: Two times a day (BID) | ORAL | 5 refills | Status: DC

## 2019-04-02 NOTE — Telephone Encounter (Signed)
Prescription refill request for Eliquis received.  Last office visit: Selena [name] 12/18/2018 Scr: 0.79, 12/13/2018 Age: 84 y.o. Weight: 93.9 kg   Prescription refill sent.

## 2019-04-04 ENCOUNTER — Ambulatory Visit (INDEPENDENT_AMBULATORY_CARE_PROVIDER_SITE_OTHER): Payer: Medicare Other | Admitting: *Deleted

## 2019-04-04 DIAGNOSIS — I480 Paroxysmal atrial fibrillation: Secondary | ICD-10-CM | POA: Diagnosis not present

## 2019-04-04 LAB — CUP PACEART REMOTE DEVICE CHECK
Date Time Interrogation Session: 20210311114437
Implantable Pulse Generator Implant Date: 20200827

## 2019-04-05 NOTE — Progress Notes (Signed)
ILR Remote 

## 2019-04-08 DIAGNOSIS — M67961 Unspecified disorder of synovium and tendon, right lower leg: Secondary | ICD-10-CM | POA: Insufficient documentation

## 2019-04-08 DIAGNOSIS — M67979 Unspecified disorder of synovium and tendon, unspecified ankle and foot: Secondary | ICD-10-CM

## 2019-04-08 HISTORY — DX: Unspecified disorder of synovium and tendon, unspecified ankle and foot: M67.979

## 2019-04-08 HISTORY — DX: Unspecified disorder of synovium and tendon, right lower leg: M67.961

## 2019-04-22 ENCOUNTER — Encounter: Payer: Self-pay | Admitting: Physical Therapy

## 2019-04-22 ENCOUNTER — Ambulatory Visit: Payer: Medicare Other | Attending: Student | Admitting: Physical Therapy

## 2019-04-22 ENCOUNTER — Other Ambulatory Visit: Payer: Self-pay

## 2019-04-22 DIAGNOSIS — R600 Localized edema: Secondary | ICD-10-CM | POA: Insufficient documentation

## 2019-04-22 DIAGNOSIS — M25571 Pain in right ankle and joints of right foot: Secondary | ICD-10-CM | POA: Diagnosis present

## 2019-04-22 DIAGNOSIS — M6281 Muscle weakness (generalized): Secondary | ICD-10-CM | POA: Insufficient documentation

## 2019-04-22 DIAGNOSIS — R262 Difficulty in walking, not elsewhere classified: Secondary | ICD-10-CM | POA: Insufficient documentation

## 2019-04-22 NOTE — Therapy (Signed)
[place], [place], [address] Phone: [phone]   Fax:  [phone]  Physical Therapy Evaluation  Patient Details  Name: Selena [name] MRN: [id number] Date of Birth: June 25, 1935 Referring Provider (PT): Mechele Claude, [place]   Encounter Date: 04/22/2019  PT End of Session - 04/22/19 2021    Visit Number  1    Number of Visits  12    Date for PT Re-Evaluation  06/03/19    Authorization Type  [employer] Medicare-progress note by visit 10    PT Start Time  1625    PT Stop Time  1712    PT Time Calculation (min)  47 min    Activity Tolerance  Patient limited by pain    Behavior During Therapy  [facility] for tasks assessed/performed       Past Medical History:  Diagnosis Date  . Anemia    PMH  . Aneurysm (arteriovenous) of coronary vessels   . Atrial fibrillation ([place]) 01/01/2007  . BACK PAIN, UPPER 07/09/2008  . Breast cancer, stage 1 ([place])   . Bruises easily   . Cataract    history  . Complication of anesthesia   . Diverticulitis   . DIVERTICULOSIS, COLON 12/26/2006  . Essential hypertension 10/27/2017  . Follicular non-Hodgkin's lymphoma ([place])   . Goiter    multi-nodular  . Hx of colonoscopy 2009  . LIVER FUNCTION TESTS, ABNORMAL, HX OF 12/26/2007  . Memory loss 05/16/2007  . OSTEOARTHRITIS 12/26/2006   takes Diclofenac daily but has stopped for surgery  . PONV (postoperative nausea and vomiting)   . Skin cancer   . VERTIGO 09/07/2009  . Wears dentures   . Wears glasses   . Wears hearing aid    bilateral    Past Surgical History:  Procedure Laterality Date  . ABDOMINAL HYSTERECTOMY    . ABLATION OF DYSRHYTHMIC FOCUS  2009  . APPENDECTOMY    . AUGMENTATION MAMMAPLASTY Right 2013  . BREAST BIOPSY  2013  . BREAST RECONSTRUCTION  04/14/2011   Procedure: BREAST RECONSTRUCTION;  Surgeon: Crissie Reese, MD;  Location: [facility];  Service: Plastics;  Laterality: Right;  Placement of Right Breast  Tissue Expander with  use of Flex HD for Breast Reconstruction  . BREAST SURGERY     right sm, snbx  . CATARACT EXTRACTION W/ INTRAOCULAR LENS  IMPLANT, BILATERAL  2010   bilateral  . COLONOSCOPY    . COLONOSCOPY    . KNEE SURGERY  2004/2010   arthroscopic/ bilateral knee replacements  . LOOP RECORDER IMPLANT    . MASTECTOMY Right 2013  . PORT-A-CATH REMOVAL N/A 12/07/2016   Procedure: REMOVAL PORT-A-CATH;  Surgeon: Rolm Bookbinder, MD;  Location: [place];  Service: General;  Laterality: N/A;  . PORTACATH PLACEMENT Right 08/11/2014   Procedure: INSERTION PORT-A-CATH WITH ULTRASOUND;  Surgeon: Rolm Bookbinder, MD;  Location: [facility];  Service: General;  Laterality: Right;  . [place]   right  . tmi  1998  . TONSILLECTOMY      There were no vitals filed for this visit.   Subjective Assessment - 04/22/19 2010    Subjective  Pt. reports approximately 3 week history of right foot and ankle pain issues-no mechanism of injury noted and with approximately 1 year history of issues with ankle edema preceding. Primary pain is on dorsum of foot with pt. noting tendency of ankle to "give way" with walking with associated pain. Pt. currently wearing ASO brace  with arch support insert. She has used a cane for ambulation since a fall last May and intermittently uses RW.    Pertinent History  a-fib with implanted loop recorder, syncope, follicular lymphoma, breast CA s/p mastectomy 2013, aortic aneurysm    Limitations  Standing;Walking    Patient Stated Goals  Get rid of foot pain    Currently in Pain?  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)    Referring Provider (PT)  Mechele Claude, PA-C    Onset Date/Surgical Date  04/01/19    Hand Dominance  Right    Prior Therapy  for shoulder      Precautions   Precaution Comments  a  fib with implanted loop recorder, syncope, fall risk, aortic aneurysm      Restrictions   Weight Bearing Restrictions  No      Balance Screen   Has the patient fallen in the past 6 months  No      [place] residence    Living Arrangements  Alone    Type of [place] Access  --   1 step no rail   Home Layout  One level    [place] - 2 wheels   has Hurry cane     Prior Function   Level of Independence  Other (comment)   mod I with ambulation with cane vs. RW     Cognition   Overall Cognitive Status  Within Functional Limits for tasks assessed      Observation/Other Assessments   Observations  pes planus, diffuse edema bilateral ankles right>left    Focus on Therapeutic Outcomes (FOTO)   49% limited      Observation/Other Assessments-Edema    Edema  Circumferential      Circumferential Edema   Circumferential - Right  28.5   mid-malleolus   Circumferential - Left   27   mid-malleolus     ROM / Strength   AROM / PROM / Strength  AROM;PROM;Strength      AROM   AROM Assessment Site  Ankle    Right/Left Ankle  Right;Left    Right Ankle Dorsiflexion  -2   lacking 2 deg from neutral   Right Ankle Plantar Flexion  25    Right Ankle Inversion  24    Right Ankle Eversion  22    Left Ankle Dorsiflexion  1    Left Ankle Plantar Flexion  40    Left Ankle Inversion  35    Left Ankle Eversion  27      PROM   PROM Assessment Site  Ankle    Right/Left Ankle  Right    Right Ankle Dorsiflexion  0      Strength   Strength Assessment Site  Knee;Ankle    Right/Left Knee  Right;Left    Right Knee Flexion  5/5    Right Knee Extension  4+/5    Left Knee Flexion  5/5    Left Knee Extension  5/5    Right/Left Ankle  Right;Left    Right Ankle Dorsiflexion  4/5    Right Ankle Plantar Flexion  4/5    Right Ankle Inversion  4/5    Right Ankle Eversion  4/5    Left Ankle Dorsiflexion  4+/5    Left Ankle Plantar  Flexion  4+/5   supine only   Left Ankle Inversion  4+/5    Left Ankle Eversion  4/5      Palpation   Palpation comment  tender to palpation with edema dorsal aspect of right foot, tender to palpation posterior tibial tendon region right medial ankle as well as CF, ATF ligaments right lateral ankle      Ambulation/Gait   Gait Comments  Pt. ambulates mod I with "Hurry cane" with antalgic gait with right ASO brace                Objective measurements completed on examination: See above findings.      [place] Adult PT Treatment/Exercise - 04/22/19 0001      Exercises   Exercises  --   HEP instruction and brief practice-see chart copy of handout            PT Education - 04/22/19 2021    Education Details  symptom etiology, role of tibialis posterior in foot/arch support, HEP, POC    Person(s) Educated  Patient    Methods  Explanation;Demonstration;Tactile cues;Verbal cues;Handout    Comprehension  Returned demonstration;Verbalized understanding;Need further instruction;Verbal cues required;Tactile cues required          PT Long Term Goals - 04/22/19 2031      PT LONG TERM GOAL #1   Title  Independent with HEP    Baseline  needs HEP    Time  6    Period  Weeks    Status  New    Target Date  06/03/19      PT LONG TERM GOAL #2   Title  Improve FOTO outcome measure score to 34% or less impairment    Baseline  49% limited    Time  6    Period  Weeks    Status  New    Target Date  06/03/19      PT LONG TERM GOAL #3   Title  Increase right ankle DF at least 3-5 deg to improve foot/toe clearance with gait    Baseline  -2 deg from neutral    Time  6    Period  Weeks    Status  New    Target Date  06/03/19      PT LONG TERM GOAL #4   Title  Increase right ankle strength grossly 1/2 MMT grade or greater to improve ankle stability for decreased incidence of rigth ankle "giving way" with ambulation    Baseline  grossly 4/5    Time  6    Period  Weeks     Status  New    Target Date  06/03/19      PT LONG TERM GOAL #5   Title  Tolerate standing/ambulation periods at least 30-40 min for chores, cooking and grocery shopping with right ankle pain decreased >50% from baseline status    Time  6    Period  Weeks    Status  New    Target Date  06/03/19             Plan - 04/22/19 2023    Clinical Impression Statement  Pt. presents with right posterior tibial tendon dysfunction with ankle weakness, decreased ROM, and edema with contributing factors from pes planus. Pt. would benefit from PT to help relieve pain and improve tolerance and functional status for mobility.  Personal Factors and Comorbidities  Age;Comorbidity 3+    Comorbidities  lymphoma, a-fib, aortic aneurysm, syncope/fall history    Examination-Activity Limitations  Squat;Stairs;Locomotion Level    Examination-Participation Restrictions  Community Activity;Cleaning;Meal Prep;Shop    Stability/Clinical Decision Making  Evolving/Moderate complexity    Clinical Decision Making  Moderate    Rehab Potential  Good    PT Frequency  2x / week    PT Duration  6 weeks    PT Treatment/Interventions  Cryotherapy;ADLs/Self Care Home Management;Moist Heat;Iontophoresis 4mg /ml Dexamethasone;Gait training;Therapeutic exercise;Balance training;Stair training;Functional mobility training;Neuromuscular re-education;Therapeutic activities;Manual techniques;Patient/family education;Taping    PT Next Visit Plan  Sent message to cardiologist (Dr. Lovena Le) re: if OK for ionto with implanted loop recorder-if cleared trial ionto (otherwise hold), no [place] due to CA/lymphoma, ankle Theraband 4-way, stretch gastroc/soleus, pending tolerance try standing strengthening with heel raises/eccentrics, balance/proprioceptive challnges with Airex, rocker board, pending tolerance closed chain knee/hip strengthening    PT Home Exercise Plan  [id number]: longsitting gastroc stretch, Theraband ankle PF and IV, towel  scrunches, supine ankle alphabet with legs elevated for edema    Consulted and Agree with Plan of Care  Patient       Patient will benefit from skilled therapeutic intervention in order to improve the following deficits and impairments:  Pain, Impaired flexibility, Decreased strength, Decreased activity tolerance, Increased edema, Decreased range of motion, Decreased balance, Difficulty walking  Visit Diagnosis: Pain in right ankle and joints of right foot  Difficulty in walking, not elsewhere classified  Muscle weakness (generalized)  Localized edema     Problem List Patient Active Problem List   Diagnosis Date Noted  . Chronic diastolic CHF (congestive heart failure) ([place]) 10/16/2018  . History of atrioventricular nodal ablation 10/16/2018  . Thoracic aortic aneurysm ([place]) 10/16/2018  . Syncope 08/21/2018  . S/P ablation of atrial flutter 07/17/2018  . Upper airway cough syndrome 10/27/2017  . Essential hypertension 10/27/2017  . DOE (dyspnea on exertion) 10/26/2017  . Anxiety 09/10/2014  . Insomnia 09/10/2014  . Follicular 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 ([place]) 07/31/2014  . Paraspinal mass 07/16/2014  . Breast cancer of upper-outer quadrant of right female breast ([place]) 03/21/2011  . VERTIGO 09/07/2009  . Backache 07/09/2008  . FATIGUE 03/18/2008  . LFTs abnormal 12/26/2007  . MEMORY LOSS 05/16/2007  . ATRIAL FIBRILLATION 01/01/2007  . DIVERTICULOSIS, COLON 12/26/2006  . Osteoarthritis 12/26/2006    Beaulah Dinning, PT, DPT 04/22/19 8:37 PM  [facility] [address] [place], [place], [address] Phone: [phone]   Fax:  [phone]  Name: Ovella Nakada MRN: [id number] Date of Birth: 01-26-1935

## 2019-04-30 ENCOUNTER — Encounter: Payer: Self-pay | Admitting: Family Medicine

## 2019-04-30 ENCOUNTER — Other Ambulatory Visit: Payer: Self-pay

## 2019-04-30 ENCOUNTER — Telehealth: Payer: Self-pay | Admitting: Family Medicine

## 2019-04-30 ENCOUNTER — Telehealth (INDEPENDENT_AMBULATORY_CARE_PROVIDER_SITE_OTHER): Payer: Medicare Other | Admitting: Family Medicine

## 2019-04-30 DIAGNOSIS — R05 Cough: Secondary | ICD-10-CM | POA: Diagnosis not present

## 2019-04-30 DIAGNOSIS — R059 Cough, unspecified: Secondary | ICD-10-CM

## 2019-04-30 DIAGNOSIS — R0981 Nasal congestion: Secondary | ICD-10-CM | POA: Diagnosis not present

## 2019-04-30 MED ORDER — BENZONATATE 100 MG PO CAPS: 100.0000 mg | ORAL_CAPSULE | Freq: Two times a day (BID) | ORAL | 0 refills | Status: DC | PRN

## 2019-04-30 NOTE — Telephone Encounter (Signed)
Spoke with the pt and scheduled an appt for a phone visit with Dr Maudie Mercury at 10:20am.

## 2019-04-30 NOTE — Telephone Encounter (Signed)
Pt has sore throat, cough, runny nose, chills. She would like advice on what to do. Offered virtual but pt explained she does not have access to smart phone or computer.

## 2019-04-30 NOTE — Progress Notes (Signed)
Virtual Visit via Telephone Note  I connected with Selena [name] on 04/30/19 at 10:20 AM EDT by telephone and verified that I am speaking with the correct person using two identifiers.   I discussed the limitations, risks, security and privacy concerns of performing an evaluation and management service by telephone and the availability of in person appointments. I also discussed with the patient that there may be a patient responsible charge related to this service. The patient expressed understanding and agreed to proceed.  Location patient: home Location provider: work or home office Participants present for the call: patient, provider Patient did not have a visit in the prior 7 days to address this/these issue(s).   History of Present Illness:  Acute visit for upper resp symptoms: -started 2 days ago after being outside all day -symptoms includes clear runny nose, sneezing, drainage in throat, sore throat, cough, mild HA, some body aches, tired -denies: denies change in baseline SOB, loss of taste or smell, fever to her knowledge - reports does not feel like it, CP, diarrhea, vomiting -BP 140/80s -no known sick contacts, she was around her granddaughter this past weekend - granddaughter works at the hospital -pt does go to the grocery store rarely and is very careful and goes to many doctor visits and physical therapy for her ankle -reports she does not usually get allergy issues -she is fully vaccinated for COVID19, last shot > 2 weeks ago   Observations/Objective: Patient sounds cheerful and well on the phone. I do not appreciate any SOB. Speech and thought processing are grossly intact. Patient reported vitals:  Assessment and Plan:  Nasal congestion  Cough  -we discussed possible serious and likely etiologies, options for evaluation and workup, limitations of telemedicine visit vs in person visit, treatment, treatment risks and precautions. Pt prefers to treat via  telemedicine empirically rather then risking or undertaking an in person visit at this moment. Query allergic rhinitis given very high pollen counts the last few days, VURI, possible mild influenza, less likely but not impossible mild COVID19 (though fully vaccinated and reported is very careful). Discussed options for eval for flu/COVID with testing, empiric treatment flu, symptomatic care, alt diagnoses, return precautions, etc. Pt prefers to try home care for 24 hours with antihistamine, cough medication (sent, tessalon). Patient agrees to seek prompt in person care if worsening, new symptoms arise, or if is not improving with treatment.  Follow Up Instructions:   I did not refer this patient for an OV in the next 24 hours for this/these issue(s).  I discussed the assessment and treatment plan with the patient. The patient was provided an opportunity to ask questions and all were answered. The patient agreed with the plan and demonstrated an understanding of the instructions.   The patient was advised to call back or seek an in-person evaluation if the symptoms worsen or if the condition fails to improve as anticipated.  I provided 18 minutes of non-face-to-face time during this encounter.   Lucretia Kern, DO

## 2019-05-01 ENCOUNTER — Ambulatory Visit: Payer: Medicare Other | Admitting: Physical Therapy

## 2019-05-03 ENCOUNTER — Encounter: Payer: Self-pay | Admitting: Physical Therapy

## 2019-05-03 ENCOUNTER — Other Ambulatory Visit: Payer: Self-pay

## 2019-05-03 ENCOUNTER — Ambulatory Visit: Payer: Medicare Other | Attending: Student | Admitting: Physical Therapy

## 2019-05-03 DIAGNOSIS — R600 Localized edema: Secondary | ICD-10-CM | POA: Insufficient documentation

## 2019-05-03 DIAGNOSIS — M25571 Pain in right ankle and joints of right foot: Secondary | ICD-10-CM | POA: Insufficient documentation

## 2019-05-03 DIAGNOSIS — M6281 Muscle weakness (generalized): Secondary | ICD-10-CM | POA: Insufficient documentation

## 2019-05-03 DIAGNOSIS — R262 Difficulty in walking, not elsewhere classified: Secondary | ICD-10-CM | POA: Diagnosis present

## 2019-05-03 DIAGNOSIS — M25512 Pain in left shoulder: Secondary | ICD-10-CM | POA: Diagnosis present

## 2019-05-03 NOTE — Therapy (Addendum)
[place], [place], [address] Phone: [phone]   Fax:  [phone]  Physical Therapy Treatment/Discharge  Patient Details  Name: Selena [name] MRN: [id number] Date of Birth: 1935/05/23 Referring Provider (PT): Mechele Claude, [place]   Encounter Date: 05/03/2019  PT End of Session - 05/03/19 1154    Visit Number  2    Number of Visits  12    Date for PT Re-Evaluation  06/03/19    Authorization Type  [employer]-progress note by visit 10    PT Start Time  1145    PT Stop Time  1233    PT Time Calculation (min)  48 min    Activity Tolerance  Patient limited by pain    Behavior During Therapy  [facility] for tasks assessed/performed       Past Medical History:  Diagnosis Date  . Anemia    PMH  . Aneurysm (arteriovenous) of coronary vessels   . Atrial fibrillation ([place]) 01/01/2007  . BACK PAIN, UPPER 07/09/2008  . Breast cancer, stage 1 ([place])   . Bruises easily   . Cataract    history  . Complication of anesthesia   . Diverticulitis   . DIVERTICULOSIS, COLON 12/26/2006  . Essential hypertension 10/27/2017  . Follicular non-Hodgkin's lymphoma ([place])   . Goiter    multi-nodular  . Hx of colonoscopy 2009  . LIVER FUNCTION TESTS, ABNORMAL, HX OF 12/26/2007  . Memory loss 05/16/2007  . OSTEOARTHRITIS 12/26/2006   takes Diclofenac daily but has stopped for surgery  . PONV (postoperative nausea and vomiting)   . Skin cancer   . VERTIGO 09/07/2009  . Wears dentures   . Wears glasses   . Wears hearing aid    bilateral    Past Surgical History:  Procedure Laterality Date  . ABDOMINAL HYSTERECTOMY    . ABLATION OF DYSRHYTHMIC FOCUS  2009  . APPENDECTOMY    . AUGMENTATION MAMMAPLASTY Right 2013  . BREAST BIOPSY  2013  . BREAST RECONSTRUCTION  04/14/2011   Procedure: BREAST RECONSTRUCTION;  Surgeon: Crissie Reese, MD;  Location: [facility];  Service: Plastics;  Laterality: Right;  Placement of Right  Breast Tissue Expander with  use of Flex HD for Breast Reconstruction  . BREAST SURGERY     right sm, snbx  . CATARACT EXTRACTION W/ INTRAOCULAR LENS  IMPLANT, BILATERAL  2010   bilateral  . COLONOSCOPY    . COLONOSCOPY    . KNEE SURGERY  2004/2010   arthroscopic/ bilateral knee replacements  . LOOP RECORDER IMPLANT    . MASTECTOMY Right 2013  . PORT-A-CATH REMOVAL N/A 12/07/2016   Procedure: REMOVAL PORT-A-CATH;  Surgeon: Rolm Bookbinder, MD;  Location: [facility];  Service: General;  Laterality: N/A;  . PORTACATH PLACEMENT Right 08/11/2014   Procedure: INSERTION PORT-A-CATH WITH ULTRASOUND;  Surgeon: Rolm Bookbinder, MD;  Location: [facility];  Service: General;  Laterality: Right;  . St.    right  . tmi  1998  . TONSILLECTOMY      There were no vitals filed for this visit.  Subjective Assessment - 05/03/19 1152    Subjective  Pt arriving to therapy reporting no pain today. Pt reproting she has not had any episodes of her ankle "giving away" since her last visit.    Pertinent History  a-fib with implanted loop recorder, syncope, follicular lymphoma, breast CA s/p mastectomy 2013, aortic aneurysm    Limitations  Standing;Walking    Patient  Stated Goals  Get rid of foot pain    Currently in Pain?  No/denies                       [facility] Adult PT Treatment/Exercise - 05/03/19 0001      Exercises   Exercises  Ankle      Modalities   Modalities  Cryotherapy      Cryotherapy   Number Minutes Cryotherapy  8 Minutes    Cryotherapy Location  Ankle   right   Type of Cryotherapy  Ice pack      Manual Therapy   Manual Therapy  Passive ROM    Manual therapy comments  8 minutes    Passive ROM  DF/ PF/ Inversion/Eversion      Ankle Exercises: Aerobic   Recumbent Bike  L1 x 5 minutes      Ankle Exercises: Standing   Heel Raises  Both;10 reps    Toe Raise  10 reps    Other Standing Ankle Exercises  standing on Airex with intermittent UE support       Ankle Exercises: Seated   Towel Crunch  2 reps    Towel Inversion/Eversion  5 reps    BAPS  Sitting;Level 2;15 reps;Limitations    BAPS Weights (lbs)  both directions, pt with more fatigue noted counter clockwise direction and increased difficulty.     Other Seated Ankle Exercises  4 way ankle using red theraband x 15 reps each                  PT Long Term Goals - 04/22/19 2031      PT LONG TERM GOAL #1   Title  Independent with HEP    Baseline  needs HEP    Time  6    Period  Weeks    Status  New    Target Date  06/03/19      PT LONG TERM GOAL #2   Title  Improve FOTO outcome measure score to 34% or less impairment    Baseline  49% limited    Time  6    Period  Weeks    Status  New    Target Date  06/03/19      PT LONG TERM GOAL #3   Title  Increase right ankle DF at least 3-5 deg to improve foot/toe clearance with gait    Baseline  -2 deg from neutral    Time  6    Period  Weeks    Status  New    Target Date  06/03/19      PT LONG TERM GOAL #4   Title  Increase right ankle strength grossly 1/2 MMT grade or greater to improve ankle stability for decreased incidence of rigth ankle "giving way" with ambulation    Baseline  grossly 4/5    Time  6    Period  Weeks    Status  New    Target Date  06/03/19      PT LONG TERM GOAL #5   Title  Tolerate standing/ambulation periods at least 30-40 min for chores, cooking and grocery shopping with right ankle pain decreased >50% from baseline status    Time  6    Period  Weeks    Status  New    Target Date  06/03/19            Plan - 05/03/19 1156    Clinical Impression Statement    Pt arriving to therapy reporting no pain in R ankle. Pt did report her 2nd toe was tender on the toenail.  Pt reporting increase swelling in R medial ankle, when measured pt's R ankle was only 1 centimeter larger than left. Pt tolerating exericses well no reports of increased pain, pt reporting ankle fatigue and had difficulty  with BAPS board in counter clockwise direction. Progresing with more weight bearing activites Continue skilled PT.    Comorbidities  lymphoma, a-fib, aortic aneurysm, syncope/fall history    Examination-Activity Limitations  Squat;Stairs;Locomotion Level    Examination-Participation Restrictions  Community Activity;Cleaning;Meal Prep;Shop    Stability/Clinical Decision Making  Evolving/Moderate complexity    Rehab Potential  Good    PT Frequency  2x / week    PT Duration  6 weeks    PT Treatment/Interventions  Cryotherapy;ADLs/Self Care Home Management;Moist Heat;Iontophoresis 99m/ml Dexamethasone;Gait training;Therapeutic exercise;Balance training;Stair training;Functional mobility training;Neuromuscular re-education;Therapeutic activities;Manual techniques;Patient/family education;Taping    PT Next Visit Plan  Sent message to cardiologist (Dr. TLovena Le re: if OK for ionto with implanted loop recorder-if cleared trial ionto (otherwise hold), no [place]oreadue to CA/lymphoma, ankle Theraband 4-way, stretch gastroc/soleus, pending tolerance try standing strengthening with heel raises/eccentrics, balance/proprioceptive challnges with Airex, rocker board, pending tolerance closed chain knee/hip strengthening    Consulted and Agree with Plan of Care  Patient       Patient will benefit from skilled therapeutic intervention in order to improve the following deficits and impairments:  Pain, Impaired flexibility, Decreased strength, Decreased activity tolerance, Increased edema, Decreased range of motion, Decreased balance, Difficulty walking  Visit Diagnosis: Pain in right ankle and joints of right foot  Difficulty in walking, not elsewhere classified  Muscle weakness (generalized)  Localized edema  Acute pain of left shoulder     Problem List Patient Active Problem List   Diagnosis Date Noted  . Chronic diastolic CHF (congestive heart failure) ([place] 10/16/2018  . History of atrioventricular nodal  ablation 10/16/2018  . Thoracic aortic aneurysm ([facility] 10/16/2018  . Syncope 08/21/2018  . S/P ablation of atrial flutter 07/17/2018  . Upper airway cough syndrome 10/27/2017  . Essential hypertension 10/27/2017  . DOE (dyspnea on exertion) 10/26/2017  . Anxiety 09/10/2014  . Insomnia 09/10/2014  . Follicular 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 ([facility] 07/31/2014  . Paraspinal mass 07/16/2014  . Breast cancer of upper-outer quadrant of right female breast ([facility] 03/21/2011  . VERTIGO 09/07/2009  . Backache 07/09/2008  . FATIGUE 03/18/2008  . LFTs abnormal 12/26/2007  . MEMORY LOSS 05/16/2007  . ATRIAL FIBRILLATION 01/01/2007  . DIVERTICULOSIS, COLON 12/26/2006  . Osteoarthritis 12/26/2006    JOretha Caprice MPT 05/03/2019, 12:26 PM  [facility] [address]Phone: [phone]  Fax:  [phone] Name: RSarai JanuaryMRN: [id number] of Birth: [birth year]PHYSICAL THERAPY DISCHARGE SUMMARY  Visits from Start of Care: 2  Current functional level related to goals / functional outcomes: See above   Remaining deficits: See above   Education / Equipment: Anatomy of condition, POC, HEP, exercise form/rationale  Plan: Patient agrees to discharge.  Patient goals were not met. Patient is being discharged due to a change in medical status.  ?????     Jessica C. Hightower PT, DPT 05/28/19 2:38 PM

## 2019-05-06 ENCOUNTER — Inpatient Hospital Stay (HOSPITAL_COMMUNITY)
Admission: EM | Admit: 2019-05-06 | Discharge: 2019-05-10 | DRG: 378 | Disposition: A | Discharge status: Home / Self Care | Payer: Medicare Other | Attending: Family Medicine | Admitting: Family Medicine

## 2019-05-06 ENCOUNTER — Telehealth: Payer: Self-pay | Admitting: *Deleted

## 2019-05-06 ENCOUNTER — Ambulatory Visit (INDEPENDENT_AMBULATORY_CARE_PROVIDER_SITE_OTHER): Payer: Medicare Other | Admitting: *Deleted

## 2019-05-06 ENCOUNTER — Ambulatory Visit: Payer: Medicare Other | Admitting: Cardiovascular Disease

## 2019-05-06 ENCOUNTER — Other Ambulatory Visit: Payer: Self-pay

## 2019-05-06 ENCOUNTER — Ambulatory Visit (INDEPENDENT_AMBULATORY_CARE_PROVIDER_SITE_OTHER): Payer: Medicare Other | Admitting: Family Medicine

## 2019-05-06 ENCOUNTER — Encounter: Payer: Self-pay | Admitting: Family Medicine

## 2019-05-06 ENCOUNTER — Emergency Department (HOSPITAL_COMMUNITY): Payer: Medicare Other

## 2019-05-06 ENCOUNTER — Encounter (HOSPITAL_COMMUNITY): Payer: Self-pay

## 2019-05-06 VITALS — BP 140/82 | HR 93 | Temp 98.0°F | Resp 20 | Ht 67.0 in | Wt 206.1 lb

## 2019-05-06 DIAGNOSIS — R195 Other fecal abnormalities: Secondary | ICD-10-CM | POA: Diagnosis not present

## 2019-05-06 DIAGNOSIS — I480 Paroxysmal atrial fibrillation: Secondary | ICD-10-CM

## 2019-05-06 DIAGNOSIS — Z9011 Acquired absence of right breast and nipple: Secondary | ICD-10-CM

## 2019-05-06 DIAGNOSIS — Z9889 Other specified postprocedural states: Secondary | ICD-10-CM

## 2019-05-06 DIAGNOSIS — D62 Acute posthemorrhagic anemia: Secondary | ICD-10-CM | POA: Diagnosis present

## 2019-05-06 DIAGNOSIS — Z79899 Other long term (current) drug therapy: Secondary | ICD-10-CM

## 2019-05-06 DIAGNOSIS — I4819 Other persistent atrial fibrillation: Secondary | ICD-10-CM | POA: Diagnosis present

## 2019-05-06 DIAGNOSIS — K250 Acute gastric ulcer with hemorrhage: Secondary | ICD-10-CM | POA: Diagnosis not present

## 2019-05-06 DIAGNOSIS — R0789 Other chest pain: Secondary | ICD-10-CM

## 2019-05-06 DIAGNOSIS — I5032 Chronic diastolic (congestive) heart failure: Secondary | ICD-10-CM | POA: Diagnosis present

## 2019-05-06 DIAGNOSIS — I4821 Permanent atrial fibrillation: Secondary | ICD-10-CM | POA: Diagnosis present

## 2019-05-06 DIAGNOSIS — Z96653 Presence of artificial knee joint, bilateral: Secondary | ICD-10-CM | POA: Diagnosis present

## 2019-05-06 DIAGNOSIS — Z7901 Long term (current) use of anticoagulants: Secondary | ICD-10-CM

## 2019-05-06 DIAGNOSIS — K920 Hematemesis: Secondary | ICD-10-CM

## 2019-05-06 DIAGNOSIS — I4891 Unspecified atrial fibrillation: Secondary | ICD-10-CM | POA: Diagnosis present

## 2019-05-06 DIAGNOSIS — Z87891 Personal history of nicotine dependence: Secondary | ICD-10-CM

## 2019-05-06 DIAGNOSIS — R0600 Dyspnea, unspecified: Secondary | ICD-10-CM | POA: Diagnosis not present

## 2019-05-06 DIAGNOSIS — K449 Diaphragmatic hernia without obstruction or gangrene: Secondary | ICD-10-CM | POA: Diagnosis present

## 2019-05-06 DIAGNOSIS — K222 Esophageal obstruction: Secondary | ICD-10-CM | POA: Diagnosis present

## 2019-05-06 DIAGNOSIS — R799 Abnormal finding of blood chemistry, unspecified: Secondary | ICD-10-CM

## 2019-05-06 DIAGNOSIS — K922 Gastrointestinal hemorrhage, unspecified: Secondary | ICD-10-CM | POA: Diagnosis present

## 2019-05-06 DIAGNOSIS — Z20822 Contact with and (suspected) exposure to covid-19: Secondary | ICD-10-CM | POA: Diagnosis present

## 2019-05-06 DIAGNOSIS — Z853 Personal history of malignant neoplasm of breast: Secondary | ICD-10-CM

## 2019-05-06 DIAGNOSIS — Z90710 Acquired absence of both cervix and uterus: Secondary | ICD-10-CM

## 2019-05-06 DIAGNOSIS — K219 Gastro-esophageal reflux disease without esophagitis: Secondary | ICD-10-CM | POA: Diagnosis present

## 2019-05-06 DIAGNOSIS — M1990 Unspecified osteoarthritis, unspecified site: Secondary | ICD-10-CM | POA: Diagnosis present

## 2019-05-06 DIAGNOSIS — I951 Orthostatic hypotension: Secondary | ICD-10-CM | POA: Diagnosis present

## 2019-05-06 DIAGNOSIS — K921 Melena: Secondary | ICD-10-CM | POA: Diagnosis not present

## 2019-05-06 DIAGNOSIS — Z9221 Personal history of antineoplastic chemotherapy: Secondary | ICD-10-CM

## 2019-05-06 DIAGNOSIS — I10 Essential (primary) hypertension: Secondary | ICD-10-CM | POA: Diagnosis present

## 2019-05-06 DIAGNOSIS — C8290 Follicular lymphoma, unspecified, unspecified site: Secondary | ICD-10-CM | POA: Diagnosis present

## 2019-05-06 DIAGNOSIS — I5033 Acute on chronic diastolic (congestive) heart failure: Secondary | ICD-10-CM | POA: Diagnosis present

## 2019-05-06 DIAGNOSIS — Z791 Long term (current) use of non-steroidal anti-inflammatories (NSAID): Secondary | ICD-10-CM

## 2019-05-06 DIAGNOSIS — I110 Hypertensive heart disease with heart failure: Secondary | ICD-10-CM | POA: Diagnosis present

## 2019-05-06 DIAGNOSIS — E042 Nontoxic multinodular goiter: Secondary | ICD-10-CM | POA: Diagnosis present

## 2019-05-06 DIAGNOSIS — E872 Acidosis: Secondary | ICD-10-CM | POA: Diagnosis present

## 2019-05-06 LAB — COMPREHENSIVE METABOLIC PANEL
ALT: 23 U/L (ref 0–44)
AST: 21 U/L (ref 15–41)
Albumin: 4 g/dL (ref 3.5–5.0)
Alkaline Phosphatase: 72 U/L (ref 38–126)
Anion gap: 8 (ref 5–15)
BUN: 56 mg/dL — ABNORMAL HIGH (ref 8–23)
CO2: 25 mmol/L (ref 22–32)
Calcium: 9.1 mg/dL (ref 8.9–10.3)
Chloride: 110 mmol/L (ref 98–111)
Creatinine, Ser: 0.67 mg/dL (ref 0.44–1.00)
GFR calc Af Amer: >60 mL/min (ref >60)
GFR calc non Af Amer: >60 mL/min (ref >60)
Glucose, Bld: 100 mg/dL — ABNORMAL HIGH (ref 70–99)
Potassium: 5 mmol/L (ref 3.5–5.1)
Sodium: 143 mmol/L (ref 135–145)
Total Bilirubin: 0.8 mg/dL (ref 0.3–1.2)
Total Protein: 6.4 g/dL — ABNORMAL LOW (ref 6.5–8.1)

## 2019-05-06 LAB — CBC WITH DIFFERENTIAL/PLATELET
Abs Immature Granulocytes: 0.09 10*3/uL — ABNORMAL HIGH (ref 0.00–0.07)
Basophils Absolute: 0.1 10*3/uL (ref 0.0–0.1)
Basophils Relative: 1 %
Eosinophils Absolute: 0.1 10*3/uL (ref 0.0–0.5)
Eosinophils Relative: 1 %
HCT: 40.4 % (ref 36.0–46.0)
Hemoglobin: 13.2 g/dL (ref 12.0–15.0)
Immature Granulocytes: 1 %
Lymphocytes Relative: 16 %
Lymphs Abs: 1.9 10*3/uL (ref 0.7–4.0)
MCH: 32 pg (ref 26.0–34.0)
MCHC: 32.7 g/dL (ref 30.0–36.0)
MCV: 97.8 fL (ref 80.0–100.0)
Monocytes Absolute: 0.9 10*3/uL (ref 0.1–1.0)
Monocytes Relative: 7 %
Neutro Abs: 9.3 10*3/uL — ABNORMAL HIGH (ref 1.7–7.7)
Neutrophils Relative %: 74 %
Platelets: 199 10*3/uL (ref 150–400)
RBC: 4.13 MIL/uL (ref 3.87–5.11)
RDW: 12.9 % (ref 11.5–15.5)
WBC: 12.5 10*3/uL — ABNORMAL HIGH (ref 4.0–10.5)
nRBC: 0 % (ref 0.0–0.2)

## 2019-05-06 LAB — I-STAT CHEM 8, ED
BUN: 50 mg/dL — ABNORMAL HIGH (ref 8–23)
Calcium, Ion: 1.21 mmol/L (ref 1.15–1.40)
Chloride: 109 mmol/L (ref 98–111)
Creatinine, Ser: 0.7 mg/dL (ref 0.44–1.00)
Glucose, Bld: 99 mg/dL (ref 70–99)
HCT: 38 % (ref 36.0–46.0)
Hemoglobin: 12.9 g/dL (ref 12.0–15.0)
Potassium: 4.7 mmol/L (ref 3.5–5.1)
Sodium: 143 mmol/L (ref 135–145)
TCO2: 28 mmol/L (ref 22–32)

## 2019-05-06 LAB — TYPE AND SCREEN
ABO/RH(D): O POS
Antibody Screen: NEGATIVE

## 2019-05-06 LAB — CBC
HCT: 37.5 % (ref 36.0–46.0)
Hemoglobin: 12.2 g/dL (ref 12.0–15.0)
MCH: 32.2 pg (ref 26.0–34.0)
MCHC: 32.5 g/dL (ref 30.0–36.0)
MCV: 98.9 fL (ref 80.0–100.0)
Platelets: 181 10*3/uL (ref 150–400)
RBC: 3.79 MIL/uL — ABNORMAL LOW (ref 3.87–5.11)
RDW: 13 % (ref 11.5–15.5)
WBC: 14.2 10*3/uL — ABNORMAL HIGH (ref 4.0–10.5)
nRBC: 0 % (ref 0.0–0.2)

## 2019-05-06 LAB — LACTIC ACID, PLASMA: Lactic Acid, Venous: 2.3 mmol/L (ref 0.5–1.9)

## 2019-05-06 LAB — ABO/RH: ABO/RH(D): O POS

## 2019-05-06 LAB — PROTIME-INR
INR: 1.1 (ref 0.8–1.2)
Prothrombin Time: 14.2 seconds (ref 11.4–15.2)

## 2019-05-06 LAB — POC OCCULT BLOOD, ED: Fecal Occult Bld: POSITIVE — AB

## 2019-05-06 MED ORDER — MECLIZINE HCL 25 MG PO TABS
12.5000 mg | ORAL_TABLET | Freq: Three times a day (TID) | ORAL | Status: DC | PRN
  Administered 2019-05-07: 12.5 mg via ORAL
  Filled 2019-05-06: qty 1

## 2019-05-06 MED ORDER — PANTOPRAZOLE SODIUM 40 MG IV SOLR
40.0000 mg | Freq: Once | INTRAVENOUS | Status: AC
  Administered 2019-05-06: 40 mg via INTRAVENOUS
  Filled 2019-05-06: qty 40

## 2019-05-06 MED ORDER — SODIUM CHLORIDE 0.9 % IV SOLN
80.0000 mg | Freq: Once | INTRAVENOUS | Status: DC
  Filled 2019-05-06: qty 80

## 2019-05-06 MED ORDER — ACETAMINOPHEN 325 MG PO TABS
650.0000 mg | ORAL_TABLET | Freq: Four times a day (QID) | ORAL | Status: DC | PRN
  Administered 2019-05-06 – 2019-05-09 (×4): 650 mg via ORAL
  Filled 2019-05-06 (×4): qty 2

## 2019-05-06 MED ORDER — MORPHINE SULFATE (PF) 2 MG/ML IV SOLN
0.5000 mg | INTRAVENOUS | Status: DC | PRN
  Administered 2019-05-07 – 2019-05-10 (×3): 0.5 mg via INTRAVENOUS
  Filled 2019-05-06 (×3): qty 1

## 2019-05-06 MED ORDER — SODIUM CHLORIDE 0.9 % IV SOLN: INTRAVENOUS | Status: DC

## 2019-05-06 MED ORDER — PANTOPRAZOLE SODIUM 40 MG IV SOLR: 40.0000 mg | Freq: Two times a day (BID) | INTRAVENOUS | Status: DC

## 2019-05-06 MED ORDER — ACETAMINOPHEN 650 MG RE SUPP: 650.0000 mg | Freq: Four times a day (QID) | RECTAL | Status: DC | PRN

## 2019-05-06 MED ORDER — SODIUM CHLORIDE 0.9 % IV BOLUS: 500.0000 mL | Freq: Once | INTRAVENOUS | Status: DC

## 2019-05-06 MED ORDER — SODIUM CHLORIDE 0.9 % IV SOLN
8.0000 mg/h | INTRAVENOUS | Status: AC
  Administered 2019-05-08: 8 mg/h via INTRAVENOUS
  Filled 2019-05-06 (×3): qty 80

## 2019-05-06 MED ORDER — METOCLOPRAMIDE HCL 5 MG/ML IJ SOLN
10.0000 mg | Freq: Once | INTRAMUSCULAR | Status: AC
  Administered 2019-05-07: 10 mg via INTRAVENOUS
  Filled 2019-05-06: qty 2

## 2019-05-06 MED ORDER — ONDANSETRON HCL 4 MG/2ML IJ SOLN: 4.0000 mg | Freq: Four times a day (QID) | INTRAMUSCULAR | Status: DC | PRN

## 2019-05-06 MED ORDER — METOPROLOL TARTRATE 5 MG/5ML IV SOLN
5.0000 mg | Freq: Three times a day (TID) | INTRAVENOUS | Status: DC
  Administered 2019-05-06: 5 mg via INTRAVENOUS
  Filled 2019-05-06 (×2): qty 5

## 2019-05-06 MED ORDER — METOPROLOL TARTRATE 5 MG/5ML IV SOLN
5.0000 mg | Freq: Three times a day (TID) | INTRAVENOUS | Status: DC
  Administered 2019-05-06: 5 mg via INTRAVENOUS

## 2019-05-06 MED ORDER — ONDANSETRON HCL 4 MG PO TABS
4.0000 mg | ORAL_TABLET | Freq: Four times a day (QID) | ORAL | Status: DC | PRN
  Administered 2019-05-07: 4 mg via ORAL
  Filled 2019-05-06: qty 1

## 2019-05-06 MED ORDER — METOCLOPRAMIDE HCL 5 MG/ML IJ SOLN
10.0000 mg | Freq: Once | INTRAMUSCULAR | Status: AC
  Administered 2019-05-06: 10 mg via INTRAVENOUS
  Filled 2019-05-06: qty 2

## 2019-05-06 MED ORDER — SODIUM CHLORIDE 0.9 % IV SOLN
8.0000 mg/h | INTRAVENOUS | Status: DC
  Administered 2019-05-06: 8 mg/h via INTRAVENOUS
  Filled 2019-05-06: qty 80

## 2019-05-06 NOTE — H&P (View-Only) (Signed)
Referring Provider: No ref. provider found Primary Care Physician:  Caren Macadam, MD Primary Gastroenterologist:  Previously Dr. Deatra Ina with [facility]   Reason for Consultation:  Hematemesis/melena   HPI: Selena [name] is a 84 y.o. female with a past medical history of hypertension, CHF, atrial fibrillation on Eliquis, thoracic aneurysm,  breast cancer stage I s/p right mastectomy 3300,  follicular non-Hodgkin's lymphoma of intra-abdominal lymph nodes treated with Bendamustine and Rituxan in 2016.  She felt unwell yesterday evening, felt a fluttering in her chest. She vomited a cup full of "red red blood" x 1 episode. She sat up in her living room for a few hours then went back to bed. She awakened at 9am and she went to the bathroom and passed a moderate amount of tarry black stool x 1. She contacted her PCP Dr. Martinique and she was advised to go to the ED but she refused. She was seen in the office by Dr. Martinique and she again advised the patient to go to the ED. EMS was called and she was transported to [facility] ED. In the ED, her Hg was 13.2. Markedly elevated BUN 56. No further hematemesis or melena since arriving to the ED. She denies having any history of GERD, ulcers or past GI bleed. However, her medication list from her PCP indicate she takes Protonix 62m daily and Famotidine 228mbid. She takes Melxicam 7.70m27mne tab daily for the past 20 years. She had an ankle injury and she increased Meloxicam to one tab bid for 3 days taken on 4/8, 4/9 and 4/10 as instructed by her orthopedist. She takes Advil 200m50mor 2 tabs  4 or 5 times monthly for generalized aches and pains. She last took Eliquis at 9am on Sunday 4/11. zShe denies taking any Pepto bismol. No iron use. She typically passes  3 to 4 soft formed brown bowel movements daily. No rectal bleeding. She underwent a colonoscopy by Dr. KaplDeatra Ina2012 which showed questionable of segmental colitis from sigmoid to descending colon.  Severe  diverticulosis. Mother and maternal aunt with history of colon cancer. No current chest pain, fluttering or SOB. She received Covid 19 vaccine x 2 Feb. 2021. Her son, WaynPatrick Jupiter present.   ED course: In the ED she complained of shortness of breath with feeling palpitations and fluttering in her heart.  The cardiac monitor showed heart rate in the 110s in atrial fibrillation.  She received IV metoprolol.  Rectal exam done by the ER physician assistant showed melenic stool.  Hemoccult positive. She received Protonix 80mg43mloS  Labs: Sodium 143.  Potassium 5.0.  Glucose 100.  BUN 56 (baseline BUN 24).  Creatinine 0.67.  Calcium 9.1.  Alk phos 72.  Albumin 4.0.  AST 21.  ALT 23.  Total bili 0.8.  Lactic acid 2.3.  WBC 12.5.  Hemoglobin 13.2.  Hematocrit 40.4.  MCV 97.8.  Platelet 199. INR 1.1. SARS Corona virus 2 pending.   Labs 4/12 at 1338:  Hemoglobin 12.9.  Hematocrit 38.0.   Past Medical History:  Diagnosis Date  . Anemia    PMH  . Aneurysm (arteriovenous) of coronary vessels   . Atrial fibrillation ([name]) [place]8/2008  . BACK PAIN, UPPER 07/09/2008  . Breast cancer, stage 1 ([name]) [place] Bruises easily   . Cataract    history  . Complication of anesthesia   . Diverticulitis   . DIVERTICULOSIS, COLON 12/26/2006  . Essential hypertension 10/27/2017  . Follicular  non-Hodgkin's lymphoma ([place])   . Goiter    multi-nodular  . Hx of colonoscopy 2009  . LIVER FUNCTION TESTS, ABNORMAL, HX OF 12/26/2007  . Memory loss 05/16/2007  . OSTEOARTHRITIS 12/26/2006   takes Diclofenac daily but has stopped for surgery  . PONV (postoperative nausea and vomiting)   . Skin cancer   . VERTIGO 09/07/2009  . Wears dentures   . Wears glasses   . Wears hearing aid    bilateral    Past Surgical History:  Procedure Laterality Date  . ABDOMINAL HYSTERECTOMY    . ABLATION OF DYSRHYTHMIC FOCUS  2009  . APPENDECTOMY    . AUGMENTATION MAMMAPLASTY Right 2013  . BREAST BIOPSY  2013  . BREAST RECONSTRUCTION  04/14/2011    Procedure: BREAST RECONSTRUCTION;  Surgeon: Crissie Reese, MD;  Location: [place];  Service: Plastics;  Laterality: Right;  Placement of Right Breast Tissue Expander with  use of Flex HD for Breast Reconstruction  . BREAST SURGERY     right sm, snbx  . CATARACT EXTRACTION W/ INTRAOCULAR LENS  IMPLANT, BILATERAL  2010   bilateral  . COLONOSCOPY    . COLONOSCOPY    . KNEE SURGERY  2004/2010   arthroscopic/ bilateral knee replacements  . LOOP RECORDER IMPLANT    . MASTECTOMY Right 2013  . PORT-A-CATH REMOVAL N/A 12/07/2016   Procedure: REMOVAL PORT-A-CATH;  Surgeon: Rolm Bookbinder, MD;  Location: [facility];  Service: General;  Laterality: N/A;  . PORTACATH PLACEMENT Right 08/11/2014   Procedure: INSERTION PORT-A-CATH WITH ULTRASOUND;  Surgeon: Rolm Bookbinder, MD;  Location: [place];  Service: General;  Laterality: Right;  . [place]   right  . tmi  1998  . TONSILLECTOMY      Prior to Admission medications   Medication Sig Start Date End Date Taking? Authorizing Provider  amLODipine (NORVASC) 5 MG tablet TAKE 1 TABLET BY MOUTH DAILY 10/29/18   Burnell Blanks, MD  apixaban (ELIQUIS) 5 MG TABS tablet Eliquis 5 mg tablet    [provider]  ASPIRIN PO Take 81 mg by mouth daily.    [provider]  benzonatate (TESSALON PERLES) 100 MG capsule Take 1 capsule (100 mg total) by mouth 2 (two) times daily as needed. 04/30/19   Lucretia Kern, DO  doxycycline (VIBRAMYCIN) 100 MG capsule Take 100 mg by mouth 2 (two) times daily.    [provider]  famotidine (PEPCID) 20 MG tablet Take 20 mg by mouth 2 (two) times daily.    [provider]  furosemide (LASIX) 20 MG tablet Take 1 tablet (20 mg total) by mouth daily. 02/20/19   Burnell Blanks, MD  meclizine (ANTIVERT) 25 MG tablet TAKE 1 TABLET(25 MG) BY MOUTH EVERY 6 HOURS AS NEEDED FOR DIZZINESS 11/13/18   Koberlein, Steele Berg, MD  meloxicam (MOBIC) 7.5 MG tablet TAKE 1 TABLET(7.5 MG)  BY MOUTH DAILY 10/26/18   Koberlein, Steele Berg, MD  metoprolol tartrate (LOPRESSOR) 25 MG tablet Take 1 tablet (25 mg total) by mouth 3 (three) times daily. May take 1 extra tablet as needed for heart rate over 100 10/17/18   Lenze, Jennye Moccasin, PA-C  ondansetron (ZOFRAN) 4 MG tablet Take 1 tablet (4 mg total) by mouth every 8 (eight) hours as needed for nausea or vomiting. 08/23/16   Marletta Lor, MD  pantoprazole (PROTONIX) 40 MG tablet pantoprazole 40 mg tablet,delayed release    [provider]    Current Facility-Administered Medications  Medication Dose Route Frequency Provider Last Rate Last Admin  . 0.9 %  sodium chloride infusion   Intravenous Continuous Regalado, Belkys A, MD      . metoprolol tartrate (LOPRESSOR) injection 5 mg  5 mg Intravenous Q8H Regalado, Belkys A, MD      . pantoprazole (PROTONIX) 80 mg in sodium chloride 0.9 % 100 mL (0.8 mg/mL) infusion  8 mg/hr Intravenous Continuous Regalado, Belkys A, MD      . pantoprazole (PROTONIX) 80 mg in sodium chloride 0.9 % 100 mL IVPB  80 mg Intravenous Once Regalado, Belkys A, MD      . Derrill Memo ON 05/10/2019] pantoprazole (PROTONIX) injection 40 mg  40 mg Intravenous Q12H Regalado, Belkys A, MD      . sodium chloride 0.9 % bolus 500 mL  500 mL Intravenous Once Regalado, Belkys A, MD       Current Outpatient Medications  Medication Sig Dispense Refill  . amLODipine (NORVASC) 5 MG tablet TAKE 1 TABLET BY MOUTH DAILY 90 tablet 2  . apixaban (ELIQUIS) 5 MG TABS tablet Eliquis 5 mg tablet    . ASPIRIN PO Take 81 mg by mouth daily.    . benzonatate (TESSALON PERLES) 100 MG capsule Take 1 capsule (100 mg total) by mouth 2 (two) times daily as needed. 20 capsule 0  . doxycycline (VIBRAMYCIN) 100 MG capsule Take 100 mg by mouth 2 (two) times daily.    . famotidine (PEPCID) 20 MG tablet Take 20 mg by mouth 2 (two) times daily.    . furosemide (LASIX) 20 MG tablet Take 1 tablet (20 mg total) by mouth daily. 90 tablet 1  .  meclizine (ANTIVERT) 25 MG tablet TAKE 1 TABLET(25 MG) BY MOUTH EVERY 6 HOURS AS NEEDED FOR DIZZINESS 100 tablet 2  . meloxicam (MOBIC) 7.5 MG tablet TAKE 1 TABLET(7.5 MG) BY MOUTH DAILY 90 tablet 1  . metoprolol tartrate (LOPRESSOR) 25 MG tablet Take 1 tablet (25 mg total) by mouth 3 (three) times daily. May take 1 extra tablet as needed for heart rate over 100 270 tablet 3  . ondansetron (ZOFRAN) 4 MG tablet Take 1 tablet (4 mg total) by mouth every 8 (eight) hours as needed for nausea or vomiting. 20 tablet 0  . pantoprazole (PROTONIX) 40 MG tablet pantoprazole 40 mg tablet,delayed release      Allergies as of 05/06/2019 - Review Complete 05/06/2019  Allergen Reaction Noted  . Codeine phosphate Other (See Comments)   . Adhesive [tape]  02/14/2018  . Codeine Other (See Comments) 03/23/2011    Family History  Problem Relation Age of Onset  . Colon cancer Mother   . Cancer Mother        colon  . Colon cancer Maternal Aunt   . Cancer Maternal Uncle   . Prostate cancer Maternal Uncle   . Prostate cancer Maternal Uncle   . Other Maternal Uncle   . Cancer Sister        kidney  . Cancer Brother        lymph node  . Anesthesia problems Neg Hx   . Hypotension Neg Hx   . Malignant hyperthermia Neg Hx   . Pseudochol deficiency Neg Hx   . Breast cancer Neg Hx     Social History   Socioeconomic History  . Marital status: Widowed    Spouse name: Not on file  . Number of children: Not on file  . Years of education: Not on file  . Highest education  level: Not on file  Occupational History  . Not on file  Tobacco Use  . Smoking status: Former Smoker    Packs/day: 0.10    Years: 20.00    Pack years: 2.00    Types: Cigarettes    Quit date: 01/24/1978    Years since quitting: 41.3  . Smokeless tobacco: Never Used  Substance and Sexual Activity  . Alcohol use: No  . Drug use: No  . Sexual activity: Yes    Birth control/protection: Surgical  Other Topics Concern  . Not on file    Social History Narrative  . Not on file   Social Determinants of Health   Financial Resource Strain:   . Difficulty of Paying Living Expenses:   Food Insecurity:   . Worried About Charity fundraiser in the Last Year:   . Arboriculturist in the Last Year:   Transportation Needs:   . Film/video editor (Medical):   [name] Lack of Transportation (Non-Medical):   Physical Activity:   . Days of Exercise per Week:   . Minutes of Exercise per Session:   Stress:   . Feeling of Stress :   Social Connections:   . Frequency of Communication with Friends and Family:   . Frequency of Social Gatherings with Friends and Family:   . Attends Religious Services:   . Active Member of Clubs or Organizations:   . Attends Archivist Meetings:   [name] Marital Status:   Intimate Partner Violence:   . Fear of Current or Ex-Partner:   . Emotionally Abused:   [name] Physically Abused:   . Sexually Abused:     Review of Systems: See HPI, all other systems reviewed and are negative.   Physical Exam: Vital signs in last 24 hours: Temp:  [98 F (36.7 C)-98.4 F (36.9 C)] 98.4 F (36.9 C) (04/12 1310) Pulse Rate:  [57-110] 110 (04/12 1600) Resp:  [19-21] 20 (04/12 1600) BP: (117-144)/(75-95) 117/95 (04/12 1600) SpO2:  [96 %-97 %] 97 % (04/12 1600) Weight:  [93 kg-93.5 kg] 93 kg (04/12 1306)   General:  Alert, well developed 84 year old female in NAD.  Head:  Normocephalic and atraumatic. Eyes:  No scleral icterus. Conjunctiva pink. Ears:  Normal auditory acuity. Nose:  No deformity, discharge or lesions. Mouth:  Upper and lower dentures intact.  No ulcers or lesions.  Neck:  Supple. No lymphadenopathy or thyromegaly.  Lungs:  Breath sounds clear throughout.  Heart:  Irregular rhythm, no murmur.  Abdomen:  Soft, nondistended. Nontender. + BS x quads. Lower abdominal scar intact. No HSM. Rectal: Rectal exam done by the ER PA-C documented melenic stool, guaiac +.  Musculoskeletal:   Symmetrical without gross deformities.  Pulses:  Normal pulses noted. Extremities:  1+ LE edema.  Neurologic:  Alert and  oriented x4. No focal deficits.  Skin:  Intact without significant lesions or rashes. Psych:  Alert and cooperative. Normal mood and affect.  Intake/Output from previous day: No intake/output data recorded. Intake/Output this shift: Total I/O In: -  Out: 300 [Urine:300]  Lab Results: Recent Labs    05/06/19 1320 05/06/19 1338  WBC 12.5*  --   HGB 13.2 12.9  HCT 40.4 38.0  PLT 199  --    BMET Recent Labs    05/06/19 1320 05/06/19 1338  NA 143 143  K 5.0 4.7  CL 110 109  CO2 25  --   GLUCOSE 100* 99  BUN 56* 50*  CREATININE 0.67 0.70  CALCIUM 9.1  --    LFT Recent Labs    05/06/19 1320  PROT 6.4*  ALBUMIN 4.0  AST 21  ALT 23  ALKPHOS 72  BILITOT 0.8   PT/INR Recent Labs    05/06/19 1320  LABPROT 14.2  INR 1.1   Hepatitis Panel No results for input(s): HEPBSAG, HCVAB, HEPAIGM, HEPBIGM in the last 72 hours.    Studies/Results: DG ABD ACUTE 2+V W 1V CHEST  Result Date: 05/06/2019 CLINICAL DATA:  Vomiting and abdominal pain with GI bleeding EXAM: DG ABDOMEN ACUTE W/ 1V CHEST COMPARISON:  None. FINDINGS: Cardiac shadow is at the upper limits of normal in size. Loop recorder is noted aortic calcifications are seen. The lungs are mildly hyperinflated without focal infiltrate or sizable effusion. Scattered large and small bowel gas is noted. No abnormal mass or abnormal calcifications are seen. No free air is noted. Degenerative changes of lumbar spine are seen. IMPRESSION: No acute abnormality noted. Electronically Signed   By: Inez Catalina M.D.   On: 05/06/2019 14:29    IMPRESSION/PLAN:  38. 84 year old female with a history of atrial fibrillation on Eliquis + Meloxicam 7.9m QD x 20 years and last week took  Meloxicam bid x 3 days, occasional Advil use presents to the ED with UGI bleed/hematemesis and melena. Hg 13.2 (baseline Hg 14.6  on 12/13/2018). BUN 56  (baseline BUN 24). Received Protonix 839mIV load and 54m60mr infusion started in the ED.  -Check H/H Q 8 hours -Continue PPI infusion -Clear liquid diet  -NPO after midnight -EGD benefits and risks discussed including risk with sedation, risk of bleeding, perforation and infection  -Transfuse for Hg < 7.  -Reglan 75m71m at 8pm tonight and at 9am 4/13.   2. History of breast cancer  3. History of Non Hodgkin's lymphoma   Further recommendations per Dr. MansCorena PilgrimeDorathy Daft12/2021, 4:17 PM

## 2019-05-06 NOTE — Progress Notes (Signed)
ACUTE VISIT   HPI:  Chief Complaint  Patient presents with  . Melena    started this morning  . Fatigue  . Emesis    vomited up blood on yesterday    Selena [name] is a 84 y.o. female with history of hypertension,CHF, atrial fibrillation on chronic anticoagulation,thoracic aneurysm, anxiety, and exertional dyspnea who is here today complaining of episode of hematemesis yesterday with blood clots, around 11-12 mid night. She called earlier today and was strongly recommended going to the ER but she refused.  Today she has had melena and loose stools. + Nausea. Hx of diverticulosis.  She denies fever, chills, dysphagia, abdominal pain, vomiting, red blood in stool, or urinary symptoms. Dyspnea at rest and chest tightness that started yesterday. She took an extra metoprolol tartrate and SOB+ CP improved "a little." Negative for orthopnea, PND, or worsening edema.  She has had black stools in the past, thought it was Pepto-Bismol, it resolved.   She has not taking any OTC medication.  She takes Protonix 40 mg daily and famotidine 20 mg twice daily.  Lab Results  Component Value Date   CREATININE 0.79 12/13/2018   BUN 24 (H) 12/13/2018   NA 144 12/13/2018   K 4.5 12/13/2018   CL 104 12/13/2018   CO2 31 12/13/2018   Lab Results  Component Value Date   WBC 8.1 12/13/2018   HGB 14.6 12/13/2018   HCT 44.2 12/13/2018   MCV 96.1 12/13/2018   PLT 173 12/13/2018   She takes Meloxicam daily prn. Completing abx treatment, Doxycycline, reporting a recent "cold", cough has improved. Not longer on Aspirin.  Review of Systems  Constitutional: Positive for appetite change and fatigue.  HENT: Negative for mouth sores, nosebleeds and sore throat.   Eyes: Negative for redness and visual disturbance.  Respiratory: Negative for wheezing and stridor.   Cardiovascular: Positive for palpitations.  Gastrointestinal: Positive for diarrhea and nausea. Negative for  abdominal distention.  Genitourinary: Negative for decreased urine volume, dysuria and hematuria.  Musculoskeletal: Positive for gait problem. Negative for myalgias.  Skin: Negative for rash and wound.  Neurological: Negative for syncope, weakness and headaches.  Psychiatric/Behavioral: Negative for confusion and hallucinations. The patient is nervous/anxious.   Rest see pertinent positives and negatives per HPI.  Current Outpatient Medications on File Prior to Visit  Medication Sig Dispense Refill  . amLODipine (NORVASC) 5 MG tablet TAKE 1 TABLET BY MOUTH DAILY 90 tablet 2  . apixaban (ELIQUIS) 5 MG TABS tablet Eliquis 5 mg tablet    . ASPIRIN PO Take 81 mg by mouth daily.    . benzonatate (TESSALON PERLES) 100 MG capsule Take 1 capsule (100 mg total) by mouth 2 (two) times daily as needed. 20 capsule 0  . doxycycline (VIBRAMYCIN) 100 MG capsule Take 100 mg by mouth 2 (two) times daily.    . famotidine (PEPCID) 20 MG tablet Take 20 mg by mouth 2 (two) times daily.    . furosemide (LASIX) 20 MG tablet Take 1 tablet (20 mg total) by mouth daily. 90 tablet 1  . meclizine (ANTIVERT) 25 MG tablet TAKE 1 TABLET(25 MG) BY MOUTH EVERY 6 HOURS AS NEEDED FOR DIZZINESS 100 tablet 2  . meloxicam (MOBIC) 7.5 MG tablet TAKE 1 TABLET(7.5 MG) BY MOUTH DAILY 90 tablet 1  . metoprolol tartrate (LOPRESSOR) 25 MG tablet Take 1 tablet (25 mg total) by mouth 3 (three) times daily. May take 1 extra tablet as needed for heart  rate over 100 270 tablet 3  . ondansetron (ZOFRAN) 4 MG tablet Take 1 tablet (4 mg total) by mouth every 8 (eight) hours as needed for nausea or vomiting. 20 tablet 0  . pantoprazole (PROTONIX) 40 MG tablet pantoprazole 40 mg tablet,delayed release     No current facility-administered medications on file prior to visit.   Past Medical History:  Diagnosis Date  . Anemia    PMH  . Aneurysm (arteriovenous) of coronary vessels   . Atrial fibrillation ([place]) 01/01/2007  . BACK PAIN, UPPER  07/09/2008  . Breast cancer, stage 1 ([place])   . Bruises easily   . Cataract    history  . Complication of anesthesia   . Diverticulitis   . DIVERTICULOSIS, COLON 12/26/2006  . Essential hypertension 10/27/2017  . Follicular non-Hodgkin's lymphoma ([place])   . Goiter    multi-nodular  . Hx of colonoscopy 2009  . LIVER FUNCTION TESTS, ABNORMAL, HX OF 12/26/2007  . Memory loss 05/16/2007  . OSTEOARTHRITIS 12/26/2006   takes Diclofenac daily but has stopped for surgery  . PONV (postoperative nausea and vomiting)   . Skin cancer   . VERTIGO 09/07/2009  . Wears dentures   . Wears glasses   . Wears hearing aid    bilateral   Allergies  Allergen Reactions  . Codeine Phosphate Other (See Comments)    Hallucinations  . Adhesive [Tape]     Electrodes for EKG- skin blistering, erythema  . Codeine Other (See Comments)    Hallucinations  Other reaction(s): Other    Social History   Socioeconomic History  . Marital status: Widowed    Spouse name: Not on file  . Number of children: Not on file  . Years of education: Not on file  . Highest education level: Not on file  Occupational History  . Not on file  Tobacco Use  . Smoking status: Former Smoker    Packs/day: 0.10    Years: 20.00    Pack years: 2.00    Types: Cigarettes    Quit date: 01/24/1978    Years since quitting: 41.3  . Smokeless tobacco: Never Used  Substance and Sexual Activity  . Alcohol use: No  . Drug use: No  . Sexual activity: Yes    Birth control/protection: Surgical  Other Topics Concern  . Not on file  Social History Narrative  . Not on file   Social Determinants of Health   Financial Resource Strain:   . Difficulty of Paying Living Expenses:   Food Insecurity:   . Worried About Charity fundraiser in the Last Year:   . Arboriculturist in the Last Year:   Transportation Needs:   . Film/video editor (Medical):   [name] Lack of Transportation (Non-Medical):   Physical Activity:   . Days of Exercise per  Week:   . Minutes of Exercise per Session:   Stress:   . Feeling of Stress :   Social Connections:   . Frequency of Communication with Friends and Family:   . Frequency of Social Gatherings with Friends and Family:   . Attends Religious Services:   . Active Member of Clubs or Organizations:   . Attends Archivist Meetings:   [name] Marital Status:     Vitals:   05/06/19 1034  BP: 140/82  Pulse: 93  Resp: 20  Temp: 98 F (36.7 C)  SpO2: 97%   Body mass index is 32.28 kg/m.  Physical Exam  Nursing  note and vitals reviewed. Constitutional: She is oriented to person, place, and time. She appears well-developed. She appears distressed (She seems mildly dyspneic when talking.).  HENT:  Head: Normocephalic and atraumatic.  Mouth/Throat: Oropharynx is clear and moist and mucous membranes are normal. She has dentures.  Eyes: Pupils are equal, round, and reactive to light. Conjunctivae are normal.  Cardiovascular: Normal rate. An irregular rhythm present.  No murmur heard. Pulses:      Dorsalis pedis pulses are 2+ on the right side and 2+ on the left side.  Respiratory: Effort normal and breath sounds normal. No respiratory distress.  GI: Soft. She exhibits no mass. There is no hepatomegaly. There is abdominal tenderness in the epigastric area and left lower quadrant. There is guarding. There is no rebound.  Genitourinary:    Genitourinary Comments: Rectal examination deferred to ER evaluation.   Musculoskeletal:        General: No edema.  Neurological: She is alert and oriented to person, place, and time. She has normal strength. No cranial nerve deficit. Gait normal.  Skin: Skin is warm. No rash noted. No erythema. There is pallor.  Psychiatric: She has a normal mood and affect.  Well groomed, good eye contact.    ASSESSMENT AND PLAN:  Selena [name] was seen today for melena, fatigue and emesis.  Diagnoses and all orders for this visit:  Gastrointestinal hemorrhage with  hematemesis Initially reluctant to go to the ER, we discussed possible serious complications acute anemia as well as decompensation of chronic illnesses. Hemodynamically stable. Recommend discontinuing Meloxicam. Colonoscopy 05/2010 question of segmental colitis from sigmoid to descending colon.  Severe diverticulosis.  Continue Protonix and Famotidine.  She was referred to the ER via EMS.  Dyspnea, unspecified type We discussed possible etiologies, some of her chronic medical problems can be contributing factors.  Problem could be aggravated by severe anemia.  -     EKG 12-Lead  Paroxysmal atrial fibrillation ([name]) Problem is not rhythm nor rate control. We discussed benefits as well as side effects of chronic anticoagulation + risk of CVA with atrial fib if Eliquis is discontinued.  If problem becomes recurrent, she may need to have a conversation with her cardiologist about decreasing or discontinuing Eliquis based on risk versus benefit.  Chest tightness Given her CV hx, ACS needs to be considered. EKG today no significant changes when compared with prior one except for tachycardia.   -     EKG 12-Lead   Return for After ER visit.[name]   Travante Knee G. Martinique, MD  [facility]. [place] office.

## 2019-05-06 NOTE — ED Triage Notes (Signed)
84 yo female BIBA from PCP's office. Pt was seen for BRB emesis at 11pm last night X1 episode with chest tightness that resolved after she vomited. Denied SHOB. 12 lead showed Afib with hx of Afib. This morning at 9am pt had 1 episode of dark tarry stools. Denies trauma or hx of GIB. Pt is on Eliquis for Afib. C/O LUQ abd pain with palpation.

## 2019-05-06 NOTE — ED Provider Notes (Signed)
Medical screening examination/treatment/procedure(s) were conducted as a shared visit with non-physician practitioner(s) and myself.  I personally evaluated the patient during the encounter. Briefly, the patient is a 84 y.o. female with history of A. fib on Eliquis who presents to the ED with black stools.  Patient with episode of blood in her vomit last night.  Black stool this morning.  No history of GI bleeds.  No abdominal pain currently.  No shortness of breath.  Vital signs overall unremarkable.  Hemoccult is positive and patient with melena on exam per my physician assistant.  Suspect GI bleed.  Will check lab work.  Does not appear to need emergent transfusion at this time.  Anticipate admission and GI consultation.  Will transfuse blood if any changes in hemodynamics.  No specific abdominal pain at this time but will check acute abdominal series.  This chart was dictated using voice recognition software.  Despite best efforts to proofread,  errors can occur which can change the documentation meaning.    EKG shows atrial fibrillation.  Rate controlled.     Lennice Sites, DO 05/06/19 1344

## 2019-05-06 NOTE — ED Notes (Addendum)
Date and time results received: 05/06/19 2:50 PM  (use smartphrase ".now" to insert current time)  Test: Lactic Critical Value: 2.3  Name of Provider Notified: Raye Sorrow PA  Orders Received? Or Actions Taken?: Orders Received - See Orders for details

## 2019-05-06 NOTE — H&P (Signed)
History and Physical  Pyper Dages [id number] DOB: Mar 26, 1935 DOA: 05/06/2019  PCP: Caren Macadam, MD Patient coming from: Home  I have personally briefly reviewed patient's old medical records in [place]   Chief Complaint: Vomiting blood  HPI: Selena [name] is a 84 y.o. female with PMH significant for A fib on eliquis, HTN, diverticulosis, follicular non hodgkin lymphoma who presents complaining of vomiting blood bright red, 1 cup full of blood Sunday night, day prior to admission.  She also report one black tarry stool the morning of admission.  She last took Eliquis Sunday morning.  She has been taking meloxicam, recently taking 2 pills since 3 days prior to admission after she injured her ankle. She is currently feeling short of breath and feeling palpitation, fluttering in her heart.  On the monitor heart rate is in the 110s A. Fib.  I have ordered her IV metoprolol. She feels that she can use the bedpan, to have a bowel movement.  Evaluation in the ED: Sodium 143, potassium 4.7, BUN 50, creatinine 0.7, hemoglobin 12, potassium 2.3, INR 1.1 Occult blood positive.  KUB negative.  Review of Systems: All systems reviewed and apart from history of presenting illness, are negative.  Past Medical History:  Diagnosis Date  . Anemia    PMH  . Aneurysm (arteriovenous) of coronary vessels   . Atrial fibrillation ([place]) 01/01/2007  . BACK PAIN, UPPER 07/09/2008  . Breast cancer, stage 1 ([place])   . Bruises easily   . Cataract    history  . Complication of anesthesia   . Diverticulitis   . DIVERTICULOSIS, COLON 12/26/2006  . Essential hypertension 10/27/2017  . Follicular non-Hodgkin's lymphoma ([place])   . Goiter    multi-nodular  . Hx of colonoscopy 2009  . LIVER FUNCTION TESTS, ABNORMAL, HX OF 12/26/2007  . Memory loss 05/16/2007  . OSTEOARTHRITIS 12/26/2006   takes Diclofenac daily but has stopped for surgery  . PONV (postoperative nausea and vomiting)     . Skin cancer   . VERTIGO 09/07/2009  . Wears dentures   . Wears glasses   . Wears hearing aid    bilateral   Past Surgical History:  Procedure Laterality Date  . ABDOMINAL HYSTERECTOMY    . ABLATION OF DYSRHYTHMIC FOCUS  2009  . APPENDECTOMY    . AUGMENTATION MAMMAPLASTY Right 2013  . BREAST BIOPSY  2013  . BREAST RECONSTRUCTION  04/14/2011   Procedure: BREAST RECONSTRUCTION;  Surgeon: Crissie Reese, MD;  Location: [facility];  Service: Plastics;  Laterality: Right;  Placement of Right Breast Tissue Expander with  use of Flex HD for Breast Reconstruction  . BREAST SURGERY     right sm, snbx  . CATARACT EXTRACTION W/ INTRAOCULAR LENS  IMPLANT, BILATERAL  2010   bilateral  . COLONOSCOPY    . COLONOSCOPY    . KNEE SURGERY  2004/2010   arthroscopic/ bilateral knee replacements  . LOOP RECORDER IMPLANT    . MASTECTOMY Right 2013  . PORT-A-CATH REMOVAL N/A 12/07/2016   Procedure: REMOVAL PORT-A-CATH;  Surgeon: Rolm Bookbinder, MD;  Location: [facility];  Service: General;  Laterality: N/A;  . PORTACATH PLACEMENT Right 08/11/2014   Procedure: INSERTION PORT-A-CATH WITH ULTRASOUND;  Surgeon: Rolm Bookbinder, MD;  Location: [facility];  Service: General;  Laterality: Right;  . [place]   right  . tmi  1998  . TONSILLECTOMY     Social History:  reports that she quit smoking  about 41 years ago. Her smoking use included cigarettes. She has a 2.00 pack-year smoking history. She has never used smokeless tobacco. She reports that she does not drink alcohol or use drugs.   Allergies  Allergen Reactions  . Codeine Phosphate Other (See Comments)    Hallucinations  . Adhesive [Tape]     Electrodes for EKG- skin blistering, erythema  . Codeine Other (See Comments)    Hallucinations  Other reaction(s): Other    Family History  Problem Relation Age of Onset  . Colon cancer Mother   . Cancer Mother        colon  . Colon cancer Maternal Aunt   . Cancer Maternal Uncle   .  Prostate cancer Maternal Uncle   . Prostate cancer Maternal Uncle   . Other Maternal Uncle   . Cancer Sister        kidney  . Cancer Brother        lymph node  . Anesthesia problems Neg Hx   . Hypotension Neg Hx   . Malignant hyperthermia Neg Hx   . Pseudochol deficiency Neg Hx   . Breast cancer Neg Hx     Prior to Admission medications   Medication Sig Start Date End Date Taking? Authorizing Provider  amLODipine (NORVASC) 5 MG tablet TAKE 1 TABLET BY MOUTH DAILY 10/29/18   Burnell Blanks, MD  apixaban (ELIQUIS) 5 MG TABS tablet Eliquis 5 mg tablet    [provider]  ASPIRIN PO Take 81 mg by mouth daily.    [provider]  benzonatate (TESSALON PERLES) 100 MG capsule Take 1 capsule (100 mg total) by mouth 2 (two) times daily as needed. 04/30/19   Lucretia Kern, DO  doxycycline (VIBRAMYCIN) 100 MG capsule Take 100 mg by mouth 2 (two) times daily.    [provider]  famotidine (PEPCID) 20 MG tablet Take 20 mg by mouth 2 (two) times daily.    [provider]  furosemide (LASIX) 20 MG tablet Take 1 tablet (20 mg total) by mouth daily. 02/20/19   Burnell Blanks, MD  meclizine (ANTIVERT) 25 MG tablet TAKE 1 TABLET(25 MG) BY MOUTH EVERY 6 HOURS AS NEEDED FOR DIZZINESS 11/13/18   Koberlein, Steele Berg, MD  meloxicam (MOBIC) 7.5 MG tablet TAKE 1 TABLET(7.5 MG) BY MOUTH DAILY 10/26/18   Koberlein, Steele Berg, MD  metoprolol tartrate (LOPRESSOR) 25 MG tablet Take 1 tablet (25 mg total) by mouth 3 (three) times daily. May take 1 extra tablet as needed for heart rate over 100 10/17/18   Imogene Burn, PA-C  ondansetron (ZOFRAN) 4 MG tablet Take 1 tablet (4 mg total) by mouth every 8 (eight) hours as needed for nausea or vomiting. 08/23/16   Marletta Lor, MD  pantoprazole (PROTONIX) 40 MG tablet pantoprazole 40 mg tablet,delayed release    [provider]   Physical Exam: Vitals:   05/06/19 1310 05/06/19 1345 05/06/19 1400 05/06/19  1600  BP: 127/78 137/78 (!) 144/75 (!) 117/95  Pulse: 91 (!) 57 92 (!) 110  Resp: 20 (!) 21 19 20   Temp: 98.4 F (36.9 C)     SpO2: 97% 97% 97% 97%  Weight:      Height:         General exam: Moderately built and nourished patient, lying comfortably supine on the gurney in mild  distress.  Head, eyes and ENT: Nontraumatic and normocephalic. Pupils equally reacting to light and accommodation. Oral mucosa moist.  Neck:  Supple. No JVD, carotid bruit or thyromegaly.  Lymphatics: No lymphadenopathy.  Respiratory system: Clear to auscultation. No increased work of breathing.  Cardiovascular system: S1 and S2 heard, IRR.  Tachycardic  Gastrointestinal system: Abdomen is nondistended, soft and nontender. Normal bowel sounds heard. No organomegaly or masses appreciated.  Central nervous system: Alert and oriented. No focal neurological deficits.  Extremities: Symmetric 5 x 5 power. Peripheral pulses symmetrically felt.   Skin: No rashes or acute findings.  Musculoskeletal system: Negative exam.  Psychiatry: Pleasant and cooperative.   Labs on Admission:  Basic Metabolic Panel: Recent Labs  Lab 05/06/19 1320 05/06/19 1338  NA 143 143  K 5.0 4.7  CL 110 109  CO2 25  --   GLUCOSE 100* 99  BUN 56* 50*  CREATININE 0.67 0.70  CALCIUM 9.1  --    Liver Function Tests: Recent Labs  Lab 05/06/19 1320  AST 21  ALT 23  ALKPHOS 72  BILITOT 0.8  PROT 6.4*  ALBUMIN 4.0   No results for input(s): LIPASE, AMYLASE in the last 168 hours. No results for input(s): AMMONIA in the last 168 hours. CBC: Recent Labs  Lab 05/06/19 1320 05/06/19 1338  WBC 12.5*  --   NEUTROABS 9.3*  --   HGB 13.2 12.9  HCT 40.4 38.0  MCV 97.8  --   PLT 199  --    Cardiac Enzymes: No results for input(s): CKTOTAL, CKMB, CKMBINDEX, TROPONINI in the last 168 hours.  BNP (last 3 results) Recent Labs    07/17/18 1319  PROBNP 720   CBG: No results for input(s): GLUCAP in the last 168  hours.  Radiological Exams on Admission: DG ABD ACUTE 2+V W 1V CHEST  Result Date: 05/06/2019 CLINICAL DATA:  Vomiting and abdominal pain with GI bleeding EXAM: DG ABDOMEN ACUTE W/ 1V CHEST COMPARISON:  None. FINDINGS: Cardiac shadow is at the upper limits of normal in size. Loop recorder is noted aortic calcifications are seen. The lungs are mildly hyperinflated without focal infiltrate or sizable effusion. Scattered large and small bowel gas is noted. No abnormal mass or abnormal calcifications are seen. No free air is noted. Degenerative changes of lumbar spine are seen. IMPRESSION: No acute abnormality noted. Electronically Signed   By: Inez Catalina M.D.   On: 05/06/2019 14:29    EKG: Independently reviewed.  A. fib on the monitor  Assessment/Plan Active Problems:   ATRIAL FIBRILLATION   Osteoarthritis   Essential hypertension   Chronic diastolic CHF (congestive heart failure) ([name])   History of atrioventricular nodal ablation   GI bleed   1-GI bleed, likely upper GI bleed: Patient presented with hematemesis and melena.  BUN elevated. Patient high risk for bleeding, due to being on Eliquis. Hold anticoagulation. Hemoglobin at 12.  Continue to monitor CBC every 8 hours.  Transfusion as needed. IV fluids. IV Protonix drip. GI consulted.  2-A. fib with RVR: Report mild shortness of breath and fluttering her heart. IV metoprolol ordered. IV fluids, IV bolus.  Hold Eliquis.  3-Chronic diastolic heart failure: Appears compensated, chest x-ray negative for pulmonary edema. No hypoxemia.  Continue to monitor.  4-hypertension: Hold Norvasc in the setting of GI bleed. 5-arthritis: Hold meloxicam due to GI bleed.  Tylenol as needed for pain.     DVT Prophylaxis: SCDs Code Status: Partial Code,  okay to chest compressions, defibrillation, medication, no intubation Family Communication: Son at bedside Disposition Plan: Admit for further observation of GI bleed, cycle hemoglobin,  control A. fib.  Time spent: 75 minutes,   Elmarie Shiley MD [facility]   05/06/2019, 4:07 PM

## 2019-05-06 NOTE — Consult Note (Signed)
Referring Provider: No ref. provider found Primary Care Physician:  Caren Macadam, MD Primary Gastroenterologist:  Previously Dr. Deatra Ina with [facility]   Reason for Consultation:  Hematemesis/melena   HPI: Selena [name] is a 84 y.o. female with a past medical history of hypertension, CHF, atrial fibrillation on Eliquis, thoracic aneurysm,  breast cancer stage I s/p right mastectomy 7494,  follicular non-Hodgkin's lymphoma of intra-abdominal lymph nodes treated with Bendamustine and Rituxan in 2016.  She felt unwell yesterday evening, felt a fluttering in her chest. She vomited a cup full of "red red blood" x 1 episode. She sat up in her living room for a few hours then went back to bed. She awakened at 9am and she went to the bathroom and passed a moderate amount of tarry black stool x 1. She contacted her PCP Dr. Martinique and she was advised to go to the ED but she refused. She was seen in the office by Dr. Martinique and she again advised the patient to go to the ED. EMS was called and she was transported to [facility] ED. In the ED, her Hg was 13.2. Markedly elevated BUN 56. No further hematemesis or melena since arriving to the ED. She denies having any history of GERD, ulcers or past GI bleed. However, her medication list from her PCP indicate she takes Protonix 37m daily and Famotidine 251mbid. She takes Melxicam 7.65m64mne tab daily for the past 20 years. She had an ankle injury and she increased Meloxicam to one tab bid for 3 days taken on 4/8, 4/9 and 4/10 as instructed by her orthopedist. She takes Advil 200m23mor 2 tabs  4 or 5 times monthly for generalized aches and pains. She last took Eliquis at 9am on Sunday 4/11. zShe denies taking any Pepto bismol. No iron use. She typically passes  3 to 4 soft formed brown bowel movements daily. No rectal bleeding. She underwent a colonoscopy by Dr. KaplDeatra Ina2012 which showed questionable of segmental colitis from sigmoid to descending colon.  Severe  diverticulosis. Mother and maternal aunt with history of colon cancer. No current chest pain, fluttering or SOB. She received Covid 19 vaccine x 2 Feb. 2021. Her son, WaynPatrick Jupiter present.   ED course: In the ED she complained of shortness of breath with feeling palpitations and fluttering in her heart.  The cardiac monitor showed heart rate in the 110s in atrial fibrillation.  She received IV metoprolol.  Rectal exam done by the ER physician assistant showed melenic stool.  Hemoccult positive. She received Protonix 80mg72mloS  Labs: Sodium 143.  Potassium 5.0.  Glucose 100.  BUN 56 (baseline BUN 24).  Creatinine 0.67.  Calcium 9.1.  Alk phos 72.  Albumin 4.0.  AST 21.  ALT 23.  Total bili 0.8.  Lactic acid 2.3.  WBC 12.5.  Hemoglobin 13.2.  Hematocrit 40.4.  MCV 97.8.  Platelet 199. INR 1.1. SARS Corona virus 2 pending.   Labs 4/12 at 1338:  Hemoglobin 12.9.  Hematocrit 38.0.   Past Medical History:  Diagnosis Date  . Anemia    PMH  . Aneurysm (arteriovenous) of coronary vessels   . Atrial fibrillation ([name]) [place]8/2008  . BACK PAIN, UPPER 07/09/2008  . Breast cancer, stage 1 ([name]) [place] Bruises easily   . Cataract    history  . Complication of anesthesia   . Diverticulitis   . DIVERTICULOSIS, COLON 12/26/2006  . Essential hypertension 10/27/2017  . Follicular  non-Hodgkin's lymphoma ([place])   . Goiter    multi-nodular  . Hx of colonoscopy 2009  . LIVER FUNCTION TESTS, ABNORMAL, HX OF 12/26/2007  . Memory loss 05/16/2007  . OSTEOARTHRITIS 12/26/2006   takes Diclofenac daily but has stopped for surgery  . PONV (postoperative nausea and vomiting)   . Skin cancer   . VERTIGO 09/07/2009  . Wears dentures   . Wears glasses   . Wears hearing aid    bilateral    Past Surgical History:  Procedure Laterality Date  . ABDOMINAL HYSTERECTOMY    . ABLATION OF DYSRHYTHMIC FOCUS  2009  . APPENDECTOMY    . AUGMENTATION MAMMAPLASTY Right 2013  . BREAST BIOPSY  2013  . BREAST RECONSTRUCTION  04/14/2011    Procedure: BREAST RECONSTRUCTION;  Surgeon: Crissie Reese, MD;  Location: [place];  Service: Plastics;  Laterality: Right;  Placement of Right Breast Tissue Expander with  use of Flex HD for Breast Reconstruction  . BREAST SURGERY     right sm, snbx  . CATARACT EXTRACTION W/ INTRAOCULAR LENS  IMPLANT, BILATERAL  2010   bilateral  . COLONOSCOPY    . COLONOSCOPY    . KNEE SURGERY  2004/2010   arthroscopic/ bilateral knee replacements  . LOOP RECORDER IMPLANT    . MASTECTOMY Right 2013  . PORT-A-CATH REMOVAL N/A 12/07/2016   Procedure: REMOVAL PORT-A-CATH;  Surgeon: Rolm Bookbinder, MD;  Location: [place];  Service: General;  Laterality: N/A;  . PORTACATH PLACEMENT Right 08/11/2014   Procedure: INSERTION PORT-A-CATH WITH ULTRASOUND;  Surgeon: Rolm Bookbinder, MD;  Location: [place];  Service: General;  Laterality: Right;  . [place]   right  . tmi  1998  . TONSILLECTOMY      Prior to Admission medications   Medication Sig Start Date End Date Taking? Authorizing Provider  amLODipine (NORVASC) 5 MG tablet TAKE 1 TABLET BY MOUTH DAILY 10/29/18   Burnell Blanks, MD  apixaban (ELIQUIS) 5 MG TABS tablet Eliquis 5 mg tablet    [provider]  ASPIRIN PO Take 81 mg by mouth daily.    [provider]  benzonatate (TESSALON PERLES) 100 MG capsule Take 1 capsule (100 mg total) by mouth 2 (two) times daily as needed. 04/30/19   Lucretia Kern, DO  doxycycline (VIBRAMYCIN) 100 MG capsule Take 100 mg by mouth 2 (two) times daily.    [provider]  famotidine (PEPCID) 20 MG tablet Take 20 mg by mouth 2 (two) times daily.    [provider]  furosemide (LASIX) 20 MG tablet Take 1 tablet (20 mg total) by mouth daily. 02/20/19   Burnell Blanks, MD  meclizine (ANTIVERT) 25 MG tablet TAKE 1 TABLET(25 MG) BY MOUTH EVERY 6 HOURS AS NEEDED FOR DIZZINESS 11/13/18   Koberlein, Steele Berg, MD  meloxicam (MOBIC) 7.5 MG tablet TAKE 1 TABLET(7.5 MG)  BY MOUTH DAILY 10/26/18   Koberlein, Steele Berg, MD  metoprolol tartrate (LOPRESSOR) 25 MG tablet Take 1 tablet (25 mg total) by mouth 3 (three) times daily. May take 1 extra tablet as needed for heart rate over 100 10/17/18   Lenze, Jennye Moccasin, PA-C  ondansetron (ZOFRAN) 4 MG tablet Take 1 tablet (4 mg total) by mouth every 8 (eight) hours as needed for nausea or vomiting. 08/23/16   Marletta Lor, MD  pantoprazole (PROTONIX) 40 MG tablet pantoprazole 40 mg tablet,delayed release    [provider]    Current Facility-Administered Medications  Medication Dose Route Frequency Provider Last Rate Last Admin  . 0.9 %  sodium chloride infusion   Intravenous Continuous Regalado, Belkys A, MD      . metoprolol tartrate (LOPRESSOR) injection 5 mg  5 mg Intravenous Q8H Regalado, Belkys A, MD      . pantoprazole (PROTONIX) 80 mg in sodium chloride 0.9 % 100 mL (0.8 mg/mL) infusion  8 mg/hr Intravenous Continuous Regalado, Belkys A, MD      . pantoprazole (PROTONIX) 80 mg in sodium chloride 0.9 % 100 mL IVPB  80 mg Intravenous Once Regalado, Belkys A, MD      . Derrill Memo ON 05/10/2019] pantoprazole (PROTONIX) injection 40 mg  40 mg Intravenous Q12H Regalado, Belkys A, MD      . sodium chloride 0.9 % bolus 500 mL  500 mL Intravenous Once Regalado, Belkys A, MD       Current Outpatient Medications  Medication Sig Dispense Refill  . amLODipine (NORVASC) 5 MG tablet TAKE 1 TABLET BY MOUTH DAILY 90 tablet 2  . apixaban (ELIQUIS) 5 MG TABS tablet Eliquis 5 mg tablet    . ASPIRIN PO Take 81 mg by mouth daily.    . benzonatate (TESSALON PERLES) 100 MG capsule Take 1 capsule (100 mg total) by mouth 2 (two) times daily as needed. 20 capsule 0  . doxycycline (VIBRAMYCIN) 100 MG capsule Take 100 mg by mouth 2 (two) times daily.    . famotidine (PEPCID) 20 MG tablet Take 20 mg by mouth 2 (two) times daily.    . furosemide (LASIX) 20 MG tablet Take 1 tablet (20 mg total) by mouth daily. 90 tablet 1  .  meclizine (ANTIVERT) 25 MG tablet TAKE 1 TABLET(25 MG) BY MOUTH EVERY 6 HOURS AS NEEDED FOR DIZZINESS 100 tablet 2  . meloxicam (MOBIC) 7.5 MG tablet TAKE 1 TABLET(7.5 MG) BY MOUTH DAILY 90 tablet 1  . metoprolol tartrate (LOPRESSOR) 25 MG tablet Take 1 tablet (25 mg total) by mouth 3 (three) times daily. May take 1 extra tablet as needed for heart rate over 100 270 tablet 3  . ondansetron (ZOFRAN) 4 MG tablet Take 1 tablet (4 mg total) by mouth every 8 (eight) hours as needed for nausea or vomiting. 20 tablet 0  . pantoprazole (PROTONIX) 40 MG tablet pantoprazole 40 mg tablet,delayed release      Allergies as of 05/06/2019 - Review Complete 05/06/2019  Allergen Reaction Noted  . Codeine phosphate Other (See Comments)   . Adhesive [tape]  02/14/2018  . Codeine Other (See Comments) 03/23/2011    Family History  Problem Relation Age of Onset  . Colon cancer Mother   . Cancer Mother        colon  . Colon cancer Maternal Aunt   . Cancer Maternal Uncle   . Prostate cancer Maternal Uncle   . Prostate cancer Maternal Uncle   . Other Maternal Uncle   . Cancer Sister        kidney  . Cancer Brother        lymph node  . Anesthesia problems Neg Hx   . Hypotension Neg Hx   . Malignant hyperthermia Neg Hx   . Pseudochol deficiency Neg Hx   . Breast cancer Neg Hx     Social History   Socioeconomic History  . Marital status: Widowed    Spouse name: Not on file  . Number of children: Not on file  . Years of education: Not on file  . Highest education  level: Not on file  Occupational History  . Not on file  Tobacco Use  . Smoking status: Former Smoker    Packs/day: 0.10    Years: 20.00    Pack years: 2.00    Types: Cigarettes    Quit date: 01/24/1978    Years since quitting: 41.3  . Smokeless tobacco: Never Used  Substance and Sexual Activity  . Alcohol use: No  . Drug use: No  . Sexual activity: Yes    Birth control/protection: Surgical  Other Topics Concern  . Not on file    Social History Narrative  . Not on file   Social Determinants of Health   Financial Resource Strain:   . Difficulty of Paying Living Expenses:   Food Insecurity:   . Worried About Charity fundraiser in the Last Year:   . Arboriculturist in the Last Year:   Transportation Needs:   . Film/video editor (Medical):   [name] Lack of Transportation (Non-Medical):   Physical Activity:   . Days of Exercise per Week:   . Minutes of Exercise per Session:   Stress:   . Feeling of Stress :   Social Connections:   . Frequency of Communication with Friends and Family:   . Frequency of Social Gatherings with Friends and Family:   . Attends Religious Services:   . Active Member of Clubs or Organizations:   . Attends Archivist Meetings:   [name] Marital Status:   Intimate Partner Violence:   . Fear of Current or Ex-Partner:   . Emotionally Abused:   [name] Physically Abused:   . Sexually Abused:     Review of Systems: See HPI, all other systems reviewed and are negative.   Physical Exam: Vital signs in last 24 hours: Temp:  [98 F (36.7 C)-98.4 F (36.9 C)] 98.4 F (36.9 C) (04/12 1310) Pulse Rate:  [57-110] 110 (04/12 1600) Resp:  [19-21] 20 (04/12 1600) BP: (117-144)/(75-95) 117/95 (04/12 1600) SpO2:  [96 %-97 %] 97 % (04/12 1600) Weight:  [93 kg-93.5 kg] 93 kg (04/12 1306)   General:  Alert, well developed 84 year old female in NAD.  Head:  Normocephalic and atraumatic. Eyes:  No scleral icterus. Conjunctiva pink. Ears:  Normal auditory acuity. Nose:  No deformity, discharge or lesions. Mouth:  Upper and lower dentures intact.  No ulcers or lesions.  Neck:  Supple. No lymphadenopathy or thyromegaly.  Lungs:  Breath sounds clear throughout.  Heart:  Irregular rhythm, no murmur.  Abdomen:  Soft, nondistended. Nontender. + BS x quads. Lower abdominal scar intact. No HSM. Rectal: Rectal exam done by the ER PA-C documented melenic stool, guaiac +.  Musculoskeletal:   Symmetrical without gross deformities.  Pulses:  Normal pulses noted. Extremities:  1+ LE edema.  Neurologic:  Alert and  oriented x4. No focal deficits.  Skin:  Intact without significant lesions or rashes. Psych:  Alert and cooperative. Normal mood and affect.  Intake/Output from previous day: No intake/output data recorded. Intake/Output this shift: Total I/O In: -  Out: 300 [Urine:300]  Lab Results: Recent Labs    05/06/19 1320 05/06/19 1338  WBC 12.5*  --   HGB 13.2 12.9  HCT 40.4 38.0  PLT 199  --    BMET Recent Labs    05/06/19 1320 05/06/19 1338  NA 143 143  K 5.0 4.7  CL 110 109  CO2 25  --   GLUCOSE 100* 99  BUN 56* 50*  CREATININE 0.67 0.70  CALCIUM 9.1  --    LFT Recent Labs    05/06/19 1320  PROT 6.4*  ALBUMIN 4.0  AST 21  ALT 23  ALKPHOS 72  BILITOT 0.8   PT/INR Recent Labs    05/06/19 1320  LABPROT 14.2  INR 1.1   Hepatitis Panel No results for input(s): HEPBSAG, HCVAB, HEPAIGM, HEPBIGM in the last 72 hours.    Studies/Results: DG ABD ACUTE 2+V W 1V CHEST  Result Date: 05/06/2019 CLINICAL DATA:  Vomiting and abdominal pain with GI bleeding EXAM: DG ABDOMEN ACUTE W/ 1V CHEST COMPARISON:  None. FINDINGS: Cardiac shadow is at the upper limits of normal in size. Loop recorder is noted aortic calcifications are seen. The lungs are mildly hyperinflated without focal infiltrate or sizable effusion. Scattered large and small bowel gas is noted. No abnormal mass or abnormal calcifications are seen. No free air is noted. Degenerative changes of lumbar spine are seen. IMPRESSION: No acute abnormality noted. Electronically Signed   By: Inez Catalina M.D.   On: 05/06/2019 14:29    IMPRESSION/PLAN:  42. 84 year old female with a history of atrial fibrillation on Eliquis + Meloxicam 7.56m QD x 20 years and last week took  Meloxicam bid x 3 days, occasional Advil use presents to the ED with UGI bleed/hematemesis and melena. Hg 13.2 (baseline Hg 14.6  on 12/13/2018). BUN 56  (baseline BUN 24). Received Protonix 847mIV load and 25m15mr infusion started in the ED.  -Check H/H Q 8 hours -Continue PPI infusion -Clear liquid diet  -NPO after midnight -EGD benefits and risks discussed including risk with sedation, risk of bleeding, perforation and infection  -Transfuse for Hg < 7.  -Reglan 48m125m at 8pm tonight and at 9am 4/13.   2. History of breast cancer  3. History of Non Hodgkin's lymphoma   Further recommendations per Dr. MansCorena PilgrimeDorathy Daft12/2021, 4:17 PM

## 2019-05-06 NOTE — ED Provider Notes (Signed)
[facility] DEPT Provider Note   CSN: [id number] Arrival date & time: 05/06/19  1242     History Chief Complaint  Patient presents with  . Abdominal Pain    Selena [name] is a 84 y.o. female who presents the emergency with a chief complaint of black diarrhea.  Patient was sent in from her PCPs office for suspected GI bleed.  She states last night around midnight she got sick to her stomach and one episode of vomiting with blood streaked vomitus.  It was bright red.  Patient states that this morning when she went to use the restroom she had black tarry stool.  She denies any active abdominal pain but is feeling dizzy with standing.  She states this is different from her occasional vertigo.  HPI     Past Medical History:  Diagnosis Date  . Anemia    PMH  . Aneurysm (arteriovenous) of coronary vessels   . Atrial fibrillation ([place]) 01/01/2007  . BACK PAIN, UPPER 07/09/2008  . Breast cancer, stage 1 ([place])   . Bruises easily   . Cataract    history  . Complication of anesthesia   . Diverticulitis   . DIVERTICULOSIS, COLON 12/26/2006  . Essential hypertension 10/27/2017  . Follicular non-Hodgkin's lymphoma ([place])   . Goiter    multi-nodular  . Hx of colonoscopy 2009  . LIVER FUNCTION TESTS, ABNORMAL, HX OF 12/26/2007  . Memory loss 05/16/2007  . OSTEOARTHRITIS 12/26/2006   takes Diclofenac daily but has stopped for surgery  . PONV (postoperative nausea and vomiting)   . Skin cancer   . VERTIGO 09/07/2009  . Wears dentures   . Wears glasses   . Wears hearing aid    bilateral    Patient Active Problem List   Diagnosis Date Noted  . GI bleed 05/06/2019  . Chronic diastolic CHF (congestive heart failure) ([place]) 10/16/2018  . History of atrioventricular nodal ablation 10/16/2018  . Thoracic aortic aneurysm ([place]) 10/16/2018  . Syncope 08/21/2018  . S/P ablation of atrial flutter 07/17/2018  . Upper airway cough syndrome 10/27/2017  . Essential  hypertension 10/27/2017  . DOE (dyspnea on exertion) 10/26/2017  . Anxiety 09/10/2014  . Insomnia 09/10/2014  . Follicular 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 ([place]) 07/31/2014  . Paraspinal mass 07/16/2014  . Breast cancer of upper-outer quadrant of right female breast ([place]) 03/21/2011  . VERTIGO 09/07/2009  . Backache 07/09/2008  . FATIGUE 03/18/2008  . LFTs abnormal 12/26/2007  . MEMORY LOSS 05/16/2007  . ATRIAL FIBRILLATION 01/01/2007  . DIVERTICULOSIS, COLON 12/26/2006  . Osteoarthritis 12/26/2006    Past Surgical History:  Procedure Laterality Date  . ABDOMINAL HYSTERECTOMY    . ABLATION OF DYSRHYTHMIC FOCUS  2009  . APPENDECTOMY    . AUGMENTATION MAMMAPLASTY Right 2013  . BREAST BIOPSY  2013  . BREAST RECONSTRUCTION  04/14/2011   Procedure: BREAST RECONSTRUCTION;  Surgeon: Crissie Reese, MD;  Location: [facility];  Service: Plastics;  Laterality: Right;  Placement of Right Breast Tissue Expander with  use of Flex HD for Breast Reconstruction  . BREAST SURGERY     right sm, snbx  . CATARACT EXTRACTION W/ INTRAOCULAR LENS  IMPLANT, BILATERAL  2010   bilateral  . COLONOSCOPY    . COLONOSCOPY    . KNEE SURGERY  2004/2010   arthroscopic/ bilateral knee replacements  . LOOP RECORDER IMPLANT    . MASTECTOMY Right 2013  . PORT-A-CATH REMOVAL N/A 12/07/2016   Procedure: REMOVAL  PORT-A-CATH;  Surgeon: Rolm Bookbinder, MD;  Location: [facility];  Service: General;  Laterality: N/A;  . PORTACATH PLACEMENT Right 08/11/2014   Procedure: INSERTION PORT-A-CATH WITH ULTRASOUND;  Surgeon: Rolm Bookbinder, MD;  Location: [facility];  Service: General;  Laterality: Right;  . [place]   right  . tmi  1998  . TONSILLECTOMY       OB History   No obstetric history on file.     Family History  Problem Relation Age of Onset  . Colon cancer Mother   . Cancer Mother        colon  . Colon cancer Maternal Aunt   . Cancer Maternal Uncle   . Prostate cancer Maternal  Uncle   . Prostate cancer Maternal Uncle   . Other Maternal Uncle   . Cancer Sister        kidney  . Cancer Brother        lymph node  . Anesthesia problems Neg Hx   . Hypotension Neg Hx   . Malignant hyperthermia Neg Hx   . Pseudochol deficiency Neg Hx   . Breast cancer Neg Hx     Social History   Tobacco Use  . Smoking status: Former Smoker    Packs/day: 0.10    Years: 20.00    Pack years: 2.00    Types: Cigarettes    Quit date: 01/24/1978    Years since quitting: 41.3  . Smokeless tobacco: Never Used  Substance Use Topics  . Alcohol use: No  . Drug use: No    Home Medications Prior to Admission medications   Medication Sig Start Date End Date Taking? Authorizing Provider  amLODipine (NORVASC) 5 MG tablet TAKE 1 TABLET BY MOUTH DAILY 10/29/18  Yes Burnell Blanks, MD  apixaban (ELIQUIS) 5 MG TABS tablet Take 5 mg by mouth 2 (two) times daily.    Yes [provider]  benzonatate (TESSALON PERLES) 100 MG capsule Take 1 capsule (100 mg total) by mouth 2 (two) times daily as needed. Patient taking differently: Take 100 mg by mouth 2 (two) times daily as needed for cough.  04/30/19  Yes Lucretia Kern, DO  fexofenadine (ALLEGRA) 180 MG tablet Take 180 mg by mouth daily.   Yes [provider]  furosemide (LASIX) 20 MG tablet Take 1 tablet (20 mg total) by mouth daily. 02/20/19  Yes Burnell Blanks, MD  meclizine (ANTIVERT) 25 MG tablet TAKE 1 TABLET(25 MG) BY MOUTH EVERY 6 HOURS AS NEEDED FOR DIZZINESS Patient taking differently: Take 25 mg by mouth every 6 (six) hours as needed for dizziness.  11/13/18  Yes Koberlein, Steele Berg, MD  metoprolol tartrate (LOPRESSOR) 25 MG tablet Take 1 tablet (25 mg total) by mouth 3 (three) times daily. May take 1 extra tablet as needed for heart rate over 100 Patient taking differently: Take 25 mg by mouth as directed. Take 1 tablet (25 mg) by mouth BID and May Take 1 extra tablet as needed for heart rate over 100  10/17/18  Yes Lenze, Michele M, PA-C  Multiple Vitamins-Minerals (OCUVITE EXTRA) TABS Take 1 tablet by mouth in the morning and at bedtime.   Yes [provider]  ondansetron (ZOFRAN) 4 MG tablet Take 1 tablet (4 mg total) by mouth every 8 (eight) hours as needed for nausea or vomiting. 08/23/16  Yes Marletta Lor, MD  doxycycline (VIBRAMYCIN) 100 MG capsule Take 100 mg by mouth 2 (two) times daily.  [provider]    Allergies    Codeine phosphate, Adhesive [tape], and Codeine  Review of Systems   Review of Systems Ten systems reviewed and are negative for acute change, except as noted in the HPI.   Physical Exam Updated Vital Signs BP 139/76   Pulse 95   Temp (!) 97.5 F (36.4 C) (Oral)   Resp 19   Ht 5\' 7"  (1.702 m)   Wt 93 kg   SpO2 100%   BMI 32.11 kg/m   Physical Exam Vitals and nursing note reviewed.  Constitutional:      General: She is not in acute distress.    Appearance: She is well-developed. She is not diaphoretic.  HENT:     Head: Normocephalic and atraumatic.  Eyes:     General: No scleral icterus.    Conjunctiva/sclera: Conjunctivae normal.  Cardiovascular:     Rate and Rhythm: Normal rate and regular rhythm.     Heart sounds: Normal heart sounds. No murmur. No friction rub. No gallop.   Pulmonary:     Effort: Pulmonary effort is normal. Tachypnea present. No respiratory distress.     Breath sounds: Normal breath sounds.  Abdominal:     General: Bowel sounds are normal. There is no distension.     Palpations: Abdomen is soft. There is no mass.     Tenderness: There is no abdominal tenderness. There is no guarding.  Genitourinary:    Comments: Digital Rectal Exam reveals sphincter with good tone. No external hemorrhoids. No masses or fissures. Stool color is brown with no overt blood.   Musculoskeletal:     Cervical back: Normal range of motion.  Skin:    General: Skin is warm and dry.  Neurological:     Mental Status:  She is alert and oriented to person, place, and time.  Psychiatric:        Behavior: Behavior normal.     ED Results / Procedures / Treatments   Labs (all labs ordered are listed, but only abnormal results are displayed) Labs Reviewed  COMPREHENSIVE METABOLIC PANEL - Abnormal; Notable for the following components:      Result Value   Glucose, Bld 100 (*)    BUN 56 (*)    Total Protein 6.4 (*)    All other components within normal limits  CBC WITH DIFFERENTIAL/PLATELET - Abnormal; Notable for the following components:   WBC 12.5 (*)    Neutro Abs 9.3 (*)    Abs Immature Granulocytes 0.09 (*)    All other components within normal limits  LACTIC ACID, PLASMA - Abnormal; Notable for the following components:   Lactic Acid, Venous 2.3 (*)    All other components within normal limits  CBC - Abnormal; Notable for the following components:   WBC 14.2 (*)    RBC 3.79 (*)    All other components within normal limits  CBC - Abnormal; Notable for the following components:   WBC 10.7 (*)    RBC 3.50 (*)    Hemoglobin 11.0 (*)    HCT 34.1 (*)    All other components within normal limits  COMPREHENSIVE METABOLIC PANEL - Abnormal; Notable for the following components:   Chloride 114 (*)    Glucose, Bld 105 (*)    BUN 44 (*)    Calcium 8.6 (*)    Total Protein 5.3 (*)    Albumin 3.3 (*)    All other components within normal limits  I-STAT CHEM 8, ED -  Abnormal; Notable for the following components:   BUN 50 (*)    All other components within normal limits  POC OCCULT BLOOD, ED - Abnormal; Notable for the following components:   Fecal Occult Bld POSITIVE (*)    All other components within normal limits  SARS CORONAVIRUS 2 (TAT 6-24 HRS)  PROTIME-INR  CBC  CBC  TYPE AND SCREEN  ABO/RH  SURGICAL PATHOLOGY    EKG None  Radiology DG ABD ACUTE 2+V W 1V CHEST  Result Date: 05/06/2019 CLINICAL DATA:  Vomiting and abdominal pain with GI bleeding EXAM: DG ABDOMEN ACUTE W/ 1V CHEST  COMPARISON:  None. FINDINGS: Cardiac shadow is at the upper limits of normal in size. Loop recorder is noted aortic calcifications are seen. The lungs are mildly hyperinflated without focal infiltrate or sizable effusion. Scattered large and small bowel gas is noted. No abnormal mass or abnormal calcifications are seen. No free air is noted. Degenerative changes of lumbar spine are seen. IMPRESSION: No acute abnormality noted. Electronically Signed   By: Inez Catalina M.D.   On: 05/06/2019 14:29    Procedures .Critical Care Performed by: Margarita Mail, PA-C Authorized by: Margarita Mail, PA-C   Critical care provider statement:    Critical care time (minutes):  50   Critical care time was exclusive of:  Separately billable procedures and treating other patients   Critical care was necessary to treat or prevent imminent or life-threatening deterioration of the following conditions:  Circulatory failure (GI bleed)   Critical care was time spent personally by me on the following activities:  Discussions with consultants, evaluation of patient's response to treatment, examination of patient, ordering and performing treatments and interventions, ordering and review of laboratory studies, ordering and review of radiographic studies, pulse oximetry, re-evaluation of patient's condition, obtaining history from patient or surrogate and review of old charts   (including critical care time)  Medications Ordered in ED Medications  0.9 %  sodium chloride infusion ( Intravenous New Bag/Given 05/07/19 1211)  sodium chloride 0.9 % bolus 500 mL ( Intravenous MAR Unhold 05/07/19 0828)  meclizine (ANTIVERT) tablet 12.5 mg ( Oral MAR Unhold 05/07/19 0828)  acetaminophen (TYLENOL) tablet 650 mg ( Oral MAR Unhold 05/07/19 0828)    Or  acetaminophen (TYLENOL) suppository 650 mg ( Rectal MAR Unhold 05/07/19 0828)  morphine 2 MG/ML injection 0.5 mg ( Intravenous MAR Unhold 05/07/19 0828)  ondansetron (ZOFRAN) tablet 4  mg ( Oral MAR Unhold 05/07/19 0828)    Or  ondansetron (ZOFRAN) injection 4 mg ( Intravenous MAR Unhold 05/07/19 0828)  pantoprazole (PROTONIX) 80 mg in sodium chloride 0.9 % 100 mL IVPB ( Intravenous MAR Unhold 05/07/19 0828)  pantoprazole (PROTONIX) 80 mg in sodium chloride 0.9 % 100 mL (0.8 mg/mL) infusion (has no administration in time range)  pantoprazole (PROTONIX) injection 40 mg ( Intravenous MAR Unhold 05/07/19 0828)  lactated ringers infusion ( Intravenous New Bag/Given 05/07/19 0701)  metoprolol tartrate (LOPRESSOR) tablet 25 mg (25 mg Oral Given 05/07/19 1209)  pantoprazole (PROTONIX) injection 40 mg (40 mg Intravenous Given 05/06/19 1427)  metoCLOPramide (REGLAN) injection 10 mg (10 mg Intravenous Given 05/06/19 2143)  metoCLOPramide (REGLAN) injection 10 mg (10 mg Intravenous Given 05/07/19 0930)    ED Course  I have reviewed the triage vital signs and the nursing notes.  Pertinent labs & imaging results that were available during my care of the patient were reviewed by me and considered in my medical decision making (see chart for details).  Clinical  Course as of May 06 1225  Mon May 06, 2019  1436 BUN(!): 50 [AH]  1559 I spoke with Luna Pier PA with [facility] who states they will consult on the patient, but did not comment on when the will scope.   [AH]    Clinical Course User Index [AH] Margarita Mail, PA-C   MDM Rules/Calculators/A&P                     [id number] VS: BP 139/76   Pulse 95   Temp (!) 97.5 F (36.4 C) (Oral)   Resp 19   Ht 5\' 7"  (1.702 m)   Wt 93 kg   SpO2 100%   BMI 32.11 kg/m  [id number] is gathered by Patient  and EMR. Previous records obtained and reviewed. DDX:The patient's complaint of melena involves an extensive number of diagnostic and treatment options, and is a complaint that carries with it a high risk of complications, morbidity, and potential mortality. Given the large differential diagnosis, medical decision making is of high  complexity. GI bleed, Iron/ bismuth ingestion Labs: I ordered reviewed and interpreted labs which including CBC which shows a hemoglobin of 13.2.  Patient does have an elevated lactic acid at 2.3 likely secondary to her active GI bleed.  This is also bolstered by the patient's elevated BUN at 50.  She has a normal creatinine.  Patient CMP shows slightly low protein level at 6.4 of insignificant value.  Type and screen shows O+ blood.  Patient had melanotic stool and positive fecal occult test. Imaging: I ordered and reviewed images which included 2 view chest x-ray. I independently visualized and interpreted all imaging. There are no acute, significant findings on today's images. EKG: Consults: I discussed the case with gastroenterology who will consult on the patient.  I also discussed the case with Dr. Trinna Post who will admit the patient to the stepdown unit MDM: Patient with active gastrointestinal bleed.  She currently has a normal hemoglobin however I have concerned that her bleed could become unstable and she will certainly need a stepdown admission.  This is backed up by her findings of orthostatic hypotension, elevated lactic acidosis, and elevated BUN all suggestive of active bleeding.  Furthermore the patient is on Eliquis for her atrial fibrillation.  The patient is high risk and will need further monitoring in the progressive unit.  She has been stable throughout her visit here in the emergency department.  She is receiving fluids.  She will not need blood transfusion at this time.  She is receiving Protonix IV drip. Patient disposition: Admit The patient appears reasonably stabilized for admission considering the current resources, flow, and capabilities available in the ED at this time, and I doubt any other [facility] requiring further screening and/or treatment in the ED prior to admission.        Final Clinical Impression(s) / ED Diagnoses Final diagnoses:  None    Rx / DC Orders ED  Discharge Orders    None       Margarita Mail, PA-C 05/07/19 1228    Lennice Sites, DO 05/09/19 [phone]

## 2019-05-06 NOTE — Patient Instructions (Addendum)
A few things to remember from today's visit:   Gastrointestinal hemorrhage with hematemesis  Dyspnea, unspecified type - Plan: EKG 12-Lead  Paroxysmal atrial fibrillation ([name])  Chest tightness - Plan: EKG 12-Lead  I am sending you to the ER now, your heart will get in trouble if you have another bloody vomiting. Eliquis increases risk of bleeding,so you may need to have a discussing with your cardiologist/pcp about decreasing or stopping med and possible complications if doing so (atrial fib). You may need a blood transfusion.  Please be sure medication list is accurate. If a new problem present, please set up appointment sooner than planned today.

## 2019-05-06 NOTE — Telephone Encounter (Signed)
Called patient to get more information about reason for visit today. Patient stated that yesterday she was drinking some water and had some chest pain because she could not burp. Patient stated that she vomited and it was bright red blood on yesterday. Patient stated that on this morning she woke up with very black stools and very weak. Patient advised to go to ER per Dr. Martinique, patient declined because she said there were too many diseases there.  Dr. Martinique advised patient to come on in the office to be seen.

## 2019-05-07 ENCOUNTER — Encounter (HOSPITAL_COMMUNITY)
Admission: EM | Disposition: A | Discharge status: Home / Self Care | Payer: Self-pay | Source: Home / Self Care | Attending: Family Medicine

## 2019-05-07 ENCOUNTER — Observation Stay (HOSPITAL_COMMUNITY): Payer: Medicare Other | Admitting: Certified Registered Nurse Anesthetist

## 2019-05-07 ENCOUNTER — Encounter (HOSPITAL_COMMUNITY): Payer: Self-pay | Admitting: Internal Medicine

## 2019-05-07 DIAGNOSIS — K222 Esophageal obstruction: Secondary | ICD-10-CM | POA: Diagnosis present

## 2019-05-07 DIAGNOSIS — D62 Acute posthemorrhagic anemia: Secondary | ICD-10-CM | POA: Diagnosis present

## 2019-05-07 DIAGNOSIS — K3189 Other diseases of stomach and duodenum: Secondary | ICD-10-CM | POA: Diagnosis not present

## 2019-05-07 DIAGNOSIS — Z79899 Other long term (current) drug therapy: Secondary | ICD-10-CM | POA: Diagnosis not present

## 2019-05-07 DIAGNOSIS — Z96653 Presence of artificial knee joint, bilateral: Secondary | ICD-10-CM | POA: Diagnosis present

## 2019-05-07 DIAGNOSIS — I4819 Other persistent atrial fibrillation: Secondary | ICD-10-CM | POA: Diagnosis present

## 2019-05-07 DIAGNOSIS — Z7901 Long term (current) use of anticoagulants: Secondary | ICD-10-CM | POA: Diagnosis not present

## 2019-05-07 DIAGNOSIS — Z87891 Personal history of nicotine dependence: Secondary | ICD-10-CM | POA: Diagnosis not present

## 2019-05-07 DIAGNOSIS — I480 Paroxysmal atrial fibrillation: Secondary | ICD-10-CM

## 2019-05-07 DIAGNOSIS — E042 Nontoxic multinodular goiter: Secondary | ICD-10-CM | POA: Diagnosis present

## 2019-05-07 DIAGNOSIS — Z90710 Acquired absence of both cervix and uterus: Secondary | ICD-10-CM | POA: Diagnosis not present

## 2019-05-07 DIAGNOSIS — Z9011 Acquired absence of right breast and nipple: Secondary | ICD-10-CM | POA: Diagnosis not present

## 2019-05-07 DIAGNOSIS — E872 Acidosis: Secondary | ICD-10-CM | POA: Diagnosis present

## 2019-05-07 DIAGNOSIS — I10 Essential (primary) hypertension: Secondary | ICD-10-CM | POA: Diagnosis not present

## 2019-05-07 DIAGNOSIS — Z9221 Personal history of antineoplastic chemotherapy: Secondary | ICD-10-CM | POA: Diagnosis not present

## 2019-05-07 DIAGNOSIS — K449 Diaphragmatic hernia without obstruction or gangrene: Secondary | ICD-10-CM | POA: Diagnosis present

## 2019-05-07 DIAGNOSIS — Z853 Personal history of malignant neoplasm of breast: Secondary | ICD-10-CM | POA: Diagnosis not present

## 2019-05-07 DIAGNOSIS — I951 Orthostatic hypotension: Secondary | ICD-10-CM | POA: Diagnosis present

## 2019-05-07 DIAGNOSIS — I5032 Chronic diastolic (congestive) heart failure: Secondary | ICD-10-CM | POA: Diagnosis present

## 2019-05-07 DIAGNOSIS — K922 Gastrointestinal hemorrhage, unspecified: Secondary | ICD-10-CM | POA: Diagnosis not present

## 2019-05-07 DIAGNOSIS — C8290 Follicular lymphoma, unspecified, unspecified site: Secondary | ICD-10-CM | POA: Diagnosis present

## 2019-05-07 DIAGNOSIS — K259 Gastric ulcer, unspecified as acute or chronic, without hemorrhage or perforation: Secondary | ICD-10-CM | POA: Diagnosis not present

## 2019-05-07 DIAGNOSIS — Z20822 Contact with and (suspected) exposure to covid-19: Secondary | ICD-10-CM | POA: Diagnosis present

## 2019-05-07 DIAGNOSIS — Z791 Long term (current) use of non-steroidal anti-inflammatories (NSAID): Secondary | ICD-10-CM | POA: Diagnosis not present

## 2019-05-07 DIAGNOSIS — K219 Gastro-esophageal reflux disease without esophagitis: Secondary | ICD-10-CM | POA: Diagnosis present

## 2019-05-07 DIAGNOSIS — K921 Melena: Secondary | ICD-10-CM | POA: Diagnosis not present

## 2019-05-07 DIAGNOSIS — K279 Peptic ulcer, site unspecified, unspecified as acute or chronic, without hemorrhage or perforation: Secondary | ICD-10-CM | POA: Diagnosis not present

## 2019-05-07 DIAGNOSIS — I110 Hypertensive heart disease with heart failure: Secondary | ICD-10-CM | POA: Diagnosis present

## 2019-05-07 DIAGNOSIS — K250 Acute gastric ulcer with hemorrhage: Secondary | ICD-10-CM | POA: Diagnosis present

## 2019-05-07 HISTORY — PX: BIOPSY: SHX5522

## 2019-05-07 HISTORY — PX: ESOPHAGOGASTRODUODENOSCOPY (EGD) WITH PROPOFOL: SHX5813

## 2019-05-07 LAB — CBC
HCT: 34.1 % — ABNORMAL LOW (ref 36.0–46.0)
HCT: 35.4 % — ABNORMAL LOW (ref 36.0–46.0)
HCT: 31.9 % — ABNORMAL LOW (ref 36.0–46.0)
Hemoglobin: 11 g/dL — ABNORMAL LOW (ref 12.0–15.0)
Hemoglobin: 11.9 g/dL — ABNORMAL LOW (ref 12.0–15.0)
Hemoglobin: 10.4 g/dL — ABNORMAL LOW (ref 12.0–15.0)
MCH: 31.4 pg (ref 26.0–34.0)
MCH: 32 pg (ref 26.0–34.0)
MCH: 32 pg (ref 26.0–34.0)
MCHC: 32.3 g/dL (ref 30.0–36.0)
MCHC: 33.6 g/dL (ref 30.0–36.0)
MCHC: 32.6 g/dL (ref 30.0–36.0)
MCV: 97.4 fL (ref 80.0–100.0)
MCV: 95.2 fL (ref 80.0–100.0)
MCV: 98.2 fL (ref 80.0–100.0)
Platelets: 171 10*3/uL (ref 150–400)
Platelets: UNDETERMINED 10*3/uL (ref 150–400)
Platelets: 168 10*3/uL (ref 150–400)
RBC: 3.5 MIL/uL — ABNORMAL LOW (ref 3.87–5.11)
RBC: 3.72 MIL/uL — ABNORMAL LOW (ref 3.87–5.11)
RBC: 3.25 MIL/uL — ABNORMAL LOW (ref 3.87–5.11)
RDW: 13.2 % (ref 11.5–15.5)
RDW: 13.2 % (ref 11.5–15.5)
RDW: 13.2 % (ref 11.5–15.5)
WBC: 10.7 10*3/uL — ABNORMAL HIGH (ref 4.0–10.5)
WBC: 10.5 10*3/uL (ref 4.0–10.5)
WBC: 9.5 10*3/uL (ref 4.0–10.5)
nRBC: 0 % (ref 0.0–0.2)
nRBC: 0 % (ref 0.0–0.2)
nRBC: 0 % (ref 0.0–0.2)

## 2019-05-07 LAB — COMPREHENSIVE METABOLIC PANEL
ALT: 19 U/L (ref 0–44)
AST: 18 U/L (ref 15–41)
Albumin: 3.3 g/dL — ABNORMAL LOW (ref 3.5–5.0)
Alkaline Phosphatase: 53 U/L (ref 38–126)
Anion gap: 9 (ref 5–15)
BUN: 44 mg/dL — ABNORMAL HIGH (ref 8–23)
CO2: 22 mmol/L (ref 22–32)
Calcium: 8.6 mg/dL — ABNORMAL LOW (ref 8.9–10.3)
Chloride: 114 mmol/L — ABNORMAL HIGH (ref 98–111)
Creatinine, Ser: 0.73 mg/dL (ref 0.44–1.00)
GFR calc Af Amer: >60 mL/min (ref >60)
GFR calc non Af Amer: >60 mL/min (ref >60)
Glucose, Bld: 105 mg/dL — ABNORMAL HIGH (ref 70–99)
Potassium: 4 mmol/L (ref 3.5–5.1)
Sodium: 145 mmol/L (ref 135–145)
Total Bilirubin: 1.1 mg/dL (ref 0.3–1.2)
Total Protein: 5.3 g/dL — ABNORMAL LOW (ref 6.5–8.1)

## 2019-05-07 LAB — CUP PACEART REMOTE DEVICE CHECK
Date Time Interrogation Session: 20210413061218
Implantable Pulse Generator Implant Date: 20200827

## 2019-05-07 LAB — SARS CORONAVIRUS 2 (TAT 6-24 HRS): SARS Coronavirus 2: NEGATIVE

## 2019-05-07 SURGERY — ESOPHAGOGASTRODUODENOSCOPY (EGD) WITH PROPOFOL
Anesthesia: Monitor Anesthesia Care

## 2019-05-07 MED ORDER — LACTATED RINGERS IV SOLN: INTRAVENOUS | Status: DC | PRN

## 2019-05-07 MED ORDER — METOPROLOL TARTRATE 25 MG PO TABS
25.0000 mg | ORAL_TABLET | Freq: Two times a day (BID) | ORAL | Status: DC
  Administered 2019-05-07 – 2019-05-10 (×7): 25 mg via ORAL
  Filled 2019-05-07 (×7): qty 1

## 2019-05-07 MED ORDER — PROPOFOL 500 MG/50ML IV EMUL
INTRAVENOUS | Status: DC | PRN
  Administered 2019-05-07: 100 ug/kg/min via INTRAVENOUS

## 2019-05-07 MED ORDER — PROPOFOL 500 MG/50ML IV EMUL
INTRAVENOUS | Status: AC
  Filled 2019-05-07: qty 50

## 2019-05-07 MED ORDER — METOPROLOL TARTRATE 25 MG PO TABS: 25.0000 mg | ORAL_TABLET | Freq: Two times a day (BID) | ORAL | Status: DC

## 2019-05-07 MED ORDER — PROPOFOL 10 MG/ML IV BOLUS
INTRAVENOUS | Status: AC
  Filled 2019-05-07: qty 20

## 2019-05-07 MED ORDER — PROPOFOL 10 MG/ML IV BOLUS
INTRAVENOUS | Status: DC | PRN
  Administered 2019-05-07: 20 mg via INTRAVENOUS

## 2019-05-07 MED ORDER — SODIUM CHLORIDE 0.9 % IV SOLN: INTRAVENOUS | Status: DC

## 2019-05-07 MED ORDER — LACTATED RINGERS IV SOLN: INTRAVENOUS | Status: DC

## 2019-05-07 MED ORDER — LIDOCAINE 2% (20 MG/ML) 5 ML SYRINGE
INTRAMUSCULAR | Status: DC | PRN
  Administered 2019-05-07: 60 mg via INTRAVENOUS

## 2019-05-07 SURGICAL SUPPLY — 15 items

## 2019-05-07 NOTE — Anesthesia Preprocedure Evaluation (Signed)
Anesthesia Evaluation  Patient identified by MRN, date of birth, ID band Patient awake    Reviewed: Allergy & Precautions, H&P , NPO status , Patient's Chart, lab work & pertinent test results  History of Anesthesia Complications (+) PONV and history of anesthetic complications  Airway Mallampati: II  TM Distance: >3 FB Neck ROM: Full    Dental no notable dental hx. (+) Teeth Intact, Dental Advisory Given   Pulmonary neg pulmonary ROS, former smoker,    Pulmonary exam normal breath sounds clear to auscultation       Cardiovascular hypertension, Pt. on medications  Rhythm:Regular Rate:Normal     Neuro/Psych Anxiety negative neurological ROS  negative psych ROS   GI/Hepatic negative GI ROS, Neg liver ROS,   Endo/Other  negative endocrine ROS  Renal/GU negative Renal ROS  negative genitourinary   Musculoskeletal  (+) Arthritis , Osteoarthritis,    Abdominal   Peds  Hematology  (+) Blood dyscrasia, anemia , Lymphoma    Anesthesia Other Findings   Reproductive/Obstetrics negative OB ROS                             Anesthesia Physical  Anesthesia Plan  ASA: III  Anesthesia Plan: MAC   Post-op Pain Management:    Induction: Intravenous  PONV Risk Score and Plan: 3 and Ondansetron, Dexamethasone and Propofol infusion  Airway Management Planned: Simple Face Mask  Additional Equipment:   Intra-op Plan:   Post-operative Plan:   Informed Consent: I have reviewed the patients History and Physical, chart, labs and discussed the procedure including the risks, benefits and alternatives for the proposed anesthesia with the patient or authorized representative who has indicated his/her understanding and acceptance.     Dental advisory given  Plan Discussed with: CRNA and Anesthesiologist  Anesthesia Plan Comments:         Anesthesia Quick Evaluation

## 2019-05-07 NOTE — Op Note (Signed)
[facility] Patient Name: Selena [name] Procedure Date: 05/07/2019 MRN: [id number] Attending MD: Justice Britain , MD Date of Birth: [birth year] CSN: [id number] Age: 84 Admit Type: Outpatient Procedure:                Upper GI endoscopy Indications:              Coffee-ground emesis, Hematemesis, Melena, Recent                            gastrointestinal bleeding Providers:                Justice Britain, MD, Cleda Daub, RN, Theodora Blow, Technician Referring MD:             Noralyn Pick, [facility] Medicines:                Monitored Anesthesia Care Complications:            No immediate complications. Estimated Blood Loss:     Estimated blood loss was minimal. Procedure:                Pre-Anesthesia Assessment:                           - Prior to the procedure, a History and Physical                            was performed, and patient medications and                            allergies were reviewed. The patient's tolerance of                            previous anesthesia was also reviewed. The risks                            and benefits of the procedure and the sedation                            options and risks were discussed with the patient.                            All questions were answered, and informed consent                            was obtained. Prior Anticoagulants: The patient has                            taken Eliquis (apixaban), last dose was 2 days                            prior to procedure. ASA Grade Assessment: III - A  patient with severe systemic disease. After                            reviewing the risks and benefits, the patient was                            deemed in satisfactory condition to undergo the                            procedure.                           After obtaining informed consent, the endoscope was    passed under direct vision. Throughout the                            procedure, the patient's blood pressure, pulse, and                            oxygen saturations were monitored continuously. The                            [id number]-[id number] ([id number]) was introduced through the                            mouth, and advanced to the second part of duodenum.                            The upper GI endoscopy was accomplished without                            difficulty. The patient tolerated the procedure. Scope In: Scope Out: Findings:      A small area of extrinsic impression and mild mucosal abnormality was       found in the mid esophagus at 22 cm. Biopsies were taken with a cold       forceps for histology purposes. The scope passed with ease over this       area but there seemed as if there could be a subepithelial lesion       present.      No other gross lesions were noted in the proximal esophagus and in the       distal esophagus.      A widely patent and non-obstructing Schatzki ring was found at the       gastroesophageal junction.      A 3 cm hiatal hernia was found. The proximal extent of the gastric folds       (end of tubular esophagus) was 37 cm from the incisors. The hiatal       narrowing was 40 cm from the incisors. The Z-line was 37 cm from the       incisors and was regular.      One medium sized non-bleeding cratered gastric ulcer with a single small       flat spot (Forrest Class IIc) was found in the gastric fundus. The       lesion was 15 mm in largest dimension. This area was noted and  protuberant with concern for potentially being over a subepithelial       lesion, though not completely clear.      Many other non-bleeding superficial gastric ulcers and erosions with a       clean ulcer base (Forrest Class III) were found in the gastric body, at       the incisura, in the gastric antrum and in the prepyloric region of the       stomach. The largest lesion was  10 mm in largest dimension.      Patchy moderately erythematous mucosa without bleeding was found in the       entire examined stomach.      No other gross lesions were noted in the entire examined stomach.       Biopsies were taken with a cold forceps for histology and Helicobacter       pylori testing.      No gross lesions were noted in the duodenal bulb, in the first portion       of the duodenum and in the second portion of the duodenum. Impression:               - Extrinsic impression with mild mucosal                            abnormality noted in the mid esophagus. Biopsied.                           - No other gross lesions in esophagus.                           - Widely patent and non-obstructing Schatzki ring.                           - 3 cm hiatal hernia.                           - Non-bleeding gastric ulcer with a small flat spot                            (Forrest Class IIc) was noted in the fundal region                            coming off of the region of the diaphragmatic                            hiatus region. This could be an ulcer on a                            subepithelial lesion such as a Leiomyoma or GIST,                            but did not want to tunnel biopsy today based on                            her clinical picture and status.                           -  Many non-bleeding gastric ulcers and erosions                            with a clean ulcer base (Forrest Class III) noted.                            Erythematous mucosa in the stomach. Biopsied for HP.                           - No gross lesions in the duodenal bulb, in the                            first portion of the duodenum and in the second                            portion of the duodenum. Moderate Sedation:      Not Applicable - Patient had care per Anesthesia. Recommendation:           - The patient will be observed post-procedure,                            until all discharge  criteria are met.                           - Return patient to hospital ward for ongoing care.                           - Advance diet as tolerated.                           - Observe patient's clinical course.                           - Recommend IV PPI x 72 hours today (48 more hours                            at minimum). Will plan transition to PO PPI BID                            thereafter and eventually remain on indefinite PPI                            therapy.                           - No more NSAID use, although if Aspirin is needed                            or indicated may be OK.                           - Recommend CT-CAP with IV contrast when able in  next 24 hours to help understand the 2 areas of                            potential extrinsic impression vs Subepithelial                            lesions.                           - Patient was not on PPI prior to hospitalization                            and it is entirely possible that her increased                            Meloxicam use likely contributed to the                            ulcerations. Bleeding from any and all of the                            lesions likely. If the lesion in the fundus is a                            subepithelial lesion, it could be a GIST. It may                            heal mucosally over time with acid suppression. It                            may not and could be a nidus for recurrent                            bleeding. Will need to decide with family based on                            how she is doing as to next steps.                           - I think restarting anticoagulation is not going                            to be easy. If absolute need is noted then would                            recommend Heparin gtt in 12 hours without bolus. I                            think trying to hold the Eliquis if possible for at                             least 72 hours if not  a full week would be ideal,                            but obviously has risk with the lesions noted. If                            we are going to restart NOAC in less than 72 hours,                            I think she should be observed as an inpatient.                           - Based on CT scan results and biopsies will                            consider role of EUS in the coming weeks to further                            clarify.                           - The findings and recommendations were discussed                            with the patient.                           - The findings and recommendations were discussed                            with the patient's family.                           - The findings and recommendations were discussed                            with the referring physician. Procedure Code(s):        --- Professional ---                           [phone], Esophagogastroduodenoscopy, flexible,                            transoral; with biopsy, single or multiple Diagnosis Code(s):        --- Professional ---                           K22.2, Esophageal obstruction                           K44.9, Diaphragmatic hernia without obstruction or                            gangrene  K25.9, Gastric ulcer, unspecified as acute or                            chronic, without hemorrhage or perforation                           K31.89, Other diseases of stomach and duodenum                           K92.0, Hematemesis                           K92.1, Melena (includes Hematochezia)                           K92.2, Gastrointestinal hemorrhage, unspecified CPT copyright 2019 American Medical Association. All rights reserved. The codes documented in this report are preliminary and upon coder review may  be revised to meet current compliance requirements. Justice Britain, MD 05/07/2019 8:06:49 AM Number  of Addenda: 0

## 2019-05-07 NOTE — Anesthesia Postprocedure Evaluation (Signed)
Anesthesia Post Note  Patient: Selena [name]  Procedure(s) Performed: ESOPHAGOGASTRODUODENOSCOPY (EGD) WITH PROPOFOL (N/A ) BIOPSY     Patient location during evaluation: Endoscopy Anesthesia Type: MAC Level of consciousness: awake and alert Pain management: pain level controlled Vital Signs Assessment: post-procedure vital signs reviewed and stable Respiratory status: spontaneous breathing, nonlabored ventilation, respiratory function stable and patient connected to nasal cannula oxygen Cardiovascular status: blood pressure returned to baseline and stable Postop Assessment: no apparent nausea or vomiting Anesthetic complications: no    Last Vitals:  Vitals:   05/07/19 0808 05/07/19 0852  BP: (!) 151/73 (!) 143/79  Pulse:  95  Resp: 18 19  Temp:  (!) 36.4 C  SpO2: 98% 100%    Last Pain:  Vitals:   05/07/19 0852  TempSrc: Oral  PainSc:                  Tenille Morrill DANIEL

## 2019-05-07 NOTE — Transfer of Care (Signed)
Immediate Anesthesia Transfer of Care Note  Patient: Selena [name]  Procedure(s) Performed: ESOPHAGOGASTRODUODENOSCOPY (EGD) WITH PROPOFOL (N/A ) BIOPSY  Patient Location: Endoscopy Unit  Anesthesia Type:MAC  Level of Consciousness: awake, alert , oriented and patient cooperative  Airway & Oxygen Therapy: Patient Spontanous Breathing and Patient connected to face mask oxygen  Post-op Assessment: Report given to RN, Post -op Vital signs reviewed and stable and Patient moving all extremities  Post vital signs: Reviewed and stable  Last Vitals:  Vitals Value Taken Time  BP    Temp    Pulse 79 05/07/19 0758  Resp 15 05/07/19 0759  SpO2 100 % 05/07/19 0758  Vitals shown include unvalidated device data.  Last Pain:  Vitals:   05/07/19 0654  TempSrc: Oral  PainSc: 0-No pain         Complications: No apparent anesthesia complications

## 2019-05-07 NOTE — Progress Notes (Addendum)
PROGRESS NOTE    Selena [name]  [id number] DOB: September 30, 1935 DOA: 05/06/2019 PCP: Caren Macadam, MD   Brief Narrative:  Selena [name] is a 84 y.o. female with PMH significant for A fib on eliquis, HTN, diverticulosis, follicular non hodgkin lymphoma who presents complaining of vomiting blood bright red, 1 cup full of blood Sunday night, day prior to admission.  She also report one black tarry stool the morning of admission.  She last took Eliquis Sunday morning.  She has been taking meloxicam, recently taking 2 pills since 3 days prior to admission after she injured her ankle. She is currently feeling short of breath and feeling palpitation, fluttering in her heart.  On the monitor heart rate is in the 110s A. Fib.  I have ordered her IV metoprolol. She feels that she can use the bedpan, to have a bowel movement.  Evaluation in the ED: Sodium 143, potassium 4.7, BUN 50, creatinine 0.7, hemoglobin 12, potassium 2.3, INR 1.1 Occult blood positive.  KUB negative.   Assessment & Plan:   Active Problems:   ATRIAL FIBRILLATION   Osteoarthritis   Essential hypertension   Chronic diastolic CHF (congestive heart failure) ([name])   History of atrioventricular nodal ablation   GI bleed   1-GI bleed, likely upper GI bleed: Patient presented with hematemesis and melena.  BUN elevated. Patient high risk for bleeding, due to being on Eliquis. Hold anticoagulation. GI would like to hold anticoagulation for 72 hours, ideally one week. Cardiology consulted for risk assessment and recommendation. Per cardiology ok to hold eliquis for one week.  Continue to monitor Hb.  IV fluids. IV Protonix drip. GI consulted. Underwent endoscopy which showed small area of extrinsic compression and mild mucosal abnormality in the mid esophagus, 1 medium sized nonbleeding cratered gastric ulcer with a single small flat spot this area was noted to have a protuberant abdomen with concern of potentially  being over except epithelialization.   Multiples gastric Ulcers. -GI is recommending to continue IV Protonix drip for another 48 hours, total of 72 hours of Protonix drip. -GI is recommending also CT chest and abdomen with contrast to feeder evaluate extrinsic compression of the esophagus.  2-A. fib with RVR:  Transition from IV metoprolol to oral. Patient feeling better today, denies shortness of breath or palpitation today. Continue to  hold Eliquis  3-Chronic diastolic heart failure: Appears compensated, chest x-ray negative for pulmonary edema. No hypoxemia.  Continue to monitor.  4-hypertension: Hold Norvasc in the setting of GI bleed. 5-arthritis: Hold meloxicam due to GI bleed.  Tylenol as needed for pain.   Estimated body mass index is 32.11 kg/m as calculated from the following:   Height as of this encounter: 5\' 7"  (1.702 m).   Weight as of this encounter: 93 kg.   DVT prophylaxis: SCDs Code Status: Partial code, does not want to be intubated Family Communication: Daughter at bedside Disposition Plan:  Patient is from: Home Anticipated d/c date: Dissipate discharge home in 48 to 72 hours, patient requiring IV Protonix drip due to concern for high risk for bleeding Barriers to d/c or necessity for inpatient status: Patient need to remain in the hospital for IV Protonix drip  Consultants:   GI  Cardiology  Procedures:   Endoscopy see report above  Antimicrobials:  None  Subjective: He is feeling better, she appears more calm today.  Appears less short of breath.  She denies shortness of breath and palpitation today.  Objective: Vitals:  05/07/19 0808 05/07/19 0852 05/07/19 1209 05/07/19 1313  BP: (!) 151/73 (!) 143/79 139/76 118/68  Pulse:  95 95 70  Resp: 18 19  18   Temp:  (!) 97.5 F (36.4 C)  98.7 F (37.1 C)  TempSrc:  Oral  Oral  SpO2: 98% 100%  98%  Weight:      Height:        Intake/Output Summary (Last 24 hours) at 05/07/2019 1455 Last  data filed at 05/07/2019 0800 Gross per 24 hour  Intake 620 ml  Output 301 ml  Net 319 ml   Filed Weights   05/06/19 1306 05/07/19 0654  Weight: 93 kg 93 kg    Examination:  General exam: Appears calm and comfortable  Respiratory system: Clear to auscultation. Respiratory effort normal. Cardiovascular system: S1 & S2 heard, IRR. No JVD, murmurs, rubs, gallops or clicks. No pedal edema. Gastrointestinal system: Abdomen is nondistended, soft and nontender. No organomegaly or masses felt. Normal bowel sounds heard. Central nervous system: Alert and oriented. No focal neurological deficits. Extremities: Symmetric 5 x 5 power.    Data Reviewed: I have personally reviewed following labs and imaging studies  CBC: Recent Labs  Lab 05/06/19 1320 05/06/19 1338 05/06/19 1856 05/07/19 0420 05/07/19 1045  WBC 12.5*  --  14.2* 10.7* 10.5  NEUTROABS 9.3*  --   --   --   --   HGB 13.2 12.9 12.2 11.0* 11.9*  HCT 40.4 38.0 37.5 34.1* 35.4*  MCV 97.8  --  98.9 97.4 95.2  PLT 199  --  181 171 PLATELET CLUMPS NOTED ON SMEAR, UNABLE TO ESTIMATE   Basic Metabolic Panel: Recent Labs  Lab 05/06/19 1320 05/06/19 1338 05/07/19 0420  NA 143 143 145  K 5.0 4.7 4.0  CL 110 109 114*  CO2 25  --  22  GLUCOSE 100* 99 105*  BUN 56* 50* 44*  CREATININE 0.67 0.70 0.73  CALCIUM 9.1  --  8.6*   GFR: Estimated Creatinine Clearance: 64.6 mL/min (by C-G formula based on SCr of 0.73 mg/dL). Liver Function Tests: Recent Labs  Lab 05/06/19 1320 05/07/19 0420  AST 21 18  ALT 23 19  ALKPHOS 72 53  BILITOT 0.8 1.1  PROT 6.4* 5.3*  ALBUMIN 4.0 3.3*   No results for input(s): LIPASE, AMYLASE in the last 168 hours. No results for input(s): AMMONIA in the last 168 hours. Coagulation Profile: Recent Labs  Lab 05/06/19 1320  INR 1.1   Cardiac Enzymes: No results for input(s): CKTOTAL, CKMB, CKMBINDEX, TROPONINI in the last 168 hours. BNP (last 3 results) Recent Labs    07/17/18 1319    PROBNP 720   HbA1C: No results for input(s): HGBA1C in the last 72 hours. CBG: No results for input(s): GLUCAP in the last 168 hours. Lipid Profile: No results for input(s): CHOL, HDL, LDLCALC, TRIG, CHOLHDL, LDLDIRECT in the last 72 hours. Thyroid Function Tests: No results for input(s): TSH, T4TOTAL, FREET4, T3FREE, THYROIDAB in the last 72 hours. Anemia Panel: No results for input(s): VITAMINB12, FOLATE, FERRITIN, TIBC, IRON, RETICCTPCT in the last 72 hours. Sepsis Labs: Recent Labs  Lab 05/06/19 1320  LATICACIDVEN 2.3*    Recent Results (from the past 240 hour(s))  SARS CORONAVIRUS 2 (TAT 6-24 HRS) Nasopharyngeal Nasopharyngeal Swab     Status: None   Collection Time: 05/06/19  4:38 PM   Specimen: Nasopharyngeal Swab  Result Value Ref Range Status   SARS Coronavirus 2 NEGATIVE NEGATIVE Final    Comment: (NOTE)  SARS-CoV-2 target nucleic acids are NOT DETECTED. The SARS-CoV-2 RNA is generally detectable in upper and lower respiratory specimens during the acute phase of infection. Negative results do not preclude SARS-CoV-2 infection, do not rule out co-infections with other pathogens, and should not be used as the sole basis for treatment or other patient management decisions. Negative results must be combined with clinical observations, patient history, and epidemiological information. The expected result is Negative. Fact Sheet for Patients: SugarRoll.be Fact Sheet for Healthcare Providers: https://www.woods-mathews.com/ This test is not yet approved or cleared by the [place] FDA and  has been authorized for detection and/or diagnosis of SARS-CoV-2 by FDA under an Emergency Use Authorization (EUA). This EUA will remain  in effect (meaning this test can be used) for the duration of the COVID-19 declaration under Section 56 4(b)(1) of the Act, 21 U.S.C. section 360bbb-3(b)(1), unless the authorization is terminated  or revoked sooner. Performed at [facility], [place] [address] [place], [place] [address]          Radiology Studies: DG ABD ACUTE 2+V W 1V CHEST  Result Date: 05/06/2019 CLINICAL DATA:  Vomiting and abdominal pain with GI bleeding EXAM: DG ABDOMEN ACUTE W/ 1V CHEST COMPARISON:  None. FINDINGS: Cardiac shadow is at the upper limits of normal in size. Loop recorder is noted aortic calcifications are seen. The lungs are mildly hyperinflated without focal infiltrate or sizable effusion. Scattered large and small bowel gas is noted. No abnormal mass or abnormal calcifications are seen. No free air is noted. Degenerative changes of lumbar spine are seen. IMPRESSION: No acute abnormality noted. Electronically Signed   By: Inez Catalina M.D.   On: 05/06/2019 14:29        Scheduled Meds: . metoprolol tartrate  25 mg Oral BID  . [START ON 05/10/2019] pantoprazole  40 mg Intravenous Q12H   Continuous Infusions: . sodium chloride 100 mL/hr at 05/07/19 1211  . lactated ringers 10 mL/hr at 05/07/19 0701  . pantoprozole (PROTONIX) infusion    . pantoprazole (PROTONIX) IVPB    . sodium chloride       LOS: 0 days    Time spent: 35 minutes.    Elmarie Shiley, MD [facility]   If 7PM-7AM, please contact night-coverage [email or web address]  05/07/2019, 2:55 PM

## 2019-05-07 NOTE — Consult Note (Signed)
Cardiology Consultation:   Patient ID: Yamika Cifarelli MRN: [id number]; DOB: 07-12-1935  Admit date: 05/06/2019 Date of Consult: 05/07/2019  Primary Care Provider: Caren Macadam, MD Primary Cardiologist: Lauree Chandler, MD  Primary Electrophysiologist:  None    Patient Profile:   Tamicha Towsley is a 84 y.o. female with a hx of thoracic aortic aneurysm 4.2cm 08/2017 (needs repeat 08/2018), SVT, PACs on metoprolol, paroxysmal Afib/flutter on eliquis, HTN, diverticulitis, follicular non hodgkin lymphoma who is being seen today for the evaluation of anticoagulation in the setting of GIB at the request of Dr. Tyrell Antonio.  History of Present Illness:   Ms. Copas is followed by Dr. Angelena Form. Echo in 08/2017 showed normal LVEF 55-60%, mild LVH, G2DD. Patient had aflutter ablation in 2008 and later that year an ablation by Dr. Caryl Comes. She was taking Aspirin for anticoagulation. More recently she had a holter monitor for recurrent syncope which showed brief SVT. She then underwent ILR 08/2018 placement and interrogation showed afib started on Eliquis 09/2018. No h/o of stroke. Patient underwent TEE/DCCV 10/17/18 and patient converted to NSR but later to afib that has been mostly rate controlled.   The patient presented to the ED 4/12 for vomiting bright red blood and black tarry stools. Patient reported vomiting started 2 days ago and vomited about 1 cup of blood. They next day she ha some melena. She has been feeling weak with palpitations and sob as well. She takes Eliquis and had recently been taking an increased dose of meloxicam for an ankle injury. Denies chest pain or lower leg edema.   In the ED she was noted to be in afib RVR, HR 110s. Labs showed sodium 143, potassium 4.7, creatinine 0.7, Hgb 12. INR 1.1. Fecal occult blood positive. KUB was unremarkable. Iv metoprolol was ordered for heart rate.     Past Medical History:  Diagnosis Date  . Anemia    PMH  . Aneurysm  (arteriovenous) of coronary vessels   . Atrial fibrillation ([place]) 01/01/2007  . BACK PAIN, UPPER 07/09/2008  . Breast cancer, stage 1 ([place])   . Bruises easily   . Cataract    history  . Complication of anesthesia   . Diverticulitis   . DIVERTICULOSIS, COLON 12/26/2006  . Essential hypertension 10/27/2017  . Follicular non-Hodgkin's lymphoma ([place])   . Goiter    multi-nodular  . Hx of colonoscopy 2009  . LIVER FUNCTION TESTS, ABNORMAL, HX OF 12/26/2007  . Memory loss 05/16/2007  . OSTEOARTHRITIS 12/26/2006   takes Diclofenac daily but has stopped for surgery  . PONV (postoperative nausea and vomiting)   . Skin cancer   . VERTIGO 09/07/2009  . Wears dentures   . Wears glasses   . Wears hearing aid    bilateral    Past Surgical History:  Procedure Laterality Date  . ABDOMINAL HYSTERECTOMY    . ABLATION OF DYSRHYTHMIC FOCUS  2009  . APPENDECTOMY    . AUGMENTATION MAMMAPLASTY Right 2013  . BREAST BIOPSY  2013  . BREAST RECONSTRUCTION  04/14/2011   Procedure: BREAST RECONSTRUCTION;  Surgeon: Crissie Reese, MD;  Location: [facility];  Service: Plastics;  Laterality: Right;  Placement of Right Breast Tissue Expander with  use of Flex HD for Breast Reconstruction  . BREAST SURGERY     right sm, snbx  . CATARACT EXTRACTION W/ INTRAOCULAR LENS  IMPLANT, BILATERAL  2010   bilateral  . COLONOSCOPY    . COLONOSCOPY    . KNEE SURGERY  2004/2010  arthroscopic/ bilateral knee replacements  . LOOP RECORDER IMPLANT    . MASTECTOMY Right 2013  . PORT-A-CATH REMOVAL N/A 12/07/2016   Procedure: REMOVAL PORT-A-CATH;  Surgeon: Rolm Bookbinder, MD;  Location: [facility];  Service: General;  Laterality: N/A;  . PORTACATH PLACEMENT Right 08/11/2014   Procedure: INSERTION PORT-A-CATH WITH ULTRASOUND;  Surgeon: Rolm Bookbinder, MD;  Location: [facility];  Service: General;  Laterality: Right;  . [place]   right  . tmi  1998  . TONSILLECTOMY       Home Medications:  Prior to Admission  medications   Medication Sig Start Date End Date Taking? Authorizing Provider  amLODipine (NORVASC) 5 MG tablet TAKE 1 TABLET BY MOUTH DAILY 10/29/18  Yes Burnell Blanks, MD  apixaban (ELIQUIS) 5 MG TABS tablet Take 5 mg by mouth 2 (two) times daily.    Yes [provider]  benzonatate (TESSALON PERLES) 100 MG capsule Take 1 capsule (100 mg total) by mouth 2 (two) times daily as needed. Patient taking differently: Take 100 mg by mouth 2 (two) times daily as needed for cough.  04/30/19  Yes Lucretia Kern, DO  fexofenadine (ALLEGRA) 180 MG tablet Take 180 mg by mouth daily.   Yes [provider]  furosemide (LASIX) 20 MG tablet Take 1 tablet (20 mg total) by mouth daily. 02/20/19  Yes Burnell Blanks, MD  meclizine (ANTIVERT) 25 MG tablet TAKE 1 TABLET(25 MG) BY MOUTH EVERY 6 HOURS AS NEEDED FOR DIZZINESS Patient taking differently: Take 25 mg by mouth every 6 (six) hours as needed for dizziness.  11/13/18  Yes Koberlein, Steele Berg, MD  metoprolol tartrate (LOPRESSOR) 25 MG tablet Take 1 tablet (25 mg total) by mouth 3 (three) times daily. May take 1 extra tablet as needed for heart rate over 100 Patient taking differently: Take 25 mg by mouth as directed. Take 1 tablet (25 mg) by mouth BID and May Take 1 extra tablet as needed for heart rate over 100 10/17/18  Yes Lenze, Michele M, PA-C  Multiple Vitamins-Minerals (OCUVITE EXTRA) TABS Take 1 tablet by mouth in the morning and at bedtime.   Yes [provider]  ondansetron (ZOFRAN) 4 MG tablet Take 1 tablet (4 mg total) by mouth every 8 (eight) hours as needed for nausea or vomiting. 08/23/16  Yes Marletta Lor, MD  doxycycline (VIBRAMYCIN) 100 MG capsule Take 100 mg by mouth 2 (two) times daily.    [provider]    Inpatient Medications: Scheduled Meds: . metoprolol tartrate  5 mg Intravenous Q8H  . [START ON 05/10/2019] pantoprazole  40 mg Intravenous Q12H   Continuous Infusions: . sodium  chloride 100 mL/hr at 05/06/19 2142  . lactated ringers 10 mL/hr at 05/07/19 0701  . pantoprozole (PROTONIX) infusion    . pantoprazole (PROTONIX) IVPB    . sodium chloride     PRN Meds: acetaminophen **OR** acetaminophen, meclizine, morphine injection, ondansetron **OR** ondansetron (ZOFRAN) IV  Allergies:    Allergies  Allergen Reactions  . Codeine Phosphate Other (See Comments)    Hallucinations  . Adhesive [Tape]     Electrodes for EKG- skin blistering, erythema  . Codeine Other (See Comments)    Hallucinations  Other reaction(s): Other    Social History:   Social History   Socioeconomic History  . Marital status: Widowed    Spouse name: Not on file  . Number of children: Not on file  . Years of education: Not on file  .  Highest education level: Not on file  Occupational History  . Not on file  Tobacco Use  . Smoking status: Former Smoker    Packs/day: 0.10    Years: 20.00    Pack years: 2.00    Types: Cigarettes    Quit date: 01/24/1978    Years since quitting: 41.3  . Smokeless tobacco: Never Used  Substance and Sexual Activity  . Alcohol use: No  . Drug use: No  . Sexual activity: Yes    Birth control/protection: Surgical  Other Topics Concern  . Not on file  Social History Narrative  . Not on file   Social Determinants of Health   Financial Resource Strain:   . Difficulty of Paying Living Expenses:   Food Insecurity:   . Worried About Charity fundraiser in the Last Year:   . Arboriculturist in the Last Year:   Transportation Needs:   . Film/video editor (Medical):   [name] Lack of Transportation (Non-Medical):   Physical Activity:   . Days of Exercise per Week:   . Minutes of Exercise per Session:   Stress:   . Feeling of Stress :   Social Connections:   . Frequency of Communication with Friends and Family:   . Frequency of Social Gatherings with Friends and Family:   . Attends Religious Services:   . Active Member of Clubs or  Organizations:   . Attends Archivist Meetings:   [name] Marital Status:   Intimate Partner Violence:   . Fear of Current or Ex-Partner:   . Emotionally Abused:   [name] Physically Abused:   . Sexually Abused:     Family History:   Family History  Problem Relation Age of Onset  . Colon cancer Mother   . Cancer Mother        colon  . Colon cancer Maternal Aunt   . Cancer Maternal Uncle   . Prostate cancer Maternal Uncle   . Prostate cancer Maternal Uncle   . Other Maternal Uncle   . Cancer Sister        kidney  . Cancer Brother        lymph node  . Anesthesia problems Neg Hx   . Hypotension Neg Hx   . Malignant hyperthermia Neg Hx   . Pseudochol deficiency Neg Hx   . Breast cancer Neg Hx      ROS:  Please see the history of present illness.  All other ROS reviewed and negative.     Physical Exam/Data:   Vitals:   05/07/19 0757 05/07/19 0800 05/07/19 0808 05/07/19 0852  BP: (!) 133/59 (!) 143/68 (!) 151/73 (!) 143/79  Pulse: 70 79  95  Resp: 19 15 18 19   Temp: 97.8 F (36.6 C)   (!) 97.5 F (36.4 C)  TempSrc: Axillary   Oral  SpO2: 100% 100% 98% 100%  Weight:      Height:        Intake/Output Summary (Last 24 hours) at 05/07/2019 1046 Last data filed at 05/07/2019 0800 Gross per 24 hour  Intake 620 ml  Output 301 ml  Net 319 ml   Last 3 Weights 05/07/2019 05/06/2019 05/06/2019  Weight (lbs) 205 lb 205 lb 206 lb 2 oz  Weight (kg) 92.987 kg 92.987 kg 93.498 kg     Body mass index is 32.11 kg/m.  General:  Well nourished, well developed, in no acute distress HEENT: normal Lymph: no adenopathy Neck: no JVD Endocrine:  No thryomegaly Vascular: No carotid bruits; FA pulses 2+ bilaterally without bruits  Cardiac:  normal S1, S2; Irreg Irreg; no murmur  Lungs:  clear to auscultation bilaterally, no wheezing, rhonchi or rales  Abd: soft, nontender, no hepatomegaly  Ext: no edema Musculoskeletal:  No deformities, BUE and BLE strength normal and equal Skin:  warm and dry  Neuro:  CNs 2-12 intact, no focal abnormalities noted Psych:  Normal affect   EKG:  The EKG was personally reviewed and demonstrates:  4/12 Afib, 117 bpm, nonspecific ST changes lateral leds  Telemetry:  Telemetry was personally reviewed and demonstrates:  Afib, HR 80-90s, PVCs  Relevant CV Studies:  Echo 2019  Study Conclusions   - Left ventricle: The cavity size was normal. Wall thickness was  increased in a pattern of mild LVH. Systolic function was normal.  The estimated ejection fraction was in the range of 55% to 60%.  Wall motion was normal; there were no regional wall motion  abnormalities. Features are consistent with a pseudonormal left  ventricular filling pattern, with concomitant abnormal relaxation  and increased filling pressure (grade 2 diastolic dysfunction).  - Left atrium: The atrium was mildly dilated. Volume/bsa, ES,  (1-plane Simpson&'s, A2C): 35 ml/m^2   Laboratory Data:  High Sensitivity Troponin:  No results for input(s): TROPONINIHS in the last 720 hours.   Chemistry Recent Labs  Lab 05/06/19 1320 05/06/19 1338 05/07/19 0420  NA 143 143 145  K 5.0 4.7 4.0  CL 110 109 114*  CO2 25  --  22  GLUCOSE 100* 99 105*  BUN 56* 50* 44*  CREATININE 0.67 0.70 0.73  CALCIUM 9.1  --  8.6*  GFRNONAA >60  --  >60  GFRAA >60  --  >60  ANIONGAP 8  --  9    Recent Labs  Lab 05/06/19 1320 05/07/19 0420  PROT 6.4* 5.3*  ALBUMIN 4.0 3.3*  AST 21 18  ALT 23 19  ALKPHOS 72 53  BILITOT 0.8 1.1   Hematology Recent Labs  Lab 05/06/19 1320 05/06/19 1320 05/06/19 1338 05/06/19 1856 05/07/19 0420  WBC 12.5*  --   --  14.2* 10.7*  RBC 4.13  --   --  3.79* 3.50*  HGB 13.2   < > 12.9 12.2 11.0*  HCT 40.4   < > 38.0 37.5 34.1*  MCV 97.8  --   --  98.9 97.4  MCH 32.0  --   --  32.2 31.4  MCHC 32.7  --   --  32.5 32.3  RDW 12.9  --   --  13.0 13.2  PLT 199  --   --  181 171   < > = values in this interval not displayed.    BNPNo results for input(s): BNP, PROBNP in the last 168 hours.  DDimer No results for input(s): DDIMER in the last 168 hours.   Radiology/Studies:  DG ABD ACUTE 2+V W 1V CHEST  Result Date: 05/06/2019 CLINICAL DATA:  Vomiting and abdominal pain with GI bleeding EXAM: DG ABDOMEN ACUTE W/ 1V CHEST COMPARISON:  None. FINDINGS: Cardiac shadow is at the upper limits of normal in size. Loop recorder is noted aortic calcifications are seen. The lungs are mildly hyperinflated without focal infiltrate or sizable effusion. Scattered large and small bowel gas is noted. No abnormal mass or abnormal calcifications are seen. No free air is noted. Degenerative changes of lumbar spine are seen. IMPRESSION: No acute abnormality noted. Electronically Signed   By: Elta Guadeloupe  Lukens M.D.   On: 05/06/2019 14:29      Assessment and Plan:   GIB - hematosis and melena on presention x 2 days.  - Fecal occult blood positive, Hgb 12.  - patient was taking Eiquis and meloxicam>>both held - IVF and IV protonix - GI consulted>>endoscopy today showed abnormality in the mid esophagus, non-bleeding gastric ulcers. Plan to continue with IV PPI x 48 more hours. Suspect this is due to increased meloxicam use.   Persistent Afib now with RVR - patient has history of afib on Eliquis. Eliquis held in the setting of the above - Rates improved with IV metoprolol. Re-start PO when able - CHADSVASC= 4 (agex2, female, CHF, HTN) This patients CHA2DS2-VASc Score and unadjusted Ischemic Stroke Rate (% per year) is equal to 4.8 % stroke rate/year from a score of 4 Above score calculated as 1 point each if present [CHF, HTN, DM, Vascular=MI/PAD/Aortic Plaque, Age if 65-74, or Female], 2 points each if present [Age > 75, or Stroke/TIA/TE] - GI recommend re-starting Eliquis in 72 hours (1 week ideal). Can consider IV heparin in 12 hours without bolus if absolutely necessary. Will discuss with MD.   Chronic diastolic HF - takes lasix 20 mg  daily and Lopresor 25 mg BID at baseline. Held on admission. Continue when able - patient appears compensated  Hypertension - amlodipine held on admission   For questions or updates, please contact [place] Please consult [email or web address] for contact info under     Signed, Promise Weldin Ninfa Meeker, PA-C  05/07/2019 10:46 AM

## 2019-05-07 NOTE — Interval H&P Note (Signed)
History and Physical Interval Note:  05/07/2019 7:25 AM  Selena [name] Began  has presented today for surgery, with the diagnosis of UGI bleed, melena, hematemesis.  The various methods of treatment have been discussed with the patient and family. After consideration of risks, benefits and other options for treatment, the patient has consented to  Procedure(s): ESOPHAGOGASTRODUODENOSCOPY (EGD) WITH PROPOFOL (N/A) as a surgical intervention.  The patient's history has been reviewed, patient examined, no change in status, stable for surgery.  I have reviewed the patient's chart and labs.  Questions were answered to the patient's satisfaction.     [employer]

## 2019-05-08 ENCOUNTER — Ambulatory Visit: Payer: Medicare Other | Admitting: Physical Therapy

## 2019-05-08 ENCOUNTER — Other Ambulatory Visit: Payer: Self-pay

## 2019-05-08 ENCOUNTER — Encounter: Payer: Self-pay | Admitting: *Deleted

## 2019-05-08 ENCOUNTER — Inpatient Hospital Stay (HOSPITAL_COMMUNITY): Payer: Medicare Other

## 2019-05-08 DIAGNOSIS — K250 Acute gastric ulcer with hemorrhage: Principal | ICD-10-CM

## 2019-05-08 DIAGNOSIS — I480 Paroxysmal atrial fibrillation: Secondary | ICD-10-CM | POA: Diagnosis not present

## 2019-05-08 DIAGNOSIS — K921 Melena: Secondary | ICD-10-CM

## 2019-05-08 DIAGNOSIS — K922 Gastrointestinal hemorrhage, unspecified: Secondary | ICD-10-CM

## 2019-05-08 LAB — CBC
HCT: 32.8 % — ABNORMAL LOW (ref 36.0–46.0)
Hemoglobin: 10.5 g/dL — ABNORMAL LOW (ref 12.0–15.0)
MCH: 32.2 pg (ref 26.0–34.0)
MCHC: 32 g/dL (ref 30.0–36.0)
MCV: 100.6 fL — ABNORMAL HIGH (ref 80.0–100.0)
Platelets: 148 10*3/uL — ABNORMAL LOW (ref 150–400)
RBC: 3.26 MIL/uL — ABNORMAL LOW (ref 3.87–5.11)
RDW: 13.2 % (ref 11.5–15.5)
WBC: 8.4 10*3/uL (ref 4.0–10.5)
nRBC: 0 % (ref 0.0–0.2)

## 2019-05-08 LAB — BASIC METABOLIC PANEL
Anion gap: 9 (ref 5–15)
BUN: 21 mg/dL (ref 8–23)
CO2: 20 mmol/L — ABNORMAL LOW (ref 22–32)
Calcium: 8.3 mg/dL — ABNORMAL LOW (ref 8.9–10.3)
Chloride: 113 mmol/L — ABNORMAL HIGH (ref 98–111)
Creatinine, Ser: 0.66 mg/dL (ref 0.44–1.00)
GFR calc Af Amer: >60 mL/min (ref >60)
GFR calc non Af Amer: >60 mL/min (ref >60)
Glucose, Bld: 93 mg/dL (ref 70–99)
Potassium: 3.9 mmol/L (ref 3.5–5.1)
Sodium: 142 mmol/L (ref 135–145)

## 2019-05-08 LAB — SURGICAL PATHOLOGY

## 2019-05-08 MED ORDER — HYDROXYZINE HCL 10 MG PO TABS
10.0000 mg | ORAL_TABLET | Freq: Three times a day (TID) | ORAL | Status: DC | PRN
  Administered 2019-05-08 – 2019-05-09 (×2): 10 mg via ORAL
  Filled 2019-05-08 (×4): qty 1

## 2019-05-08 MED ORDER — SODIUM CHLORIDE (PF) 0.9 % IJ SOLN
INTRAMUSCULAR | Status: AC
  Filled 2019-05-08: qty 50

## 2019-05-08 MED ORDER — IOHEXOL 300 MG/ML  SOLN
100.0000 mL | Freq: Once | INTRAMUSCULAR | Status: AC | PRN
  Administered 2019-05-08: 100 mL via INTRAVENOUS

## 2019-05-08 MED ORDER — HYDROCORTISONE 0.5 % EX CREA
TOPICAL_CREAM | Freq: Two times a day (BID) | CUTANEOUS | Status: DC | PRN
  Filled 2019-05-08: qty 28.35

## 2019-05-08 MED ORDER — FUROSEMIDE 20 MG PO TABS
20.0000 mg | ORAL_TABLET | Freq: Every day | ORAL | Status: DC
  Administered 2019-05-08 – 2019-05-10 (×3): 20 mg via ORAL
  Filled 2019-05-08 (×3): qty 1

## 2019-05-08 NOTE — Progress Notes (Signed)
Cardiology Progress Note  Patient ID: Selena [name] MRN: [id number] DOB: 04-04-1935 Date of Encounter: 05/08/2019  Primary Cardiologist: Lauree Chandler, MD  Subjective  No complaints this AM. Reports her heart was racing but review of tele shows rate controlled Afib. Atypical chest pain related to GERD likely.   ROS:  All other ROS reviewed and negative. Pertinent positives noted in the HPI.     Inpatient Medications  Scheduled Meds: . metoprolol tartrate  25 mg Oral BID  . [START ON 05/10/2019] pantoprazole  40 mg Intravenous Q12H  . sodium chloride (PF)       Continuous Infusions: . sodium chloride 100 mL/hr at 05/07/19 2350  . lactated ringers 10 mL/hr at 05/07/19 0701  . pantoprozole (PROTONIX) infusion 8 mg/hr (05/08/19 1034)   PRN Meds: acetaminophen **OR** acetaminophen, hydrocortisone cream, hydrOXYzine, meclizine, morphine injection, ondansetron **OR** ondansetron (ZOFRAN) IV   Vital Signs   Vitals:   05/07/19 1209 05/07/19 1313 05/07/19 2109 05/08/19 0459  BP: 139/76 118/68 (!) 116/55 128/68  Pulse: 95 70 69 70  Resp:  18 20 16   Temp:  98.7 F (37.1 C) 98.1 F (36.7 C) 97.9 F (36.6 C)  TempSrc:  Oral Oral Oral  SpO2:  98% 99% 98%  Weight:      Height:        Intake/Output Summary (Last 24 hours) at 05/08/2019 1326 Last data filed at 05/08/2019 0500 Gross per 24 hour  Intake 2922 ml  Output --  Net 2922 ml   Last 3 Weights 05/07/2019 05/06/2019 05/06/2019  Weight (lbs) 205 lb 205 lb 206 lb 2 oz  Weight (kg) 92.987 kg 92.987 kg 93.498 kg      Telemetry  Overnight telemetry shows Afib with rates in the 70-90 bpm, which I personally reviewed.   ECG  The most recent ECG shows Afib with RVR 117 bpm, which I personally reviewed.   Physical Exam   Vitals:   05/07/19 1209 05/07/19 1313 05/07/19 2109 05/08/19 0459  BP: 139/76 118/68 (!) 116/55 128/68  Pulse: 95 70 69 70  Resp:  18 20 16   Temp:  98.7 F (37.1 C) 98.1 F (36.7 C) 97.9 F  (36.6 C)  TempSrc:  Oral Oral Oral  SpO2:  98% 99% 98%  Weight:      Height:         Intake/Output Summary (Last 24 hours) at 05/08/2019 1326 Last data filed at 05/08/2019 0500 Gross per 24 hour  Intake 2922 ml  Output --  Net 2922 ml    Last 3 Weights 05/07/2019 05/06/2019 05/06/2019  Weight (lbs) 205 lb 205 lb 206 lb 2 oz  Weight (kg) 92.987 kg 92.987 kg 93.498 kg    Body mass index is 32.11 kg/m.   General: Well nourished, well developed, in no acute distress Head: Atraumatic, normal size  Eyes: PEERLA, EOMI  Neck: Supple, no JVD Endocrine: No thryomegaly Cardiac: irregular rhythm, no m/r/g Lungs: Clear to auscultation bilaterally, no wheezing, rhonchi or rales  Abd: Soft, nontender, no hepatomegaly  Ext: No edema, pulses 2+ Musculoskeletal: No deformities, BUE and BLE strength normal and equal Skin: Warm and dry, no rashes   Neuro: Alert and oriented to person, place, time, and situation, CNII-XII grossly intact, no focal deficits  Psych: Normal mood and affect   Labs  High Sensitivity Troponin:  No results for input(s): TROPONINIHS in the last 720 hours.   Cardiac EnzymesNo results for input(s): TROPONINI in the last 168 hours. No results for  input(s): TROPIPOC in the last 168 hours.  Chemistry Recent Labs  Lab 05/06/19 1320 05/06/19 1320 05/06/19 1338 05/07/19 0420 05/08/19 0604  NA 143   < > 143 145 142  K 5.0   < > 4.7 4.0 3.9  CL 110   < > 109 114* 113*  CO2 25  --   --  22 20*  GLUCOSE 100*   < > 99 105* 93  BUN 56*   < > 50* 44* 21  CREATININE 0.67   < > 0.70 0.73 0.66  CALCIUM 9.1  --   --  8.6* 8.3*  PROT 6.4*  --   --  5.3*  --   ALBUMIN 4.0  --   --  3.3*  --   AST 21  --   --  18  --   ALT 23  --   --  19  --   ALKPHOS 72  --   --  53  --   BILITOT 0.8  --   --  1.1  --   GFRNONAA >60  --   --  >60 >60  GFRAA >60  --   --  >60 >60  ANIONGAP 8  --   --  9 9   < > = values in this interval not displayed.    Hematology Recent Labs  Lab  05/07/19 1045 05/07/19 1923 05/08/19 0604  WBC 10.5 9.5 8.4  RBC 3.72* 3.25* 3.26*  HGB 11.9* 10.4* 10.5*  HCT 35.4* 31.9* 32.8*  MCV 95.2 98.2 100.6*  MCH 32.0 32.0 32.2  MCHC 33.6 32.6 32.0  RDW 13.2 13.2 13.2  PLT PLATELET CLUMPS NOTED ON SMEAR, UNABLE TO ESTIMATE 168 148*   BNPNo results for input(s): BNP, PROBNP in the last 168 hours.  DDimer No results for input(s): DDIMER in the last 168 hours.   Radiology  CT CHEST W CONTRAST  Result Date: 05/08/2019 CLINICAL DATA:  Esophageal mass. EXAM: CT CHEST AND ABDOMEN WITH CONTRAST TECHNIQUE: Multidetector CT imaging of the chest and abdomen was performed following the standard protocol during bolus administration of intravenous contrast. CONTRAST:  173mL OMNIPAQUE IOHEXOL 300 MG/ML  SOLN COMPARISON:  None. FINDINGS: CT CHEST FINDINGS Cardiovascular: The heart is mildly enlarged. No pericardial effusion. There is mild tortuosity, ectasia and calcification of the thoracic aorta. No focal aneurysm or dissection. Three-vessel coronary artery calcifications are noted. The pulmonary arteries are unremarkable. Mediastinum/Nodes: No mediastinal or hilar mass or lymphadenopathy. Small scattered lymph nodes are noted. The esophagus is grossly normal. I do not see an obvious esophageal mass. There is a small hiatal hernia. Enlarged multinodular thyroid goiter is noted. This has previously been evaluated with and followed by ultrasound. Lungs/Pleura: Chronic bronchitic type interstitial changes and areas of pulmonary scarring. No infiltrates or effusions. No worrisome pulmonary lesions or pulmonary nodules. Musculoskeletal: Skeletal surgical changes from a right mastectomy with a right breast prosthesis in place. No chest wall mass, supraclavicular or axillary adenopathy. The left breast is unremarkable. The bony thorax is intact. No worrisome bone lesions. Advanced degenerative changes involving the spine. CT ABDOMEN FINDINGS Hepatobiliary: There is a  stable right hepatic lobe cyst. No worrisome hepatic lesions or intrahepatic biliary dilatation. Layering high attenuation material in the gallbladder dependently is likely sludge or stones. No findings for acute cholecystitis. No common bile duct dilatation. Pancreas: No mass, inflammation or ductal dilatation. Spleen: Normal size. No focal lesions. Adrenals/Urinary Tract: The adrenal glands are normal. No worrisome renal lesions or hydronephrosis.  No significant collecting system abnormalities. Small renal cysts are noted. Stomach/Bowel: The stomach, duodenum, visualized small bowel and visualized colon are unremarkable. No acute inflammatory changes, mass lesions or obstructive findings. Descending colon diverticulosis is noted. Vascular/Lymphatic: Advanced atherosclerotic calcifications involving the abdominal aorta but no aneurysm or dissection. The branch vessels are patent. The major venous structures are patent. No gastrohepatic ligament or celiac axis adenopathy. There is a 10.5 mm left-sided retroperitoneal lymph node. This was also present on prior CT scan from 2016 and measures 16 mm at that time. Other: No ascites or abdominal wall hernia. Musculoskeletal: No significant bony findings. Moderate degenerative changes involving the spine. IMPRESSION: 1. Surgical changes from a right mastectomy with a right breast prosthesis in place. No findings for recurrent chest wall tumor or axillary adenopathy. 2. Small hiatal hernia. No esophageal mass is identified. No paraesophageal or mediastinal or upper abdominal lymphadenopathy. 3. No findings for metastatic disease involving the chest or abdomen. 4. 10.5 mm left-sided retroperitoneal lymph node, also present on prior CT scan from 2016. 5. Advanced atherosclerotic calcifications involving the thoracic and abdominal aorta and branch vessels including three-vessel coronary artery calcifications. 6. Enlarged multinodular thyroid goiter, previously evaluated and  followed by ultrasound. 7. Aortic atherosclerosis. Aortic Atherosclerosis (ICD10-I70.0). Electronically Signed   By: Marijo Sanes M.D.   On: 05/08/2019 11:40   CT ABDOMEN W CONTRAST  Result Date: 05/08/2019 CLINICAL DATA:  Esophageal mass. EXAM: CT CHEST AND ABDOMEN WITH CONTRAST TECHNIQUE: Multidetector CT imaging of the chest and abdomen was performed following the standard protocol during bolus administration of intravenous contrast. CONTRAST:  145mL OMNIPAQUE IOHEXOL 300 MG/ML  SOLN COMPARISON:  None. FINDINGS: CT CHEST FINDINGS Cardiovascular: The heart is mildly enlarged. No pericardial effusion. There is mild tortuosity, ectasia and calcification of the thoracic aorta. No focal aneurysm or dissection. Three-vessel coronary artery calcifications are noted. The pulmonary arteries are unremarkable. Mediastinum/Nodes: No mediastinal or hilar mass or lymphadenopathy. Small scattered lymph nodes are noted. The esophagus is grossly normal. I do not see an obvious esophageal mass. There is a small hiatal hernia. Enlarged multinodular thyroid goiter is noted. This has previously been evaluated with and followed by ultrasound. Lungs/Pleura: Chronic bronchitic type interstitial changes and areas of pulmonary scarring. No infiltrates or effusions. No worrisome pulmonary lesions or pulmonary nodules. Musculoskeletal: Skeletal surgical changes from a right mastectomy with a right breast prosthesis in place. No chest wall mass, supraclavicular or axillary adenopathy. The left breast is unremarkable. The bony thorax is intact. No worrisome bone lesions. Advanced degenerative changes involving the spine. CT ABDOMEN FINDINGS Hepatobiliary: There is a stable right hepatic lobe cyst. No worrisome hepatic lesions or intrahepatic biliary dilatation. Layering high attenuation material in the gallbladder dependently is likely sludge or stones. No findings for acute cholecystitis. No common bile duct dilatation. Pancreas: No  mass, inflammation or ductal dilatation. Spleen: Normal size. No focal lesions. Adrenals/Urinary Tract: The adrenal glands are normal. No worrisome renal lesions or hydronephrosis. No significant collecting system abnormalities. Small renal cysts are noted. Stomach/Bowel: The stomach, duodenum, visualized small bowel and visualized colon are unremarkable. No acute inflammatory changes, mass lesions or obstructive findings. Descending colon diverticulosis is noted. Vascular/Lymphatic: Advanced atherosclerotic calcifications involving the abdominal aorta but no aneurysm or dissection. The branch vessels are patent. The major venous structures are patent. No gastrohepatic ligament or celiac axis adenopathy. There is a 10.5 mm left-sided retroperitoneal lymph node. This was also present on prior CT scan from 2016 and measures 16 mm at that  time. Other: No ascites or abdominal wall hernia. Musculoskeletal: No significant bony findings. Moderate degenerative changes involving the spine. IMPRESSION: 1. Surgical changes from a right mastectomy with a right breast prosthesis in place. No findings for recurrent chest wall tumor or axillary adenopathy. 2. Small hiatal hernia. No esophageal mass is identified. No paraesophageal or mediastinal or upper abdominal lymphadenopathy. 3. No findings for metastatic disease involving the chest or abdomen. 4. 10.5 mm left-sided retroperitoneal lymph node, also present on prior CT scan from 2016. 5. Advanced atherosclerotic calcifications involving the thoracic and abdominal aorta and branch vessels including three-vessel coronary artery calcifications. 6. Enlarged multinodular thyroid goiter, previously evaluated and followed by ultrasound. 7. Aortic atherosclerosis. Aortic Atherosclerosis (ICD10-I70.0). Electronically Signed   By: Marijo Sanes M.D.   On: 05/08/2019 11:40   DG ABD ACUTE 2+V W 1V CHEST  Result Date: 05/06/2019 CLINICAL DATA:  Vomiting and abdominal pain with GI  bleeding EXAM: DG ABDOMEN ACUTE W/ 1V CHEST COMPARISON:  None. FINDINGS: Cardiac shadow is at the upper limits of normal in size. Loop recorder is noted aortic calcifications are seen. The lungs are mildly hyperinflated without focal infiltrate or sizable effusion. Scattered large and small bowel gas is noted. No abnormal mass or abnormal calcifications are seen. No free air is noted. Degenerative changes of lumbar spine are seen. IMPRESSION: No acute abnormality noted. Electronically Signed   By: Inez Catalina M.D.   On: 05/06/2019 14:29   CUP PACEART REMOTE DEVICE CHECK  Result Date: 05/07/2019 [facility] summary report received. Battery status OK. Normal device function. No new symptom episodes, tachy episodes, brady, or pause episodes. AF burden 92%, stable trend; ventricular rates controlled; on Eliquis. Monthly summary reports and ROV/PRN [facility]    Patient Profile  83 yo F with paroxysmal Afib on eliquis, HTN, non-hodgkin lymphoma admitted 4/12 with hematemesis. Found to have numerous gastric ulcers. Cardiology consulted regarding anticoagulation management.   Assessment & Plan   Chest Tightness/Atypical CP -related to GERD probably. Review of tele shows no RVR during her episode. No further work-up needed.   Atrial Fibrillation, persistent -in Afib and was noted to be in Afib 11/2018 by EP -CHADSVASC = 4 (age, sex, HTN, CHF) -restart anticoagulation as dictated by Gastroenterology. Would like her bleeding risk as low as possible prior to restarting  -would continue metoprolol tartrate 25 mg BID and titrate up as needed for rate control  -she has no need for aspirin   HFpEF 55-60% -no volume overload on exam -continue home lasix 20 mg QD   [facility] will sign off.   Medication Recommendations:  Metoprolol tartrate 25 mg BID, resume anticoagulation per Gastroenterology to ensure low bleeding risk, lasix 20 mg QD  Other recommendations (labs, testing, etc):  none Follow up as an  outpatient:  We will arrange outpatient follow-up  For questions or updates, please contact Joaquin Please consult [email or web address] for contact info under   Time Spent with Patient: I have spent a total of 25 minutes with patient reviewing hospital notes, telemetry, EKGs, labs and examining the patient as well as establishing an assessment and plan that was discussed with the patient.  > 50% of time was spent in direct patient care.    Signed, Addison Naegeli. Audie Box, [place]  05/08/2019 1:26 PM

## 2019-05-08 NOTE — Progress Notes (Signed)
ILR Remote 

## 2019-05-08 NOTE — Progress Notes (Signed)
[facility]  PROGRESS NOTE  Selena [name] [id number] DOB: 07/02/1935 DOA: 05/06/2019 PCP: Caren Macadam, MD   Brief HPI:   84 year old female with past medical history of atrial fibrillation on Eliquis, hypertension, diverticulosis, follicular non-Hodgkin lymphoma who presented with chief complaint of vomiting bright red 1 cupful of blood prior to admission.  She also reported black tarry stool on the morning of admission.  Patient has been on Eliquis.  She also has been taking meloxicam 2 pills 3 days prior to admission after she injured her ankle.  GI was consulted patient underwent endoscopy which showed small area of extrinsic compression and mild mucosal abnormality in the mid esophagus.  Multiple gastric ulcers.   Subjective   Patient seen and examined, denies any complaints   Assessment/Plan:     1. GI bleed-patient presented with hematemesis and melena.  She has been on anticoagulation with Eliquis.  Eliquis was held.  Patient underwent upper GI endoscopy which showed multiple gastric ulcers, small area of extrinsic compression and mild diffuse abnormality in the midesophagus.  CT chest abdomen and pelvis was ordered to evaluate extrinsic compression of the esophagus.  Cardiology was consulted and as per cardiology okay to hold Eliquis for 1 week as per GI recommendation.  Continue Protonix 40 mg IV every 12 hours. 2. Anemia-secondary to GI bleed as above.  Hemoglobin is stable 10.5.  Follow CBC in a.m. 3. A. fib with RVR-continue p.o. metoprolol, Eliquis on hold as above. 4. Chronic diastolic CHF-appears compensated.  Chest x-ray is negative for pulmonary edema. 5. Hypertension-hold Norvasc in the setting of GI bleed. 6. Osteoarthritis-meloxicam on hold due to GI bleed.    SpO2: 100 % O2 Flow Rate (L/min): 5 L/min   COVID-19 Labs  No results for input(s): DDIMER, FERRITIN, LDH, CRP in the last 72 hours.  Lab Results  Component Value Date   [place]  NEGATIVE 05/06/2019     CBG: No results for input(s): GLUCAP in the last 168 hours.  CBC: Recent Labs  Lab 05/06/19 1320 05/06/19 1338 05/06/19 1856 05/07/19 0420 05/07/19 1045 05/07/19 1923 05/08/19 0604  WBC 12.5*   < > 14.2* 10.7* 10.5 9.5 8.4  NEUTROABS 9.3*  --   --   --   --   --   --   HGB 13.2   < > 12.2 11.0* 11.9* 10.4* 10.5*  HCT 40.4   < > 37.5 34.1* 35.4* 31.9* 32.8*  MCV 97.8   < > 98.9 97.4 95.2 98.2 100.6*  PLT 199   < > 181 171 PLATELET CLUMPS NOTED ON SMEAR, UNABLE TO ESTIMATE 168 148*   < > = values in this interval not displayed.    Basic Metabolic Panel: Recent Labs  Lab 05/06/19 1320 05/06/19 1338 05/07/19 0420 05/08/19 0604  NA 143 143 145 142  K 5.0 4.7 4.0 3.9  CL 110 109 114* 113*  CO2 25  --  22 20*  GLUCOSE 100* 99 105* 93  BUN 56* 50* 44* 21  CREATININE 0.67 0.70 0.73 0.66  CALCIUM 9.1  --  8.6* 8.3*     Liver Function Tests: Recent Labs  Lab 05/06/19 1320 05/07/19 0420  AST 21 18  ALT 23 19  ALKPHOS 72 53  BILITOT 0.8 1.1  PROT 6.4* 5.3*  ALBUMIN 4.0 3.3*     DVT prophylaxis: SCDs  Code Status: Full code  Family Communication: No family at bedside  Disposition Plan: Patient presented with hematemesis, underwent EGD.  GI  recommends to continue with IV Protonix.  Barrier to discharge-continuing IV PPI as per GI recommendation.     Scheduled medications:  . furosemide  20 mg Oral Daily  . metoprolol tartrate  25 mg Oral BID  . [START ON 05/10/2019] pantoprazole  40 mg Intravenous Q12H  . sodium chloride (PF)        Consultants:  Cardiology  Gastroenterology  Procedures:  EGD  Antibiotics:   Anti-infectives (From admission, onward)   None       Objective   Vitals:   05/07/19 1313 05/07/19 2109 05/08/19 0459 05/08/19 1419  BP: 118/68 (!) 116/55 128/68 112/62  Pulse: 70 69 70 65  Resp: 18 20 16  (!) 21  Temp: 98.7 F (37.1 C) 98.1 F (36.7 C) 97.9 F (36.6 C) 98.4 F (36.9 C)  TempSrc: Oral  Oral Oral Oral  SpO2: 98% 99% 98% 100%  Weight:      Height:        Intake/Output Summary (Last 24 hours) at 05/08/2019 1732 Last data filed at 05/08/2019 1523 Gross per 24 hour  Intake 2425.75 ml  Output --  Net 2425.75 ml    04/12 1901 - 04/14 0700 In: [id number] [P.O.:600; I.V.:2942] Out: 1 [Urine:1]  Filed Weights   05/06/19 1306 05/07/19 0654  Weight: 93 kg 93 kg    Physical Examination:    General-appears in no acute distress  Heart-S1-S2, regular, no murmur auscultated  Lungs-clear to auscultation bilaterally, no wheezing or crackles auscultated  Abdomen-soft, nontender, no organomegaly  Extremities-no edema in the lower extremities  Neuro-alert, oriented x3, no focal deficit noted    Data Reviewed:   Recent Results (from the past 240 hour(s))  SARS CORONAVIRUS 2 (TAT 6-24 HRS) Nasopharyngeal Nasopharyngeal Swab     Status: None   Collection Time: 05/06/19  4:38 PM   Specimen: Nasopharyngeal Swab  Result Value Ref Range Status   SARS Coronavirus 2 NEGATIVE NEGATIVE Final    Comment: (NOTE) SARS-CoV-2 target nucleic acids are NOT DETECTED. The SARS-CoV-2 RNA is generally detectable in upper and lower respiratory specimens during the acute phase of infection. Negative results do not preclude SARS-CoV-2 infection, do not rule out co-infections with other pathogens, and should not be used as the sole basis for treatment or other patient management decisions. Negative results must be combined with clinical observations, patient history, and epidemiological information. The expected result is Negative. Fact Sheet for Patients: SugarRoll.be Fact Sheet for Healthcare Providers: https://www.woods-mathews.com/ This test is not yet approved or cleared by the [place] FDA and  has been authorized for detection and/or diagnosis of SARS-CoV-2 by FDA under an Emergency Use Authorization (EUA). This EUA will remain  in  effect (meaning this test can be used) for the duration of the COVID-19 declaration under Section 56 4(b)(1) of the Act, 21 U.S.C. section 360bbb-3(b)(1), unless the authorization is terminated or revoked sooner. Performed at [facility], [place] [address], [place], [place] [address]     No results for input(s): LIPASE, AMYLASE in the last 168 hours. No results for input(s): AMMONIA in the last 168 hours.  Cardiac Enzymes: No results for input(s): CKTOTAL, CKMB, CKMBINDEX, TROPONINI in the last 168 hours. BNP (last 3 results) No results for input(s): BNP in the last 8760 hours.  ProBNP (last 3 results) Recent Labs    07/17/18 1319  PROBNP 720    Studies:  CT CHEST W CONTRAST  Result Date: 05/08/2019 CLINICAL DATA:  Esophageal mass. EXAM: CT CHEST AND  ABDOMEN WITH CONTRAST TECHNIQUE: Multidetector CT imaging of the chest and abdomen was performed following the standard protocol during bolus administration of intravenous contrast. CONTRAST:  184mL OMNIPAQUE IOHEXOL 300 MG/ML  SOLN COMPARISON:  None. FINDINGS: CT CHEST FINDINGS Cardiovascular: The heart is mildly enlarged. No pericardial effusion. There is mild tortuosity, ectasia and calcification of the thoracic aorta. No focal aneurysm or dissection. Three-vessel coronary artery calcifications are noted. The pulmonary arteries are unremarkable. Mediastinum/Nodes: No mediastinal or hilar mass or lymphadenopathy. Small scattered lymph nodes are noted. The esophagus is grossly normal. I do not see an obvious esophageal mass. There is a small hiatal hernia. Enlarged multinodular thyroid goiter is noted. This has previously been evaluated with and followed by ultrasound. Lungs/Pleura: Chronic bronchitic type interstitial changes and areas of pulmonary scarring. No infiltrates or effusions. No worrisome pulmonary lesions or pulmonary nodules. Musculoskeletal: Skeletal surgical changes from a right mastectomy with a right breast prosthesis in  place. No chest wall mass, supraclavicular or axillary adenopathy. The left breast is unremarkable. The bony thorax is intact. No worrisome bone lesions. Advanced degenerative changes involving the spine. CT ABDOMEN FINDINGS Hepatobiliary: There is a stable right hepatic lobe cyst. No worrisome hepatic lesions or intrahepatic biliary dilatation. Layering high attenuation material in the gallbladder dependently is likely sludge or stones. No findings for acute cholecystitis. No common bile duct dilatation. Pancreas: No mass, inflammation or ductal dilatation. Spleen: Normal size. No focal lesions. Adrenals/Urinary Tract: The adrenal glands are normal. No worrisome renal lesions or hydronephrosis. No significant collecting system abnormalities. Small renal cysts are noted. Stomach/Bowel: The stomach, duodenum, visualized small bowel and visualized colon are unremarkable. No acute inflammatory changes, mass lesions or obstructive findings. Descending colon diverticulosis is noted. Vascular/Lymphatic: Advanced atherosclerotic calcifications involving the abdominal aorta but no aneurysm or dissection. The branch vessels are patent. The major venous structures are patent. No gastrohepatic ligament or celiac axis adenopathy. There is a 10.5 mm left-sided retroperitoneal lymph node. This was also present on prior CT scan from 2016 and measures 16 mm at that time. Other: No ascites or abdominal wall hernia. Musculoskeletal: No significant bony findings. Moderate degenerative changes involving the spine. IMPRESSION: 1. Surgical changes from a right mastectomy with a right breast prosthesis in place. No findings for recurrent chest wall tumor or axillary adenopathy. 2. Small hiatal hernia. No esophageal mass is identified. No paraesophageal or mediastinal or upper abdominal lymphadenopathy. 3. No findings for metastatic disease involving the chest or abdomen. 4. 10.5 mm left-sided retroperitoneal lymph node, also present on  prior CT scan from 2016. 5. Advanced atherosclerotic calcifications involving the thoracic and abdominal aorta and branch vessels including three-vessel coronary artery calcifications. 6. Enlarged multinodular thyroid goiter, previously evaluated and followed by ultrasound. 7. Aortic atherosclerosis. Aortic Atherosclerosis (ICD10-I70.0). Electronically Signed   By: Marijo Sanes M.D.   On: 05/08/2019 11:40   CT ABDOMEN W CONTRAST  Result Date: 05/08/2019 CLINICAL DATA:  Esophageal mass. EXAM: CT CHEST AND ABDOMEN WITH CONTRAST TECHNIQUE: Multidetector CT imaging of the chest and abdomen was performed following the standard protocol during bolus administration of intravenous contrast. CONTRAST:  132mL OMNIPAQUE IOHEXOL 300 MG/ML  SOLN COMPARISON:  None. FINDINGS: CT CHEST FINDINGS Cardiovascular: The heart is mildly enlarged. No pericardial effusion. There is mild tortuosity, ectasia and calcification of the thoracic aorta. No focal aneurysm or dissection. Three-vessel coronary artery calcifications are noted. The pulmonary arteries are unremarkable. Mediastinum/Nodes: No mediastinal or hilar mass or lymphadenopathy. Small scattered lymph nodes are noted. The esophagus  is grossly normal. I do not see an obvious esophageal mass. There is a small hiatal hernia. Enlarged multinodular thyroid goiter is noted. This has previously been evaluated with and followed by ultrasound. Lungs/Pleura: Chronic bronchitic type interstitial changes and areas of pulmonary scarring. No infiltrates or effusions. No worrisome pulmonary lesions or pulmonary nodules. Musculoskeletal: Skeletal surgical changes from a right mastectomy with a right breast prosthesis in place. No chest wall mass, supraclavicular or axillary adenopathy. The left breast is unremarkable. The bony thorax is intact. No worrisome bone lesions. Advanced degenerative changes involving the spine. CT ABDOMEN FINDINGS Hepatobiliary: There is a stable right hepatic lobe  cyst. No worrisome hepatic lesions or intrahepatic biliary dilatation. Layering high attenuation material in the gallbladder dependently is likely sludge or stones. No findings for acute cholecystitis. No common bile duct dilatation. Pancreas: No mass, inflammation or ductal dilatation. Spleen: Normal size. No focal lesions. Adrenals/Urinary Tract: The adrenal glands are normal. No worrisome renal lesions or hydronephrosis. No significant collecting system abnormalities. Small renal cysts are noted. Stomach/Bowel: The stomach, duodenum, visualized small bowel and visualized colon are unremarkable. No acute inflammatory changes, mass lesions or obstructive findings. Descending colon diverticulosis is noted. Vascular/Lymphatic: Advanced atherosclerotic calcifications involving the abdominal aorta but no aneurysm or dissection. The branch vessels are patent. The major venous structures are patent. No gastrohepatic ligament or celiac axis adenopathy. There is a 10.5 mm left-sided retroperitoneal lymph node. This was also present on prior CT scan from 2016 and measures 16 mm at that time. Other: No ascites or abdominal wall hernia. Musculoskeletal: No significant bony findings. Moderate degenerative changes involving the spine. IMPRESSION: 1. Surgical changes from a right mastectomy with a right breast prosthesis in place. No findings for recurrent chest wall tumor or axillary adenopathy. 2. Small hiatal hernia. No esophageal mass is identified. No paraesophageal or mediastinal or upper abdominal lymphadenopathy. 3. No findings for metastatic disease involving the chest or abdomen. 4. 10.5 mm left-sided retroperitoneal lymph node, also present on prior CT scan from 2016. 5. Advanced atherosclerotic calcifications involving the thoracic and abdominal aorta and branch vessels including three-vessel coronary artery calcifications. 6. Enlarged multinodular thyroid goiter, previously evaluated and followed by ultrasound. 7.  Aortic atherosclerosis. Aortic Atherosclerosis (ICD10-I70.0). Electronically Signed   By: Marijo Sanes M.D.   On: 05/08/2019 11:40   CUP PACEART REMOTE DEVICE CHECK  Result Date: 05/07/2019 Carelink summary report received. Battery status OK. Normal device function. No new symptom episodes, tachy episodes, brady, or pause episodes. AF burden 92%, stable trend; ventricular rates controlled; on Eliquis. Monthly summary reports and ROV/PRN [facility]    Admission status: The appropriate admission status for this patient is INPATIENT. Inpatient status is judged to be reasonable and necessary in order to provide the required intensity of service to ensure the patient's safety. The patient's presenting symptoms, physical exam findings, and initial radiographic and laboratory data in the context of their chronic comorbidities is felt to place them at high risk for further clinical deterioration. Furthermore, it is not anticipated that the patient will be medically stable for discharge from the hospital within 2 midnights of admission. The following factors support the admission status of inpatient.    The patient's presenting symptoms include hematemesis The worrisome physical exam findings include GI bleed The initial radiographic and laboratory data are worrisome because of GI bleed The chronic co-morbidities include atrial fibrillation    * I certify that at the point of admission it is my clinical judgment that the patient will  require inpatient hospital care spanning beyond 2 midnights from the point of admission due to high intensity of service, high risk for further deterioration and high frequency of surveillance required.Oswald Hillock   [facility] If 7PM-7AM, please contact night-coverage at [email or web address], Office  [phone]   05/08/2019, 5:32 PM  LOS: 1 day

## 2019-05-08 NOTE — Progress Notes (Signed)
[facility] Progress Note  CC:  Hematochezia,melena  Subjective:  She had fluttering in her chest with associated chest tightness last night for about 1 hour then again this am for 15 minutes. No current chest pain/tightness, fluttering or SOB. She ate soft breakfast this am. She passed a moderate sized soft black stool x 1 this am. Family at bed side.    Objective:   S/P EGD 05/07/2019:  - Extrinsic impression with mild mucosal abnormality noted in the mid esophagus. Biopsied. - No other gross lesions in esophagus. - Widely patent and non-obstructing Schatzki ring. - 3 cm hiatal hernia. - Non-bleeding gastric ulcer with a small flat spot (Forrest Class IIc) was noted in the fundal region coming off of the region of the diaphragmatic hiatus region. This could be an ulcer on a subepithelial lesion such as a Leiomyoma or GIST, but did not want to tunnel biopsy today based on her clinical picture and status. - Many non-bleeding gastric ulcers and erosions with a clean ulcer base (Forrest Class III) noted. Erythematous mucosa in the stomach. Biopsied for HP. - No gross lesions in the duodenal bulb, in the first portion of the duodenum and in the second portion of the duodenum.   Vital signs in last 24 hours: Temp:  [97.9 F (36.6 C)-98.7 F (37.1 C)] 97.9 F (36.6 C) (04/14 0459) Pulse Rate:  [69-95] 70 (04/14 0459) Resp:  [16-20] 16 (04/14 0459) BP: (116-139)/(55-76) 128/68 (04/14 0459) SpO2:  [98 %-99 %] 98 % (04/14 0459) Last BM Date: 05/06/19   General:   Alert,  Well-developed, in NAD Heart: Irregular rhythm, no murmur. Pulm:  Breath sounds clear throughout.  Abdomen: Soft, mild epigastric tenderness without rebound or guarding. + BS x 4 quads. Midline lower abdominal scar intact.  Extremities:  Without edema. Neurologic:  Alert and  oriented x4;  grossly normal neurologically. Psych:  Alert and cooperative. Normal mood and affect.  Intake/Output from  previous day: 04/13 0701 - 04/14 0700 In: 3422 [P.O.:480; I.V.:2942] Out: -  Intake/Output this shift: No intake/output data recorded.  Lab Results: Recent Labs    05/07/19 1045 05/07/19 1923 05/08/19 0604  WBC 10.5 9.5 8.4  HGB 11.9* 10.4* 10.5*  HCT 35.4* 31.9* 32.8*  PLT PLATELET CLUMPS NOTED ON SMEAR, UNABLE TO ESTIMATE 168 148*   BMET Recent Labs    05/06/19 1320 05/06/19 1320 05/06/19 1338 05/07/19 0420 05/08/19 0604  NA 143   < > 143 145 142  K 5.0   < > 4.7 4.0 3.9  CL 110   < > 109 114* 113*  CO2 25  --   --  22 20*  GLUCOSE 100*   < > 99 105* 93  BUN 56*   < > 50* 44* 21  CREATININE 0.67   < > 0.70 0.73 0.66  CALCIUM 9.1  --   --  8.6* 8.3*   < > = values in this interval not displayed.   LFT Recent Labs    05/07/19 0420  PROT 5.3*  ALBUMIN 3.3*  AST 18  ALT 19  ALKPHOS 53  BILITOT 1.1   PT/INR Recent Labs    05/06/19 1320  LABPROT 14.2  INR 1.1   Hepatitis Panel No results for input(s): HEPBSAG, HCVAB, HEPAIGM, HEPBIGM in the last 72 hours.  DG ABD ACUTE 2+V W 1V CHEST  Result Date: 05/06/2019 CLINICAL DATA:  Vomiting and abdominal pain with GI bleeding EXAM: DG ABDOMEN ACUTE W/ 1V CHEST  COMPARISON:  None. FINDINGS: Cardiac shadow is at the upper limits of normal in size. Loop recorder is noted aortic calcifications are seen. The lungs are mildly hyperinflated without focal infiltrate or sizable effusion. Scattered large and small bowel gas is noted. No abnormal mass or abnormal calcifications are seen. No free air is noted. Degenerative changes of lumbar spine are seen. IMPRESSION: No acute abnormality noted. Electronically Signed   By: Inez Catalina M.D.   On: 05/06/2019 14:29   CUP PACEART REMOTE DEVICE CHECK  Result Date: 05/07/2019 Carelink summary report received. Battery status OK. Normal device function. No new symptom episodes, tachy episodes, brady, or pause episodes. AF burden 92%, stable trend; ventricular rates controlled; on  Eliquis. Monthly summary reports and ROV/PRN [facility]   Assessment / Plan:  63. 84 year old female with a history of atrial fibrillation on Eliquis + Meloxicam 7.5mg  QD x 20 years and last week took  Meloxicam bid x 3 days, occasional Advil use presents to the ED 4/12 with UGI bleed/hematemesis and melena. Hg 13.2 (baseline Hg 14.6 on 12/13/2018). BUN 56 (baseline BUN 24). Received Protonix 80mg  IV load and 8mg /hr infusion started in the ED. S/P EGD 4/13 identified extrinsic impression with mild mucosal abnormality noted in the mid esophagus, widely patent non-obstructing Schatzki's ring, 3 cmm hiatal hernia, non-bleeding gastric ulcer with a small flat spot which could be an ulcer on a subepithelial lesion such as a Leiomyoma or GIST, many non-bleeding gastric ulcers and erosions. Today Hg stable at 10.5. She passed one moderate sized soft melenic stool this morning.  -CT-CAP with IV contrast to evaluate the 2 areas of potential extrinsic impression vs subepithelial lesions has been ordered    by Dr. Tyrell Antonio.  -Note: Pantoprazole gtt was not infusing when I assessed patient this morning. I contacted the patient's RN and pharmacist  Leodis Sias. Pantoprazole 8mg /hr infusion to continue ASAP. Continue IV Pantoprazole 8mg /hr infusion x 48 hours then  transition to PPI po bid thereafter -No NSAID use, if Aspirin medically necessary then may be ok -Hold Eliquis for another 72 hours if not for 1 full week. If absolutely necessary ok to restart hepatin gtt without bolus with  close inpatient observation.  -Repeat H/H at 12pm today  -Nursing staff to call our service if patient has further melenic stools  2. Atrial fibrillation. Patient reported having heart fluttering with chest tightness last night which lasted for about 1 hour and again this morning which lasted 15 minutes. No current chest pain of "fluttering".  No SOB. K+ 3.9.  -Consider cardiac consult, defer to hospitalist. -Patient to contact RN if  she has recurrence of chest fluttering/tightness -See anticoagulation recommendations in plan # 1  Further recommendations per Dr. Rush Landmark           Active Problems:   ATRIAL FIBRILLATION   Osteoarthritis   Essential hypertension   Chronic diastolic CHF (congestive heart failure) ([place])   History of atrioventricular nodal ablation   GI bleed     LOS: 1 day   Noralyn Pick  05/08/2019, 9:24 AM

## 2019-05-09 ENCOUNTER — Encounter: Payer: Self-pay | Admitting: Gastroenterology

## 2019-05-09 DIAGNOSIS — Z791 Long term (current) use of non-steroidal anti-inflammatories (NSAID): Secondary | ICD-10-CM

## 2019-05-09 DIAGNOSIS — I10 Essential (primary) hypertension: Secondary | ICD-10-CM

## 2019-05-09 DIAGNOSIS — K279 Peptic ulcer, site unspecified, unspecified as acute or chronic, without hemorrhage or perforation: Secondary | ICD-10-CM

## 2019-05-09 DIAGNOSIS — I5032 Chronic diastolic (congestive) heart failure: Secondary | ICD-10-CM

## 2019-05-09 LAB — CBC
HCT: 31 % — ABNORMAL LOW (ref 36.0–46.0)
Hemoglobin: 10.7 g/dL — ABNORMAL LOW (ref 12.0–15.0)
MCH: 33 pg (ref 26.0–34.0)
MCHC: 34.5 g/dL (ref 30.0–36.0)
MCV: 95.7 fL (ref 80.0–100.0)
Platelets: 171 10*3/uL (ref 150–400)
RBC: 3.24 MIL/uL — ABNORMAL LOW (ref 3.87–5.11)
RDW: 13.2 % (ref 11.5–15.5)
WBC: 7.8 10*3/uL (ref 4.0–10.5)
nRBC: 0 % (ref 0.0–0.2)

## 2019-05-09 LAB — TSH: TSH: 0.526 u[IU]/mL (ref 0.350–4.500)

## 2019-05-09 LAB — T4, FREE: Free T4: 0.99 ng/dL (ref 0.61–1.12)

## 2019-05-09 MED ORDER — PANTOPRAZOLE SODIUM 40 MG IV SOLR
40.0000 mg | Freq: Two times a day (BID) | INTRAVENOUS | Status: DC
  Administered 2019-05-09 – 2019-05-10 (×3): 40 mg via INTRAVENOUS
  Filled 2019-05-09 (×4): qty 40

## 2019-05-09 NOTE — Progress Notes (Signed)
[facility]  PROGRESS NOTE  Shivangi Boddicker [id number] DOB: [birth year] DOA: 05/06/2019 PCP: Caren Macadam, MD   Brief HPI:   84 year old female with past medical history of atrial fibrillation on Eliquis, hypertension, diverticulosis, follicular non-Hodgkin lymphoma who presented with chief complaint of vomiting bright red 1 cupful of blood prior to admission.  She also reported black tarry stool on the morning of admission.  Patient has been on Eliquis.  She also has been taking meloxicam 2 pills 3 days prior to admission after she injured her ankle.  GI was consulted patient underwent endoscopy which showed small area of extrinsic compression and mild mucosal abnormality in the mid esophagus.  Multiple gastric ulcers.   Subjective   Patient seen and examined, denies any complaints.  CT chest with contrast shows multinodular goiter.   Assessment/Plan:     1. GI bleed-patient presented with hematemesis and melena.  She has been on anticoagulation with Eliquis.  Eliquis was held.  Patient underwent upper GI endoscopy which showed multiple gastric ulcers, small area of extrinsic compression and mild diffuse abnormality in the midesophagus.  CT chest abdomen and pelvis was ordered to evaluate extrinsic compression of the esophagus, which did not show significant abnormality.  Multinodular goiter seen on CT scan.  Cardiology was consulted and as per cardiology okay to hold Eliquis for 1 week as per GI recommendation.  Continue Protonix 40 mg IV every 12 hours. 2. Multinodular goiter-patient had ultrasound of thyroid in 2007 which revealed multinodular goiter.  Will obtain TSH and free T4.  Patient can follow-up with her PCP regarding goiter as outpatient.   3. Anemia-secondary to GI bleed as above.  Hemoglobin is stable 10.7.  Follow CBC in a.m. 4. A. fib with RVR-continue p.o. metoprolol, Eliquis on hold as above. 5. Chronic diastolic CHF-appears compensated.  Chest x-ray is  negative for pulmonary edema. 6. Hypertension-hold Norvasc in the setting of GI bleed. 7. Osteoarthritis-meloxicam on hold due to GI bleed.    SpO2: 97 % O2 Flow Rate (L/min): 5 L/min   COVID-19 Labs  No results for input(s): DDIMER, FERRITIN, LDH, CRP in the last 72 hours.  Lab Results  Component Value Date   [place] NEGATIVE 05/06/2019     CBG: No results for input(s): GLUCAP in the last 168 hours.  CBC: Recent Labs  Lab 05/06/19 1320 05/06/19 1338 05/07/19 0420 05/07/19 1045 05/07/19 1923 05/08/19 0604 05/09/19 0602  WBC 12.5*   < > 10.7* 10.5 9.5 8.4 7.8  NEUTROABS 9.3*  --   --   --   --   --   --   HGB 13.2   < > 11.0* 11.9* 10.4* 10.5* 10.7*  HCT 40.4   < > 34.1* 35.4* 31.9* 32.8* 31.0*  MCV 97.8   < > 97.4 95.2 98.2 100.6* 95.7  PLT 199   < > 171 PLATELET CLUMPS NOTED ON SMEAR, UNABLE TO ESTIMATE 168 148* 171   < > = values in this interval not displayed.    Basic Metabolic Panel: Recent Labs  Lab 05/06/19 1320 05/06/19 1338 05/07/19 0420 05/08/19 0604  NA 143 143 145 142  K 5.0 4.7 4.0 3.9  CL 110 109 114* 113*  CO2 25  --  22 20*  GLUCOSE 100* 99 105* 93  BUN 56* 50* 44* 21  CREATININE 0.67 0.70 0.73 0.66  CALCIUM 9.1  --  8.6* 8.3*     Liver Function Tests: Recent Labs  Lab 05/06/19 1320 05/07/19 0420  AST 21 18  ALT 23 19  ALKPHOS 72 53  BILITOT 0.8 1.1  PROT 6.4* 5.3*  ALBUMIN 4.0 3.3*     DVT prophylaxis: SCDs  Code Status: Full code  Family Communication: No family at bedside  Disposition Plan: Patient presented with hematemesis, underwent EGD.  GI recommends to continue with IV Protonix.  Barrier to discharge-continuing IV PPI as per GI recommendation.     Scheduled medications:  . furosemide  20 mg Oral Daily  . metoprolol tartrate  25 mg Oral BID  . pantoprazole  40 mg Intravenous Q12H    Consultants:  Cardiology  Gastroenterology  Procedures:  EGD  Antibiotics:   Anti-infectives (From admission,  onward)   None       Objective   Vitals:   05/08/19 1419 05/08/19 2006 05/09/19 0433 05/09/19 1402  BP: 112/62 110/66 126/64 115/63  Pulse: 65 77 72 60  Resp: (!) 21 20 15 20   Temp: 98.4 F (36.9 C) 97.8 F (36.6 C) 97.9 F (36.6 C) 98.2 F (36.8 C)  TempSrc: Oral Oral Oral   SpO2: 100% 97% 96% 97%  Weight:      Height:        Intake/Output Summary (Last 24 hours) at 05/09/2019 1611 Last data filed at 05/09/2019 1108 Gross per 24 hour  Intake 1374.49 ml  Output --  Net 1374.49 ml    04/13 1901 - 04/15 0700 In: 3092.2 [P.O.:360; I.V.:2732.2] Out: -   Filed Weights   05/06/19 1306 05/07/19 0654  Weight: 93 kg 93 kg    Physical Examination:    General-appears in no acute distress  Heart-S1-S2, regular, no murmur auscultated  Lungs-clear to auscultation bilaterally, no wheezing or crackles auscultated  Abdomen-soft, nontender, no organomegaly  Extremities-no edema in the lower extremities  Neuro-alert, oriented x3, no focal deficit noted MRI was consulted on order    Data Reviewed:   Recent Results (from the past 240 hour(s))  SARS CORONAVIRUS 2 (TAT 6-24 HRS) Nasopharyngeal Nasopharyngeal Swab     Status: None   Collection Time: 05/06/19  4:38 PM   Specimen: Nasopharyngeal Swab  Result Value Ref Range Status   SARS Coronavirus 2 NEGATIVE NEGATIVE Final    Comment: (NOTE) SARS-CoV-2 target nucleic acids are NOT DETECTED. The SARS-CoV-2 RNA is generally detectable in upper and lower respiratory specimens during the acute phase of infection. Negative results do not preclude SARS-CoV-2 infection, do not rule out co-infections with other pathogens, and should not be used as the sole basis for treatment or other patient management decisions. Negative results must be combined with clinical observations, patient history, and epidemiological information. The expected result is Negative. Fact Sheet for  Patients: SugarRoll.be Fact Sheet for Healthcare Providers: https://www.woods-mathews.com/ This test is not yet approved or cleared by the [place] FDA and  has been authorized for detection and/or diagnosis of SARS-CoV-2 by FDA under an Emergency Use Authorization (EUA). This EUA will remain  in effect (meaning this test can be used) for the duration of the COVID-19 declaration under Section 56 4(b)(1) of the Act, 21 U.S.C. section 360bbb-3(b)(1), unless the authorization is terminated or revoked sooner. Performed at [facility], [place] [address] [place], [place] [address]      ProBNP (last 3 results) Recent Labs    07/17/18 1319  PROBNP 720    Studies:  CT CHEST W CONTRAST  Result Date: 05/08/2019 CLINICAL DATA:  Esophageal mass. EXAM: CT CHEST AND ABDOMEN WITH CONTRAST TECHNIQUE: Multidetector CT  imaging of the chest and abdomen was performed following the standard protocol during bolus administration of intravenous contrast. CONTRAST:  168mL OMNIPAQUE IOHEXOL 300 MG/ML  SOLN COMPARISON:  None. FINDINGS: CT CHEST FINDINGS Cardiovascular: The heart is mildly enlarged. No pericardial effusion. There is mild tortuosity, ectasia and calcification of the thoracic aorta. No focal aneurysm or dissection. Three-vessel coronary artery calcifications are noted. The pulmonary arteries are unremarkable. Mediastinum/Nodes: No mediastinal or hilar mass or lymphadenopathy. Small scattered lymph nodes are noted. The esophagus is grossly normal. I do not see an obvious esophageal mass. There is a small hiatal hernia. Enlarged multinodular thyroid goiter is noted. This has previously been evaluated with and followed by ultrasound. Lungs/Pleura: Chronic bronchitic type interstitial changes and areas of pulmonary scarring. No infiltrates or effusions. No worrisome pulmonary lesions or pulmonary nodules. Musculoskeletal: Skeletal surgical changes from a  right mastectomy with a right breast prosthesis in place. No chest wall mass, supraclavicular or axillary adenopathy. The left breast is unremarkable. The bony thorax is intact. No worrisome bone lesions. Advanced degenerative changes involving the spine. CT ABDOMEN FINDINGS Hepatobiliary: There is a stable right hepatic lobe cyst. No worrisome hepatic lesions or intrahepatic biliary dilatation. Layering high attenuation material in the gallbladder dependently is likely sludge or stones. No findings for acute cholecystitis. No common bile duct dilatation. Pancreas: No mass, inflammation or ductal dilatation. Spleen: Normal size. No focal lesions. Adrenals/Urinary Tract: The adrenal glands are normal. No worrisome renal lesions or hydronephrosis. No significant collecting system abnormalities. Small renal cysts are noted. Stomach/Bowel: The stomach, duodenum, visualized small bowel and visualized colon are unremarkable. No acute inflammatory changes, mass lesions or obstructive findings. Descending colon diverticulosis is noted. Vascular/Lymphatic: Advanced atherosclerotic calcifications involving the abdominal aorta but no aneurysm or dissection. The branch vessels are patent. The major venous structures are patent. No gastrohepatic ligament or celiac axis adenopathy. There is a 10.5 mm left-sided retroperitoneal lymph node. This was also present on prior CT scan from 2016 and measures 16 mm at that time. Other: No ascites or abdominal wall hernia. Musculoskeletal: No significant bony findings. Moderate degenerative changes involving the spine. IMPRESSION: 1. Surgical changes from a right mastectomy with a right breast prosthesis in place. No findings for recurrent chest wall tumor or axillary adenopathy. 2. Small hiatal hernia. No esophageal mass is identified. No paraesophageal or mediastinal or upper abdominal lymphadenopathy. 3. No findings for metastatic disease involving the chest or abdomen. 4. 10.5 mm  left-sided retroperitoneal lymph node, also present on prior CT scan from 2016. 5. Advanced atherosclerotic calcifications involving the thoracic and abdominal aorta and branch vessels including three-vessel coronary artery calcifications. 6. Enlarged multinodular thyroid goiter, previously evaluated and followed by ultrasound. 7. Aortic atherosclerosis. Aortic Atherosclerosis (ICD10-I70.0). Electronically Signed   By: Marijo Sanes M.D.   On: 05/08/2019 11:40   CT ABDOMEN W CONTRAST  Result Date: 05/08/2019 CLINICAL DATA:  Esophageal mass. EXAM: CT CHEST AND ABDOMEN WITH CONTRAST TECHNIQUE: Multidetector CT imaging of the chest and abdomen was performed following the standard protocol during bolus administration of intravenous contrast. CONTRAST:  116mL OMNIPAQUE IOHEXOL 300 MG/ML  SOLN COMPARISON:  None. FINDINGS: CT CHEST FINDINGS Cardiovascular: The heart is mildly enlarged. No pericardial effusion. There is mild tortuosity, ectasia and calcification of the thoracic aorta. No focal aneurysm or dissection. Three-vessel coronary artery calcifications are noted. The pulmonary arteries are unremarkable. Mediastinum/Nodes: No mediastinal or hilar mass or lymphadenopathy. Small scattered lymph nodes are noted. The esophagus is grossly normal. I do not  see an obvious esophageal mass. There is a small hiatal hernia. Enlarged multinodular thyroid goiter is noted. This has previously been evaluated with and followed by ultrasound. Lungs/Pleura: Chronic bronchitic type interstitial changes and areas of pulmonary scarring. No infiltrates or effusions. No worrisome pulmonary lesions or pulmonary nodules. Musculoskeletal: Skeletal surgical changes from a right mastectomy with a right breast prosthesis in place. No chest wall mass, supraclavicular or axillary adenopathy. The left breast is unremarkable. The bony thorax is intact. No worrisome bone lesions. Advanced degenerative changes involving the spine. CT ABDOMEN  FINDINGS Hepatobiliary: There is a stable right hepatic lobe cyst. No worrisome hepatic lesions or intrahepatic biliary dilatation. Layering high attenuation material in the gallbladder dependently is likely sludge or stones. No findings for acute cholecystitis. No common bile duct dilatation. Pancreas: No mass, inflammation or ductal dilatation. Spleen: Normal size. No focal lesions. Adrenals/Urinary Tract: The adrenal glands are normal. No worrisome renal lesions or hydronephrosis. No significant collecting system abnormalities. Small renal cysts are noted. Stomach/Bowel: The stomach, duodenum, visualized small bowel and visualized colon are unremarkable. No acute inflammatory changes, mass lesions or obstructive findings. Descending colon diverticulosis is noted. Vascular/Lymphatic: Advanced atherosclerotic calcifications involving the abdominal aorta but no aneurysm or dissection. The branch vessels are patent. The major venous structures are patent. No gastrohepatic ligament or celiac axis adenopathy. There is a 10.5 mm left-sided retroperitoneal lymph node. This was also present on prior CT scan from 2016 and measures 16 mm at that time. Other: No ascites or abdominal wall hernia. Musculoskeletal: No significant bony findings. Moderate degenerative changes involving the spine. IMPRESSION: 1. Surgical changes from a right mastectomy with a right breast prosthesis in place. No findings for recurrent chest wall tumor or axillary adenopathy. 2. Small hiatal hernia. No esophageal mass is identified. No paraesophageal or mediastinal or upper abdominal lymphadenopathy. 3. No findings for metastatic disease involving the chest or abdomen. 4. 10.5 mm left-sided retroperitoneal lymph node, also present on prior CT scan from 2016. 5. Advanced atherosclerotic calcifications involving the thoracic and abdominal aorta and branch vessels including three-vessel coronary artery calcifications. 6. Enlarged multinodular thyroid  goiter, previously evaluated and followed by ultrasound. 7. Aortic atherosclerosis. Aortic Atherosclerosis (ICD10-I70.0). Electronically Signed   By: Marijo Sanes M.D.   On: 05/08/2019 11:40     Admission status: The appropriate admission status for this patient is INPATIENT. Inpatient status is judged to be reasonable and necessary in order to provide the required intensity of service to ensure the patient's safety. The patient's presenting symptoms, physical exam findings, and initial radiographic and laboratory data in the context of their chronic comorbidities is felt to place them at high risk for further clinical deterioration. Furthermore, it is not anticipated that the patient will be medically stable for discharge from the hospital within 2 midnights of admission. The following factors support the admission status of inpatient.    The patient's presenting symptoms include hematemesis The worrisome physical exam findings include GI bleed The initial radiographic and laboratory data are worrisome because of GI bleed The chronic co-morbidities include atrial fibrillation    * I certify that at the point of admission it is my clinical judgment that the patient will require inpatient hospital care spanning beyond 2 midnights from the point of admission due to high intensity of service, high risk for further deterioration and high frequency of surveillance required.Oswald Hillock   [facility] If 7PM-7AM, please contact night-coverage at [email or web address], Office  [phone]   05/09/2019,  4:11 PM  LOS: 2 days

## 2019-05-09 NOTE — Progress Notes (Signed)
Progress Note    ASSESSMENT AND PLAN:   84 yo female with pmh significant for but not limited to HTN, CHF, AFIB on Eliquis  CC: Hematochezia and melena  # Upper GI bleed on Eliquis and NSAIDS / PUD --No further bleeding --EGD this admission showed multiple non-bleeding gastric erosions and gastric ulcers. Biopsies c/w chronic active gastritis. No H.pylori. One ulcer in fundus raised concern for GIST or leiomyoma. Tunnel biopsies not done. Chest CT scan unrevealing.  --Abnormal area in esophagus with ? Extrinsic compression.but no esophageal findings on chest CT scan and esophageal biopsy was normal.  --Transitioned to BID IV PPI today. Will need to remain on BID until seen in office.  --No NSAIDs going forward --Would like to hold off on resuming blood thinners for 3 days at least. Seven days would be preferable. --She didn't receive copy of EGD report and wants one. Printed it for her.    ABL anemia --Hgb stable at 10.7, baseline hgb is 13-14. No transfusions have been required.   # Multinodular goiter    SUBJECTIVE   Feels okay. BM yesterday with tiny amount of black blood   OBJECTIVE:    EGD 05/07/19  Extrinsic impression with mild mucosal abnormality noted in the mid esophagus. Biopsied. - No other gross lesions in esophagus. - Widely patent and non-obstructing Schatzki ring. - 3 cm hiatal hernia. - Non-bleeding gastric ulcer with a small flat spot (Forrest Class IIc) was noted in the fundal region coming off of the region of the diaphragmatic hiatus region. This could be an ulcer on a subepithelial lesion such as a Leiomyoma or GIST, but did not want to tunnel biopsy today based on her clinical picture and status. - Many non-bleeding gastric ulcers and erosions with a clean ulcer base (Forrest Class III) noted. Erythematous mucosa in the stomach. Biopsied for HP. - No gross lesions in the duodenal bulb, in the first portion of the duodenum and in the second  portion of the duodenum.  The patient will be observed post-procedure, until all discharge criteria are met. - Return patient to hospital ward for ongoing care. - Advance diet as tolerated. - Observe patient's clinical course. - Recommend IV PPI x 72 hours today (48 more hours at minimum). Will plan transition to PO PPI BID thereafter and eventually remain on indefinite PPI therapy. - No more NSAID use, although if Aspirin is needed or indicated may be OK. - Recommend CT-CAP with IV contrast when able in next 24 hours to help understand the 2 areas of potential extrinsic impression vs Subepithelial lesions. - Patient was not on PPI prior to hospitalization and it is entirely possible that her increased Meloxicam use likely contributed to the ulcerations. Bleeding from any and all of the lesions likely. If the lesion in the fundus is a subepithelial lesion, it could be a GIST. It may heal mucosally over time with acid suppression. It may not and could be a nidus for recurrent bleeding. Will need to decide with family based on how she is doing as to next steps.   FINAL MICROSCOPIC DIAGNOSIS:   A. STOMACH, BIOPSY:  - Chronic active gastritis.  - There is no evidence of Helicobacter pylori, dysplasia, or malignancy.  - See comment.   B. ESOPHAGUS, LESION, AT 22 CM, BIOPSY:  - Benign squamous mucosa.  - There is no evidence of dysplasia or malignancy.  - See comment.   COMMENT:   A. A Warthin-Starry stain is negative  for the presence of Helicobacter  pylori organisms.   B. No "lesion" is histologically identified.    GROSS DESCRIPTION:   Specimen A: Received in formalin are tan, soft tissue fragments that are  submitted in toto. Number: 6 size: 0.2 to 0.7 cm. Blocks: 1   Specimen B: Received in formalin are gray-white soft tissue fragments  that are submitted in toto. Number: 4 size: 0.1 to 0.5 cm. Blocks: 1   SW 05/07/2019   Vital signs in last 24 hours: Temp:  [97.8 F  (36.6 C)-98.4 F (36.9 C)] 97.9 F (36.6 C) (04/15 0433) Pulse Rate:  [65-77] 72 (04/15 0433) Resp:  [15-21] 15 (04/15 0433) BP: (110-126)/(62-66) 126/64 (04/15 0433) SpO2:  [96 %-100 %] 96 % (04/15 0433) Last BM Date: 05/08/19 General:   Alert, well-developed female in NAD Heart:  Regular rate and rhythm;  No lower extremity edema   Pulm: Normal respiratory effort   Abdomen:  Soft, nondistended, mild diffuse tender.  Normal bowel sounds.      Neurologic:  Alert and  oriented x4;  grossly normal neurologically. Psych:  Pleasant, cooperative.  Normal mood and affect.   Intake/Output from previous day: 04/14 0701 - 04/15 0700 In: 1608.9 [P.O.:240; I.V.:1368.9] Out: -  Intake/Output this shift: No intake/output data recorded.  Lab Results: Recent Labs    05/07/19 1923 05/08/19 0604 05/09/19 0602  WBC 9.5 8.4 7.8  HGB 10.4* 10.5* 10.7*  HCT 31.9* 32.8* 31.0*  PLT 168 148* 171   BMET Recent Labs    05/06/19 1320 05/06/19 1320 05/06/19 1338 05/07/19 0420 05/08/19 0604  NA 143   < > 143 145 142  K 5.0   < > 4.7 4.0 3.9  CL 110   < > 109 114* 113*  CO2 25  --   --  22 20*  GLUCOSE 100*   < > 99 105* 93  BUN 56*   < > 50* 44* 21  CREATININE 0.67   < > 0.70 0.73 0.66  CALCIUM 9.1  --   --  8.6* 8.3*   < > = values in this interval not displayed.   LFT Recent Labs    05/07/19 0420  PROT 5.3*  ALBUMIN 3.3*  AST 18  ALT 19  ALKPHOS 53  BILITOT 1.1   PT/INR Recent Labs    05/06/19 1320  LABPROT 14.2  INR 1.1   Hepatitis Panel No results for input(s): HEPBSAG, HCVAB, HEPAIGM, HEPBIGM in the last 72 hours.  CT CHEST W CONTRAST  Result Date: 05/08/2019 CLINICAL DATA:  Esophageal mass. EXAM: CT CHEST AND ABDOMEN WITH CONTRAST TECHNIQUE: Multidetector CT imaging of the chest and abdomen was performed following the standard protocol during bolus administration of intravenous contrast. CONTRAST:  143m OMNIPAQUE IOHEXOL 300 MG/ML  SOLN COMPARISON:  None.  FINDINGS: CT CHEST FINDINGS Cardiovascular: The heart is mildly enlarged. No pericardial effusion. There is mild tortuosity, ectasia and calcification of the thoracic aorta. No focal aneurysm or dissection. Three-vessel coronary artery calcifications are noted. The pulmonary arteries are unremarkable. Mediastinum/Nodes: No mediastinal or hilar mass or lymphadenopathy. Small scattered lymph nodes are noted. The esophagus is grossly normal. I do not see an obvious esophageal mass. There is a small hiatal hernia. Enlarged multinodular thyroid goiter is noted. This has previously been evaluated with and followed by ultrasound. Lungs/Pleura: Chronic bronchitic type interstitial changes and areas of pulmonary scarring. No infiltrates or effusions. No worrisome pulmonary lesions or pulmonary nodules. Musculoskeletal: Skeletal surgical changes from a  right mastectomy with a right breast prosthesis in place. No chest wall mass, supraclavicular or axillary adenopathy. The left breast is unremarkable. The bony thorax is intact. No worrisome bone lesions. Advanced degenerative changes involving the spine. CT ABDOMEN FINDINGS Hepatobiliary: There is a stable right hepatic lobe cyst. No worrisome hepatic lesions or intrahepatic biliary dilatation. Layering high attenuation material in the gallbladder dependently is likely sludge or stones. No findings for acute cholecystitis. No common bile duct dilatation. Pancreas: No mass, inflammation or ductal dilatation. Spleen: Normal size. No focal lesions. Adrenals/Urinary Tract: The adrenal glands are normal. No worrisome renal lesions or hydronephrosis. No significant collecting system abnormalities. Small renal cysts are noted. Stomach/Bowel: The stomach, duodenum, visualized small bowel and visualized colon are unremarkable. No acute inflammatory changes, mass lesions or obstructive findings. Descending colon diverticulosis is noted. Vascular/Lymphatic: Advanced atherosclerotic  calcifications involving the abdominal aorta but no aneurysm or dissection. The branch vessels are patent. The major venous structures are patent. No gastrohepatic ligament or celiac axis adenopathy. There is a 10.5 mm left-sided retroperitoneal lymph node. This was also present on prior CT scan from 2016 and measures 16 mm at that time. Other: No ascites or abdominal wall hernia. Musculoskeletal: No significant bony findings. Moderate degenerative changes involving the spine. IMPRESSION: 1. Surgical changes from a right mastectomy with a right breast prosthesis in place. No findings for recurrent chest wall tumor or axillary adenopathy. 2. Small hiatal hernia. No esophageal mass is identified. No paraesophageal or mediastinal or upper abdominal lymphadenopathy. 3. No findings for metastatic disease involving the chest or abdomen. 4. 10.5 mm left-sided retroperitoneal lymph node, also present on prior CT scan from 2016. 5. Advanced atherosclerotic calcifications involving the thoracic and abdominal aorta and branch vessels including three-vessel coronary artery calcifications. 6. Enlarged multinodular thyroid goiter, previously evaluated and followed by ultrasound. 7. Aortic atherosclerosis. Aortic Atherosclerosis (ICD10-I70.0). Electronically Signed   By: Marijo Sanes M.D.   On: 05/08/2019 11:40   CT ABDOMEN W CONTRAST  Result Date: 05/08/2019 CLINICAL DATA:  Esophageal mass. EXAM: CT CHEST AND ABDOMEN WITH CONTRAST TECHNIQUE: Multidetector CT imaging of the chest and abdomen was performed following the standard protocol during bolus administration of intravenous contrast. CONTRAST:  192m OMNIPAQUE IOHEXOL 300 MG/ML  SOLN COMPARISON:  None. FINDINGS: CT CHEST FINDINGS Cardiovascular: The heart is mildly enlarged. No pericardial effusion. There is mild tortuosity, ectasia and calcification of the thoracic aorta. No focal aneurysm or dissection. Three-vessel coronary artery calcifications are noted. The  pulmonary arteries are unremarkable. Mediastinum/Nodes: No mediastinal or hilar mass or lymphadenopathy. Small scattered lymph nodes are noted. The esophagus is grossly normal. I do not see an obvious esophageal mass. There is a small hiatal hernia. Enlarged multinodular thyroid goiter is noted. This has previously been evaluated with and followed by ultrasound. Lungs/Pleura: Chronic bronchitic type interstitial changes and areas of pulmonary scarring. No infiltrates or effusions. No worrisome pulmonary lesions or pulmonary nodules. Musculoskeletal: Skeletal surgical changes from a right mastectomy with a right breast prosthesis in place. No chest wall mass, supraclavicular or axillary adenopathy. The left breast is unremarkable. The bony thorax is intact. No worrisome bone lesions. Advanced degenerative changes involving the spine. CT ABDOMEN FINDINGS Hepatobiliary: There is a stable right hepatic lobe cyst. No worrisome hepatic lesions or intrahepatic biliary dilatation. Layering high attenuation material in the gallbladder dependently is likely sludge or stones. No findings for acute cholecystitis. No common bile duct dilatation. Pancreas: No mass, inflammation or ductal dilatation. Spleen: Normal size. No focal  lesions. Adrenals/Urinary Tract: The adrenal glands are normal. No worrisome renal lesions or hydronephrosis. No significant collecting system abnormalities. Small renal cysts are noted. Stomach/Bowel: The stomach, duodenum, visualized small bowel and visualized colon are unremarkable. No acute inflammatory changes, mass lesions or obstructive findings. Descending colon diverticulosis is noted. Vascular/Lymphatic: Advanced atherosclerotic calcifications involving the abdominal aorta but no aneurysm or dissection. The branch vessels are patent. The major venous structures are patent. No gastrohepatic ligament or celiac axis adenopathy. There is a 10.5 mm left-sided retroperitoneal lymph node. This was also  present on prior CT scan from 2016 and measures 16 mm at that time. Other: No ascites or abdominal wall hernia. Musculoskeletal: No significant bony findings. Moderate degenerative changes involving the spine. IMPRESSION: 1. Surgical changes from a right mastectomy with a right breast prosthesis in place. No findings for recurrent chest wall tumor or axillary adenopathy. 2. Small hiatal hernia. No esophageal mass is identified. No paraesophageal or mediastinal or upper abdominal lymphadenopathy. 3. No findings for metastatic disease involving the chest or abdomen. 4. 10.5 mm left-sided retroperitoneal lymph node, also present on prior CT scan from 2016. 5. Advanced atherosclerotic calcifications involving the thoracic and abdominal aorta and branch vessels including three-vessel coronary artery calcifications. 6. Enlarged multinodular thyroid goiter, previously evaluated and followed by ultrasound. 7. Aortic atherosclerosis. Aortic Atherosclerosis (ICD10-I70.0). Electronically Signed   By: Marijo Sanes M.D.   On: 05/08/2019 11:40     Active Problems:   ATRIAL FIBRILLATION   Osteoarthritis   Essential hypertension   Chronic diastolic CHF (congestive heart failure) ([name])   History of atrioventricular nodal ablation   GI bleed   Acute gastric ulcer with hemorrhage     LOS: 2 days   Tye Savoy ,NP 05/09/2019, 10:39 AM        3 days , pref a wek

## 2019-05-09 NOTE — Plan of Care (Signed)
  Problem: Clinical Measurements: Goal: Ability to maintain clinical measurements within normal limits will improve Outcome: Adequate for Discharge Goal: Will remain free from infection Outcome: Adequate for Discharge Goal: Diagnostic test results will improve Outcome: Adequate for Discharge   

## 2019-05-10 ENCOUNTER — Telehealth: Payer: Self-pay

## 2019-05-10 ENCOUNTER — Encounter: Payer: Medicare Other | Admitting: Physical Therapy

## 2019-05-10 DIAGNOSIS — K922 Gastrointestinal hemorrhage, unspecified: Secondary | ICD-10-CM

## 2019-05-10 MED ORDER — APIXABAN 5 MG PO TABS: 5.0000 mg | ORAL_TABLET | Freq: Two times a day (BID) | ORAL | Status: DC

## 2019-05-10 MED ORDER — FLUTICASONE PROPIONATE 50 MCG/ACT NA SUSP
2.0000 | Freq: Every day | NASAL | Status: DC
  Administered 2019-05-10: 2 via NASAL
  Filled 2019-05-10: qty 16

## 2019-05-10 MED ORDER — SODIUM CHLORIDE 0.9 % IV SOLN
510.0000 mg | Freq: Once | INTRAVENOUS | Status: AC
  Administered 2019-05-10: 510 mg via INTRAVENOUS
  Filled 2019-05-10: qty 510

## 2019-05-10 MED ORDER — GUAIFENESIN ER 600 MG PO TB12
600.0000 mg | ORAL_TABLET | Freq: Two times a day (BID) | ORAL | Status: DC
  Administered 2019-05-10: 600 mg via ORAL
  Filled 2019-05-10: qty 1

## 2019-05-10 MED ORDER — PANTOPRAZOLE SODIUM 40 MG PO TBEC: 40.0000 mg | DELAYED_RELEASE_TABLET | Freq: Two times a day (BID) | ORAL | 3 refills | Status: DC

## 2019-05-10 MED ORDER — ALPRAZOLAM 0.5 MG PO TABS
0.5000 mg | ORAL_TABLET | Freq: Once | ORAL | Status: AC
  Administered 2019-05-10: 0.5 mg via ORAL
  Filled 2019-05-10: qty 1

## 2019-05-10 MED ORDER — GUAIFENESIN ER 600 MG PO TB12: 600.0000 mg | ORAL_TABLET | Freq: Two times a day (BID) | ORAL | 0 refills | Status: AC

## 2019-05-10 MED ORDER — FLUTICASONE PROPIONATE 50 MCG/ACT NA SUSP: 2.0000 | Freq: Every day | NASAL | 2 refills | Status: DC

## 2019-05-10 NOTE — Telephone Encounter (Signed)
ROV scheduled for 5/14 with Colleen and labs prior to that appt entered.  Recall EGD entered as well. Letter with all information mailed to the pt.

## 2019-05-10 NOTE — Evaluation (Signed)
Physical Therapy Evaluation Patient Details Name: Selena [name] MRN: [id number] DOB: [birth year] Today's Date: 05/10/2019   History of Present Illness  84 year old female with past medical history of atrial fibrillation on Eliquis, hypertension, diverticulosis, follicular non-Hodgkin lymphoma who presented with chief complaint of vomiting BRB.GI bleed-patient presented with hematemesis and melena  Clinical Impression  Patient evaluated by Physical Therapy with no further acute PT needs identified. All education has been completed and the patient has no further questions.  Pt amb ~ 240', no AD, no LOB although mildly unsteady initial 50'.  Encouraged pt to incr acitvity daily, back up to her normal daily walking distance, using her cane as she does at baseline. dtr supportive and lives next door.  No further PT or DME needs.  See below for any follow-up Physical Therapy or equipment needs. PT is signing off. Thank you for this referral.     Follow Up Recommendations No PT follow up    Equipment Recommendations  None recommended by PT    Recommendations for Other Services       Precautions / Restrictions Precautions Precautions: Fall Restrictions Weight Bearing Restrictions: No      Mobility  Bed Mobility Overal bed mobility: Needs Assistance Bed Mobility: Supine to Sit     Supine to sit: Modified independent (Device/Increase time)        Transfers Overall transfer level: Needs assistance Equipment used: None Transfers: Sit to/from Stand Sit to Stand: Supervision         General transfer comment: for safety, no physical assist  Ambulation/Gait Ambulation/Gait assistance: Supervision;Min guard Gait Distance (Feet): 240 Feet Assistive device: None Gait Pattern/deviations: Step-through pattern;Decreased stride length;Drifts right/left Gait velocity: decr   General Gait Details: decr push off, mild unsteadiness which improved with distance, no overt  LOB  Stairs            Wheelchair Mobility    Modified Rankin (Stroke Patients Only)       Balance Overall balance assessment: Needs assistance;History of Falls(one fall possibly d/t vertigo per pt)   Sitting balance-Leahy Scale: Good       Standing balance-Leahy Scale: Fair Standing balance comment: fair to good, not tested to mod challenges             High level balance activites: Direction changes;Turns;Head turns High Level Balance Comments: no LOB with above, supervision for safety             Pertinent Vitals/Pain Pain Assessment: 0-10 Pain Score: 0-No pain Pain Intervention(s): Monitored during session;Premedicated before session    [place] expects to be discharged to:: Private residence Living Arrangements: Alone Available Help at Discharge: Family Type of Home: House Home Access: Stairs to enter   Technical brewer of Steps: 1 Home Layout: One level Home Equipment: Cane - single point;Walker - 2 wheels      Prior Function Level of Independence: Independent;Independent with assistive device(s)         Comments: amb with cane, walks the length of her driveway at least 1x per day     Hand Dominance        Extremity/Trunk Assessment   Upper Extremity Assessment Upper Extremity Assessment: Overall WFL for tasks assessed    Lower Extremity Assessment Lower Extremity Assessment: RLE deficits/detail RLE Deficits / Details: reports recent R ankle "ligament injury"       Communication   Communication: No difficulties  Cognition Arousal/Alertness: Awake/alert Behavior During Therapy: WFL for tasks assessed/performed Overall Cognitive Status: Within Functional  Limits for tasks assessed                                        General Comments General comments (skin integrity, edema, etc.): discussed use of appropriate footwear/importance of support with recent R ankle injury and arthritis at  baseline    Exercises     Assessment/Plan    PT Assessment Patent does not need any further PT services  PT Problem List         PT Treatment Interventions      PT Goals (Current goals can be found in the Care Plan section)  Acute Rehab PT Goals PT Goal Formulation: All assessment and education complete, DC therapy    Frequency     Barriers to discharge        Co-evaluation               AM-PAC PT "6 Clicks" Mobility  Outcome Measure Help needed turning from your back to your side while in a flat bed without using bedrails?: None Help needed moving from lying on your back to sitting on the side of a flat bed without using bedrails?: None Help needed moving to and from a bed to a chair (including a wheelchair)?: None Help needed standing up from a chair using your arms (e.g., wheelchair or bedside chair)?: None Help needed to walk in hospital room?: None Help needed climbing 3-5 steps with a railing? : A Little 6 Click Score: 23    End of Session Equipment Utilized During Treatment: Gait belt Activity Tolerance: Patient tolerated treatment well Patient left: with call bell/phone within reach;in chair;with family/visitor present   PT Visit Diagnosis: Unsteadiness on feet (R26.81)    Time: [id number] PT Time Calculation (min) (ACUTE ONLY): 21 min   Charges:   PT Evaluation $PT Eval Low Complexity: 1 Low          Zaccai Chavarin, PT   Acute Rehab Dept [facility]): [id number]   05/10/2019   [facility] 05/10/2019, 10:50 AM

## 2019-05-10 NOTE — Discharge Summary (Addendum)
Physician Discharge Summary  Selena [name] [id number] DOB: 1935-04-18 DOA: 05/06/2019  PCP: Caren Macadam, MD  Admit date: 05/06/2019 Discharge date: 05/10/2019  Time spent: 50 minutes  Recommendations for Outpatient Follow-up:  1. Follow-up cardiology in 1 month 2. Follow-up gastroenterology in 2 weeks   Discharge Diagnoses:  Active Problems:   ATRIAL FIBRILLATION   Osteoarthritis   Essential hypertension   Chronic diastolic CHF (congestive heart failure) ([name])   History of atrioventricular nodal ablation   GI bleed   Acute gastric ulcer with hemorrhage   Discharge Condition: Stable  Diet recommendation: Heart healthy diet  Filed Weights   05/06/19 1306 05/07/19 0654  Weight: 93 kg 93 kg    History of present illness:  84 year old female with past medical history of atrial fibrillation on Eliquis, hypertension, diverticulosis, follicular non-Hodgkin lymphoma who presented with chief complaint of vomiting bright red 1 cupful of blood prior to admission.  She also reported black tarry stool on the morning of admission.  Patient has been on Eliquis.  She also has been taking meloxicam 2 pills 3 days prior to admission after she injured her ankle.  GI was consulted patient underwent endoscopy which showed small area of extrinsic compression and mild mucosal abnormality in the mid esophagus.  Multiple gastric ulcers.   Hospital Course:   1. GI bleed-patient presented with hematemesis and melena.  She has been on anticoagulation with Eliquis.  Eliquis was held.  Patient underwent upper GI endoscopy which showed multiple gastric ulcers, small area of extrinsic compression and mild diffuse abnormality in the midesophagus.  CT chest abdomen and pelvis was ordered to evaluate extrinsic compression of the esophagus, which did not show significant abnormality.  Multinodular goiter seen on CT scan.  Cardiology was consulted and as per cardiology okay to hold Eliquis for 1  week as per GI recommendation.  Continue Protonix 40 mg p.o. twice daily.  Patient can restart taking Eliquis on 05/14/2019.  Patient to follow-up gastroenterology in 2 weeks, plan for repeat endoscopy in 2 to 3 months as outpatient. 2. Multinodular goiter-patient had ultrasound of thyroid in 2007 which revealed multinodular goiter.   TSH 0.526 and free T4 0.99.  Patient can follow-up with her PCP regarding goiter as outpatient.   3. Anemia-secondary to GI bleed as above.  Hemoglobin is stable 10.7.    Patient received IV iron before discharge today. 4. A. fib with RVR-continue p.o. metoprolol, Eliquis on hold for 1 week.  Restart taking on 05/14/2019. 5. Chronic diastolic CHF-appears compensated.  Chest x-ray is negative for pulmonary edema. 6. Hypertension-continue home medications. 7. Osteoarthritis-meloxicam on hold due to GI bleed.  Procedures:  EGD  Consultations:  Gastroenterology  Cardiology  Discharge Exam: Vitals:   05/10/19 0604 05/10/19 1004  BP: 131/70 131/76  Pulse: 75 80  Resp: 20   Temp:    SpO2: 98%     General: Appears in no acute distress Cardiovascular: S1-S2, regular Respiratory: Clear to auscultation bilaterally  Discharge Instructions   Discharge Instructions    Diet - low sodium heart healthy   Complete by: As directed    Discharge instructions   Complete by: As directed    Hold Eliquis till 05/13/19. Start taking from 05/14/19   Increase activity slowly   Complete by: As directed      Allergies as of 05/10/2019      Reactions   Codeine Phosphate Other (See Comments)   Hallucinations   Adhesive [tape]    Electrodes for EKG-  skin blistering, erythema   Codeine Other (See Comments)   Hallucinations  Other reaction(s): Other      Medication List    STOP taking these medications   doxycycline 100 MG capsule Commonly known as: VIBRAMYCIN     TAKE these medications   amLODipine 5 MG tablet Commonly known as: NORVASC TAKE 1 TABLET BY MOUTH  DAILY   apixaban 5 MG Tabs tablet Commonly known as: Eliquis Take 1 tablet (5 mg total) by mouth 2 (two) times daily. Start taking on: May 14, 2019 What changed: These instructions start on May 14, 2019. If you are unsure what to do until then, ask your doctor or other care provider.   benzonatate 100 MG capsule Commonly known as: Tessalon Perles Take 1 capsule (100 mg total) by mouth 2 (two) times daily as needed. What changed: reasons to take this   fexofenadine 180 MG tablet Commonly known as: ALLEGRA Take 180 mg by mouth daily.   fluticasone 50 MCG/ACT nasal spray Commonly known as: FLONASE Place 2 sprays into both nostrils daily.   furosemide 20 MG tablet Commonly known as: LASIX Take 1 tablet (20 mg total) by mouth daily.   guaiFENesin 600 MG 12 hr tablet Commonly known as: MUCINEX Take 1 tablet (600 mg total) by mouth 2 (two) times daily for 5 days.   meclizine 25 MG tablet Commonly known as: ANTIVERT TAKE 1 TABLET(25 MG) BY MOUTH EVERY 6 HOURS AS NEEDED FOR DIZZINESS What changed: See the new instructions.   metoprolol tartrate 25 MG tablet Commonly known as: LOPRESSOR Take 1 tablet (25 mg total) by mouth 3 (three) times daily. May take 1 extra tablet as needed for heart rate over 100 What changed:   when to take this  additional instructions   Ocuvite Extra Tabs Take 1 tablet by mouth in the morning and at bedtime.   ondansetron 4 MG tablet Commonly known as: ZOFRAN Take 1 tablet (4 mg total) by mouth every 8 (eight) hours as needed for nausea or vomiting.   pantoprazole 40 MG tablet Commonly known as: Protonix Take 1 tablet (40 mg total) by mouth 2 (two) times daily.      Allergies  Allergen Reactions  . Codeine Phosphate Other (See Comments)    Hallucinations  . Adhesive [Tape]     Electrodes for EKG- skin blistering, erythema  . Codeine Other (See Comments)    Hallucinations  Other reaction(s): Other   Follow-up Information     Burnell Blanks, MD Follow up on 06/12/2019.   Specialty: Cardiology Why: Hospital follow-up 5/19 at [facility] information: [place] [place] [address] [phone]            The results of significant diagnostics from this hospitalization (including imaging, microbiology, ancillary and laboratory) are listed below for reference.    Significant Diagnostic Studies: CT CHEST W CONTRAST  Result Date: 05/08/2019 CLINICAL DATA:  Esophageal mass. EXAM: CT CHEST AND ABDOMEN WITH CONTRAST TECHNIQUE: Multidetector CT imaging of the chest and abdomen was performed following the standard protocol during bolus administration of intravenous contrast. CONTRAST:  177mL OMNIPAQUE IOHEXOL 300 MG/ML  SOLN COMPARISON:  None. FINDINGS: CT CHEST FINDINGS Cardiovascular: The heart is mildly enlarged. No pericardial effusion. There is mild tortuosity, ectasia and calcification of the thoracic aorta. No focal aneurysm or dissection. Three-vessel coronary artery calcifications are noted. The pulmonary arteries are unremarkable. Mediastinum/Nodes: No mediastinal or hilar mass or lymphadenopathy. Small scattered lymph nodes are noted. The  esophagus is grossly normal. I do not see an obvious esophageal mass. There is a small hiatal hernia. Enlarged multinodular thyroid goiter is noted. This has previously been evaluated with and followed by ultrasound. Lungs/Pleura: Chronic bronchitic type interstitial changes and areas of pulmonary scarring. No infiltrates or effusions. No worrisome pulmonary lesions or pulmonary nodules. Musculoskeletal: Skeletal surgical changes from a right mastectomy with a right breast prosthesis in place. No chest wall mass, supraclavicular or axillary adenopathy. The left breast is unremarkable. The bony thorax is intact. No worrisome bone lesions. Advanced degenerative changes involving the spine. CT ABDOMEN FINDINGS Hepatobiliary: There is a stable right hepatic  lobe cyst. No worrisome hepatic lesions or intrahepatic biliary dilatation. Layering high attenuation material in the gallbladder dependently is likely sludge or stones. No findings for acute cholecystitis. No common bile duct dilatation. Pancreas: No mass, inflammation or ductal dilatation. Spleen: Normal size. No focal lesions. Adrenals/Urinary Tract: The adrenal glands are normal. No worrisome renal lesions or hydronephrosis. No significant collecting system abnormalities. Small renal cysts are noted. Stomach/Bowel: The stomach, duodenum, visualized small bowel and visualized colon are unremarkable. No acute inflammatory changes, mass lesions or obstructive findings. Descending colon diverticulosis is noted. Vascular/Lymphatic: Advanced atherosclerotic calcifications involving the abdominal aorta but no aneurysm or dissection. The branch vessels are patent. The major venous structures are patent. No gastrohepatic ligament or celiac axis adenopathy. There is a 10.5 mm left-sided retroperitoneal lymph node. This was also present on prior CT scan from 2016 and measures 16 mm at that time. Other: No ascites or abdominal wall hernia. Musculoskeletal: No significant bony findings. Moderate degenerative changes involving the spine. IMPRESSION: 1. Surgical changes from a right mastectomy with a right breast prosthesis in place. No findings for recurrent chest wall tumor or axillary adenopathy. 2. Small hiatal hernia. No esophageal mass is identified. No paraesophageal or mediastinal or upper abdominal lymphadenopathy. 3. No findings for metastatic disease involving the chest or abdomen. 4. 10.5 mm left-sided retroperitoneal lymph node, also present on prior CT scan from 2016. 5. Advanced atherosclerotic calcifications involving the thoracic and abdominal aorta and branch vessels including three-vessel coronary artery calcifications. 6. Enlarged multinodular thyroid goiter, previously evaluated and followed by ultrasound.  7. Aortic atherosclerosis. Aortic Atherosclerosis (ICD10-I70.0). Electronically Signed   By: Marijo Sanes M.D.   On: 05/08/2019 11:40   CT ABDOMEN W CONTRAST  Result Date: 05/08/2019 CLINICAL DATA:  Esophageal mass. EXAM: CT CHEST AND ABDOMEN WITH CONTRAST TECHNIQUE: Multidetector CT imaging of the chest and abdomen was performed following the standard protocol during bolus administration of intravenous contrast. CONTRAST:  145mL OMNIPAQUE IOHEXOL 300 MG/ML  SOLN COMPARISON:  None. FINDINGS: CT CHEST FINDINGS Cardiovascular: The heart is mildly enlarged. No pericardial effusion. There is mild tortuosity, ectasia and calcification of the thoracic aorta. No focal aneurysm or dissection. Three-vessel coronary artery calcifications are noted. The pulmonary arteries are unremarkable. Mediastinum/Nodes: No mediastinal or hilar mass or lymphadenopathy. Small scattered lymph nodes are noted. The esophagus is grossly normal. I do not see an obvious esophageal mass. There is a small hiatal hernia. Enlarged multinodular thyroid goiter is noted. This has previously been evaluated with and followed by ultrasound. Lungs/Pleura: Chronic bronchitic type interstitial changes and areas of pulmonary scarring. No infiltrates or effusions. No worrisome pulmonary lesions or pulmonary nodules. Musculoskeletal: Skeletal surgical changes from a right mastectomy with a right breast prosthesis in place. No chest wall mass, supraclavicular or axillary adenopathy. The left breast is unremarkable. The bony thorax is intact. No worrisome bone  lesions. Advanced degenerative changes involving the spine. CT ABDOMEN FINDINGS Hepatobiliary: There is a stable right hepatic lobe cyst. No worrisome hepatic lesions or intrahepatic biliary dilatation. Layering high attenuation material in the gallbladder dependently is likely sludge or stones. No findings for acute cholecystitis. No common bile duct dilatation. Pancreas: No mass, inflammation or  ductal dilatation. Spleen: Normal size. No focal lesions. Adrenals/Urinary Tract: The adrenal glands are normal. No worrisome renal lesions or hydronephrosis. No significant collecting system abnormalities. Small renal cysts are noted. Stomach/Bowel: The stomach, duodenum, visualized small bowel and visualized colon are unremarkable. No acute inflammatory changes, mass lesions or obstructive findings. Descending colon diverticulosis is noted. Vascular/Lymphatic: Advanced atherosclerotic calcifications involving the abdominal aorta but no aneurysm or dissection. The branch vessels are patent. The major venous structures are patent. No gastrohepatic ligament or celiac axis adenopathy. There is a 10.5 mm left-sided retroperitoneal lymph node. This was also present on prior CT scan from 2016 and measures 16 mm at that time. Other: No ascites or abdominal wall hernia. Musculoskeletal: No significant bony findings. Moderate degenerative changes involving the spine. IMPRESSION: 1. Surgical changes from a right mastectomy with a right breast prosthesis in place. No findings for recurrent chest wall tumor or axillary adenopathy. 2. Small hiatal hernia. No esophageal mass is identified. No paraesophageal or mediastinal or upper abdominal lymphadenopathy. 3. No findings for metastatic disease involving the chest or abdomen. 4. 10.5 mm left-sided retroperitoneal lymph node, also present on prior CT scan from 2016. 5. Advanced atherosclerotic calcifications involving the thoracic and abdominal aorta and branch vessels including three-vessel coronary artery calcifications. 6. Enlarged multinodular thyroid goiter, previously evaluated and followed by ultrasound. 7. Aortic atherosclerosis. Aortic Atherosclerosis (ICD10-I70.0). Electronically Signed   By: Marijo Sanes M.D.   On: 05/08/2019 11:40   DG ABD ACUTE 2+V W 1V CHEST  Result Date: 05/06/2019 CLINICAL DATA:  Vomiting and abdominal pain with GI bleeding EXAM: DG ABDOMEN  ACUTE W/ 1V CHEST COMPARISON:  None. FINDINGS: Cardiac shadow is at the upper limits of normal in size. Loop recorder is noted aortic calcifications are seen. The lungs are mildly hyperinflated without focal infiltrate or sizable effusion. Scattered large and small bowel gas is noted. No abnormal mass or abnormal calcifications are seen. No free air is noted. Degenerative changes of lumbar spine are seen. IMPRESSION: No acute abnormality noted. Electronically Signed   By: Inez Catalina M.D.   On: 05/06/2019 14:29   CUP PACEART REMOTE DEVICE CHECK  Result Date: 05/07/2019 Carelink summary report received. Battery status OK. Normal device function. No new symptom episodes, tachy episodes, brady, or pause episodes. AF burden 92%, stable trend; ventricular rates controlled; on Eliquis. Monthly summary reports and ROV/PRN [facility]   Microbiology: Recent Results (from the past 240 hour(s))  SARS CORONAVIRUS 2 (TAT 6-24 HRS) Nasopharyngeal Nasopharyngeal Swab     Status: None   Collection Time: 05/06/19  4:38 PM   Specimen: Nasopharyngeal Swab  Result Value Ref Range Status   SARS Coronavirus 2 NEGATIVE NEGATIVE Final    Comment: (NOTE) SARS-CoV-2 target nucleic acids are NOT DETECTED. The SARS-CoV-2 RNA is generally detectable in upper and lower respiratory specimens during the acute phase of infection. Negative results do not preclude SARS-CoV-2 infection, do not rule out co-infections with other pathogens, and should not be used as the sole basis for treatment or other patient management decisions. Negative results must be combined with clinical observations, patient history, and epidemiological information. The expected result is Negative. Fact Sheet for Patients:  SugarRoll.be Fact Sheet for Healthcare Providers: https://www.woods-mathews.com/ This test is not yet approved or cleared by the [place] FDA and  has been authorized for detection and/or  diagnosis of SARS-CoV-2 by FDA under an Emergency Use Authorization (EUA). This EUA will remain  in effect (meaning this test can be used) for the duration of the COVID-19 declaration under Section 56 4(b)(1) of the Act, 21 U.S.C. section 360bbb-3(b)(1), unless the authorization is terminated or revoked sooner. Performed at [facility], [place] [address], [place], [place] [address]      Labs: Basic Metabolic Panel: Recent Labs  Lab 05/06/19 1320 05/06/19 1338 05/07/19 0420 05/08/19 0604  NA 143 143 145 142  K 5.0 4.7 4.0 3.9  CL 110 109 114* 113*  CO2 25  --  22 20*  GLUCOSE 100* 99 105* 93  BUN 56* 50* 44* 21  CREATININE 0.67 0.70 0.73 0.66  CALCIUM 9.1  --  8.6* 8.3*   Liver Function Tests: Recent Labs  Lab 05/06/19 1320 05/07/19 0420  AST 21 18  ALT 23 19  ALKPHOS 72 53  BILITOT 0.8 1.1  PROT 6.4* 5.3*  ALBUMIN 4.0 3.3*   No results for input(s): LIPASE, AMYLASE in the last 168 hours. No results for input(s): AMMONIA in the last 168 hours. CBC: Recent Labs  Lab 05/06/19 1320 05/06/19 1338 05/07/19 0420 05/07/19 1045 05/07/19 1923 05/08/19 0604 05/09/19 0602  WBC 12.5*   < > 10.7* 10.5 9.5 8.4 7.8  NEUTROABS 9.3*  --   --   --   --   --   --   HGB 13.2   < > 11.0* 11.9* 10.4* 10.5* 10.7*  HCT 40.4   < > 34.1* 35.4* 31.9* 32.8* 31.0*  MCV 97.8   < > 97.4 95.2 98.2 100.6* 95.7  PLT 199   < > 171 PLATELET CLUMPS NOTED ON SMEAR, UNABLE TO ESTIMATE 168 148* 171   < > = values in this interval not displayed.    ProBNP (last 3 results) Recent Labs    07/17/18 1319  PROBNP 720        Signed:  Oswald Hillock MD.  [facility] 05/10/2019, 11:43 AM

## 2019-05-10 NOTE — Care Management Important Message (Signed)
Important Message  Patient Details IM Letter given to RN to present to the Patient Name: Selena [name] MRN: [id number] Date of Birth: [birth year]   Medicare Important Message Given:  Yes     [name] 05/10/2019, 10:09 AM

## 2019-05-10 NOTE — Telephone Encounter (Signed)
-----   Message from Irving Copas., MD sent at 05/10/2019  3:44 AM EDT ----- Bertie Mcconathy,Please schedule this patient a follow-up in clinic with [facility] or [place] or myself in approximately 3 to 4 weeks.Patient needs a CBC/iron/TIBC/ferritin to be drawn a few days prior to clinic visit.She needs a endoscopy in the [place] in 2.5 months.She will need cardiology approval off her Eliquis but that can be obtained at the clinic visit.She will likely be discharged later today or over the weekend.Thanks.GM

## 2019-05-13 ENCOUNTER — Encounter: Payer: Medicare Other | Admitting: Physical Therapy

## 2019-05-13 ENCOUNTER — Telehealth: Payer: Self-pay | Admitting: Family Medicine

## 2019-05-13 NOTE — Telephone Encounter (Signed)
Ok to add into noon spot if needed this week.

## 2019-05-13 NOTE — Telephone Encounter (Signed)
Noted  

## 2019-05-13 NOTE — Telephone Encounter (Signed)
Spoke with the pt and scheduled an appt for Friday to arrive at 8:15am for check in.  Patient asked if she could be seen sooner as she has chest pain and tightness.  I advised the pt to go to the ER immediately and she agreed.  Message sent to PCP.

## 2019-05-13 NOTE — Telephone Encounter (Signed)
Pt was released from hospital on Friday 05/10/19 she found out she has a bleeding ulcer and needed to follow with her pcp in regards to treatment and what her next steps should be.

## 2019-05-13 NOTE — Telephone Encounter (Signed)
Thank you. Will follow in notes.

## 2019-05-15 ENCOUNTER — Encounter: Payer: Medicare Other | Admitting: Physical Therapy

## 2019-05-16 ENCOUNTER — Other Ambulatory Visit: Payer: Self-pay

## 2019-05-16 NOTE — Progress Notes (Signed)
Virtual Visit via Telephone Note  I connected with Selena [name]  on 05/17/19 at  8:30 AM EDT by telephone and verified that I am speaking with the correct person using two identifiers.   I discussed the limitations, risks, security and privacy concerns of performing an evaluation and management service by telephone and the availability of in person appointments. I also discussed with the patient that there may be a patient responsible charge related to this service. The patient expressed understanding and agreed to proceed.  Location patient: home Location provider: work office Participants present for the call: patient, provider Patient did not have a visit in the prior 7 days to address this/these issue(s).   History of Present Illness: Recently in hospital admitted 05/06/19 and discharged 05/10/19 after episode of hematemesis/melena. Evaluated by GI with endoscopy inpatient and small area of extrinsic compression/multiple gastric ulcers noted. CT chest/abd/pelvis was ok. eliquis (for a fib) held x 1 week (ok'd by cardio); plan for repeat endoscopy in 2-3 months.  *suggested f/u thyroid goiter (but [place] was done 09/2018 and suggested repeat annually so still within that time frame) *received IV iron *HF stable *meloxicam held   States that cough is bothering her. Clear or white phlegm. No fevers or chills. No hemoptysis.  BP 124/76 and HR 94. Heart rate is up and down. Sometimes HR is in the 80's.   She is using the flonase. Still taking allegra.   Not sensing any acid reflux. Normal bowel movements since coming home.   Has follow up labs 14th next month.   Observations/Objective: Patient sounds cheerful and well on the phone. I do not appreciate any SOB, but patient is coughing frequently during phone call. Speech and thought processing are grossly intact. Patient reported vitals:see above  Assessment and Plan: 1. Cough We discussed possible etiologies. She is taking allergy  meds, reflux meds. We are going to add inhalers. She has use albuterol in past with improvement. Now been coughing for a couple of weeks and suspect she will benefit from some steroid inhaled. We will check in with her on Monday. - benzonatate (TESSALON PERLES) 100 MG capsule; Take 1 capsule (100 mg total) by mouth 2 (two) times daily as needed.  Dispense: 20 capsule; Refill: 0 - albuterol (VENTOLIN HFA) 108 (90 Base) MCG/ACT inhaler; Inhale 2 puffs into the lungs every 6 (six) hours as needed for wheezing or shortness of breath.  Dispense: 18 g; Refill: 2 - fluticasone furoate-vilanterol (BREO ELLIPTA) 100-25 MCG/INH AEPB; Inhale 1 puff into the lungs daily. Rinse mouth with water after use  Dispense: 60 each; Refill: 2  2. Essential hypertension Stable. Continue current meds.  3. Acute gastric ulcer with hemorrhage Resolved. Following with GI. Lab recheck set up with them already.   Follow Up Instructions: Return for pending update monday. We discussed hospital stay, diagnoses, thyroid concerns  [id number] 5-10 [id number] 11-20 9443 21-30 I did not refer this patient for an OV in the next 24 hours for this/these issue(s).  I discussed the assessment and treatment plan with the patient. The patient was provided an opportunity to ask questions and all were answered. The patient agreed with the plan and demonstrated an understanding of the instructions.   The patient was advised to call back or seek an in-person evaluation if the symptoms worsen or if the condition fails to improve as anticipated.  I provided 23 minutes of non-face-to-face time during this encounter.   Micheline Rough, MD

## 2019-05-17 ENCOUNTER — Telehealth (INDEPENDENT_AMBULATORY_CARE_PROVIDER_SITE_OTHER): Payer: Medicare Other | Admitting: Family Medicine

## 2019-05-17 ENCOUNTER — Encounter: Payer: Self-pay | Admitting: Family Medicine

## 2019-05-17 DIAGNOSIS — R059 Cough, unspecified: Secondary | ICD-10-CM

## 2019-05-17 DIAGNOSIS — I10 Essential (primary) hypertension: Secondary | ICD-10-CM

## 2019-05-17 DIAGNOSIS — R05 Cough: Secondary | ICD-10-CM | POA: Diagnosis not present

## 2019-05-17 DIAGNOSIS — K250 Acute gastric ulcer with hemorrhage: Secondary | ICD-10-CM | POA: Diagnosis not present

## 2019-05-17 MED ORDER — BREO ELLIPTA 100-25 MCG/INH IN AEPB: 1.0000 | INHALATION_SPRAY | Freq: Every day | RESPIRATORY_TRACT | 2 refills | Status: DC

## 2019-05-17 MED ORDER — ALBUTEROL SULFATE HFA 108 (90 BASE) MCG/ACT IN AERS: 2.0000 | INHALATION_SPRAY | Freq: Four times a day (QID) | RESPIRATORY_TRACT | 2 refills | Status: DC | PRN

## 2019-05-17 MED ORDER — BENZONATATE 100 MG PO CAPS: 100.0000 mg | ORAL_CAPSULE | Freq: Two times a day (BID) | ORAL | 0 refills | Status: DC | PRN

## 2019-05-20 ENCOUNTER — Telehealth: Payer: Self-pay | Admitting: *Deleted

## 2019-05-20 NOTE — Telephone Encounter (Signed)
Patient informed of the message below.

## 2019-05-20 NOTE — Telephone Encounter (Signed)
Patient called back and stated the "cough has slowed down", feels this is worse when she talks and now she has white sputum.  Denies a fever and stated she does feel cold at night.  Stated she is still taking the medications with no adverse reactions.  Message sent to PCP.

## 2019-05-20 NOTE — Telephone Encounter (Signed)
Selena [name]; let's have her keep monitoring and can we check in with her next week for update? Should continue to get better daily and have her call if not.

## 2019-05-20 NOTE — Telephone Encounter (Signed)
Left a message for the pt to return my call.  

## 2019-05-20 NOTE — Telephone Encounter (Signed)
-----   Message from Caren Macadam, MD sent at 05/17/2019  9:04 AM EDT ----- Please check in with her Monday and see how cough is doing

## 2019-05-22 ENCOUNTER — Encounter: Payer: Medicare Other | Admitting: Physical Therapy

## 2019-05-24 ENCOUNTER — Encounter: Payer: Medicare Other | Admitting: Physical Therapy

## 2019-05-27 NOTE — Telephone Encounter (Signed)
*  I hate to make her see someone else, but I do think that lung exam would be helpful; so please see if we can get her in with [place] [facility] since we cannot see her here with cough unfortunately.   *only way around this is: if she is not using spacing chamber with her albuterol inhaler please send this in for her and she will need to be instructed with how to use from pharmacy OR; we could try to get her a nebulizer and send in albuterol and atrovent for her to do treatments with this TID.   *I typically would try some steroids, but with her recent stomach bleed this is not a good option.

## 2019-05-27 NOTE — Telephone Encounter (Signed)
The patient is calling to have Dr. Ethlyn Gallery to call her in something else for her cough and wants someone to check her chest.  The patient was just seen Friday 05/24/2019

## 2019-05-27 NOTE — Telephone Encounter (Signed)
Please explain our limitations with in office exam due to cough, BUT:  *fevers/chills? *wheezing? *did she get inhaler? What is she using now and how often? Any relief? *is she coughing up phelgm? Make sure not coughing up blood? *short of breath?  *any other sick symptoms?  I should be able to send in something for her pending that response and if needed we can also try to get her in with the [facility] for in person exam.

## 2019-05-27 NOTE — Telephone Encounter (Signed)
Spoke with the pt and she denies a fever or chills, stated she is wheezing and has used both inhalers and cough pills with no relief.  Patient complains of white sputum for the past 2 days and stated the sputum was yellow previously and denies any blood.  Stated shortness of breath only occurs with coughing spells which she has every 15 minutes.  States only other problem is increased flatulence which occurs with cough.  Message sent to PCP.

## 2019-05-27 NOTE — Telephone Encounter (Signed)
Spoke with the pt and informed her of the message below.  Patient stated she does not feel the inhaler is helping and I gave her the number to call the respiratory clinic at [phone] to call for an appt.  Message sent to PCP.

## 2019-05-28 ENCOUNTER — Ambulatory Visit
Admission: RE | Admit: 2019-05-28 | Discharge: 2019-05-28 | Disposition: A | Discharge status: Home / Self Care | Payer: Medicare Other | Source: Ambulatory Visit | Attending: Nurse Practitioner | Admitting: Nurse Practitioner

## 2019-05-28 ENCOUNTER — Other Ambulatory Visit: Payer: Self-pay

## 2019-05-28 ENCOUNTER — Ambulatory Visit: Payer: Medicare Other | Admitting: Nurse Practitioner

## 2019-05-28 VITALS — BP 132/84 | HR 74 | Temp 97.5°F | Ht 67.0 in | Wt 203.0 lb

## 2019-05-28 DIAGNOSIS — R0602 Shortness of breath: Secondary | ICD-10-CM | POA: Diagnosis not present

## 2019-05-28 DIAGNOSIS — R059 Cough, unspecified: Secondary | ICD-10-CM

## 2019-05-28 DIAGNOSIS — J42 Unspecified chronic bronchitis: Secondary | ICD-10-CM

## 2019-05-28 DIAGNOSIS — R05 Cough: Secondary | ICD-10-CM

## 2019-05-28 DIAGNOSIS — R053 Chronic cough: Secondary | ICD-10-CM | POA: Insufficient documentation

## 2019-05-28 HISTORY — DX: Shortness of breath: R06.02

## 2019-05-28 MED ORDER — ALBUTEROL SULFATE (2.5 MG/3ML) 0.083% IN NEBU
2.5000 mg | INHALATION_SOLUTION | Freq: Four times a day (QID) | RESPIRATORY_TRACT | 1 refills | Status: DC | PRN
Start: 2019-05-28 — End: 2020-02-19

## 2019-05-28 MED ORDER — METOPROLOL TARTRATE 25 MG PO TABS: 25.0000 mg | ORAL_TABLET | Freq: Three times a day (TID) | ORAL | 1 refills | Status: DC

## 2019-05-28 NOTE — Patient Instructions (Addendum)
Shortness of breath Cough:  Will hold on steroids due to recent GI bleed  Please continue inhalers as prescribed by PCP  Will order albuterol nebulizer to use as needed  Will order chest x ray and call with results - will order antibiotic if indicated   Will need to be tested for COVID   Encouraged deep breathing exercises  -may start mucinex DM   -Continue gastroesophageal reflux disease treatment with elevating the head your bed and taking antacids  -Continue taking over-the-counter antihistamines and nasal fluticasone to help with allergic rhinitis  -You need to try to suppress your cough to allow your larynx (voice box) to heal.  For three days don't talk, laugh, sing, or clear your throat. Do everything you can to suppress the cough during this time.  -Use hard candies (sugarless Jolly Ranchers) or non-mint or non-menthol containing cough drops during this time to soothe your throat.    Use a cough suppressant around the clock during this time.  After three days, gradually increase the use of your voice and back off on the cough suppressants.   Follow up as needed

## 2019-05-28 NOTE — Progress Notes (Signed)
@Patient  ID: Selena [name], female    DOB: [birth year], 84 y.o.   MRN: [id number]  Chief Complaint  Patient presents with  . Cough    Referred by her PCP, Has had a cough for about 1 month.     Referring provider: Caren Macadam, MD   84 year old female with history of atrial fibrillation (eliquis), hypertension, diverticulosis, non-hodgkin lymphoma.   HPI  Patient presents for respiratory clinic visit. She ws admitted to the hospital on 05/06/19 for GI bleed. She states that since she got out of the hospital that she has developed a lingering cough. She states that the cough is non-productive. She does get short of breath at times with coughing spells. Patient is active and can perform activities of daily living independently. She denies any recent fevers. She was recently seen by PCP through tele-visit for this same issue. PCP would not allow patient to come for in office visit and suggested that she be examined here. PCP did start patient on albuterol inhaler, Breo, and tessalon Perles. Patient has been compliant with these medications. Denies f/c/s, n/v/d, hemoptysis, PND, leg swelling chest pain or edema.      Allergies  Allergen Reactions  . Codeine Phosphate Other (See Comments)    Hallucinations  . Adhesive [Tape]     Electrodes for EKG- skin blistering, erythema  . Codeine Other (See Comments)    Hallucinations  Other reaction(s): Other    Immunization History  Administered Date(s) Administered  . Influenza Split 10/20/2011  . Influenza Whole 11/07/2006  . Influenza, High Dose Seasonal PF 10/13/2016, 10/12/2017  . Influenza-Unspecified 09/07/2013, 11/13/2014, 10/15/2015, 09/26/2018  . Pneumococcal Conjugate-13 08/26/2013  . Pneumococcal Polysaccharide-23 10/29/2007, 08/28/2017  . Td 05/27/2008  . Tdap 05/30/2018  . Zoster 10/29/2007    Past Medical History:  Diagnosis Date  . Anemia    PMH  . Aneurysm (arteriovenous) of coronary vessels   . Atrial  fibrillation ([place]) 01/01/2007  . BACK PAIN, UPPER 07/09/2008  . Breast cancer, stage 1 ([place])   . Bruises easily   . Cataract    history  . Complication of anesthesia   . Diverticulitis   . DIVERTICULOSIS, COLON 12/26/2006  . Essential hypertension 10/27/2017  . Follicular non-Hodgkin's lymphoma ([place])   . Goiter    multi-nodular  . Hx of colonoscopy 2009  . LIVER FUNCTION TESTS, ABNORMAL, HX OF 12/26/2007  . Memory loss 05/16/2007  . OSTEOARTHRITIS 12/26/2006   takes Diclofenac daily but has stopped for surgery  . PONV (postoperative nausea and vomiting)   . Shortness of breath 05/28/2019  . Skin cancer   . VERTIGO 09/07/2009  . Wears dentures   . Wears glasses   . Wears hearing aid    bilateral    Tobacco History: Social History   Tobacco Use  Smoking Status Former Smoker  . Packs/day: 0.10  . Years: 20.00  . Pack years: 2.00  . Types: Cigarettes  . Quit date: 01/24/1978  . Years since quitting: 41.3  Smokeless Tobacco Never Used   Counseling given: Not Answered   Outpatient Encounter Medications as of 05/28/2019  Medication Sig  . albuterol (VENTOLIN HFA) 108 (90 Base) MCG/ACT inhaler Inhale 2 puffs into the lungs every 6 (six) hours as needed for wheezing or shortness of breath.  [name] amLODipine (NORVASC) 5 MG tablet TAKE 1 TABLET BY MOUTH DAILY  . apixaban (ELIQUIS) 5 MG TABS tablet Take 1 tablet (5 mg total) by mouth 2 (two) times daily.  [name]  benzonatate (TESSALON PERLES) 100 MG capsule Take 1 capsule (100 mg total) by mouth 2 (two) times daily as needed.  . fluticasone (FLONASE) 50 MCG/ACT nasal spray Place 2 sprays into both nostrils daily.  . fluticasone furoate-vilanterol (BREO ELLIPTA) 100-25 MCG/INH AEPB Inhale 1 puff into the lungs daily. Rinse mouth with water after use  . furosemide (LASIX) 20 MG tablet Take 1 tablet (20 mg total) by mouth daily.  . metoprolol tartrate (LOPRESSOR) 25 MG tablet Take 1 tablet (25 mg total) by mouth 3 (three) times daily. May take 1 extra  tablet as needed for heart rate over 100  . pantoprazole (PROTONIX) 40 MG tablet Take 1 tablet (40 mg total) by mouth 2 (two) times daily.  [name] albuterol (PROVENTIL) (2.5 MG/3ML) 0.083% nebulizer solution Take 3 mLs (2.5 mg total) by nebulization every 6 (six) hours as needed for wheezing or shortness of breath.  . fexofenadine (ALLEGRA) 180 MG tablet Take 180 mg by mouth daily.  . meclizine (ANTIVERT) 25 MG tablet TAKE 1 TABLET(25 MG) BY MOUTH EVERY 6 HOURS AS NEEDED FOR DIZZINESS  . Multiple Vitamins-Minerals (OCUVITE EXTRA) TABS Take 1 tablet by mouth in the morning and at bedtime.  . ondansetron (ZOFRAN) 4 MG tablet Take 1 tablet (4 mg total) by mouth every 8 (eight) hours as needed for nausea or vomiting.  . [DISCONTINUED] metoprolol tartrate (LOPRESSOR) 25 MG tablet Take 1 tablet (25 mg total) by mouth 3 (three) times daily. May take 1 extra tablet as needed for heart rate over 100 (Patient taking differently: Take 25 mg by mouth as directed. Take 1 tablet (25 mg) by mouth BID and May Take 1 extra tablet as needed for heart rate over 100)   No facility-administered encounter medications on file as of 05/28/2019.     Review of Systems  Review of Systems  Constitutional: Negative.  Negative for chills and fever.  HENT: Negative.   Respiratory: Positive for cough and shortness of breath.   Cardiovascular: Negative.  Negative for chest pain and leg swelling.  Gastrointestinal: Negative.   Allergic/Immunologic: Negative.   Neurological: Negative.   Psychiatric/Behavioral: Negative.        Physical Exam  BP 132/84 (BP Location: Left Arm, Patient Position: Sitting, Cuff Size: Small)   Pulse 74   Temp (!) 97.5 F (36.4 C)   Ht 5\' 7"  (1.702 m)   Wt 203 lb (92.1 kg)   SpO2 97%   BMI 31.79 kg/m   Wt Readings from Last 5 Encounters:  05/28/19 203 lb (92.1 kg)  05/07/19 205 lb (93 kg)  05/06/19 206 lb 2 oz (93.5 kg)  12/18/18 207 lb (93.9 kg)  12/13/18 204 lb 9.6 oz (92.8 kg)      Physical Exam Vitals and nursing note reviewed.  Constitutional:      General: She is not in acute distress.    Appearance: She is well-developed.  Cardiovascular:     Rate and Rhythm: Normal rate. Rhythm irregular.     Comments: Chronic a-fib Pulmonary:     Effort: Pulmonary effort is normal.     Breath sounds: Normal breath sounds.  Neurological:     Mental Status: She is alert and oriented to person, place, and time.       Imaging: CT CHEST W CONTRAST  Result Date: 05/08/2019 CLINICAL DATA:  Esophageal mass. EXAM: CT CHEST AND ABDOMEN WITH CONTRAST TECHNIQUE: Multidetector CT imaging of the chest and abdomen was performed following the standard protocol during bolus administration of  intravenous contrast. CONTRAST:  149mL OMNIPAQUE IOHEXOL 300 MG/ML  SOLN COMPARISON:  None. FINDINGS: CT CHEST FINDINGS Cardiovascular: The heart is mildly enlarged. No pericardial effusion. There is mild tortuosity, ectasia and calcification of the thoracic aorta. No focal aneurysm or dissection. Three-vessel coronary artery calcifications are noted. The pulmonary arteries are unremarkable. Mediastinum/Nodes: No mediastinal or hilar mass or lymphadenopathy. Small scattered lymph nodes are noted. The esophagus is grossly normal. I do not see an obvious esophageal mass. There is a small hiatal hernia. Enlarged multinodular thyroid goiter is noted. This has previously been evaluated with and followed by ultrasound. Lungs/Pleura: Chronic bronchitic type interstitial changes and areas of pulmonary scarring. No infiltrates or effusions. No worrisome pulmonary lesions or pulmonary nodules. Musculoskeletal: Skeletal surgical changes from a right mastectomy with a right breast prosthesis in place. No chest wall mass, supraclavicular or axillary adenopathy. The left breast is unremarkable. The bony thorax is intact. No worrisome bone lesions. Advanced degenerative changes involving the spine. CT ABDOMEN FINDINGS  Hepatobiliary: There is a stable right hepatic lobe cyst. No worrisome hepatic lesions or intrahepatic biliary dilatation. Layering high attenuation material in the gallbladder dependently is likely sludge or stones. No findings for acute cholecystitis. No common bile duct dilatation. Pancreas: No mass, inflammation or ductal dilatation. Spleen: Normal size. No focal lesions. Adrenals/Urinary Tract: The adrenal glands are normal. No worrisome renal lesions or hydronephrosis. No significant collecting system abnormalities. Small renal cysts are noted. Stomach/Bowel: The stomach, duodenum, visualized small bowel and visualized colon are unremarkable. No acute inflammatory changes, mass lesions or obstructive findings. Descending colon diverticulosis is noted. Vascular/Lymphatic: Advanced atherosclerotic calcifications involving the abdominal aorta but no aneurysm or dissection. The branch vessels are patent. The major venous structures are patent. No gastrohepatic ligament or celiac axis adenopathy. There is a 10.5 mm left-sided retroperitoneal lymph node. This was also present on prior CT scan from 2016 and measures 16 mm at that time. Other: No ascites or abdominal wall hernia. Musculoskeletal: No significant bony findings. Moderate degenerative changes involving the spine. IMPRESSION: 1. Surgical changes from a right mastectomy with a right breast prosthesis in place. No findings for recurrent chest wall tumor or axillary adenopathy. 2. Small hiatal hernia. No esophageal mass is identified. No paraesophageal or mediastinal or upper abdominal lymphadenopathy. 3. No findings for metastatic disease involving the chest or abdomen. 4. 10.5 mm left-sided retroperitoneal lymph node, also present on prior CT scan from 2016. 5. Advanced atherosclerotic calcifications involving the thoracic and abdominal aorta and branch vessels including three-vessel coronary artery calcifications. 6. Enlarged multinodular thyroid goiter,  previously evaluated and followed by ultrasound. 7. Aortic atherosclerosis. Aortic Atherosclerosis (ICD10-I70.0). Electronically Signed   By: Marijo Sanes M.D.   On: 05/08/2019 11:40   CT ABDOMEN W CONTRAST  Result Date: 05/08/2019 CLINICAL DATA:  Esophageal mass. EXAM: CT CHEST AND ABDOMEN WITH CONTRAST TECHNIQUE: Multidetector CT imaging of the chest and abdomen was performed following the standard protocol during bolus administration of intravenous contrast. CONTRAST:  117mL OMNIPAQUE IOHEXOL 300 MG/ML  SOLN COMPARISON:  None. FINDINGS: CT CHEST FINDINGS Cardiovascular: The heart is mildly enlarged. No pericardial effusion. There is mild tortuosity, ectasia and calcification of the thoracic aorta. No focal aneurysm or dissection. Three-vessel coronary artery calcifications are noted. The pulmonary arteries are unremarkable. Mediastinum/Nodes: No mediastinal or hilar mass or lymphadenopathy. Small scattered lymph nodes are noted. The esophagus is grossly normal. I do not see an obvious esophageal mass. There is a small hiatal hernia. Enlarged multinodular thyroid goiter is  noted. This has previously been evaluated with and followed by ultrasound. Lungs/Pleura: Chronic bronchitic type interstitial changes and areas of pulmonary scarring. No infiltrates or effusions. No worrisome pulmonary lesions or pulmonary nodules. Musculoskeletal: Skeletal surgical changes from a right mastectomy with a right breast prosthesis in place. No chest wall mass, supraclavicular or axillary adenopathy. The left breast is unremarkable. The bony thorax is intact. No worrisome bone lesions. Advanced degenerative changes involving the spine. CT ABDOMEN FINDINGS Hepatobiliary: There is a stable right hepatic lobe cyst. No worrisome hepatic lesions or intrahepatic biliary dilatation. Layering high attenuation material in the gallbladder dependently is likely sludge or stones. No findings for acute cholecystitis. No common bile duct  dilatation. Pancreas: No mass, inflammation or ductal dilatation. Spleen: Normal size. No focal lesions. Adrenals/Urinary Tract: The adrenal glands are normal. No worrisome renal lesions or hydronephrosis. No significant collecting system abnormalities. Small renal cysts are noted. Stomach/Bowel: The stomach, duodenum, visualized small bowel and visualized colon are unremarkable. No acute inflammatory changes, mass lesions or obstructive findings. Descending colon diverticulosis is noted. Vascular/Lymphatic: Advanced atherosclerotic calcifications involving the abdominal aorta but no aneurysm or dissection. The branch vessels are patent. The major venous structures are patent. No gastrohepatic ligament or celiac axis adenopathy. There is a 10.5 mm left-sided retroperitoneal lymph node. This was also present on prior CT scan from 2016 and measures 16 mm at that time. Other: No ascites or abdominal wall hernia. Musculoskeletal: No significant bony findings. Moderate degenerative changes involving the spine. IMPRESSION: 1. Surgical changes from a right mastectomy with a right breast prosthesis in place. No findings for recurrent chest wall tumor or axillary adenopathy. 2. Small hiatal hernia. No esophageal mass is identified. No paraesophageal or mediastinal or upper abdominal lymphadenopathy. 3. No findings for metastatic disease involving the chest or abdomen. 4. 10.5 mm left-sided retroperitoneal lymph node, also present on prior CT scan from 2016. 5. Advanced atherosclerotic calcifications involving the thoracic and abdominal aorta and branch vessels including three-vessel coronary artery calcifications. 6. Enlarged multinodular thyroid goiter, previously evaluated and followed by ultrasound. 7. Aortic atherosclerosis. Aortic Atherosclerosis (ICD10-I70.0). Electronically Signed   By: Marijo Sanes M.D.   On: 05/08/2019 11:40   DG ABD ACUTE 2+V W 1V CHEST  Result Date: 05/06/2019 CLINICAL DATA:  Vomiting and  abdominal pain with GI bleeding EXAM: DG ABDOMEN ACUTE W/ 1V CHEST COMPARISON:  None. FINDINGS: Cardiac shadow is at the upper limits of normal in size. Loop recorder is noted aortic calcifications are seen. The lungs are mildly hyperinflated without focal infiltrate or sizable effusion. Scattered large and small bowel gas is noted. No abnormal mass or abnormal calcifications are seen. No free air is noted. Degenerative changes of lumbar spine are seen. IMPRESSION: No acute abnormality noted. Electronically Signed   By: Inez Catalina M.D.   On: 05/06/2019 14:29   CUP PACEART REMOTE DEVICE CHECK  Result Date: 05/07/2019 [facility] summary report received. Battery status OK. Normal device function. No new symptom episodes, tachy episodes, brady, or pause episodes. AF burden 92%, stable trend; ventricular rates controlled; on Eliquis. Monthly summary reports and ROV/PRN [facility]    Assessment & Plan:   Shortness of breath Cough:  Will hold on steroids due to recent GI bleed  Please continue inhalers as prescribed by PCP  Will order albuterol nebulizer to use as needed  Will order chest x ray and call with results - will order antibiotic if indicated   Will need to be tested for COVID   Encouraged  deep breathing exercises  -may start mucinex DM   -Continue gastroesophageal reflux disease treatment with elevating the head your bed and taking antacids  -Continue taking over-the-counter antihistamines and nasal fluticasone to help with allergic rhinitis  -You need to try to suppress your cough to allow your larynx (voice box) to heal.  For three days don't talk, laugh, sing, or clear your throat. Do everything you can to suppress the cough during this time.  -Use hard candies (sugarless Jolly Ranchers) or non-mint or non-menthol containing cough drops during this time to soothe your throat.    Use a cough suppressant around the clock during this time.  After three days, gradually increase  the use of your voice and back off on the cough suppressants.   Follow up as needed      Fenton Foy, NP 05/28/2019

## 2019-05-28 NOTE — Telephone Encounter (Signed)
Noted  

## 2019-05-28 NOTE — Assessment & Plan Note (Addendum)
Cough:  Will hold on steroids due to recent GI bleed  Please continue inhalers as prescribed by PCP  Will order albuterol nebulizer to use as needed  Will order chest x ray and call with results - will order antibiotic if indicated   Will need to be tested for COVID   Encouraged deep breathing exercises  -may start mucinex DM   -Continue gastroesophageal reflux disease treatment with elevating the head your bed and taking antacids  -Continue taking over-the-counter antihistamines and nasal fluticasone to help with allergic rhinitis  -You need to try to suppress your cough to allow your larynx (voice box) to heal.  For three days don't talk, laugh, sing, or clear your throat. Do everything you can to suppress the cough during this time.  -Use hard candies (sugarless Jolly Ranchers) or non-mint or non-menthol containing cough drops during this time to soothe your throat.    Use a cough suppressant around the clock during this time.  After three days, gradually increase the use of your voice and back off on the cough suppressants.   Follow up as needed

## 2019-05-29 NOTE — Addendum Note (Signed)
Addended by: Thressa Sheller on: 05/29/2019 11:53 AM   Modules accepted: Orders

## 2019-05-29 NOTE — Progress Notes (Signed)
Patient notified of results, verbally understood. Patient stated she was going to get tested today. Advised patient to call us back with results.

## 2019-06-04 ENCOUNTER — Other Ambulatory Visit (INDEPENDENT_AMBULATORY_CARE_PROVIDER_SITE_OTHER): Payer: Medicare Other

## 2019-06-04 DIAGNOSIS — K922 Gastrointestinal hemorrhage, unspecified: Secondary | ICD-10-CM

## 2019-06-04 LAB — CBC WITH DIFFERENTIAL/PLATELET
Basophils Absolute: 0.1 10*3/uL (ref 0.0–0.1)
Basophils Relative: 1.2 % (ref 0.0–3.0)
Eosinophils Absolute: 0.4 10*3/uL (ref 0.0–0.7)
Eosinophils Relative: 4.2 % (ref 0.0–5.0)
HCT: 40.8 % (ref 36.0–46.0)
Hemoglobin: 13.9 g/dL (ref 12.0–15.0)
Lymphocytes Relative: 15.7 % (ref 12.0–46.0)
Lymphs Abs: 1.3 10*3/uL (ref 0.7–4.0)
MCHC: 34.1 g/dL (ref 30.0–36.0)
MCV: 97.5 fl (ref 78.0–100.0)
Monocytes Absolute: 0.8 10*3/uL (ref 0.1–1.0)
Monocytes Relative: 9 % (ref 3.0–12.0)
Neutro Abs: 5.9 10*3/uL (ref 1.4–7.7)
Neutrophils Relative %: 69.9 % (ref 43.0–77.0)
Platelets: 239 10*3/uL (ref 150.0–400.0)
RBC: 4.18 Mil/uL (ref 3.87–5.11)
RDW: 14 % (ref 11.5–15.5)
WBC: 8.5 10*3/uL (ref 4.0–10.5)

## 2019-06-04 LAB — IBC + FERRITIN
Ferritin: 131.5 ng/mL (ref 10.0–291.0)
Iron: 80 ug/dL (ref 42–145)
Saturation Ratios: 23 % (ref 20.0–50.0)
Transferrin: 248 mg/dL (ref 212.0–360.0)

## 2019-06-07 ENCOUNTER — Telehealth: Payer: Self-pay | Admitting: General Surgery

## 2019-06-07 ENCOUNTER — Ambulatory Visit: Payer: Medicare Other | Admitting: Nurse Practitioner

## 2019-06-07 VITALS — BP 116/64 | HR 95 | Temp 98.2°F | Resp 16 | Ht 66.5 in | Wt 200.4 lb

## 2019-06-07 DIAGNOSIS — K259 Gastric ulcer, unspecified as acute or chronic, without hemorrhage or perforation: Secondary | ICD-10-CM

## 2019-06-07 DIAGNOSIS — K922 Gastrointestinal hemorrhage, unspecified: Secondary | ICD-10-CM

## 2019-06-07 MED ORDER — PANTOPRAZOLE SODIUM 40 MG PO TBEC
40.0000 mg | DELAYED_RELEASE_TABLET | Freq: Two times a day (BID) | ORAL | 3 refills | Status: DC
Start: 2019-06-07 — End: 2019-10-11

## 2019-06-07 NOTE — Patient Instructions (Signed)
If you are age 82 or older, your body mass index should be between 23-30. Your Body mass index is 31.86 kg/m. If this is out of the aforementioned range listed, please consider follow up with your Primary Care Provider.  If you are age 38 or younger, your body mass index should be between 19-25. Your Body mass index is 31.86 kg/m. If this is out of the aformentioned range listed, please consider follow up with your Primary Care Provider.    We have sent the following medications to your pharmacy for you to pick up at your convenience:  Pantoprazole 40 mg 1 tablet twice a day No Motrin/ibuprophen Follow up with your PCP about your cough  You will be contaced by our office prior to your procedure for directions on holding your Eliquis  If you do not hear from our office 1 week prior to your scheduled procedure, please call [phone] to discuss.  Due to recent changes in healthcare laws, you may see the results of your imaging and laboratory studies on MyChart before your provider has had a chance to review them.  We understand that in some cases there may be results that are confusing or concerning to you. Not all laboratory results come back in the same time frame and the provider may be waiting for multiple results in order to interpret others.  Please give [place] 48 hours in order for your provider to thoroughly review all the results before contacting the office for clarification of your results.

## 2019-06-07 NOTE — Telephone Encounter (Signed)
[facility] Pre-operative Risk Assessment     Request for surgical clearance:     Endoscopy Procedure  What type of surgery is being performed?     Endoscopy  When is this surgery scheduled?     EGD  What type of clearance is required ?   Pharmacy  Are there any medications that need to be held prior to surgery and how long? Eliquis  Practice name and name of physician performing surgery?      [facility]  What is your office phone and fax number?      Phone- [phone]  [phone]  Anesthesia type (None, local, MAC, general) ?       MAC

## 2019-06-07 NOTE — Progress Notes (Signed)
06/07/2019 Selena [name] [id number] [birth year]   Chief Complaint: hospital follow up, bleeding stomach ulcers   History of Present Illness: HPI: Selena [name] is a 84 y.o. female with a past medical history of hypertension, CHF, atrial fibrillation on Eliquis, thoracic aneurysm, breast cancer stage I s/p right mastectomy 0000000,  follicular non-Hodgkin's lymphoma of intra-abdominal lymph nodes treated with Bendamustine and Rituxan in 2016.  I saw the patient during her hospital admission 05/06/2019 due to having an UGI bleed with hematemesis and melena. She is on Eliquis for atrial fibrillation. She was taking Meloxicam 7.5mg  daily x 20 years and prior to her admission she took Meloxicam bid x 3 days duet to having ankle pain. Occasionally took Advil. No prior history of GI bleed. Her admission Hg was 13.2 with an elevated BUN 56. Eliquis was held and she received IV PPI. She underwent an EGD by Dr. Rush Landmark on 05/07/2019 which showed questionable extrinsic compression with mild mucosal abnormality noted in the mid esophagus at 22cm,  biopsies showed benign squamous mucosa. A widely patent and non-obstructing Schatzki ring. A 3 cm hiatal hernia. Non-bleeding gastric ulcer with a small flat spot (Forrest Class IIc) in the fundus. As well as many non-bleeding gastric ulcers and erosions with a clean ulcer base (Forrest Class III). Erythematous mucosa in the stomach. Gastric biopsies showed chronic active gastritis. An abd/pelvic CT did not show any evidence of any mass to explain extrinsic compression.  A small hiatal hernia and an enlarge multinodular thyroid goiter was identified. She received IV iron x 1 dose. Her Hg dropped to 10.7 but stabilized, she did not require a blood transfusion.  She was discharged home on 05/10/2019 with the instructions continue Pantoprazole 40mg  po bid, restart Eliquis on 4/20 and to follow up in our office in 2 weeks.   She presents today for further GI follow  up and to schedule an EGD to assess for ulcer healing in  8 weeks. She complains of feeling fatigued. She continues to have brief intermittent episodes of "butterflies" in her chest. She is scheduled to see her cardiologist next week. She developed a productive cough consisting of yellow to white sputum without fever. A chest xray and Covid 19 test were done on 5/5 which were negative. She denies having any heartburn, upper or lower abdominal pain. No N/V. She is passing  1 to 2 normal formed brown bowel movements daily. No rectal bleeding or hematemesis. She remains on Pantoprazole 40mg  po bid. Repeat laboratory studies done 06/04/2019 showed Hg 13.9. HCT 40.8. Iron 80.   CBC Latest Ref Rng & Units 06/04/2019 05/09/2019 05/08/2019  WBC 4.0 - 10.5 K/uL 8.5 7.8 8.4  Hemoglobin 12.0 - 15.0 g/dL 13.9 10.7(L) 10.5(L)  Hematocrit 36.0 - 46.0 % 40.8 31.0(L) 32.8(L)  Platelets 150.0 - 400.0 K/uL 239.0 171 148(L)     EGD 05/07/2019 by Dr. Rush Landmark: Extrinsic impression with mild mucosal abnormality noted in the mid esophagus. Biopsied. - No other gross lesions in esophagus. - Widely patent and non-obstructing Schatzki ring. - 3 cm hiatal hernia. - Non-bleeding gastric ulcer with a small flat spot (Forrest Class IIc) was noted in the fundal region coming off of the region of the diaphragmatic hiatus region. This could be an ulcer on a subepithelial lesion such as a Leiomyoma or GIST, but did not want to tunnel biopsy today based on her clinical picture and status. - Many non-bleeding gastric ulcers and erosions with a clean ulcer base (Forrest Class  III) noted. Erythematous mucosa in the stomach. Biopsied for HP. - No gross lesions in the duodenal bulb, in the first portion of the duodenum and in the second portion of the duodenum  Biopsy results:   A. STOMACH, BIOPSY:  - Chronic active gastritis.  - There is no evidence of Helicobacter pylori, dysplasia, or malignancy.  - See comment.   B. ESOPHAGUS,  LESION, AT 22 CM, BIOPSY:  - Benign squamous mucosa.  - There is no evidence of dysplasia or malignancy.  - See comment.  A. A Warthin-Starry stain is negative for the presence of Helicobacter  pylori organisms.  B. No "lesion" is histologically identified.   Chest/Abdominal/Pelvic CT with contrast 05/08/2019:  1. Surgical changes from a right mastectomy with a right breast prosthesis in place. No findings for recurrent chest wall tumor or axillary adenopathy. Chronic bronchitic type interstitial changes and areas of pulmonary scarring. No infiltrates or effusions. No worrisome pulmonary lesions or pulmonary nodules. 2. Small hiatal hernia. No esophageal mass is identified. No paraesophageal or mediastinal or upper abdominal lymphadenopathy. 3. No findings for metastatic disease involving the chest or abdomen. 4. 10.5 mm left-sided retroperitoneal lymph node, also present on prior CT scan from 2016. 5. Advanced atherosclerotic calcifications involving the thoracic and abdominal aorta and branch vessels including three-vessel coronary artery calcifications. 6. Enlarged multinodular thyroid goiter, previously evaluated and followed by ultrasound. 7. Aortic atherosclerosis  Colonoscopy by Dr. Deatra Ina in 2012: showed questionable of segmental colitis from sigmoid to descending colon. Severe diverticulosis. Mother and maternal aunt with history of colon cancer   Current Medications, Allergies, Past Medical History, Past Surgical History, Family History and Social History were reviewed in [employer] record.  Physical Exam: BP 116/64   Pulse 95   Temp 98.2 F (36.8 C)   Resp 16   Ht 5' 6.5" (1.689 m)   Wt 200 lb 6.4 oz (90.9 kg)   SpO2 98%   BMI 31.86 kg/m  General: 84 year old female in no acute distress. Head: Normocephalic and atraumatic. Eyes: No scleral icterus. Conjunctiva pink . Ears: Normal auditory acuity. Mouth: No ulcers or lesions.    Lungs: Clear throughout to auscultation. Heart: Irregular rhythm,  no murmur. Abdomen: Soft, nondistended. Mild tender above the umbilicus without rebound or guarding. No masses or hepatomegaly. Normal bowel sounds x 4 quadrants. Lower midline scar intact.  Rectal: Deferred.  Musculoskeletal: Symmetrical with no gross deformities. Extremities: 1+ edema bilateral LE Neurological: Alert oriented x 4. No focal deficits.  Psychological: Alert and cooperative. Normal mood and affect  Assessment and Recommendations:  68. 84 year old female with atrial fibrillation on Eliquis admitted to the hospital 05/06/2019 with UGI bleed with hematemesis and melena. + NSAID use.  EGD 4/13 showed nonbleeding gastric ulcer in the fundal region and many nonbleeding gastric ulcers and erosions in the stomach. No evidence of Leiomyoma or GIST. No H. Pylori. A 3 cm hiatal hernia was noted without evidence of Cameron lesions.  -Repeat EGD in 2 months  -Continue Pantoprazole 40mg  po bid  -No NSAIDs -Patient to call our office/go to ED  if she develops hematemesis or melena   2. Anemia secondary to # 1. Hg stable.  3. Atrial fibrillation on Eliquis  4. Multi nodular goiter -Follow up with PCP  5. Persistent productive cough -Follow up with PCP

## 2019-06-09 NOTE — Progress Notes (Signed)
Attending Physician's Attestation   I have reviewed the chart.   I agree with the Advanced Practitioner's note, impression, and recommendations with any updates as below. Would move forward with this as an EGD/EUS such that if I do find a subepithelial lesion, although none was noted on CT scan, then I would be able to potentially biopsy this.  I believe we are waiting anticoagulation optimization prior to procedure.  She should have this scheduled at approximately 3 months since her last endoscopy.   Justice Britain, MD [facility] # [id number]

## 2019-06-10 ENCOUNTER — Other Ambulatory Visit: Payer: Self-pay

## 2019-06-10 ENCOUNTER — Telehealth (INDEPENDENT_AMBULATORY_CARE_PROVIDER_SITE_OTHER): Payer: Medicare Other | Admitting: Family Medicine

## 2019-06-10 ENCOUNTER — Encounter: Payer: Self-pay | Admitting: Family Medicine

## 2019-06-10 ENCOUNTER — Telehealth: Payer: Self-pay

## 2019-06-10 VITALS — Temp 98.0°F | Ht 66.5 in | Wt 199.0 lb

## 2019-06-10 DIAGNOSIS — R053 Chronic cough: Secondary | ICD-10-CM

## 2019-06-10 DIAGNOSIS — K920 Hematemesis: Secondary | ICD-10-CM

## 2019-06-10 DIAGNOSIS — R05 Cough: Secondary | ICD-10-CM

## 2019-06-10 DIAGNOSIS — E042 Nontoxic multinodular goiter: Secondary | ICD-10-CM | POA: Diagnosis not present

## 2019-06-10 NOTE — Telephone Encounter (Signed)
I agree with the Advanced Practitioner's note, impression, and recommendations with any updates as below. Would move forward with this as an EGD/EUS such that if I do find a subepithelial lesion, although none was noted on CT scan, then I would be able to potentially biopsy this.  I believe we are waiting anticoagulation optimization prior to procedure.  She should have this scheduled at approximately 3 months since her last endoscopy.   Justice Britain, MD [facility] # [id number]

## 2019-06-10 NOTE — Telephone Encounter (Signed)
Recall entered for EUS 3 months post last EGD

## 2019-06-10 NOTE — Progress Notes (Signed)
Virtual Visit via Telephone Note  I connected with Selena [name] on 5/17/21at  3:00 PM EDT by telephone and verified that I am speaking with the correct person using two identifiers.   I discussed the limitations, risks, security and privacy concerns of performing an evaluation and management service by telephone and the availability of in person appointments. I also discussed with the patient that there may be a patient responsible charge related to this service. The patient expressed understanding and agreed to proceed.  Location patient: home Location provider: work office Participants present for the call: patient, provider Patient did not have a visit in the prior 7 days to address this/these issue(s).   History of Present Illness: [name] is a 84 yo female with Hx of GERD, atrial fib,HFpEF,follicular lymphoma, and abnormal LFT's c/o persistent cough. She has had cough since recent hospitalization, 05/06/19. Productive cough with clear sputum. Negative for hemoptysis, TB exposure, fever, chills, sore throat, change in appetite, abnormal weight loss, night sweats, or unusual fatigue. + Clear rhinorrhea and postnasal drainage.  She has been evaluated for this problem, benzonatate 100 mg twice daily were recommended. Medication helps some but the problem has not resolved. She has not identified exacerbating or alleviating factors. Some times aggravated by talking. Mild dysphonia at the end of the day. Negative for dysphagia or stridor.  Cough does not interfere with sleep.  CXR 05/29/19: Negative for acute cardiopulmonary process.  She reports shortness of breath, "all the time" and for "a while", feeling "weak" since hospital discharge. "Little" wheezing at night when lying down. + Former smoker.  She also mentions some chest discomfort, like "butterflies", following with cardiologist.States that she has been prescribed medication for this problem.  -GERD: She has not noted  heartburn or acid reflux. Currently she is on Protonix 40 mg twice daily.  According to the patient, she has an appointment with GI and planning on undergoing EGD. Negative for abdominal pain, nausea, vomiting, changes in bowel habits, or melena.  She mentions that she is taking CBD oil for OA, she cannot take NSAID's because "ulcers."  -She is concerned about cough being related with enlarged thyroid.   According to patient, during hospitalization she was recommended to follow with endocrinologist.  She was also told that enlarged thyroid could be contributing to her cough.  Chest CT done on 05/08/19 Enlarged multinodular thyroid goiter, previously evaluated and followed by ultrasound.  Lab Results  Component Value Date   TSH 0.526 05/09/2019   Thyroid US done on 10/03/2018:Multinodular thyroid. Right upper thyroid nodules (labeled 3 and labeled 4) both meet criteria for surveillance, as designated by the newly established ACR TI-RADS criteria. Surveillance ultrasound study recommended to be performed annually up to 5 years.  She denies orthopnea and PND.  Observations/Objective: Patient sounds cheerful and well on the phone. I do not appreciate any SOB. Speech and thought processing are grossly intact. Patient reported vitals:  Assessment and Plan: Orders Placed This Encounter  Procedures  . Ambulatory referral to Endocrinology    1. Gastrointestinal hemorrhage with hematemesis This problem could be the major contributing factor to the cough. Continue Protonix 40 mg twice daily. Following with gastroenterologist. Continue GERD precautions.  2. Multinodular thyroid Even thought not very common cause of cough, goiter needs to be considered in the differential Dx's. Endocrinology referral placed as requested.  3. Cough, persistent We discussed possible etiologies of cough, including GERD, allergies,HF,ILD,COPD among some. She is already on inhalers, PPI. She has had some  work up done , so I do not think further testing is needed at this time. She can increase dose of benzonatate from 20 mg daily to 400 mg daily. Instructed about warning signs.  Follow Up Instructions:  Return if symptoms worsen or fail to improve.  I did not refer this patient for an OV in the next 24 hours for this/these issue(s).  I discussed the assessment and treatment plan with the patient. Ms Cortorreal was provided an opportunity to ask questions and all were answered. She agreed with the plan and demonstrated an understanding of the instructions.   I provided 15-16 minutes of non-face-to-face time during this encounter.   Selena Martinique, MD

## 2019-06-10 NOTE — Telephone Encounter (Signed)
-----   Message from Irving Copas., MD sent at 06/09/2019  5:30 AM EDT -----   ----- Message ----- From: Noralyn Pick, NP Sent: 06/08/2019  11:01 AM EDT To: Irving Copas., MD

## 2019-06-11 ENCOUNTER — Telehealth: Payer: Self-pay | Admitting: *Deleted

## 2019-06-11 DIAGNOSIS — R059 Cough, unspecified: Secondary | ICD-10-CM

## 2019-06-11 NOTE — Telephone Encounter (Signed)
[facility] faxed a refill request for Benzonatate 100mg  capsules.  Message sent to PCP.

## 2019-06-11 NOTE — Telephone Encounter (Addendum)
Patient with diagnosis of A Fib on Eliquis for anticoagulation.    Procedure: Endoscopy  Date of procedure: TBD  CHADS2-VASc score of  5 (CHF, HTN, AGE x 2, female)   CrCl 76 mL/min per adjusted body weight  Per office protocol, patient can hold Eliquis for 1-2 days prior to procedure.

## 2019-06-11 NOTE — Telephone Encounter (Signed)
How is her cough doing? Any improvement at this point? I am ok with refill, but I want to make sure she is feeling better.

## 2019-06-12 ENCOUNTER — Ambulatory Visit: Payer: Medicare Other | Admitting: Cardiovascular Disease

## 2019-06-12 ENCOUNTER — Encounter: Payer: Self-pay | Admitting: Cardiovascular Disease

## 2019-06-12 ENCOUNTER — Other Ambulatory Visit: Payer: Self-pay

## 2019-06-12 VITALS — BP 110/64 | HR 77 | Ht 66.5 in | Wt 199.8 lb

## 2019-06-12 DIAGNOSIS — I10 Essential (primary) hypertension: Secondary | ICD-10-CM | POA: Diagnosis not present

## 2019-06-12 DIAGNOSIS — I480 Paroxysmal atrial fibrillation: Secondary | ICD-10-CM | POA: Diagnosis not present

## 2019-06-12 DIAGNOSIS — I712 Thoracic aortic aneurysm, without rupture, unspecified: Secondary | ICD-10-CM

## 2019-06-12 MED ORDER — BENZONATATE 100 MG PO CAPS: 100.0000 mg | ORAL_CAPSULE | Freq: Two times a day (BID) | ORAL | 0 refills | Status: DC | PRN

## 2019-06-12 MED ORDER — MONTELUKAST SODIUM 10 MG PO TABS
10.0000 mg | ORAL_TABLET | Freq: Every day | ORAL | 0 refills | Status: DC
Start: 2019-06-12 — End: 2019-09-03

## 2019-06-12 NOTE — Telephone Encounter (Signed)
Spoke with the pt and informed her of the message below.  Patient agreed to try Singulair and is aware the Rx for this and Tessalon perles was sent to the pharmacy.  Patient stated she has a lot going on right now and will call back for an appt with an allergist if needed.  Patient asked what she should do about the goiter as she was diagnosed with this at the hospital.  I was going to schedule a phone visit for a follow up with Dr Ethlyn Gallery and noticed that Dr Martinique entered a referral to endo after a visit on 5/17.  Patient was advised of this and is aware someone will call with appt info.

## 2019-06-12 NOTE — Telephone Encounter (Signed)
Patient called back and stated she still has a productive cough with clear-white sputum.  Patient states she was seen by the cardiologist today who told her he feels the cough is due to allergies and he told her the lungs sounded fine.  Message sent to PCP.

## 2019-06-12 NOTE — Progress Notes (Signed)
Chief Complaint  Patient presents with  . Follow-up    atrial fibrillation   History of Present Illness: 84 yo female with history of HTN, breast cancer, Non-Hodgkins lymphoma, arthritis, thoracic aortic aneurysm and atrial fibrillation who is here today for cardiac follow up. I saw her as a new consult for the evaluation of dyspnea and thoracic aortic aneurysm in September 2019. She was seen in the ED at [facility] August 2019 with c/o dyspnea for 2 months. D-dimer was elevated. CTA chest negative for PE. There was a 4.2 cm thoracic aortic aneurysm. No evidence of aortic dissection. There was no underlying lung disease. Echo August 2019 with mild LVH, normal LV systolic function with [id number]. There was grade 2 diastolic dysfunction. She had breast cancer March 2013 treated with right mastectomy and chemotherapy. She had remote atrial flutter in 2008 and had an ablation. At her first visit with me in September 2019 she denied palpitations or chest pain but did endorse dyspnea with minimal exertion. She was seen in the pulmonary office by Dr. Melvyn Novas but she is unclear if she was felt to have lung disease. Cardiac monitor February 2020 with PACs, PVCs and SVT. She had a syncopal event in June 2020. She was seen by Dr. Lovena Le and a loop recorder was placed August 2020. She was found to have atrial fibrillation and was started Eliquis. She had bright red emesis one month ago and was admitted. EGD showed mild abnormalities in the esophagus and multiple gastric ulcers. She was started on a PPI.   She is here today for follow up. The patient denies any chest pain, dyspnea, palpitations, lower extremity edema, orthopnea, PND. She has occasional dizziness. She has an ongoing cough. Chest x-ray 05/29/19 with no infiltrates. No fever at home.   Primary Care Physician: Caren Macadam, MD  Past Medical History:  Diagnosis Date  . Anemia    PMH  . Aneurysm (arteriovenous) of coronary vessels   . Atrial  fibrillation ([place]) 01/01/2007  . BACK PAIN, UPPER 07/09/2008  . Breast cancer, stage 1 ([place])   . Bruises easily   . Cataract    history  . Complication of anesthesia   . Diverticulitis   . DIVERTICULOSIS, COLON 12/26/2006  . Essential hypertension 10/27/2017  . Follicular non-Hodgkin's lymphoma ([place])   . Goiter    multi-nodular  . Hx of colonoscopy 2009  . LIVER FUNCTION TESTS, ABNORMAL, HX OF 12/26/2007  . Memory loss 05/16/2007  . OSTEOARTHRITIS 12/26/2006   takes Diclofenac daily but has stopped for surgery  . PONV (postoperative nausea and vomiting)   . Shortness of breath 05/28/2019  . Skin cancer   . VERTIGO 09/07/2009  . Wears dentures   . Wears glasses   . Wears hearing aid    bilateral    Past Surgical History:  Procedure Laterality Date  . ABDOMINAL HYSTERECTOMY    . ABLATION OF DYSRHYTHMIC FOCUS  2009  . APPENDECTOMY    . AUGMENTATION MAMMAPLASTY Right 2013  . BIOPSY  05/07/2019   Procedure: BIOPSY;  Surgeon: Rush Landmark Telford Nab., MD;  Location: [facility];  Service: Gastroenterology;;  . BREAST BIOPSY  2013  . BREAST RECONSTRUCTION  04/14/2011   Procedure: BREAST RECONSTRUCTION;  Surgeon: Crissie Reese, MD;  Location: [facility];  Service: Plastics;  Laterality: Right;  Placement of Right Breast Tissue Expander with  use of Flex HD for Breast Reconstruction  . BREAST SURGERY     right sm, snbx  . CATARACT EXTRACTION W/  INTRAOCULAR LENS  IMPLANT, BILATERAL  2010   bilateral  . COLONOSCOPY    . COLONOSCOPY    . ESOPHAGOGASTRODUODENOSCOPY (EGD) WITH PROPOFOL N/A 05/07/2019   Procedure: ESOPHAGOGASTRODUODENOSCOPY (EGD) WITH PROPOFOL;  Surgeon: Rush Landmark Telford Nab., MD;  Location: [facility];  Service: Gastroenterology;  Laterality: N/A;  . KNEE SURGERY  2004/2010   arthroscopic/ bilateral knee replacements  . LOOP RECORDER IMPLANT    . MASTECTOMY Right 2013  . PORT-A-CATH REMOVAL N/A 12/07/2016   Procedure: REMOVAL PORT-A-CATH;  Surgeon: Rolm Bookbinder, MD;   Location: [facility];  Service: General;  Laterality: N/A;  . PORTACATH PLACEMENT Right 08/11/2014   Procedure: INSERTION PORT-A-CATH WITH ULTRASOUND;  Surgeon: Rolm Bookbinder, MD;  Location: [facility];  Service: General;  Laterality: Right;  . [place]   right  . tmi  1998  . TONSILLECTOMY      Current Outpatient Medications  Medication Sig Dispense Refill  . albuterol (PROVENTIL) (2.5 MG/3ML) 0.083% nebulizer solution Take 3 mLs (2.5 mg total) by nebulization every 6 (six) hours as needed for wheezing or shortness of breath. 150 mL 1  . albuterol (VENTOLIN HFA) 108 (90 Base) MCG/ACT inhaler Inhale 2 puffs into the lungs every 6 (six) hours as needed for wheezing or shortness of breath. 18 g 2  . amLODipine (NORVASC) 5 MG tablet TAKE 1 TABLET BY MOUTH DAILY 90 tablet 2  . apixaban (ELIQUIS) 5 MG TABS tablet Take 1 tablet (5 mg total) by mouth 2 (two) times daily. 60 tablet   . benzonatate (TESSALON PERLES) 100 MG capsule Take 1 capsule (100 mg total) by mouth 2 (two) times daily as needed. 20 capsule 0  . fexofenadine (ALLEGRA) 180 MG tablet Take 180 mg by mouth daily.    . fluticasone (FLONASE) 50 MCG/ACT nasal spray Place 2 sprays into both nostrils daily. 16 g 2  . fluticasone furoate-vilanterol (BREO ELLIPTA) 100-25 MCG/INH AEPB Inhale 1 puff into the lungs daily. Rinse mouth with water after use 60 each 2  . furosemide (LASIX) 20 MG tablet Take 1 tablet (20 mg total) by mouth daily. 90 tablet 1  . meclizine (ANTIVERT) 25 MG tablet TAKE 1 TABLET(25 MG) BY MOUTH EVERY 6 HOURS AS NEEDED FOR DIZZINESS 100 tablet 2  . metoprolol tartrate (LOPRESSOR) 25 MG tablet Take 1 tablet (25 mg total) by mouth 3 (three) times daily. May take 1 extra tablet as needed for heart rate over 100 315 tablet 1  . Multiple Vitamins-Minerals (OCUVITE EXTRA) TABS Take 1 tablet by mouth in the morning and at bedtime.    . ondansetron (ZOFRAN) 4 MG tablet Take 1 tablet (4 mg total) by mouth every 8 (eight)  hours as needed for nausea or vomiting. 20 tablet 0  . pantoprazole (PROTONIX) 40 MG tablet Take 1 tablet (40 mg total) by mouth 2 (two) times daily. 60 tablet 3   No current facility-administered medications for this visit.    Allergies  Allergen Reactions  . Codeine Phosphate Other (See Comments)    Hallucinations  . Adhesive [Tape]     Electrodes for EKG- skin blistering, erythema  . Codeine Other (See Comments)    Hallucinations  Other reaction(s): Other    Social History   Socioeconomic History  . Marital status: Widowed    Spouse name: Not on file  . Number of children: Not on file  . Years of education: Not on file  . Highest education level: Not on file  Occupational History  .  Not on file  Tobacco Use  . Smoking status: Former Smoker    Packs/day: 0.10    Years: 20.00    Pack years: 2.00    Types: Cigarettes    Quit date: 01/24/1978    Years since quitting: 41.4  . Smokeless tobacco: Never Used  Substance and Sexual Activity  . Alcohol use: No  . Drug use: No  . Sexual activity: Yes    Birth control/protection: Surgical  Other Topics Concern  . Not on file  Social History Narrative  . Not on file   Social Determinants of Health   Financial Resource Strain:   . Difficulty of Paying Living Expenses:   Food Insecurity:   . Worried About Charity fundraiser in the Last Year:   . Arboriculturist in the Last Year:   Transportation Needs:   . Film/video editor (Medical):   [name] Lack of Transportation (Non-Medical):   Physical Activity:   . Days of Exercise per Week:   . Minutes of Exercise per Session:   Stress:   . Feeling of Stress :   Social Connections:   . Frequency of Communication with Friends and Family:   . Frequency of Social Gatherings with Friends and Family:   . Attends Religious Services:   . Active Member of Clubs or Organizations:   . Attends Archivist Meetings:   [name] Marital Status:   Intimate Partner Violence:   . Fear  of Current or Ex-Partner:   . Emotionally Abused:   [name] Physically Abused:   . Sexually Abused:     Family History  Problem Relation Age of Onset  . Colon cancer Mother   . Cancer Mother        colon  . Colon cancer Maternal Aunt   . Cancer Maternal Uncle   . Prostate cancer Maternal Uncle   . Prostate cancer Maternal Uncle   . Other Maternal Uncle   . Cancer Sister        kidney  . Cancer Brother        lymph node  . Anesthesia problems Neg Hx   . Hypotension Neg Hx   . Malignant hyperthermia Neg Hx   . Pseudochol deficiency Neg Hx   . Breast cancer Neg Hx     Review of Systems:  As stated in the HPI and otherwise negative.   BP 110/64   Pulse 77   Ht 5' 6.5" (1.689 m)   Wt 199 lb 12.8 oz (90.6 kg)   SpO2 97%   BMI 31.77 kg/m   Physical Examination: General: Well developed, well nourished, NAD  HEENT: OP clear, mucus membranes moist  SKIN: warm, dry. No rashes. Neuro: No focal deficits  Musculoskeletal: Muscle strength 5/5 all ext  Psychiatric: Mood and affect normal  Neck: No JVD, no carotid bruits, no thyromegaly, no lymphadenopathy.  Lungs:Clear bilaterally, no wheezes, rhonci, crackles Cardiovascular: Regular rate and rhythm. No murmurs, gallops or rubs. Abdomen:Soft. Bowel sounds present. Non-tender.  Extremities: No lower extremity edema. Pulses are 2 + in the bilateral DP/PT.   Lipid Panel    Component Value Date/Time   CHOL 169 08/28/2017 1410   TRIG 103.0 08/28/2017 1410   HDL 43.20 08/28/2017 1410   CHOLHDL 4 08/28/2017 1410   VLDL 20.6 08/28/2017 1410   LDLCALC 105 (H) 08/28/2017 1410   LDLDIRECT 133.0 08/23/2016 1403     Wt Readings from Last 3 Encounters:  06/12/19 199 lb 12.8 oz (90.6  kg)  06/10/19 199 lb (90.3 kg)  06/07/19 200 lb 6.4 oz (90.9 kg)     Other studies Reviewed: Additional studies/ records that were reviewed today include: . Review of the above records demonstrates:    Assessment and Plan:   1. Thoracic aortic  aneurysm: Her thoracic aorta is slightly dilated but most recent chest CTA in April 2021 was read as normal caliber ascending aorta.   She was seen last year in the CT surgery office by Dr. Julien Girt and told there was nothing to do. We may repeat a chest CTA in several years.   2. HTN: BP is controlled. Continue current therapy  3. Atrial fib paroxysmal/SVT/PACs/PVCs: Rare palpitations. Rate controlled atrial fib today. Continue beta blocker and Eliquis.  F/U Dr. Lovena Le six months with ILR in place.   Current medicines are reviewed at length with the patient today.  The patient does not have concerns regarding medicines.  The following changes have been made:  no change  Labs/ tests ordered today include:   No orders of the defined types were placed in this encounter.    Disposition:   FU with me in 12 months   Signed, Lauree Chandler, MD 06/12/2019 10:59 AM    [facility] [address], [place], [place]  [address] Phone: [phone]; Fax: [phone]

## 2019-06-12 NOTE — Telephone Encounter (Signed)
   Primary Cardiologist: Lauree Chandler, MD  Chart reviewed as part of pre-operative protocol coverage. Given past medical history and time since last visit, based on ACC/AHA guidelines, Selena [name] would be at acceptable risk for the planned procedure without further cardiovascular testing.   Per pharmacy:  Patient with diagnosis of A Fib on Eliquis for anticoagulation.    Procedure: Endoscopy  Date of procedure: TBD  CHADS2-VASc score of  5 (CHF, HTN, AGE x 2, female)   CrCl 76 mL/min per adjusted body weight  Per office protocol, patient can hold Eliquis for 1-2 days prior to procedure.    I will route this recommendation to the requesting party via Epic fax function and remove from pre-op pool.  Please call with questions.  Kathyrn Drown, NP 06/12/2019, 5:20 PM

## 2019-06-12 NOTE — Telephone Encounter (Signed)
'  m glad to hear that; and she is also on reflux precautions, so if that is playing any role, we are covering the base.  She is also taking allergy medications, so if cough is bothering her, we can always send to an allergist if she would like.  One other option may be adding on Singulair 10 mg daily?  This is an allergy medication, but she could take along with her other allergy medications and it might help decrease that cough.  I am okay with sending in 90-day supply Singulair for her to try if she wants to do this as well as refill for Tessalon Perles if she needs them/finds it helpful.

## 2019-06-12 NOTE — Telephone Encounter (Signed)
Left a detailed message at the pts cell number with the information below and asked that she return my call.

## 2019-06-12 NOTE — Telephone Encounter (Signed)
Notified the patient that her Cardiologist suggested that she stop her Eliquis 2 days prior to her procedure. The patient stated she already new because she was at their office today and they advised her. She was grateful for the follow up and repeated back all instructions.

## 2019-06-12 NOTE — Patient Instructions (Addendum)
Medication Instructions:  NO CHANGES *If you need a refill on your cardiac medications before your next appointment, please call your pharmacy*   Lab Work: NONE If you have labs (blood work) drawn today and your tests are completely normal, you will receive your results only by: [name] MyChart Message (if you have MyChart) OR . A paper copy in the mail If you have any lab test that is abnormal or we need to change your treatment, we will call you to review the results.   Testing/Procedures: NONE   Follow-Up: At [facility], you and your health needs are our priority.  As part of our continuing mission to provide you with exceptional heart care, we have created designated Provider Care Teams.  These Care Teams include your primary Cardiologist (physician) and Advanced Practice Providers (APPs -  Physician Assistants and Nurse Practitioners) who all work together to provide you with the care you need, when you need it.  We recommend signing up for the patient portal called "MyChart".  Sign up information is provided on this After Visit Summary.  MyChart is used to connect with patients for Virtual Visits (Telemedicine).  Patients are able to view lab/test results, encounter notes, upcoming appointments, etc.  Non-urgent messages can be sent to your provider as well.   To learn more about what you can do with MyChart, go to [email or web address].    Your next appointment:   12 month(s)  The format for your next appointment:   In Person  Provider:   You may see Lauree Chandler, MD or one of the following Advanced Practice Providers on your designated Care Team:    Melina Copa, PA-C  Ermalinda Barrios, PA-C    Other Instructions We will help you schedule a follow up with Dr. Lovena Le.  This should be in November, 2021.

## 2019-06-20 ENCOUNTER — Telehealth: Payer: Self-pay | Admitting: *Deleted

## 2019-06-20 NOTE — Telephone Encounter (Signed)
Pt is here today for her repeat EGD.  According to our schedule she is due to come in 08-20-19- for a 3 month repeat EGD to recheck ulcer healing.  She has been off her Eliquis since 06-18-19.  Her paperwork states her procedure is 05-21-19, but pt says, "the nurse crossed out the 05-21-19 date and wrote in for me to come on 06-20-19." Dr. Rush Landmark isn't here today so I asked Dr. Hilarie Fredrickson to look into pt's chart.  He states it is too soon to assess healing, so pt should resume her Eliquis today at her usual dose and come back for procedure on 08-20-19.  Ok to hold Eliquis for 2 days at that time.  New instructions given to pt and understanding voiced.  I apologized for the error

## 2019-07-08 ENCOUNTER — Ambulatory Visit (INDEPENDENT_AMBULATORY_CARE_PROVIDER_SITE_OTHER): Payer: Medicare Other | Admitting: *Deleted

## 2019-07-08 DIAGNOSIS — R55 Syncope and collapse: Secondary | ICD-10-CM | POA: Diagnosis not present

## 2019-07-09 LAB — CUP PACEART REMOTE DEVICE CHECK
Date Time Interrogation Session: 20210614105822
Implantable Pulse Generator Implant Date: 20200827

## 2019-07-10 NOTE — Progress Notes (Signed)
Carelink Summary Report / Loop Recorder 

## 2019-07-15 ENCOUNTER — Ambulatory Visit: Payer: Medicare Other | Admitting: Cardiothoracic Surgery

## 2019-07-22 ENCOUNTER — Ambulatory Visit: Payer: Medicare Other | Admitting: Cardiothoracic Surgery

## 2019-07-29 ENCOUNTER — Other Ambulatory Visit: Payer: Self-pay | Admitting: Family Medicine

## 2019-07-29 DIAGNOSIS — R059 Cough, unspecified: Secondary | ICD-10-CM

## 2019-08-08 ENCOUNTER — Ambulatory Visit (INDEPENDENT_AMBULATORY_CARE_PROVIDER_SITE_OTHER): Payer: Medicare Other | Admitting: *Deleted

## 2019-08-08 DIAGNOSIS — R55 Syncope and collapse: Secondary | ICD-10-CM | POA: Diagnosis not present

## 2019-08-08 LAB — CUP PACEART REMOTE DEVICE CHECK
Date Time Interrogation Session: 20210715080848
Implantable Pulse Generator Implant Date: 20200827

## 2019-08-12 NOTE — Progress Notes (Signed)
Carelink Summary Report / Loop Recorder 

## 2019-08-20 ENCOUNTER — Encounter: Payer: Self-pay | Admitting: Gastroenterology

## 2019-08-20 ENCOUNTER — Other Ambulatory Visit: Payer: Self-pay

## 2019-08-20 ENCOUNTER — Ambulatory Visit (AMBULATORY_SURGERY_CENTER): Payer: Medicare Other | Admitting: Gastroenterology

## 2019-08-20 VITALS — BP 106/76 | HR 71 | Temp 96.0°F | Resp 16 | Ht 66.0 in | Wt 200.0 lb

## 2019-08-20 DIAGNOSIS — K319 Disease of stomach and duodenum, unspecified: Secondary | ICD-10-CM

## 2019-08-20 DIAGNOSIS — K3189 Other diseases of stomach and duodenum: Secondary | ICD-10-CM | POA: Diagnosis not present

## 2019-08-20 DIAGNOSIS — K449 Diaphragmatic hernia without obstruction or gangrene: Secondary | ICD-10-CM

## 2019-08-20 DIAGNOSIS — K922 Gastrointestinal hemorrhage, unspecified: Secondary | ICD-10-CM

## 2019-08-20 MED ORDER — SODIUM CHLORIDE 0.9 % IV SOLN: 500.0000 mL | Freq: Once | INTRAVENOUS | Status: DC

## 2019-08-20 NOTE — Patient Instructions (Signed)
Handout given for Hiatal Hernia.  Continue taking the Pantoprazole twice a day through the end of August, then you may take it once a day after that.  YOU HAD AN ENDOSCOPIC PROCEDURE TODAY AT [facility]:   Refer to the procedure report that was given to you for any specific questions about what was found during the examination.  If the procedure report does not answer your questions, please call your gastroenterologist to clarify.  If you requested that your care partner not be given the details of your procedure findings, then the procedure report has been included in a sealed envelope for you to review at your convenience later.  YOU SHOULD EXPECT: Some feelings of bloating in the abdomen. Passage of more gas than usual.  Walking can help get rid of the air that was put into your GI tract during the procedure and reduce the bloating. If you had a lower endoscopy (such as a colonoscopy or flexible sigmoidoscopy) you may notice spotting of blood in your stool or on the toilet paper. If you underwent a bowel prep for your procedure, you may not have a normal bowel movement for a few days.  Please Note:  You might notice some irritation and congestion in your nose or some drainage.  This is from the oxygen used during your procedure.  There is no need for concern and it should clear up in a day or so.  SYMPTOMS TO REPORT IMMEDIATELY:   Following upper endoscopy (EGD)  Vomiting of blood or coffee ground material  New chest pain or pain under the shoulder blades  Painful or persistently difficult swallowing  New shortness of breath  Fever of 100F or higher  Black, tarry-looking stools  For urgent or emergent issues, a gastroenterologist can be reached at any hour by calling [phone]. Do not use MyChart messaging for urgent concerns.    DIET:  We do recommend a small meal at first, but then you may proceed to your regular diet.  Drink plenty of fluids but you should avoid  alcoholic beverages for 24 hours.  ACTIVITY:  You should plan to take it easy for the rest of today and you should NOT DRIVE or use heavy machinery until tomorrow (because of the sedation medicines used during the test).    FOLLOW UP: Our staff will call the number listed on your records 48-72 hours following your procedure to check on you and address any questions or concerns that you may have regarding the information given to you following your procedure. If we do not reach you, we will leave a message.  We will attempt to reach you two times.  During this call, we will ask if you have developed any symptoms of COVID 19. If you develop any symptoms (ie: fever, flu-like symptoms, shortness of breath, cough etc.) before then, please call [phone].  If you test positive for Covid 19 in the 2 weeks post procedure, please call and report this information to [place].    If any biopsies were taken you will be contacted by phone or by letter within the next 1-3 weeks.  Please call us at [phone] if you have not heard about the biopsies in 3 weeks.    SIGNATURES/CONFIDENTIALITY: You and/or your care partner have signed paperwork which will be entered into your electronic medical record.  These signatures attest to the fact that that the information above on your After Visit Summary has been reviewed and is understood.  Full responsibility of the confidentiality of this discharge information lies with you and/or your care-partner.

## 2019-08-20 NOTE — Progress Notes (Signed)
Pt's states no medical or surgical changes since previsit or office visit. 

## 2019-08-20 NOTE — Op Note (Signed)
[facility] Patient Name: Selena [name] Procedure Date: 08/20/2019 10:29 AM MRN: [id number] Endoscopist: Justice Britain , MD Age: 84 Referring MD:  Date of Birth: 03-11-1935 Gender: Female Account #: [id number] Procedure:                Upper GI endoscopy Indications:              Follow-up of gastric ulcer Medicines:                Monitored Anesthesia Care Procedure:                Pre-Anesthesia Assessment:                           - Prior to the procedure, a History and Physical                            was performed, and patient medications and                            allergies were reviewed. The patient's tolerance of                            previous anesthesia was also reviewed. The risks                            and benefits of the procedure and the sedation                            options and risks were discussed with the patient.                            All questions were answered, and informed consent                            was obtained. Prior Anticoagulants: The patient has                            taken Eliquis (apixaban), last dose was day of                            procedure. ASA Grade Assessment: III - A patient                            with severe systemic disease. After reviewing the                            risks and benefits, the patient was deemed in                            satisfactory condition to undergo the procedure.                           After obtaining informed consent, the endoscope was  passed under direct vision. Throughout the                            procedure, the patient's blood pressure, pulse, and                            oxygen saturations were monitored continuously. The                            Endoscope was introduced through the mouth, and                            advanced to the second part of duodenum. The upper                            GI endoscopy was  accomplished without difficulty.                            The patient tolerated the procedure. Scope In: Scope Out: Findings:                 No gross lesions were noted in the entire esophagus.                           The Z-line was irregular.                           A 3 cm hiatal hernia was found. The proximal extent                            of the gastric folds (end of tubular esophagus) was                            36 cm from the incisors. The hiatal narrowing was                            39 cm from the incisors. The Z-line was 36 cm from                            the incisors.                           A medium healed ulcer was found in the gastric                            fundus.                           Patchy moderately erythematous mucosa without                            bleeding was found in the gastric body and in the  gastric antrum. Biopsies were taken with a cold                            forceps for histology.                           No other gross lesions were noted in the entire                            examined stomach.                           No gross lesions were noted in the duodenal bulb,                            in the first portion of the duodenum and in the                            second portion of the duodenum. Complications:            No immediate complications. Estimated Blood Loss:     Estimated blood loss was minimal. Impression:               - No gross lesions in esophagus.                           - Z-line irregular.                           - 3 cm hiatal hernia.                           - Scar in the gastric fundus.                           - Erythematous mucosa in the gastric body and                            antrum. Biopsied.                           - No gross lesions in the duodenal bulb, in the                            first portion of the duodenum and in the second                             portion of the duodenum. Recommendation:           - The patient will be observed post-procedure,                            until all discharge criteria are met.                           - Discharge patient to home.                           -  Resume previous diet.                           - Continue present medications.                           - Continue PPI twice daily through end of August                            then may transition to once daily PPI. This should                            be maintained thereafter due to history of GI                            bleeding and ulcer disease, especially while still                            on blood thinners.                           - Follow up in clinic in 3-4 months, to consider                            role of 1 final colonoscopy before ending                            surveillance/screening due to her age.                           - The findings and recommendations were discussed                            with the patient.                           - The findings and recommendations were discussed                            with the patient's family. Justice Britain, MD 08/20/2019 11:00:51 AM

## 2019-08-20 NOTE — Progress Notes (Signed)
PT taken to PACU. Monitors in place. VSS. Report given to RN. 

## 2019-08-20 NOTE — Progress Notes (Signed)
Called to room to assist during endoscopic procedure.  Patient ID and intended procedure confirmed with present staff. Received instructions for my participation in the procedure from the performing physician.  

## 2019-08-21 ENCOUNTER — Telehealth: Payer: Self-pay | Admitting: Emergency Medicine

## 2019-08-21 NOTE — Telephone Encounter (Signed)
Confirmed patient was having endoscopy 08/20/19 and had medications before pause occurred and procedure started at 1030. Patient recalls no symptoms during event.

## 2019-08-22 ENCOUNTER — Telehealth: Payer: Self-pay

## 2019-08-22 NOTE — Telephone Encounter (Signed)
  Follow up Call-  Call back number 08/20/2019  Post procedure Call Back phone  # [phone]  Permission to leave phone message Yes  Some recent data might be hidden     Patient questions:  Do you have a fever, pain , or abdominal swelling? No. Pain Score  0 *  Have you tolerated food without any problems? Yes.    Have you been able to return to your normal activities? Yes.    Do you have any questions about your discharge instructions: Diet   No. Medications  No. Follow up visit  No.  Do you have questions or concerns about your Care? No.  Actions: * If pain score is 4 or above: No action needed, pain <4.  1. Have you developed a fever since your procedure? no  2.   Have you had an respiratory symptoms (SOB or cough) since your procedure? no  3.   Have you tested positive for COVID 19 since your procedure no  4.   Have you had any family members/close contacts diagnosed with the COVID 19 since your procedure?  no   If yes to any of these questions please route to Joylene John, RN and Erenest Rasher, RN

## 2019-08-22 NOTE — Telephone Encounter (Signed)
1st follow up call made.  The call was answered but hung up when RN asked to speak with the patient.

## 2019-08-25 ENCOUNTER — Encounter: Payer: Self-pay | Admitting: Gastroenterology

## 2019-08-26 ENCOUNTER — Other Ambulatory Visit: Payer: Self-pay | Admitting: *Deleted

## 2019-08-26 ENCOUNTER — Encounter: Payer: Self-pay | Admitting: Internal Medicine

## 2019-08-26 ENCOUNTER — Ambulatory Visit: Payer: Medicare Other | Admitting: Internal Medicine

## 2019-08-26 ENCOUNTER — Other Ambulatory Visit: Payer: Self-pay

## 2019-08-26 VITALS — BP 138/80 | HR 80 | Ht 66.0 in | Wt 199.0 lb

## 2019-08-26 DIAGNOSIS — E042 Nontoxic multinodular goiter: Secondary | ICD-10-CM

## 2019-08-26 MED ORDER — FUROSEMIDE 20 MG PO TABS: 20.0000 mg | ORAL_TABLET | Freq: Every day | ORAL | 2 refills | Status: DC

## 2019-08-26 NOTE — Patient Instructions (Signed)
-   We will order a follow up thyroid ultrasound

## 2019-08-26 NOTE — Progress Notes (Signed)
Name: Selena [name]  MRN/ DOB: [id number], 03/21/1935    Age/ Sex: 84 y.o., female    PCP: Selena Macadam, MD   Reason for Endocrinology Evaluation: MNG     Date of Initial Endocrinology Evaluation: 08/26/2019     HPI: Ms. Selena [name] is a 84 y.o. female with a past medical history of A. Fib, HFpEF, Hx of lymphoma and GERD. The patient presented for initial endocrinology clinic visit on 08/26/2019 for consultative assistance with her MNG.   Pt has been diagnosed with MNG in 2007 when she was noted to have an incidental finding of MNG, this prompted an ultrasound with a repeat in 2020.   She denies any local neck swelling, has occasional burping with water drinking, but no dysphagia . She endorses shortness of breath that is attributed to cardiac issues, sob is exertional.  Cough has resolved.    Denies constipation or diarrhea.  Has occasional palpitations  Denies depression and anxiety    Mother with thyroid disease   HISTORY:  Past Medical History:  Past Medical History:  Diagnosis Date  . Anemia    PMH  . Aneurysm (arteriovenous) of coronary vessels   . Atrial fibrillation ([place]) 01/01/2007  . BACK PAIN, UPPER 07/09/2008  . Breast cancer, stage 1 ([place])   . Bruises easily   . Cataract    history  . Complication of anesthesia   . Diverticulitis   . DIVERTICULOSIS, COLON 12/26/2006  . Essential hypertension 10/27/2017  . Follicular non-Hodgkin's lymphoma ([place])   . Goiter    multi-nodular  . Hx of colonoscopy 2009  . LIVER FUNCTION TESTS, ABNORMAL, HX OF 12/26/2007  . Memory loss 05/16/2007  . OSTEOARTHRITIS 12/26/2006   takes Diclofenac daily but has stopped for surgery  . PONV (postoperative nausea and vomiting)   . Shortness of breath 05/28/2019  . Skin cancer   . VERTIGO 09/07/2009  . Wears dentures   . Wears glasses   . Wears hearing aid    bilateral   Past Surgical History:  Past Surgical History:  Procedure Laterality Date  . ABDOMINAL  HYSTERECTOMY    . ABLATION OF DYSRHYTHMIC FOCUS  2009  . APPENDECTOMY    . AUGMENTATION MAMMAPLASTY Right 2013  . BIOPSY  05/07/2019   Procedure: BIOPSY;  Surgeon: Rush Landmark Telford Nab., MD;  Location: [facility];  Service: Gastroenterology;;  . BREAST BIOPSY  2013  . BREAST RECONSTRUCTION  04/14/2011   Procedure: BREAST RECONSTRUCTION;  Surgeon: Crissie Reese, MD;  Location: [facility];  Service: Plastics;  Laterality: Right;  Placement of Right Breast Tissue Expander with  use of Flex HD for Breast Reconstruction  . BREAST SURGERY     right sm, snbx  . CATARACT EXTRACTION W/ INTRAOCULAR LENS  IMPLANT, BILATERAL  2010   bilateral  . COLONOSCOPY    . COLONOSCOPY    . ESOPHAGOGASTRODUODENOSCOPY (EGD) WITH PROPOFOL N/A 05/07/2019   Procedure: ESOPHAGOGASTRODUODENOSCOPY (EGD) WITH PROPOFOL;  Surgeon: Rush Landmark Telford Nab., MD;  Location: [facility];  Service: Gastroenterology;  Laterality: N/A;  . KNEE SURGERY  2004/2010   arthroscopic/ bilateral knee replacements  . LOOP RECORDER IMPLANT    . MASTECTOMY Right 2013  . PORT-A-CATH REMOVAL N/A 12/07/2016   Procedure: REMOVAL PORT-A-CATH;  Surgeon: Rolm Bookbinder, MD;  Location: [facility];  Service: General;  Laterality: N/A;  . PORTACATH PLACEMENT Right 08/11/2014   Procedure: INSERTION PORT-A-CATH WITH ULTRASOUND;  Surgeon: Rolm Bookbinder, MD;  Location: [facility];  Service: General;  Laterality: Right;  . [place]   right  . tmi  1998  . TONSILLECTOMY        Social History:  reports that she quit smoking about 41 years ago. Her smoking use included cigarettes. She has a 2.00 pack-year smoking history. She has never used smokeless tobacco. She reports that she does not drink alcohol and does not use drugs.  Family History: family history includes Cancer in her brother, maternal uncle, mother, and sister; Colon cancer in her maternal aunt and mother; Other in her maternal uncle; Prostate cancer in her maternal uncle and  maternal uncle.   HOME MEDICATIONS: Allergies as of 08/26/2019      Reactions   Codeine Phosphate Other (See Comments)   Hallucinations   Adhesive [tape]    Electrodes for EKG- skin blistering, erythema   Codeine Other (See Comments)   Hallucinations  Other reaction(s): Other      Medication List       Accurate as of August 26, 2019  3:05 PM. If you have any questions, ask your nurse or doctor.        albuterol (2.5 MG/3ML) 0.083% nebulizer solution Commonly known as: PROVENTIL Take 3 mLs (2.5 mg total) by nebulization every 6 (six) hours as needed for wheezing or shortness of breath.   albuterol 108 (90 Base) MCG/ACT inhaler Commonly known as: VENTOLIN HFA INHALE 2 PUFFS INTO THE LUNGS EVERY 6 HOURS AS NEEDED FOR WHEEZING OR SHORTNESS OF BREATH   amLODipine 5 MG tablet Commonly known as: NORVASC TAKE 1 TABLET BY MOUTH DAILY   apixaban 5 MG Tabs tablet Commonly known as: Eliquis Take 1 tablet (5 mg total) by mouth 2 (two) times daily.   benzonatate 100 MG capsule Commonly known as: Tessalon Perles Take 1 capsule (100 mg total) by mouth 2 (two) times daily as needed.   Breo Ellipta 100-25 MCG/INH Aepb Generic drug: fluticasone furoate-vilanterol Inhale 1 puff into the lungs daily. Rinse mouth with water after use   fexofenadine 180 MG tablet Commonly known as: ALLEGRA Take 180 mg by mouth daily.   fluticasone 50 MCG/ACT nasal spray Commonly known as: FLONASE Place 2 sprays into both nostrils daily.   furosemide 20 MG tablet Commonly known as: LASIX Take 1 tablet (20 mg total) by mouth daily.   meclizine 25 MG tablet Commonly known as: ANTIVERT TAKE 1 TABLET(25 MG) BY MOUTH EVERY 6 HOURS AS NEEDED FOR DIZZINESS   metoprolol tartrate 25 MG tablet Commonly known as: LOPRESSOR Take 1 tablet (25 mg total) by mouth 3 (three) times daily. May take 1 extra tablet as needed for heart rate over 100   montelukast 10 MG tablet Commonly known as: SINGULAIR Take 1  tablet (10 mg total) by mouth at bedtime.   NON FORMULARY CBD oil   Ocuvite Extra Tabs Take 1 tablet by mouth in the morning and at bedtime.   ondansetron 4 MG tablet Commonly known as: ZOFRAN Take 1 tablet (4 mg total) by mouth every 8 (eight) hours as needed for nausea or vomiting.   pantoprazole 40 MG tablet Commonly known as: PROTONIX Take 1 tablet (40 mg total) by mouth 2 (two) times daily.         REVIEW OF SYSTEMS: A comprehensive ROS was conducted with the patient and is negative except as per HPI and below:  ROS     OBJECTIVE:  VS: BP 138/80 (BP Location: Left Arm, Patient Position: Sitting, Cuff Size: Normal)  Pulse 80   Ht 5\' 6"  (1.676 m)   Wt 199 lb (90.3 kg)   SpO2 98%   BMI 32.12 kg/m    Wt Readings from Last 3 Encounters:  08/26/19 199 lb (90.3 kg)  08/20/19 200 lb (90.7 kg)  06/12/19 199 lb 12.8 oz (90.6 kg)     EXAM: General: Pt appears well and is in NAD  Neck: General: Supple without adenopathy. Thyroid: Thyroid size normal. Right sided fullness noted but no discrete  nodules appreciated.   Lungs: Clear with good BS bilat with no rales, rhonchi, or wheezes  Heart: Auscultation: RRR.  Abdomen: Normoactive bowel sounds, soft, nontender, without masses or organomegaly palpable  Extremities:  BL LE: No pretibial edema normal ROM and strength.  Neuro: Cranial nerves: II - XII grossly intact  Motor: Normal strength throughout DTRs: 2+ and symmetric in UE without delay in relaxation phase  Mental Status: Judgment, insight: Intact Orientation: Oriented to time, place, and person Mood and affect: No depression, anxiety, or agitation     DATA REVIEWED:   Results for MEIAH, ZAMUDIO (MRN [id number]) as of 08/26/2019 08:46  Ref. Range 05/09/2019 10:26  TSH Latest Ref Range: 0.350 - 4.500 uIU/mL 0.526  T4,Free(Direct) Latest Ref Range: 0.61 - 1.12 ng/dL 0.99     Thyroid Ultrasound 10/03/2018 Nodule # 1:  Location: Isthmus; left  Maximum  size: 1.3 cm; Other 2 dimensions: 1.1 cm x 0.8 cm  Composition: spongiform (0)  ACR TI-RADS recommendations:  Spongiform nodule does not meet criteria for surveillance or biopsy  _________________________________________________________  Nodule # 2:  Location: Isthmus; right  Maximum size: 1.4 cm; Other 2 dimensions: 1.3 cm x 0.7 cm  Composition: spongiform (0)  ACR TI-RADS recommendations:  Spongiform nodule does not meet criteria for surveillance or biopsy  _________________________________________________________  Nodule # 3:  Location: Right; Superior  Maximum size: 1.8 cm; Other 2 dimensions: 1.3 cm x 1.0 cm  Composition: cannot determine (2)  Echogenicity: isoechoic (1)  Shape: not taller-than-wide (0)  Margins: ill-defined (0)  Echogenic foci: none (0)  ACR TI-RADS total points: 3.  ACR TI-RADS risk category: TR3 (3 points).  ACR TI-RADS recommendations:  Nodule meets criteria for surveillance  _________________________________________________________  Nodule # 4:  Location: Right; Superior  Maximum size: 1.0 cm; Other 2 dimensions: 1.0 cm x 1.0 cm  Composition: cannot determine (2)  Echogenicity: isoechoic (1)  Shape: taller-than-wide (3)  Margins: ill-defined (0)  Echogenic foci: none (0)  ACR TI-RADS total points: 6.  ACR TI-RADS risk category: TR4 (4-6 points).  ACR TI-RADS recommendations:  Nodule meets criteria for surveillance  _________________________________________________________  Nodule # 5:  Location: Right; Mid  Maximum size: 0.9 cm; Other 2 dimensions: 0.9 cm x 0.8 cm  Composition: cannot determine (2)  Echogenicity: isoechoic (1)  Shape: taller-than-wide (3)  Margins: ill-defined (0)  Echogenic foci: none (0)  ACR TI-RADS total points: 6.  ACR TI-RADS risk category: TR4 (4-6 points).  ACR TI-RADS recommendations:  Nodule does not meet criteria for  surveillance or biopsy  _________________________________________________________  Nodule # 6:  Location: Right; Inferior  Maximum size: 2.2 cm; Other 2 dimensions: 1.8 cm x 1.5 cm  Composition: mixed cystic and solid (1)  Echogenicity: isoechoic (1)  Shape: not taller-than-wide (0)  Margins: ill-defined (0)  Echogenic foci: none (0)  ACR TI-RADS total points: 2.  ACR TI-RADS risk category: TR2 (2 points).  ACR TI-RADS recommendations:  Nodule does not meet criteria for surveillance or biopsy  _________________________________________________________  No adenopathy  IMPRESSION: Multinodular thyroid.  Right upper thyroid nodules (labeled 3 and labeled 4) both meet criteria for surveillance, as designated by the newly established ACR TI-RADS criteria. Surveillance ultrasound study recommended to be performed annually up to 5 years. ASSESSMENT/PLAN/RECOMMENDATIONS:   1. Multinodular Goiter :  - Pt is clinically and biochemically euthyroid  - No local neck symptoms - Previous thyroid ultrasound did not show any suspicious thyroid nodules, will repeat again this year for stability   F/U in 1 yr  Signed electronically by: Mack Guise, MD  [facility]  [facility] [address] [place], [place] [address] Phone: [phone] FAX: [phone]   CC: Selena [name], [address] [place] [address] Phone: [phone] Fax: [phone]   Return to Endocrinology clinic as below: Future Appointments  Date Time Provider [place]  09/09/2019  9:05 AM [facility] DEVICE REMOTES [facility]  10/10/2019  9:05 AM [facility] DEVICE REMOTES [facility]  11/11/2019  9:05 AM [facility] DEVICE REMOTES [facility]  11/29/2019  1:45 PM Evans Lance, MD [facility]  12/12/2019  9:05 AM [facility] DEVICE REMOTES [facility]  [facility]  12/13/2019 10:30 AM [facility] LAB 3 [facility] None  12/13/2019 11:00 AM Nicholas Lose, MD [facility] None  01/13/2020  9:55 AM [facility] DEVICE REMOTES [facility]  08/28/2020  2:00 PM Cassandria Drew, Melanie Crazier, MD [facility] None

## 2019-09-02 ENCOUNTER — Other Ambulatory Visit: Payer: Self-pay | Admitting: Family Medicine

## 2019-09-06 ENCOUNTER — Other Ambulatory Visit: Payer: Self-pay | Admitting: Family Medicine

## 2019-09-06 DIAGNOSIS — Z1231 Encounter for screening mammogram for malignant neoplasm of breast: Secondary | ICD-10-CM

## 2019-09-09 ENCOUNTER — Ambulatory Visit (INDEPENDENT_AMBULATORY_CARE_PROVIDER_SITE_OTHER): Payer: Medicare Other | Admitting: *Deleted

## 2019-09-09 DIAGNOSIS — R55 Syncope and collapse: Secondary | ICD-10-CM

## 2019-09-09 LAB — CUP PACEART REMOTE DEVICE CHECK
Date Time Interrogation Session: 20210816090407
Implantable Pulse Generator Implant Date: 20200827

## 2019-09-11 NOTE — Progress Notes (Signed)
Carelink Summary Report / Loop Recorder 

## 2019-09-12 ENCOUNTER — Telehealth: Payer: Self-pay | Admitting: Gastroenterology

## 2019-09-12 NOTE — Telephone Encounter (Signed)
Left message on machine to call back   Selena [name] [address] [place] [address]  Dear [name],  I am writing to inform you that the biopsies taken during your recent endoscopic examination showed:   Diagnosis Surgical [P], gastric antrum and gastric body - REACTIVE GASTROPATHY WITH HYPERPLASTIC CHANGES - NO H. PYLORI OR INTESTINAL METAPLASIA IDENTIFIED - SEE COMMENT Microscopic Comment Warthin-Starry stain is NEGATIVE for organisms morphologically consistent with Helicobacter pylori.   What does this all mean? Your stomach biopsies show evidence of reactive changes known as gastropathy.  Thankfully, there is no evidence of H. pylori infection or precancerous changes. Please follow the recommended acid reducing medication treatment as I outlined in your procedure report. I would like to see you back in 2 to 4 months to see how you are doing so that we can also have a discussion whether you would like to undergo one final colon cancer screening colonoscopy.    Please call us at Dept: [phone] if you have persistent problems or have questions about your condition that have not been fully answered at this time.  Sincerely,  Irving Copas., MD       [place], [phone]                               2

## 2019-09-12 NOTE — Telephone Encounter (Signed)
Would like to go over path report

## 2019-09-12 NOTE — Telephone Encounter (Signed)
I returned a call to the pt and she has been advised of the information that is in the letter mailed to her.  All her questions were answered and she was advised to call back with any further concerns.  She will call back to make a follow up appt.

## 2019-09-12 NOTE — Telephone Encounter (Signed)
Patient returned your call.

## 2019-10-01 ENCOUNTER — Ambulatory Visit
Admission: RE | Admit: 2019-10-01 | Discharge: 2019-10-01 | Disposition: A | Discharge status: Home / Self Care | Payer: Medicare Other | Source: Ambulatory Visit | Attending: Internal Medicine | Admitting: Internal Medicine

## 2019-10-01 DIAGNOSIS — E042 Nontoxic multinodular goiter: Secondary | ICD-10-CM

## 2019-10-02 ENCOUNTER — Encounter: Payer: Self-pay | Admitting: Internal Medicine

## 2019-10-03 ENCOUNTER — Telehealth: Payer: Self-pay | Admitting: Internal Medicine

## 2019-10-03 NOTE — Telephone Encounter (Signed)
Patient requests to be called at ph# [phone] to be given his ultrasound results (goiter)

## 2019-10-04 NOTE — Telephone Encounter (Signed)
Called pt and gave her MD message. Pt verbalized understanding and already has a 1 year f/u scheduled.

## 2019-10-09 LAB — CUP PACEART REMOTE DEVICE CHECK
Date Time Interrogation Session: 20210915072817
Implantable Pulse Generator Implant Date: 20200827

## 2019-10-10 ENCOUNTER — Ambulatory Visit (INDEPENDENT_AMBULATORY_CARE_PROVIDER_SITE_OTHER): Payer: Medicare Other | Admitting: *Deleted

## 2019-10-10 DIAGNOSIS — R55 Syncope and collapse: Secondary | ICD-10-CM | POA: Diagnosis not present

## 2019-10-11 ENCOUNTER — Other Ambulatory Visit: Payer: Self-pay | Admitting: Nurse Practitioner

## 2019-10-14 NOTE — Progress Notes (Signed)
Carelink Summary Report / Loop Recorder 

## 2019-10-16 ENCOUNTER — Ambulatory Visit
Admission: RE | Admit: 2019-10-16 | Discharge: 2019-10-16 | Disposition: A | Discharge status: Home / Self Care | Payer: Medicare Other | Source: Ambulatory Visit | Attending: Family Medicine | Admitting: Family Medicine

## 2019-10-16 ENCOUNTER — Other Ambulatory Visit: Payer: Self-pay

## 2019-10-16 DIAGNOSIS — Z1231 Encounter for screening mammogram for malignant neoplasm of breast: Secondary | ICD-10-CM

## 2019-10-21 ENCOUNTER — Other Ambulatory Visit: Payer: Self-pay | Admitting: Nurse Practitioner

## 2019-10-22 ENCOUNTER — Encounter: Payer: Self-pay | Admitting: Family Medicine

## 2019-10-22 ENCOUNTER — Ambulatory Visit: Payer: Medicare Other | Admitting: Family Medicine

## 2019-10-22 ENCOUNTER — Other Ambulatory Visit: Payer: Self-pay

## 2019-10-22 VITALS — BP 128/78 | HR 83 | Temp 97.8°F | Ht 66.0 in | Wt 200.6 lb

## 2019-10-22 DIAGNOSIS — M545 Low back pain, unspecified: Secondary | ICD-10-CM

## 2019-10-22 DIAGNOSIS — C8203 Follicular lymphoma grade I, intra-abdominal lymph nodes: Secondary | ICD-10-CM | POA: Diagnosis not present

## 2019-10-22 MED ORDER — LORAZEPAM 0.5 MG PO TABS: ORAL_TABLET | ORAL | 0 refills | Status: DC

## 2019-10-22 NOTE — Patient Instructions (Signed)
Lumbosacral Radiculopathy Lumbosacral radiculopathy is a condition that involves the spinal nerves and nerve roots in the low back and bottom of the spine. The condition develops when these nerves and nerve roots move out of place or become inflamed and cause symptoms. What are the causes? This condition may be caused by:  Pressure from a disk that bulges out of place (herniated disk). A disk is a plate of soft cartilage that separates bones in the spine.  Disk changes that occur with age (disk degeneration).  A narrowing of the bones of the lower back (spinal stenosis).  A tumor.  An infection.  An injury that places sudden pressure on the disks that cushion the bones of your lower spine. What increases the risk? You are more likely to develop this condition if:  You are a female who is 30-50 years old.  You are a female who is 50-60 years old.  You use improper technique when lifting things.  You are overweight or live a sedentary lifestyle.  Your work requires frequent lifting.  You smoke.  You do repetitive activities that strain the spine. What are the signs or symptoms? Symptoms of this condition include:  Pain that goes down from your back into your legs (sciatica), usually on one side of the body. This is the most common symptom. The pain may be worse with sitting, coughing, or sneezing.  Pain and numbness in your legs.  Muscle weakness.  Tingling.  Loss of bladder control or bowel control. How is this diagnosed? This condition may be diagnosed based on:  Your symptoms and medical history.  A physical exam. If the pain is lasting, you may have tests, such as:  MRI scan.  X-ray.  CT scan.  A type of X-ray used to examine the spinal canal after injecting a dye into your spine (myelogram).  A test to measure how electrical impulses move through a nerve (nerve conduction study). How is this treated? Treatment may depend on the cause of the condition and  may include:  Working with a physical therapist.  Taking pain medicine.  Applying heat and ice to affected areas.  Doing stretches to improve flexibility.  Doing exercises to strengthen back muscles.  Having chiropractic spinal manipulation.  Using transcutaneous electrical nerve stimulation (TENS) therapy.  Getting a steroid injection in the spine. In some cases, no treatment is needed. If the condition is long-lasting (chronic), or if symptoms are severe, treatment may involve surgery or lifestyle changes, such as following a weight-loss plan. Follow these instructions at home: Activity  Avoid bending and other activities that make the problem worse.  Maintain a proper position when standing or sitting: ? When standing, keep your upper back and neck straight, with your shoulders pulled back. Avoid slouching. ? When sitting, keep your back straight and relax your shoulders. Do not round your shoulders or pull them backward.  Do not sit or stand in one place for long periods of time.  Take brief periods of rest throughout the day. This will reduce your pain. It is usually better to rest by lying down or standing, not sitting.  When you are resting for longer periods, mix in some mild activity or stretching between periods of rest. This will help to prevent stiffness and pain.  Get regular exercise. Ask your health care provider what activities are safe for you. If you were shown how to do any exercises or stretches, do them as directed by your health care provider.  Do   not lift anything that is heavier than 10 lb (4.5 kg) or the limit that you are told by your health care provider. Always use proper lifting technique, which includes: ? Bending your knees. ? Keeping the load close to your body. ? Avoiding twisting. Managing pain  If directed, put ice on the affected area: ? Put ice in a plastic bag. ? Place a towel between your skin and the bag. ? Leave the ice on for 20  minutes, 2-3 times a day.  If directed, apply heat to the affected area as often as told by your health care provider. Use the heat source that your health care provider recommends, such as a moist heat pack or a heating pad. ? Place a towel between your skin and the heat source. ? Leave the heat on for 20-30 minutes. ? Remove the heat if your skin turns bright red. This is especially important if you are unable to feel pain, heat, or cold. You may have a greater risk of getting burned.  Take over-the-counter and prescription medicines only as told by your health care provider. General instructions  Sleep on a firm mattress in a comfortable position. Try lying on your side with your knees slightly bent. If you lie on your back, put a pillow under your knees.  Do not drive or use heavy machinery while taking prescription pain medicine.  If your health care provider prescribed a diet or exercise program, follow it as directed.  Keep all follow-up visits as told by your health care provider. This is important. Contact a health care provider if:  Your pain does not improve over time, even when taking pain medicines. Get help right away if:  You develop severe pain.  Your pain suddenly gets worse.  You develop increasing weakness in your legs.  You lose the ability to control your bladder or bowel.  You have difficulty walking or balancing.  You have a fever. Summary  Lumbosacral radiculopathy is a condition that occurs when the spinal nerves and nerve roots in the lower part of the spine move out of place or become inflamed and cause symptoms.  Symptoms include pain, numbness, and tingling that go down from your back into your legs (sciatica), muscle weakness, and loss of bladder control or bowel control.  If directed, apply ice or heat to the affected area as told by your health care provider.  Follow instructions about activity, rest, and proper lifting technique. This  information is not intended to replace advice given to you by your health care provider. Make sure you discuss any questions you have with your health care provider. Document Revised: 12/29/2016 Document Reviewed: 12/29/2016 Elsevier Patient Education  2020 [employer]  

## 2019-10-22 NOTE — Progress Notes (Signed)
Established Patient Office Visit  Subjective:  Patient ID: Selena [name], female    DOB: 18-Nov-1935  Age: 84 y.o. MRN: [id number]  CC:  Chief Complaint  Patient presents with   Back Pain    lower back below waist line, getting worse and now pain is going down right leg to her knee, has had cancer in her back    HPI Selena [name] presents for low back pain somewhat diffuse with the past 3 to 4 months and progressive in intensity.  She describes an achy type pain which is bilateral with occasional radiation right lower extremity down to the knee.  No weakness or numbness.  No recent reported weight loss.  No night sweats.  Appetite is fair.  She does have history of breast cancer and also was diagnosed back in 6384 with follicular lymphoma after similar presentation with low back pain.  Her lymphoma was picked up on MRI scan back then.  She is concerned about recurrence.  She did have CT abdomen as well as chest back in April 2021 which showed no acute findings.  She states she went to orthopedics about a month ago and had plain x-rays of the lumbar spine which shows severe arthritic changes.  She has never had any back surgery. No urine or stool incontinence. No history of injury. Recent falls.  Past Medical History:  Diagnosis Date   Anemia    PMH   Aneurysm (arteriovenous) of coronary vessels    Atrial fibrillation ([place]) 01/01/2007   BACK PAIN, UPPER 07/09/2008   Breast cancer, stage 1 ([place])    Bruises easily    Cataract    history   Complication of anesthesia    Diverticulitis    DIVERTICULOSIS, COLON 12/26/2006   Essential hypertension 53/06/4678   Follicular non-Hodgkin's lymphoma ([place])    Goiter    multi-nodular   Hx of colonoscopy 2009   LIVER FUNCTION TESTS, ABNORMAL, HX OF 12/26/2007   Memory loss 05/16/2007   OSTEOARTHRITIS 12/26/2006   takes Diclofenac daily but has stopped for surgery   PONV (postoperative nausea and vomiting)    Shortness  of breath 05/28/2019   Skin cancer    VERTIGO 09/07/2009   Wears dentures    Wears glasses    Wears hearing aid    bilateral    Past Surgical History:  Procedure Laterality Date   ABDOMINAL HYSTERECTOMY     ABLATION OF DYSRHYTHMIC FOCUS  2009   APPENDECTOMY     AUGMENTATION MAMMAPLASTY Right 2013   BIOPSY  05/07/2019   Procedure: BIOPSY;  Surgeon: Irving Copas., MD;  Location: [facility];  Service: Gastroenterology;;   BREAST BIOPSY  2013   BREAST RECONSTRUCTION  04/14/2011   Procedure: BREAST RECONSTRUCTION;  Surgeon: Crissie Reese, MD;  Location: [facility];  Service: Plastics;  Laterality: Right;  Placement of Right Breast Tissue Expander with  use of Flex HD for Breast Reconstruction   BREAST SURGERY     right sm, snbx   CATARACT EXTRACTION W/ INTRAOCULAR LENS  IMPLANT, BILATERAL  2010   bilateral   COLONOSCOPY     COLONOSCOPY     ESOPHAGOGASTRODUODENOSCOPY (EGD) WITH PROPOFOL N/A 05/07/2019   Procedure: ESOPHAGOGASTRODUODENOSCOPY (EGD) WITH PROPOFOL;  Surgeon: Irving Copas., MD;  Location: [facility];  Service: Gastroenterology;  Laterality: N/A;   KNEE SURGERY  2004/2010   arthroscopic/ bilateral knee replacements   LOOP RECORDER IMPLANT     MASTECTOMY Right 2013   PORT-A-CATH REMOVAL N/A 12/07/2016  Procedure: REMOVAL PORT-A-CATH;  Surgeon: Rolm Bookbinder, MD;  Location: [facility];  Service: General;  Laterality: N/A;   PORTACATH PLACEMENT Right 08/11/2014   Procedure: INSERTION PORT-A-CATH WITH ULTRASOUND;  Surgeon: Rolm Bookbinder, MD;  Location: [facility];  Service: General;  Laterality: Right;   ROTATOR CUFF REPAIR  1999   right   tmi  1998   TONSILLECTOMY      Family History  Problem Relation Age of Onset   Colon cancer Mother    Cancer Mother        colon   Colon cancer Maternal Aunt    Cancer Maternal Uncle    Prostate cancer Maternal Uncle    Prostate cancer Maternal Uncle    Other Maternal Uncle    Cancer  Sister        kidney   Cancer Brother        lymph node   Anesthesia problems Neg Hx    Hypotension Neg Hx    Malignant hyperthermia Neg Hx    Pseudochol deficiency Neg Hx    Breast cancer Neg Hx     Social History   Socioeconomic History   Marital status: Widowed    Spouse name: Not on file   Number of children: Not on file   Years of education: Not on file   Highest education level: Not on file  Occupational History   Not on file  Tobacco Use   Smoking status: Former Smoker    Packs/day: 0.10    Years: 20.00    Pack years: 2.00    Types: Cigarettes    Quit date: 01/24/1978    Years since quitting: 41.7   Smokeless tobacco: Never Used  Vaping Use   Vaping Use: Never used  Substance and Sexual Activity   Alcohol use: No   Drug use: No   Sexual activity: Yes    Birth control/protection: Surgical  Other Topics Concern   Not on file  Social History Narrative   Not on file   Social Determinants of Health   Financial Resource Strain:    Difficulty of Paying Living Expenses: Not on file  Food Insecurity:    Worried About [place] in the Last Year: Not on file   [employer] of Food in the Last Year: Not on file  Transportation Needs:    Lack of Transportation (Medical): Not on file   Lack of Transportation (Non-Medical): Not on file  Physical Activity:    Days of Exercise per Week: Not on file   Minutes of Exercise per Session: Not on file  Stress:    Feeling of Stress : Not on file  Social Connections:    Frequency of Communication with Friends and Family: Not on file   Frequency of Social Gatherings with Friends and Family: Not on file   Attends Religious Services: Not on file   Active Member of Clubs or Organizations: Not on file   Attends Archivist Meetings: Not on file   Marital Status: Not on file  Intimate Partner Violence:    Fear of Current or Ex-Partner: Not on file   Emotionally Abused: Not on  file   Physically Abused: Not on file   Sexually Abused: Not on file    Outpatient Medications Prior to Visit  Medication Sig Dispense Refill   amLODipine (NORVASC) 5 MG tablet TAKE 1 TABLET BY MOUTH DAILY 90 tablet 2   apixaban (ELIQUIS) 5 MG TABS tablet Take 1 tablet (5 mg  total) by mouth 2 (two) times daily. 60 tablet    fexofenadine (ALLEGRA) 180 MG tablet Take 180 mg by mouth daily.     furosemide (LASIX) 20 MG tablet Take 1 tablet (20 mg total) by mouth daily. 90 tablet 2   meclizine (ANTIVERT) 25 MG tablet TAKE 1 TABLET(25 MG) BY MOUTH EVERY 6 HOURS AS NEEDED FOR DIZZINESS 100 tablet 2   metoprolol tartrate (LOPRESSOR) 25 MG tablet Take 1 tablet (25 mg total) by mouth 3 (three) times daily. May take 1 extra tablet as needed for heart rate over 100 315 tablet 1   montelukast (SINGULAIR) 10 MG tablet TAKE 1 TABLET(10 MG) BY MOUTH AT BEDTIME 90 tablet 0   Multiple Vitamins-Minerals (PRESERVISION AREDS 2 PO) Take 2 capsules by mouth daily.     NON FORMULARY CBD oil     pantoprazole (PROTONIX) 40 MG tablet TAKE 1 TABLET(40 MG) BY MOUTH TWICE DAILY 60 tablet 3   UNABLE TO FIND Med Name: CBD oil     albuterol (PROVENTIL) (2.5 MG/3ML) 0.083% nebulizer solution Take 3 mLs (2.5 mg total) by nebulization every 6 (six) hours as needed for wheezing or shortness of breath. (Patient not taking: Reported on 08/26/2019) 150 mL 1   albuterol (VENTOLIN HFA) 108 (90 Base) MCG/ACT inhaler INHALE 2 PUFFS INTO THE LUNGS EVERY 6 HOURS AS NEEDED FOR WHEEZING OR SHORTNESS OF BREATH (Patient not taking: Reported on 08/26/2019) 18 g 2   benzonatate (TESSALON PERLES) 100 MG capsule Take 1 capsule (100 mg total) by mouth 2 (two) times daily as needed. (Patient not taking: Reported on 08/26/2019) 20 capsule 0   fluticasone (FLONASE) 50 MCG/ACT nasal spray Place 2 sprays into both nostrils daily. (Patient not taking: Reported on 08/26/2019) 16 g 2   fluticasone furoate-vilanterol (BREO ELLIPTA) 100-25 MCG/INH  AEPB Inhale 1 puff into the lungs daily. Rinse mouth with water after use (Patient not taking: Reported on 08/26/2019) 60 each 2   Multiple Vitamins-Minerals (OCUVITE EXTRA) TABS Take 1 tablet by mouth in the morning and at bedtime. (Patient not taking: Reported on 10/22/2019)     ondansetron (ZOFRAN) 4 MG tablet Take 1 tablet (4 mg total) by mouth every 8 (eight) hours as needed for nausea or vomiting. (Patient not taking: Reported on 08/26/2019) 20 tablet 0   No facility-administered medications prior to visit.    Allergies  Allergen Reactions   Codeine Phosphate Other (See Comments)    Hallucinations   Adhesive [Tape]     Electrodes for EKG- skin blistering, erythema   Codeine Other (See Comments)    Hallucinations  Other reaction(s): Other    ROS Review of Systems  Constitutional: Negative for appetite change, chills, fever and unexpected weight change.  Respiratory: Negative for shortness of breath.   Cardiovascular: Negative for chest pain.  Gastrointestinal: Negative for abdominal pain.  Genitourinary: Negative for dysuria.  Musculoskeletal: Positive for back pain.  Neurological: Negative for weakness and numbness.  Hematological: Negative for adenopathy. Does not bruise/bleed easily.      Objective:    Physical Exam Vitals reviewed.  Constitutional:      Appearance: Normal appearance.  Cardiovascular:     Rate and Rhythm: Normal rate and regular rhythm.  Pulmonary:     Effort: Pulmonary effort is normal.     Breath sounds: Normal breath sounds.  Abdominal:     Palpations: Abdomen is soft.     Tenderness: There is no abdominal tenderness. There is no guarding or rebound.  Neurological:  Mental Status: She is alert.     Comments: Right leg raise are negative.  She has only trace reflex ankle bilaterally and difficult to elicit knee bilaterally.  No weakness with plantarflexion, dorsiflexion, knee extension     BP 128/78    Pulse 83    Temp 97.8 F (36.6 C)  (Oral)    Ht _0  (1.676 m)    Wt 200 lb 9.6 oz (91 kg)    SpO2 95%    BMI 32.38 kg/m  Wt Readings from Last 3 Encounters:  10/22/19 200 lb 9.6 oz (91 kg)  08/26/19 199 lb (90.3 kg)  08/20/19 200 lb (90.7 kg)     Health Maintenance Due  Topic Date Due   COVID-19 Vaccine (1) Never done   COLONOSCOPY  06/16/2015    There are no preventive care reminders to display for this patient.  Lab Results  Component Value Date   TSH 0.526 05/09/2019   Lab Results  Component Value Date   WBC 8.5 06/04/2019   HGB 13.9 06/04/2019   HCT 40.8 06/04/2019   MCV 97.5 06/04/2019   PLT 239.0 06/04/2019   Lab Results  Component Value Date   NA 142 05/08/2019   K 3.9 05/08/2019   CHLORIDE 108 12/06/2016   CO2 20 (L) 05/08/2019   GLUCOSE 93 05/08/2019   BUN 21 05/08/2019   CREATININE 0.66 05/08/2019   BILITOT 1.1 05/07/2019   ALKPHOS 53 05/07/2019   AST 18 05/07/2019   ALT 19 05/07/2019   PROT 5.3 (L) 05/07/2019   ALBUMIN 3.3 (L) 05/07/2019   CALCIUM 8.3 (L) 05/08/2019   ANIONGAP 9 05/08/2019   EGFR >60 12/06/2016   GFR 93.14 01/31/2018   Lab Results  Component Value Date   CHOL 169 08/28/2017   Lab Results  Component Value Date   HDL 43.20 08/28/2017   Lab Results  Component Value Date   LDLCALC 105 (H) 08/28/2017   Lab Results  Component Value Date   TRIG 103.0 08/28/2017   Lab Results  Component Value Date   CHOLHDL 4 08/28/2017   No results found for: HGBA1C    Assessment & Plan:   84 year old female who presents with three or 13-monthhistory of progressive bilateral back pain.  She does have history of both breast cancer and lymphoma.  She presented with similar type symptoms when her lymphoma was diagnosed 2016.  She has not had any red flags such as appetite or weight change  -Check labs with CBC with differential, comprehensive metabolic panel, sed rate -Recommend going ahead with MRI lumbar spine to further assess especially given her history and  progressive symptoms over several months. -Expressed concerns about getting into MRI scan secondary to anxiety symptoms.  We agreed to writing for lorazepam 0.5 mg take one about 1 hour prior to procedure.  She knows to have someone go with her to drive her that day.  Meds ordered this encounter  Medications   LORazepam (ATIVAN) 0.5 MG tablet    Sig: Take one tablet one hour prior to MRI scan    Dispense:  2 tablet    Refill:  0    Follow-up: No follow-ups on file.    BCarolann Littler MD

## 2019-10-23 LAB — CBC WITH DIFFERENTIAL/PLATELET
Absolute Monocytes: 1023 cells/uL — ABNORMAL HIGH (ref 200–950)
Basophils Absolute: 77 cells/uL (ref 0–200)
Basophils Relative: 0.9 %
Eosinophils Absolute: 318 cells/uL (ref 15–500)
Eosinophils Relative: 3.7 %
HCT: 45.4 % — ABNORMAL HIGH (ref 35.0–45.0)
Hemoglobin: 15.2 g/dL (ref 11.7–15.5)
Lymphs Abs: 1367 cells/uL (ref 850–3900)
MCH: 31.6 pg (ref 27.0–33.0)
MCHC: 33.5 g/dL (ref 32.0–36.0)
MCV: 94.4 fL (ref 80.0–100.0)
MPV: 10 fL (ref 7.5–12.5)
Monocytes Relative: 11.9 %
Neutro Abs: 5814 cells/uL (ref 1500–7800)
Neutrophils Relative %: 67.6 %
Platelets: 210 10*3/uL (ref 140–400)
RBC: 4.81 10*6/uL (ref 3.80–5.10)
RDW: 13.4 % (ref 11.0–15.0)
Total Lymphocyte: 15.9 %
WBC: 8.6 10*3/uL (ref 3.8–10.8)

## 2019-10-23 LAB — COMPREHENSIVE METABOLIC PANEL
AG Ratio: 2.4 (calc) (ref 1.0–2.5)
ALT: 16 U/L (ref 6–29)
AST: 20 U/L (ref 10–35)
Albumin: 4.3 g/dL (ref 3.6–5.1)
Alkaline phosphatase (APISO): 106 U/L (ref 37–153)
BUN: 12 mg/dL (ref 7–25)
CO2: 29 mmol/L (ref 20–32)
Calcium: 9.3 mg/dL (ref 8.6–10.4)
Chloride: 103 mmol/L (ref 98–110)
Creat: 0.8 mg/dL (ref 0.60–0.88)
Globulin: 1.8 g/dL (calc) — ABNORMAL LOW (ref 1.9–3.7)
Glucose, Bld: 99 mg/dL (ref 65–99)
Potassium: 3.6 mmol/L (ref 3.5–5.3)
Sodium: 144 mmol/L (ref 135–146)
Total Bilirubin: 0.8 mg/dL (ref 0.2–1.2)
Total Protein: 6.1 g/dL (ref 6.1–8.1)

## 2019-10-23 LAB — SEDIMENTATION RATE: Sed Rate: 14 mm/h (ref 0–30)

## 2019-10-24 ENCOUNTER — Telehealth: Payer: Self-pay | Admitting: Internal Medicine

## 2019-10-24 NOTE — Telephone Encounter (Signed)
Returning patients phone call. Patient has questions regarding MRI. Requesting lot # ad reference #. Advised patient to call [employer], I was unable to locate that information for her. Phone number provided.

## 2019-10-24 NOTE — Telephone Encounter (Signed)
Pt called and said that she had some questions about the loop that was inserted. Please call to discuss

## 2019-10-30 ENCOUNTER — Other Ambulatory Visit: Payer: Self-pay | Admitting: Family Medicine

## 2019-10-30 ENCOUNTER — Other Ambulatory Visit: Payer: Self-pay

## 2019-10-30 ENCOUNTER — Telehealth: Payer: Self-pay | Admitting: Family Medicine

## 2019-10-30 MED ORDER — APIXABAN 5 MG PO TABS
5.0000 mg | ORAL_TABLET | Freq: Two times a day (BID) | ORAL | 5 refills | Status: DC
Start: 2019-10-30 — End: 2020-04-27

## 2019-10-30 MED ORDER — HYDROCODONE-ACETAMINOPHEN 5-325 MG PO TABS
1.0000 | ORAL_TABLET | Freq: Three times a day (TID) | ORAL | 0 refills | Status: DC | PRN
Start: 2019-10-30 — End: 2020-02-19

## 2019-10-30 NOTE — Telephone Encounter (Signed)
I don't see what she is taking for pain? Doesn't look like Dr. Elease Hashimoto gave her anything. Happy to help, just need to know what she is referring to. Please check with [facility]. Looks like they have info they need in our system? Please see if any hold ups.

## 2019-10-30 NOTE — Telephone Encounter (Signed)
Ok I have sent in hydrocodone for her. She has an allergy listed to codeine - states hallucinations, but she has been prescribed hydrocodone multiple times in the past and I suspect this is what she had on hand from 2018. If she does not recall taking the hydrocodone or recalls having bad reaction to this, please let me know.

## 2019-10-30 NOTE — Telephone Encounter (Signed)
Spoke with the pt and she stated she has taken Hydrocodone previously with no reactions.  Patient is aware the Rx was sent to [facility].

## 2019-10-30 NOTE — Telephone Encounter (Signed)
Spoke with the pt and advised her to call [facility] at [phone] as there is a note in the referral stating they have tried to contact her as she has a loop recorder.  Patient stated she told Dr Elease Hashimoto she was taking a prescription for old pain medication she had at home.  Stated she does not recall the name and has thrown the bottle out and requested something for pain be sent to [facility] on [address] and [address] and was advised to check the pharmacy later or called back if there is a problem.  Message sent to PCP.

## 2019-10-30 NOTE — Telephone Encounter (Signed)
Spoke to pt to schedule AWV. Pt states that she is in pain and is almost out of pain medicine. She saw Dr. Elease Hashimoto 10/22/19.  Pt also states she is still waiting on [facility] to call her to set up the MRI and would like an update on this. Pt states there was an issue with scheduling and still hasn't heard back from them.  Please advise

## 2019-10-30 NOTE — Telephone Encounter (Signed)
Pt last saw Dr Angelena Form on 06/12/19, last labs 10/22/19 Creat 0.80, age 84, weight 91kg, based on specified criteria pt is on appropriate dosage of Eliquis 5mg  BID.  Will refill rx.

## 2019-10-31 ENCOUNTER — Telehealth: Payer: Self-pay | Admitting: Internal Medicine

## 2019-10-31 NOTE — Telephone Encounter (Signed)
Patient called back and updated. Verbalized understanding. Thankful for follow-up.

## 2019-10-31 NOTE — Telephone Encounter (Signed)
Patient calling to speak with Dr. Tanna Furry nurse. She has a question regarding her loop implant.

## 2019-10-31 NOTE — Telephone Encounter (Signed)
Patient returning call.

## 2019-10-31 NOTE — Telephone Encounter (Signed)
Returning patients phone call.  Patient needs an MRI. States she was wondering if her ILR was compatible with MRI. Advised patient it is compatible.  [facility] called, states patient will need to bring her card to the MRI with her. Attempted to call patient to advised. No answer, LMOVM.

## 2019-11-01 ENCOUNTER — Telehealth: Payer: Self-pay | Admitting: Family Medicine

## 2019-11-01 DIAGNOSIS — M5410 Radiculopathy, site unspecified: Secondary | ICD-10-CM

## 2019-11-01 NOTE — Telephone Encounter (Signed)
We can do a CT of lumbar spine if we are unable to do the MRI, but please check back with [facility]. I can only see a note that says "rich said can't have MRI". MRI would be our preferred study, and so I am curious about compatibility issue with the loop recorder? If there is question about ability to image, we could ask her cardiologist as well. It may be that she is able to complete and MRI at a different facility as the type of MRI machine also plays a roll.

## 2019-11-01 NOTE — Telephone Encounter (Signed)
Pt is calling in today stating that she was to be having a MRI at ([facility]) but they called her and stated that they are not able to do it due to the loop that she has in her chest and would like to have a call back to discuss what she should do.

## 2019-11-01 NOTE — Telephone Encounter (Signed)
Spoke with Crystal at [facility] and she stated a CT can be done for the pt.  CT scan was ordered and the pt was informed someone will call with appt info.

## 2019-11-05 ENCOUNTER — Other Ambulatory Visit: Payer: Self-pay

## 2019-11-05 ENCOUNTER — Ambulatory Visit (INDEPENDENT_AMBULATORY_CARE_PROVIDER_SITE_OTHER): Payer: Medicare Other

## 2019-11-05 DIAGNOSIS — Z0000 Encounter for general adult medical examination without abnormal findings: Secondary | ICD-10-CM

## 2019-11-05 NOTE — Patient Instructions (Signed)
Selena [name] , Thank you for taking time to come for your Medicare Wellness Visit. I appreciate your ongoing commitment to your health goals. Please review the following plan we discussed and let me know if I can assist you in the future.   Screening recommendations/referrals: Colonoscopy: no longer required  Mammogram: Done 10/16/19 Bone Density: Done 06/24/13 Recommended yearly ophthalmology/optometry visit for glaucoma screening and checkup Recommended yearly dental visit for hygiene and checkup  Vaccinations: Influenza vaccine: 10/01/19 Pneumococcal vaccine: Up to date Tdap vaccine: Up to date Shingles vaccine: Shingrix discussed. Please contact your pharmacy for coverage information.    Covid-19:Completed 02/04/19 & 03/04/19  Advanced directives: Copies in chart  Conditions/risks identified: Stay Healthy  Next appointment: Follow up in one year for your annual wellness visit    Preventive Care 65 Years and Older, Female Preventive care refers to lifestyle choices and visits with your health care provider that can promote health and wellness. What does preventive care include?  A yearly physical exam. This is also called an annual well check.  Dental exams once or twice a year.  Routine eye exams. Ask your health care provider how often you should have your eyes checked.  Personal lifestyle choices, including:  Daily care of your teeth and gums.  Regular physical activity.  Eating a healthy diet.  Avoiding tobacco and drug use.  Limiting alcohol use.  Practicing safe sex.  Taking low-dose aspirin every day.  Taking vitamin and mineral supplements as recommended by your health care provider. What happens during an annual well check? The services and screenings done by your health care provider during your annual well check will depend on your age, overall health, lifestyle risk factors, and family history of disease. Counseling  Your health care provider may ask you  questions about your:  Alcohol use.  Tobacco use.  Drug use.  Emotional well-being.  Home and relationship well-being.  Sexual activity.  Eating habits.  History of falls.  Memory and ability to understand (cognition).  Work and work Statistician.  Reproductive health. Screening  You may have the following tests or measurements:  Height, weight, and BMI.  Blood pressure.  Lipid and cholesterol levels. These may be checked every 5 years, or more frequently if you are over 23 years old.  Skin check.  Lung cancer screening. You may have this screening every year starting at age 35 if you have a 30-pack-year history of smoking and currently smoke or have quit within the past 15 years.  Fecal occult blood test (FOBT) of the stool. You may have this test every year starting at age 79.  Flexible sigmoidoscopy or colonoscopy. You may have a sigmoidoscopy every 5 years or a colonoscopy every 10 years starting at age 51.  Hepatitis C blood test.  Hepatitis B blood test.  Sexually transmitted disease (STD) testing.  Diabetes screening. This is done by checking your blood sugar (glucose) after you have not eaten for a while (fasting). You may have this done every 1-3 years.  Bone density scan. This is done to screen for osteoporosis. You may have this done starting at age 60.  Mammogram. This may be done every 1-2 years. Talk to your health care provider about how often you should have regular mammograms. Talk with your health care provider about your test results, treatment options, and if necessary, the need for more tests. Vaccines  Your health care provider may recommend certain vaccines, such as:  Influenza vaccine. This is recommended every year.  Tetanus, diphtheria, and acellular pertussis (Tdap, Td) vaccine. You may need a Td booster every 10 years.  Zoster vaccine. You may need this after age 31.  Pneumococcal 13-valent conjugate (PCV13) vaccine. One dose is  recommended after age 15.  Pneumococcal polysaccharide (PPSV23) vaccine. One dose is recommended after age 79. Talk to your health care provider about which screenings and vaccines you need and how often you need them. This information is not intended to replace advice given to you by your health care provider. Make sure you discuss any questions you have with your health care provider. Document Released: 02/06/2015 Document Revised: 09/30/2015 Document Reviewed: 11/11/2014 Elsevier Interactive Patient Education  2017 [place] Prevention in the Home Falls can cause injuries. They can happen to people of all ages. There are many things you can do to make your home safe and to help prevent falls. What can I do on the outside of my home?  Regularly fix the edges of walkways and driveways and fix any cracks.  Remove anything that might make you trip as you walk through a door, such as a raised step or threshold.  Trim any bushes or trees on the path to your home.  Use bright outdoor lighting.  Clear any walking paths of anything that might make someone trip, such as rocks or tools.  Regularly check to see if handrails are loose or broken. Make sure that both sides of any steps have handrails.  Any raised decks and porches should have guardrails on the edges.  Have any leaves, snow, or ice cleared regularly.  Use sand or salt on walking paths during winter.  Clean up any spills in your garage right away. This includes oil or grease spills. What can I do in the bathroom?  Use night lights.  Install grab bars by the toilet and in the tub and shower. Do not use towel bars as grab bars.  Use non-skid mats or decals in the tub or shower.  If you need to sit down in the shower, use a plastic, non-slip stool.  Keep the floor dry. Clean up any water that spills on the floor as soon as it happens.  Remove soap buildup in the tub or shower regularly.  Attach bath mats  securely with double-sided non-slip rug tape.  Do not have throw rugs and other things on the floor that can make you trip. What can I do in the bedroom?  Use night lights.  Make sure that you have a light by your bed that is easy to reach.  Do not use any sheets or blankets that are too big for your bed. They should not hang down onto the floor.  Have a firm chair that has side arms. You can use this for support while you get dressed.  Do not have throw rugs and other things on the floor that can make you trip. What can I do in the kitchen?  Clean up any spills right away.  Avoid walking on wet floors.  Keep items that you use a lot in easy-to-reach places.  If you need to reach something above you, use a strong step stool that has a grab bar.  Keep electrical cords out of the way.  Do not use floor polish or wax that makes floors slippery. If you must use wax, use non-skid floor wax.  Do not have throw rugs and other things on the floor that can make you trip. What can I do  with my stairs?  Do not leave any items on the stairs.  Make sure that there are handrails on both sides of the stairs and use them. Fix handrails that are broken or loose. Make sure that handrails are as long as the stairways.  Check any carpeting to make sure that it is firmly attached to the stairs. Fix any carpet that is loose or worn.  Avoid having throw rugs at the top or bottom of the stairs. If you do have throw rugs, attach them to the floor with carpet tape.  Make sure that you have a light switch at the top of the stairs and the bottom of the stairs. If you do not have them, ask someone to add them for you. What else can I do to help prevent falls?  Wear shoes that:  Do not have high heels.  Have rubber bottoms.  Are comfortable and fit you well.  Are closed at the toe. Do not wear sandals.  If you use a stepladder:  Make sure that it is fully opened. Do not climb a closed  stepladder.  Make sure that both sides of the stepladder are locked into place.  Ask someone to hold it for you, if possible.  Clearly mark and make sure that you can see:  Any grab bars or handrails.  First and last steps.  Where the edge of each step is.  Use tools that help you move around (mobility aids) if they are needed. These include:  Canes.  Walkers.  Scooters.  Crutches.  Turn on the lights when you go into a dark area. Replace any light bulbs as soon as they burn out.  Set up your furniture so you have a clear path. Avoid moving your furniture around.  If any of your floors are uneven, fix them.  If there are any pets around you, be aware of where they are.  Review your medicines with your doctor. Some medicines can make you feel dizzy. This can increase your chance of falling. Ask your doctor what other things that you can do to help prevent falls. This information is not intended to replace advice given to you by your health care provider. Make sure you discuss any questions you have with your health care provider. Document Released: 11/06/2008 Document Revised: 06/18/2015 Document Reviewed: 02/14/2014 Elsevier Interactive Patient Education  2017 [employer].

## 2019-11-05 NOTE — Progress Notes (Signed)
Virtual Visit via Telephone Note  I connected with  Selena [name] on 11/05/19 at  1:45 PM EDT by telephone and verified that I am speaking with the correct person using two identifiers.  Medicare Annual Wellness visit completed telephonically due to Covid-19 pandemic.   Persons participating in this call: This Health Coach and this patient.   Location: Patient: Home Provider: Office   I discussed the limitations, risks, security and privacy concerns of performing an evaluation and management service by telephone and the availability of in person appointments. The patient expressed understanding and agreed to proceed.  Unable to perform video visit due to video visit attempted and failed and/or patient does not have video capability.   Some vital signs may be absent or patient reported.   Willette Brace, LPN    Subjective:   Selena [name] is a 84 y.o. female who presents for Medicare Annual (Subsequent) preventive examination.  Review of Systems     Cardiac Risk Factors include: advanced age (>27men, >67 women);hypertension;sedentary lifestyle;obesity (BMI >30kg/m2)     Objective:    Today's Vitals   11/05/19 1404  PainSc: 5    There is no height or weight on file to calculate BMI.  Advanced Directives 11/05/2019 05/06/2019 04/22/2019 12/13/2017 12/07/2016 12/01/2016 10/11/2016  Does Patient Have a Medical Advance Directive? Yes Yes Yes Yes Yes No Yes  Type of Advance Directive Living will;Healthcare Power of [place];Living will - -  Does patient want to make changes to medical advance directive? - No - Patient declined No - Patient declined No - Patient declined No - Patient declined Yes (MAU/Ambulatory/Procedural Areas - Information given) -  Copy of [place] in Chart? Yes - validated most recent copy scanned in chart (See row information) No - copy  requested No - copy requested - Yes - -  Would patient like information on creating a medical advance directive? - No - Patient declined - - - - -  Pre-existing out of facility DNR order (yellow form or pink MOST form) - - - - - - -    Current Medications (verified) Outpatient Encounter Medications as of 11/05/2019  Medication Sig  . amLODipine (NORVASC) 5 MG tablet TAKE 1 TABLET BY MOUTH DAILY  . apixaban (ELIQUIS) 5 MG TABS tablet Take 1 tablet (5 mg total) by mouth 2 (two) times daily.  . furosemide (LASIX) 20 MG tablet Take 1 tablet (20 mg total) by mouth daily.  [name] HYDROcodone-acetaminophen (NORCO) 5-325 MG tablet Take 1 tablet by mouth 3 (three) times daily as needed for moderate pain or severe pain.  . meclizine (ANTIVERT) 25 MG tablet TAKE 1 TABLET(25 MG) BY MOUTH EVERY 6 HOURS AS NEEDED FOR DIZZINESS  . metoprolol tartrate (LOPRESSOR) 25 MG tablet Take 1 tablet (25 mg total) by mouth 3 (three) times daily. May take 1 extra tablet as needed for heart rate over 100  . montelukast (SINGULAIR) 10 MG tablet TAKE 1 TABLET(10 MG) BY MOUTH AT BEDTIME  . Multiple Vitamins-Minerals (PRESERVISION AREDS 2 PO) Take 2 capsules by mouth daily.  . NON FORMULARY CBD oil  . pantoprazole (PROTONIX) 40 MG tablet TAKE 1 TABLET(40 MG) BY MOUTH TWICE DAILY  . UNABLE TO FIND Med Name: CBD oil  . albuterol (PROVENTIL) (2.5 MG/3ML) 0.083% nebulizer solution Take 3 mLs (2.5 mg total) by nebulization every 6 (six) hours as needed for wheezing or shortness of breath. (  Patient not taking: Reported on 08/26/2019)  . LORazepam (ATIVAN) 0.5 MG tablet Take one tablet one hour prior to MRI scan  . [DISCONTINUED] albuterol (VENTOLIN HFA) 108 (90 Base) MCG/ACT inhaler INHALE 2 PUFFS INTO THE LUNGS EVERY 6 HOURS AS NEEDED FOR WHEEZING OR SHORTNESS OF BREATH (Patient not taking: Reported on 08/26/2019)  . [DISCONTINUED] benzonatate (TESSALON PERLES) 100 MG capsule Take 1 capsule (100 mg total) by mouth 2 (two) times daily as  needed. (Patient not taking: Reported on 08/26/2019)  . [DISCONTINUED] fexofenadine (ALLEGRA) 180 MG tablet Take 180 mg by mouth daily. (Patient not taking: Reported on 11/05/2019)  . [DISCONTINUED] fluticasone (FLONASE) 50 MCG/ACT nasal spray Place 2 sprays into both nostrils daily. (Patient not taking: Reported on 08/26/2019)  . [DISCONTINUED] fluticasone furoate-vilanterol (BREO ELLIPTA) 100-25 MCG/INH AEPB Inhale 1 puff into the lungs daily. Rinse mouth with water after use (Patient not taking: Reported on 08/26/2019)  . [DISCONTINUED] Multiple Vitamins-Minerals (OCUVITE EXTRA) TABS Take 1 tablet by mouth in the morning and at bedtime. (Patient not taking: Reported on 10/22/2019)  . [DISCONTINUED] ondansetron (ZOFRAN) 4 MG tablet Take 1 tablet (4 mg total) by mouth every 8 (eight) hours as needed for nausea or vomiting. (Patient not taking: Reported on 08/26/2019)   No facility-administered encounter medications on file as of 11/05/2019.    Allergies (verified) Codeine phosphate, Adhesive [tape], and Codeine   History: Past Medical History:  Diagnosis Date  . Anemia    PMH  . Aneurysm (arteriovenous) of coronary vessels   . Atrial fibrillation ([place]) 01/01/2007  . BACK PAIN, UPPER 07/09/2008  . Breast cancer, stage 1 ([place])   . Bruises easily   . Cataract    history  . Complication of anesthesia   . Diverticulitis   . DIVERTICULOSIS, COLON 12/26/2006  . Essential hypertension 10/27/2017  . Follicular non-Hodgkin's lymphoma ([place])   . Goiter    multi-nodular  . Hx of colonoscopy 2009  . LIVER FUNCTION TESTS, ABNORMAL, HX OF 12/26/2007  . Memory loss 05/16/2007  . OSTEOARTHRITIS 12/26/2006   takes Diclofenac daily but has stopped for surgery  . PONV (postoperative nausea and vomiting)   . Shortness of breath 05/28/2019  . Skin cancer   . VERTIGO 09/07/2009  . Wears dentures   . Wears glasses   . Wears hearing aid    bilateral   Past Surgical History:  Procedure Laterality Date  .  ABDOMINAL HYSTERECTOMY    . ABLATION OF DYSRHYTHMIC FOCUS  2009  . APPENDECTOMY    . AUGMENTATION MAMMAPLASTY Right 2013  . BIOPSY  05/07/2019   Procedure: BIOPSY;  Surgeon: Rush Landmark Telford Nab., MD;  Location: [facility];  Service: Gastroenterology;;  . BREAST BIOPSY  2013  . BREAST RECONSTRUCTION  04/14/2011   Procedure: BREAST RECONSTRUCTION;  Surgeon: Crissie Reese, MD;  Location: [facility];  Service: Plastics;  Laterality: Right;  Placement of Right Breast Tissue Expander with  use of Flex HD for Breast Reconstruction  . BREAST SURGERY     right sm, snbx  . CATARACT EXTRACTION W/ INTRAOCULAR LENS  IMPLANT, BILATERAL  2010   bilateral  . COLONOSCOPY    . COLONOSCOPY    . ESOPHAGOGASTRODUODENOSCOPY (EGD) WITH PROPOFOL N/A 05/07/2019   Procedure: ESOPHAGOGASTRODUODENOSCOPY (EGD) WITH PROPOFOL;  Surgeon: Rush Landmark Telford Nab., MD;  Location: [facility];  Service: Gastroenterology;  Laterality: N/A;  . KNEE SURGERY  2004/2010   arthroscopic/ bilateral knee replacements  . LOOP RECORDER IMPLANT    . MASTECTOMY Right 2013  .  PORT-A-CATH REMOVAL N/A 12/07/2016   Procedure: REMOVAL PORT-A-CATH;  Surgeon: Rolm Bookbinder, MD;  Location: [facility];  Service: General;  Laterality: N/A;  . PORTACATH PLACEMENT Right 08/11/2014   Procedure: INSERTION PORT-A-CATH WITH ULTRASOUND;  Surgeon: Rolm Bookbinder, MD;  Location: [facility];  Service: General;  Laterality: Right;  . [place]   right  . tmi  1998  . TONSILLECTOMY     Family History  Problem Relation Age of Onset  . Colon cancer Mother   . Cancer Mother        colon  . Colon cancer Maternal Aunt   . Cancer Maternal Uncle   . Prostate cancer Maternal Uncle   . Prostate cancer Maternal Uncle   . Other Maternal Uncle   . Cancer Sister        kidney  . Cancer Brother        lymph node  . Anesthesia problems Neg Hx   . Hypotension Neg Hx   . Malignant hyperthermia Neg Hx   . Pseudochol deficiency Neg Hx   . Breast  cancer Neg Hx    Social History   Socioeconomic History  . Marital status: Widowed    Spouse name: Not on file  . Number of children: Not on file  . Years of education: Not on file  . Highest education level: Not on file  Occupational History  . Occupation: retired  Tobacco Use  . Smoking status: Former Smoker    Packs/day: 0.10    Years: 20.00    Pack years: 2.00    Types: Cigarettes    Quit date: 01/24/1978    Years since quitting: 41.8  . Smokeless tobacco: Never Used  Vaping Use  . Vaping Use: Never used  Substance and Sexual Activity  . Alcohol use: No  . Drug use: No  . Sexual activity: Yes    Birth control/protection: Surgical  Other Topics Concern  . Not on file  Social History Narrative  . Not on file   Social Determinants of Health   Financial Resource Strain: Low Risk   . Difficulty of Paying Living Expenses: Not hard at all  Food Insecurity: No Food Insecurity  . Worried About Charity fundraiser in the Last Year: Never true  . Ran Out of Food in the Last Year: Never true  Transportation Needs: No Transportation Needs  . Lack of Transportation (Medical): No  . Lack of Transportation (Non-Medical): No  Physical Activity: Insufficiently Active  . Days of Exercise per Week: 7 days  . Minutes of Exercise per Session: 10 min  Stress: No Stress Concern Present  . Feeling of Stress : Not at all  Social Connections: Moderately Isolated  . Frequency of Communication with Friends and Family: More than three times a week  . Frequency of Social Gatherings with Friends and Family: Never  . Attends Religious Services: More than 4 times per year  . Active Member of Clubs or Organizations: No  . Attends Archivist Meetings: Never  . Marital Status: Widowed    Tobacco Counseling Counseling given: Not Answered   Clinical Intake:  Pre-visit preparation completed: Yes  Pain : 0-10 (back) Pain Score: 5  Pain Type: Chronic pain Pain Location:  Back Pain Orientation: Lower Pain Descriptors / Indicators: Aching Pain Onset: More than a month ago Pain Frequency: Intermittent     BMI - recorded: 32.39 Nutritional Status: BMI > 30  Obese Nutritional Risks: None Diabetes: No  How often do you need to have someone help you when you read instructions, pamphlets, or other written materials from your doctor or pharmacy?: 1 - Never  Diabetic?No  Interpreter Needed?: No  Information entered by :: Charlott Rakes, LPN   Activities of Daily Living In your present state of health, do you have any difficulty performing the following activities: 11/05/2019 05/06/2019  Hearing? Y -  Comment wears hearing aids -  Vision? N -  Difficulty concentrating or making decisions? N -  Walking or climbing stairs? N -  Comment - -  Dressing or bathing? N -  Doing errands, shopping? N Y  Conservation officer, nature and eating ? N -  Using the Toilet? N -  In the past six months, have you accidently leaked urine? N -  Do you have problems with loss of bowel control? N -  Managing your Medications? N -  Managing your Finances? N -  Housekeeping or managing your Housekeeping? N -  Some recent data might be hidden    Patient Care Team: Caren Macadam, MD as PCP - General (Family Medicine) Burnell Blanks, MD as PCP - Cardiology (Cardiology)  Indicate any recent Medical Services you may have received from other than [facility] providers in the past year (date may be approximate).     Assessment:   This is a routine wellness examination for Zayley.  Hearing/Vision screen  Hearing Screening   125Hz  250Hz  500Hz  1000Hz  2000Hz  3000Hz  4000Hz  6000Hz  8000Hz   Right ear:           Left ear:           Comments: Pt wears hearing aids   Vision Screening Comments: Follows up annually for eye care   Dietary issues and exercise activities discussed: Current Exercise Habits: Home exercise routine (pt states she walks to drive way and back get SOB with alot  of walking), Type of exercise: walking, Time (Minutes): 10, Frequency (Times/Week): 7, Weekly Exercise (Minutes/Week): 70, Exercise limited by: respiratory conditions(s);orthopedic condition(s);Other - see comments (low back pain)  Goals    . Patient Stated     Get better and rid of back pain      Depression Screen PHQ 2/9 Scores 11/05/2019 05/28/2019 08/28/2017 08/23/2016 01/06/2015 08/26/2013 08/17/2012  PHQ - 2 Score 0 0 0 0 0 0 0    Fall Risk Fall Risk  11/05/2019 05/28/2019 08/28/2017 08/23/2016 01/06/2015  Falls in the past year? 0 1 Yes No No  Number falls in past yr: 0 1 1 - -  Injury with Fall? 0 1 No - -  Risk for fall due to : Impaired mobility;Impaired balance/gait;Impaired vision History of fall(s) - - -  Follow up Falls prevention discussed - - - -    Any stairs in or around the home? No  If so, are there any without handrails? No  Home free of loose throw rugs in walkways, pet beds, electrical [name], etc? Yes  Adequate lighting in your home to reduce risk of falls? Yes   ASSISTIVE DEVICES UTILIZED TO PREVENT FALLS:  Life alert? Yes  Use of a cane, walker or w/c? Yes  Grab bars in the bathroom? Yes  Shower chair or bench in shower? Yes  Elevated toilet seat or a handicapped toilet? Yes   TIMED UP AND GO:  Was the test performed? No .      Cognitive Function:     6CIT Screen 11/05/2019  What Year? 0 points  What month? 0 points  Count back from 20 0 points  Months in reverse 4 points  Repeat phrase 4 points    Immunizations Immunization History  Administered Date(s) Administered  . Influenza Split 10/20/2011  . Influenza Whole 11/07/2006  . Influenza, High Dose Seasonal PF 10/13/2016, 10/12/2017  . Influenza-Unspecified 09/07/2013, 11/13/2014, 10/15/2015, 09/26/2018  . Pneumococcal Conjugate-13 08/26/2013  . Pneumococcal Polysaccharide-23 10/29/2007, 08/28/2017  . Td 05/27/2008  . Tdap 05/30/2018  . Zoster 10/29/2007    TDAP status: Up to date Flu  Vaccine status: Up to date pt stated done at [facility] 09/2019 Pneumococcal vaccine status: Up to date Covid-19 vaccine status: Completed vaccines  Qualifies for Shingles Vaccine? Yes   Zostavax completed Yes     Shingrix Completed?: No.    Education has been provided regarding the importance of this vaccine. Patient has been advised to call insurance company to determine out of pocket expense if they have not yet received this vaccine. Advised may also receive vaccine at local pharmacy or Health Dept. Verbalized acceptance and understanding.  Screening Tests Health Maintenance  Topic Date Due  . COVID-19 Vaccine (1) Never done  . COLONOSCOPY  06/16/2015  . TETANUS/TDAP  05/29/2028  . DEXA SCAN  Completed  . PNA vac Low Risk Adult  Completed    Health Maintenance  Health Maintenance Due  Topic Date Due  . COVID-19 Vaccine (1) Never done  . COLONOSCOPY  06/16/2015    Colorectal cancer screening: No longer required.  Mammogram status: Completed 10/16/19. Repeat every year Bone Density status: Completed 06/24/13. Results reflect: Bone density results: NORMAL. Repeat every 2 years.   Additional Screening:    Vision Screening: Recommended annual ophthalmology exams for early detection of glaucoma and other disorders of the eye. Is the patient up to date with their annual eye exam?  Yes  Who is the provider or what is the name of the office in which the patient attends annual eye exams? Dr Tally Due [facility] Dental Screening: Recommended annual dental exams for proper oral hygiene  Community Resource Referral / Chronic Care Management: CRR required this visit?  No   CCM required this visit?  No      Plan:     I have personally reviewed and noted the following in the patient's chart:   . Medical and social history . Use of alcohol, tobacco or illicit drugs  . Current medications and supplements . Functional ability and status . Nutritional status . Physical  activity . Advanced directives . List of other physicians . Hospitalizations, surgeries, and ER visits in previous 12 months . Vitals . Screenings to include cognitive, depression, and falls . Referrals and appointments  In addition, I have reviewed and discussed with patient certain preventive protocols, quality metrics, and best practice recommendations. A written personalized care plan for preventive services as well as general preventive health recommendations were provided to patient.     Willette Brace, LPN   [id number]   Nurse Notes: None

## 2019-11-07 ENCOUNTER — Other Ambulatory Visit: Payer: Self-pay | Admitting: Cardiovascular Disease

## 2019-11-07 DIAGNOSIS — I10 Essential (primary) hypertension: Secondary | ICD-10-CM

## 2019-11-11 ENCOUNTER — Ambulatory Visit (INDEPENDENT_AMBULATORY_CARE_PROVIDER_SITE_OTHER): Payer: Medicare Other

## 2019-11-11 DIAGNOSIS — R55 Syncope and collapse: Secondary | ICD-10-CM

## 2019-11-15 NOTE — Progress Notes (Signed)
Carelink Summary Report / Loop Recorder 

## 2019-11-18 ENCOUNTER — Other Ambulatory Visit: Payer: Self-pay

## 2019-11-18 ENCOUNTER — Ambulatory Visit
Admission: RE | Admit: 2019-11-18 | Discharge: 2019-11-18 | Disposition: A | Discharge status: Home / Self Care | Payer: Medicare Other | Source: Ambulatory Visit | Attending: Family Medicine | Admitting: Family Medicine

## 2019-11-18 DIAGNOSIS — M5410 Radiculopathy, site unspecified: Secondary | ICD-10-CM

## 2019-11-19 ENCOUNTER — Other Ambulatory Visit: Payer: Self-pay | Admitting: Cardiovascular Disease

## 2019-11-19 DIAGNOSIS — I10 Essential (primary) hypertension: Secondary | ICD-10-CM

## 2019-11-23 ENCOUNTER — Other Ambulatory Visit: Payer: Medicare Other

## 2019-11-29 ENCOUNTER — Other Ambulatory Visit: Payer: Self-pay

## 2019-11-29 ENCOUNTER — Encounter (INDEPENDENT_AMBULATORY_CARE_PROVIDER_SITE_OTHER): Payer: Self-pay

## 2019-11-29 ENCOUNTER — Encounter: Payer: Self-pay | Admitting: Internal Medicine

## 2019-11-29 ENCOUNTER — Other Ambulatory Visit: Payer: Self-pay | Admitting: Family Medicine

## 2019-11-29 ENCOUNTER — Ambulatory Visit: Payer: Medicare Other | Admitting: Internal Medicine

## 2019-11-29 VITALS — BP 108/74 | HR 81 | Ht 66.0 in | Wt 200.2 lb

## 2019-11-29 DIAGNOSIS — R55 Syncope and collapse: Secondary | ICD-10-CM | POA: Diagnosis not present

## 2019-11-29 DIAGNOSIS — I480 Paroxysmal atrial fibrillation: Secondary | ICD-10-CM

## 2019-11-29 NOTE — Patient Instructions (Signed)
Medication Instructions:  °Your physician recommends that you continue on your current medications as directed. Please refer to the Current Medication list given to you today. ° °Labwork: °None ordered. ° °Testing/Procedures: °None ordered. ° °Follow-Up: °Your physician wants you to follow-up in: one year with Dr. Taylor.   You will receive a reminder letter in the mail two months in advance. If you don't receive a letter, please call our office to schedule the follow-up appointment. ° °Remote monthly monitoring is used to monitor your loop recorder. ° °Any Other Special Instructions Will Be Listed Below (If Applicable). ° °If you need a refill on your cardiac medications before your next appointment, please call your pharmacy.  ° °

## 2019-11-29 NOTE — Progress Notes (Signed)
HPI Mrs. Selena [name] returns today for followup. She is a pleasant 84 yo woman with syncope who was found to have persistent atrial fib with a RVR/CVR/SVR. She has not had syncope since her device was placed. She feels well except for arthritis.  She denies chest pain or sob.   Allergies  Allergen Reactions  . Codeine Phosphate Other (See Comments)    Hallucinations  . Adhesive [Tape]     Electrodes for EKG- skin blistering, erythema  . Codeine Other (See Comments)    Hallucinations  Other reaction(s): Other     Current Outpatient Medications  Medication Sig Dispense Refill  . albuterol (PROVENTIL) (2.5 MG/3ML) 0.083% nebulizer solution Take 3 mLs (2.5 mg total) by nebulization every 6 (six) hours as needed for wheezing or shortness of breath. 150 mL 1  . amLODipine (NORVASC) 5 MG tablet TAKE 1 TABLET BY MOUTH DAILY 90 tablet 2  . apixaban (ELIQUIS) 5 MG TABS tablet Take 1 tablet (5 mg total) by mouth 2 (two) times daily. 60 tablet 5  . furosemide (LASIX) 20 MG tablet Take 1 tablet (20 mg total) by mouth daily. 90 tablet 2  . HYDROcodone-acetaminophen (NORCO) 5-325 MG tablet Take 1 tablet by mouth 3 (three) times daily as needed for moderate pain or severe pain. 30 tablet 0  . LORazepam (ATIVAN) 0.5 MG tablet Take one tablet one hour prior to MRI scan 2 tablet 0  . meclizine (ANTIVERT) 25 MG tablet TAKE 1 TABLET(25 MG) BY MOUTH EVERY 6 HOURS AS NEEDED FOR DIZZINESS 100 tablet 2  . metoprolol tartrate (LOPRESSOR) 25 MG tablet Take 1 tablet (25 mg total) by mouth 3 (three) times daily. May take 1 extra tablet as needed for heart rate over 100 315 tablet 1  . montelukast (SINGULAIR) 10 MG tablet TAKE 1 TABLET(10 MG) BY MOUTH AT BEDTIME 90 tablet 0  . Multiple Vitamins-Minerals (PRESERVISION AREDS 2 PO) Take 2 capsules by mouth daily.    . NON FORMULARY CBD oil    . pantoprazole (PROTONIX) 40 MG tablet TAKE 1 TABLET(40 MG) BY MOUTH TWICE DAILY 60 tablet 3  . UNABLE TO FIND Med Name:  CBD oil     No current facility-administered medications for this visit.     Past Medical History:  Diagnosis Date  . Anemia    PMH  . Aneurysm (arteriovenous) of coronary vessels   . Atrial fibrillation ([place]) 01/01/2007  . BACK PAIN, UPPER 07/09/2008  . Breast cancer, stage 1 ([place])   . Bruises easily   . Cataract    history  . Complication of anesthesia   . Diverticulitis   . DIVERTICULOSIS, COLON 12/26/2006  . Essential hypertension 10/27/2017  . Follicular non-Hodgkin's lymphoma ([place])   . Goiter    multi-nodular  . Hx of colonoscopy 2009  . LIVER FUNCTION TESTS, ABNORMAL, HX OF 12/26/2007  . Memory loss 05/16/2007  . OSTEOARTHRITIS 12/26/2006   takes Diclofenac daily but has stopped for surgery  . PONV (postoperative nausea and vomiting)   . Shortness of breath 05/28/2019  . Skin cancer   . VERTIGO 09/07/2009  . Wears dentures   . Wears glasses   . Wears hearing aid    bilateral    ROS:   All systems reviewed and negative except as noted in the HPI.   Past Surgical History:  Procedure Laterality Date  . ABDOMINAL HYSTERECTOMY    . ABLATION OF DYSRHYTHMIC FOCUS  2009  . APPENDECTOMY    .  AUGMENTATION MAMMAPLASTY Right 2013  . BIOPSY  05/07/2019   Procedure: BIOPSY;  Surgeon: Rush Landmark Telford Nab., MD;  Location: [facility];  Service: Gastroenterology;;  . BREAST BIOPSY  2013  . BREAST RECONSTRUCTION  04/14/2011   Procedure: BREAST RECONSTRUCTION;  Surgeon: Crissie Reese, MD;  Location: [place];  Service: Plastics;  Laterality: Right;  Placement of Right Breast Tissue Expander with  use of Flex HD for Breast Reconstruction  . BREAST SURGERY     right sm, snbx  . CATARACT EXTRACTION W/ INTRAOCULAR LENS  IMPLANT, BILATERAL  2010   bilateral  . COLONOSCOPY    . COLONOSCOPY    . ESOPHAGOGASTRODUODENOSCOPY (EGD) WITH PROPOFOL N/A 05/07/2019   Procedure: ESOPHAGOGASTRODUODENOSCOPY (EGD) WITH PROPOFOL;  Surgeon: Rush Landmark Telford Nab., MD;  Location: [facility];   Service: Gastroenterology;  Laterality: N/A;  . KNEE SURGERY  2004/2010   arthroscopic/ bilateral knee replacements  . LOOP RECORDER IMPLANT    . MASTECTOMY Right 2013  . PORT-A-CATH REMOVAL N/A 12/07/2016   Procedure: REMOVAL PORT-A-CATH;  Surgeon: Rolm Bookbinder, MD;  Location: [place];  Service: General;  Laterality: N/A;  . PORTACATH PLACEMENT Right 08/11/2014   Procedure: INSERTION PORT-A-CATH WITH ULTRASOUND;  Surgeon: Rolm Bookbinder, MD;  Location: [place];  Service: General;  Laterality: Right;  . [place]   right  . tmi  1998  . TONSILLECTOMY       Family History  Problem Relation Age of Onset  . Colon cancer Mother   . Cancer Mother        colon  . Colon cancer Maternal Aunt   . Cancer Maternal Uncle   . Prostate cancer Maternal Uncle   . Prostate cancer Maternal Uncle   . Other Maternal Uncle   . Cancer Sister        kidney  . Cancer Brother        lymph node  . Anesthesia problems Neg Hx   . Hypotension Neg Hx   . Malignant hyperthermia Neg Hx   . Pseudochol deficiency Neg Hx   . Breast cancer Neg Hx      Social History   Socioeconomic History  . Marital status: Widowed    Spouse name: Not on file  . Number of children: Not on file  . Years of education: Not on file  . Highest education level: Not on file  Occupational History  . Occupation: retired  Tobacco Use  . Smoking status: Former Smoker    Packs/day: 0.10    Years: 20.00    Pack years: 2.00    Types: Cigarettes    Quit date: 01/24/1978    Years since quitting: 41.8  . Smokeless tobacco: Never Used  Vaping Use  . Vaping Use: Never used  Substance and Sexual Activity  . Alcohol use: No  . Drug use: No  . Sexual activity: Yes    Birth control/protection: Surgical  Other Topics Concern  . Not on file  Social History Narrative  . Not on file   Social Determinants of Health   Financial Resource Strain: Low Risk   . Difficulty of Paying Living Expenses: Not hard at  all  Food Insecurity: No Food Insecurity  . Worried About Charity fundraiser in the Last Year: Never true  . Ran Out of Food in the Last Year: Never true  Transportation Needs: No Transportation Needs  . Lack of Transportation (Medical): No  . Lack of Transportation (Non-Medical): No  Physical Activity: Insufficiently  Active  . Days of Exercise per Week: 7 days  . Minutes of Exercise per Session: 10 min  Stress: No Stress Concern Present  . Feeling of Stress : Not at all  Social Connections: Moderately Isolated  . Frequency of Communication with Friends and Family: More than three times a week  . Frequency of Social Gatherings with Friends and Family: Never  . Attends Religious Services: More than 4 times per year  . Active Member of Clubs or Organizations: No  . Attends Archivist Meetings: Never  . Marital Status: Widowed  Intimate Partner Violence: Not At Risk  . Fear of Current or Ex-Partner: No  . Emotionally Abused: No  . Physically Abused: No  . Sexually Abused: No     BP 108/74   Pulse 81   Ht 5\' 6"  (1.676 m)   Wt 200 lb 3.2 oz (90.8 kg)   SpO2 97%   BMI 32.31 kg/m   Physical Exam:  Well appearing NAD HEENT: Unremarkable Neck:  No JVD, no thyromegally Lymphatics:  No adenopathy Back:  No CVA tenderness Lungs:  Clear with no wheezes HEART:  Regular rate rhythm, no murmurs, no rubs, no clicks Abd:  soft, positive bowel sounds, no organomegally, no rebound, no guarding Ext:  2 plus pulses, no edema, no cyanosis, no clubbing Skin:  No rashes no nodules Neuro:  CN II through XII intact, motor grossly intact  EKG - atrial fib with a controlled VR  DEVICE  Normal device function.  See PaceArt for details.   Assess/Plan: 1. Atrial fib - her rates are neither too fast or too slow. She will continue her current meds. 2. Coags - she has not had any bleeding. We will follow. 3. HTN -her bp is well controlled. 4. Syncope - she has not had any additional  episodes of syncope.   Carleene Overlie Calyssa Zobrist,MD

## 2019-12-02 ENCOUNTER — Other Ambulatory Visit: Payer: Self-pay

## 2019-12-02 ENCOUNTER — Ambulatory Visit: Payer: Medicare Other | Admitting: Family Medicine

## 2019-12-02 ENCOUNTER — Encounter: Payer: Self-pay | Admitting: Family Medicine

## 2019-12-02 VITALS — BP 110/74 | HR 92 | Temp 97.9°F | Wt 199.0 lb

## 2019-12-02 DIAGNOSIS — R42 Dizziness and giddiness: Secondary | ICD-10-CM

## 2019-12-02 MED ORDER — ONDANSETRON 4 MG PO TBDP: 4.0000 mg | ORAL_TABLET | Freq: Three times a day (TID) | ORAL | 0 refills | Status: DC | PRN

## 2019-12-02 NOTE — Patient Instructions (Signed)

## 2019-12-02 NOTE — Progress Notes (Signed)
Subjective:    Patient ID: Selena [name], female    DOB: [birth year], 84 y.o.   MRN: [id number]  No chief complaint on file.   HPI Patient is an 84 year old female with past medical history significant for HTN, A. Fib s/p ablation, chronic diastolic CHF, thoracic aortic aneurysm, history of follicular lymphoma grade 1, OA, GERD, vertigo anxiety, right breast cancer followed by Dr. Ethlyn Gallery who was seen today for worsening vertigo over the last month.  Pt notes h/o vertigo x yrs.  Can occur with looking in a certain direction.  Feels like the room is spinning.  Yrs ago did PT for it which helped.  Pt's granddaughter has been helping her with epley's maneuver, but states she needs to see someone as it is increasing in frequency.  In the past pt took Prochlorperazine and antivert.  Past Medical History:  Diagnosis Date  . Anemia    PMH  . Aneurysm (arteriovenous) of coronary vessels   . Atrial fibrillation ([place]) 01/01/2007  . BACK PAIN, UPPER 07/09/2008  . Breast cancer, stage 1 ([place])   . Bruises easily   . Cataract    history  . Complication of anesthesia   . Diverticulitis   . DIVERTICULOSIS, COLON 12/26/2006  . Essential hypertension 10/27/2017  . Follicular non-Hodgkin's lymphoma ([place])   . Goiter    multi-nodular  . Hx of colonoscopy 2009  . LIVER FUNCTION TESTS, ABNORMAL, HX OF 12/26/2007  . Memory loss 05/16/2007  . OSTEOARTHRITIS 12/26/2006   takes Diclofenac daily but has stopped for surgery  . PONV (postoperative nausea and vomiting)   . Shortness of breath 05/28/2019  . Skin cancer   . VERTIGO 09/07/2009  . Wears dentures   . Wears glasses   . Wears hearing aid    bilateral    Allergies  Allergen Reactions  . Codeine Phosphate Other (See Comments)    Hallucinations  . Adhesive [Tape]     Electrodes for EKG- skin blistering, erythema  . Codeine Other (See Comments)    Hallucinations  Other reaction(s): Other    ROS General: Denies fever, chills, night sweats,  changes in weight, changes in appetite  +vertigo HEENT: Denies headaches, ear pain, changes in vision, rhinorrhea, sore throat CV: Denies CP, palpitations, SOB, orthopnea Pulm: Denies SOB, cough, wheezing GI: Denies abdominal pain, nausea, vomiting, diarrhea, constipation GU: Denies dysuria, hematuria, frequency, vaginal discharge Msk: Denies muscle cramps, joint pains Neuro: Denies weakness, numbness, tingling Skin: Denies rashes, bruising Psych: Denies depression, anxiety, hallucinations    Objective:    Blood pressure 110/74, pulse 92, temperature 97.9 F (36.6 C), temperature source Oral, weight 199 lb (90.3 kg), SpO2 97 %.  Gen. Pleasant, well-nourished, in no distress, normal affect   HEENT: [employer]/AT, face symmetric, conjunctiva clear, no scleral icterus, PERRLA, EOMI, nares patent without drainage Lungs: no accessory muscle use Cardiovascular: RRR,  no peripheral edema Musculoskeletal: No deformities, no cyanosis or clubbing, normal tone Neuro:  A&Ox3, CN II-XII intact, normal gait Skin:  Warm, no lesions/ rash   Wt Readings from Last 3 Encounters:  11/29/19 200 lb 3.2 oz (90.8 kg)  10/22/19 200 lb 9.6 oz (91 kg)  08/26/19 199 lb (90.3 kg)    Lab Results  Component Value Date   WBC 8.6 10/22/2019   HGB 15.2 10/22/2019   HCT 45.4 (H) 10/22/2019   PLT 210 10/22/2019   GLUCOSE 99 10/22/2019   CHOL 169 08/28/2017   TRIG 103.0 08/28/2017   HDL 43.20 08/28/2017  LDLDIRECT 133.0 08/23/2016   LDLCALC 105 (H) 08/28/2017   ALT 16 10/22/2019   AST 20 10/22/2019   NA 144 10/22/2019   K 3.6 10/22/2019   CL 103 10/22/2019   CREATININE 0.80 10/22/2019   BUN 12 10/22/2019   CO2 29 10/22/2019   TSH 0.526 05/09/2019   INR 1.1 05/06/2019    Assessment/Plan:  Vertigo  -discussed vestibular rehab.  Referral placed. - Plan: PT vestibular rehab, ondansetron (ZOFRAN ODT) 4 MG disintegrating tablet     -Another referral to PT placed 12/09/19 as the initial referral does not  appear in the system.  F/u prn with pcp  Grier Mitts, MD

## 2019-12-02 NOTE — Telephone Encounter (Signed)
Pt is calling in stating that she is almost out of Rx montelukast (SINGULAIR) 10 MG   Pharm:   [facility] on [address].

## 2019-12-09 ENCOUNTER — Telehealth: Payer: Self-pay | Admitting: Family Medicine

## 2019-12-09 NOTE — Telephone Encounter (Signed)
Ok for referral?

## 2019-12-09 NOTE — Telephone Encounter (Signed)
Patient needs a referral to [facility] for Vertigo.  Please fax to: [phone]

## 2019-12-11 NOTE — Telephone Encounter (Signed)
ok 

## 2019-12-11 NOTE — Telephone Encounter (Signed)
Patient wants to be referred to [facility] and not [facility].  I have advised Neoma Laming and she said that she will send it to [facility]

## 2019-12-12 ENCOUNTER — Ambulatory Visit (INDEPENDENT_AMBULATORY_CARE_PROVIDER_SITE_OTHER): Payer: Medicare Other

## 2019-12-12 DIAGNOSIS — R55 Syncope and collapse: Secondary | ICD-10-CM | POA: Diagnosis not present

## 2019-12-12 LAB — CUP PACEART REMOTE DEVICE CHECK
Date Time Interrogation Session: 20211118063712
Implantable Pulse Generator Implant Date: 20200827

## 2019-12-12 NOTE — Progress Notes (Signed)
Patient Care Team: Caren Macadam, MD as PCP - General (Family Medicine) Burnell Blanks, MD as PCP - Cardiology (Cardiology)  DIAGNOSIS:    ICD-10-CM   1. Follicular 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 ([name])  C82.03   2. Malignant neoplasm of upper-outer quadrant of right breast in female, estrogen receptor positive ([place])  C50.411    Z17.0     SUMMARY OF ONCOLOGIC HISTORY: Oncology History  Breast cancer of upper-outer quadrant of right female breast ([place])  04/14/2011 Surgery   Right mastectomy: 2 foci of invasive ductal carcinoma 0.2 cm, grade 1 with background extensive DCIS, 0/5 lymph nodes negative,focus 1: ER 94%, PR 100%, Ki-67 5%, HER-2 negative: Focus to ER 100%BR 6% Ki-67 29% HER-2 negative   05/02/2011 -  Anti-estrogen oral therapy   letrozole 2.5 mg daily   Follicular 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 ([name])  07/24/2014 Initial Diagnosis   Retroperitoneal mass biopsy left side: Follicular lymphoma low-grade grade 1-2, flow cytometry positive for CD10, 20, 3, 10, 5, BCL-2, BCl-6, CD21, Ki-67 less than 10%   08/13/2014 - 12/06/2016 Chemotherapy   Bendamustine and Rituxan day 1, 2 every 4 weeks 6 cycles followed by Rituxan maintenance every 2 months 2 years completed 12/06/2016   11/03/2014 Imaging   midpoint of chemotherapy: Moderate improvement in the periaortic lymphadenopathy without complete resolution   01/16/2015 PET scan    interval decrease in size and metabolic activity of the large left retroperitoneal mass , no increase in activity, size 10 mm decreased from 48 mm  SUV 1.9 , no additional hypermetabolic activity     CHIEF COMPLIANT: Surveillance of breast cancer and lymphoma  INTERVAL HISTORY: Selena [name] is a 84 y.o. with above-mentioned history of breast cancer and follicular lymphoma. Mammogram on 10/16/19 showed no evidence of malignancy in the left breast. She presents to the clinic today for follow-up.  She  had an episode of GI bleed which was related to one of the pain medication that she was taking.  She stopped it and bleeding has subsided.  She is doing quite well generally.  Denies any fevers chills night sweats or weight loss.  Denies any lymphadenopathy.  Denies any pain or lumps or nodules in the breast.  ALLERGIES:  is allergic to codeine phosphate, adhesive [tape], and codeine.  MEDICATIONS:  Current Outpatient Medications  Medication Sig Dispense Refill  . albuterol (PROVENTIL) (2.5 MG/3ML) 0.083% nebulizer solution Take 3 mLs (2.5 mg total) by nebulization every 6 (six) hours as needed for wheezing or shortness of breath. 150 mL 1  . amLODipine (NORVASC) 5 MG tablet TAKE 1 TABLET BY MOUTH DAILY 90 tablet 2  . apixaban (ELIQUIS) 5 MG TABS tablet Take 1 tablet (5 mg total) by mouth 2 (two) times daily. 60 tablet 5  . furosemide (LASIX) 20 MG tablet Take 1 tablet (20 mg total) by mouth daily. 90 tablet 2  . HYDROcodone-acetaminophen (NORCO) 5-325 MG tablet Take 1 tablet by mouth 3 (three) times daily as needed for moderate pain or severe pain. 30 tablet 0  . LORazepam (ATIVAN) 0.5 MG tablet Take one tablet one hour prior to MRI scan 2 tablet 0  . meclizine (ANTIVERT) 25 MG tablet TAKE 1 TABLET(25 MG) BY MOUTH EVERY 6 HOURS AS NEEDED FOR DIZZINESS 100 tablet 2  . metoprolol tartrate (LOPRESSOR) 25 MG tablet Take 1 tablet (25 mg total) by mouth 3 (three) times daily. May take 1 extra tablet as needed for heart  rate over 100 315 tablet 1  . montelukast (SINGULAIR) 10 MG tablet TAKE 1 TABLET(10 MG) BY MOUTH AT BEDTIME 90 tablet 0  . Multiple Vitamins-Minerals (PRESERVISION AREDS 2 PO) Take 2 capsules by mouth daily.    . NON FORMULARY CBD oil    . ondansetron (ZOFRAN ODT) 4 MG disintegrating tablet Take 1 tablet (4 mg total) by mouth every 8 (eight) hours as needed for nausea or vomiting. 20 tablet 0  . pantoprazole (PROTONIX) 40 MG tablet TAKE 1 TABLET(40 MG) BY MOUTH TWICE DAILY 60 tablet 3    . UNABLE TO FIND Med Name: CBD oil     No current facility-administered medications for this visit.    PHYSICAL EXAMINATION: ECOG PERFORMANCE STATUS: 1 - Symptomatic but completely ambulatory  Vitals:   12/13/19 1058  BP: 122/73  Pulse: 78  Resp: 17  Temp: 99 F (37.2 C)  SpO2: 96%   Filed Weights   12/13/19 1058  Weight: 194 lb 8 oz (88.2 kg)    BREAST: No palpable masses or nodules in either right or left breasts. No palpable axillary supraclavicular or infraclavicular adenopathy no breast tenderness or nipple discharge. (exam performed in the presence of a chaperone)  LABORATORY DATA:  I have reviewed the data as listed CMP Latest Ref Rng & Units 10/22/2019 05/08/2019 05/07/2019  Glucose 65 - 99 mg/dL 99 93 105(H)  BUN 7 - 25 mg/dL 12 21 44(H)  Creatinine 0.60 - 0.88 mg/dL 0.80 0.66 0.73  Sodium 135 - 146 mmol/L 144 142 145  Potassium 3.5 - 5.3 mmol/L 3.6 3.9 4.0  Chloride 98 - 110 mmol/L 103 113(H) 114(H)  CO2 20 - 32 mmol/L 29 20(L) 22  Calcium 8.6 - 10.4 mg/dL 9.3 8.3(L) 8.6(L)  Total Protein 6.1 - 8.1 g/dL 6.1 - 5.3(L)  Total Bilirubin 0.2 - 1.2 mg/dL 0.8 - 1.1  Alkaline Phos 38 - 126 U/L - - 53  AST 10 - 35 U/L 20 - 18  ALT 6 - 29 U/L 16 - 19    Lab Results  Component Value Date   WBC 8.4 12/13/2019   HGB 16.0 (H) 12/13/2019   HCT 47.6 (H) 12/13/2019   MCV 93.2 12/13/2019   PLT 204 12/13/2019   NEUTROABS 5.9 12/13/2019    ASSESSMENT & PLAN:  Follicular 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 Retroperitoneal mass biopsy left side 78/67/6720: Follicular lymphoma low-grade grade 1-2, flow cytometry positive for CD10, 20, 3, 10, 5, BCL-2, BCl-6, CD21, Ki-67 less than 10%, stage IB (8X 4 cm mass)  Prior treatment:systemic chemotherapy with bendamustine and Rituxan 6 cycles ( From 08/13/2014 to 01/01/2015) followed by Rituxan maintenance every 2 months for 2 years completed 12/06/2016  PET/CT scan 01/16/2015: Interval decrease in size and  metabolic activity   scans to be done on an as-needed basis. There is no role of routine CT scans for surveillance for follicular lymphomaunless the patient has symptoms.  F/U annually by her PCP  Breast cancer of upper-outer quadrant of right female breast [facility]) Right breast cancer: Right mastectomy 04/14/2011: 2 foci of invasive ductal carcinoma 0.2 cm, grade 1 with background extensive DCIS, 0/5 lymph nodes negative,focus 1: ER 94%, PR 100%, Ki-67 5%, HER-2 negative: Focus to ER 100% PR 6% Ki-67 29% HER-2 negative Femara 2.5 mg daily from2013- 2018. Because her primary tumor was very small and low risk, we did not recommendextended adjuvant therapy.  Breast Cancer Surveillance: 1. Breast exam11/19/21: Right mastectomy 2. Mammogram 9/23/21noabnormalities. Postsurgical changes. Breast  Density Category B.  11/18/19: CT Lumbar spine: No mets, Spinal stenosis  Elevated hemoglobin: 16 g.  I discussed with her that she needs a sleep study to evaluate for sleep apnea.  She will discuss with her primary care physician and get that checked out.  There is no evidence of lymphoma or breast cancer.  She can be seen on an as-needed basis.   No orders of the defined types were placed in this encounter.  The patient has a good understanding of the overall plan. she agrees with it. she will call with any problems that may develop before the next visit here.  Total time spent: 20 mins including face to face time and time spent for planning, charting and coordination of care  Nicholas Lose, MD 12/13/2019  I, Cloyde Reams Dorshimer, am acting as scribe for Dr. Nicholas Lose.  I have reviewed the above documentation for accuracy and completeness, and I agree with the above.

## 2019-12-13 ENCOUNTER — Inpatient Hospital Stay: Payer: Medicare Other | Attending: Hematology and Oncology

## 2019-12-13 ENCOUNTER — Other Ambulatory Visit: Payer: Self-pay

## 2019-12-13 ENCOUNTER — Other Ambulatory Visit: Payer: Self-pay | Admitting: Family Medicine

## 2019-12-13 ENCOUNTER — Inpatient Hospital Stay: Payer: Medicare Other | Admitting: Hematology and Oncology

## 2019-12-13 ENCOUNTER — Telehealth: Payer: Self-pay | Admitting: Family Medicine

## 2019-12-13 DIAGNOSIS — C8203 Follicular lymphoma grade I, intra-abdominal lymph nodes: Secondary | ICD-10-CM | POA: Diagnosis present

## 2019-12-13 DIAGNOSIS — C50411 Malignant neoplasm of upper-outer quadrant of right female breast: Secondary | ICD-10-CM | POA: Diagnosis not present

## 2019-12-13 DIAGNOSIS — Z9221 Personal history of antineoplastic chemotherapy: Secondary | ICD-10-CM | POA: Insufficient documentation

## 2019-12-13 DIAGNOSIS — Z9011 Acquired absence of right breast and nipple: Secondary | ICD-10-CM | POA: Diagnosis not present

## 2019-12-13 DIAGNOSIS — Z170 Estrogen receptor positive status [ER+]: Secondary | ICD-10-CM | POA: Diagnosis not present

## 2019-12-13 DIAGNOSIS — D582 Other hemoglobinopathies: Secondary | ICD-10-CM

## 2019-12-13 LAB — CBC WITH DIFFERENTIAL (CANCER CENTER ONLY)
Abs Immature Granulocytes: 0.04 10*3/uL (ref 0.00–0.07)
Basophils Absolute: 0.1 10*3/uL (ref 0.0–0.1)
Basophils Relative: 1 %
Eosinophils Absolute: 0.2 10*3/uL (ref 0.0–0.5)
Eosinophils Relative: 3 %
HCT: 47.6 % — ABNORMAL HIGH (ref 36.0–46.0)
Hemoglobin: 16 g/dL — ABNORMAL HIGH (ref 12.0–15.0)
Immature Granulocytes: 1 %
Lymphocytes Relative: 15 %
Lymphs Abs: 1.2 10*3/uL (ref 0.7–4.0)
MCH: 31.3 pg (ref 26.0–34.0)
MCHC: 33.6 g/dL (ref 30.0–36.0)
MCV: 93.2 fL (ref 80.0–100.0)
Monocytes Absolute: 0.9 10*3/uL (ref 0.1–1.0)
Monocytes Relative: 11 %
Neutro Abs: 5.9 10*3/uL (ref 1.7–7.7)
Neutrophils Relative %: 69 %
Platelet Count: 204 10*3/uL (ref 150–400)
RBC: 5.11 MIL/uL (ref 3.87–5.11)
RDW: 12.9 % (ref 11.5–15.5)
WBC Count: 8.4 10*3/uL (ref 4.0–10.5)
nRBC: 0 % (ref 0.0–0.2)

## 2019-12-13 LAB — CMP (CANCER CENTER ONLY)
ALT: 18 U/L (ref 0–44)
AST: 20 U/L (ref 15–41)
Albumin: 4 g/dL (ref 3.5–5.0)
Alkaline Phosphatase: 104 U/L (ref 38–126)
Anion gap: 9 (ref 5–15)
BUN: 20 mg/dL (ref 8–23)
CO2: 29 mmol/L (ref 22–32)
Calcium: 9.7 mg/dL (ref 8.9–10.3)
Chloride: 106 mmol/L (ref 98–111)
Creatinine: 0.83 mg/dL (ref 0.44–1.00)
GFR, Estimated: >60 mL/min (ref >60)
Glucose, Bld: 87 mg/dL (ref 70–99)
Potassium: 4.1 mmol/L (ref 3.5–5.1)
Sodium: 144 mmol/L (ref 135–145)
Total Bilirubin: 0.8 mg/dL (ref 0.3–1.2)
Total Protein: 6.9 g/dL (ref 6.5–8.1)

## 2019-12-13 LAB — LACTATE DEHYDROGENASE: LDH: 212 U/L — ABNORMAL HIGH (ref 98–192)

## 2019-12-13 NOTE — Telephone Encounter (Signed)
Patient informed of the message below, stated she would prefer to have the referral to a sleep specialist and is aware someone will call with appt info.  Appt cancelled for 11/22 and message sent to PCP for orders.

## 2019-12-13 NOTE — Assessment & Plan Note (Signed)
Retroperitoneal mass biopsy left side 56/43/3295: Follicular lymphoma low-grade grade 1-2, flow cytometry positive for CD10, 20, 3, 10, 5, BCL-2, BCl-6, CD21, Ki-67 less than 10%, stage IB (8X 4 cm mass)  Treatment plan:systemic chemotherapy with bendamustine and Rituxan 6 cycles ( From 08/13/2014 to 01/01/2015) followed by Rituxan maintenance every 2 months for 2 years completed 12/06/2016  PET/CT scan 01/16/2015: Interval decrease in size and metabolic activity   scans will be done on an as-needed basis. There is no role of routine CT scans for surveillance for follicular lymphomaunless the patient has symptoms.  F/U annually by her PCP

## 2019-12-13 NOTE — Progress Notes (Signed)
Carelink Summary Report / Loop Recorder 

## 2019-12-13 NOTE — Telephone Encounter (Signed)
Referral has already been placed and Deb the referral coordinator advised pt to call [place] for scheduling

## 2019-12-13 NOTE — Assessment & Plan Note (Signed)
Right breast cancer: Right mastectomy 04/14/2011: 2 foci of invasive ductal carcinoma 0.2 cm, grade 1 with background extensive DCIS, 0/5 lymph nodes negative,focus 1: ER 94%, PR 100%, Ki-67 5%, HER-2 negative: Focus to ER 100% PR 6% Ki-67 29% HER-2 negative Femara 2.5 mg daily from2013- 2018. Because her primary tumor was very small and low risk, we did not recommendextended adjuvant therapy.  Breast Cancer Surveillance: 1. Breast exam11/19/21: Right mastectomy 2. Mammogram 9/23/21noabnormalities. Postsurgical changes. Breast Density Category B.  11/18/19: CT Lumbar spine: No mets, Spinal stenosis

## 2019-12-13 NOTE — Telephone Encounter (Signed)
Order placed

## 2019-12-13 NOTE — Telephone Encounter (Signed)
She can follow up with Dr. Volanda Napoleon if she would like, but I did just check his notes and he just wants her to have a sleep study to evaluate her elevated hemoglobin, so she really doesn't need to see Dr. Volanda Napoleon; she can just have referral for sleep specialist if she would like.

## 2019-12-13 NOTE — Telephone Encounter (Signed)
patient's cancer doctor told her that she is producing too much blood and that she needs to be seen by her PCP  I scheduled her to see Dr. Volanda Napoleon on Monday 11/22

## 2019-12-16 ENCOUNTER — Ambulatory Visit: Payer: Medicare Other | Admitting: Family Medicine

## 2019-12-16 ENCOUNTER — Telehealth: Payer: Self-pay | Admitting: Hematology and Oncology

## 2019-12-16 NOTE — Telephone Encounter (Signed)
No 11/19 los, no changes made to pt schedule

## 2019-12-25 ENCOUNTER — Encounter: Payer: Self-pay | Admitting: Pulmonary Disease

## 2019-12-25 ENCOUNTER — Ambulatory Visit (INDEPENDENT_AMBULATORY_CARE_PROVIDER_SITE_OTHER): Payer: Medicare Other | Admitting: Pulmonary Disease

## 2019-12-25 VITALS — BP 124/70 | HR 90 | Temp 97.9°F | Ht 66.0 in | Wt 198.0 lb

## 2019-12-25 DIAGNOSIS — G4733 Obstructive sleep apnea (adult) (pediatric): Secondary | ICD-10-CM | POA: Diagnosis not present

## 2019-12-25 NOTE — Patient Instructions (Signed)
High blood count can be caused by significant sleep apnea and oxygen deprivation at night  There is a possibility of sleep apnea if you are snoring significantly at night  We will schedule you for home sleep study  Update you with results as soon as obtained  If you have significant sleep apnea then CPAP therapy may be needed If oxygen levels are low at night then oxygen use at night may be an option  I will see you back in 3 months

## 2019-12-25 NOTE — Progress Notes (Signed)
Selena [name]    [id number]    14-May-1935  Primary Care Physician:Koberlein, Steele Berg, MD  Referring Physician: Caren Macadam, [place],  [place] [address]  Chief complaint:   Patient being seen for elevated blood count  HPI:  Concern for sleep apnea Admits to snoring Difficulty falling asleep at night sometimes takes up to 2 hours to fall asleep, tries to go to bed about 11 PM Wakes up with choking on occasions  Has been more short of breath recently, has been exacerbation of the vertigo  Wakes up with a dry mouth No morning headaches No night sweats No significant family history of sleep apnea that she can remember  She is not usually very sleepy during the day  She is tired during the day from multiple medical problems, does not take naps   Outpatient Encounter Medications as of 12/25/2019  Medication Sig  . albuterol (PROVENTIL) (2.5 MG/3ML) 0.083% nebulizer solution Take 3 mLs (2.5 mg total) by nebulization every 6 (six) hours as needed for wheezing or shortness of breath.  [name] amLODipine (NORVASC) 5 MG tablet TAKE 1 TABLET BY MOUTH DAILY  . apixaban (ELIQUIS) 5 MG TABS tablet Take 1 tablet (5 mg total) by mouth 2 (two) times daily.  . furosemide (LASIX) 20 MG tablet Take 1 tablet (20 mg total) by mouth daily.  [name] HYDROcodone-acetaminophen (NORCO) 5-325 MG tablet Take 1 tablet by mouth 3 (three) times daily as needed for moderate pain or severe pain.  [name] LORazepam (ATIVAN) 0.5 MG tablet Take one tablet one hour prior to MRI scan  . meclizine (ANTIVERT) 25 MG tablet TAKE 1 TABLET(25 MG) BY MOUTH EVERY 6 HOURS AS NEEDED FOR DIZZINESS  . metoprolol tartrate (LOPRESSOR) 25 MG tablet Take 1 tablet (25 mg total) by mouth 3 (three) times daily. May take 1 extra tablet as needed for heart rate over 100  . montelukast (SINGULAIR) 10 MG tablet TAKE 1 TABLET(10 MG) BY MOUTH AT BEDTIME  . Multiple Vitamins-Minerals (PRESERVISION AREDS 2 PO)  Take 2 capsules by mouth daily.  . NON FORMULARY CBD oil  . ondansetron (ZOFRAN ODT) 4 MG disintegrating tablet Take 1 tablet (4 mg total) by mouth every 8 (eight) hours as needed for nausea or vomiting.  . pantoprazole (PROTONIX) 40 MG tablet TAKE 1 TABLET(40 MG) BY MOUTH TWICE DAILY  . UNABLE TO FIND Med Name: CBD oil   No facility-administered encounter medications on file as of 12/25/2019.    Allergies as of 12/25/2019 - Review Complete 12/25/2019  Allergen Reaction Noted  . Codeine phosphate Other (See Comments)   . Adhesive [tape]  02/14/2018  . Codeine Other (See Comments) 03/23/2011    Past Medical History:  Diagnosis Date  . Anemia    PMH  . Aneurysm (arteriovenous) of coronary vessels   . Atrial fibrillation ([facility]) 01/01/2007  . BACK PAIN, UPPER 07/09/2008  . Breast cancer, stage 1 ([facility])   . Bruises easily   . Cataract    history  . Complication of anesthesia   . Diverticulitis   . DIVERTICULOSIS, COLON 12/26/2006  . Essential hypertension 10/27/2017  . Follicular non-Hodgkin's lymphoma ([facility])   . Goiter    multi-nodular  . Hx of colonoscopy 2009  . LIVER FUNCTION TESTS, ABNORMAL, HX OF 12/26/2007  . Memory loss 05/16/2007  . OSTEOARTHRITIS 12/26/2006   takes Diclofenac daily but has stopped for surgery  . PONV (postoperative nausea and vomiting)   .  Shortness of breath 05/28/2019  . Skin cancer   . VERTIGO 09/07/2009  . Wears dentures   . Wears glasses   . Wears hearing aid    bilateral    Past Surgical History:  Procedure Laterality Date  . ABDOMINAL HYSTERECTOMY    . ABLATION OF DYSRHYTHMIC FOCUS  2009  . APPENDECTOMY    . AUGMENTATION MAMMAPLASTY Right 2013  . BIOPSY  05/07/2019   Procedure: BIOPSY;  Surgeon: Rush Landmark Telford Nab., MD;  Location: [facility];  Service: Gastroenterology;;  . BREAST BIOPSY  2013  . BREAST RECONSTRUCTION  04/14/2011   Procedure: BREAST RECONSTRUCTION;  Surgeon: Crissie Reese, MD;  Location: [facility];  Service: Plastics;   Laterality: Right;  Placement of Right Breast Tissue Expander with  use of Flex HD for Breast Reconstruction  . BREAST SURGERY     right sm, snbx  . CATARACT EXTRACTION W/ INTRAOCULAR LENS  IMPLANT, BILATERAL  2010   bilateral  . COLONOSCOPY    . COLONOSCOPY    . ESOPHAGOGASTRODUODENOSCOPY (EGD) WITH PROPOFOL N/A 05/07/2019   Procedure: ESOPHAGOGASTRODUODENOSCOPY (EGD) WITH PROPOFOL;  Surgeon: Rush Landmark Telford Nab., MD;  Location: [facility];  Service: Gastroenterology;  Laterality: N/A;  . KNEE SURGERY  2004/2010   arthroscopic/ bilateral knee replacements  . LOOP RECORDER IMPLANT    . MASTECTOMY Right 2013  . PORT-A-CATH REMOVAL N/A 12/07/2016   Procedure: REMOVAL PORT-A-CATH;  Surgeon: Rolm Bookbinder, MD;  Location: [facility];  Service: General;  Laterality: N/A;  . PORTACATH PLACEMENT Right 08/11/2014   Procedure: INSERTION PORT-A-CATH WITH ULTRASOUND;  Surgeon: Rolm Bookbinder, MD;  Location: [facility];  Service: General;  Laterality: Right;  . [place]   right  . tmi  1998  . TONSILLECTOMY      Family History  Problem Relation Age of Onset  . Colon cancer Mother   . Cancer Mother        colon  . Colon cancer Maternal Aunt   . Cancer Maternal Uncle   . Prostate cancer Maternal Uncle   . Prostate cancer Maternal Uncle   . Other Maternal Uncle   . Cancer Sister        kidney  . Cancer Brother        lymph node  . Anesthesia problems Neg Hx   . Hypotension Neg Hx   . Malignant hyperthermia Neg Hx   . Pseudochol deficiency Neg Hx   . Breast cancer Neg Hx     Social History   Socioeconomic History  . Marital status: Widowed    Spouse name: Not on file  . Number of children: Not on file  . Years of education: Not on file  . Highest education level: Not on file  Occupational History  . Occupation: retired  Tobacco Use  . Smoking status: Former Smoker    Packs/day: 0.10    Years: 20.00    Pack years: 2.00    Types: Cigarettes    Quit date:  01/24/1978    Years since quitting: 41.9  . Smokeless tobacco: Never Used  Vaping Use  . Vaping Use: Never used  Substance and Sexual Activity  . Alcohol use: No  . Drug use: No  . Sexual activity: Yes    Birth control/protection: Surgical  Other Topics Concern  . Not on file  Social History Narrative  . Not on file   Social Determinants of Health   Financial Resource Strain: Low Risk   . Difficulty of Paying Living  Expenses: Not hard at all  Food Insecurity: No Food Insecurity  . Worried About Charity fundraiser in the Last Year: Never true  . Ran Out of Food in the Last Year: Never true  Transportation Needs: No Transportation Needs  . Lack of Transportation (Medical): No  . Lack of Transportation (Non-Medical): No  Physical Activity: Insufficiently Active  . Days of Exercise per Week: 7 days  . Minutes of Exercise per Session: 10 min  Stress: No Stress Concern Present  . Feeling of Stress : Not at all  Social Connections: Moderately Isolated  . Frequency of Communication with Friends and Family: More than three times a week  . Frequency of Social Gatherings with Friends and Family: Never  . Attends Religious Services: More than 4 times per year  . Active Member of Clubs or Organizations: No  . Attends Archivist Meetings: Never  . Marital Status: Widowed  Intimate Partner Violence: Not At Risk  . Fear of Current or Ex-Partner: No  . Emotionally Abused: No  . Physically Abused: No  . Sexually Abused: No    Review of Systems  Constitutional: Positive for fatigue.  Respiratory: Positive for shortness of breath.   Psychiatric/Behavioral: Positive for sleep disturbance.    Vitals:   12/25/19 1040  BP: 124/70  Pulse: 90  Temp: 97.9 F (36.6 C)  SpO2: 97%     Physical Exam Constitutional:      Appearance: She is obese.  HENT:     Nose: No congestion or rhinorrhea.     Mouth/Throat:     Mouth: Mucous membranes are moist.  Eyes:     General:         Right eye: No discharge.        Left eye: No discharge.  Cardiovascular:     Rate and Rhythm: Normal rate and regular rhythm.     Heart sounds: No murmur heard.  No friction rub.  Pulmonary:     Effort: No respiratory distress.     Breath sounds: No stridor. No wheezing or rhonchi.  Musculoskeletal:     Cervical back: No rigidity or tenderness.  Neurological:     Mental Status: She is alert.  Psychiatric:        Mood and Affect: Mood normal.    Data Reviewed: Diastolic dysfunction noted on echo from 09/07/2017  CT chest from 05/08/2019 reviewed by myself showing no infiltrative process in the lungs  Hematocrit of 45.4 recently  PFT from 2019 revealed decreased diffusing capacity  Assessment:  Snoring  Daytime fatigue  Shortness of breath on exertion -This is multifactorial  Vertigo which is limiting her activities significantly  Plan/Recommendations: We will obtain an overnight polysomnogram  Pathophysiology of sleep disordered breathing discussed with the patient Treatment options discussed with the patient  There is a likelihood that she does have some nocturnal desaturations from obesity which may need to be treated with oxygen supplementation at night  She is aware that she may have obstructive sleep apnea as well and is open to using CPAP therapy if needed   Sherrilyn Rist MD [facility] 12/25/2019, 10:49 AM  CC: Caren Macadam, MD

## 2019-12-27 ENCOUNTER — Telehealth: Payer: Self-pay | Admitting: Cardiovascular Disease

## 2019-12-27 NOTE — Telephone Encounter (Signed)
Linq 2 implanted 09/20/18 due to syncope.  LOV with Dr. Lovena Le 11/29/19, notes do not indicate plan to discontinue remote monitoring.  Will forward to MD for order to stop monitoring and to determine next follow-up if remote monitroing is not occurring.

## 2019-12-27 NOTE — Telephone Encounter (Signed)
Regular cardiologist is Educational psychologist, sees Dr. Lovena Le for EP    Patient states she was told if she comes in every 6 months she can unplug the loop recorder due to the price of things. She states she discussed with Dr. Lovena Le last month when she came into the office. Please call/advise   Thank you!

## 2019-12-30 NOTE — Telephone Encounter (Signed)
Patient is calling to follow up regarding remote monitoring. She states she was advised to reconsider over the weekend while awaiting advisement from Dr. Lovena Le. However, patient has determined she would like to discontinue use due to expenses. Please return call to further discuss.

## 2019-12-30 NOTE — Telephone Encounter (Signed)
Returning patients phone call.   Patient states per Dr. Lovena Le and her conversation, she will discontinue remote monitoring and see in office every 6 months. Sending to Dr. Lovena Le to update.   Please advised Dr. Lovena Le is this correct, I do not see it in any notes.

## 2019-12-30 NOTE — Telephone Encounter (Signed)
Per Dr. Lovena Le-- "I agreed to allow patient to stop having monthly checks-she could not afford.  Check ILR twice a yearly and if syncope"  GT   Will forward to device clinic.

## 2019-12-31 NOTE — Telephone Encounter (Signed)
Patient informed. Advised if has any syncopal events to call. Verbalized understanding.   Selena [name] - could you please arrange so patient will have apts. With Dr. Lovena Le every 6 months?

## 2020-01-10 ENCOUNTER — Ambulatory Visit (INDEPENDENT_AMBULATORY_CARE_PROVIDER_SITE_OTHER): Payer: Medicare Other

## 2020-01-10 DIAGNOSIS — R55 Syncope and collapse: Secondary | ICD-10-CM

## 2020-01-10 LAB — CUP PACEART REMOTE DEVICE CHECK
Date Time Interrogation Session: 20211217070459
Implantable Pulse Generator Implant Date: 20200827

## 2020-01-14 ENCOUNTER — Ambulatory Visit: Payer: Medicare Other

## 2020-01-14 ENCOUNTER — Other Ambulatory Visit: Payer: Self-pay

## 2020-01-14 DIAGNOSIS — G4733 Obstructive sleep apnea (adult) (pediatric): Secondary | ICD-10-CM

## 2020-01-16 ENCOUNTER — Telehealth: Payer: Self-pay | Admitting: Pulmonary Disease

## 2020-01-16 DIAGNOSIS — G4733 Obstructive sleep apnea (adult) (pediatric): Secondary | ICD-10-CM | POA: Diagnosis not present

## 2020-01-16 NOTE — Telephone Encounter (Signed)
Call patient  Sleep study result  Date of study: 01/14/2020  Impression: Mild obstructive sleep apnea No significant oxygen desaturations, none to contribute to elevated red blood cell counts History of insomnia  Recommendation: Options of treatment for mild obstructive sleep apnea will include CPAP therapy, oral device, watchful waiting with focus on weight loss and sleep position modification which may help snoring.  If CPAP is chosen as a choice of treatment, an auto titrating CPAP with pressure settings of 5-15 will be appropriate  An oral device will require referral to a dentist to fashion an oral device  Medications to help insomnia may be considered  Follow-up as previously scheduled- in about 4 to 6 weeks

## 2020-01-20 ENCOUNTER — Telehealth: Payer: Self-pay | Admitting: Family Medicine

## 2020-01-20 NOTE — Telephone Encounter (Signed)
Spoke with pt, states that she prefers to proceed with an oral appliance, but notes that she wears a full set of dentures and removes them QHS.  Pt wants to know if this would disqualify her from being able to use an oral appliance.    AO please advise, thanks!

## 2020-01-20 NOTE — Telephone Encounter (Signed)
pt want to know what CPAP machine she recommends for her sleep apnea

## 2020-01-20 NOTE — Telephone Encounter (Signed)
Spoke with the pt and notified of results/recs  She states would like to think about the different tx options and call back to let us know what she decides Will await her call back

## 2020-01-20 NOTE — Telephone Encounter (Signed)
Patient would like to discuss CPAP machine and treatment options. Patient phone number is [phone].

## 2020-01-21 NOTE — Telephone Encounter (Signed)
Yes it will  Full dentures will disqualify you You need some dentition to anchor device to

## 2020-01-21 NOTE — Telephone Encounter (Signed)
Called and spoke with patient, advised of recommendations of Dr. Wynona Neat.  She would like to proceed with the CPAP machine and would like to use [employer].  Order placed for CPAP machine. Advised that it could be 8-12 weeks before she receives it.  Patient will contact us when she received her machine. Nothing further needed.

## 2020-01-23 NOTE — Progress Notes (Signed)
Carelink Summary Report / Loop Recorder 

## 2020-02-14 ENCOUNTER — Other Ambulatory Visit: Payer: Self-pay | Admitting: Nurse Practitioner

## 2020-02-18 ENCOUNTER — Other Ambulatory Visit: Payer: Self-pay

## 2020-02-19 ENCOUNTER — Encounter: Payer: Self-pay | Admitting: Family Medicine

## 2020-02-19 ENCOUNTER — Ambulatory Visit (INDEPENDENT_AMBULATORY_CARE_PROVIDER_SITE_OTHER): Payer: Medicare Other | Admitting: Family Medicine

## 2020-02-19 VITALS — BP 112/80 | HR 79 | Temp 97.6°F | Ht 66.25 in | Wt 198.2 lb

## 2020-02-19 DIAGNOSIS — I5032 Chronic diastolic (congestive) heart failure: Secondary | ICD-10-CM

## 2020-02-19 DIAGNOSIS — I480 Paroxysmal atrial fibrillation: Secondary | ICD-10-CM

## 2020-02-19 DIAGNOSIS — Z13220 Encounter for screening for lipoid disorders: Secondary | ICD-10-CM

## 2020-02-19 DIAGNOSIS — I10 Essential (primary) hypertension: Secondary | ICD-10-CM

## 2020-02-19 DIAGNOSIS — G4700 Insomnia, unspecified: Secondary | ICD-10-CM | POA: Diagnosis not present

## 2020-02-19 DIAGNOSIS — R198 Other specified symptoms and signs involving the digestive system and abdomen: Secondary | ICD-10-CM

## 2020-02-19 DIAGNOSIS — Z0000 Encounter for general adult medical examination without abnormal findings: Secondary | ICD-10-CM | POA: Diagnosis not present

## 2020-02-19 MED ORDER — TRAZODONE HCL 50 MG PO TABS: 25.0000 mg | ORAL_TABLET | Freq: Every evening | ORAL | 3 refills | Status: DC | PRN

## 2020-02-19 NOTE — Progress Notes (Signed)
Selena [name] DOB: [birth year] Encounter date: 02/19/2020  This is a 85 y.o. female who presents with Chief Complaint  Patient presents with  . Annual Exam    History of present illness: *runny nose - thinks started when she fell a couple of years ago after fall. Starts when she is eating or sitting watching TV and will drip before she can get to it. Clear. Can't blow anything out. Has tried all allergy medications without benefit. Worse with bending over. No headaches. Smell is normal. Did ipratropium, flonase, antihistamine; none have helped.   *she wants bloodwork today  *last time she heard from specialist re cpap was about 3 weeks ago; wanted to hear about this.   *having trouble with bowel movements. Has them, but when she goes it si runny, brown, thin - like pinky finger. Goes about 4 times daily. Has increased gas. Has been having issues since first of year. Mom died from colon cancer so this got her worried. Doesn't feel bloated. No blood in stools. No nausea. Sometimes mucous with stools. Doesn't go much at a time. Has some urgency to go, but not feeling like she empties completely.   Insomnia: melatonin helps sometimes. Hard to turn off thinking at night. Can't stop thinking about what she needs to do. Has been rough for 3 months. Gets up when she can't sleep and watches tv; then returns to bed. No nap during day.    She has lost weight. Not sure why. Energy level good. Eating 3 meals/day. Makes full meals for self. Walking regularly in house - multiple laps. This has been increase.    Allergies  Allergen Reactions  . Codeine Phosphate Other (See Comments)    Hallucinations  . Adhesive [Tape]     Electrodes for EKG- skin blistering, erythema  . Codeine Other (See Comments)    Hallucinations  Other reaction(s): Other   Current Meds  Medication Sig  . amLODipine (NORVASC) 5 MG tablet TAKE 1 TABLET BY MOUTH DAILY  . apixaban (ELIQUIS) 5 MG TABS tablet Take 1 tablet  (5 mg total) by mouth 2 (two) times daily.  . furosemide (LASIX) 20 MG tablet Take 1 tablet (20 mg total) by mouth daily.  . Multiple Vitamins-Minerals (PRESERVISION AREDS 2 PO) Take 2 capsules by mouth daily.  . NON FORMULARY CBD oil  . pantoprazole (PROTONIX) 40 MG tablet TAKE 1 TABLET(40 MG) BY MOUTH TWICE DAILY  . traZODone (DESYREL) 50 MG tablet Take 0.5-1 tablets (25-50 mg total) by mouth at bedtime as needed for sleep.  [name] UNABLE TO FIND Med Name: CBD oil  . [DISCONTINUED] montelukast (SINGULAIR) 10 MG tablet TAKE 1 TABLET(10 MG) BY MOUTH AT BEDTIME    Review of Systems  Constitutional: Negative for activity change, appetite change, chills, fatigue, fever and unexpected weight change.  HENT: Positive for rhinorrhea. Negative for congestion, ear pain, hearing loss, sinus pressure, sinus pain, sore throat and trouble swallowing.   Eyes: Negative for pain and visual disturbance.  Respiratory: Negative for cough, chest tightness, shortness of breath and wheezing.   Cardiovascular: Negative for chest pain, palpitations and leg swelling.  Gastrointestinal: Negative for abdominal pain, blood in stool, constipation, diarrhea, nausea and vomiting.       Change in bowels; see HPI  Genitourinary: Negative for difficulty urinating and menstrual problem.  Musculoskeletal: Negative for arthralgias and back pain.  Skin: Negative for rash.  Neurological: Negative for dizziness, weakness, numbness and headaches.  Hematological: Negative for adenopathy. Does not bruise/bleed easily.  Psychiatric/Behavioral: Positive for sleep disturbance. Negative for suicidal ideas. The patient is not nervous/anxious.     Objective:  BP 112/80 (BP Location: Left Arm, Patient Position: Sitting, Cuff Size: Large)   Pulse 79   Temp 97.6 F (36.4 C) (Oral)   Ht 5' 6.25" (1.683 m)   Wt 198 lb 3.2 oz (89.9 kg)   SpO2 98%   BMI 31.75 kg/m   Weight: 198 lb 3.2 oz (89.9 kg)   BP Readings from Last 3 Encounters:   02/19/20 112/80  12/25/19 124/70  12/13/19 122/73   Wt Readings from Last 3 Encounters:  02/19/20 198 lb 3.2 oz (89.9 kg)  12/25/19 198 lb (89.8 kg)  12/13/19 194 lb 8 oz (88.2 kg)    Physical Exam Constitutional:      General: She is not in acute distress.    Appearance: She is well-developed and well-nourished.  HENT:     Head: Normocephalic and atraumatic.     Right Ear: External ear normal.     Left Ear: External ear normal.     Mouth/Throat:     Mouth: Oropharynx is clear and moist.     Pharynx: No oropharyngeal exudate.  Eyes:     Conjunctiva/sclera: Conjunctivae normal.     Pupils: Pupils are equal, round, and reactive to light.  Neck:     Thyroid: No thyromegaly.  Cardiovascular:     Rate and Rhythm: Normal rate and regular rhythm.     Heart sounds: Normal heart sounds. No murmur heard. No friction rub. No gallop.   Pulmonary:     Effort: Pulmonary effort is normal.     Breath sounds: Normal breath sounds.  Abdominal:     General: Bowel sounds are normal. There is no distension.     Palpations: Abdomen is soft. There is no mass.     Tenderness: There is no abdominal tenderness. There is no guarding.     Hernia: No hernia is present.  Genitourinary:    Rectum: Normal. Guaiac result negative. No mass or tenderness.  Musculoskeletal:        General: No tenderness, deformity or edema. Normal range of motion.     Cervical back: Normal range of motion and neck supple.  Lymphadenopathy:     Cervical: No cervical adenopathy.  Skin:    General: Skin is warm and dry.     Findings: No rash.  Neurological:     Mental Status: She is alert and oriented to person, place, and time.     Deep Tendon Reflexes: Strength normal. Reflexes normal.     Reflex Scores:      Tricep reflexes are 2+ on the right side and 2+ on the left side.      Bicep reflexes are 2+ on the right side and 2+ on the left side.      Brachioradialis reflexes are 2+ on the right side and 2+ on the  left side.      Patellar reflexes are 2+ on the right side and 2+ on the left side. Psychiatric:        Mood and Affect: Mood and affect normal.        Speech: Speech normal.        Behavior: Behavior normal.        Thought Content: Thought content normal.     Assessment/Plan   1. Preventative health care She is up-to-date with preventative healthcare although due to see GI.  I will put in referral for this.  2. Change in bowel movement See above.  We will have her talk with GI due to change in stools. - Ambulatory referral to Gastroenterology  3. Insomnia, unspecified type - traZODone (DESYREL) 50 MG tablet; Take 0.5-1 tablets (25-50 mg total) by mouth at bedtime as needed for sleep.  Dispense: 30 tablet; Refill: 3  4. Essential hypertension Well-controlled.  Continue current medications including amlodipine 5 mg daily. - CBC with Differential/Platelet; Future - Comprehensive metabolic panel; Future - Comprehensive metabolic panel - CBC with Differential/Platelet  5. Paroxysmal atrial fibrillation ([name]) Continue with Eliquis 5 mg twice daily.  Rate controlled.  6. Chronic diastolic CHF (congestive heart failure) ([facility]) Has been stable.  Follows with cardiology.  Continue Lasix 20 mg daily.  7. Lipid screening - Lipid panel; Future - Lipid panel  Return in about 6 months (around 08/18/2020) for Chronic condition visit; and pending bloodwork.    Micheline Rough, MD

## 2020-02-19 NOTE — Patient Instructions (Signed)
You could try taking fiber daily to help bulk the stools. Choices are metamucil or citracel (citracel sometimes is easier to tolerate).

## 2020-02-20 LAB — CBC WITH DIFFERENTIAL/PLATELET
Basophils Absolute: 0.1 K/uL (ref 0.0–0.1)
Basophils Relative: 0.6 % (ref 0.0–3.0)
Eosinophils Absolute: 0.3 K/uL (ref 0.0–0.7)
Eosinophils Relative: 2.7 % (ref 0.0–5.0)
HCT: 45 % (ref 36.0–46.0)
Hemoglobin: 15.2 g/dL — ABNORMAL HIGH (ref 12.0–15.0)
Lymphocytes Relative: 19.5 % (ref 12.0–46.0)
Lymphs Abs: 1.8 K/uL (ref 0.7–4.0)
MCHC: 33.7 g/dL (ref 30.0–36.0)
MCV: 94.8 fl (ref 78.0–100.0)
Monocytes Absolute: 0.9 K/uL (ref 0.1–1.0)
Monocytes Relative: 9.7 % (ref 3.0–12.0)
Neutro Abs: 6.3 K/uL (ref 1.4–7.7)
Neutrophils Relative %: 67.5 % (ref 43.0–77.0)
Platelets: 189 K/uL (ref 150.0–400.0)
RBC: 4.75 Mil/uL (ref 3.87–5.11)
RDW: 14.5 % (ref 11.5–15.5)
WBC: 9.4 K/uL (ref 4.0–10.5)

## 2020-02-20 LAB — COMPREHENSIVE METABOLIC PANEL
ALT: 16 U/L (ref 0–35)
AST: 19 U/L (ref 0–37)
Albumin: 4.4 g/dL (ref 3.5–5.2)
Alkaline Phosphatase: 98 U/L (ref 39–117)
BUN: 18 mg/dL (ref 6–23)
CO2: 32 mEq/L (ref 19–32)
Calcium: 9.7 mg/dL (ref 8.4–10.5)
Chloride: 104 mEq/L (ref 96–112)
Creatinine, Ser: 0.75 mg/dL (ref 0.40–1.20)
GFR: 74.18 mL/min (ref >60.00)
Glucose, Bld: 81 mg/dL (ref 70–99)
Potassium: 4 mEq/L (ref 3.5–5.1)
Sodium: 143 mEq/L (ref 135–145)
Total Bilirubin: 0.7 mg/dL (ref 0.2–1.2)
Total Protein: 6.7 g/dL (ref 6.0–8.3)

## 2020-02-20 LAB — LDL CHOLESTEROL, DIRECT: Direct LDL: 98 mg/dL

## 2020-02-20 LAB — LIPID PANEL
Cholesterol: 154 mg/dL (ref 0–200)
HDL: 43.6 mg/dL (ref >39.00)
NonHDL: 110.87
Total CHOL/HDL Ratio: 4
Triglycerides: 206 mg/dL — ABNORMAL HIGH (ref 0.0–149.0)
VLDL: 41.2 mg/dL — ABNORMAL HIGH (ref 0.0–40.0)

## 2020-02-27 ENCOUNTER — Other Ambulatory Visit: Payer: Self-pay | Admitting: Family Medicine

## 2020-03-08 ENCOUNTER — Other Ambulatory Visit (HOSPITAL_COMMUNITY): Payer: Medicare Other

## 2020-03-08 ENCOUNTER — Emergency Department (HOSPITAL_COMMUNITY): Payer: Medicare Other

## 2020-03-08 ENCOUNTER — Encounter (HOSPITAL_COMMUNITY): Payer: Self-pay | Admitting: Emergency Medicine

## 2020-03-08 ENCOUNTER — Inpatient Hospital Stay (HOSPITAL_COMMUNITY)
Admission: EM | Admit: 2020-03-08 | Discharge: 2020-03-10 | DRG: 177 | Disposition: A | Discharge status: Home / Self Care | Payer: Medicare Other | Attending: Internal Medicine | Admitting: Internal Medicine

## 2020-03-08 ENCOUNTER — Other Ambulatory Visit: Payer: Self-pay

## 2020-03-08 DIAGNOSIS — R0609 Other forms of dyspnea: Secondary | ICD-10-CM | POA: Diagnosis present

## 2020-03-08 DIAGNOSIS — Z9011 Acquired absence of right breast and nipple: Secondary | ICD-10-CM | POA: Diagnosis not present

## 2020-03-08 DIAGNOSIS — Z79899 Other long term (current) drug therapy: Secondary | ICD-10-CM

## 2020-03-08 DIAGNOSIS — Z85828 Personal history of other malignant neoplasm of skin: Secondary | ICD-10-CM | POA: Diagnosis not present

## 2020-03-08 DIAGNOSIS — Z9842 Cataract extraction status, left eye: Secondary | ICD-10-CM

## 2020-03-08 DIAGNOSIS — Z885 Allergy status to narcotic agent status: Secondary | ICD-10-CM | POA: Diagnosis not present

## 2020-03-08 DIAGNOSIS — I110 Hypertensive heart disease with heart failure: Secondary | ICD-10-CM | POA: Diagnosis present

## 2020-03-08 DIAGNOSIS — J1282 Pneumonia due to coronavirus disease 2019: Secondary | ICD-10-CM | POA: Diagnosis not present

## 2020-03-08 DIAGNOSIS — J440 Chronic obstructive pulmonary disease with acute lower respiratory infection: Secondary | ICD-10-CM | POA: Diagnosis present

## 2020-03-08 DIAGNOSIS — Z961 Presence of intraocular lens: Secondary | ICD-10-CM | POA: Diagnosis present

## 2020-03-08 DIAGNOSIS — U071 COVID-19: Principal | ICD-10-CM

## 2020-03-08 DIAGNOSIS — I5033 Acute on chronic diastolic (congestive) heart failure: Secondary | ICD-10-CM | POA: Diagnosis not present

## 2020-03-08 DIAGNOSIS — Z853 Personal history of malignant neoplasm of breast: Secondary | ICD-10-CM

## 2020-03-08 DIAGNOSIS — D696 Thrombocytopenia, unspecified: Secondary | ICD-10-CM | POA: Diagnosis present

## 2020-03-08 DIAGNOSIS — Z7901 Long term (current) use of anticoagulants: Secondary | ICD-10-CM

## 2020-03-08 DIAGNOSIS — Z96653 Presence of artificial knee joint, bilateral: Secondary | ICD-10-CM | POA: Diagnosis present

## 2020-03-08 DIAGNOSIS — I509 Heart failure, unspecified: Secondary | ICD-10-CM | POA: Diagnosis present

## 2020-03-08 DIAGNOSIS — Z87891 Personal history of nicotine dependence: Secondary | ICD-10-CM | POA: Diagnosis not present

## 2020-03-08 DIAGNOSIS — D709 Neutropenia, unspecified: Secondary | ICD-10-CM | POA: Diagnosis present

## 2020-03-08 DIAGNOSIS — I5031 Acute diastolic (congestive) heart failure: Secondary | ICD-10-CM | POA: Diagnosis not present

## 2020-03-08 DIAGNOSIS — I482 Chronic atrial fibrillation, unspecified: Secondary | ICD-10-CM | POA: Diagnosis present

## 2020-03-08 DIAGNOSIS — Z8572 Personal history of non-Hodgkin lymphomas: Secondary | ICD-10-CM

## 2020-03-08 DIAGNOSIS — Z974 Presence of external hearing-aid: Secondary | ICD-10-CM

## 2020-03-08 DIAGNOSIS — Z9841 Cataract extraction status, right eye: Secondary | ICD-10-CM | POA: Diagnosis not present

## 2020-03-08 HISTORY — DX: COVID-19: U07.1

## 2020-03-08 LAB — FIBRINOGEN: Fibrinogen: 478 mg/dL — ABNORMAL HIGH (ref 210–475)

## 2020-03-08 LAB — COMPREHENSIVE METABOLIC PANEL
ALT: 21 U/L (ref 0–44)
AST: 32 U/L (ref 15–41)
Albumin: 3.5 g/dL (ref 3.5–5.0)
Alkaline Phosphatase: 83 U/L (ref 38–126)
Anion gap: 13 (ref 5–15)
BUN: 11 mg/dL (ref 8–23)
CO2: 21 mmol/L — ABNORMAL LOW (ref 22–32)
Calcium: 8.3 mg/dL — ABNORMAL LOW (ref 8.9–10.3)
Chloride: 103 mmol/L (ref 98–111)
Creatinine, Ser: 0.68 mg/dL (ref 0.44–1.00)
GFR, Estimated: >60 mL/min (ref >60)
Glucose, Bld: 110 mg/dL — ABNORMAL HIGH (ref 70–99)
Potassium: 3.6 mmol/L (ref 3.5–5.1)
Sodium: 137 mmol/L (ref 135–145)
Total Bilirubin: 0.7 mg/dL (ref 0.3–1.2)
Total Protein: 5.5 g/dL — ABNORMAL LOW (ref 6.5–8.1)

## 2020-03-08 LAB — TRIGLYCERIDES: Triglycerides: 93 mg/dL (ref <150)

## 2020-03-08 LAB — LACTIC ACID, PLASMA
Lactic Acid, Venous: 1.6 mmol/L (ref 0.5–1.9)
Lactic Acid, Venous: 1.2 mmol/L (ref 0.5–1.9)

## 2020-03-08 LAB — FERRITIN: Ferritin: 136 ng/mL (ref 11–307)

## 2020-03-08 LAB — C-REACTIVE PROTEIN: CRP: 3.5 mg/dL — ABNORMAL HIGH (ref <1.0)

## 2020-03-08 LAB — BRAIN NATRIURETIC PEPTIDE: B Natriuretic Peptide: 158 pg/mL — ABNORMAL HIGH (ref 0.0–100.0)

## 2020-03-08 LAB — PROCALCITONIN: Procalcitonin: <0.1 ng/mL

## 2020-03-08 LAB — LACTATE DEHYDROGENASE: LDH: 239 U/L — ABNORMAL HIGH (ref 98–192)

## 2020-03-08 LAB — POC SARS CORONAVIRUS 2 AG -  ED: SARS Coronavirus 2 Ag: POSITIVE — AB

## 2020-03-08 LAB — D-DIMER, QUANTITATIVE: D-Dimer, Quant: 0.79 ug/mL-FEU — ABNORMAL HIGH (ref 0.00–0.50)

## 2020-03-08 MED ORDER — PRESERVISION AREDS 2 PO CAPS: ORAL_CAPSULE | Freq: Every day | ORAL | Status: DC

## 2020-03-08 MED ORDER — ACETAMINOPHEN 325 MG PO TABS
650.0000 mg | ORAL_TABLET | ORAL | Status: DC | PRN
  Administered 2020-03-08 (×2): 650 mg via ORAL
  Filled 2020-03-08 (×2): qty 2

## 2020-03-08 MED ORDER — IPRATROPIUM BROMIDE HFA 17 MCG/ACT IN AERS
2.0000 | INHALATION_SPRAY | Freq: Four times a day (QID) | RESPIRATORY_TRACT | Status: DC
  Administered 2020-03-08 – 2020-03-10 (×7): 2 via RESPIRATORY_TRACT
  Filled 2020-03-08 (×2): qty 12.9

## 2020-03-08 MED ORDER — PANTOPRAZOLE SODIUM 40 MG PO TBEC
40.0000 mg | DELAYED_RELEASE_TABLET | Freq: Two times a day (BID) | ORAL | Status: DC
  Administered 2020-03-08 – 2020-03-10 (×4): 40 mg via ORAL
  Filled 2020-03-08 (×4): qty 1

## 2020-03-08 MED ORDER — AMLODIPINE BESYLATE 5 MG PO TABS
5.0000 mg | ORAL_TABLET | Freq: Every day | ORAL | Status: DC
Start: 2020-03-09 — End: 2020-03-10
  Administered 2020-03-09 – 2020-03-10 (×2): 5 mg via ORAL
  Filled 2020-03-08 (×2): qty 1

## 2020-03-08 MED ORDER — FUROSEMIDE 10 MG/ML IJ SOLN
20.0000 mg | Freq: Every day | INTRAMUSCULAR | Status: DC
  Administered 2020-03-08 – 2020-03-09 (×2): 20 mg via INTRAVENOUS
  Filled 2020-03-08 (×2): qty 2

## 2020-03-08 MED ORDER — METOPROLOL TARTRATE 25 MG PO TABS
25.0000 mg | ORAL_TABLET | Freq: Three times a day (TID) | ORAL | Status: DC
  Administered 2020-03-08 – 2020-03-10 (×6): 25 mg via ORAL
  Filled 2020-03-08 (×6): qty 1

## 2020-03-08 MED ORDER — METHYLPREDNISOLONE SODIUM SUCC 125 MG IJ SOLR
0.5000 mg/kg | Freq: Two times a day (BID) | INTRAMUSCULAR | Status: DC
  Administered 2020-03-08 – 2020-03-10 (×4): 43.75 mg via INTRAVENOUS
  Filled 2020-03-08 (×4): qty 2

## 2020-03-08 MED ORDER — MONTELUKAST SODIUM 10 MG PO TABS
10.0000 mg | ORAL_TABLET | Freq: Every day | ORAL | Status: DC
  Administered 2020-03-08 – 2020-03-09 (×2): 10 mg via ORAL
  Filled 2020-03-08 (×3): qty 1

## 2020-03-08 MED ORDER — GUAIFENESIN-DM 100-10 MG/5ML PO SYRP
10.0000 mL | ORAL_SOLUTION | ORAL | Status: DC | PRN
  Administered 2020-03-08: 10 mL via ORAL
  Filled 2020-03-08: qty 10

## 2020-03-08 MED ORDER — APIXABAN 5 MG PO TABS
5.0000 mg | ORAL_TABLET | Freq: Two times a day (BID) | ORAL | Status: DC
  Administered 2020-03-08 – 2020-03-10 (×4): 5 mg via ORAL
  Filled 2020-03-08 (×4): qty 1

## 2020-03-08 MED ORDER — PREDNISONE 5 MG PO TABS: 50.0000 mg | ORAL_TABLET | Freq: Every day | ORAL | Status: DC

## 2020-03-08 MED ORDER — SODIUM CHLORIDE 0.9 % IV SOLN
100.0000 mg | Freq: Every day | INTRAVENOUS | Status: AC
  Administered 2020-03-09 – 2020-03-10 (×2): 100 mg via INTRAVENOUS
  Filled 2020-03-08 (×3): qty 20

## 2020-03-08 MED ORDER — SODIUM CHLORIDE 0.9% FLUSH: 3.0000 mL | INTRAVENOUS | Status: DC | PRN

## 2020-03-08 MED ORDER — SODIUM CHLORIDE 0.9 % IV SOLN
250.0000 mL | INTRAVENOUS | Status: DC | PRN
Start: 2020-03-08 — End: 2020-03-10

## 2020-03-08 MED ORDER — PROSIGHT PO TABS
1.0000 | ORAL_TABLET | Freq: Every day | ORAL | Status: DC
  Administered 2020-03-08 – 2020-03-10 (×3): 1 via ORAL
  Filled 2020-03-08 (×3): qty 1

## 2020-03-08 MED ORDER — SODIUM CHLORIDE 0.9 % IV SOLN
200.0000 mg | Freq: Once | INTRAVENOUS | Status: AC
  Administered 2020-03-08: 200 mg via INTRAVENOUS
  Filled 2020-03-08: qty 200

## 2020-03-08 MED ORDER — ONDANSETRON HCL 4 MG/2ML IJ SOLN: 4.0000 mg | Freq: Four times a day (QID) | INTRAMUSCULAR | Status: DC | PRN

## 2020-03-08 MED ORDER — SODIUM CHLORIDE 0.9% FLUSH
3.0000 mL | Freq: Two times a day (BID) | INTRAVENOUS | Status: DC
  Administered 2020-03-08 – 2020-03-10 (×5): 3 mL via INTRAVENOUS

## 2020-03-08 NOTE — ED Provider Notes (Signed)
[facility] EMERGENCY DEPARTMENT Provider Note   CSN: [id number] Arrival date & time: 03/08/20  1127     History Chief Complaint  Patient presents with  . Shortness of Breath    Selena [name] is a 85 y.o. female.  HPI 85 year old female history of A. fib RVR, on Eliquis, CHF, history of breast cancer, history of rustic aortic aneurysm, history of non-Hodgkin's lymphoma, presents today with dyspnea and new onset/diagnosis of Covid.  Patient states that she is fully vaccinated.  On Thursday she began having some body aches, fever, and aches.  She had a home test that was positive for Covid on Friday.  She has begun coughing and has become dyspneic.  She called EMS secondary to dyspnea.  O2 sats were 90% upon their arrival.  She has responded to oxygen.    Past Medical History:  Diagnosis Date  . Anemia    PMH  . Aneurysm (arteriovenous) of coronary vessels   . Atrial fibrillation ([place]) 01/01/2007  . BACK PAIN, UPPER 07/09/2008  . Breast cancer, stage 1 ([place])   . Bruises easily   . Cataract    history  . Complication of anesthesia   . Diverticulitis   . DIVERTICULOSIS, COLON 12/26/2006  . Essential hypertension 10/27/2017  . Follicular non-Hodgkin's lymphoma ([place])   . Goiter    multi-nodular  . Hx of colonoscopy 2009  . LIVER FUNCTION TESTS, ABNORMAL, HX OF 12/26/2007  . Memory loss 05/16/2007  . OSTEOARTHRITIS 12/26/2006   takes Diclofenac daily but has stopped for surgery  . PONV (postoperative nausea and vomiting)   . Shortness of breath 05/28/2019  . Skin cancer   . VERTIGO 09/07/2009  . Wears dentures   . Wears glasses   . Wears hearing aid    bilateral    Patient Active Problem List   Diagnosis Date Noted  . Cough 05/28/2019  . Shortness of breath 05/28/2019  . Acute gastric ulcer with hemorrhage   . Chronic diastolic CHF (congestive heart failure) ([place]) 10/16/2018  . History of atrioventricular nodal ablation 10/16/2018  . Thoracic aortic  aneurysm ([place]) 10/16/2018  . Syncope 08/21/2018  . S/P ablation of atrial flutter 07/17/2018  . Upper airway cough syndrome 10/27/2017  . Essential hypertension 10/27/2017  . DOE (dyspnea on exertion) 10/26/2017  . Anxiety 09/10/2014  . Insomnia 09/10/2014  . Follicular 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 ([place]) 07/31/2014  . Paraspinal mass 07/16/2014  . Breast cancer of upper-outer quadrant of right female breast ([place]) 03/21/2011  . VERTIGO 09/07/2009  . Backache 07/09/2008  . FATIGUE 03/18/2008  . LFTs abnormal 12/26/2007  . MEMORY LOSS 05/16/2007  . ATRIAL FIBRILLATION 01/01/2007  . DIVERTICULOSIS, COLON 12/26/2006  . Osteoarthritis 12/26/2006    Past Surgical History:  Procedure Laterality Date  . ABDOMINAL HYSTERECTOMY    . ABLATION OF DYSRHYTHMIC FOCUS  2009  . APPENDECTOMY    . AUGMENTATION MAMMAPLASTY Right 2013  . BIOPSY  05/07/2019   Procedure: BIOPSY;  Surgeon: [place] Landmark Telford Nab., MD;  Location: [facility];  Service: Gastroenterology;;  . BREAST BIOPSY  2013  . BREAST RECONSTRUCTION  04/14/2011   Procedure: BREAST RECONSTRUCTION;  Surgeon: Crissie Reese, MD;  Location: [facility];  Service: Plastics;  Laterality: Right;  Placement of Right Breast Tissue Expander with  use of Flex HD for Breast Reconstruction  . BREAST SURGERY     right sm, snbx  . CATARACT EXTRACTION W/ INTRAOCULAR LENS  IMPLANT, BILATERAL  2010  bilateral  . COLONOSCOPY    . COLONOSCOPY    . ESOPHAGOGASTRODUODENOSCOPY (EGD) WITH PROPOFOL N/A 05/07/2019   Procedure: ESOPHAGOGASTRODUODENOSCOPY (EGD) WITH PROPOFOL;  Surgeon: [place] Landmark Telford Nab., MD;  Location: [facility];  Service: Gastroenterology;  Laterality: N/A;  . KNEE SURGERY  2004/2010   arthroscopic/ bilateral knee replacements  . LOOP RECORDER IMPLANT    . MASTECTOMY Right 2013  . PORT-A-CATH REMOVAL N/A 12/07/2016   Procedure: REMOVAL PORT-A-CATH;  Surgeon: Rolm Bookbinder, MD;  Location: [facility];  Service: General;   Laterality: N/A;  . PORTACATH PLACEMENT Right 08/11/2014   Procedure: INSERTION PORT-A-CATH WITH ULTRASOUND;  Surgeon: Rolm Bookbinder, MD;  Location: [facility];  Service: General;  Laterality: Right;  . [place]   right  . tmi  1998  . TONSILLECTOMY       OB History   No obstetric history on file.     Family History  Problem Relation Age of Onset  . Colon cancer Mother   . Cancer Mother        colon  . Colon cancer Maternal Aunt   . Cancer Maternal Uncle   . Prostate cancer Maternal Uncle   . Prostate cancer Maternal Uncle   . Other Maternal Uncle   . Cancer Sister        kidney  . Cancer Brother        lymph node  . Anesthesia problems Neg Hx   . Hypotension Neg Hx   . Malignant hyperthermia Neg Hx   . Pseudochol deficiency Neg Hx   . Breast cancer Neg Hx     Social History   Tobacco Use  . Smoking status: Former Smoker    Packs/day: 0.10    Years: 20.00    Pack years: 2.00    Types: Cigarettes    Quit date: 01/24/1978    Years since quitting: 42.1  . Smokeless tobacco: Never Used  Vaping Use  . Vaping Use: Never used  Substance Use Topics  . Alcohol use: No  . Drug use: No    Home Medications Prior to Admission medications   Medication Sig Start Date End Date Taking? Authorizing Provider  montelukast (SINGULAIR) 10 MG tablet TAKE 1 TABLET(10 MG) BY MOUTH AT BEDTIME 02/28/20   Koberlein, Junell C, MD  amLODipine (NORVASC) 5 MG tablet TAKE 1 TABLET BY MOUTH DAILY 11/07/19   Burnell Blanks, MD  apixaban (ELIQUIS) 5 MG TABS tablet Take 1 tablet (5 mg total) by mouth 2 (two) times daily. 10/30/19   Burnell Blanks, MD  furosemide (LASIX) 20 MG tablet Take 1 tablet (20 mg total) by mouth daily. 08/26/19   Burnell Blanks, MD  Multiple Vitamins-Minerals (PRESERVISION AREDS 2 PO) Take 2 capsules by mouth daily.    [provider]  NON FORMULARY CBD oil    [provider]  pantoprazole (PROTONIX) 40 MG tablet  TAKE 1 TABLET(40 MG) BY MOUTH TWICE DAILY 02/15/20   Noralyn Pick, NP  traZODone (DESYREL) 50 MG tablet Take 0.5-1 tablets (25-50 mg total) by mouth at bedtime as needed for sleep. 02/19/20   Caren Macadam, MD  UNABLE TO FIND Med Name: CBD oil    [provider]    Allergies    Codeine phosphate, Adhesive [tape], and Codeine  Review of Systems   Review of Systems  All other systems reviewed and are negative.   Physical Exam Updated Vital Signs There were no vitals taken for this visit.  Physical Exam Vitals and nursing note reviewed.  Constitutional:      General: She is not in acute distress.    Appearance: She is well-developed. She is obese.  HENT:     Head: Normocephalic.     Mouth/Throat:     Comments: Dry Eyes:     Pupils: Pupils are equal, round, and reactive to light.  Cardiovascular:     Rate and Rhythm: Normal rate and regular rhythm.  Pulmonary:     Effort: Tachypnea present.  Chest:     Comments: Chest wall with mastectomies with reconstruction right chest wall Musculoskeletal:     Cervical back: Normal range of motion.  Neurological:     Mental Status: She is alert.     ED Results / Procedures / Treatments   Labs (all labs ordered are listed, but only abnormal results are displayed) Labs Reviewed - No data to display  EKG EKG Interpretation  Date/Time:  Sunday March 08 2020 11:33:51 EST Ventricular Rate:  92 PR Interval:    QRS Duration: 103 QT Interval:  370 QTC Calculation: 458 R Axis:   57 Text Interpretation: Atrial fibrillation Low voltage, precordial leads Minimal ST depression, inferior leads Confirmed by Arlynn Stare ([id number]) on 03/08/2020 12:50:48 PM   Radiology DG Chest Port 1 View  Result Date: 03/08/2020 CLINICAL DATA:  PT BIBA from home with cough, fever, chills, and SOB. Pt tested positive for COVID 2 days ago. Pt is fully vaccinated with booster. EXAM: PORTABLE CHEST 1 VIEW COMPARISON:  05/28/2019  FINDINGS: Loop recorder noted. Atherosclerotic calcification of the aortic arch. Mild enlargement of the cardiopericardial silhouette. Hazy opacities in the lungs most notably at the left lung base and in the right mid lung are observed. Chronic accentuated lung interstitium. Right breast implant noted contributing to the density along the right hemithorax. Mild rightward tracheal deviation likely due to known left eccentric goiter. IMPRESSION: 1. Hazy opacities in the lungs, left greater than right, favoring indistinct multilobar pneumonia and possibly a manifestation of COVID pneumonia. 2. Mild enlargement of the cardiopericardial silhouette. 3. Chronic accentuated lung interstitium. Electronically Signed   By: Walter  Liebkemann M.D.   On: 03/08/2020 12:00    Procedures Procedures   Medications Ordered in ED Medications - No data to display  ED Course  I have reviewed the triage vital signs and the nursing notes.  Pertinent labs & imaging results that were available during my care of the patient were reviewed by me and considered in my medical decision making (see chart for details).    MDM Rules/Calculators/A&P                           84  year old female presents today with Covid pneumonia.  Chest x-Perrie Ragin with bilateral lung opacities.  She has a new oxygen requirement with initial sats at 90%, now with sats 95 to 98% on 2 L. Discussed with Dr. Roosevelt Locks who will see for admission  Final Clinical Impression(s) / ED Diagnoses Final diagnoses:  Pneumonia due to COVID-19 virus    Rx / DC Orders ED Discharge Orders    None       Pattricia Boss, MD 03/08/20 1309

## 2020-03-08 NOTE — ED Notes (Signed)
Pt is 97% on RA at this time. Pt has a force strong non productive cough assessed.

## 2020-03-08 NOTE — ED Triage Notes (Signed)
PT BIBA from home with cough, fever, chills, and SOB. Pt tested positive for COVID 2 days ago. Pt is fully vaccinated with booster. Highest temp at home 99.1 Room air saturation for EMS 90%.

## 2020-03-08 NOTE — H&P (Signed)
History and Physical    Selena [name] KZS:[id number] DOB: 1935-07-15 DOA: 03/08/2020  PCP: Caren Macadam, MD (Confirm with patient/family/NH records and if not entered, this has to be entered at [facility] point of entry) Patient coming from: Home  I have personally briefly reviewed patient's old medical records in [place]  Chief Complaint: Cough, SOB  HPI: Selena [name] is a 85 y.o. female with medical history significant of chronic diastolic CHF, HTN, A. fib on Eliquis, benign positional vertigo, remote history of non-Hodgkin's lymphoma, presented with new onset of cough fever and increasing shortness of breath.  Her symptoms started 2 days ago, with initial symptoms of runny nose sore throat and then muscle ache low-grade fever with T-max 99s, dry cough and increasing shortness of breath.  Did a home test for COVID and turned positive on Friday.  Symptoms worsen overnight yesterday.  EMS arrived found patient O2 saturation 90% on room air but stabilized with 2 L.  Patient lives by herself and over the last month, increasingly she started to feel more shortness of breath, she used to able to walk in her yards 5 rounds but reduced to 2 rounds due to shortness of breath.  She denied any increasing of leg swelling, no wheezing.  ED Course: Chest x-ray showed acute on chronic infiltrates bilaterally and multifocal infiltrates compatible with COVID-19 pneumonia.  WBC 1.9, neutrophil 0.4.  Review of Systems: As per HPI otherwise 14 point review of systems negative.    Past Medical History:  Diagnosis Date  . Anemia    PMH  . Aneurysm (arteriovenous) of coronary vessels   . Atrial fibrillation ([place]) 01/01/2007  . BACK PAIN, UPPER 07/09/2008  . Breast cancer, stage 1 ([place])   . Bruises easily   . Cataract    history  . Complication of anesthesia   . Diverticulitis   . DIVERTICULOSIS, COLON 12/26/2006  . Essential hypertension 10/27/2017  . Follicular non-Hodgkin's lymphoma  ([place])   . Goiter    multi-nodular  . Hx of colonoscopy 2009  . LIVER FUNCTION TESTS, ABNORMAL, HX OF 12/26/2007  . Memory loss 05/16/2007  . OSTEOARTHRITIS 12/26/2006   takes Diclofenac daily but has stopped for surgery  . PONV (postoperative nausea and vomiting)   . Shortness of breath 05/28/2019  . Skin cancer   . VERTIGO 09/07/2009  . Wears dentures   . Wears glasses   . Wears hearing aid    bilateral    Past Surgical History:  Procedure Laterality Date  . ABDOMINAL HYSTERECTOMY    . ABLATION OF DYSRHYTHMIC FOCUS  2009  . APPENDECTOMY    . AUGMENTATION MAMMAPLASTY Right 2013  . BIOPSY  05/07/2019   Procedure: BIOPSY;  Surgeon: [place] Landmark Telford Nab., MD;  Location: [facility];  Service: Gastroenterology;;  . BREAST BIOPSY  2013  . BREAST RECONSTRUCTION  04/14/2011   Procedure: BREAST RECONSTRUCTION;  Surgeon: Crissie Reese, MD;  Location: [facility];  Service: Plastics;  Laterality: Right;  Placement of Right Breast Tissue Expander with  use of Flex HD for Breast Reconstruction  . BREAST SURGERY     right sm, snbx  . CATARACT EXTRACTION W/ INTRAOCULAR LENS  IMPLANT, BILATERAL  2010   bilateral  . COLONOSCOPY    . COLONOSCOPY    . ESOPHAGOGASTRODUODENOSCOPY (EGD) WITH PROPOFOL N/A 05/07/2019   Procedure: ESOPHAGOGASTRODUODENOSCOPY (EGD) WITH PROPOFOL;  Surgeon: [place] Landmark Telford Nab., MD;  Location: [facility];  Service: Gastroenterology;  Laterality: N/A;  . KNEE SURGERY  2004/2010  arthroscopic/ bilateral knee replacements  . LOOP RECORDER IMPLANT    . MASTECTOMY Right 2013  . PORT-A-CATH REMOVAL N/A 12/07/2016   Procedure: REMOVAL PORT-A-CATH;  Surgeon: Rolm Bookbinder, MD;  Location: [facility];  Service: General;  Laterality: N/A;  . PORTACATH PLACEMENT Right 08/11/2014   Procedure: INSERTION PORT-A-CATH WITH ULTRASOUND;  Surgeon: Rolm Bookbinder, MD;  Location: [facility];  Service: General;  Laterality: Right;  . [place]   right  . tmi  1998  .  TONSILLECTOMY       reports that she quit smoking about 42 years ago. Her smoking use included cigarettes. She has a 2.00 pack-year smoking history. She has never used smokeless tobacco. She reports that she does not drink alcohol and does not use drugs.  Allergies  Allergen Reactions  . Codeine Phosphate Other (See Comments)    Hallucinations  . Adhesive [Tape]     Electrodes for EKG- skin blistering, erythema  . Codeine Other (See Comments)    Hallucinations     Family History  Problem Relation Age of Onset  . Colon cancer Mother   . Cancer Mother        colon  . Colon cancer Maternal Aunt   . Cancer Maternal Uncle   . Prostate cancer Maternal Uncle   . Prostate cancer Maternal Uncle   . Other Maternal Uncle   . Cancer Sister        kidney  . Cancer Brother        lymph node  . Anesthesia problems Neg Hx   . Hypotension Neg Hx   . Malignant hyperthermia Neg Hx   . Pseudochol deficiency Neg Hx   . Breast cancer Neg Hx     Prior to Admission medications   Medication Sig Start Date End Date Taking? Authorizing Provider  amLODipine (NORVASC) 5 MG tablet TAKE 1 TABLET BY MOUTH DAILY 11/07/19  Yes Burnell Blanks, MD  apixaban (ELIQUIS) 5 MG TABS tablet Take 1 tablet (5 mg total) by mouth 2 (two) times daily. 10/30/19  Yes Burnell Blanks, MD  furosemide (LASIX) 20 MG tablet Take 1 tablet (20 mg total) by mouth daily. 08/26/19  Yes Burnell Blanks, MD  metoprolol tartrate (LOPRESSOR) 25 MG tablet Take 25 mg by mouth 3 (three) times daily. MAY TAKE ONE EXTRA TABLET FOR HEART RATE OVER 100.   Yes [provider]  montelukast (SINGULAIR) 10 MG tablet TAKE 1 TABLET(10 MG) BY MOUTH AT BEDTIME Patient taking differently: Take 10 mg by mouth at bedtime. 02/28/20  Yes Koberlein, Steele Berg, MD  Multiple Vitamins-Minerals (PRESERVISION AREDS 2 PO) Take 2 capsules by mouth daily.   Yes [provider]  NON FORMULARY CBD oil   Yes [provider]  pantoprazole (PROTONIX) 40 MG tablet TAKE 1 TABLET(40 MG) BY MOUTH TWICE DAILY Patient taking differently: Take 40 mg by mouth 2 (two) times daily. 02/15/20  Yes Noralyn Pick, NP  traZODone (DESYREL) 50 MG tablet Take 0.5-1 tablets (25-50 mg total) by mouth at bedtime as needed for sleep. Patient not taking: No sig reported 02/19/20   Caren Macadam, MD    Physical Exam: Vitals:   03/08/20 1132 03/08/20 1147 03/08/20 1230 03/08/20 1300  BP: 105/65  114/64   Pulse: 87  84   Resp: 20  19   SpO2: 99% 98% 96%   Weight:    88 kg    Constitutional: NAD, calm, comfortable Vitals:   03/08/20  1132 03/08/20 1147 03/08/20 1230 03/08/20 1300  BP: 105/65  114/64   Pulse: 87  84   Resp: 20  19   SpO2: 99% 98% 96%   Weight:    88 kg   Eyes: PERRL, lids and conjunctivae normal ENMT: Mucous membranes are moist. Posterior pharynx clear of any exudate or lesions.Normal dentition.  Neck: normal, supple, no masses, no thyromegaly Respiratory: clear to auscultation bilaterally, no wheezing, fine crackles on B/L lower fields. Increasing respiratory effort and talking in broken sentences. No accessory muscle use.  Cardiovascular: Irregular heart rate, no murmurs / rubs / gallops. Trace extremity edema. 2+ pedal pulses. No carotid bruits.  Abdomen: no tenderness, no masses palpated. No hepatosplenomegaly. Bowel sounds positive.  Musculoskeletal: no clubbing / cyanosis. No joint deformity upper and lower extremities. Good ROM, no contractures. Normal muscle tone.  Skin: no rashes, lesions, ulcers. No induration Neurologic: CN 2-12 grossly intact. Sensation intact, DTR normal. Strength 5/5 in all 4.  Psychiatric: Normal judgment and insight. Alert and oriented x 3. Normal mood.     Labs on Admission: I have personally reviewed following labs and imaging studies  CBC: Recent Labs  Lab 03/08/20 1141  WBC 1.9*  NEUTROABS 0.4*  HGB 14.0  HCT 40.7  MCV 93.3  PLT 114*   Basic  Metabolic Panel: Recent Labs  Lab 03/08/20 1141  NA 137  K 3.6  CL 103  CO2 21*  GLUCOSE 110*  BUN 11  CREATININE 0.68  CALCIUM 8.3*   GFR: Estimated Creatinine Clearance: 60.9 mL/min (by C-G formula based on SCr of 0.68 mg/dL). Liver Function Tests: Recent Labs  Lab 03/08/20 1141  AST 32  ALT 21  ALKPHOS 83  BILITOT 0.7  PROT 5.5*  ALBUMIN 3.5   No results for input(s): LIPASE, AMYLASE in the last 168 hours. No results for input(s): AMMONIA in the last 168 hours. Coagulation Profile: No results for input(s): INR, PROTIME in the last 168 hours. Cardiac Enzymes: No results for input(s): CKTOTAL, CKMB, CKMBINDEX, TROPONINI in the last 168 hours. BNP (last 3 results) No results for input(s): PROBNP in the last 8760 hours. HbA1C: No results for input(s): HGBA1C in the last 72 hours. CBG: No results for input(s): GLUCAP in the last 168 hours. Lipid Profile: Recent Labs    03/08/20 1141  TRIG 93   Thyroid Function Tests: No results for input(s): TSH, T4TOTAL, FREET4, T3FREE, THYROIDAB in the last 72 hours. Anemia Panel: Recent Labs    03/08/20 1141  FERRITIN 136   Urine analysis:    Component Value Date/Time   COLORURINE LT. YELLOW 12/26/2011 1110   APPEARANCEUR CLEAR 12/26/2011 1110   LABSPEC 1.015 12/26/2011 1110   PHURINE 6.0 12/26/2011 1110   GLUCOSEU NEGATIVE 12/26/2011 1110   HGBUR MODERATE 12/26/2011 1110   BILIRUBINUR N 04/19/2017 [place] 12/26/2011 1110   PROTEINUR trace 04/19/2017 1604   PROTEINUR NEGATIVE 02/01/2008 1412   UROBILINOGEN 0.2 04/19/2017 1604   UROBILINOGEN 0.2 12/26/2011 1110   NITRITE Postive 04/19/2017 1604   NITRITE POSITIVE 12/26/2011 1110   LEUKOCYTESUR Moderate (2+) (A) 04/19/2017 1604    Radiological Exams on Admission: DG Chest Port 1 View  Result Date: 03/08/2020 CLINICAL DATA:  PT BIBA from home with cough, fever, chills, and SOB. Pt tested positive for COVID 2 days ago. Pt is fully vaccinated  with booster. EXAM: PORTABLE CHEST 1 VIEW COMPARISON:  05/28/2019 FINDINGS: Loop recorder noted. Atherosclerotic calcification of the aortic arch. Mild enlargement of  the cardiopericardial silhouette. Hazy opacities in the lungs most notably at the left lung base and in the right mid lung are observed. Chronic accentuated lung interstitium. Right breast implant noted contributing to the density along the right hemithorax. Mild rightward tracheal deviation likely due to known left eccentric goiter. IMPRESSION: 1. Hazy opacities in the lungs, left greater than right, favoring indistinct multilobar pneumonia and possibly a manifestation of COVID pneumonia. 2. Mild enlargement of the cardiopericardial silhouette. 3. Chronic accentuated lung interstitium. Electronically Signed   By: Van Clines M.D.   On: 03/08/2020 12:00    EKG: Independently reviewed.  Chronic A. fib  Assessment/Plan Active Problems:   COVID-19 virus infection   COVID-19  (please populate well all problems here in Problem List. (For example, if patient is on BP meds at home and you resume or decide to hold them, it is a problem that needs to be her. Same for CAD, COPD, HLD and so on)  COVID PNA with impending hypoxic respiratory failure -She has a new cough and positive infiltrates on chest x-ray, given overall worsening of her symptoms and impending hypoxia, will start remdesivir, steroid and breathing treatment. -Encourage proning positioin  Acute on chronic diastolic CHF decompensation -Subtle signs of fluid overload on physical exam and >4 weeks of worsening of exertional dyspnea. -Switch p.o. Lasix to IV Lasix 20 mg daily -Check BNP and echo  Leukopenia and neutropenia -Compatible with acute viral infection, there is no significant lymphocyte, monocyte abnormalities. -Treat Covid and then reevaluate  Thrombocytopenia -Suspect this is all related to Covid infection.  Chronic a-fib -Rate controlled, continue beta  blocker and Elquis  HTN -Continue amlodipine  BPV -On Loop recorder which patient turned it off herself -Has been following with her physical therapist for Epley maneuvers, seems to help.  DVT prophylaxis: Lovenox Code Status: Full Code Family Communication: Daughter over phone Disposition Plan: Expect more than 2 midnight hospital stay to treat COVID and CHF Consults called: None Admission status: Tele admit   Lequita Halt MD [facility] Pager [phone]  03/08/2020, 1:51 PM

## 2020-03-09 ENCOUNTER — Inpatient Hospital Stay (HOSPITAL_COMMUNITY): Payer: Medicare Other

## 2020-03-09 DIAGNOSIS — I5033 Acute on chronic diastolic (congestive) heart failure: Secondary | ICD-10-CM | POA: Diagnosis not present

## 2020-03-09 DIAGNOSIS — I5031 Acute diastolic (congestive) heart failure: Secondary | ICD-10-CM

## 2020-03-09 DIAGNOSIS — U071 COVID-19: Secondary | ICD-10-CM | POA: Diagnosis not present

## 2020-03-09 LAB — CBC WITH DIFFERENTIAL/PLATELET
Abs Immature Granulocytes: 0 10*3/uL (ref 0.00–0.07)
Abs Immature Granulocytes: 0 10*3/uL (ref 0.00–0.07)
Basophils Absolute: 0 10*3/uL (ref 0.0–0.1)
Basophils Absolute: 0 10*3/uL (ref 0.0–0.1)
Basophils Relative: 2 %
Basophils Relative: 1 %
Eosinophils Absolute: 0.1 10*3/uL (ref 0.0–0.5)
Eosinophils Absolute: 0 10*3/uL (ref 0.0–0.5)
Eosinophils Relative: 4 %
Eosinophils Relative: 0 %
HCT: 40.7 % (ref 36.0–46.0)
HCT: 45.1 % (ref 36.0–46.0)
Hemoglobin: 14 g/dL (ref 12.0–15.0)
Hemoglobin: 15.9 g/dL — ABNORMAL HIGH (ref 12.0–15.0)
Immature Granulocytes: 0 %
Immature Granulocytes: 0 %
Lymphocytes Relative: 35 %
Lymphocytes Relative: 49 %
Lymphs Abs: 0.7 10*3/uL (ref 0.7–4.0)
Lymphs Abs: 0.7 10*3/uL (ref 0.7–4.0)
MCH: 32.1 pg (ref 26.0–34.0)
MCH: 32.1 pg (ref 26.0–34.0)
MCHC: 34.4 g/dL (ref 30.0–36.0)
MCHC: 35.3 g/dL (ref 30.0–36.0)
MCV: 93.3 fL (ref 80.0–100.0)
MCV: 91.1 fL (ref 80.0–100.0)
Monocytes Absolute: 0.7 10*3/uL (ref 0.1–1.0)
Monocytes Absolute: 0.2 10*3/uL (ref 0.1–1.0)
Monocytes Relative: 36 %
Monocytes Relative: 16 %
Neutro Abs: 0.4 10*3/uL — CL (ref 1.7–7.7)
Neutro Abs: 0.5 10*3/uL — ABNORMAL LOW (ref 1.7–7.7)
Neutrophils Relative %: 23 %
Neutrophils Relative %: 34 %
Platelets: 114 10*3/uL — ABNORMAL LOW (ref 150–400)
Platelets: 131 10*3/uL — ABNORMAL LOW (ref 150–400)
RBC: 4.36 MIL/uL (ref 3.87–5.11)
RBC: 4.95 MIL/uL (ref 3.87–5.11)
RDW: 13.6 % (ref 11.5–15.5)
RDW: 13.4 % (ref 11.5–15.5)
Smear Review: ADEQUATE
WBC: 1.9 10*3/uL — ABNORMAL LOW (ref 4.0–10.5)
WBC: 1.5 10*3/uL — ABNORMAL LOW (ref 4.0–10.5)
nRBC: 0 % (ref 0.0–0.2)
nRBC: 0 % (ref 0.0–0.2)

## 2020-03-09 LAB — ECHOCARDIOGRAM LIMITED
AR max vel: 2.11 cm2
AV Area VTI: 2.04 cm2
AV Area mean vel: 2 cm2
AV Mean grad: 3 mmHg
AV Peak grad: 4.9 mmHg
Ao pk vel: 1.11 m/s
Area-P 1/2: 2.46 cm2
Height: 66.25 in
S' Lateral: 3.3 cm
Weight: 3160.51 oz

## 2020-03-09 LAB — COMPREHENSIVE METABOLIC PANEL
ALT: 24 U/L (ref 0–44)
AST: 38 U/L (ref 15–41)
Albumin: 3.5 g/dL (ref 3.5–5.0)
Alkaline Phosphatase: 90 U/L (ref 38–126)
Anion gap: 12 (ref 5–15)
BUN: 13 mg/dL (ref 8–23)
CO2: 23 mmol/L (ref 22–32)
Calcium: 8.9 mg/dL (ref 8.9–10.3)
Chloride: 105 mmol/L (ref 98–111)
Creatinine, Ser: 0.66 mg/dL (ref 0.44–1.00)
GFR, Estimated: >60 mL/min (ref >60)
Glucose, Bld: 215 mg/dL — ABNORMAL HIGH (ref 70–99)
Potassium: 3.6 mmol/L (ref 3.5–5.1)
Sodium: 140 mmol/L (ref 135–145)
Total Bilirubin: 0.8 mg/dL (ref 0.3–1.2)
Total Protein: 6.1 g/dL — ABNORMAL LOW (ref 6.5–8.1)

## 2020-03-09 LAB — PHOSPHORUS: Phosphorus: 3.9 mg/dL (ref 2.5–4.6)

## 2020-03-09 LAB — FERRITIN: Ferritin: 179 ng/mL (ref 11–307)

## 2020-03-09 LAB — D-DIMER, QUANTITATIVE: D-Dimer, Quant: 0.66 ug/mL-FEU — ABNORMAL HIGH (ref 0.00–0.50)

## 2020-03-09 LAB — C-REACTIVE PROTEIN: CRP: 3.7 mg/dL — ABNORMAL HIGH (ref <1.0)

## 2020-03-09 LAB — MAGNESIUM: Magnesium: 2 mg/dL (ref 1.7–2.4)

## 2020-03-09 LAB — PATHOLOGIST SMEAR REVIEW

## 2020-03-09 MED ORDER — FUROSEMIDE 10 MG/ML IJ SOLN
20.0000 mg | Freq: Two times a day (BID) | INTRAMUSCULAR | Status: DC
  Administered 2020-03-09 – 2020-03-10 (×2): 20 mg via INTRAVENOUS
  Filled 2020-03-09 (×2): qty 2

## 2020-03-09 NOTE — Progress Notes (Signed)
  Echocardiogram 2D Echocardiogram has been performed.  Selena [name] 03/09/2020, 10:58 AM

## 2020-03-09 NOTE — Progress Notes (Signed)
   03/08/20 2030  Vitals  Pulse Rate Source Monitor  ECG Heart Rate 79  Resp 16  Level of Consciousness  Level of Consciousness Alert  MEWS COLOR  MEWS Score Color Green  Oxygen Therapy  SpO2 97 %  O2 Device Nasal Cannula  O2 Flow Rate (L/min) 3 L/min  Patient Activity (if Appropriate) In bed  Pulse Oximetry Type Continuous  Pain Assessment  Pain Scale 0-10  Pain Score 7  Pain Type Acute pain;Chronic pain  Pain Location Chest  Pain Orientation Medial;Mid  Pain Descriptors / Indicators Tightness (Patient c/o "fluttering" in chest.)  Pain Frequency Constant  Pain Onset On-going  Patients Stated Pain Goal 0  Pain Intervention(s) Emotional support  POSS Scale ([place] Opioid Sedation Scale)  POSS *See Group Information* 1-Acceptable,Awake and alert  PCA/Epidural/Spinal Assessment  Respiratory Pattern Regular;Unlabored;Dyspnea with exertion  ECG Monitoring  Telemetry Box Number [id number]  Tele Box Verification Completed by Second Verifier Completed  Glasgow Coma Scale  Eye Opening 4  Best Verbal Response (NON-intubated) 5  Best Motor Response 6  Glasgow Coma Scale Score 15  MEWS Score  MEWS Temp 0  MEWS Systolic 0  MEWS Pulse 0  MEWS RR 0  MEWS LOC 0  MEWS Score 0   Patient states that she gets this feeling at home, and takes Metoprolol for this with relief. Patient denies SOB, dizziness, and numbness/tingling. BP 124/65. HR irregular, 70s-80s, Rhythm A-Fib, which is noted in patient history. SpO2 98% on O2 3L [place]. Patient slightly anxious, but no distress noted. Metoprolol HS dose given early for this reason. After approximately 15 minutes, patient stated pain level had decreased to 4/10. Patient now denies chest pain at this time. Will continue to monitor.

## 2020-03-09 NOTE — Progress Notes (Signed)
PROGRESS NOTE                                                                             PROGRESS NOTE                                                                                                                                                                                                             Patient Demographics:    Selena [name], is a 85 y.o. female, DOB - [birth year], OLM:[id number]  Outpatient Primary MD for the patient is Caren Macadam, MD    LOS - 1  Admit date - 03/08/2020    Chief Complaint  Patient presents with  . Covid/SOB       Brief Narrative   Selena [name] is a 85 y.o. female with medical history significant of chronic diastolic CHF, HTN, A. fib on Eliquis, benign positional vertigo, remote history of non-Hodgkin's lymphoma, presented with new onset of cough fever and increasing shortness of breath.  Her symptoms started 2 days ago, with initial symptoms of runny nose sore throat and then muscle ache low-grade fever with T-max 99s, dry cough and increasing shortness of breath.  Did a home test for COVID and turned positive on Friday.  Symptoms worsen overnight yesterday.  EMS arrived found patient O2 saturation 90% on room air but stabilized with 2 L.  Patient lives by herself and over the last month, increasingly she started to feel more shortness of breath, she used to able to walk in her yards 5 rounds but reduced to 2 rounds due to shortness of breath.  She denied any increasing of leg swelling, no wheezing.  ED Course: Chest x-ray showed acute on chronic infiltrates bilaterally and multifocal infiltrates compatible with COVID-19 pneumonia.  WBC 1.9, neutrophil 0.4.    Subjective:    Selena [name] today she is feeling better, she does report some congestion and cough, denies any fever, chills but her shortness of breath much improved, thank you.   Assessment  & Plan :  Active Problems:   Acute on  chronic diastolic congestive heart failure ([place])   COVID-19 virus infection   Pneumonia due to COVID-19 virus    COVID PNA  -Patient is vaccinated and boosted . -She has a new cough and positive infiltrates on chest x-ray,  she did have some oxygen requirement initially, . -On IV steroids, low threshold to discontinue, continue with IV remdesivir, likely will need 3 days .  Acute on chronic diastolic CHF decompensation -Subtle signs of fluid overload on physical exam and >4 weeks of worsening of exertional dyspnea. -Continue with IV Lasix, I will increase to 20 mg IV twice daily -2D echo is done, results are pending, BNP elevated at 158  Leukopenia and neutropenia -Compatible with acute viral infection, there is no significant lymphocyte, monocyte abnormalities. -Treat Covid and then reevaluate  Thrombocytopenia -Suspect this is all related to Covid infection.  Chronic a-fib -Rate controlled, continue beta blocker and Elquis  HTN -Continue amlodipine  BPV -On Loop recorder which patient turned it off herself -Has been following with her physical therapist for Epley maneuvers, seems to help.      SpO2: 99 % O2 Flow Rate (L/min): 1 L/min  Recent Labs  Lab 03/08/20 1141 03/08/20 1410 03/09/20 0436  WBC 1.9*  --  1.5*  PLT 114*  --  131*  CRP 3.5*  --  3.7*  BNP 158.0*  --   --   DDIMER 0.79*  --  0.66*  PROCALCITON <0.10  --   --   AST 32  --  38  ALT 21  --  24  ALKPHOS 83  --  90  BILITOT 0.7  --  0.8  ALBUMIN 3.5  --  3.5  LATICACIDVEN 1.6 1.2  --        ABG     Component Value Date/Time   TCO2 28 05/06/2019 1338          Condition - Extremely Guarded  Family Communication  :  D/W son by phone  Code Status :  Full  Consults  :  none  Disposition Plan  :    Status is: Inpatient  Remains inpatient appropriate because:IV treatments appropriate due to intensity of illness or inability to take PO   Dispo: The patient is from:  Home              Anticipated d/c is to: Home              Anticipated d/c date is: 1 day              Patient currently is not medically stable to d/c.  He was needing some IV Lasix, and 3 days of remdesivir.   Difficult to place patient No      DVT Prophylaxis  : Eliquis   Lab Results  Component Value Date   PLT 131 (L) 03/09/2020    Diet :  Diet Order            Diet Heart Room service appropriate? Yes; Fluid consistency: Thin; Fluid restriction: 2000 mL Fluid  Diet effective now                  Inpatient Medications  Scheduled Meds: . amLODipine  5 mg Oral Daily  . apixaban  5 mg Oral BID  . furosemide  20 mg Intravenous Daily  . ipratropium  2 puff Inhalation Q6H  . methylPREDNISolone (SOLU-MEDROL) injection  0.5 mg/kg Intravenous Q12H   Followed by  . [  START ON 03/11/2020] predniSONE  50 mg Oral Daily  . metoprolol tartrate  25 mg Oral TID  . montelukast  10 mg Oral QHS  . multivitamin  1 tablet Oral Daily  . pantoprazole  40 mg Oral BID  . sodium chloride flush  3 mL Intravenous Q12H   Continuous Infusions: . sodium chloride    . remdesivir 100 mg in NS 100 mL 100 mg (03/09/20 0912)   PRN Meds:.sodium chloride, acetaminophen, guaiFENesin-dextromethorphan, ondansetron (ZOFRAN) IV, sodium chloride flush  Antibiotics  :    Anti-infectives (From admission, onward)   Start     Dose/Rate Route Frequency Ordered Stop   03/09/20 1000  remdesivir 100 mg in sodium chloride 0.9 % 100 mL IVPB       "Followed by" Linked Group Details   100 mg 200 mL/hr over 30 Minutes Intravenous Daily 03/08/20 1333 03/13/20 0959   03/08/20 1345  remdesivir 200 mg in sodium chloride 0.9% 250 mL IVPB       "Followed by" Linked Group Details   200 mg 580 mL/hr over 30 Minutes Intravenous Once 03/08/20 1333 03/08/20 1711         Kaitlyn Skowron M.D on 03/09/2020 at 11:22 AM  To page go to [email or web address]  [facility] -  Office  [phone]       Objective:    Vitals:   03/09/20 0429 03/09/20 0451 03/09/20 0500 03/09/20 0846  BP: 136/88   (!) 140/91  Pulse: 82 78 82   Resp: (!) 22 14 19    Temp: 97.8 F (36.6 C)     TempSrc: Oral     SpO2: 100% 100% 99%   Weight:   89.6 kg   Height:        Wt Readings from Last 3 Encounters:  03/09/20 89.6 kg  02/19/20 89.9 kg  12/25/19 89.8 kg     Intake/Output Summary (Last 24 hours) at 03/09/2020 1122 Last data filed at 03/09/2020 1058 Gross per 24 hour  Intake 730 ml  Output 2850 ml  Net -2120 ml     Physical Exam  Awake Alert, No new F.N deficits, Normal affect Symmetrical Chest wall movement, Good air movement bilaterally, CTAB RRR,No Gallops,Rubs or new Murmurs, No Parasternal Heave +ve B.Sounds, Abd Soft, No tenderness, No rebound - guarding or rigidity. No Cyanosis, Clubbing or edema, No new Rash or bruise      Data Review:    CBC Recent Labs  Lab 03/08/20 1141 03/09/20 0436  WBC 1.9* 1.5*  HGB 14.0 15.9*  HCT 40.7 45.1  PLT 114* 131*  MCV 93.3 91.1  MCH 32.1 32.1  MCHC 34.4 35.3  RDW 13.6 13.4  LYMPHSABS 0.7 0.7  MONOABS 0.7 0.2  EOSABS 0.1 0.0  BASOSABS 0.0 0.0    Recent Labs  Lab 03/08/20 1141 03/08/20 1410 03/09/20 0436  NA 137  --  140  K 3.6  --  3.6  CL 103  --  105  CO2 21*  --  23  GLUCOSE 110*  --  215*  BUN 11  --  13  CREATININE 0.68  --  0.66  CALCIUM 8.3*  --  8.9  AST 32  --  38  ALT 21  --  24  ALKPHOS 83  --  90  BILITOT 0.7  --  0.8  ALBUMIN 3.5  --  3.5  MG  --   --  2.0  CRP 3.5*  --  3.7*  DDIMER 0.79*  --  0.66*  PROCALCITON <0.10  --   --   LATICACIDVEN 1.6 1.2  --   BNP 158.0*  --   --     ------------------------------------------------------------------------------------------------------------------ Recent Labs    03/08/20 1141  TRIG 93    No results found for: HGBA1C ------------------------------------------------------------------------------------------------------------------ No results for input(s):  TSH, T4TOTAL, T3FREE, THYROIDAB in the last 72 hours.  Invalid input(s): FREET3  Cardiac Enzymes No results for input(s): CKMB, TROPONINI, MYOGLOBIN in the last 168 hours.  Invalid input(s): CK ------------------------------------------------------------------------------------------------------------------    Component Value Date/Time   BNP 158.0 (H) 03/08/2020 1141    Micro Results No results found for this or any previous visit (from the past 240 hour(s)).  Radiology Reports DG Chest Port 1 View  Result Date: 03/09/2020 CLINICAL DATA:  Shortness of breath.  COVID pneumonia. EXAM: PORTABLE CHEST 1 VIEW COMPARISON:  03/08/2020 FINDINGS: Cardiac enlargement is stable. There is a loop recorder identified in the projection of the left heart border. No signs of pleural effusion or edema. Chronic interstitial coarsening is identified bilaterally. No superimposed airspace consolidation. IMPRESSION: 1. Stable cardiac enlargement. 2. Chronic interstitial coarsening. Electronically Signed   By: Kerby Moors M.D.   On: 03/09/2020 08:50   DG Chest Port 1 View  Result Date: 03/08/2020 CLINICAL DATA:  PT BIBA from home with cough, fever, chills, and SOB. Pt tested positive for COVID 2 days ago. Pt is fully vaccinated with booster. EXAM: PORTABLE CHEST 1 VIEW COMPARISON:  05/28/2019 FINDINGS: Loop recorder noted. Atherosclerotic calcification of the aortic arch. Mild enlargement of the cardiopericardial silhouette. Hazy opacities in the lungs most notably at the left lung base and in the right mid lung are observed. Chronic accentuated lung interstitium. Right breast implant noted contributing to the density along the right hemithorax. Mild rightward tracheal deviation likely due to known left eccentric goiter. IMPRESSION: 1. Hazy opacities in the lungs, left greater than right, favoring indistinct multilobar pneumonia and possibly a manifestation of COVID pneumonia. 2. Mild enlargement of the  cardiopericardial silhouette. 3. Chronic accentuated lung interstitium. Electronically Signed   By: Van Clines M.D.   On: 03/08/2020 12:00

## 2020-03-09 NOTE — Evaluation (Signed)
Physical Therapy Evaluation & Discharge Patient Details Name: Selena [name] MRN: [id number] DOB: 09-25-1935 Today's Date: 03/09/2020   History of Present Illness  Pt is an 85 y.o. female admitted 03/08/20 with cough, fever and SOB; workup for acute hypoxic respiratory failure due to COVID-19 PNA, CHF exacerbation. PMH includes CHF, afib (on eliquis), HTN, OA, memory loss, BPPV.    Clinical Impression  Patient evaluated by Physical Therapy with no further acute PT needs identified. PTA, pt lives alone, mod indep with SPC, drives; pt's children live next door and can assist if needed. Today, pt able to perform transfers, ambulation and ADL tasks mod indep with SPC. Educ re: activity recommendations, energy conservation strategies and importance of mobility (recommend more frequent hallway ambulation while admitted with assist from nursing staff for line management; NT/RN aware). All education has been completed and the patient has no further questions. Acute PT is signing off. Thank you for this referral.    Follow Up Recommendations No PT follow up;Supervision - Intermittent    Equipment Recommendations  None recommended by PT    Recommendations for Other Services       Precautions / Restrictions Precautions Precautions: Fall;Other (comment) Precaution Comments: H/o BPPV (last tx by vestibular PT 01/2020) Restrictions Weight Bearing Restrictions: No      Mobility  Bed Mobility Overal bed mobility: Independent                  Transfers Overall transfer level: Modified independent Equipment used: Straight cane             General transfer comment: Able to stand from EOB, low toilet height and recliner mod indep with SPC  Ambulation/Gait Ambulation/Gait assistance: Modified independent (Device/Increase time) Gait Distance (Feet): 440 Feet Assistive device: Straight cane Gait Pattern/deviations: Step-through pattern;Decreased stride length Gait velocity:  Decreased   General Gait Details: Slow, steady gait mod indep with SPC; no overt instability or LOB noted with head turns, turning, stops, direction changes; pt only requiring assist to manage lines  Stairs            Wheelchair Mobility    Modified Rankin (Stroke Patients Only)       Balance Overall balance assessment: No apparent balance deficits (not formally assessed)   Sitting balance-Leahy Scale: Good       Standing balance-Leahy Scale: Fair Standing balance comment: Able to perform standing pericare and walk without UE support                             Pertinent Vitals/Pain Pain Assessment: No/denies pain    Home Living Family/patient expects to be discharged to:: Private residence Living Arrangements: Alone Available Help at Discharge: Family;Available 24 hours/day Type of Home: House Home Access: Stairs to enter   [employer] of Steps: 1 Home Layout: One level Home Equipment: Cane - single point;Walker - 2 wheels Additional Comments: Son and daughter live on either side of pt and can assist if needed    Prior Function Level of Independence: Independent with assistive device(s)         Comments: Mod indep with SPC due to h/o vertigo. Drives. Mod indep with household tasks (reports some difficulty sweeping)     Hand Dominance        Extremity/Trunk Assessment   Upper Extremity Assessment Upper Extremity Assessment: Overall WFL for tasks assessed    Lower Extremity Assessment Lower Extremity Assessment: Overall WFL for tasks assessed  Cervical / Trunk Assessment Cervical / Trunk Assessment: Normal  Communication   Communication: HOH  Cognition Arousal/Alertness: Awake/alert Behavior During Therapy: WFL for tasks assessed/performed Overall Cognitive Status: Within Functional Limits for tasks assessed                                        General Comments General comments (skin integrity,  edema, etc.): HR up to 140 briefly with mobility, SPO2 95% on RA; DOE 3/4 with ambulation (pt talking and wearing face mask)    Exercises     Assessment/Plan    PT Assessment Patent does not need any further PT services  PT Problem List         PT Treatment Interventions      PT Goals (Current goals can be found in the Care Plan section)  Acute Rehab PT Goals PT Goal Formulation: All assessment and education complete, DC therapy    Frequency     Barriers to discharge        Co-evaluation               AM-PAC PT "6 Clicks" Mobility  Outcome Measure Help needed turning from your back to your side while in a flat bed without using bedrails?: None Help needed moving from lying on your back to sitting on the side of a flat bed without using bedrails?: None Help needed moving to and from a bed to a chair (including a wheelchair)?: None Help needed standing up from a chair using your arms (e.g., wheelchair or bedside chair)?: None Help needed to walk in hospital room?: None Help needed climbing 3-5 steps with a railing? : A Little 6 Click Score: 23    End of Session Equipment Utilized During Treatment: Gait belt Activity Tolerance: Patient tolerated treatment well Patient left: in chair;with call bell/phone within reach Nurse Communication: Mobility status PT Visit Diagnosis: Other abnormalities of gait and mobility (R26.89)    Time: 8466-5993 PT Time Calculation (min) (ACUTE ONLY): 26 min   Charges:   PT Evaluation $PT Eval Low Complexity: 1 Low PT Treatments $Gait Training: 8-22 mins      Mabeline Caras, PT, DPT Acute Rehabilitation Services  Pager [phone] Office [place] 03/09/2020, 10:34 AM

## 2020-03-10 ENCOUNTER — Telehealth: Payer: Self-pay | Admitting: Family Medicine

## 2020-03-10 DIAGNOSIS — I5033 Acute on chronic diastolic (congestive) heart failure: Secondary | ICD-10-CM | POA: Diagnosis not present

## 2020-03-10 DIAGNOSIS — U071 COVID-19: Secondary | ICD-10-CM | POA: Diagnosis not present

## 2020-03-10 LAB — CBC WITH DIFFERENTIAL/PLATELET
Abs Immature Granulocytes: 0.01 10*3/uL (ref 0.00–0.07)
Basophils Absolute: 0 10*3/uL (ref 0.0–0.1)
Basophils Relative: 0 %
Eosinophils Absolute: 0 10*3/uL (ref 0.0–0.5)
Eosinophils Relative: 0 %
HCT: 44.2 % (ref 36.0–46.0)
Hemoglobin: 15 g/dL (ref 12.0–15.0)
Immature Granulocytes: 0 %
Lymphocytes Relative: 27 %
Lymphs Abs: 0.8 10*3/uL (ref 0.7–4.0)
MCH: 31.3 pg (ref 26.0–34.0)
MCHC: 33.9 g/dL (ref 30.0–36.0)
MCV: 92.1 fL (ref 80.0–100.0)
Monocytes Absolute: 0.8 10*3/uL (ref 0.1–1.0)
Monocytes Relative: 27 %
Neutro Abs: 1.4 10*3/uL — ABNORMAL LOW (ref 1.7–7.7)
Neutrophils Relative %: 46 %
Platelets: 131 10*3/uL — ABNORMAL LOW (ref 150–400)
RBC: 4.8 MIL/uL (ref 3.87–5.11)
RDW: 13.3 % (ref 11.5–15.5)
WBC: 3.1 10*3/uL — ABNORMAL LOW (ref 4.0–10.5)
nRBC: 0 % (ref 0.0–0.2)

## 2020-03-10 LAB — COMPREHENSIVE METABOLIC PANEL
ALT: 24 U/L (ref 0–44)
AST: 31 U/L (ref 15–41)
Albumin: 3.3 g/dL — ABNORMAL LOW (ref 3.5–5.0)
Alkaline Phosphatase: 75 U/L (ref 38–126)
Anion gap: 12 (ref 5–15)
BUN: 20 mg/dL (ref 8–23)
CO2: 24 mmol/L (ref 22–32)
Calcium: 8.7 mg/dL — ABNORMAL LOW (ref 8.9–10.3)
Chloride: 105 mmol/L (ref 98–111)
Creatinine, Ser: 0.81 mg/dL (ref 0.44–1.00)
GFR, Estimated: >60 mL/min (ref >60)
Glucose, Bld: 137 mg/dL — ABNORMAL HIGH (ref 70–99)
Potassium: 3.5 mmol/L (ref 3.5–5.1)
Sodium: 141 mmol/L (ref 135–145)
Total Bilirubin: 0.7 mg/dL (ref 0.3–1.2)
Total Protein: 5.8 g/dL — ABNORMAL LOW (ref 6.5–8.1)

## 2020-03-10 LAB — FERRITIN: Ferritin: 228 ng/mL (ref 11–307)

## 2020-03-10 LAB — MAGNESIUM: Magnesium: 2 mg/dL (ref 1.7–2.4)

## 2020-03-10 LAB — PHOSPHORUS: Phosphorus: 3.8 mg/dL (ref 2.5–4.6)

## 2020-03-10 LAB — D-DIMER, QUANTITATIVE: D-Dimer, Quant: 0.59 ug/mL-FEU — ABNORMAL HIGH (ref 0.00–0.50)

## 2020-03-10 LAB — C-REACTIVE PROTEIN: CRP: 1.7 mg/dL — ABNORMAL HIGH (ref <1.0)

## 2020-03-10 MED ORDER — POTASSIUM CHLORIDE CRYS ER 20 MEQ PO TBCR
40.0000 meq | EXTENDED_RELEASE_TABLET | Freq: Once | ORAL | Status: AC
  Administered 2020-03-10: 40 meq via ORAL
  Filled 2020-03-10: qty 2

## 2020-03-10 NOTE — Progress Notes (Signed)
[name] to be D/C'd Home per MD order.  Discussed with the patient and all questions fully answered.  VSS, Skin clean, dry and intact without evidence of skin break down, no evidence of skin tears noted. IV catheter discontinued intact. Site without signs and symptoms of complications. Dressing and pressure applied.  An After Visit Summary was printed and given to the patient. Patient received prescription.  D/c education completed with patient including follow up instructions, medication list, d/c activities limitations if indicated, with other d/c instructions as indicated by MD - patient able to verbalize understanding, all questions fully answered.   Patient instructed to return to ED, call 911, or call MD for any changes in condition.   Patient escorted via [place], and D/C home via private auto.  Jeanella Craze 03/10/2020 12:13 PM

## 2020-03-10 NOTE — Discharge Summary (Signed)
Selena [name], is a 85 y.o. female  DOB 09/10/1935  MRN [id number].  Admission date:  03/08/2020  Admitting Physician  Lequita Halt, MD  Discharge Date:  03/10/2020   Primary MD  Caren Macadam, MD  Recommendations for primary care physician for things to follow:  -Please check CBC, CMP during next visit, adjust Lasix dose as needed, but she does appear to be a euvolemic on discharge.   Admission Diagnosis  CHF (congestive heart failure) ([place]) [I50.9] Pneumonia due to COVID-19 virus [U07.1, J12.82] COVID-19 [U07.1]   Discharge Diagnosis  CHF (congestive heart failure) ([place]) [I50.9] Pneumonia due to COVID-19 virus [U07.1, J12.82] COVID-19 [U07.1]    Active Problems:   Acute on chronic diastolic congestive heart failure ([place])   COVID-19 virus infection   Pneumonia due to COVID-19 virus      Past Medical History:  Diagnosis Date   Anemia    PMH   Aneurysm (arteriovenous) of coronary vessels    Atrial fibrillation ([place]) 01/01/2007   BACK PAIN, UPPER 07/09/2008   Breast cancer, stage 1 ([place])    Bruises easily    Cataract    history   Complication of anesthesia    Diverticulitis    DIVERTICULOSIS, COLON 12/26/2006   Essential hypertension 62/09/4763   Follicular non-Hodgkin's lymphoma ([place])    Goiter    multi-nodular   Hx of colonoscopy 2009   LIVER FUNCTION TESTS, ABNORMAL, HX OF 12/26/2007   Memory loss 05/16/2007   OSTEOARTHRITIS 12/26/2006   takes Diclofenac daily but has stopped for surgery   PONV (postoperative nausea and vomiting)    Shortness of breath 05/28/2019   Skin cancer    VERTIGO 09/07/2009   Wears dentures    Wears glasses    Wears hearing aid    bilateral    Past Surgical History:  Procedure Laterality Date   ABDOMINAL HYSTERECTOMY     ABLATION OF DYSRHYTHMIC FOCUS  2009   APPENDECTOMY     AUGMENTATION MAMMAPLASTY Right 2013    BIOPSY  05/07/2019   Procedure: BIOPSY;  Surgeon: Irving Copas., MD;  Location: [facility];  Service: Gastroenterology;;   BREAST BIOPSY  2013   BREAST RECONSTRUCTION  04/14/2011   Procedure: BREAST RECONSTRUCTION;  Surgeon: Crissie Reese, MD;  Location: [facility];  Service: Plastics;  Laterality: Right;  Placement of Right Breast Tissue Expander with  use of Flex HD for Breast Reconstruction   BREAST SURGERY     right sm, snbx   CATARACT EXTRACTION W/ INTRAOCULAR LENS  IMPLANT, BILATERAL  2010   bilateral   COLONOSCOPY     COLONOSCOPY     ESOPHAGOGASTRODUODENOSCOPY (EGD) WITH PROPOFOL N/A 05/07/2019   Procedure: ESOPHAGOGASTRODUODENOSCOPY (EGD) WITH PROPOFOL;  Surgeon: Irving Copas., MD;  Location: [facility];  Service: Gastroenterology;  Laterality: N/A;   KNEE SURGERY  2004/2010   arthroscopic/ bilateral knee replacements   LOOP RECORDER IMPLANT     MASTECTOMY Right 2013   PORT-A-CATH REMOVAL N/A 12/07/2016   Procedure: REMOVAL PORT-A-CATH;  Surgeon: Rolm Bookbinder, MD;  Location: [facility];  Service: General;  Laterality: N/A;   PORTACATH PLACEMENT Right 08/11/2014   Procedure: INSERTION PORT-A-CATH WITH ULTRASOUND;  Surgeon: Rolm Bookbinder, MD;  Location: [facility];  Service: General;  Laterality: Right;   [place]   right   tmi  1998   TONSILLECTOMY         History of present illness and  Hospital Course:     Kindly see H&P for history of present illness and admission details, please review complete Labs, Consult reports and Test reports for all details in brief  HPI  from the history and physical done on the day of admission 03/08/2020   HPI: Selena [name] is a 85 y.o. female with medical history significant of chronic diastolic CHF, HTN, A. fib on Eliquis, benign positional vertigo, remote history of non-Hodgkin's lymphoma, presented with new onset of cough fever and increasing shortness of breath.  Her symptoms started  2 days ago, with initial symptoms of runny nose sore throat and then muscle ache low-grade fever with T-max 99s, dry cough and increasing shortness of breath.  Did a home test for COVID and turned positive on Friday.  Symptoms worsen overnight yesterday.  EMS arrived found patient O2 saturation 90% on room air but stabilized with 2 L.  Patient lives by herself and over the last month, increasingly she started to feel more shortness of breath, she used to able to walk in her yards 5 rounds but reduced to 2 rounds due to shortness of breath.  She denied any increasing of leg swelling, no wheezing.  ED Course: Chest x-ray showed acute on chronic infiltrates bilaterally and multifocal infiltrates compatible with COVID-19 pneumonia.  WBC 1.9, neutrophil 0.4.  Hospital Course   COVID PNA  -Patient is vaccinated and boosted . -She has a new cough and positive infiltrates on chest x-ray, she did have some oxygen requirement initially, she was treated with IV remdesivir x3 days per protocol, initially IV steroids, has been stopped as there was no evidence of hypoxia, she ambulated in the hallway today saturating 95% on room air, with no oxygen requirement.  Acute on chronic diastolic CHF decompensation -Subtle signs of fluid overload onphysical exam and>4weeks of worsening of exertional dyspnea.  Treated with IV Lasix during hospital stay, 2D echo with a preserved EF, no significant valvular disease, her BNP is elevated at 158, she does appear to be euvolemic at the time of discharge, however she is to resume her home dose Lasix.  Leukopeniaand neutropenia -Compatible with acute viral infection, there is nosignificant lymphocyte, monocyte abnormalities.  Thrombocytopenia -Suspect this is all related to Covid infection.  Chronic a-fib -Rate controlled, continue beta blocker and Elquis  HTN -Continue amlodipine  BPV -On Loop recorder which patient turned it off herself -Has been  following with herphysical therapist for Epley maneuvers, seems to help.  Discharge Condition:   Stable   Follow UP   Follow-up Information    Koberlein, Junell C, MD Follow up in 1 week(s).   Specialty: Family Medicine Contact information: [place] [address] [phone]                 Discharge Instructions  and  Discharge Medications    Discharge Instructions    Discharge instructions   Complete by: As directed    Follow with Primary MD Caren Macadam, MD in 7 days   Get CBC, CMP, checked  by  Primary MD next visit.    Activity: As tolerated with Full fall precautions use walker/cane & assistance as needed   Disposition Home    Diet: Heart Healthy .  For Heart failure patients - Check your Weight same time everyday, if you gain over 2 pounds, or you develop in leg swelling, experience more shortness of breath or chest pain, call your Primary MD immediately. Follow Cardiac Low Salt Diet and 1.5 lit/day fluid restriction.   On your next visit with your primary care physician please Get Medicines reviewed and adjusted.   Please request your Prim.MD to go over all Hospital Tests and Procedure/Radiological results at the follow up, please get all Hospital records sent to your Prim MD by signing hospital release before you go home.   If you experience worsening of your admission symptoms, develop shortness of breath, life threatening emergency, suicidal or homicidal thoughts you must seek medical attention immediately by calling 911 or calling your MD immediately  if symptoms less severe.  You Must read complete instructions/literature along with all the possible adverse reactions/side effects for all the Medicines you take and that have been prescribed to you. Take any new Medicines after you have completely understood and accpet all the possible adverse reactions/side effects.   Do not drive, operating heavy machinery, perform  activities at heights, swimming or participation in water activities or provide baby sitting services if your were admitted for syncope or siezures until you have seen by Primary MD or a Neurologist and advised to do so again.  Do not drive when taking Pain medications.    Do not take more than prescribed Pain, Sleep and Anxiety Medications  Special Instructions: If you have smoked or chewed Tobacco  in the last 2 yrs please stop smoking, stop any regular Alcohol  and or any Recreational drug use.  Wear Seat belts while driving.   Please note  You were cared for by a hospitalist during your hospital stay. If you have any questions about your discharge medications or the care you received while you were in the hospital after you are discharged, you can call the unit and asked to speak with the hospitalist on call if the hospitalist that took care of you is not available. Once you are discharged, your primary care physician will handle any further medical issues. Please note that NO REFILLS for any discharge medications will be authorized once you are discharged, as it is imperative that you return to your primary care physician (or establish a relationship with a primary care physician if you do not have one) for your aftercare needs so that they can reassess your need for medications and monitor your lab values.   Increase activity slowly   Complete by: As directed      Allergies as of 03/10/2020      Reactions   Codeine Phosphate Other (See Comments)   Hallucinations   Adhesive [tape]    Electrodes for EKG- skin blistering, erythema   Codeine Other (See Comments)   Hallucinations       Medication List    TAKE these medications   amLODipine 5 MG tablet Commonly known as: NORVASC TAKE 1 TABLET BY MOUTH DAILY   apixaban 5 MG Tabs tablet Commonly known as: Eliquis Take 1 tablet (5 mg total) by mouth 2 (two) times daily.   furosemide 20 MG tablet Commonly known as: LASIX Take 1  tablet (20 mg total) by mouth daily.   metoprolol tartrate 25 MG tablet  Commonly known as: LOPRESSOR Take 25 mg by mouth 3 (three) times daily. MAY TAKE ONE EXTRA TABLET FOR HEART RATE OVER 100.   montelukast 10 MG tablet Commonly known as: SINGULAIR TAKE 1 TABLET(10 MG) BY MOUTH AT BEDTIME What changed: See the new instructions.   NON FORMULARY CBD oil   pantoprazole 40 MG tablet Commonly known as: PROTONIX TAKE 1 TABLET(40 MG) BY MOUTH TWICE DAILY What changed: See the new instructions.   PRESERVISION AREDS 2 PO Take 2 capsules by mouth daily.   traZODone 50 MG tablet Commonly known as: DESYREL Take 0.5-1 tablets (25-50 mg total) by mouth at bedtime as needed for sleep.         Diet and Activity recommendation: See Discharge Instructions above   Consults obtained -  None   Major procedures and Radiology Reports - PLEASE review detailed and final reports for all details, in brief -    DG Chest Port 1 View  Result Date: 03/09/2020 CLINICAL DATA:  Shortness of breath.  COVID pneumonia. EXAM: PORTABLE CHEST 1 VIEW COMPARISON:  03/08/2020 FINDINGS: Cardiac enlargement is stable. There is a loop recorder identified in the projection of the left heart border. No signs of pleural effusion or edema. Chronic interstitial coarsening is identified bilaterally. No superimposed airspace consolidation. IMPRESSION: 1. Stable cardiac enlargement. 2. Chronic interstitial coarsening. Electronically Signed   By: Kerby Moors M.D.   On: 03/09/2020 08:50   DG Chest Port 1 View  Result Date: 03/08/2020 CLINICAL DATA:  PT BIBA from home with cough, fever, chills, and SOB. Pt tested positive for COVID 2 days ago. Pt is fully vaccinated with booster. EXAM: PORTABLE CHEST 1 VIEW COMPARISON:  05/28/2019 FINDINGS: Loop recorder noted. Atherosclerotic calcification of the aortic arch. Mild enlargement of the cardiopericardial silhouette. Hazy opacities in the lungs most notably at the left lung  base and in the right mid lung are observed. Chronic accentuated lung interstitium. Right breast implant noted contributing to the density along the right hemithorax. Mild rightward tracheal deviation likely due to known left eccentric goiter. IMPRESSION: 1. Hazy opacities in the lungs, left greater than right, favoring indistinct multilobar pneumonia and possibly a manifestation of COVID pneumonia. 2. Mild enlargement of the cardiopericardial silhouette. 3. Chronic accentuated lung interstitium. Electronically Signed   By: Van Clines M.D.   On: 03/08/2020 12:00   ECHOCARDIOGRAM LIMITED  Result Date: 03/09/2020    ECHOCARDIOGRAM LIMITED REPORT   Patient Name:   Selena [name] Date of Exam: 03/09/2020 Medical Rec #:  [id number]      Height:       66.3 in Accession #:    [id number]     Weight:       197.5 lb Date of Birth:  1935-07-07      BSA:          1.995 m Patient Age:    4 years       BP:           140/91 mmHg Patient Gender: F              HR:           77 bpm. Exam Location:  Inpatient Procedure: Limited Echo, Cardiac Doppler and Color Doppler Indications:    CHF-Acute diastolic  History:        Patient has prior history of Echocardiogram examinations, most                 recent 09/07/2017. CHF, Arrythmias:Atrial Fibrillation;  Risk                 Factors:Former Smoker and Hypertension. COVID-19. S/P ablation                 of atrial flutter.  Sonographer:    Clayton Lefort RDCS (AE) Referring Phys: [id number] Lequita Halt  Sonographer Comments: COVID-19. IMPRESSIONS  1. Left ventricular ejection fraction, by estimation, is 55%. The left ventricle has normal function. The left ventricle has no regional wall motion abnormalities. There is mild left ventricular hypertrophy. Left ventricular diastolic parameters are indeterminate.  2. Right ventricular systolic function is normal. The right ventricular size is normal. Tricuspid regurgitation signal is inadequate for assessing PA pressure.  3. Left atrial  size was moderately dilated.  4. Right atrial size was moderately dilated.  5. The mitral valve is normal in structure. Trivial mitral valve regurgitation. No evidence of mitral stenosis.  6. The aortic valve is tricuspid. Aortic valve regurgitation is not visualized. Mild aortic valve sclerosis is present, with no evidence of aortic valve stenosis.  7. The inferior vena cava is normal in size with greater than 50% respiratory variability, suggesting right atrial pressure of 3 mmHg.  8. The patient was in atrial fibrillation. FINDINGS  Left Ventricle: Left ventricular ejection fraction, by estimation, is 55%. The left ventricle has normal function. The left ventricle has no regional wall motion abnormalities. The left ventricular internal cavity size was normal in size. There is mild left ventricular hypertrophy. Left ventricular diastolic parameters are indeterminate. Right Ventricle: The right ventricular size is normal. No increase in right ventricular wall thickness. Right ventricular systolic function is normal. Tricuspid regurgitation signal is inadequate for assessing PA pressure. Left Atrium: Left atrial size was moderately dilated. Right Atrium: Right atrial size was moderately dilated. Mitral Valve: The mitral valve is normal in structure. Mild mitral annular calcification. Trivial mitral valve regurgitation. No evidence of mitral valve stenosis. Aortic Valve: The aortic valve is tricuspid. Aortic valve regurgitation is not visualized. Mild aortic valve sclerosis is present, with no evidence of aortic valve stenosis. Aortic valve mean gradient measures 3.0 mmHg. Aortic valve peak gradient measures 4.9 mmHg. Aortic valve area, by VTI measures 2.04 cm. Pulmonic Valve: The pulmonic valve was normal in structure. Pulmonic valve regurgitation is not visualized. Aorta: The aortic root is normal in size and structure. Venous: The inferior vena cava is normal in size with greater than 50% respiratory variability,  suggesting right atrial pressure of 3 mmHg. LEFT VENTRICLE PLAX 2D LVIDd:         4.50 cm  Diastology LVIDs:         3.30 cm  LV e' medial:    6.85 cm/s LV PW:         1.30 cm  LV E/e' medial:  15.6 LV IVS:        1.40 cm  LV e' lateral:   10.90 cm/s LVOT diam:     1.90 cm  LV E/e' lateral: 9.8 LV SV:         44 LV SV Index:   22 LVOT Area:     2.84 cm  IVC IVC diam: 1.00 cm LEFT ATRIUM         Index LA diam:    3.90 cm 1.95 cm/m  AORTIC VALVE AV Area (Vmax):    2.11 cm AV Area (Vmean):   2.00 cm AV Area (VTI):     2.04 cm AV Vmax:  111.00 cm/s AV Vmean:          75.300 cm/s AV VTI:            0.215 m AV Peak Grad:      4.9 mmHg AV Mean Grad:      3.0 mmHg LVOT Vmax:         82.70 cm/s LVOT Vmean:        53.100 cm/s LVOT VTI:          0.155 m LVOT/AV VTI ratio: 0.72  AORTA Ao Root diam: 3.30 cm Ao Asc diam:  3.30 cm MITRAL VALVE MV Area (PHT): 2.46 cm     SHUNTS MV Decel Time: 309 msec     Systemic VTI:  0.16 m MV E velocity: 107.00 cm/s  Systemic Diam: 1.90 cm MV A velocity: 27.90 cm/s MV E/A ratio:  3.84 Loralie Champagne MD Electronically signed by Loralie Champagne MD Signature Date/Time: 03/09/2020/2:29:21 PM    Final     Micro Results    Recent Results (from the past 240 hour(s))  Blood Culture (routine x 2)     Status: None (Preliminary result)   Collection Time: 03/08/20 11:45 AM   Specimen: BLOOD  Result Value Ref Range Status   Specimen Description BLOOD LEFT ANTECUBITAL  Final   Special Requests   Final    BOTTLES DRAWN AEROBIC AND ANAEROBIC Blood Culture adequate volume   Culture   Final    NO GROWTH 1 DAY Performed at [facility], [address] [place], [place] [address]    Report Status PENDING  Incomplete  Blood Culture (routine x 2)     Status: None (Preliminary result)   Collection Time: 03/08/20  1:54 PM   Specimen: BLOOD RIGHT HAND  Result Value Ref Range Status   Specimen Description BLOOD RIGHT HAND  Final   Special Requests   Final    BOTTLES DRAWN  AEROBIC AND ANAEROBIC Blood Culture adequate volume   Culture   Final    NO GROWTH < 24 HOURS Performed at [facility], [place] [address], [place], [place] [address]    Report Status PENDING  Incomplete       Today   Subjective:   Selena [name] today has no headache,no chest abdominal pain,no new weakness tingling or numbness, feels much better wants to go home today.   Objective:   Blood pressure 120/77, pulse 72, temperature (!) 97.5 F (36.4 C), temperature source Oral, resp. rate 19, height 5' 6.25" (1.683 m), weight 84.6 kg, SpO2 99 %.   Intake/Output Summary (Last 24 hours) at 03/10/2020 1120 Last data filed at 03/10/2020 0857 Gross per 24 hour  Intake 700 ml  Output 800 ml  Net -100 ml    Exam Awake Alert, Oriented x 3, No new F.N deficits, Normal affect Symmetrical Chest wall movement, Good air movement bilaterally, CTAB RRR,No Gallops,Rubs or new Murmurs, No Parasternal Heave +ve B.Sounds, Abd Soft, Non tender, No rebound -guarding or rigidity. No Cyanosis, Clubbing or edema, No new Rash or bruise  Data Review   CBC w Diff:  Lab Results  Component Value Date   WBC 3.1 (L) 03/10/2020   HGB 15.0 03/10/2020   HGB 16.0 (H) 12/13/2019   HGB 14.1 10/03/2018   HGB 14.5 12/06/2016   HCT 44.2 03/10/2020   HCT 42.4 10/03/2018   HCT 43.4 12/06/2016   PLT 131 (L) 03/10/2020   PLT 204 12/13/2019   PLT 194 10/03/2018  LYMPHOPCT 27 03/10/2020   LYMPHOPCT 13.1 (L) 12/06/2016   MONOPCT 27 03/10/2020   MONOPCT 13.4 12/06/2016   EOSPCT 0 03/10/2020   EOSPCT 4.0 12/06/2016   BASOPCT 0 03/10/2020   BASOPCT 1.0 12/06/2016    CMP:  Lab Results  Component Value Date   NA 141 03/10/2020   NA 145 (H) 10/10/2018   NA 143 12/06/2016   K 3.5 03/10/2020   K 4.2 12/06/2016   CL 105 03/10/2020   CL 107 04/05/2012   CO2 24 03/10/2020   CO2 27 12/06/2016   BUN 20 03/10/2020   BUN 23 10/10/2018   BUN 18.5 12/06/2016   CREATININE 0.81 03/10/2020   CREATININE  0.83 12/13/2019   CREATININE 0.80 10/22/2019   CREATININE 0.8 12/06/2016   PROT 5.8 (L) 03/10/2020   PROT 6.1 07/17/2018   PROT 6.5 12/06/2016   ALBUMIN 3.3 (L) 03/10/2020   ALBUMIN 4.3 07/17/2018   ALBUMIN 3.7 12/06/2016   BILITOT 0.7 03/10/2020   BILITOT 0.8 12/13/2019   BILITOT 0.56 12/06/2016   ALKPHOS 75 03/10/2020   ALKPHOS 110 12/06/2016   AST 31 03/10/2020   AST 20 12/13/2019   AST 41 (H) 12/06/2016   ALT 24 03/10/2020   ALT 18 12/13/2019   ALT 57 (H) 12/06/2016  .   Total Time in preparing paper work, data evaluation and todays exam - 40 minutes  Phillips Climes M.D on 03/10/2020 at 11:20 AM  [facility]   Office  [phone]

## 2020-03-10 NOTE — Discharge Instructions (Signed)

## 2020-03-10 NOTE — Progress Notes (Signed)
SATURATION QUALIFICATIONS: (This note is used to comply with regulatory documentation for home oxygen)  Patient Saturations on Room Air at Rest = 97%  Patient Saturations on Room Air while Ambulating = 94%  Patient Saturations on 0 Liters of oxygen while Ambulating = 94%  Please briefly explain why patient needs home oxygen: pt not requiring o2 at this time

## 2020-03-10 NOTE — Telephone Encounter (Signed)
Pt call and stated she just got out of the hospital with covid and they want her to have labs in 7 day the patient need a call back to tell her when she can come andl have the labs done .

## 2020-03-10 NOTE — Progress Notes (Signed)
OT Cancellation Note  Patient Details Name: Selena [name] MRN: [id number] DOB: 06-29-1935   Cancelled Treatment:    Reason Eval/Treat Not Completed: OT screened, no needs identified, will sign off. Collaborated with staff, noted no PT needs identified. Per nursing, pt able to ambulate without assist, toileting self independently. No need for formal eval, OT to sign off.   Layla Maw 03/10/2020, 10:49 AM

## 2020-03-10 NOTE — Progress Notes (Signed)
Ok to stop remdesivir at 3d per Dr [facility].  Onnie Boer, PharmD, BCIDP, AAHIVP, CPP Infectious Disease Pharmacist 03/10/2020 11:36 AM

## 2020-03-10 NOTE — Telephone Encounter (Signed)
Spoke with the pt and informed her due to office policy for hospital follow ups post-Covid, she would need to contact the Covid care clinic at [phone].  Patient agreed to call for an appt and was advised to keep the previously scheduled appt with Dr Ethlyn Gallery on 3/25.

## 2020-03-12 ENCOUNTER — Telehealth: Payer: Self-pay | Admitting: Family Medicine

## 2020-03-12 NOTE — Telephone Encounter (Signed)
Patient has a few things mentioned in her discharged paperwork.  She would like to know if the CBC and CMP that were listed need to be done before her hospital follow up.  Please advise.

## 2020-03-13 LAB — CULTURE, BLOOD (ROUTINE X 2)
Culture: NO GROWTH
Culture: NO GROWTH
Special Requests: ADEQUATE
Special Requests: ADEQUATE

## 2020-03-13 NOTE — Telephone Encounter (Signed)
Pt returned the call and would like to have a call back

## 2020-03-13 NOTE — Telephone Encounter (Signed)
Left a message for the pt to return my call.  

## 2020-03-13 NOTE — Telephone Encounter (Signed)
Patient informed of the message below.  Patient states she is feeling better, still complains of weakness and agreed to await the appt on 3/25 for labs.

## 2020-03-13 NOTE — Telephone Encounter (Signed)
No; as long as she is feeling well, we do not need to get them before that follow up. I hope she is feeling better and she should note that she is doing a little better each day.

## 2020-03-16 ENCOUNTER — Other Ambulatory Visit: Payer: Self-pay | Admitting: Family Medicine

## 2020-03-16 ENCOUNTER — Telehealth: Payer: Self-pay | Admitting: Family Medicine

## 2020-03-16 DIAGNOSIS — I10 Essential (primary) hypertension: Secondary | ICD-10-CM

## 2020-03-16 DIAGNOSIS — I5033 Acute on chronic diastolic (congestive) heart failure: Secondary | ICD-10-CM

## 2020-03-16 DIAGNOSIS — J1282 Pneumonia due to coronavirus disease 2019: Secondary | ICD-10-CM

## 2020-03-16 NOTE — Telephone Encounter (Signed)
I have to have a face to face visit with her in order to order home health (I will try to order this based on her recent time in hospital, but insurance may require another visit). Has she gotten weaker since discharge from hospital with [place]? Any new or change in symptoms? Also make sure not injured with recent fall. I am happy to add her in for virtual to talk with her on weds. We may need to do some additional evaluation if she is not improving as we would expect.

## 2020-03-16 NOTE — Telephone Encounter (Signed)
I think it would be best if she could be seen in the covid clinic for follow up. They can get a listen to her and complete some follow up bloodwork to make sure we aren't missing anything that is keeping her feeling weak. This would be my first choice for follow up. I did go ahead and send order for home health for her in the meanwhile.

## 2020-03-16 NOTE — Telephone Encounter (Signed)
Spoke with Selena [name] and informed her of the message below.  Selena [name] was given the number to contact the Covid care clinic at  [phone] and agreed to call for an appt.

## 2020-03-16 NOTE — Telephone Encounter (Signed)
Left a message for Selena [name] to return my call.

## 2020-03-16 NOTE — Telephone Encounter (Signed)
Olivia Mackie called back and was informed of the message below.  Olivia Mackie stated the pt has had a low grade fever, temperature of 100 degrees since discharge and she feels this causes weakness.  Stated the pt has vertigo, experienced dizziness this AM, fell and cut her hand.  Stated the paramedics came to her home, bandaged the hand, was not taken to the ER,  O2 sat was 94% and still has recurrent cough.  Olivia Mackie stated she is concerned as the pt lives alone and she fears her falling again.  Message sent to PCP.

## 2020-03-16 NOTE — Telephone Encounter (Signed)
Pts Daughter in law is calling in to see if the pt could get home health aid to assist in the home due to the pt had a fall this morning and is very weak from having COVID 03/07/2020 (Admitted in the hospital on 03/08/2020).

## 2020-03-18 ENCOUNTER — Telehealth (INDEPENDENT_AMBULATORY_CARE_PROVIDER_SITE_OTHER): Payer: Medicare Other | Admitting: Nurse Practitioner

## 2020-03-18 VITALS — Temp 100.0°F

## 2020-03-18 DIAGNOSIS — J1282 Pneumonia due to coronavirus disease 2019: Secondary | ICD-10-CM | POA: Diagnosis not present

## 2020-03-18 DIAGNOSIS — U071 COVID-19: Secondary | ICD-10-CM | POA: Diagnosis not present

## 2020-03-18 MED ORDER — PREDNISONE 20 MG PO TABS: 20.0000 mg | ORAL_TABLET | Freq: Every day | ORAL | 0 refills | Status: AC

## 2020-03-18 NOTE — Progress Notes (Signed)
  Virtual Visit via Telephone Note  I connected with Selena [name] on 03/18/20 at  9:00 AM EST by telephone and verified that I am speaking with the correct person using two identifiers.  Location: Patient: home Provider: office   I discussed the limitations, risks, security and privacy concerns of performing an evaluation and management service by telephone and the availability of in person appointments. I also discussed with the patient that there may be a patient responsible charge related to this service. The patient expressed understanding and agreed to proceed.   History of Present Illness:  Patient presents today for post COVID care clinic visit through televisit.  Patient was recently admitted to the hospital on 03/08/2020 for Covid pneumonia.  She states that overall she has been stable since hospital discharge.  She does continue to have some cough, low-grade fever, diarrhea.  She states that she is dizzy at times.  We discussed the importance of eating 6 small meals throughout the day to keep blood sugar stable and staying well-hydrated.  Patient is due for repeat blood work. Denies f/c/s, n/v/d, hemoptysis, PND, chest pain or edema.       Observations/Objective:  Vitals with BMI 03/10/2020 03/10/2020 03/10/2020  Height - - -  Weight - 186 lbs 8 oz -  BMI - 81.27 -  Systolic 517 - 001  Diastolic 77 - 78  Pulse - - 72      Assessment and Plan:  Covid pneumonia Cough:   Stay well hydrated  Stay active  Deep breathing exercises  May take tylenol or fever or pain  May take mucinex twice daily  Will check labs  Will order prednisone   Follow up:  Follow up in 1 week or sooner if needed - may need repeat imaging      I discussed the assessment and treatment plan with the patient. The patient was provided an opportunity to ask questions and all were answered. The patient agreed with the plan and demonstrated an understanding of the instructions.    The patient was advised to call back or seek an in-person evaluation if the symptoms worsen or if the condition fails to improve as anticipated.  I provided 22 minutes of non-face-to-face time during this encounter.   Fenton Foy, NP

## 2020-03-18 NOTE — Patient Instructions (Addendum)
Covid pneumonia Cough:   Stay well hydrated  Stay active  Deep breathing exercises  May take tylenol or fever or pain  May take mucinex twice daily  Will check labs  Will order prednisone   Follow up:  Follow up in 1 week or sooner if needed - may need repeat imaging

## 2020-03-25 ENCOUNTER — Ambulatory Visit: Payer: Medicare Other

## 2020-03-25 NOTE — Telephone Encounter (Signed)
Addie from [employer] is calling and stated that someone from home health contacted patient last week but hasn't heard back yet. CB [phone] ext [phone]

## 2020-03-26 ENCOUNTER — Encounter (HOSPITAL_COMMUNITY): Payer: Self-pay

## 2020-03-26 ENCOUNTER — Ambulatory Visit (INDEPENDENT_AMBULATORY_CARE_PROVIDER_SITE_OTHER): Payer: Medicare Other | Admitting: Nurse Practitioner

## 2020-03-26 ENCOUNTER — Other Ambulatory Visit: Payer: Self-pay

## 2020-03-26 ENCOUNTER — Emergency Department (HOSPITAL_COMMUNITY): Payer: Medicare Other

## 2020-03-26 ENCOUNTER — Emergency Department (HOSPITAL_COMMUNITY)
Admission: EM | Admit: 2020-03-26 | Discharge: 2020-03-26 | Disposition: A | Discharge status: Home / Self Care | Payer: Medicare Other | Attending: Emergency Medicine | Admitting: Emergency Medicine

## 2020-03-26 VITALS — BP 130/90 | HR 73 | Temp 98.0°F

## 2020-03-26 DIAGNOSIS — R079 Chest pain, unspecified: Secondary | ICD-10-CM | POA: Diagnosis not present

## 2020-03-26 DIAGNOSIS — I10 Essential (primary) hypertension: Secondary | ICD-10-CM | POA: Insufficient documentation

## 2020-03-26 DIAGNOSIS — R0602 Shortness of breath: Secondary | ICD-10-CM | POA: Insufficient documentation

## 2020-03-26 DIAGNOSIS — Z87891 Personal history of nicotine dependence: Secondary | ICD-10-CM | POA: Diagnosis not present

## 2020-03-26 DIAGNOSIS — U071 COVID-19: Secondary | ICD-10-CM | POA: Diagnosis not present

## 2020-03-26 DIAGNOSIS — I4891 Unspecified atrial fibrillation: Secondary | ICD-10-CM | POA: Insufficient documentation

## 2020-03-26 DIAGNOSIS — J1282 Pneumonia due to coronavirus disease 2019: Secondary | ICD-10-CM | POA: Diagnosis not present

## 2020-03-26 DIAGNOSIS — Z79899 Other long term (current) drug therapy: Secondary | ICD-10-CM | POA: Insufficient documentation

## 2020-03-26 DIAGNOSIS — Z7901 Long term (current) use of anticoagulants: Secondary | ICD-10-CM | POA: Insufficient documentation

## 2020-03-26 LAB — I-STAT CHEM 8, ED
BUN: 14 mg/dL (ref 8–23)
Calcium, Ion: 1.18 mmol/L (ref 1.15–1.40)
Chloride: 103 mmol/L (ref 98–111)
Creatinine, Ser: 0.6 mg/dL (ref 0.44–1.00)
Glucose, Bld: 87 mg/dL (ref 70–99)
HCT: 46 % (ref 36.0–46.0)
Hemoglobin: 15.6 g/dL — ABNORMAL HIGH (ref 12.0–15.0)
Potassium: 4.1 mmol/L (ref 3.5–5.1)
Sodium: 142 mmol/L (ref 135–145)
TCO2: 27 mmol/L (ref 22–32)

## 2020-03-26 LAB — CBC WITH DIFFERENTIAL/PLATELET
Abs Immature Granulocytes: 0.34 10*3/uL — ABNORMAL HIGH (ref 0.00–0.07)
Basophils Absolute: 0.1 10*3/uL (ref 0.0–0.1)
Basophils Relative: 1 %
Eosinophils Absolute: 0.4 10*3/uL (ref 0.0–0.5)
Eosinophils Relative: 5 %
HCT: 45.9 % (ref 36.0–46.0)
Hemoglobin: 14.9 g/dL (ref 12.0–15.0)
Immature Granulocytes: 4 %
Lymphocytes Relative: 20 %
Lymphs Abs: 1.6 10*3/uL (ref 0.7–4.0)
MCH: 30.7 pg (ref 26.0–34.0)
MCHC: 32.5 g/dL (ref 30.0–36.0)
MCV: 94.4 fL (ref 80.0–100.0)
Monocytes Absolute: 1.3 10*3/uL — ABNORMAL HIGH (ref 0.1–1.0)
Monocytes Relative: 15 %
Neutro Abs: 4.4 10*3/uL (ref 1.7–7.7)
Neutrophils Relative %: 55 %
Platelets: 247 10*3/uL (ref 150–400)
RBC: 4.86 MIL/uL (ref 3.87–5.11)
RDW: 13.8 % (ref 11.5–15.5)
WBC: 8.1 10*3/uL (ref 4.0–10.5)
nRBC: 0 % (ref 0.0–0.2)

## 2020-03-26 LAB — COMPREHENSIVE METABOLIC PANEL
ALT: 34 U/L (ref 0–44)
AST: 28 U/L (ref 15–41)
Albumin: 3.7 g/dL (ref 3.5–5.0)
Alkaline Phosphatase: 98 U/L (ref 38–126)
Anion gap: 10 (ref 5–15)
BUN: 15 mg/dL (ref 8–23)
CO2: 29 mmol/L (ref 22–32)
Calcium: 9.1 mg/dL (ref 8.9–10.3)
Chloride: 104 mmol/L (ref 98–111)
Creatinine, Ser: 0.67 mg/dL (ref 0.44–1.00)
GFR, Estimated: >60 mL/min (ref >60)
Glucose, Bld: 91 mg/dL (ref 70–99)
Potassium: 4.1 mmol/L (ref 3.5–5.1)
Sodium: 143 mmol/L (ref 135–145)
Total Bilirubin: 1 mg/dL (ref 0.3–1.2)
Total Protein: 6.4 g/dL — ABNORMAL LOW (ref 6.5–8.1)

## 2020-03-26 MED ORDER — ALBUTEROL SULFATE HFA 108 (90 BASE) MCG/ACT IN AERS
2.0000 | INHALATION_SPRAY | RESPIRATORY_TRACT | Status: DC | PRN
  Administered 2020-03-26: 2 via RESPIRATORY_TRACT
  Filled 2020-03-26: qty 6.7

## 2020-03-26 MED ORDER — ONDANSETRON HCL 4 MG/2ML IJ SOLN
4.0000 mg | Freq: Once | INTRAMUSCULAR | Status: AC
  Administered 2020-03-26: 4 mg via INTRAVENOUS
  Filled 2020-03-26: qty 2

## 2020-03-26 MED ORDER — IOHEXOL 350 MG/ML SOLN
100.0000 mL | Freq: Once | INTRAVENOUS | Status: AC | PRN
  Administered 2020-03-26: 82 mL via INTRAVENOUS

## 2020-03-26 MED ORDER — HYDROCODONE-ACETAMINOPHEN 5-325 MG PO TABS: 1.0000 | ORAL_TABLET | Freq: Four times a day (QID) | ORAL | 0 refills | Status: DC | PRN

## 2020-03-26 MED ORDER — FENTANYL CITRATE (PF) 100 MCG/2ML IJ SOLN
50.0000 ug | Freq: Once | INTRAMUSCULAR | Status: AC
  Administered 2020-03-26: 50 ug via INTRAVENOUS
  Filled 2020-03-26: qty 2

## 2020-03-26 MED ORDER — PREDNISONE 20 MG PO TABS: 60.0000 mg | ORAL_TABLET | Freq: Every day | ORAL | 0 refills | Status: AC

## 2020-03-26 MED ORDER — FENTANYL CITRATE (PF) 100 MCG/2ML IJ SOLN
25.0000 ug | Freq: Once | INTRAMUSCULAR | Status: AC
  Administered 2020-03-26: 25 ug via INTRAVENOUS
  Filled 2020-03-26: qty 2

## 2020-03-26 MED ORDER — HYDROCODONE-ACETAMINOPHEN 5-325 MG PO TABS
1.0000 | ORAL_TABLET | Freq: Once | ORAL | Status: AC
  Administered 2020-03-26: 1 via ORAL
  Filled 2020-03-26: qty 1

## 2020-03-26 MED ORDER — LIDOCAINE 5 % EX PTCH
1.0000 | MEDICATED_PATCH | Freq: Once | CUTANEOUS | Status: DC
  Administered 2020-03-26: 1 via TRANSDERMAL
  Filled 2020-03-26: qty 1

## 2020-03-26 NOTE — ED Triage Notes (Signed)
Patient reports that she fell a week ago and was recently discharged from the hospital with Covid. Patient states she has been having SOB. patient was instructed to come to the ED to r/o PE or broken rib.

## 2020-03-26 NOTE — ED Provider Notes (Signed)
Care assumed from [place] at shift change, please see her note for full details, but in brief Selena [name] is a 85 y.o. female who was recently hospitalized with Covid, discharged on 2/15, returns today due to right-sided chest pain and shortness of breath.  Pain seems to be reproducible with palpation, she did have a fall 3 weeks ago, shortness of breath seems to be worsened by pain.  Lab work reassuring.  Chest x-ray shows some interval progression of pulmonary interstitial fibrosis but no acute findings.  At shift change CTA is pending.  Pain has been well controlled with fentanyl and with that patient has had improvement in shortness of breath.  Plan: F/u CT, and will need ambulatory pulse ox prior to DC of scan is normal  BP 121/63   Pulse 72   Temp 98.1 F (36.7 C) (Oral)   Resp 16   Ht 5' 6.25" (1.683 m)   Wt 86.6 kg   SpO2 96%   BMI 30.60 kg/m    ED Course/Procedures   Labs Reviewed  COMPREHENSIVE METABOLIC PANEL - Abnormal; Notable for the following components:      Result Value   Total Protein 6.4 (*)    All other components within normal limits  CBC WITH DIFFERENTIAL/PLATELET - Abnormal; Notable for the following components:   Monocytes Absolute 1.3 (*)    Abs Immature Granulocytes 0.34 (*)    All other components within normal limits  I-STAT CHEM 8, ED - Abnormal; Notable for the following components:   Hemoglobin 15.6 (*)    All other components within normal limits   EKG Interpretation  Date/Time:  Thursday March 26 2020 11:42:43 EST Ventricular Rate:  67 PR Interval:    QRS Duration: 93 QT Interval:  378 QTC Calculation: 399 R Axis:   85 Text Interpretation: Atrial fibrillation Borderline right axis deviation Low voltage, precordial leads 12 Lead; [place] No significant change since last tracing Confirmed by Calvert Cantor [phone]) on 03/26/2020 4:05:21 PM   DG Chest 2 View  Result Date: 03/26/2020 CLINICAL DATA:  Shortness of breath.  Recent COVID-19 viral pneumonia. EXAM: CHEST - 2 VIEW COMPARISON:  03/09/2020 and 05/28/2019 FINDINGS: Stable mild cardiomegaly. Aortic atherosclerotic calcification noted. Cardiac loop recorder is again seen. Coarsening of interstitial lung markings is seen in the peripheral in basilar lung zones, increased since previous study, and consistent with progressive fibrosis. No evidence of pulmonary consolidation or edema. No evidence of pleural effusion. IMPRESSION: Interval progression of pulmonary interstitial fibrosis. No acute findings. Stable mild cardiomegaly. Electronically Signed   By: Marlaine Hind M.D.   On: 03/26/2020 12:45   CT Angio Chest PE W and/or Wo Contrast  Result Date: 03/26/2020 CLINICAL DATA:  [place] 1 week ago, shortness of breath, recently discharged from hospital for COVID-19, clinically high suspicion of pulmonary embolism; past history of atrial fibrillation, stage IV breast cancer, non-Hodgkin's lymphoma, multinodular goiter EXAM: CT ANGIOGRAPHY CHEST WITH CONTRAST TECHNIQUE: Multidetector CT imaging of the chest was performed using the standard protocol during bolus administration of intravenous contrast. Multiplanar CT image reconstructions and MIPs were obtained to evaluate the vascular anatomy. CONTRAST:  61mL OMNIPAQUE IOHEXOL 350 MG/ML SOLN IV COMPARISON:  05/08/2019 FINDINGS: Cardiovascular: Atherosclerotic calcifications aorta, proximal great vessels and coronary arteries. Aneurysmal dilatation of ascending thoracic aorta 4.4 cm diameter. Minimal enlargement of cardiac chambers. No pericardial effusion. Pulmonary arteries adequately opacified and patent. No evidence of pulmonary embolism. Mediastinum/Nodes: RIGHT breast prosthesis. Esophagus contains air throughout a portion of its  length. Enlarged multinodular appearing thyroid lobes with asymmetric enlargement of LEFT lobe. No thoracic adenopathy. Lungs/Pleura: Patchy infiltrates in the lungs bilaterally consistent with multifocal  pneumonia and history of COVID-19. Scattered respiratory motion artifacts. Mild central peribronchial thickening. Biapical scarring. No pleural effusion or pneumothorax. Upper Abdomen: 13 mm diameter cyst RIGHT lobe liver unchanged. Remaining visualized upper abdomen unremarkable. Musculoskeletal: Scattered degenerative disc disease changes of thoracic spine. Review of the MIP images confirms the above findings. IMPRESSION: No evidence of pulmonary embolism. Patchy infiltrates in the lungs bilaterally consistent with multifocal pneumonia and history of COVID-19. Enlarged multinodular appearing thyroid lobes asymmetrically greater on LEFT similar to previous exam; this was previously assessed by thyroid ultrasound in September 2021 with recommendation for annual follow-up to demonstrate a total of 5 years of stability for multiple nodules. Please refer to report of that exam. (Ref: J Am Coll Radiol. 2015 Feb;12(2): 143-50 Aortic Atherosclerosis (ICD10-I70.0). Electronically Signed   By: Lavonia Dana M.D.   On: 03/26/2020 15:24     Procedures  MDM   3:25 PM went in to introduce myself and reevaluate patient. She reports that she had good relief in her pain with fentanyl and with that her shortness of breath improved but now pain is starting to slowly come back. It is reproducible with palpation over the right lateral ribs. Will apply lidocaine patch and give a dose of hydrocodone for more sustained pain relief while awaiting CT angio.  3:31 PM CTA has returned with no evidence of PE but still shows significant patchy infiltrates in the lungs consistent with multifocal pneumonia with history of recent Covid infection, was discharged from the hospital on 2/15. Incidental finding of enlarged multinodular thyroid, will need annual follow-up ultrasounds.   4:00 PM patient ambulated in the department remained O2 sats 94-96%.  She did not have significant increased work of breathing.  Did report feeling some  shortness of breath but reports this has been at baseline for her and is not any worse than usual.  Pain is well controlled.  CT shows continued inflammation of the lungs, suspect she may have some pleurisy contributing to her pain.  Will treat with course of steroids in Norco for pain control encouraged her to use some additional Tylenol with this as well as lidocaine patches.  Provided strict return precautions.  Patient has a pulse ox to use at home.  Encourage PCP follow-up.  At this time patient is stable for discharge home.  Patient and son expressed understanding and agreement with plan.      Jacqlyn Larsen, PA-C 03/26/20 1629    Truddie Hidden, MD 03/26/20 1728

## 2020-03-26 NOTE — ED Notes (Signed)
Ambulating in room and hallway O2 sat decreased for a moment to 94% and returned to 95-96%. Patient did c/o SOB but stated it was about the same as it usually is.

## 2020-03-26 NOTE — Discharge Instructions (Signed)
Your evaluation today has overall been reassuring.  Lab work looks good.  CT shows continued changes from COVID but no evidence of blood clot or rib fracture.  Suspect pain may be due to pleurisy and continued inflammation after recent Covid infection.  Please take steroids for the next 5 days and use prescribed pain medication as needed.  This medication can cause drowsiness, so use caution when taking.  You can take 650 mg of Tylenol every 6 hours in addition to this pain medication.  You can also use over-the-counter Salonpas lidocaine patches which can be applied for 12 hours at a time.  If you develop fevers, worsening cough, worsening chest pain or shortness of breath or O2 sats are persistently below 90% please return for reevaluation otherwise please follow-up closely with your primary care provider.

## 2020-03-26 NOTE — Progress Notes (Addendum)
@Patient  ID: Selena [name], female    DOB: 1935-04-19, 85 y.o.   MRN: [id number]  Chief Complaint  Patient presents with  . pain in right side     Difficulty breathing and stays cold all the time   . shob since covid     03/08/20 and pneumonia  with it   . Chest Pain    For the last couple of weeks and would like an xray     Referring provider: Caren Macadam, MD  HPI  Patient presents today for post COVID care clinic visit.  Patient was last seen in our office on 03/18/20 through televisit.  This was a hospital follow-up.  She was stable at that time but upon arrival to office today she is extremely short of breath and having severe pain to her right side.  We are concerned that she may have a rib fracture or could possibly have a PE.  We discussed that she should go to the emergency room for further evaluation.  Patient is agreeable and son will transport.  Patient was walked in office today and O2 sats dropped to 92% on room air.  Heart rate was stable. Denies f/c/s, n/v/d, hemoptysis, PND, leg swelling Denies chest pain or edema.      Allergies  Allergen Reactions  . Codeine Phosphate Other (See Comments)    Hallucinations  . Adhesive [Tape]     Electrodes for EKG- skin blistering, erythema  . Codeine Other (See Comments)    Hallucinations     Immunization History  Administered Date(s) Administered  . Influenza Split 10/20/2011  . Influenza Whole 11/07/2006  . Influenza, High Dose Seasonal PF 10/13/2016, 10/12/2017  . Influenza-Unspecified 09/07/2013, 10/15/2015, 09/26/2018, 10/01/2019  . Moderna Sars-Covid-2 Vaccination 02/04/2019, 03/04/2019, 12/24/2019  . Pneumococcal Conjugate-13 08/26/2013  . Pneumococcal Polysaccharide-23 10/29/2007, 08/28/2017  . Td 05/27/2008  . Tdap 05/30/2018  . Zoster 10/29/2007    Past Medical History:  Diagnosis Date  . Anemia    PMH  . Aneurysm (arteriovenous) of coronary vessels   . Atrial fibrillation ([place]) 01/01/2007   . BACK PAIN, UPPER 07/09/2008  . Breast cancer, stage 1 ([place])   . Bruises easily   . Cataract    history  . Complication of anesthesia   . Diverticulitis   . DIVERTICULOSIS, COLON 12/26/2006  . Essential hypertension 10/27/2017  . Follicular non-Hodgkin's lymphoma ([place])   . Goiter    multi-nodular  . Hx of colonoscopy 2009  . LIVER FUNCTION TESTS, ABNORMAL, HX OF 12/26/2007  . Memory loss 05/16/2007  . OSTEOARTHRITIS 12/26/2006   takes Diclofenac daily but has stopped for surgery  . PONV (postoperative nausea and vomiting)   . Shortness of breath 05/28/2019  . Skin cancer   . VERTIGO 09/07/2009  . Wears dentures   . Wears glasses   . Wears hearing aid    bilateral    Tobacco History: Social History   Tobacco Use  Smoking Status Former Smoker  . Packs/day: 0.10  . Years: 20.00  . Pack years: 2.00  . Types: Cigarettes  . Quit date: 01/24/1978  . Years since quitting: 42.1  Smokeless Tobacco Never Used   Counseling given: Not Answered   Outpatient Encounter Medications as of 03/26/2020  Medication Sig  . amLODipine (NORVASC) 5 MG tablet TAKE 1 TABLET BY MOUTH DAILY  . apixaban (ELIQUIS) 5 MG TABS tablet Take 1 tablet (5 mg total) by mouth 2 (two) times daily.  . furosemide (LASIX) 20 MG  tablet Take 1 tablet (20 mg total) by mouth daily.  . metoprolol tartrate (LOPRESSOR) 25 MG tablet Take 25 mg by mouth 3 (three) times daily. MAY TAKE ONE EXTRA TABLET FOR HEART RATE OVER 100.  . montelukast (SINGULAIR) 10 MG tablet TAKE 1 TABLET(10 MG) BY MOUTH AT BEDTIME (Patient taking differently: Take 10 mg by mouth at bedtime.)  . Multiple Vitamins-Minerals (PRESERVISION AREDS 2 PO) Take 2 capsules by mouth daily.  . NON FORMULARY CBD oil  . pantoprazole (PROTONIX) 40 MG tablet TAKE 1 TABLET(40 MG) BY MOUTH TWICE DAILY (Patient taking differently: Take 40 mg by mouth 2 (two) times daily.)  . traZODone (DESYREL) 50 MG tablet Take 0.5-1 tablets (25-50 mg total) by mouth at bedtime as needed  for sleep.   No facility-administered encounter medications on file as of 03/26/2020.     Review of Systems  Review of Systems  Constitutional: Positive for fatigue and fever.  HENT: Negative.   Respiratory: Positive for cough and shortness of breath.   Cardiovascular: Positive for chest pain.  Gastrointestinal: Negative.   Allergic/Immunologic: Negative.   Neurological: Positive for weakness.  Psychiatric/Behavioral: Negative.        Physical Exam  BP 130/90   Pulse 73   Temp 98 F (36.7 C)   SpO2 96%   Wt Readings from Last 5 Encounters:  03/10/20 186 lb 8.2 oz (84.6 kg)  02/19/20 198 lb 3.2 oz (89.9 kg)  12/25/19 198 lb (89.8 kg)  12/13/19 194 lb 8 oz (88.2 kg)  12/02/19 199 lb (90.3 kg)     Physical Exam Vitals and nursing note reviewed.  Constitutional:      General: She is in acute distress.     Appearance: She is well-developed and well-nourished. She is ill-appearing.  Cardiovascular:     Rate and Rhythm: Normal rate and regular rhythm.  Pulmonary:     Effort: Tachypnea and respiratory distress present.     Breath sounds: Wheezing present.  Musculoskeletal:     Right lower leg: No edema.     Left lower leg: No edema.  Neurological:     Mental Status: She is alert and oriented to person, place, and time.  Psychiatric:        Mood and Affect: Mood and affect normal.       Imaging: DG Chest Port 1 View  Result Date: 03/09/2020 CLINICAL DATA:  Shortness of breath.  COVID pneumonia. EXAM: PORTABLE CHEST 1 VIEW COMPARISON:  03/08/2020 FINDINGS: Cardiac enlargement is stable. There is a loop recorder identified in the projection of the left heart border. No signs of pleural effusion or edema. Chronic interstitial coarsening is identified bilaterally. No superimposed airspace consolidation. IMPRESSION: 1. Stable cardiac enlargement. 2. Chronic interstitial coarsening. Electronically Signed   By: Kerby Moors M.D.   On: 03/09/2020 08:50   DG Chest Port  1 View  Result Date: 03/08/2020 CLINICAL DATA:  PT BIBA from home with cough, fever, chills, and SOB. Pt tested positive for COVID 2 days ago. Pt is fully vaccinated with booster. EXAM: PORTABLE CHEST 1 VIEW COMPARISON:  05/28/2019 FINDINGS: Loop recorder noted. Atherosclerotic calcification of the aortic arch. Mild enlargement of the cardiopericardial silhouette. Hazy opacities in the lungs most notably at the left lung base and in the right mid lung are observed. Chronic accentuated lung interstitium. Right breast implant noted contributing to the density along the right hemithorax. Mild rightward tracheal deviation likely due to known left eccentric goiter. IMPRESSION: 1. Hazy opacities in the  lungs, left greater than right, favoring indistinct multilobar pneumonia and possibly a manifestation of COVID pneumonia. 2. Mild enlargement of the cardiopericardial silhouette. 3. Chronic accentuated lung interstitium. Electronically Signed   By: Van Clines M.D.   On: 03/08/2020 12:00   ECHOCARDIOGRAM LIMITED  Result Date: 03/09/2020    ECHOCARDIOGRAM LIMITED REPORT   Patient Name:   ENEIDA EVERS Date of Exam: 03/09/2020 Medical Rec #:  [id number]      Height:       66.3 in Accession #:    [id number]     Weight:       197.5 lb Date of Birth:  [birth year]      BSA:          1.995 m Patient Age:    58 years       BP:           140/91 mmHg Patient Gender: F              HR:           77 bpm. Exam Location:  Inpatient Procedure: Limited Echo, Cardiac Doppler and Color Doppler Indications:    CHF-Acute diastolic  History:        Patient has prior history of Echocardiogram examinations, most                 recent 09/07/2017. CHF, Arrythmias:Atrial Fibrillation; Risk                 Factors:Former Smoker and Hypertension. COVID-19. S/P ablation                 of atrial flutter.  Sonographer:    Clayton Lefort RDCS (AE) Referring Phys: [id number] Lequita Halt  Sonographer Comments: COVID-19. IMPRESSIONS  1. Left  ventricular ejection fraction, by estimation, is 55%. The left ventricle has normal function. The left ventricle has no regional wall motion abnormalities. There is mild left ventricular hypertrophy. Left ventricular diastolic parameters are indeterminate.  2. Right ventricular systolic function is normal. The right ventricular size is normal. Tricuspid regurgitation signal is inadequate for assessing PA pressure.  3. Left atrial size was moderately dilated.  4. Right atrial size was moderately dilated.  5. The mitral valve is normal in structure. Trivial mitral valve regurgitation. No evidence of mitral stenosis.  6. The aortic valve is tricuspid. Aortic valve regurgitation is not visualized. Mild aortic valve sclerosis is present, with no evidence of aortic valve stenosis.  7. The inferior vena cava is normal in size with greater than 50% respiratory variability, suggesting right atrial pressure of 3 mmHg.  8. The patient was in atrial fibrillation. FINDINGS  Left Ventricle: Left ventricular ejection fraction, by estimation, is 55%. The left ventricle has normal function. The left ventricle has no regional wall motion abnormalities. The left ventricular internal cavity size was normal in size. There is mild left ventricular hypertrophy. Left ventricular diastolic parameters are indeterminate. Right Ventricle: The right ventricular size is normal. No increase in right ventricular wall thickness. Right ventricular systolic function is normal. Tricuspid regurgitation signal is inadequate for assessing PA pressure. Left Atrium: Left atrial size was moderately dilated. Right Atrium: Right atrial size was moderately dilated. Mitral Valve: The mitral valve is normal in structure. Mild mitral annular calcification. Trivial mitral valve regurgitation. No evidence of mitral valve stenosis. Aortic Valve: The aortic valve is tricuspid. Aortic valve regurgitation is not visualized. Mild aortic valve sclerosis is present, with  no evidence of aortic valve stenosis.  Aortic valve mean gradient measures 3.0 mmHg. Aortic valve peak gradient measures 4.9 mmHg. Aortic valve area, by VTI measures 2.04 cm. Pulmonic Valve: The pulmonic valve was normal in structure. Pulmonic valve regurgitation is not visualized. Aorta: The aortic root is normal in size and structure. Venous: The inferior vena cava is normal in size with greater than 50% respiratory variability, suggesting right atrial pressure of 3 mmHg. LEFT VENTRICLE PLAX 2D LVIDd:         4.50 cm  Diastology LVIDs:         3.30 cm  LV e' medial:    6.85 cm/s LV PW:         1.30 cm  LV E/e' medial:  15.6 LV IVS:        1.40 cm  LV e' lateral:   10.90 cm/s LVOT diam:     1.90 cm  LV E/e' lateral: 9.8 LV SV:         44 LV SV Index:   22 LVOT Area:     2.84 cm  IVC IVC diam: 1.00 cm LEFT ATRIUM         Index LA diam:    3.90 cm 1.95 cm/m  AORTIC VALVE AV Area (Vmax):    2.11 cm AV Area (Vmean):   2.00 cm AV Area (VTI):     2.04 cm AV Vmax:           111.00 cm/s AV Vmean:          75.300 cm/s AV VTI:            0.215 m AV Peak Grad:      4.9 mmHg AV Mean Grad:      3.0 mmHg LVOT Vmax:         82.70 cm/s LVOT Vmean:        53.100 cm/s LVOT VTI:          0.155 m LVOT/AV VTI ratio: 0.72  AORTA Ao Root diam: 3.30 cm Ao Asc diam:  3.30 cm MITRAL VALVE MV Area (PHT): 2.46 cm     SHUNTS MV Decel Time: 309 msec     Systemic VTI:  0.16 m MV E velocity: 107.00 cm/s  Systemic Diam: 1.90 cm MV A velocity: 27.90 cm/s MV E/A ratio:  3.84 Loralie Champagne MD Electronically signed by Loralie Champagne MD Signature Date/Time: 03/09/2020/2:29:21 PM    Final      Assessment & Plan:   Pneumonia due to COVID-19 virus Shortness of breath Right sided chest pain:   Stay well hydrated  Stay active  Deep breathing exercises  Advised patient to go to the ED - Son will transport - concerned for possible PE or Rib fracture considering symptoms - Most likely needs CT scan for further evaluation  Follow  up:  Follow up in 2 weeks or sooner if needed      Fenton Foy, NP 03/26/2020

## 2020-03-26 NOTE — ED Provider Notes (Signed)
[facility] DEPT Provider Note   CSN: [id number] Arrival date & time: 03/26/20  1132     History Chief Complaint  Patient presents with  . Fall  . Shortness of Breath    Lavonne Eldana Isip is a 85 y.o. female has an x-ray of A. fib, diverticulosis, recent COVID-19 infection who presents for evaluation of shortness of breath, right-sided chest pain.  She reports that she was recently diagnosed with COVID-19 and was hospitalized due to pneumonia.  She was discharged on 03/10/2020.  She reports that for small time, she felt better but over the last 3 days, she felt like she has had worsening shortness of breath.  She also is reported she started developing some pain in the right chest.  She states she feels like it is behind her right breast and feels like a stabbing knife pain.  She states it hurts more when she moves, takes a deep breath in.  She states she has trouble getting comfortable secondary to the pain.  She feels able to get up and walk around, she gets very short of breath.  To follow-up with the [place] care clinic today and they recommended her coming to the emergency department to see if she had a broken rib or PE.  She does tell me that she file about 3 weeks ago.  No recent trauma since then.  She has still had a low-grade fever and a cough.  She has not any nausea/vomiting, abdominal pain. She denies any OCP use/exogenous hormone use, recent immobilization, prior history of DVT/PE, recent surgery, leg swelling, or long travel.   The history is provided by the patient.       Past Medical History:  Diagnosis Date  . Anemia    PMH  . Aneurysm (arteriovenous) of coronary vessels   . Atrial fibrillation ([place]) 01/01/2007  . BACK PAIN, UPPER 07/09/2008  . Breast cancer, stage 1 ([place])   . Bruises easily   . Cataract    history  . Complication of anesthesia   . Diverticulitis   . DIVERTICULOSIS, COLON 12/26/2006  . Essential hypertension 10/27/2017  .  Follicular non-Hodgkin's lymphoma ([place])   . Goiter    multi-nodular  . Hx of colonoscopy 2009  . LIVER FUNCTION TESTS, ABNORMAL, HX OF 12/26/2007  . Memory loss 05/16/2007  . OSTEOARTHRITIS 12/26/2006   takes Diclofenac daily but has stopped for surgery  . PONV (postoperative nausea and vomiting)   . Shortness of breath 05/28/2019  . Skin cancer   . VERTIGO 09/07/2009  . Wears dentures   . Wears glasses   . Wears hearing aid    bilateral    Patient Active Problem List   Diagnosis Date Noted  . Chest pain 03/26/2020  . COVID-19 virus infection 03/08/2020  . Pneumonia due to COVID-19 virus 03/08/2020  . Cough 05/28/2019  . Shortness of breath 05/28/2019  . Acute gastric ulcer with hemorrhage   . Acute on chronic diastolic congestive heart failure ([place]) 10/16/2018  . History of atrioventricular nodal ablation 10/16/2018  . Thoracic aortic aneurysm ([place]) 10/16/2018  . Syncope 08/21/2018  . S/P ablation of atrial flutter 07/17/2018  . Upper airway cough syndrome 10/27/2017  . Essential hypertension 10/27/2017  . DOE (dyspnea on exertion) 10/26/2017  . Anxiety 09/10/2014  . Insomnia 09/10/2014  . Follicular 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 ([place]) 07/31/2014  . Paraspinal mass 07/16/2014  . Breast cancer of upper-outer quadrant of right female breast ([place]) 03/21/2011  .  VERTIGO 09/07/2009  . Backache 07/09/2008  . FATIGUE 03/18/2008  . LFTs abnormal 12/26/2007  . MEMORY LOSS 05/16/2007  . ATRIAL FIBRILLATION 01/01/2007  . DIVERTICULOSIS, COLON 12/26/2006  . Osteoarthritis 12/26/2006    Past Surgical History:  Procedure Laterality Date  . ABDOMINAL HYSTERECTOMY    . ABLATION OF DYSRHYTHMIC FOCUS  2009  . APPENDECTOMY    . AUGMENTATION MAMMAPLASTY Right 2013  . BIOPSY  05/07/2019   Procedure: BIOPSY;  Surgeon: Rush Landmark Telford Nab., MD;  Location: [facility];  Service: Gastroenterology;;  . BREAST BIOPSY  2013  . BREAST RECONSTRUCTION  04/14/2011    Procedure: BREAST RECONSTRUCTION;  Surgeon: Crissie Reese, MD;  Location: [facility];  Service: Plastics;  Laterality: Right;  Placement of Right Breast Tissue Expander with  use of Flex HD for Breast Reconstruction  . BREAST SURGERY     right sm, snbx  . CATARACT EXTRACTION W/ INTRAOCULAR LENS  IMPLANT, BILATERAL  2010   bilateral  . COLONOSCOPY    . COLONOSCOPY    . ESOPHAGOGASTRODUODENOSCOPY (EGD) WITH PROPOFOL N/A 05/07/2019   Procedure: ESOPHAGOGASTRODUODENOSCOPY (EGD) WITH PROPOFOL;  Surgeon: Rush Landmark Telford Nab., MD;  Location: [facility];  Service: Gastroenterology;  Laterality: N/A;  . KNEE SURGERY  2004/2010   arthroscopic/ bilateral knee replacements  . LOOP RECORDER IMPLANT    . MASTECTOMY Right 2013  . PORT-A-CATH REMOVAL N/A 12/07/2016   Procedure: REMOVAL PORT-A-CATH;  Surgeon: Rolm Bookbinder, MD;  Location: [facility];  Service: General;  Laterality: N/A;  . PORTACATH PLACEMENT Right 08/11/2014   Procedure: INSERTION PORT-A-CATH WITH ULTRASOUND;  Surgeon: Rolm Bookbinder, MD;  Location: [facility];  Service: General;  Laterality: Right;  . [place]   right  . tmi  1998  . TONSILLECTOMY       OB History   No obstetric history on file.     Family History  Problem Relation Age of Onset  . Colon cancer Mother   . Cancer Mother        colon  . Colon cancer Maternal Aunt   . Cancer Maternal Uncle   . Prostate cancer Maternal Uncle   . Prostate cancer Maternal Uncle   . Other Maternal Uncle   . Cancer Sister        kidney  . Cancer Brother        lymph node  . Anesthesia problems Neg Hx   . Hypotension Neg Hx   . Malignant hyperthermia Neg Hx   . Pseudochol deficiency Neg Hx   . Breast cancer Neg Hx     Social History   Tobacco Use  . Smoking status: Former Smoker    Packs/day: 0.10    Years: 20.00    Pack years: 2.00    Types: Cigarettes    Quit date: 01/24/1978    Years since quitting: 42.1  . Smokeless tobacco: Never Used  Vaping Use  .  Vaping Use: Never used  Substance Use Topics  . Alcohol use: No  . Drug use: No    Home Medications Prior to Admission medications   Medication Sig Start Date End Date Taking? Authorizing Provider  amLODipine (NORVASC) 5 MG tablet TAKE 1 TABLET BY MOUTH DAILY 11/07/19   Burnell Blanks, MD  apixaban (ELIQUIS) 5 MG TABS tablet Take 1 tablet (5 mg total) by mouth 2 (two) times daily. 10/30/19   Burnell Blanks, MD  furosemide (LASIX) 20 MG tablet Take 1 tablet (20 mg total) by mouth daily. 08/26/19  Burnell Blanks, MD  metoprolol tartrate (LOPRESSOR) 25 MG tablet Take 25 mg by mouth 3 (three) times daily. MAY TAKE ONE EXTRA TABLET FOR HEART RATE OVER 100.    [provider]  montelukast (SINGULAIR) 10 MG tablet TAKE 1 TABLET(10 MG) BY MOUTH AT BEDTIME Patient taking differently: Take 10 mg by mouth at bedtime. 02/28/20   Caren Macadam, MD  Multiple Vitamins-Minerals (PRESERVISION AREDS 2 PO) Take 2 capsules by mouth daily.    [provider]  NON FORMULARY CBD oil    [provider]  pantoprazole (PROTONIX) 40 MG tablet TAKE 1 TABLET(40 MG) BY MOUTH TWICE DAILY Patient taking differently: Take 40 mg by mouth 2 (two) times daily. 02/15/20   Noralyn Pick, NP  traZODone (DESYREL) 50 MG tablet Take 0.5-1 tablets (25-50 mg total) by mouth at bedtime as needed for sleep. 02/19/20   Caren Macadam, MD    Allergies    Codeine phosphate, Adhesive [tape], and Codeine  Review of Systems   Review of Systems  Constitutional: Positive for fever.  Respiratory: Positive for cough and shortness of breath.   Cardiovascular: Positive for chest pain. Negative for leg swelling.  Gastrointestinal: Negative for abdominal pain, nausea and vomiting.  Genitourinary: Negative for dysuria and hematuria.  Neurological: Negative for headaches.  All other systems reviewed and are negative.   Physical Exam Updated Vital Signs BP 121/63   Pulse  72   Temp 98.1 F (36.7 C) (Oral)   Resp 16   Ht 5' 6.25" (1.683 m)   Wt 86.6 kg   SpO2 96%   BMI 30.60 kg/m   Physical Exam Vitals and nursing note reviewed.  Constitutional:      Appearance: Normal appearance. She is well-developed and well-nourished.     Comments: Appears uncomfortable.  HENT:     Head: Normocephalic and atraumatic.     Mouth/Throat:     Mouth: Oropharynx is clear and moist and mucous membranes are normal.  Eyes:     General: Lids are normal.     Extraocular Movements: EOM normal.     Conjunctiva/sclera: Conjunctivae normal.     Pupils: Pupils are equal, round, and reactive to light.  Cardiovascular:     Rate and Rhythm: Normal rate and regular rhythm.     Pulses: Normal pulses.     Heart sounds: Normal heart sounds. No murmur heard. No friction rub. No gallop.   Pulmonary:     Comments: Increased work of breathing, tach tachypnea noted.  No obvious rales. Chest:       Comments: Tenderness palpation of the right anterior chest wall.  No deformity or crepitus noted. Abdominal:     Palpations: Abdomen is soft. Abdomen is not rigid.     Tenderness: There is no abdominal tenderness. There is no guarding.  Musculoskeletal:        General: Normal range of motion.     Cervical back: Full passive range of motion without pain.  Skin:    General: Skin is warm and dry.     Capillary Refill: Capillary refill takes less than 2 seconds.  Neurological:     Mental Status: She is alert and oriented to person, place, and time.  Psychiatric:        Mood and Affect: Mood and affect normal.        Speech: Speech normal.     ED Results / Procedures / Treatments   Labs (all labs ordered are listed, but only  abnormal results are displayed) Labs Reviewed  COMPREHENSIVE METABOLIC PANEL - Abnormal; Notable for the following components:      Result Value   Total Protein 6.4 (*)    All other components within normal limits  CBC WITH DIFFERENTIAL/PLATELET - Abnormal;  Notable for the following components:   Monocytes Absolute 1.3 (*)    Abs Immature Granulocytes 0.34 (*)    All other components within normal limits  I-STAT CHEM 8, ED - Abnormal; Notable for the following components:   Hemoglobin 15.6 (*)    All other components within normal limits    EKG None  Radiology DG Chest 2 View  Result Date: 03/26/2020 CLINICAL DATA:  Shortness of breath. Recent COVID-19 viral pneumonia. EXAM: CHEST - 2 VIEW COMPARISON:  03/09/2020 and 05/28/2019 FINDINGS: Stable mild cardiomegaly. Aortic atherosclerotic calcification noted. Cardiac loop recorder is again seen. Coarsening of interstitial lung markings is seen in the peripheral in basilar lung zones, increased since previous study, and consistent with progressive fibrosis. No evidence of pulmonary consolidation or edema. No evidence of pleural effusion. IMPRESSION: Interval progression of pulmonary interstitial fibrosis. No acute findings. Stable mild cardiomegaly. Electronically Signed   By: Marlaine Hind M.D.   On: 03/26/2020 12:45   CT Angio Chest PE W and/or Wo Contrast  Result Date: 03/26/2020 CLINICAL DATA:  [place] 1 week ago, shortness of breath, recently discharged from hospital for COVID-19, clinically high suspicion of pulmonary embolism; past history of atrial fibrillation, stage IV breast cancer, non-Hodgkin's lymphoma, multinodular goiter EXAM: CT ANGIOGRAPHY CHEST WITH CONTRAST TECHNIQUE: Multidetector CT imaging of the chest was performed using the standard protocol during bolus administration of intravenous contrast. Multiplanar CT image reconstructions and MIPs were obtained to evaluate the vascular anatomy. CONTRAST:  22mL OMNIPAQUE IOHEXOL 350 MG/ML SOLN IV COMPARISON:  05/08/2019 FINDINGS: Cardiovascular: Atherosclerotic calcifications aorta, proximal great vessels and coronary arteries. Aneurysmal dilatation of ascending thoracic aorta 4.4 cm diameter. Minimal enlargement of cardiac chambers. No  pericardial effusion. Pulmonary arteries adequately opacified and patent. No evidence of pulmonary embolism. Mediastinum/Nodes: RIGHT breast prosthesis. Esophagus contains air throughout a portion of its length. Enlarged multinodular appearing thyroid lobes with asymmetric enlargement of LEFT lobe. No thoracic adenopathy. Lungs/Pleura: Patchy infiltrates in the lungs bilaterally consistent with multifocal pneumonia and history of COVID-19. Scattered respiratory motion artifacts. Mild central peribronchial thickening. Biapical scarring. No pleural effusion or pneumothorax. Upper Abdomen: 13 mm diameter cyst RIGHT lobe liver unchanged. Remaining visualized upper abdomen unremarkable. Musculoskeletal: Scattered degenerative disc disease changes of thoracic spine. Review of the MIP images confirms the above findings. IMPRESSION: No evidence of pulmonary embolism. Patchy infiltrates in the lungs bilaterally consistent with multifocal pneumonia and history of COVID-19. Enlarged multinodular appearing thyroid lobes asymmetrically greater on LEFT similar to previous exam; this was previously assessed by thyroid ultrasound in September 2021 with recommendation for annual follow-up to demonstrate a total of 5 years of stability for multiple nodules. Please refer to report of that exam. (Ref: J Am Coll Radiol. 2015 Feb;12(2): 143-50 Aortic Atherosclerosis (ICD10-I70.0). Electronically Signed   By: Lavonia Dana M.D.   On: 03/26/2020 15:24    Procedures Procedures   Medications Ordered in ED Medications  albuterol (VENTOLIN HFA) 108 (90 Base) MCG/ACT inhaler 2 puff (has no administration in time range)  lidocaine (LIDODERM) 5 % 1 patch (has no administration in time range)  HYDROcodone-acetaminophen (NORCO/VICODIN) 5-325 MG per tablet 1 tablet (has no administration in time range)  fentaNYL (SUBLIMAZE) injection 50 mcg (50 mcg Intravenous Given 03/26/20  1253)  ondansetron (ZOFRAN) injection 4 mg (4 mg Intravenous Given  03/26/20 1253)  fentaNYL (SUBLIMAZE) injection 25 mcg (25 mcg Intravenous Given 03/26/20 1405)  iohexol (OMNIPAQUE) 350 MG/ML injection 100 mL (82 mLs Intravenous Contrast Given 03/26/20 1439)    ED Course  I have reviewed the triage vital signs and the nursing notes.  Pertinent labs & imaging results that were available during my care of the patient were reviewed by me and considered in my medical decision making (see chart for details).    MDM Rules/Calculators/A&P                          85 year old female who presents for evaluation of right-sided chest pain, shortness of breath.  Was sent over by COVID care clinic for evaluation of potential open rib, PE.  Patient reports that she was recently discharged for COVID.  She had been getting better up until last 3 days when she started having some worsening chest pain, shortness of breath.  She was sent over to ED for further evaluation.  No new traumas, falls, injury.  No PE risk factors.  She does report the pain is worse with deep inspiration as well as movement.  She also feels like she is short of breath with exertion which she states is different for her.  On initial arrival, she is afebrile, appears uncomfortable but nontoxic-appearing.  She is slightly tachypneic.  Vitals otherwise stable.  No hypoxia.  I am able to reproduce pain with palpation of the right anterior chest wall.  Question of this is musculoskeletal versus contusion.  Also consider PE given risk of recent Covid infection.  CBC shows no leukocytosis.  Hemoglobin stable.  CMP is unremarkable.  Chest x-ray negative for any acute infectious etiology.  Given concern for possible PE per previous COVID care clinic, will obtain CTA of chest for further evaluation.  Patient signed out to Benedetto Goad, PA-C pending CTA scan and re-eval.   Portions of this note were generated with [employer]. Dictation errors may occur despite best attempts at proofreading.  Final Clinical  Impression(s) / ED Diagnoses Final diagnoses:  Shortness of breath    Rx / DC Orders ED Discharge Orders    None       Desma Mcgregor 03/26/20 1540    Lajean Saver, MD 03/27/20 1321

## 2020-03-26 NOTE — Assessment & Plan Note (Signed)
Shortness of breath Right sided chest pain:   Stay well hydrated  Stay active  Deep breathing exercises  Advised patient to go to the ED - Son will transport - concerned for possible PE or Rib fracture considering symptoms - Most likely needs CT scan for further evaluation  Follow up:  Follow up in 2 weeks or sooner if needed

## 2020-03-26 NOTE — Patient Instructions (Addendum)
Covid pneumonia Shortness of breath Right sided chest pain:   Stay well hydrated  Stay active  Deep breathing exercises  Advised patient to go to the ED - Son will transport - concerned for possible PE or Rib fracture considering symptoms - Most likely needs CT scan for further evaluation  Follow up:  Follow up in 2 weeks or sooner if needed

## 2020-04-02 ENCOUNTER — Inpatient Hospital Stay (HOSPITAL_COMMUNITY): Payer: Medicare Other

## 2020-04-02 ENCOUNTER — Ambulatory Visit (HOSPITAL_COMMUNITY)
Admission: EM | Admit: 2020-04-02 | Discharge: 2020-04-02 | Disposition: A | Discharge status: Home / Self Care | Payer: Medicare Other

## 2020-04-02 ENCOUNTER — Encounter (HOSPITAL_COMMUNITY): Payer: Self-pay

## 2020-04-02 ENCOUNTER — Encounter (HOSPITAL_COMMUNITY): Payer: Self-pay | Admitting: Pharmacy Technician

## 2020-04-02 ENCOUNTER — Ambulatory Visit: Payer: Medicare Other | Admitting: Gastroenterology

## 2020-04-02 ENCOUNTER — Inpatient Hospital Stay (HOSPITAL_COMMUNITY)
Admission: EM | Admit: 2020-04-02 | Discharge: 2020-04-06 | DRG: 872 | Disposition: A | Discharge status: Home Health | Payer: Medicare Other | Source: Ambulatory Visit | Attending: Internal Medicine | Admitting: Internal Medicine

## 2020-04-02 ENCOUNTER — Other Ambulatory Visit: Payer: Self-pay

## 2020-04-02 ENCOUNTER — Emergency Department (HOSPITAL_COMMUNITY): Payer: Medicare Other

## 2020-04-02 DIAGNOSIS — C8290 Follicular lymphoma, unspecified, unspecified site: Secondary | ICD-10-CM | POA: Diagnosis present

## 2020-04-02 DIAGNOSIS — E785 Hyperlipidemia, unspecified: Secondary | ICD-10-CM | POA: Diagnosis present

## 2020-04-02 DIAGNOSIS — I5032 Chronic diastolic (congestive) heart failure: Secondary | ICD-10-CM | POA: Diagnosis present

## 2020-04-02 DIAGNOSIS — Z79899 Other long term (current) drug therapy: Secondary | ICD-10-CM

## 2020-04-02 DIAGNOSIS — Z7901 Long term (current) use of anticoagulants: Secondary | ICD-10-CM | POA: Diagnosis not present

## 2020-04-02 DIAGNOSIS — K219 Gastro-esophageal reflux disease without esophagitis: Secondary | ICD-10-CM | POA: Diagnosis present

## 2020-04-02 DIAGNOSIS — R55 Syncope and collapse: Secondary | ICD-10-CM

## 2020-04-02 DIAGNOSIS — I110 Hypertensive heart disease with heart failure: Secondary | ICD-10-CM | POA: Diagnosis present

## 2020-04-02 DIAGNOSIS — Z9011 Acquired absence of right breast and nipple: Secondary | ICD-10-CM | POA: Diagnosis not present

## 2020-04-02 DIAGNOSIS — Z8616 Personal history of COVID-19: Secondary | ICD-10-CM

## 2020-04-02 DIAGNOSIS — E876 Hypokalemia: Secondary | ICD-10-CM | POA: Diagnosis present

## 2020-04-02 DIAGNOSIS — Y92009 Unspecified place in unspecified non-institutional (private) residence as the place of occurrence of the external cause: Secondary | ICD-10-CM | POA: Diagnosis not present

## 2020-04-02 DIAGNOSIS — N644 Mastodynia: Secondary | ICD-10-CM | POA: Diagnosis present

## 2020-04-02 DIAGNOSIS — U099 Post covid-19 condition, unspecified: Secondary | ICD-10-CM | POA: Diagnosis present

## 2020-04-02 DIAGNOSIS — W19XXXA Unspecified fall, initial encounter: Secondary | ICD-10-CM

## 2020-04-02 DIAGNOSIS — I482 Chronic atrial fibrillation, unspecified: Secondary | ICD-10-CM | POA: Diagnosis present

## 2020-04-02 DIAGNOSIS — A4151 Sepsis due to Escherichia coli [E. coli]: Secondary | ICD-10-CM | POA: Diagnosis not present

## 2020-04-02 DIAGNOSIS — N179 Acute kidney failure, unspecified: Secondary | ICD-10-CM | POA: Insufficient documentation

## 2020-04-02 DIAGNOSIS — I480 Paroxysmal atrial fibrillation: Secondary | ICD-10-CM | POA: Diagnosis present

## 2020-04-02 DIAGNOSIS — R296 Repeated falls: Secondary | ICD-10-CM

## 2020-04-02 DIAGNOSIS — D72829 Elevated white blood cell count, unspecified: Secondary | ICD-10-CM

## 2020-04-02 DIAGNOSIS — Z90710 Acquired absence of both cervix and uterus: Secondary | ICD-10-CM

## 2020-04-02 DIAGNOSIS — N390 Urinary tract infection, site not specified: Secondary | ICD-10-CM | POA: Diagnosis present

## 2020-04-02 DIAGNOSIS — A419 Sepsis, unspecified organism: Secondary | ICD-10-CM

## 2020-04-02 DIAGNOSIS — E861 Hypovolemia: Secondary | ICD-10-CM | POA: Diagnosis present

## 2020-04-02 DIAGNOSIS — R6520 Severe sepsis without septic shock: Secondary | ICD-10-CM | POA: Diagnosis not present

## 2020-04-02 DIAGNOSIS — R5381 Other malaise: Secondary | ICD-10-CM | POA: Diagnosis present

## 2020-04-02 DIAGNOSIS — Z853 Personal history of malignant neoplasm of breast: Secondary | ICD-10-CM | POA: Diagnosis not present

## 2020-04-02 DIAGNOSIS — G4700 Insomnia, unspecified: Secondary | ICD-10-CM

## 2020-04-02 DIAGNOSIS — Z87891 Personal history of nicotine dependence: Secondary | ICD-10-CM | POA: Diagnosis not present

## 2020-04-02 DIAGNOSIS — N170 Acute kidney failure with tubular necrosis: Secondary | ICD-10-CM | POA: Diagnosis not present

## 2020-04-02 HISTORY — DX: Sepsis, unspecified organism: A41.9

## 2020-04-02 LAB — CBC
HCT: 46.1 % — ABNORMAL HIGH (ref 36.0–46.0)
Hemoglobin: 14.9 g/dL (ref 12.0–15.0)
MCH: 31.2 pg (ref 26.0–34.0)
MCHC: 32.3 g/dL (ref 30.0–36.0)
MCV: 96.6 fL (ref 80.0–100.0)
Platelets: 164 10*3/uL (ref 150–400)
RBC: 4.77 MIL/uL (ref 3.87–5.11)
RDW: 15 % (ref 11.5–15.5)
WBC: 50.6 10*3/uL (ref 4.0–10.5)
nRBC: 0 % (ref 0.0–0.2)

## 2020-04-02 LAB — BASIC METABOLIC PANEL
Anion gap: 14 (ref 5–15)
BUN: 31 mg/dL — ABNORMAL HIGH (ref 8–23)
CO2: 23 mmol/L (ref 22–32)
Calcium: 8.9 mg/dL (ref 8.9–10.3)
Chloride: 99 mmol/L (ref 98–111)
Creatinine, Ser: 1.45 mg/dL — ABNORMAL HIGH (ref 0.44–1.00)
GFR, Estimated: 36 mL/min — ABNORMAL LOW (ref >60)
Glucose, Bld: 134 mg/dL — ABNORMAL HIGH (ref 70–99)
Potassium: 3.7 mmol/L (ref 3.5–5.1)
Sodium: 136 mmol/L (ref 135–145)

## 2020-04-02 LAB — URINALYSIS, ROUTINE W REFLEX MICROSCOPIC
Bilirubin Urine: NEGATIVE
Glucose, UA: NEGATIVE mg/dL
Ketones, ur: NEGATIVE mg/dL
Nitrite: NEGATIVE
Protein, ur: 30 mg/dL — AB
Specific Gravity, Urine: 1.012 (ref 1.005–1.030)
WBC, UA: >50 WBC/hpf — ABNORMAL HIGH (ref 0–5)
pH: 5 (ref 5.0–8.0)

## 2020-04-02 LAB — CBG MONITORING, ED: Glucose-Capillary: 122 mg/dL — ABNORMAL HIGH (ref 70–99)

## 2020-04-02 LAB — HEPATIC FUNCTION PANEL
ALT: 39 U/L (ref 0–44)
AST: 35 U/L (ref 15–41)
Albumin: 3 g/dL — ABNORMAL LOW (ref 3.5–5.0)
Alkaline Phosphatase: 105 U/L (ref 38–126)
Bilirubin, Direct: 0.3 mg/dL — ABNORMAL HIGH (ref 0.0–0.2)
Indirect Bilirubin: 1.1 mg/dL — ABNORMAL HIGH (ref 0.3–0.9)
Total Bilirubin: 1.4 mg/dL — ABNORMAL HIGH (ref 0.3–1.2)
Total Protein: 5.5 g/dL — ABNORMAL LOW (ref 6.5–8.1)

## 2020-04-02 LAB — LACTIC ACID, PLASMA: Lactic Acid, Venous: 2.9 mmol/L (ref 0.5–1.9)

## 2020-04-02 MED ORDER — APIXABAN 5 MG PO TABS
5.0000 mg | ORAL_TABLET | Freq: Two times a day (BID) | ORAL | Status: DC
  Administered 2020-04-03 – 2020-04-06 (×7): 5 mg via ORAL
  Filled 2020-04-02 (×7): qty 1

## 2020-04-02 MED ORDER — HYDROCODONE-ACETAMINOPHEN 5-325 MG PO TABS
1.0000 | ORAL_TABLET | Freq: Four times a day (QID) | ORAL | Status: DC | PRN
  Administered 2020-04-04 – 2020-04-05 (×2): 1 via ORAL
  Filled 2020-04-02 (×2): qty 1

## 2020-04-02 MED ORDER — METRONIDAZOLE IN NACL 5-0.79 MG/ML-% IV SOLN
500.0000 mg | Freq: Once | INTRAVENOUS | Status: AC
  Administered 2020-04-02: 500 mg via INTRAVENOUS
  Filled 2020-04-02: qty 100

## 2020-04-02 MED ORDER — LACTATED RINGERS IV BOLUS (SEPSIS)
1000.0000 mL | Freq: Once | INTRAVENOUS | Status: AC
  Administered 2020-04-02: 1000 mL via INTRAVENOUS

## 2020-04-02 MED ORDER — PANTOPRAZOLE SODIUM 40 MG PO TBEC
40.0000 mg | DELAYED_RELEASE_TABLET | Freq: Two times a day (BID) | ORAL | Status: DC
  Administered 2020-04-02 – 2020-04-06 (×8): 40 mg via ORAL
  Filled 2020-04-02 (×8): qty 1

## 2020-04-02 MED ORDER — SODIUM CHLORIDE 0.9 % IV SOLN
2.0000 g | INTRAVENOUS | Status: DC
  Administered 2020-04-02: 2 g via INTRAVENOUS
  Filled 2020-04-02: qty 20

## 2020-04-02 MED ORDER — LACTATED RINGERS IV SOLN: INTRAVENOUS | Status: DC

## 2020-04-02 MED ORDER — TRAZODONE HCL 50 MG PO TABS
50.0000 mg | ORAL_TABLET | Freq: Every evening | ORAL | Status: DC | PRN
  Administered 2020-04-03 – 2020-04-05 (×3): 50 mg via ORAL
  Filled 2020-04-02 (×3): qty 1

## 2020-04-02 MED ORDER — METOPROLOL TARTRATE 25 MG PO TABS
25.0000 mg | ORAL_TABLET | Freq: Three times a day (TID) | ORAL | Status: DC
  Administered 2020-04-02 – 2020-04-05 (×9): 25 mg via ORAL
  Filled 2020-04-02 (×9): qty 1

## 2020-04-02 MED ORDER — SODIUM CHLORIDE 0.9 % IV SOLN: 2.0000 g | Freq: Once | INTRAVENOUS | Status: DC

## 2020-04-02 MED ORDER — MONTELUKAST SODIUM 10 MG PO TABS
10.0000 mg | ORAL_TABLET | Freq: Every day | ORAL | Status: DC
  Administered 2020-04-02 – 2020-04-05 (×4): 10 mg via ORAL
  Filled 2020-04-02 (×5): qty 1

## 2020-04-02 MED ORDER — HYDROMORPHONE HCL 1 MG/ML IJ SOLN
0.5000 mg | INTRAMUSCULAR | Status: DC | PRN
  Administered 2020-04-04 (×2): 0.5 mg via INTRAVENOUS
  Filled 2020-04-02 (×2): qty 0.5
  Filled 2020-04-02: qty 1

## 2020-04-02 MED ORDER — LACTATED RINGERS IV SOLN: INTRAVENOUS | Status: AC

## 2020-04-02 MED ORDER — VANCOMYCIN VARIABLE DOSE PER UNSTABLE RENAL FUNCTION (PHARMACIST DOSING): Status: DC

## 2020-04-02 MED ORDER — VANCOMYCIN HCL 1750 MG/350ML IV SOLN
1750.0000 mg | Freq: Once | INTRAVENOUS | Status: AC
  Administered 2020-04-02: 1750 mg via INTRAVENOUS
  Filled 2020-04-02: qty 350

## 2020-04-02 MED ORDER — SODIUM CHLORIDE 0.9 % IV SOLN
2.0000 g | Freq: Two times a day (BID) | INTRAVENOUS | Status: DC
  Administered 2020-04-02: 2 g via INTRAVENOUS
  Filled 2020-04-02: qty 2

## 2020-04-02 NOTE — ED Notes (Signed)
Patient is being discharged from the Urgent Care and sent to the Emergency Department via pov . Per Jaynee Eagles, PA, patient is in need of higher level of care due to fall with LOC, needs head scan. Patient is aware and verbalizes understanding of plan of care.  Vitals:   04/02/20 1106 04/02/20 1109  BP: 101/61   Pulse: 79   Resp:  20  SpO2: 97%

## 2020-04-02 NOTE — Sepsis Progress Note (Signed)
eLink is monitoring this Code Sepsis. °

## 2020-04-02 NOTE — Progress Notes (Signed)
Pharmacy Antibiotic Note  Selena [name] is a 85 y.o. female admitted on 04/02/2020 with sepsis of unknown source. Pt s/p fall this morning and presents with hypotension, WBC of 50.6, and an AKI with Scr 1.45 (BL 0.6). She presented to the ED on 3/3 for SOB and chest pain, PE was r/o. CT showed patchy infiltrates and inflammation 2/2 recent covid infection and pt was DC'd on steroids/pain management. Pharmacy has been consulted for Vancomycin and Cefepime dosing.  Plan: Vancomycin 1750 mg IV x1. Trend Scr/AKI improvement for further dosing.  Cefepime 2g IV q12 hours. Follow cultures, renal function, clinical progression and treatment plan.  Temp (24hrs), Avg:97.8 F (36.6 C), Min:97.8 F (36.6 C), Max:97.8 F (36.6 C)  Recent Labs  Lab 04/02/20 1133  WBC 50.6*  CREATININE 1.45*    Estimated Creatinine Clearance: 33.3 mL/min (A) (by C-G formula based on SCr of 1.45 mg/dL (H)).    Allergies  Allergen Reactions  . Codeine Phosphate Other (See Comments)    Hallucinations  . Adhesive [Tape]     Electrodes for EKG- skin blistering, erythema  . Codeine Other (See Comments)    Hallucinations     Antimicrobials this admission: Vancomycin 3/10 >>  Cefepime 3/10 >>  Metronidazole 3/10 >>  Dose adjustments this admission: N/A  Microbiology results: 3/10 BCx x2: pending 3/10 UCx: pending   Thank you for allowing pharmacy to be a part of this patient's care.  Jacobo Forest PharmD Candidate 2022 04/02/2020 2:02 PM

## 2020-04-02 NOTE — ED Notes (Signed)
Patient transported to X-ray 

## 2020-04-02 NOTE — ED Provider Notes (Signed)
[facility] EMERGENCY DEPARTMENT Provider Note   CSN: [id number] Arrival date & time: 04/02/20  1117     History Chief Complaint  Patient presents with  . Fall  . Hypotension    Selena [name] is a 85 y.o. female presented for evaluation of fall vs syncope.  Patient states she does not know what happened this morning.  She woke up and she was on the floor between her walker and the counter.  She does not know she fell or if she passed out.  She does not report any head pain since then.  She is having right-sided breast breast pain, which he states is not new for her.  She does have some mild worsening shortness of breath.  She had a fever yesterday, but has been afebrile today.  She has not taken any Tylenol.  She denies new cough, nausea, vomiting, Donnell pain, urinary symptoms, abnormal bowel movements.  She is on a blood thinner, last dose was this morning.  She takes this for A. Fib.  Additional history taken chart reviewed.  Patient with a history of A. fib on anticoagulation, breast cancer status postmastectomy, diverticulosis, hypertension, non-Hodgkin's lymphoma, vertigo.  She was recently admitted for [place].  In that time, she has had a PE study, 1 week ago, which was negative for PE, did show multifocal pna. Pt has been on steroids.   HPI     Past Medical History:  Diagnosis Date  . Anemia    PMH  . Aneurysm (arteriovenous) of coronary vessels   . Atrial fibrillation ([place]) 01/01/2007  . BACK PAIN, UPPER 07/09/2008  . Breast cancer, stage 1 ([place])   . Bruises easily   . Cataract    history  . Complication of anesthesia   . Diverticulitis   . DIVERTICULOSIS, COLON 12/26/2006  . Essential hypertension 10/27/2017  . Follicular non-Hodgkin's lymphoma ([place])   . Goiter    multi-nodular  . Hx of colonoscopy 2009  . LIVER FUNCTION TESTS, ABNORMAL, HX OF 12/26/2007  . Memory loss 05/16/2007  . OSTEOARTHRITIS 12/26/2006   takes Diclofenac daily but has  stopped for surgery  . PONV (postoperative nausea and vomiting)   . Shortness of breath 05/28/2019  . Skin cancer   . VERTIGO 09/07/2009  . Wears dentures   . Wears glasses   . Wears hearing aid    bilateral    Patient Active Problem List   Diagnosis Date Noted  . Chest pain 03/26/2020  . COVID-19 virus infection 03/08/2020  . Pneumonia due to COVID-19 virus 03/08/2020  . Cough 05/28/2019  . Shortness of breath 05/28/2019  . Acute gastric ulcer with hemorrhage   . Acute on chronic diastolic congestive heart failure ([place]) 10/16/2018  . History of atrioventricular nodal ablation 10/16/2018  . Thoracic aortic aneurysm ([place]) 10/16/2018  . Syncope 08/21/2018  . S/P ablation of atrial flutter 07/17/2018  . Upper airway cough syndrome 10/27/2017  . Essential hypertension 10/27/2017  . DOE (dyspnea on exertion) 10/26/2017  . Anxiety 09/10/2014  . Insomnia 09/10/2014  . Follicular 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 ([place]) 07/31/2014  . Paraspinal mass 07/16/2014  . Breast cancer of upper-outer quadrant of right female breast ([place]) 03/21/2011  . VERTIGO 09/07/2009  . Backache 07/09/2008  . FATIGUE 03/18/2008  . LFTs abnormal 12/26/2007  . MEMORY LOSS 05/16/2007  . ATRIAL FIBRILLATION 01/01/2007  . DIVERTICULOSIS, COLON 12/26/2006  . Osteoarthritis 12/26/2006    Past Surgical History:  Procedure Laterality Date  .  ABDOMINAL HYSTERECTOMY    . ABLATION OF DYSRHYTHMIC FOCUS  2009  . APPENDECTOMY    . AUGMENTATION MAMMAPLASTY Right 2013  . BIOPSY  05/07/2019   Procedure: BIOPSY;  Surgeon: Rush Landmark Telford Nab., MD;  Location: [facility];  Service: Gastroenterology;;  . BREAST BIOPSY  2013  . BREAST RECONSTRUCTION  04/14/2011   Procedure: BREAST RECONSTRUCTION;  Surgeon: Crissie Reese, MD;  Location: [facility];  Service: Plastics;  Laterality: Right;  Placement of Right Breast Tissue Expander with  use of Flex HD for Breast Reconstruction  . BREAST SURGERY     right sm,  snbx  . CATARACT EXTRACTION W/ INTRAOCULAR LENS  IMPLANT, BILATERAL  2010   bilateral  . COLONOSCOPY    . COLONOSCOPY    . ESOPHAGOGASTRODUODENOSCOPY (EGD) WITH PROPOFOL N/A 05/07/2019   Procedure: ESOPHAGOGASTRODUODENOSCOPY (EGD) WITH PROPOFOL;  Surgeon: Rush Landmark Telford Nab., MD;  Location: [facility];  Service: Gastroenterology;  Laterality: N/A;  . KNEE SURGERY  2004/2010   arthroscopic/ bilateral knee replacements  . LOOP RECORDER IMPLANT    . MASTECTOMY Right 2013  . PORT-A-CATH REMOVAL N/A 12/07/2016   Procedure: REMOVAL PORT-A-CATH;  Surgeon: Rolm Bookbinder, MD;  Location: [facility];  Service: General;  Laterality: N/A;  . PORTACATH PLACEMENT Right 08/11/2014   Procedure: INSERTION PORT-A-CATH WITH ULTRASOUND;  Surgeon: Rolm Bookbinder, MD;  Location: [facility];  Service: General;  Laterality: Right;  . [place]   right  . tmi  1998  . TONSILLECTOMY       OB History   No obstetric history on file.     Family History  Problem Relation Age of Onset  . Colon cancer Mother   . Cancer Mother        colon  . Colon cancer Maternal Aunt   . Cancer Maternal Uncle   . Prostate cancer Maternal Uncle   . Prostate cancer Maternal Uncle   . Other Maternal Uncle   . Cancer Sister        kidney  . Cancer Brother        lymph node  . Anesthesia problems Neg Hx   . Hypotension Neg Hx   . Malignant hyperthermia Neg Hx   . Pseudochol deficiency Neg Hx   . Breast cancer Neg Hx     Social History   Tobacco Use  . Smoking status: Former Smoker    Packs/day: 0.10    Years: 20.00    Pack years: 2.00    Types: Cigarettes    Quit date: 01/24/1978    Years since quitting: 42.2  . Smokeless tobacco: Never Used  Vaping Use  . Vaping Use: Never used  Substance Use Topics  . Alcohol use: No  . Drug use: No    Home Medications Prior to Admission medications   Medication Sig Start Date End Date Taking? Authorizing Provider  amLODipine (NORVASC) 5 MG tablet  TAKE 1 TABLET BY MOUTH DAILY 11/07/19   Burnell Blanks, MD  apixaban (ELIQUIS) 5 MG TABS tablet Take 1 tablet (5 mg total) by mouth 2 (two) times daily. 10/30/19   Burnell Blanks, MD  furosemide (LASIX) 20 MG tablet Take 1 tablet (20 mg total) by mouth daily. 08/26/19   Burnell Blanks, MD  HYDROcodone-acetaminophen (NORCO) 5-325 MG tablet Take 1 tablet by mouth every 6 (six) hours as needed. 03/26/20   Jacqlyn Larsen, PA-C  metoprolol tartrate (LOPRESSOR) 25 MG tablet Take 25 mg by mouth 3 (three) times daily.  MAY TAKE ONE EXTRA TABLET FOR HEART RATE OVER 100.    [provider]  montelukast (SINGULAIR) 10 MG tablet TAKE 1 TABLET(10 MG) BY MOUTH AT BEDTIME Patient taking differently: Take 10 mg by mouth at bedtime. 02/28/20   Caren Macadam, MD  Multiple Vitamins-Minerals (PRESERVISION AREDS 2 PO) Take 2 capsules by mouth daily.    [provider]  NON FORMULARY CBD oil    [provider]  pantoprazole (PROTONIX) 40 MG tablet TAKE 1 TABLET(40 MG) BY MOUTH TWICE DAILY Patient taking differently: Take 40 mg by mouth 2 (two) times daily. 02/15/20   Noralyn Pick, NP  traZODone (DESYREL) 50 MG tablet Take 0.5-1 tablets (25-50 mg total) by mouth at bedtime as needed for sleep. 02/19/20   Caren Macadam, MD    Allergies    Codeine phosphate, Adhesive [tape], and Codeine  Review of Systems   Review of Systems  Constitutional: Positive for fever.  Respiratory: Positive for shortness of breath.   Neurological: Positive for syncope.  Hematological: Bruises/bleeds easily.  All other systems reviewed and are negative.   Physical Exam Updated Vital Signs BP (!) 123/105   Pulse 95   Temp 97.8 F (36.6 C) (Temporal)   Resp (!) 23   SpO2 97%   Physical Exam Vitals and nursing note reviewed. Exam conducted with a chaperone present.  Constitutional:      Appearance: She is well-developed.     Comments: Appears weak/tired  HENT:      Head: Normocephalic and atraumatic.  Eyes:     Extraocular Movements: Extraocular movements intact.     Conjunctiva/sclera: Conjunctivae normal.     Pupils: Pupils are equal, round, and reactive to light.  Cardiovascular:     Rate and Rhythm: Normal rate and regular rhythm.     Pulses: Normal pulses.  Pulmonary:     Effort: Pulmonary effort is normal. No respiratory distress.     Breath sounds: Normal breath sounds. No wheezing.     Comments: Tenderness palpation of the right side chest wall, patient states is baseline Chest:     Chest wall: Tenderness present.  Abdominal:     General: There is no distension.     Palpations: Abdomen is soft. There is no mass.     Tenderness: There is no abdominal tenderness. There is no guarding or rebound.  Genitourinary:    Rectum: Guaiac result negative.     Comments: No gross blood noted on exam Musculoskeletal:        General: Normal range of motion.     Cervical back: Normal range of motion and neck supple.  Skin:    General: Skin is warm and dry.     Capillary Refill: Capillary refill takes less than 2 seconds.  Neurological:     Mental Status: She is alert and oriented to person, place, and time.     ED Results / Procedures / Treatments   Labs (all labs ordered are listed, but only abnormal results are displayed) Labs Reviewed  BASIC METABOLIC PANEL - Abnormal; Notable for the following components:      Result Value   Glucose, Bld 134 (*)    BUN 31 (*)    Creatinine, Ser 1.45 (*)    GFR, Estimated 36 (*)    All other components within normal limits  CBC - Abnormal; Notable for the following components:   WBC 50.6 (*)    HCT 46.1 (*)    All other components within  normal limits  LACTIC ACID, PLASMA - Abnormal; Notable for the following components:   Lactic Acid, Venous 2.9 (*)    All other components within normal limits  HEPATIC FUNCTION PANEL - Abnormal; Notable for the following components:   Total Protein 5.5 (*)     Albumin 3.0 (*)    Total Bilirubin 1.4 (*)    Bilirubin, Direct 0.3 (*)    Indirect Bilirubin 1.1 (*)    All other components within normal limits  CBG MONITORING, ED - Abnormal; Notable for the following components:   Glucose-Capillary 122 (*)    All other components within normal limits  CULTURE, BLOOD (ROUTINE X 2)  CULTURE, BLOOD (ROUTINE X 2)  URINE CULTURE  URINALYSIS, ROUTINE W REFLEX MICROSCOPIC  LACTIC ACID, PLASMA  DIFFERENTIAL  POC OCCULT BLOOD, ED    EKG EKG Interpretation  Date/Time:  Thursday April 02 2020 11:31:00 EST Ventricular Rate:  81 PR Interval:    QRS Duration: 78 QT Interval:  368 QTC Calculation: 427 R Axis:   34 Text Interpretation: Atrial fibrillation Low voltage QRS Abnormal ECG Afib, no acute change from previous Confirmed by Lavenia Atlas [phone]) on 04/02/2020 1:03:39 PM   Radiology DG Chest 2 View  Result Date: 04/02/2020 CLINICAL DATA:  Hypotension EXAM: CHEST - 2 VIEW COMPARISON:  03/26/2020 FINDINGS: Stable cardiomegaly. Loop recorder device is in place. Chronically coarsened interstitial markings bilaterally. Aeration of the lung fields has slightly improved from prior. No new focal airspace consolidation. No pleural effusion or pneumothorax. IMPRESSION: 1. Slightly improved aeration of the lung fields bilaterally. 2. Chronic interstitial lung changes. Electronically Signed   By: Davina Poke D.O.   On: 04/02/2020 13:43   CT Head Wo Contrast  Result Date: 04/02/2020 CLINICAL DATA:  Fall this morning.  Hypotensive. EXAM: CT HEAD WITHOUT CONTRAST TECHNIQUE: Contiguous axial images were obtained from the base of the skull through the vertex without intravenous contrast. COMPARISON:  05/30/2018 FINDINGS: Brain: No mass lesion, hemorrhage, hydrocephalus, acute infarct, intra-axial, or extra-axial fluid collection. Vascular: Intracranial atherosclerosis. Skull: No significant soft tissue swelling. Hyperostosis frontalis interna. No skull fracture.  Sinuses/Orbits: Normal imaged portions of the orbits and globes. Fluid in both maxillary sinuses. Clear mastoid air cells. Other: None. IMPRESSION: 1.  No acute intracranial abnormality. 2. Sinus disease. Electronically Signed   By: Abigail Miyamoto M.D.   On: 04/02/2020 14:44    Procedures .Critical Care Performed by: Franchot Heidelberg, PA-C Authorized by: Franchot Heidelberg, PA-C   Critical care provider statement:    Critical care time (minutes):  45   Critical care time was exclusive of:  Separately billable procedures and treating other patients and teaching time   Critical care was necessary to treat or prevent imminent or life-threatening deterioration of the following conditions:  Sepsis   Critical care was time spent personally by me on the following activities:  Blood draw for specimens, development of treatment plan with patient or surrogate, evaluation of patient's response to treatment, examination of patient, obtaining history from patient or surrogate, ordering and performing treatments and interventions, ordering and review of laboratory studies, ordering and review of radiographic studies, pulse oximetry, re-evaluation of patient's condition and review of old charts   I assumed direction of critical care for this patient from another provider in my specialty: no     Care discussed with: admitting provider   Comments:     Pt presenting with hypotension. Significant leukocytosis today. Code sepsis called. Pt on broad spectrum abx and admitted to  medicine.      Medications Ordered in ED Medications  lactated ringers infusion ( Intravenous New Bag/Given (Non-Interop) 04/02/20 1511)  vancomycin (VANCOREADY) IVPB 1750 mg/350 mL (1,750 mg Intravenous New Bag/Given (Non-Interop) 04/02/20 1438)  ceFEPIme (MAXIPIME) 2 g in sodium chloride 0.9 % 100 mL IVPB (0 g Intravenous Stopped 04/02/20 1510)  lactated ringers bolus 1,000 mL (0 mLs Intravenous Stopped 04/02/20 1544)    And  lactated  ringers bolus 1,000 mL (0 mLs Intravenous Stopped 04/02/20 1544)    And  lactated ringers bolus 1,000 mL (0 mLs Intravenous Stopped 04/02/20 1544)  metroNIDAZOLE (FLAGYL) IVPB 500 mg (0 mg Intravenous Stopped 04/02/20 1510)    ED Course  I have reviewed the triage vital signs and the nursing notes.  Pertinent labs & imaging results that were available during my care of the patient were reviewed by me and considered in my medical decision making (see chart for details).    MDM Rules/Calculators/A&P                           Patient presented for evaluation after a fall of her syncope.  On exam, patient appears tired/weak.  She is hypotensive in the 80s.  Patient does not know she fell or passed out.  Does not know she hit her head.  Will obtain CT head as patient is on blood thinner.  I am concerned about her history of fever yesterday and hypotension today, consider infection.  Also consider anemia as patient is on a blood thinner.  Consider cardiac arrhythmia, patient with a history of A. fib.  Will obtain labs, EKG, chest x-ray.  Patient had a PE study last week, I do not like she needs repeat.  CT head negative for acute findings.  Chest x-ray viewed interpreted by me, no obvious pneumonia, no thorax effusion.  Per radiology, it is improved from previous.  Labs concerning for significant leukocytosis of 50.  Patient also with an AKI with a creatinine of 1.45.  She is in A. fib, without tachycardia, this is not new for her.  Urine is pending, this may be a potential source.  Due to leukocytosis, hypotension, and fever yesterday, code sepsis called.  30 cc per kg ordered and broad-spectrum antibiotics.  Discussed findings with patient and daughter.  Discussed my concern for sepsis and plan for admission.  Patient and daughter are agreeable to plan  Discussed with Dr. Roosevelt Locks from [facility] hospitalist service, patient to be admitted.  Final Clinical Impression(s) / ED Diagnoses Final diagnoses:  None     Rx / DC Orders ED Discharge Orders    None       Franchot Heidelberg, PA-C 04/02/20 Carmen, Alvin Critchley, DO 04/03/20 [id number]

## 2020-04-02 NOTE — ED Notes (Signed)
Patient transported to CT 

## 2020-04-02 NOTE — ED Notes (Signed)
Pt placed on hospital bed

## 2020-04-02 NOTE — H&P (Signed)
History and Physical    Selena [name] IWO:[id number] DOB: 1935-12-15 DOA: 04/02/2020  PCP: Caren Macadam, MD (Confirm with patient/family/NH records and if not entered, this has to be entered at [facility] point of entry) Patient coming from: Home  I have personally briefly reviewed patient's old medical records in [place]  Chief Complaint: I fell  HPI: Selena [name] is a 85 y.o. female with medical history significant of recent Covid pneumonia February 2022, chronic A. fib on Eliquis, chronic diastolic CHF, HTN, BPV, remote history of non-Hodgkin's lymphoma, presented with fall.  Patient remembered she was sitting at the bed side and then may slipped down to the floor.  She says she been feeling weak since yesterday, but denied any numbness weakness of the limbs.  No nauseous vomiting abdominal pain, and she has been eating and drinking as usual.  No fever chills no pain.  Then family took her to urgent care where it was found patient blood pressure on the lower side, physician at urgent care recommend patient come to ED to rule out intracranial bleeding given patient on Eliquis and acute fall.  Patient denied any headache blurry vision.    One week ago on 3/3, patient came to ED for new onset of right-sided chest pain and shortness of breath, CT angiogram was done negative for PE, ED diagnosed her with some kind of pleural inflammation and 7 days course of steroid ordered which patient just completed yesterday.  Daughter at bedside reported patient has no cough and chest pain subsided.  ED Course: CT head negative for acute findings or bleeding.  However started to complain about increasing shortness of breath and hypotension blood pressure 88/66.  Blood pressure improved to systolic 500B after 3 L of IV boluses.  Blood work showed [employer].  While in the ED, patient suddenly started complaining about severe back pain 10 out of 10, which is new.  She attributed to the  uncomfortable biting in the ED.  Denied any trouble moving legs or numbness of the legs.  Review of Systems: As per HPI otherwise 14 point review of systems negative.    Past Medical History:  Diagnosis Date  . Anemia    PMH  . Aneurysm (arteriovenous) of coronary vessels   . Atrial fibrillation ([place]) 01/01/2007  . BACK PAIN, UPPER 07/09/2008  . Breast cancer, stage 1 ([place])   . Bruises easily   . Cataract    history  . Complication of anesthesia   . Diverticulitis   . DIVERTICULOSIS, COLON 12/26/2006  . Essential hypertension 10/27/2017  . Follicular non-Hodgkin's lymphoma ([place])   . Goiter    multi-nodular  . Hx of colonoscopy 2009  . LIVER FUNCTION TESTS, ABNORMAL, HX OF 12/26/2007  . Memory loss 05/16/2007  . OSTEOARTHRITIS 12/26/2006   takes Diclofenac daily but has stopped for surgery  . PONV (postoperative nausea and vomiting)   . Shortness of breath 05/28/2019  . Skin cancer   . VERTIGO 09/07/2009  . Wears dentures   . Wears glasses   . Wears hearing aid    bilateral    Past Surgical History:  Procedure Laterality Date  . ABDOMINAL HYSTERECTOMY    . ABLATION OF DYSRHYTHMIC FOCUS  2009  . APPENDECTOMY    . AUGMENTATION MAMMAPLASTY Right 2013  . BIOPSY  05/07/2019   Procedure: BIOPSY;  Surgeon: Rush Landmark Telford Nab., MD;  Location: [facility];  Service: Gastroenterology;;  . BREAST BIOPSY  2013  . BREAST RECONSTRUCTION  04/14/2011   Procedure: BREAST RECONSTRUCTION;  Surgeon: Crissie Reese, MD;  Location: [facility];  Service: Plastics;  Laterality: Right;  Placement of Right Breast Tissue Expander with  use of Flex HD for Breast Reconstruction  . BREAST SURGERY     right sm, snbx  . CATARACT EXTRACTION W/ INTRAOCULAR LENS  IMPLANT, BILATERAL  2010   bilateral  . COLONOSCOPY    . COLONOSCOPY    . ESOPHAGOGASTRODUODENOSCOPY (EGD) WITH PROPOFOL N/A 05/07/2019   Procedure: ESOPHAGOGASTRODUODENOSCOPY (EGD) WITH PROPOFOL;  Surgeon: Rush Landmark Telford Nab., MD;  Location: [facility];  Service: Gastroenterology;  Laterality: N/A;  . KNEE SURGERY  2004/2010   arthroscopic/ bilateral knee replacements  . LOOP RECORDER IMPLANT    . MASTECTOMY Right 2013  . PORT-A-CATH REMOVAL N/A 12/07/2016   Procedure: REMOVAL PORT-A-CATH;  Surgeon: Rolm Bookbinder, MD;  Location: [facility];  Service: General;  Laterality: N/A;  . PORTACATH PLACEMENT Right 08/11/2014   Procedure: INSERTION PORT-A-CATH WITH ULTRASOUND;  Surgeon: Rolm Bookbinder, MD;  Location: [place];  Service: General;  Laterality: Right;  . [place]   right  . tmi  1998  . TONSILLECTOMY       reports that she quit smoking about 42 years ago. Her smoking use included cigarettes. She has a 2.00 pack-year smoking history. She has never used smokeless tobacco. She reports that she does not drink alcohol and does not use drugs.  Allergies  Allergen Reactions  . Codeine Phosphate Other (See Comments)    Hallucinations  . Adhesive [Tape]     Electrodes for EKG- skin blistering, erythema  . Codeine Other (See Comments)    Hallucinations     Family History  Problem Relation Age of Onset  . Colon cancer Mother   . Cancer Mother        colon  . Colon cancer Maternal Aunt   . Cancer Maternal Uncle   . Prostate cancer Maternal Uncle   . Prostate cancer Maternal Uncle   . Other Maternal Uncle   . Cancer Sister        kidney  . Cancer Brother        lymph node  . Anesthesia problems Neg Hx   . Hypotension Neg Hx   . Malignant hyperthermia Neg Hx   . Pseudochol deficiency Neg Hx   . Breast cancer Neg Hx      Prior to Admission medications   Medication Sig Start Date End Date Taking? Authorizing Provider  amLODipine (NORVASC) 5 MG tablet TAKE 1 TABLET BY MOUTH DAILY Patient taking differently: Take 5 mg by mouth daily. 11/07/19  Yes Burnell Blanks, MD  apixaban (ELIQUIS) 5 MG TABS tablet Take 1 tablet (5 mg total) by mouth 2 (two) times daily. 10/30/19  Yes Burnell Blanks, MD  furosemide (LASIX) 20 MG tablet Take 1 tablet (20 mg total) by mouth daily. 08/26/19  Yes Burnell Blanks, MD  HYDROcodone-acetaminophen (NORCO) 5-325 MG tablet Take 1 tablet by mouth every 6 (six) hours as needed. 03/26/20  Yes Jacqlyn Larsen, PA-C  metoprolol tartrate (LOPRESSOR) 25 MG tablet Take 25 mg by mouth 3 (three) times daily. MAY TAKE ONE EXTRA TABLET FOR HEART RATE OVER 100.   Yes [provider]  montelukast (SINGULAIR) 10 MG tablet TAKE 1 TABLET(10 MG) BY MOUTH AT BEDTIME Patient taking differently: Take 10 mg by mouth at bedtime. 02/28/20  Yes Koberlein, Steele Berg, MD  Multiple Vitamins-Minerals (PRESERVISION AREDS 2 PO) Take  2 capsules by mouth daily.   Yes [provider]  Multiple Vitamins-Minerals (ZINC PO) Take 1 tablet by mouth every evening.   Yes [provider]  pantoprazole (PROTONIX) 40 MG tablet TAKE 1 TABLET(40 MG) BY MOUTH TWICE DAILY Patient taking differently: Take 40 mg by mouth 2 (two) times daily. 02/15/20  Yes Noralyn Pick, NP  traZODone (DESYREL) 50 MG tablet Take 0.5-1 tablets (25-50 mg total) by mouth at bedtime as needed for sleep. Patient taking differently: Take 50 mg by mouth at bedtime as needed for sleep. 02/19/20  Yes Caren Macadam, MD    Physical Exam: Vitals:   04/02/20 1515 04/02/20 1530 04/02/20 1545 04/02/20 1600  BP: 114/76 114/71 (!) 123/105 125/65  Pulse: 85 88 95 99  Resp: (!) 23 20 (!) 23 (!) 21  Temp:      TempSrc:      SpO2: 97% 97% 97% 98%    Constitutional: NAD, calm, comfortable Vitals:   04/02/20 1515 04/02/20 1530 04/02/20 1545 04/02/20 1600  BP: 114/76 114/71 (!) 123/105 125/65  Pulse: 85 88 95 99  Resp: (!) 23 20 (!) 23 (!) 21  Temp:      TempSrc:      SpO2: 97% 97% 97% 98%   Eyes: PERRL, lids and conjunctivae normal ENMT: Mucous membranes are dry. Posterior pharynx clear of any exudate or lesions.Normal dentition.  Neck: normal, supple, no masses, no  thyromegaly Respiratory: clear to auscultation bilaterally, no wheezing, no crackles. Normal respiratory effort. No accessory muscle use.  Cardiovascular: Regular rate and rhythm, no murmurs / rubs / gallops. No extremity edema. 2+ pedal pulses. No carotid bruits.  Abdomen: no tenderness, no masses palpated. No hepatosplenomegaly. Bowel sounds positive.  Musculoskeletal: no clubbing / cyanosis. No joint deformity upper and lower extremities. Good ROM, no contractures. Normal muscle tone. Severe lower back tenderness on midline about L5-S1 level. Skin: no rashes, lesions, ulcers. No induration Neurologic: CN 2-12 grossly intact. Sensation intact, DTR normal. Strength 5/5 in all 4.  Psychiatric: Normal judgment and insight. Alert and oriented x 3. Normal mood.     Labs on Admission: I have personally reviewed following labs and imaging studies  CBC: Recent Labs  Lab 04/02/20 1133  WBC 50.6*  HGB 14.9  HCT 46.1*  MCV 96.6  PLT 035   Basic Metabolic Panel: Recent Labs  Lab 04/02/20 1133  NA 136  K 3.7  CL 99  CO2 23  GLUCOSE 134*  BUN 31*  CREATININE 1.45*  CALCIUM 8.9   GFR: Estimated Creatinine Clearance: 33.3 mL/min (A) (by C-G formula based on SCr of 1.45 mg/dL (H)). Liver Function Tests: Recent Labs  Lab 04/02/20 1133  AST 35  ALT 39  ALKPHOS 105  BILITOT 1.4*  PROT 5.5*  ALBUMIN 3.0*   No results for input(s): LIPASE, AMYLASE in the last 168 hours. No results for input(s): AMMONIA in the last 168 hours. Coagulation Profile: No results for input(s): INR, PROTIME in the last 168 hours. Cardiac Enzymes: No results for input(s): CKTOTAL, CKMB, CKMBINDEX, TROPONINI in the last 168 hours. BNP (last 3 results) No results for input(s): PROBNP in the last 8760 hours. HbA1C: No results for input(s): HGBA1C in the last 72 hours. CBG: Recent Labs  Lab 04/02/20 1453  GLUCAP 122*   Lipid Profile: No results for input(s): CHOL, HDL, LDLCALC, TRIG, CHOLHDL,  LDLDIRECT in the last 72 hours. Thyroid Function Tests: No results for input(s): TSH, T4TOTAL, FREET4, T3FREE, THYROIDAB in the  last 72 hours. Anemia Panel: No results for input(s): VITAMINB12, FOLATE, FERRITIN, TIBC, IRON, RETICCTPCT in the last 72 hours. Urine analysis:    Component Value Date/Time   COLORURINE YELLOW 04/02/2020 1455   APPEARANCEUR CLOUDY (A) 04/02/2020 1455   LABSPEC 1.012 04/02/2020 1455   PHURINE 5.0 04/02/2020 1455   GLUCOSEU NEGATIVE 04/02/2020 1455   GLUCOSEU NEGATIVE 12/26/2011 1110   HGBUR LARGE (A) 04/02/2020 1455   BILIRUBINUR NEGATIVE 04/02/2020 1455   BILIRUBINUR N 04/19/2017 1604   KETONESUR NEGATIVE 04/02/2020 1455   PROTEINUR 30 (A) 04/02/2020 1455   UROBILINOGEN 0.2 04/19/2017 1604   UROBILINOGEN 0.2 12/26/2011 1110   NITRITE NEGATIVE 04/02/2020 1455   LEUKOCYTESUR LARGE (A) 04/02/2020 1455    Radiological Exams on Admission: DG Chest 2 View  Result Date: 04/02/2020 CLINICAL DATA:  Hypotension EXAM: CHEST - 2 VIEW COMPARISON:  03/26/2020 FINDINGS: Stable cardiomegaly. Loop recorder device is in place. Chronically coarsened interstitial markings bilaterally. Aeration of the lung fields has slightly improved from prior. No new focal airspace consolidation. No pleural effusion or pneumothorax. IMPRESSION: 1. Slightly improved aeration of the lung fields bilaterally. 2. Chronic interstitial lung changes. Electronically Signed   By: Davina Poke D.O.   On: 04/02/2020 13:43   CT Head Wo Contrast  Result Date: 04/02/2020 CLINICAL DATA:  Fall this morning.  Hypotensive. EXAM: CT HEAD WITHOUT CONTRAST TECHNIQUE: Contiguous axial images were obtained from the base of the skull through the vertex without intravenous contrast. COMPARISON:  05/30/2018 FINDINGS: Brain: No mass lesion, hemorrhage, hydrocephalus, acute infarct, intra-axial, or extra-axial fluid collection. Vascular: Intracranial atherosclerosis. Skull: No significant soft tissue swelling.  Hyperostosis frontalis interna. No skull fracture. Sinuses/Orbits: Normal imaged portions of the orbits and globes. Fluid in both maxillary sinuses. Clear mastoid air cells. Other: None. IMPRESSION: 1.  No acute intracranial abnormality. 2. Sinus disease. Electronically Signed   By: Abigail Miyamoto M.D.   On: 04/02/2020 14:44    EKG: Independently reviewed.  Chronic A. fib  Assessment/Plan Active Problems:   Fall at home, initial encounter   Sepsis [facility])  (please populate well all problems here in Problem List. (For example, if patient is on BP meds at home and you resume or decide to hold them, it is a problem that needs to be her. Same for CAD, COPD, HLD and so on)  Sepsis -UA showed large amount of leukocytes, compatible with UTI.  Patient received vancomycin and cefepime in ED, will de-escalate to ceftriaxone. -Continue IV fluids for today. -Admitted to PCU for close monitoring. -Hold BP meds including Lasix and amlodipine.  Severe back pain -CT lumbar scan to rule out any fracture or bleed  Chronic A. Fib -Continue Eliquis if CT lumbar negative -Continue metoprolol  AKI -Prerenal, likely from sepsis -Received IV bolus x3 L, continue maintenance IV fluid for 1 day.  Leukocytosis -Probably a combination of recent steroid use and sepsis -Differential pending, repeat CBC tomorrow.  Chronic diastolic CHF -Hypovolemia, hold Lasix  BPV -Unsure whether today's event caused by BPV flareup.  PT evaluation once out of sepsis.  DVT prophylaxis: Eliquis  code Status: Full Code Family Communication: Daughter at bedside Disposition Plan: Expect 2 to 3 days hospital stay, to treat sepsis AKI and PT evaluation. Consults called: None Admission status: PCU   Lequita Halt MD [facility] Pager [phone]  04/02/2020, 4:30 PM

## 2020-04-02 NOTE — ED Triage Notes (Signed)
Pt in with c/o fall this morning, not sure if she hit her head.  Also c/o hypotension & SOB , states when her BP was checked it was 88/66  Pt recently tested positive for covid

## 2020-04-02 NOTE — ED Triage Notes (Signed)
Pt here from [facility] with reports of fall this morning. Pt unsure how she fell. Pt does take eliquis. Pt hypotensive 82/59 in triage. No head trauma noted.

## 2020-04-02 NOTE — ED Notes (Signed)
Pt placed on purewick 

## 2020-04-02 NOTE — ED Notes (Signed)
Pt and pts family would like to be transferred to [facility] in [place]. Dr. Marlyce Huge made aware. Transfer concerns will be addressed in the morning.

## 2020-04-03 LAB — BLOOD CULTURE ID PANEL (REFLEXED) - BCID2

## 2020-04-03 LAB — CBC
HCT: 40.5 % (ref 36.0–46.0)
Hemoglobin: 13.3 g/dL (ref 12.0–15.0)
MCH: 31 pg (ref 26.0–34.0)
MCHC: 32.8 g/dL (ref 30.0–36.0)
MCV: 94.4 fL (ref 80.0–100.0)
Platelets: 96 10*3/uL — ABNORMAL LOW (ref 150–400)
RBC: 4.29 MIL/uL (ref 3.87–5.11)
RDW: 15.1 % (ref 11.5–15.5)
WBC: 36.8 10*3/uL — ABNORMAL HIGH (ref 4.0–10.5)
nRBC: 0 % (ref 0.0–0.2)

## 2020-04-03 LAB — BASIC METABOLIC PANEL
Anion gap: 10 (ref 5–15)
BUN: 23 mg/dL (ref 8–23)
CO2: 25 mmol/L (ref 22–32)
Calcium: 8.5 mg/dL — ABNORMAL LOW (ref 8.9–10.3)
Chloride: 104 mmol/L (ref 98–111)
Creatinine, Ser: 0.96 mg/dL (ref 0.44–1.00)
GFR, Estimated: 59 mL/min — ABNORMAL LOW (ref >60)
Glucose, Bld: 143 mg/dL — ABNORMAL HIGH (ref 70–99)
Potassium: 3.3 mmol/L — ABNORMAL LOW (ref 3.5–5.1)
Sodium: 139 mmol/L (ref 135–145)

## 2020-04-03 LAB — SARS CORONAVIRUS 2 (TAT 6-24 HRS): SARS Coronavirus 2: POSITIVE — AB

## 2020-04-03 MED ORDER — SODIUM CHLORIDE 0.9 % IV SOLN
2.0000 g | INTRAVENOUS | Status: DC
  Administered 2020-04-03 – 2020-04-04 (×2): 2 g via INTRAVENOUS
  Filled 2020-04-03 (×2): qty 20

## 2020-04-03 MED ORDER — POTASSIUM CHLORIDE CRYS ER 20 MEQ PO TBCR
40.0000 meq | EXTENDED_RELEASE_TABLET | Freq: Once | ORAL | Status: AC
  Administered 2020-04-03: 40 meq via ORAL
  Filled 2020-04-03: qty 2

## 2020-04-03 NOTE — Progress Notes (Addendum)
[facility]  PROGRESS NOTE  Selena [name] XVQ:[id number] DOB: 07-22-1935 DOA: 04/02/2020 PCP: Caren Macadam, MD   Brief HPI:   85 year old female with medical history of recent COVID anterior infection, treated in hospital with IV remdesivir for 3 days, discharged on 03/10/2020, chronic atrial fibrillation on Eliquis, chronic diastolic CHF, hypertension, BPPV, remote history of non-Hodgkin lymphoma presented with a fall.  Patient states she was sitting at the side of bed and may have slipped down to the floor.  Patient says she has been feeling weak since yesterday denies any numbness or weakness of the limbs.  No nausea vomiting or diarrhea.  She went to urgent care at that time she was found to have hypotension and was sent to the ED to rule out intracranial bleeding as patient is on Eliquis.  1 week ago on 03/26/2020 she came to ED with new onset right-sided chest pain and shortness of breath, CTA was done which was negative for PE.  ED physician diagnosed her with pleural inflammation and sent her home on 7-day course of steroid which patient had completed yesterday. In the ED CT head was negative for any finding of acute bleeding.  Outpatient follow-up shortness of breath and hypotension, blood pressure was 88/66.  Blood work showed WBC of greater than 50,000. Also in the ED she started complaining of severe back pain 10/10 in intensity, denied any trouble with leg movement or numbness of legs.    Subjective   Patient seen and examined, she does complain of intermittent dysuria.  UA was found to be abnormal, she was started on vancomycin and cefepime empirically for sepsis.  WBC is down to 36,000.  CT of lumbar spine showed advanced chronic degenerative disc disease and degenerative facet disease throughout the lumbar region.   Assessment/Plan:     1. Severe sepsis due to UTI-patient presented with sepsis physiology, was found to have abnormal UA.  WBC initially was 50,000,  likely combination of sepsis from UTI as well as prednisone therapy which she was prescribed a week ago.  WBC is down to 36,000 today.  Patient was empirically started on vancomycin and ceftriaxone.  Blood cultures growing E. coli, continue ceftriaxone at this time.  Vancomycin has been discontinued.  2. E. coli bacteremia-BC ID, positive for E. coli.  Started on ceftriaxone as above.  Follow culture results.  3. Back pain-CT lumbar spine showed advanced chronic degenerative disc disease and degenerative facet disease throughout the lumbar region.  I called and discussed with neurosurgeon on-call Dr. Saintclair Halsted, he reviewed the CT scan and recommends outpatient follow-up.  Will obtain PT/OT evaluation.  Continue Vicodin as needed, continue Dilaudid 0.5 mg IV every 4 hours as needed.  4. History of chronic atrial fibrillation-heart rate is controlled, continue anticoagulation with Eliquis.  Continue metoprolol 25 mg p.o. 3 times daily for rate control.  5. GERD-continue Protonix 40 mg p.o. twice daily.  6. Acute kidney injury-likely from poor p.o. intake, improved after holding Lasix.  Patient also received IV fluid bolus in the ED.  Creatinine is 0.96.  7. Hypokalemia-potassium is 3.3, replace potassium and follow BMP in am.  8. Recent COVID-19 infection-she was treated with IV remdesivir for 3 days in the hospital, discharged home on 03/10/2020.  Chest x-ray obtained yesterday shows improved aeration of lung fields bilaterally.  Patient is currently not requiring oxygen.  No need of airborne precautions.     COVID-19 Labs  No results for input(s): DDIMER, FERRITIN, LDH, CRP in the  last 72 hours.  Lab Results  Component Value Date   SARSCOV2NAA POSITIVE (A) 04/02/2020   [place] NEGATIVE 05/06/2019     Scheduled medications:   . apixaban  5 mg Oral BID  . metoprolol tartrate  25 mg Oral TID  . montelukast  10 mg Oral QHS  . pantoprazole  40 mg Oral BID         CBG: Recent Labs   Lab 04/02/20 1453  GLUCAP 122*    SpO2: 95 %    CBC: Recent Labs  Lab 04/02/20 1133 04/03/20 0253  WBC 50.6* 36.8*  HGB 14.9 13.3  HCT 46.1* 40.5  MCV 96.6 94.4  PLT 164 96*    Basic Metabolic Panel: Recent Labs  Lab 04/02/20 1133 04/03/20 0253  NA 136 139  K 3.7 3.3*  CL 99 104  CO2 23 25  GLUCOSE 134* 143*  BUN 31* 23  CREATININE 1.45* 0.96  CALCIUM 8.9 8.5*     Liver Function Tests: Recent Labs  Lab 04/02/20 1133  AST 35  ALT 39  ALKPHOS 105  BILITOT 1.4*  PROT 5.5*  ALBUMIN 3.0*     Antibiotics: Anti-infectives (From admission, onward)   Start     Dose/Rate Route Frequency Ordered Stop   04/02/20 1930  cefTRIAXone (ROCEPHIN) 2 g in sodium chloride 0.9 % 100 mL IVPB        2 g 200 mL/hr over 30 Minutes Intravenous Every 24 hours 04/02/20 1641 04/07/20 1929   04/02/20 1602  vancomycin variable dose per unstable renal function (pharmacist dosing)  Status:  Discontinued         Does not apply See admin instructions 04/02/20 1603 04/02/20 1641   04/02/20 1430  vancomycin (VANCOREADY) IVPB 1750 mg/350 mL        1,750 mg 175 mL/hr over 120 Minutes Intravenous  Once 04/02/20 1332 04/02/20 1727   04/02/20 1415  ceFEPIme (MAXIPIME) 2 g in sodium chloride 0.9 % 100 mL IVPB  Status:  Discontinued        2 g 200 mL/hr over 30 Minutes Intravenous Every 12 hours 04/02/20 1410 04/02/20 1641   04/02/20 1345  ceFEPIme (MAXIPIME) 2 g in sodium chloride 0.9 % 100 mL IVPB  Status:  Discontinued        2 g 200 mL/hr over 30 Minutes Intravenous  Once 04/02/20 1332 04/02/20 1410   04/02/20 1345  metroNIDAZOLE (FLAGYL) IVPB 500 mg        500 mg 100 mL/hr over 60 Minutes Intravenous  Once 04/02/20 1332 04/02/20 1510       DVT prophylaxis: Apixaban  Code Status: Full code  Family Communication: Discussed with patient's son at bedside   Consultants:    Procedures:      Objective   Vitals:   04/03/20 1400 04/03/20 1431 04/03/20 1455 04/03/20 1505   BP: (!) 104/59   111/68  Pulse: 90   73  Resp: (!) 31   19  Temp:  98.4 F (36.9 C)  98 F (36.7 C)  TempSrc:  Oral  Oral  SpO2: 95%   95%  Weight:   88 kg   Height:   5\' 8"  (1.727 m)    No intake or output data in the 24 hours ending 04/03/20 1528  No intake/output data recorded.  Filed Weights   04/03/20 1455  Weight: 88 kg    Physical Examination:   General-appears in no acute distress Heart-S1-S2, regular, no murmur auscultated Lungs-clear to auscultation bilaterally,  no wheezing or crackles auscultated Abdomen-soft, nontender, no organomegaly Extremities-no edema in the lower extremities Neuro-alert, oriented x3, no focal deficit noted, motor strength 5/5 in both upper and lower extremities, sensation is intact bilaterally  Status is: Inpatient  Dispo: The patient is from: Home              Anticipated d/c is to: Home              Anticipated d/c date is: 04/07/2020              Patient currently not stable for discharge  Barrier to discharge-sepsis due to UTI            Data Reviewed:   Recent Results (from the past 240 hour(s))  Blood culture (routine x 2)     Status: None (Preliminary result)   Collection Time: 04/02/20 12:56 PM   Specimen: BLOOD  Result Value Ref Range Status   Specimen Description BLOOD LEFT ANTECUBITAL  Final   Special Requests   Final    BOTTLES DRAWN AEROBIC AND ANAEROBIC Blood Culture adequate volume   Culture  Setup Time   Final    IN BOTH AEROBIC AND ANAEROBIC BOTTLES GRAM NEGATIVE RODS CRITICAL VALUE NOTED.  VALUE IS CONSISTENT WITH PREVIOUSLY REPORTED AND CALLED VALUE. Performed at [facility], [place] [address] [place], [place] [address]    Culture GRAM NEGATIVE RODS  Final   Report Status PENDING  Incomplete  Blood culture (routine x 2)     Status: None (Preliminary result)   Collection Time: 04/02/20  1:01 PM   Specimen: BLOOD LEFT FOREARM  Result Value Ref Range Status   Specimen Description BLOOD LEFT  FOREARM  Final   Special Requests   Final    BOTTLES DRAWN AEROBIC AND ANAEROBIC Blood Culture adequate volume   Culture  Setup Time   Final    IN BOTH AEROBIC AND ANAEROBIC BOTTLES GRAM NEGATIVE RODS CRITICAL RESULT CALLED TO, READ BACK BY AND VERIFIED WITH: T,JACKY PHARMD @0834  04/03/20 EB Performed at [facility], [place] [address] [place], [place] [address]    Culture GRAM NEGATIVE RODS  Final   Report Status PENDING  Incomplete  Blood Culture ID Panel (Reflexed)     Status: Abnormal   Collection Time: 04/02/20  1:01 PM  Result Value Ref Range Status   Enterococcus faecalis NOT DETECTED NOT DETECTED Final   Enterococcus Faecium NOT DETECTED NOT DETECTED Final   Listeria monocytogenes NOT DETECTED NOT DETECTED Final   Staphylococcus species NOT DETECTED NOT DETECTED Final   Staphylococcus aureus (BCID) NOT DETECTED NOT DETECTED Final   Staphylococcus epidermidis NOT DETECTED NOT DETECTED Final   Staphylococcus lugdunensis NOT DETECTED NOT DETECTED Final   Streptococcus species NOT DETECTED NOT DETECTED Final   Streptococcus agalactiae NOT DETECTED NOT DETECTED Final   Streptococcus pneumoniae NOT DETECTED NOT DETECTED Final   Streptococcus pyogenes NOT DETECTED NOT DETECTED Final   A.calcoaceticus-baumannii NOT DETECTED NOT DETECTED Final   Bacteroides fragilis NOT DETECTED NOT DETECTED Final   Enterobacterales DETECTED (A) NOT DETECTED Final    Comment: Enterobacterales represent a large order of gram negative bacteria, not a single organism. CRITICAL RESULT CALLED TO, READ BACK BY AND VERIFIED WITH: T,JACKY PHARMD @0834  04/03/20 EB    Enterobacter cloacae complex NOT DETECTED NOT DETECTED Final   Escherichia coli DETECTED (A) NOT DETECTED Final    Comment: CRITICAL RESULT CALLED TO, READ BACK BY AND VERIFIED WITH: T,JACKY PHARMD @0834   04/03/20 EB    Klebsiella aerogenes NOT DETECTED NOT DETECTED Final   Klebsiella oxytoca NOT DETECTED NOT DETECTED Final   Klebsiella  pneumoniae NOT DETECTED NOT DETECTED Final   Proteus species NOT DETECTED NOT DETECTED Final   Salmonella species NOT DETECTED NOT DETECTED Final   Serratia marcescens NOT DETECTED NOT DETECTED Final   Haemophilus influenzae NOT DETECTED NOT DETECTED Final   Neisseria meningitidis NOT DETECTED NOT DETECTED Final   Pseudomonas aeruginosa NOT DETECTED NOT DETECTED Final   Stenotrophomonas maltophilia NOT DETECTED NOT DETECTED Final   Candida albicans NOT DETECTED NOT DETECTED Final   Candida auris NOT DETECTED NOT DETECTED Final   Candida glabrata NOT DETECTED NOT DETECTED Final   Candida krusei NOT DETECTED NOT DETECTED Final   Candida parapsilosis NOT DETECTED NOT DETECTED Final   Candida tropicalis NOT DETECTED NOT DETECTED Final   Cryptococcus neoformans/gattii NOT DETECTED NOT DETECTED Final   CTX-M ESBL NOT DETECTED NOT DETECTED Final   Carbapenem resistance IMP NOT DETECTED NOT DETECTED Final   Carbapenem resistance KPC NOT DETECTED NOT DETECTED Final   Carbapenem resistance NDM NOT DETECTED NOT DETECTED Final   Carbapenem resist OXA 48 LIKE NOT DETECTED NOT DETECTED Final   Carbapenem resistance VIM NOT DETECTED NOT DETECTED Final    Comment: Performed at [facility], [place] [address] [place], [place] [address]  SARS CORONAVIRUS 2 (TAT 6-24 HRS) Nasopharyngeal Nasopharyngeal Swab     Status: Abnormal   Collection Time: 04/02/20 11:08 PM   Specimen: Nasopharyngeal Swab  Result Value Ref Range Status   SARS Coronavirus 2 POSITIVE (A) NEGATIVE Final    Comment: (NOTE) SARS-CoV-2 target nucleic acids are DETECTED.  The SARS-CoV-2 RNA is generally detectable in upper and lower respiratory specimens during the acute phase of infection. Positive results are indicative of the presence of SARS-CoV-2 RNA. Clinical correlation with patient history and other diagnostic information is  necessary to determine patient infection status. Positive results do not rule out bacterial  infection or co-infection with other viruses.  The expected result is Negative.  Fact Sheet for Patients: SugarRoll.be  Fact Sheet for Healthcare Providers: https://www.woods-mathews.com/  This test is not yet approved or cleared by the [place] FDA and  has been authorized for detection and/or diagnosis of SARS-CoV-2 by FDA under an Emergency Use Authorization (EUA). This EUA will remain  in effect (meaning this test can be used) for the duration of the COVID-19 declaration under Section 564(b)(1) of the Act, 21 U. S.C. section 360bbb-3(b)(1), unless the authorization is terminated or revoked sooner.   Performed at [facility], [place] [address] [place], [place] [address]     No results for input(s): LIPASE, AMYLASE in the last 168 hours. No results for input(s): AMMONIA in the last 168 hours.  Cardiac Enzymes: No results for input(s): CKTOTAL, CKMB, CKMBINDEX, TROPONINI in the last 168 hours. BNP (last 3 results) Recent Labs    03/08/20 1141  BNP 158.0*    ProBNP (last 3 results) No results for input(s): PROBNP in the last 8760 hours.  Studies:  DG Chest 2 View  Result Date: 04/02/2020 CLINICAL DATA:  Hypotension EXAM: CHEST - 2 VIEW COMPARISON:  03/26/2020 FINDINGS: Stable cardiomegaly. Loop recorder device is in place. Chronically coarsened interstitial markings bilaterally. Aeration of the lung fields has slightly improved from prior. No new focal airspace consolidation. No pleural effusion or pneumothorax. IMPRESSION: 1. Slightly improved aeration of the lung fields bilaterally. 2. Chronic interstitial lung changes. Electronically  Signed   By: Davina Poke D.O.   On: 04/02/2020 13:43   CT Head Wo Contrast  Result Date: 04/02/2020 CLINICAL DATA:  Fall this morning.  Hypotensive. EXAM: CT HEAD WITHOUT CONTRAST TECHNIQUE: Contiguous axial images were obtained from the base of the skull through the vertex without  intravenous contrast. COMPARISON:  05/30/2018 FINDINGS: Brain: No mass lesion, hemorrhage, hydrocephalus, acute infarct, intra-axial, or extra-axial fluid collection. Vascular: Intracranial atherosclerosis. Skull: No significant soft tissue swelling. Hyperostosis frontalis interna. No skull fracture. Sinuses/Orbits: Normal imaged portions of the orbits and globes. Fluid in both maxillary sinuses. Clear mastoid air cells. Other: None. IMPRESSION: 1.  No acute intracranial abnormality. 2. Sinus disease. Electronically Signed   By: Abigail Miyamoto M.D.   On: 04/02/2020 14:44   CT LUMBAR SPINE WO CONTRAST  Result Date: 04/02/2020 CLINICAL DATA:  Low back pain after a fall. Increased fracture risk. EXAM: CT LUMBAR SPINE WITHOUT CONTRAST TECHNIQUE: Multidetector CT imaging of the lumbar spine was performed without intravenous contrast administration. Multiplanar CT image reconstructions were also generated. COMPARISON:  11/18/2019 FINDINGS: Segmentation: 5 lumbar type vertebral bodies. Alignment: No traumatic malalignment. Chronic degenerative anterolisthesis at L5-S1 of 3 mm. Vertebrae: No regional fracture or primary bone lesion. Paraspinal and other soft tissues: Aortic atherosclerosis as seen previously. Disc levels: T12-L1: Solid bridging osteophytes.  No stenosis. L1-2: Chronic disc degeneration with vacuum phenomenon. Bulging of the disc with endplate osteophytes. Mild facet osteoarthritis. No compressive central canal stenosis. Mild lateral recess narrowing. L2-3: Chronic disc degeneration with vacuum phenomenon. Endplate osteophytes and bulging of the disc. Bilateral facet osteoarthritis. Mild stenosis of the lateral recesses and foramina. L3-4: Chronic disc degeneration with vacuum phenomenon. Endplate osteophytes and bulging of the disc. Facet and ligamentous hypertrophy. Severe multifactorial stenosis that could cause neural compression on either or both sides. L4-5: Chronic disc degeneration with vacuum  phenomenon. Endplate osteophytes and bulging of the disc. Facet arthropathy with facet and ligamentous hypertrophy. Chronic stenosis of the canal, lateral recesses and neural foramina that could cause neural compression on either or both sides. L5-S1: Chronic facet arthropathy with 3 mm of anterolisthesis. Degeneration and bulging of the disc. Mild bilateral subarticular lateral recess stenosis. Bilateral foraminal encroachment by osteophyte and disc material could affect either L5 nerve. IMPRESSION: 1. Advanced chronic degenerative disc disease and degenerative facet disease throughout the lumbar region as outlined above. Findings are similar to the study of 11/18/2019. At L3-4 there is severe multifactorial spinal stenosis that could cause neural compression on either or both sides. At L4-5, there is chronic stenosis of the canal, lateral recesses and neural foramina that could cause neural compression on either or both sides. At L5-S1, there is bilateral foraminal encroachment by osteophyte and disc material that could affect either L5 nerve. 2. No acute fracture.  No traumatic malalignment. Aortic Atherosclerosis (ICD10-I70.0). Electronically Signed   By: Nelson Chimes M.D.   On: 04/02/2020 17:44       [place]   [facility] If 7PM-7AM, please contact night-coverage at [email or web address], Office  [phone]   04/03/2020, 3:28 PM  LOS: 1 day

## 2020-04-03 NOTE — ED Notes (Signed)
Contacted pt placement about positive covid test. Advised to disregard positive results due to pt testing positive for covid on 03/08/20. Pt is out of the window and asymptomatic.

## 2020-04-03 NOTE — ED Notes (Signed)
Lunch Tray Ordered @ 1001. 

## 2020-04-03 NOTE — Evaluation (Signed)
Physical Therapy Evaluation Patient Details Name: Selena [name] MRN: [id number] DOB: March 18, 1935 Today's Date: 04/03/2020   History of Present Illness  85 y.o. female was brought to ED for onset of hypotension at home that urgent care sent on.  Had recent stay in Feb 2022 for Covid PNA, has new sepsis UTI and e coli bacteremia.  New AKI with low K+, with back pain from chronic degenerative changes.  PMHx:  Covid PNA, CHF, a-fib, HTN, OA, memory loss, BPPV  Clinical Impression  Pt was evaluated after sustaining a fall out of bed at home and finding she had low BP, in which she came to hosp for assessment.  Her son is in attendance, and is able to give information that indicates he is very involved with her care.  Pt is able to walk a bit but normally lives alone and walks safely and independently.  Given her decline of tolerance and changes functionally will expect her to need SNF for a short stay and then to go home with therapy as well.  Pt is not orthostatic, as assessed by PT:  Supine 108/60, sitting 102/78 and standing 124/58.  While not outright low, she may be lower than is typical for her.  Follow acutely for progressing gait and transfers to increase strength and tolerances for standing endurance, balance and LE strength for safety with gait.  Will need to try stairs if she refuses SNF.    Follow Up Recommendations SNF    Equipment Recommendations  None recommended by PT    Recommendations for Other Services       Precautions / Restrictions Precautions Precautions: Fall Precaution Comments: monitor BP Restrictions Weight Bearing Restrictions: No      Mobility  Bed Mobility Overal bed mobility: Needs Assistance Bed Mobility: Supine to Sit;Sit to Supine     Supine to sit: Min assist Sit to supine: Min assist   General bed mobility comments: pt is up to side of bed with min assist for trunk and then back with min for LE's    Transfers Overall transfer level: Needs  assistance Equipment used: Rolling walker (2 wheeled);1 person hand held assist Transfers: Sit to/from Stand Sit to Stand: Min assist            Ambulation/Gait Ambulation/Gait assistance: Min assist Gait Distance (Feet): 8 Feet Assistive device: Rolling walker (2 wheeled);1 person hand held assist Gait Pattern/deviations: Step-to pattern;Decreased stride length;Wide base of support Gait velocity: reduced Gait velocity interpretation: <1.31 ft/sec, indicative of household ambulator General Gait Details: sidesteps as pt is feeling poorly  Science writer    Modified Rankin (Stroke Patients Only)       Balance Overall balance assessment: Needs assistance Sitting-balance support: Feet supported Sitting balance-Leahy Scale: Good   Postural control: Posterior lean Standing balance support: Bilateral upper extremity supported;During functional activity Standing balance-Leahy Scale: Poor                               Pertinent Vitals/Pain Pain Assessment: Faces Faces Pain Scale: Hurts little more Pain Location: spine Pain Descriptors / Indicators: Guarding Pain Intervention(s): Monitored during session;Repositioned    Home Living Family/patient expects to be discharged to:: Private residence Living Arrangements: Alone Available Help at Discharge: Family;Available 24 hours/day Type of Home: House Home Access: Stairs to enter Entrance Stairs-Rails: None (uses car door per pt and son) Technical brewer  of Steps: 2 Home Layout: One level Home Equipment: Cane - single point;Walker - 2 wheels Additional Comments: Son and daughter live on either side of pt and can assist if needed    Prior Function Level of Independence: Independent with assistive device(s)         Comments: Mod indep with SPC due to h/o vertigo. Drives. Mod indep with household tasks (reports some difficulty sweeping)     Hand Dominance   Dominant  Hand: Right    Extremity/Trunk Assessment   Upper Extremity Assessment Upper Extremity Assessment: Overall WFL for tasks assessed    Lower Extremity Assessment Lower Extremity Assessment: Generalized weakness    Cervical / Trunk Assessment Cervical / Trunk Assessment: Kyphotic  Communication   Communication: HOH  Cognition Arousal/Alertness: Awake/alert Behavior During Therapy: WFL for tasks assessed/performed Overall Cognitive Status: Within Functional Limits for tasks assessed                                        General Comments General comments (skin integrity, edema, etc.): pt rquires active support for extended standing with cues for posture, check of BP was not indicative of orthostatic hypotension    Exercises     Assessment/Plan    PT Assessment Patient needs continued PT services  PT Problem List Decreased strength;Decreased range of motion;Decreased activity tolerance;Decreased balance;Decreased mobility;Decreased coordination;Cardiopulmonary status limiting activity       PT Treatment Interventions DME instruction;Gait training;Stair training;Functional mobility training;Therapeutic activities;Therapeutic exercise;Balance training;Neuromuscular re-education;Patient/family education    PT Goals (Current goals can be found in the Care Plan section)  Acute Rehab PT Goals Patient Stated Goal: to get home and get stronger PT Goal Formulation: With patient/family Time For Goal Achievement: 04/17/20 Potential to Achieve Goals: Good    Frequency Min 3X/week   Barriers to discharge Inaccessible home environment;Decreased caregiver support has family but do not stay with her and are working    Co-evaluation               AM-PAC PT "6 Clicks" Mobility  Outcome Measure Help needed turning from your back to your side while in a flat bed without using bedrails?: A Little Help needed moving from lying on your back to sitting on the side of a  flat bed without using bedrails?: A Little Help needed moving to and from a bed to a chair (including a wheelchair)?: A Little Help needed standing up from a chair using your arms (e.g., wheelchair or bedside chair)?: A Little Help needed to walk in hospital room?: A Lot Help needed climbing 3-5 steps with a railing? : Total 6 Click Score: 15    End of Session Equipment Utilized During Treatment: Gait belt Activity Tolerance: Patient tolerated treatment well;Patient limited by fatigue Patient left: in bed;with call bell/phone within reach;with bed alarm set;with family/visitor present Nurse Communication: Mobility status PT Visit Diagnosis: Unsteadiness on feet (R26.81);Muscle weakness (generalized) (M62.81)    Time: 0370-4888 PT Time Calculation (min) (ACUTE ONLY): 32 min   Charges:   PT Evaluation $PT Eval Moderate Complexity: 1 Mod PT Treatments $Gait Training: 8-22 mins       Ramond Dial 04/03/2020, 6:15 PM Mee Hives, PT MS Acute Rehab Dept. Number: [facility]

## 2020-04-03 NOTE — Progress Notes (Signed)
PHARMACY - PHYSICIAN COMMUNICATION CRITICAL VALUE ALERT - BLOOD CULTURE IDENTIFICATION (BCID)  Selena [name] is an 85 y.o. female who presented to [facility] on 04/02/2020 after fall with hypotension. Admitted with concern for sepsis of unknown source.   Assessment: Blood cultures growing GNRs in 4/4 bottles. BCID reporting E. Coli with no resistance detected.   Name of physician (or Provider) Contacted: Eleonore Chiquito, MD  Current antibiotics: Ceftriaxone 2g IV q24h   Changes to prescribed antibiotics recommended:  Patient is on recommended antibiotics - No changes needed  Results for orders placed or performed during the hospital encounter of 04/02/20  Blood Culture ID Panel (Reflexed) (Collected: 04/02/2020  1:01 PM)  Result Value Ref Range   Enterococcus faecalis NOT DETECTED NOT DETECTED   Enterococcus Faecium NOT DETECTED NOT DETECTED   Listeria monocytogenes NOT DETECTED NOT DETECTED   Staphylococcus species NOT DETECTED NOT DETECTED   Staphylococcus aureus (BCID) NOT DETECTED NOT DETECTED   Staphylococcus epidermidis NOT DETECTED NOT DETECTED   Staphylococcus lugdunensis NOT DETECTED NOT DETECTED   Streptococcus species NOT DETECTED NOT DETECTED   Streptococcus agalactiae NOT DETECTED NOT DETECTED   Streptococcus pneumoniae NOT DETECTED NOT DETECTED   Streptococcus pyogenes NOT DETECTED NOT DETECTED   A.calcoaceticus-baumannii NOT DETECTED NOT DETECTED   Bacteroides fragilis NOT DETECTED NOT DETECTED   Enterobacterales DETECTED (A) NOT DETECTED   Enterobacter cloacae complex NOT DETECTED NOT DETECTED   Escherichia coli DETECTED (A) NOT DETECTED   Klebsiella aerogenes NOT DETECTED NOT DETECTED   Klebsiella oxytoca NOT DETECTED NOT DETECTED   Klebsiella pneumoniae NOT DETECTED NOT DETECTED   Proteus species NOT DETECTED NOT DETECTED   Salmonella species NOT DETECTED NOT DETECTED   Serratia marcescens NOT DETECTED NOT DETECTED   Haemophilus influenzae NOT DETECTED NOT  DETECTED   Neisseria meningitidis NOT DETECTED NOT DETECTED   Pseudomonas aeruginosa NOT DETECTED NOT DETECTED   Stenotrophomonas maltophilia NOT DETECTED NOT DETECTED   Candida albicans NOT DETECTED NOT DETECTED   Candida auris NOT DETECTED NOT DETECTED   Candida glabrata NOT DETECTED NOT DETECTED   Candida krusei NOT DETECTED NOT DETECTED   Candida parapsilosis NOT DETECTED NOT DETECTED   Candida tropicalis NOT DETECTED NOT DETECTED   Cryptococcus neoformans/gattii NOT DETECTED NOT DETECTED   CTX-M ESBL NOT DETECTED NOT DETECTED   Carbapenem resistance IMP NOT DETECTED NOT DETECTED   Carbapenem resistance KPC NOT DETECTED NOT DETECTED   Carbapenem resistance NDM NOT DETECTED NOT DETECTED   Carbapenem resist OXA 48 LIKE NOT DETECTED NOT DETECTED   Carbapenem resistance VIM NOT DETECTED NOT DETECTED    Claudina Lick, PharmD PGY1 Acute Care Pharmacy Resident 04/03/2020 8:38 AM  Please check [email or web address] for unit-specific pharmacy phone numbers.

## 2020-04-04 DIAGNOSIS — A4151 Sepsis due to Escherichia coli [E. coli]: Principal | ICD-10-CM

## 2020-04-04 DIAGNOSIS — Y92009 Unspecified place in unspecified non-institutional (private) residence as the place of occurrence of the external cause: Secondary | ICD-10-CM

## 2020-04-04 DIAGNOSIS — N170 Acute kidney failure with tubular necrosis: Secondary | ICD-10-CM

## 2020-04-04 DIAGNOSIS — R6520 Severe sepsis without septic shock: Secondary | ICD-10-CM

## 2020-04-04 DIAGNOSIS — W19XXXA Unspecified fall, initial encounter: Secondary | ICD-10-CM

## 2020-04-04 LAB — COMPREHENSIVE METABOLIC PANEL
ALT: 25 U/L (ref 0–44)
AST: 18 U/L (ref 15–41)
Albumin: 2.3 g/dL — ABNORMAL LOW (ref 3.5–5.0)
Alkaline Phosphatase: 87 U/L (ref 38–126)
Anion gap: 8 (ref 5–15)
BUN: 20 mg/dL (ref 8–23)
CO2: 25 mmol/L (ref 22–32)
Calcium: 8.8 mg/dL — ABNORMAL LOW (ref 8.9–10.3)
Chloride: 108 mmol/L (ref 98–111)
Creatinine, Ser: 0.69 mg/dL (ref 0.44–1.00)
GFR, Estimated: >60 mL/min (ref >60)
Glucose, Bld: 120 mg/dL — ABNORMAL HIGH (ref 70–99)
Potassium: 3.6 mmol/L (ref 3.5–5.1)
Sodium: 141 mmol/L (ref 135–145)
Total Bilirubin: 0.7 mg/dL (ref 0.3–1.2)
Total Protein: 4.8 g/dL — ABNORMAL LOW (ref 6.5–8.1)

## 2020-04-04 LAB — CBC
HCT: 39.7 % (ref 36.0–46.0)
Hemoglobin: 13.1 g/dL (ref 12.0–15.0)
MCH: 31 pg (ref 26.0–34.0)
MCHC: 33 g/dL (ref 30.0–36.0)
MCV: 93.9 fL (ref 80.0–100.0)
Platelets: 91 10*3/uL — ABNORMAL LOW (ref 150–400)
RBC: 4.23 MIL/uL (ref 3.87–5.11)
RDW: 15.1 % (ref 11.5–15.5)
WBC: 21.6 10*3/uL — ABNORMAL HIGH (ref 4.0–10.5)
nRBC: 0 % (ref 0.0–0.2)

## 2020-04-04 NOTE — Plan of Care (Signed)
  Problem: Clinical Measurements: Goal: Will remain free from infection Outcome: Progressing   Problem: Safety: Goal: Ability to remain free from injury will improve Outcome: Progressing   

## 2020-04-04 NOTE — Progress Notes (Signed)
PROGRESS NOTE    Selena [name]  QZR:[id number] DOB: 1935/06/18 DOA: 04/02/2020 PCP: Caren Macadam, MD    Brief Narrative:  85 year old female with history of recent Covid infection, chronic A. fib on Eliquis, chronic diastolic heart failure, hypertension, remote history of non-Hodgkin's lymphoma presented to the hospital from home with a fall.  She was sitting at the edge of the bed and slipped down to the floor.  She has been feeling weak for the last few days.  Recently admitted to hospital for 3 days for COVID-19 pneumonia and discharged on 2/15, gone to urgent care on 3/3 with new onset right-sided pleuritic chest pain and treated with 7 days of steroid therapy.  At the emergency room, head CT was normal.  Blood pressure 88/66.  WBC more than 50,000.  Complaining of severe back pain.   Assessment & Plan:   Active Problems:   Fall at home, initial encounter   Sepsis ([place])  Severe sepsis present on admission due to E. coli bacteremia/E. coli UTI: Treated with fluids and resuscitated.  WBC trending down.  Clinically improving.  On vancomycin and ceftriaxone.  Vancomycin discontinued. Continue ceftriaxone high doses until final cultures and clinical improvement. A CT scan of the spine was done that showed degenerative disease, negative for any inflammation. No evidence of urinary retention.  Chronic atrial fibrillation: Acceptable rate control on current doses of metoprolol.  Therapeutic on Eliquis.  Acute kidney injury: Treated with IV fluids and improved.  GERD: On PPI continue.  Recent COVID-19 infection: Completely treated.  No indication to continue isolation.  Supportive therapy.  Physical debility/acute illnesses: Work with PT OT.  Anticipate she will need skilled nursing level of care before going home independent.     DVT prophylaxis:  apixaban (ELIQUIS) tablet 5 mg   Code Status: Full code Family Communication: Patient's son on the phone Disposition Plan:  Status is: Inpatient  Remains inpatient appropriate because:IV treatments appropriate due to intensity of illness or inability to take PO and Inpatient level of care appropriate due to severity of illness   Dispo: The patient is from: Home              Anticipated d/c is to: SNF              Patient currently is not medically stable to d/c.   Difficult to place patient No         Consultants:   None  Procedures:   None  Antimicrobials:  Antibiotics Given (last 72 hours)    Date/Time Action Medication Dose Rate   04/02/20 1432 New Bag/Given   ceFEPIme (MAXIPIME) 2 g in sodium chloride 0.9 % 100 mL IVPB 2 g 200 mL/hr   04/02/20 1924 New Bag/Given   cefTRIAXone (ROCEPHIN) 2 g in sodium chloride 0.9 % 100 mL IVPB 2 g 200 mL/hr   04/03/20 2206 New Bag/Given   cefTRIAXone (ROCEPHIN) 2 g in sodium chloride 0.9 % 100 mL IVPB 2 g 200 mL/hr         Subjective: Patient seen and examined.  She was feeling fluttering overnight, telemetry shows heart rate 94 to 100. A. fib.  Afebrile.  Feels weak otherwise denies any complaints.  Objective: Vitals:   04/03/20 2352 04/04/20 0420 04/04/20 0500 04/04/20 0759  BP: 140/76 (!) 136/92  (!) 140/95  Pulse: 67 73  76  Resp: 18 18  20   Temp: 97.8 F (36.6 C) 97.7 F (36.5 C)  97.6 F (36.4 C)  TempSrc: Oral Oral  Oral  SpO2: 94% 96%  92%  Weight:   88.5 kg   Height:        Intake/Output Summary (Last 24 hours) at 04/04/2020 0855 Last data filed at 04/03/2020 2300 Gross per 24 hour  Intake 277 ml  Output --  Net 277 ml   Filed Weights   04/03/20 1455 04/04/20 0500  Weight: 88 kg 88.5 kg    Examination:  General exam: Appears calm and comfortable  Chronically sick looking, frail and debilitated.  Not in any distress. Respiratory system: Clear to auscultation. Respiratory effort normal.  No added sounds. Cardiovascular system: S1 & S2 heard, irregularly irregular.  Tachycardic.   Gastrointestinal system: Soft.   Nontender. Central nervous system: Alert and oriented. No focal neurological deficits. Extremities: Symmetric 5 x 5 power.  Generalized weakness.    Data Reviewed: I have personally reviewed following labs and imaging studies  CBC: Recent Labs  Lab 04/02/20 1133 04/03/20 0253 04/04/20 0359  WBC 50.6* 36.8* 21.6*  HGB 14.9 13.3 13.1  HCT 46.1* 40.5 39.7  MCV 96.6 94.4 93.9  PLT 164 96* 91*   Basic Metabolic Panel: Recent Labs  Lab 04/02/20 1133 04/03/20 0253 04/04/20 0359  NA 136 139 141  K 3.7 3.3* 3.6  CL 99 104 108  CO2 23 25 25   GLUCOSE 134* 143* 120*  BUN 31* 23 20  CREATININE 1.45* 0.96 0.69  CALCIUM 8.9 8.5* 8.8*   GFR: Estimated Creatinine Clearance: 63.1 mL/min (by C-G formula based on SCr of 0.69 mg/dL). Liver Function Tests: Recent Labs  Lab 04/02/20 1133 04/04/20 0359  AST 35 18  ALT 39 25  ALKPHOS 105 87  BILITOT 1.4* 0.7  PROT 5.5* 4.8*  ALBUMIN 3.0* 2.3*   No results for input(s): LIPASE, AMYLASE in the last 168 hours. No results for input(s): AMMONIA in the last 168 hours. Coagulation Profile: No results for input(s): INR, PROTIME in the last 168 hours. Cardiac Enzymes: No results for input(s): CKTOTAL, CKMB, CKMBINDEX, TROPONINI in the last 168 hours. BNP (last 3 results) No results for input(s): PROBNP in the last 8760 hours. HbA1C: No results for input(s): HGBA1C in the last 72 hours. CBG: Recent Labs  Lab 04/02/20 1453  GLUCAP 122*   Lipid Profile: No results for input(s): CHOL, HDL, LDLCALC, TRIG, CHOLHDL, LDLDIRECT in the last 72 hours. Thyroid Function Tests: No results for input(s): TSH, T4TOTAL, FREET4, T3FREE, THYROIDAB in the last 72 hours. Anemia Panel: No results for input(s): VITAMINB12, FOLATE, FERRITIN, TIBC, IRON, RETICCTPCT in the last 72 hours. Sepsis Labs: Recent Labs  Lab 04/02/20 1409  LATICACIDVEN 2.9*    Recent Results (from the past 240 hour(s))  Blood culture (routine x 2)     Status: None  (Preliminary result)   Collection Time: 04/02/20 12:56 PM   Specimen: BLOOD  Result Value Ref Range Status   Specimen Description BLOOD LEFT ANTECUBITAL  Final   Special Requests   Final    BOTTLES DRAWN AEROBIC AND ANAEROBIC Blood Culture adequate volume   Culture  Setup Time   Final    IN BOTH AEROBIC AND ANAEROBIC BOTTLES GRAM NEGATIVE RODS CRITICAL VALUE NOTED.  VALUE IS CONSISTENT WITH PREVIOUSLY REPORTED AND CALLED VALUE. Performed at [facility], [place] [address], [place], [place] [address]    Culture GRAM NEGATIVE RODS  Final   Report Status PENDING  Incomplete  Blood culture (routine x 2)     Status: None (Preliminary result)  Collection Time: 04/02/20  1:01 PM   Specimen: BLOOD LEFT FOREARM  Result Value Ref Range Status   Specimen Description BLOOD LEFT FOREARM  Final   Special Requests   Final    BOTTLES DRAWN AEROBIC AND ANAEROBIC Blood Culture adequate volume   Culture  Setup Time   Final    IN BOTH AEROBIC AND ANAEROBIC BOTTLES GRAM NEGATIVE RODS CRITICAL RESULT CALLED TO, READ BACK BY AND VERIFIED WITH: T,JACKY PHARMD @0834  04/03/20 EB Performed at [facility], [place] [address], [place], [place] [address]    Culture GRAM NEGATIVE RODS  Final   Report Status PENDING  Incomplete  Blood Culture ID Panel (Reflexed)     Status: Abnormal   Collection Time: 04/02/20  1:01 PM  Result Value Ref Range Status   Enterococcus faecalis NOT DETECTED NOT DETECTED Final   Enterococcus Faecium NOT DETECTED NOT DETECTED Final   Listeria monocytogenes NOT DETECTED NOT DETECTED Final   Staphylococcus species NOT DETECTED NOT DETECTED Final   Staphylococcus aureus (BCID) NOT DETECTED NOT DETECTED Final   Staphylococcus epidermidis NOT DETECTED NOT DETECTED Final   Staphylococcus lugdunensis NOT DETECTED NOT DETECTED Final   Streptococcus species NOT DETECTED NOT DETECTED Final   Streptococcus agalactiae NOT DETECTED NOT DETECTED Final   Streptococcus pneumoniae NOT  DETECTED NOT DETECTED Final   Streptococcus pyogenes NOT DETECTED NOT DETECTED Final   A.calcoaceticus-baumannii NOT DETECTED NOT DETECTED Final   Bacteroides fragilis NOT DETECTED NOT DETECTED Final   Enterobacterales DETECTED (A) NOT DETECTED Final    Comment: Enterobacterales represent a large order of gram negative bacteria, not a single organism. CRITICAL RESULT CALLED TO, READ BACK BY AND VERIFIED WITH: T,JACKY PHARMD @0834  04/03/20 EB    Enterobacter cloacae complex NOT DETECTED NOT DETECTED Final   Escherichia coli DETECTED (A) NOT DETECTED Final    Comment: CRITICAL RESULT CALLED TO, READ BACK BY AND VERIFIED WITH: T,JACKY PHARMD @0834  04/03/20 EB    Klebsiella aerogenes NOT DETECTED NOT DETECTED Final   Klebsiella oxytoca NOT DETECTED NOT DETECTED Final   Klebsiella pneumoniae NOT DETECTED NOT DETECTED Final   Proteus species NOT DETECTED NOT DETECTED Final   Salmonella species NOT DETECTED NOT DETECTED Final   Serratia marcescens NOT DETECTED NOT DETECTED Final   Haemophilus influenzae NOT DETECTED NOT DETECTED Final   Neisseria meningitidis NOT DETECTED NOT DETECTED Final   Pseudomonas aeruginosa NOT DETECTED NOT DETECTED Final   Stenotrophomonas maltophilia NOT DETECTED NOT DETECTED Final   Candida albicans NOT DETECTED NOT DETECTED Final   Candida auris NOT DETECTED NOT DETECTED Final   Candida glabrata NOT DETECTED NOT DETECTED Final   Candida krusei NOT DETECTED NOT DETECTED Final   Candida parapsilosis NOT DETECTED NOT DETECTED Final   Candida tropicalis NOT DETECTED NOT DETECTED Final   Cryptococcus neoformans/gattii NOT DETECTED NOT DETECTED Final   CTX-M ESBL NOT DETECTED NOT DETECTED Final   Carbapenem resistance IMP NOT DETECTED NOT DETECTED Final   Carbapenem resistance KPC NOT DETECTED NOT DETECTED Final   Carbapenem resistance NDM NOT DETECTED NOT DETECTED Final   Carbapenem resist OXA 48 LIKE NOT DETECTED NOT DETECTED Final   Carbapenem resistance VIM NOT  DETECTED NOT DETECTED Final    Comment: Performed at [facility], [address] [place], [place] [address]  SARS CORONAVIRUS 2 (TAT 6-24 HRS) Nasopharyngeal Nasopharyngeal Swab     Status: Abnormal   Collection Time: 04/02/20 11:08 PM   Specimen: Nasopharyngeal Swab  Result Value Ref Range  Status   SARS Coronavirus 2 POSITIVE (A) NEGATIVE Final    Comment: (NOTE) SARS-CoV-2 target nucleic acids are DETECTED.  The SARS-CoV-2 RNA is generally detectable in upper and lower respiratory specimens during the acute phase of infection. Positive results are indicative of the presence of SARS-CoV-2 RNA. Clinical correlation with patient history and other diagnostic information is  necessary to determine patient infection status. Positive results do not rule out bacterial infection or co-infection with other viruses.  The expected result is Negative.  Fact Sheet for Patients: SugarRoll.be  Fact Sheet for Healthcare Providers: https://www.woods-mathews.com/  This test is not yet approved or cleared by the [place] FDA and  has been authorized for detection and/or diagnosis of SARS-CoV-2 by FDA under an Emergency Use Authorization (EUA). This EUA will remain  in effect (meaning this test can be used) for the duration of the COVID-19 declaration under Section 564(b)(1) of the Act, 21 U. S.C. section 360bbb-3(b)(1), unless the authorization is terminated or revoked sooner.   Performed at [facility], [place] [address] [place], [place] [address]          Radiology Studies: DG Chest 2 View  Result Date: 04/02/2020 CLINICAL DATA:  Hypotension EXAM: CHEST - 2 VIEW COMPARISON:  03/26/2020 FINDINGS: Stable cardiomegaly. Loop recorder device is in place. Chronically coarsened interstitial markings bilaterally. Aeration of the lung fields has slightly improved from prior. No new focal airspace consolidation. No pleural effusion or  pneumothorax. IMPRESSION: 1. Slightly improved aeration of the lung fields bilaterally. 2. Chronic interstitial lung changes. Electronically Signed   By: Davina Poke D.O.   On: 04/02/2020 13:43   CT Head Wo Contrast  Result Date: 04/02/2020 CLINICAL DATA:  Fall this morning.  Hypotensive. EXAM: CT HEAD WITHOUT CONTRAST TECHNIQUE: Contiguous axial images were obtained from the base of the skull through the vertex without intravenous contrast. COMPARISON:  05/30/2018 FINDINGS: Brain: No mass lesion, hemorrhage, hydrocephalus, acute infarct, intra-axial, or extra-axial fluid collection. Vascular: Intracranial atherosclerosis. Skull: No significant soft tissue swelling. Hyperostosis frontalis interna. No skull fracture. Sinuses/Orbits: Normal imaged portions of the orbits and globes. Fluid in both maxillary sinuses. Clear mastoid air cells. Other: None. IMPRESSION: 1.  No acute intracranial abnormality. 2. Sinus disease. Electronically Signed   By: Abigail Miyamoto M.D.   On: 04/02/2020 14:44   CT LUMBAR SPINE WO CONTRAST  Result Date: 04/02/2020 CLINICAL DATA:  Low back pain after a fall. Increased fracture risk. EXAM: CT LUMBAR SPINE WITHOUT CONTRAST TECHNIQUE: Multidetector CT imaging of the lumbar spine was performed without intravenous contrast administration. Multiplanar CT image reconstructions were also generated. COMPARISON:  11/18/2019 FINDINGS: Segmentation: 5 lumbar type vertebral bodies. Alignment: No traumatic malalignment. Chronic degenerative anterolisthesis at L5-S1 of 3 mm. Vertebrae: No regional fracture or primary bone lesion. Paraspinal and other soft tissues: Aortic atherosclerosis as seen previously. Disc levels: T12-L1: Solid bridging osteophytes.  No stenosis. L1-2: Chronic disc degeneration with vacuum phenomenon. Bulging of the disc with endplate osteophytes. Mild facet osteoarthritis. No compressive central canal stenosis. Mild lateral recess narrowing. L2-3: Chronic disc  degeneration with vacuum phenomenon. Endplate osteophytes and bulging of the disc. Bilateral facet osteoarthritis. Mild stenosis of the lateral recesses and foramina. L3-4: Chronic disc degeneration with vacuum phenomenon. Endplate osteophytes and bulging of the disc. Facet and ligamentous hypertrophy. Severe multifactorial stenosis that could cause neural compression on either or both sides. L4-5: Chronic disc degeneration with vacuum phenomenon. Endplate osteophytes and bulging of the disc. Facet arthropathy with facet and ligamentous  hypertrophy. Chronic stenosis of the canal, lateral recesses and neural foramina that could cause neural compression on either or both sides. L5-S1: Chronic facet arthropathy with 3 mm of anterolisthesis. Degeneration and bulging of the disc. Mild bilateral subarticular lateral recess stenosis. Bilateral foraminal encroachment by osteophyte and disc material could affect either L5 nerve. IMPRESSION: 1. Advanced chronic degenerative disc disease and degenerative facet disease throughout the lumbar region as outlined above. Findings are similar to the study of 11/18/2019. At L3-4 there is severe multifactorial spinal stenosis that could cause neural compression on either or both sides. At L4-5, there is chronic stenosis of the canal, lateral recesses and neural foramina that could cause neural compression on either or both sides. At L5-S1, there is bilateral foraminal encroachment by osteophyte and disc material that could affect either L5 nerve. 2. No acute fracture.  No traumatic malalignment. Aortic Atherosclerosis (ICD10-I70.0). Electronically Signed   By: Nelson Chimes M.D.   On: 04/02/2020 17:44        Scheduled Meds: . apixaban  5 mg Oral BID  . metoprolol tartrate  25 mg Oral TID  . montelukast  10 mg Oral QHS  . pantoprazole  40 mg Oral BID   Continuous Infusions: . cefTRIAXone (ROCEPHIN)  IV Stopped (04/04/20 0836)     LOS: 2 days    Time spent: 30  minutes    Barb Merino, MD [facility] Pager [phone]

## 2020-04-05 LAB — BASIC METABOLIC PANEL
Anion gap: 10 (ref 5–15)
BUN: 15 mg/dL (ref 8–23)
CO2: 24 mmol/L (ref 22–32)
Calcium: 8.7 mg/dL — ABNORMAL LOW (ref 8.9–10.3)
Chloride: 107 mmol/L (ref 98–111)
Creatinine, Ser: 0.66 mg/dL (ref 0.44–1.00)
GFR, Estimated: >60 mL/min (ref >60)
Glucose, Bld: 87 mg/dL (ref 70–99)
Potassium: 3.5 mmol/L (ref 3.5–5.1)
Sodium: 141 mmol/L (ref 135–145)

## 2020-04-05 LAB — CULTURE, BLOOD (ROUTINE X 2)
Special Requests: ADEQUATE
Special Requests: ADEQUATE

## 2020-04-05 LAB — CBC WITH DIFFERENTIAL/PLATELET
Abs Immature Granulocytes: 0.14 10*3/uL — ABNORMAL HIGH (ref 0.00–0.07)
Basophils Absolute: 0 10*3/uL (ref 0.0–0.1)
Basophils Relative: 0 %
Eosinophils Absolute: 0.4 10*3/uL (ref 0.0–0.5)
Eosinophils Relative: 3 %
HCT: 41.2 % (ref 36.0–46.0)
Hemoglobin: 14 g/dL (ref 12.0–15.0)
Immature Granulocytes: 1 %
Lymphocytes Relative: 14 %
Lymphs Abs: 2 10*3/uL (ref 0.7–4.0)
MCH: 31.5 pg (ref 26.0–34.0)
MCHC: 34 g/dL (ref 30.0–36.0)
MCV: 92.8 fL (ref 80.0–100.0)
Monocytes Absolute: 1.5 10*3/uL — ABNORMAL HIGH (ref 0.1–1.0)
Monocytes Relative: 11 %
Neutro Abs: 10.1 10*3/uL — ABNORMAL HIGH (ref 1.7–7.7)
Neutrophils Relative %: 71 %
Platelets: 99 10*3/uL — ABNORMAL LOW (ref 150–400)
RBC: 4.44 MIL/uL (ref 3.87–5.11)
RDW: 14.9 % (ref 11.5–15.5)
WBC: 14.2 10*3/uL — ABNORMAL HIGH (ref 4.0–10.5)
nRBC: 0 % (ref 0.0–0.2)

## 2020-04-05 LAB — URINE CULTURE: Culture: >=100000 — AB

## 2020-04-05 LAB — PHOSPHORUS: Phosphorus: 3.6 mg/dL (ref 2.5–4.6)

## 2020-04-05 LAB — MAGNESIUM: Magnesium: 2.2 mg/dL (ref 1.7–2.4)

## 2020-04-05 MED ORDER — TRAZODONE HCL 50 MG PO TABS: 50.0000 mg | ORAL_TABLET | Freq: Every evening | ORAL | 0 refills | Status: DC | PRN

## 2020-04-05 MED ORDER — HYDROCODONE-ACETAMINOPHEN 5-325 MG PO TABS: 1.0000 | ORAL_TABLET | Freq: Three times a day (TID) | ORAL | 0 refills | Status: AC | PRN

## 2020-04-05 MED ORDER — METOPROLOL TARTRATE 50 MG PO TABS
50.0000 mg | ORAL_TABLET | Freq: Three times a day (TID) | ORAL | Status: DC
  Administered 2020-04-05 – 2020-04-06 (×3): 50 mg via ORAL
  Filled 2020-04-05 (×3): qty 1

## 2020-04-05 MED ORDER — SODIUM CHLORIDE 0.9 % IV SOLN
2.0000 g | Freq: Four times a day (QID) | INTRAVENOUS | Status: DC
  Administered 2020-04-06 (×3): 2 g via INTRAVENOUS
  Filled 2020-04-05 (×2): qty 2000
  Filled 2020-04-05: qty 2
  Filled 2020-04-05 (×2): qty 2000

## 2020-04-05 NOTE — Progress Notes (Signed)
Pharmacy Antibiotic Note  Selena [name] is a 85 y.o. female admitted on 04/02/2020 with concern for sepsis of unknown source.   Assessment: Bacteremia of urinary source. Blood cultures growing GNRs in 4/4 bottles, E.coli sensitive to ampicillin. WBC improved, patient is afebrile. Pharmacy consulted for ampicillin dosing.  Plan: IV Ampicillin 2g q6. Monitor wbc, fever curve and clinical signs of improvement   When clinically appropriate for oral transition,  consider PO amoxicillin 500mg  q8  Temp (24hrs), Avg:97.7 F (36.5 C), Min:97.4 F (36.3 C), Max:98.2 F (36.8 C)  Recent Labs  Lab 04/02/20 1133 04/02/20 1409 04/03/20 0253 04/04/20 0359 04/05/20 0531  WBC 50.6*  --  36.8* 21.6* 14.2*  CREATININE 1.45*  --  0.96 0.69 0.66  LATICACIDVEN  --  2.9*  --   --   --     Estimated Creatinine Clearance: 62.7 mL/min (by C-G formula based on SCr of 0.66 mg/dL).    Allergies  Allergen Reactions  . Codeine Phosphate Other (See Comments)    Hallucinations  . Adhesive [Tape]     Electrodes for EKG- skin blistering, erythema  . Codeine Other (See Comments)    Hallucinations     Current antibiotics:  Vanc 3/10 x1 Metronidazole 3/10 x1 Ceftriaxone 3/10 >> 3/12 Ampicillin 3/13 >>   3/10 Bcx x2: E coli 4/4 sen ampicillin 3/10 Ucx: E.coli   Thank you for allowing pharmacy to be a part of this patient's care.  Norina Buzzard, PharmD PGY1 Pharmacy Resident 04/05/2020 10:37 AM

## 2020-04-05 NOTE — NC FL2 (Signed)
[place] LEVEL OF CARE SCREENING TOOL     IDENTIFICATION  Patient Name: Selena [name] Birthdate: [birth year] Sex: female Admission Date (Current Location): 04/02/2020  [facility] and [place] Number:  Herbalist and Address:  The [place], [place] [address], [place], [place] [address]      Provider Number: [id number]  Attending Physician Name and Address:  Barb Merino, MD  Relative Name and Phone Number:  Helene Kelp [phone]    Current Level of Care: SNF Recommended Level of Care: [place] Prior Approval Number:    Date Approved/Denied:   PASRR Number: [id number]  Discharge Plan: SNF    Current Diagnoses: Patient Active Problem List   Diagnosis Date Noted  . Fall at home, initial encounter 04/02/2020  . Sepsis ([place]) 04/02/2020  . AKI (acute kidney injury) ([place])   . Leukocytosis   . Chest pain 03/26/2020  . COVID-19 virus infection 03/08/2020  . Pneumonia due to COVID-19 virus 03/08/2020  . Cough 05/28/2019  . Shortness of breath 05/28/2019  . Acute gastric ulcer with hemorrhage   . Acute on chronic diastolic congestive heart failure ([place]) 10/16/2018  . History of atrioventricular nodal ablation 10/16/2018  . Thoracic aortic aneurysm ([place]) 10/16/2018  . Syncope 08/21/2018  . S/P ablation of atrial flutter 07/17/2018  . Upper airway cough syndrome 10/27/2017  . Essential hypertension 10/27/2017  . DOE (dyspnea on exertion) 10/26/2017  . Anxiety 09/10/2014  . Insomnia 09/10/2014  . Follicular 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 ([place]) 07/31/2014  . Paraspinal mass 07/16/2014  . Breast cancer of upper-outer quadrant of right female breast ([place]) 03/21/2011  . VERTIGO 09/07/2009  . Backache 07/09/2008  . FATIGUE 03/18/2008  . LFTs abnormal 12/26/2007  . MEMORY LOSS 05/16/2007  . ATRIAL FIBRILLATION 01/01/2007  . DIVERTICULOSIS, COLON 12/26/2006  . Osteoarthritis 12/26/2006     Orientation RESPIRATION BLADDER Height & Weight     Self,Time,Situation,Place  Normal External catheter Weight: 192 lb 1.6 oz (87.1 kg) (scale a) Height:  5\' 8"  (172.7 cm)  BEHAVIORAL SYMPTOMS/MOOD NEUROLOGICAL BOWEL NUTRITION STATUS      Continent Diet (see discharge summary)  AMBULATORY STATUS COMMUNICATION OF NEEDS Skin   Limited Assist Verbally Normal                       Personal Care Assistance Level of Assistance  Bathing,Dressing Bathing Assistance: Limited assistance   Dressing Assistance: Limited assistance     Functional Limitations Info  Hearing   Hearing Info: Impaired      SPECIAL CARE FACTORS FREQUENCY  PT (By licensed PT),OT (By licensed OT)     PT Frequency: 5x/wk OT Frequency: 5x/wk            Contractures Contractures Info: Not present    Additional Factors Info  Code Status,Allergies Code Status Info: FULL Allergies Info: Codeine Phosphate   Adhesive (Tape)   Codeine           Current Medications (04/05/2020):  This is the current hospital active medication list Current Facility-Administered Medications  Medication Dose Route Frequency Provider Last Rate Last Admin  . apixaban (ELIQUIS) tablet 5 mg  5 mg Oral BID Wynetta Fines T, MD   5 mg at 04/05/20 5456  . cefTRIAXone (ROCEPHIN) 2 g in sodium chloride 0.9 % 100 mL IVPB  2 g Intravenous Q24H Oswald Hillock, MD 200 mL/hr at 04/04/20 1940 2 g at 04/04/20 1940  . HYDROcodone-acetaminophen (  NORCO/VICODIN) 5-325 MG per tablet 1 tablet  1 tablet Oral Q6H PRN Lequita Halt, MD   1 tablet at 04/05/20 0914  . HYDROmorphone (DILAUDID) injection 0.5 mg  0.5 mg Intravenous Q4H PRN Wynetta Fines T, MD   0.5 mg at 04/04/20 1644  . metoprolol tartrate (LOPRESSOR) tablet 25 mg  25 mg Oral TID Lequita Halt, MD   25 mg at 04/05/20 0914  . montelukast (SINGULAIR) tablet 10 mg  10 mg Oral QHS Wynetta Fines T, MD   10 mg at 04/04/20 2137  . pantoprazole (PROTONIX) EC tablet 40 mg  40 mg Oral BID Wynetta Fines T, MD   40 mg at 04/05/20 0914  . traZODone (DESYREL) tablet 50 mg  50 mg Oral QHS PRN Lequita Halt, MD   50 mg at 04/04/20 2137     Discharge Medications: Please see discharge summary for a list of discharge medications.  Relevant Imaging Results:  Relevant Lab Results:   Additional Information SSN [id number] Pt is vaccinated and boostered  [place], LCSWA

## 2020-04-05 NOTE — TOC Progression Note (Addendum)
Transition of Care [facility] - Progression Note    Patient Details  Name: Jenasis Straley MRN: [id number] Date of Birth: May 22, 1935  Transition [facility]) CM/SW [place] Phone Number: 04/05/2020, 1:41 PM  Clinical Narrative:     CSW met with pt at bedside to discuss bed offers. Pt requested CSW  f/u with daughter and son for decision. CSW left list of facilities with current bed offers highlighted for pt/family to review.   CSW spoke with Caryl Pina [facility] [phone]) to start insurance auth. Ref #[id number] Fax: [phone]  Update 4:01pm CSW met with pt and daughter Helene Kelp at bedside to discuss bed offers. Pt/daughter still have not chosen, awaiting decision from pt's son [place]. Pt/daughter requested f/u in the morning.   TOC will continue to follow for d/c needs.   Expected Discharge Plan: [place] Barriers to Discharge: [place] Work up,SNF Pending bed offer  Expected Discharge Plan and Services Expected Discharge Plan: [place] arrangements for the past 2 months: Single Family Home                                       Social Determinants of Health (SDOH) Interventions    Readmission Risk Interventions No flowsheet data found.

## 2020-04-05 NOTE — Evaluation (Signed)
Occupational Therapy Evaluation Patient Details Name: Selena [name] MRN: [id number] DOB: [birth year] Today's Date: 04/05/2020    History of Present Illness 85 y.o. female was brought to ED for onset of hypotension at home that urgent care sent on.  Had recent stay in Feb 2022 for Covid PNA, has new sepsis UTI and e coli bacteremia.  New AKI with low K+, with back pain from chronic degenerative changes.  PMHx:  Covid PNA, CHF, a-fib, HTN, OA, memory loss, BPPV   Clinical Impression   Patient lives home alone in a single level house with 2 steps to enter. Children live next door and can provide assist as needed. At baseline patient is independent with self care, however since recent Covid diagnosis has had increased weakness. Patient needing min A for bed mobility with time to recover due to labored breathing, cued patient in pursed lip breathing technique. Patient needed mod A to power up to standing, then min A with rolling walker to safely transfer to recliner. Pt unable to perform LB Dressing due to R flank pain, decreased activity tolerance and strength. Recommend continued acute OT services to maximize patient safety and independence with self care in order to facilitate D/C to venue listed below.   Follow Up Recommendations  SNF;Other (comment) (vs HH with 24/7 if patient declines rehab)    Equipment Recommendations  3 in 1 bedside commode       Precautions / Restrictions Precautions Precautions: Fall Precaution Comments: monitor BP/HR Restrictions Weight Bearing Restrictions: No      Mobility Bed Mobility Overal bed mobility: Needs Assistance Bed Mobility: Supine to Sit     Supine to sit: Min assist;HOB elevated     General bed mobility comments: min A for trunk support to sit upright    Transfers Overall transfer level: Needs assistance Equipment used: Rolling walker (2 wheeled) Transfers: Sit to/from [facility] Sit to Stand: Mod assist Stand  pivot transfers: Min assist       General transfer comment: mod to power up to standing, min to take few steps to chair with walker    Balance Overall balance assessment: Needs assistance Sitting-balance support: Feet supported Sitting balance-Leahy Scale: Good     Standing balance support: Bilateral upper extremity supported Standing balance-Leahy Scale: Poor                             ADL either performed or assessed with clinical judgement   ADL Overall ADL's : Needs assistance/impaired     Grooming: Set up;Sitting   Upper Body Bathing: Minimal assistance;Sitting   Lower Body Bathing: Maximal assistance;Sitting/lateral leans   Upper Body Dressing : Minimal assistance;Sitting Upper Body Dressing Details (indicate cue type and reason): patient reports increased pain with R UE movement Lower Body Dressing: Sitting/lateral leans;Sit to/from stand;Total assistance Lower Body Dressing Details (indicate cue type and reason): seated in chair patient is unable to initiate bending forward to attempt doffing socks. cannot bring feet up to her due to bilateral knee replacement Toilet Transfer: Moderate assistance;Stand-pivot;Cueing for safety;Cueing for sequencing;BSC;RW Toilet Transfer Details (indicate cue type and reason): to recliner, mod A to power up to standing with cues to use momentum and for safe hand placement on walker. min A to complete pivot to recliner Toileting- Clothing Manipulation and Hygiene: Moderate assistance;Sitting/lateral lean;Sit to/from stand       Functional mobility during ADLs: Moderate assistance;Cueing for safety;Cueing for sequencing;Rolling walker  Pertinent Vitals/Pain Pain Assessment: Faces Faces Pain Scale: Hurts even more Pain Location: R flank Pain Descriptors / Indicators: Sore;Grimacing;Guarding Pain Intervention(s): RN gave pain meds during session     Hand Dominance Right   Extremity/Trunk  Assessment Upper Extremity Assessment Upper Extremity Assessment: Generalized weakness   Lower Extremity Assessment Lower Extremity Assessment: Defer to PT evaluation       Communication Communication Communication: HOH   Cognition Arousal/Alertness: Awake/alert Behavior During Therapy: WFL for tasks assessed/performed Overall Cognitive Status: Within Functional Limits for tasks assessed                                     General Comments  HR 83-86 in bed at seated EOB, 90 post transfer            Home Living Family/patient expects to be discharged to:: Private residence Living Arrangements: Alone Available Help at Discharge: Family;Available 24 hours/day Type of Home: House Home Access: Stairs to enter [employer] of Steps: 2 Entrance Stairs-Rails: None Home Layout: One level     Bathroom Shower/Tub: Occupational psychologist: Handicapped height     Home Equipment: Cane - single point;Walker - 2 wheels;Shower seat;Grab bars - tub/shower   Additional Comments: Son and daughter live on either side of pt and can assist if needed      Prior Functioning/Environment Level of Independence: Independent with assistive device(s)        Comments: Mod indep with SPC due to h/o vertigo. Drives. Mod indep with household tasks (reports some difficulty sweeping)        OT Problem List: Decreased strength;Decreased activity tolerance;Impaired balance (sitting and/or standing);Decreased safety awareness;Pain      OT Treatment/Interventions: Self-care/ADL training;Therapeutic exercise;Energy conservation;DME and/or AE instruction;Therapeutic activities;Patient/family education;Balance training    OT Goals(Current goals can be found in the care plan section) Acute Rehab OT Goals Patient Stated Goal: less pain OT Goal Formulation: With patient Time For Goal Achievement: 04/19/20 Potential to Achieve Goals: Good  OT Frequency: Min 2X/week     AM-PAC OT "6 Clicks" Daily Activity     Outcome Measure Help from another person eating meals?: None Help from another person taking care of personal grooming?: A Little Help from another person toileting, which includes using toliet, bedpan, or urinal?: A Lot Help from another person bathing (including washing, rinsing, drying)?: A Lot Help from another person to put on and taking off regular upper body clothing?: A Little Help from another person to put on and taking off regular lower body clothing?: Total 6 Click Score: 15   End of Session Equipment Utilized During Treatment: Rolling walker Nurse Communication: Mobility status  Activity Tolerance: Patient limited by pain;Patient limited by fatigue Patient left: in chair;with call bell/phone within reach;with chair alarm set;with family/visitor present  OT Visit Diagnosis: Other abnormalities of gait and mobility (R26.89);Muscle weakness (generalized) (M62.81);History of falling (Z91.81);Pain Pain - Right/Left: Right Pain - part of body:  (flank)                Time: 9518-8416 OT Time Calculation (min): 23 min Charges:  OT General Charges $OT Visit: 1 Visit OT Evaluation $OT Eval Low Complexity: 1 Low OT Treatments $Self Care/Home Management : 8-22 mins  Delbert Phenix OT OT pager: [place] 04/05/2020, 11:11 AM

## 2020-04-05 NOTE — TOC Initial Note (Signed)
Transition of Care [facility]) - Initial/Assessment Note    Patient Details  Name: Selena [name] MRN: [id number] Date of Birth: 04/24/1935  [facility] CM/SW Contact:    [place] Phone Number: [phone] 04/05/2020, 10:21 AM  Clinical Narrative:                  CSW spoke with patient's daughter, Helene Kelp via patients room phone. Patient present for conversation. CSW informed pt/daughter of PT recs for SNF. Pt/daughter agreeable to CSW sending out referrals. CSW provided patients daughter with Medicare.gov website to check ratings and preferred facility. Helene Kelp reports her sister in law is assisting with researching facilities for pt. Helene Kelp confirmed pt lives alone, no steps in home. Pt has a walker, cane, BSC, and lift chair at home. Pt is vaccinated against COVID with booster. TOC will continue to follow for discharge planning needs.    Expected Discharge Plan: Skilled Nursing Facility Barriers to Discharge: Insurance Authorization,Continued Medical Work up,SNF Pending bed offer   Patient Goals and CMS Choice Patient states their goals for this hospitalization and ongoing recovery are:: return home safely CMS Medicare.gov Compare Post Acute Care list provided to:: Patient Represenative (must comment) (provide daughter with Medicare.gov website) Choice offered to / list presented to : Adult Children (provided daughter with [email or web address].biz/)  Expected Discharge Plan and Services Expected Discharge Plan: [place]       Living arrangements for the past 2 months: Single Family Home  Prior Living Arrangements/Services Living arrangements for the past 2 months: Single Family Home Lives with:: Self Patient language and need for interpreter reviewed:: Yes Current home services: DME  Activities of Daily Living Home Assistive Devices/Equipment: Environmental consultant (specify type) ADL Screening (condition at time of admission) Patient's cognitive ability adequate  to safely complete daily activities?: Yes Is the patient deaf or have difficulty hearing?: Yes Does the patient have difficulty seeing, even when wearing glasses/contacts?: Yes Does the patient have difficulty concentrating, remembering, or making decisions?: No Patient able to express need for assistance with ADLs?: Yes Does the patient have difficulty dressing or bathing?: No Independently performs ADLs?: Yes (appropriate for developmental age) Does the patient have difficulty walking or climbing stairs?: Yes Weakness of Legs: Both Weakness of Arms/Hands: None  Permission Sought/Granted Permission sought to share information with : Facility Government social research officer granted to share information with : Yes, Verbal Permission Granted  Share Information with NAME: Helene Kelp  Permission granted to share info w AGENCY: SNF's  Permission granted to share info w Relationship: daughter  Permission granted to share info w Contact Information: [phone]  Emotional Assessment Appearance:: Other (Comment Required (spoke with pt/daughter via phone) Attitude/Demeanor/Rapport: Unable to Assess Affect (typically observed): Unable to Assess Orientation: : Oriented to Self,Oriented to Place,Oriented to  Time,Oriented to Situation      Admission diagnosis:  AKI (acute kidney injury) ([place]) [N17.9] Sepsis ([place]) [A41.9] Syncope, unspecified syncope type [R55] Leukocytosis, unspecified type [D72.829] Sepsis, due to unspecified organism, unspecified whether acute organ dysfunction present [facility]) [A41.9] Patient Active Problem List   Diagnosis Date Noted  . Fall at home, initial encounter 04/02/2020  . Sepsis ([place]) 04/02/2020  . AKI (acute kidney injury) ([place])   . Leukocytosis   . Chest pain 03/26/2020  . COVID-19 virus infection 03/08/2020  . Pneumonia due to COVID-19 virus 03/08/2020  . Cough 05/28/2019  . Shortness of breath 05/28/2019  . Acute gastric ulcer with hemorrhage    . Acute on chronic diastolic congestive  heart failure ([place]) 10/16/2018  . History of atrioventricular nodal ablation 10/16/2018  . Thoracic aortic aneurysm ([place]) 10/16/2018  . Syncope 08/21/2018  . S/P ablation of atrial flutter 07/17/2018  . Upper airway cough syndrome 10/27/2017  . Essential hypertension 10/27/2017  . DOE (dyspnea on exertion) 10/26/2017  . Anxiety 09/10/2014  . Insomnia 09/10/2014  . Follicular 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 ([place]) 07/31/2014  . Paraspinal mass 07/16/2014  . Breast cancer of upper-outer quadrant of right female breast ([place]) 03/21/2011  . VERTIGO 09/07/2009  . Backache 07/09/2008  . FATIGUE 03/18/2008  . LFTs abnormal 12/26/2007  . MEMORY LOSS 05/16/2007  . ATRIAL FIBRILLATION 01/01/2007  . DIVERTICULOSIS, COLON 12/26/2006  . Osteoarthritis 12/26/2006   PCP:  Caren Macadam, MD Pharmacy:   [facility] [place], [place] - [address] AT [place] [address] Phone: [phone] Fax: [phone]     Social Determinants of Health (SDOH) Interventions    Readmission Risk Interventions No flowsheet data found.

## 2020-04-05 NOTE — Progress Notes (Signed)
PROGRESS NOTE    Selena [name]  STM:[id number] DOB: 1936-01-22 DOA: 04/02/2020 PCP: Caren Macadam, MD    Brief Narrative:  85 year old female with history of recent Covid infection, chronic A. fib on Eliquis, chronic diastolic heart failure, hypertension, remote history of non-Hodgkin's lymphoma presented to the hospital from home with a fall.  She was sitting at the edge of the bed and slipped down to the floor.  She has been feeling weak for the last few days.  Recently admitted to hospital for 3 days for COVID-19 pneumonia and discharged on 2/15, gone to urgent care on 3/3 with new onset right-sided pleuritic chest pain and treated with 7 days of steroid therapy.  At the emergency room, head CT was normal.  Blood pressure 88/66.  WBC more than 50,000.  Complaining of severe back pain.   Assessment & Plan:   Active Problems:   Fall at home, initial encounter   Sepsis ([place])  Severe sepsis present on admission due to E. coli bacteremia/E. coli UTI: Treated with fluids and resuscitated.  WBC trending down.  Clinically improving. Initially on vancomycin and ceftriaxone.  Vancomycin discontinued. E. coli is pansensitive.  Changed to ampicillin today, will discharge on ampicillin for total 10 days of therapy. A CT scan of the spine was done that showed degenerative disease, negative for any inflammation. No evidence of urinary retention.  Chronic atrial fibrillation: Patient with intermittent palpitation and in and out of A. fib on monitor.  Due to symptomatic tachycardia, will increase metoprolol to 50 mg 3 times a day.  Therapeutic on Eliquis.   Acute kidney injury: Treated with IV fluids and improved.  GERD: On PPI continue.  Recent COVID-19 infection: Completely treated.  No indication to continue isolation.  Supportive therapy. Patient has significant pleuritic chest pain and cough covertly due to post Covid syndrome.  She was treated with 1 week of steroids.  Continue  bronchodilator therapy, incentive spirometry and deep breathing exercises.  Physical debility/acute illnesses: Work with PT OT.  Anticipate she will need skilled nursing level of care before going home independent.  Patient is clinically stabilizing.  She can be transferred to a skilled rehab whenever bed is available.   DVT prophylaxis:  apixaban (ELIQUIS) tablet 5 mg   Code Status: Full code Family Communication: Patient's daughter at bedside. Disposition Plan: Status is: Inpatient  Remains inpatient appropriate because:IV treatments appropriate due to intensity of illness or inability to take PO and Inpatient level of care appropriate due to severity of illness   Dispo: The patient is from: Home              Anticipated d/c is to: SNF              Patient currently is medically stable to transfer to skilled level of care.   Difficult to place patient No         Consultants:   None  Procedures:   None  Antimicrobials:  Antibiotics Given (last 72 hours)    Date/Time Action Medication Dose Rate   04/02/20 1432 New Bag/Given   ceFEPIme (MAXIPIME) 2 g in sodium chloride 0.9 % 100 mL IVPB 2 g 200 mL/hr   04/02/20 1924 New Bag/Given   cefTRIAXone (ROCEPHIN) 2 g in sodium chloride 0.9 % 100 mL IVPB 2 g 200 mL/hr   04/03/20 2206 New Bag/Given   cefTRIAXone (ROCEPHIN) 2 g in sodium chloride 0.9 % 100 mL IVPB 2 g 200 mL/hr   04/04/20 1940  New Bag/Given   cefTRIAXone (ROCEPHIN) 2 g in sodium chloride 0.9 % 100 mL IVPB 2 g 200 mL/hr         Subjective: Patient seen and examined.  Her daughter was at the bedside.  She did feel occasional palpitation especially with exertion and when eating breakfast today morning.  Telemetry shows sinus rhythm today.  Earlier she had episode of A. fib with heart rate less than 90. Her main complaint is right anterior chest pain and dry cough.  Remains afebrile.  Objective: Vitals:   04/05/20 0300 04/05/20 0456 04/05/20 0847 04/05/20  1305  BP:  (!) 152/90 134/80 134/73  Pulse:  83 83 84  Resp:  20    Temp:  (!) 97.5 F (36.4 C) (!) 97.4 F (36.3 C) 97.8 F (36.6 C)  TempSrc:  Oral Oral Oral  SpO2:  95% 96% 97%  Weight: 87.1 kg     Height:        Intake/Output Summary (Last 24 hours) at 04/05/2020 1341 Last data filed at 04/05/2020 0825 Gross per 24 hour  Intake 485 ml  Output 2050 ml  Net -1565 ml   Filed Weights   04/03/20 1455 04/04/20 0500 04/05/20 0300  Weight: 88 kg 88.5 kg 87.1 kg    Examination:  General exam: Appears calm and comfortable  Chronically sick looking, frail and debilitated.  Not in any distress. Today, she is sitting in chair.  Slightly anxious but looks comfortable. Respiratory system: Clear to auscultation. Respiratory effort normal.  No added sounds. Cardiovascular system: S1 & S2 heard, irregularly irregular.  Gastrointestinal system: Soft.  Nontender. Central nervous system: Alert and oriented. No focal neurological deficits. Extremities: Symmetric 5 x 5 power.  Generalized weakness.    Data Reviewed: I have personally reviewed following labs and imaging studies  CBC: Recent Labs  Lab 04/02/20 1133 04/03/20 0253 04/04/20 0359 04/05/20 0531  WBC 50.6* 36.8* 21.6* 14.2*  NEUTROABS  --   --   --  10.1*  HGB 14.9 13.3 13.1 14.0  HCT 46.1* 40.5 39.7 41.2  MCV 96.6 94.4 93.9 92.8  PLT 164 96* 91* 99*   Basic Metabolic Panel: Recent Labs  Lab 04/02/20 1133 04/03/20 0253 04/04/20 0359 04/05/20 0531  NA 136 139 141 141  K 3.7 3.3* 3.6 3.5  CL 99 104 108 107  CO2 23 25 25 24   GLUCOSE 134* 143* 120* 87  BUN 31* 23 20 15   CREATININE 1.45* 0.96 0.69 0.66  CALCIUM 8.9 8.5* 8.8* 8.7*  MG  --   --   --  2.2  PHOS  --   --   --  3.6   GFR: Estimated Creatinine Clearance: 62.7 mL/min (by C-G formula based on SCr of 0.66 mg/dL). Liver Function Tests: Recent Labs  Lab 04/02/20 1133 04/04/20 0359  AST 35 18  ALT 39 25  ALKPHOS 105 87  BILITOT 1.4* 0.7  PROT  5.5* 4.8*  ALBUMIN 3.0* 2.3*   No results for input(s): LIPASE, AMYLASE in the last 168 hours. No results for input(s): AMMONIA in the last 168 hours. Coagulation Profile: No results for input(s): INR, PROTIME in the last 168 hours. Cardiac Enzymes: No results for input(s): CKTOTAL, CKMB, CKMBINDEX, TROPONINI in the last 168 hours. BNP (last 3 results) No results for input(s): PROBNP in the last 8760 hours. HbA1C: No results for input(s): HGBA1C in the last 72 hours. CBG: Recent Labs  Lab 04/02/20 1453  GLUCAP 122*   Lipid Profile: No  results for input(s): CHOL, HDL, LDLCALC, TRIG, CHOLHDL, LDLDIRECT in the last 72 hours. Thyroid Function Tests: No results for input(s): TSH, T4TOTAL, FREET4, T3FREE, THYROIDAB in the last 72 hours. Anemia Panel: No results for input(s): VITAMINB12, FOLATE, FERRITIN, TIBC, IRON, RETICCTPCT in the last 72 hours. Sepsis Labs: Recent Labs  Lab 04/02/20 1409  LATICACIDVEN 2.9*    Recent Results (from the past 240 hour(s))  Blood culture (routine x 2)     Status: Abnormal   Collection Time: 04/02/20 12:56 PM   Specimen: BLOOD  Result Value Ref Range Status   Specimen Description BLOOD LEFT ANTECUBITAL  Final   Special Requests   Final    BOTTLES DRAWN AEROBIC AND ANAEROBIC Blood Culture adequate volume   Culture  Setup Time   Final    IN BOTH AEROBIC AND ANAEROBIC BOTTLES GRAM NEGATIVE RODS CRITICAL VALUE NOTED.  VALUE IS CONSISTENT WITH PREVIOUSLY REPORTED AND CALLED VALUE.    Culture (A)  Final    ESCHERICHIA COLI SUSCEPTIBILITIES PERFORMED ON PREVIOUS CULTURE WITHIN THE LAST 5 DAYS. Performed at [facility], [place] [address] [place], [place] [address]    Report Status 04/05/2020 FINAL  Final  Blood culture (routine x 2)     Status: Abnormal   Collection Time: 04/02/20  1:01 PM   Specimen: BLOOD LEFT FOREARM  Result Value Ref Range Status   Specimen Description BLOOD LEFT FOREARM  Final   Special Requests   Final    BOTTLES  DRAWN AEROBIC AND ANAEROBIC Blood Culture adequate volume   Culture  Setup Time   Final    IN BOTH AEROBIC AND ANAEROBIC BOTTLES GRAM NEGATIVE RODS CRITICAL RESULT CALLED TO, READ BACK BY AND VERIFIED WITH: T,JACKY PHARMD @0834  04/03/20 EB Performed at [facility], [place] [address], [place], [place] [address]    Culture ESCHERICHIA COLI (A)  Final   Report Status 04/05/2020 FINAL  Final   Organism ID, Bacteria ESCHERICHIA COLI  Final      Susceptibility   Escherichia coli - MIC*    AMPICILLIN <=2 SENSITIVE Sensitive     CEFAZOLIN <=4 SENSITIVE Sensitive     CEFEPIME <=0.12 SENSITIVE Sensitive     CEFTAZIDIME <=1 SENSITIVE Sensitive     CEFTRIAXONE <=0.25 SENSITIVE Sensitive     CIPROFLOXACIN <=0.25 SENSITIVE Sensitive     GENTAMICIN <=1 SENSITIVE Sensitive     IMIPENEM <=0.25 SENSITIVE Sensitive     TRIMETH/SULFA <=20 SENSITIVE Sensitive     AMPICILLIN/SULBACTAM <=2 SENSITIVE Sensitive     PIP/TAZO <=4 SENSITIVE Sensitive     * ESCHERICHIA COLI  Blood Culture ID Panel (Reflexed)     Status: Abnormal   Collection Time: 04/02/20  1:01 PM  Result Value Ref Range Status   Enterococcus faecalis NOT DETECTED NOT DETECTED Final   Enterococcus Faecium NOT DETECTED NOT DETECTED Final   Listeria monocytogenes NOT DETECTED NOT DETECTED Final   Staphylococcus species NOT DETECTED NOT DETECTED Final   Staphylococcus aureus (BCID) NOT DETECTED NOT DETECTED Final   Staphylococcus epidermidis NOT DETECTED NOT DETECTED Final   Staphylococcus lugdunensis NOT DETECTED NOT DETECTED Final   Streptococcus species NOT DETECTED NOT DETECTED Final   Streptococcus agalactiae NOT DETECTED NOT DETECTED Final   Streptococcus pneumoniae NOT DETECTED NOT DETECTED Final   Streptococcus pyogenes NOT DETECTED NOT DETECTED Final   A.calcoaceticus-baumannii NOT DETECTED NOT DETECTED Final   Bacteroides fragilis NOT DETECTED NOT DETECTED Final   Enterobacterales DETECTED (A) NOT DETECTED Final  Comment:  Enterobacterales represent a large order of gram negative bacteria, not a single organism. CRITICAL RESULT CALLED TO, READ BACK BY AND VERIFIED WITH: T,JACKY PHARMD @0834  04/03/20 EB    Enterobacter cloacae complex NOT DETECTED NOT DETECTED Final   Escherichia coli DETECTED (A) NOT DETECTED Final    Comment: CRITICAL RESULT CALLED TO, READ BACK BY AND VERIFIED WITH: T,JACKY PHARMD @0834  04/03/20 EB    Klebsiella aerogenes NOT DETECTED NOT DETECTED Final   Klebsiella oxytoca NOT DETECTED NOT DETECTED Final   Klebsiella pneumoniae NOT DETECTED NOT DETECTED Final   Proteus species NOT DETECTED NOT DETECTED Final   Salmonella species NOT DETECTED NOT DETECTED Final   Serratia marcescens NOT DETECTED NOT DETECTED Final   Haemophilus influenzae NOT DETECTED NOT DETECTED Final   Neisseria meningitidis NOT DETECTED NOT DETECTED Final   Pseudomonas aeruginosa NOT DETECTED NOT DETECTED Final   Stenotrophomonas maltophilia NOT DETECTED NOT DETECTED Final   Candida albicans NOT DETECTED NOT DETECTED Final   Candida auris NOT DETECTED NOT DETECTED Final   Candida glabrata NOT DETECTED NOT DETECTED Final   Candida krusei NOT DETECTED NOT DETECTED Final   Candida parapsilosis NOT DETECTED NOT DETECTED Final   Candida tropicalis NOT DETECTED NOT DETECTED Final   Cryptococcus neoformans/gattii NOT DETECTED NOT DETECTED Final   CTX-M ESBL NOT DETECTED NOT DETECTED Final   Carbapenem resistance IMP NOT DETECTED NOT DETECTED Final   Carbapenem resistance KPC NOT DETECTED NOT DETECTED Final   Carbapenem resistance NDM NOT DETECTED NOT DETECTED Final   Carbapenem resist OXA 48 LIKE NOT DETECTED NOT DETECTED Final   Carbapenem resistance VIM NOT DETECTED NOT DETECTED Final    Comment: Performed at [facility], [address], [place], [place] [address]  Urine culture     Status: Abnormal   Collection Time: 04/02/20  3:02 PM   Specimen: Urine, Random  Result Value Ref Range Status   Specimen  Description URINE, RANDOM  Final   Special Requests   Final    NONE Performed at [facility], [place] [address], [place], [place] [address]    Culture >=100,000 COLONIES/mL ESCHERICHIA COLI (A)  Final   Report Status 04/05/2020 FINAL  Final   Organism ID, Bacteria ESCHERICHIA COLI (A)  Final      Susceptibility   Escherichia coli - MIC*    AMPICILLIN <=2 SENSITIVE Sensitive     CEFAZOLIN <=4 SENSITIVE Sensitive     CEFEPIME <=0.12 SENSITIVE Sensitive     CEFTRIAXONE <=0.25 SENSITIVE Sensitive     CIPROFLOXACIN <=0.25 SENSITIVE Sensitive     GENTAMICIN <=1 SENSITIVE Sensitive     IMIPENEM <=0.25 SENSITIVE Sensitive     NITROFURANTOIN <=16 SENSITIVE Sensitive     TRIMETH/SULFA <=20 SENSITIVE Sensitive     AMPICILLIN/SULBACTAM <=2 SENSITIVE Sensitive     PIP/TAZO <=4 SENSITIVE Sensitive     * >=100,000 COLONIES/mL ESCHERICHIA COLI  SARS CORONAVIRUS 2 (TAT 6-24 HRS) Nasopharyngeal Nasopharyngeal Swab     Status: Abnormal   Collection Time: 04/02/20 11:08 PM   Specimen: Nasopharyngeal Swab  Result Value Ref Range Status   SARS Coronavirus 2 POSITIVE (A) NEGATIVE Final    Comment: (NOTE) SARS-CoV-2 target nucleic acids are DETECTED.  The SARS-CoV-2 RNA is generally detectable in upper and lower respiratory specimens during the acute phase of infection. Positive results are indicative of the presence of SARS-CoV-2 RNA. Clinical correlation with patient history and other diagnostic information is  necessary to determine patient infection status. Positive  results do not rule out bacterial infection or co-infection with other viruses.  The expected result is Negative.  Fact Sheet for Patients: SugarRoll.be  Fact Sheet for Healthcare Providers: https://www.woods-mathews.com/  This test is not yet approved or cleared by the [place] FDA and  has been authorized for detection and/or diagnosis of SARS-CoV-2 by FDA under an Emergency Use  Authorization (EUA). This EUA will remain  in effect (meaning this test can be used) for the duration of the COVID-19 declaration under Section 564(b)(1) of the Act, 21 U. S.C. section 360bbb-3(b)(1), unless the authorization is terminated or revoked sooner.   Performed at [facility], [place] [address] [place], [place] [address]          Radiology Studies: No results found.      Scheduled Meds: . apixaban  5 mg Oral BID  . metoprolol tartrate  50 mg Oral TID  . montelukast  10 mg Oral QHS  . pantoprazole  40 mg Oral BID   Continuous Infusions: . ampicillin (OMNIPEN) IV       LOS: 3 days    Time spent: 30 minutes    Barb Merino, MD [facility] Pager [phone]

## 2020-04-06 LAB — CBC WITH DIFFERENTIAL/PLATELET
Abs Immature Granulocytes: 0.3 10*3/uL — ABNORMAL HIGH (ref 0.00–0.07)
Basophils Absolute: 0.1 10*3/uL (ref 0.0–0.1)
Basophils Relative: 0 %
Eosinophils Absolute: 0.6 10*3/uL — ABNORMAL HIGH (ref 0.0–0.5)
Eosinophils Relative: 5 %
HCT: 39.4 % (ref 36.0–46.0)
Hemoglobin: 13.2 g/dL (ref 12.0–15.0)
Immature Granulocytes: 3 %
Lymphocytes Relative: 15 %
Lymphs Abs: 1.7 10*3/uL (ref 0.7–4.0)
MCH: 31 pg (ref 26.0–34.0)
MCHC: 33.5 g/dL (ref 30.0–36.0)
MCV: 92.5 fL (ref 80.0–100.0)
Monocytes Absolute: 1.4 10*3/uL — ABNORMAL HIGH (ref 0.1–1.0)
Monocytes Relative: 12 %
Neutro Abs: 7.8 10*3/uL — ABNORMAL HIGH (ref 1.7–7.7)
Neutrophils Relative %: 65 %
Platelets: 115 10*3/uL — ABNORMAL LOW (ref 150–400)
RBC: 4.26 MIL/uL (ref 3.87–5.11)
RDW: 14.6 % (ref 11.5–15.5)
WBC: 11.9 10*3/uL — ABNORMAL HIGH (ref 4.0–10.5)
nRBC: 0 % (ref 0.0–0.2)

## 2020-04-06 MED ORDER — METOPROLOL TARTRATE 50 MG PO TABS: 50.0000 mg | ORAL_TABLET | Freq: Three times a day (TID) | ORAL | 0 refills | Status: DC

## 2020-04-06 MED ORDER — AMPICILLIN 500 MG PO CAPS: 500.0000 mg | ORAL_CAPSULE | Freq: Three times a day (TID) | ORAL | 0 refills | Status: AC

## 2020-04-06 NOTE — Progress Notes (Signed)
D/C instructions given and reviewed. Questions asked and answered. Awaiting lunch dose of IV antibiotics.

## 2020-04-06 NOTE — TOC Transition Note (Signed)
Transition of Care [facility]) - CM/SW Discharge Note   Patient Details  Name: Selena [name] MRN: [id number] Date of Birth: [birth year]  Transition of [facility]) CM/SW Contact:  Zenon Mayo, RN Phone Number: 04/06/2020, 11:38 AM   Clinical Narrative:    Patient is  For dc today, she is set up with [facility] for [place] and [place].  Soc will begin 24 to 48 hrs post dc.    Final next level of care: [place] Barriers to Discharge: No Barriers Identified   Patient Goals and CMS Choice Patient states their goals for this hospitalization and ongoing recovery are:: home with [facility] CMS Medicare.gov Compare Post Acute Care list provided to:: Patient Represenative (must comment) Choice offered to / list presented to : Adult Children  Discharge Placement                       Discharge Plan and Services                  DME Agency: NA                  Social Determinants of Health (SDOH) Interventions     Readmission Risk Interventions No flowsheet data found.

## 2020-04-06 NOTE — Plan of Care (Signed)

## 2020-04-06 NOTE — Discharge Instructions (Signed)

## 2020-04-06 NOTE — TOC Progression Note (Signed)
Transition of Care [facility]) - Progression Note    Patient Details  Name: Selena [name] MRN: [id number] Date of Birth: 01-31-1935  [facility]) CM/SW Contact  Zenon Mayo, RN Phone Number: 04/06/2020, 11:24 AM  Clinical Narrative:    NCM spoke with son , offered choice for [facility] services , he does not have a preference, NCM made referral to [facility] for [place], [place].  Tommi Rumps states he can take this referral.  Soc will begin 24 to 48 hrs post dc.     Expected Discharge Plan: Skilled Nursing Facility Barriers to Discharge: [place] Work up,SNF Pending bed offer  Expected Discharge Plan and Services Expected Discharge Plan: [place] arrangements for the past 2 months: Single Family Home Expected Discharge Date: 04/06/20                                     Social Determinants of Health (SDOH) Interventions    Readmission Risk Interventions No flowsheet data found.

## 2020-04-06 NOTE — Progress Notes (Signed)
Physical Therapy Treatment Patient Details Name: Selena [name] MRN: [id number] DOB: Jun 13, 1935 Today's Date: 04/06/2020    History of Present Illness 85 y.o. female was brought to ED for onset of hypotension at home that urgent care sent on.  Had recent stay in Feb 2022 for Covid PNA, has new sepsis UTI and e coli bacteremia.  New AKI with low K+, with back pain from chronic degenerative changes.  PMHx:  Covid PNA, CHF, a-fib, HTN, OA, memory loss, BPPV    PT Comments    Patient progressing well towards PT goals. Improved ambulation distance today with Min guard assist and use of RW for support. Pt limited by dyspnea and decreased endurance/fatigue requiring seated rest break half way through ambulation. VSS on RA with activity. Pt requires assist for standing from low surfaces, anywhere from Mod-min A with cues for momentum and anterior weight shift. Pt reports using lift chair at home. Discharge recommendation updated to home with [facility] services as pt declines SNF and has support from children at home. Will follow.    Follow Up Recommendations  Home health PT;Supervision - Intermittent     Equipment Recommendations  None recommended by PT    Recommendations for Other Services       Precautions / Restrictions Precautions Precautions: Fall;Other (comment) Precaution Comments: monitor BP/HR Restrictions Weight Bearing Restrictions: No    Mobility  Bed Mobility Overal bed mobility: Needs Assistance Bed Mobility: Supine to Sit     Supine to sit: Min guard;HOB elevated     General bed mobility comments: Increased time and use of rail but no assist needed. No dizziness reported.    Transfers Overall transfer level: Needs assistance Equipment used: Rolling walker (2 wheeled) Transfers: Sit to/from Stand Sit to Stand: Mod assist;Min assist         General transfer comment: Initially Mod A to power to standing with use of momentum and a few failed attempts  prior to  assist from PT. Stood from [employer], from rollator seat x1 progressing to min A and cues for anterior weight shift. (Uses lift chair at home). Transferred to chair post ambulation.  Ambulation/Gait Ambulation/Gait assistance: Min guard Gait Distance (Feet): 40 Feet (2 bouts) Assistive device: Rolling walker (2 wheeled) Gait Pattern/deviations: Step-through pattern;Decreased stride length;Trunk flexed Gait velocity: reduced   General Gait Details: Slow, mostly steady gait with 2-3/4 DOE. 1 seated rest break. VSS on RA.   Stairs             Wheelchair Mobility    Modified Rankin (Stroke Patients Only)       Balance Overall balance assessment: Needs assistance Sitting-balance support: Feet supported;No upper extremity supported Sitting balance-Leahy Scale: Good     Standing balance support: During functional activity Standing balance-Leahy Scale: Poor Standing balance comment: Requires UE support in standing.                            Cognition Arousal/Alertness: Awake/alert Behavior During Therapy: WFL for tasks assessed/performed Overall Cognitive Status: Within Functional Limits for tasks assessed                                 General Comments: HOH so needs repetition at times.      Exercises      General Comments General comments (skin integrity, edema, etc.): Daughter present during session. VSS on RA during session.  Pertinent Vitals/Pain Pain Assessment: No/denies pain    Home Living                      Prior Function            PT Goals (current goals can now be found in the care plan section) Progress towards PT goals: Progressing toward goals    Frequency    Min 3X/week      PT Plan Discharge plan needs to be updated    Co-evaluation              AM-PAC PT "6 Clicks" Mobility   Outcome Measure  Help needed turning from your back to your side while in a flat bed without using  bedrails?: A Little Help needed moving from lying on your back to sitting on the side of a flat bed without using bedrails?: A Little Help needed moving to and from a bed to a chair (including a wheelchair)?: A Lot Help needed standing up from a chair using your arms (e.g., wheelchair or bedside chair)?: A Lot Help needed to walk in hospital room?: A Little Help needed climbing 3-5 steps with a railing? : A Lot 6 Click Score: 15    End of Session Equipment Utilized During Treatment: Gait belt Activity Tolerance: Patient tolerated treatment well;Patient limited by fatigue Patient left: in chair;with call bell/phone within reach;with family/visitor present Nurse Communication: Mobility status PT Visit Diagnosis: Unsteadiness on feet (R26.81);Muscle weakness (generalized) (M62.81)     Time: 4562-5638 PT Time Calculation (min) (ACUTE ONLY): 22 min  Charges:  $Gait Training: 8-22 mins                     Marisa Severin, PT, DPT Acute Rehabilitation Services Pager [phone] Office [phone]       [place] 04/06/2020, 11:32 AM

## 2020-04-06 NOTE — Discharge Summary (Signed)
Physician Discharge Summary  Selena [name] KVQ:[id number] DOB: 12/04/1935 DOA: 04/02/2020  PCP: Caren Macadam, MD  Admit date: 04/02/2020 Discharge date: 04/06/2020  Admitted From: Home Disposition: Home with home health  Recommendations for Outpatient Follow-up:  1. Follow up with PCP in 1-2 weeks, schedule follow-up 2. Please obtain BMP/CBC/magnesium/phosphorus in one week   Home Health: PT/OT/RN Equipment/Devices: Not applicable  Discharge Condition: Stable CODE STATUS: Full code Diet recommendation: Low-salt diet  Discharge summary: 85 year old female with history of recent Covid infection, chronic A. fib on Eliquis, chronic diastolic heart failure, hypertension, remote history of non-Hodgkin's lymphoma presented to the hospital from home with a fall.  She was sitting at the edge of the bed and slipped down to the floor.  She has been feeling weak for the last few days.  Recently admitted to hospital for 3 days for COVID-19 pneumonia and discharged on 2/15, gone to urgent care on 3/3 with new onset right-sided pleuritic chest pain and treated with 7 days of steroid therapy.  At the emergency room, head CT was normal.  Blood pressure 88/66.  WBC more than 50,000.  Complaining of severe back pain.  Patient was admitted to hospital with sepsis and found to have E. coli bacteremia and E. coli UTI.   Assessment & plan of care:   #1 . severe sepsis present on admission due to E. coli bacteremia/E. coli UTI: Patient was treated with IV fluids, never needed any vasopressors.  She presented with WBC count of 50,000 and was also using steroids.  Did very good clinical improvement.   Blood cultures with pansensitive E. coli.  Urine culture with pansensitive E. coli.  No evidence of urinary obstruction on CT scans.  Urinating well.   Patient received different antibiotics and currently on ampicillin.  Finished 5 days of therapy.  Will continue 5 more days of ampicillin by mouth to  complete total 10 days of therapy.   #2. paroxysmal atrial fibrillation: Patient with intermittent palpitation and in and out of A. fib on monitor.  Due to symptomatic tachycardia, will increase metoprolol to 50 mg 3 times a day.  Therapeutic on Eliquis.   #3 .acute kidney injury: Treated with IV fluids and improved.  Normalized.  #4 .GERD: On PPI continue.  #5 .recent COVID-19 infection: Completely treated.  No indication to continue isolation.  Supportive therapy. Patient has significant pleuritic chest pain and cough mostly due to post Covid syndrome.  She was treated with 1 week of steroids.  Continue bronchodilator therapy, incentive spirometry and deep breathing exercises.  Patient with physical debility due to multiple acute problems.  Seen by PT OT.  Has good support system at home.  Recommended SNF, however patient and family desire to go home with home health and they have support at home.  Discharging home with home health PT/OT/RN.    Discharge Diagnoses:  Active Problems:   Fall at home, initial encounter   Sepsis [facility])    Discharge Instructions  Discharge Instructions    Call MD for:  difficulty breathing, headache or visual disturbances   Complete by: As directed    Call MD for:  persistant nausea and vomiting   Complete by: As directed    Call MD for:  temperature >100.4   Complete by: As directed    Diet - low sodium heart healthy   Complete by: As directed    Discharge instructions   Complete by: As directed    Continue doing breathing exercises at home.  You can use over the counter cough medicine line mucinex   Increase activity slowly   Complete by: As directed      Allergies as of 04/06/2020      Reactions   Codeine Phosphate Other (See Comments)   Hallucinations   Adhesive [tape]    Electrodes for EKG- skin blistering, erythema   Codeine Other (See Comments)   Hallucinations       Medication List    TAKE these medications   amLODipine 5  MG tablet Commonly known as: NORVASC TAKE 1 TABLET BY MOUTH DAILY   ampicillin 500 MG capsule Commonly known as: PRINCIPEN Take 1 capsule (500 mg total) by mouth 3 (three) times daily for 5 days.   apixaban 5 MG Tabs tablet Commonly known as: Eliquis Take 1 tablet (5 mg total) by mouth 2 (two) times daily.   furosemide 20 MG tablet Commonly known as: LASIX Take 1 tablet (20 mg total) by mouth daily.   HYDROcodone-acetaminophen 5-325 MG tablet Commonly known as: Norco Take 1 tablet by mouth every 8 (eight) hours as needed for up to 5 days. What changed: when to take this   metoprolol tartrate 50 MG tablet Commonly known as: LOPRESSOR Take 1 tablet (50 mg total) by mouth 3 (three) times daily. MAY TAKE ONE EXTRA TABLET FOR HEART RATE OVER 100. What changed:   medication strength  how much to take   montelukast 10 MG tablet Commonly known as: SINGULAIR TAKE 1 TABLET(10 MG) BY MOUTH AT BEDTIME What changed: See the new instructions.   pantoprazole 40 MG tablet Commonly known as: PROTONIX TAKE 1 TABLET(40 MG) BY MOUTH TWICE DAILY What changed: See the new instructions.   PRESERVISION AREDS 2 PO Take 2 capsules by mouth daily.   traZODone 50 MG tablet Commonly known as: DESYREL Take 1 tablet (50 mg total) by mouth at bedtime as needed for up to 5 days for sleep.   ZINC PO Take 1 tablet by mouth every evening.       Follow-up Information    Kary Kos, MD. Schedule an appointment as soon as possible for a visit in 3 week(s).   Specialty: Neurosurgery Contact information: [address] [place] [address] [phone]              Allergies  Allergen Reactions  . Codeine Phosphate Other (See Comments)    Hallucinations  . Adhesive [Tape]     Electrodes for EKG- skin blistering, erythema  . Codeine Other (See Comments)    Hallucinations     Consultations:  None   Procedures/Studies: DG Chest 2 View  Result Date:  04/02/2020 CLINICAL DATA:  Hypotension EXAM: CHEST - 2 VIEW COMPARISON:  03/26/2020 FINDINGS: Stable cardiomegaly. Loop recorder device is in place. Chronically coarsened interstitial markings bilaterally. Aeration of the lung fields has slightly improved from prior. No new focal airspace consolidation. No pleural effusion or pneumothorax. IMPRESSION: 1. Slightly improved aeration of the lung fields bilaterally. 2. Chronic interstitial lung changes. Electronically Signed   By: Davina Poke D.O.   On: 04/02/2020 13:43   DG Chest 2 View  Result Date: 03/26/2020 CLINICAL DATA:  Shortness of breath. Recent COVID-19 viral pneumonia. EXAM: CHEST - 2 VIEW COMPARISON:  03/09/2020 and 05/28/2019 FINDINGS: Stable mild cardiomegaly. Aortic atherosclerotic calcification noted. Cardiac loop recorder is again seen. Coarsening of interstitial lung markings is seen in the peripheral in basilar lung zones, increased since previous study, and consistent with progressive fibrosis. No evidence  of pulmonary consolidation or edema. No evidence of pleural effusion. IMPRESSION: Interval progression of pulmonary interstitial fibrosis. No acute findings. Stable mild cardiomegaly. Electronically Signed   By: Marlaine Hind M.D.   On: 03/26/2020 12:45   CT Head Wo Contrast  Result Date: 04/02/2020 CLINICAL DATA:  Fall this morning.  Hypotensive. EXAM: CT HEAD WITHOUT CONTRAST TECHNIQUE: Contiguous axial images were obtained from the base of the skull through the vertex without intravenous contrast. COMPARISON:  05/30/2018 FINDINGS: Brain: No mass lesion, hemorrhage, hydrocephalus, acute infarct, intra-axial, or extra-axial fluid collection. Vascular: Intracranial atherosclerosis. Skull: No significant soft tissue swelling. Hyperostosis frontalis interna. No skull fracture. Sinuses/Orbits: Normal imaged portions of the orbits and globes. Fluid in both maxillary sinuses. Clear mastoid air cells. Other: None. IMPRESSION: 1.  No acute  intracranial abnormality. 2. Sinus disease. Electronically Signed   By: Abigail Miyamoto M.D.   On: 04/02/2020 14:44   CT Angio Chest PE W and/or Wo Contrast  Result Date: 03/26/2020 CLINICAL DATA:  [place] 1 week ago, shortness of breath, recently discharged from hospital for COVID-19, clinically high suspicion of pulmonary embolism; past history of atrial fibrillation, stage IV breast cancer, non-Hodgkin's lymphoma, multinodular goiter EXAM: CT ANGIOGRAPHY CHEST WITH CONTRAST TECHNIQUE: Multidetector CT imaging of the chest was performed using the standard protocol during bolus administration of intravenous contrast. Multiplanar CT image reconstructions and MIPs were obtained to evaluate the vascular anatomy. CONTRAST:  58mL OMNIPAQUE IOHEXOL 350 MG/ML SOLN IV COMPARISON:  05/08/2019 FINDINGS: Cardiovascular: Atherosclerotic calcifications aorta, proximal great vessels and coronary arteries. Aneurysmal dilatation of ascending thoracic aorta 4.4 cm diameter. Minimal enlargement of cardiac chambers. No pericardial effusion. Pulmonary arteries adequately opacified and patent. No evidence of pulmonary embolism. Mediastinum/Nodes: RIGHT breast prosthesis. Esophagus contains air throughout a portion of its length. Enlarged multinodular appearing thyroid lobes with asymmetric enlargement of LEFT lobe. No thoracic adenopathy. Lungs/Pleura: Patchy infiltrates in the lungs bilaterally consistent with multifocal pneumonia and history of COVID-19. Scattered respiratory motion artifacts. Mild central peribronchial thickening. Biapical scarring. No pleural effusion or pneumothorax. Upper Abdomen: 13 mm diameter cyst RIGHT lobe liver unchanged. Remaining visualized upper abdomen unremarkable. Musculoskeletal: Scattered degenerative disc disease changes of thoracic spine. Review of the MIP images confirms the above findings. IMPRESSION: No evidence of pulmonary embolism. Patchy infiltrates in the lungs bilaterally consistent with  multifocal pneumonia and history of COVID-19. Enlarged multinodular appearing thyroid lobes asymmetrically greater on LEFT similar to previous exam; this was previously assessed by thyroid ultrasound in September 2021 with recommendation for annual follow-up to demonstrate a total of 5 years of stability for multiple nodules. Please refer to report of that exam. (Ref: J Am Coll Radiol. 2015 Feb;12(2): 143-50 Aortic Atherosclerosis (ICD10-I70.0). Electronically Signed   By: Lavonia Dana M.D.   On: 03/26/2020 15:24   CT LUMBAR SPINE WO CONTRAST  Result Date: 04/02/2020 CLINICAL DATA:  Low back pain after a fall. Increased fracture risk. EXAM: CT LUMBAR SPINE WITHOUT CONTRAST TECHNIQUE: Multidetector CT imaging of the lumbar spine was performed without intravenous contrast administration. Multiplanar CT image reconstructions were also generated. COMPARISON:  11/18/2019 FINDINGS: Segmentation: 5 lumbar type vertebral bodies. Alignment: No traumatic malalignment. Chronic degenerative anterolisthesis at L5-S1 of 3 mm. Vertebrae: No regional fracture or primary bone lesion. Paraspinal and other soft tissues: Aortic atherosclerosis as seen previously. Disc levels: T12-L1: Solid bridging osteophytes.  No stenosis. L1-2: Chronic disc degeneration with vacuum phenomenon. Bulging of the disc with endplate osteophytes. Mild facet osteoarthritis. No compressive central canal stenosis. Mild lateral recess  narrowing. L2-3: Chronic disc degeneration with vacuum phenomenon. Endplate osteophytes and bulging of the disc. Bilateral facet osteoarthritis. Mild stenosis of the lateral recesses and foramina. L3-4: Chronic disc degeneration with vacuum phenomenon. Endplate osteophytes and bulging of the disc. Facet and ligamentous hypertrophy. Severe multifactorial stenosis that could cause neural compression on either or both sides. L4-5: Chronic disc degeneration with vacuum phenomenon. Endplate osteophytes and bulging of the disc.  Facet arthropathy with facet and ligamentous hypertrophy. Chronic stenosis of the canal, lateral recesses and neural foramina that could cause neural compression on either or both sides. L5-S1: Chronic facet arthropathy with 3 mm of anterolisthesis. Degeneration and bulging of the disc. Mild bilateral subarticular lateral recess stenosis. Bilateral foraminal encroachment by osteophyte and disc material could affect either L5 nerve. IMPRESSION: 1. Advanced chronic degenerative disc disease and degenerative facet disease throughout the lumbar region as outlined above. Findings are similar to the study of 11/18/2019. At L3-4 there is severe multifactorial spinal stenosis that could cause neural compression on either or both sides. At L4-5, there is chronic stenosis of the canal, lateral recesses and neural foramina that could cause neural compression on either or both sides. At L5-S1, there is bilateral foraminal encroachment by osteophyte and disc material that could affect either L5 nerve. 2. No acute fracture.  No traumatic malalignment. Aortic Atherosclerosis (ICD10-I70.0). Electronically Signed   By: Nelson Chimes M.D.   On: 04/02/2020 17:44   DG Chest Port 1 View  Result Date: 03/09/2020 CLINICAL DATA:  Shortness of breath.  COVID pneumonia. EXAM: PORTABLE CHEST 1 VIEW COMPARISON:  03/08/2020 FINDINGS: Cardiac enlargement is stable. There is a loop recorder identified in the projection of the left heart border. No signs of pleural effusion or edema. Chronic interstitial coarsening is identified bilaterally. No superimposed airspace consolidation. IMPRESSION: 1. Stable cardiac enlargement. 2. Chronic interstitial coarsening. Electronically Signed   By: Kerby Moors M.D.   On: 03/09/2020 08:50   DG Chest Port 1 View  Result Date: 03/08/2020 CLINICAL DATA:  PT BIBA from home with cough, fever, chills, and SOB. Pt tested positive for COVID 2 days ago. Pt is fully vaccinated with booster. EXAM: PORTABLE CHEST  1 VIEW COMPARISON:  05/28/2019 FINDINGS: Loop recorder noted. Atherosclerotic calcification of the aortic arch. Mild enlargement of the cardiopericardial silhouette. Hazy opacities in the lungs most notably at the left lung base and in the right mid lung are observed. Chronic accentuated lung interstitium. Right breast implant noted contributing to the density along the right hemithorax. Mild rightward tracheal deviation likely due to known left eccentric goiter. IMPRESSION: 1. Hazy opacities in the lungs, left greater than right, favoring indistinct multilobar pneumonia and possibly a manifestation of COVID pneumonia. 2. Mild enlargement of the cardiopericardial silhouette. 3. Chronic accentuated lung interstitium. Electronically Signed   By: Van Clines M.D.   On: 03/08/2020 12:00   ECHOCARDIOGRAM LIMITED  Result Date: 03/09/2020    ECHOCARDIOGRAM LIMITED REPORT   Patient Name:   Selena [name] Date of Exam: 03/09/2020 Medical Rec #:  [id number]      Height:       66.3 in Accession #:    [id number]     Weight:       197.5 lb Date of Birth:  [birth year]      BSA:          1.995 m Patient Age:    12 years       BP:  140/91 mmHg Patient Gender: F              HR:           77 bpm. Exam Location:  Inpatient Procedure: Limited Echo, Cardiac Doppler and Color Doppler Indications:    CHF-Acute diastolic  History:        Patient has prior history of Echocardiogram examinations, most                 recent 09/07/2017. CHF, Arrythmias:Atrial Fibrillation; Risk                 Factors:Former Smoker and Hypertension. COVID-19. S/P ablation                 of atrial flutter.  Sonographer:    Clayton Lefort RDCS (AE) Referring Phys: [id number] Lequita Halt  Sonographer Comments: COVID-19. IMPRESSIONS  1. Left ventricular ejection fraction, by estimation, is 55%. The left ventricle has normal function. The left ventricle has no regional wall motion abnormalities. There is mild left ventricular hypertrophy. Left  ventricular diastolic parameters are indeterminate.  2. Right ventricular systolic function is normal. The right ventricular size is normal. Tricuspid regurgitation signal is inadequate for assessing PA pressure.  3. Left atrial size was moderately dilated.  4. Right atrial size was moderately dilated.  5. The mitral valve is normal in structure. Trivial mitral valve regurgitation. No evidence of mitral stenosis.  6. The aortic valve is tricuspid. Aortic valve regurgitation is not visualized. Mild aortic valve sclerosis is present, with no evidence of aortic valve stenosis.  7. The inferior vena cava is normal in size with greater than 50% respiratory variability, suggesting right atrial pressure of 3 mmHg.  8. The patient was in atrial fibrillation. FINDINGS  Left Ventricle: Left ventricular ejection fraction, by estimation, is 55%. The left ventricle has normal function. The left ventricle has no regional wall motion abnormalities. The left ventricular internal cavity size was normal in size. There is mild left ventricular hypertrophy. Left ventricular diastolic parameters are indeterminate. Right Ventricle: The right ventricular size is normal. No increase in right ventricular wall thickness. Right ventricular systolic function is normal. Tricuspid regurgitation signal is inadequate for assessing PA pressure. Left Atrium: Left atrial size was moderately dilated. Right Atrium: Right atrial size was moderately dilated. Mitral Valve: The mitral valve is normal in structure. Mild mitral annular calcification. Trivial mitral valve regurgitation. No evidence of mitral valve stenosis. Aortic Valve: The aortic valve is tricuspid. Aortic valve regurgitation is not visualized. Mild aortic valve sclerosis is present, with no evidence of aortic valve stenosis. Aortic valve mean gradient measures 3.0 mmHg. Aortic valve peak gradient measures 4.9 mmHg. Aortic valve area, by VTI measures 2.04 cm. Pulmonic Valve: The pulmonic  valve was normal in structure. Pulmonic valve regurgitation is not visualized. Aorta: The aortic root is normal in size and structure. Venous: The inferior vena cava is normal in size with greater than 50% respiratory variability, suggesting right atrial pressure of 3 mmHg. LEFT VENTRICLE PLAX 2D LVIDd:         4.50 cm  Diastology LVIDs:         3.30 cm  LV e' medial:    6.85 cm/s LV PW:         1.30 cm  LV E/e' medial:  15.6 LV IVS:        1.40 cm  LV e' lateral:   10.90 cm/s LVOT diam:     1.90 cm  LV  E/e' lateral: 9.8 LV SV:         44 LV SV Index:   22 LVOT Area:     2.84 cm  IVC IVC diam: 1.00 cm LEFT ATRIUM         Index LA diam:    3.90 cm 1.95 cm/m  AORTIC VALVE AV Area (Vmax):    2.11 cm AV Area (Vmean):   2.00 cm AV Area (VTI):     2.04 cm AV Vmax:           111.00 cm/s AV Vmean:          75.300 cm/s AV VTI:            0.215 m AV Peak Grad:      4.9 mmHg AV Mean Grad:      3.0 mmHg LVOT Vmax:         82.70 cm/s LVOT Vmean:        53.100 cm/s LVOT VTI:          0.155 m LVOT/AV VTI ratio: 0.72  AORTA Ao Root diam: 3.30 cm Ao Asc diam:  3.30 cm MITRAL VALVE MV Area (PHT): 2.46 cm     SHUNTS MV Decel Time: 309 msec     Systemic VTI:  0.16 m MV E velocity: 107.00 cm/s  Systemic Diam: 1.90 cm MV A velocity: 27.90 cm/s MV E/A ratio:  3.84 Loralie Champagne MD Electronically signed by Loralie Champagne MD Signature Date/Time: 03/09/2020/2:29:21 PM    Final    (Echo, Carotid, EGD, Colonoscopy, ERCP)    Subjective: Patient seen and examined.  Her son was at the bedside.  We discussed about her mobility, may need help at home and therapies.  Patient has mild pain on her right side chest, otherwise mostly asymptomatic today.  Denies any nausea vomiting.  Able to take deep breaths and using incentive spirometry. Patient wants to go home.  She thinks he will do better at home.   Discharge Exam: Vitals:   04/06/20 0413 04/06/20 0901  BP: 139/79 126/88  Pulse: 73 77  Resp: 20   Temp: 98.1 F (36.7 C)   SpO2:  95%    Vitals:   04/06/20 0003 04/06/20 0411 04/06/20 0413 04/06/20 0901  BP: 133/76  139/79 126/88  Pulse: 73  73 77  Resp: 19  20   Temp: 98 F (36.7 C)  98.1 F (36.7 C)   TempSrc: Oral  Oral   SpO2: 98%  95%   Weight:  86.7 kg    Height:        General: Pt is alert, awake, not in acute distress On room air.  Comfortable.  Sitting in the couch. Cardiovascular: RRR, S1/S2 +, no rubs, no gallops Respiratory: CTA bilaterally, no wheezing, no rhonchi, no added sounds. Abdominal: Soft, NT, ND, bowel sounds + Extremities: no edema, no cyanosis    The results of significant diagnostics from this hospitalization (including imaging, microbiology, ancillary and laboratory) are listed below for reference.     Microbiology: Recent Results (from the past 240 hour(s))  Blood culture (routine x 2)     Status: Abnormal   Collection Time: 04/02/20 12:56 PM   Specimen: BLOOD  Result Value Ref Range Status   Specimen Description BLOOD LEFT ANTECUBITAL  Final   Special Requests   Final    BOTTLES DRAWN AEROBIC AND ANAEROBIC Blood Culture adequate volume   Culture  Setup Time   Final    IN BOTH AEROBIC AND  ANAEROBIC BOTTLES GRAM NEGATIVE RODS CRITICAL VALUE NOTED.  VALUE IS CONSISTENT WITH PREVIOUSLY REPORTED AND CALLED VALUE.    Culture (A)  Final    ESCHERICHIA COLI SUSCEPTIBILITIES PERFORMED ON PREVIOUS CULTURE WITHIN THE LAST 5 DAYS. Performed at [facility], [place] [address], [place], [place] [address]    Report Status 04/05/2020 FINAL  Final  Blood culture (routine x 2)     Status: Abnormal   Collection Time: 04/02/20  1:01 PM   Specimen: BLOOD LEFT FOREARM  Result Value Ref Range Status   Specimen Description BLOOD LEFT FOREARM  Final   Special Requests   Final    BOTTLES DRAWN AEROBIC AND ANAEROBIC Blood Culture adequate volume   Culture  Setup Time   Final    IN BOTH AEROBIC AND ANAEROBIC BOTTLES GRAM NEGATIVE RODS CRITICAL RESULT CALLED TO, READ BACK BY AND  VERIFIED WITH: T,JACKY PHARMD @0834  04/03/20 EB Performed at [facility], [place] [address], [place], [place] [address]    Culture ESCHERICHIA COLI (A)  Final   Report Status 04/05/2020 FINAL  Final   Organism ID, Bacteria ESCHERICHIA COLI  Final      Susceptibility   Escherichia coli - MIC*    AMPICILLIN <=2 SENSITIVE Sensitive     CEFAZOLIN <=4 SENSITIVE Sensitive     CEFEPIME <=0.12 SENSITIVE Sensitive     CEFTAZIDIME <=1 SENSITIVE Sensitive     CEFTRIAXONE <=0.25 SENSITIVE Sensitive     CIPROFLOXACIN <=0.25 SENSITIVE Sensitive     GENTAMICIN <=1 SENSITIVE Sensitive     IMIPENEM <=0.25 SENSITIVE Sensitive     TRIMETH/SULFA <=20 SENSITIVE Sensitive     AMPICILLIN/SULBACTAM <=2 SENSITIVE Sensitive     PIP/TAZO <=4 SENSITIVE Sensitive     * ESCHERICHIA COLI  Blood Culture ID Panel (Reflexed)     Status: Abnormal   Collection Time: 04/02/20  1:01 PM  Result Value Ref Range Status   Enterococcus faecalis NOT DETECTED NOT DETECTED Final   Enterococcus Faecium NOT DETECTED NOT DETECTED Final   Listeria monocytogenes NOT DETECTED NOT DETECTED Final   Staphylococcus species NOT DETECTED NOT DETECTED Final   Staphylococcus aureus (BCID) NOT DETECTED NOT DETECTED Final   Staphylococcus epidermidis NOT DETECTED NOT DETECTED Final   Staphylococcus lugdunensis NOT DETECTED NOT DETECTED Final   Streptococcus species NOT DETECTED NOT DETECTED Final   Streptococcus agalactiae NOT DETECTED NOT DETECTED Final   Streptococcus pneumoniae NOT DETECTED NOT DETECTED Final   Streptococcus pyogenes NOT DETECTED NOT DETECTED Final   A.calcoaceticus-baumannii NOT DETECTED NOT DETECTED Final   Bacteroides fragilis NOT DETECTED NOT DETECTED Final   Enterobacterales DETECTED (A) NOT DETECTED Final    Comment: Enterobacterales represent a large order of gram negative bacteria, not a single organism. CRITICAL RESULT CALLED TO, READ BACK BY AND VERIFIED WITH: T,JACKY PHARMD @0834  04/03/20 EB     Enterobacter cloacae complex NOT DETECTED NOT DETECTED Final   Escherichia coli DETECTED (A) NOT DETECTED Final    Comment: CRITICAL RESULT CALLED TO, READ BACK BY AND VERIFIED WITH: T,JACKY PHARMD @0834  04/03/20 EB    Klebsiella aerogenes NOT DETECTED NOT DETECTED Final   Klebsiella oxytoca NOT DETECTED NOT DETECTED Final   Klebsiella pneumoniae NOT DETECTED NOT DETECTED Final   Proteus species NOT DETECTED NOT DETECTED Final   Salmonella species NOT DETECTED NOT DETECTED Final   Serratia marcescens NOT DETECTED NOT DETECTED Final   Haemophilus influenzae NOT DETECTED NOT DETECTED Final   Neisseria meningitidis NOT DETECTED NOT DETECTED Final  Pseudomonas aeruginosa NOT DETECTED NOT DETECTED Final   Stenotrophomonas maltophilia NOT DETECTED NOT DETECTED Final   Candida albicans NOT DETECTED NOT DETECTED Final   Candida auris NOT DETECTED NOT DETECTED Final   Candida glabrata NOT DETECTED NOT DETECTED Final   Candida krusei NOT DETECTED NOT DETECTED Final   Candida parapsilosis NOT DETECTED NOT DETECTED Final   Candida tropicalis NOT DETECTED NOT DETECTED Final   Cryptococcus neoformans/gattii NOT DETECTED NOT DETECTED Final   CTX-M ESBL NOT DETECTED NOT DETECTED Final   Carbapenem resistance IMP NOT DETECTED NOT DETECTED Final   Carbapenem resistance KPC NOT DETECTED NOT DETECTED Final   Carbapenem resistance NDM NOT DETECTED NOT DETECTED Final   Carbapenem resist OXA 48 LIKE NOT DETECTED NOT DETECTED Final   Carbapenem resistance VIM NOT DETECTED NOT DETECTED Final    Comment: Performed at [facility], [address], [place], [place] [address]  Urine culture     Status: Abnormal   Collection Time: 04/02/20  3:02 PM   Specimen: Urine, Random  Result Value Ref Range Status   Specimen Description URINE, RANDOM  Final   Special Requests   Final    NONE Performed at [facility], [place] [address] [place], [place] [address]    Culture >=100,000 COLONIES/mL ESCHERICHIA  COLI (A)  Final   Report Status 04/05/2020 FINAL  Final   Organism ID, Bacteria ESCHERICHIA COLI (A)  Final      Susceptibility   Escherichia coli - MIC*    AMPICILLIN <=2 SENSITIVE Sensitive     CEFAZOLIN <=4 SENSITIVE Sensitive     CEFEPIME <=0.12 SENSITIVE Sensitive     CEFTRIAXONE <=0.25 SENSITIVE Sensitive     CIPROFLOXACIN <=0.25 SENSITIVE Sensitive     GENTAMICIN <=1 SENSITIVE Sensitive     IMIPENEM <=0.25 SENSITIVE Sensitive     NITROFURANTOIN <=16 SENSITIVE Sensitive     TRIMETH/SULFA <=20 SENSITIVE Sensitive     AMPICILLIN/SULBACTAM <=2 SENSITIVE Sensitive     PIP/TAZO <=4 SENSITIVE Sensitive     * >=100,000 COLONIES/mL ESCHERICHIA COLI  SARS CORONAVIRUS 2 (TAT 6-24 HRS) Nasopharyngeal Nasopharyngeal Swab     Status: Abnormal   Collection Time: 04/02/20 11:08 PM   Specimen: Nasopharyngeal Swab  Result Value Ref Range Status   SARS Coronavirus 2 POSITIVE (A) NEGATIVE Final    Comment: (NOTE) SARS-CoV-2 target nucleic acids are DETECTED.  The SARS-CoV-2 RNA is generally detectable in upper and lower respiratory specimens during the acute phase of infection. Positive results are indicative of the presence of SARS-CoV-2 RNA. Clinical correlation with patient history and other diagnostic information is  necessary to determine patient infection status. Positive results do not rule out bacterial infection or co-infection with other viruses.  The expected result is Negative.  Fact Sheet for Patients: SugarRoll.be  Fact Sheet for Healthcare Providers: https://www.woods-mathews.com/  This test is not yet approved or cleared by the [place] FDA and  has been authorized for detection and/or diagnosis of SARS-CoV-2 by FDA under an Emergency Use Authorization (EUA). This EUA will remain  in effect (meaning this test can be used) for the duration of the COVID-19 declaration under Section 564(b)(1) of the Act, 21 U. S.C. section  360bbb-3(b)(1), unless the authorization is terminated or revoked sooner.   Performed at [facility], [place] [address], [place], [place] [address]      Labs: BNP (last 3 results) Recent Labs    03/08/20 1141  BNP 458.0*   Basic Metabolic Panel:  Recent Labs  Lab 04/02/20 1133 04/03/20 0253 04/04/20 0359 04/05/20 0531  NA 136 139 141 141  K 3.7 3.3* 3.6 3.5  CL 99 104 108 107  CO2 23 25 25 24   GLUCOSE 134* 143* 120* 87  BUN 31* 23 20 15   CREATININE 1.45* 0.96 0.69 0.66  CALCIUM 8.9 8.5* 8.8* 8.7*  MG  --   --   --  2.2  PHOS  --   --   --  3.6   Liver Function Tests: Recent Labs  Lab 04/02/20 1133 04/04/20 0359  AST 35 18  ALT 39 25  ALKPHOS 105 87  BILITOT 1.4* 0.7  PROT 5.5* 4.8*  ALBUMIN 3.0* 2.3*   No results for input(s): LIPASE, AMYLASE in the last 168 hours. No results for input(s): AMMONIA in the last 168 hours. CBC: Recent Labs  Lab 04/02/20 1133 04/03/20 0253 04/04/20 0359 04/05/20 0531 04/06/20 0350  WBC 50.6* 36.8* 21.6* 14.2* 11.9*  NEUTROABS  --   --   --  10.1* 7.8*  HGB 14.9 13.3 13.1 14.0 13.2  HCT 46.1* 40.5 39.7 41.2 39.4  MCV 96.6 94.4 93.9 92.8 92.5  PLT 164 96* 91* 99* 115*   Cardiac Enzymes: No results for input(s): CKTOTAL, CKMB, CKMBINDEX, TROPONINI in the last 168 hours. BNP: Invalid input(s): POCBNP CBG: Recent Labs  Lab 04/02/20 1453  GLUCAP 122*   D-Dimer No results for input(s): DDIMER in the last 72 hours. Hgb A1c No results for input(s): HGBA1C in the last 72 hours. Lipid Profile No results for input(s): CHOL, HDL, LDLCALC, TRIG, CHOLHDL, LDLDIRECT in the last 72 hours. Thyroid function studies No results for input(s): TSH, T4TOTAL, T3FREE, THYROIDAB in the last 72 hours.  Invalid input(s): FREET3 Anemia work up No results for input(s): VITAMINB12, FOLATE, FERRITIN, TIBC, IRON, RETICCTPCT in the last 72 hours. Urinalysis    Component Value Date/Time   COLORURINE YELLOW 04/02/2020 1455    APPEARANCEUR CLOUDY (A) 04/02/2020 1455   LABSPEC 1.012 04/02/2020 1455   PHURINE 5.0 04/02/2020 1455   GLUCOSEU NEGATIVE 04/02/2020 1455   GLUCOSEU NEGATIVE 12/26/2011 1110   HGBUR LARGE (A) 04/02/2020 1455   BILIRUBINUR NEGATIVE 04/02/2020 1455   BILIRUBINUR N 04/19/2017 1604   KETONESUR NEGATIVE 04/02/2020 1455   PROTEINUR 30 (A) 04/02/2020 1455   UROBILINOGEN 0.2 04/19/2017 1604   UROBILINOGEN 0.2 12/26/2011 1110   NITRITE NEGATIVE 04/02/2020 1455   LEUKOCYTESUR LARGE (A) 04/02/2020 1455   Sepsis Labs Invalid input(s): PROCALCITONIN,  WBC,  LACTICIDVEN Microbiology Recent Results (from the past 240 hour(s))  Blood culture (routine x 2)     Status: Abnormal   Collection Time: 04/02/20 12:56 PM   Specimen: BLOOD  Result Value Ref Range Status   Specimen Description BLOOD LEFT ANTECUBITAL  Final   Special Requests   Final    BOTTLES DRAWN AEROBIC AND ANAEROBIC Blood Culture adequate volume   Culture  Setup Time   Final    IN BOTH AEROBIC AND ANAEROBIC BOTTLES GRAM NEGATIVE RODS CRITICAL VALUE NOTED.  VALUE IS CONSISTENT WITH PREVIOUSLY REPORTED AND CALLED VALUE.    Culture (A)  Final    ESCHERICHIA COLI SUSCEPTIBILITIES PERFORMED ON PREVIOUS CULTURE WITHIN THE LAST 5 DAYS. Performed at [facility], [place] [address], [place], [place] [address]    Report Status 04/05/2020 FINAL  Final  Blood culture (routine x 2)     Status: Abnormal   Collection Time: 04/02/20  1:01 PM   Specimen: BLOOD LEFT FOREARM  Result Value Ref Range Status   Specimen Description BLOOD LEFT FOREARM  Final   Special Requests   Final    BOTTLES DRAWN AEROBIC AND ANAEROBIC Blood Culture adequate volume   Culture  Setup Time   Final    IN BOTH AEROBIC AND ANAEROBIC BOTTLES GRAM NEGATIVE RODS CRITICAL RESULT CALLED TO, READ BACK BY AND VERIFIED WITH: T,JACKY PHARMD @0834  04/03/20 EB Performed at [facility], [place] [address], [place], [place] [address]    Culture ESCHERICHIA COLI (A)  Final    Report Status 04/05/2020 FINAL  Final   Organism ID, Bacteria ESCHERICHIA COLI  Final      Susceptibility   Escherichia coli - MIC*    AMPICILLIN <=2 SENSITIVE Sensitive     CEFAZOLIN <=4 SENSITIVE Sensitive     CEFEPIME <=0.12 SENSITIVE Sensitive     CEFTAZIDIME <=1 SENSITIVE Sensitive     CEFTRIAXONE <=0.25 SENSITIVE Sensitive     CIPROFLOXACIN <=0.25 SENSITIVE Sensitive     GENTAMICIN <=1 SENSITIVE Sensitive     IMIPENEM <=0.25 SENSITIVE Sensitive     TRIMETH/SULFA <=20 SENSITIVE Sensitive     AMPICILLIN/SULBACTAM <=2 SENSITIVE Sensitive     PIP/TAZO <=4 SENSITIVE Sensitive     * ESCHERICHIA COLI  Blood Culture ID Panel (Reflexed)     Status: Abnormal   Collection Time: 04/02/20  1:01 PM  Result Value Ref Range Status   Enterococcus faecalis NOT DETECTED NOT DETECTED Final   Enterococcus Faecium NOT DETECTED NOT DETECTED Final   Listeria monocytogenes NOT DETECTED NOT DETECTED Final   Staphylococcus species NOT DETECTED NOT DETECTED Final   Staphylococcus aureus (BCID) NOT DETECTED NOT DETECTED Final   Staphylococcus epidermidis NOT DETECTED NOT DETECTED Final   Staphylococcus lugdunensis NOT DETECTED NOT DETECTED Final   Streptococcus species NOT DETECTED NOT DETECTED Final   Streptococcus agalactiae NOT DETECTED NOT DETECTED Final   Streptococcus pneumoniae NOT DETECTED NOT DETECTED Final   Streptococcus pyogenes NOT DETECTED NOT DETECTED Final   A.calcoaceticus-baumannii NOT DETECTED NOT DETECTED Final   Bacteroides fragilis NOT DETECTED NOT DETECTED Final   Enterobacterales DETECTED (A) NOT DETECTED Final    Comment: Enterobacterales represent a large order of gram negative bacteria, not a single organism. CRITICAL RESULT CALLED TO, READ BACK BY AND VERIFIED WITH: T,JACKY PHARMD @0834  04/03/20 EB    Enterobacter cloacae complex NOT DETECTED NOT DETECTED Final   Escherichia coli DETECTED (A) NOT DETECTED Final    Comment: CRITICAL RESULT CALLED TO, READ BACK BY AND  VERIFIED WITH: T,JACKY PHARMD @0834  04/03/20 EB    Klebsiella aerogenes NOT DETECTED NOT DETECTED Final   Klebsiella oxytoca NOT DETECTED NOT DETECTED Final   Klebsiella pneumoniae NOT DETECTED NOT DETECTED Final   Proteus species NOT DETECTED NOT DETECTED Final   Salmonella species NOT DETECTED NOT DETECTED Final   Serratia marcescens NOT DETECTED NOT DETECTED Final   Haemophilus influenzae NOT DETECTED NOT DETECTED Final   Neisseria meningitidis NOT DETECTED NOT DETECTED Final   Pseudomonas aeruginosa NOT DETECTED NOT DETECTED Final   Stenotrophomonas maltophilia NOT DETECTED NOT DETECTED Final   Candida albicans NOT DETECTED NOT DETECTED Final   Candida auris NOT DETECTED NOT DETECTED Final   Candida glabrata NOT DETECTED NOT DETECTED Final   Candida krusei NOT DETECTED NOT DETECTED Final   Candida parapsilosis NOT DETECTED NOT DETECTED Final   Candida tropicalis NOT DETECTED NOT DETECTED Final   Cryptococcus neoformans/gattii NOT DETECTED NOT DETECTED Final   CTX-M ESBL NOT DETECTED NOT DETECTED  Final   Carbapenem resistance IMP NOT DETECTED NOT DETECTED Final   Carbapenem resistance KPC NOT DETECTED NOT DETECTED Final   Carbapenem resistance NDM NOT DETECTED NOT DETECTED Final   Carbapenem resist OXA 48 LIKE NOT DETECTED NOT DETECTED Final   Carbapenem resistance VIM NOT DETECTED NOT DETECTED Final    Comment: Performed at [facility], [place] [address] [place], [place] [address]  Urine culture     Status: Abnormal   Collection Time: 04/02/20  3:02 PM   Specimen: Urine, Random  Result Value Ref Range Status   Specimen Description URINE, RANDOM  Final   Special Requests   Final    NONE Performed at [facility], [place] [address] [place], [place] [address]    Culture >=100,000 COLONIES/mL ESCHERICHIA COLI (A)  Final   Report Status 04/05/2020 FINAL  Final   Organism ID, Bacteria ESCHERICHIA COLI (A)  Final      Susceptibility   Escherichia coli - MIC*     AMPICILLIN <=2 SENSITIVE Sensitive     CEFAZOLIN <=4 SENSITIVE Sensitive     CEFEPIME <=0.12 SENSITIVE Sensitive     CEFTRIAXONE <=0.25 SENSITIVE Sensitive     CIPROFLOXACIN <=0.25 SENSITIVE Sensitive     GENTAMICIN <=1 SENSITIVE Sensitive     IMIPENEM <=0.25 SENSITIVE Sensitive     NITROFURANTOIN <=16 SENSITIVE Sensitive     TRIMETH/SULFA <=20 SENSITIVE Sensitive     AMPICILLIN/SULBACTAM <=2 SENSITIVE Sensitive     PIP/TAZO <=4 SENSITIVE Sensitive     * >=100,000 COLONIES/mL ESCHERICHIA COLI  SARS CORONAVIRUS 2 (TAT 6-24 HRS) Nasopharyngeal Nasopharyngeal Swab     Status: Abnormal   Collection Time: 04/02/20 11:08 PM   Specimen: Nasopharyngeal Swab  Result Value Ref Range Status   SARS Coronavirus 2 POSITIVE (A) NEGATIVE Final    Comment: (NOTE) SARS-CoV-2 target nucleic acids are DETECTED.  The SARS-CoV-2 RNA is generally detectable in upper and lower respiratory specimens during the acute phase of infection. Positive results are indicative of the presence of SARS-CoV-2 RNA. Clinical correlation with patient history and other diagnostic information is  necessary to determine patient infection status. Positive results do not rule out bacterial infection or co-infection with other viruses.  The expected result is Negative.  Fact Sheet for Patients: SugarRoll.be  Fact Sheet for Healthcare Providers: https://www.woods-mathews.com/  This test is not yet approved or cleared by the [place] FDA and  has been authorized for detection and/or diagnosis of SARS-CoV-2 by FDA under an Emergency Use Authorization (EUA). This EUA will remain  in effect (meaning this test can be used) for the duration of the COVID-19 declaration under Section 564(b)(1) of the Act, 21 U. S.C. section 360bbb-3(b)(1), unless the authorization is terminated or revoked sooner.   Performed at [facility], [place] [address], [place], [place] [address]       Time coordinating discharge:  35 minutes  SIGNED:   Barb Merino, MD  [facility] 04/06/2020, 10:20 AM

## 2020-04-07 ENCOUNTER — Telehealth: Payer: Self-pay | Admitting: Family Medicine

## 2020-04-07 NOTE — Telephone Encounter (Signed)
The patient was in the hospital for a UTI then developed Sepsis. She was in the hospital for 5 days.  She has an appointment to follow up with Dr. Ethlyn Gallery on 04/17/2020.  The lady from [facility] wanted to know if the patient can be seen sooner since she's a little short of breath and can't talk for very long.  She also needs a referral for [place].

## 2020-04-07 NOTE — Telephone Encounter (Signed)
Transition Care Management Follow-up Telephone Call  Date of discharge and from where: 04/06/2020  How have you been since you were released from the hospital? Patient's daughter states that she is doing fine.  Any questions or concerns? No  Items Reviewed:  Did the pt receive and understand the discharge instructions provided? Yes   Medications obtained and verified? Yes   Other? No   Any new allergies since your discharge? No   Dietary orders reviewed? Yes  Do you have support at home? Yes   Home Care and Equipment/Supplies: Were home health services ordered? yes If so, what is the name of the agency? [facility]  Has the agency set up a time to come to the patient's home? no Were any new equipment or medical supplies ordered?  No What is the name of the medical supply agency? N/A  Were you able to get the supplies/equipment? not applicable Do you have any questions related to the use of the equipment or supplies? No  Functional Questionnaire: (I = Independent and D = Dependent) ADLs: I  Bathing/Dressing- I  Meal Prep- I  Eating- I  Maintaining continence- I  Transferring/Ambulation- I  Managing Meds- D  Follow up appointments reviewed:   PCP Hospital f/u appt confirmed? No    Specialist Hospital f/u appt confirmed? No    Are transportation arrangements needed? No   If their condition worsens, is the pt aware to call PCP or go to the Emergency Dept.? Yes  Was the patient provided with contact information for the PCP's office or ED? Yes  Was to pt encouraged to call back with questions or concerns? Yes ,

## 2020-04-07 NOTE — Telephone Encounter (Signed)
No name or number available below from [facility].  Spoke with Tula Nakayama, patients granddaughter and advised her due to being hospitalized for sepsis, post Covid and having shortness of breath,  the pt should go back to the hospital.  Janett Billow stated she is sitting with the pt and she has not had any breathing problems today and agreed to seek treatment if needed.  Message sent to PCP.

## 2020-04-08 ENCOUNTER — Other Ambulatory Visit: Payer: Self-pay | Admitting: Family Medicine

## 2020-04-08 ENCOUNTER — Telehealth: Payer: Self-pay | Admitting: Family Medicine

## 2020-04-08 DIAGNOSIS — I10 Essential (primary) hypertension: Secondary | ICD-10-CM

## 2020-04-08 DIAGNOSIS — A4151 Sepsis due to Escherichia coli [E. coli]: Secondary | ICD-10-CM

## 2020-04-08 DIAGNOSIS — U071 COVID-19: Secondary | ICD-10-CM

## 2020-04-08 DIAGNOSIS — R6520 Severe sepsis without septic shock: Secondary | ICD-10-CM

## 2020-04-08 DIAGNOSIS — J1282 Pneumonia due to coronavirus disease 2019: Secondary | ICD-10-CM

## 2020-04-08 NOTE — Progress Notes (Unsigned)
Virtual Visit via Telephone Note  I connected with Selena [name]  on 04/08/20 at 1:00pm by telephone and verified that I am speaking with the correct person using two identifiers.   I discussed the limitations, risks, security and privacy concerns of performing an evaluation and management service by telephone and the availability of in person appointments. I also discussed with the patient that there may be a patient responsible charge related to this service. The patient expressed understanding and agreed to proceed.  Location patient: home Location provider:  [facility]  [place], [place] [address]  Participants present for the call: patient, provider Patient did not have a visit in the prior 7 days to address this/these issue(s).   History of Present Illness: [name] was discharged from the    Observations/Objective: Patient sounds cheerful and well on the phone. I do not appreciate any SOB. Speech and thought processing are grossly intact. Patient reported vitals:  Assessment and Plan:   Follow Up Instructions:  @   [id number] 5-10 [id number] 11-20 9443 21-30 I did not refer this patient for an OV in the next 24 hours for this/these issue(s).  I discussed the assessment and treatment plan with the patient. The patient was provided an opportunity to ask questions and all were answered. The patient agreed with the plan and demonstrated an understanding of the instructions.   The patient was advised to call back or seek an in-person evaluation if the symptoms worsen or if the condition fails to improve as anticipated.  I provided *** minutes of non-face-to-face time during this encounter.   Selena Rough, MD

## 2020-04-08 NOTE — Telephone Encounter (Signed)
Clair Gulling did a [place] today 04/08/2020.  Clair Gulling is requseting verbal orders for Home Health PT  2 x a week for 4 weeks  Referral for Home Health Nurse for diease and medication management.  Referral for Medical Social Worker for [employer]  OT for ADL   Key Concerns:  Faint and variable pulse, she does have Afib  She has brown fleam at night and clear fleam during the day  She has night time chills and sweats a fair amount and happened at the hospital also.  In general she stays colder than usual  Her Bowel movement 4 x daily, range between constipation and loose.   She needs a visit as soon as possible.  Herbert Deaner [place] [phone]

## 2020-04-08 NOTE — Telephone Encounter (Signed)
Spoke with patient; she is just weak, dizzy, short of breath. She has had a little cough; but all sx are improved from previous and she states that she is not worse than when she was in the hospital. She needs help with getting around the house. Needs help with getting up/down from bed, chair, toilet. She has bed side commode, shower chair. Home PT, OT, RN set up already for her. No fevers. Has good family support.   JoAnne - please see if Jackelyn Poling with [facility] (patient's contact) [phone] ext [phone] can have home RN draw patient's bloodwork that I ordered. Looks like her 3/25 appointment was canceled? But I will document today's visit as a follow up and she has in home services in place.

## 2020-04-08 NOTE — Telephone Encounter (Signed)
Sent  It to [place], Christella Scheuermann This is a [employer] with no out of network benefits. I'll, hold on to it until I can pair with with a good payer, if that's okay.   Trying to find someone in her network I will send to [facility]

## 2020-04-10 NOTE — Telephone Encounter (Signed)
Selena [name] is calling from [place] is calling and requesting verbal orders for nursing. Frequency is 1 week 4. Also she has questions about medications and is asking for a call back, please advise. CB is [phone]

## 2020-04-10 NOTE — Telephone Encounter (Signed)
Selena [name] is calling back from [place] and stated that shet has completed patients home health therapy evaluation and is now requesting orders home health OT. Frequency is 1 week 1, 2 week 1, 1 week 1, 2 week 2. CB is [phone]

## 2020-04-10 NOTE — Telephone Encounter (Signed)
They wrote for 3 times daily due to symptomatic fast heart rate.  She is able to check her heart rate at home, and is not tachycardic, and is feeling well, and I think twice a day is sufficient.  It would be helpful to get some feedback on heart rates and blood pressures to help determine proper dose for this medication.

## 2020-04-10 NOTE — Telephone Encounter (Signed)
Spoke with Sharyn Lull and informed her of the approval for verbal orders as below.  Diane stated the pt told her she takes Metoprolol BID,  although instructions on the bottle stated to take TID and this is how she was taking the medication while hospitalized.  Message sent to PCP.

## 2020-04-10 NOTE — Telephone Encounter (Signed)
[facility] for new orders

## 2020-04-13 ENCOUNTER — Telehealth: Payer: Self-pay | Admitting: Family Medicine

## 2020-04-13 ENCOUNTER — Other Ambulatory Visit: Payer: Self-pay | Admitting: Family Medicine

## 2020-04-13 MED ORDER — SPACER/AERO-HOLDING CHAMBERS DEVI: 1.0000 | Freq: Every day | 0 refills | Status: DC | PRN

## 2020-04-13 NOTE — Telephone Encounter (Signed)
Spoke with Selena [name] and informed her of the message below.   She stated the pt was sent a BP cuff by an organization,not sure of the name in which the readings are sent and monitored.  Message sent to PCP.

## 2020-04-13 NOTE — Telephone Encounter (Signed)
Well is someone can record and send to me that would be more helpful unless they are getting sent to a cardiologist that is working with her? Or at least if they can get [place] name of organization so they can forward to [place].

## 2020-04-13 NOTE — Telephone Encounter (Signed)
Selena [name] stated the nurses will check with the pharmacy for the pts spacer that was sent in.

## 2020-04-13 NOTE — Telephone Encounter (Signed)
Spacer sent to [facility]. I can give better feedback about metoprolol once we know how pressure/heart rate are doing.

## 2020-04-13 NOTE — Telephone Encounter (Signed)
Diane is calling to see if she can get the pts dosage for Rx metoprolol tartrate (LOPRESSOR) and to see if they can get a spacer for pts inhaler.  May leave a detail msg on her secured voice mail

## 2020-04-13 NOTE — Telephone Encounter (Signed)
See prior message in which Diane was informed of instructions for the medication.  Message sent to PCP.

## 2020-04-13 NOTE — Telephone Encounter (Signed)
Left a detailed message on Diane's voicemail with the information below.

## 2020-04-16 ENCOUNTER — Telehealth: Payer: Self-pay | Admitting: Family Medicine

## 2020-04-16 NOTE — Telephone Encounter (Signed)
Kelly from call and stated pt Bp is low and sitting 100/64 standing 102/66 at the end of visit 96/64. Claiborne Billings want to know bp parameter are you ok with .Claiborne Billings # is [phone].

## 2020-04-17 ENCOUNTER — Inpatient Hospital Stay: Payer: Medicare Other | Admitting: Family Medicine

## 2020-04-17 NOTE — Telephone Encounter (Signed)
I would suggest cutting back amlodipine to half tab if pressures have all been less than 711 systolic. Try this through weekend and then let me know how things look next week. Shortness of breath should gradually be improving. If not, then she needs to be evaluated.

## 2020-04-17 NOTE — Telephone Encounter (Signed)
I would prefer her to be over 991 systolic and over 60 diastolic (let me know if dropping lower than these numbers). How is her heart rate? That was one of main treatment issues from hospital. If she is consistently this low with blood pressure (and if heart rate is doing ok) we can adjust medication. I would prefer to see her closer to 120/70. Please make sure eating/drinking ok. If they can update next set of blood pressures with heart rates, that would be helpful (and also let me know time of medication dosing in relation to those readings)

## 2020-04-17 NOTE — Telephone Encounter (Signed)
Selena [name] informed of the message below.  Selena [name] questioned when to cut back the medication as BP flucuates?  Stated previous readings are as below: 3/24-initially 96/64 and 100/62 3/23-138/76 3/21-110/66 3/18-124/80 3/17-112/64 3/16-130/80  Message sent to PCP.

## 2020-04-17 NOTE — Telephone Encounter (Signed)
Rest of those numbers look a little better. I think just keep checking at home; keep doses the same unless she is still less than 494 systolic today and if so then can just start 1/2 amlodipine. Also just make sure not feeling dizzy/light headed. Hopefully pressure will be back up today. Happy to work with small med adjustments and respond to those bp reports. Thanks!

## 2020-04-17 NOTE — Telephone Encounter (Signed)
Left a message for Selena [name] to return my call.

## 2020-04-17 NOTE — Telephone Encounter (Signed)
Claiborne Billings called back and stated the pts heart rate is WNL with rest, and has noticed increase with activity of 114.  Stated the complains of shortness of breath, pts oxygen is above 90 and she is eating and drinking OK.  Claiborne Billings stated she advised the pt to increase fluid intake, will see her next week and report numbers as below.  Message sent to PCP.

## 2020-04-17 NOTE — Telephone Encounter (Signed)
Claiborne Billings informed of the message below.

## 2020-04-26 ENCOUNTER — Other Ambulatory Visit: Payer: Self-pay | Admitting: Cardiovascular Disease

## 2020-04-27 ENCOUNTER — Other Ambulatory Visit: Payer: Self-pay | Admitting: Family Medicine

## 2020-04-27 ENCOUNTER — Telehealth: Payer: Self-pay | Admitting: Family Medicine

## 2020-04-27 DIAGNOSIS — R059 Cough, unspecified: Secondary | ICD-10-CM

## 2020-04-27 MED ORDER — ALBUTEROL SULFATE HFA 108 (90 BASE) MCG/ACT IN AERS
2.0000 | INHALATION_SPRAY | Freq: Four times a day (QID) | RESPIRATORY_TRACT | 5 refills | Status: DC | PRN
Start: 2020-04-27 — End: 2020-08-19

## 2020-04-27 NOTE — Telephone Encounter (Signed)
Patient needs refills on   albuterol (VENTOLIN HFA) 108 (90 Base) MCG/ACT inhaler 2 puff   [id number] [address], [place] AT [address] [place] Phone:  [phone]  Fax:  [phone]

## 2020-04-27 NOTE — Telephone Encounter (Signed)
Pt's age 85, wt 86.7 kg, SCr 0.69, CrCl 86.04, last ov w/ GT 11/29/19.

## 2020-04-27 NOTE — Telephone Encounter (Signed)
done

## 2020-04-29 ENCOUNTER — Telehealth: Payer: Self-pay | Admitting: Family Medicine

## 2020-04-29 NOTE — Telephone Encounter (Signed)
error 

## 2020-04-30 ENCOUNTER — Telehealth: Payer: Self-pay | Admitting: Family Medicine

## 2020-04-30 NOTE — Telephone Encounter (Signed)
Herbert Deaner is calling and wanted to let provider know that patient needs a HFU. Also they needed an undated medication list to be faxed to [phone]. Caller states that atient is doing well in PT and needs more visits. Therapist is requesting orders for more PT for 1 week 5, please advise. CB is [phone]

## 2020-04-30 NOTE — Telephone Encounter (Signed)
Selena [name] call and stated pt missed her OT.

## 2020-05-01 ENCOUNTER — Other Ambulatory Visit: Payer: Self-pay

## 2020-05-01 NOTE — Telephone Encounter (Signed)
Left a detailed message on Selena [name]'s voicemail with the approval for orders as below, current med list was faxed to the number below and the pt has an appt scheduled on 4/11.

## 2020-05-01 NOTE — Telephone Encounter (Signed)
Message sent in prior note re this to check in on her.

## 2020-05-01 NOTE — Telephone Encounter (Signed)
Selena [name] for renewal of therapy orders and sending updated med list. Make sure she is doing ok since they also called stating she missed PT today.

## 2020-05-04 ENCOUNTER — Encounter: Payer: Self-pay | Admitting: Family Medicine

## 2020-05-04 ENCOUNTER — Other Ambulatory Visit: Payer: Self-pay

## 2020-05-04 ENCOUNTER — Ambulatory Visit (INDEPENDENT_AMBULATORY_CARE_PROVIDER_SITE_OTHER)
Admission: RE | Admit: 2020-05-04 | Discharge: 2020-05-04 | Disposition: A | Discharge status: Home / Self Care | Payer: Medicare Other | Source: Ambulatory Visit | Attending: Family Medicine | Admitting: Family Medicine

## 2020-05-04 ENCOUNTER — Ambulatory Visit: Payer: Medicare Other | Admitting: Family Medicine

## 2020-05-04 VITALS — BP 100/60 | HR 109 | Temp 98.0°F | Ht 68.0 in | Wt 185.6 lb

## 2020-05-04 DIAGNOSIS — N390 Urinary tract infection, site not specified: Secondary | ICD-10-CM

## 2020-05-04 DIAGNOSIS — R0602 Shortness of breath: Secondary | ICD-10-CM

## 2020-05-04 DIAGNOSIS — Z8616 Personal history of COVID-19: Secondary | ICD-10-CM

## 2020-05-04 DIAGNOSIS — D72829 Elevated white blood cell count, unspecified: Secondary | ICD-10-CM | POA: Diagnosis not present

## 2020-05-04 DIAGNOSIS — E8809 Other disorders of plasma-protein metabolism, not elsewhere classified: Secondary | ICD-10-CM

## 2020-05-04 DIAGNOSIS — R350 Frequency of micturition: Secondary | ICD-10-CM

## 2020-05-04 DIAGNOSIS — R059 Cough, unspecified: Secondary | ICD-10-CM

## 2020-05-04 DIAGNOSIS — I10 Essential (primary) hypertension: Secondary | ICD-10-CM | POA: Diagnosis not present

## 2020-05-04 DIAGNOSIS — G4730 Sleep apnea, unspecified: Secondary | ICD-10-CM

## 2020-05-04 LAB — CBC WITH DIFFERENTIAL/PLATELET
Basophils Absolute: 0.1 10*3/uL (ref 0.0–0.1)
Basophils Relative: 0.7 % (ref 0.0–3.0)
Eosinophils Absolute: 0.1 10*3/uL (ref 0.0–0.7)
Eosinophils Relative: 0.6 % (ref 0.0–5.0)
HCT: 41.6 % (ref 36.0–46.0)
Hemoglobin: 13.7 g/dL (ref 12.0–15.0)
Lymphocytes Relative: 10.5 % — ABNORMAL LOW (ref 12.0–46.0)
Lymphs Abs: 1.5 10*3/uL (ref 0.7–4.0)
MCHC: 32.8 g/dL (ref 30.0–36.0)
MCV: 92 fl (ref 78.0–100.0)
Monocytes Absolute: 1.4 10*3/uL — ABNORMAL HIGH (ref 0.1–1.0)
Monocytes Relative: 9.8 % (ref 3.0–12.0)
Neutro Abs: 11.3 10*3/uL — ABNORMAL HIGH (ref 1.4–7.7)
Neutrophils Relative %: 78.4 % — ABNORMAL HIGH (ref 43.0–77.0)
Platelets: 190 10*3/uL (ref 150.0–400.0)
RBC: 4.52 Mil/uL (ref 3.87–5.11)
RDW: 15.8 % — ABNORMAL HIGH (ref 11.5–15.5)
WBC: 14.4 10*3/uL — ABNORMAL HIGH (ref 4.0–10.5)

## 2020-05-04 LAB — COMPREHENSIVE METABOLIC PANEL
ALT: 11 U/L (ref 0–35)
AST: 14 U/L (ref 0–37)
Albumin: 3.6 g/dL (ref 3.5–5.2)
Alkaline Phosphatase: 91 U/L (ref 39–117)
BUN: 11 mg/dL (ref 6–23)
CO2: 27 mEq/L (ref 19–32)
Calcium: 9 mg/dL (ref 8.4–10.5)
Chloride: 104 mEq/L (ref 96–112)
Creatinine, Ser: 0.53 mg/dL (ref 0.40–1.20)
GFR: 86.05 mL/min (ref >60.00)
Glucose, Bld: 79 mg/dL (ref 70–99)
Potassium: 3.7 mEq/L (ref 3.5–5.1)
Sodium: 143 mEq/L (ref 135–145)
Total Bilirubin: 0.9 mg/dL (ref 0.2–1.2)
Total Protein: 5.4 g/dL — ABNORMAL LOW (ref 6.0–8.3)

## 2020-05-04 LAB — BRAIN NATRIURETIC PEPTIDE: Pro B Natriuretic peptide (BNP): 278 pg/mL — ABNORMAL HIGH (ref 0.0–100.0)

## 2020-05-04 LAB — PHOSPHORUS: Phosphorus: 3.6 mg/dL (ref 2.3–4.6)

## 2020-05-04 LAB — FERRITIN: Ferritin: 141.6 ng/mL (ref 10.0–291.0)

## 2020-05-04 NOTE — Progress Notes (Signed)
Selena [name] DOB: [birth year] Encounter date: 05/04/2020  This is a 85 y.o. female who presents with Chief Complaint  Patient presents with  . Hospitalization Follow-up    History of present illness: Stlil with shortness of breath, pneumonia, tired.  She is here today with her sister.  Doesn't feel like breathing is getting better. Throwing up a lot of phlegm.  Just feeling terribly overall.  Wondering if she should use the oxygen when feeling short of breath. She was supposed to get a cpap machine; but hasn't gotten this yet. Did have sleep test December 2021 and qualified for machine; but due to national shortage this hasn't come yet.  Short of breath with just talking. She does use inhalers and feels like they help; but makes her get into coughing fit. No fevers. Has night sweats sometimes - feels worse lately.   Appetite is ok; she is not hungry but making self eat. She has prepackaged food. Daughter cooks dinner for her.   She is taking shower on her own; gets in bed by herself.   She does not have pain in her chest - sometimes gets palpitations but hasn't done this more than a couple of times.   She is still getting PT - one is coming tomorrow; usually 2/week.   No blood in urine or stool. She does have hemorrhoids. Using wipes from daughter for hemorrhoids. Taking metamucil for bowels.   She does have some swelling in legs. Couldn't get compression socks on today.   bp usually running 112/70; 114/60's.   Allergies  Allergen Reactions  . Codeine Phosphate Other (See Comments)    Hallucinations  . Adhesive [Tape]     Electrodes for EKG- skin blistering, erythema  . Codeine Other (See Comments)    Hallucinations    Current Meds  Medication Sig  . albuterol (VENTOLIN HFA) 108 (90 Base) MCG/ACT inhaler Inhale 2 puffs into the lungs every 6 (six) hours as needed for wheezing or shortness of breath.  [name] amLODipine (NORVASC) 5 MG tablet TAKE 1 TABLET BY MOUTH DAILY  (Patient taking differently: Take 5 mg by mouth daily.)  . ELIQUIS 5 MG TABS tablet TAKE 1 TABLET(5 MG) BY MOUTH TWICE DAILY  . furosemide (LASIX) 20 MG tablet Take 1 tablet (20 mg total) by mouth daily.  . metoprolol tartrate (LOPRESSOR) 50 MG tablet Take 1 tablet (50 mg total) by mouth 3 (three) times daily. MAY TAKE ONE EXTRA TABLET FOR HEART RATE OVER 100.  . montelukast (SINGULAIR) 10 MG tablet TAKE 1 TABLET(10 MG) BY MOUTH AT BEDTIME (Patient taking differently: Take 10 mg by mouth at bedtime.)  . Multiple Vitamins-Minerals (PRESERVISION AREDS 2 PO) Take 2 capsules by mouth daily.  . Multiple Vitamins-Minerals (ZINC PO) Take 1 tablet by mouth every evening.  . pantoprazole (PROTONIX) 40 MG tablet TAKE 1 TABLET(40 MG) BY MOUTH TWICE DAILY (Patient taking differently: Take 40 mg by mouth 2 (two) times daily.)  . Spacer/Aero-Holding Chambers DEVI 1 Device by Does not apply route daily as needed (for use with inhaler).    Review of Systems  Constitutional: Positive for activity change, appetite change, chills and fatigue. Negative for fever.  HENT: Positive for congestion and rhinorrhea.   Respiratory: Positive for cough and shortness of breath. Negative for chest tightness and wheezing.   Cardiovascular: Positive for leg swelling. Negative for chest pain and palpitations.  Genitourinary: Positive for frequency. Negative for difficulty urinating.  Neurological: Negative for headaches.    Objective:  BP  100/60 (BP Location: Left Arm, Patient Position: Sitting, Cuff Size: Large)   Pulse (!) 109   Temp 98 F (36.7 C) (Oral)   Ht 5\' 8"  (1.727 m)   Wt 185 lb 9.6 oz (84.2 kg)   SpO2 96%   BMI 28.22 kg/m   Weight: 185 lb 9.6 oz (84.2 kg)   BP Readings from Last 3 Encounters:  05/04/20 100/60  04/06/20 120/62  04/02/20 101/61   Wt Readings from Last 3 Encounters:  05/04/20 185 lb 9.6 oz (84.2 kg)  04/06/20 191 lb 2.2 oz (86.7 kg)  03/26/20 191 lb (86.6 kg)    Physical  Exam Vitals reviewed.  Constitutional:      General: She is not in acute distress.    Appearance: She is well-developed. She is ill-appearing.  HENT:     Right Ear: Tympanic membrane and ear canal normal.     Left Ear: Tympanic membrane and ear canal normal.     Mouth/Throat:     Mouth: Mucous membranes are moist.  Cardiovascular:     Rate and Rhythm: Normal rate and regular rhythm.     Heart sounds: Normal heart sounds. No murmur heard. No friction rub.  Pulmonary:     Effort: Pulmonary effort is normal. No respiratory distress.     Breath sounds: Examination of the right-middle field reveals rales. Examination of the left-middle field reveals rales. Examination of the right-lower field reveals rales. Examination of the left-lower field reveals rales. Rales present. No wheezing.  Musculoskeletal:     Right lower leg: 1+ Pitting Edema present.     Left lower leg: 1+ Pitting Edema present.  Neurological:     Mental Status: She is alert and oriented to person, place, and time.  Psychiatric:        Behavior: Behavior normal.     Assessment/Plan  1. Cough I am concerned based on lung exam that she has either fluid overload or unresolved pneumonia.  We will check chest x-ray consider further evaluation pending this result.  I am going to hold off additional treatment until we have some of these results so as not to overwhelm her system with antibiotic she may not need. - DG Chest 2 View; Future  2. History of COVID-19 Hospitalized just over a month ago.  Slow recovery and continues with fatigue and shortness of breath.  I have ordered oxygen for her to use at bedtime.  She did test positive for sleep apnea on sleep study, but has been unable to get a CPAP machine.  I do feel she would benefit from some oxygen to use at night since she does have some issues with waking up short of breath.  Although she is extremely short of breath with sitting in the office and certainly with walking, her  O2 sat remained above 92% when walking. - For home use only DME oxygen - DG Chest 2 View; Future  3. Shortness of breath See above.  We will check some blood work to make sure no other contributors to shortness of breath.  I suspect when we treat either infection or fluid overload that breathing will improve. - For home use only DME oxygen - Ferritin; Future - Brain natriuretic peptide; Future - DG Chest 2 View; Future - Brain natriuretic peptide - Ferritin  4. Proteins serum plasma low She is eating regularly, but protein levels were quite low during hospital stay.  I would like to make sure these are improving since this can also  contribute to some of her lower extremity edema, fatigue, and poor recovery. - Phosphorus; Future - Prealbumin; Future - Prealbumin - Phosphorus  5. Sleep apnea, unspecified type Due to national shortage of CPAP machines, I feel it is medically necessary for her to have home oxygen to wear at night.  As she recovers from pneumonia, I think this will be helpful to aid in her breathing overall and with maintaining oxygen saturations. - For home use only DME oxygen  6. Recurrent UTI Recheck today due to frequency.  7. Urinary frequency - Urinalysis with Culture Reflex; Future - Urinalysis with Culture Reflex  8. Leukocytosis, unspecified type - CBC with Differential/Platelet; Future - CBC with Differential/Platelet  9. Essential hypertension Blood pressure on the low end of normal today encouraged her to continue to check at home - Comprehensive metabolic panel; Future - Comprehensive metabolic panel   Return for pending cxr; if able set up in 3 weeks; ok to use acute slot.    Micheline Rough, MD

## 2020-05-05 ENCOUNTER — Other Ambulatory Visit: Payer: Self-pay | Admitting: Family Medicine

## 2020-05-05 MED ORDER — LEVOFLOXACIN 750 MG PO TABS: 750.0000 mg | ORAL_TABLET | Freq: Every day | ORAL | 0 refills | Status: AC

## 2020-05-06 ENCOUNTER — Telehealth: Payer: Self-pay | Admitting: Family Medicine

## 2020-05-06 NOTE — Telephone Encounter (Signed)
Selena [name] is calling and stated that they received an order for night time oxygen and needed more documentation, please advise. CB is [phone]

## 2020-05-06 NOTE — Telephone Encounter (Signed)
Spoke with Lenna Sciara and she stated the pts insurance will not cover noctural oxygen with diagnosis of OSA.  Stated notes do not indicate any trouble breathing at rest with saturation levels and they can provide continuous oxygen if the pt wants to private pay for this for a period of time?  Message sent to PCP.

## 2020-05-06 NOTE — Telephone Encounter (Signed)
Please see what documentation they need and how we can get it to them.

## 2020-05-07 ENCOUNTER — Telehealth: Payer: Self-pay | Admitting: Family Medicine

## 2020-05-07 LAB — URINALYSIS W MICROSCOPIC + REFLEX CULTURE
Bilirubin Urine: NEGATIVE
Glucose, UA: NEGATIVE
Hyaline Cast: NONE SEEN /LPF
Ketones, ur: NEGATIVE
Nitrites, Initial: POSITIVE — AB
Specific Gravity, Urine: 1.011 (ref 1.001–1.03)
Squamous Epithelial / HPF: NONE SEEN /HPF (ref <=5)
WBC, UA: >=60 /HPF — AB (ref 0–5)
pH: 6 (ref 5.0–8.0)

## 2020-05-07 LAB — URINE CULTURE
MICRO NUMBER:: 11756873
SPECIMEN QUALITY:: ADEQUATE

## 2020-05-07 LAB — CULTURE INDICATED

## 2020-05-07 LAB — PREALBUMIN: Prealbumin: 12 mg/dL — ABNORMAL LOW (ref 17–34)

## 2020-05-07 NOTE — Telephone Encounter (Signed)
Selena [name] from [facility] call and stated pt want to hold off on this wk physical therapy because she is not feeling well.

## 2020-05-07 NOTE — Telephone Encounter (Signed)
Sharyn Lull from [facility] is calling to let Dr. Ethlyn Gallery know that the patient is feeling too ill today for home health OT and they are moving the appointment to next week.  Melene Plan [phone]

## 2020-05-07 NOTE — Telephone Encounter (Signed)
If she feels any worse than she did in the office; or if she notes that she is not getting some improvement with her symptoms in the 48-72 hours after starting antibiotics; she will need to be seen, even if this means going back to the hospital. I will unfortunately be out of the office next week, so I want to make sure that she is aware to get seen if not improving. Please see other note; I have reached out to pulmonology regarding O2.

## 2020-05-07 NOTE — Telephone Encounter (Signed)
Please advise them to call pulmonology. She has seen them before and they were involved with her sleep evaluation.  I am hopeful that they will be able to help with this. I will also send them a message.   Please also check in with her and see how she is feeling today? I want her to be feeling better each day. If any worsening of symptoms she needs to be seen.

## 2020-05-07 NOTE — Telephone Encounter (Signed)
Left a detailed message on Selena [name]'s voicemail with the information below per PCP.  Also spoke with the pt and informed her of the message from [place].  Patient stated she is feeling a lot better today, is currently at [facility] with her son and agreed to call back if needed.

## 2020-05-14 ENCOUNTER — Ambulatory Visit (INDEPENDENT_AMBULATORY_CARE_PROVIDER_SITE_OTHER): Payer: Medicare Other | Admitting: Nurse Practitioner

## 2020-05-14 ENCOUNTER — Telehealth: Payer: Self-pay | Admitting: *Deleted

## 2020-05-14 VITALS — BP 134/60 | HR 64 | Temp 97.9°F | Resp 18

## 2020-05-14 DIAGNOSIS — G4734 Idiopathic sleep related nonobstructive alveolar hypoventilation: Secondary | ICD-10-CM | POA: Insufficient documentation

## 2020-05-14 DIAGNOSIS — Z8616 Personal history of COVID-19: Secondary | ICD-10-CM | POA: Diagnosis not present

## 2020-05-14 DIAGNOSIS — J1282 Pneumonia due to coronavirus disease 2019: Secondary | ICD-10-CM | POA: Diagnosis not present

## 2020-05-14 DIAGNOSIS — U071 COVID-19: Secondary | ICD-10-CM | POA: Diagnosis not present

## 2020-05-14 HISTORY — DX: Idiopathic sleep related nonobstructive alveolar hypoventilation: G47.34

## 2020-05-14 NOTE — Progress Notes (Signed)
@Patient  ID: Selena [name], female    DOB: [birth year], 85 y.o.   MRN: [id number]  Chief Complaint  Patient presents with  . Follow-up    Referring provider: Caren Macadam, MD     HPI  Patient presents today for post-COVID care clinic visit follow-up.  Patient's PCP requested that she be seen in our office to be evaluated for nighttime oxygen.  We discussed with patient that she will need a referral to pulmonary for this.  I will place the referral during this visit today.  Patient is slowly recovering from COVID and pneumonia.  She was walked to the office today and O2 sats remained at 95% during walk and heart rate was stable.  Patient is currently participating in home physical therapy.  Patient states that she has been checking her O2 sats at night and her sats are usually around 91% when checked.  She is concerned that her oxygen level is dropping lower while sleeping.  She has had a recent sleep study and is awaiting her CPAP machine. Denies f/c/s, n/v/d, hemoptysis, PND, chest pain or edema.     Allergies  Allergen Reactions  . Codeine Phosphate Other (See Comments)    Hallucinations  . Adhesive [Tape]     Electrodes for EKG- skin blistering, erythema  . Codeine Other (See Comments)    Hallucinations     Immunization History  Administered Date(s) Administered  . Influenza Split 10/20/2011  . Influenza Whole 11/07/2006  . Influenza, High Dose Seasonal PF 10/13/2016, 10/12/2017  . Influenza-Unspecified 09/07/2013, 10/15/2015, 09/26/2018, 10/01/2019  . Moderna Sars-Covid-2 Vaccination 02/04/2019, 03/04/2019, 12/24/2019  . Pneumococcal Conjugate-13 08/26/2013  . Pneumococcal Polysaccharide-23 10/29/2007, 08/28/2017  . Td 05/27/2008  . Tdap 05/30/2018  . Zoster 10/29/2007    Past Medical History:  Diagnosis Date  . Anemia    PMH  . Aneurysm (arteriovenous) of coronary vessels   . Atrial fibrillation ([place]) 01/01/2007  . BACK PAIN, UPPER 07/09/2008  . Breast  cancer, stage 1 ([place])   . Bruises easily   . Cataract    history  . Complication of anesthesia   . Diverticulitis   . DIVERTICULOSIS, COLON 12/26/2006  . Essential hypertension 10/27/2017  . Follicular non-Hodgkin's lymphoma ([place])   . Goiter    multi-nodular  . Hx of colonoscopy 2009  . LIVER FUNCTION TESTS, ABNORMAL, HX OF 12/26/2007  . Memory loss 05/16/2007  . OSTEOARTHRITIS 12/26/2006   takes Diclofenac daily but has stopped for surgery  . PONV (postoperative nausea and vomiting)   . Shortness of breath 05/28/2019  . Skin cancer   . VERTIGO 09/07/2009  . Wears dentures   . Wears glasses   . Wears hearing aid    bilateral    Tobacco History: Social History   Tobacco Use  Smoking Status Former Smoker  . Packs/day: 0.10  . Years: 20.00  . Pack years: 2.00  . Types: Cigarettes  . Quit date: 01/24/1978  . Years since quitting: 42.3  Smokeless Tobacco Never Used   Counseling given: Yes   Outpatient Encounter Medications as of 05/14/2020  Medication Sig  . albuterol (VENTOLIN HFA) 108 (90 Base) MCG/ACT inhaler Inhale 2 puffs into the lungs every 6 (six) hours as needed for wheezing or shortness of breath.  [name] amLODipine (NORVASC) 5 MG tablet TAKE 1 TABLET BY MOUTH DAILY (Patient taking differently: Take 5 mg by mouth daily.)  . ELIQUIS 5 MG TABS tablet TAKE 1 TABLET(5 MG) BY MOUTH TWICE DAILY  . furosemide (  LASIX) 20 MG tablet Take 1 tablet (20 mg total) by mouth daily.  . metoprolol tartrate (LOPRESSOR) 50 MG tablet Take 1 tablet (50 mg total) by mouth 3 (three) times daily. MAY TAKE ONE EXTRA TABLET FOR HEART RATE OVER 100.  . montelukast (SINGULAIR) 10 MG tablet TAKE 1 TABLET(10 MG) BY MOUTH AT BEDTIME (Patient taking differently: Take 10 mg by mouth at bedtime.)  . Multiple Vitamins-Minerals (PRESERVISION AREDS 2 PO) Take 2 capsules by mouth daily.  . Multiple Vitamins-Minerals (ZINC PO) Take 1 tablet by mouth every evening.  . pantoprazole (PROTONIX) 40 MG tablet TAKE 1  TABLET(40 MG) BY MOUTH TWICE DAILY (Patient taking differently: Take 40 mg by mouth 2 (two) times daily.)  . Spacer/Aero-Holding Chambers DEVI 1 Device by Does not apply route daily as needed (for use with inhaler).  . traZODone (DESYREL) 50 MG tablet Take 1 tablet (50 mg total) by mouth at bedtime as needed for up to 5 days for sleep.   No facility-administered encounter medications on file as of 05/14/2020.     Review of Systems  Review of Systems  Constitutional: Positive for fatigue. Negative for fever.  HENT: Negative.   Respiratory: Positive for cough and shortness of breath.   Cardiovascular: Negative.  Negative for chest pain, palpitations and leg swelling.  Gastrointestinal: Negative.   Allergic/Immunologic: Negative.   Neurological: Positive for weakness.  Psychiatric/Behavioral: Negative.        Physical Exam  BP 134/60   Pulse 64   Temp 97.9 F (36.6 C)   Resp 18   SpO2 95% Comment: RA  Wt Readings from Last 5 Encounters:  05/04/20 185 lb 9.6 oz (84.2 kg)  04/06/20 191 lb 2.2 oz (86.7 kg)  03/26/20 191 lb (86.6 kg)  03/10/20 186 lb 8.2 oz (84.6 kg)  02/19/20 198 lb 3.2 oz (89.9 kg)     Physical Exam Vitals and nursing note reviewed.  Constitutional:      General: She is not in acute distress.    Appearance: She is well-developed.  Cardiovascular:     Rate and Rhythm: Normal rate. Rhythm irregular.     Comments: Chronic a-fib Pulmonary:     Effort: Pulmonary effort is normal.     Breath sounds: Normal breath sounds.  Musculoskeletal:     Right lower leg: No edema.     Left lower leg: No edema.  Neurological:     Mental Status: She is alert and oriented to person, place, and time.  Psychiatric:        Mood and Affect: Mood normal.        Behavior: Behavior normal.      Imaging: DG Chest 2 View  Result Date: 05/05/2020 CLINICAL DATA:  85 year old female with a history of pneumonia and shortness of breath EXAM: CHEST - 2 VIEW COMPARISON:   04/02/2020 FINDINGS: Cardiomediastinal silhouette unchanged in size and contour. Increased reticulonodular opacities of the bilateral lungs. No pneumothorax. No pleural effusion. Cardiac event recorder on the anterior chest wall. Degenerative changes without acute displaced fracture IMPRESSION: Reticulonodular opacities throughout the lungs, compatible with atypical infection. Electronically Signed   By: Corrie Mckusick D.O.   On: 05/05/2020 08:52     Assessment & Plan:   Pneumonia due to COVID-19 virus Nocturnal desaturations Dyspnea with exertion:   Stay well hydrated  Stay active  Deep breathing exercises  May take tylenol or fever or pain  Will place referral to pulmonary - May need ONO  Complete entire course of  antibiotics prescribed by PCP   Follow up:  Follow up if needed       Fenton Foy, NP 05/14/2020

## 2020-05-14 NOTE — Telephone Encounter (Signed)
Spoke with the pt and informed her of the message below.  Patient stated she just left the Covid care clinic today and the nurse practitioner informed her their office will be contacting pulmonary and arranging testing.  Message sent to PCP.

## 2020-05-14 NOTE — Telephone Encounter (Signed)
-----   Message from Caren Macadam, MD sent at 05/12/2020  8:37 AM EDT ----- Please check in on patient - let her know about message below regarding getting oxygen from pulmonology (in case she hadn't spoken with them yet). I do think bed elevation is good while recovering from pneumonia as well. Hopefully at this point she is feeling a little better overall. If not please triage. I would be ok with putting her in a same day slot next week if she would like for a follow up. ----- Message ----- From: Laurin Coder, MD Sent: 05/07/2020   7:33 PM EDT To: Caren Macadam, MD  An overnight oximetry can be ordered to qualify for oxygen-unfortunately, has to have a qualifying study 5 minutes of desaturations required or else it will not be paid for.  Sleeping with the head of the bed elevated may help-about 30 degrees or more  Jaynee Eagles  ----- Message ----- From: Caren Macadam, MD Sent: 05/07/2020   2:40 PM EDT To: Laurin Coder, MD, Fenton Foy, NP  Can you guys help with this? I saw Ms. Minchew this week and she has pneumonia and is very short of breath. She was recently hospitalized with this as well and didn't qualify for oxygen due to maintaining her O2 sats. Patient and family was asking about getting night time oxygen to aid with breathing through night since she has not yet received her cpap for OSA recently diagnosed, but the company we tried to get oxygen from said they won't supply for osa and can't get her oxygen due to sat only dropping to 92% on room air. I have started treatment with levaquin and am hoping that breathing improves but wondering if you could help with oxygen in meanwhile (or might have other tips for her). I have sent a message back to patient/family to contact your office. Appreciate your help!

## 2020-05-14 NOTE — Assessment & Plan Note (Signed)
Nocturnal desaturations Dyspnea with exertion:   Stay well hydrated  Stay active  Deep breathing exercises  May take tylenol or fever or pain  Will place referral to pulmonary - May need ONO  Complete entire course of antibiotics prescribed by PCP   Follow up:  Follow up if needed

## 2020-05-14 NOTE — Patient Instructions (Signed)
Covid pneumonia Nocturnal desaturations Dyspnea with exertion:   Stay well hydrated  Stay active  Deep breathing exercises  May take tylenol or fever or pain  Will place referral to pulmonary - May need ONO  Complete entire course of antibiotics prescribed by PCP   Follow up:  Follow up if needed

## 2020-05-18 NOTE — Telephone Encounter (Signed)
She is getting stronger and breathing a little better, but still tired, weak feeling. Better than when she was in the office. Not coughing much.   Trying to get a hold of company for CPAP and not getting through on line. Looks like number she was calling was off one from [facility]; gave her diff number to try [phone] instead of [phone] that she was trying) - online address was same as that which she had. Told her she should get stronger each day; call if not continuing to improve or if set backs. Let me know if she can't get in touch with someone re cpap.

## 2020-06-03 ENCOUNTER — Telehealth: Payer: Self-pay | Admitting: Family Medicine

## 2020-06-03 NOTE — Telephone Encounter (Signed)
Mariann Laster w/[employer] is calling in stating that they have faxed over a order form on 05/01/2020 and have not received it back and now it is out of medicare compliance.  Mariann Laster would like to see if it can be faxed back today and they also sent another one today just in case that we have not received the other ones.

## 2020-06-03 NOTE — Telephone Encounter (Signed)
I reviewed the chart and did not see a form from 4/8.  Left a detailed message with [employer] Dy for Mariann Laster stating we do not have a form from 4/8, to please have the form faxed to our office and the provider will review.

## 2020-06-04 ENCOUNTER — Encounter: Payer: Self-pay | Admitting: Pulmonary Disease

## 2020-06-04 ENCOUNTER — Ambulatory Visit: Payer: Medicare Other | Admitting: Pulmonary Disease

## 2020-06-04 ENCOUNTER — Other Ambulatory Visit: Payer: Self-pay

## 2020-06-04 VITALS — BP 108/64 | HR 78 | Temp 97.6°F | Ht 66.0 in | Wt 182.4 lb

## 2020-06-04 DIAGNOSIS — G4733 Obstructive sleep apnea (adult) (pediatric): Secondary | ICD-10-CM

## 2020-06-04 DIAGNOSIS — Z9989 Dependence on other enabling machines and devices: Secondary | ICD-10-CM | POA: Diagnosis not present

## 2020-06-04 MED ORDER — DAYVIGO 5 MG PO TABS: 5.0000 mg | ORAL_TABLET | Freq: Every evening | ORAL | 1 refills | Status: DC

## 2020-06-04 NOTE — Progress Notes (Signed)
Selena [name]    [id number]    10-22-1935  Primary Care Physician:Koberlein, Steele Berg, MD  Referring Physician: Caren Macadam, MD [place],  [place] [address]  Chief complaint:   Patient being seen for elevated blood count Found to have obstructive sleep apnea and started on CPAP therapy  HPI:  She has been trying to get used to CPAP, has only utilized CPAP for about 2 weeks She has significant difficulty falling asleep, she states this is happening on a nightly basis especially with CPAP in place She is struggling to get used to with  She is willing to put it on every night if she can have something that helps her fall asleep She currently uses melatonin, this is not helping She has tried up to 20 mg of melatonin without any effect Takes hours to fall asleep or not fall asleep at all until she takes CPAP off Has not tried any other sleep aids in the past  Has had 2 recent hospitalization 1 for pneumonia, UTI  Has been more short of breath recently, has been exacerbation of the vertigo  Wakes up with a dry mouth No morning headaches No night sweats No significant family history of sleep apnea that she can remember  She is not usually very sleepy during the day  She is tired during the day from multiple medical problems, does not take naps   Outpatient Encounter Medications as of 06/04/2020  Medication Sig  . albuterol (VENTOLIN HFA) 108 (90 Base) MCG/ACT inhaler Inhale 2 puffs into the lungs every 6 (six) hours as needed for wheezing or shortness of breath.  [name] amLODipine (NORVASC) 5 MG tablet TAKE 1 TABLET BY MOUTH DAILY (Patient taking differently: Take 5 mg by mouth daily.)  . ELIQUIS 5 MG TABS tablet TAKE 1 TABLET(5 MG) BY MOUTH TWICE DAILY  . furosemide (LASIX) 20 MG tablet Take 1 tablet (20 mg total) by mouth daily.  . montelukast (SINGULAIR) 10 MG tablet TAKE 1 TABLET(10 MG) BY MOUTH AT BEDTIME (Patient taking differently:  Take 10 mg by mouth at bedtime.)  . Multiple Vitamins-Minerals (PRESERVISION AREDS 2 PO) Take 2 capsules by mouth daily.  . Multiple Vitamins-Minerals (ZINC PO) Take 1 tablet by mouth every evening.  . pantoprazole (PROTONIX) 40 MG tablet TAKE 1 TABLET(40 MG) BY MOUTH TWICE DAILY (Patient taking differently: Take 40 mg by mouth 2 (two) times daily.)  . Spacer/Aero-Holding Chambers DEVI 1 Device by Does not apply route daily as needed (for use with inhaler).  . metoprolol tartrate (LOPRESSOR) 50 MG tablet Take 1 tablet (50 mg total) by mouth 3 (three) times daily. MAY TAKE ONE EXTRA TABLET FOR HEART RATE OVER 100.  . traZODone (DESYREL) 50 MG tablet Take 1 tablet (50 mg total) by mouth at bedtime as needed for up to 5 days for sleep.   No facility-administered encounter medications on file as of 06/04/2020.    Allergies as of 06/04/2020 - Review Complete 06/04/2020  Allergen Reaction Noted  . Codeine phosphate Other (See Comments)   . Adhesive [tape]  02/14/2018  . Codeine Other (See Comments) 03/23/2011    Past Medical History:  Diagnosis Date  . Anemia    PMH  . Aneurysm (arteriovenous) of coronary vessels   . Atrial fibrillation ([place]) 01/01/2007  . BACK PAIN, UPPER 07/09/2008  . Breast cancer, stage 1 ([place])   . Bruises easily   . Cataract    history  .  Complication of anesthesia   . Diverticulitis   . DIVERTICULOSIS, COLON 12/26/2006  . Essential hypertension 10/27/2017  . Follicular non-Hodgkin's lymphoma ([place])   . Goiter    multi-nodular  . Hx of colonoscopy 2009  . LIVER FUNCTION TESTS, ABNORMAL, HX OF 12/26/2007  . Memory loss 05/16/2007  . OSTEOARTHRITIS 12/26/2006   takes Diclofenac daily but has stopped for surgery  . PONV (postoperative nausea and vomiting)   . Shortness of breath 05/28/2019  . Skin cancer   . VERTIGO 09/07/2009  . Wears dentures   . Wears glasses   . Wears hearing aid    bilateral    Past Surgical History:  Procedure Laterality Date  . ABDOMINAL  HYSTERECTOMY    . ABLATION OF DYSRHYTHMIC FOCUS  2009  . APPENDECTOMY    . AUGMENTATION MAMMAPLASTY Right 2013  . BIOPSY  05/07/2019   Procedure: BIOPSY;  Surgeon: Rush Landmark Telford Nab., MD;  Location: [facility];  Service: Gastroenterology;;  . BREAST BIOPSY  2013  . BREAST RECONSTRUCTION  04/14/2011   Procedure: BREAST RECONSTRUCTION;  Surgeon: Crissie Reese, MD;  Location: [facility];  Service: Plastics;  Laterality: Right;  Placement of Right Breast Tissue Expander with  use of Flex HD for Breast Reconstruction  . BREAST SURGERY     right sm, snbx  . CATARACT EXTRACTION W/ INTRAOCULAR LENS  IMPLANT, BILATERAL  2010   bilateral  . COLONOSCOPY    . COLONOSCOPY    . ESOPHAGOGASTRODUODENOSCOPY (EGD) WITH PROPOFOL N/A 05/07/2019   Procedure: ESOPHAGOGASTRODUODENOSCOPY (EGD) WITH PROPOFOL;  Surgeon: Rush Landmark Telford Nab., MD;  Location: [facility];  Service: Gastroenterology;  Laterality: N/A;  . KNEE SURGERY  2004/2010   arthroscopic/ bilateral knee replacements  . LOOP RECORDER IMPLANT    . MASTECTOMY Right 2013  . PORT-A-CATH REMOVAL N/A 12/07/2016   Procedure: REMOVAL PORT-A-CATH;  Surgeon: Rolm Bookbinder, MD;  Location: [facility];  Service: General;  Laterality: N/A;  . PORTACATH PLACEMENT Right 08/11/2014   Procedure: INSERTION PORT-A-CATH WITH ULTRASOUND;  Surgeon: Rolm Bookbinder, MD;  Location: [facility];  Service: General;  Laterality: Right;  . [place]   right  . tmi  1998  . TONSILLECTOMY      Family History  Problem Relation Age of Onset  . Colon cancer Mother   . Cancer Mother        colon  . Colon cancer Maternal Aunt   . Cancer Maternal Uncle   . Prostate cancer Maternal Uncle   . Prostate cancer Maternal Uncle   . Other Maternal Uncle   . Cancer Sister        kidney  . Cancer Brother        lymph node  . Anesthesia problems Neg Hx   . Hypotension Neg Hx   . Malignant hyperthermia Neg Hx   . Pseudochol deficiency Neg Hx   . Breast cancer  Neg Hx     Social History   Socioeconomic History  . Marital status: Widowed    Spouse name: Not on file  . Number of children: Not on file  . Years of education: Not on file  . Highest education level: Not on file  Occupational History  . Occupation: retired  Tobacco Use  . Smoking status: Former Smoker    Packs/day: 0.10    Years: 20.00    Pack years: 2.00    Types: Cigarettes    Quit date: 01/24/1978    Years since quitting: 42.3  . Smokeless  tobacco: Never Used  Vaping Use  . Vaping Use: Never used  Substance and Sexual Activity  . Alcohol use: No  . Drug use: No  . Sexual activity: Yes    Birth control/protection: Surgical  Other Topics Concern  . Not on file  Social History Narrative  . Not on file   Social Determinants of Health   Financial Resource Strain: Low Risk   . Difficulty of Paying Living Expenses: Not hard at all  Food Insecurity: No Food Insecurity  . Worried About Charity fundraiser in the Last Year: Never true  . Ran Out of Food in the Last Year: Never true  Transportation Needs: No Transportation Needs  . Lack of Transportation (Medical): No  . Lack of Transportation (Non-Medical): No  Physical Activity: Insufficiently Active  . Days of Exercise per Week: 7 days  . Minutes of Exercise per Session: 10 min  Stress: No Stress Concern Present  . Feeling of Stress : Not at all  Social Connections: Moderately Isolated  . Frequency of Communication with Friends and Family: More than three times a week  . Frequency of Social Gatherings with Friends and Family: Never  . Attends Religious Services: More than 4 times per year  . Active Member of Clubs or Organizations: No  . Attends Archivist Meetings: Never  . Marital Status: Widowed  Intimate Partner Violence: Not At Risk  . Fear of Current or Ex-Partner: No  . Emotionally Abused: No  . Physically Abused: No  . Sexually Abused: No    Review of Systems  Constitutional: Positive for  fatigue.  Respiratory: Positive for apnea and shortness of breath.   Psychiatric/Behavioral: Positive for sleep disturbance.    Vitals:   06/04/20 1206  BP: 108/64  Pulse: 78  Temp: 97.6 F (36.4 C)  SpO2: 95%     Physical Exam Constitutional:      Appearance: She is obese.  HENT:     Head: Normocephalic and atraumatic.     Nose: No congestion or rhinorrhea.     Mouth/Throat:     Mouth: Mucous membranes are moist.  Eyes:     General:        Right eye: No discharge.        Left eye: No discharge.  Cardiovascular:     Rate and Rhythm: Normal rate and regular rhythm.     Heart sounds: No murmur heard. No friction rub.  Pulmonary:     Effort: No respiratory distress.     Breath sounds: No stridor. No wheezing or rhonchi.  Musculoskeletal:     Cervical back: No rigidity or tenderness.  Neurological:     Mental Status: She is alert.  Psychiatric:        Mood and Affect: Mood normal.    Data Reviewed: Diastolic dysfunction noted on echo from 09/07/2017  CT chest from 05/08/2019 reviewed by myself showing no infiltrative process in the lungs  Hematocrit of 45.4 recently  PFT from 2019 revealed decreased diffusing capacity  Patient has mild obstructive sleep apnea but was significant daytime sleepiness -Initiated on CPAP  Assessment:  Snoring  Mild obstructive sleep apnea on CPAP therapy -Auto titrating CPAP 5-15 -Only started CPAP about 2 weeks ago -We will try and get a download from her the next time she comes in  -Having difficulty getting used to CPAP therapy  Daytime fatigue  Shortness of breath on exertion -This is multifactorial  Vertigo which is limiting her activities significantly  Plan/Recommendations: I will see her back in about 4 weeks  Encouraged to bring her machine in the next time she comes in  I was going to start her on Lunesta, Jarold Motto is what is covered for her-prescribed low-dose Dayvigo 5 mg nightly Encouraged her to give [place] a  call if we need to escalate doses  Encouraged to continue to try to get used to using CPAP on a nightly basis   Sherrilyn Rist MD [facility] 06/04/2020, 12:09 PM  CC: Caren Macadam, MD

## 2020-06-04 NOTE — Patient Instructions (Signed)
I will see you in about 4 weeks  Make sure you bring your machine in so that we can get you registered in the system and be able to see what the machine is doing for your next visit  I did send in a prescription for a sleep aid  Let us know if this is not working and we can go up on the dose  Call us with any significant concerns   Continue trying to get used to using a CPAP

## 2020-07-09 ENCOUNTER — Ambulatory Visit: Payer: Medicare Other | Admitting: Pulmonary Disease

## 2020-07-09 ENCOUNTER — Other Ambulatory Visit: Payer: Self-pay

## 2020-07-09 ENCOUNTER — Encounter: Payer: Self-pay | Admitting: Pulmonary Disease

## 2020-07-09 VITALS — BP 118/74 | HR 69 | Temp 97.4°F | Ht 66.0 in | Wt 181.2 lb

## 2020-07-09 DIAGNOSIS — G4733 Obstructive sleep apnea (adult) (pediatric): Secondary | ICD-10-CM

## 2020-07-09 DIAGNOSIS — Z9989 Dependence on other enabling machines and devices: Secondary | ICD-10-CM

## 2020-07-09 MED ORDER — DAYVIGO 5 MG PO TABS: 10.0000 mg | ORAL_TABLET | Freq: Every day | ORAL | 1 refills | Status: DC

## 2020-07-09 NOTE — Patient Instructions (Signed)
Continue using the CPAP on a nightly basis  Did increase your sleep medication from 5 mg to 10 mg  Give [place] a call in about 2 to 4 weeks if you feel it still not helping then we may consider changing to another medication  Call with any other significant concerns

## 2020-07-09 NOTE — Progress Notes (Signed)
Selena [name]    [id number]    04/21/1935  Primary Care Physician:Koberlein, Steele Berg, MD  Referring Physician: Caren Macadam, [place],  [place] [address]  Chief complaint:   Patient being seen for elevated blood count Found to have obstructive sleep apnea and started on CPAP therapy In for follow-up today, has been using CPAP on a regular basis  HPI:  Continues to try to use CPAP on a nightly basis She remembers to put it on every night sometimes cannot use it through the night Has difficulty sleeping on most nights but with medications she is able to at least get some sleep  Currently using Dayvigo at 5 mg  She has significant difficulty falling asleep, she states this is happening on a nightly basis especially with CPAP in place She is struggling to get used to with  20 mg melatonin did not help with sleep issues  Wakes up with a dry mouth No morning headaches No night sweats No significant family history of sleep apnea that she can remember  She is not usually very sleepy during the day  She is tired during the day from multiple medical problems, does not take naps  Has no significant difficulty with CPAP but sometimes not able to leave it on through the night Sleep aid does help but sometimes still not able to sleep through the night   Outpatient Encounter Medications as of 07/09/2020  Medication Sig   albuterol (VENTOLIN HFA) 108 (90 Base) MCG/ACT inhaler Inhale 2 puffs into the lungs every 6 (six) hours as needed for wheezing or shortness of breath.   amLODipine (NORVASC) 5 MG tablet TAKE 1 TABLET BY MOUTH DAILY (Patient taking differently: Take 5 mg by mouth daily.)   ELIQUIS 5 MG TABS tablet TAKE 1 TABLET(5 MG) BY MOUTH TWICE DAILY   furosemide (LASIX) 20 MG tablet Take 1 tablet (20 mg total) by mouth daily.   Metoprolol-Hydrochlorothiazide 50-12.5 MG TB24 Take by mouth 3 (three) times daily.   montelukast  (SINGULAIR) 10 MG tablet TAKE 1 TABLET(10 MG) BY MOUTH AT BEDTIME (Patient taking differently: Take 10 mg by mouth at bedtime.)   Multiple Vitamins-Minerals (PRESERVISION AREDS 2 PO) Take 2 capsules by mouth daily.   Multiple Vitamins-Minerals (ZINC PO) Take 1 tablet by mouth every evening.   pantoprazole (PROTONIX) 40 MG tablet TAKE 1 TABLET(40 MG) BY MOUTH TWICE DAILY (Patient taking differently: Take 40 mg by mouth 2 (two) times daily.)   Spacer/Aero-Holding Chambers DEVI 1 Device by Does not apply route daily as needed (for use with inhaler).   [DISCONTINUED] Lemborexant (DAYVIGO) 5 MG TABS Take 5 mg by mouth at bedtime.   [DISCONTINUED] Lemborexant (DAYVIGO) 5 MG TABS Take 5 mg by mouth.   Lemborexant (DAYVIGO) 5 MG TABS Take 10 mg by mouth daily.   [DISCONTINUED] metoprolol tartrate (LOPRESSOR) 50 MG tablet Take 1 tablet (50 mg total) by mouth 3 (three) times daily. MAY TAKE ONE EXTRA TABLET FOR HEART RATE OVER 100.   [DISCONTINUED] traZODone (DESYREL) 50 MG tablet Take 1 tablet (50 mg total) by mouth at bedtime as needed for up to 5 days for sleep.   No facility-administered encounter medications on file as of 07/09/2020.    Allergies as of 07/09/2020 - Review Complete 07/09/2020  Allergen Reaction Noted   Codeine phosphate Other (See Comments)    Adhesive [tape]  02/14/2018   Codeine Other (See Comments) 03/23/2011  Past Medical History:  Diagnosis Date   Anemia    PMH   Aneurysm (arteriovenous) of coronary vessels    Atrial fibrillation ([place]) 01/01/2007   BACK PAIN, UPPER 07/09/2008   Breast cancer, stage 1 ([place])    Bruises easily    Cataract    history   Complication of anesthesia    Diverticulitis    DIVERTICULOSIS, COLON 12/26/2006   Essential hypertension 18/08/4164   Follicular non-Hodgkin's lymphoma ([place])    Goiter    multi-nodular   Hx of colonoscopy 2009   LIVER FUNCTION TESTS, ABNORMAL, HX OF 12/26/2007   Memory loss 05/16/2007   OSTEOARTHRITIS 12/26/2006    takes Diclofenac daily but has stopped for surgery   PONV (postoperative nausea and vomiting)    Shortness of breath 05/28/2019   Skin cancer    VERTIGO 09/07/2009   Wears dentures    Wears glasses    Wears hearing aid    bilateral    Past Surgical History:  Procedure Laterality Date   ABDOMINAL HYSTERECTOMY     ABLATION OF DYSRHYTHMIC FOCUS  2009   APPENDECTOMY     AUGMENTATION MAMMAPLASTY Right 2013   BIOPSY  05/07/2019   Procedure: BIOPSY;  Surgeon: Irving Copas., MD;  Location: [facility];  Service: Gastroenterology;;   BREAST BIOPSY  2013   BREAST RECONSTRUCTION  04/14/2011   Procedure: BREAST RECONSTRUCTION;  Surgeon: Crissie Reese, MD;  Location: [facility];  Service: Plastics;  Laterality: Right;  Placement of Right Breast Tissue Expander with  use of Flex HD for Breast Reconstruction   BREAST SURGERY     right sm, snbx   CATARACT EXTRACTION W/ INTRAOCULAR LENS  IMPLANT, BILATERAL  2010   bilateral   COLONOSCOPY     COLONOSCOPY     ESOPHAGOGASTRODUODENOSCOPY (EGD) WITH PROPOFOL N/A 05/07/2019   Procedure: ESOPHAGOGASTRODUODENOSCOPY (EGD) WITH PROPOFOL;  Surgeon: Irving Copas., MD;  Location: [facility];  Service: Gastroenterology;  Laterality: N/A;   KNEE SURGERY  2004/2010   arthroscopic/ bilateral knee replacements   LOOP RECORDER IMPLANT     MASTECTOMY Right 2013   PORT-A-CATH REMOVAL N/A 12/07/2016   Procedure: REMOVAL PORT-A-CATH;  Surgeon: Rolm Bookbinder, MD;  Location: [facility];  Service: General;  Laterality: N/A;   PORTACATH PLACEMENT Right 08/11/2014   Procedure: INSERTION PORT-A-CATH WITH ULTRASOUND;  Surgeon: Rolm Bookbinder, MD;  Location: [facility];  Service: General;  Laterality: Right;   ROTATOR CUFF REPAIR  1999   right   tmi  1998   TONSILLECTOMY      Family History  Problem Relation Age of Onset   Colon cancer Mother    Cancer Mother        colon   Colon cancer Maternal Aunt    Cancer Maternal Uncle    Prostate cancer Maternal  Uncle    Prostate cancer Maternal Uncle    Other Maternal Uncle    Cancer Sister        kidney   Cancer Brother        lymph node   Anesthesia problems Neg Hx    Hypotension Neg Hx    Malignant hyperthermia Neg Hx    Pseudochol deficiency Neg Hx    Breast cancer Neg Hx     Social History   Socioeconomic History   Marital status: Widowed    Spouse name: Not on file   Number of children: Not on file   Years of education: Not on file   Highest education level: Not  on file  Occupational History   Occupation: retired  Tobacco Use   Smoking status: Former    Packs/day: 0.10    Years: 20.00    Pack years: 2.00    Types: Cigarettes    Quit date: 01/24/1978    Years since quitting: 42.4   Smokeless tobacco: Never  Vaping Use   Vaping Use: Never used  Substance and Sexual Activity   Alcohol use: No   Drug use: No   Sexual activity: Yes    Birth control/protection: Surgical  Other Topics Concern   Not on file  Social History Narrative   Not on file   Social Determinants of Health   Financial Resource Strain: Low Risk    Difficulty of Paying Living Expenses: Not hard at all  Food Insecurity: No Food Insecurity   Worried About Charity fundraiser in the Last Year: Never true   [place] in the Last Year: Never true  Transportation Needs: No Transportation Needs   Lack of Transportation (Medical): No   Lack of Transportation (Non-Medical): No  Physical Activity: Insufficiently Active   Days of Exercise per Week: 7 days   Minutes of Exercise per Session: 10 min  Stress: No Stress Concern Present   Feeling of Stress : Not at all  Social Connections: Moderately Isolated   Frequency of Communication with Friends and Family: More than three times a week   Frequency of Social Gatherings with Friends and Family: Never   Attends Religious Services: More than 4 times per year   Active Member of [employer] or Organizations: No   Attends Archivist Meetings: Never    Marital Status: Widowed  Human resources officer Violence: Not At Risk   Fear of Current or Ex-Partner: No   Emotionally Abused: No   Physically Abused: No   Sexually Abused: No    Review of Systems  Constitutional:  Positive for fatigue.  Respiratory:  Positive for apnea and shortness of breath.   Psychiatric/Behavioral:  Positive for sleep disturbance.    Vitals:   07/09/20 1147  BP: 118/74  Pulse: 69  Temp: (!) 97.4 F (36.3 C)  SpO2: 97%     Physical Exam Constitutional:      Appearance: She is obese.  HENT:     Head: Normocephalic and atraumatic.     Nose: No congestion or rhinorrhea.     Mouth/Throat:     Mouth: Mucous membranes are moist.  Eyes:     General:        Right eye: No discharge.        Left eye: No discharge.  Cardiovascular:     Rate and Rhythm: Normal rate and regular rhythm.     Heart sounds: No murmur heard.   No friction rub.  Pulmonary:     Effort: No respiratory distress.     Breath sounds: No stridor. No wheezing or rhonchi.  Musculoskeletal:     Cervical back: No rigidity or tenderness.  Neurological:     Mental Status: She is alert.  Psychiatric:        Mood and Affect: Mood normal.   Data Reviewed: Diastolic dysfunction noted on echo from 09/07/2017  CT chest from 05/08/2019 reviewed by myself showing no infiltrative process in the lungs  Hematocrit of 45.4 recently  PFT from 2019 revealed decreased diffusing capacity  Patient has mild obstructive sleep apnea but was significant daytime sleepiness -Initiated on CPAP   CPAP compliance noted  Assessment:  Mild obstructive sleep apnea on CPAP therapy -Auto titrating CPAP 5-15 -We will try and get a download from her the next time she comes in  -Having difficulty getting used to CPAP therapy -Lives around every night but not able to sleep through the night on most days  Daytime fatigue  Shortness of breath on exertion -This is multifactorial  Vertigo which is limiting her  activities significantly  Plan/Recommendations: Follow-up in 3 months  Encouraged to continue using CPAP on a nightly basis  Will increase dose of Dayvigo from 5-10  Encouraged to call with any significant concerns   Sherrilyn Rist MD [facility] 07/09/2020, 12:34 PM  CC: Caren Macadam, MD

## 2020-07-13 ENCOUNTER — Other Ambulatory Visit: Payer: Self-pay | Admitting: Nurse Practitioner

## 2020-07-13 ENCOUNTER — Other Ambulatory Visit: Payer: Self-pay | Admitting: Pulmonary Disease

## 2020-07-13 ENCOUNTER — Other Ambulatory Visit: Payer: Self-pay | Admitting: *Deleted

## 2020-07-13 ENCOUNTER — Telehealth: Payer: Self-pay | Admitting: Pulmonary Disease

## 2020-07-13 MED ORDER — DAYVIGO 10 MG PO TABS: 10.0000 mg | ORAL_TABLET | Freq: Every evening | ORAL | 3 refills | Status: DC

## 2020-07-13 MED ORDER — FUROSEMIDE 20 MG PO TABS: 20.0000 mg | ORAL_TABLET | Freq: Every day | ORAL | 0 refills | Status: DC

## 2020-07-13 NOTE — Telephone Encounter (Signed)
Medication sent into pharmacy  

## 2020-07-13 NOTE — Telephone Encounter (Signed)
Spoke with pharmacy. Pt's insurance will not cover Dayvigo 5mg  take 2 tabs daily. They will cover the 10mg  tablet take once daily.  Pharmacy will need a new rx entered for the 10mg . Please advise.  (Will not need to call pharmacy back once new rx has been entered. Message can be closed)

## 2020-07-13 NOTE — Progress Notes (Unsigned)
Okay with prescription for Dayvigo 10 mg nightly

## 2020-07-16 ENCOUNTER — Telehealth: Payer: Self-pay | Admitting: Pulmonary Disease

## 2020-07-23 NOTE — Telephone Encounter (Signed)
Nothing noted in message. Will close encounter.  

## 2020-08-12 ENCOUNTER — Other Ambulatory Visit: Payer: Self-pay | Admitting: Family Medicine

## 2020-08-12 ENCOUNTER — Other Ambulatory Visit: Payer: Self-pay | Admitting: *Deleted

## 2020-08-12 ENCOUNTER — Other Ambulatory Visit: Payer: Self-pay | Admitting: Cardiovascular Disease

## 2020-08-12 DIAGNOSIS — I10 Essential (primary) hypertension: Secondary | ICD-10-CM

## 2020-08-18 ENCOUNTER — Other Ambulatory Visit: Payer: Self-pay

## 2020-08-19 ENCOUNTER — Ambulatory Visit: Payer: Medicare Other | Admitting: Family Medicine

## 2020-08-19 ENCOUNTER — Encounter: Payer: Self-pay | Admitting: Family Medicine

## 2020-08-19 VITALS — BP 100/50 | HR 65 | Temp 97.8°F | Ht 66.0 in | Wt 186.1 lb

## 2020-08-19 DIAGNOSIS — N170 Acute kidney failure with tubular necrosis: Secondary | ICD-10-CM

## 2020-08-19 DIAGNOSIS — R6520 Severe sepsis without septic shock: Secondary | ICD-10-CM

## 2020-08-19 DIAGNOSIS — D72829 Elevated white blood cell count, unspecified: Secondary | ICD-10-CM | POA: Diagnosis not present

## 2020-08-19 DIAGNOSIS — G4730 Sleep apnea, unspecified: Secondary | ICD-10-CM | POA: Diagnosis not present

## 2020-08-19 DIAGNOSIS — J310 Chronic rhinitis: Secondary | ICD-10-CM

## 2020-08-19 DIAGNOSIS — L659 Nonscarring hair loss, unspecified: Secondary | ICD-10-CM | POA: Diagnosis not present

## 2020-08-19 DIAGNOSIS — I10 Essential (primary) hypertension: Secondary | ICD-10-CM

## 2020-08-19 DIAGNOSIS — R0989 Other specified symptoms and signs involving the circulatory and respiratory systems: Secondary | ICD-10-CM

## 2020-08-19 DIAGNOSIS — A4151 Sepsis due to Escherichia coli [E. coli]: Secondary | ICD-10-CM

## 2020-08-19 LAB — CBC WITH DIFFERENTIAL/PLATELET
Basophils Absolute: 0.1 10*3/uL (ref 0.0–0.1)
Basophils Relative: 1 % (ref 0.0–3.0)
Eosinophils Absolute: 0.3 10*3/uL (ref 0.0–0.7)
Eosinophils Relative: 3.3 % (ref 0.0–5.0)
HCT: 44.9 % (ref 36.0–46.0)
Hemoglobin: 14.8 g/dL (ref 12.0–15.0)
Lymphocytes Relative: 20.1 % (ref 12.0–46.0)
Lymphs Abs: 1.7 10*3/uL (ref 0.7–4.0)
MCHC: 33 g/dL (ref 30.0–36.0)
MCV: 93 fl (ref 78.0–100.0)
Monocytes Absolute: 0.8 10*3/uL (ref 0.1–1.0)
Monocytes Relative: 9.7 % (ref 3.0–12.0)
Neutro Abs: 5.4 10*3/uL (ref 1.4–7.7)
Neutrophils Relative %: 65.9 % (ref 43.0–77.0)
Platelets: 185 10*3/uL (ref 150.0–400.0)
RBC: 4.83 Mil/uL (ref 3.87–5.11)
RDW: 15.3 % (ref 11.5–15.5)
WBC: 8.2 10*3/uL (ref 4.0–10.5)

## 2020-08-19 LAB — COMPREHENSIVE METABOLIC PANEL
ALT: 16 U/L (ref 0–35)
AST: 18 U/L (ref 0–37)
Albumin: 4.2 g/dL (ref 3.5–5.2)
Alkaline Phosphatase: 125 U/L — ABNORMAL HIGH (ref 39–117)
BUN: 21 mg/dL (ref 6–23)
CO2: 29 mEq/L (ref 19–32)
Calcium: 9.2 mg/dL (ref 8.4–10.5)
Chloride: 104 mEq/L (ref 96–112)
Creatinine, Ser: 0.79 mg/dL (ref 0.40–1.20)
GFR: 69.45 mL/min (ref >60.00)
Glucose, Bld: 80 mg/dL (ref 70–99)
Potassium: 4 mEq/L (ref 3.5–5.1)
Sodium: 145 mEq/L (ref 135–145)
Total Bilirubin: 0.7 mg/dL (ref 0.2–1.2)
Total Protein: 5.8 g/dL — ABNORMAL LOW (ref 6.0–8.3)

## 2020-08-19 LAB — TSH: TSH: 0.56 u[IU]/mL (ref 0.35–5.50)

## 2020-08-19 LAB — BRAIN NATRIURETIC PEPTIDE: Pro B Natriuretic peptide (BNP): 244 pg/mL — ABNORMAL HIGH (ref 0.0–100.0)

## 2020-08-19 LAB — VITAMIN D 25 HYDROXY (VIT D DEFICIENCY, FRACTURES): VITD: 35.96 ng/mL (ref 30.00–100.00)

## 2020-08-19 NOTE — Patient Instructions (Addendum)
*  try citrucel instead of metamucil and let me know how you do with this.   *check with grandaughter - any other options for dripping nose? You have tried antihistamines, atrovent nasal spray, flonase nasal spray without help.

## 2020-08-19 NOTE — Progress Notes (Signed)
Selena [name] DOB: [birth year] Encounter date: 08/19/2020  This is a 85 y.o. female who presents with Chief Complaint  Patient presents with   Follow-up    History of present illness: Last visit with me was 05/04/20. She was tired, still short of breath after dx of pneumonia at that time. Cxr showed worsening pneumonia, levaquin was sent in for her. Wbc at that time elevated.  She has been seeing Dr. Ander Slade for management of sleep apnea. She states that some nights she does pretty well with this; other nights she wakes up, has to urinate, and then can't get machine back on. She states that sleeping pills help when she first goes to bed.   Hair is coming out - not sure why. No change in shampoo.   Biggest complaint is runny nose. Right before she eats it runs. Has been running since she fell years ago.   Going blind; nothing she can do. Following with eye doctor.   Knot on right ankle. Has been there for awhile. Not tender; just wondering if she needs to see dermatology.   She is taking fluid pills, wearing stockings and staying swollen.   Seems like every time she has to urinate she has bowel movement. Hemorrhoid not flared. She is taking metamucil. Sometimes stools are hard as brick; other times "gushes" out. No abdominal pain. Usually harder in morning; then around lunch more runny. Diet pretty well rounded. No issues with acid reflux, heartburn. Takes the metamucil in the morning. Good bout drinking water - a few bottles in a day. No blood in stools.     Allergies  Allergen Reactions   Codeine Phosphate Other (See Comments)    Hallucinations   Adhesive [Tape]     Electrodes for EKG- skin blistering, erythema   Codeine Other (See Comments)    Hallucinations    Current Meds  Medication Sig   amLODipine (NORVASC) 5 MG tablet TAKE 1 TABLET BY MOUTH DAILY   ELIQUIS 5 MG TABS tablet TAKE 1 TABLET(5 MG) BY MOUTH TWICE DAILY   furosemide (LASIX) 20 MG tablet Take 1 tablet (20  mg total) by mouth daily. Please schedule appointment for future refills. Thank you   Lemborexant (DAYVIGO) 10 MG TABS Take 10 mg by mouth at bedtime.   Metoprolol-Hydrochlorothiazide 50-12.5 MG TB24 Take by mouth 3 (three) times daily.   montelukast (SINGULAIR) 10 MG tablet TAKE 1 TABLET(10 MG) BY MOUTH AT BEDTIME   Multiple Vitamins-Minerals (PRESERVISION AREDS 2 PO) Take 2 capsules by mouth daily.   pantoprazole (PROTONIX) 40 MG tablet TAKE 1 TABLET(40 MG) BY MOUTH TWICE DAILY    Review of Systems  Constitutional:  Negative for chills, fatigue and fever.  HENT:  Positive for rhinorrhea.   Respiratory:  Negative for cough, chest tightness, shortness of breath and wheezing.   Cardiovascular:  Negative for chest pain, palpitations and leg swelling.  Gastrointestinal:  Negative for abdominal pain, constipation and diarrhea (bowels are frequent but not diarrhea).   Objective:  BP (!) 100/50 (BP Location: Left Arm, Patient Position: Sitting, Cuff Size: Large)   Pulse 65   Temp 97.8 F (36.6 C) (Oral)   Ht '5\' 6"'$  (1.676 m)   Wt 186 lb 1.6 oz (84.4 kg)   SpO2 96%   BMI 30.04 kg/m   Weight: 186 lb 1.6 oz (84.4 kg)   BP Readings from Last 3 Encounters:  08/19/20 (!) 100/50  07/09/20 118/74  06/04/20 108/64   Wt Readings from Last 3 Encounters:  08/19/20 186 lb 1.6 oz (84.4 kg)  07/09/20 181 lb 3.2 oz (82.2 kg)  06/04/20 182 lb 6.4 oz (82.7 kg)    Physical Exam Constitutional:      General: She is not in acute distress.    Appearance: She is well-developed.  HENT:     Nose:     Right Turbinates: Enlarged, swollen and pale.     Left Turbinates: Enlarged, swollen and pale.  Cardiovascular:     Rate and Rhythm: Normal rate and regular rhythm.     Heart sounds: Normal heart sounds. No murmur heard.   No friction rub.  Pulmonary:     Effort: Pulmonary effort is normal. No respiratory distress.     Breath sounds: Examination of the right-lower field reveals rales. Examination of  the left-lower field reveals rales. Rales (minimal) present. No wheezing.  Musculoskeletal:     Right lower leg: No edema.     Left lower leg: No edema.  Neurological:     Mental Status: She is alert and oriented to person, place, and time.  Psychiatric:        Behavior: Behavior normal.    Assessment/Plan  1. Sleep apnea, unspecified type She is working with pulmonology; still with hard time adjusting to mask.   2. Essential hypertension Well controlled. On low end today. Check blood pressures at home. Continue with current medications for now, but would consider decrease if number regularly less than 120/65. - CBC with Differential/Platelet; Future - Comprehensive metabolic panel; Future - CBC with Differential/Platelet - Comprehensive metabolic panel  3. Leukocytosis, unspecified type Will recheck labs today.  4. Hair loss We discussed that this could be related to being very sick 3 mo ago with covid/pneumonia. Will also check some bloodwork, but suspect this will stabilize.  - TSH; Future - VITAMIN D 25 Hydroxy (Vit-D Deficiency, Fractures); Future - TSH - VITAMIN D 25 Hydroxy (Vit-D Deficiency, Fractures)  5. Rales - Brain natriuretic peptide; Future - Brain natriuretic peptide  6. Rhinitis, unspecified type - Ambulatory referral to ENT    Return in about 6 months (around 02/19/2021).     Micheline Rough, MD

## 2020-08-25 ENCOUNTER — Telehealth: Payer: Self-pay

## 2020-08-25 NOTE — Telephone Encounter (Signed)
See results note. 

## 2020-08-27 NOTE — Telephone Encounter (Signed)
Patient informed-see results note. 

## 2020-08-27 NOTE — Telephone Encounter (Signed)
PT called to obtain the results from labs that the nurse had called about

## 2020-08-28 ENCOUNTER — Ambulatory Visit: Payer: Medicare Other | Admitting: Internal Medicine

## 2020-08-28 NOTE — Progress Notes (Deleted)
Name: Selena [name]  MRN/ DOB: [id number], 1936-01-06    Age/ Sex: 85 y.o., female     PCP: Caren Macadam, MD   Reason for Endocrinology Evaluation: Multinodular goiter     Initial Endocrinology Clinic Visit: 08/26/2019    PATIENT IDENTIFIER: Selena [name] is a 85 y.o., female with a past medical history of  A. Fib, HFpEF, Hx of lymphoma and GERD. She has followed with [facility] since 08/26/2019 for consultative assistance with management of her MNG.   HISTORICAL SUMMARY:    Pt has been diagnosed with MNG in 2007 when she was noted to have an incidental finding of MNG, this prompted an ultrasound with a repeat in 2020 showing none of the nodules met criteria for FNA at the time.     Mother with thyroid disease SUBJECTIVE:   Today (08/28/2020):  Selena [name] is here for follow-up on multinodular goiter.      HISTORY:  Past Medical History:  Past Medical History:  Diagnosis Date   Anemia    PMH   Aneurysm (arteriovenous) of coronary vessels    Atrial fibrillation ([place]) 01/01/2007   BACK PAIN, UPPER 07/09/2008   Breast cancer, stage 1 ([place])    Bruises easily    Cataract    history   Complication of anesthesia    Diverticulitis    DIVERTICULOSIS, COLON 12/26/2006   Essential hypertension 01/01/1187   Follicular non-Hodgkin's lymphoma ([place])    Goiter    multi-nodular   Hx of colonoscopy 2009   LIVER FUNCTION TESTS, ABNORMAL, HX OF 12/26/2007   Memory loss 05/16/2007   OSTEOARTHRITIS 12/26/2006   takes Diclofenac daily but has stopped for surgery   PONV (postoperative nausea and vomiting)    Shortness of breath 05/28/2019   Skin cancer    VERTIGO 09/07/2009   Wears dentures    Wears glasses    Wears hearing aid    bilateral   Past Surgical History:  Past Surgical History:  Procedure Laterality Date   ABDOMINAL HYSTERECTOMY     ABLATION OF DYSRHYTHMIC FOCUS  2009   APPENDECTOMY     AUGMENTATION MAMMAPLASTY Right 2013   BIOPSY   05/07/2019   Procedure: BIOPSY;  Surgeon: Irving Copas., MD;  Location: [facility];  Service: Gastroenterology;;   BREAST BIOPSY  2013   BREAST RECONSTRUCTION  04/14/2011   Procedure: BREAST RECONSTRUCTION;  Surgeon: Crissie Reese, MD;  Location: [facility];  Service: Plastics;  Laterality: Right;  Placement of Right Breast Tissue Expander with  use of Flex HD for Breast Reconstruction   BREAST SURGERY     right sm, snbx   CATARACT EXTRACTION W/ INTRAOCULAR LENS  IMPLANT, BILATERAL  2010   bilateral   COLONOSCOPY     COLONOSCOPY     ESOPHAGOGASTRODUODENOSCOPY (EGD) WITH PROPOFOL N/A 05/07/2019   Procedure: ESOPHAGOGASTRODUODENOSCOPY (EGD) WITH PROPOFOL;  Surgeon: Irving Copas., MD;  Location: [facility];  Service: Gastroenterology;  Laterality: N/A;   KNEE SURGERY  2004/2010   arthroscopic/ bilateral knee replacements   LOOP RECORDER IMPLANT     MASTECTOMY Right 2013   PORT-A-CATH REMOVAL N/A 12/07/2016   Procedure: REMOVAL PORT-A-CATH;  Surgeon: Rolm Bookbinder, MD;  Location: [facility];  Service: General;  Laterality: N/A;   PORTACATH PLACEMENT Right 08/11/2014   Procedure: INSERTION PORT-A-CATH WITH ULTRASOUND;  Surgeon: Rolm Bookbinder, MD;  Location: [facility];  Service: General;  Laterality: Right;   [place]   right  tmi  1998   TONSILLECTOMY     Social History:  reports that she quit smoking about 42 years ago. Her smoking use included cigarettes. She has a 2.00 pack-year smoking history. She has never used smokeless tobacco. She reports that she does not drink alcohol and does not use drugs. Family History:  Family History  Problem Relation Age of Onset   Colon cancer Mother    Cancer Mother        colon   Colon cancer Maternal Aunt    Cancer Maternal Uncle    Prostate cancer Maternal Uncle    Prostate cancer Maternal Uncle    Other Maternal Uncle    Cancer Sister        kidney   Cancer Brother        lymph node   Anesthesia problems Neg  Hx    Hypotension Neg Hx    Malignant hyperthermia Neg Hx    Pseudochol deficiency Neg Hx    Breast cancer Neg Hx      HOME MEDICATIONS: Allergies as of 08/28/2020       Reactions   Codeine Phosphate Other (See Comments)   Hallucinations   Adhesive [tape]    Electrodes for EKG- skin blistering, erythema   Codeine Other (See Comments)   Hallucinations         Medication List        Accurate as of August 28, 2020 12:54 PM. If you have any questions, ask your nurse or doctor.          amLODipine 5 MG tablet Commonly known as: NORVASC TAKE 1 TABLET BY MOUTH DAILY   DayVigo 10 MG Tabs Generic drug: Lemborexant Take 10 mg by mouth at bedtime.   Eliquis 5 MG Tabs tablet Generic drug: apixaban TAKE 1 TABLET(5 MG) BY MOUTH TWICE DAILY   furosemide 20 MG tablet Commonly known as: LASIX Take 1 tablet (20 mg total) by mouth daily. Please schedule appointment for future refills. Thank you   Metoprolol-Hydrochlorothiazide 50-12.5 MG Tb24 Take by mouth 3 (three) times daily.   montelukast 10 MG tablet Commonly known as: SINGULAIR TAKE 1 TABLET(10 MG) BY MOUTH AT BEDTIME   pantoprazole 40 MG tablet Commonly known as: PROTONIX TAKE 1 TABLET(40 MG) BY MOUTH TWICE DAILY   PRESERVISION AREDS 2 PO Take 2 capsules by mouth daily.          OBJECTIVE:   PHYSICAL EXAM: VS: There were no vitals taken for this visit.   EXAM: General: Pt appears well and is in NAD  Hydration: Well-hydrated with moist mucous membranes and good skin turgor  Eyes: External eye exam normal without stare, lid lag or exophthalmos.  EOM intact.  PERRL.  Ears, Nose, Throat: Hearing: Grossly intact bilaterally Dental: Good dentition  Throat: Clear without mass, erythema or exudate  Neck: General: Supple without adenopathy. Thyroid: Thyroid size normal.  No goiter or nodules appreciated. No thyroid bruit.  Lungs: Clear with good BS bilat with no rales, rhonchi, or wheezes  Heart:  Auscultation: RRR.  Abdomen: Normoactive bowel sounds, soft, nontender, without masses or organomegaly palpable  Extremities: Gait and station: Normal gait  Digits and nails: No clubbing, cyanosis, petechiae, or nodes Head and neck: Normal alignment and mobility BL UE: Normal ROM and strength. BL LE: No pretibial edema normal ROM and strength.  Skin: Hair: Texture and amount normal with gender appropriate distribution Skin Inspection: No rashes, acanthosis nigricans/skin tags. No lipohypertrophy Skin Palpation: Skin temperature, texture, and thickness  normal to palpation  Neuro: Cranial nerves: II - XII grossly intact  Cerebellar: Normal coordination and movement; no tremor Motor: Normal strength throughout DTRs: 2+ and symmetric in UE without delay in relaxation phase  Mental Status: Judgment, insight: Intact Orientation: Oriented to time, place, and person Memory: Intact for recent and remote events Mood and affect: No depression, anxiety, or agitation     DATA REVIEWED:   Results for LIA, VIGILANTE (MRN [id number]) as of 08/28/2020 12:57  Ref. Range 08/19/2020 14:57  Glucose Latest Ref Range: 70 - 99 mg/dL 80  TSH Latest Ref Range: 0.35 - 5.50 uIU/mL 0.56     Thyroid ultrasound 10/01/2019 Nodule 1 in the isthmus is a mixed cystic and solid nodule that measures 1.6 x 0.9 x 1.4 cm and previously measured 1.3 x 0.8 x 1.1 cm. Solid component is slightly hypoechoic. This is compatible with a TR 3 nodule. *Given size (>/= 1.5 - 2.4 cm) and appearance, a follow-up ultrasound in 1 year should be considered based on TI-RADS criteria.   Nodule 2 along the right side of the isthmus is likely solid. Echogenicity is uncertain. This nodule measures 1.6 x 0.8 x 1.4 cm and previously measured 1.3 x 0.7 x 1.4 cm. This is likely a TR 3 nodule. *Given size (>/= 1.5 - 2.4 cm) and appearance, a follow-up ultrasound in 1 year should be considered based on TI-RADS criteria.   Nodule 3 in the  right superior thyroid lobe is isoechoic and measures 1.5 x 1.1 x 1.4 cm and previously measured 1.8 x 1.0 x 1.3 cm. This is a TR 3 nodule. *Given size (>/= 1.5 - 2.4 cm) and appearance, a follow-up ultrasound in 1 year should be considered based on TI-RADS criteria.   Nodule 4 in the right mid thyroid lobe is an isoechoic solid nodule that measures 1.2 x 1.0 x 1.2 cm and previously measured 1.0 x 1.0 x 1.0 cm. This is a TR 3 nodule. Given size (<1.4 cm) and appearance, this nodule does NOT meet TI-RADS criteria for biopsy or dedicated follow-up.   Nodule 5 in the right mid thyroid lobe is hypoechoic and measures 0.8 x 0.7 x 0.9 cm and previously measured 0.9 x 0.9 x 0.8 cm. This is a TR 4 nodule. Given size (<0.9 cm) and appearance, this nodule does NOT meet TI-RADS criteria for biopsy or dedicated follow-up.   Nodule 7 in the right inferior thyroid lobe is an isoechoic solid nodule that measures 1.6 x 1.3 x 1.5 cm and previously measured 1.8 x 1.5 x 2.2 cm. This is a TR 3 nodule. *Given size (>/= 1.5 - 2.4 cm) and appearance, a follow-up ultrasound in 1 year should be considered based on TI-RADS criteria.   Left thyroid lobe is diffusely heterogeneous without a discrete nodule.   IMPRESSION: 1. Multinodular goiter. 2. Multiple thyroid nodules are compatible with TR 3 nodules and meet criteria for annual follow-up until there is at least 5 years of stability. 3. No new suspicious thyroid nodules.   ASSESSMENT / PLAN / RECOMMENDATIONS:   Multinodular goiter:     ***   Signed electronically by: Mack Guise, MD  [facility]  [facility] [address] [place], [place] [address] Phone: [phone] FAX: [phone]      CC: Caren Macadam, [place] [address] Phone: [phone]  Fax: [phone]   Return to Endocrinology clinic as below: Future Appointments  Date Time Provider  [place]  08/28/2020  2:00 PM Sanayah Munro, Melanie Crazier, MD [facility] None  09/17/2020  7:40 AM [facility] DEVICE REMOTES [facility]  10/19/2020  7:40 AM [facility] DEVICE REMOTES [facility]  11/11/2020  1:45 PM [facility] HEALTH ADVISOR 2 [facility]  11/19/2020  7:40 AM [facility] DEVICE REMOTES [facility]  12/21/2020  7:40 AM [facility] DEVICE REMOTES [facility]  01/21/2021  7:40 AM [facility] DEVICE REMOTES [facility]

## 2020-09-11 ENCOUNTER — Other Ambulatory Visit: Payer: Self-pay | Admitting: Cardiovascular Disease

## 2020-09-11 DIAGNOSIS — I10 Essential (primary) hypertension: Secondary | ICD-10-CM

## 2020-09-14 ENCOUNTER — Other Ambulatory Visit: Payer: Self-pay | Admitting: Family Medicine

## 2020-09-14 DIAGNOSIS — Z1231 Encounter for screening mammogram for malignant neoplasm of breast: Secondary | ICD-10-CM

## 2020-09-29 ENCOUNTER — Other Ambulatory Visit: Payer: Self-pay | Admitting: Cardiovascular Disease

## 2020-10-03 ENCOUNTER — Other Ambulatory Visit: Payer: Self-pay | Admitting: Physician Assistant

## 2020-10-05 ENCOUNTER — Ambulatory Visit: Payer: Medicare Other | Admitting: Internal Medicine

## 2020-10-05 ENCOUNTER — Telehealth: Payer: Self-pay | Admitting: Pulmonary Disease

## 2020-10-05 NOTE — Telephone Encounter (Signed)
Med that AO last put her on 07/13/20 was Jarold Motto which is a sleeping pill.  Attempted to call pt but unable to reach. Left message for her to return call.

## 2020-10-06 ENCOUNTER — Other Ambulatory Visit: Payer: Self-pay | Admitting: Otolaryngology

## 2020-10-06 ENCOUNTER — Telehealth: Payer: Self-pay | Admitting: Cardiovascular Disease

## 2020-10-06 ENCOUNTER — Encounter: Payer: Self-pay | Admitting: *Deleted

## 2020-10-06 DIAGNOSIS — J329 Chronic sinusitis, unspecified: Secondary | ICD-10-CM

## 2020-10-06 MED ORDER — METOPROLOL TARTRATE 25 MG PO TABS: 25.0000 mg | ORAL_TABLET | Freq: Two times a day (BID) | ORAL | 1 refills | Status: DC

## 2020-10-06 NOTE — Telephone Encounter (Signed)
   Pt c/o medication issue:  1. Name of Medication: Metoprolol-Hydrochlorothiazide 50-12.5 MG TB24  2. How are you currently taking this medication (dosage and times per day)? Take by mouth 3 (three) times daily.  3. Are you having a reaction (difficulty breathing--STAT)?   4. What is your medication issue? Pt requesting to speak with nurse regarding this medications, she said she is not sure who needs to refill this meds, if her heart doctor of lung doctor

## 2020-10-06 NOTE — Telephone Encounter (Signed)
Spoke with the pt  She states that her cardiologist told her it looks like we changed her metoprolol It looks like med was added on as a historical medication at her last ov  She states she is taking this med  She will call cards for any further questions about the metoprolol since we do not prescribe this for her Nothing further needed

## 2020-10-06 NOTE — Telephone Encounter (Signed)
Called patient and she is on Metoprolol 25 mg BID. Patient's past prescriptions show this too. Patient stated at one time she was taking an extra pill for palpitations, but she has not had to do that for over 6 months. Sent in Metoprolol 25 mg BID 90 day supply with 1 refill to get her through until her appointment with Dr. Angelena Form. Patient has never taken metoprolol-Hydrochlorothiazide 50/12.5 mg. Called patient's pharmacy and they stated this medication does not exist. Removed medication from patient's list and refilled what patient has been taking in the past.

## 2020-10-07 ENCOUNTER — Other Ambulatory Visit: Payer: Self-pay | Admitting: *Deleted

## 2020-10-07 ENCOUNTER — Other Ambulatory Visit: Payer: Self-pay | Admitting: Nurse Practitioner

## 2020-10-07 ENCOUNTER — Other Ambulatory Visit: Payer: Self-pay | Admitting: Internal Medicine

## 2020-10-07 NOTE — Telephone Encounter (Signed)
Patients pantoprazole has been refilled and updated. Spoke with patient patient is aware. Patient has also been scheduled with Jerelyn Scott for follow up visit (has not been seen in 1 yr) Oct 11 at 10:30am. Pt verbalized understanding and nothing further needed at this time.

## 2020-10-07 NOTE — Telephone Encounter (Signed)
Pt last saw Dr Lovena Le 11/29/19, last labs 08/19/20 Creat 0.79, age 85, weight 84.4 kg, based on specified criteria pt is on appropriate dosage of Eliquis '5mg'$  BID for afib.  We will refill rx.

## 2020-10-19 ENCOUNTER — Other Ambulatory Visit: Payer: Self-pay

## 2020-10-19 ENCOUNTER — Ambulatory Visit
Admission: RE | Admit: 2020-10-19 | Discharge: 2020-10-19 | Disposition: A | Discharge status: Home / Self Care | Payer: Medicare Other | Source: Ambulatory Visit | Attending: Family Medicine | Admitting: Family Medicine

## 2020-10-19 DIAGNOSIS — Z1231 Encounter for screening mammogram for malignant neoplasm of breast: Secondary | ICD-10-CM

## 2020-10-20 ENCOUNTER — Ambulatory Visit
Admission: RE | Admit: 2020-10-20 | Discharge: 2020-10-20 | Disposition: A | Discharge status: Home / Self Care | Payer: Medicare Other | Source: Ambulatory Visit | Attending: Otolaryngology | Admitting: Otolaryngology

## 2020-10-20 DIAGNOSIS — J329 Chronic sinusitis, unspecified: Secondary | ICD-10-CM

## 2020-11-03 ENCOUNTER — Ambulatory Visit: Payer: Medicare Other | Admitting: Nurse Practitioner

## 2020-11-03 ENCOUNTER — Other Ambulatory Visit (INDEPENDENT_AMBULATORY_CARE_PROVIDER_SITE_OTHER): Payer: Medicare Other

## 2020-11-03 ENCOUNTER — Other Ambulatory Visit: Payer: Self-pay | Admitting: Nurse Practitioner

## 2020-11-03 ENCOUNTER — Encounter: Payer: Self-pay | Admitting: Nurse Practitioner

## 2020-11-03 VITALS — BP 110/60 | HR 61 | Ht 66.0 in | Wt 186.0 lb

## 2020-11-03 DIAGNOSIS — R112 Nausea with vomiting, unspecified: Secondary | ICD-10-CM | POA: Diagnosis not present

## 2020-11-03 DIAGNOSIS — K219 Gastro-esophageal reflux disease without esophagitis: Secondary | ICD-10-CM

## 2020-11-03 LAB — CBC
HCT: 44.1 % (ref 36.0–46.0)
Hemoglobin: 14.3 g/dL (ref 12.0–15.0)
MCHC: 32.5 g/dL (ref 30.0–36.0)
MCV: 95.8 fl (ref 78.0–100.0)
Platelets: 187 10*3/uL (ref 150.0–400.0)
RBC: 4.6 Mil/uL (ref 3.87–5.11)
RDW: 15 % (ref 11.5–15.5)
WBC: 9.2 10*3/uL (ref 4.0–10.5)

## 2020-11-03 LAB — COMPREHENSIVE METABOLIC PANEL
ALT: 15 U/L (ref 0–35)
AST: 19 U/L (ref 0–37)
Albumin: 4.3 g/dL (ref 3.5–5.2)
Alkaline Phosphatase: 100 U/L (ref 39–117)
BUN: 16 mg/dL (ref 6–23)
CO2: 31 mEq/L (ref 19–32)
Calcium: 9.4 mg/dL (ref 8.4–10.5)
Chloride: 104 mEq/L (ref 96–112)
Creatinine, Ser: 0.68 mg/dL (ref 0.40–1.20)
GFR: 80.75 mL/min (ref >60.00)
Glucose, Bld: 81 mg/dL (ref 70–99)
Potassium: 4 mEq/L (ref 3.5–5.1)
Sodium: 142 mEq/L (ref 135–145)
Total Bilirubin: 0.7 mg/dL (ref 0.2–1.2)
Total Protein: 6.3 g/dL (ref 6.0–8.3)

## 2020-11-03 MED ORDER — FAMOTIDINE 20 MG PO TABS: 20.0000 mg | ORAL_TABLET | Freq: Every day | ORAL | 1 refills | Status: DC

## 2020-11-03 NOTE — Patient Instructions (Addendum)
LABS:  Lab work has been ordered for you today. Our lab is located in the basement. Press "B" on the elevator. The lab is located at the first door on the left as you exit the elevator.  HEALTHCARE LAWS AND MY CHART RESULTS: Due to recent changes in healthcare laws, you may see the results of your imaging and laboratory studies on MyChart before your provider has had a chance to review them.   We understand that in some cases there may be results that are confusing or concerning to you. Not all laboratory results come back in the same time frame and the provider may be waiting for multiple results in order to interpret others.  Please give [place] 48 hours in order for your provider to thoroughly review all the results before contacting the office for clarification of your results.   RECOMMENDATIONS: Continue Pantoprazole 40 MG twice a day. Add Famotidine 20 MG at bedtime. This has been sent to your pharmacy. Miralax- Dissolve one capful in 8 ounces of water and drink before bed as needed to help avoid straining.  Contact us in 1 week to give an update on your symptoms.  It was great seeing you today! Thank you for entrusting me with your care and choosing [facility].  Noralyn Pick, CRNP  The [facility] providers would like to encourage you to use [facility] to communicate with providers for non-urgent requests or questions.  Due to long hold times on the telephone, sending your provider a message by [facility] may be faster and more efficient way to get a response. Please allow 48 business hours for a response.  Please remember that this is for non-urgent requests/questions. If you are age 33 or older, your body mass index should be between 23-30. Your Body mass index is 30.02 kg/m. If this is out of the aforementioned range listed, please consider follow up with your Primary Care Provider.  If you are age 19 or younger, your body mass index should be between 19-25. Your Body mass  index is 30.02 kg/m. If this is out of the aformentioned range listed, please consider follow up with your Primary Care Provider.

## 2020-11-03 NOTE — Progress Notes (Signed)
11/04/2020 Selena [name] [id number] [birth year]   Chief Complaint: hot water acid reflux, vomiting, no nausea  History of Present Illness: Selena [name] is a 85 y.o. female with a past medical history of hypertension, CHF, atrial fibrillation on Eliquis, thoracic aneurysm, breast cancer stage I s/p right mastectomy 8657, follicular non-Hodgkin's lymphoma and upper GI bleed. She presents to our office today with complaints of having "hot water" acid like secretions which radiates from her stomach into her mouth which results in vomiting nonbloody nonbilious emesis while sitting on the toilet passing a BM which occurred twice last week and the 3rd episode occurred on Sunday 11/01/2020. She describes feeling as if her stomach does a flip flop when she is passing a BM without any noticeable straining. She does not fel near faint during these episodes, no dizziness, CP, palpitations or SOB. No nausea. She is passing a hard to softly formed light brown BM once or twice daily. No black stools. She is taking Citrucel fiber supplement daily. She is worried that she will have recurrence of vomiting blood. As previously reviewed, she was admitted to the hospital with an upper GI with hematemesis and melena on Eliquis and Meloxicam. She underwent an EGD by Dr. Rush Landmark on 05/07/2019 which showed questionable extrinsic compression with mild mucosal abnormality noted in the mid esophagus at 22cm,  biopsies showed benign squamous mucosa. A widely patent and non-obstructing Schatzki ring. A 3 cm hiatal hernia. Non-bleeding gastric ulcer with a small flat spot (Forrest Class IIc) in the fundus. As well as many non-bleeding gastric ulcers and erosions with a clean ulcer base (Forrest Class III). Erythematous mucosa in the stomach. Gastric biopsies showed chronic active gastritis. She was treated with Pantoprazole 40mg  po bid. She underwent a repeat EGD on 08/19/2020 which showed a scar in the gastric fundus  without further evidence of an ulcer or gastric erosions. She was advised to continue PPI bid x 4 more weeks then transition to PPI QD and to follow up in the outpatient GI clinic in 3 to 4 months which was not done. She remains on Pantoprazole 40mg  po bid. Hg 14.8 on 08/19/2020.  EGD 08/20/2019: - No gross lesions in esophagus. - Z-line irregular. - 3 cm hiatal hernia. - Scar in the gastric fundus - Erythematous mucosa in the gastric body and antrum. Biopsied. - No gross lesions in the duodenal bulb, in the first portion of the duodenum and in the second portion of the duodenum.  EGD 05/07/2019:  Extrinsic impression with mild mucosal abnormality noted in the mid esophagus. Biopsied. - No other gross lesions in esophagus. - Widely patent and non-obstructing Schatzki ring. - 3 cm hiatal hernia. - Non-bleeding gastric ulcer with a small flat spot (Forrest Class IIc) was noted in the fundal region coming off of the region of the diaphragmatic hiatus region. This could be an ulcer on a subepithelial lesion such as a Leiomyoma or GIST, but did not want to tunnel biopsy today based on her clinical picture and status. - Many non-bleeding gastric ulcers and erosions with a clean ulcer base (Forrest Class III) noted. Erythematous mucosa in the stomach. Biopsied for HP. - No gross lesions in the duodenal bulb, in the first portion of the duodenum and in the second portion of the duodenum   Current Outpatient Medications on File Prior to Visit  Medication Sig Dispense Refill   amLODipine (NORVASC) 5 MG tablet TAKE 1 TABLET BY MOUTH DAILY 90 tablet 0  ELIQUIS 5 MG TABS tablet TAKE 1 TABLET(5 MG) BY MOUTH TWICE DAILY 60 tablet 5   furosemide (LASIX) 20 MG tablet TAKE 1 TABLET(20 MG) BY MOUTH DAILY 90 tablet 0   ipratropium (ATROVENT) 0.06 % nasal spray Place 2 sprays into both nostrils 2 (two) times daily.     Lemborexant (DAYVIGO) 10 MG TABS Take 10 mg by mouth at bedtime. 30 tablet 3   metoprolol  tartrate (LOPRESSOR) 25 MG tablet Take 1 tablet (25 mg total) by mouth 2 (two) times daily. 180 tablet 1   montelukast (SINGULAIR) 10 MG tablet TAKE 1 TABLET(10 MG) BY MOUTH AT BEDTIME 90 tablet 0   Multiple Vitamins-Minerals (PRESERVISION AREDS 2 PO) Take 2 capsules by mouth daily.     pantoprazole (PROTONIX) 40 MG tablet TAKE 1 TABLET(40 MG) BY MOUTH TWICE DAILY 60 tablet 0   No current facility-administered medications on file prior to visit.   Allergies  Allergen Reactions   Codeine Phosphate Other (See Comments)    Hallucinations   Adhesive [Tape]     Electrodes for EKG- skin blistering, erythema   Codeine Other (See Comments)    Hallucinations    Current Medications, Allergies, Past Medical History, Past Surgical History, Family History and Social History were reviewed in [employer] record.  Review of Systems:   Constitutional: Negative for fever, sweats, chills or weight loss.  Respiratory: Negative for shortness of breath.   Cardiovascular: Negative for chest pain, palpitations and leg swelling.  Gastrointestinal: See HPI.  Musculoskeletal: Negative for back pain or muscle aches.  Neurological: Negative for dizziness, headaches or paresthesias.   Physical Exam: BP 110/60   Pulse 61   Ht 5\' 6"  (1.676 m)   Wt 186 lb (84.4 kg)   SpO2 97%   BMI 30.02 kg/m  General: 85 year old female in NAD.  Head: Normocephalic and atraumatic. Eyes: No scleral icterus. Conjunctiva pink . Ears: Normal auditory acuity.  Lungs: Clear throughout to auscultation. Heart: Regular rate and rhythm, no murmur. Abdomen: Soft, nontender and nondistended. No masses or hepatomegaly. Normal bowel sounds x 4 quadrants.  Rectal: Deferred.  Musculoskeletal: Symmetrical with no gross deformities. Extremities: No edema. Neurological: Alert oriented x 4. No focal deficits.  Psychological: Alert and cooperative. Normal mood and affect  Assessment and Recommendations:  58) 85  year old female with a history of GERD, UGI bleed likely due to a gastric ulcers as identified per EGD 05/06/2020 in setting of Eliquis and Meloxicam use. Repeat EGD 08/19/2020 showed a scar in the gastric fundus without further evidence of any active ulcers or erosions. She had 3 episodes of severe acid reflux described as hot water which radiated from the stomach into her mouth with vomiting clear emesis while sitting on the commode passing a BM. No associated syncope, dizziness or CP. She remains on Pantoprazole 40mg  po bid.  -Add Famotidine 20mg  Q HS -Continue Pantoprazole 40mg  po bid for now -Miralax 1/2 to 1 capful Q HS as tolerated to facilitate easy bowel movements without straining  -Patient to contact our office if she has any further vomiting episodes or if she passes any black stools or blood per the rectum  -Avoid eating large meals -CBC, CMP -Patient to call me in one 1 week with an update   2) Atrial fibrillation on Eliquis   3) Colon cancer screening. Per Dr. Donneta Romberg EGD procedure notes 08/20/2019 he considered 1 final colonoscopy before ending surveillance/screening due to her age. Colonoscopy in 2012, no  polyps. Mother and maternal aunt with history of colon cancer -Defer recommendations regarding any future colonoscopy evaluation to Dr. Rush Landmark

## 2020-11-04 NOTE — Progress Notes (Signed)
Attending Physician's Attestation   I have reviewed the chart.   I agree with the Advanced Practitioner's note, impression, and recommendations with any updates as below. Hopefully symptomatically she has continued improvement.  If not, consider cross-sectional imaging to further define if any other abnormalities are noted.   Justice Britain, MD [facility] Advanced [facility]oscopy Office # [phone]

## 2020-11-06 ENCOUNTER — Other Ambulatory Visit: Payer: Self-pay | Admitting: Family Medicine

## 2020-11-06 ENCOUNTER — Other Ambulatory Visit: Payer: Self-pay | Admitting: Nurse Practitioner

## 2020-11-10 ENCOUNTER — Telehealth: Payer: Self-pay | Admitting: Nurse Practitioner

## 2020-11-10 NOTE — Telephone Encounter (Signed)
Patient called to give [place] an update she said after taking the Miralax she has been able to have multiple BM's a day but is not straining wants to know should she stop.

## 2020-11-10 NOTE — Telephone Encounter (Signed)
The patient is taking a full dose of Miralax daily at bedtime. She has 3 to 4 bowel movements each day. Wearing Depends because sometimes there is such as urgency, she is unsure she will make it to the bathroom. She does not have any abdominal discomfort. Overall feels well. She would like to adjust the dosage. Discussed options including half a dose daily vs a full dose every other day. Also discussed not to wait until she has not had a daily bowel movement to intervene with increased dosing of Miralax. She opts to try every other day first with telephone follow up in one week.

## 2020-11-11 ENCOUNTER — Ambulatory Visit: Payer: Medicare Other

## 2020-11-11 ENCOUNTER — Ambulatory Visit (INDEPENDENT_AMBULATORY_CARE_PROVIDER_SITE_OTHER): Payer: Medicare Other

## 2020-11-11 ENCOUNTER — Other Ambulatory Visit: Payer: Self-pay

## 2020-11-11 DIAGNOSIS — Z0000 Encounter for general adult medical examination without abnormal findings: Secondary | ICD-10-CM

## 2020-11-11 NOTE — Progress Notes (Addendum)
Virtual Visit via Telephone Note  I connected with  Selena [name] on 11/11/20 at  1:45 PM EDT by telephone and verified that I am speaking with the correct person using two identifiers.  Medicare Annual Wellness visit completed telephonically due to Covid-19 pandemic.   Persons participating in this call: This Health Coach and this patient.   Location: Patient: Home Provider: Office   I discussed the limitations, risks, security and privacy concerns of performing an evaluation and management service by telephone and the availability of in person appointments. The patient expressed understanding and agreed to proceed.  Unable to perform video visit due to video visit attempted and failed and/or patient does not have video capability.   Some vital signs may be absent or patient reported.   Willette Brace, LPN   Subjective:   Selena [name] is a 85 y.o. female who presents for Medicare Annual (Subsequent) preventive examination.  Review of Systems     Cardiac Risk Factors include: advanced age (>65men, >57 women);hypertension;obesity (BMI >30kg/m2)     Objective:    There were no vitals filed for this visit. There is no height or weight on file to calculate BMI.  Advanced Directives 11/11/2020 11/11/2020 04/02/2020 03/26/2020 03/08/2020 11/05/2019 05/06/2019  Does Patient Have a Medical Advance Directive? Yes Yes No No No Yes Yes  Type of Paramedic of [place];Living will [place]  Does patient want to make changes to medical advance directive? - - - - - - No - Patient declined  Copy of [place] in Chart? Yes - validated most recent copy scanned in chart (See row information) Yes - validated most recent copy scanned in chart (See row information) - - - Yes - validated most recent copy scanned in chart (See row information) No - copy  requested  Would patient like information on creating a medical advance directive? - - No - Patient declined No - Patient declined No - Patient declined - No - Patient declined  Pre-existing out of facility DNR order (yellow form or pink MOST form) - - - - - - -    Current Medications (verified) Outpatient Encounter Medications as of 11/11/2020  Medication Sig   amLODipine (NORVASC) 5 MG tablet TAKE 1 TABLET BY MOUTH DAILY   ELIQUIS 5 MG TABS tablet TAKE 1 TABLET(5 MG) BY MOUTH TWICE DAILY   famotidine (PEPCID) 20 MG tablet TAKE 1 TABLET(20 MG) BY MOUTH AT BEDTIME   furosemide (LASIX) 20 MG tablet TAKE 1 TABLET(20 MG) BY MOUTH DAILY   ipratropium (ATROVENT) 0.06 % nasal spray Place 2 sprays into both nostrils 2 (two) times daily.   metoprolol tartrate (LOPRESSOR) 25 MG tablet Take 1 tablet (25 mg total) by mouth 2 (two) times daily.   montelukast (SINGULAIR) 10 MG tablet TAKE 1 TABLET(10 MG) BY MOUTH AT BEDTIME   pantoprazole (PROTONIX) 40 MG tablet TAKE 1 TABLET(40 MG) BY MOUTH TWICE DAILY   No facility-administered encounter medications on file as of 11/11/2020.    Allergies (verified) Codeine phosphate, Adhesive [tape], and Codeine   History: Past Medical History:  Diagnosis Date   Anemia    PMH   Aneurysm (arteriovenous) of coronary vessels    Atrial fibrillation ([place]) 01/01/2007   BACK PAIN, UPPER 07/09/2008   Breast cancer, stage 1 ([place])    Bruises easily    Cataract    history  Complication of anesthesia    Diverticulitis    DIVERTICULOSIS, COLON 12/26/2006   Essential hypertension 01/30/4942   Follicular non-Hodgkin's lymphoma ([place])    Goiter    multi-nodular   Hx of colonoscopy 2009   LIVER FUNCTION TESTS, ABNORMAL, HX OF 12/26/2007   Memory loss 05/16/2007   OSTEOARTHRITIS 12/26/2006   takes Diclofenac daily but has stopped for surgery   PONV (postoperative nausea and vomiting)    Shortness of breath 05/28/2019   Skin cancer    VERTIGO 09/07/2009   Wears dentures     Wears glasses    Wears hearing aid    bilateral   Past Surgical History:  Procedure Laterality Date   ABDOMINAL HYSTERECTOMY     ABLATION OF DYSRHYTHMIC FOCUS  2009   APPENDECTOMY     AUGMENTATION MAMMAPLASTY Right 2013   BIOPSY  05/07/2019   Procedure: BIOPSY;  Surgeon: Irving Copas., MD;  Location: [facility];  Service: Gastroenterology;;   BREAST BIOPSY  2013   BREAST RECONSTRUCTION  04/14/2011   Procedure: BREAST RECONSTRUCTION;  Surgeon: Crissie Reese, MD;  Location: [facility];  Service: Plastics;  Laterality: Right;  Placement of Right Breast Tissue Expander with  use of Flex HD for Breast Reconstruction   BREAST SURGERY     right sm, snbx   CATARACT EXTRACTION W/ INTRAOCULAR LENS  IMPLANT, BILATERAL  2010   bilateral   COLONOSCOPY     COLONOSCOPY     ESOPHAGOGASTRODUODENOSCOPY (EGD) WITH PROPOFOL N/A 05/07/2019   Procedure: ESOPHAGOGASTRODUODENOSCOPY (EGD) WITH PROPOFOL;  Surgeon: Irving Copas., MD;  Location: [facility];  Service: Gastroenterology;  Laterality: N/A;   KNEE SURGERY  2004/2010   arthroscopic/ bilateral knee replacements   LOOP RECORDER IMPLANT     MASTECTOMY Right 2013   PORT-A-CATH REMOVAL N/A 12/07/2016   Procedure: REMOVAL PORT-A-CATH;  Surgeon: Rolm Bookbinder, MD;  Location: [facility];  Service: General;  Laterality: N/A;   PORTACATH PLACEMENT Right 08/11/2014   Procedure: INSERTION PORT-A-CATH WITH ULTRASOUND;  Surgeon: Rolm Bookbinder, MD;  Location: [facility];  Service: General;  Laterality: Right;   ROTATOR CUFF REPAIR  1999   right   tmi  1998   TONSILLECTOMY     Family History  Problem Relation Age of Onset   Colon cancer Mother    Cancer Mother        colon   Colon cancer Maternal Aunt    Cancer Maternal Uncle    Prostate cancer Maternal Uncle    Prostate cancer Maternal Uncle    Other Maternal Uncle    Cancer Sister        kidney   Cancer Brother        lymph node   Anesthesia problems Neg Hx    Hypotension Neg Hx     Malignant hyperthermia Neg Hx    Pseudochol deficiency Neg Hx    Breast cancer Neg Hx    Social History   Socioeconomic History   Marital status: Widowed    Spouse name: Not on file   Number of children: Not on file   Years of education: Not on file   Highest education level: Not on file  Occupational History   Occupation: retired  Tobacco Use   Smoking status: Former    Packs/day: 0.10    Years: 20.00    Pack years: 2.00    Types: Cigarettes    Quit date: 01/24/1978    Years since quitting: 42.8   Smokeless tobacco: Never  Vaping  Use   Vaping Use: Never used  Substance and Sexual Activity   Alcohol use: No   Drug use: No   Sexual activity: Yes    Birth control/protection: Surgical  Other Topics Concern   Not on file  Social History Narrative   Not on file   Social Determinants of Health   Financial Resource Strain: Low Risk    Difficulty of Paying Living Expenses: Not hard at all  Food Insecurity: No Food Insecurity   Worried About Charity fundraiser in the Last Year: Never true   [place] in the Last Year: Never true  Transportation Needs: No Transportation Needs   Lack of Transportation (Medical): No   Lack of Transportation (Non-Medical): No  Physical Activity: Insufficiently Active   Days of Exercise per Week: 7 days   Minutes of Exercise per Session: 10 min  Stress: No Stress Concern Present   Feeling of Stress : Not at all  Social Connections: Moderately Isolated   Frequency of Communication with Friends and Family: More than three times a week   Frequency of Social Gatherings with Friends and Family: Once a week   Attends Religious Services: More than 4 times per year   Active Member of [employer] or Organizations: No   Attends Archivist Meetings: Never   Marital Status: Widowed    Tobacco Counseling Counseling given: Not Answered   Clinical Intake:  Pre-visit preparation completed: Yes  Pain : No/denies pain     BMI -  recorded: 30.04 Nutritional Status: BMI > 30  Obese Nutritional Risks: Nausea/ vomitting/ diarrhea Diabetes: No  How often do you need to have someone help you when you read instructions, pamphlets, or other written materials from your doctor or pharmacy?: 1 - Never  Diabetic?No  Interpreter Needed?: No  Information entered by :: Charlott Rakes, LPN   Activities of Daily Living In your present state of health, do you have any difficulty performing the following activities: 11/11/2020 11/11/2020  Hearing? Tempie Donning  Comment wears hearing aids weras hearing aids  Vision? N N  Difficulty concentrating or making decisions? N N  Walking or climbing stairs? Y Y  Dressing or bathing? N N  Doing errands, shopping? N N  Preparing Food and eating ? N N  Using the Toilet? N N  In the past six months, have you accidently leaked urine? Y N  Do you have problems with loss of bowel control? N N  Managing your Medications? N N  Managing your Finances? N N  Housekeeping or managing your Housekeeping? N N  Some recent data might be hidden    Patient Care Team: Caren Macadam, MD as PCP - General (Family Medicine) Burnell Blanks, MD as PCP - Cardiology (Cardiology)  Indicate any recent Medical Services you may have received from other than [facility] providers in the past year (date may be approximate).     Assessment:   This is a routine wellness examination for Selena [name].  Hearing/Vision screen Hearing Screening - Comments:: Pt wears hearing aids  Vision Screening - Comments:: Pt follows up with Dr Yolanda Bonine and Dr Sharren Bridge for annual eye exams   Dietary issues and exercise activities discussed: Current Exercise Habits: Home exercise routine, Type of exercise: walking, Time (Minutes): 10, Frequency (Times/Week): 7, Weekly Exercise (Minutes/Week): 70   Goals Addressed             This Visit's Progress    Patient Stated  Stop having a runny nose       Depression Screen PHQ  2/9 Scores 11/11/2020 11/11/2020 03/26/2020 03/18/2020 02/19/2020 11/05/2019 05/28/2019  PHQ - 2 Score 0 0 0 0 0 0 0    Fall Risk Fall Risk  11/11/2020 11/11/2020 03/26/2020 03/18/2020 02/19/2020  Falls in the past year? 0 0 1 1 0  Number falls in past yr: 0 0 1 1 0  Injury with Fall? 0 0 1 1 -  Risk for fall due to : Impaired vision;Impaired balance/gait Impaired balance/gait;Impaired vision - History of fall(s) -  Follow up Falls prevention discussed Falls prevention discussed Falls evaluation completed Falls evaluation completed -    FALL RISK PREVENTION PERTAINING TO THE HOME:  Any stairs in or around the home? Yes  If so, are there any without handrails? No  Home free of loose throw rugs in walkways, pet beds, electrical [name], etc? Yes  Adequate lighting in your home to reduce risk of falls? Yes   ASSISTIVE DEVICES UTILIZED TO PREVENT FALLS:  Life alert? Yes  Use of a cane, walker or w/c? Yes  Grab bars in the bathroom? Yes  Shower chair or bench in shower? Yes  Elevated toilet seat or a handicapped toilet? Yes   TIMED UP AND GO:  Was the test performed? No .   Cognitive Function:     6CIT Screen 11/11/2020 11/11/2020 11/05/2019  What Year? 0 points 0 points 0 points  What month? 0 points 0 points 0 points  What time? 0 points 0 points -  Count back from 20 0 points 0 points 0 points  Months in reverse 4 points 4 points 4 points  Repeat phrase 8 points 8 points 4 points  Total Score 12 12 -    Immunizations Immunization History  Administered Date(s) Administered   Fluad Quad(high Dose 65+) 09/26/2018   Influenza Split 10/20/2011   Influenza Whole 11/07/2006   Influenza, High Dose Seasonal PF 10/13/2016, 10/12/2017   Influenza-Unspecified 09/07/2013, 10/15/2015, 09/26/2018, 10/01/2019   Moderna Sars-Covid-2 Vaccination 02/04/2019, 03/04/2019, 12/24/2019   Pneumococcal Conjugate-13 08/26/2013   Pneumococcal Polysaccharide-23 10/29/2007, 08/28/2017   Td 05/27/2008    Tdap 05/30/2018   Zoster, Live 10/29/2007    TDAP status: Up to date  Flu Vaccine status: Up to date  Pneumococcal vaccine status: Up to date  Covid-19 vaccine status: Completed vaccines  Qualifies for Shingles Vaccine? Yes   Zostavax completed Yes   Shingrix Completed?: No.    Education has been provided regarding the importance of this vaccine. Patient has been advised to call insurance company to determine out of pocket expense if they have not yet received this vaccine. Advised may also receive vaccine at local pharmacy or Health Dept. Verbalized acceptance and understanding.  Screening Tests Health Maintenance  Topic Date Due   Zoster Vaccines- Shingrix (1 of 2) Never done   COLONOSCOPY (Pts 45-38yrs Insurance coverage will need to be confirmed)  06/16/2015   COVID-19 Vaccine (4 - Booster for Moderna series) 03/17/2020   TETANUS/TDAP  05/29/2028   Pneumonia Vaccine 14+ Years old  Completed   DEXA SCAN  Completed   HPV VACCINES  Aged Out    Health Maintenance  Health Maintenance Due  Topic Date Due   Zoster Vaccines- Shingrix (1 of 2) Never done   COLONOSCOPY (Pts 45-78yrs Insurance coverage will need to be confirmed)  06/16/2015   COVID-19 Vaccine (4 - Booster for Moderna series) 03/17/2020    Colorectal cancer screening: No longer required.  Mammogram status: Completed 10/19/20. Repeat every year  Bone Density status: Completed 06/24/13. Results reflect: Bone density results: NORMAL. Repeat every 2 years.  Additional Screening:   Vision Screening: Recommended annual ophthalmology exams for early detection of glaucoma and other disorders of the eye. Is the patient up to date with their annual eye exam?  Yes  Who is the provider or what is the name of the office in which the patient attends annual eye exams? Dr Yolanda Bonine / Dr Sharren Bridge If pt is not established with a provider, would they like to be referred to a provider to establish care? No .   Dental Screening:  Recommended annual dental exams for proper oral hygiene  Community Resource Referral / Chronic Care Management: CRR required this visit?  No   CCM required this visit?  No      Plan:     I have personally reviewed and noted the following in the patient's chart:   Medical and social history Use of alcohol, tobacco or illicit drugs  Current medications and supplements including opioid prescriptions.  Functional ability and status Nutritional status Physical activity Advanced directives List of other physicians Hospitalizations, surgeries, and ER visits in previous 12 months Vitals Screenings to include cognitive, depression, and falls Referrals and appointments  In addition, I have reviewed and discussed with patient certain preventive protocols, quality metrics, and best practice recommendations. A written personalized care plan for preventive services as well as general preventive health recommendations were provided to patient.     Willette Brace, LPN   [id number]   Nurse Notes: None

## 2020-11-11 NOTE — Telephone Encounter (Signed)
I agree with Selena [name]'s recommendations as noted below.

## 2020-11-11 NOTE — Progress Notes (Addendum)
Virtual Visit via Telephone Note  I connected with  Selena [name] on 11/11/20 at  1:45 PM EDT by telephone and verified that I am speaking with the correct person using two identifiers.  Medicare Annual Wellness visit completed telephonically due to Covid-19 pandemic.   Persons participating in this call: This Health Coach and this patient.   Location: Patient: home Provider: office   I discussed the limitations, risks, security and privacy concerns of performing an evaluation and management service by telephone and the availability of in person appointments. The patient expressed understanding and agreed to proceed.  Unable to perform video visit due to video visit attempted and failed and/or patient does not have video capability.   Some vital signs may be absent or patient reported.   Willette Brace, LPN   Subjective:   Selena [name] is a 85 y.o. female who presents for Medicare Annual (Subsequent) preventive examination.  Review of Systems     Cardiac Risk Factors include: advanced age (>22men, >66 women);hypertension;obesity (BMI >30kg/m2)     Objective:    There were no vitals filed for this visit. There is no height or weight on file to calculate BMI.  Advanced Directives 11/11/2020 04/02/2020 03/26/2020 03/08/2020 11/05/2019 05/06/2019 04/22/2019  Does Patient Have a Medical Advance Directive? Yes No No No Yes Yes Yes  Type of Advance Directive [place]  Does patient want to make changes to medical advance directive? - - - - - No - Patient declined No - Patient declined  Copy of Lena in Chart? Yes - validated most recent copy scanned in chart (See row information) - - - Yes - validated most recent copy scanned in chart (See row information) No - copy requested No - copy requested  Would patient like information on  creating a medical advance directive? - No - Patient declined No - Patient declined No - Patient declined - No - Patient declined -  Pre-existing out of facility DNR order (yellow form or pink MOST form) - - - - - - -    Current Medications (verified) Outpatient Encounter Medications as of 11/11/2020  Medication Sig   amLODipine (NORVASC) 5 MG tablet TAKE 1 TABLET BY MOUTH DAILY   ELIQUIS 5 MG TABS tablet TAKE 1 TABLET(5 MG) BY MOUTH TWICE DAILY   famotidine (PEPCID) 20 MG tablet TAKE 1 TABLET(20 MG) BY MOUTH AT BEDTIME   furosemide (LASIX) 20 MG tablet TAKE 1 TABLET(20 MG) BY MOUTH DAILY   ipratropium (ATROVENT) 0.06 % nasal spray Place 2 sprays into both nostrils 2 (two) times daily.   metoprolol tartrate (LOPRESSOR) 25 MG tablet Take 1 tablet (25 mg total) by mouth 2 (two) times daily.   montelukast (SINGULAIR) 10 MG tablet TAKE 1 TABLET(10 MG) BY MOUTH AT BEDTIME   pantoprazole (PROTONIX) 40 MG tablet TAKE 1 TABLET(40 MG) BY MOUTH TWICE DAILY   [DISCONTINUED] Lemborexant (DAYVIGO) 10 MG TABS Take 10 mg by mouth at bedtime.   [DISCONTINUED] Multiple Vitamins-Minerals (PRESERVISION AREDS 2 PO) Take 2 capsules by mouth daily. (Patient not taking: Reported on 11/11/2020)   No facility-administered encounter medications on file as of 11/11/2020.    Allergies (verified) Codeine phosphate, Adhesive [tape], and Codeine   History: Past Medical History:  Diagnosis Date   Anemia    PMH   Aneurysm (arteriovenous) of coronary vessels    Atrial fibrillation (  [place]) 01/01/2007   BACK PAIN, UPPER 07/09/2008   Breast cancer, stage 1 ([place])    Bruises easily    Cataract    history   Complication of anesthesia    Diverticulitis    DIVERTICULOSIS, COLON 12/26/2006   Essential hypertension 74/01/2876   Follicular non-Hodgkin's lymphoma ([place])    Goiter    multi-nodular   Hx of colonoscopy 2009   LIVER FUNCTION TESTS, ABNORMAL, HX OF 12/26/2007   Memory loss 05/16/2007   OSTEOARTHRITIS 12/26/2006    takes Diclofenac daily but has stopped for surgery   PONV (postoperative nausea and vomiting)    Shortness of breath 05/28/2019   Skin cancer    VERTIGO 09/07/2009   Wears dentures    Wears glasses    Wears hearing aid    bilateral   Past Surgical History:  Procedure Laterality Date   ABDOMINAL HYSTERECTOMY     ABLATION OF DYSRHYTHMIC FOCUS  2009   APPENDECTOMY     AUGMENTATION MAMMAPLASTY Right 2013   BIOPSY  05/07/2019   Procedure: BIOPSY;  Surgeon: Irving Copas., MD;  Location: [facility];  Service: Gastroenterology;;   BREAST BIOPSY  2013   BREAST RECONSTRUCTION  04/14/2011   Procedure: BREAST RECONSTRUCTION;  Surgeon: Crissie Reese, MD;  Location: [facility];  Service: Plastics;  Laterality: Right;  Placement of Right Breast Tissue Expander with  use of Flex HD for Breast Reconstruction   BREAST SURGERY     right sm, snbx   CATARACT EXTRACTION W/ INTRAOCULAR LENS  IMPLANT, BILATERAL  2010   bilateral   COLONOSCOPY     COLONOSCOPY     ESOPHAGOGASTRODUODENOSCOPY (EGD) WITH PROPOFOL N/A 05/07/2019   Procedure: ESOPHAGOGASTRODUODENOSCOPY (EGD) WITH PROPOFOL;  Surgeon: Irving Copas., MD;  Location: [facility];  Service: Gastroenterology;  Laterality: N/A;   KNEE SURGERY  2004/2010   arthroscopic/ bilateral knee replacements   LOOP RECORDER IMPLANT     MASTECTOMY Right 2013   PORT-A-CATH REMOVAL N/A 12/07/2016   Procedure: REMOVAL PORT-A-CATH;  Surgeon: Rolm Bookbinder, MD;  Location: [facility];  Service: General;  Laterality: N/A;   PORTACATH PLACEMENT Right 08/11/2014   Procedure: INSERTION PORT-A-CATH WITH ULTRASOUND;  Surgeon: Rolm Bookbinder, MD;  Location: [facility];  Service: General;  Laterality: Right;   ROTATOR CUFF REPAIR  1999   right   tmi  1998   TONSILLECTOMY     Family History  Problem Relation Age of Onset   Colon cancer Mother    Cancer Mother        colon   Colon cancer Maternal Aunt    Cancer Maternal Uncle    Prostate cancer Maternal Uncle     Prostate cancer Maternal Uncle    Other Maternal Uncle    Cancer Sister        kidney   Cancer Brother        lymph node   Anesthesia problems Neg Hx    Hypotension Neg Hx    Malignant hyperthermia Neg Hx    Pseudochol deficiency Neg Hx    Breast cancer Neg Hx    Social History   Socioeconomic History   Marital status: Widowed    Spouse name: Not on file   Number of children: Not on file   Years of education: Not on file   Highest education level: Not on file  Occupational History   Occupation: retired  Tobacco Use   Smoking status: Former    Packs/day: 0.10    Years: 20.00  Pack years: 2.00    Types: Cigarettes    Quit date: 01/24/1978    Years since quitting: 42.8   Smokeless tobacco: Never  Vaping Use   Vaping Use: Never used  Substance and Sexual Activity   Alcohol use: No   Drug use: No   Sexual activity: Yes    Birth control/protection: Surgical  Other Topics Concern   Not on file  Social History Narrative   Not on file   Social Determinants of Health   Financial Resource Strain: Low Risk    Difficulty of Paying Living Expenses: Not hard at all  Food Insecurity: No Food Insecurity   Worried About Charity fundraiser in the Last Year: Never true   Ran Out of Food in the Last Year: Never true  Transportation Needs: No Transportation Needs   Lack of Transportation (Medical): No   Lack of Transportation (Non-Medical): No  Physical Activity: Insufficiently Active   Days of Exercise per Week: 7 days   Minutes of Exercise per Session: 10 min  Stress: No Stress Concern Present   Feeling of Stress : Not at all  Social Connections: Moderately Isolated   Frequency of Communication with Friends and Family: More than three times a week   Frequency of Social Gatherings with Friends and Family: Once a week   Attends Religious Services: More than 4 times per year   Active Member of [employer] or Organizations: No   Attends Archivist Meetings: Never    Marital Status: Widowed    Tobacco Counseling Counseling given: Not Answered   Clinical Intake:  Pre-visit preparation completed: Yes  Pain : No/denies pain     BMI - recorded: 30.04 Nutritional Status: BMI > 30  Obese Nutritional Risks: Nausea/ vomitting/ diarrhea (loose stool related to medicine) Diabetes: No  How often do you need to have someone help you when you read instructions, pamphlets, or other written materials from your doctor or pharmacy?: 1 - Never  Diabetic?no  Interpreter Needed?: No  Information entered by :: Charlott Rakes, LPN   Activities of Daily Living In your present state of health, do you have any difficulty performing the following activities: 11/11/2020 04/03/2020  Hearing? Y Y  Comment weras hearing aids -  Vision? N Y  Difficulty concentrating or making decisions? N N  Walking or climbing stairs? Y Y  Dressing or bathing? N N  Doing errands, shopping? N N  Preparing Food and eating ? N -  Using the Toilet? N -  In the past six months, have you accidently leaked urine? N -  Do you have problems with loss of bowel control? N -  Managing your Medications? N -  Managing your Finances? N -  Housekeeping or managing your Housekeeping? N -  Some recent data might be hidden    Patient Care Team: Caren Macadam, MD as PCP - General (Family Medicine) Burnell Blanks, MD as PCP - Cardiology (Cardiology)  Indicate any recent Medical Services you may have received from other than [facility] providers in the past year (date may be approximate).     Assessment:   This is a routine wellness examination for Lumi.  Hearing/Vision screen Hearing Screening - Comments:: Pt wears hearing aids Vision Screening - Comments:: Pt follows up with Dr Malachy Mood for annual eye exams   Dietary issues and exercise activities discussed: Current Exercise Habits: Home exercise routine, Type of exercise: walking, Time (Minutes): 10, Frequency  (Times/Week): 7, Weekly Exercise (Minutes/Week):  70   Goals Addressed             This Visit's Progress    Patient Stated       Get nose to stop running        Depression Screen PHQ 2/9 Scores 11/11/2020 03/26/2020 03/18/2020 02/19/2020 11/05/2019 05/28/2019 08/28/2017  PHQ - 2 Score 0 0 0 0 0 0 0    Fall Risk Fall Risk  11/11/2020 03/26/2020 03/18/2020 02/19/2020 11/05/2019  Falls in the past year? 0 1 1 0 0  Number falls in past yr: 0 1 1 0 0  Injury with Fall? 0 1 1 - 0  Risk for fall due to : Impaired balance/gait;Impaired vision - History of fall(s) - Impaired mobility;Impaired balance/gait;Impaired vision  Follow up Falls prevention discussed Falls evaluation completed Falls evaluation completed - Falls prevention discussed    FALL RISK PREVENTION PERTAINING TO THE HOME:  Any stairs in or around the home? Yes  If so, are there any without handrails? No  Home free of loose throw rugs in walkways, pet beds, electrical [name], etc? Yes  Adequate lighting in your home to reduce risk of falls? Yes   ASSISTIVE DEVICES UTILIZED TO PREVENT FALLS:  Life alert? Yes  Use of a cane, walker or w/c? Yes  Grab bars in the bathroom? Yes  Shower chair or bench in shower? Yes  Elevated toilet seat or a handicapped toilet? Yes   TIMED UP AND GO:  Was the test performed? No .  Cognitive Function:     6CIT Screen 11/11/2020 11/05/2019  What Year? 0 points 0 points  What month? 0 points 0 points  What time? 0 points -  Count back from 20 0 points 0 points  Months in reverse 4 points 4 points  Repeat phrase 8 points 4 points  Total Score 12 -    Immunizations Immunization History  Administered Date(s) Administered   Fluad Quad(high Dose 65+) 09/26/2018   Influenza Split 10/20/2011   Influenza Whole 11/07/2006   Influenza, High Dose Seasonal PF 10/13/2016, 10/12/2017   Influenza-Unspecified 09/07/2013, 10/15/2015, 09/26/2018, 10/01/2019   Moderna Sars-Covid-2 Vaccination  02/04/2019, 03/04/2019, 12/24/2019   Pneumococcal Conjugate-13 08/26/2013   Pneumococcal Polysaccharide-23 10/29/2007, 08/28/2017   Td 05/27/2008   Tdap 05/30/2018   Zoster, Live 10/29/2007    TDAP status: Up to date  Flu Vaccine status: Due, Education has been provided regarding the importance of this vaccine. Advised may receive this vaccine at local pharmacy or Health Dept. Aware to provide a copy of the vaccination record if obtained from local pharmacy or Health Dept. Verbalized acceptance and understanding.  Pneumococcal vaccine status: Up to date  Covid-19 vaccine status: Completed vaccines  Qualifies for Shingles Vaccine? Yes   Zostavax completed Yes   Shingrix Completed?: No.    Education has been provided regarding the importance of this vaccine. Patient has been advised to call insurance company to determine out of pocket expense if they have not yet received this vaccine. Advised may also receive vaccine at local pharmacy or Health Dept. Verbalized acceptance and understanding.  Screening Tests Health Maintenance  Topic Date Due   Zoster Vaccines- Shingrix (1 of 2) Never done   COLONOSCOPY (Pts 45-27yrs Insurance coverage will need to be confirmed)  06/16/2015   COVID-19 Vaccine (4 - Booster for Moderna series) 03/17/2020   TETANUS/TDAP  05/29/2028   Pneumonia Vaccine 80+ Years old  Completed   DEXA SCAN  Completed   HPV VACCINES  Aged Out  Health Maintenance  Health Maintenance Due  Topic Date Due   Zoster Vaccines- Shingrix (1 of 2) Never done   COLONOSCOPY (Pts 45-71yrs Insurance coverage will need to be confirmed)  06/16/2015   COVID-19 Vaccine (4 - Booster for Moderna series) 03/17/2020    Colorectal cancer screening: No longer required.   Mammogram status: Completed 10/19/20. Repeat every year  Bone Density status: Completed 06/24/13. Results reflect: Bone density results: NORMAL. Repeat every 2 years.   Additional Screening:  vision Screening:  Recommended annual ophthalmology exams for early detection of glaucoma and other disorders of the eye. Is the patient up to date with their annual eye exam?  Yes  Who is the provider or what is the name of the office in which the patient attends annual eye exams? Dr Sharren Bridge Dr Yolanda Bonine If pt is not established with a provider, would they like to be referred to a provider to establish care? No .   Dental Screening: Recommended annual dental exams for proper oral hygiene  Community Resource Referral / Chronic Care Management: CRR required this visit?  No   CCM required this visit?  No      Plan:     I have personally reviewed and noted the following in the patient's chart:   Medical and social history Use of alcohol, tobacco or illicit drugs  Current medications and supplements including opioid prescriptions.  Functional ability and status Nutritional status Physical activity Advanced directives List of other physicians Hospitalizations, surgeries, and ER visits in previous 12 months Vitals Screenings to include cognitive, depression, and falls Referrals and appointments  In addition, I have reviewed and discussed with patient certain preventive protocols, quality metrics, and best practice recommendations. A written personalized care plan for preventive services as well as general preventive health recommendations were provided to patient.     Willette Brace, LPN   57/32/2025   Nurse Notes: None

## 2020-11-11 NOTE — Patient Instructions (Signed)
Selena [name] , Thank you for taking time to come for your Medicare Wellness Visit. I appreciate your ongoing commitment to your health goals. Please review the following plan we discussed and let me know if I can assist you in the future.   Screening recommendations/referrals: Colonoscopy: no longer required  Mammogram: Done 10/19/20 repeat every year Bone Density: Done 06/24/13 repeat every 2 years Recommended yearly ophthalmology/optometry visit for glaucoma screening and checkup Recommended yearly dental visit for hygiene and checkup  Vaccinations: Influenza vaccine: Pt stated completed  Pneumococcal vaccine: Up to date Tdap vaccine: Done 05/30/18 repeat every 10 years  Shingles vaccine: Shingrix discussed. Please contact your pharmacy for coverage information.    Covid-19:Completed 1/11, 2/8, 12/24/19   Advanced directives: Please bring a copy of your health care power of attorney and living will to the office at your convenience.  Conditions/risks identified: Stop nose from being runny  Next appointment: Follow up in one year for your annual wellness visit    Preventive Care 65 Years and Older, Female Preventive care refers to lifestyle choices and visits with your health care provider that can promote health and wellness. What does preventive care include? A yearly physical exam. This is also called an annual well check. Dental exams once or twice a year. Routine eye exams. Ask your health care provider how often you should have your eyes checked. Personal lifestyle choices, including: Daily care of your teeth and gums. Regular physical activity. Eating a healthy diet. Avoiding tobacco and drug use. Limiting alcohol use. Practicing safe sex. Taking low-dose aspirin every day. Taking vitamin and mineral supplements as recommended by your health care provider. What happens during an annual well check? The services and screenings done by your health care provider during your  annual well check will depend on your age, overall health, lifestyle risk factors, and family history of disease. Counseling  Your health care provider may ask you questions about your: Alcohol use. Tobacco use. Drug use. Emotional well-being. Home and relationship well-being. Sexual activity. Eating habits. History of falls. Memory and ability to understand (cognition). Work and work Statistician. Reproductive health. Screening  You may have the following tests or measurements: Height, weight, and BMI. Blood pressure. Lipid and cholesterol levels. These may be checked every 5 years, or more frequently if you are over 60 years old. Skin check. Lung cancer screening. You may have this screening every year starting at age 16 if you have a 30-pack-year history of smoking and currently smoke or have quit within the past 15 years. Fecal occult blood test (FOBT) of the stool. You may have this test every year starting at age 48. Flexible sigmoidoscopy or colonoscopy. You may have a sigmoidoscopy every 5 years or a colonoscopy every 10 years starting at age 57. Hepatitis C blood test. Hepatitis B blood test. Sexually transmitted disease (STD) testing. Diabetes screening. This is done by checking your blood sugar (glucose) after you have not eaten for a while (fasting). You may have this done every 1-3 years. Bone density scan. This is done to screen for osteoporosis. You may have this done starting at age 40. Mammogram. This may be done every 1-2 years. Talk to your health care provider about how often you should have regular mammograms. Talk with your health care provider about your test results, treatment options, and if necessary, the need for more tests. Vaccines  Your health care provider may recommend certain vaccines, such as: Influenza vaccine. This is recommended every year. Tetanus, diphtheria,  and acellular pertussis (Tdap, Td) vaccine. You may need a Td booster every 10  years. Zoster vaccine. You may need this after age 46. Pneumococcal 13-valent conjugate (PCV13) vaccine. One dose is recommended after age 28. Pneumococcal polysaccharide (PPSV23) vaccine. One dose is recommended after age 55. Talk to your health care provider about which screenings and vaccines you need and how often you need them. This information is not intended to replace advice given to you by your health care provider. Make sure you discuss any questions you have with your health care provider. Document Released: 02/06/2015 Document Revised: 09/30/2015 Document Reviewed: 11/11/2014 Elsevier Interactive Patient Education  2017 [place] Prevention in the Home Falls can cause injuries. They can happen to people of all ages. There are many things you can do to make your home safe and to help prevent falls. What can I do on the outside of my home? Regularly fix the edges of walkways and driveways and fix any cracks. Remove anything that might make you trip as you walk through a door, such as a raised step or threshold. Trim any bushes or trees on the path to your home. Use bright outdoor lighting. Clear any walking paths of anything that might make someone trip, such as rocks or tools. Regularly check to see if handrails are loose or broken. Make sure that both sides of any steps have handrails. Any raised decks and porches should have guardrails on the edges. Have any leaves, snow, or ice cleared regularly. Use sand or salt on walking paths during winter. Clean up any spills in your garage right away. This includes oil or grease spills. What can I do in the bathroom? Use night lights. Install grab bars by the toilet and in the tub and shower. Do not use towel bars as grab bars. Use non-skid mats or decals in the tub or shower. If you need to sit down in the shower, use a plastic, non-slip stool. Keep the floor dry. Clean up any water that spills on the floor as soon as it  happens. Remove soap buildup in the tub or shower regularly. Attach bath mats securely with double-sided non-slip rug tape. Do not have throw rugs and other things on the floor that can make you trip. What can I do in the bedroom? Use night lights. Make sure that you have a light by your bed that is easy to reach. Do not use any sheets or blankets that are too big for your bed. They should not hang down onto the floor. Have a firm chair that has side arms. You can use this for support while you get dressed. Do not have throw rugs and other things on the floor that can make you trip. What can I do in the kitchen? Clean up any spills right away. Avoid walking on wet floors. Keep items that you use a lot in easy-to-reach places. If you need to reach something above you, use a strong step stool that has a grab bar. Keep electrical cords out of the way. Do not use floor polish or wax that makes floors slippery. If you must use wax, use non-skid floor wax. Do not have throw rugs and other things on the floor that can make you trip. What can I do with my stairs? Do not leave any items on the stairs. Make sure that there are handrails on both sides of the stairs and use them. Fix handrails that are broken or loose. Make sure  that handrails are as long as the stairways. Check any carpeting to make sure that it is firmly attached to the stairs. Fix any carpet that is loose or worn. Avoid having throw rugs at the top or bottom of the stairs. If you do have throw rugs, attach them to the floor with carpet tape. Make sure that you have a light switch at the top of the stairs and the bottom of the stairs. If you do not have them, ask someone to add them for you. What else can I do to help prevent falls? Wear shoes that: Do not have high heels. Have rubber bottoms. Are comfortable and fit you well. Are closed at the toe. Do not wear sandals. If you use a stepladder: Make sure that it is fully opened.  Do not climb a closed stepladder. Make sure that both sides of the stepladder are locked into place. Ask someone to hold it for you, if possible. Clearly mark and make sure that you can see: Any grab bars or handrails. First and last steps. Where the edge of each step is. Use tools that help you move around (mobility aids) if they are needed. These include: Canes. Walkers. Scooters. Crutches. Turn on the lights when you go into a dark area. Replace any light bulbs as soon as they burn out. Set up your furniture so you have a clear path. Avoid moving your furniture around. If any of your floors are uneven, fix them. If there are any pets around you, be aware of where they are. Review your medicines with your doctor. Some medicines can make you feel dizzy. This can increase your chance of falling. Ask your doctor what other things that you can do to help prevent falls. This information is not intended to replace advice given to you by your health care provider. Make sure you discuss any questions you have with your health care provider. Document Released: 11/06/2008 Document Revised: 06/18/2015 Document Reviewed: 02/14/2014 Elsevier Interactive Patient Education  2017 [employer].

## 2020-11-11 NOTE — Patient Instructions (Signed)
Selena [name] , Thank you for taking time to come for your Medicare Wellness Visit. I appreciate your ongoing commitment to your health goals. Please review the following plan we discussed and let me know if I can assist you in the future.   Screening recommendations/referrals: Colonoscopy: no longer required Mammogram: done 10/19/20 repeat every year Bone Density: done 06/24/13 repeat every 2 years  Recommended yearly ophthalmology/optometry visit for glaucoma screening and checkup Recommended yearly dental visit for hygiene and checkup  Vaccinations: Pneumococcal vaccine: Up to date Tdap vaccine: Done 05/30/18 repeat every 10 years Shingles vaccine: Shingrix discussed. Please contact your pharmacy for coverage information.    Covid-19:completed 1/11, 2/8, &12/24/19  Advanced directives: copies in chart  Conditions/risks identified: Get rid of this runny nose   Next appointment: Follow up in one year for your annual wellness visit    Preventive Care 65 Years and Older, Female Preventive care refers to lifestyle choices and visits with your health care provider that can promote health and wellness. What does preventive care include? A yearly physical exam. This is also called an annual well check. Dental exams once or twice a year. Routine eye exams. Ask your health care provider how often you should have your eyes checked. Personal lifestyle choices, including: Daily care of your teeth and gums. Regular physical activity. Eating a healthy diet. Avoiding tobacco and drug use. Limiting alcohol use. Practicing safe sex. Taking low-dose aspirin every day. Taking vitamin and mineral supplements as recommended by your health care provider. What happens during an annual well check? The services and screenings done by your health care provider during your annual well check will depend on your age, overall health, lifestyle risk factors, and family history of disease. Counseling  Your  health care provider may ask you questions about your: Alcohol use. Tobacco use. Drug use. Emotional well-being. Home and relationship well-being. Sexual activity. Eating habits. History of falls. Memory and ability to understand (cognition). Work and work Statistician. Reproductive health. Screening  You may have the following tests or measurements: Height, weight, and BMI. Blood pressure. Lipid and cholesterol levels. These may be checked every 5 years, or more frequently if you are over 70 years old. Skin check. Lung cancer screening. You may have this screening every year starting at age 41 if you have a 30-pack-year history of smoking and currently smoke or have quit within the past 15 years. Fecal occult blood test (FOBT) of the stool. You may have this test every year starting at age 66. Flexible sigmoidoscopy or colonoscopy. You may have a sigmoidoscopy every 5 years or a colonoscopy every 10 years starting at age 9. Hepatitis C blood test. Hepatitis B blood test. Sexually transmitted disease (STD) testing. Diabetes screening. This is done by checking your blood sugar (glucose) after you have not eaten for a while (fasting). You may have this done every 1-3 years. Bone density scan. This is done to screen for osteoporosis. You may have this done starting at age 8. Mammogram. This may be done every 1-2 years. Talk to your health care provider about how often you should have regular mammograms. Talk with your health care provider about your test results, treatment options, and if necessary, the need for more tests. Vaccines  Your health care provider may recommend certain vaccines, such as: Influenza vaccine. This is recommended every year. Tetanus, diphtheria, and acellular pertussis (Tdap, Td) vaccine. You may need a Td booster every 10 years. Zoster vaccine. You may need this after age  60. Pneumococcal 13-valent conjugate (PCV13) vaccine. One dose is recommended after age  70. Pneumococcal polysaccharide (PPSV23) vaccine. One dose is recommended after age 47. Talk to your health care provider about which screenings and vaccines you need and how often you need them. This information is not intended to replace advice given to you by your health care provider. Make sure you discuss any questions you have with your health care provider. Document Released: 02/06/2015 Document Revised: 09/30/2015 Document Reviewed: 11/11/2014 Elsevier Interactive Patient Education  2017 [place] Prevention in the Home Falls can cause injuries. They can happen to people of all ages. There are many things you can do to make your home safe and to help prevent falls. What can I do on the outside of my home? Regularly fix the edges of walkways and driveways and fix any cracks. Remove anything that might make you trip as you walk through a door, such as a raised step or threshold. Trim any bushes or trees on the path to your home. Use bright outdoor lighting. Clear any walking paths of anything that might make someone trip, such as rocks or tools. Regularly check to see if handrails are loose or broken. Make sure that both sides of any steps have handrails. Any raised decks and porches should have guardrails on the edges. Have any leaves, snow, or ice cleared regularly. Use sand or salt on walking paths during winter. Clean up any spills in your garage right away. This includes oil or grease spills. What can I do in the bathroom? Use night lights. Install grab bars by the toilet and in the tub and shower. Do not use towel bars as grab bars. Use non-skid mats or decals in the tub or shower. If you need to sit down in the shower, use a plastic, non-slip stool. Keep the floor dry. Clean up any water that spills on the floor as soon as it happens. Remove soap buildup in the tub or shower regularly. Attach bath mats securely with double-sided non-slip rug tape. Do not have throw  rugs and other things on the floor that can make you trip. What can I do in the bedroom? Use night lights. Make sure that you have a light by your bed that is easy to reach. Do not use any sheets or blankets that are too big for your bed. They should not hang down onto the floor. Have a firm chair that has side arms. You can use this for support while you get dressed. Do not have throw rugs and other things on the floor that can make you trip. What can I do in the kitchen? Clean up any spills right away. Avoid walking on wet floors. Keep items that you use a lot in easy-to-reach places. If you need to reach something above you, use a strong step stool that has a grab bar. Keep electrical cords out of the way. Do not use floor polish or wax that makes floors slippery. If you must use wax, use non-skid floor wax. Do not have throw rugs and other things on the floor that can make you trip. What can I do with my stairs? Do not leave any items on the stairs. Make sure that there are handrails on both sides of the stairs and use them. Fix handrails that are broken or loose. Make sure that handrails are as long as the stairways. Check any carpeting to make sure that it is firmly attached to the stairs. Fix  any carpet that is loose or worn. Avoid having throw rugs at the top or bottom of the stairs. If you do have throw rugs, attach them to the floor with carpet tape. Make sure that you have a light switch at the top of the stairs and the bottom of the stairs. If you do not have them, ask someone to add them for you. What else can I do to help prevent falls? Wear shoes that: Do not have high heels. Have rubber bottoms. Are comfortable and fit you well. Are closed at the toe. Do not wear sandals. If you use a stepladder: Make sure that it is fully opened. Do not climb a closed stepladder. Make sure that both sides of the stepladder are locked into place. Ask someone to hold it for you, if  possible. Clearly mark and make sure that you can see: Any grab bars or handrails. First and last steps. Where the edge of each step is. Use tools that help you move around (mobility aids) if they are needed. These include: Canes. Walkers. Scooters. Crutches. Turn on the lights when you go into a dark area. Replace any light bulbs as soon as they burn out. Set up your furniture so you have a clear path. Avoid moving your furniture around. If any of your floors are uneven, fix them. If there are any pets around you, be aware of where they are. Review your medicines with your doctor. Some medicines can make you feel dizzy. This can increase your chance of falling. Ask your doctor what other things that you can do to help prevent falls. This information is not intended to replace advice given to you by your health care provider. Make sure you discuss any questions you have with your health care provider. Document Released: 11/06/2008 Document Revised: 06/18/2015 Document Reviewed: 02/14/2014 Elsevier Interactive Patient Education  2017 [employer].

## 2020-11-23 ENCOUNTER — Telehealth (INDEPENDENT_AMBULATORY_CARE_PROVIDER_SITE_OTHER): Payer: Medicare Other | Admitting: Family Medicine

## 2020-11-23 ENCOUNTER — Encounter: Payer: Self-pay | Admitting: Family Medicine

## 2020-11-23 VITALS — BP 112/70 | HR 98 | Ht 66.0 in

## 2020-11-23 DIAGNOSIS — R051 Acute cough: Secondary | ICD-10-CM | POA: Diagnosis not present

## 2020-11-23 DIAGNOSIS — J069 Acute upper respiratory infection, unspecified: Secondary | ICD-10-CM | POA: Diagnosis not present

## 2020-11-23 MED ORDER — BENZONATATE 100 MG PO CAPS: 200.0000 mg | ORAL_CAPSULE | Freq: Two times a day (BID) | ORAL | 0 refills | Status: DC | PRN

## 2020-11-23 NOTE — Progress Notes (Signed)
Virtual Visit via Telephone Note I connected with Selena [name] on 11/23/20 at  4:00 PM EDT by telephone and verified that I am speaking with the correct person using two identifiers.   I discussed the limitations, risks, security and privacy concerns of performing an evaluation and management service by telephone and the availability of in person appointments. I also discussed with the patient that there may be a patient responsible charge related to this service. The patient expressed understanding and agreed to proceed.  Location patient: home Location provider: work office Participants present for the call: patient, provider Patient did not have a visit in the prior 7 days to address this/these issue(s).  Chief Complaint  Patient presents with   Cough   Sore Throat   History of Present Illness: Selena [name] is a 85 y.o.female with hx of atrial fibrillation, hypertension, CHF, thoracic aortic aneurysm, and OSA c/o 3 to 4 days of respiratory symptoms. Productive cough with clearish sputum. Clear rhinorrhea and fatigue. Negative for anosmia,ageusia,wheezing,changes in bowel habits.urinary symptoms,or skin rash.  Cough This is a new problem. The current episode started in the past 7 days. The problem has been unchanged. The problem occurs every few hours. The cough is Productive of sputum. Associated symptoms include nasal congestion, postnasal drip, rhinorrhea and a sore throat. Pertinent negatives include no chest pain, chills, ear congestion, ear pain, fever, headaches, heartburn, hemoptysis, myalgias, shortness of breath, sweats or weight loss. Nothing aggravates the symptoms.  Sore Throat  This is a new problem. The problem has been unchanged. There has been no fever. The pain is mild. Associated symptoms include coughing. Pertinent negatives include no abdominal pain, congestion, diarrhea, ear discharge, ear pain, headaches, hoarse voice, plugged ear sensation or shortness of  breath. She has had no exposure to strep.   She has tried OTC Coricidin, which is not helping.  Negative for sick contacts or recent travel. She lives alone but her 2 children are checking on her regularly.  COVID-19 vaccination completed, she also had her flu vaccine a couple weeks ago. She has not done a COVID-19 test.  Observations/Objective: Patient sounds cheerful and well on the phone. I do not appreciate any SOB. Speech and thought processing are grossly intact. Patient reported vitals:BP 112/70   Pulse 98   Ht 5\' 6"  (1.676 m)   SpO2 97%   BMI 30.02 kg/m   Assessment and Plan:  Diagnoses and all orders for this visit:  URI, acute Symptoms suggests a viral etiology, so symptomatic treatment is recommended. COVID 19 infection is to be considered, recommend doing a home test or having it done at the pharmacy, ideally today. Plenty of p.o. fluids, rest, Tylenol 500 mg 3 to 4 tablets daily if needed for pain. Throat lozenges to help with sore throat. Continue Atrovent 0.06% nasal spray.  Instructed to monitor for signs of complications, including new onset of fever among some, clearly instructed about warning signs. F/U as needed.  Acute cough I explained that cough and nasal congestion can last a few days and sometimes weeks. Benzonatate for symptomatic treatment. Adequate hydration. I do not think imaging is needed at this time.  -     benzonatate (TESSALON) 100 MG capsule; Take 2 capsules (200 mg total) by mouth 2 (two) times daily as needed for up to 10 days.  Follow Up Instructions:  Return if symptoms worsen or fail to improve.  I did not refer this patient for an OV in the next 24 hours  for this/these issue(s).  I discussed the assessment and treatment plan with the patient. She was provided an opportunity to ask questions and all were answered. She agreed with the plan and demonstrated an understanding of the instructions.   The patient was advised to call  back or seek an in-person evaluation if the symptoms worsen or if the condition fails to improve as anticipated.  I provided 11 minutes of non-face-to-face time during this encounter.  Abir Eroh G. Martinique, MD  [facility]. [place] office.

## 2020-11-30 ENCOUNTER — Ambulatory Visit (HOSPITAL_COMMUNITY)
Admission: EM | Admit: 2020-11-30 | Discharge: 2020-11-30 | Disposition: A | Discharge status: Home / Self Care | Payer: Medicare Other | Attending: Internal Medicine | Admitting: Internal Medicine

## 2020-11-30 ENCOUNTER — Encounter (HOSPITAL_COMMUNITY): Payer: Self-pay | Admitting: Emergency Medicine

## 2020-11-30 ENCOUNTER — Other Ambulatory Visit: Payer: Self-pay

## 2020-11-30 DIAGNOSIS — J069 Acute upper respiratory infection, unspecified: Secondary | ICD-10-CM

## 2020-11-30 DIAGNOSIS — J208 Acute bronchitis due to other specified organisms: Secondary | ICD-10-CM

## 2020-11-30 DIAGNOSIS — R051 Acute cough: Secondary | ICD-10-CM

## 2020-11-30 MED ORDER — ALBUTEROL SULFATE HFA 108 (90 BASE) MCG/ACT IN AERS: 2.0000 | INHALATION_SPRAY | RESPIRATORY_TRACT | 0 refills | Status: DC | PRN

## 2020-11-30 MED ORDER — BENZONATATE 100 MG PO CAPS: 200.0000 mg | ORAL_CAPSULE | Freq: Three times a day (TID) | ORAL | 0 refills | Status: AC

## 2020-11-30 MED ORDER — FEXOFENADINE HCL 180 MG PO TABS: 180.0000 mg | ORAL_TABLET | Freq: Every day | ORAL | 0 refills | Status: DC

## 2020-11-30 NOTE — ED Triage Notes (Signed)
Patient complains of nasal congestion, productive cough, and chest tightening x1 week with symptoms starting last Monday.

## 2020-11-30 NOTE — ED Provider Notes (Addendum)
[facility]    CSN: [id number] Arrival date & time: 11/30/20  1006      History   Chief Complaint Chief Complaint  Patient presents with   Cough   Nasal Congestion    HPI Selena [name] is a 85 y.o. female who presents with nose congestion, productive cough white phlegm, and chest tightness and mild wheezing x 1 week. Denies chest pains or SOB, HA, body aches, sweats, or change in appetite. Has not been around anyone sick.  She has not taken her am meds since she has not eaten yet.     Past Medical History:  Diagnosis Date   Anemia    PMH   Aneurysm (arteriovenous) of coronary vessels    Atrial fibrillation ([place]) 01/01/2007   BACK PAIN, UPPER 07/09/2008   Breast cancer, stage 1 ([place])    Bruises easily    Cataract    history   Complication of anesthesia    Diverticulitis    DIVERTICULOSIS, COLON 12/26/2006   Essential hypertension 14/04/3152   Follicular non-Hodgkin's lymphoma ([place])    Goiter    multi-nodular   Hx of colonoscopy 2009   LIVER FUNCTION TESTS, ABNORMAL, HX OF 12/26/2007   Memory loss 05/16/2007   OSTEOARTHRITIS 12/26/2006   takes Diclofenac daily but has stopped for surgery   PONV (postoperative nausea and vomiting)    Shortness of breath 05/28/2019   Skin cancer    VERTIGO 09/07/2009   Wears dentures    Wears glasses    Wears hearing aid    bilateral    Patient Active Problem List   Diagnosis Date Noted   History of COVID-19 05/14/2020   Nocturnal hypoxemia 05/14/2020   Fall at home, initial encounter 04/02/2020   Sepsis ([place]) 04/02/2020   AKI (acute kidney injury) ([place])    Leukocytosis    Chest pain 03/26/2020   COVID-19 virus infection 03/08/2020   Pneumonia due to COVID-19 virus 03/08/2020   Cough 05/28/2019   Shortness of breath 05/28/2019   Acute gastric ulcer with hemorrhage    Acute on chronic diastolic congestive heart failure ([place]) 10/16/2018   History of atrioventricular nodal ablation 10/16/2018   Thoracic aortic  aneurysm 10/16/2018   Syncope 08/21/2018   S/P ablation of atrial flutter 07/17/2018   Upper airway cough syndrome 10/27/2017   Essential hypertension 10/27/2017   DOE (dyspnea on exertion) 10/26/2017   Anxiety 09/10/2014   Insomnia 00/86/7619   Follicular 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 ([place]) 07/31/2014   Paraspinal mass 07/16/2014   Breast cancer of upper-outer quadrant of right female breast ([place]) 03/21/2011   VERTIGO 09/07/2009   Backache 07/09/2008   FATIGUE 03/18/2008   LFTs abnormal 12/26/2007   MEMORY LOSS 05/16/2007   ATRIAL FIBRILLATION 01/01/2007   DIVERTICULOSIS, COLON 12/26/2006   Osteoarthritis 12/26/2006    Past Surgical History:  Procedure Laterality Date   ABDOMINAL HYSTERECTOMY     ABLATION OF DYSRHYTHMIC FOCUS  2009   APPENDECTOMY     AUGMENTATION MAMMAPLASTY Right 2013   BIOPSY  05/07/2019   Procedure: BIOPSY;  Surgeon: Irving Copas., MD;  Location: [facility];  Service: Gastroenterology;;   BREAST BIOPSY  2013   BREAST RECONSTRUCTION  04/14/2011   Procedure: BREAST RECONSTRUCTION;  Surgeon: Crissie Reese, MD;  Location: [facility];  Service: Plastics;  Laterality: Right;  Placement of Right Breast Tissue Expander with  use of Flex HD for Breast Reconstruction   BREAST SURGERY     right sm, snbx  CATARACT EXTRACTION W/ INTRAOCULAR LENS  IMPLANT, BILATERAL  2010   bilateral   COLONOSCOPY     COLONOSCOPY     ESOPHAGOGASTRODUODENOSCOPY (EGD) WITH PROPOFOL N/A 05/07/2019   Procedure: ESOPHAGOGASTRODUODENOSCOPY (EGD) WITH PROPOFOL;  Surgeon: Rush Landmark Telford Nab., MD;  Location: [facility];  Service: Gastroenterology;  Laterality: N/A;   KNEE SURGERY  2004/2010   arthroscopic/ bilateral knee replacements   LOOP RECORDER IMPLANT     MASTECTOMY Right 2013   PORT-A-CATH REMOVAL N/A 12/07/2016   Procedure: REMOVAL PORT-A-CATH;  Surgeon: Rolm Bookbinder, MD;  Location: [facility];  Service: General;  Laterality: N/A;   PORTACATH PLACEMENT  Right 08/11/2014   Procedure: INSERTION PORT-A-CATH WITH ULTRASOUND;  Surgeon: Rolm Bookbinder, MD;  Location: [facility];  Service: General;  Laterality: Right;   [place]   right   [place]      OB History   No obstetric history on file.      Home Medications    Prior to Admission medications   Medication Sig Start Date End Date Taking? Authorizing Provider  albuterol (VENTOLIN HFA) 108 (90 Base) MCG/ACT inhaler Inhale 2 puffs into the lungs every 4 (four) hours as needed for wheezing or shortness of breath. 11/30/20  Yes Rodriguez-Southworth, Sunday Spillers, PA-C  fexofenadine (ALLEGRA ALLERGY) 180 MG tablet Take 1 tablet (180 mg total) by mouth daily. For rhinitis 11/30/20  Yes Rodriguez-Southworth, Sunday Spillers, PA-C  amLODipine (NORVASC) 5 MG tablet TAKE 1 TABLET BY MOUTH DAILY 09/11/20   Burnell Blanks, MD  benzonatate (TESSALON) 100 MG capsule Take 2 capsules (200 mg total) by mouth 3 (three) times daily for 10 days. 11/30/20 12/10/20  Rodriguez-Southworth, Sunday Spillers, PA-C  ELIQUIS 5 MG TABS tablet TAKE 1 TABLET(5 MG) BY MOUTH TWICE DAILY 10/07/20   Evans Lance, MD  famotidine (PEPCID) 20 MG tablet TAKE 1 TABLET(20 MG) BY MOUTH AT BEDTIME 11/03/20   Noralyn Pick, NP  furosemide (LASIX) 20 MG tablet TAKE 1 TABLET(20 MG) BY MOUTH DAILY 09/29/20   Burnell Blanks, MD  ipratropium (ATROVENT) 0.06 % nasal spray Place 2 sprays into both nostrils 2 (two) times daily. 10/27/20   [provider]  metoprolol tartrate (LOPRESSOR) 25 MG tablet Take 1 tablet (25 mg total) by mouth 2 (two) times daily. 10/06/20   Burnell Blanks, MD  montelukast (SINGULAIR) 10 MG tablet TAKE 1 TABLET(10 MG) BY MOUTH AT BEDTIME 11/06/20   Koberlein, Steele Berg, MD  pantoprazole (PROTONIX) 40 MG tablet TAKE 1 TABLET(40 MG) BY MOUTH TWICE DAILY 11/06/20   Noralyn Pick, NP    Family History Family History  Problem Relation Age of Onset   Colon  cancer Mother    Cancer Mother        colon   Colon cancer Maternal Aunt    Cancer Maternal Uncle    Prostate cancer Maternal Uncle    Prostate cancer Maternal Uncle    Other Maternal Uncle    Cancer Sister        kidney   Cancer Brother        lymph node   Anesthesia problems Neg Hx    Hypotension Neg Hx    Malignant hyperthermia Neg Hx    Pseudochol deficiency Neg Hx    Breast cancer Neg Hx     Social History Social History   Tobacco Use   Smoking status: Former    Packs/day: 0.10    Years: 20.00    Pack  years: 2.00    Types: Cigarettes    Quit date: 01/24/1978    Years since quitting: 42.8   Smokeless tobacco: Never  Vaping Use   Vaping Use: Never used  Substance Use Topics   Alcohol use: No   Drug use: No     Allergies   Codeine phosphate, Adhesive [tape], and Codeine   Review of Systems Review of Systems  Constitutional:  Positive for chills. Negative for activity change, appetite change, diaphoresis, fatigue and fever.  HENT:  Positive for congestion, postnasal drip and rhinorrhea. Negative for ear discharge, ear pain, sore throat and trouble swallowing.   Eyes:  Negative for discharge.  Respiratory:  Positive for cough, chest tightness and wheezing. Negative for shortness of breath.   Cardiovascular:  Negative for chest pain.  Musculoskeletal:  Negative for myalgias.  Skin:  Negative for rash.  Neurological:  Negative for headaches.  Hematological:  Negative for adenopathy.    Physical Exam Triage Vital Signs ED Triage Vitals  Enc Vitals Group     BP 11/30/20 1259 (!) 163/81     Pulse Rate 11/30/20 1259 87     Resp 11/30/20 1259 20     Temp 11/30/20 1259 97.6 F (36.4 C)     Temp Source 11/30/20 1259 Oral     SpO2 11/30/20 1259 97 %     Weight 11/30/20 1300 186 lb 1.1 oz (84.4 kg)     Height 11/30/20 1300 5\' 6"  (1.676 m)     Head Circumference --      Peak Flow --      Pain Score 11/30/20 1300 0     Pain Loc --      Pain Edu? --       Excl. in [place]? --    No data found.  Updated Vital Signs BP (!) 163/81   Pulse 87   Temp 97.6 F (36.4 C) (Oral)   Resp 20   Ht 5\' 6"  (1.676 m)   Wt 186 lb 1.1 oz (84.4 kg)   SpO2 97%   BMI 30.03 kg/m   Visual Acuity Right Eye Distance:   Left Eye Distance:   Bilateral Distance:    Right Eye Near:   Left Eye Near:    Bilateral Near:     Physical Exam Physical Exam Constitutional:      General: He is not in acute distress.    Appearance: He is not toxic-appearing.  HENT:     Head: Normocephalic.     Right Ear: Tympanic membrane, ear canal and external ear normal.     Left Ear: Ear canal and external ear normal.     Nose: Nose normal.     Mouth/Throat:     Mouth: Mucous membranes are moist.     Pharynx: Oropharynx is clear.  Eyes:     General: No scleral icterus.    Conjunctiva/sclera: Conjunctivae normal.  Cardiovascular:     Rate and Rhythm: Normal rate and regular rhythm.     Heart sounds: No murmur heard.   Pulmonary:     Effort: Pulmonary effort is normal. No respiratory distress.     Breath sounds: mild Wheezing present on L lung    Musculoskeletal:        General: Normal range of motion.     Cervical back: Neck supple.  Lymphadenopathy:     Cervical: No cervical adenopathy.  Skin:    General: Skin is warm and dry.     Findings: No rash.  Neurological:     Mental Status: He is alert and oriented to person, place, and time.     Gait: Gait normal.  Psychiatric:        Mood and Affect: Mood normal.        Behavior: Behavior normal.        Thought Content: Thought content normal.        Judgment: Judgment normal.    UC Treatments / Results  Labs (all labs ordered are listed, but only abnormal results are displayed) Labs Reviewed - No data to display  EKG   Radiology No results found.  Procedures Procedures (including critical care time)  Medications Ordered in UC Medications - No data to display  Initial Impression / Assessment and  Plan / UC Course  I have reviewed the triage vital signs and the nursing notes. URI with early viral bronchitis. I placed her on Allegra for the rhinitis, Tessalon for day and night time cough only if needed, and Albuterol inhaler. See instructions.  Final Clinical Impressions(s) / UC Diagnoses   Final diagnoses:  Viral bronchitis  Upper respiratory tract infection, unspecified type     Discharge Instructions      You do not have a bacterial infection, therefore you dont need antibiotics Only take the Cough pills if the cough is bothersome during the day, but do use the inhaler 4 times a day for 5 days, then after that only when you have wheezing If you develop a fever of 100.5 or more, chest pain, shortness of breath, please be seen again since you could develop a secondary bacterial infection.      ED Prescriptions     Medication Sig Dispense Auth. Provider   fexofenadine (ALLEGRA ALLERGY) 180 MG tablet Take 1 tablet (180 mg total) by mouth daily. For rhinitis 30 tablet Rodriguez-Southworth, Haeley [place], PA-C   benzonatate (TESSALON) 100 MG capsule Take 2 capsules (200 mg total) by mouth 3 (three) times daily for 10 days. 60 capsule Rodriguez-Southworth, Sunday Spillers, PA-C   albuterol (VENTOLIN HFA) 108 (90 Base) MCG/ACT inhaler Inhale 2 puffs into the lungs every 4 (four) hours as needed for wheezing or shortness of breath. 18 g Rodriguez-Southworth, Sunday Spillers, PA-C      PDMP not reviewed this encounter.   Shelby Mattocks, PA-C 11/30/20 1327    Rodriguez-Southworth, [place], PA-C 11/30/20 1328

## 2020-11-30 NOTE — Discharge Instructions (Signed)
You do not have a bacterial infection, therefore you dont need antibiotics Only take the Cough pills if the cough is bothersome during the day, but do use the inhaler 4 times a day for 5 days, then after that only when you have wheezing If you develop a fever of 100.5 or more, chest pain, shortness of breath, please be seen again since you could develop a secondary bacterial infection.

## 2020-12-06 ENCOUNTER — Other Ambulatory Visit: Payer: Self-pay | Admitting: Cardiovascular Disease

## 2020-12-06 DIAGNOSIS — I10 Essential (primary) hypertension: Secondary | ICD-10-CM

## 2020-12-18 ENCOUNTER — Ambulatory Visit (HOSPITAL_COMMUNITY)
Admission: EM | Admit: 2020-12-18 | Discharge: 2020-12-18 | Disposition: A | Discharge status: Home / Self Care | Payer: Medicare Other | Attending: Emergency Medicine | Admitting: Emergency Medicine

## 2020-12-18 ENCOUNTER — Ambulatory Visit (INDEPENDENT_AMBULATORY_CARE_PROVIDER_SITE_OTHER): Payer: Medicare Other

## 2020-12-18 ENCOUNTER — Other Ambulatory Visit: Payer: Self-pay

## 2020-12-18 ENCOUNTER — Encounter (HOSPITAL_COMMUNITY): Payer: Self-pay

## 2020-12-18 DIAGNOSIS — J3489 Other specified disorders of nose and nasal sinuses: Secondary | ICD-10-CM | POA: Diagnosis not present

## 2020-12-18 DIAGNOSIS — R531 Weakness: Secondary | ICD-10-CM

## 2020-12-18 DIAGNOSIS — R051 Acute cough: Secondary | ICD-10-CM

## 2020-12-18 DIAGNOSIS — J111 Influenza due to unidentified influenza virus with other respiratory manifestations: Secondary | ICD-10-CM | POA: Insufficient documentation

## 2020-12-18 DIAGNOSIS — R059 Cough, unspecified: Secondary | ICD-10-CM | POA: Diagnosis not present

## 2020-12-18 DIAGNOSIS — J069 Acute upper respiratory infection, unspecified: Secondary | ICD-10-CM | POA: Diagnosis not present

## 2020-12-18 HISTORY — DX: Influenza due to unidentified influenza virus with other respiratory manifestations: J11.1

## 2020-12-18 LAB — POC INFLUENZA A AND B ANTIGEN (URGENT CARE ONLY)
INFLUENZA A ANTIGEN, POC: POSITIVE — AB
INFLUENZA B ANTIGEN, POC: NEGATIVE

## 2020-12-18 MED ORDER — IPRATROPIUM BROMIDE 0.06 % NA SOLN
2.0000 | Freq: Four times a day (QID) | NASAL | 12 refills | Status: DC
Start: 2020-12-18 — End: 2021-05-06

## 2020-12-18 MED ORDER — METHYLPREDNISOLONE 4 MG PO TBPK: ORAL_TABLET | ORAL | 0 refills | Status: DC

## 2020-12-18 MED ORDER — PROMETHAZINE-DM 6.25-15 MG/5ML PO SYRP
5.0000 mL | ORAL_SOLUTION | Freq: Four times a day (QID) | ORAL | 0 refills | Status: AC | PRN
Start: 2020-12-18 — End: 2020-12-23

## 2020-12-18 MED ORDER — OSELTAMIVIR PHOSPHATE 75 MG PO CAPS: 75.0000 mg | ORAL_CAPSULE | Freq: Two times a day (BID) | ORAL | 0 refills | Status: DC

## 2020-12-18 NOTE — ED Triage Notes (Signed)
Pt presents with chest tightness, shortness of breath, and productive cough X 3 weeks with no relief with prescribed medication.

## 2020-12-18 NOTE — Discharge Instructions (Addendum)
Your symptoms are most consistent with a viral upper respiratory illness.  Rapid influenza testing today was positive for influenza A.  I recommend that you begin taking Tamiflu, 1 capsule twice daily for the next 5 days.  Per my personal read, your chest x-ray today is reassuring, there is no concern for pneumonia.  We are still awaiting the radiologist final report, if the report says anything different, you will to be contacted.  Provided you with a prescription for Promethazine DM to help with your cough you can take up to 4 times daily as needed.  This medication is particularly helpful for cough at nighttime as it also helps people sleep.    To help improve your work of breathing and also assist with reducing your cough, I recommend that you begin a short course of steroids to calm your upper airways, reduce bronchospasm and eliminate the urge to cough  Finally I recommend that you begin a nasal spray called Atrovent which should significantly dry up your mucous membranes, eliminate postnasal drip and also reduce your cough..  Please remain home from work, school, public places until you have been fever free for 24 hours without the use of antifever medications such as Tylenol or ibuprofen.  Conservative care is recommended at this time.  This includes rest, pushing clear fluids and activity as tolerated.  You may also noticed that your appetite is reduced, this is okay as long as they are drinking plenty of clear fluids.  Acetaminophen (Tylenol): This is a good fever reducer.  If there body temperature rises above 101.5 as measured with a thermometer, it is recommended that you give them 1,000 mg every 6-8 hours until they are temperature falls below 101.5, please not take more than 3,000 mg of acetaminophen either as a separate medication or as in ingredient in an over-the-counter cold/flu preparation within a 24-hour period  Ibuprofen  (Advil, Motrin): This is a good anti-inflammatory  medication which addresses aches and pains and, to some degree, congestion in the nasal passages.  I recommend giving between 400 to 600 mg every 6-8 hours as needed.  Pseudoephedrine (Sudafed): This is a decongestant.  This medication has to be purchased from the pharmacist counter, I recommend giving 2 tablets, 60 mg, 2-3 times a day as needed to relieve runny nose and sinus drainage.  Guaifenesin (Robitussin, Mucinex): This is an expectorant.  This helps break up chest congestion and loosen up thick nasal drainage making phlegm and drainage more liquid and therefore easier to remove.  I recommend being 400 mg three times daily as needed.  Please follow-up within the next 3 to 5 days either with your primary care provider or urgent care if your symptoms do not resolve.  If you do not have a primary care provider, we will assist you in finding one.

## 2020-12-18 NOTE — ED Provider Notes (Signed)
[facility]    CSN: [id number] Arrival date & time: 12/18/20  1355    HISTORY   Chief Complaint  Patient presents with   URI   HPI Selena [name] is a 85 y.o. female. Patient is a pleasant 85 year old female who has a past medical history significant for breast cancer, non-Hodgkin's lymphoma and bronchitis seen in urgent care on November 30, 2020.  Patient states at the time she was given prescriptions for Tessalon Perles, albuterol and Allegra, patient was not evaluated for influenza at the time.  Patient states today, the cough has become more persistent than before, none of the medication she is been prescribed are helping her, she is exhausted from coughing.  Patient states the cough is not productive of purulent sputum.  Patient states she does not have any trouble with nasal congestion, she has not been having fever.  She is does not have any nausea, vomiting, diarrhea, headache.  Chest x-ray performed today was not remarkable for any acute cardiopulmonary findings.  Patient denies known sick contacts.  The history is provided by the patient.  Past Medical History:  Diagnosis Date   Anemia    PMH   Aneurysm (arteriovenous) of coronary vessels    Atrial fibrillation ([place]) 01/01/2007   BACK PAIN, UPPER 07/09/2008   Breast cancer, stage 1 ([place])    Bruises easily    Cataract    history   Complication of anesthesia    Diverticulitis    DIVERTICULOSIS, COLON 12/26/2006   Essential hypertension 40/09/8117   Follicular non-Hodgkin's lymphoma ([place])    Goiter    multi-nodular   Hx of colonoscopy 2009   LIVER FUNCTION TESTS, ABNORMAL, HX OF 12/26/2007   Memory loss 05/16/2007   OSTEOARTHRITIS 12/26/2006   takes Diclofenac daily but has stopped for surgery   PONV (postoperative nausea and vomiting)    Shortness of breath 05/28/2019   Skin cancer    VERTIGO 09/07/2009   Wears dentures    Wears glasses    Wears hearing aid    bilateral   Patient Active Problem List    Diagnosis Date Noted   History of COVID-19 05/14/2020   Nocturnal hypoxemia 05/14/2020   Fall at home, initial encounter 04/02/2020   Sepsis ([place]) 04/02/2020   AKI (acute kidney injury) ([place])    Leukocytosis    Chest pain 03/26/2020   COVID-19 virus infection 03/08/2020   Pneumonia due to COVID-19 virus 03/08/2020   Cough 05/28/2019   Shortness of breath 05/28/2019   Acute gastric ulcer with hemorrhage    Acute on chronic diastolic congestive heart failure ([place]) 10/16/2018   History of atrioventricular nodal ablation 10/16/2018   Thoracic aortic aneurysm 10/16/2018   Syncope 08/21/2018   S/P ablation of atrial flutter 07/17/2018   Upper airway cough syndrome 10/27/2017   Essential hypertension 10/27/2017   DOE (dyspnea on exertion) 10/26/2017   Anxiety 09/10/2014   Insomnia 14/78/2956   Follicular 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 ([place]) 07/31/2014   Paraspinal mass 07/16/2014   Breast cancer of upper-outer quadrant of right female breast ([place]) 03/21/2011   VERTIGO 09/07/2009   Backache 07/09/2008   FATIGUE 03/18/2008   LFTs abnormal 12/26/2007   MEMORY LOSS 05/16/2007   ATRIAL FIBRILLATION 01/01/2007   DIVERTICULOSIS, COLON 12/26/2006   Osteoarthritis 12/26/2006   Past Surgical History:  Procedure Laterality Date   ABDOMINAL HYSTERECTOMY     ABLATION OF DYSRHYTHMIC FOCUS  2009   APPENDECTOMY     AUGMENTATION MAMMAPLASTY  Right 2013   BIOPSY  05/07/2019   Procedure: BIOPSY;  Surgeon: Irving Copas., MD;  Location: [facility];  Service: Gastroenterology;;   BREAST BIOPSY  2013   BREAST RECONSTRUCTION  04/14/2011   Procedure: BREAST RECONSTRUCTION;  Surgeon: Crissie Reese, MD;  Location: [facility];  Service: Plastics;  Laterality: Right;  Placement of Right Breast Tissue Expander with  use of Flex HD for Breast Reconstruction   BREAST SURGERY     right sm, snbx   CATARACT EXTRACTION W/ INTRAOCULAR LENS  IMPLANT, BILATERAL  2010   bilateral   COLONOSCOPY      COLONOSCOPY     ESOPHAGOGASTRODUODENOSCOPY (EGD) WITH PROPOFOL N/A 05/07/2019   Procedure: ESOPHAGOGASTRODUODENOSCOPY (EGD) WITH PROPOFOL;  Surgeon: Irving Copas., MD;  Location: [facility];  Service: Gastroenterology;  Laterality: N/A;   KNEE SURGERY  2004/2010   arthroscopic/ bilateral knee replacements   LOOP RECORDER IMPLANT     MASTECTOMY Right 2013   PORT-A-CATH REMOVAL N/A 12/07/2016   Procedure: REMOVAL PORT-A-CATH;  Surgeon: Rolm Bookbinder, MD;  Location: [facility];  Service: General;  Laterality: N/A;   PORTACATH PLACEMENT Right 08/11/2014   Procedure: INSERTION PORT-A-CATH WITH ULTRASOUND;  Surgeon: Rolm Bookbinder, MD;  Location: [facility];  Service: General;  Laterality: Right;   [place]   right   [place]     OB History   No obstetric history on file.    Home Medications    Prior to Admission medications   Medication Sig Start Date End Date Taking? Authorizing Provider  ipratropium (ATROVENT) 0.06 % nasal spray Place 2 sprays into both nostrils 4 (four) times daily. As needed for nasal congestion, runny nose 12/18/20  Yes Lynden Oxford Scales, PA-C  methylPREDNISolone (MEDROL DOSEPAK) 4 MG TBPK tablet Take 24 mg on day 1, 20 mg on day 2, 16 mg on day 3, 12 mg on day 4, 8 mg on day 5, 4 mg on day 6. 12/18/20  Yes Lynden Oxford Scales, PA-C  oseltamivir (TAMIFLU) 75 MG capsule Take 1 capsule (75 mg total) by mouth every 12 (twelve) hours. 12/18/20  Yes Lynden Oxford Scales, PA-C  promethazine-dextromethorphan (PROMETHAZINE-DM) 6.25-15 MG/5ML syrup Take 5 mLs by mouth 4 (four) times daily as needed for up to 5 days for cough. 12/18/20 12/23/20 Yes Lynden Oxford Scales, PA-C  albuterol (VENTOLIN HFA) 108 (90 Base) MCG/ACT inhaler Inhale 2 puffs into the lungs every 4 (four) hours as needed for wheezing or shortness of breath. 11/30/20   Rodriguez-Southworth, Sunday Spillers, PA-C  amLODipine (NORVASC) 5 MG tablet TAKE 1 TABLET BY  MOUTH DAILY 12/07/20   Burnell Blanks, MD  ELIQUIS 5 MG TABS tablet TAKE 1 TABLET(5 MG) BY MOUTH TWICE DAILY 10/07/20   Evans Lance, MD  famotidine (PEPCID) 20 MG tablet TAKE 1 TABLET(20 MG) BY MOUTH AT BEDTIME 11/03/20   Noralyn Pick, NP  fexofenadine (ALLEGRA ALLERGY) 180 MG tablet Take 1 tablet (180 mg total) by mouth daily. For rhinitis 11/30/20   Rodriguez-Southworth, Sunday Spillers, PA-C  furosemide (LASIX) 20 MG tablet TAKE 1 TABLET(20 MG) BY MOUTH DAILY 09/29/20   Burnell Blanks, MD  metoprolol tartrate (LOPRESSOR) 25 MG tablet Take 1 tablet (25 mg total) by mouth 2 (two) times daily. 10/06/20   Burnell Blanks, MD  montelukast (SINGULAIR) 10 MG tablet TAKE 1 TABLET(10 MG) BY MOUTH AT BEDTIME 11/06/20   Koberlein, Junell C, MD  pantoprazole (PROTONIX) 40 MG tablet TAKE 1 TABLET(40  MG) BY MOUTH TWICE DAILY 11/06/20   Noralyn Pick, NP   Family History Family History  Problem Relation Age of Onset   Colon cancer Mother    Cancer Mother        colon   Colon cancer Maternal Aunt    Cancer Maternal Uncle    Prostate cancer Maternal Uncle    Prostate cancer Maternal Uncle    Other Maternal Uncle    Cancer Sister        kidney   Cancer Brother        lymph node   Anesthesia problems Neg Hx    Hypotension Neg Hx    Malignant hyperthermia Neg Hx    Pseudochol deficiency Neg Hx    Breast cancer Neg Hx    Social History Social History   Tobacco Use   Smoking status: Former    Packs/day: 0.10    Years: 20.00    Pack years: 2.00    Types: Cigarettes    Quit date: 01/24/1978    Years since quitting: 42.9   Smokeless tobacco: Never  Vaping Use   Vaping Use: Never used  Substance Use Topics   Alcohol use: No   Drug use: No   Allergies   Codeine phosphate, Adhesive [tape], and Codeine  Review of Systems Review of Systems Pertinent findings noted in history of present illness.   Physical Exam Triage Vital Signs ED Triage Vitals   Enc Vitals Group     BP 11/20/20 0827 (!) 147/82     Pulse Rate 11/20/20 0827 72     Resp 11/20/20 0827 18     Temp 11/20/20 0827 98.3 F (36.8 C)     Temp Source 11/20/20 0827 Oral     SpO2 11/20/20 0827 98 %     Weight --      Height --      Head Circumference --      Peak Flow --      Pain Score 11/20/20 0826 5     Pain Loc --      Pain Edu? --      Excl. in [place]? --   No data found.  Updated Vital Signs BP 135/81 (BP Location: Left Arm)   Pulse 85   Temp 98.2 F (36.8 C) (Oral)   Resp 17   SpO2 95%   Physical Exam Vitals and nursing note reviewed.  Constitutional:      General: She is not in acute distress.    Appearance: Normal appearance. She is not ill-appearing.  HENT:     Head: Normocephalic and atraumatic.     Salivary Glands: Right salivary gland is not diffusely enlarged or tender. Left salivary gland is not diffusely enlarged or tender.     Right Ear: Tympanic membrane, ear canal and external ear normal. No drainage. No middle ear effusion. There is no impacted cerumen. Tympanic membrane is not erythematous or bulging.     Left Ear: Tympanic membrane, ear canal and external ear normal. No drainage.  No middle ear effusion. There is no impacted cerumen. Tympanic membrane is not erythematous or bulging.     Nose: Nose normal. No nasal deformity, septal deviation, mucosal edema, congestion or rhinorrhea.     Right Turbinates: Not enlarged, swollen or pale.     Left Turbinates: Not enlarged, swollen or pale.     Right Sinus: No maxillary sinus tenderness or frontal sinus tenderness.     Left Sinus: No maxillary sinus tenderness  or frontal sinus tenderness.     Mouth/Throat:     Lips: Pink. No lesions.     Mouth: Mucous membranes are moist. No oral lesions.     Pharynx: Oropharynx is clear. Uvula midline. No posterior oropharyngeal erythema or uvula swelling.     Tonsils: No tonsillar exudate. 0 on the right. 0 on the left.  Eyes:     General: Lids are normal.         Right eye: No discharge.        Left eye: No discharge.     Extraocular Movements: Extraocular movements intact.     Conjunctiva/sclera: Conjunctivae normal.     Right eye: Right conjunctiva is not injected.     Left eye: Left conjunctiva is not injected.  Neck:     Trachea: Trachea and phonation normal.  Cardiovascular:     Rate and Rhythm: Normal rate and regular rhythm.     Pulses: Normal pulses.     Heart sounds: Normal heart sounds. No murmur heard.   No friction rub. No gallop.  Pulmonary:     Effort: Pulmonary effort is normal. No accessory muscle usage, prolonged expiration or respiratory distress.     Breath sounds: Normal breath sounds. No stridor, decreased air movement or transmitted upper airway sounds. No decreased breath sounds, wheezing, rhonchi or rales.     Comments: Breath sounds turbulent at both lung bases Chest:     Chest wall: No tenderness.  Musculoskeletal:        General: Normal range of motion.     Cervical back: Normal range of motion and neck supple. Normal range of motion.  Lymphadenopathy:     Cervical: Cervical adenopathy present.     Right cervical: Superficial cervical adenopathy, deep cervical adenopathy and posterior cervical adenopathy present.     Left cervical: Superficial cervical adenopathy, deep cervical adenopathy and posterior cervical adenopathy present.  Skin:    General: Skin is warm and dry.     Findings: No erythema or rash.  Neurological:     General: No focal deficit present.     Mental Status: She is alert and oriented to person, place, and time.  Psychiatric:        Mood and Affect: Mood normal.        Behavior: Behavior normal.    Visual Acuity Right Eye Distance:   Left Eye Distance:   Bilateral Distance:    Right Eye Near:   Left Eye Near:    Bilateral Near:     UC Couse / Diagnostics / Procedures:    EKG  Radiology DG Chest 2 View  Result Date: 12/18/2020 CLINICAL DATA:  Cough congestion weakness  EXAM: CHEST - 2 VIEW COMPARISON:  05/04/2020, CT 03/26/2020, radiograph 05/28/2019 FINDINGS: Recording device over the left chest. Partially visualized hardware at the mandible. Mild diffuse coarse interstitial opacity consistent with chronic lung disease. No acute confluent airspace disease, pleural effusion or pneumothorax. Stable cardiomediastinal silhouette. IMPRESSION: Chronic appearing interstitial lung opacities without acute superimposed confluent airspace disease. Electronically Signed   By: Donavan Foil M.D.   On: 12/18/2020 16:10    Procedures Procedures (including critical care time)  UC Diagnoses / Final Clinical Impressions(s)    Final diagnoses:  Acute cough  Rhinorrhea  Upper respiratory tract infection, unspecified type   I have reviewed the triage vital signs and the nursing notes.  Pertinent labs & imaging results that were available during my care of the patient were reviewed by me  and considered in my medical decision making (see chart for details).    Patient presents today with symptoms consistent with viral upper respiratory infection.  Rapid influenza test today was positive for influenza A.  Chest x-ray was nonconcerning for pneumonia.  Patient was provided with a prescription for Tamiflu and will be treated symptomatically with promethazine cough syrup, Medrol dose pack and ipratropium nasal spray.  Return precautions advised.  Patient/parent/caregiver verbalized understanding and agreement of plan as discussed.  All questions were addressed during visit.  Please see discharge instructions below for further details of plan.  ED Prescriptions     Medication Sig Dispense Auth. Provider   oseltamivir (TAMIFLU) 75 MG capsule Take 1 capsule (75 mg total) by mouth every 12 (twelve) hours. 10 capsule Lynden Oxford Scales, PA-C   ipratropium (ATROVENT) 0.06 % nasal spray Place 2 sprays into both nostrils 4 (four) times daily. As needed for nasal congestion, runny nose  15 mL Lynden Oxford Scales, PA-C   methylPREDNISolone (MEDROL DOSEPAK) 4 MG TBPK tablet Take 24 mg on day 1, 20 mg on day 2, 16 mg on day 3, 12 mg on day 4, 8 mg on day 5, 4 mg on day 6. 21 tablet Lynden Oxford Scales, PA-C   promethazine-dextromethorphan (PROMETHAZINE-DM) 6.25-15 MG/5ML syrup Take 5 mLs by mouth 4 (four) times daily as needed for up to 5 days for cough. 118 mL Lynden Oxford Scales, PA-C      PDMP not reviewed this encounter.  Pending results:  Labs Reviewed  POC INFLUENZA A AND B ANTIGEN (URGENT CARE ONLY) - Abnormal; Notable for the following components:      Result Value   INFLUENZA A ANTIGEN, POC POSITIVE (*)    All other components within normal limits     Medications Ordered in UC: Medications - No data to display  Discharge Instructions:   Discharge Instructions      Your symptoms are most consistent with a viral upper respiratory illness.  Rapid influenza testing today was positive for influenza A.  I recommend that you begin taking Tamiflu, 1 capsule twice daily for the next 5 days.  Per my personal read, your chest x-ray today is reassuring, there is no concern for pneumonia.  We are still awaiting the radiologist final report, if the report says anything different, you will to be contacted.  Provided you with a prescription for Promethazine DM to help with your cough you can take up to 4 times daily as needed.  This medication is particularly helpful for cough at nighttime as it also helps people sleep.    To help improve your work of breathing and also assist with reducing your cough, I recommend that you begin a short course of steroids to calm your upper airways, reduce bronchospasm and eliminate the urge to cough  Finally I recommend that you begin a nasal spray called Atrovent which should significantly dry up your mucous membranes, eliminate postnasal drip and also reduce your cough..  Please remain home from work, school, public places  until you have been fever free for 24 hours without the use of antifever medications such as Tylenol or ibuprofen.  Conservative care is recommended at this time.  This includes rest, pushing clear fluids and activity as tolerated.  You may also noticed that your appetite is reduced, this is okay as long as they are drinking plenty of clear fluids.  Acetaminophen (Tylenol): This is a good fever reducer.  If there body temperature rises above 101.5  as measured with a thermometer, it is recommended that you give them 1,000 mg every 6-8 hours until they are temperature falls below 101.5, please not take more than 3,000 mg of acetaminophen either as a separate medication or as in ingredient in an over-the-counter cold/flu preparation within a 24-hour period  Ibuprofen  (Advil, Motrin): This is a good anti-inflammatory medication which addresses aches and pains and, to some degree, congestion in the nasal passages.  I recommend giving between 400 to 600 mg every 6-8 hours as needed.  Pseudoephedrine (Sudafed): This is a decongestant.  This medication has to be purchased from the pharmacist counter, I recommend giving 2 tablets, 60 mg, 2-3 times a day as needed to relieve runny nose and sinus drainage.  Guaifenesin (Robitussin, Mucinex): This is an expectorant.  This helps break up chest congestion and loosen up thick nasal drainage making phlegm and drainage more liquid and therefore easier to remove.  I recommend being 400 mg three times daily as needed.  Please follow-up within the next 3 to 5 days either with your primary care provider or urgent care if your symptoms do not resolve.  If you do not have a primary care provider, we will assist you in finding one.       Disposition Upon Discharge:  Patient presented with an acute illness with associated systemic symptoms and significant discomfort requiring urgent management. In my opinion, this is a condition that a prudent lay person (someone who  possesses an average knowledge of health and medicine) may potentially expect to result in complications if not addressed urgently such as respiratory distress, impairment of bodily function or dysfunction of bodily organs.   Routine symptom specific, illness specific and/or disease specific instructions were discussed with the patient and/or caregiver at length.   As such, the patient has been evaluated and assessed, work-up was performed and treatment was provided in alignment with urgent care protocols and evidence based medicine.  Patient/parent/caregiver has been advised that the patient may require follow up for further testing and treatment if the symptoms continue in spite of treatment, as clinically indicated and appropriate.  The patient was tested for COVID-19, Influenza and/or RSV, then the patient/parent/guardian was advised to isolate at home pending the results of his/her diagnostic coronavirus test and potentially longer if they're positive. I have also advised pt that if his/her COVID-19 test returns positive, it's recommended to self-isolate for at least 10 days after symptoms first appeared AND until fever-free for 24 hours without fever reducer AND other symptoms have improved or resolved. Discussed self-isolation recommendations as well as instructions for household member/close contacts as per the [facility] and [place] DHHS, and also gave patient the [facility] packet with this information.  Patient/parent/caregiver has been advised to return to the [facility] or PCP in 3-5 days if no better; to PCP or the Emergency Department if new signs and symptoms develop, or if the current signs or symptoms continue to change or worsen for further workup, evaluation and treatment as clinically indicated and appropriate  The patient will follow up with their current PCP if and as advised. If the patient does not currently have a PCP we will assist them in obtaining one.   The patient may need specialty follow up if  the symptoms continue, in spite of conservative treatment and management, for further workup, evaluation, consultation and treatment as clinically indicated and appropriate.  Condition: stable for discharge home Home: take medications as prescribed; routine discharge instructions as discussed; follow up  as advised.    Lynden Oxford Scales, PA-C 12/18/20 1615

## 2020-12-28 ENCOUNTER — Ambulatory Visit: Payer: Medicare Other | Admitting: Family Medicine

## 2020-12-28 VITALS — BP 112/72 | HR 82 | Temp 97.8°F | Wt 178.4 lb

## 2020-12-28 DIAGNOSIS — H6642 Suppurative otitis media, unspecified, left ear: Secondary | ICD-10-CM | POA: Diagnosis not present

## 2020-12-28 DIAGNOSIS — R195 Other fecal abnormalities: Secondary | ICD-10-CM | POA: Diagnosis not present

## 2020-12-28 DIAGNOSIS — R0982 Postnasal drip: Secondary | ICD-10-CM

## 2020-12-28 DIAGNOSIS — R052 Subacute cough: Secondary | ICD-10-CM

## 2020-12-28 DIAGNOSIS — J322 Chronic ethmoidal sinusitis: Secondary | ICD-10-CM | POA: Diagnosis not present

## 2020-12-28 DIAGNOSIS — K649 Unspecified hemorrhoids: Secondary | ICD-10-CM

## 2020-12-28 MED ORDER — AMOXICILLIN-POT CLAVULANATE 500-125 MG PO TABS: 1.0000 | ORAL_TABLET | Freq: Two times a day (BID) | ORAL | 0 refills | Status: AC

## 2020-12-28 MED ORDER — HYDROCORTISONE ACETATE 25 MG RE SUPP
25.0000 mg | Freq: Two times a day (BID) | RECTAL | 0 refills | Status: DC
Start: 2020-12-28 — End: 2021-01-07

## 2020-12-28 NOTE — Patient Instructions (Addendum)
You can use a probiotic (the good bacteria found in yogurt) to help with your loose stools or Imodium.  Both can be found over-the-counter at a local drugstore.  You can also try eating yogurt which has good bacteria and it.  You can use over-the-counter Delsym for your cough.

## 2020-12-28 NOTE — Progress Notes (Signed)
Subjective:    Patient ID: Selena [name], female    DOB: 1935/09/25, 85 y.o.   MRN: [id number]  Chief Complaint  Patient presents with   Cough    Congestion, cough w/ mucus and diarrhea for going on 3 weeks.  Has taken ipratropium, tessalon and the albuterol for ED on 11/07. Premethazine, oseltamivir, and methylprednisone from ED on 11/25, has taken all of those and still has the cough, states head is congested.    HPI Patient is an 85 year old female with pmh sig for h/o breast cancer, afib, h/o aneurysm, OSA on CPAP, HTN, who was seen today for acute concern.  Patient seen for cough and diarrhea times several weeks.  Denies fever, chills, and sore throat.  Patient notes symptoms started after Thanksgiving.   Seen in ED 11/7, given albuterol inhaler, Tessalon, and Allegra.  Patient endorses productive cough, feeling weak, achy, loose stools.  Seen at [facility] 12/18/20, given prednisone taper, Tamiflu, promethazine-DM.  Advised symptoms related to the flu.  Chest x-ray with chronic lung opacities.    Past Medical History:  Diagnosis Date   Anemia    PMH   Aneurysm (arteriovenous) of coronary vessels    Atrial fibrillation ([place]) 01/01/2007   BACK PAIN, UPPER 07/09/2008   Breast cancer, stage 1 ([place])    Bruises easily    Cataract    history   Complication of anesthesia    Diverticulitis    DIVERTICULOSIS, COLON 12/26/2006   Essential hypertension 61/04/4313   Follicular non-Hodgkin's lymphoma ([place])    Goiter    multi-nodular   Hx of colonoscopy 2009   LIVER FUNCTION TESTS, ABNORMAL, HX OF 12/26/2007   Memory loss 05/16/2007   OSTEOARTHRITIS 12/26/2006   takes Diclofenac daily but has stopped for surgery   PONV (postoperative nausea and vomiting)    Shortness of breath 05/28/2019   Skin cancer    VERTIGO 09/07/2009   Wears dentures    Wears glasses    Wears hearing aid    bilateral    Allergies  Allergen Reactions   Codeine Phosphate Other (See Comments)    Hallucinations    Adhesive [Tape]     Electrodes for EKG- skin blistering, erythema   Codeine Other (See Comments)    Hallucinations     ROS General: Denies fever, chills, night sweats, changes in weight, changes in appetite HEENT: Denies headaches, ear pain, changes in vision, rhinorrhea, sore throat CV: Denies CP, palpitations, SOB, orthopnea Pulm: Denies SOB,  wheezing  + cough GI: Denies abdominal pain, nausea, vomiting, diarrhea, constipation + loose stools, rectal discomfort GU: Denies dysuria, hematuria, frequency, vaginal discharge Msk: Denies muscle cramps, joint pains Neuro: Denies weakness, numbness, tingling Skin: Denies rashes, bruising Psych: Denies depression, anxiety, hallucinations     Objective:    Blood pressure 112/72, pulse 82, temperature 97.8 F (36.6 C), temperature source Oral, weight 178 lb 6.4 oz (80.9 kg), SpO2 95 %.  Gen. Pleasant, well-nourished, in no distress, normal affect   HEENT: [place]/AT, face symmetric, conjunctiva clear, no scleral icterus, PERRLA, EOMI, nares patent without drainage, pharynx without erythema or exudate.  Bilateral cervical lymphadenopathy. left TM with suppurative fluid and mild erythema.  Right TM full Neck: No JVD, no thyromegaly, no carotid bruits Lungs: Intermittent cough no accessory muscle use, CTAB, no wheezes or rales Cardiovascular: RRR, no m/r/g, no peripheral edema Musculoskeletal: No deformities, no cyanosis or clubbing, normal tone Neuro:  A&Ox3, CN II-XII intact, normal gait Skin:  Warm, no lesions/ rash  Wt Readings from Last 3 Encounters:  12/28/20 178 lb 6.4 oz (80.9 kg)  11/30/20 186 lb 1.1 oz (84.4 kg)  11/03/20 186 lb (84.4 kg)    Lab Results  Component Value Date   WBC 9.2 11/03/2020   HGB 14.3 11/03/2020   HCT 44.1 11/03/2020   PLT 187.0 11/03/2020   GLUCOSE 81 11/03/2020   CHOL 154 02/19/2020   TRIG 93 03/08/2020   HDL 43.60 02/19/2020   LDLDIRECT 98.0 02/19/2020   LDLCALC 105 (H) 08/28/2017   ALT 15  11/03/2020   AST 19 11/03/2020   NA 142 11/03/2020   K 4.0 11/03/2020   CL 104 11/03/2020   CREATININE 0.68 11/03/2020   BUN 16 11/03/2020   CO2 31 11/03/2020   TSH 0.56 08/19/2020   INR 1.1 05/06/2019    Assessment/Plan:  Ethmoid sinusitis, unspecified chronicity  - Plan: amoxicillin-clavulanate (AUGMENTIN) 500-125 MG tablet  Suppurative otitis media of left ear, unspecified chronicity  - Plan: amoxicillin-clavulanate (AUGMENTIN) 500-125 MG tablet  Loose stools -Discussed supportive care including Imodium and probiotics -Plan foods advance diet as tolerated -Given handout  Post-nasal drainage -OTC antihistamines. -Also consider nasal saline rinse or Flonase  Subacute cough -Likely 2/2 postnasal drainage -Supportive care -Advised to use OTC Delsym  Hemorrhoids, unspecified hemorrhoid type -2/2 frequent loose stools - Plan: hydrocortisone (ANUSOL-HC) 25 MG suppository  F/u with PCP as needed  Grier Mitts, MD

## 2020-12-31 ENCOUNTER — Other Ambulatory Visit: Payer: Self-pay | Admitting: Cardiovascular Disease

## 2021-01-07 ENCOUNTER — Other Ambulatory Visit: Payer: Self-pay

## 2021-01-07 ENCOUNTER — Ambulatory Visit: Payer: Medicare Other | Admitting: Family Medicine

## 2021-01-07 ENCOUNTER — Ambulatory Visit (INDEPENDENT_AMBULATORY_CARE_PROVIDER_SITE_OTHER): Payer: Medicare Other

## 2021-01-07 ENCOUNTER — Ambulatory Visit
Admission: RE | Admit: 2021-01-07 | Discharge: 2021-01-07 | Disposition: A | Discharge status: Home / Self Care | Payer: Medicare Other | Source: Ambulatory Visit | Attending: Family Medicine | Admitting: Family Medicine

## 2021-01-07 VITALS — BP 129/77 | HR 78 | Ht 66.0 in | Wt 180.0 lb

## 2021-01-07 DIAGNOSIS — J310 Chronic rhinitis: Secondary | ICD-10-CM | POA: Insufficient documentation

## 2021-01-07 DIAGNOSIS — H903 Sensorineural hearing loss, bilateral: Secondary | ICD-10-CM

## 2021-01-07 DIAGNOSIS — R413 Other amnesia: Secondary | ICD-10-CM | POA: Diagnosis not present

## 2021-01-07 DIAGNOSIS — J300 Vasomotor rhinitis: Secondary | ICD-10-CM

## 2021-01-07 DIAGNOSIS — G4733 Obstructive sleep apnea (adult) (pediatric): Secondary | ICD-10-CM | POA: Insufficient documentation

## 2021-01-07 DIAGNOSIS — H547 Unspecified visual loss: Secondary | ICD-10-CM | POA: Insufficient documentation

## 2021-01-07 DIAGNOSIS — C8203 Follicular lymphoma grade I, intra-abdominal lymph nodes: Secondary | ICD-10-CM

## 2021-01-07 DIAGNOSIS — M25472 Effusion, left ankle: Secondary | ICD-10-CM

## 2021-01-07 DIAGNOSIS — R052 Subacute cough: Secondary | ICD-10-CM

## 2021-01-07 DIAGNOSIS — J849 Interstitial pulmonary disease, unspecified: Secondary | ICD-10-CM | POA: Insufficient documentation

## 2021-01-07 DIAGNOSIS — K08109 Complete loss of teeth, unspecified cause, unspecified class: Secondary | ICD-10-CM

## 2021-01-07 DIAGNOSIS — Z7189 Other specified counseling: Secondary | ICD-10-CM

## 2021-01-07 DIAGNOSIS — Z23 Encounter for immunization: Secondary | ICD-10-CM | POA: Diagnosis not present

## 2021-01-07 DIAGNOSIS — R053 Chronic cough: Secondary | ICD-10-CM

## 2021-01-07 DIAGNOSIS — E042 Nontoxic multinodular goiter: Secondary | ICD-10-CM | POA: Insufficient documentation

## 2021-01-07 DIAGNOSIS — M7502 Adhesive capsulitis of left shoulder: Secondary | ICD-10-CM

## 2021-01-07 DIAGNOSIS — R296 Repeated falls: Secondary | ICD-10-CM

## 2021-01-07 DIAGNOSIS — Z972 Presence of dental prosthetic device (complete) (partial): Secondary | ICD-10-CM

## 2021-01-07 DIAGNOSIS — M25471 Effusion, right ankle: Secondary | ICD-10-CM

## 2021-01-07 HISTORY — DX: Sensorineural hearing loss, bilateral: H90.3

## 2021-01-07 HISTORY — DX: Unspecified visual loss: H54.7

## 2021-01-07 HISTORY — DX: Chronic rhinitis: J31.0

## 2021-01-07 MED ORDER — AZELASTINE HCL 0.1 % NA SOLN: 2.0000 | Freq: Two times a day (BID) | NASAL | 12 refills | Status: DC

## 2021-01-07 NOTE — Patient Instructions (Signed)
I believe some or all of your coughing is from runny nose fluid going down the back of your throat (Post Nasal Drip)  We need to dry up your nose.  To do this please keep taking the ipratropium nasal spray.  Add a new nasal spray called Atselin.  If after 3 to 4 days you are not seeing any improvement, take the over the counter pill call Chlorpheniramine (Chlor Trimiton), 4 mg once.  Make sure it does not make you too drowsy or causes you to have problems with your thinking.  Let your family know if you take one of these tablets.    If the chlorpheniramine, is helpful, you may take it up to 3 times a day.  But, again, watch for drowsiness and confusion.    Let Dr Anaaya Fuster, know in about a week if your cough is getting better or not.  I did hear some crackles down in the left base of your lungs.  It would be good to get a Chest X-ray to see what is causing this.  It could be related to your cough.    We will repeat your memory testing when you are feeling better.   Postnasal Drip Postnasal drip is the feeling of mucus going down the back of your throat. Mucus is a slimy substance that moistens and cleans your nose and throat, as well as the air pockets in face bones near your forehead and cheeks (sinuses). Small amounts of mucus pass from your nose and sinuses down the back of your throat all the time. This is normal. When you produce too much mucus or the mucus gets too thick, you can feel it. Some common causes of postnasal drip include: Having more mucus because of: A cold or the flu. Allergies. Cold air. Certain medicines. Having more mucus that is thicker because of: A sinus or nasal infection. Dry air. A food allergy. Follow these instructions at home: Relieving discomfort  Gargle with a salt-water mixture 3-4 times a day or as needed. To make a salt-water mixture, completely dissolve -1 tsp of salt in 1 cup of warm water. If the air in your home is dry, use a humidifier to add  moisture to the air. Use a saline spray or container (neti pot) to flush out the nose (nasal irrigation). These methods can help clear away mucus and keep the nasal passages moist. General instructions Take over-the-counter and prescription medicines only as told by your health care provider. Follow instructions from your health care provider about eating or drinking restrictions. You may need to avoid caffeine. Avoid things that you know you are allergic to (allergens), like dust, mold, pollen, pets, or certain foods. Drink enough fluid to keep your urine pale yellow. Keep all follow-up visits as told by your health care provider. This is important. Contact a health care provider if: You have a fever. You have a sore throat. You have difficulty swallowing. You have headache. You have sinus pain. You have a cough that does not go away. The mucus from your nose becomes thick and is green or yellow in color. You have cold or flu symptoms that last more than 10 days. Summary Postnasal drip is the feeling of mucus going down the back of your throat. If your health care provider approves, use nasal irrigation or a nasal spray 2?4 times a day. Avoid things that you know you are allergic to (allergens), like dust, mold, pollen, pets, or certain foods. This information is not  intended to replace advice given to you by your health care provider. Make sure you discuss any questions you have with your health care provider. Document Revised: 10/22/2019 Document Reviewed: 10/22/2019 Elsevier Patient Education  2022 [employer].

## 2021-01-07 NOTE — Progress Notes (Signed)
Provider:  Lissa Morales, MD Location:   [place]    PCP: , Blane Ohara, MD Patient Care Team: Caren Macadam, MD as PCP - General (Family Medicine) Burnell Blanks, MD as PCP - Cardiology (Cardiology)  Extended Emergency Contact Information Primary Emergency Contact: Mahkayla, Preece Mobile Phone: [phone] Relation: Son Secondary Emergency Contact: Joana Reamer Address: [address]          [place], [place] [address] [place] Mobile Phone: [phone] Relation: Daughter  Code Status: DNR Goals of Care: Advanced Directive information Advanced Directives 01/07/2021  Does Patient Have a Medical Advance Directive? No  Type of Advance Directive Living will  Does patient want to make changes to medical advance directive? -  Copy of [place] in Chart? No - copy requested  Would patient like information on creating a medical advance directive? No - Patient declined  Pre-existing out of facility DNR order (yellow form or pink MOST form) -     [facility]:   Patient is accompanied by  daughter in law, Paisyn Guercio Primary caregiver:  self-care, though her son and daughter live in houses on both sides of her. Patient's Currently living arrangement:  patient lives alone Patient information was obtained from ER records, historical medical records, lab reports, office notes, and imaging reports and review of CXRs   Patient and Jahnae Mcadoo were both sources of information History/Exam limitations:  The patient not feeling well Referring provider:  Self-referral.  I was the PCP for [place] father.  Reason for referral:  Initially it was for a memory complaint, but cough is most pressing issue so it took up much of the  visit  ----------------------------------------------------------------------------------------------------------------------------------------------------------------------------------------------------------------------------------------------------------------   HPI by problems:  Chief Complaint  Patient presents with   Memory Loss   Cough    Cognitive impairment concern  Are there problems with thinking?  Cognition domains: Memory difficulties and Orientation to location difficulty  When were the changes first noticed?  About a year ago  Did this change occur abruptly or gradually?  gradual  How have the changes progressed since then?  gradually worsening  Has there been any tremors or abnormal movements?  no  Have they had in hallucinations or delusions:  no  Have they appeared more anxious or sad lately?  yes  Do they still have interests or activities they enjor doing?  yes  How has their appetite been lately?  show no change     Compared to 5 to 10 years ago, how is the patient at:  Problems with Judgment, e.g., problem making decisions, bad financial decisions, problems with thinking?  no  Less interested in hobbies or previously enjoyed activities?  no  Problem remembering things about family and friends e.g. names,  occupations, birthdays, addresses?  yes, but not a change from her usual  Problem remembering conversations or news events a few days later?  no  Problem remembering what day and month it is? no  Problem with losing things?  no  Problem handling financial matters, e.g. their pension, checking, credit cards, dealing with the bank?  no  Problem with getting lost in familiar places?  no  PHQ-9: [place] Visit from 01/07/2021 in [facility]  PHQ-9 Total Score 6        Outpatient Encounter Medications as of 01/07/2021  Medication Sig   albuterol (VENTOLIN HFA) 108 (90 Base) MCG/ACT  inhaler Inhale 2  puffs into the lungs every 4 (four) hours as needed for wheezing or shortness of breath.   amLODipine (NORVASC) 5 MG tablet TAKE 1 TABLET BY MOUTH DAILY   ELIQUIS 5 MG TABS tablet TAKE 1 TABLET(5 MG) BY MOUTH TWICE DAILY   famotidine (PEPCID) 20 MG tablet TAKE 1 TABLET(20 MG) BY MOUTH AT BEDTIME   fexofenadine (ALLEGRA ALLERGY) 180 MG tablet Take 1 tablet (180 mg total) by mouth daily. For rhinitis   furosemide (LASIX) 20 MG tablet TAKE 1 TABLET(20 MG) BY MOUTH DAILY   ipratropium (ATROVENT) 0.06 % nasal spray Place 2 sprays into both nostrils 4 (four) times daily. As needed for nasal congestion, runny nose   metoprolol tartrate (LOPRESSOR) 25 MG tablet Take 1 tablet (25 mg total) by mouth 2 (two) times daily.   montelukast (SINGULAIR) 10 MG tablet TAKE 1 TABLET(10 MG) BY MOUTH AT BEDTIME   pantoprazole (PROTONIX) 40 MG tablet CBD Oil TAKE 1 TABLET(40 MG) BY MOUTH TWICE DAILY     History Patient Active Problem List   Diagnosis Date Noted   Subacute cough 05/28/2019    Priority: High   Follicular 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 ([name]) 07/31/2014    Priority: High   Atrial fibrillation, permanent ([place]) 01/01/2007    Priority: High   Macular degeneration, bilateral 01/08/2021    Priority: Medium    OSA on CPAP 01/07/2021    Priority: Medium    Visual impairment 01/07/2021    Priority: Medium    Multinodular thyroid recommended annual [place] surveillance 01/07/2021    Priority: Medium    Essential hypertension 10/27/2017    Priority: Medium    Memory loss 05/16/2007    Priority: Medium    Ankle edema, bilateral 01/08/2021    Priority: Low   Sensorineural hearing loss (SNHL) of both ears 01/07/2021    Priority: Low   Falls frequently 04/02/2020    Priority: Low   Frozen shoulder, Left 11/11/2017    Priority: Low   Osteoarthritis 12/26/2006    Priority: Low   Full dentures 01/08/2021   Interstitial lung changes on CXR    History of atrioventricular  nodal ablation 10/16/2018   S/P ablation of atrial flutter 07/17/2018   Insomnia 09/10/2014   Breast cancer of upper-outer quadrant of right female breast ([place]) 03/21/2011   Past Medical History:  Diagnosis Date   Acute gastric ulcer with hemorrhage    Acute on chronic diastolic congestive heart failure ([place]) 10/16/2018   AKI (acute kidney injury) ([place])    Anemia    PMH   Aneurysm (arteriovenous) of coronary vessels    Ankle edema, bilateral 01/08/2021   Atrial fibrillation ([place]) 01/01/2007   BACK PAIN, UPPER 07/09/2008   Breast cancer of upper-outer quadrant of right female breast ([place]) 03/21/2011   Breast cancer, stage 1 ([place])    Bruises easily    Cataract    history   Complication of anesthesia    COVID-19 virus infection 03/08/2020   Diverticulitis    DIVERTICULOSIS, COLON 12/26/2006   Essential hypertension 70/62/3762   Follicular 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 ([place]) 83/15/1761   Follicular non-Hodgkin's lymphoma ([name])    Frozen shoulder, Left 01/08/2021   Full dentures 01/08/2021   Goiter    multi-nodular   Gustatory rhinitis 01/07/2021   History of atrioventricular nodal ablation 10/16/2018   Hx of colonoscopy 2009   Influenza 12/18/2020   Insomnia 09/10/2014   LIVER FUNCTION TESTS, ABNORMAL, HX OF 12/26/2007   Macular  degeneration, bilateral 01/08/2021   Memory loss 05/16/2007   Nocturnal hypoxemia 05/14/2020   OSTEOARTHRITIS 12/26/2006   takes Diclofenac daily but has stopped for surgery   Osteoarthritis 12/26/2006   Qualifier: Diagnosis of  By: Paulina Fusi, RN, Daine Gravel    Paraspinal mass 07/16/2014   Pneumonia 11/11/2017   Pneumonia 01/31/2018   Pneumonia due to COVID-19 virus 03/08/2020   PONV (postoperative nausea and vomiting)    S/P ablation of atrial flutter 07/17/2018   Sensorineural hearing loss (SNHL) of both ears 01/07/2021   Sepsis ([place]) 04/02/2020   Shortness of breath 05/28/2019   Skin cancer    Syncope 08/21/2018   Thoracic  aortic aneurysm 10/16/2018   Tibialis posterior tendinopathy 04/08/2019   Upper airway cough syndrome 10/27/2017   pfts 10/10/17 s sign airflow obst though the insp loop is somewhat flattened  - Allergy profile 10/26/2017 >  Eos 0.3 /  IgE  6 RAST neg - rx for gerd 10/26/17 > improved 12/07/2017    VERTIGO 09/07/2009   Has had Vestibular Rehab consultation   Visual impairment 01/07/2021   Wears dentures    Wears glasses    Wears hearing aid    bilateral   Past Surgical History:  Procedure Laterality Date   ABDOMINAL HYSTERECTOMY     ABLATION OF DYSRHYTHMIC FOCUS  2009   APPENDECTOMY     AUGMENTATION MAMMAPLASTY Right 2013   Subpectoral implant   BIOPSY  05/07/2019   Procedure: BIOPSY;  Surgeon: Irving Copas., MD;  Location: [facility];  Service: Gastroenterology;;   BREAST BIOPSY  2013   BREAST RECONSTRUCTION  04/14/2011   Procedure: BREAST RECONSTRUCTION;  Surgeon: Crissie Reese, MD;  Location: [facility];  Service: Plastics;  Laterality: Right;  Placement of Right Breast Tissue Expander with  use of Flex HD for Breast Reconstruction   BREAST SURGERY     right sm, snbx   CATARACT EXTRACTION W/ INTRAOCULAR LENS  IMPLANT, BILATERAL  2010   bilateral   COLONOSCOPY     COLONOSCOPY     ESOPHAGOGASTRODUODENOSCOPY (EGD) WITH PROPOFOL N/A 05/07/2019   Procedure: ESOPHAGOGASTRODUODENOSCOPY (EGD) WITH PROPOFOL;  Surgeon: Irving Copas., MD;  Location: [facility];  Service: Gastroenterology;  Laterality: N/A;   KNEE SURGERY  2004/2010   arthroscopic/ bilateral knee replacements   LOOP RECORDER IMPLANT     MASTECTOMY Right 2013   PORT-A-CATH REMOVAL N/A 12/07/2016   Procedure: REMOVAL PORT-A-CATH;  Surgeon: Rolm Bookbinder, MD;  Location: [facility];  Service: General;  Laterality: N/A;   PORTACATH PLACEMENT Right 08/11/2014   Procedure: INSERTION PORT-A-CATH WITH ULTRASOUND;  Surgeon: Rolm Bookbinder, MD;  Location: [facility];  Service: General;  Laterality: Right;   [place]   right   tmi  1998   TONSILLECTOMY     TOTAL KNEE ARTHROPLASTY Bilateral    Family History  Problem Relation Age of Onset   Colon cancer Mother    Cancer Mother        colon   Colon cancer Maternal Aunt    Cancer Maternal Uncle    Prostate cancer Maternal Uncle    Prostate cancer Maternal Uncle    Other Maternal Uncle    Cancer Sister        kidney   Cancer Brother        lymph node   Anesthesia problems Neg Hx    Hypotension Neg Hx    Malignant hyperthermia Neg Hx    Pseudochol deficiency Neg Hx  Breast cancer Neg Hx    Social History   Socioeconomic History   Marital status: Widowed    Spouse name: Not on file   Number of children: 2   Years of education: 12   Highest education level: High school graduate  Occupational History   Occupation: retired  Tobacco Use   Smoking status: Former    Packs/day: 0.10    Years: 20.00    Pack years: 2.00    Types: Cigarettes    Quit date: 01/24/1978    Years since quitting: 42.9   Smokeless tobacco: Never  Vaping Use   Vaping Use: Never used  Substance and Sexual Activity   Alcohol use: No   Drug use: No   Sexual activity: Yes    Birth control/protection: Surgical  Other Topics Concern   Not on file  Social History Narrative   Mrs Puig's two children live beside her, son and daughter.   She worked at [employer]   She has a walker, cane, BSC, and lift chair at home.   Social Determinants of Health   Financial Resource Strain: Low Risk    Difficulty of Paying Living Expenses: Not hard at all  Food Insecurity: No Food Insecurity   Worried About Charity fundraiser in the Last Year: Never true   [place] in the Last Year: Never true  Transportation Needs: No Transportation Needs   Lack of Transportation (Medical): No   Lack of Transportation (Non-Medical): No  Physical Activity: Insufficiently Active   Days of Exercise per Week: 7 days   Minutes of Exercise per Session: 10 min   Stress: No Stress Concern Present   Feeling of Stress : Not at all  Social Connections: Moderately Isolated   Frequency of Communication with Friends and Family: More than three times a week   Frequency of Social Gatherings with Friends and Family: Once a week   Attends Religious Services: More than 4 times per year   Active Member of [employer] or Organizations: No   Attends Archivist Meetings: Never   Marital Status: Widowed      Cardiovascular Risk Factors: Hypertension and Overweight  Educational History: 12 years formal education Personal History of Seizures: No -  Personal History of Stroke: No -    Basic Activities of Daily Living  Dressing: Self-care Eating: Self-care Ambulation: Self-care Toileting: Self-care Bathing: Self-care  Instrumental Activities of Daily Living Shopping: Self-care House/Yard Work: Self-care Administration of medications: Self-care Finances: Self-care Telephone: Self-care Transportation: Self-care  Mobility Assist Devices: Cane 1- point and Environmental consultant, wheeled  Caregivers in home: Self.    Formal Home Health Assistance  Physical Therapy: no, Did have Physical Therapy for left frozen shoulder after fall in 2019  FALLS in last five office visits:  Fall Risk  01/07/2021 11/11/2020 11/11/2020 03/26/2020 03/18/2020  Falls in the past year? 1 0 0 1 1  Number falls in past yr: 0 0 0 1 1  Injury with Fall? 1 0 0 1 1  Risk for fall due to : - Impaired vision;Impaired balance/gait Impaired balance/gait;Impaired vision - History of fall(s)  Follow up - Falls prevention discussed Falls prevention discussed Falls evaluation completed Falls evaluation completed    Health Maintenance reviewed: Immunization History  Administered Date(s) Administered   Fluad Quad(high Dose 65+) 09/26/2018   Influenza Split 10/20/2011   Influenza Whole 11/07/2006   Influenza, High Dose Seasonal PF 10/13/2016, 10/12/2017   Influenza-Unspecified 09/07/2013,  10/15/2015, 09/26/2018, 10/01/2019   Moderna  Sars-Covid-2 Vaccination 02/04/2019, 03/04/2019, 12/24/2019   Pfizer Covid-19 Vaccine Bivalent Booster 103yr & up 01/07/2021   Pneumococcal Conjugate-13 08/26/2013   Pneumococcal Polysaccharide-23 10/29/2007, 08/28/2017   Td 05/27/2008   Tdap 05/30/2018   Zoster, Live 10/29/2007   Health Maintenance Topics with due status: Overdue     Topic Date Due   Zoster Vaccines- Shingrix Never done   COLONOSCOPY (Pts 45-451yrInsurance coverage will need to be confirmed) 06/16/2015    Diet: Regular   Vital Signs Weight: 180 lb (81.6 kg) Body mass index is 29.05 kg/m. CrCl cannot be calculated (Patient's most recent lab result is older than the maximum 21 days allowed.). Body surface area is 1.95 meters squared. Vitals:   01/07/21 1359  BP: 129/77  Pulse: 78  SpO2: 97%  Weight: 180 lb (81.6 kg)  Height: 5' 6" (1.676 m)   Wt Readings from Last 3 Encounters:  01/07/21 180 lb (81.6 kg)  12/28/20 178 lb 6.4 oz (80.9 kg)  11/30/20 186 lb 1.1 oz (84.4 kg)   Vision Screening   Right eye Left eye Both eyes  Without correction 20/200 20/70 20/70  With correction     Hearing Screening - Comments:: Has hearing aids  Physical Examination:  VS reviewed Physical Exam  Vitals:   01/07/21 1359  BP: 129/77  Pulse: 78  SpO2: 97%    HEENT: Bilateral EAC adequately patent, I.e., able to see TMs; Clear fluid draining off tip of patient's nose.  Anterior cartilagineus septal mucosa with increase erythema General physical exam: well-dressed and groomed. Pleasant and cooperative. Cardiac: Regular rate and rhythm without murmur. No carotid bruits Lungs: Left posterior lower lung field post-tussive fine crackles.  Clear right lung field. No acc mm use Ext: wearing support hose  Parkinsonism Yes [] No [x]  Rigidity Ab nl ? Yes [] No [x]  Cranial Nerve Abnl ? Yes [] No [x]  Sensory Abnl ? Yes [] No [x]  Gait Abnl ? No Motor Abnl ? Yes [] No [x]   Tremor ? Yes [] No [x]     Sit-to-Stand Test: 7 / 30-seconds 4-Stage Stand Test:  Feet Side-by-side: Yes.       Feet Semi-tandem: Yes.       Feet Tandem: Yes.         Mini-Mental State Examination or Montreal Cognitive Assessment:  Patient did  require additional cues or prompts to complete tasks. Patient was cooperative and attentive to testing tasks Patient did  appear motivated to perform well    6CIT Screen 11/11/2020 11/11/2020 11/05/2019  What Year? 0 points 0 points 0 points  What month? 0 points 0 points 0 points  What time? 0 points 0 points -  Count back from 20 0 points 0 points 0 points  Months in reverse 4 points 4 points 4 points  Repeat phrase 8 points 8 points 4 points  Total Score 12 12 -    No flowsheet data found.      Montreal Cognitive Assessment  01/08/2021  Visuospatial/ Executive (0/5) 3  Naming (0/3) 2  Attention: Read list of digits (0/2) 2  Attention: Read list of letters (0/1) 1  Attention: Serial 7 subtraction starting at 100 (0/3) 0  Language: Repeat phrase (0/2) 0  Language : Fluency (0/1) 0  Abstraction (0/2) 0  Delayed Recall (0/5) 3  Orientation (0/6) 6  Total 17  Adjusted Score (based on education) 18     Labs No components found for: VITAMIND  No results  found for: VITAMINB12  No results found for: FOLATE  Lab Results  Component Value Date   TSH 0.56 08/19/2020    No results found for: RPR  No results found for: HIV    Chemistry      Component Value Date/Time   NA 142 11/03/2020 1109   NA 145 (H) 10/10/2018 1100   NA 143 12/06/2016 1057   K 4.0 11/03/2020 1109   K 4.2 12/06/2016 1057   CL 104 11/03/2020 1109   CL 107 04/05/2012 1253   CO2 31 11/03/2020 1109   CO2 27 12/06/2016 1057   BUN 16 11/03/2020 1109   BUN 23 10/10/2018 1100   BUN 18.5 12/06/2016 1057   CREATININE 0.68 11/03/2020 1109   CREATININE 0.83 12/13/2019 1041   CREATININE 0.80 10/22/2019 1404   CREATININE 0.8 12/06/2016 1057       Component Value Date/Time   CALCIUM 9.4 11/03/2020 1109   CALCIUM 9.5 12/06/2016 1057   ALKPHOS 100 11/03/2020 1109   ALKPHOS 110 12/06/2016 1057   AST 19 11/03/2020 1109   AST 20 12/13/2019 1041   AST 41 (H) 12/06/2016 1057   ALT 15 11/03/2020 1109   ALT 18 12/13/2019 1041   ALT 57 (H) 12/06/2016 1057   BILITOT 0.7 11/03/2020 1109   BILITOT 0.8 12/13/2019 1041   BILITOT 0.56 12/06/2016 1057       CrCl cannot be calculated (Patient's most recent lab result is older than the maximum 21 days allowed.).   No results found for: HGBA1C   _0 @ Vision Screening   Right eye Left eye Both eyes  Without correction 20/200 20/70 20/70  With correction     Hearing Screening - Comments:: Has hearing aids Lab Results  Component Value Date   WBC 9.2 11/03/2020   HGB 14.3 11/03/2020   HCT 44.1 11/03/2020   MCV 95.8 11/03/2020   PLT 187.0 11/03/2020      Imaging Head CT: 03/26/20: Normal Head CT  ------------------------------------------------------------------------------------------------------------------------------------------------------------------------------------------------------------------------------------------------------------------------------------------------------------------------------------------------------------------------------------------------------------------------------------------------------------------------------------------------------------   Problem List Items Addressed This Visit       High   Subacute cough   Relevant Orders   DG Chest 2 View   Vasomotor rhinitis - Primary   Relevant Medications   azelastine (ASTELIN) 0.1 % nasal spray   Follicular 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 ([name])     Medium    Memory loss     Low   Ankle edema, bilateral    Taking lasix 20 mg daily , wearing stockings and staying swollen.       Frozen shoulder, Left (Chronic)     Unprioritized   Advanced directives,  counseling/discussion    Will need discussion at next geriatric assessment clinic      Full dentures   Interstitial lung changes on CXR    Chronic interstitial changes first noted on 09/20/17 CXR  10/26/17 Initial Evaluation Dr Selinda Orion (Pulm):  "DOE (dyspnea on exertion) PFT's  917/19  FEV1 2.09 (93 % ) ratio 87  p 5 % improvement from saba p nothing prior to study with DLCO  60 % corrects to 83 % for alv volume  -  FVC 2.44 (81%)  10/26/2017   Walked RA x one lap @ 185 stopped due to  Sob at fast pace but no desats  She has been exposed to alkylating agents so could have mild PF on this basis but the hx/ timing are odd in that no exposure for almost a year prior to onset of  any symptoms but ESR(53 mm/hr) is moderately elevated and so could have other inflammatory causes of ILD that will need to be explored over time" " Regardless, first step for me with possible ILD is to treat the easily treatable and since there is assoc cough rec gerd rx based on Use of PPI is associated with improved survival time and with decreased radiologic fibrosis per King's study published in AJRCCM vol 184 p1390.  Dec 2011 and also may have other beneficial effects as per the latest review in [facility] vol 193 B6389 Jun 20016.  This may not always be cause and effect, but given how universally unimpressive and expensive  all the other  Drugs developed to day  have been for pf,   rec start  rx ppi / diet/ lifestyle modification and f/u with serial walking sats and lung volumes for now to put more points on the curve / establish firm baseline before considering additional measures"  12/07/17 F/U visit with Dr Melvyn Novas "Improved on empirical rx for gerd so  >>>  rec no change x 3 months then consider wean off and f/u with PCP if stays better, with GI if flares  Pulmonary f/u is prn "      Dilation of thoracic aorta ([place]) Plan annual surveillance with with CT in March-April period as dilation was not seen on transthoracic  echocardiogram in same time period as the CTA Chest that did find dilation. .  Advanced directives, counseling/discussion Will need discussion at next geriatric assessment clinic  Interstitial lung changes on CXR Chronic interstitial changes first noted on 09/20/17 CXR  10/26/17 Initial Evaluation Dr Selinda Orion (Pulm):  "DOE (dyspnea on exertion) PFT's  917/19  FEV1 2.09 (93 % ) ratio 87  p 5 % improvement from saba p nothing prior to study with DLCO  60 % corrects to 83 % for alv volume  -  FVC 2.44 (81%)  10/26/2017   Walked RA x one lap @ 185 stopped due to  Sob at fast pace but no desats  She has been exposed to alkylating agents so could have mild PF on this basis but the hx/ timing are odd in that no exposure for almost a year prior to onset of any symptoms but ESR(53 mm/hr) is moderately elevated and so could have other inflammatory causes of ILD that will need to be explored over time" " Regardless, first step for me with possible ILD is to treat the easily treatable and since there is assoc cough rec gerd rx based on Use of PPI is associated with improved survival time and with decreased radiologic fibrosis per King's study published in [facility] vol 184 p1390.  Dec 2011 and also may have other beneficial effects as per the latest review in [place] vol 193 H7342 Jun 20016.  This may not always be cause and effect, but given how universally unimpressive and expensive  all the other  Drugs developed to day  have been for pf,   rec start  rx ppi / diet/ lifestyle modification and f/u with serial walking sats and lung volumes for now to put more points on the curve / establish firm baseline before considering additional measures"  12/07/17 F/U visit with Dr Melvyn Novas "Improved on empirical rx for gerd so  >>>  rec no change x 3 months then consider wean off and f/u with PCP if stays better, with GI if flares  Pulmonary f/u is prn "  Ankle edema, bilateral Taking lasix 20 mg daily ,  wearing stockings and  staying swollen.    CPT E&M Office Visit Time Before Visit; reviewing medical records (e.g. recent visits, labs, studies): 15 minutes During Visit (F2F time): 25 minutes After Visit (discussion with family or HCP, prescribing, ordering, referring, calling result/recommendations or documenting on same day): 15 minutes Total Visit Time: 55 minutes

## 2021-01-08 ENCOUNTER — Encounter: Payer: Self-pay | Admitting: Family Medicine

## 2021-01-08 DIAGNOSIS — Z7189 Other specified counseling: Secondary | ICD-10-CM | POA: Insufficient documentation

## 2021-01-08 DIAGNOSIS — Z972 Presence of dental prosthetic device (complete) (partial): Secondary | ICD-10-CM

## 2021-01-08 DIAGNOSIS — H3530 Unspecified macular degeneration: Secondary | ICD-10-CM

## 2021-01-08 DIAGNOSIS — M25471 Effusion, right ankle: Secondary | ICD-10-CM

## 2021-01-08 DIAGNOSIS — K08109 Complete loss of teeth, unspecified cause, unspecified class: Secondary | ICD-10-CM

## 2021-01-08 DIAGNOSIS — J300 Vasomotor rhinitis: Secondary | ICD-10-CM | POA: Insufficient documentation

## 2021-01-08 DIAGNOSIS — M25472 Effusion, left ankle: Secondary | ICD-10-CM | POA: Insufficient documentation

## 2021-01-08 DIAGNOSIS — M7500 Adhesive capsulitis of unspecified shoulder: Secondary | ICD-10-CM

## 2021-01-08 HISTORY — DX: Effusion, left ankle: M25.472

## 2021-01-08 HISTORY — DX: Adhesive capsulitis of unspecified shoulder: M75.00

## 2021-01-08 HISTORY — DX: Vasomotor rhinitis: J30.0

## 2021-01-08 HISTORY — DX: Presence of dental prosthetic device (complete) (partial): Z97.2

## 2021-01-08 HISTORY — DX: Effusion, right ankle: M25.471

## 2021-01-08 HISTORY — DX: Complete loss of teeth, unspecified cause, unspecified class: K08.109

## 2021-01-08 HISTORY — DX: Unspecified macular degeneration: H35.30

## 2021-01-08 NOTE — Assessment & Plan Note (Signed)
Taking lasix 20 mg daily , wearing stockings and staying swollen.

## 2021-01-08 NOTE — Assessment & Plan Note (Addendum)
Chronic interstitial changes first noted on 09/20/17 CXR  10/26/17 Initial Evaluation Dr Selinda Orion [facility]):  "DOE (dyspnea on exertion) PFT's  917/19  FEV1 2.09 (93 % ) ratio 87  p 5 % improvement from saba p nothing prior to study with DLCO  60 % corrects to 83 % for alv volume  -  FVC 2.44 (81%)  10/26/2017   Walked RA x one lap @ 185 stopped due to  Sob at fast pace but no desats She has been exposed to alkylating agents so could have mild PF on this basis but the hx/ timing are odd in that no exposure for almost a year prior to onset of any symptoms but ESR(53 mm/hr) is moderately elevated and so could have other inflammatory causes of ILD that will need to be explored over time" " Regardless, first step for me with possible ILD is to treat the easily treatable and since there is assoc cough rec gerd rx based on Use of PPI is associated with improved survival time and with decreased radiologic fibrosis per King's study published in [facility] vol 184 p1390.  Dec 2011 and also may have other beneficial effects as per the latest review in [place] vol 193 H8299 Jun 20016. This may not always be cause and effect, but given how universally unimpressive and expensive  all the other  Drugs developed to day  have been for pf,   rec start  rx ppi / diet/ lifestyle modification and f/u with serial walking sats and lung volumes for now to put more points on the curve / establish firm baseline before considering additional measures"  12/07/17 F/U visit with Dr Melvyn Novas "Improved on empirical rx for gerd so  >>>  rec no change x 3 months then consider wean off and f/u with PCP if stays better, with GI if flares  Pulmonary f/u is prn "

## 2021-01-08 NOTE — Assessment & Plan Note (Signed)
Selena [name] is using scapula rotation to raise her left arm above her head. She feels she go as much as she could from rehab or her shoulder.  She is not interested in returning for more.

## 2021-01-08 NOTE — Assessment & Plan Note (Addendum)
Plan annual surveillance with with CT in March-April period as dilation was not seen on transthoracic echocardiogram in same time period as the CTA Chest that did find dilation. [name]

## 2021-01-08 NOTE — Assessment & Plan Note (Addendum)
Patient with a history of prolonged coughs documented starting in October 2019 with pulmonologist, Dr Melvyn Novas.  He diagnosed UAW cough treated with PPI.  She developed a cough after a bout of pneumonia in April 2021 that lasted about 3 months, poorly responsive to medications.   She developed a cough with CoViD19 pneumonia in February of this year.  It seems to have persisted in some form or other to now.  She was noted to have a gustatory rhinitis in July and then referred to ENT.  Maxillofacial CT was unremarkable.  ENT treated her with nasal ipratropium with initial improvement.  Starting 11/23/20 patient is seen for a new cough by PCP, then twice at urgent cares in November.  Her last UC visit 11/25 she was diagnosis with influenza A by rapid test.  Her treatments in Novemeber have included Allegra, Tessalon Pearls, Albuterol meter dosed inhaler, Oseltamivir, Ipratropium 0.06% NS, Medrol Dosepak, and promethazine-diabetes mellitus.  Her cough persists.   Review of her chest imaging studies and initial consultation with Dr Melvyn Novas in 10/2017 finds concern for some chronic interstitial lung disease.  Dr Melvyn Novas was not certain what the cause was, though he did mention her lymphoma chemotherapy with an Alkylating agent.   The best lead today appears to be her continuous clear rhinorrhea dripping from her nasal tip. Unfortuntely, Ipratropium NS does not appear to be helping adequately.   Will try adding Atelin NS for its ability to decrease vasomotor rhinitis.  Selena [name] will contact me next week to let me know if it is helping.    If her cough does not significantly improve with control of her rhinorrhea, reconsultation with Dr Melvyn Novas about the possibility of ILD may be appropriate.

## 2021-01-08 NOTE — Assessment & Plan Note (Signed)
Selena [name]'s MoCA score was 18/30.  But, allowance must be made for her impaired vision and feeling unwell. I would like to repeat this cognitive testing once she is feeling better, and use the MoCA-Blind form instead of the usual MoCA.

## 2021-01-08 NOTE — Assessment & Plan Note (Signed)
Selena [name]'s STEADI fall testing showed weakness in the proximal muscles.  Not suprising given her bilateral TKRs.   At the next visit, I would like her to consider OP Physical Therapy for strengthening.

## 2021-01-08 NOTE — Assessment & Plan Note (Signed)
Will need discussion at next geriatric assessment clinic

## 2021-01-10 ENCOUNTER — Other Ambulatory Visit: Payer: Self-pay | Admitting: Nurse Practitioner

## 2021-01-13 ENCOUNTER — Encounter: Payer: Self-pay | Admitting: Family Medicine

## 2021-01-17 ENCOUNTER — Encounter: Payer: Self-pay | Admitting: Family Medicine

## 2021-01-17 DIAGNOSIS — J849 Interstitial pulmonary disease, unspecified: Secondary | ICD-10-CM

## 2021-01-17 DIAGNOSIS — R053 Chronic cough: Secondary | ICD-10-CM

## 2021-01-17 DIAGNOSIS — Z79899 Other long term (current) drug therapy: Secondary | ICD-10-CM

## 2021-01-17 DIAGNOSIS — R0609 Other forms of dyspnea: Secondary | ICD-10-CM

## 2021-01-19 ENCOUNTER — Other Ambulatory Visit: Payer: Medicare Other

## 2021-01-19 ENCOUNTER — Telehealth: Payer: Self-pay

## 2021-01-19 ENCOUNTER — Other Ambulatory Visit: Payer: Self-pay

## 2021-01-19 DIAGNOSIS — Z79899 Other long term (current) drug therapy: Secondary | ICD-10-CM

## 2021-01-19 DIAGNOSIS — J849 Interstitial pulmonary disease, unspecified: Secondary | ICD-10-CM

## 2021-01-19 DIAGNOSIS — R0609 Other forms of dyspnea: Secondary | ICD-10-CM

## 2021-01-19 MED ORDER — FLUTICASONE PROPIONATE HFA 110 MCG/ACT IN AERO
2.0000 | INHALATION_SPRAY | Freq: Two times a day (BID) | RESPIRATORY_TRACT | 2 refills | Status: DC
Start: 2021-01-19 — End: 2022-01-27

## 2021-01-19 NOTE — Telephone Encounter (Signed)
Attempted to reach patient to inform of CT at [place] on Jan. 10th  at 2:00pm. No answer. LVM with information [address]

## 2021-01-19 NOTE — Telephone Encounter (Signed)
I spoke with Zenon Mayo, Selena [name]'s daughter in law.  Selena [name]'s nasal drip has reolved with the addition of astelin NS for vasomotor rhinitis, but unfortunately, the cough she has had for the last two months has not.    I feel left with possible persistent inflammatory lung state and her interstitial lung disease as the two possibilities of which I can think.   I recommended an empiric trial of inhaled corticosteroid and referral to see Dr Melvyn Novas for re-evaluation.    While awaiting the consultation, will repeat chest CT to look for changes since her Chest CTA with her CoViD pneumonia in March.   Selena [name] is to come into [facility] lab for Basic Metabolic Panel for the CT clearance.  Will repeat her slightly elevated prior BNP and ESR labs.

## 2021-01-20 LAB — BASIC METABOLIC PANEL
BUN/Creatinine Ratio: 17 (ref 12–28)
BUN: 12 mg/dL (ref 8–27)
CO2: 27 mmol/L (ref 20–29)
Calcium: 9.4 mg/dL (ref 8.7–10.3)
Chloride: 104 mmol/L (ref 96–106)
Creatinine, Ser: 0.72 mg/dL (ref 0.57–1.00)
Glucose: 105 mg/dL — ABNORMAL HIGH (ref 70–99)
Potassium: 4.4 mmol/L (ref 3.5–5.2)
Sodium: 146 mmol/L — ABNORMAL HIGH (ref 134–144)
eGFR: 83 mL/min/{1.73_m2} (ref >59)

## 2021-01-20 LAB — SEDIMENTATION RATE: Sed Rate: 11 mm/hr (ref 0–40)

## 2021-01-20 LAB — BRAIN NATRIURETIC PEPTIDE: BNP: 155.8 pg/mL — ABNORMAL HIGH (ref 0.0–100.0)

## 2021-01-22 NOTE — Telephone Encounter (Signed)
Spoke with patient. Informed her of the upcoming CT scan she has at [place]. She stated that she is aware of her appt. Salvatore Marvel, CMA

## 2021-02-02 ENCOUNTER — Ambulatory Visit
Admission: RE | Admit: 2021-02-02 | Discharge: 2021-02-02 | Disposition: A | Discharge status: Home / Self Care | Payer: Medicare Other | Source: Ambulatory Visit | Attending: Family Medicine | Admitting: Family Medicine

## 2021-02-02 ENCOUNTER — Other Ambulatory Visit: Payer: Self-pay

## 2021-02-02 DIAGNOSIS — J849 Interstitial pulmonary disease, unspecified: Secondary | ICD-10-CM

## 2021-02-02 DIAGNOSIS — R0609 Other forms of dyspnea: Secondary | ICD-10-CM

## 2021-02-03 ENCOUNTER — Telehealth: Payer: Self-pay | Admitting: Family Medicine

## 2021-02-03 NOTE — Telephone Encounter (Signed)
I spoke with Ms Toni Demo (daughter-in-law) about her mother-in-law's Chest CT results, including concern for slow progression from in a pulmonary scarring process since 2016.    The referral to [facility] [place] [facility] (Dr Melvyn Novas) has not gone through. I do not know the reason the referral has not gone thru.  We will look into getting patient an pulm consultation.

## 2021-02-08 ENCOUNTER — Telehealth: Payer: Self-pay | Admitting: Pulmonary Disease

## 2021-02-08 NOTE — Telephone Encounter (Signed)
ILD referral was received for patient.  Patient is established with Dr. Ander [name] and has OV scheduled 02/09/21.   I called and spoke with patient.  Patient stated she would like to see Dr. Ander [name] to discuss productive cough that is causing her not to be able to use her cpap at night.  Patient wants to discuss using O2 only at night. Patient stated she has a cough that is productive with thick, yellow sputum in the morning, and clear, thick sputum at other times. Patient had a HRCT 02/03/21 and was told she may have ILD.  Advised patient I would send a message to Dr. Ander [name] and at [facility] he can discuss HRCT.  If needed, Dr. Ander [name] can refer patient to ILD providers Dr. Vaughan Browner or Dr. Chase Caller and patient can be scheduled ILD consult while at [place].  Message routed to Dr. Ander [name] to advise

## 2021-02-09 ENCOUNTER — Other Ambulatory Visit: Payer: Self-pay

## 2021-02-09 ENCOUNTER — Ambulatory Visit: Payer: Medicare Other | Admitting: Pulmonary Disease

## 2021-02-09 ENCOUNTER — Encounter: Payer: Self-pay | Admitting: Pulmonary Disease

## 2021-02-09 VITALS — BP 110/78 | HR 70 | Temp 97.7°F | Ht 66.0 in | Wt 177.0 lb

## 2021-02-09 DIAGNOSIS — J849 Interstitial pulmonary disease, unspecified: Secondary | ICD-10-CM

## 2021-02-09 LAB — SEDIMENTATION RATE: Sed Rate: 26 mm/hr (ref 0–30)

## 2021-02-09 LAB — C-REACTIVE PROTEIN: CRP: 2.2 mg/dL (ref 0.5–20.0)

## 2021-02-09 MED ORDER — AZELASTINE-FLUTICASONE 137-50 MCG/ACT NA SUSP: 1.0000 | Freq: Every day | NASAL | 2 refills | Status: DC

## 2021-02-09 MED ORDER — AMOXICILLIN-POT CLAVULANATE 875-125 MG PO TABS: 1.0000 | ORAL_TABLET | Freq: Two times a day (BID) | ORAL | 0 refills | Status: AC

## 2021-02-09 NOTE — Progress Notes (Signed)
Selena [name]    [id number]    03-19-1935  Primary Care Physician:McDiarmid, Blane Ohara, MD  Referring Physician: McDiarmid, Blane Ohara, MD [address] [place],  [place] [address]  Chief complaint:    Patient is being seen for abnormal CT scan of the chest  HPI:  Recently had a CT scan of the chest showing evidence of interstitial lung disease, possible NSIP She did have COVID in April 2022  Has been having a persistent cough, persistent rhinorrhea On ipratropium nasal spray, Astelin nasal spray  Has not been able to tolerate CPAP nightly with persistent coughing at night  Denies any fevers or chills, occasional soreness around The face she did follow-up with ENT and has a CT scan of the sinuses from September 2021  Currently using Dayvigo at 5 mg-for insomnia  20 mg melatonin did not help with sleep issues  Wakes up with a dry mouth No morning headaches No night sweats No significant family history of sleep apnea that she can remember  She is not usually very sleepy during the day  She is tired during the day from multiple medical problems, does not take naps  Has no significant difficulty with CPAP but sometimes not able to leave it on through the night Sleep aid does help but sometimes still not able to sleep through the night   Outpatient Encounter Medications as of 02/09/2021  Medication Sig   albuterol (VENTOLIN HFA) 108 (90 Base) MCG/ACT inhaler Inhale 2 puffs into the lungs every 4 (four) hours as needed for wheezing or shortness of breath.   amLODipine (NORVASC) 5 MG tablet TAKE 1 TABLET BY MOUTH DAILY   azelastine (ASTELIN) 0.1 % nasal spray Place 2 sprays into both nostrils 2 (two) times daily. Use in each nostril as directed   ELIQUIS 5 MG TABS tablet TAKE 1 TABLET(5 MG) BY MOUTH TWICE DAILY   famotidine (PEPCID) 20 MG tablet TAKE 1 TABLET(20 MG) BY MOUTH AT BEDTIME   fexofenadine (ALLEGRA ALLERGY) 180 MG tablet Take 1 tablet (180 mg  total) by mouth daily. For rhinitis   fluticasone (FLOVENT HFA) 110 MCG/ACT inhaler Inhale 2 puffs into the lungs in the morning and at bedtime.   furosemide (LASIX) 20 MG tablet TAKE 1 TABLET(20 MG) BY MOUTH DAILY   ipratropium (ATROVENT) 0.06 % nasal spray Place 2 sprays into both nostrils 4 (four) times daily. As needed for nasal congestion, runny nose   metoprolol tartrate (LOPRESSOR) 25 MG tablet Take 1 tablet (25 mg total) by mouth 2 (two) times daily.   montelukast (SINGULAIR) 10 MG tablet TAKE 1 TABLET(10 MG) BY MOUTH AT BEDTIME   NON FORMULARY CBD oil   pantoprazole (PROTONIX) 40 MG tablet TAKE 1 TABLET(40 MG) BY MOUTH TWICE DAILY   No facility-administered encounter medications on file as of 02/09/2021.    Allergies as of 02/09/2021 - Review Complete 02/09/2021  Allergen Reaction Noted   Codeine phosphate Other (See Comments)    Adhesive [tape] Other (See Comments) 02/14/2018   Codeine Other (See Comments) 03/23/2011    Past Medical History:  Diagnosis Date   Acute gastric ulcer with hemorrhage    Acute on chronic diastolic congestive heart failure ([place]) 10/16/2018   AKI (acute kidney injury) ([place])    Anemia    PMH   Aneurysm (arteriovenous) of coronary vessels    Ankle edema, bilateral 01/08/2021   Atrial fibrillation ([place]) 01/01/2007   BACK PAIN, UPPER 07/09/2008  Breast cancer of upper-outer quadrant of right female breast ([place]) 03/21/2011   Breast cancer, stage 1 ([place])    Bruises easily    Cataract    history   Complication of anesthesia    COVID-19 virus infection 03/08/2020   Diverticulitis    DIVERTICULOSIS, COLON 12/26/2006   Essential hypertension 19/37/9024   Follicular 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 ([place]) 09/73/5329   Follicular non-Hodgkin's lymphoma ([name])    Frozen shoulder, Left 01/08/2021   Full dentures 01/08/2021   Goiter    multi-nodular   Gustatory rhinitis 01/07/2021   History of atrioventricular nodal ablation 10/16/2018    Hx of colonoscopy 2009   Influenza 12/18/2020   Insomnia 09/10/2014   LIVER FUNCTION TESTS, ABNORMAL, HX OF 12/26/2007   Macular degeneration, bilateral 01/08/2021   Memory loss 05/16/2007   Nocturnal hypoxemia 05/14/2020   OSTEOARTHRITIS 12/26/2006   takes Diclofenac daily but has stopped for surgery   Osteoarthritis 12/26/2006   Qualifier: Diagnosis of  By: Paulina Fusi, RN, Judy Lelon Frohlich    Paraspinal mass 07/16/2014   Pneumonia 11/11/2017   Pneumonia 01/31/2018   Pneumonia due to COVID-19 virus 03/08/2020   PONV (postoperative nausea and vomiting)    S/P ablation of atrial flutter 07/17/2018   Sensorineural hearing loss (SNHL) of both ears 01/07/2021   Sepsis ([place]) 04/02/2020   Shortness of breath 05/28/2019   Skin cancer    Syncope 08/21/2018   Thoracic aortic aneurysm 10/16/2018   Tibialis posterior tendinopathy 04/08/2019   Upper airway cough syndrome 10/27/2017   pfts 10/10/17 s sign airflow obst though the insp loop is somewhat flattened  - Allergy profile 10/26/2017 >  Eos 0.3 /  IgE  6 RAST neg - rx for gerd 10/26/17 > improved 12/07/2017    VERTIGO 09/07/2009   Has had Vestibular Rehab consultation   Visual impairment 01/07/2021   Wears dentures    Wears glasses    Wears hearing aid    bilateral    Past Surgical History:  Procedure Laterality Date   ABDOMINAL HYSTERECTOMY     ABLATION OF DYSRHYTHMIC FOCUS  2009   APPENDECTOMY     AUGMENTATION MAMMAPLASTY Right 2013   Subpectoral implant   BIOPSY  05/07/2019   Procedure: BIOPSY;  Surgeon: Irving Copas., MD;  Location: [facility];  Service: Gastroenterology;;   BREAST BIOPSY  2013   BREAST RECONSTRUCTION  04/14/2011   Procedure: BREAST RECONSTRUCTION;  Surgeon: Crissie Reese, MD;  Location: [facility];  Service: Plastics;  Laterality: Right;  Placement of Right Breast Tissue Expander with  use of Flex HD for Breast Reconstruction   BREAST SURGERY     right sm, snbx   CATARACT EXTRACTION W/ INTRAOCULAR LENS   IMPLANT, BILATERAL  2010   bilateral   COLONOSCOPY     COLONOSCOPY     ESOPHAGOGASTRODUODENOSCOPY (EGD) WITH PROPOFOL N/A 05/07/2019   Procedure: ESOPHAGOGASTRODUODENOSCOPY (EGD) WITH PROPOFOL;  Surgeon: Irving Copas., MD;  Location: [facility];  Service: Gastroenterology;  Laterality: N/A;   KNEE SURGERY  2004/2010   arthroscopic/ bilateral knee replacements   LOOP RECORDER IMPLANT     MASTECTOMY Right 2013   PORT-A-CATH REMOVAL N/A 12/07/2016   Procedure: REMOVAL PORT-A-CATH;  Surgeon: Rolm Bookbinder, MD;  Location: [facility];  Service: General;  Laterality: N/A;   PORTACATH PLACEMENT Right 08/11/2014   Procedure: INSERTION PORT-A-CATH WITH ULTRASOUND;  Surgeon: Rolm Bookbinder, MD;  Location: [facility];  Service: General;  Laterality: Right;   [place]  right   tmi  1998   TONSILLECTOMY     TOTAL KNEE ARTHROPLASTY Bilateral     Family History  Problem Relation Age of Onset   Colon cancer Mother    Cancer Mother        colon   Colon cancer Maternal Aunt    Cancer Maternal Uncle    Prostate cancer Maternal Uncle    Prostate cancer Maternal Uncle    Other Maternal Uncle    Cancer Sister        kidney   Cancer Brother        lymph node   Anesthesia problems Neg Hx    Hypotension Neg Hx    Malignant hyperthermia Neg Hx    Pseudochol deficiency Neg Hx    Breast cancer Neg Hx     Social History   Socioeconomic History   Marital status: Widowed    Spouse name: Not on file   Number of children: 2   Years of education: 12   Highest education level: High school graduate  Occupational History   Occupation: retired  Tobacco Use   Smoking status: Former    Packs/day: 0.10    Years: 20.00    Pack years: 2.00    Types: Cigarettes    Quit date: 01/24/1978    Years since quitting: 43.0   Smokeless tobacco: Never  Vaping Use   Vaping Use: Never used  Substance and Sexual Activity   Alcohol use: No   Drug use: No   Sexual activity: Yes     Birth control/protection: Surgical  Other Topics Concern   Not on file  Social History Narrative   Mrs Santalucia's two children live beside her, son and daughter.   She worked at [employer]   She has a walker, cane, BSC, and lift chair at home.   Social Determinants of Health   Financial Resource Strain: Low Risk    Difficulty of Paying Living Expenses: Not hard at all  Food Insecurity: No Food Insecurity   Worried About Charity fundraiser in the Last Year: Never true   [place] in the Last Year: Never true  Transportation Needs: No Transportation Needs   Lack of Transportation (Medical): No   Lack of Transportation (Non-Medical): No  Physical Activity: Insufficiently Active   Days of Exercise per Week: 7 days   Minutes of Exercise per Session: 10 min  Stress: No Stress Concern Present   Feeling of Stress : Not at all  Social Connections: Moderately Isolated   Frequency of Communication with Friends and Family: More than three times a week   Frequency of Social Gatherings with Friends and Family: Once a week   Attends Religious Services: More than 4 times per year   Active Member of [employer] or Organizations: No   Attends Archivist Meetings: Never   Marital Status: Widowed  Human resources officer Violence: Not At Risk   Fear of Current or Ex-Partner: No   Emotionally Abused: No   Physically Abused: No   Sexually Abused: No    Review of Systems  Constitutional:  Positive for fatigue.  Respiratory:  Positive for apnea, cough and shortness of breath.   Psychiatric/Behavioral:  Positive for sleep disturbance.    There were no vitals filed for this visit.    Physical Exam Constitutional:      Appearance: She is obese.  HENT:     Head: Normocephalic and atraumatic.     Nose: No congestion  or rhinorrhea.     Mouth/Throat:     Mouth: Mucous membranes are moist.  Eyes:     General:        Right eye: No discharge.        Left eye: No discharge.  Cardiovascular:      Rate and Rhythm: Normal rate and regular rhythm.     Heart sounds: No murmur heard.   No friction rub.  Pulmonary:     Effort: No respiratory distress.     Breath sounds: No stridor. Rales present. No wheezing or rhonchi.  Musculoskeletal:     Cervical back: No rigidity or tenderness.  Neurological:     Mental Status: She is alert.  Psychiatric:        Mood and Affect: Mood normal.   Data Reviewed: Diastolic dysfunction noted on echo from 09/07/2017  CT chest from 05/08/2019 reviewed by myself showing no infiltrative process in the lungs CT scan of the chest reviewed showing evidence of scarring with a gradient towards the bases, some bronchioloectasis.  PFT from 2019 revealed decreased diffusing capacity  Patient has mild obstructive sleep apnea but was significant daytime sleepiness -Initiated on CPAP    Assessment:  Mild obstructive sleep apnea on CPAP therapy -Unable to tolerate CPAP -Persistent cough with nasal stuffiness and congestion  She is worried about oxygen levels -We will get an overnight oximetry to ascertain adequate oxygenation  Daytime fatigue  Persistent cough with abnormal CT scan of the chest showing evidence of scarring -She did have COVID infection in April 2021 -Looking back on previous CTs there is some evidence of interstitial changes  Persistent cough related to rhinitis -Possibly an underlying infection although CT scan from September 2021 did not reveal sinus collections -Current medications do not seem to be helping including Astelin and ipratropium  Plan/Recommendations: Follow-up in 3 months  Will discontinue CPAP use,  Obtain overnight oximetry to ascertain if she requires oxygen supplementation  I will have her scheduled to see Dr. Chase Caller or Dr. Vaughan Browner for ILD evaluation  Course of antibiotics 875 of Augmentin twice daily for 10 days  We will order a 6-minute walk and PFT against next visit  Obtain some blood work  including ANA, rheumatoid factor, ESR, ANCA, CRP  Sherrilyn Rist MD [facility] 02/09/2021, 2:06 PM  CC: McDiarmid, Blane Ohara, MD

## 2021-02-09 NOTE — Patient Instructions (Addendum)
Augmentin 875 twice daily for 10 days  Overnight oximetry to check for oxygen requirement  You may stop using the CPAP  6-minute walk, PFT  Schedule to see Dr. Vaughan Browner or Chase Caller for ILD evaluation  Obtain ANA, rheumatoid factor, ESR, ANCA, CRP--some of the blood work may be needed down the line  I did call in a different nasal spray-use in place of the Astelin, it is a combination of Astelin and a nasal steroid

## 2021-02-09 NOTE — Telephone Encounter (Signed)
May schedule with Dr. Vaughan Browner or [facility] for evaluation for ILD  If she is not tolerating CPAP We need to perform an overnight oximetry to assess for how much oxygen she needs  her sleep apnea is mild, though oxygen supplementation does not treat sleep apnea, keeping the oxygen when he needs to be reduce his risk over time.  Cough likely related to interstitial lung disease  I will be glad to see her in follow-up if she can be scheduled before I am off in February

## 2021-02-10 LAB — RHEUMATOID FACTOR: Rheumatoid fact SerPl-aCnc: <14 IU/mL (ref <14)

## 2021-02-10 LAB — ANA: Anti Nuclear Antibody (ANA): NEGATIVE

## 2021-02-10 LAB — ANCA SCREEN W REFLEX TITER: ANCA Screen: NEGATIVE

## 2021-02-10 NOTE — Addendum Note (Signed)
Addended by: Dessie Coma on: 02/10/2021 09:15 AM   Modules accepted: Orders

## 2021-02-19 ENCOUNTER — Other Ambulatory Visit: Payer: Self-pay | Admitting: Family Medicine

## 2021-02-19 ENCOUNTER — Other Ambulatory Visit: Payer: Self-pay | Admitting: Nurse Practitioner

## 2021-02-26 ENCOUNTER — Other Ambulatory Visit: Payer: Self-pay | Admitting: Cardiovascular Disease

## 2021-02-26 DIAGNOSIS — I10 Essential (primary) hypertension: Secondary | ICD-10-CM

## 2021-03-05 ENCOUNTER — Encounter: Payer: Self-pay | Admitting: Cardiovascular Disease

## 2021-03-05 ENCOUNTER — Ambulatory Visit: Payer: Medicare Other | Admitting: Cardiovascular Disease

## 2021-03-05 ENCOUNTER — Other Ambulatory Visit: Payer: Self-pay

## 2021-03-05 VITALS — BP 144/68 | HR 84 | Ht 66.0 in | Wt 178.0 lb

## 2021-03-05 DIAGNOSIS — I480 Paroxysmal atrial fibrillation: Secondary | ICD-10-CM | POA: Diagnosis not present

## 2021-03-05 DIAGNOSIS — I10 Essential (primary) hypertension: Secondary | ICD-10-CM

## 2021-03-05 NOTE — Progress Notes (Signed)
Chief Complaint  Patient presents with   Follow-up    Atrial fibrillation   History of Present Illness: 86 yo female with history of HTN, breast cancer, Non-Hodgkins lymphoma, arthritis, thoracic aortic aneurysm and atrial fibrillation who is here today for cardiac follow up. I saw her as a new consult for the evaluation of dyspnea and thoracic aortic aneurysm in September 2019. She was seen in the ED at [facility] August 2019 with c/o dyspnea for 2 months. D-dimer was elevated. CTA chest negative for PE. There was a 4.2 cm thoracic aortic aneurysm. No evidence of aortic dissection. There was no underlying lung disease. Echo August 2019 with mild LVH, normal LV systolic function with FMBW=46-65%. There was grade 2 diastolic dysfunction. She had breast cancer March 2013 treated with right mastectomy and chemotherapy. She had remote atrial flutter in 2008 and had an ablation. At her first visit with me in September 2019 she denied palpitations or chest pain but did endorse dyspnea with minimal exertion. She was seen in the pulmonary office by Dr. Melvyn Novas but she is unclear if she was felt to have lung disease. Cardiac monitor February 2020 with PACs, PVCs and SVT. She had a syncopal event in June 2020. She was seen by Dr. Lovena Le and a loop recorder was placed August 2020. She was found to have atrial fibrillation and was started Eliquis. She had bright red emesis in 2021 and was admitted. EGD showed mild abnormalities in the esophagus and multiple gastric ulcers. She was started on a PPI.   She is here today for follow up. The patient denies any chest pain, dyspnea, palpitations, lower extremity edema, orthopnea, PND, dizziness, near syncope or syncope.   Primary Care Physician: McDiarmid, Blane Ohara, MD  Past Medical History:  Diagnosis Date   Acute gastric ulcer with hemorrhage    Acute on chronic diastolic congestive heart failure ([place]) 10/16/2018   AKI (acute kidney injury) ([place])    Anemia    PMH    Aneurysm (arteriovenous) of coronary vessels    Ankle edema, bilateral 01/08/2021   Atrial fibrillation ([place]) 01/01/2007   BACK PAIN, UPPER 07/09/2008   Breast cancer of upper-outer quadrant of right female breast ([place]) 03/21/2011   Breast cancer, stage 1 ([place])    Bruises easily    Cataract    history   Complication of anesthesia    COVID-19 virus infection 03/08/2020   Diverticulitis    DIVERTICULOSIS, COLON 12/26/2006   Essential hypertension 99/35/7017   Follicular 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 ([place]) 79/39/0300   Follicular non-Hodgkin's lymphoma ([name])    Frozen shoulder, Left 01/08/2021   Full dentures 01/08/2021   Goiter    multi-nodular   Gustatory rhinitis 01/07/2021   History of atrioventricular nodal ablation 10/16/2018   Hx of colonoscopy 2009   Influenza 12/18/2020   Insomnia 09/10/2014   LIVER FUNCTION TESTS, ABNORMAL, HX OF 12/26/2007   Macular degeneration, bilateral 01/08/2021   Memory loss 05/16/2007   Nocturnal hypoxemia 05/14/2020   OSTEOARTHRITIS 12/26/2006   takes Diclofenac daily but has stopped for surgery   Osteoarthritis 12/26/2006   Qualifier: Diagnosis of  By: Paulina Fusi RN, Judy Ann    Paraspinal mass 07/16/2014   Pneumonia 11/11/2017   Pneumonia 01/31/2018   Pneumonia due to COVID-19 virus 03/08/2020   PONV (postoperative nausea and vomiting)    S/P ablation of atrial flutter 07/17/2018   Sensorineural hearing loss (SNHL) of both ears 01/07/2021   Sepsis ([place]) 04/02/2020   Shortness  of breath 05/28/2019   Skin cancer    Syncope 08/21/2018   Thoracic aortic aneurysm 10/16/2018   Tibialis posterior tendinopathy 04/08/2019   Upper airway cough syndrome 10/27/2017   pfts 10/10/17 s sign airflow obst though the insp loop is somewhat flattened  - Allergy profile 10/26/2017 >  Eos 0.3 /  IgE  6 RAST neg - rx for gerd 10/26/17 > improved 12/07/2017    VERTIGO 09/07/2009   Has had Vestibular Rehab consultation   Visual impairment  01/07/2021   Wears dentures    Wears glasses    Wears hearing aid    bilateral    Past Surgical History:  Procedure Laterality Date   ABDOMINAL HYSTERECTOMY     ABLATION OF DYSRHYTHMIC FOCUS  2009   APPENDECTOMY     AUGMENTATION MAMMAPLASTY Right 2013   Subpectoral implant   BIOPSY  05/07/2019   Procedure: BIOPSY;  Surgeon: Irving Copas., MD;  Location: [facility];  Service: Gastroenterology;;   BREAST BIOPSY  2013   BREAST RECONSTRUCTION  04/14/2011   Procedure: BREAST RECONSTRUCTION;  Surgeon: Crissie Reese, MD;  Location: [facility];  Service: Plastics;  Laterality: Right;  Placement of Right Breast Tissue Expander with  use of Flex HD for Breast Reconstruction   BREAST SURGERY     right sm, snbx   CATARACT EXTRACTION W/ INTRAOCULAR LENS  IMPLANT, BILATERAL  2010   bilateral   COLONOSCOPY     COLONOSCOPY     ESOPHAGOGASTRODUODENOSCOPY (EGD) WITH PROPOFOL N/A 05/07/2019   Procedure: ESOPHAGOGASTRODUODENOSCOPY (EGD) WITH PROPOFOL;  Surgeon: Irving Copas., MD;  Location: [facility];  Service: Gastroenterology;  Laterality: N/A;   KNEE SURGERY  2004/2010   arthroscopic/ bilateral knee replacements   LOOP RECORDER IMPLANT     MASTECTOMY Right 2013   PORT-A-CATH REMOVAL N/A 12/07/2016   Procedure: REMOVAL PORT-A-CATH;  Surgeon: Rolm Bookbinder, MD;  Location: [facility];  Service: General;  Laterality: N/A;   PORTACATH PLACEMENT Right 08/11/2014   Procedure: INSERTION PORT-A-CATH WITH ULTRASOUND;  Surgeon: Rolm Bookbinder, MD;  Location: [facility];  Service: General;  Laterality: Right;   ROTATOR CUFF REPAIR  1999   right   tmi  1998   TONSILLECTOMY     TOTAL KNEE ARTHROPLASTY Bilateral     Current Outpatient Medications  Medication Sig Dispense Refill   albuterol (VENTOLIN HFA) 108 (90 Base) MCG/ACT inhaler Inhale 2 puffs into the lungs every 4 (four) hours as needed for wheezing or shortness of breath. 18 g 0   amLODipine (NORVASC) 5 MG tablet TAKE 1 TABLET  BY MOUTH DAILY 90 tablet 0   azelastine (ASTELIN) 0.1 % nasal spray Place 2 sprays into both nostrils 2 (two) times daily. Use in each nostril as directed 30 mL 12   Azelastine-Fluticasone 137-50 MCG/ACT SUSP Place 1 puff into the nose daily. 23 g 2   ELIQUIS 5 MG TABS tablet TAKE 1 TABLET(5 MG) BY MOUTH TWICE DAILY 60 tablet 5   famotidine (PEPCID) 20 MG tablet TAKE 1 TABLET(20 MG) BY MOUTH AT BEDTIME 90 tablet 0   fluticasone (FLOVENT HFA) 110 MCG/ACT inhaler Inhale 2 puffs into the lungs in the morning and at bedtime. 1 each 2   furosemide (LASIX) 20 MG tablet TAKE 1 TABLET(20 MG) BY MOUTH DAILY 90 tablet 0   ipratropium (ATROVENT) 0.06 % nasal spray Place 2 sprays into both nostrils 4 (four) times daily. As needed for nasal congestion, runny nose 15 mL 12   metoprolol tartrate (LOPRESSOR) 25  MG tablet Take 1 tablet (25 mg total) by mouth 2 (two) times daily. 180 tablet 1   montelukast (SINGULAIR) 10 MG tablet TAKE 1 TABLET(10 MG) BY MOUTH AT BEDTIME 90 tablet 0   NON FORMULARY CBD oil     pantoprazole (PROTONIX) 40 MG tablet TAKE 1 TABLET(40 MG) BY MOUTH TWICE DAILY 180 tablet 0   fexofenadine (ALLEGRA ALLERGY) 180 MG tablet Take 1 tablet (180 mg total) by mouth daily. For rhinitis (Patient not taking: Reported on 03/05/2021) 30 tablet 0   No current facility-administered medications for this visit.    Allergies  Allergen Reactions   Codeine Phosphate Other (See Comments)    Hallucinations   Adhesive [Tape] Other (See Comments)    Electrodes for EKG- skin blistering, erythema   Codeine Other (See Comments)    Hallucinations     Social History   Socioeconomic History   Marital status: Widowed    Spouse name: Not on file   Number of children: 2   Years of education: 12   Highest education level: High school graduate  Occupational History   Occupation: retired  Tobacco Use   Smoking status: Former    Packs/day: 0.10    Years: 20.00    Pack years: 2.00    Types: Cigarettes     Quit date: 01/24/1978    Years since quitting: 43.1   Smokeless tobacco: Never  Vaping Use   Vaping Use: Never used  Substance and Sexual Activity   Alcohol use: No   Drug use: No   Sexual activity: Yes    Birth control/protection: Surgical  Other Topics Concern   Not on file  Social History Narrative   Mrs Blocher's two children live beside her, son and daughter.   She worked at [employer]   She has a walker, cane, BSC, and lift chair at home.   Social Determinants of Health   Financial Resource Strain: Low Risk    Difficulty of Paying Living Expenses: Not hard at all  Food Insecurity: No Food Insecurity   Worried About Charity fundraiser in the Last Year: Never true   [place] in the Last Year: Never true  Transportation Needs: No Transportation Needs   Lack of Transportation (Medical): No   Lack of Transportation (Non-Medical): No  Physical Activity: Insufficiently Active   Days of Exercise per Week: 7 days   Minutes of Exercise per Session: 10 min  Stress: No Stress Concern Present   Feeling of Stress : Not at all  Social Connections: Moderately Isolated   Frequency of Communication with Friends and Family: More than three times a week   Frequency of Social Gatherings with Friends and Family: Once a week   Attends Religious Services: More than 4 times per year   Active Member of [employer] or Organizations: No   Attends Archivist Meetings: Never   Marital Status: Widowed  Human resources officer Violence: Not At Risk   Fear of Current or Ex-Partner: No   Emotionally Abused: No   Physically Abused: No   Sexually Abused: No    Family History  Problem Relation Age of Onset   Colon cancer Mother    Cancer Mother        colon   Colon cancer Maternal Aunt    Cancer Maternal Uncle    Prostate cancer Maternal Uncle    Prostate cancer Maternal Uncle    Other Maternal Uncle    Cancer Sister  kidney   Cancer Brother        lymph node   Anesthesia  problems Neg Hx    Hypotension Neg Hx    Malignant hyperthermia Neg Hx    Pseudochol deficiency Neg Hx    Breast cancer Neg Hx     Review of Systems:  As stated in the HPI and otherwise negative.   BP (!) 144/68    Pulse 84    Ht 5\' 6"  (1.676 m)    Wt 178 lb (80.7 kg)    SpO2 98%    BMI 28.73 kg/m   Physical Examination: General: Well developed, well nourished, NAD  HEENT: OP clear, mucus membranes moist  SKIN: warm, dry. No rashes. Neuro: No focal deficits  Musculoskeletal: Muscle strength 5/5 all ext  Psychiatric: Mood and affect normal  Neck: No JVD, no carotid bruits, no thyromegaly, no lymphadenopathy.  Lungs:Clear bilaterally, no wheezes, rhonci, crackles Cardiovascular: Irregular irregular. No murmurs, gallops or rubs. Abdomen:Soft. Bowel sounds present. Non-tender.  Extremities: No lower extremity edema. Pulses are 2 + in the bilateral DP/PT.  EKG is performed today I have personally reviewed the EKG and is shows atrial fibrillation  Lipid Panel    Component Value Date/Time   CHOL 154 02/19/2020 1526   TRIG 93 03/08/2020 1141   HDL 43.60 02/19/2020 1526   CHOLHDL 4 02/19/2020 1526   VLDL 41.2 (H) 02/19/2020 1526   LDLCALC 105 (H) 08/28/2017 1410   LDLDIRECT 98.0 02/19/2020 1526     Wt Readings from Last 3 Encounters:  03/05/21 178 lb (80.7 kg)  02/09/21 177 lb (80.3 kg)  01/07/21 180 lb (81.6 kg)     Other studies Reviewed: Additional studies/ records that were reviewed today include: . Review of the above records demonstrates:    Assessment and Plan:   1. Thoracic aortic aneurysm: Her thoracic aorta is slightly dilated but most recent chest CTA in April 2021 was read as normal caliber ascending aorta.   She was seen last year in the CT surgery office by Dr. Julien Girt and told there was nothing to do.   2. HTN: BP has been well controlled at home. Mild elevation today. She says she has white coat HTN. She is having sinus issues. No changes  3. Atrial fib  paroxysmal/SVT/PACs/PVCs/syncope: No palpitations. Rate controlled atrial fib today. Will continue Eliquis and beta blocker. Loop recorder in place followed by Dr. Lovena Le but she had this turned off due to cost of follow up checks.   Current medicines are reviewed at length with the patient today.  The patient does not have concerns regarding medicines.  The following changes have been made:  no change  Labs/ tests ordered today include:   Orders Placed This Encounter  Procedures   EKG 12-Lead     Disposition:   F/U with me in 12 months   Signed, Lauree Chandler, MD 03/05/2021 4:21 PM    [facility] [place], [place], [place]  [address] Phone: [phone]; Fax: [phone]

## 2021-03-05 NOTE — Patient Instructions (Signed)

## 2021-03-11 ENCOUNTER — Telehealth: Payer: Self-pay

## 2021-03-11 NOTE — Telephone Encounter (Signed)
ATC LVMTCB r/t ONO results which showed pt doesn't need O2 at night. ONO results were sent off to be scanned

## 2021-03-12 NOTE — Telephone Encounter (Signed)
Patient is aware of results and voiced her understanding.  Nothing further needed.   

## 2021-03-12 NOTE — Telephone Encounter (Signed)
Attempted to call pt but unable to reach. Left message for her to return call. 

## 2021-03-15 ENCOUNTER — Telehealth: Payer: Self-pay | Admitting: Cardiovascular Disease

## 2021-03-15 DIAGNOSIS — I10 Essential (primary) hypertension: Secondary | ICD-10-CM

## 2021-03-16 ENCOUNTER — Ambulatory Visit: Payer: Medicare Other | Admitting: Family Medicine

## 2021-03-16 ENCOUNTER — Other Ambulatory Visit: Payer: Self-pay

## 2021-03-16 DIAGNOSIS — R053 Chronic cough: Secondary | ICD-10-CM

## 2021-03-16 MED ORDER — CROMOLYN SODIUM 5.2 MG/ACT NA AERS: 1.0000 | INHALATION_SPRAY | Freq: Four times a day (QID) | NASAL | 12 refills | Status: DC

## 2021-03-16 MED ORDER — AMOXICILLIN-POT CLAVULANATE 875-125 MG PO TABS: 1.0000 | ORAL_TABLET | Freq: Two times a day (BID) | ORAL | 0 refills | Status: AC

## 2021-03-16 MED ORDER — HYDROCORTISONE (PERIANAL) 2.5 % EX CREA: 1.0000 "application " | TOPICAL_CREAM | Freq: Two times a day (BID) | CUTANEOUS | 0 refills | Status: DC

## 2021-03-16 NOTE — Patient Instructions (Signed)
Thank you for coming to see me today. It was a pleasure. Today we discussed your symptoms. You could have another sinus infection. I recommend a different nasal spay and augmentin for 7 days.    Follow up with pulmonology next week.   Please follow-up with your PCP if persistent symptoms. You might need to go back to ENT   If you have any questions or concerns, please do not hesitate to call the office at [phone].  Best wishes,   Dr Posey Pronto

## 2021-03-16 NOTE — Progress Notes (Signed)
° ° ° °  SUBJECTIVE:   CHIEF COMPLAINT / HPI:   Selena [name] is a 86 y.o. female presents for runny nose   Primary symptom: runny nose Duration: for the last 4 months, worse over the last 3 weeks  Associated symptoms: prolonged productive cough-yellow, chokes on it sometimes, diarrhea, weakness  Fever? Tmax?: no, checks frequently  Sick contacts: no Covid test: no  Covid vaccination(s): x 4 Complex hx: Recently treated with Augmentin for sinusitis. She has had this cough since she had COVID PNA last year in Feb 22.Exactly the same symptoms as when he saw Dr McDiarmid back in Dec. He prescribed Atelin nasal spray for rhinorrhea which helped a little. Has upcoming app with pulmonologist in 03/22/21. Saw ENT in the past and dx with gustatory rhinitis-treated with nasal ipatropium.  Tested positive for flu A 2 months.   [place] Office Visit from 03/16/2021 in [facility] [place]  PHQ-9 Total Score 11        Health Maintenance Due  Topic   Zoster Vaccines- Shingrix (1 of 2)   COLONOSCOPY (Pts 45-75yrs Insurance coverage will need to be confirmed)     PERTINENT  PMH / PSH: Afib, HTN, memory loss   OBJECTIVE:   BP 120/63    Pulse 72    Wt 180 lb 9.6 oz (81.9 kg)    SpO2 100%    BMI 29.15 kg/m    General: Alert, no acute distress, well appearing  HEENT: NCAT, rhinorrhea present, no pharyngeal edema or erythema, no cervical lymphadenopathy  Cardio: Normal S1 and S2, RRR, no r/m/g Pulm: CTAB, normal work of breathing Abdomen: Bowel sounds normal. Abdomen soft and non-tender.  Extremities: No peripheral edema.  Neuro: Cranial nerves grossly intact   ASSESSMENT/PLAN:   Chronic coughing Complex history and likely multifactorial etiology of her symptoms: interstitial disease, chronic rhinorrhea and infection. Very low suspicion for covid and flu given chronicity of symptoms. Pt recently treated with Augmentin which helped with her symptoms. Precepted pt with Dr  Nori Riis. Will treat with Augmentin again and see if this help. Also prescribed cromolyn nasal spray to see if this will help with her rhinorrhea. She also has an appointment with pulmonology next week and Dr McDiarmid soon after so she can follow up on her symptoms. Strict ER precautions given to pt.     Lattie Haw, MD PGY-3 [place]

## 2021-03-18 MED ORDER — AMLODIPINE BESYLATE 5 MG PO TABS: 5.0000 mg | ORAL_TABLET | Freq: Every day | ORAL | 3 refills | Status: DC

## 2021-03-18 NOTE — Telephone Encounter (Signed)
°*  STAT* If patient is at the pharmacy, call can be transferred to refill team.   1. Which medications need to be refilled? (please list name of each medication and dose if known) new prescription for Amlodipine  2. Which pharmacy/location (including street and city if local pharmacy) is medication to be sent to? [place], [place]  3. Do they need a 30 day or 90 day supply? 90 days and refills

## 2021-03-18 NOTE — Telephone Encounter (Signed)
Pt's medication was sent to pt's pharmacy as requested. Confirmation received.  °

## 2021-03-22 ENCOUNTER — Ambulatory Visit (INDEPENDENT_AMBULATORY_CARE_PROVIDER_SITE_OTHER): Payer: Medicare Other | Admitting: Pulmonary Disease

## 2021-03-22 ENCOUNTER — Other Ambulatory Visit: Payer: Self-pay

## 2021-03-22 ENCOUNTER — Encounter: Payer: Self-pay | Admitting: Pulmonary Disease

## 2021-03-22 ENCOUNTER — Ambulatory Visit: Payer: Medicare Other | Admitting: Pulmonary Disease

## 2021-03-22 VITALS — BP 122/68 | HR 69 | Temp 97.7°F | Ht 67.0 in | Wt 180.0 lb

## 2021-03-22 DIAGNOSIS — J849 Interstitial pulmonary disease, unspecified: Secondary | ICD-10-CM | POA: Diagnosis not present

## 2021-03-22 DIAGNOSIS — Z9989 Dependence on other enabling machines and devices: Secondary | ICD-10-CM

## 2021-03-22 DIAGNOSIS — G4733 Obstructive sleep apnea (adult) (pediatric): Secondary | ICD-10-CM | POA: Diagnosis not present

## 2021-03-22 LAB — PULMONARY FUNCTION TEST
DL/VA % pred: 139 %
DL/VA: 5.54 ml/min/mmHg/L
DLCO cor % pred: 87 %
DLCO cor: 17.92 ml/min/mmHg
DLCO unc % pred: 87 %
DLCO unc: 17.92 ml/min/mmHg
FEF 25-75 Post: 2.62 L/sec
FEF 25-75 Pre: 1.87 L/sec
FEF2575-%Change-Post: 39 %
FEF2575-%Pred-Post: 182 %
FEF2575-%Pred-Pre: 130 %
FEV1-%Change-Post: 6 %
FEV1-%Pred-Post: 97 %
FEV1-%Pred-Pre: 91 %
FEV1-Post: 2.07 L
FEV1-Pre: 1.94 L
FEV1FVC-%Change-Post: 8 %
FEV1FVC-%Pred-Pre: 110 %
FEV6-%Change-Post: -1 %
FEV6-%Pred-Post: 87 %
FEV6-%Pred-Pre: 89 %
FEV6-Post: 2.37 L
FEV6-Pre: 2.41 L
FEV6FVC-%Pred-Post: 105 %
FEV6FVC-%Pred-Pre: 105 %
FVC-%Change-Post: -1 %
FVC-%Pred-Post: 82 %
FVC-%Pred-Pre: 84 %
FVC-Post: 2.37 L
FVC-Pre: 2.41 L
Post FEV1/FVC ratio: 87 %
Post FEV6/FVC ratio: 100 %
Pre FEV1/FVC ratio: 81 %
Pre FEV6/FVC Ratio: 100 %
RV % pred: 50 %
RV: 1.31 L
TLC % pred: 64 %
TLC: 3.56 L

## 2021-03-22 NOTE — Patient Instructions (Signed)
Full PFT performed today. °

## 2021-03-22 NOTE — Progress Notes (Signed)
Full PFT performed today. °

## 2021-03-22 NOTE — Assessment & Plan Note (Addendum)
Complex history and likely multifactorial etiology of her symptoms: interstitial disease, chronic rhinorrhea and infection. Very low suspicion for covid and flu given chronicity of symptoms. Pt recently treated with Augmentin which helped with her symptoms. Precepted pt with Dr Nori Riis. Will treat with Augmentin again and see if this help. Also prescribed cromolyn nasal spray to see if this will help with her rhinorrhea. She also has an appointment with pulmonology next week and Dr McDiarmid soon after so she can follow up on her symptoms. Strict ER precautions given to pt.

## 2021-03-22 NOTE — Patient Instructions (Addendum)
I have reviewed your CT scan which shows mild changes which could be from the COVID infection We will continue to monitor this Order high-res CT in 6 months For postnasal drip continue to use the nasal spray and antiacid medication You can use chlorpheniramine over-the-counter 4 mg at night to help with postnasal drip  Return to clinic in 3 months

## 2021-03-22 NOTE — Progress Notes (Signed)
Selena [name]    [id number]    June 24, 1935  Primary Care Physician:McDiarmid, Blane Ohara, MD  Referring Physician: McDiarmid, Blane Ohara, MD [address] [place],  [place] [address]  Chief complaint: Consult for dyspnea  HPI: 86 year old with history of OSA on CPAP, CHF, breast cancer, hypertension, non-Hodgkin's lymphoma, chronic A-fib on Eliquis Referred by Dr. Ander Slade for evaluation of abnormal CT scan She has history of chronic intermittent dyspnea on exertion.  No symptoms at rest.  History notable for COVID-19 pneumonia requiring hospitalization February 2022.  Had a repeat hospitalization in March 2022 with severe sepsis due to E. coli bacteremia and UTI.  She has history of mild OSA on CPAP and follows with Dr. 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  No feather pillows or comforter ILD questionnaire 03/23/2020-negative Smoking history: Remote smoking history.  Quit in the 1980s Travel history: No significant travel history Relevant family history: No family history of lung disease  Outpatient Encounter Medications as of 03/22/2021  Medication Sig   albuterol (VENTOLIN HFA) 108 (90 Base) MCG/ACT inhaler Inhale 2 puffs into the lungs every 4 (four) hours as needed for wheezing or shortness of breath.   amLODipine (NORVASC) 5 MG tablet Take 1 tablet (5 mg total) by mouth daily.   amoxicillin-clavulanate (AUGMENTIN) 875-125 MG tablet Take 1 tablet by mouth 2 (two) times daily for 7 days.   azelastine (ASTELIN) 0.1 % nasal spray Place 2 sprays into both nostrils 2 (two) times daily. Use in each nostril as directed   Azelastine-Fluticasone 137-50 MCG/ACT SUSP Place 1 puff into the nose daily.   cromolyn (NASALCROM) 5.2 MG/ACT nasal spray Place 1 spray into both nostrils 4 (four) times daily.   ELIQUIS 5 MG TABS tablet TAKE 1 TABLET(5 MG) BY MOUTH TWICE DAILY   famotidine (PEPCID) 20 MG tablet TAKE  1 TABLET(20 MG) BY MOUTH AT BEDTIME   fexofenadine (ALLEGRA ALLERGY) 180 MG tablet Take 1 tablet (180 mg total) by mouth daily. For rhinitis   fluticasone (FLOVENT HFA) 110 MCG/ACT inhaler Inhale 2 puffs into the lungs in the morning and at bedtime.   furosemide (LASIX) 20 MG tablet TAKE 1 TABLET(20 MG) BY MOUTH DAILY   hydrocortisone (ANUSOL-HC) 2.5 % rectal cream Place 1 application rectally 2 (two) times daily.   ipratropium (ATROVENT) 0.06 % nasal spray Place 2 sprays into both nostrils 4 (four) times daily. As needed for nasal congestion, runny nose   metoprolol tartrate (LOPRESSOR) 25 MG tablet Take 1 tablet (25 mg total) by mouth 2 (two) times daily.   montelukast (SINGULAIR) 10 MG tablet TAKE 1 TABLET(10 MG) BY MOUTH AT BEDTIME   NON FORMULARY CBD oil   pantoprazole (PROTONIX) 40 MG tablet TAKE 1 TABLET(40 MG) BY MOUTH TWICE DAILY   No facility-administered encounter medications on file as of 03/22/2021.    Allergies as of 03/22/2021 - Review Complete 03/22/2021  Allergen Reaction Noted   Codeine phosphate Other (See Comments)    Adhesive [tape] Other (See Comments) 02/14/2018   Codeine Other (See Comments) 03/23/2011    Past Medical History:  Diagnosis Date   Acute gastric ulcer with hemorrhage    Acute on chronic diastolic congestive heart failure ([facility]) 10/16/2018   AKI (acute kidney injury) ([facility])    Anemia    PMH   Aneurysm (arteriovenous) of coronary vessels    Ankle edema, bilateral 01/08/2021   Atrial  fibrillation ([facility]) 01/01/2007   BACK PAIN, UPPER 07/09/2008   Breast cancer of upper-outer quadrant of right female breast ([facility]) 03/21/2011   Breast cancer, stage 1 ([place])    Bruises easily    Cataract    history   Complication of anesthesia    COVID-19 virus infection 03/08/2020   Diverticulitis    DIVERTICULOSIS, COLON 12/26/2006   Essential hypertension 85/63/1497   Follicular 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 ([place]) 02/63/7858   Follicular  non-Hodgkin's lymphoma ([name])    Frozen shoulder, Left 01/08/2021   Full dentures 01/08/2021   Goiter    multi-nodular   Gustatory rhinitis 01/07/2021   History of atrioventricular nodal ablation 10/16/2018   Hx of colonoscopy 2009   Influenza 12/18/2020   Insomnia 09/10/2014   LIVER FUNCTION TESTS, ABNORMAL, HX OF 12/26/2007   Macular degeneration, bilateral 01/08/2021   Memory loss 05/16/2007   Nocturnal hypoxemia 05/14/2020   OSTEOARTHRITIS 12/26/2006   takes Diclofenac daily but has stopped for surgery   Osteoarthritis 12/26/2006   Qualifier: Diagnosis of  By: Paulina Fusi, RN, Judy Lelon Frohlich    Paraspinal mass 07/16/2014   Pneumonia 11/11/2017   Pneumonia 01/31/2018   Pneumonia due to COVID-19 virus 03/08/2020   PONV (postoperative nausea and vomiting)    S/P ablation of atrial flutter 07/17/2018   Sensorineural hearing loss (SNHL) of both ears 01/07/2021   Sepsis ([facility]) 04/02/2020   Shortness of breath 05/28/2019   Skin cancer    Syncope 08/21/2018   Thoracic aortic aneurysm 10/16/2018   Tibialis posterior tendinopathy 04/08/2019   Upper airway cough syndrome 10/27/2017   pfts 10/10/17 s sign airflow obst though the insp loop is somewhat flattened  - Allergy profile 10/26/2017 >  Eos 0.3 /  IgE  6 RAST neg - rx for gerd 10/26/17 > improved 12/07/2017    VERTIGO 09/07/2009   Has had Vestibular Rehab consultation   Visual impairment 01/07/2021   Wears dentures    Wears glasses    Wears hearing aid    bilateral    Past Surgical History:  Procedure Laterality Date   ABDOMINAL HYSTERECTOMY     ABLATION OF DYSRHYTHMIC FOCUS  2009   APPENDECTOMY     AUGMENTATION MAMMAPLASTY Right 2013   Subpectoral implant   BIOPSY  05/07/2019   Procedure: BIOPSY;  Surgeon: Irving Copas., MD;  Location: [facility];  Service: Gastroenterology;;   BREAST BIOPSY  2013   BREAST RECONSTRUCTION  04/14/2011   Procedure: BREAST RECONSTRUCTION;  Surgeon: Crissie Reese, MD;  Location: [facility];   Service: Plastics;  Laterality: Right;  Placement of Right Breast Tissue Expander with  use of Flex HD for Breast Reconstruction   BREAST SURGERY     right sm, snbx   CATARACT EXTRACTION W/ INTRAOCULAR LENS  IMPLANT, BILATERAL  2010   bilateral   COLONOSCOPY     COLONOSCOPY     ESOPHAGOGASTRODUODENOSCOPY (EGD) WITH PROPOFOL N/A 05/07/2019   Procedure: ESOPHAGOGASTRODUODENOSCOPY (EGD) WITH PROPOFOL;  Surgeon: Irving Copas., MD;  Location: [facility];  Service: Gastroenterology;  Laterality: N/A;   KNEE SURGERY  2004/2010   arthroscopic/ bilateral knee replacements   LOOP RECORDER IMPLANT     MASTECTOMY Right 2013   PORT-A-CATH REMOVAL N/A 12/07/2016   Procedure: REMOVAL PORT-A-CATH;  Surgeon: Rolm Bookbinder, MD;  Location: [facility];  Service: General;  Laterality: N/A;   PORTACATH PLACEMENT Right 08/11/2014   Procedure: INSERTION PORT-A-CATH WITH ULTRASOUND;  Surgeon: Rolm Bookbinder, MD;  Location: [facility];  Service:  General;  Laterality: Right;   ROTATOR CUFF REPAIR  1999   right   tmi  1998   TONSILLECTOMY     TOTAL KNEE ARTHROPLASTY Bilateral     Family History  Problem Relation Age of Onset   Colon cancer Mother    Cancer Mother        colon   Colon cancer Maternal Aunt    Cancer Maternal Uncle    Prostate cancer Maternal Uncle    Prostate cancer Maternal Uncle    Other Maternal Uncle    Cancer Sister        kidney   Cancer Brother        lymph node   Anesthesia problems Neg Hx    Hypotension Neg Hx    Malignant hyperthermia Neg Hx    Pseudochol deficiency Neg Hx    Breast cancer Neg Hx     Social History   Socioeconomic History   Marital status: Widowed    Spouse name: Not on file   Number of children: 2   Years of education: 12   Highest education level: High school graduate  Occupational History   Occupation: retired  Tobacco Use   Smoking status: Former    Packs/day: 0.10    Years: 20.00    Pack years: 2.00    Types: Cigarettes    Quit  date: 01/24/1978    Years since quitting: 43.1    Passive exposure: Past   Smokeless tobacco: Never  Vaping Use   Vaping Use: Never used  Substance and Sexual Activity   Alcohol use: No   Drug use: No   Sexual activity: Yes    Birth control/protection: Surgical  Other Topics Concern   Not on file  Social History Narrative   Mrs Gielow's two children live beside her, son and daughter.   She worked at [employer]   She has a walker, cane, BSC, and lift chair at home.   Social Determinants of Health   Financial Resource Strain: Low Risk    Difficulty of Paying Living Expenses: Not hard at all  Food Insecurity: No Food Insecurity   Worried About Charity fundraiser in the Last Year: Never true   [place] in the Last Year: Never true  Transportation Needs: No Transportation Needs   Lack of Transportation (Medical): No   Lack of Transportation (Non-Medical): No  Physical Activity: Insufficiently Active   Days of Exercise per Week: 7 days   Minutes of Exercise per Session: 10 min  Stress: No Stress Concern Present   Feeling of Stress : Not at all  Social Connections: Moderately Isolated   Frequency of Communication with Friends and Family: More than three times a week   Frequency of Social Gatherings with Friends and Family: Once a week   Attends Religious Services: More than 4 times per year   Active Member of [employer] or Organizations: No   Attends Archivist Meetings: Never   Marital Status: Widowed  Human resources officer Violence: Not At Risk   Fear of Current or Ex-Partner: No   Emotionally Abused: No   Physically Abused: No   Sexually Abused: No    Review of systems: Review of Systems  Constitutional: Negative for fever and chills.  HENT: Negative.   Eyes: Negative for blurred vision.  Respiratory: as per HPI  Cardiovascular: Negative for chest pain and palpitations.  Gastrointestinal: Negative for vomiting, diarrhea, blood per rectum. Genitourinary:  Negative for dysuria, urgency, frequency  and hematuria.  Musculoskeletal: Negative for myalgias, back pain and joint pain.  Skin: Negative for itching and rash.  Neurological: Negative for dizziness, tremors, focal weakness, seizures and loss of consciousness.  Endo/Heme/Allergies: Negative for environmental allergies.  Psychiatric/Behavioral: Negative for depression, suicidal ideas and hallucinations.  All other systems reviewed and are negative.  Physical Exam: Blood pressure 122/68, pulse 69, temperature 97.7 F (36.5 C), temperature source Oral, height 5\' 7"  (1.702 m), weight 180 lb (81.6 kg), SpO2 98 %. Gen:      No acute distress HEENT:  EOMI, sclera anicteric Neck:     No masses; no thyromegaly Lungs:    Clear to auscultation bilaterally; normal respiratory effort CV:         Regular rate and rhythm; no murmurs Abd:      + bowel sounds; soft, non-tender; no palpable masses, no distension Ext:    No edema; adequate peripheral perfusion Skin:      Warm and dry; no rash Neuro: alert and oriented x 3 Psych: normal mood and affect  Data Reviewed: Imaging: CT 05/08/2019-mild interstitial changes with chronic scarring. CT high-resolution 02/02/2021-mild pulmonary fibrosis with basal gradient with groundglass opacities, mild bronchiectasis.  Alternate pattern.  I have reviewed the images personally.  PFTs: 03/22/2021 FVC 2.37 [82%], FEV1 2.07 [91%], F/F 87, TLC 3.56 [64%], DLCO 17.92 [87%] Mild restriction  Labs: ANA, rheumatoid factor 02/09/2021- negative IgE 10/26/2017-6  Assessment:  Evaluation for ILD CT shows mild changes in indeterminate/alternate pattern.  She does have some mild interstitial changes prior to COVID but worsened after her hospitalization for COVID-19 pneumonia.  ANA, rheumatoid factor negative earlier this year.  She does not have any exposures or CTD serologies  I think we can continue to monitor this for now.  I have ordered a follow-up CT in 6  months  Postnasal drip, GERD Continue nasal spray, antiacid medication She tried chlorpheniramine over-the-counter at night to help with postnasal drip  Plan/Recommendations: CT in 6 months  Marshell Garfinkel MD [facility] 03/22/2021, 11:45 AM  CC: McDiarmid, Blane Ohara, MD

## 2021-03-23 ENCOUNTER — Encounter: Payer: Self-pay | Admitting: Pulmonary Disease

## 2021-03-28 ENCOUNTER — Other Ambulatory Visit: Payer: Self-pay | Admitting: Cardiovascular Disease

## 2021-03-28 ENCOUNTER — Other Ambulatory Visit: Payer: Self-pay | Admitting: Nurse Practitioner

## 2021-03-29 ENCOUNTER — Other Ambulatory Visit: Payer: Self-pay | Admitting: *Deleted

## 2021-03-29 ENCOUNTER — Telehealth: Payer: Self-pay

## 2021-03-29 MED ORDER — METOPROLOL TARTRATE 25 MG PO TABS: 25.0000 mg | ORAL_TABLET | Freq: Two times a day (BID) | ORAL | 3 refills | Status: DC

## 2021-03-29 NOTE — Telephone Encounter (Signed)
Patient calls nurse line requesting refill on amoxicillin- clavulanate 875-125 mg. Patient reports finishing medication approx one week ago. Reports that since then runny nose, cough and diarrhea has returned.  ? ?Advised patient that follow up appointment would likely be needed prior to refilling antibiotics.  ? ?Patient has appointment with PCP on 3/15. Offered to schedule for sooner appointment, however, patient declined and states, "I will wait until the 15th if I have to." ? ?Will forward to PCP for further advisement.  ? ?Talbot Grumbling, RN ? ?

## 2021-04-05 DIAGNOSIS — J984 Other disorders of lung: Secondary | ICD-10-CM

## 2021-04-05 HISTORY — DX: Other disorders of lung: J98.4

## 2021-04-07 ENCOUNTER — Ambulatory Visit: Payer: Medicare Other | Admitting: Family Medicine

## 2021-04-07 ENCOUNTER — Other Ambulatory Visit: Payer: Self-pay

## 2021-04-07 ENCOUNTER — Encounter: Payer: Self-pay | Admitting: Family Medicine

## 2021-04-07 VITALS — BP 110/56 | Temp 97.8°F | Ht 67.0 in | Wt 179.2 lb

## 2021-04-07 DIAGNOSIS — K649 Unspecified hemorrhoids: Secondary | ICD-10-CM | POA: Diagnosis not present

## 2021-04-07 DIAGNOSIS — J984 Other disorders of lung: Secondary | ICD-10-CM

## 2021-04-07 DIAGNOSIS — R159 Full incontinence of feces: Secondary | ICD-10-CM

## 2021-04-07 DIAGNOSIS — R053 Chronic cough: Secondary | ICD-10-CM

## 2021-04-07 DIAGNOSIS — I10 Essential (primary) hypertension: Secondary | ICD-10-CM

## 2021-04-07 DIAGNOSIS — J3489 Other specified disorders of nose and nasal sinuses: Secondary | ICD-10-CM

## 2021-04-07 DIAGNOSIS — J849 Interstitial pulmonary disease, unspecified: Secondary | ICD-10-CM

## 2021-04-07 LAB — HEMOCCULT GUIAC POC 1CARD (OFFICE): Fecal Occult Blood, POC: POSITIVE — AB

## 2021-04-07 NOTE — Patient Instructions (Addendum)
I believe your difficulty holding the gas and stool is from a weak anal sphincter.  Physical therapists that specialize in pelvic rehabilitation can help you strengthen the anal sphincter so you will have control over the gas and stool. ? ?I would recommend you try a trial of Imodium (over the counter).  Take one tablet after your early morning bowel movement, then one tablet about 30 minutes before your breakfast and one tablet 30 minutes before your lunch.  Our goal is to decrease the number of bowel movements to once a day in the early morning.   ? ?If you start to get constipated, try stopping one or more of the Imodium doses until you find a balance in your bowel movements.   ? ?You are doing all the right things for your hemorrhoids.  Keep doing the Sitz baths and using the ointments when painful  If they become intolerable, I recommend you see a general surgeon.  ?

## 2021-04-07 NOTE — Progress Notes (Signed)
**Note De-Identified Selena Obfuscation** Selena [name] is alone ?Sources of clinical information for visit is/are patient and past medical records. ?Nursing assessment for this office visit was reviewed with the patient for accuracy and revision.  ? ? ? ?Previous Report(s) Reviewed: office visit notes from Dr Vaughan Browner and Dr Ander Slade.   ?Depression screen [facility] 2/9 03/16/2021  ?Decreased Interest 3  ?Down, Depressed, Hopeless 1  ?PHQ - 2 Score 4  ?Altered sleeping 2  ?Tired, decreased energy 3  ?Change in appetite 1  ?Feeling bad or failure about yourself  0  ?Trouble concentrating 1  ?Moving slowly or fidgety/restless 0  ?Suicidal thoughts 0  ?PHQ-9 Score 11  ?Difficult doing work/chores Somewhat difficult  ?Some recent data might be hidden  ? ?[facility] Office Visit from 03/16/2021 in [facility] Office Visit from 01/07/2021 in [facility]  ?Thoughts that you would be better off dead, or of hurting yourself in some way Not at all Not at all  ?PHQ-9 Total Score 11 6  ? ?  ?  ?Fall Risk  04/07/2021 03/16/2021 01/07/2021 11/11/2020 11/11/2020  ?Falls in the past year? 0 0 1 0 0  ?Number falls in past yr: 0 0 0 0 0  ?Injury with Fall? 0 - 1 0 0  ?Risk for fall due to : - - - Impaired vision;Impaired balance/gait Impaired balance/gait;Impaired vision  ?Follow up - - - Falls prevention discussed Falls prevention discussed  ? ? ?PHQ9 SCORE ONLY 03/16/2021 01/07/2021 11/11/2020  ?PHQ-9 Total Score 11 6 0  ? ? ?Adult vaccines due  ?Topic Date Due  ? TETANUS/TDAP  05/29/2028  ? ? ?Health Maintenance Due  ?Topic Date Due  ? Zoster Vaccines- Shingrix (1 of 2) Never done  ? COLONOSCOPY (Pts 45-89yr Insurance coverage will need to be confirmed)  06/16/2015  ?  ? ? ?History/P.E. limitations: none ? ?Adult vaccines due  ?Topic Date Due  ? TETANUS/TDAP  05/29/2028  ? There are no preventive care reminders to display for this patient.  ?Health Maintenance Due  ?Topic Date Due  ? Zoster Vaccines- Shingrix (1 of 2) Never done  ?  COLONOSCOPY (Pts 45-428yrInsurance coverage will need to be confirmed)  06/16/2015  ?  ? ?Chief Complaint  ?Patient presents with  ? Follow-up  ?  From lung doctor   ?  ? ? ?

## 2021-04-08 ENCOUNTER — Encounter: Payer: Self-pay | Admitting: Family Medicine

## 2021-04-08 DIAGNOSIS — K649 Unspecified hemorrhoids: Secondary | ICD-10-CM | POA: Insufficient documentation

## 2021-04-08 DIAGNOSIS — R159 Full incontinence of feces: Secondary | ICD-10-CM

## 2021-04-08 HISTORY — DX: Full incontinence of feces: R15.9

## 2021-04-08 HISTORY — DX: Unspecified hemorrhoids: K64.9

## 2021-04-08 NOTE — Assessment & Plan Note (Signed)
Established problem ?Likely origin of patient's chronic cough per Dr Ander Slade [facility]) ?Patient consulted with Dr Mamie Nick. Vaughan Browner (Pulm) about her ILD.  ?He felt the ILD was mild.  He plans to follow up patient in 6 months.  ? ? ?

## 2021-04-08 NOTE — Assessment & Plan Note (Signed)
Established problem. Adequate blood pressure control.  No evidence of new end organ damage.  Tolerating medication without significant adverse effects.  Plan to continue current blood pressure medication regiment.   

## 2021-04-08 NOTE — Assessment & Plan Note (Signed)
New complaint ?External palp hemorrhoids for last year or so.  ?Occasional hard scybala but also very soft stools ? ?Taking Sitz baths and using Prep H with pain control  ?Using seat mattress donut.  ? ?PE ?See above ? ?A/ ?Multiple external Hemorrhoids, mildly symptomatic ? ?P/ ?Continue current therapy ?Monitor for worsening for need to refer to [facility] ?

## 2021-04-08 NOTE — Assessment & Plan Note (Signed)
New problem ?Fecal incontinence. ?Having 3 to 4 stools a day.  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  No diarrhea. ?Avoiding going to her her major social activity of going to church for fear of soiling or being unable to suppress flatulence.  ?Wearing adult undergarments ? ?BMs pattern tendency: BM when gets up around 4am to urinate, then about 30 minutes after she eats brfst, then about 30 min after lunch.  ?Avoids going in  ? ?(+) external hemorrhoids ? ?GI specialist Dr Rush Landmark.  Last consult note 11/03/20 documents, "Colon cancer screening. Per Dr. Donneta Romberg EGD procedure notes 08/20/2019 he considered 1 final colonoscopy before ending surveillance/screening due to her age. Colonoscopy in 2012, no polyps." ? ?Physical exam ?Anus with several blue soft varicosities, ranging from 6 to 12 O'clock.  Sizes 1 cm and less. Non-tender.  ?DRE: Poor active sphincter strength, no palp masses. ?Anoscope: No internal hemorrhoids, no masses. Pale mucosa. No erosions, no fissures.  ?Hemoccult (+) ? ?A/ ?Fecal incontinence, chronic ? - Working explanation is combination of frequent bowel movements with poor external anal sphincter strength.   ?P/ ?Referral for Pelvic Rehab ?Slow stool transit time with Imodium: one tablet after early morning BM, then one tab 30 min before bfst and one tab 30 min before lunch.  ?RTC 4 weeks for follow up interventions ? ? ?

## 2021-04-08 NOTE — Assessment & Plan Note (Addendum)
Established problem. ?Stable to slightly improved. ?Two courses of Augmentin since last office visit with me.  ?Anterior rhinorrhea is stable to improved, presumed vasomotor-type.  Using Astelin-Fluticasone NS from Dr Ander Slade [facility])  ? ?Cough mostly first thing in morning with yellow thick mucus that she feels she is able to clear well. ? ? ?

## 2021-04-15 ENCOUNTER — Ambulatory Visit: Payer: Medicare Other | Attending: Family Medicine | Admitting: Physical Therapy

## 2021-04-15 ENCOUNTER — Other Ambulatory Visit: Payer: Self-pay

## 2021-04-15 ENCOUNTER — Encounter: Payer: Self-pay | Admitting: Physical Therapy

## 2021-04-15 DIAGNOSIS — M6281 Muscle weakness (generalized): Secondary | ICD-10-CM | POA: Diagnosis present

## 2021-04-15 DIAGNOSIS — R279 Unspecified lack of coordination: Secondary | ICD-10-CM | POA: Insufficient documentation

## 2021-04-15 DIAGNOSIS — R293 Abnormal posture: Secondary | ICD-10-CM | POA: Insufficient documentation

## 2021-04-15 DIAGNOSIS — R269 Unspecified abnormalities of gait and mobility: Secondary | ICD-10-CM | POA: Diagnosis present

## 2021-04-15 DIAGNOSIS — R159 Full incontinence of feces: Secondary | ICD-10-CM | POA: Diagnosis present

## 2021-04-15 NOTE — Therapy (Signed)
?OUTPATIENT PHYSICAL THERAPY FEMALE PELVIC EVALUATION ? ? ?Patient Name: Selena [name] ?MRN: [id number] ?DOB:1935/09/19, 86 y.o., female ?Today's Date: 04/15/2021 ? ? PT End of Session - 04/15/21 1058   ? ? Visit Number 1   ? Date for PT Re-Evaluation 07/16/21   ? Authorization Type UHC medicare   ? Progress Note Due on Visit 10   ? PT Start Time 1100   ? PT Stop Time 4259   ? PT Time Calculation (min) 43 min   ? Activity Tolerance Patient tolerated treatment well   ? Behavior During Therapy [facility] for tasks assessed/performed   ? ?  ?  ? ?  ? ? ?Past Medical History:  ?Diagnosis Date  ? Acute gastric ulcer with hemorrhage   ? Acute on chronic diastolic congestive heart failure ([place]) 10/16/2018  ? AKI (acute kidney injury) ([place])   ? Anemia   ? PMH  ? Aneurysm (arteriovenous) of coronary vessels   ? Ankle edema, bilateral 01/08/2021  ? Atrial fibrillation ([place]) 01/01/2007  ? BACK PAIN, UPPER 07/09/2008  ? Breast cancer of upper-outer quadrant of right female breast ([place]) 03/21/2011  ? Breast cancer, stage 1 ([place])   ? Bruises easily   ? Cataract   ? history  ? Complication of anesthesia   ? COVID-19 virus infection 03/08/2020  ? Diverticulitis   ? DIVERTICULOSIS, COLON 12/26/2006  ? Essential hypertension 10/27/2017  ? Follicular 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 ([place]) 07/31/2014  ? Follicular non-Hodgkin's lymphoma ([place])   ? Frozen shoulder, Left 01/08/2021  ? Full dentures 01/08/2021  ? Goiter   ? multi-nodular  ? Gustatory rhinitis 01/07/2021  ? History of atrioventricular nodal ablation 10/16/2018  ? Hx of colonoscopy 2009  ? Influenza 12/18/2020  ? Insomnia 09/10/2014  ? LIVER FUNCTION TESTS, ABNORMAL, HX OF 12/26/2007  ? Macular degeneration, bilateral 01/08/2021  ? Memory loss 05/16/2007  ? Nocturnal hypoxemia 05/14/2020  ? OSTEOARTHRITIS 12/26/2006  ? takes Diclofenac daily but has stopped for surgery  ? Osteoarthritis 12/26/2006  ? Qualifier: Diagnosis of  By: Paulina Fusi RN, Daine Gravel   ? Paraspinal  mass 07/16/2014  ? Pneumonia 11/11/2017  ? Pneumonia 01/31/2018  ? Pneumonia due to COVID-19 virus 03/08/2020  ? PONV (postoperative nausea and vomiting)   ? S/P ablation of atrial flutter 07/17/2018  ? Sensorineural hearing loss (SNHL) of both ears 01/07/2021  ? Sepsis ([place]) 04/02/2020  ? Shortness of breath 05/28/2019  ? Skin cancer   ? Syncope 08/21/2018  ? Thoracic aortic aneurysm 10/16/2018  ? Tibialis posterior tendinopathy 04/08/2019  ? Upper airway cough syndrome 10/27/2017  ? pfts 10/10/17 s sign airflow obst though the insp loop is somewhat flattened  - Allergy profile 10/26/2017 >  Eos 0.3 /  IgE  6 RAST neg - rx for gerd 10/26/17 > improved 12/07/2017   ? VERTIGO 09/07/2009  ? Has had Vestibular Rehab consultation  ? Visual impairment 01/07/2021  ? Wears dentures   ? Wears glasses   ? Wears hearing aid   ? bilateral  ? ?Past Surgical History:  ?Procedure Laterality Date  ? ABDOMINAL HYSTERECTOMY    ? ABLATION OF DYSRHYTHMIC FOCUS  2009  ? APPENDECTOMY    ? AUGMENTATION MAMMAPLASTY Right 2013  ? Subpectoral implant  ? BIOPSY  05/07/2019  ? Procedure: BIOPSY;  Surgeon: Irving Copas., MD;  Location[facility];  Service: Gastroenterology;;  ? BREAST BIOPSY  2013  ? BREAST RECONSTRUCTION  04/14/2011  ? Procedure: BREAST RECONSTRUCTION;  Surgeon: Crissie Reese, MD;  Location: [place];  Service: Plastics;  Laterality: Right;  Placement of Right Breast Tissue Expander with  use of Flex HD for Breast Reconstruction  ? BREAST SURGERY    ? right sm, snbx  ? CATARACT EXTRACTION W/ INTRAOCULAR LENS  IMPLANT, BILATERAL  2010  ? bilateral  ? COLONOSCOPY    ? COLONOSCOPY    ? ESOPHAGOGASTRODUODENOSCOPY (EGD) WITH PROPOFOL N/A 05/07/2019  ? Procedure: ESOPHAGOGASTRODUODENOSCOPY (EGD) WITH PROPOFOL;  Surgeon: Rush Landmark Telford Nab., MD;  Location: [facility];  Service: Gastroenterology;  Laterality: N/A;  ? KNEE SURGERY  2004/2010  ? arthroscopic/ bilateral knee replacements  ? LOOP RECORDER IMPLANT    ?  MASTECTOMY Right 2013  ? PORT-A-CATH REMOVAL N/A 12/07/2016  ? Procedure: REMOVAL PORT-A-CATH;  Surgeon: Rolm Bookbinder, MD;  Location: [place];  Service: General;  Laterality: N/A;  ? PORTACATH PLACEMENT Right 08/11/2014  ? Procedure: INSERTION PORT-A-CATH WITH ULTRASOUND;  Surgeon: Rolm Bookbinder, MD;  Location: [place];  Service: General;  Laterality: Right;  ? [place]  ? right  ? tmi  1998  ? TONSILLECTOMY    ? TOTAL KNEE ARTHROPLASTY Bilateral   ? ?Patient Active Problem List  ? Diagnosis Date Noted  ? Incontinence of feces 04/08/2021  ? Hemorrhoids 04/08/2021  ? Restrictive lung disease 04/05/2021  ? Macular degeneration, bilateral 01/08/2021  ? Vasomotor rhinitis 01/08/2021  ? Advanced directives, counseling/discussion 01/08/2021  ? Full dentures 01/08/2021  ? Ankle edema, bilateral 01/08/2021  ? OSA on CPAP 01/07/2021  ? Visual impairment 01/07/2021  ? Sensorineural hearing loss (SNHL) of both ears 01/07/2021  ? Multinodular thyroid recommended annual [place] surveillance 01/07/2021  ? Interstitial lung disease ([place])   ? Falls frequently 04/02/2020  ? Chronic coughing 05/28/2019  ? History of atrioventricular nodal ablation 10/16/2018  ? S/P ablation of atrial flutter 07/17/2018  ? Frozen shoulder, Left 11/11/2017  ? Rhinorrhea 10/27/2017  ? Essential hypertension 10/27/2017  ? Insomnia 09/10/2014  ? Follicular 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 ([place]) 07/31/2014  ? Breast cancer of upper-outer quadrant of right female breast ([place]) 03/21/2011  ? Memory loss 05/16/2007  ? Atrial fibrillation, permanent ([place]) 01/01/2007  ? Osteoarthritis 12/26/2006  ? ? ?PCP: McDiarmid, Blane Ohara, MD ? ?REFERRING PROVIDER: McDiarmid, Blane Ohara, MD ? ?REFERRING DIAG: R15.9 (ICD-10-CM) - Incontinence of feces, unspecified fecal incontinence type ? ?THERAPY DIAG:  ?Incontinence of feces, unspecified fecal incontinence type ? ?ONSET DATE: 2 months ? ?SUBJECTIVE:                                                                                                                                                                                           ? ?  SUBJECTIVE STATEMENT: ?Pt reports she has had fecal leakage for 2 months, reports she has external hemorrhoids. Only has pain with wiping after a BM.  ?Fluid intake: Yes: 2L of water per day   ? ?Patient confirms identification and approves PT to assess pelvic floor and treatment Yes ? ? ?PAIN:  ?Are you having pain? Yes ?NPRS scale: 8/10 ?Pain location: External and Anal ? ?Pain type: "somewhere between sharp and achy" ?Pain description: intermittent  ? ?Aggravating factors: wiping ?Relieving factors: stopping wiping ? ?PRECAUTIONS: None ? ?WEIGHT BEARING RESTRICTIONS No ? ?FALLS:  ?Has patient fallen in last 6 months? No, Number of falls: 0 ? ?LIVING ENVIRONMENT: ?Lives with: lives alone ?Lives in: House/apartment ?Has following equipment at home: Quad cane small base ? ?OCCUPATION: retired ? ?PLOF: Independent help with driving ? ?PATIENT GOALS to have less fecal leakage ? ?PERTINENT HISTORY:  ?OSA on CPAP, CHF, breast cancer, hypertension, non-Hodgkin's lymphoma, chronic A-fib on Eliquis, Thoracic aortic aneurysm, Skin cancer, DIVERTICULOSIS ?Sexual abuse: No ? ?BOWEL MOVEMENT ?Pain with bowel movement: Yes ?Type of bowel movement:Type (Bristol Stool Scale) 5, Frequency varies sometimes 2-4x, and Strain Yes ?Fully empty rectum: No ?Leakage: Yes: only if she can't get to bathroom in time. Doesn't notice leakage if she hurries to bathroom. ~2 mins warning with urge and needing to evacuate.  ?Pads: Yes: changes every day but does not have leakage daily, 1-2x per week will have leakage. No nightly leakage.  ?Fiber supplement: No ? ?URINATION ?Pain with urination: No ?Fully empty bladder: Yes:   ?Stream: Strong ?Urgency: No ?Frequency: no sooner than every 2 hours ?Leakage:  None ?Pads: No ? ?INTERCOURSE ?Pain with intercourse:  Not active ? ? ?PREGNANCY ?Vaginal deliveries  2 ?Tearing No ?Currently pregnant No ? ?PROLAPSE ?None ? ? ? ?OBJECTIVE:  ? ?DIAGNOSTIC FINDINGS:  ? ?COGNITION: ? Overall cognitive status: Within functional limits for tasks assessed   ?  ?SENSATION: ? Light t

## 2021-04-15 NOTE — Patient Instructions (Signed)
Step stool in bathroom ? ? ?

## 2021-04-26 ENCOUNTER — Other Ambulatory Visit: Payer: Self-pay | Admitting: *Deleted

## 2021-04-26 DIAGNOSIS — I4821 Permanent atrial fibrillation: Secondary | ICD-10-CM

## 2021-04-26 MED ORDER — APIXABAN 5 MG PO TABS: ORAL_TABLET | ORAL | 5 refills | Status: DC

## 2021-04-26 NOTE — Telephone Encounter (Signed)
Eliquis '5mg'$  refill request received. Patient is 86 years old, weight-81.3kg, Crea-0.72 on 01/19/2021, Diagnosis-Afib, and last seen by Dr. Angelena Form on 03/05/2021. Dose is appropriate based on dosing criteria. Will send in refill to requested pharmacy.   ?

## 2021-05-04 ENCOUNTER — Ambulatory Visit: Payer: Medicare Other | Attending: Family Medicine | Admitting: Physical Therapy

## 2021-05-04 DIAGNOSIS — R269 Unspecified abnormalities of gait and mobility: Secondary | ICD-10-CM | POA: Diagnosis present

## 2021-05-04 DIAGNOSIS — R159 Full incontinence of feces: Secondary | ICD-10-CM | POA: Insufficient documentation

## 2021-05-04 DIAGNOSIS — M6281 Muscle weakness (generalized): Secondary | ICD-10-CM | POA: Insufficient documentation

## 2021-05-04 DIAGNOSIS — R279 Unspecified lack of coordination: Secondary | ICD-10-CM | POA: Insufficient documentation

## 2021-05-04 DIAGNOSIS — R293 Abnormal posture: Secondary | ICD-10-CM | POA: Diagnosis present

## 2021-05-04 NOTE — Therapy (Signed)
?OUTPATIENT PHYSICAL THERAPY TREATMENT NOTE ? ? ?Patient Name: Selena [name] ?MRN: [id number] ?DOB:[birth year], 86 y.o., female ?Today's Date: 05/04/2021 ? ?PCP: McDiarmid, Blane Ohara, MD ?REFERRING PROVIDER: McDiarmid, Blane Ohara, MD ? ?END OF SESSION:  ? PT End of Session - 05/04/21 1210   ? ? Visit Number 2   ? Date for PT Re-Evaluation 07/16/21   ? Authorization Type UHC medicare   ? Progress Note Due on Visit 10   ? PT Start Time 1210   ? PT Stop Time 1250   ? PT Time Calculation (min) 40 min   ? Activity Tolerance Patient tolerated treatment well   ? Behavior During Therapy [facility] for tasks assessed/performed   ? ?  ?  ? ?  ? ? ?Past Medical History:  ?Diagnosis Date  ? Acute gastric ulcer with hemorrhage   ? Acute on chronic diastolic congestive heart failure ([place]) 10/16/2018  ? AKI (acute kidney injury) ([place])   ? Anemia   ? PMH  ? Aneurysm (arteriovenous) of coronary vessels   ? Ankle edema, bilateral 01/08/2021  ? Atrial fibrillation ([place]) 01/01/2007  ? BACK PAIN, UPPER 07/09/2008  ? Breast cancer of upper-outer quadrant of right female breast ([place]) 03/21/2011  ? Breast cancer, stage 1 ([place])   ? Bruises easily   ? Cataract   ? history  ? Complication of anesthesia   ? COVID-19 virus infection 03/08/2020  ? Diverticulitis   ? DIVERTICULOSIS, COLON 12/26/2006  ? Essential hypertension 10/27/2017  ? 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 Follicular non-Hodgkin's lymphoma ([place])   ? Frozen shoulder, Left 01/08/2021  ? Full dentures 01/08/2021  ? Goiter   ? multi-nodular  ? Gustatory rhinitis 01/07/2021  ? History of atrioventricular nodal ablation 10/16/2018  ? Hx of colonoscopy 2009  ? Influenza 12/18/2020  ? Insomnia 09/10/2014  ? LIVER FUNCTION TESTS, ABNORMAL, HX OF 12/26/2007  ? Macular degeneration, bilateral 01/08/2021  ? Memory loss 05/16/2007  ? Nocturnal hypoxemia 05/14/2020  ? OSTEOARTHRITIS 12/26/2006  ? takes Diclofenac daily but has stopped for surgery  ?  Osteoarthritis 12/26/2006  ? Qualifier: Diagnosis of  By: Paulina Fusi RN, Daine Gravel   ? Paraspinal mass 07/16/2014  ? Pneumonia 11/11/2017  ? Pneumonia 01/31/2018  ? Pneumonia due to COVID-19 virus 03/08/2020  ? PONV (postoperative nausea and vomiting)   ? S/P ablation of atrial flutter 07/17/2018  ? Sensorineural hearing loss (SNHL) of both ears 01/07/2021  ? Sepsis ([place]) 04/02/2020  ? Shortness of breath 05/28/2019  ? Skin cancer   ? Syncope 08/21/2018  ? Thoracic aortic aneurysm 10/16/2018  ? Tibialis posterior tendinopathy 04/08/2019  ? Upper airway cough syndrome 10/27/2017  ? pfts 10/10/17 s sign airflow obst though the insp loop is somewhat flattened  - Allergy profile 10/26/2017 >  Eos 0.3 /  IgE  6 RAST neg - rx for gerd 10/26/17 > improved 12/07/2017   ? VERTIGO 09/07/2009  ? Has had Vestibular Rehab consultation  ? Visual impairment 01/07/2021  ? Wears dentures   ? Wears glasses   ? Wears hearing aid   ? bilateral  ? ?Past Surgical History:  ?Procedure Laterality Date  ? ABDOMINAL HYSTERECTOMY    ? ABLATION OF DYSRHYTHMIC FOCUS  2009  ? APPENDECTOMY    ? AUGMENTATION MAMMAPLASTY Right 2013  ? Subpectoral implant  ? BIOPSY  05/07/2019  ? Procedure: BIOPSY;  Surgeon: Irving Copas., MD;  Location: [facility];  Service: Gastroenterology;;  ? BREAST  BIOPSY  2013  ? BREAST RECONSTRUCTION  04/14/2011  ? Procedure: BREAST RECONSTRUCTION;  Surgeon: Crissie Reese, MD;  Location: [place];  Service: Plastics;  Laterality: Right;  Placement of Right Breast Tissue Expander with  use of Flex HD for Breast Reconstruction  ? BREAST SURGERY    ? right sm, snbx  ? CATARACT EXTRACTION W/ INTRAOCULAR LENS  IMPLANT, BILATERAL  2010  ? bilateral  ? COLONOSCOPY    ? COLONOSCOPY    ? ESOPHAGOGASTRODUODENOSCOPY (EGD) WITH PROPOFOL N/A 05/07/2019  ? Procedure: ESOPHAGOGASTRODUODENOSCOPY (EGD) WITH PROPOFOL;  Surgeon: Rush Landmark Telford Nab., MD;  Location[facility];  Service: Gastroenterology;  Laterality: N/A;  ? KNEE SURGERY   2004/2010  ? arthroscopic/ bilateral knee replacements  ? LOOP RECORDER IMPLANT    ? MASTECTOMY Right 2013  ? PORT-A-CATH REMOVAL N/A 12/07/2016  ? Procedure: REMOVAL PORT-A-CATH;  Surgeon: Rolm Bookbinder, MD;  Location: [place];  Service: General;  Laterality: N/A;  ? PORTACATH PLACEMENT Right 08/11/2014  ? Procedure: INSERTION PORT-A-CATH WITH ULTRASOUND;  Surgeon: Rolm Bookbinder, MD;  Location: [place];  Service: General;  Laterality: Right;  ? [place]  ? right  ? tmi  1998  ? TONSILLECTOMY    ? TOTAL KNEE ARTHROPLASTY Bilateral   ? ?Patient Active Problem List  ? Diagnosis Date Noted  ? Incontinence of feces 04/08/2021  ? Hemorrhoids 04/08/2021  ? Restrictive lung disease 04/05/2021  ? Macular degeneration, bilateral 01/08/2021  ? Vasomotor rhinitis 01/08/2021  ? Advanced directives, counseling/discussion 01/08/2021  ? Full dentures 01/08/2021  ? Ankle edema, bilateral 01/08/2021  ? OSA on CPAP 01/07/2021  ? Visual impairment 01/07/2021  ? Sensorineural hearing loss (SNHL) of both ears 01/07/2021  ? Multinodular thyroid recommended annual [place] surveillance 01/07/2021  ? Interstitial lung disease ([place])   ? Falls frequently 04/02/2020  ? Chronic coughing 05/28/2019  ? History of atrioventricular nodal ablation 10/16/2018  ? S/P ablation of atrial flutter 07/17/2018  ? Frozen shoulder, Left 11/11/2017  ? Rhinorrhea 10/27/2017  ? Essential hypertension 10/27/2017  ? Insomnia 09/10/2014  ? Follicular 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 ([place]) 07/31/2014  ? Breast cancer of upper-outer quadrant of right female breast ([place]) 03/21/2011  ? Memory loss 05/16/2007  ? Atrial fibrillation, permanent ([place]) 01/01/2007  ? Osteoarthritis 12/26/2006  ? ? ?REFERRING DIAG: R15.9 (ICD-10-CM) - Incontinence of feces, unspecified fecal incontinence type ? ?THERAPY DIAG:  ?Muscle weakness (generalized) ? ?Abnormal posture ? ?Full incontinence of feces ? ?PERTINENT HISTORY: OSA on CPAP, CHF, breast cancer,  hypertension, non-Hodgkin's lymphoma, chronic A-fib on Eliquis, Thoracic aortic aneurysm, Skin cancer, DIVERTICULOSIS ?Sexual abuse: No ? ?PRECAUTIONS: None ? ?SUBJECTIVE: Pt reports she has been using the squatty potty at home and this has helped with emptying and has no longer having to strain or push. Pt reports she had one instance of fecal incontinence very small amount and she was unaware this had happened.  ? ?PAIN:  ?Are you having pain? No ? ? ? ?OBJECTIVE:  ?  ?DIAGNOSTIC FINDINGS:  ?  ?COGNITION: ?           Overall cognitive status: Within functional limits for tasks assessed              ?            ?SENSATION: ?           Light touch: Appears intact ?           Proprioception: Appears intact ?  ?  MUSCLE LENGTH: ?Bil hamstrings limited by 75%  and adductors limited by 50% ?  ?  ?FUNCTIONAL TESTS:  ?Squat: unable to achieve without UE support, 25% descent, and holding breath to return to standing ?Sit to stand min A without arm from low height ?GAIT: ?Distance walked: 150' ?Assistive device utilized: Quad cane small base ?Level of assistance: Modified independence ?Comments: poor cadence, decreased hip flexion and bil foot clearance, decreased arm swing and trunk rotation noted ?  ?POSTURE:  ?Rounded shoulders, mild kyphosis ?  ?LUMBARAROM/PROM ?  ?Lumbar flexion limited by 50%, extension WFL, side bending limited by 75%, rotation limited by 50% ?  ?LE ROM: ?  ?Bil hip IR and ER limited by 50% ?  ?LE MMT: ?  ?Bil hip abduction 2+/5; all others 3/5 ?  ?PELVIC MMT: ?  ?MMT   ?04/15/2021  ?Vaginal    ?Internal Anal Sphincter 1/5  ?External Anal Sphincter 2/5  ?Puborectalis 1/5  ?Diastasis Recti    ?(Blank rows = not tested) ?  ?      PALPATION: ?  General  TTP at Rt abdominal quadrant ?  ?              External Perineal Exam external hemorrhoids  ?              ?              Internal Pelvic Floor rectally assessed initially with demonstration of downward push with attempts to contract and mild tightening  with attempts to bulge. Extra time spent to cue proper technique for breathing and pelvic floor mobility with improved strength noted to above readings, without cues 0/5 and poor coordination noted.  ?  ?TONE: ?Tennis Must

## 2021-05-06 ENCOUNTER — Ambulatory Visit: Payer: Medicare Other | Admitting: Family Medicine

## 2021-05-06 ENCOUNTER — Encounter: Payer: Self-pay | Admitting: Family Medicine

## 2021-05-06 VITALS — BP 125/73 | HR 69 | Ht 67.0 in | Wt 178.2 lb

## 2021-05-06 DIAGNOSIS — J300 Vasomotor rhinitis: Secondary | ICD-10-CM

## 2021-05-06 DIAGNOSIS — N814 Uterovaginal prolapse, unspecified: Secondary | ICD-10-CM

## 2021-05-06 DIAGNOSIS — R053 Chronic cough: Secondary | ICD-10-CM

## 2021-05-06 MED ORDER — GUAIFENESIN-CODEINE 100-10 MG/5ML PO SOLN: 2.5000 mL | Freq: Three times a day (TID) | ORAL | 0 refills | Status: DC | PRN

## 2021-05-06 MED ORDER — AZELASTINE-FLUTICASONE 137-50 MCG/ACT NA SUSP: 1.0000 | Freq: Every day | NASAL | 12 refills | Status: DC

## 2021-05-06 MED ORDER — IPRATROPIUM BROMIDE 0.06 % NA SOLN: 2.0000 | Freq: Four times a day (QID) | NASAL | 12 refills | Status: DC

## 2021-05-06 NOTE — Patient Instructions (Signed)
Please restart taking the two nasal sprays, ipratropium spray and the astelin-fluticasone spray.  This will help dry up your nose dripping out the front of you nose and back down your throat.  ? ?I am at a loss to help you with you cough.  I seem left with just the option of using codeine in Robitusin AC.  You may have had hallucinations of bugs in the past with codeine, but hopefully with a low dose we can control your cough without causing hallucinations. ? ?Please let your family know you are trying the codeine cough medicine.  They may want to check in on you more frequently. ? ?Should you develop hallucinations or any other side effects, please let Dr Rogue Pautler know.   ? ?If the codeine cough medicine works to suppres your cough for a few hours, then use it only if you need to suppress your cough in public, like going to church.  ? ? ?

## 2021-05-07 ENCOUNTER — Encounter: Payer: Self-pay | Admitting: Family Medicine

## 2021-05-07 NOTE — Assessment & Plan Note (Addendum)
Established problem ?Uncontrolled.  Patient is not at goal of decrease in frequency of coughing to point she may attend church and other social activities. Her cough continues to be worse in morning with her brining up yellow mucus. Her later cough usually is productive of a clear mucus or non-productive. ?Patient tells me that she did not improve with recent courses of Augmentin, but prior notes record that she did.  ? ?Ms Fruchter has several possible contributors to her chronic cough including ILD and persistent rhinorrhea. Recent Pulmonary consultation with Dr Vaughan Browner.  He will monitor her ILD. ? ?Plan ?Treat persistent rhinorrhea by restarting prior prescriptions that provided some relief. ?After shared decision making, that included reminder that Ms Bickle allery/contrainidcation list included codeine causing hallucinations of bugs and her added information that she has had l"low doses" of codeine-containing cough syrup from her last PCP without hallucinations, it was decided to try a trial of Robitusin AC 2.5 to 5 mL q4 hour prn.  We agreed that if it is helpful without significant adverse effects, then she would use it only to allow her to attend social activities, e.g., church services.   ? ?Advised Ms Nordgren to contact my office should she experience any adverse effects from the Robitusin AC. ?RTC s4-5 weeks.  ? ? ? ?

## 2021-05-07 NOTE — Progress Notes (Signed)
Selena [name] is alone ?Sources of clinical information for visit is/are patient. ?Nursing assessment for this office visit was reviewed with the patient for accuracy and revision.  ? ? ? ?Previous Report(s) Reviewed: office notes  ? ?  05/06/2021  ? 10:54 AM  ?Depression screen PHQ 2/9  ?Decreased Interest 1  ?Down, Depressed, Hopeless 0  ?PHQ - 2 Score 1  ?Altered sleeping 1  ?Tired, decreased energy 3  ?Change in appetite 0  ?Feeling bad or failure about yourself  3  ?Trouble concentrating 0  ?Moving slowly or fidgety/restless 0  ?Suicidal thoughts 0  ?PHQ-9 Score 8  ?Difficult doing work/chores Somewhat difficult  ? ?[place] Office Visit from 05/06/2021 in [place] Office Visit from 03/16/2021 in [place] Office Visit from 01/07/2021 in [place]  ?Thoughts that you would be better off dead, or of hurting yourself in some way Not at all Not at all Not at all  ?PHQ-9 Total Score '8 11 6  '$ ? ?  ?  ? ?  05/06/2021  ? 10:54 AM 04/07/2021  ?  9:54 AM 03/16/2021  ?  1:26 PM 01/07/2021  ?  1:55 PM 11/11/2020  ?  3:11 PM  ?Fall Risk   ?Falls in the past year? 0 0 0 1 0  ?Number falls in past yr: 0 0 0 0 0  ?Injury with Fall? 0 0  1 0  ?Risk for fall due to :     Impaired vision;Impaired balance/gait  ?Follow up     Falls prevention discussed  ? ? ? ?  05/06/2021  ? 10:54 AM 03/16/2021  ?  1:27 PM 01/07/2021  ?  1:56 PM  ?PHQ9 SCORE ONLY  ?PHQ-9 Total Score '8 11 6  '$ ? ? ?Adult vaccines due  ?Topic Date Due  ? TETANUS/TDAP  05/29/2028  ? ? ?Health Maintenance Due  ?Topic Date Due  ? Zoster Vaccines- Shingrix (1 of 2) Never done  ? COLONOSCOPY (Pts 45-55yr Insurance coverage will need to be confirmed)  06/16/2015  ?  ? ? ?History/P.E. limitations: memory decline ? ?Adult vaccines due  ?Topic Date Due  ? TETANUS/TDAP  05/29/2028  ? There are no preventive care reminders to display for this patient.  ?Health Maintenance Due  ?Topic Date Due  ? Zoster  Vaccines- Shingrix (1 of 2) Never done  ? COLONOSCOPY (Pts 45-439yrInsurance coverage will need to be confirmed)  06/16/2015  ?  ? ?Chief Complaint  ?Patient presents with  ? Cough  ? Nasal Congestion  ? Fecal incontience   ?  ? ?

## 2021-05-07 NOTE — Assessment & Plan Note (Addendum)
Established problem worsened.  ?Selena [name] says she is not using the previously prescibed NS for this condition. I suspect her memory impairment is a major contributor to her difficulty with prescribed regiment.  ?Patient is not at goal of lessened anterior and posterior nasal drainage ?Start: Restart Astelin-Floticasone combination NS and Restart nasal ipratropium spray  ?She has had some success without significant adverse effects of chlorpheniramine 4 mg tablets.   ?RTC May 18 ? ?May be good idea for referral to Allery/Immun if unable to provide satisfactory relief ? ?

## 2021-05-11 ENCOUNTER — Ambulatory Visit: Payer: Medicare Other | Admitting: Physical Therapy

## 2021-05-11 DIAGNOSIS — R279 Unspecified lack of coordination: Secondary | ICD-10-CM

## 2021-05-11 DIAGNOSIS — R293 Abnormal posture: Secondary | ICD-10-CM

## 2021-05-11 DIAGNOSIS — M6281 Muscle weakness (generalized): Secondary | ICD-10-CM

## 2021-05-11 NOTE — Therapy (Signed)
?OUTPATIENT PHYSICAL THERAPY TREATMENT NOTE ? ? ?Patient Name: Selena [name] ?MRN: [id number] ?DOB:[birth year], 86 y.o., female ?Today's Date: 05/11/2021 ? ?PCP: McDiarmid, Blane Ohara, MD ?REFERRING PROVIDER: McDiarmid, Blane Ohara, MD ? ?END OF SESSION:  ? PT End of Session - 05/11/21 1152   ? ? Visit Number 3   ? Date for PT Re-Evaluation 07/16/21   ? Authorization Type UHC medicare   ? Progress Note Due on Visit 10   ? PT Start Time 1145   ? PT Stop Time 7793   ? PT Time Calculation (min) 44 min   ? Activity Tolerance Patient tolerated treatment well   ? Behavior During Therapy [facility] for tasks assessed/performed   ? ?  ?  ? ?  ? ? ?Past Medical History:  ?Diagnosis Date  ? Acute gastric ulcer with hemorrhage   ? Acute on chronic diastolic congestive heart failure ([place]) 10/16/2018  ? AKI (acute kidney injury) ([place])   ? Anemia   ? PMH  ? Aneurysm (arteriovenous) of coronary vessels   ? Ankle edema, bilateral 01/08/2021  ? Atrial fibrillation ([place]) 01/01/2007  ? BACK PAIN, UPPER 07/09/2008  ? Breast cancer of upper-outer quadrant of right female breast ([place]) 03/21/2011  ? Breast cancer, stage 1 ([place])   ? Bruises easily   ? Cataract   ? history  ? Complication of anesthesia   ? COVID-19 virus infection 03/08/2020  ? Diverticulitis   ? DIVERTICULOSIS, COLON 12/26/2006  ? Essential hypertension 10/27/2017  ? Follicular 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 ([place]) 07/31/2014  ? Follicular non-Hodgkin's lymphoma ([place])   ? Frozen shoulder, Left 01/08/2021  ? Full dentures 01/08/2021  ? Goiter   ? multi-nodular  ? Gustatory rhinitis 01/07/2021  ? History of atrioventricular nodal ablation 10/16/2018  ? Hx of colonoscopy 2009  ? Influenza 12/18/2020  ? Insomnia 09/10/2014  ? LIVER FUNCTION TESTS, ABNORMAL, HX OF 12/26/2007  ? Macular degeneration, bilateral 01/08/2021  ? Memory loss 05/16/2007  ? Nocturnal hypoxemia 05/14/2020  ? OSTEOARTHRITIS 12/26/2006  ? takes Diclofenac daily but has stopped for surgery  ?  Osteoarthritis 12/26/2006  ? Qualifier: Diagnosis of  By: Paulina Fusi RN, Daine Gravel   ? Paraspinal mass 07/16/2014  ? Pneumonia 11/11/2017  ? Pneumonia 01/31/2018  ? Pneumonia due to COVID-19 virus 03/08/2020  ? PONV (postoperative nausea and vomiting)   ? S/P ablation of atrial flutter 07/17/2018  ? Sensorineural hearing loss (SNHL) of both ears 01/07/2021  ? Sepsis ([place]) 04/02/2020  ? Shortness of breath 05/28/2019  ? Skin cancer   ? Syncope 08/21/2018  ? Thoracic aortic aneurysm ([place]) 10/16/2018  ? Tibialis posterior tendinopathy 04/08/2019  ? Upper airway cough syndrome 10/27/2017  ? pfts 10/10/17 s sign airflow obst though the insp loop is somewhat flattened  - Allergy profile 10/26/2017 >  Eos 0.3 /  IgE  6 RAST neg - rx for gerd 10/26/17 > improved 12/07/2017   ? VERTIGO 09/07/2009  ? Has had Vestibular Rehab consultation  ? Visual impairment 01/07/2021  ? Wears dentures   ? Wears glasses   ? Wears hearing aid   ? bilateral  ? ?Past Surgical History:  ?Procedure Laterality Date  ? ABDOMINAL HYSTERECTOMY    ? ABLATION OF DYSRHYTHMIC FOCUS  2009  ? APPENDECTOMY    ? AUGMENTATION MAMMAPLASTY Right 2013  ? Subpectoral implant  ? BIOPSY  05/07/2019  ? Procedure: BIOPSY;  Surgeon: Irving Copas., MD;  Location: [facility];  Service: Gastroenterology;;  ?  BREAST BIOPSY  2013  ? BREAST RECONSTRUCTION  04/14/2011  ? Procedure: BREAST RECONSTRUCTION;  Surgeon: Crissie Reese, MD;  Location: [facility];  Service: Plastics;  Laterality: Right;  Placement of Right Breast Tissue Expander with  use of Flex HD for Breast Reconstruction  ? BREAST SURGERY    ? right sm, snbx  ? CATARACT EXTRACTION W/ INTRAOCULAR LENS  IMPLANT, BILATERAL  2010  ? bilateral  ? COLONOSCOPY    ? COLONOSCOPY    ? ESOPHAGOGASTRODUODENOSCOPY (EGD) WITH PROPOFOL N/A 05/07/2019  ? Procedure: ESOPHAGOGASTRODUODENOSCOPY (EGD) WITH PROPOFOL;  Surgeon: Rush Landmark Telford Nab., MD;  Location: [facility];  Service: Gastroenterology;  Laterality: N/A;  ? KNEE  SURGERY  2004/2010  ? arthroscopic/ bilateral knee replacements  ? LOOP RECORDER IMPLANT    ? MASTECTOMY Right 2013  ? PORT-A-CATH REMOVAL N/A 12/07/2016  ? Procedure: REMOVAL PORT-A-CATH;  Surgeon: Rolm Bookbinder, MD;  Location: [place];  Service: General;  Laterality: N/A;  ? PORTACATH PLACEMENT Right 08/11/2014  ? Procedure: INSERTION PORT-A-CATH WITH ULTRASOUND;  Surgeon: Rolm Bookbinder, MD;  Location: [place];  Service: General;  Laterality: Right;  ? [place]  ? right  ? tmi  1998  ? TONSILLECTOMY    ? TOTAL KNEE ARTHROPLASTY Bilateral   ? ?Patient Active Problem List  ? Diagnosis Date Noted  ? Incontinence of feces 04/08/2021  ? Hemorrhoids 04/08/2021  ? Restrictive lung disease 04/05/2021  ? Macular degeneration, bilateral 01/08/2021  ? Vasomotor rhinitis 01/08/2021  ? Advanced directives, counseling/discussion 01/08/2021  ? Full dentures 01/08/2021  ? Ankle edema, bilateral 01/08/2021  ? OSA on CPAP 01/07/2021  ? Visual impairment 01/07/2021  ? Sensorineural hearing loss (SNHL) of both ears 01/07/2021  ? Multinodular thyroid recommended annual [place] surveillance 01/07/2021  ? Interstitial lung disease ([place])   ? Falls frequently 04/02/2020  ? Chronic coughing 05/28/2019  ? History of atrioventricular nodal ablation 10/16/2018  ? S/P ablation of atrial flutter 07/17/2018  ? Frozen shoulder, Left 11/11/2017  ? Rhinorrhea 10/27/2017  ? Essential hypertension 10/27/2017  ? Insomnia 09/10/2014  ? Follicular 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 ([place]) 07/31/2014  ? Breast cancer of upper-outer quadrant of right female breast ([place]) 03/21/2011  ? Memory loss 05/16/2007  ? Atrial fibrillation, permanent ([place]) 01/01/2007  ? Osteoarthritis 12/26/2006  ? ? ?REFERRING DIAG: R15.9 (ICD-10-CM) - Incontinence of feces, unspecified fecal incontinence type ? ?THERAPY DIAG:  ?Muscle weakness (generalized) ? ?Unspecified lack of coordination ? ?Abnormal posture ? ?PERTINENT HISTORY: OSA on CPAP, CHF,  breast cancer, hypertension, non-Hodgkin's lymphoma, chronic A-fib on Eliquis, Thoracic aortic aneurysm, Skin cancer, DIVERTICULOSIS ?Sexual abuse: No ? ?PRECAUTIONS: None ? ?SUBJECTIVE: Pt reports she continued to have no leakage since eval, had some leakage liquid in consistency after having diarrhea Sunday no leakage since this. Pt reports overall 1-3x BMs per day.  ? ?PAIN:  ?Are you having pain? No ? ? ? ?OBJECTIVE:  ?  ?DIAGNOSTIC FINDINGS:  ?  ?COGNITION: ?           Overall cognitive status: Within functional limits for tasks assessed              ?            ?SENSATION: ?           Light touch: Appears intact ?           Proprioception: Appears intact ?  ?MUSCLE LENGTH: ?Bil hamstrings limited by 75%  and adductors limited by  50% ?  ?  ?FUNCTIONAL TESTS:  ?Squat: unable to achieve without UE support, 25% descent, and holding breath to return to standing ?Sit to stand min A without arm from low height ?GAIT: ?Distance walked: 150' ?Assistive device utilized: Quad cane small base ?Level of assistance: Modified independence ?Comments: poor cadence, decreased hip flexion and bil foot clearance, decreased arm swing and trunk rotation noted ?  ?POSTURE:  ?Rounded shoulders, mild kyphosis ?  ?LUMBARAROM/PROM ?  ?Lumbar flexion limited by 50%, extension WFL, side bending limited by 75%, rotation limited by 50% ?  ?LE ROM: ?  ?Bil hip IR and ER limited by 50% ?  ?LE MMT: ?  ?Bil hip abduction 2+/5; all others 3/5 ?  ?PELVIC MMT: ?  ?MMT   ?04/15/2021 05/11/2021   ?Vaginal     ?Internal Anal Sphincter 1/5   ?External Anal Sphincter 2/5 3/5; 10s; 4 reps  ?Puborectalis 1/5   ?Diastasis Recti     ?(Blank rows = not tested) ?  ?      PALPATION: ?  General  TTP at Rt abdominal quadrant ?  ?              External Perineal Exam external hemorrhoids  ? ? ?              ?              Internal Pelvic Floor rectally assessed initially with demonstration of downward push with attempts to contract and mild tightening with  attempts to bulge. Extra time spent to cue proper technique for breathing and pelvic floor mobility with improved strength noted to above readings, without cues 0/5 and poor coordination noted.  ?  ?TONE: ?Decreased  ?  ?

## 2021-05-15 ENCOUNTER — Other Ambulatory Visit: Payer: Self-pay | Admitting: Family Medicine

## 2021-05-18 ENCOUNTER — Ambulatory Visit: Payer: Medicare Other | Admitting: Physical Therapy

## 2021-05-18 DIAGNOSIS — R279 Unspecified lack of coordination: Secondary | ICD-10-CM

## 2021-05-18 DIAGNOSIS — M6281 Muscle weakness (generalized): Secondary | ICD-10-CM | POA: Diagnosis not present

## 2021-05-18 DIAGNOSIS — R269 Unspecified abnormalities of gait and mobility: Secondary | ICD-10-CM

## 2021-05-18 DIAGNOSIS — R293 Abnormal posture: Secondary | ICD-10-CM

## 2021-05-18 NOTE — Patient Instructions (Signed)
Types of Fiber  There are two main types of fiber:  insoluble and soluble.  Both of these types can prevent and relieve constipation and diarrhea, although some people find one or the other to be more easily digested.  This handout details information about both types of fiber.  Insoluble Fiber       Functions of Insoluble Fiber . moves bulk through the intestines  . controls and balances the pH (acidity) in the intestines       Benefits of Insoluble Fiber . promotes regular bowel movement and prevents constipation  . removes fecal waste through colon in less time  . keeps an optimal pH in intestines to prevent microbes from producing cancer substances, therefore preventing colon cancer        Food Sources of Insoluble Fiber . whole-wheat products  . wheat bran "miller's bran" . corn bran  . flax seed or other seeds . vegetables such as green beans, broccoli, cauliflower and potato skins  . fruit skins and root vegetable skins  . popcorn . brown rice  Soluble Fiber       Functions of Soluble Fiber  . holds water in the colon to bulk and soften the stool . prolongs stomach emptying time so that sugar is released and absorbed more slowly        Benefits of Soluble Fiber . lowers total cholesterol and LDL cholesterol (the bad cholesterol) therefore reducing the risk of heart disease  . regulates blood sugar for people with diabetes       Food Sources of Soluble Fiber . oat/oat bran . dried beans and peas  . nuts  . barley  . flax seed or other seeds . fruits such as oranges, pears, peaches, and apples  . vegetables such as carrots  . psyllium husk  . prunes  

## 2021-05-18 NOTE — Therapy (Signed)
?OUTPATIENT PHYSICAL THERAPY TREATMENT NOTE ? ? ?Patient Name: Selena [name] ?MRN: [id number] ?DOB:August 20, 1935, 86 y.o., female ?Today's Date: 05/18/2021 ? ?PCP: McDiarmid, Blane Ohara, MD ?REFERRING PROVIDER: McDiarmid, Blane Ohara, MD ? ?END OF SESSION:  ? PT End of Session - 05/18/21 1226   ? ? Visit Number 4   ? Date for PT Re-Evaluation 07/16/21   ? Authorization Type UHC medicare   ? Progress Note Due on Visit 10   ? PT Start Time 1145   ? PT Stop Time 1093   ? PT Time Calculation (min) 39 min   ? Activity Tolerance Patient tolerated treatment well   ? Behavior During Therapy [facility] for tasks assessed/performed   ? ?  ?  ? ?  ? ? ? ?Past Medical History:  ?Diagnosis Date  ? Acute gastric ulcer with hemorrhage   ? Acute on chronic diastolic congestive heart failure ([place]) 10/16/2018  ? AKI (acute kidney injury) ([place])   ? Anemia   ? PMH  ? Aneurysm (arteriovenous) of coronary vessels   ? Ankle edema, bilateral 01/08/2021  ? Atrial fibrillation ([place]) 01/01/2007  ? BACK PAIN, UPPER 07/09/2008  ? Breast cancer of upper-outer quadrant of right female breast ([place]) 03/21/2011  ? Breast cancer, stage 1 ([place])   ? Bruises easily   ? Cataract   ? history  ? Complication of anesthesia   ? COVID-19 virus infection 03/08/2020  ? Diverticulitis   ? DIVERTICULOSIS, COLON 12/26/2006  ? Essential hypertension 10/27/2017  ? Follicular 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 ([place]) 07/31/2014  ? Follicular non-Hodgkin's lymphoma ([place])   ? Frozen shoulder, Left 01/08/2021  ? Full dentures 01/08/2021  ? Goiter   ? multi-nodular  ? Gustatory rhinitis 01/07/2021  ? History of atrioventricular nodal ablation 10/16/2018  ? Hx of colonoscopy 2009  ? Influenza 12/18/2020  ? Insomnia 09/10/2014  ? LIVER FUNCTION TESTS, ABNORMAL, HX OF 12/26/2007  ? Macular degeneration, bilateral 01/08/2021  ? Memory loss 05/16/2007  ? Nocturnal hypoxemia 05/14/2020  ? OSTEOARTHRITIS 12/26/2006  ? takes Diclofenac daily but has stopped for surgery  ?  Osteoarthritis 12/26/2006  ? Qualifier: Diagnosis of  By: Paulina Fusi RN, Daine Gravel   ? Paraspinal mass 07/16/2014  ? Pneumonia 11/11/2017  ? Pneumonia 01/31/2018  ? Pneumonia due to COVID-19 virus 03/08/2020  ? PONV (postoperative nausea and vomiting)   ? S/P ablation of atrial flutter 07/17/2018  ? Sensorineural hearing loss (SNHL) of both ears 01/07/2021  ? Sepsis ([place]) 04/02/2020  ? Shortness of breath 05/28/2019  ? Skin cancer   ? Syncope 08/21/2018  ? Thoracic aortic aneurysm ([place]) 10/16/2018  ? Tibialis posterior tendinopathy 04/08/2019  ? Upper airway cough syndrome 10/27/2017  ? pfts 10/10/17 s sign airflow obst though the insp loop is somewhat flattened  - Allergy profile 10/26/2017 >  Eos 0.3 /  IgE  6 RAST neg - rx for gerd 10/26/17 > improved 12/07/2017   ? VERTIGO 09/07/2009  ? Has had Vestibular Rehab consultation  ? Visual impairment 01/07/2021  ? Wears dentures   ? Wears glasses   ? Wears hearing aid   ? bilateral  ? ?Past Surgical History:  ?Procedure Laterality Date  ? ABDOMINAL HYSTERECTOMY    ? ABLATION OF DYSRHYTHMIC FOCUS  2009  ? APPENDECTOMY    ? AUGMENTATION MAMMAPLASTY Right 2013  ? Subpectoral implant  ? BIOPSY  05/07/2019  ? Procedure: BIOPSY;  Surgeon: Irving Copas., MD;  Location: [facility];  Service: Gastroenterology;;  ?  BREAST BIOPSY  2013  ? BREAST RECONSTRUCTION  04/14/2011  ? Procedure: BREAST RECONSTRUCTION;  Surgeon: Crissie Reese, MD;  Location: [facility];  Service: Plastics;  Laterality: Right;  Placement of Right Breast Tissue Expander with  use of Flex HD for Breast Reconstruction  ? BREAST SURGERY    ? right sm, snbx  ? CATARACT EXTRACTION W/ INTRAOCULAR LENS  IMPLANT, BILATERAL  2010  ? bilateral  ? COLONOSCOPY    ? COLONOSCOPY    ? ESOPHAGOGASTRODUODENOSCOPY (EGD) WITH PROPOFOL N/A 05/07/2019  ? Procedure: ESOPHAGOGASTRODUODENOSCOPY (EGD) WITH PROPOFOL;  Surgeon: Rush Landmark Telford Nab., MD;  Location: [facility];  Service: Gastroenterology;  Laterality: N/A;  ? KNEE  SURGERY  2004/2010  ? arthroscopic/ bilateral knee replacements  ? LOOP RECORDER IMPLANT    ? MASTECTOMY Right 2013  ? PORT-A-CATH REMOVAL N/A 12/07/2016  ? Procedure: REMOVAL PORT-A-CATH;  Surgeon: Rolm Bookbinder, MD;  Location: [place];  Service: General;  Laterality: N/A;  ? PORTACATH PLACEMENT Right 08/11/2014  ? Procedure: INSERTION PORT-A-CATH WITH ULTRASOUND;  Surgeon: Rolm Bookbinder, MD;  Location: [facility];  Service: General;  Laterality: Right;  ? [place]  ? right  ? tmi  1998  ? TONSILLECTOMY    ? TOTAL KNEE ARTHROPLASTY Bilateral   ? ?Patient Active Problem List  ? Diagnosis Date Noted  ? Incontinence of feces 04/08/2021  ? Hemorrhoids 04/08/2021  ? Restrictive lung disease 04/05/2021  ? Macular degeneration, bilateral 01/08/2021  ? Vasomotor rhinitis 01/08/2021  ? Advanced directives, counseling/discussion 01/08/2021  ? Full dentures 01/08/2021  ? Ankle edema, bilateral 01/08/2021  ? OSA on CPAP 01/07/2021  ? Visual impairment 01/07/2021  ? Sensorineural hearing loss (SNHL) of both ears 01/07/2021  ? Multinodular thyroid recommended annual [place] surveillance 01/07/2021  ? Interstitial lung disease ([place])   ? Falls frequently 04/02/2020  ? Chronic coughing 05/28/2019  ? History of atrioventricular nodal ablation 10/16/2018  ? S/P ablation of atrial flutter 07/17/2018  ? Frozen shoulder, Left 11/11/2017  ? Rhinorrhea 10/27/2017  ? Essential hypertension 10/27/2017  ? Insomnia 09/10/2014  ? Follicular 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 ([place]) 07/31/2014  ? Breast cancer of upper-outer quadrant of right female breast ([place]) 03/21/2011  ? Memory loss 05/16/2007  ? Atrial fibrillation, permanent ([place]) 01/01/2007  ? Osteoarthritis 12/26/2006  ? ? ?REFERRING DIAG: R15.9 (ICD-10-CM) - Incontinence of feces, unspecified fecal incontinence type ? ?THERAPY DIAG:  ?Abnormal posture ? ?Abnormality of gait and mobility ? ?Unspecified lack of coordination ? ?PERTINENT HISTORY: OSA on CPAP,  CHF, breast cancer, hypertension, non-Hodgkin's lymphoma, chronic A-fib on Eliquis, Thoracic aortic aneurysm, Skin cancer, DIVERTICULOSIS ?Sexual abuse: No ? ?PRECAUTIONS: None ? ?SUBJECTIVE: Pt reports she feels better now but has a few instances of leakage/spotting of stool 2-3x all after BMs and liquid consistency last week. Pt reports she woke up this morning at 2am with stomach cramping and needing to have BM and had 3 between 2-4am. Hasn't had anymore since then and those were small varied consistencies.  ? ?PAIN:  ?Are you having pain? No ? ? ? ?OBJECTIVE:  ?  ?DIAGNOSTIC FINDINGS:  ?  ?COGNITION: ?           Overall cognitive status: Within functional limits for tasks assessed              ?            ?SENSATION: ?           Light touch: Appears intact ?  Proprioception: Appears intact ?  ?MUSCLE LENGTH: ?Bil hamstrings limited by 75%  and adductors limited by 50% ?  ?  ?FUNCTIONAL TESTS:  ?Squat: unable to achieve without UE support, 25% descent, and holding breath to return to standing ?Sit to stand min A without arm from low height ?GAIT: ?Distance walked: 150' ?Assistive device utilized: Quad cane small base ?Level of assistance: Modified independence ?Comments: poor cadence, decreased hip flexion and bil foot clearance, decreased arm swing and trunk rotation noted ?  ?POSTURE:  ?Rounded shoulders, mild kyphosis ?  ?LUMBARAROM/PROM ?  ?Lumbar flexion limited by 50%, extension WFL, side bending limited by 75%, rotation limited by 50% ?  ?LE ROM: ?  ?Bil hip IR and ER limited by 50% ?  ?LE MMT: ?  ?Bil hip abduction 2+/5; all others 3/5 ?  ?PELVIC MMT: ?  ?MMT   ?04/15/2021 05/11/2021   ?Vaginal     ?Internal Anal Sphincter 1/5   ?External Anal Sphincter 2/5 3/5; 10s; 4 reps  ?Puborectalis 1/5   ?Diastasis Recti     ?(Blank rows = not tested) ?  ?      PALPATION: ?  General  TTP at Rt abdominal quadrant ?  ?              External Perineal Exam external hemorrhoids  ? ? ?              ?               Internal Pelvic Floor rectally assessed initially with demonstration of downward push with attempts to contract and mild tightening with attempts to bulge. Extra time spent to cue proper technique for breathing an

## 2021-05-23 ENCOUNTER — Other Ambulatory Visit: Payer: Self-pay | Admitting: Nurse Practitioner

## 2021-05-24 ENCOUNTER — Other Ambulatory Visit: Payer: Self-pay | Admitting: Nurse Practitioner

## 2021-05-25 ENCOUNTER — Ambulatory Visit: Payer: Medicare Other | Attending: Family Medicine | Admitting: Physical Therapy

## 2021-05-25 DIAGNOSIS — M6281 Muscle weakness (generalized): Secondary | ICD-10-CM | POA: Insufficient documentation

## 2021-05-25 DIAGNOSIS — R279 Unspecified lack of coordination: Secondary | ICD-10-CM | POA: Insufficient documentation

## 2021-05-25 DIAGNOSIS — R269 Unspecified abnormalities of gait and mobility: Secondary | ICD-10-CM | POA: Diagnosis present

## 2021-05-25 DIAGNOSIS — R293 Abnormal posture: Secondary | ICD-10-CM | POA: Insufficient documentation

## 2021-05-25 NOTE — Therapy (Signed)
?OUTPATIENT PHYSICAL THERAPY TREATMENT NOTE ? ? ?Patient Name: Selena [name] ?MRN: [id number] ?DOB:Oct 17, 1935, 86 y.o., female ?Today's Date: 05/25/2021 ? ?PCP: McDiarmid, Blane Ohara, MD ?REFERRING PROVIDER: McDiarmid, Blane Ohara, MD ? ?END OF SESSION:  ? PT End of Session - 05/25/21 1218   ? ? Visit Number [id number]   ? Date for PT Re-Evaluation 07/16/21   ? Authorization Type [employer] medicare   ? Progress Note Due on Visit 10   ? PT Start Time 1135   ? PT Stop Time 4235   ? PT Time Calculation (min) 41 min   ? Activity Tolerance Patient tolerated treatment well   ? Behavior During Therapy [facility] for tasks assessed/performed   ? ?  ?  ? ?  ? ? ? ? ?Past Medical History:  ?Diagnosis Date  ? Acute gastric ulcer with hemorrhage   ? Acute on chronic diastolic congestive heart failure ([place]) 10/16/2018  ? AKI (acute kidney injury) ([place])   ? Anemia   ? PMH  ? Aneurysm (arteriovenous) of coronary vessels   ? Ankle edema, bilateral 01/08/2021  ? Atrial fibrillation ([place]) 01/01/2007  ? BACK PAIN, UPPER 07/09/2008  ? Breast cancer of upper-outer quadrant of right female breast ([place]) 03/21/2011  ? Breast cancer, stage 1 ([place])   ? Bruises easily   ? Cataract   ? history  ? Complication of anesthesia   ? COVID-19 virus infection 03/08/2020  ? Diverticulitis   ? DIVERTICULOSIS, COLON 12/26/2006  ? Essential hypertension 10/27/2017  ? Follicular 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 ([place]) 07/31/2014  ? Follicular non-Hodgkin's lymphoma ([place])   ? Frozen shoulder, Left 01/08/2021  ? Full dentures 01/08/2021  ? Goiter   ? multi-nodular  ? Gustatory rhinitis 01/07/2021  ? History of atrioventricular nodal ablation 10/16/2018  ? Hx of colonoscopy 2009  ? Influenza 12/18/2020  ? Insomnia 09/10/2014  ? LIVER FUNCTION TESTS, ABNORMAL, HX OF 12/26/2007  ? Macular degeneration, bilateral 01/08/2021  ? Memory loss 05/16/2007  ? Nocturnal hypoxemia 05/14/2020  ? OSTEOARTHRITIS 12/26/2006  ? takes Diclofenac daily but has stopped for surgery  ?  Osteoarthritis 12/26/2006  ? Qualifier: Diagnosis of  By: Paulina Fusi RN, Daine Gravel   ? Paraspinal mass 07/16/2014  ? Pneumonia 11/11/2017  ? Pneumonia 01/31/2018  ? Pneumonia due to COVID-19 virus 03/08/2020  ? PONV (postoperative nausea and vomiting)   ? S/P ablation of atrial flutter 07/17/2018  ? Sensorineural hearing loss (SNHL) of both ears 01/07/2021  ? Sepsis ([place]) 04/02/2020  ? Shortness of breath 05/28/2019  ? Skin cancer   ? Syncope 08/21/2018  ? Thoracic aortic aneurysm ([place]) 10/16/2018  ? Tibialis posterior tendinopathy 04/08/2019  ? Upper airway cough syndrome 10/27/2017  ? pfts 10/10/17 s sign airflow obst though the insp loop is somewhat flattened  - Allergy profile 10/26/2017 >  Eos 0.3 /  IgE  6 RAST neg - rx for gerd 10/26/17 > improved 12/07/2017   ? VERTIGO 09/07/2009  ? Has had Vestibular Rehab consultation  ? Visual impairment 01/07/2021  ? Wears dentures   ? Wears glasses   ? Wears hearing aid   ? bilateral  ? ?Past Surgical History:  ?Procedure Laterality Date  ? ABDOMINAL HYSTERECTOMY    ? ABLATION OF DYSRHYTHMIC FOCUS  2009  ? APPENDECTOMY    ? AUGMENTATION MAMMAPLASTY Right 2013  ? Subpectoral implant  ? BIOPSY  05/07/2019  ? Procedure: BIOPSY;  Surgeon: Irving Copas., MD;  Location: [facility];  Service: Gastroenterology;;  ?  BREAST BIOPSY  2013  ? BREAST RECONSTRUCTION  04/14/2011  ? Procedure: BREAST RECONSTRUCTION;  Surgeon: Crissie Reese, MD;  Location: [place];  Service: Plastics;  Laterality: Right;  Placement of Right Breast Tissue Expander with  use of Flex HD for Breast Reconstruction  ? BREAST SURGERY    ? right sm, snbx  ? CATARACT EXTRACTION W/ INTRAOCULAR LENS  IMPLANT, BILATERAL  2010  ? bilateral  ? COLONOSCOPY    ? COLONOSCOPY    ? ESOPHAGOGASTRODUODENOSCOPY (EGD) WITH PROPOFOL N/A 05/07/2019  ? Procedure: ESOPHAGOGASTRODUODENOSCOPY (EGD) WITH PROPOFOL;  Surgeon: Rush Landmark Telford Nab., MD;  Location: [facility];  Service: Gastroenterology;  Laterality: N/A;  ? KNEE  SURGERY  2004/2010  ? arthroscopic/ bilateral knee replacements  ? LOOP RECORDER IMPLANT    ? MASTECTOMY Right 2013  ? PORT-A-CATH REMOVAL N/A 12/07/2016  ? Procedure: REMOVAL PORT-A-CATH;  Surgeon: Rolm Bookbinder, MD;  Location: [place];  Service: General;  Laterality: N/A;  ? PORTACATH PLACEMENT Right 08/11/2014  ? Procedure: INSERTION PORT-A-CATH WITH ULTRASOUND;  Surgeon: Rolm Bookbinder, MD;  Location: [place];  Service: General;  Laterality: Right;  ? [place]  ? right  ? tmi  1998  ? TONSILLECTOMY    ? TOTAL KNEE ARTHROPLASTY Bilateral   ? ?Patient Active Problem List  ? Diagnosis Date Noted  ? Incontinence of feces 04/08/2021  ? Hemorrhoids 04/08/2021  ? Restrictive lung disease 04/05/2021  ? Macular degeneration, bilateral 01/08/2021  ? Vasomotor rhinitis 01/08/2021  ? Advanced directives, counseling/discussion 01/08/2021  ? Full dentures 01/08/2021  ? Ankle edema, bilateral 01/08/2021  ? OSA on CPAP 01/07/2021  ? Visual impairment 01/07/2021  ? Sensorineural hearing loss (SNHL) of both ears 01/07/2021  ? Multinodular thyroid recommended annual [place] surveillance 01/07/2021  ? Interstitial lung disease ([place])   ? Falls frequently 04/02/2020  ? Chronic coughing 05/28/2019  ? History of atrioventricular nodal ablation 10/16/2018  ? S/P ablation of atrial flutter 07/17/2018  ? Frozen shoulder, Left 11/11/2017  ? Rhinorrhea 10/27/2017  ? Essential hypertension 10/27/2017  ? Insomnia 09/10/2014  ? Follicular 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 ([place]) 07/31/2014  ? Breast cancer of upper-outer quadrant of right female breast ([place]) 03/21/2011  ? Memory loss 05/16/2007  ? Atrial fibrillation, permanent ([place]) 01/01/2007  ? Osteoarthritis 12/26/2006  ? ? ?REFERRING DIAG: R15.9 (ICD-10-CM) - Incontinence of feces, unspecified fecal incontinence type ? ?THERAPY DIAG:  ?Muscle weakness (generalized) ? ?Unspecified lack of coordination ? ?Abnormality of gait and mobility ? ?PERTINENT HISTORY:  OSA on CPAP, CHF, breast cancer, hypertension, non-Hodgkin's lymphoma, chronic A-fib on Eliquis, Thoracic aortic aneurysm, Skin cancer, DIVERTICULOSIS ?Sexual abuse: No ? ?PRECAUTIONS: None ? ?SUBJECTIVE: Pt reports she hasn't had any leakage at all since eval. Pt reports she  usually has 1-2 BMs per day, with appropriate urge and time to get there without leakage. No straining for BMs, usually types 5-6 but occasionally has type 4. Pt reports she does not take anything for fiber to bulk stools at this time.  ? ?PAIN:  ?Are you having pain? No ? ? ? ?OBJECTIVE:  ?  ?DIAGNOSTIC FINDINGS:  ?  ?COGNITION: ?           Overall cognitive status: Within functional limits for tasks assessed              ?            ?SENSATION: ?           Light touch:  Appears intact ?           Proprioception: Appears intact ?  ?MUSCLE LENGTH: ?Bil hamstrings limited by 75%  and adductors limited by 50% ?  ?  ?FUNCTIONAL TESTS:  ?Squat: unable to achieve without UE support, 25% descent, and holding breath to return to standing ?Sit to stand min A without arm from low height ?GAIT: ?Distance walked: 150' ?Assistive device utilized: Quad cane small base ?Level of assistance: Modified independence ?Comments: poor cadence, decreased hip flexion and bil foot clearance, decreased arm swing and trunk rotation noted ?  ?POSTURE:  ?Rounded shoulders, mild kyphosis ?  ?LUMBARAROM/PROM ?  ?Lumbar flexion limited by 50%, extension WFL, side bending limited by 75%, rotation limited by 50% ?  ?LE ROM: ?  ?Bil hip IR and ER limited by 50% ?  ?LE MMT: ?  ?Bil hip abduction 2+/5; all others 3/5 ?  ?PELVIC MMT: ?  ?MMT   ?04/15/2021 05/11/2021   ?Vaginal     ?Internal Anal Sphincter 1/5   ?External Anal Sphincter 2/5 3/5; 10s; 4 reps  ?Puborectalis 1/5   ?Diastasis Recti     ?(Blank rows = not tested) ?  ?      PALPATION: ?  General  TTP at Rt abdominal quadrant ?  ?              External Perineal Exam external hemorrhoids  ? ? ?              ?               Internal Pelvic Floor rectally assessed initially with demonstration of downward push with attempts to contract and mild tightening with attempts to bulge. Extra time spent to cue proper technique for breathing and

## 2021-06-01 ENCOUNTER — Ambulatory Visit: Payer: Medicare Other | Admitting: Physical Therapy

## 2021-06-01 DIAGNOSIS — M6281 Muscle weakness (generalized): Secondary | ICD-10-CM

## 2021-06-01 DIAGNOSIS — R279 Unspecified lack of coordination: Secondary | ICD-10-CM

## 2021-06-01 DIAGNOSIS — R293 Abnormal posture: Secondary | ICD-10-CM

## 2021-06-01 NOTE — Therapy (Signed)
?OUTPATIENT PHYSICAL THERAPY TREATMENT NOTE ? ? ?Patient Name: Selena [name] ?MRN: [id number] ?DOB:08/22/1935, 86 y.o., female ?Today's Date: 06/01/2021 ? ?PCP: McDiarmid, Blane Ohara, MD ?REFERRING PROVIDER: McDiarmid, Blane Ohara, MD ? ?END OF SESSION:  ? PT End of Session - 06/01/21 1220   ? ? Visit Number 6   ? Date for PT Re-Evaluation 07/16/21   ? Authorization Type UHC medicare   ? Progress Note Due on Visit 10   ? PT Start Time 1145   ? PT Stop Time 7062   ? PT Time Calculation (min) 39 min   ? Activity Tolerance Patient tolerated treatment well   ? Behavior During Therapy [facility] for tasks assessed/performed   ? ?  ?  ? ?  ? ? ? ? ? ?Past Medical History:  ?Diagnosis Date  ? Acute gastric ulcer with hemorrhage   ? Acute on chronic diastolic congestive heart failure ([place]) 10/16/2018  ? AKI (acute kidney injury) ([place])   ? Anemia   ? PMH  ? Aneurysm (arteriovenous) of coronary vessels   ? Ankle edema, bilateral 01/08/2021  ? Atrial fibrillation ([place]) 01/01/2007  ? BACK PAIN, UPPER 07/09/2008  ? Breast cancer of upper-outer quadrant of right female breast ([place]) 03/21/2011  ? Breast cancer, stage 1 ([place])   ? Bruises easily   ? Cataract   ? history  ? Complication of anesthesia   ? COVID-19 virus infection 03/08/2020  ? Diverticulitis   ? DIVERTICULOSIS, COLON 12/26/2006  ? Essential hypertension 10/27/2017  ? Follicular 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 ([place]) 07/31/2014  ? Follicular non-Hodgkin's lymphoma ([place])   ? Frozen shoulder, Left 01/08/2021  ? Full dentures 01/08/2021  ? Goiter   ? multi-nodular  ? Gustatory rhinitis 01/07/2021  ? History of atrioventricular nodal ablation 10/16/2018  ? Hx of colonoscopy 2009  ? Influenza 12/18/2020  ? Insomnia 09/10/2014  ? LIVER FUNCTION TESTS, ABNORMAL, HX OF 12/26/2007  ? Macular degeneration, bilateral 01/08/2021  ? Memory loss 05/16/2007  ? Nocturnal hypoxemia 05/14/2020  ? OSTEOARTHRITIS 12/26/2006  ? takes Diclofenac daily but has stopped for surgery  ?  Osteoarthritis 12/26/2006  ? Qualifier: Diagnosis of  By: Paulina Fusi RN, Daine Gravel   ? Paraspinal mass 07/16/2014  ? Pneumonia 11/11/2017  ? Pneumonia 01/31/2018  ? Pneumonia due to COVID-19 virus 03/08/2020  ? PONV (postoperative nausea and vomiting)   ? S/P ablation of atrial flutter 07/17/2018  ? Sensorineural hearing loss (SNHL) of both ears 01/07/2021  ? Sepsis ([place]) 04/02/2020  ? Shortness of breath 05/28/2019  ? Skin cancer   ? Syncope 08/21/2018  ? Thoracic aortic aneurysm ([place]) 10/16/2018  ? Tibialis posterior tendinopathy 04/08/2019  ? Upper airway cough syndrome 10/27/2017  ? pfts 10/10/17 s sign airflow obst though the insp loop is somewhat flattened  - Allergy profile 10/26/2017 >  Eos 0.3 /  IgE  6 RAST neg - rx for gerd 10/26/17 > improved 12/07/2017   ? VERTIGO 09/07/2009  ? Has had Vestibular Rehab consultation  ? Visual impairment 01/07/2021  ? Wears dentures   ? Wears glasses   ? Wears hearing aid   ? bilateral  ? ?Past Surgical History:  ?Procedure Laterality Date  ? ABDOMINAL HYSTERECTOMY    ? ABLATION OF DYSRHYTHMIC FOCUS  2009  ? APPENDECTOMY    ? AUGMENTATION MAMMAPLASTY Right 2013  ? Subpectoral implant  ? BIOPSY  05/07/2019  ? Procedure: BIOPSY;  Surgeon: Rush Landmark Telford Nab., MD;  Location: [facility];  Service:  Gastroenterology;;  ? BREAST BIOPSY  2013  ? BREAST RECONSTRUCTION  04/14/2011  ? Procedure: BREAST RECONSTRUCTION;  Surgeon: Crissie Reese, MD;  Location: [place];  Service: Plastics;  Laterality: Right;  Placement of Right Breast Tissue Expander with  use of Flex HD for Breast Reconstruction  ? BREAST SURGERY    ? right sm, snbx  ? CATARACT EXTRACTION W/ INTRAOCULAR LENS  IMPLANT, BILATERAL  2010  ? bilateral  ? COLONOSCOPY    ? COLONOSCOPY    ? ESOPHAGOGASTRODUODENOSCOPY (EGD) WITH PROPOFOL N/A 05/07/2019  ? Procedure: ESOPHAGOGASTRODUODENOSCOPY (EGD) WITH PROPOFOL;  Surgeon: Rush Landmark Telford Nab., MD;  Location: [facility];  Service: Gastroenterology;  Laterality: N/A;  ? KNEE  SURGERY  2004/2010  ? arthroscopic/ bilateral knee replacements  ? LOOP RECORDER IMPLANT    ? MASTECTOMY Right 2013  ? PORT-A-CATH REMOVAL N/A 12/07/2016  ? Procedure: REMOVAL PORT-A-CATH;  Surgeon: Rolm Bookbinder, MD;  Location: [place];  Service: General;  Laterality: N/A;  ? PORTACATH PLACEMENT Right 08/11/2014  ? Procedure: INSERTION PORT-A-CATH WITH ULTRASOUND;  Surgeon: Rolm Bookbinder, MD;  Location: [place];  Service: General;  Laterality: Right;  ? [place]  ? right  ? tmi  1998  ? TONSILLECTOMY    ? TOTAL KNEE ARTHROPLASTY Bilateral   ? ?Patient Active Problem List  ? Diagnosis Date Noted  ? Incontinence of feces 04/08/2021  ? Hemorrhoids 04/08/2021  ? Restrictive lung disease 04/05/2021  ? Macular degeneration, bilateral 01/08/2021  ? Vasomotor rhinitis 01/08/2021  ? Advanced directives, counseling/discussion 01/08/2021  ? Full dentures 01/08/2021  ? Ankle edema, bilateral 01/08/2021  ? OSA on CPAP 01/07/2021  ? Visual impairment 01/07/2021  ? Sensorineural hearing loss (SNHL) of both ears 01/07/2021  ? Multinodular thyroid recommended annual [place] surveillance 01/07/2021  ? Interstitial lung disease ([place])   ? Falls frequently 04/02/2020  ? Chronic coughing 05/28/2019  ? History of atrioventricular nodal ablation 10/16/2018  ? S/P ablation of atrial flutter 07/17/2018  ? Frozen shoulder, Left 11/11/2017  ? Rhinorrhea 10/27/2017  ? Essential hypertension 10/27/2017  ? Insomnia 09/10/2014  ? Follicular 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 ([place]) 07/31/2014  ? Breast cancer of upper-outer quadrant of right female breast ([place]) 03/21/2011  ? Memory loss 05/16/2007  ? Atrial fibrillation, permanent ([place]) 01/01/2007  ? Osteoarthritis 12/26/2006  ? ? ?REFERRING DIAG: R15.9 (ICD-10-CM) - Incontinence of feces, unspecified fecal incontinence type ? ?THERAPY DIAG:  ?Muscle weakness (generalized) ? ?Abnormal posture ? ?Unspecified lack of coordination ? ?PERTINENT HISTORY: OSA on CPAP, CHF,  breast cancer, hypertension, non-Hodgkin's lymphoma, chronic A-fib on Eliquis, Thoracic aortic aneurysm, Skin cancer, DIVERTICULOSIS ?Sexual abuse: No ? ?PRECAUTIONS: None ? ?SUBJECTIVE: Pt reports she had one instance of avery small amount of fecal leakage with heavy coughing. But other than this is feeling much better. Pt reports pelvic tilts and gentle mobility while at toilet has helped a lot with fully empty. Pt reports she is no longer wearing depends and more confident going into community now as she is no longer leaking regularly.  ? ?PAIN:  ?Are you having pain? No ? ? ? ?OBJECTIVE:  ?  ?DIAGNOSTIC FINDINGS:  ?  ?COGNITION: ?           Overall cognitive status: Within functional limits for tasks assessed              ?            ?SENSATION: ?  Light touch: Appears intact ?           Proprioception: Appears intact ?  ?MUSCLE LENGTH: ?Bil hamstrings limited by 75%  and adductors limited by 50% ?  ?  ?FUNCTIONAL TESTS:  ?Squat: unable to achieve without UE support, 25% descent, and holding breath to return to standing ?Sit to stand min A without arm from low height ?GAIT: ?Distance walked: 150' ?Assistive device utilized: Quad cane small base ?Level of assistance: Modified independence ?Comments: poor cadence, decreased hip flexion and bil foot clearance, decreased arm swing and trunk rotation noted ?  ?POSTURE:  ?Rounded shoulders, mild kyphosis ?  ?LUMBARAROM/PROM ?  ?Lumbar flexion limited by 50%, extension WFL, side bending limited by 75%, rotation limited by 50% ?  ?LE ROM: ?  ?Bil hip IR and ER limited by 50% ?  ?LE MMT: ?  ?Bil hip abduction 2+/5; all others 3/5 ?  ?PELVIC MMT: ?  ?MMT   ?04/15/2021 05/11/2021   ?Vaginal     ?Internal Anal Sphincter 1/5   ?External Anal Sphincter 2/5 3/5; 10s; 4 reps  ?Puborectalis 1/5   ?Diastasis Recti     ?(Blank rows = not tested) ?  ?      PALPATION: ?  General  TTP at Rt abdominal quadrant ?  ?              External Perineal Exam external hemorrhoids   ? ? ?              ?              Internal Pelvic Floor rectally assessed initially with demonstration of downward push with attempts to contract and mild tightening with attempts to bulge. Extra time spent to cue

## 2021-06-08 ENCOUNTER — Ambulatory Visit: Payer: Medicare Other | Admitting: Physical Therapy

## 2021-06-08 DIAGNOSIS — N819 Female genital prolapse, unspecified: Secondary | ICD-10-CM | POA: Insufficient documentation

## 2021-06-08 DIAGNOSIS — M6281 Muscle weakness (generalized): Secondary | ICD-10-CM

## 2021-06-08 DIAGNOSIS — R293 Abnormal posture: Secondary | ICD-10-CM

## 2021-06-08 DIAGNOSIS — R279 Unspecified lack of coordination: Secondary | ICD-10-CM

## 2021-06-08 NOTE — Therapy (Signed)
?OUTPATIENT PHYSICAL THERAPY TREATMENT NOTE ? ? ?Patient Name: Selena [name] ?MRN: [id number] ?DOB:December 21, 1935, 86 y.o., female ?Today's Date: 06/08/2021 ? ?PCP: McDiarmid, Blane Ohara, MD ?REFERRING PROVIDER: McDiarmid, Blane Ohara, MD ? ?END OF SESSION:  ? PT End of Session - 06/08/21 1153   ? ? Visit Number [id number]   ? Date for PT Re-Evaluation 07/16/21   ? Authorization Type [employer] medicare   ? Progress Note Due on Visit 10   ? PT Start Time 1116   ? PT Stop Time 6734   ? PT Time Calculation (min) 41 min   ? Activity Tolerance Patient tolerated treatment well   ? Behavior During Therapy [facility] for tasks assessed/performed   ? ?  ?  ? ?  ? ? ? ? ? ? ?Past Medical History:  ?Diagnosis Date  ? Acute gastric ulcer with hemorrhage   ? Acute on chronic diastolic congestive heart failure ([place]) 10/16/2018  ? AKI (acute kidney injury) ([place])   ? Anemia   ? PMH  ? Aneurysm (arteriovenous) of coronary vessels   ? Ankle edema, bilateral 01/08/2021  ? Atrial fibrillation ([place]) 01/01/2007  ? BACK PAIN, UPPER 07/09/2008  ? Breast cancer of upper-outer quadrant of right female breast ([place]) 03/21/2011  ? Breast cancer, stage 1 ([place])   ? Bruises easily   ? Cataract   ? history  ? Complication of anesthesia   ? COVID-19 virus infection 03/08/2020  ? Diverticulitis   ? DIVERTICULOSIS, COLON 12/26/2006  ? Essential hypertension 10/27/2017  ? Follicular 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 ([place]) 07/31/2014  ? Follicular non-Hodgkin's lymphoma ([place])   ? Frozen shoulder, Left 01/08/2021  ? Full dentures 01/08/2021  ? Goiter   ? multi-nodular  ? Gustatory rhinitis 01/07/2021  ? History of atrioventricular nodal ablation 10/16/2018  ? Hx of colonoscopy 2009  ? Influenza 12/18/2020  ? Insomnia 09/10/2014  ? LIVER FUNCTION TESTS, ABNORMAL, HX OF 12/26/2007  ? Macular degeneration, bilateral 01/08/2021  ? Memory loss 05/16/2007  ? Nocturnal hypoxemia 05/14/2020  ? OSTEOARTHRITIS 12/26/2006  ? takes Diclofenac daily but has stopped for surgery  ?  Osteoarthritis 12/26/2006  ? Qualifier: Diagnosis of  By: Paulina Fusi RN, Daine Gravel   ? Paraspinal mass 07/16/2014  ? Pneumonia 11/11/2017  ? Pneumonia 01/31/2018  ? Pneumonia due to COVID-19 virus 03/08/2020  ? PONV (postoperative nausea and vomiting)   ? S/P ablation of atrial flutter 07/17/2018  ? Sensorineural hearing loss (SNHL) of both ears 01/07/2021  ? Sepsis ([place]) 04/02/2020  ? Shortness of breath 05/28/2019  ? Skin cancer   ? Syncope 08/21/2018  ? Thoracic aortic aneurysm ([place]) 10/16/2018  ? Tibialis posterior tendinopathy 04/08/2019  ? Upper airway cough syndrome 10/27/2017  ? pfts 10/10/17 s sign airflow obst though the insp loop is somewhat flattened  - Allergy profile 10/26/2017 >  Eos 0.3 /  IgE  6 RAST neg - rx for gerd 10/26/17 > improved 12/07/2017   ? VERTIGO 09/07/2009  ? Has had Vestibular Rehab consultation  ? Visual impairment 01/07/2021  ? Wears dentures   ? Wears glasses   ? Wears hearing aid   ? bilateral  ? ?Past Surgical History:  ?Procedure Laterality Date  ? ABDOMINAL HYSTERECTOMY    ? ABLATION OF DYSRHYTHMIC FOCUS  2009  ? APPENDECTOMY    ? AUGMENTATION MAMMAPLASTY Right 2013  ? Subpectoral implant  ? BIOPSY  05/07/2019  ? Procedure: BIOPSY;  Surgeon: Irving Copas., MD;  Location: [facility];  Service: Gastroenterology;;  ? BREAST BIOPSY  2013  ? BREAST RECONSTRUCTION  04/14/2011  ? Procedure: BREAST RECONSTRUCTION;  Surgeon: Crissie Reese, MD;  Location: [place];  Service: Plastics;  Laterality: Right;  Placement of Right Breast Tissue Expander with  use of Flex HD for Breast Reconstruction  ? BREAST SURGERY    ? right sm, snbx  ? CATARACT EXTRACTION W/ INTRAOCULAR LENS  IMPLANT, BILATERAL  2010  ? bilateral  ? COLONOSCOPY    ? COLONOSCOPY    ? ESOPHAGOGASTRODUODENOSCOPY (EGD) WITH PROPOFOL N/A 05/07/2019  ? Procedure: ESOPHAGOGASTRODUODENOSCOPY (EGD) WITH PROPOFOL;  Surgeon: Rush Landmark Telford Nab., MD;  Location: [facility];  Service: Gastroenterology;  Laterality: N/A;  ? KNEE  SURGERY  2004/2010  ? arthroscopic/ bilateral knee replacements  ? LOOP RECORDER IMPLANT    ? MASTECTOMY Right 2013  ? PORT-A-CATH REMOVAL N/A 12/07/2016  ? Procedure: REMOVAL PORT-A-CATH;  Surgeon: Rolm Bookbinder, MD;  Location: [facility] [facility];  Service: General;  Laterality: N/A;  ? PORTACATH PLACEMENT Right 08/11/2014  ? Procedure: INSERTION PORT-A-CATH WITH ULTRASOUND;  Surgeon: Rolm Bookbinder, MD;  Location: [place];  Service: General;  Laterality: Right;  ? [place]  ? right  ? tmi  1998  ? TONSILLECTOMY    ? TOTAL KNEE ARTHROPLASTY Bilateral   ? ?Patient Active Problem List  ? Diagnosis Date Noted  ? Incontinence of feces 04/08/2021  ? Hemorrhoids 04/08/2021  ? Restrictive lung disease 04/05/2021  ? Macular degeneration, bilateral 01/08/2021  ? Vasomotor rhinitis 01/08/2021  ? Advanced directives, counseling/discussion 01/08/2021  ? Full dentures 01/08/2021  ? Ankle edema, bilateral 01/08/2021  ? OSA on CPAP 01/07/2021  ? Visual impairment 01/07/2021  ? Sensorineural hearing loss (SNHL) of both ears 01/07/2021  ? Multinodular thyroid recommended annual [place] surveillance 01/07/2021  ? Interstitial lung disease ([place])   ? Falls frequently 04/02/2020  ? Chronic coughing 05/28/2019  ? History of atrioventricular nodal ablation 10/16/2018  ? S/P ablation of atrial flutter 07/17/2018  ? Frozen shoulder, Left 11/11/2017  ? Rhinorrhea 10/27/2017  ? Essential hypertension 10/27/2017  ? Insomnia 09/10/2014  ? Follicular 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 ([place]) 07/31/2014  ? Breast cancer of upper-outer quadrant of right female breast ([place]) 03/21/2011  ? Memory loss 05/16/2007  ? Atrial fibrillation, permanent ([place]) 01/01/2007  ? Osteoarthritis 12/26/2006  ? ? ?REFERRING DIAG: R15.9 (ICD-10-CM) - Incontinence of feces, unspecified fecal incontinence type ? ?THERAPY DIAG:  ?Muscle weakness (generalized) ? ?Unspecified lack of coordination ? ?Abnormal posture ? ?PERTINENT HISTORY: OSA on CPAP, CHF,  breast cancer, hypertension, non-Hodgkin's lymphoma, chronic A-fib on Eliquis, Thoracic aortic aneurysm, Skin cancer, DIVERTICULOSIS ?Sexual abuse: No ? ?PRECAUTIONS: None ? ?SUBJECTIVE: Pt reports she had very small amount of leakage the past few days a couple of times each, and a small amount with coughing. Pt reports consistency is very thin and liquidly and always after a BM. Pt reports she thinks moving around at the toilet does help fully empty but she thinks there has been a little left.  ?PAIN:  ?Are you having pain? No ? ? ? ?OBJECTIVE:  ?  ?DIAGNOSTIC FINDINGS:  ?  ?COGNITION: ?           Overall cognitive status: Within functional limits for tasks assessed              ?            ?SENSATION: ?           Light  touch: Appears intact ?           Proprioception: Appears intact ?  ?MUSCLE LENGTH: ?Bil hamstrings limited by 75%  and adductors limited by 50% ?  ?  ?FUNCTIONAL TESTS:  ?Squat: unable to achieve without UE support, 25% descent, and holding breath to return to standing ?Sit to stand min A without arm from low height ?GAIT: ?Distance walked: 150' ?Assistive device utilized: Quad cane small base ?Level of assistance: Modified independence ?Comments: poor cadence, decreased hip flexion and bil foot clearance, decreased arm swing and trunk rotation noted ?  ?POSTURE:  ?Rounded shoulders, mild kyphosis ?  ?LUMBARAROM/PROM ?  ?Lumbar flexion limited by 50%, extension WFL, side bending limited by 75%, rotation limited by 50% ?  ?LE ROM: ?  ?Bil hip IR and ER limited by 50% ?  ?LE MMT: ?  ?Bil hip abduction 2+/5; all others 3/5 ?  ?PELVIC MMT: ?  ?MMT   ?04/15/2021 05/11/2021  06/08/2021   ?Vaginal    2/5, 6s, 8 reps. With quick release NMRE tech, pt improved to 3/5 for 2 reps  ?Internal Anal Sphincter 1/5    ?External Anal Sphincter 2/5 3/5; 10s; 4 reps   ?Puborectalis 1/5    ?Diastasis Recti      ?(Blank rows = not tested) ?  ?      PALPATION: ?  General  TTP at Rt abdominal quadrant ?  ?               External Perineal Exam external hemorrhoids  ? ? ?              ?              Internal Pelvic Floor rectally assessed initially with demonstration of downward push with attempts to contract and mild tightening

## 2021-06-14 ENCOUNTER — Ambulatory Visit: Payer: Medicare Other | Admitting: Pulmonary Disease

## 2021-06-15 ENCOUNTER — Ambulatory Visit: Payer: Medicare Other | Admitting: Physical Therapy

## 2021-06-15 DIAGNOSIS — M6281 Muscle weakness (generalized): Secondary | ICD-10-CM | POA: Diagnosis not present

## 2021-06-15 DIAGNOSIS — R293 Abnormal posture: Secondary | ICD-10-CM

## 2021-06-15 DIAGNOSIS — R279 Unspecified lack of coordination: Secondary | ICD-10-CM

## 2021-06-15 NOTE — Therapy (Addendum)
OUTPATIENT PHYSICAL THERAPY TREATMENT NOTE   Patient Name: Selena [name] MRN: [id number] DOB:1935/12/22, 86 y.o., female Today's Date: 06/15/2021  PCP: McDiarmid, Blane Ohara, MD REFERRING PROVIDER: McDiarmid, Blane Ohara, MD  END OF SESSION:   PT End of Session - 06/15/21 1224     Visit Number 8    Date for PT Re-Evaluation 07/16/21    Authorization Type [employer] medicare    Progress Note Due on Visit 10    PT Start Time 1143    PT Stop Time 1221    PT Time Calculation (min) 38 min    Activity Tolerance Patient tolerated treatment well    Behavior During Therapy [facility] for tasks assessed/performed                  Past Medical History:  Diagnosis Date   Acute gastric ulcer with hemorrhage    Acute on chronic diastolic congestive heart failure ([place]) 10/16/2018   AKI (acute kidney injury) ([place])    Anemia    PMH   Aneurysm (arteriovenous) of coronary vessels    Ankle edema, bilateral 01/08/2021   Atrial fibrillation ([place]) 01/01/2007   BACK PAIN, UPPER 07/09/2008   Breast cancer of upper-outer quadrant of right female breast ([place]) 03/21/2011   Breast cancer, stage 1 ([place])    Bruises easily    Cataract    history   Complication of anesthesia    COVID-19 virus infection 03/08/2020   Diverticulitis    DIVERTICULOSIS, COLON 12/26/2006   Essential hypertension 16/96/7893   Follicular 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 ([place]) 81/01/7508   Follicular non-Hodgkin's lymphoma ([name])    Frozen shoulder, Left 01/08/2021   Full dentures 01/08/2021   Goiter    multi-nodular   Gustatory rhinitis 01/07/2021   History of atrioventricular nodal ablation 10/16/2018   Hx of colonoscopy 2009   Influenza 12/18/2020   Insomnia 09/10/2014   LIVER FUNCTION TESTS, ABNORMAL, HX OF 12/26/2007   Macular degeneration, bilateral 01/08/2021   Memory loss 05/16/2007   Nocturnal hypoxemia 05/14/2020   OSTEOARTHRITIS 12/26/2006   takes Diclofenac daily but has stopped for surgery    Osteoarthritis 12/26/2006   Qualifier: Diagnosis of  By: Paulina Fusi, RN, Judy Ann    Paraspinal mass 07/16/2014   Pneumonia 11/11/2017   Pneumonia 01/31/2018   Pneumonia due to COVID-19 virus 03/08/2020   PONV (postoperative nausea and vomiting)    S/P ablation of atrial flutter 07/17/2018   Sensorineural hearing loss (SNHL) of both ears 01/07/2021   Sepsis ([place]) 04/02/2020   Shortness of breath 05/28/2019   Skin cancer    Syncope 08/21/2018   Thoracic aortic aneurysm ([place]) 10/16/2018   Tibialis posterior tendinopathy 04/08/2019   Upper airway cough syndrome 10/27/2017   pfts 10/10/17 s sign airflow obst though the insp loop is somewhat flattened  - Allergy profile 10/26/2017 >  Eos 0.3 /  IgE  6 RAST neg - rx for gerd 10/26/17 > improved 12/07/2017    VERTIGO 09/07/2009   Has had Vestibular Rehab consultation   Visual impairment 01/07/2021   Wears dentures    Wears glasses    Wears hearing aid    bilateral   Past Surgical History:  Procedure Laterality Date   ABDOMINAL HYSTERECTOMY     ABLATION OF DYSRHYTHMIC FOCUS  2009   APPENDECTOMY     AUGMENTATION MAMMAPLASTY Right 2013   Subpectoral implant   BIOPSY  05/07/2019   Procedure: BIOPSY;  Surgeon: Irving Copas., MD;  Location: [facility];  Service: Gastroenterology;;   BREAST BIOPSY  2013   BREAST RECONSTRUCTION  04/14/2011   Procedure: BREAST RECONSTRUCTION;  Surgeon: Crissie Reese, MD;  Location: [facility];  Service: Plastics;  Laterality: Right;  Placement of Right Breast Tissue Expander with  use of Flex HD for Breast Reconstruction   BREAST SURGERY     right sm, snbx   CATARACT EXTRACTION W/ INTRAOCULAR LENS  IMPLANT, BILATERAL  2010   bilateral   COLONOSCOPY     COLONOSCOPY     ESOPHAGOGASTRODUODENOSCOPY (EGD) WITH PROPOFOL N/A 05/07/2019   Procedure: ESOPHAGOGASTRODUODENOSCOPY (EGD) WITH PROPOFOL;  Surgeon: Irving Copas., MD;  Location: [facility];  Service: Gastroenterology;  Laterality: N/A;   KNEE  SURGERY  2004/2010   arthroscopic/ bilateral knee replacements   LOOP RECORDER IMPLANT     MASTECTOMY Right 2013   PORT-A-CATH REMOVAL N/A 12/07/2016   Procedure: REMOVAL PORT-A-CATH;  Surgeon: Rolm Bookbinder, MD;  Location: [facility];  Service: General;  Laterality: N/A;   PORTACATH PLACEMENT Right 08/11/2014   Procedure: INSERTION PORT-A-CATH WITH ULTRASOUND;  Surgeon: Rolm Bookbinder, MD;  Location: [facility];  Service: General;  Laterality: Right;   [place]   right   [place] Bilateral    Patient Active Problem List   Diagnosis Date Noted   Incontinence of feces 04/08/2021   Hemorrhoids 04/08/2021   Restrictive lung disease 04/05/2021   Macular degeneration, bilateral 01/08/2021   Vasomotor rhinitis 01/08/2021   Advanced directives, counseling/discussion 01/08/2021   Full dentures 01/08/2021   Ankle edema, bilateral 01/08/2021   OSA on CPAP 01/07/2021   Visual impairment 01/07/2021   Sensorineural hearing loss (SNHL) of both ears 01/07/2021   Multinodular thyroid recommended annual [place] surveillance 01/07/2021   Interstitial lung disease ([place])    Falls frequently 04/02/2020   Chronic coughing 05/28/2019   History of atrioventricular nodal ablation 10/16/2018   S/P ablation of atrial flutter 07/17/2018   Frozen shoulder, Left 11/11/2017   Rhinorrhea 10/27/2017   Essential hypertension 10/27/2017   Insomnia 62/56/3893   Follicular 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 ([place]) 07/31/2014   Breast cancer of upper-outer quadrant of right female breast ([place]) 03/21/2011   Memory loss 05/16/2007   Atrial fibrillation, permanent ([place]) 01/01/2007   Osteoarthritis 12/26/2006    REFERRING DIAG: R15.9 (ICD-10-CM) - Incontinence of feces, unspecified fecal incontinence type  THERAPY DIAG:  Muscle weakness (generalized)  Unspecified lack of coordination  Abnormal posture  PERTINENT HISTORY: OSA on CPAP, CHF,  breast cancer, hypertension, non-Hodgkin's lymphoma, chronic A-fib on Eliquis, Thoracic aortic aneurysm, Skin cancer, DIVERTICULOSIS Sexual abuse: No  PRECAUTIONS: None  SUBJECTIVE: Pt reports she has been feeling better and only had a a few instances of staining/streaking once per day Friday-Monday of this past week but nothing other than this and this only occurs after a bowel movement. Pt reports she did attempt splinting but didn't think this helped. Pt reports she is at least 90% better since starting PT.  PAIN:  Are you having pain? No    OBJECTIVE:    DIAGNOSTIC FINDINGS:    COGNITION:            Overall cognitive status: Within functional limits for tasks assessed                          SENSATION:            Light  touch: Appears intact            Proprioception: Appears intact   MUSCLE LENGTH: Bil hamstrings limited by 75%  and adductors limited by 50%     FUNCTIONAL TESTS:  Squat: unable to achieve without UE support, 25% descent, and holding breath to return to standing Sit to stand min A without arm from low height GAIT: Distance walked: 150' Assistive device utilized: Lobbyist Level of assistance: Modified independence Comments: poor cadence, decreased hip flexion and bil foot clearance, decreased arm swing and trunk rotation noted   POSTURE:  Rounded shoulders, mild kyphosis   LUMBARAROM/PROM   Lumbar flexion limited by 50%, extension WFL, side bending limited by 75%, rotation limited by 50%   LE ROM:   Bil hip IR and ER limited by 50%   LE MMT:   Bil hip abduction 2+/5; all others 3/5   PELVIC MMT:   MMT   04/15/2021 05/11/2021  06/08/2021   Vaginal    2/5, 6s, 8 reps. With quick release NMRE tech, pt improved to 3/5 for 2 reps  Internal Anal Sphincter 1/5    External Anal Sphincter 2/5 3/5; 10s; 4 reps   Puborectalis 1/5    Diastasis Recti      (Blank rows = not tested)         PALPATION:   General  TTP at Rt abdominal quadrant                  External Perineal Exam external hemorrhoids                 Internal Pelvic Floor rectally assessed initially with demonstration of downward push with attempts to contract and mild tightening with attempts to bulge. Extra time spent to cue proper technique for breathing and pelvic floor mobility with improved strength noted to above readings, without cues 0/5 and poor coordination noted.    TONE: Decreased    PROLAPSE: Not noted rectally  06/08/2021 possible grade 2-3 anterior wall prolapse in hooklying with strong cough x3; did feel tissue decent with posterior wall assessment however due to pt's limited mobility difficulty to assess fully.    TODAY'S TREATMENT   06/15/2021: Pt educated on peribottle for help with cleaning post bowel movement and as per Dr. Wendy Poet pt can use Metamucil to help firm her bowel movements.   X10 Sit to stand from chair with use of arms Gait with Single point cane 300' mod I with cues for midline stance and increased bil step height to improve mechanics and efficiency to bathroom at home without leakage Nustep 6 mins L5 for improved bil LE strengthening and tolerance to activity without leakage.   Foam roll press down with exhale for TA activation 2x10  Opp hand/knee press in sitting x10 each  X10 pelvic floor contractions, X52 quick flicks, W41 32G isometrics All exercises cued to coordinate breathing and pelvic floor for decreased leakage and improved strengthening of core and pelvic floor  06/08/2021  Pt consented to internal vaginal assessment to assess pelvic floor here compared to rectally. Findings above in chart.  NMRE treatment during internal vaginal session with quick release technique used for improved muscle recruitment with fair effect. Pt does also need extra time and cues for improved pressure management and breathing mechanics as initially she does demonstrate continued abdominal bulge with attempts to contract pelvic floor and  inhaling to improve abdominal bracing in attempt to contract pelvic floor. Did improve with max  single step verbal and tactile cues though only slightly.   Pt also educated on splinting at perineal body during BMs to improve fully emptying to see if this helps decrease fecal leakage after BMs. Pt reported understanding and reported she would try this.  Pt also encouraged to ask doctor about if he thought a fiber supplement would be helpful to pt, pt asked about this as she has Metamucil at home but doesn't use it. PT educated pt that to ask questions about medications or supplements to doctor and pt agreed, asked PT to message MD so she does not forget as she has appt with him next week. PT completed this.   5/9/2023Diona Foley squeezes in hooklying 2x10 H 2x10 ball same hand/knee press Sit to stand from mat table 2x10  X10 pelvic floor contractions in standing, Z61 quick flicks in standing  0/09/6043:  Ball squeezes in hooklying 2x10 Sidelying ball press with hip abduction x10 each Sit to stand from mat table x10 X10 hooklying transverse abdominus activations with tactile cues and VC to complete without abdominal bulging, downward straining or valsalva  X10 mini marches in hooklying with TA activation X10 pelvic floor contractions in standing without outward noted compensations W09 quick flicks in standing pt reports this is much more difficult for her in standing All exercises cued to coordinate breathing and pelvic floor for decreased leakage and improved strengthening of core and pelvic floor  05/18/2021: Pelvic tilts (ant/post, Rt/Lt) on disc in sitting 3x10 Sit to stand from chair x10  with cues for pelvic floor mobility and breathing coordination  Abdominal massage in hooklying x5, x5 female method, x5 ILU method. Pt had mild TTP at lower Rt quadrant and along bil upper abdominal quadrants but reported this relieved after a few repetitions. Only light pressure applied for all to decrease tension  and pain for pt. Pt reported she felt much better after this.   05/11/2021: External visual assessment completed in sidelying of EAS to insure progress and pt completed pelvic floor HEP correctly. Pt demonstrated improved mechanics with ability to contraction EAS well and accurately based on visual assessment and external palpation. Pt tolerated well, no stool present or leakage. Pt did required VC for breathing mechanics and to not hold breath. Pt demonstrated improved strength based on previous assessment.  2x10 pelvic floor contractions, W11 quick flicks, x5 isometrics for 10s and VC to breathe normally and improved coordination.  Pt also educated and PT gave demo on pt of abdominal massage for improved bowel habits.     05/04/2021:  Seated diaphragmatic breathing 4x10 with pt needing extra time to complete and NMRE required for improved coordinating Seated diaphragmatic breathing with ball press x10 with pt needing extra time to complete and NMRE required for improved coordinating 2x10 ball squeezes in sitting with exhale with pt needing extra time to complete and NMRE required for improved coordinating  2x5 pelvic floor contractions coordinated with exhale as pt reported she was able to feel this better than without exhale and improved further with sitting on towel roll  X5 5s isometric pelvic floor contractions/holds in sitting Sit to stand x10 with pelvic floor contractions and exhale 2x10 ball squeezes in sitting with exhale  EVAL 04/15/2021 Examination completed, findings reviewed, pt educated on POC, HEP, and voiding mechanics. Extra time spent for NMRE for improved coordination of breathing and pelvic floor with noted improved strength minimally with improved technique and ability to active isolated pelvic floor muscles. Pt motivated to participate in PT  and agreeable to attempt recommendations.        PATIENT EDUCATION:  Education details: [id number]  Person educated: Patient Education  method: Explanation, Demonstration, Tactile cues, Verbal cues, and Handouts Education comprehension: verbalized understanding and returned demonstration     HOME EXERCISE PROGRAM: [id number]   ASSESSMENT:   CLINICAL IMPRESSION: Patient reports she has only had very minimal streaking post bowel movements but other than this no other symptoms. Pt MD reported she could try Metamucil and pt reports she has not bought it yet but will, MD also stated if incontinence worsens then of course stop using metamucil and pt reported she understood. Pt session focused on hip and core strengthening with coordination of pelvic floor and breathing to decrease strain at pelvic floor and decrease leakage. Pt tolerated well and demonstrated improvement with standing activity and ability to feel all pelvic floor contractions. Pt would benefit from additional PT to further address deficits.        OBJECTIVE IMPAIRMENTS decreased balance, decreased coordination, decreased endurance, decreased mobility, difficulty walking, decreased strength, hypomobility, increased fascial restrictions, impaired flexibility, improper body mechanics, postural dysfunction, and pain.    ACTIVITY LIMITATIONS community activity.    PERSONAL FACTORS Age and Time since onset of injury/illness/exacerbation are also affecting patient's functional outcome.      REHAB POTENTIAL: Good   CLINICAL DECISION MAKING: Stable/uncomplicated   EVALUATION COMPLEXITY: Low     GOALS: Goals reviewed with patient? Yes   SHORT TERM GOALS: Target date: 05/13/2021   Pt to be I with HEP. Baseline:  Goal status: MET   2.  Pt to demonstrate at least 2/5 pelvic floor strength with minimal cues for improved ability to withhold stool and decrease leakage.  Baseline:  Goal status: MET   3.  Pt to demonstrate improved coordination with pelvic floor and breathing mechanics at least 50% of the time to decrease downward strain at pelvic floor and decrease  leakage.  Baseline:  Goal status: MET   4.  Pt to demonstrate ability to complete 5xSTS within 20s for improve mobility and decreased fall risk.  Baseline: Pt unable to complete at eval Goal status: ONGOING        LONG TERM GOALS: Target date:  07/16/21   Pt to be I with advanced HEP. Baseline:  Goal status: MET   2.  Pt to demonstrate at least 4/5 pelvic floor strength with minimal cues for improved ability to withhold stool and decrease leakage. Baseline: max cues for 1/5 Goal status: INITIAL   3.  Pt to demonstrate improved coordination with pelvic floor and breathing mechanics at least 75% of the time to decrease downward strain at pelvic floor and decrease leakage.  Baseline: max cues and poor coodination Goal status: MET   4.  Pt to demonstrate ability to complete 5xSTS within 17s for improve mobility and decreased fall risk.  Baseline: unable to complete at eval Goal status: on going - pt has been limited with ability to quickly transfer up/down due to her vertigo per pt   5.  Pt to demonstrate 4/5 bil hip strength grossly for improved mobility and pelvic stability. Baseline:  Goal status: MET   6.  Pt to demonstrate improved gait mechanics with LRAD for 300' mod I to decrease fall risk and improve effectiveness to bathroom and decrease fecal leakage.  Baseline:  Goal status: on going      PLAN: PT FREQUENCY: 1x/week   PT DURATION:  10 sessions   PLANNED INTERVENTIONS: Therapeutic exercises,  Therapeutic activity, Neuromuscular re-education, Balance training, Gait training, Patient/Family education, Joint mobilization, Stair training, Spinal mobilization, Cryotherapy, Moist heat, Manual lymph drainage, scar mobilization, Taping, and Manual therapy   PLAN FOR NEXT SESSION:  strengthening with pelvic floor coordination.  Stacy Gardner, PT, DPT 06/16/2310:25 PM     PHYSICAL THERAPY DISCHARGE SUMMARY  Visits from Start of Care: 8  Current functional level related  to goals / functional outcomes: Pt called requesting DC as she no longer has symptoms    Remaining deficits: None per pt   Education / Equipment: HEP   Patient agrees to discharge. Patient goals were partially met. Patient is being discharged due to being pleased with the current functional level.   Stacy Gardner, PT, DPT 06/28/2309:40 AM

## 2021-06-17 ENCOUNTER — Encounter: Payer: Self-pay | Admitting: Family Medicine

## 2021-06-17 ENCOUNTER — Ambulatory Visit: Payer: Medicare Other | Admitting: Family Medicine

## 2021-06-17 VITALS — BP 122/91 | HR 70 | Ht 67.0 in | Wt 183.0 lb

## 2021-06-17 DIAGNOSIS — E042 Nontoxic multinodular goiter: Secondary | ICD-10-CM

## 2021-06-17 DIAGNOSIS — R053 Chronic cough: Secondary | ICD-10-CM | POA: Diagnosis not present

## 2021-06-17 DIAGNOSIS — J300 Vasomotor rhinitis: Secondary | ICD-10-CM

## 2021-06-17 DIAGNOSIS — R413 Other amnesia: Secondary | ICD-10-CM

## 2021-06-17 DIAGNOSIS — R159 Full incontinence of feces: Secondary | ICD-10-CM

## 2021-06-17 DIAGNOSIS — N819 Female genital prolapse, unspecified: Secondary | ICD-10-CM

## 2021-06-17 MED ORDER — GUAIFENESIN-CODEINE 100-10 MG/5ML PO SOLN: 2.5000 mL | Freq: Three times a day (TID) | ORAL | 0 refills | Status: DC | PRN

## 2021-06-17 MED ORDER — AZELASTINE-FLUTICASONE 137-50 MCG/ACT NA SUSP: 1.0000 | Freq: Every day | NASAL | 12 refills | Status: DC

## 2021-06-17 MED ORDER — IPRATROPIUM BROMIDE 0.06 % NA SOLN: 2.0000 | Freq: Four times a day (QID) | NASAL | 12 refills | Status: DC

## 2021-06-17 NOTE — Patient Instructions (Signed)
We are setting you up for an Ultrasound of your thyroid gland in your neck.  It has some cysts in it that were seen back in 2021 on a Chest CAT scan.  We want to make sure the cysts are not getting bigger.   Please try stopping the Lasix (furosemide).  It likely is not helping your ankle swelling and is just one more medication that can cause you problems.

## 2021-06-18 ENCOUNTER — Encounter: Payer: Self-pay | Admitting: Family Medicine

## 2021-06-18 NOTE — Assessment & Plan Note (Signed)
Established problem that has improved and has meet goal of attedning social events.  Continue current medication regiment.

## 2021-06-18 NOTE — Assessment & Plan Note (Signed)
Established problem that has improved and has meet goal of going to social activities.  patient continuing pelvic rehab and has started metamucil capsules daily which has helped form her stools so she had her first "normal" stool in quite a while yesterday. Continue current medication regiment and physical therapy

## 2021-06-18 NOTE — Assessment & Plan Note (Addendum)
Established problem Per 03/2020 CTA Chest finding of Enlarged multinodular appearing thyroid lobes asymmetrically greater on LEFT similar to previous exam; this was previously assessed by thyroid ultrasound in September 2021 with recommendation for annual follow-up to demonstrate a total of 5 years of stability for multiple nodules. Thyroid US ordered for monitoring

## 2021-06-18 NOTE — Assessment & Plan Note (Signed)
PP found by Selena [name], Physical Therapy, during the patient's therapy for fecal incontinence. A referral to GYN was made by me based on this finding.  Selena [name] was contacted by GYN but did not schedule an appointment because she was not certain the reason for the referral. I explained the finding and how it can contribute to her urinary incontinence.  She will consider the referral.  She will contact the GYN office to make an appointment or decline the referral.

## 2021-06-18 NOTE — Assessment & Plan Note (Addendum)
Established problem that has improved and has meet goal of being able to attend social events, like church services/fellowship.  She attributes the ability to go to social events to the Robitussin AC prn addition to her Nasal sprays for rhinorrhea.  She denies falls, constipation, or worsening confusion with use of Robitussin AC before social events.  Plan Refill and continue Robitussin AC Refill and continue Dymista and Atrovent NS

## 2021-06-18 NOTE — Progress Notes (Signed)
Selena [name] is alone Sources of clinical information for visit is/are patient. Nursing assessment for this office visit was reviewed with the patient for accuracy and revision.     Previous Report(s) Reviewed: Pelvic Rehab Physical Therapy notes     05/06/2021   10:54 AM  Depression screen PHQ 2/9  Decreased Interest 1  Down, Depressed, Hopeless 0  PHQ - 2 Score 1  Altered sleeping 1  Tired, decreased energy 3  Change in appetite 0  Feeling bad or failure about yourself  3  Trouble concentrating 0  Moving slowly or fidgety/restless 0  Suicidal thoughts 0  PHQ-9 Score 8  Difficult doing work/chores Somewhat difficult   [place] Visit from 05/06/2021 in [place] Office Visit from 03/16/2021 in [place] Office Visit from 01/07/2021 in [place]  Thoughts that you would be better off dead, or of hurting yourself in some way Not at all Not at all Not at all  PHQ-9 Total Score '8 11 6          '$ 06/17/2021    2:09 PM 05/06/2021   10:54 AM 04/07/2021    9:54 AM 03/16/2021    1:26 PM 01/07/2021    1:55 PM  [place] in the past year? 0 0 0 0 1  Number falls in past yr: 0 0 0 0 0  Injury with Fall? 0 0 0  1       05/06/2021   10:54 AM 03/16/2021    1:27 PM 01/07/2021    1:56 PM  PHQ9 SCORE ONLY  PHQ-9 Total Score '8 11 6    '$ Adult vaccines due  Topic Date Due   TETANUS/TDAP  05/29/2028    Health Maintenance Due  Topic Date Due   Zoster Vaccines- Shingrix (1 of 2) Never done   COLONOSCOPY (Pts 45-16yr Insurance coverage will need to be confirmed)  06/16/2015      History/P.E. limitations: memory impairment  Adult vaccines due  Topic Date Due   TETANUS/TDAP  05/29/2028   There are no preventive care reminders to display for this patient.  Health Maintenance Due  Topic Date Due   Zoster Vaccines- Shingrix (1 of 2) Never done   COLONOSCOPY (Pts 45-453yrInsurance  coverage will need to be confirmed)  06/16/2015     Chief Complaint  Patient presents with   Follow-up   Fatigue

## 2021-06-18 NOTE — Assessment & Plan Note (Signed)
Selena [name] is asking about having her memory assessment at some point.  We will perform a MoCA at next office visit.

## 2021-06-22 ENCOUNTER — Encounter: Payer: Medicare Other | Admitting: Physical Therapy

## 2021-06-22 ENCOUNTER — Ambulatory Visit
Admission: RE | Admit: 2021-06-22 | Discharge: 2021-06-22 | Disposition: A | Discharge status: Home / Self Care | Payer: Medicare Other | Source: Ambulatory Visit | Attending: Family Medicine | Admitting: Family Medicine

## 2021-06-22 DIAGNOSIS — E042 Nontoxic multinodular goiter: Secondary | ICD-10-CM

## 2021-06-29 ENCOUNTER — Encounter: Payer: Medicare Other | Admitting: Physical Therapy

## 2021-06-29 ENCOUNTER — Encounter: Payer: Self-pay | Admitting: Family Medicine

## 2021-06-29 ENCOUNTER — Encounter: Payer: Self-pay | Admitting: *Deleted

## 2021-06-29 NOTE — Progress Notes (Unsigned)
Letter about thyroid US results and recommendation surveillance

## 2021-07-04 ENCOUNTER — Other Ambulatory Visit: Payer: Self-pay | Admitting: Nurse Practitioner

## 2021-07-06 ENCOUNTER — Encounter: Payer: Medicare Other | Admitting: Physical Therapy

## 2021-07-13 ENCOUNTER — Encounter: Payer: Medicare Other | Admitting: Physical Therapy

## 2021-07-19 ENCOUNTER — Telehealth: Payer: Self-pay | Admitting: Nurse Practitioner

## 2021-07-19 DIAGNOSIS — K625 Hemorrhage of anus and rectum: Secondary | ICD-10-CM

## 2021-07-19 NOTE — Telephone Encounter (Signed)
Patient called in to make Colleen, NP aware that Sunday morning she woke up with moderate amount of rectal bleeding (bright red blood on peri-pad). She says she does have a history of hemorrhoids, but has felt fine. Denies any pain, or  nausea/vomiting. She has been having normal bowel movements daily, currently taking metamucil BID. She said [employer] requested that she call to make [place] aware so that it is in her chart. Patient advised that if bleeding reoccurs & notices a larger amount, then to seek urgent care/ED.

## 2021-07-19 NOTE — Telephone Encounter (Signed)
Patients called stated she just wants to let the office know that she passed blood through her rectum on Sunday but she feeling fine just wanted it to be in her chart.

## 2021-07-20 NOTE — Telephone Encounter (Signed)
Patient has been scheduled a f/u with Jill Side, NP on 08/18/21 at 2:00 pm. She is aware that she will need to come in for lab work at earliest convenience. She states she will have to arrange transportation, but will have labs done prior to visit.

## 2021-07-26 ENCOUNTER — Other Ambulatory Visit: Payer: Self-pay | Admitting: Family Medicine

## 2021-07-26 DIAGNOSIS — R053 Chronic cough: Secondary | ICD-10-CM

## 2021-08-04 ENCOUNTER — Other Ambulatory Visit (INDEPENDENT_AMBULATORY_CARE_PROVIDER_SITE_OTHER): Payer: Medicare Other

## 2021-08-04 DIAGNOSIS — K625 Hemorrhage of anus and rectum: Secondary | ICD-10-CM

## 2021-08-04 LAB — CBC WITH DIFFERENTIAL/PLATELET
Basophils Absolute: 0.2 10*3/uL — ABNORMAL HIGH (ref 0.0–0.1)
Basophils Relative: 1.4 % (ref 0.0–3.0)
Eosinophils Absolute: 0.2 10*3/uL (ref 0.0–0.7)
Eosinophils Relative: 2.3 % (ref 0.0–5.0)
HCT: 45.7 % (ref 36.0–46.0)
Hemoglobin: 15 g/dL (ref 12.0–15.0)
Lymphocytes Relative: 12.3 % (ref 12.0–46.0)
Lymphs Abs: 1.3 10*3/uL (ref 0.7–4.0)
MCHC: 32.8 g/dL (ref 30.0–36.0)
MCV: 93.2 fl (ref 78.0–100.0)
Monocytes Absolute: 0.9 10*3/uL (ref 0.1–1.0)
Monocytes Relative: 8.1 % (ref 3.0–12.0)
Neutro Abs: 8.2 10*3/uL — ABNORMAL HIGH (ref 1.4–7.7)
Neutrophils Relative %: 75.9 % (ref 43.0–77.0)
Platelets: 191 10*3/uL (ref 150.0–400.0)
RBC: 4.9 Mil/uL (ref 3.87–5.11)
RDW: 14.4 % (ref 11.5–15.5)
WBC: 10.8 10*3/uL — ABNORMAL HIGH (ref 4.0–10.5)

## 2021-08-04 LAB — BASIC METABOLIC PANEL
BUN: 17 mg/dL (ref 6–23)
CO2: 29 mEq/L (ref 19–32)
Calcium: 9.5 mg/dL (ref 8.4–10.5)
Chloride: 105 mEq/L (ref 96–112)
Creatinine, Ser: 0.72 mg/dL (ref 0.40–1.20)
GFR: 77.11 mL/min (ref >60.00)
Glucose, Bld: 103 mg/dL — ABNORMAL HIGH (ref 70–99)
Potassium: 4 mEq/L (ref 3.5–5.1)
Sodium: 140 mEq/L (ref 135–145)

## 2021-08-17 ENCOUNTER — Other Ambulatory Visit: Payer: Self-pay | Admitting: Nurse Practitioner

## 2021-08-18 ENCOUNTER — Encounter: Payer: Self-pay | Admitting: Nurse Practitioner

## 2021-08-18 ENCOUNTER — Ambulatory Visit: Payer: Medicare Other | Admitting: Nurse Practitioner

## 2021-08-18 VITALS — BP 118/78 | HR 78 | Ht 67.0 in | Wt 177.4 lb

## 2021-08-18 DIAGNOSIS — K625 Hemorrhage of anus and rectum: Secondary | ICD-10-CM | POA: Diagnosis not present

## 2021-08-18 NOTE — Progress Notes (Signed)
08/18/2021 Selena [name] [id number] Oct 17, 1935   Chief Complaint: Follow-up rectal bleeding  History of Present Illness: Selena [name] is a 86 y.o. female with a past medical history of hypertension, diastolic CHF, atrial fibrillation on Eliquis, thoracic aneurysm, breast cancer stage I s/p right mastectomy 3664, follicular non-Hodgkin's lymphoma and upper GI bleed. Covid 19 pneumonia 02/2020.  She presents today for further evaluation regarding rectal bleeding.  She is accompanied by her daughter-in-law.  She awakened on Sunday 07/20/2021 and felt as if she needed to urinate, she went to the bathroom and saw a large amount of bright red blood in her depends then passed a small amount of bright red blood in the toilet without passing any stool.  No associated anorectal pain.  No abdominal pain.  No further rectal bleeding since then.  She remains on Eliquis due to history of atrial fibrillation.  She typically passes several small balls of stool followed by several smaller finger like stools daily without straining.  Labs 08/04/2021 showed a hemoglobin level of 15 (prior hemoglobin level 14.3 on 11/03/2020).  Dr. Rush Landmark was previously updated, considered scheduling a colonoscopy if no medical contraindications.  Her most recent colonoscopy was 06/16/2010 which showed possible segmental colitis in the sigmoid and descending colon segments with severe diverticulosis in the sigmoid to descending colon otherwise was normal. Mother and maternal aunt with history of colon cancer.  GERD symptoms controlled on Pantoprazole 40 mg daily.  Her main complaint today is a persistent cough since she contracted COVID-19 pneumonia 02/2020.  No SOB. She is scheduled to see her pulmonologist Dr. Vaughan Browner for further follow-up next month.  Imaging: CT 05/08/2019-mild interstitial changes with chronic scarring. CT high-resolution 02/02/2021-mild pulmonary fibrosis with basal gradient with groundglass opacities,  mild bronchiectasis.  Alternate pattern.  I have reviewed the images personally.   PFTs: 03/22/2021 FVC 2.37 [82%], FEV1 2.07 [91%], F/F 87, TLC 3.56 [64%], DLCO 17.92 [87%] Mild restriction     Latest Ref Rng & Units 08/04/2021   12:32 PM 11/03/2020   11:09 AM 08/19/2020    2:57 PM  CBC  WBC 4.0 - 10.5 K/uL 10.8  9.2  8.2   Hemoglobin 12.0 - 15.0 g/dL 15.0  14.3  14.8   Hematocrit 36.0 - 46.0 % 45.7  44.1  44.9   Platelets 150.0 - 400.0 K/uL 191.0  187.0  185.0        Latest Ref Rng & Units 08/04/2021   12:32 PM 01/19/2021    2:13 PM 11/03/2020   11:09 AM  CMP  Glucose 70 - 99 mg/dL 103  105  81   BUN 6 - 23 mg/dL '17  12  16   '$ Creatinine 0.40 - 1.20 mg/dL 0.72  0.72  0.68   Sodium 135 - 145 mEq/L 140  146  142   Potassium 3.5 - 5.1 mEq/L 4.0  4.4  4.0   Chloride 96 - 112 mEq/L 105  104  104   CO2 19 - 32 mEq/L '29  27  31   '$ Calcium 8.4 - 10.5 mg/dL 9.5  9.4  9.4   Total Protein 6.0 - 8.3 g/dL   6.3   Total Bilirubin 0.2 - 1.2 mg/dL   0.7   Alkaline Phos 39 - 117 U/L   100   AST 0 - 37 U/L   19   ALT 0 - 35 U/L   15     EGD 08/20/2019: - No gross lesions in esophagus. -  Z-line irregular. - 3 cm hiatal hernia. - Scar in the gastric fundus - Erythematous mucosa in the gastric body and antrum. Biopsied. - No gross lesions in the duodenal bulb, in the first portion of the duodenum and in the second portion of the duodenum.   EGD 05/07/2019:  Extrinsic impression with mild mucosal abnormality noted in the mid esophagus. Biopsied. - No other gross lesions in esophagus. - Widely patent and non-obstructing Schatzki ring. - 3 cm hiatal hernia. - Non-bleeding gastric ulcer with a small flat spot (Forrest Class IIc) was noted in the fundal region coming off of the region of the diaphragmatic hiatus region. This could be an ulcer on a subepithelial lesion such as a Leiomyoma or GIST, but did not want to tunnel biopsy today based on her clinical picture and status. - Many  non-bleeding gastric ulcers and erosions with a clean ulcer base (Forrest Class III) noted. Erythematous mucosa in the stomach. Biopsied for HP. - No gross lesions in the duodenal bulb, in the first portion of the duodenum and in the second portion of the duodenum  Colonoscopy 06/16/2010: showed possible segmental colitis in the sigmoid and descending colon segments with severe diverticulosis in the sigmoid to descending colon otherwise was normal.  Current Outpatient Medications on File Prior to Visit  Medication Sig Dispense Refill   albuterol (VENTOLIN HFA) 108 (90 Base) MCG/ACT inhaler Inhale 2 puffs into the lungs every 4 (four) hours as needed for wheezing or shortness of breath. 18 g 0   amLODipine (NORVASC) 5 MG tablet Take 1 tablet (5 mg total) by mouth daily. 90 tablet 3   apixaban (ELIQUIS) 5 MG TABS tablet TAKE 1 TABLET(5 MG) BY MOUTH TWICE DAILY 60 tablet 5   Azelastine-Fluticasone 137-50 MCG/ACT SUSP Place 1 puff into the nose daily. 23 g 12   famotidine (PEPCID) 20 MG tablet TAKE 1 TABLET(20 MG) BY MOUTH AT BEDTIME 90 tablet 0   fexofenadine (ALLEGRA ALLERGY) 180 MG tablet Take 1 tablet (180 mg total) by mouth daily. For rhinitis 30 tablet 0   fluticasone (FLOVENT HFA) 110 MCG/ACT inhaler Inhale 2 puffs into the lungs in the morning and at bedtime. 1 each 2   guaiFENesin-codeine 100-10 MG/5ML syrup TAKE 2.5 TO 5 ML BY MOUTH THREE TIMES DAILY AS NEEDED FOR COUGH 120 mL 0   hydrocortisone (ANUSOL-HC) 2.5 % rectal cream Place 1 application rectally 2 (two) times daily. 30 g 0   ipratropium (ATROVENT) 0.06 % nasal spray Place 2 sprays into both nostrils 4 (four) times daily. As needed for nasal congestion, runny nose 15 mL 12   metoprolol tartrate (LOPRESSOR) 25 MG tablet Take 1 tablet (25 mg total) by mouth 2 (two) times daily. 180 tablet 3   montelukast (SINGULAIR) 10 MG tablet TAKE 1 TABLET(10 MG) BY MOUTH AT BEDTIME 90 tablet 2   NON FORMULARY CBD oil     pantoprazole (PROTONIX) 40  MG tablet TAKE 1 TABLET(40 MG) BY MOUTH TWICE DAILY 180 tablet 1   predniSONE (DELTASONE) 5 MG tablet Take by mouth.     No current facility-administered medications on file prior to visit.   Allergies  Allergen Reactions   Codeine Phosphate Other (See Comments)    Hallucinations   Adhesive [Tape] Other (See Comments)    Electrodes for EKG- skin blistering, erythema   Codeine Other (See Comments)    Hallucinations    Current Medications, Allergies, Past Medical History, Past Surgical History, Family History and Social History were reviewed in  [facility] electronic medical record.  Review of Systems:   Constitutional: Negative for fever, sweats, chills or weight loss.  Respiratory: Negative for shortness of breath.   Cardiovascular: Negative for chest pain, palpitations and leg swelling.  Gastrointestinal: See HPI.  Musculoskeletal: Negative for back pain or muscle aches.  Neurological: Negative for dizziness, headaches or paresthesias.   Physical Exam: BP 118/78   Pulse 78   Ht '5\' 7"'$  (1.702 m)   Wt 177 lb 6 oz (80.5 kg)   SpO2 98%   BMI 27.78 kg/m   General: 86 year old female in no acute distress. Head: Normocephalic and atraumatic. Eyes: No scleral icterus. Conjunctiva pink . Ears: Normal auditory acuity. Mouth: Dentition intact. No ulcers or lesions.  Lungs: Clear throughout to auscultation. Heart: Regular rate and rhythm, no murmur. Abdomen: Soft, nontender and nondistended. No masses or hepatomegaly. Normal bowel sounds x 4 quadrants.  Rectal: Right external hemorrhoids inflamed/friable without active bleeding, circumferential anal hemorrhoids.  No blood, stool or palpable mass within reach.  Daughter in law present during exam. Musculoskeletal: Symmetrical with no gross deformities. Extremities: No edema. Neurological: Alert oriented x 4. No focal deficits.  Psychological: Alert and cooperative. Normal mood and affect  Assessment and Recommendations:  23)  86 year old female with a history of significant diverticular disease and hemorrhoids with painless hematochezia x 1 episode on 07/20/2021 without recurrence.  Diverticular versus diverticular bleeding.  Family history (mother and maternal aunt) of colon cancer. -Diagnostic colonoscopy deferred at this juncture as the patient is 68 with severe diverticulosis to the sigmoid/descending colon per colonoscopy 2012 with multiple co-morbidities including atrial fibrillation on Eliquis, chronic cough post COVID, diastolic CHF and thoracic aneurysm.  Further recommendations per Dr. Rush Landmark. -She will require pulmonary clearance if a future diagnostic colonoscopy pursued -She will contact our office if rectal bleeding recurs, she will present to the ED if she develops excessive rectal bleeding -Apply a small amount of Desitin inside the anal opening and to the external anal area tid as needed for anal or hemorrhoidal irritation/bleeding.  -Miralax 1 capful mixed in 8 ounces of water at bed time for constipation as tolerated.  Today's encounter was 25 minutes which included precharting, chart/result review, history/exam, face-to-face time used for counseling, formulating treatment plan with follow-up and documentation.

## 2021-08-18 NOTE — Progress Notes (Signed)
Attending Physician's Attestation   I have reviewed the chart.   I agree with the Advanced Practitioner's note, impression, and recommendations with any updates as below. Consider Anusol suppositories therapy as well in future. If further issues develop or this becomes a more significant amount of bleeding, consideration of sigmoidoscopy or colonoscopy on Eliquis could be considered to define and ensure nothing else is being missed but certainly would require discussion with pulmonary if that were to be the case.   Justice Britain, MD [facility] Advanced Endoscopy Office # [phone]

## 2021-08-18 NOTE — Patient Instructions (Addendum)
If you are age 84 or older, your body mass index should be between 23-30. Your Body mass index is 27.78 kg/m. If this is out of the aforementioned range listed, please consider follow up with your Primary Care Provider.  If you are age 41 or younger, your body mass index should be between 19-25. Your Body mass index is 27.78 kg/m. If this is out of the aformentioned range listed, please consider follow up with your Primary Care Provider.   ________________________________________________________  The [facility] providers would like to encourage you to use [facility] to communicate with providers for non-urgent requests or questions.  Due to long hold times on the telephone, sending your provider a message by [facility] may be a faster and more efficient way to get a response.  Please allow 48 business hours for a response.  Please remember that this is for non-urgent requests.  _______________________________________________________   1) Apply a small amount of Desitin inside the anal opening and to the external anal area tid as needed for anal or hemorrhoidal irritation/bleeding.   2) Take Miralax 1 capful mixed in 8 ounces of water at bed time for constipation as tolerated.  3) Contact our office if rectal bleeding recurs   4) Call Jaclyn Shaggy NP after you see you pulmonologist   It was a pleasure to see you today!  Thank you for trusting me with your gastrointestinal care!

## 2021-08-19 ENCOUNTER — Encounter: Payer: Self-pay | Admitting: Family Medicine

## 2021-08-19 ENCOUNTER — Ambulatory Visit: Payer: Medicare Other | Admitting: Family Medicine

## 2021-08-19 VITALS — BP 124/70 | HR 76 | Ht 67.0 in | Wt 176.0 lb

## 2021-08-19 DIAGNOSIS — J300 Vasomotor rhinitis: Secondary | ICD-10-CM

## 2021-08-19 DIAGNOSIS — F5104 Psychophysiologic insomnia: Secondary | ICD-10-CM

## 2021-08-19 DIAGNOSIS — R413 Other amnesia: Secondary | ICD-10-CM

## 2021-08-19 DIAGNOSIS — I4821 Permanent atrial fibrillation: Secondary | ICD-10-CM

## 2021-08-19 DIAGNOSIS — R053 Chronic cough: Secondary | ICD-10-CM

## 2021-08-19 DIAGNOSIS — I10 Essential (primary) hypertension: Secondary | ICD-10-CM

## 2021-08-19 MED ORDER — TRAZODONE HCL 50 MG PO TABS: 25.0000 mg | ORAL_TABLET | Freq: Every evening | ORAL | 3 refills | Status: DC | PRN

## 2021-08-19 NOTE — Patient Instructions (Addendum)
Try Trazodone 50 mg tablet, take half-tablet about a half-hour before you need to sleep.  You can take a whole tablet if the half tablet is not enough to get you to sleep.   I agree with the reasoning why you do not want to do the colonoscopy  I am glad to hear you will see your lung doctor soon.  I an glad to see you are going to the gynecologist.    Dr Dailin Sosnowski will ask our pharmacy folks to help you find out if there are price breaks on your Eliquis.   I like the idea of you using the massage machine to help with the pin in your hip.

## 2021-08-20 ENCOUNTER — Encounter: Payer: Self-pay | Admitting: Family Medicine

## 2021-08-20 NOTE — Assessment & Plan Note (Signed)
Established problem that has improved and has meet goal of significantly decrease vasomotor rhinorrhea.  Continue Astelin-Fluticasone NS daily and ATrovent NS prn

## 2021-08-20 NOTE — Progress Notes (Signed)
Selena [name] is alone Sources of clinical information for visit is/are patient. Nursing assessment for this office visit was reviewed with the patient for accuracy and revision.     Previous Report(s) Reviewed: Consult GI Berniece Pap, NP) office visit note 7-26 -23    08/19/2021    1:25 PM  Depression screen PHQ 2/9  Decreased Interest 1  Down, Depressed, Hopeless 2  PHQ - 2 Score 3  Altered sleeping 2  Tired, decreased energy 1  Change in appetite 0  Feeling bad or failure about yourself  1  Trouble concentrating 1  Moving slowly or fidgety/restless 0  PHQ-9 Score 8   [place] Office Visit from 08/19/2021 in [place] Office Visit from 05/06/2021 in [place] Office Visit from 03/16/2021 in [place]  Thoughts that you would be better off dead, or of hurting yourself in some way -- Not at all Not at all  PHQ-9 Total Score '8 8 11          '$ 08/19/2021    1:24 PM 06/17/2021    2:09 PM 05/06/2021   10:54 AM 04/07/2021    9:54 AM 03/16/2021    1:26 PM  [place] in the past year? 0 0 0 0 0  Number falls in past yr:  0 0 0 0  Injury with Fall?  0 0 0        08/19/2021    1:25 PM 05/06/2021   10:54 AM 03/16/2021    1:27 PM  PHQ9 SCORE ONLY  PHQ-9 Total Score '8 8 11    '$ Adult vaccines due  Topic Date Due   TETANUS/TDAP  05/29/2028    Health Maintenance Due  Topic Date Due   Zoster Vaccines- Shingrix (1 of 2) Never done   COLONOSCOPY (Pts 45-48yr Insurance coverage will need to be confirmed)  06/16/2015      History/P.E. limitations: none  Adult vaccines due  Topic Date Due   TETANUS/TDAP  05/29/2028   There are no preventive care reminders to display for this patient.  Health Maintenance Due  Topic Date Due   Zoster Vaccines- Shingrix (1 of 2) Never done   COLONOSCOPY (Pts 45-464yrInsurance coverage will need to be confirmed)  06/16/2015     Chief Complaint  Patient  presents with   Cough   Hip Pain    Visit Problem List with A/P  No problem-specific Assessment & Plan notes found for this encounter.

## 2021-08-20 NOTE — Assessment & Plan Note (Signed)
Established problem Well Controlled. No signs of complications, medication side effects, or red flags. Continue current medications and other regiments.  

## 2021-08-20 NOTE — Assessment & Plan Note (Addendum)
Established problem worsened. Robitussin AC is no longer helping.  She has not wanted a refill.  She continues to use her Nasal sprays and Singulair and Flovent meter dosed inhaler 110 microgram/act 2 puff inhaled BID  Maxillofacial CT 10/20/20 showed well-aerated sinuses.  Lung exam: right basilar fine crackles, no wheezing, no acc mm use.  Staccato coughing episodes last 3 to 5 seconds multiple times during visit.   Assessment and Plan  Stopping Prescription for Robitussin AC Continue NSprays, LTi, ICS, PPI with nighttime H2B Patient has pulmonary consult appointment coming up within next few weeks to follow up her ILD.  Patient will ask if there are other recommendations for controling her symptoms.

## 2021-08-20 NOTE — Assessment & Plan Note (Addendum)
Established problem Uncontrolled.  Patient is not at goal of adequate sleep.  Insomnia Onset: Years Sleep pattern over last 24 hours: Go to bed around 10 pm, does not fall asleep, gets up around 2 am - watches TV, goes back to bed around 4 am then sleeps until 9 to 10 am.  Frequency of Sleep Difficulty per week: several Pattern of Sleep Difficulty (intermittent or constant): intermittent Know OSA with CPAP  Sleep-related impariments: (Daytime function: able to complete iADLs;  Mood: mildly depressed; Fatigue: yes; Daytime Sleepiness: no; Errors or Accidents: no; Morning headaches: no;  Persistent worry about sleep: yes) Daily Exercise: inactive Daily Sunlight exposure: limited  Sleep Routine - Regularity of Bedtime: regular Partner in Bed: no Daytime napping: no  Pain or shortness or breath upon awakening from sleep: no Any identified causes of awakening (e.g., noise, family, nocturia, hot flashes, pain, cough, dreams): no Self-treatments: Non-pharmacologic: Took Tylenol PM, effective but left her with "fuzzy" head in morning     Does not drink alcohol.       Number of nighttime awakenings on average per night: none Awaken refreshed from sleep: no  Unintentional sleep during day, for example: Sitting and reading, Watching TV, Sitting inactive in a public place (e.g., a theater or a meeting), As a passenger in a car for an hour without a break, Sitting and talking to someone, In a car, while stopped for a few minutes in traffic:  no   History of Insomnia evaluations - Physician consultations: Sees Dr Gala Murdoch (Pulm - Sleep Specialist) Prior diagnostic testing: Sleep Study Prior therapies: CPAP   Urge to move the legs, usually associated with unpleasant leg sensations: no Orthopnea: no Heartburn / Indigestion at night: no Cough at night: yes, chronic cough, but does not awaekn her from sleep  Assessment and Plan Chronic Insomnia - Discussed sleep hygiene - Discouraged use of  OTC sleep aids, except melatonin. - trial Trazodone 25-50 mg qhs prn sleep

## 2021-08-20 NOTE — Assessment & Plan Note (Signed)
Established problem. Stable. Patient is at goal. No signs of complications, medication side effects, or red flags. Continue current medications and other regiments.

## 2021-08-30 ENCOUNTER — Ambulatory Visit
Admission: RE | Admit: 2021-08-30 | Discharge: 2021-08-30 | Disposition: A | Discharge status: Home / Self Care | Payer: Medicare Other | Source: Ambulatory Visit | Attending: Pulmonary Disease | Admitting: Pulmonary Disease

## 2021-08-30 ENCOUNTER — Inpatient Hospital Stay: Admission: RE | Admit: 2021-08-30 | Payer: Medicare Other | Source: Ambulatory Visit

## 2021-08-30 DIAGNOSIS — J849 Interstitial pulmonary disease, unspecified: Secondary | ICD-10-CM

## 2021-09-10 ENCOUNTER — Ambulatory Visit: Payer: Medicare Other | Admitting: Obstetrics and Gynecology

## 2021-09-10 ENCOUNTER — Encounter: Payer: Self-pay | Admitting: Obstetrics and Gynecology

## 2021-09-10 VITALS — BP 126/83 | HR 73 | Ht 65.5 in | Wt 177.0 lb

## 2021-09-10 DIAGNOSIS — N3281 Overactive bladder: Secondary | ICD-10-CM | POA: Diagnosis not present

## 2021-09-10 DIAGNOSIS — N816 Rectocele: Secondary | ICD-10-CM | POA: Diagnosis not present

## 2021-09-10 DIAGNOSIS — N8110 Cystocele, unspecified: Secondary | ICD-10-CM

## 2021-09-10 DIAGNOSIS — R350 Frequency of micturition: Secondary | ICD-10-CM

## 2021-09-10 DIAGNOSIS — N993 Prolapse of vaginal vault after hysterectomy: Secondary | ICD-10-CM | POA: Diagnosis not present

## 2021-09-10 DIAGNOSIS — R159 Full incontinence of feces: Secondary | ICD-10-CM

## 2021-09-10 LAB — POCT URINALYSIS DIPSTICK
Bilirubin, UA: NEGATIVE
Glucose, UA: NEGATIVE
Ketones, UA: NEGATIVE
Leukocytes, UA: NEGATIVE
Nitrite, UA: NEGATIVE
Protein, UA: NEGATIVE
Spec Grav, UA: 1.02 (ref 1.010–1.025)
Urobilinogen, UA: 0.2 E.U./dL
pH, UA: 6 (ref 5.0–8.0)

## 2021-09-10 NOTE — Progress Notes (Signed)
[facility] New Patient Evaluation and Consultation  Referring Provider: McDiarmid, Blane Ohara, MD PCP: McDiarmid, Blane Ohara, MD Date of Service: 09/10/2021  SUBJECTIVE Chief Complaint: New Patient (Initial Visit) (Selena [name] is a 86 y.o. female complains of incontinence./)  History of Present Illness: Selena [name] is a 86 y.o. White or Caucasian female seen in consultation at the request of Dr. McDiarmid for evaluation of prolapse.    Review of records significant for: Has history of bowel leakage and has been referred to pelvic floor physical therapy.   Urinary Symptoms: Leaks urine with with a full bladder, with movement to the bathroom, and with urgency Does not leak every day. Does not leak every time she coughs.  Pad use: sometimes will wear diaper She is bothered by her UI symptoms.  Day time voids 5.  Nocturia: 2 times per night to void. Voiding dysfunction: she empties her bladder well.  does not use a catheter to empty bladder.  When urinating, she feels she has no difficulties Drinks: 2 cups coffee, 2 bottles of water, gatorade, 16oz sweet tea per day. Does not drink in the evenings.   UTIs: 1 UTI's in the last year.   Denies history of blood in urine and kidney or bladder stones  Pelvic Organ Prolapse Symptoms:                  She Denies a feeling of a bulge the vaginal area.   Bowel Symptom: Bowel movements: 4-5 time(s) per day Stool consistency: has small "rabbit pellets", normal slim stools, then also loose stools Straining: no.  Splinting: no.  Incomplete evacuation: no.  She Admits to accidental bowel leakage / fecal incontinence  Occurs: 2x time(s) per day  Consistency with leakage: liquid Bowel regimen: metamucil- 2 pills in AM and PM- this has helped with some of the bowel issues.  Last colonoscopy: Date 05/2010, Results: showed possible segmental colitis in the sigmoid and descending colon segments with severe diverticulosis in the  sigmoid to descending colon otherwise was normal.  - Recent colonoscopy deferred by GI due to multiple medical comorbidities and age - Has done physical therapy and is sometimes doing exercises on her own at home.   Sexual Function Sexually active: no.   Pelvic Pain Denies pelvic pain   Past Medical History:  Past Medical History:  Diagnosis Date   Acute gastric ulcer with hemorrhage    Acute on chronic diastolic congestive heart failure ([place]) 10/16/2018   AKI (acute kidney injury) ([place])    Anemia    PMH   Aneurysm (arteriovenous) of coronary vessels    Ankle edema, bilateral 01/08/2021   Atrial fibrillation ([place]) 01/01/2007   BACK PAIN, UPPER 07/09/2008   Breast cancer of upper-outer quadrant of right female breast ([place]) 03/21/2011   Breast cancer, stage 1 ([place])    Bruises easily    Cataract    history   Complication of anesthesia    COVID-19 virus infection 03/08/2020   Diverticulitis    DIVERTICULOSIS, COLON 12/26/2006   Essential hypertension 92/11/9415   Follicular 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 ([place]) 40/81/4481   Follicular non-Hodgkin's lymphoma ([name])    Frozen shoulder, Left 01/08/2021   Full dentures 01/08/2021   Goiter    multi-nodular   Gustatory rhinitis 01/07/2021   History of atrioventricular nodal ablation 10/16/2018   Hx of colonoscopy 2009   Influenza 12/18/2020   Insomnia 09/10/2014   LIVER FUNCTION TESTS, ABNORMAL, HX OF 12/26/2007  Macular degeneration, bilateral 01/08/2021   Memory loss 05/16/2007   Nocturnal hypoxemia 05/14/2020   OSTEOARTHRITIS 12/26/2006   takes Diclofenac daily but has stopped for surgery   Osteoarthritis 12/26/2006   Qualifier: Diagnosis of  By: Paulina Fusi, RN, Daine Gravel    Paraspinal mass 07/16/2014   Pneumonia 11/11/2017   Pneumonia 01/31/2018   Pneumonia due to COVID-19 virus 03/08/2020   PONV (postoperative nausea and vomiting)    S/P ablation of atrial flutter 07/17/2018   Sensorineural hearing loss  (SNHL) of both ears 01/07/2021   Sepsis ([place]) 04/02/2020   Shortness of breath 05/28/2019   Skin cancer    Syncope 08/21/2018   Thoracic aortic aneurysm ([place]) 10/16/2018   Tibialis posterior tendinopathy 04/08/2019   Upper airway cough syndrome 10/27/2017   pfts 10/10/17 s sign airflow obst though the insp loop is somewhat flattened  - Allergy profile 10/26/2017 >  Eos 0.3 /  IgE  6 RAST neg - rx for gerd 10/26/17 > improved 12/07/2017    VERTIGO 09/07/2009   Has had Vestibular Rehab consultation   Visual impairment 01/07/2021   Wears dentures    Wears glasses    Wears hearing aid    bilateral     Past Surgical History:   Past Surgical History:  Procedure Laterality Date   ABDOMINAL HYSTERECTOMY     ABLATION OF DYSRHYTHMIC FOCUS  2009   APPENDECTOMY     AUGMENTATION MAMMAPLASTY Right 2013   Subpectoral implant   BIOPSY  05/07/2019   Procedure: BIOPSY;  Surgeon: Irving Copas., MD;  Location: [facility];  Service: Gastroenterology;;   BREAST BIOPSY  2013   BREAST RECONSTRUCTION  04/14/2011   Procedure: BREAST RECONSTRUCTION;  Surgeon: Crissie Reese, MD;  Location: [facility];  Service: Plastics;  Laterality: Right;  Placement of Right Breast Tissue Expander with  use of Flex HD for Breast Reconstruction   BREAST SURGERY     right sm, snbx   CATARACT EXTRACTION W/ INTRAOCULAR LENS  IMPLANT, BILATERAL  2010   bilateral   COLONOSCOPY     COLONOSCOPY     ESOPHAGOGASTRODUODENOSCOPY (EGD) WITH PROPOFOL N/A 05/07/2019   Procedure: ESOPHAGOGASTRODUODENOSCOPY (EGD) WITH PROPOFOL;  Surgeon: Irving Copas., MD;  Location: [facility];  Service: Gastroenterology;  Laterality: N/A;   KNEE SURGERY  2004/2010   arthroscopic/ bilateral knee replacements   LOOP RECORDER IMPLANT     MASTECTOMY Right 2013   PORT-A-CATH REMOVAL N/A 12/07/2016   Procedure: REMOVAL PORT-A-CATH;  Surgeon: Rolm Bookbinder, MD;  Location: [facility];  Service: General;  Laterality: N/A;   PORTACATH  PLACEMENT Right 08/11/2014   Procedure: INSERTION PORT-A-CATH WITH ULTRASOUND;  Surgeon: Rolm Bookbinder, MD;  Location: [facility];  Service: General;  Laterality: Right;   [place]   right   tmi  1998   TONSILLECTOMY     TOTAL KNEE ARTHROPLASTY Bilateral      Past OB/GYN History: OB History  Gravida Para Term Preterm AB Living  '2 2 2   '$ 0 2  SAB IAB Ectopic Multiple Live Births  0       2    # Outcome Date GA Lbr Len/2nd Weight Sex Delivery Anes PTL Lv  2 Term         LIV  1 Term      Vag-Spont   LIV    Vaginal deliveries: 2,  Forceps/ Vacuum deliveries: 0, Cesarean section: 0 S/p hysterectomy   Medications: She has a current medication list which  includes the following prescription(s): albuterol, amlodipine, apixaban, azelastine-fluticasone, famotidine, fluticasone, guaifenesin-codeine, ipratropium, metoprolol tartrate, montelukast, NON FORMULARY, pantoprazole, prednisone, and trazodone.   Allergies: Patient is allergic to codeine phosphate, adhesive [tape], and codeine.   Social History:  Social History   Tobacco Use   Smoking status: Former    Packs/day: 0.10    Years: 20.00    Total pack years: 2.00    Types: Cigarettes    Quit date: 01/24/1978    Years since quitting: 43.6    Passive exposure: Past   Smokeless tobacco: Never  Vaping Use   Vaping Use: Never used  Substance Use Topics   Alcohol use: No   Drug use: No    Relationship status: widowed She lives alone She is not employed. Regular exercise: No History of abuse: No  Family History:   Family History  Problem Relation Age of Onset   Colon cancer Mother    Cancer Mother        colon   Colon cancer Maternal Aunt    Cancer Maternal Uncle    Prostate cancer Maternal Uncle    Prostate cancer Maternal Uncle    Other Maternal Uncle    Cancer Sister        kidney   Cancer Brother        lymph node   Anesthesia problems Neg Hx    Hypotension Neg Hx    Malignant hyperthermia Neg  Hx    Pseudochol deficiency Neg Hx    Breast cancer Neg Hx      Review of Systems: Review of Systems  Constitutional:  Positive for weight loss. Negative for fever and malaise/fatigue.  Respiratory:  Positive for cough, shortness of breath and wheezing.   Cardiovascular:  Positive for leg swelling. Negative for chest pain and palpitations.  Gastrointestinal:  Negative for abdominal pain and blood in stool.  Genitourinary:  Negative for dysuria.  Musculoskeletal:  Positive for myalgias.  Skin:  Negative for rash.  Neurological:  Positive for dizziness. Negative for headaches.  Endo/Heme/Allergies:  Bruises/bleeds easily.  Psychiatric/Behavioral:  Negative for depression. The patient is not nervous/anxious.      OBJECTIVE Physical Exam: Vitals:   09/10/21 1054  BP: 126/83  Pulse: 73  Weight: 177 lb (80.3 kg)  Height: 5' 5.5" (1.664 m)    Physical Exam Constitutional:      General: She is not in acute distress. Pulmonary:     Effort: Pulmonary effort is normal.  Abdominal:     General: There is no distension.     Palpations: Abdomen is soft.     Tenderness: There is no abdominal tenderness. There is no rebound.  Musculoskeletal:        General: No swelling. Normal range of motion.  Skin:    General: Skin is warm and dry.     Findings: No rash.  Neurological:     Mental Status: She is alert and oriented to person, place, and time.  Psychiatric:        Mood and Affect: Mood normal.        Behavior: Behavior normal.      GU / Detailed Urogynecologic Evaluation:  Pelvic Exam: Normal external female genitalia; Bartholin's and Skene's glands normal in appearance; urethral meatus normal in appearance, no urethral masses or discharge.   CST: negative  s/p hysterectomy: Speculum exam reveals normal vaginal mucosa with  atrophy and normal vaginal cuff.  Adnexa no mass, fullness, tenderness.     Pelvic floor strength 0/V-  pushes and unable to contract muscles  Pelvic  floor musculature: Right levator non-tender, Right obturator non-tender, Left levator non-tender, Left obturator non-tender  POP-Q:   POP-Q  0                                            Aa   0                                           Ba  -5                                              C   4                                            Gh  4                                            Pb  7                                            tvl   0                                            Ap  0                                            Bp                                                 D     Rectal Exam:  Normal external rectum  Post-Void Residual (PVR) by Bladder Scan: In order to evaluate bladder emptying, we discussed obtaining a postvoid residual and she agreed to this procedure.  Procedure: The ultrasound unit was placed on the patient's abdomen in the suprapubic region after the patient had voided. A PVR of 21 ml was obtained by bladder scan.  Laboratory Results: POC urine: trace blood   ASSESSMENT AND PLAN Ms. Braun is a 86 y.o. with:  1. Overactive bladder   2. Prolapse of anterior vaginal wall   3. Prolapse of posterior vaginal wall   4. Vaginal vault prolapse after hysterectomy   5. Urinary frequency   6. Incontinence of feces, unspecified fecal incontinence type    OAB - We discussed the symptoms of overactive bladder (OAB), which include urinary urgency, urinary frequency, nocturia, with or without urge  incontinence.  While we do not know the exact etiology of OAB, several treatment options exist. We discussed management including behavioral therapy (decreasing bladder irritants, urge suppression strategies, timed voids, bladder retraining), physical therapy, medication.  - She will start with reducing bladder irritants- coffee, tea. She is considering pelvic PT if she does not see improvement.   2. Stage II anterior, Stage II posterior, Stage I apical  prolapse - currently asymptomatic from her prolapse, will expectantly manage  3. Fecal incontinence - Encouraged to continue with PT exercises that she learned, and discussed new referral if needed. Was unable to demonstrate appropriate squeeze of muscles today.   Return 3 months or sooner if needed  Jaquita Folds, MD

## 2021-09-10 NOTE — Patient Instructions (Signed)

## 2021-09-14 ENCOUNTER — Ambulatory Visit: Payer: Medicare Other | Admitting: Pulmonary Disease

## 2021-09-14 ENCOUNTER — Encounter: Payer: Self-pay | Admitting: Pulmonary Disease

## 2021-09-14 VITALS — BP 116/62 | HR 77 | Temp 97.8°F | Ht 65.0 in | Wt 178.2 lb

## 2021-09-14 DIAGNOSIS — Z9989 Dependence on other enabling machines and devices: Secondary | ICD-10-CM | POA: Diagnosis not present

## 2021-09-14 DIAGNOSIS — J849 Interstitial pulmonary disease, unspecified: Secondary | ICD-10-CM

## 2021-09-14 DIAGNOSIS — G4733 Obstructive sleep apnea (adult) (pediatric): Secondary | ICD-10-CM | POA: Diagnosis not present

## 2021-09-14 MED ORDER — CHLORPHENIRAMINE MALEATE 4 MG PO TABS: 4.0000 mg | ORAL_TABLET | Freq: Two times a day (BID) | ORAL | 5 refills | Status: DC | PRN

## 2021-09-14 NOTE — Patient Instructions (Signed)
His CT scan is stable which is good news For the cough we will send in a prescription for chlorpheniramine 4 mg twice daily.  Taking the second dose at night before going to bed Continue your nasal spray and antiacid medication Elevate the head of the bed Follow-up in 6 months.

## 2021-09-14 NOTE — Progress Notes (Signed)
Asia Dusenbury    [id number]    09-Nov-1935  Primary Care Physician:McDiarmid, Blane Ohara, MD  Referring Physician: McDiarmid, Blane Ohara, MD [address] [place],  [place] [address]  Chief complaint: Consult for dyspnea  HPI: 86 year old with history of OSA on CPAP, CHF, breast cancer, hypertension, non-Hodgkin's lymphoma, chronic A-fib on Eliquis Referred by Dr. Ander Slade for evaluation of abnormal CT scan She has history of chronic intermittent dyspnea on exertion.  No symptoms at rest.  History notable for COVID-19 pneumonia requiring hospitalization February 2022.  Had a repeat hospitalization in March 2022 with severe sepsis due to E. coli bacteremia and UTI.  She has history of mild OSA on CPAP and follows with Dr. 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  No feather pillows or comforter ILD questionnaire 03/23/2020-negative Smoking history: Remote smoking history.  Quit in the 1980s Travel history: No significant travel history Relevant family history: No family history of lung disease  Interim history: Here for review of follow-up CT Continues to have significant nasal congestion with discharge and cough which is affecting her quality of life  Outpatient Encounter Medications as of 09/14/2021  Medication Sig   amLODipine (NORVASC) 5 MG tablet Take 1 tablet (5 mg total) by mouth daily.   apixaban (ELIQUIS) 5 MG TABS tablet TAKE 1 TABLET(5 MG) BY MOUTH TWICE DAILY   Azelastine-Fluticasone 137-50 MCG/ACT SUSP Place 1 puff into the nose daily.   famotidine (PEPCID) 20 MG tablet Take 20 mg by mouth 2 (two) times daily.   fluticasone (FLOVENT HFA) 110 MCG/ACT inhaler Inhale 2 puffs into the lungs in the morning and at bedtime.   guaiFENesin-codeine 100-10 MG/5ML syrup TAKE 2.5 TO 5 ML BY MOUTH THREE TIMES DAILY AS NEEDED FOR COUGH   metoprolol tartrate (LOPRESSOR) 25 MG tablet Take 1 tablet (25 mg  total) by mouth 2 (two) times daily.   montelukast (SINGULAIR) 10 MG tablet TAKE 1 TABLET(10 MG) BY MOUTH AT BEDTIME   NON FORMULARY CBD oil   pantoprazole (PROTONIX) 40 MG tablet TAKE 1 TABLET(40 MG) BY MOUTH TWICE DAILY   predniSONE (DELTASONE) 5 MG tablet Take by mouth.   traZODone (DESYREL) 50 MG tablet Take 0.5-1 tablets (25-50 mg total) by mouth at bedtime as needed for sleep.   albuterol (VENTOLIN HFA) 108 (90 Base) MCG/ACT inhaler Inhale 2 puffs into the lungs every 4 (four) hours as needed for wheezing or shortness of breath. (Patient not taking: Reported on 09/14/2021)   [DISCONTINUED] ipratropium (ATROVENT) 0.06 % nasal spray Place 2 sprays into both nostrils 4 (four) times daily. As needed for nasal congestion, runny nose   No facility-administered encounter medications on file as of 09/14/2021.    Physical Exam: Blood pressure 116/62, pulse 77, temperature 97.8 F (36.6 C), temperature source Oral, height '5\' 5"'$  (1.651 m), weight 178 lb 3.2 oz (80.8 kg), SpO2 98 %. Gen:      No acute distress HEENT:  EOMI, sclera anicteric Neck:     No masses; no thyromegaly Lungs:    Clear to auscultation bilaterally; normal respiratory effort CV:         Regular rate and rhythm; no murmurs Abd:      + bowel sounds; soft, non-tender; no palpable masses, no distension Ext:    No edema; adequate peripheral perfusion Skin:      Warm and dry; no rash Neuro: alert and oriented  x 3 Psych: normal mood and affect   Data Reviewed: Imaging: CT 05/08/2019-mild interstitial changes with chronic scarring. CT high-resolution 02/02/2021-mild pulmonary fibrosis with basal gradient with groundglass opacities, mild bronchiectasis.  Alternate pattern.   CT high-resolution 08/30/2021-stable pattern of pulmonary fibrosis. I have reviewed the images personally.  PFTs: 03/22/2021 FVC 2.37 [82%], FEV1 2.07 [91%], F/F 87, TLC 3.56 [64%], DLCO 17.92 [87%] Mild restriction  Labs: ANA, rheumatoid factor 02/09/2021-  negative IgE 10/26/2017-6  Assessment:  Evaluation for ILD CT shows mild changes in indeterminate/alternate pattern.  She does have some mild interstitial changes prior to COVID but worsened after her hospitalization for COVID-19 pneumonia.  ANA, rheumatoid factor negative earlier this year.  She does not have any exposures or CTD serologies  CT is stable and we will continue to monitor for now  Postnasal drip, GERD Continue nasal spray, antiacid medication Try chlorpheniramine twice daily  OSA Continues on CPAP.  Follows with Dr. Ander Slade  Plan/Recommendations: Follow-up in 6 months Nasal spray, antiacid medication Chlorpheniramine  Marshell Garfinkel MD [facility] 09/14/2021, 3:03 PM  CC: McDiarmid, Blane Ohara, MD

## 2021-09-15 ENCOUNTER — Encounter: Payer: Self-pay | Admitting: Family Medicine

## 2021-09-15 DIAGNOSIS — I272 Pulmonary hypertension, unspecified: Secondary | ICD-10-CM | POA: Insufficient documentation

## 2021-09-15 DIAGNOSIS — K224 Dyskinesia of esophagus: Secondary | ICD-10-CM | POA: Insufficient documentation

## 2021-09-23 ENCOUNTER — Ambulatory Visit: Payer: Medicare Other | Admitting: Family Medicine

## 2021-09-23 ENCOUNTER — Encounter: Payer: Self-pay | Admitting: Family Medicine

## 2021-09-23 VITALS — BP 122/70 | HR 74 | Ht 65.0 in | Wt 174.0 lb

## 2021-09-23 DIAGNOSIS — G3184 Mild cognitive impairment, so stated: Secondary | ICD-10-CM

## 2021-09-23 DIAGNOSIS — N819 Female genital prolapse, unspecified: Secondary | ICD-10-CM

## 2021-09-23 DIAGNOSIS — I4821 Permanent atrial fibrillation: Secondary | ICD-10-CM | POA: Diagnosis not present

## 2021-09-23 DIAGNOSIS — N3281 Overactive bladder: Secondary | ICD-10-CM | POA: Insufficient documentation

## 2021-09-23 DIAGNOSIS — Z604 Social exclusion and rejection: Secondary | ICD-10-CM | POA: Diagnosis not present

## 2021-09-23 DIAGNOSIS — H6192 Disorder of left external ear, unspecified: Secondary | ICD-10-CM

## 2021-09-23 HISTORY — DX: Mild cognitive impairment of uncertain or unknown etiology: G31.84

## 2021-09-23 NOTE — Patient Instructions (Signed)
You have a decrease in your memory.  This is called Mild Cognitive Impairment.  It is not Alzheimer Dementia.   The Mild Cognitive Impairment can get worse over time and become dementia.  It is important to take care of your legal paperwork now, in case your thinking and memory get worse.   You should designate someone you trust to be your Horace to handle your health care matters if you could not and some one to be your Financial Power of Attorney to handle your finances if you could not.    Finally, you want to complete a Living Will form to let your health care Power of Attorney and family know what you would want done for your healthcare if you could not speak for yourself.   Complete the Living Will and [place] forms received  during your office visit today.  Your signature on these documents must be notarized to be legal documents.  Once you have notarized the documents, make many copies.  Let everyone know that these documents exist.  Send a copy to your primary care doctor to but in your medical record.   Dr Rylah Fukuda would like to see you back in about a month to fill out your Living Will and get it notarized.

## 2021-09-23 NOTE — Assessment & Plan Note (Addendum)
New concern Lesion suspicious for basal cell cancer with rolled border and associated telangectasia on superior helix of ear [name] plans on contacting her dermatologist, Dr Nevada Crane, for further evaluation and treatment.

## 2021-09-23 NOTE — Progress Notes (Signed)
Provider:  Lissa Morales, MD  PCP: Rayfield Beem, Blane Ohara, MD Patient Care Team: Chisa Kushner, Blane Ohara, MD as PCP - General (Family Medicine) Burnell Blanks, MD as PCP - Cardiology (Cardiology) Laurin Coder, MD as Consulting Physician (Pulmonary Disease) Mcarthur Rossetti, MD as Consulting Physician (Orthopedic Surgery) Nicholas Lose, MD as Consulting Physician (Hematology and Oncology) [facility], Melanie Crazier, MD as Consulting Physician (Endocrinology) Noralyn Pick, NP as Nurse Practitioner (Gastroenterology) Evans Lance, MD as Consulting Physician (Cardiology) Rolm Bookbinder, MD as Consulting Physician (General Surgery)  Extended Emergency Contact Information Primary Emergency Contact: Belenda, Alviar Mobile Phone: [phone] Relation: Son Secondary Emergency Contact: Joana Reamer Address: [address]          [place], [place] [address] [name] Phone: [phone] Relation: Daughter  Code Status: DNR Goals of Care: Advanced Directive information    09/23/2021    1:34 PM  Advanced Directives  Does Patient Have a Medical Advance Directive? Yes  Type of Advance Directive [place] in Chart? Yes - validated most recent copy scanned in chart (See row information)     [facility]:   Patient is alone Primary caregiver:  brother, daughter, and daughter-in-law  Patient's Currently living arrangement:  alone, but family nearby Patient information was obtained from      patient, past medical records, and Urogynecology consultation and Pulmonology consultation. History/Exam limitations:   none    HPI by problems:  Chief Complaint  Patient presents with   Memory Loss    Cognitive impairment concern  Are there problems with thinking?  Cognition domains: Memory difficulties  When were the changes first noticed?  year  Did this change occur abruptly or gradually?  gradual  How have the changes progressed since then?  gradually worsening  Has there been any tremors or abnormal movements?  no   Compared to 5 to 10 years ago, how is the patient at:  Problems with Judgment, e.g., problem making decisions, bad financial decisions, problems with thinking?  no  Less interested in hobbies or previously enjoyed activities?  no  Problem remembering things about family and friends e.g. names,  occupations, birthdays, addresses?  no  Problem remembering conversations or news events a few days later?  yes  Problem remembering what day and month it is? no  Problem with losing things?  yes, but is able to find them on her own after searching  Problem learning to use a new gadget or machine around the house, e.g., cell phones, computer, microwave, remote control?  Yes, using her new phone to make phone calls  Problem with handling money for shopping?  no  Problem handling financial matters, e.g. their pension, checking, credit cards, dealing with the bank?  no  PHQ-9: [place] Office Visit from 09/23/2021 in [facility]  PHQ-9 Total Score 0        Outpatient Encounter Medications as of 09/23/2021  Medication Sig   albuterol (VENTOLIN HFA) 108 (90 Base) MCG/ACT inhaler Inhale 2 puffs into the lungs every 4 (four) hours as needed for wheezing or shortness of breath. (Patient not  taking: Reported on 09/14/2021)   amLODipine (NORVASC) 5 MG tablet Take 1 tablet (5 mg total) by mouth daily.   apixaban (ELIQUIS) 5 MG TABS tablet TAKE 1 TABLET(5 MG) BY MOUTH TWICE DAILY   Azelastine-Fluticasone 137-50 MCG/ACT SUSP Place 1 puff into the nose daily.   chlorpheniramine (  CHLOR-TRIMETON) 4 MG tablet Take 1 tablet (4 mg total) by mouth 2 (two) times daily as needed for allergies.   famotidine (PEPCID) 20 MG tablet Take 20 mg by mouth 2 (two) times daily.   fluticasone (FLOVENT HFA) 110 MCG/ACT inhaler Inhale 2 puffs into the lungs in the morning and at bedtime.   guaiFENesin-codeine 100-10 MG/5ML syrup TAKE 2.5 TO 5 ML BY MOUTH THREE TIMES DAILY AS NEEDED FOR COUGH   metoprolol tartrate (LOPRESSOR) 25 MG tablet Take 1 tablet (25 mg total) by mouth 2 (two) times daily.   montelukast (SINGULAIR) 10 MG tablet TAKE 1 TABLET(10 MG) BY MOUTH AT BEDTIME   NON FORMULARY CBD oil   pantoprazole (PROTONIX) 40 MG tablet TAKE 1 TABLET(40 MG) BY MOUTH TWICE DAILY   predniSONE (DELTASONE) 5 MG tablet Take by mouth.   traZODone (DESYREL) 50 MG tablet Take 0.5-1 tablets (25-50 mg total) by mouth at bedtime as needed for sleep.   No facility-administered encounter medications on file as of 09/23/2021.    History Patient Active Problem List   Diagnosis Date Noted   Interstitial lung disease ([place])     Priority: High   Follicular 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 ([place]) 07/31/2014    Priority: High   Atrial fibrillation, permanent ([place]) 01/01/2007    Priority: High   Mild cognitive impairment 09/23/2021    Priority: Medium    OAB (overactive bladder) 09/23/2021    Priority: Medium    Esophageal dysmotility 09/15/2021    Priority: Medium    Pelvic prolapse 06/08/2021    Priority: Medium    Restrictive lung disease 04/05/2021    Priority: Medium    OSA on CPAP 01/07/2021    Priority: Medium    Visual impairment 01/07/2021    Priority: Medium    Multinodular thyroid  recommended annual [place] surveillance 01/07/2021    Priority: Medium    Chronic coughing 05/28/2019    Priority: Medium    Chronic vasomotor rhinitis 10/27/2017    Priority: Medium    Essential hypertension 10/27/2017    Priority: Medium    Incontinence of feces 04/08/2021    Priority: Low   Macular degeneration, bilateral 01/08/2021    Priority: Low   Ankle edema, bilateral 01/08/2021    Priority: Low   Sensorineural hearing loss (SNHL) of both ears 01/07/2021    Priority: Low   Falls frequently 04/02/2020    Priority: Low   Frozen shoulder, Left 11/11/2017    Priority: Low   Psychophysiologic insomnia 09/10/2014    Priority: Low   Osteoarthritis 12/26/2006    Priority: Low   Skin lesion of left external ear 09/23/2021   Breast cancer of upper-outer quadrant of right female breast ([place]) 03/21/2011   Past Medical History:  Diagnosis Date   Acute gastric ulcer with hemorrhage    Acute on chronic diastolic congestive heart failure ([place]) 10/16/2018   AKI (acute kidney injury) ([place])    Anemia    PMH   Aneurysm (arteriovenous) of coronary vessels    Ankle edema, bilateral 01/08/2021   Atrial fibrillation ([place]) 01/01/2007   BACK PAIN, UPPER 07/09/2008   Breast cancer of upper-outer quadrant of right female breast ([place]) 03/21/2011   Breast cancer, stage 1 ([place])    Bruises easily    Cataract    history   Complication of anesthesia    COVID-19 virus infection 03/08/2020   Diverticulitis    DIVERTICULOSIS, COLON 12/26/2006   Essential hypertension 41/96/2229   Follicular lymphoma  grade I of intra-abdominal lymph nodes ([place]) 66/59/9357   Follicular non-Hodgkin's lymphoma ([place])    Frozen shoulder, Left 01/08/2021   Full dentures 01/08/2021   Goiter    multi-nodular   Gustatory rhinitis 01/07/2021   Hemorrhoids 04/08/2021   History of atrioventricular nodal ablation 10/16/2018   Hx of colonoscopy 2009   Incontinence of feces 04/08/2021   Influenza 12/18/2020   Insomnia  09/10/2014   LIVER FUNCTION TESTS, ABNORMAL, HX OF 12/26/2007   Macular degeneration, bilateral 01/08/2021   Memory loss 05/16/2007   Mild cognitive impairment 09/23/2021   Qualifier: Diagnosis of  By: Burnice Logan  MD, Doretha Sou    Nocturnal hypoxemia 05/14/2020   OSTEOARTHRITIS 12/26/2006   takes Diclofenac daily but has stopped for surgery   Osteoarthritis 12/26/2006   Qualifier: Diagnosis of  By: Paulina Fusi, RN, Daine Gravel    Paraspinal mass 07/16/2014   Pneumonia 11/11/2017   Pneumonia 01/31/2018   Pneumonia due to COVID-19 virus 03/08/2020   PONV (postoperative nausea and vomiting)    S/P ablation of atrial flutter 07/17/2018   S/P ablation of atrial flutter 07/17/2018   Sensorineural hearing loss (SNHL) of both ears 01/07/2021   Sepsis ([place]) 04/02/2020   Shortness of breath 05/28/2019   Skin cancer    Syncope 08/21/2018   Thoracic aortic aneurysm ([place]) 10/16/2018   Tibialis posterior tendinopathy 04/08/2019   Upper airway cough syndrome 10/27/2017   pfts 10/10/17 s sign airflow obst though the insp loop is somewhat flattened  - Allergy profile 10/26/2017 >  Eos 0.3 /  IgE  6 RAST neg - rx for gerd 10/26/17 > improved 12/07/2017    Vasomotor rhinitis 01/08/2021   VERTIGO 09/07/2009   Has had Vestibular Rehab consultation   Visual impairment 01/07/2021   Wears dentures    Wears glasses    Wears hearing aid    bilateral   Past Surgical History:  Procedure Laterality Date   ABDOMINAL HYSTERECTOMY     ABLATION OF DYSRHYTHMIC FOCUS  2009   APPENDECTOMY     AUGMENTATION MAMMAPLASTY Right 2013   Subpectoral implant   BIOPSY  05/07/2019   Procedure: BIOPSY;  Surgeon: Irving Copas., MD;  Location: [facility];  Service: Gastroenterology;;   BREAST BIOPSY  2013   BREAST RECONSTRUCTION  04/14/2011   Procedure: BREAST RECONSTRUCTION;  Surgeon: Crissie Reese, MD;  Location: [facility];  Service: Plastics;  Laterality: Right;  Placement of Right Breast Tissue Expander with  use of Flex  HD for Breast Reconstruction   BREAST SURGERY     right sm, snbx   CATARACT EXTRACTION W/ INTRAOCULAR LENS  IMPLANT, BILATERAL  2010   bilateral   COLONOSCOPY     COLONOSCOPY     ESOPHAGOGASTRODUODENOSCOPY (EGD) WITH PROPOFOL N/A 05/07/2019   Procedure: ESOPHAGOGASTRODUODENOSCOPY (EGD) WITH PROPOFOL;  Surgeon: Irving Copas., MD;  Location: [facility];  Service: Gastroenterology;  Laterality: N/A;   KNEE SURGERY  2004/2010   arthroscopic/ bilateral knee replacements   LOOP RECORDER IMPLANT     MASTECTOMY Right 2013   PORT-A-CATH REMOVAL N/A 12/07/2016   Procedure: REMOVAL PORT-A-CATH;  Surgeon: Rolm Bookbinder, MD;  Location: [facility];  Service: General;  Laterality: N/A;   PORTACATH PLACEMENT Right 08/11/2014   Procedure: INSERTION PORT-A-CATH WITH ULTRASOUND;  Surgeon: Rolm Bookbinder, MD;  Location: [facility];  Service: General;  Laterality: Right;   [place]   right   tmi  1998   TONSILLECTOMY     TOTAL KNEE ARTHROPLASTY Bilateral  Family History  Problem Relation Age of Onset   Colon cancer Mother    Cancer Mother        colon   Colon cancer Maternal Aunt    Cancer Maternal Uncle    Prostate cancer Maternal Uncle    Prostate cancer Maternal Uncle    Other Maternal Uncle    Cancer Sister        kidney   Cancer Brother        lymph node   Anesthesia problems Neg Hx    Hypotension Neg Hx    Malignant hyperthermia Neg Hx    Pseudochol deficiency Neg Hx    Breast cancer Neg Hx    Social History   Socioeconomic History   Marital status: Widowed    Spouse name: Not on file   Number of children: 2   Years of education: 73   Highest education level: High school graduate  Occupational History   Occupation: retired  Tobacco Use   Smoking status: Former    Packs/day: 0.10    Years: 20.00    Total pack years: 2.00    Types: Cigarettes    Quit date: 01/24/1978    Years since quitting: 43.6    Passive exposure: Past   Smokeless tobacco:  Never  Vaping Use   Vaping Use: Never used  Substance and Sexual Activity   Alcohol use: No   Drug use: No   Sexual activity: Not Currently    Birth control/protection: Surgical  Other Topics Concern   Not on file  Social History Narrative   Mrs Ryker's two children live beside her, son and daughter.   She worked at [employer]   She has a walker, cane, BSC, and lift chair at home.   Social Determinants of Health   Financial Resource Strain: Low Risk  (11/11/2020)   Overall Financial Resource Strain (CARDIA)    Difficulty of Paying Living Expenses: Not hard at all  Food Insecurity: No Food Insecurity (11/11/2020)   Hunger Vital Sign    Worried About Running Out of Food in the Last Year: Never true    Ran Out of Food in the Last Year: Never true  Transportation Needs: No Transportation Needs (11/11/2020)   PRAPARE - Hydrologist (Medical): No    Lack of Transportation (Non-Medical): No  Physical Activity: Insufficiently Active (11/11/2020)   Exercise Vital Sign    Days of Exercise per Week: 7 days    Minutes of Exercise per Session: 10 min  Stress: No Stress Concern Present (11/11/2020)   [place]    Feeling of Stress : Not at all  Social Connections: Moderately Isolated (11/11/2020)   Social Connection and Isolation Panel [NHANES]    Frequency of Communication with Friends and Family: More than three times a week    Frequency of Social Gatherings with Friends and Family: Once a week    Attends Religious Services: More than 4 times per year    Active Member of [employer] or Organizations: No    Attends Archivist Meetings: Never    Marital Status: Widowed    Instrumental Activities of Daily Living Shopping: Self-care House/Yard Work: Dentist of medications: Self-care Finances: Self-care Telephone: Self-care Transportation: Self-care  Mobility Assist  Devices: Cane 4-point, small  Caregivers in home: none in home, but live nearby   [place] in last five office visits:     09/23/2021  1:34 PM 08/19/2021    1:24 PM 06/17/2021    2:09 PM 05/06/2021   10:54 AM 04/07/2021    9:54 AM  Fall Risk   Falls in the past year? 0 0 0 0 0  Number falls in past yr: 0  0 0 0  Injury with Fall? 0  0 0 0  Risk for fall due to : Impaired balance/gait      Follow up Falls evaluation completed        Health Maintenance reviewed: Immunization History  Administered Date(s) Administered   Fluad Quad(high Dose 65+) 09/26/2018   Influenza Split 10/20/2011   Influenza Whole 11/07/2006   Influenza, High Dose Seasonal PF 10/13/2016, 10/12/2017   Influenza-Unspecified 09/07/2013, 10/15/2015, 09/26/2018, 10/01/2019   Moderna Sars-Covid-2 Vaccination 02/04/2019, 03/04/2019, 12/24/2019   Pfizer Covid-19 Vaccine Bivalent Booster 67yr & up 01/07/2021   Pneumococcal Conjugate-13 08/26/2013   Pneumococcal Polysaccharide-23 10/29/2007, 08/28/2017   Td 05/27/2008   Tdap 05/30/2018   Zoster, Live 10/29/2007   Health Maintenance Topics with due status: Overdue     Topic Date Due   Zoster Vaccines- Shingrix Never done   COVID-19 Vaccine 03/04/2021     Vital Signs Weight: 174 lb (78.9 kg) Body mass index is 28.96 kg/m. CrCl cannot be calculated (Patient's most recent lab result is older than the maximum 21 days allowed.). Body surface area is 1.9 meters squared. Vitals:   09/23/21 1332  BP: 122/70  Pulse: 74  SpO2: 98%  Weight: 174 lb (78.9 kg)  Height: '5\' 5"'$  (1.651 m)   Wt Readings from Last 3 Encounters:  09/23/21 174 lb (78.9 kg)  09/14/21 178 lb 3.2 oz (80.8 kg)  09/10/21 177 lb (80.3 kg)   No results found.  Physical Examination:  VS reviewed Physical Exam  Vitals:   09/23/21 1332  BP: 122/70  Pulse: 74  SpO2: 98%    HEENT: left pinne helix with ~8 mm diameter slighly raised border with telangectasia in middle, flesh  colored     Mini-Mental State Examination or Montreal Cognitive Assessment:  Patient did  require additional cues or prompts to complete tasks. Patient was cooperative and attentive to testing tasks Patient did  appear motivated to perform well       11/11/2020    3:12 PM 11/11/2020    1:58 PM 11/05/2019    2:27 PM  6CIT Screen  What Year? 0 points 0 points 0 points  What month? 0 points 0 points 0 points  What time? 0 points 0 points   Count back from 20 0 points 0 points 0 points  Months in reverse 4 points 4 points 4 points  Repeat phrase 8 points 8 points 4 points  Total Score 12 points 12 points         No data to display                 09/23/2021    3:14 PM 01/08/2021   11:00 AM  Montreal Cognitive Assessment   Visuospatial/ Executive (0/5)  3  Naming (0/3)  2  Attention: Read list of digits (0/2) 0 2  Attention: Read list of letters (0/1) 1 1  Attention: Serial 7 subtraction starting at 100 (0/3) 2 0  Language: Repeat phrase (0/2) 0 0  Language : Fluency (0/1) 0 0  Abstraction (0/2) 1 0  Delayed Recall (0/5) 3 3  Orientation (0/6) 6 6  Total  17  Adjusted Score (based on education)  18  Labs No components found for: "VITAMIND"  No results found for: "VITAMINB12"  No results found for: "FOLATE"  Lab Results  Component Value Date   TSH 0.56 08/19/2020    No results found for: "RPR"  No results found for: "HIV"    Chemistry      Component Value Date/Time   NA 140 08/04/2021 1232   NA 146 (H) 01/19/2021 1413   NA 143 12/06/2016 1057   K 4.0 08/04/2021 1232   K 4.2 12/06/2016 1057   CL 105 08/04/2021 1232   CL 107 04/05/2012 1253   CO2 29 08/04/2021 1232   CO2 27 12/06/2016 1057   BUN 17 08/04/2021 1232   BUN 12 01/19/2021 1413   BUN 18.5 12/06/2016 1057   CREATININE 0.72 08/04/2021 1232   CREATININE 0.83 12/13/2019 1041   CREATININE 0.80 10/22/2019 1404   CREATININE 0.8 12/06/2016 1057      Component Value Date/Time    CALCIUM 9.5 08/04/2021 1232   CALCIUM 9.5 12/06/2016 1057   ALKPHOS 100 11/03/2020 1109   ALKPHOS 110 12/06/2016 1057   AST 19 11/03/2020 1109   AST 20 12/13/2019 1041   AST 41 (H) 12/06/2016 1057   ALT 15 11/03/2020 1109   ALT 18 12/13/2019 1041   ALT 57 (H) 12/06/2016 1057   BILITOT 0.7 11/03/2020 1109   BILITOT 0.8 12/13/2019 1041   BILITOT 0.56 12/06/2016 1057       CrCl cannot be calculated (Patient's most recent lab result is older than the maximum 21 days allowed.).   No results found for: "HGBA1C"   '@10RELATIVEDAYS''@No'$  results found. Lab Results  Component Value Date   WBC 10.8 (H) 08/04/2021   HGB 15.0 08/04/2021   HCT 45.7 08/04/2021   MCV 93.2 08/04/2021   PLT 191.0 08/04/2021    No results found for this or any previous visit (from the past 24 hour(s)).  Imaging Head CT: no  Brain MRI: no     Advanced Directives  Advance Directives: In VYNCA Desires life not be prolonged by life-prolonging measures  HC POA; in VYNCA     Assessment and Plan: Please see individual consultation notes from physical therapy, pharmacy and social work for today.    Problem List Items Addressed This Visit       High   Atrial fibrillation, permanent ([place])    Established problem Well Controlled and is at goal of being asymptomatic and rate is controlled . No signs of complications, medication side effects, or red flags. Continue current medications and other regiments.  Referred to [facility]arm[facility] to see if there are options to reduce the cost of Eliquis givne her fixed income status.          Medium    Mild cognitive impairment - Primary    New diagnosis MoCA - Blind score 14 out of 30 which is equal to 19 out of 30 on traditional MoCA Ms Constantino is independent in her iADLs.  A/  Mild Cognitive Impairment P/ Discussed role of social activity in slowing decline of memory.  Ms Spies is interested in attending Adult day centers if she can find affordable transportation to the centers.  Exercise: She is walking about 30 minutes a day up and down her long driveway.  Mediterranean diet: discussed role of fresh fruits, vegetables, whole grains, cold water fish and lean meats.       Relevant Orders   AMB Referral to [facility]   OAB (overactive bladder)  Pelvic prolapse     Unprioritized   Skin lesion of left external ear    New concern Lesion suspicious for basal cell cancer with rolled border and associated telangectasia on superior helix of ear Ms Tuckett plans on contacting her dermatologist, Dr Nevada Crane, for further evaluation and treatment.        Other Visit Diagnoses     Social isolation       Relevant Orders   AMB Referral to [facility] Coordin[facility]      Skin lesion of left external ear New concern Lesion suspicious for basal cell cancer with rolled border and associated telangectasia on superior helix of ear Ms Barich plans on contacting her dermatologist, Dr Nevada Crane, for further evaluation and treatment.    Atrial fibrillation, permanent ([place]) Established problem Well Controlled and is at goal of being asymptomatic and rate is controlled . No signs of complications, medication side effects, or red flags. Continue current medications and other regiments.  Referred to [facility] to see if there are options to reduce the cost of Eliquis givne her fixed income status.    Mild cognitive impairment New diagnosis MoCA - Blind  score 14 out of 30 which is equal to 19 out of 30 on traditional MoCA Ms Garman is independent in her iADLs.  A/  Mild Cognitive Impairment P/ Discussed role of social activity in slowing decline of memory.  Ms Landgrebe is interested in attending Adult day centers if she can find affordable transportation to the centers.  Exercise: She is walking about 30 minutes a day up and down her long driveway.  Mediterranean diet: discussed role of fresh fruits, vegetables, whole grains, cold water fish and lean meats.      Primary Contact: Extended Emergency Contact Information Primary Emergency Contact: Alfred, Eckley Mobile Phone: [phone] Relation: Son Secondary Emergency Contact: Joana Reamer Address: [address]          [place], [place] [address] [place] Mobile Phone: [phone] Relation: Daughter

## 2021-09-24 ENCOUNTER — Encounter: Payer: Self-pay | Admitting: Family Medicine

## 2021-09-24 ENCOUNTER — Telehealth: Payer: Self-pay | Admitting: Family Medicine

## 2021-09-24 NOTE — Telephone Encounter (Signed)
error 

## 2021-09-24 NOTE — Assessment & Plan Note (Signed)
New diagnosis MoCA - Blind score 14 out of 30 which is equal to 19 out of 30 on traditional MoCA Ms Steinbach is independent in her iADLs.  A/  Mild Cognitive Impairment P/ Discussed role of social activity in slowing decline of memory.  Ms Colville is interested in attending Adult day centers if she can find affordable transportation to the centers.  Exercise: She is walking about 30 minutes a day up and down her long driveway.  Mediterranean diet: discussed role of fresh fruits, vegetables, whole grains, cold water fish and lean meats.

## 2021-09-24 NOTE — Telephone Encounter (Signed)
Discussed with Ms Golomb that she already has advanced directives in EMR. Canceled appointment in a month for advanced directive discussion.

## 2021-09-24 NOTE — Assessment & Plan Note (Signed)
Established problem Well Controlled and is at goal of being asymptomatic and rate is controlled . No signs of complications, medication side effects, or red flags. Continue current medications and other regiments.  Referred to [facility] to see if there are options to reduce the cost of Eliquis givne her fixed income status.

## 2021-09-28 ENCOUNTER — Telehealth: Payer: Self-pay

## 2021-09-28 NOTE — Telephone Encounter (Signed)
Spoke to pt regarding possible assistance with [employer] for Eliquis. Pt interested and would like to apply. Pt aware she needs to get a print out of out of pocket expenses that she's spent on medication this year. Once done she will bring to office.  Told pt to call office and ask for me if she has any further questions.

## 2021-09-28 NOTE — Telephone Encounter (Signed)
   Telephone encounter was:  Successful.  09/28/2021 Name: Selena [name] MRN: [id number] DOB: 08/23/1935  Selena [name] is a 86 y.o. year old female who is a primary care patient of McDiarmid, Blane Ohara, MD . The community resource team was consulted for assistance with Transportation Needs  and Senior Day Centers/Activities   Care guide performed the following interventions: Patient provided with information about care guide support team and interviewed to confirm resource needs. Selena [name] did decline services at this time. She advised she has someone to take her to her appointments and that she is skeptical about getting out at this current moment. I will have to close out her referral unfortunately, however, I did provide her with my contact information and advised if she needed anything else she could also contact her PCP or the office. Patient understood. An in basket message was sent to patient's PCP MD McDiarmid and he was informed of patient declining referral.     Follow Up Plan:  No further follow up planned at this time. The patient has been provided with needed resources.  [facility] management  [place], [place]  Main Phone: [phone]  E-mail: [email or web address]  Website: [email or web address]

## 2021-10-13 NOTE — Telephone Encounter (Signed)
Submitted application for ELIQUIS to [employer]) for patient assistance.   Phone: [phone]

## 2021-10-18 NOTE — Telephone Encounter (Signed)
Received notification from [place] ([place]) regarding approval for [employer]. Patient assistance approved from 10/15/21 to 01/23/22.  MEDICATION SHIPS TO PATIENTS HOME  Phone: [phone]

## 2021-10-19 ENCOUNTER — Ambulatory Visit (HOSPITAL_COMMUNITY)
Admission: RE | Admit: 2021-10-19 | Discharge: 2021-10-19 | Disposition: A | Discharge status: Home / Self Care | Payer: Medicare Other | Source: Ambulatory Visit | Attending: Family Medicine | Admitting: Family Medicine

## 2021-10-19 ENCOUNTER — Ambulatory Visit: Payer: Medicare Other | Admitting: Student

## 2021-10-19 VITALS — BP 133/88 | HR 88 | Wt 177.4 lb

## 2021-10-19 DIAGNOSIS — R051 Acute cough: Secondary | ICD-10-CM

## 2021-10-19 DIAGNOSIS — J069 Acute upper respiratory infection, unspecified: Secondary | ICD-10-CM | POA: Diagnosis not present

## 2021-10-19 DIAGNOSIS — J849 Interstitial pulmonary disease, unspecified: Secondary | ICD-10-CM

## 2021-10-19 MED ORDER — PREDNISONE 5 MG PO TABS: 5.0000 mg | ORAL_TABLET | Freq: Every day | ORAL | 0 refills | Status: AC

## 2021-10-19 MED ORDER — ALBUTEROL SULFATE HFA 108 (90 BASE) MCG/ACT IN AERS: 2.0000 | INHALATION_SPRAY | RESPIRATORY_TRACT | 0 refills | Status: DC | PRN

## 2021-10-19 NOTE — Progress Notes (Signed)
    SUBJECTIVE:   CHIEF COMPLAINT / HPI:  86 year old female with history of OSA on CPAP, CHF exacerbation, mild dysesthesia lung disease and non-Hodgkin's lymphoma presents with chronic cough. Patient cough started 5 days ago. Associated symptoms include diffused body ache, congestion, running nose and diarrhea.  These symptoms started on 9/21. She has chronic cough and reports this is different. Cough is productive of white/ yellowish sputum Denies fever, chills or sore throat Non smoker. Report Night sweat x1 two days ago but no weight loss She is followed by pulmonology who at the last visit over a month ago reports mild interstitial changes which recent CT little over a month showed unchanged pulmonary patent.  PERTINENT  PMH / PSH: ILD, OSA on BiPAP, restrictive lung disease, CHF  OBJECTIVE:   BP 133/88   Pulse 88   Wt 177 lb 6.4 oz (80.5 kg)   SpO2 98%   BMI 29.52 kg/m    Physical Exam General: Alert, well appearing, NAD Cardiovascular: RRR, No Murmurs, Normal S2/S2 Respiratory: CTAB, No wheezing or Rales Abdomen: No distension or tenderness Extremities: No edema on extremities   Skin: Warm and dry  ASSESSMENT/PLAN:   Viral illness 86 year old presents with fever, cough, congestion, and rhinorrhea. She is afebrile today and on exam has  good work of breathing on RA and clear breath sounds bilaterally. Overall presentation and exam is consistent with viral URI. However given her history of ILD and severe coughing spells suspect superimposed ILD exacerbation. Given her pulmonary history and increased risk we will give short term steroid treat and ordered checks x-ray to rule out pneumonia or other pulmonary processes.  -Rx 5 mg prednisone for 5 days - Discussed conservative management.  - Recommended tylenol or ibuprofen for fever. - Encouraged adequate hydration for patient.  -Outline signs and symptoms that will warrant ED visit or return for further assessment.     Alen Bleacher, MD [place]

## 2021-10-19 NOTE — Patient Instructions (Signed)
It was wonderful to meet you today. Thank you for allowing me to be a part of your care. Below is a short summary of what we discussed at your visit today:  Your symptoms are likely due to viral illnesses.  And your worsening cough could be exacerbation of your lung disease due to viral illness.  I recommend use of steroid for 5 days.  We will obtain labs to test for COVID, influenza and RSV virus.  I have placed an order for checks x-ray.  If no improvement please call to return on 10/22/2021.  Please bring all of your medications to every appointment!  If you have any questions or concerns, please do not hesitate to contact us via phone or MyChart message.   Alen Bleacher, MD [facility]

## 2021-10-21 LAB — COVID-19, FLU A+B AND RSV
Influenza A, NAA: NOT DETECTED
Influenza B, NAA: NOT DETECTED
RSV, NAA: NOT DETECTED
SARS-CoV-2, NAA: NOT DETECTED

## 2021-10-25 ENCOUNTER — Telehealth: Payer: Self-pay | Admitting: Cardiovascular Disease

## 2021-10-25 ENCOUNTER — Telehealth: Payer: Self-pay

## 2021-10-25 NOTE — Telephone Encounter (Signed)
Patient's DIL called to report patient has been very weak and fatigued today. She has about half of the energy she normally does. She also has a cough due to pulmonary fibrosis. Please advise.

## 2021-10-25 NOTE — Telephone Encounter (Signed)
Daughter in law, Anahis Furgeson called in stating patient became very weak and SOB yesterday while shopping. Per her Leana Roe she does she is normally able to ambulate for shopping with assistance.  She is not sure if patient is in afib, she has a history of afib. Patient denies CP, N/V.   Scheduled her for appointment on 10/27/21 for evaluation. Advised if patient become more SOB, experiences palpitations, chest pain or N/V go to the ED or call EMS. Tracie verbalized understanding.

## 2021-10-25 NOTE — Telephone Encounter (Signed)
Patient calls nurse line in regards to covid test results from 9/26.  Patient (daughter) upset that no one has reached out.   I called the patient and LVM informing of normal results and apologized for delay.

## 2021-10-27 ENCOUNTER — Encounter: Payer: Self-pay | Admitting: Physician Assistant

## 2021-10-27 ENCOUNTER — Ambulatory Visit: Payer: Medicare Other | Attending: Physician Assistant | Admitting: Physician Assistant

## 2021-10-27 VITALS — BP 132/78 | HR 94 | Ht 65.0 in | Wt 173.6 lb

## 2021-10-27 DIAGNOSIS — I10 Essential (primary) hypertension: Secondary | ICD-10-CM

## 2021-10-27 DIAGNOSIS — R0602 Shortness of breath: Secondary | ICD-10-CM

## 2021-10-27 DIAGNOSIS — J209 Acute bronchitis, unspecified: Secondary | ICD-10-CM

## 2021-10-27 DIAGNOSIS — I4821 Permanent atrial fibrillation: Secondary | ICD-10-CM

## 2021-10-27 NOTE — Patient Instructions (Signed)
Medication Instructions:  Your physician recommends that you continue on your current medications as directed. Please refer to the Current Medication list given to you today.  *If you need a refill on your cardiac medications before your next appointment, please call your pharmacy*   Lab Work: TODAY: CBC, BMET, BNP If you have labs (blood work) drawn today and your tests are completely normal, you will receive your results only by: [place] (if you have MyChart) OR A paper copy in the mail If you have any lab test that is abnormal or we need to change your treatment, we will call you to review the results.   Testing/Procedures: NONE   Follow-Up: At [facility], you and your health needs are our priority.  As part of our continuing mission to provide you with exceptional heart care, we have created designated Provider Care Teams.  These Care Teams include your primary Cardiologist (physician) and Advanced Practice Providers (APPs -  Physician Assistants and Nurse Practitioners) who all work together to provide you with the care you need, when you need it.  We recommend signing up for the patient portal called "MyChart".  Sign up information is provided on this After Visit Summary.  MyChart is used to connect with patients for Virtual Visits (Telemedicine).  Patients are able to view lab/test results, encounter notes, upcoming appointments, etc.  Non-urgent messages can be sent to your provider as well.   To learn more about what you can do with MyChart, go to [email or web address].    Your next appointment:   3-4 month(s)  The format for your next appointment:   In Person  Provider:   Lauree Chandler, MD OR APP  Important Information About [place]

## 2021-10-27 NOTE — Progress Notes (Signed)
Cardiology Office Note:    Date:  10/27/2021   ID:  Selena [name], DOB [birth year], MRN [id number]  PCP:  McDiarmid, Blane Ohara, MD  [facility] Cardiologist:  Lauree Chandler, MD  [facility] Electrophysiologist:  None   Chief Complaint: SOB and weakness   History of Present Illness:    Selena [name] is a 86 y.o. female with a hx of persistent atrial fibrillation, OSA on CPAP, HTN, Breast cancer, non-Hodgkin's lymphoma, ILD, and thoracic aortic aneurysm added to my schedule for SOB and weakness.   She had remote atrial flutter in 2008 and had an ablation. She had breast cancer March 2013 treated with right mastectomy and chemotherapy.   She was seen in the ED at [facility] August 2019 with c/o dyspnea for 2 months. D-dimer was elevated. CTA chest negative for PE. There was a 4.2 cm thoracic aortic aneurysm. No evidence of aortic dissection. There was no underlying lung disease. Echo August 2019 with mild LVH, normal LV systolic function with VOHY=07-37%. There was grade 2 diastolic dysfunction. Cardiac monitor February 2020 with PACs, PVCs and SVT. She had a syncopal event in June 2020. She was seen by Dr. Lovena Le and a loop recorder was placed August 2020. She was found to have atrial fibrillation and was started Eliquis. She had ILR turned off due to cost of follow up checks. She had bright red emesis in 2021 and was admitted. EGD showed mild abnormalities in the esophagus and multiple gastric ulcers. She was started on a PPI.   Last echo 02/2020 with LVEF of 55%   Last seen by Dr. Angelena Form 02/2021.  Patient had chronic DOE. 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 >> Recommend to monitor.   Seen by PCP 10/19/21 for viral illness. Started on prednisone. Chest x-ray with multifocal infection.  started on COVID and influenza was negative.   Here today for SOB. Reports she felt better while taking prednisone for 5 days. Since completed course, She has worsening breathing. Reports chronic cough. Now has yellow phlegm. Reports some chest discomfort with cough. Has flipp flopping of heart. No orthopnea or PND. No syncope.   Past Medical History:  Diagnosis Date   Acute gastric ulcer with hemorrhage    Acute on chronic diastolic congestive heart failure ([place]) 10/16/2018   AKI (acute kidney injury) ([place])    Anemia    PMH   Aneurysm (arteriovenous) of coronary vessels    Ankle edema, bilateral 01/08/2021   Atrial fibrillation ([place]) 01/01/2007   BACK PAIN, UPPER 07/09/2008   Breast cancer of upper-outer quadrant of right female breast ([employer]) 03/21/2011   Breast cancer, stage 1 ([place])    Bruises easily    Cataract    history   Complication of anesthesia    COVID-19 virus infection 03/08/2020   Diverticulitis    DIVERTICULOSIS, COLON 12/26/2006   Essential hypertension 10/62/6948   Follicular 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 ([place]) 54/62/7035   Follicular non-Hodgkin's lymphoma ([name])    Frozen shoulder, Left 01/08/2021   Full dentures 01/08/2021   Goiter    multi-nodular   Gustatory rhinitis 01/07/2021   Hemorrhoids 04/08/2021   History of atrioventricular nodal ablation 10/16/2018   Hx of colonoscopy 2009   Incontinence of feces 04/08/2021   Influenza 12/18/2020   Insomnia 09/10/2014   LIVER FUNCTION TESTS, ABNORMAL, HX OF  12/26/2007   Macular degeneration, bilateral 01/08/2021   Memory loss 05/16/2007   Mild cognitive impairment 09/23/2021   Qualifier: Diagnosis of  By: Burnice Logan  MD, Doretha Sou    Nocturnal hypoxemia 05/14/2020   OSTEOARTHRITIS 12/26/2006   takes Diclofenac daily but has stopped for surgery   Osteoarthritis 12/26/2006   Qualifier: Diagnosis of  By: Paulina Fusi, RN, Daine Gravel    Paraspinal mass 07/16/2014   Pneumonia 11/11/2017   Pneumonia  01/31/2018   Pneumonia due to COVID-19 virus 03/08/2020   PONV (postoperative nausea and vomiting)    S/P ablation of atrial flutter 07/17/2018   S/P ablation of atrial flutter 07/17/2018   Sensorineural hearing loss (SNHL) of both ears 01/07/2021   Sepsis ([place]) 04/02/2020   Shortness of breath 05/28/2019   Skin cancer    Syncope 08/21/2018   Thoracic aortic aneurysm ([place]) 10/16/2018   Tibialis posterior tendinopathy 04/08/2019   Upper airway cough syndrome 10/27/2017   pfts 10/10/17 s sign airflow obst though the insp loop is somewhat flattened  - Allergy profile 10/26/2017 >  Eos 0.3 /  IgE  6 RAST neg - rx for gerd 10/26/17 > improved 12/07/2017    Vasomotor rhinitis 01/08/2021   VERTIGO 09/07/2009   Has had Vestibular Rehab consultation   Visual impairment 01/07/2021   Wears dentures    Wears glasses    Wears hearing aid    bilateral    Past Surgical History:  Procedure Laterality Date   ABDOMINAL HYSTERECTOMY     ABLATION OF DYSRHYTHMIC FOCUS  2009   APPENDECTOMY     AUGMENTATION MAMMAPLASTY Right 2013   Subpectoral implant   BIOPSY  05/07/2019   Procedure: BIOPSY;  Surgeon: Irving Copas., MD;  Location: [facility];  Service: Gastroenterology;;   BREAST BIOPSY  2013   BREAST RECONSTRUCTION  04/14/2011   Procedure: BREAST RECONSTRUCTION;  Surgeon: Crissie Reese, MD;  Location: [facility];  Service: Plastics;  Laterality: Right;  Placement of Right Breast Tissue Expander with  use of Flex HD for Breast Reconstruction   BREAST SURGERY     right sm, snbx   CATARACT EXTRACTION W/ INTRAOCULAR LENS  IMPLANT, BILATERAL  2010   bilateral   COLONOSCOPY     COLONOSCOPY     ESOPHAGOGASTRODUODENOSCOPY (EGD) WITH PROPOFOL N/A 05/07/2019   Procedure: ESOPHAGOGASTRODUODENOSCOPY (EGD) WITH PROPOFOL;  Surgeon: Irving Copas., MD;  Location: [facility];  Service: Gastroenterology;  Laterality: N/A;   KNEE SURGERY  2004/2010   arthroscopic/ bilateral knee replacements    LOOP RECORDER IMPLANT     MASTECTOMY Right 2013   PORT-A-CATH REMOVAL N/A 12/07/2016   Procedure: REMOVAL PORT-A-CATH;  Surgeon: Rolm Bookbinder, MD;  Location: [facility];  Service: General;  Laterality: N/A;   PORTACATH PLACEMENT Right 08/11/2014   Procedure: INSERTION PORT-A-CATH WITH ULTRASOUND;  Surgeon: Rolm Bookbinder, MD;  Location: [place];  Service: General;  Laterality: Right;   ROTATOR CUFF REPAIR  1999   right   tmi  1998   TONSILLECTOMY     TOTAL KNEE ARTHROPLASTY Bilateral     Current Medications: Current Meds  Medication Sig   albuterol (VENTOLIN HFA) 108 (90 Base) MCG/ACT inhaler Inhale 2 puffs into the lungs every 4 (four) hours as needed for wheezing or shortness of breath.   amLODipine (NORVASC) 5 MG tablet Take 1 tablet (5 mg total) by mouth daily.   apixaban (ELIQUIS) 5 MG TABS tablet TAKE 1 TABLET(5 MG) BY MOUTH TWICE DAILY   Azelastine-Fluticasone 137-50 MCG/ACT SUSP  Place 1 puff into the nose daily.   chlorpheniramine (CHLOR-TRIMETON) 4 MG tablet Take 1 tablet (4 mg total) by mouth 2 (two) times daily as needed for allergies.   famotidine (PEPCID) 20 MG tablet Take 20 mg by mouth 2 (two) times daily.   fluticasone (FLOVENT HFA) 110 MCG/ACT inhaler Inhale 2 puffs into the lungs in the morning and at bedtime.   guaiFENesin-codeine 100-10 MG/5ML syrup TAKE 2.5 TO 5 ML BY MOUTH THREE TIMES DAILY AS NEEDED FOR COUGH   metoprolol tartrate (LOPRESSOR) 25 MG tablet Take 1 tablet (25 mg total) by mouth 2 (two) times daily.   montelukast (SINGULAIR) 10 MG tablet TAKE 1 TABLET(10 MG) BY MOUTH AT BEDTIME   NON FORMULARY CBD oil   pantoprazole (PROTONIX) 40 MG tablet TAKE 1 TABLET(40 MG) BY MOUTH TWICE DAILY   traZODone (DESYREL) 50 MG tablet Take 0.5-1 tablets (25-50 mg total) by mouth at bedtime as needed for sleep.     Allergies:   Codeine phosphate, Adhesive [tape], and Codeine   Social History   Socioeconomic History   Marital status: Widowed    Spouse name: Not on  file   Number of children: 2   Years of education: 26   Highest education level: High school graduate  Occupational History   Occupation: retired  Tobacco Use   Smoking status: Former    Packs/day: 0.10    Years: 20.00    Total pack years: 2.00    Types: Cigarettes    Quit date: 01/24/1978    Years since quitting: 43.7    Passive exposure: Past   Smokeless tobacco: Never  Vaping Use   Vaping Use: Never used  Substance and Sexual Activity   Alcohol use: No   Drug use: No   Sexual activity: Not Currently    Birth control/protection: Surgical  Other Topics Concern   Not on file  Social History Narrative   Selena [name]'s two children live beside her, son and daughter.   She worked at [employer]   She has a walker, cane, BSC, and lift chair at home.   Social Determinants of Health   Financial Resource Strain: Low Risk  (11/11/2020)   Overall Financial Resource Strain (CARDIA)    Difficulty of Paying Living Expenses: Not hard at all  Food Insecurity: No Food Insecurity (11/11/2020)   Hunger Vital Sign    Worried About Running Out of Food in the Last Year: Never true    Ran Out of Food in the Last Year: Never true  Transportation Needs: No Transportation Needs (11/11/2020)   PRAPARE - Hydrologist (Medical): No    Lack of Transportation (Non-Medical): No  Physical Activity: Insufficiently Active (11/11/2020)   Exercise Vital Sign    Days of Exercise per Week: 7 days    Minutes of Exercise per Session: 10 min  Stress: No Stress Concern Present (11/11/2020)   [place]    Feeling of Stress : Not at all  Social Connections: Moderately Isolated (11/11/2020)   Social Connection and Isolation Panel [NHANES]    Frequency of Communication with Friends and Family: More than three times a week    Frequency of Social Gatherings with Friends and Family: Once a week    Attends Religious  Services: More than 4 times per year    Active Member of [employer] or Organizations: No    Attends Archivist Meetings: Never    Marital  Status: Widowed     Family History: The patient's family history includes Cancer in her brother, maternal uncle, mother, and sister; Colon cancer in her maternal aunt and mother; Other in her maternal uncle; Prostate cancer in her maternal uncle and maternal uncle. There is no history of Anesthesia problems, Hypotension, Malignant hyperthermia, Pseudochol deficiency, or Breast cancer.    ROS:   Please see the history of present illness.    All other systems reviewed and are negative.   EKGs/Labs/Other Studies Reviewed:    The following studies were reviewed today:  Echo 02/2020  1. Left ventricular ejection fraction, by estimation, is 55%. The left  ventricle has normal function. The left ventricle has no regional wall  motion abnormalities. There is mild left ventricular hypertrophy. Left  ventricular diastolic parameters are  indeterminate.   2. Right ventricular systolic function is normal. The right ventricular  size is normal. Tricuspid regurgitation signal is inadequate for assessing  PA pressure.   3. Left atrial size was moderately dilated.   4. Right atrial size was moderately dilated.   5. The mitral valve is normal in structure. Trivial mitral valve  regurgitation. No evidence of mitral stenosis.   6. The aortic valve is tricuspid. Aortic valve regurgitation is not  visualized. Mild aortic valve sclerosis is present, with no evidence of  aortic valve stenosis.   7. The inferior vena cava is normal in size with greater than 50%  respiratory variability, suggesting right atrial pressure of 3 mmHg.   8. The patient was in atrial fibrillation.   Monitor 07/2018 Sinus rhythm Sinus bradycardia with lowest HR 42 bpm with 1st degree AV block Premature atrial contractions with runs of atrial tachycardia Several short runs of  non-sustained VT   Pt has appt to see EP next week.   EKG:  EKG is not  ordered today.    Recent Labs: 11/03/2020: ALT 15 01/19/2021: BNP 155.8 08/04/2021: BUN 17; Creatinine, Ser 0.72; Hemoglobin 15.0; Platelets 191.0; Potassium 4.0; Sodium 140  Recent Lipid Panel    Component Value Date/Time   CHOL 154 02/19/2020 1526   TRIG 93 03/08/2020 1141   HDL 43.60 02/19/2020 1526   CHOLHDL 4 02/19/2020 1526   VLDL 41.2 (H) 02/19/2020 1526   LDLCALC 105 (H) 08/28/2017 1410   LDLDIRECT 98.0 02/19/2020 1526   Physical Exam:    VS:  BP 132/78   Pulse 94   Ht '5\' 5"'$  (1.651 m)   Wt 173 lb 9.6 oz (78.7 kg)   SpO2 97%   BMI 28.89 kg/m     Wt Readings from Last 3 Encounters:  10/27/21 173 lb 9.6 oz (78.7 kg)  10/19/21 177 lb 6.4 oz (80.5 kg)  09/23/21 174 lb (78.9 kg)     GEN:  Well nourished, well developed in no acute distress HEENT: Normal NECK: No JVD; No carotid bruits LYMPHATICS: No lymphadenopathy CARDIAC: IR IR  no murmurs, rubs, gallops RESPIRATORY:  basilar rhonchi ABDOMEN: Soft, non-tender, non-distended MUSCULOSKELETAL:  No edema; No deformity  SKIN: Warm and dry NEUROLOGIC:  Alert and oriented x 3 PSYCHIATRIC:  Normal affect   ASSESSMENT AND PLAN:    SOB Felt better while on prednisone but symptoms worsen since completed course.  Chest x-ray "Increased reticulonodular opacities compatible with multifocal infection". She was not given any medications. ? Address by provider.  I think she has underlying lung exacerbation leading to worsening SOB and cough with possible costochondritis.  Reported some fluttering sensation but does not seems  primary issue as she as persistent afib. It is possible that she may has intermittent elevated heart rate with underlying lung issue. Will check blood work. Recommended to follow up with PCP or pulmonologist with Chest x-ray review. May need repeat X-ray.  Try OTC mucinex.    2. Persistent afib Rate controlled in  clinic. Continue Eliquis and lopressor.   Medication Adjustments/Labs and Tests Ordered: Current medicines are reviewed at length with the patient today.  Concerns regarding medicines are outlined above.  Orders Placed This Encounter  Procedures   Basic metabolic panel   CBC   Pro b natriuretic peptide (BNP)   No orders of the defined types were placed in this encounter.   Patient Instructions  Medication Instructions:  Your physician recommends that you continue on your current medications as directed. Please refer to the Current Medication list given to you today.  *If you need a refill on your cardiac medications before your next appointment, please call your pharmacy*   Lab Work: TODAY: CBC, BMET, BNP If you have labs (blood work) drawn today and your tests are completely normal, you will receive your results only by: [place] (if you have MyChart) OR A paper copy in the mail If you have any lab test that is abnormal or we need to change your treatment, we will call you to review the results.   Testing/Procedures: NONE   Follow-Up: At [facility], you and your health needs are our priority.  As part of our continuing mission to provide you with exceptional heart care, we have created designated Provider Care Teams.  These Care Teams include your primary Cardiologist (physician) and Advanced Practice Providers (APPs -  Physician Assistants and Nurse Practitioners) who all work together to provide you with the care you need, when you need it.  We recommend signing up for the patient portal called "MyChart".  Sign up information is provided on this After Visit Summary.  MyChart is used to connect with patients for Virtual Visits (Telemedicine).  Patients are able to view lab/test results, encounter notes, upcoming appointments, etc.  Non-urgent messages can be sent to your provider as well.   To learn more about what you can do with MyChart, go to  [email or web address].    Your next appointment:   3-4 month(s)  The format for your next appointment:   In Person  Provider:   Lauree Chandler, MD OR APP  Important Information About [place]         Jarrett Soho, PA  10/27/2021 3:20 PM    [place]

## 2021-10-28 ENCOUNTER — Ambulatory Visit: Payer: Medicare Other | Admitting: Student

## 2021-10-28 VITALS — BP 122/65 | HR 77 | Temp 98.2°F | Ht 65.0 in | Wt 174.2 lb

## 2021-10-28 DIAGNOSIS — J189 Pneumonia, unspecified organism: Secondary | ICD-10-CM | POA: Diagnosis not present

## 2021-10-28 DIAGNOSIS — I10 Essential (primary) hypertension: Secondary | ICD-10-CM

## 2021-10-28 DIAGNOSIS — R7989 Other specified abnormal findings of blood chemistry: Secondary | ICD-10-CM

## 2021-10-28 DIAGNOSIS — R058 Other specified cough: Secondary | ICD-10-CM | POA: Insufficient documentation

## 2021-10-28 LAB — CBC
Hematocrit: 44.4 % (ref 34.0–46.6)
Hemoglobin: 14.8 g/dL (ref 11.1–15.9)
MCH: 30.8 pg (ref 26.6–33.0)
MCHC: 33.3 g/dL (ref 31.5–35.7)
MCV: 93 fL (ref 79–97)
Platelets: 213 10*3/uL (ref 150–450)
RBC: 4.8 x10E6/uL (ref 3.77–5.28)
RDW: 12.8 % (ref 11.7–15.4)
WBC: 7.5 10*3/uL (ref 3.4–10.8)

## 2021-10-28 LAB — BASIC METABOLIC PANEL
BUN/Creatinine Ratio: 18 (ref 12–28)
BUN: 12 mg/dL (ref 8–27)
CO2: 23 mmol/L (ref 20–29)
Calcium: 9.3 mg/dL (ref 8.7–10.3)
Chloride: 103 mmol/L (ref 96–106)
Creatinine, Ser: 0.66 mg/dL (ref 0.57–1.00)
Glucose: 91 mg/dL (ref 70–99)
Potassium: 4.1 mmol/L (ref 3.5–5.2)
Sodium: 143 mmol/L (ref 134–144)
eGFR: 86 mL/min/{1.73_m2} (ref >59)

## 2021-10-28 LAB — PRO B NATRIURETIC PEPTIDE: NT-Pro BNP: 1278 pg/mL — ABNORMAL HIGH (ref 0–738)

## 2021-10-28 MED ORDER — CEFDINIR 300 MG PO CAPS: 300.0000 mg | ORAL_CAPSULE | Freq: Two times a day (BID) | ORAL | 0 refills | Status: AC

## 2021-10-28 MED ORDER — DOXYCYCLINE HYCLATE 100 MG PO TABS: 100.0000 mg | ORAL_TABLET | Freq: Every day | ORAL | 0 refills | Status: AC

## 2021-10-28 NOTE — Patient Instructions (Addendum)
It was great seeing you today.  You have pneumonia.  We are going to treat this with 2 different antibiotics. Please take cefdinir and doxycycline as prescribed for 7 days. You may continue taking Mucinex.  Your cardiologist sent in a medication called Lasix for you.  Please take this as prescribed.  This helps to reduce fluid in the lungs. We also going to get an echocardiogram (ultrasound of your heart) due to an elevated BNP level to make sure that your heart function is normal.  You will receive a call to get this scheduled.  If you do not improve in 1 week, please give [place] a call.   Be well,  Dr. Orvis Brill [facility] [phone]

## 2021-10-28 NOTE — Assessment & Plan Note (Addendum)
Patient presented with ongoing dyspnea, productive cough, and now night sweats and chills for the past 2 days. She is generally well-appearing, nontoxic, no hypoxia on room air, able to speak in full sentences.  Lung exam with crackles, rhonchi. CXR from 9/26 with concern for multifocal infection. Initially treated as a likely viral respiratory illness, did not resolve with 5-day course of prednisone. At this point, with worsening symptoms, we will treat with cefdinir and doxycycline x7 days for possible bacterial pneumonia. Consider CHF exacerbation as well, given elevated BNP in the thousands at cardiologist visit yesterday.  Although no peripheral edema.  Also consider ILD flare. We will obtain echocardiogram as this was last completed a year ago, with LVEF 49%, no diastolic dysfunction. Return precautions discussed.  Patient and daughter-in-law both understand.

## 2021-10-28 NOTE — Assessment & Plan Note (Signed)
Blood pressure well controlled at visit today.

## 2021-10-28 NOTE — Progress Notes (Signed)
ini   SUBJECTIVE:   CHIEF COMPLAINT / HPI:   Selena [name] is an 86 year old female with a history of persistent atrial fibrillation on Eliquis, OSA on CPAP, ILD, hypertension here for cough and sputum production.  She was seen for cough and dyspnea on 10/19/2021 here in the [facility] clinic with a chest x-ray showing multifocal infection with increased reticular nodular opacities throughout the lungs increased from prior.  COVID and flu were negative.  She was diagnosed with a viral illness. She completed a 5-day prednisone course.  She saw her cardiologist yesterday for shortness of breath and and fluttering sensation (although she has persistent A-fib).  They obtained blood work which showed a BNP of 1278, CBC and BMP within normal limits.  They recommended her to follow-up with [place] or her pulmonologist.  They recommended that she try Mucinex.  Today, she is having dyspnea on exertion with talking and walking for the past 9 days. Also has a new different cough that is productive (yellow and white), night sweats. No sick contacts. Has been using albuterol BID, but no improvement.  Of note: She has chronic dyspnea on exertion.  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  They are monitoring.  PERTINENT  PMH / PSH:   OBJECTIVE:   BP 122/65   Pulse 77   Temp 98.2 F (36.8 C)   Ht '5\' 5"'$  (1.651 m)   Wt 174 lb 3.2 oz (79 kg)   SpO2 98%   BMI 28.99 kg/m   General: Elderly female in no acute distress, intermittently coughs, non-toxic appearing CV: Regular rate and rhythm, not in A-fib Respiratory: Able to speak in full sentences.  Crackles at bases, rhonchorous breath sounds.  Decent air movement.  No tachypnea.  Productive cough. Neuro: Awake, alert. Speech clear and fluent. Hard of hearing. Ext: No edema  ASSESSMENT/PLAN:   Essential hypertension Blood pressure well controlled at  visit today.  Pneumonia Patient presented with ongoing dyspnea, productive cough, and now night sweats and chills for the past 2 days. She is generally well-appearing, nontoxic, no hypoxia on room air, able to speak in full sentences.  Lung exam with crackles, rhonchi. CXR from 9/26 with concern for multifocal infection. Initially treated as a likely viral respiratory illness, did not resolve with 5-day course of prednisone. At this point, with worsening symptoms, we will treat with cefdinir and doxycycline x7 days for possible bacterial pneumonia. Consider CHF exacerbation as well, given elevated BNP in the thousands at cardiologist visit yesterday.  Although no peripheral edema.  Also consider ILD flare. We will obtain echocardiogram as this was last completed a year ago, with LVEF 98%, no diastolic dysfunction. Return precautions discussed.  Patient and daughter-in-law both understand.     Orvis Brill, [place]

## 2021-10-29 ENCOUNTER — Telehealth: Payer: Self-pay | Admitting: Cardiovascular Disease

## 2021-10-29 ENCOUNTER — Telehealth: Payer: Self-pay | Admitting: Nurse Practitioner

## 2021-10-29 ENCOUNTER — Telehealth: Payer: Self-pay | Admitting: Physician Assistant

## 2021-10-29 NOTE — Telephone Encounter (Signed)
Pt daughter Selena [name] stated that her cardiologist notified her to start taking her lasix to get some fluid off from around her heart: Selena [name] stated that our office took her off the lasix a while back: Selena [name] was notified to follow the Cardiologist orders and if she needs a prescription to contact the Cardiologist:  Selena [name] verbalized understanding with all questions answered.

## 2021-10-29 NOTE — Telephone Encounter (Signed)
Inbound call from patients daughter confused about her mother taking lasix or not. Please advise.

## 2021-10-29 NOTE — Telephone Encounter (Signed)
Patient called to follow-up on getting her Lasix medication.  Patient stated she spoke with her stomach doctor and he said it was OK for her to be on this medication.  Patient stated she does not have any of this medication and the prescription would need to be sent to [facility], [place] - [address] AT [address].

## 2021-10-29 NOTE — Telephone Encounter (Signed)
New Message:     Patient's daughter called. She said patient was seen here on 10-27-21 and was told that Lasix would be called in for her. She said it is still not at the pharmacy.   Pt c/o medication issue:  1. Name of Medication: Furosemide  2. How are you currently taking this medication (dosage and times per day)?   3. Are you having a reaction (difficulty breathing--STAT)?   4. What is your medication issue? Medicine was supposed to have been called in on 10--4-23

## 2021-10-29 NOTE — Telephone Encounter (Signed)
*  STAT* If patient is at the pharmacy, call can be transferred to refill team.   1. Which medications need to be refilled? (please list name of each medication and dose if known)  Lasix  2. Which pharmacy/location (including street and city if local pharmacy) is medication to be sent to? [facility], [place] - [address] AT [address]  3. Do they need a 30 day or 90 day supply?  30 day supply

## 2021-10-29 NOTE — Telephone Encounter (Signed)
Called pt to inform her that this medication was D/C and is no longer on her medication list and if she has any other problems, questions or concerns, to give our office a call back. Pt verbalized understanding.

## 2021-11-01 ENCOUNTER — Encounter: Payer: Self-pay | Admitting: Family Medicine

## 2021-11-01 MED ORDER — FUROSEMIDE 20 MG PO TABS: 20.0000 mg | ORAL_TABLET | Freq: Every day | ORAL | 0 refills | Status: DC

## 2021-11-01 NOTE — Telephone Encounter (Signed)
Spoke with the patient's daughter and advised that the patient should only take Lasix 20 mg for 5 days. She verbalized understanding.

## 2021-11-05 ENCOUNTER — Other Ambulatory Visit: Payer: Self-pay | Admitting: Student

## 2021-11-05 DIAGNOSIS — J849 Interstitial pulmonary disease, unspecified: Secondary | ICD-10-CM

## 2021-11-05 NOTE — Telephone Encounter (Signed)
Needs office visit if frequent use albuterol meter dosed inhaler persists.

## 2021-11-08 ENCOUNTER — Ambulatory Visit (HOSPITAL_COMMUNITY)
Admission: RE | Admit: 2021-11-08 | Discharge: 2021-11-08 | Disposition: A | Discharge status: Home / Self Care | Payer: Medicare Other | Source: Ambulatory Visit | Attending: Family Medicine | Admitting: Family Medicine

## 2021-11-08 DIAGNOSIS — R7989 Other specified abnormal findings of blood chemistry: Secondary | ICD-10-CM | POA: Diagnosis not present

## 2021-11-08 DIAGNOSIS — I4891 Unspecified atrial fibrillation: Secondary | ICD-10-CM | POA: Insufficient documentation

## 2021-11-08 DIAGNOSIS — R0609 Other forms of dyspnea: Secondary | ICD-10-CM | POA: Insufficient documentation

## 2021-11-08 DIAGNOSIS — I10 Essential (primary) hypertension: Secondary | ICD-10-CM | POA: Diagnosis not present

## 2021-11-08 LAB — ECHOCARDIOGRAM COMPLETE
Area-P 1/2: 3.65 cm2
S' Lateral: 2.9 cm

## 2021-11-11 ENCOUNTER — Ambulatory Visit: Payer: Medicare Other | Admitting: Family Medicine

## 2021-11-15 ENCOUNTER — Encounter: Payer: Self-pay | Admitting: *Deleted

## 2021-11-15 ENCOUNTER — Other Ambulatory Visit: Payer: Self-pay | Admitting: Nurse Practitioner

## 2021-11-24 ENCOUNTER — Ambulatory Visit (INDEPENDENT_AMBULATORY_CARE_PROVIDER_SITE_OTHER): Payer: Medicare Other

## 2021-11-24 VITALS — Ht 65.0 in | Wt 174.0 lb

## 2021-11-24 DIAGNOSIS — Z0000 Encounter for general adult medical examination without abnormal findings: Secondary | ICD-10-CM

## 2021-11-24 NOTE — Patient Instructions (Addendum)
Ms. Cregan , Thank you for taking time to come for your Medicare Wellness Visit. I appreciate your ongoing commitment to your health goals. Please review the following plan we discussed and let me know if I can assist you in the future.   These are the goals we discussed:  Goals       Patient Stated (pt-stated)      Stay healthy      Patient Stated      Get nose to stop running       Patient Stated      Stop having a runny nose        This is a list of the screening recommended for you and due dates:  Health Maintenance  Topic Date Due   COVID-19 Vaccine (5 - Moderna risk series) 03/04/2021   Zoster (Shingles) Vaccine (1 of 2) 02/24/2022*   Medicare Annual Wellness Visit  11/25/2022   Tetanus Vaccine  05/29/2028   Pneumonia Vaccine  Completed   DEXA scan (bone density measurement)  Completed   HPV Vaccine  Aged Out   Colon Cancer Screening  Discontinued  *Topic was postponed. The date shown is not the original due date.    Advanced directives: In chart  Conditions/risks identified: None  Next appointment: Follow up in one year for your annual wellness visit.   Preventive Care 16 Years and Older, Female  Preventive care refers to lifestyle choices and visits with your health care provider that can promote health and wellness. What does preventive care include? A yearly physical exam. This is also called an annual well check. Dental exams once or twice a year. Routine eye exams. Ask your health care provider how often you should have your eyes checked. Personal lifestyle choices, including: Daily care of your teeth and gums. Regular physical activity. Eating a healthy diet. Avoiding tobacco and drug use. Limiting alcohol use. Practicing safe sex. Taking low doses of aspirin every day. Taking vitamin and mineral supplements as recommended by your health care provider. What happens during an annual well check? The services and screenings done by your health care  provider during your annual well check will depend on your age, overall health, lifestyle risk factors, and family history of disease. Counseling  Your health care provider may ask you questions about your: Alcohol use. Tobacco use. Drug use. Emotional well-being. Home and relationship well-being. Sexual activity. Eating habits. History of falls. Memory and ability to understand (cognition). Work and work Statistician. Screening  You may have the following tests or measurements: Height, weight, and BMI. Blood pressure. Lipid and cholesterol levels. These may be checked every 5 years, or more frequently if you are over 79 years old. Skin check. Lung cancer screening. You may have this screening every year starting at age 81 if you have a 30-pack-year history of smoking and currently smoke or have quit within the past 15 years. Fecal occult blood test (FOBT) of the stool. You may have this test every year starting at age 21. Flexible sigmoidoscopy or colonoscopy. You may have a sigmoidoscopy every 5 years or a colonoscopy every 10 years starting at age 34. Prostate cancer screening. Recommendations will vary depending on your family history and other risks. Hepatitis C blood test. Hepatitis B blood test. Sexually transmitted disease (STD) testing. Diabetes screening. This is done by checking your blood sugar (glucose) after you have not eaten for a while (fasting). You may have this done every 1-3 years. Abdominal aortic aneurysm (AAA) screening. You  may need this if you are a current or former smoker. Osteoporosis. You may be screened starting at age 22 if you are at high risk. Talk with your health care provider about your test results, treatment options, and if necessary, the need for more tests. Vaccines  Your health care provider may recommend certain vaccines, such as: Influenza vaccine. This is recommended every year. Tetanus, diphtheria, and acellular pertussis (Tdap, Td)  vaccine. You may need a Td booster every 10 years. Zoster vaccine. You may need this after age 14. Pneumococcal 13-valent conjugate (PCV13) vaccine. One dose is recommended after age 62. Pneumococcal polysaccharide (PPSV23) vaccine. One dose is recommended after age 11. Talk to your health care provider about which screenings and vaccines you need and how often you need them. This information is not intended to replace advice given to you by your health care provider. Make sure you discuss any questions you have with your health care provider. Document Released: 02/06/2015 Document Revised: 09/30/2015 Document Reviewed: 11/11/2014 Elsevier Interactive Patient Education  2017 [place] Prevention in the Home Falls can cause injuries. They can happen to people of all ages. There are many things you can do to make your home safe and to help prevent falls. What can I do on the outside of my home? Regularly fix the edges of walkways and driveways and fix any cracks. Remove anything that might make you trip as you walk through a door, such as a raised step or threshold. Trim any bushes or trees on the path to your home. Use bright outdoor lighting. Clear any walking paths of anything that might make someone trip, such as rocks or tools. Regularly check to see if handrails are loose or broken. Make sure that both sides of any steps have handrails. Any raised decks and porches should have guardrails on the edges. Have any leaves, snow, or ice cleared regularly. Use sand or salt on walking paths during winter. Clean up any spills in your garage right away. This includes oil or grease spills. What can I do in the bathroom? Use night lights. Install grab bars by the toilet and in the tub and shower. Do not use towel bars as grab bars. Use non-skid mats or decals in the tub or shower. If you need to sit down in the shower, use a plastic, non-slip stool. Keep the floor dry. Clean up any  water that spills on the floor as soon as it happens. Remove soap buildup in the tub or shower regularly. Attach bath mats securely with double-sided non-slip rug tape. Do not have throw rugs and other things on the floor that can make you trip. What can I do in the bedroom? Use night lights. Make sure that you have a light by your bed that is easy to reach. Do not use any sheets or blankets that are too big for your bed. They should not hang down onto the floor. Have a firm chair that has side arms. You can use this for support while you get dressed. Do not have throw rugs and other things on the floor that can make you trip. What can I do in the kitchen? Clean up any spills right away. Avoid walking on wet floors. Keep items that you use a lot in easy-to-reach places. If you need to reach something above you, use a strong step stool that has a grab bar. Keep electrical cords out of the way. Do not use floor polish or wax that  makes floors slippery. If you must use wax, use non-skid floor wax. Do not have throw rugs and other things on the floor that can make you trip. What can I do with my stairs? Do not leave any items on the stairs. Make sure that there are handrails on both sides of the stairs and use them. Fix handrails that are broken or loose. Make sure that handrails are as long as the stairways. Check any carpeting to make sure that it is firmly attached to the stairs. Fix any carpet that is loose or worn. Avoid having throw rugs at the top or bottom of the stairs. If you do have throw rugs, attach them to the floor with carpet tape. Make sure that you have a light switch at the top of the stairs and the bottom of the stairs. If you do not have them, ask someone to add them for you. What else can I do to help prevent falls? Wear shoes that: Do not have high heels. Have rubber bottoms. Are comfortable and fit you well. Are closed at the toe. Do not wear sandals. If you use a  stepladder: Make sure that it is fully opened. Do not climb a closed stepladder. Make sure that both sides of the stepladder are locked into place. Ask someone to hold it for you, if possible. Clearly mark and make sure that you can see: Any grab bars or handrails. First and last steps. Where the edge of each step is. Use tools that help you move around (mobility aids) if they are needed. These include: Canes. Walkers. Scooters. Crutches. Turn on the lights when you go into a dark area. Replace any light bulbs as soon as they burn out. Set up your furniture so you have a clear path. Avoid moving your furniture around. If any of your floors are uneven, fix them. If there are any pets around you, be aware of where they are. Review your medicines with your doctor. Some medicines can make you feel dizzy. This can increase your chance of falling. Ask your doctor what other things that you can do to help prevent falls. This information is not intended to replace advice given to you by your health care provider. Make sure you discuss any questions you have with your health care provider. Document Released: 11/06/2008 Document Revised: 06/18/2015 Document Reviewed: 02/14/2014 Elsevier Interactive Patient Education  2017 [employer].

## 2021-11-24 NOTE — Progress Notes (Signed)
Subjective:   Selena [name] is a 86 y.o. female who presents for Medicare Annual (Subsequent) preventive examination.  Review of Systems    Cardiac Risk Factors include: advanced age (>64mn, >>54women);female gender;hypertensionVirtual Visit via Telephone Note  I connected with  RIrena [name] 11/24/21 at  2:00 PM EDT by telephone and verified that I am speaking with the correct person using two identifiers.  Location: Patient: Home Provider:Office Persons participating in the virtual visit: patient/Nurse Health Advisor   I discussed the limitations, risks, security and privacy concerns of performing an evaluation and management service by telephone and the availability of in person appointments. The patient expressed understanding and agreed to proceed.  Interactive audio and video telecommunications were attempted between this nurse and patient, however failed, due to patient having technical difficulties OR patient did not have access to video capability.  We continued and completed visit with audio only.  Some vital signs may be absent or patient reported.   Selena Peaches LPN      Objective:    Today's Vitals   11/24/21 1411  Weight: 174 lb (78.9 kg)  Height: '5\' 5"'$  (1.651 m)   Body mass index is 28.96 kg/m.     11/24/2021    2:22 PM 10/19/2021    1:38 PM 09/23/2021    1:34 PM 08/19/2021    1:25 PM 06/17/2021    2:09 PM 05/06/2021   10:57 AM 04/15/2021   11:09 AM  Advanced Directives  Does Patient Have a Medical Advance Directive? Yes Yes Yes Yes No No No  Type of [name]     Does patient want to make changes to medical advance directive? No - Patient declined        Copy of [place] Chart? Yes - validated most recent copy scanned in chart (See row information) Yes - validated most  recent copy scanned in chart (See row information) Yes - validated most recent copy scanned in chart (See row information) Yes - validated most recent copy scanned in chart (See row information)     Would patient like information on creating a medical advance directive?    No - Patient declined No - Patient declined No - Patient declined No - Patient declined    Current Medications (verified) Outpatient Encounter Medications as of 11/24/2021  Medication Sig   albuterol (VENTOLIN HFA) 108 (90 Base) MCG/ACT inhaler INHALE 2 PUFFS INTO THE LUNGS EVERY 4 HOURS AS NEEDED FOR WHEEZING OR SHORTNESS OF BREATH   amLODipine (NORVASC) 5 MG tablet Take 1 tablet (5 mg total) by mouth daily.   apixaban (ELIQUIS) 5 MG TABS tablet TAKE 1 TABLET(5 MG) BY MOUTH TWICE DAILY   Azelastine-Fluticasone 137-50 MCG/ACT SUSP Place 1 puff into the nose daily.   chlorpheniramine (CHLOR-TRIMETON) 4 MG tablet Take 1 tablet (4 mg total) by mouth 2 (two) times daily as needed for allergies.   famotidine (PEPCID) 20 MG tablet TAKE 1 TABLET(20 MG) BY MOUTH AT BEDTIME   fluticasone (FLOVENT HFA) 110 MCG/ACT inhaler Inhale 2 puffs into the lungs in the morning and at bedtime.   furosemide (LASIX) 20 MG tablet Take 1 tablet (20 mg total) by mouth daily.   guaiFENesin-codeine 100-10 MG/5ML syrup TAKE 2.5 TO 5 ML BY MOUTH THREE TIMES DAILY AS NEEDED FOR COUGH   metoprolol tartrate (LOPRESSOR) 25 MG tablet Take  1 tablet (25 mg total) by mouth 2 (two) times daily.   montelukast (SINGULAIR) 10 MG tablet TAKE 1 TABLET(10 MG) BY MOUTH AT BEDTIME   NON FORMULARY CBD oil   pantoprazole (PROTONIX) 40 MG tablet TAKE 1 TABLET(40 MG) BY MOUTH TWICE DAILY   traZODone (DESYREL) 50 MG tablet Take 0.5-1 tablets (25-50 mg total) by mouth at bedtime as needed for sleep.   No facility-administered encounter medications on file as of 11/24/2021.    Allergies (verified) Codeine phosphate, Adhesive [tape], and Codeine   History: Past Medical  History:  Diagnosis Date   Acute gastric ulcer with hemorrhage    Acute on chronic diastolic congestive heart failure ([place]) 10/16/2018   AKI (acute kidney injury) ([place])    Anemia    PMH   Aneurysm (arteriovenous) of coronary vessels    Ankle edema, bilateral 01/08/2021   Atrial fibrillation ([place]) 01/01/2007   BACK PAIN, UPPER 07/09/2008   Breast cancer of upper-outer quadrant of right female breast ([place]) 03/21/2011   Breast cancer, stage 1 ([place])    Bruises easily    Cataract    history   Complication of anesthesia    COVID-19 virus infection 03/08/2020   Diverticulitis    DIVERTICULOSIS, COLON 12/26/2006   Essential hypertension 59/56/3875   Follicular 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 ([place]) 64/33/2951   Follicular non-Hodgkin's lymphoma ([name])    Frozen shoulder, Left 01/08/2021   Full dentures 01/08/2021   Goiter    multi-nodular   Gustatory rhinitis 01/07/2021   Hemorrhoids 04/08/2021   History of atrioventricular nodal ablation 10/16/2018   Hx of colonoscopy 2009   Incontinence of feces 04/08/2021   Influenza 12/18/2020   Insomnia 09/10/2014   LIVER FUNCTION TESTS, ABNORMAL, HX OF 12/26/2007   Macular degeneration, bilateral 01/08/2021   Memory loss 05/16/2007   Mild cognitive impairment 09/23/2021   Qualifier: Diagnosis of  By: Burnice Logan  MD, Doretha Sou    Multiple gastric ulcers 2021   CC Hematemesis; EGD showed mild abnormalities in the esophagus and multiple gastric ulcers   Nocturnal hypoxemia 05/14/2020   OSTEOARTHRITIS 12/26/2006   takes Diclofenac daily but has stopped for surgery   Osteoarthritis 12/26/2006   Qualifier: Diagnosis of  By: Paulina Fusi, RN, Selena [name]    Paraspinal mass 07/16/2014   Pneumonia 11/11/2017   Pneumonia 01/31/2018   Pneumonia due to COVID-19 virus 03/08/2020   PONV (postoperative nausea and vomiting)    S/P ablation of atrial flutter 07/17/2018   S/P ablation of atrial flutter 07/17/2018   Sensorineural hearing loss  (SNHL) of both ears 01/07/2021   Sepsis ([place]) 04/02/2020   Shortness of breath 05/28/2019   Skin cancer    Syncope 08/21/2018   Thoracic aortic aneurysm ([place]) 10/16/2018   Tibialis posterior tendinopathy 04/08/2019   Upper airway cough syndrome 10/27/2017   pfts 10/10/17 s sign airflow obst though the insp loop is somewhat flattened  - Allergy profile 10/26/2017 >  Eos 0.3 /  IgE  6 RAST neg - rx for gerd 10/26/17 > improved 12/07/2017    Vasomotor rhinitis 01/08/2021   VERTIGO 09/07/2009   Has had Vestibular Rehab consultation   Visual impairment 01/07/2021   Wears dentures    Wears glasses    Wears hearing aid    bilateral   Past Surgical History:  Procedure Laterality Date   ABDOMINAL HYSTERECTOMY     ABLATION OF DYSRHYTHMIC FOCUS  2009   APPENDECTOMY     AUGMENTATION MAMMAPLASTY Right 2013  Subpectoral implant   BIOPSY  05/07/2019   Procedure: BIOPSY;  Surgeon: Irving Copas., MD;  Location: [facility];  Service: Gastroenterology;;   BREAST BIOPSY  2013   BREAST RECONSTRUCTION  04/14/2011   Procedure: BREAST RECONSTRUCTION;  Surgeon: Crissie Reese, MD;  Location: [facility];  Service: Plastics;  Laterality: Right;  Placement of Right Breast Tissue Expander with  use of Flex HD for Breast Reconstruction   BREAST SURGERY     right sm, snbx   CATARACT EXTRACTION W/ INTRAOCULAR LENS  IMPLANT, BILATERAL  2010   bilateral   COLONOSCOPY     COLONOSCOPY     ESOPHAGOGASTRODUODENOSCOPY (EGD) WITH PROPOFOL N/A 05/07/2019   Procedure: ESOPHAGOGASTRODUODENOSCOPY (EGD) WITH PROPOFOL;  Surgeon: Irving Copas., MD;  Location: [facility];  Service: Gastroenterology;  Laterality: N/A;   KNEE SURGERY  2004/2010   arthroscopic/ bilateral knee replacements   LOOP RECORDER IMPLANT     MASTECTOMY Right 2013   PORT-A-CATH REMOVAL N/A 12/07/2016   Procedure: REMOVAL PORT-A-CATH;  Surgeon: Rolm Bookbinder, MD;  Location: [facility];  Service: General;  Laterality: N/A;   PORTACATH  PLACEMENT Right 08/11/2014   Procedure: INSERTION PORT-A-CATH WITH ULTRASOUND;  Surgeon: Rolm Bookbinder, MD;  Location: [facility];  Service: General;  Laterality: Right;   [place]   right   tmi  1998   TONSILLECTOMY     TOTAL KNEE ARTHROPLASTY Bilateral    Family History  Problem Relation Age of Onset   Colon cancer Mother    Cancer Mother        colon   Colon cancer Maternal Aunt    Cancer Maternal Uncle    Prostate cancer Maternal Uncle    Prostate cancer Maternal Uncle    Other Maternal Uncle    Cancer Sister        kidney   Cancer Brother        lymph node   Anesthesia problems Neg Hx    Hypotension Neg Hx    Malignant hyperthermia Neg Hx    Pseudochol deficiency Neg Hx    Breast cancer Neg Hx    Social History   Socioeconomic History   Marital status: Widowed    Spouse name: Not on file   Number of children: 2   Years of education: 70   Highest education level: High school graduate  Occupational History   Occupation: retired  Tobacco Use   Smoking status: Former    Packs/day: 0.10    Years: 20.00    Total pack years: 2.00    Types: Cigarettes    Quit date: 01/24/1978    Years since quitting: 43.8    Passive exposure: Past   Smokeless tobacco: Never  Vaping Use   Vaping Use: Never used  Substance and Sexual Activity   Alcohol use: No   Drug use: No   Sexual activity: Not Currently    Birth control/protection: Surgical  Other Topics Concern   Not on file  Social History Narrative   Mrs Longoria's two children live beside her, son and daughter.   She worked at [employer]   She has a walker, cane, BSC, and lift chair at home.   Social Determinants of Health   Financial Resource Strain: Low Risk  (11/24/2021)   Overall Financial Resource Strain (CARDIA)    Difficulty of Paying Living Expenses: Not hard at all  Food Insecurity: No Food Insecurity (11/24/2021)   Hunger Vital Sign    Worried About Running Out  of Food in the Last Year: Never  true    [place] in the Last Year: Never true  Transportation Needs: No Transportation Needs (11/24/2021)   PRAPARE - Hydrologist (Medical): No    Lack of Transportation (Non-Medical): No  Physical Activity: Insufficiently Active (11/24/2021)   Exercise Vital Sign    Days of Exercise per Week: 7 days    Minutes of Exercise per Session: 20 min  Stress: No Stress Concern Present (11/24/2021)   [place]    Feeling of Stress : Not at all  Social Connections: Moderately Integrated (11/24/2021)   Social Connection and Isolation Panel [NHANES]    Frequency of Communication with Friends and Family: More than three times a week    Frequency of Social Gatherings with Friends and Family: More than three times a week    Attends Religious Services: More than 4 times per year    Active Member of [employer] or Organizations: Yes    Attends Archivist Meetings: More than 4 times per year    Marital Status: Widowed    Tobacco Counseling Counseling given: Not Answered   Clinical Intake:  Pre-visit preparation completed: No  Pain : No/denies pain     Nutritional Risks: None Diabetes: No  How often do you need to have someone help you when you read instructions, pamphlets, or other written materials from your doctor or pharmacy?: 3 - Sometimes  Diabetic?  No  Interpreter Needed?: No  Information entered by :: Rolene Arbour LPN   Activities of Daily Living    11/24/2021    2:20 PM  In your present state of health, do you have any difficulty performing the following activities:  Hearing? 0  Vision? 0  Difficulty concentrating or making decisions? 0  Walking or climbing stairs? 0  Dressing or bathing? 0  Doing errands, shopping? 0  Preparing Food and eating ? N  Using the Toilet? N  In the past six months, have you accidently leaked urine? N  Do you have problems with loss of  bowel control? N  Managing your Medications? N  Managing your Finances? N  Housekeeping or managing your Housekeeping? N    Patient Care Team: McDiarmid, Blane Ohara, MD as PCP - General (Family Medicine) Burnell Blanks, MD as PCP - Cardiology (Cardiology) Laurin Coder, MD as Consulting Physician (Pulmonary Disease) Mcarthur Rossetti, MD as Consulting Physician (Orthopedic Surgery) Nicholas Lose, MD as Consulting Physician (Hematology and Oncology) [facility], Melanie Crazier, MD as Consulting Physician (Endocrinology) Noralyn Pick, NP as Nurse Practitioner (Gastroenterology) Evans Lance, MD as Consulting Physician (Cardiology) Rolm Bookbinder, MD as Consulting Physician (General Surgery)  Indicate any recent Medical Services you may have received from other than [facility] providers in the past year (date may be approximate).     Assessment:   This is a routine wellness examination for Kae.  Hearing/Vision screen Hearing Screening - Comments:: Wars hearing aids Vision Screening - Comments:: Wears rx glasses - up to date with routine eye exams with  Dr Jule Ser  Dietary issues and exercise activities discussed: Current Exercise Habits: Home exercise routine, Type of exercise: walking, Time (Minutes): 20, Frequency (Times/Week): 7, Weekly Exercise (Minutes/Week): 140, Intensity: Mild, Exercise limited by: None identified   Goals Addressed               This Visit's Progress     Patient Stated (pt-stated)  Stay healthy       Depression Screen    11/24/2021    2:16 PM 10/28/2021    1:27 PM 10/19/2021    1:39 PM 09/23/2021    3:09 PM 09/23/2021    1:33 PM 08/19/2021    1:25 PM 06/17/2021    2:08 PM  PHQ 2/9 Scores  PHQ - 2 Score 0 0 3 0 0 3   PHQ- 9 Score 0 6 9  0 8   Exception Documentation       Patient refusal    Fall Risk    11/24/2021    2:21 PM 09/23/2021    1:34 PM 08/19/2021    1:24 PM 06/17/2021    2:09 PM 05/06/2021   10:54  AM  Fall Risk   Falls in the past year? 0 0 0 0 0  Number falls in past yr: 0 0  0 0  Injury with Fall? 0 0  0 0  Risk for fall due to : No Fall Risks Impaired balance/gait     Follow up Falls prevention discussed Falls evaluation completed       FALL RISK PREVENTION PERTAINING TO THE HOME:  Any stairs in or around the home? Yes  If so, are there any without handrails? No  Home free of loose throw rugs in walkways, pet beds, electrical cords, etc? Yes  Adequate lighting in your home to reduce risk of falls? Yes   ASSISTIVE DEVICES UTILIZED TO PREVENT FALLS:  Life alert? Yes  Use of a cane, walker or w/c? Yes  Grab bars in the bathroom? Yes  Shower chair or bench in shower? Yes  Elevated toilet seat or a handicapped toilet? Yes   TIMED UP AND GO:  Was the test performed? No . Audio Visit   Cognitive Function:      09/23/2021    3:14 PM 01/08/2021   11:00 AM  Montreal Cognitive Assessment   Visuospatial/ Executive (0/5)  3  Naming (0/3)  2  Attention: Read list of digits (0/2) 0 2  Attention: Read list of letters (0/1) 1 1  Attention: Serial 7 subtraction starting at 100 (0/3) 2 0  Language: Repeat phrase (0/2) 0 0  Language : Fluency (0/1) 0 0  Abstraction (0/2) 1 0  Delayed Recall (0/5) 3 3  Orientation (0/6) 6 6  Total  17  Adjusted Score (based on education)  18      11/24/2021    2:22 PM 11/11/2020    3:12 PM 11/11/2020    1:58 PM 11/05/2019    2:27 PM  6CIT Screen  What Year? 0 points 0 points 0 points 0 points  What month? 0 points 0 points 0 points 0 points  What time? 0 points 0 points 0 points   Count back from 20 0 points 0 points 0 points 0 points  Months in reverse 0 points 4 points 4 points 4 points  Repeat phrase 2 points 8 points 8 points 4 points  Total Score 2 points 12 points 12 points     Immunizations Immunization History  Administered Date(s) Administered   Fluad Quad(high Dose 65+) 09/26/2018   Influenza Split 10/20/2011    Influenza Whole 11/07/2006   Influenza, High Dose Seasonal PF 10/13/2016, 10/12/2017   Influenza-Unspecified 09/07/2013, 10/15/2015, 09/26/2018, 10/01/2019   Moderna Sars-Covid-2 Vaccination 02/04/2019, 03/04/2019, 12/24/2019   Pfizer Covid-19 Vaccine Bivalent Booster 52yr & up 01/07/2021   Pneumococcal Conjugate-13 08/26/2013   Pneumococcal Polysaccharide-23 10/29/2007, 08/28/2017  Td 05/27/2008   Tdap 05/30/2018   Zoster, Live 10/29/2007    TDAP status: Up to date    Pneumococcal vaccine status: Up to date  Covid-19 vaccine status: Completed vaccines  Qualifies for Shingles Vaccine? Yes   Zostavax completed No   Shingrix Completed?: No.    Education has been provided regarding the importance of this vaccine. Patient has been advised to call insurance company to determine out of pocket expense if they have not yet received this vaccine. Advised may also receive vaccine at local pharmacy or Health Dept. Verbalized acceptance and understanding.  Screening Tests Health Maintenance  Topic Date Due   COVID-19 Vaccine (5 - Moderna risk series) 03/04/2021   Zoster Vaccines- Shingrix (1 of 2) 02/24/2022 (Originally 10/14/1954)   Medicare Annual Wellness (AWV)  11/25/2022   TETANUS/TDAP  05/29/2028   Pneumonia Vaccine 99+ Years old  Completed   DEXA SCAN  Completed   HPV VACCINES  Aged Out   COLONOSCOPY (Pts 45-46yr Insurance coverage will need to be confirmed)  Discontinued    Health Maintenance  Health Maintenance Due  Topic Date Due   COVID-19 Vaccine (5 - Moderna risk series) 03/04/2021    Colorectal cancer screening: No longer required.   Mammogram status: No longer required due to Age.  Bone Density status: Completed 06/24/13. Results reflect: Bone density results: OSTEOPOROSIS. Repeat every   years.  Lung Cancer Screening: (Low Dose CT Chest recommended if Age 84-80years, 30 pack-year currently smoking OR have quit w/in 15years.) does not qualify.     Additional  Screening:  Hepatitis C Screening: does not qualify; Completed    Vision Screening: Recommended annual ophthalmology exams for early detection of glaucoma and other disorders of the eye. Is the patient up to date with their annual eye exam?  Yes  Who is the provider or what is the name of the office in which the patient attends annual eye exams? Dr GJule SerIf pt is not established with a provider, would they like to be referred to a provider to establish care? No .   Dental Screening: Recommended annual dental exams for proper oral hygiene  Community Resource Referral / Chronic Care Management: CRR required this visit?  No   CCM required this visit?  No      Plan:     I have personally reviewed and noted the following in the patient's chart:   Medical and social history Use of alcohol, tobacco or illicit drugs  Current medications and supplements including opioid prescriptions. Patient is not currently taking opioid prescriptions. Functional ability and status Nutritional status Physical activity Advanced directives List of other physicians Hospitalizations, surgeries, and ER visits in previous 12 months Vitals Screenings to include cognitive, depression, and falls Referrals and appointments  In addition, I have reviewed and discussed with patient certain preventive protocols, quality metrics, and best practice recommendations. A written personalized care plan for preventive services as well as general preventive health recommendations were provided to patient.     Selena Peaches LPN   140/01/270  Nurse Notes: None

## 2021-11-29 ENCOUNTER — Telehealth: Payer: Self-pay | Admitting: Cardiovascular Disease

## 2021-11-29 NOTE — Telephone Encounter (Signed)
Spoke with pt at [employer] PA  request  Pt has appt  with Melina Copa PA and Dayna wanted to make sure pt has  f/u with PCP as well since c/o night sweats Per pt thought was calling PCP wants to cx appt for tomorrow . Per pt thinks has pneumonia again is having the same symptoms  Will call PCP to be seen .Adonis Housekeeper

## 2021-11-29 NOTE — Telephone Encounter (Signed)
Patient states she been having night sweats, would like to speak to the nurse. Please advise

## 2021-11-30 ENCOUNTER — Ambulatory Visit
Admission: RE | Admit: 2021-11-30 | Discharge: 2021-11-30 | Disposition: A | Discharge status: Home / Self Care | Payer: Medicare Other | Source: Ambulatory Visit | Attending: Family Medicine | Admitting: Family Medicine

## 2021-11-30 ENCOUNTER — Ambulatory Visit: Payer: Medicare Other | Admitting: Family Medicine

## 2021-11-30 ENCOUNTER — Ambulatory Visit: Payer: Medicare Other | Admitting: Physician Assistant

## 2021-11-30 VITALS — BP 121/80 | HR 84 | Temp 97.8°F | Ht 65.0 in | Wt 179.0 lb

## 2021-11-30 DIAGNOSIS — Z170 Estrogen receptor positive status [ER+]: Secondary | ICD-10-CM

## 2021-11-30 DIAGNOSIS — R058 Other specified cough: Secondary | ICD-10-CM | POA: Diagnosis not present

## 2021-11-30 DIAGNOSIS — J849 Interstitial pulmonary disease, unspecified: Secondary | ICD-10-CM

## 2021-11-30 DIAGNOSIS — J189 Pneumonia, unspecified organism: Secondary | ICD-10-CM

## 2021-11-30 DIAGNOSIS — R5383 Other fatigue: Secondary | ICD-10-CM | POA: Diagnosis not present

## 2021-11-30 DIAGNOSIS — C50411 Malignant neoplasm of upper-outer quadrant of right female breast: Secondary | ICD-10-CM

## 2021-11-30 DIAGNOSIS — R059 Cough, unspecified: Secondary | ICD-10-CM

## 2021-11-30 NOTE — Progress Notes (Signed)
SUBJECTIVE:   CHIEF COMPLAINT / HPI:   Night sweats, fatigue, dizziness Selena [name] is a pleasant 86 yo woman who presents today with ongoing fatigue, productive cough, dizziness.  She also reports recurrence of drenching night sweats.  She was previously evaluated at [facility] 9/26 for cough, body ache, congestion, runny nose, and diarrhea started 9/21.  Couple episodes of night sweats reported.  Dx viral URI, Rx 5 mg prednisone x5 days.  She was then reevaluated at [facility] 10/5 for continuing symptoms including dyspnea on exertion.  Dx bacterial pneumonia, Rx cefdinir and doxycycline x7 days.  Today, patient returns saying that her symptoms did not improve significantly after treatment with prednisone, cefdinir, and doxycycline.  She has mainly experienced continued productive cough over the last month.  She does note that she has worsening severe fatigue that limits her daily ADLs and IADLs around the house.  She also began experiencing drenching night sweats again, one episode each on 'Sunday and Monday nights.  She also reports dizziness that is different from her baseline vertigo, increased bowel movements from baseline (BM x4 today, baseline 1 daily), and a feeling of an enlarged head.  She does not have a history of asthma or COPD.  Chart review indicates stable weight over the last 6 months, ranges 173 to 183 pounds in the chart over that time.  Today 179 pounds.  Recent pertinent studies: Echo 11/08/2021: EF 55-60%, normal LV function, severely dilated LA, mildly dilated RA, trivial MV regurg  CXR 10/19/21: Increased reticulonodular opacities compatible with multifocal infection.  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 OSA on CPAP  OBJECTIVE:   BP 121/80   Pulse 84   Temp 97.8 F (36.6 C)   Ht 5\' 5" (1.651 m)   Wt 179 lb (81.2 kg)   SpO2 96%   BMI 29.79 kg/m    PHQ-9:     11'$ /01/2021    2:16 PM 10/28/2021    1:27 PM 10/19/2021    1:39 PM  Depression screen PHQ 2/9  Decreased Interest 0 0 2  Down, Depressed, Hopeless 0 0 1  PHQ - 2 Score 0 0 3  Altered sleeping 0 2 2  Tired, decreased energy 0 3 2  Change in appetite 0 1 2  Feeling bad or failure about yourself  0 0 0  Trouble concentrating 0 0 0  Moving slowly or fidgety/restless 0 0 0  Suicidal thoughts 0 0 0  PHQ-9 Score 0 6 9  Difficult doing work/chores Not difficult at all Very difficult    Physical Exam General: Awake, alert, oriented HEENT: PERRL, bilateral TM pearly pink and flat, bilateral external auditory canals with minimal cerumen burden, no lesions, nasal mucosa slightly edematous, oral mucosa pink, moist, without lesion, intact dentition without obvious cavity, tonsils unremarkable in size Lymph: No palpable lymphedema of head or neck, although anterior neck tender to palpation Cardiovascular: Regular rate and rhythm auscultated and palpated (despite known history of permanent A-fib) no murmurs auscultated  Respiratory: Anterior and superior  fields clear, bibasilar crackles in posterior fields Extremities: 1+ pitting edema on left, trace on right  ASSESSMENT/PLAN:   Productive cough 2-day duration of acute recurrent episode.  Previously treated as pneumonia, although patient reports minimal improvement with prednisone, cefdinir, and doxycycline.  Differential is broad but includes atypical or resistant pneumonia, cough variant  asthma, flare of interstitial lung disease, or recurrent malignancy.  CXR obtained today does not yet have radiologist read submitted, but on my review appears improved from last.  No obvious infiltrates indicative of pneumonia.  Given she is afebrile and had minimal improvement with last round of antibiotics, I am hesitant to prescribe antibiotics at this time.  Given her significant cancer history and worsening fatigue, will obtain labs today: CBC with differential, CMP, smear, TSH.  Also obtain vitamin D as patient reports her ENT daughter suggested this and would really like this evaluated.  We will reach out to her heme-onc physician for their opinion.     Ezequiel Essex, MD [place]

## 2021-11-30 NOTE — Assessment & Plan Note (Signed)
2-day duration of acute recurrent episode.  Previously treated as pneumonia, although patient reports minimal improvement with prednisone, cefdinir, and doxycycline.  Differential is broad but includes atypical or resistant pneumonia, cough variant asthma, flare of interstitial lung disease, or recurrent malignancy.  CXR obtained today does not yet have radiologist read submitted, but on my review appears improved from last.  No obvious infiltrates indicative of pneumonia.  Given she is afebrile and had minimal improvement with last round of antibiotics, I am hesitant to prescribe antibiotics at this time.  Given her significant cancer history and worsening fatigue, will obtain labs today: CBC with differential, CMP, smear, TSH.  Also obtain vitamin D as patient reports her ENT daughter suggested this and would really like this evaluated.  We will reach out to her heme-onc physician for their opinion.

## 2021-11-30 NOTE — Patient Instructions (Addendum)
It was wonderful to see you today. Thank you for allowing me to be a part of your care. Below is a short summary of what we discussed at your visit today:  Continue cough No antibiotics at this time, I am not quite convinced this is pneumonia.  Today we collected blood work. If the results are normal, I will send you a letter or MyChart message. If the results are abnormal, I will give you a call.    Go get a chest x-ray at [place] in the [facility].  [address] [facility]) [address], [place] [address] Phone [phone] Fax [phone]  Hours of Operation Ultrasound and Diagnostic: Monday - Friday, 8 am-5 pm CT scan: Monday - Friday, 8 am-5:30 pm  I will wait till the results of the lab work and x-ray come back before giving antibiotics.  I will also reach out to your oncologist to see if they want to do any extra scans.   Please bring all of your medications to every appointment!  If you have any questions or concerns, please do not hesitate to contact us via phone or MyChart message.   Ezequiel Essex, MD

## 2021-12-01 ENCOUNTER — Other Ambulatory Visit: Payer: Self-pay | Admitting: Family Medicine

## 2021-12-01 DIAGNOSIS — Z1231 Encounter for screening mammogram for malignant neoplasm of breast: Secondary | ICD-10-CM

## 2021-12-01 LAB — COMPREHENSIVE METABOLIC PANEL
ALT: 11 IU/L (ref 0–32)
AST: 15 IU/L (ref 0–40)
Albumin/Globulin Ratio: 2.5 — ABNORMAL HIGH (ref 1.2–2.2)
Albumin: 4.5 g/dL (ref 3.7–4.7)
Alkaline Phosphatase: 119 IU/L (ref 44–121)
BUN/Creatinine Ratio: 25 (ref 12–28)
BUN: 17 mg/dL (ref 8–27)
Bilirubin Total: 0.5 mg/dL (ref 0.0–1.2)
CO2: 22 mmol/L (ref 20–29)
Calcium: 9.7 mg/dL (ref 8.7–10.3)
Chloride: 101 mmol/L (ref 96–106)
Creatinine, Ser: 0.69 mg/dL (ref 0.57–1.00)
Globulin, Total: 1.8 g/dL (ref 1.5–4.5)
Glucose: 88 mg/dL (ref 70–99)
Potassium: 4.4 mmol/L (ref 3.5–5.2)
Sodium: 142 mmol/L (ref 134–144)
Total Protein: 6.3 g/dL (ref 6.0–8.5)
eGFR: 86 mL/min/{1.73_m2} (ref >59)

## 2021-12-01 LAB — CBC WITH DIFFERENTIAL
Basophils Absolute: 0.1 10*3/uL (ref 0.0–0.2)
Basos: 1 %
EOS (ABSOLUTE): 0.8 10*3/uL — ABNORMAL HIGH (ref 0.0–0.4)
Eos: 7 %
Hematocrit: 47.7 % — ABNORMAL HIGH (ref 34.0–46.6)
Hemoglobin: 15.7 g/dL (ref 11.1–15.9)
Immature Grans (Abs): 0.1 10*3/uL (ref 0.0–0.1)
Immature Granulocytes: 1 %
Lymphocytes Absolute: 1.7 10*3/uL (ref 0.7–3.1)
Lymphs: 15 %
MCH: 30.7 pg (ref 26.6–33.0)
MCHC: 32.9 g/dL (ref 31.5–35.7)
MCV: 93 fL (ref 79–97)
Monocytes Absolute: 1.1 10*3/uL — ABNORMAL HIGH (ref 0.1–0.9)
Monocytes: 10 %
Neutrophils Absolute: 7.7 10*3/uL — ABNORMAL HIGH (ref 1.4–7.0)
Neutrophils: 66 %
RBC: 5.12 x10E6/uL (ref 3.77–5.28)
RDW: 12.8 % (ref 11.7–15.4)
WBC: 11.4 10*3/uL — ABNORMAL HIGH (ref 3.4–10.8)

## 2021-12-01 LAB — TSH: TSH: 0.563 u[IU]/mL (ref 0.450–4.500)

## 2021-12-01 LAB — VITAMIN D 25 HYDROXY (VIT D DEFICIENCY, FRACTURES): Vit D, 25-Hydroxy: 41.9 ng/mL (ref 30.0–100.0)

## 2021-12-02 ENCOUNTER — Telehealth: Payer: Self-pay | Admitting: Family Medicine

## 2021-12-02 ENCOUNTER — Other Ambulatory Visit: Payer: Self-pay | Admitting: *Deleted

## 2021-12-02 DIAGNOSIS — C8203 Follicular lymphoma grade I, intra-abdominal lymph nodes: Secondary | ICD-10-CM

## 2021-12-02 LAB — PATHOLOGIST SMEAR REVIEW
Basophils Absolute: 0.1 x10E3/uL (ref 0.0–0.2)
Basos: 1 %
EOS (ABSOLUTE): 0.7 x10E3/uL — ABNORMAL HIGH (ref 0.0–0.4)
Eos: 7 %
Hematocrit: 47.6 % — ABNORMAL HIGH (ref 34.0–46.6)
Hemoglobin: 15.7 g/dL (ref 11.1–15.9)
Immature Grans (Abs): 0 x10E3/uL (ref 0.0–0.1)
Immature Granulocytes: 0 %
Lymphocytes Absolute: 1.6 x10E3/uL (ref 0.7–3.1)
Lymphs: 15 %
MCH: 30.7 pg (ref 26.6–33.0)
MCHC: 33 g/dL (ref 31.5–35.7)
MCV: 93 fL (ref 79–97)
Monocytes Absolute: 1.1 x10E3/uL — ABNORMAL HIGH (ref 0.1–0.9)
Monocytes: 9 %
Neutrophils Absolute: 7.6 x10E3/uL — ABNORMAL HIGH (ref 1.4–7.0)
Neutrophils: 68 %
Platelets: 247 x10E3/uL (ref 150–450)
RBC: 5.11 x10E6/uL (ref 3.77–5.28)
RDW: 12.8 % (ref 11.7–15.4)
WBC: 11.2 x10E3/uL — ABNORMAL HIGH (ref 3.4–10.8)

## 2021-12-02 NOTE — Progress Notes (Signed)
Per MD request orders placed for CT CAP with contrast for lymphoma evaluation.  Appt will be scheduled once PA is obtained.

## 2021-12-02 NOTE — Telephone Encounter (Signed)
Called patient to discuss results that are back thus far. Still awaiting CXR read and blood smear.   Discussed with patient that I do not have any evidence that this is pneumonia given history, physical, imaging, and labs.  No antibiotics at this time, she is amenable.  Discussed that I am quite concerned for recurrence of cancer.  Relayed that I reached out to her oncologist who is ordering a repeat chest scan and follow-up appointment.  She has already scheduled the chest CT.  She is quite thankful for the call. She also relates thanks to Dr. McDiarmid who has helped her with her Eliquis prescription.  Ezequiel Essex, MD

## 2021-12-02 NOTE — Telephone Encounter (Signed)
-----   Message from Nicholas Lose, MD sent at 12/02/2021  7:59 AM EST ----- Thank you Barnetta Chapel for your message. It could be relapsed lymphoma. I will obtain a CT chest abdomen and pelvis and see her back for a follow-up. We will keep you posted. Audley Hose, please order CT chest abdomen pelvis with contrast for lymphoma evaluation. Follow-up with me 3 days after the scan. Thank you VG ----- Message ----- From: Ezequiel [place], MD Sent: 11/30/2021   6:30 PM EST To: Nicholas Lose, MD  Dr. Lindi Adie,  I saw our mutual patient today, Ms. Selena [name]. I am concerned about her recent development of drenching night sweats and profound fatigue over the last 1.5 months. CXR unremarkable, does not give me an infectious etiology. Still awaiting blood work results. Do you think you could review and determine if she needs heme-onc follow up? Thank you so much,  Ezequiel Essex, MD

## 2021-12-07 NOTE — Progress Notes (Signed)
[facility] Return Visit  SUBJECTIVE  History of Present Illness: Selena [name] is a 86 y.o. female seen in follow-up for OAB. Plan at last visit was to reduce bladder irritants and consider PT referral. At last visit she was unable to give an appropriate squeeze of her internal muscles.  She reports since decreasing bladder irritants she has not been leaking. She reports 1 episode of leaking related to having CT contrast. Denies any prolapse symptoms but reports a small "knot" she would like examined today in the perineal region.     Past Medical History: Patient  has a past medical history of Acute gastric ulcer with hemorrhage, Acute on chronic diastolic congestive heart failure ([place]) (10/16/2018), AKI (acute kidney injury) ([place]), Anemia, Aneurysm (arteriovenous) of coronary vessels, Ankle edema, bilateral (01/08/2021), Atrial fibrillation ([place]) (01/01/2007), BACK PAIN, UPPER (07/09/2008), Breast cancer of upper-outer quadrant of right female breast ([place]) (03/21/2011), Breast cancer, stage 1 ([place]), Bruises easily, Cataract, Complication of anesthesia, COVID-19 virus infection (03/08/2020), Diverticulitis, DIVERTICULOSIS, COLON (12/26/2006), Essential hypertension (97/02/6376), Follicular 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 ([place]) (58/85/0277), Follicular non-Hodgkin's lymphoma ([place]), Frozen shoulder, Left (01/08/2021), Full dentures (01/08/2021), Goiter, Gustatory rhinitis (01/07/2021), Hemorrhoids (04/08/2021), History of atrioventricular nodal ablation (10/16/2018), colonoscopy (2009), Incontinence of feces (04/08/2021), Influenza (12/18/2020), Insomnia (09/10/2014), LIVER FUNCTION TESTS, ABNORMAL, HX OF (12/26/2007), Macular degeneration, bilateral (01/08/2021), Memory loss (05/16/2007), Mild cognitive impairment (09/23/2021), Multiple gastric ulcers (2021), Nocturnal hypoxemia (05/14/2020), OSTEOARTHRITIS (12/26/2006), Osteoarthritis (12/26/2006), Paraspinal mass  (07/16/2014), Pneumonia (11/11/2017), Pneumonia (01/31/2018), Pneumonia due to COVID-19 virus (03/08/2020), PONV (postoperative nausea and vomiting), S/P ablation of atrial flutter (07/17/2018), S/P ablation of atrial flutter (07/17/2018), Sensorineural hearing loss (SNHL) of both ears (01/07/2021), Sepsis ([place]) (04/02/2020), Shortness of breath (05/28/2019), Skin cancer, Syncope (08/21/2018), Thoracic aortic aneurysm ([place]) (10/16/2018), Tibialis posterior tendinopathy (04/08/2019), Upper airway cough syndrome (10/27/2017), Vasomotor rhinitis (01/08/2021), VERTIGO (09/07/2009), Visual impairment (01/07/2021), Wears dentures, Wears glasses, and Wears hearing aid.   Past Surgical History: She  has a past surgical history that includes Abdominal hysterectomy; Rotator cuff repair (1999); Knee surgery (2004/2010); Cataract extraction w/ intraocular lens  implant, bilateral (2010); Colonoscopy; Appendectomy; Ablation of dysrhythmic focus (2009); tmi (1998); Colonoscopy; Breast biopsy (2013); Breast reconstruction (04/14/2011); Breast surgery; Tonsillectomy; Portacath placement (Right, 08/11/2014); Mastectomy (Right, 2013); Augmentation mammaplasty (Right, 2013); Port-a-cath removal (N/A, 12/07/2016); Loop recorder implant; Esophagogastroduodenoscopy (egd) with propofol (N/A, 05/07/2019); biopsy (05/07/2019); and Total knee arthroplasty (Bilateral).   Medications: She has a current medication list which includes the following prescription(s): albuterol, amlodipine, apixaban, azelastine-fluticasone, chlorpheniramine, famotidine, fluticasone, furosemide, guaifenesin-codeine, metoprolol tartrate, montelukast, NON FORMULARY, pantoprazole, and trazodone.   Allergies: Patient is allergic to codeine phosphate, adhesive [tape], and codeine.   Social History: Patient  reports that she quit smoking about 43 years ago. Her smoking use included cigarettes. She has a 2.00 pack-year smoking history. She has been exposed to  tobacco smoke. She has never used smokeless tobacco. She reports that she does not drink alcohol and does not use drugs.      OBJECTIVE     Physical Exam: Vitals:   12/10/21 0942  BP: 128/76  Pulse: 64   Gen: No apparent distress, A&O x 3.  Detailed Urogynecologic Evaluation:  Deferred.    ASSESSMENT AND PLAN    Ms. Selena [name] is a 86 y.o. with:  1. Sebaceous cyst   2. Overactive bladder    Small sebaceous cyst noted to be draining on the right side of the perineum. No inflammation noted but small amount of pus expressed.  Encouraged patient to do a warm compress to the area and encouraged her to follow up if it gets more painful or becomes bothersome.  She has been managing well with decreasing bladder irritants. Will have her follow up PRN. Encouraged her to come back if she has an increased amount of urgency, overactive bladder symptoms or if her prolapse becomes bothersome.   Patient is agreeable to plan of care and will follow up PRN.   All questions answered.

## 2021-12-09 ENCOUNTER — Ambulatory Visit (HOSPITAL_BASED_OUTPATIENT_CLINIC_OR_DEPARTMENT_OTHER)
Admission: RE | Admit: 2021-12-09 | Discharge: 2021-12-09 | Disposition: A | Discharge status: Home / Self Care | Payer: Medicare Other | Source: Ambulatory Visit | Attending: Hematology and Oncology | Admitting: Hematology and Oncology

## 2021-12-09 DIAGNOSIS — C8203 Follicular lymphoma grade I, intra-abdominal lymph nodes: Secondary | ICD-10-CM | POA: Diagnosis present

## 2021-12-09 MED ORDER — IOHEXOL 300 MG/ML  SOLN
100.0000 mL | Freq: Once | INTRAMUSCULAR | Status: AC | PRN
  Administered 2021-12-09: 80 mL via INTRAVENOUS

## 2021-12-10 ENCOUNTER — Encounter: Payer: Self-pay | Admitting: Obstetrics and Gynecology

## 2021-12-10 ENCOUNTER — Other Ambulatory Visit: Payer: Self-pay | Admitting: Adult Health

## 2021-12-10 ENCOUNTER — Ambulatory Visit: Payer: Medicare Other | Admitting: Obstetrics and Gynecology

## 2021-12-10 VITALS — BP 128/76 | HR 64

## 2021-12-10 DIAGNOSIS — L723 Sebaceous cyst: Secondary | ICD-10-CM | POA: Diagnosis not present

## 2021-12-10 DIAGNOSIS — N3281 Overactive bladder: Secondary | ICD-10-CM

## 2021-12-10 DIAGNOSIS — R1900 Intra-abdominal and pelvic swelling, mass and lump, unspecified site: Secondary | ICD-10-CM

## 2021-12-10 NOTE — Progress Notes (Signed)
CT scan showing indeterminate lymphadenopathy and soft tissue nodules in left common iliac chain and sigmoid mesocolon.  Reviewed with patient and ordered PET scan.  Provided education on both.  She has f/u scheduled with Dr. Lindi Adie on Tuesday, 11/21.   Wilber Bihari, NP 12/10/21 3:53 PM Medical Oncology and Hematology [facility] [place], [place] [address] Tel. [phone]    Fax. [phone]

## 2021-12-13 NOTE — Progress Notes (Unsigned)
Patient Care Team: McDiarmid, Blane Ohara, MD as PCP - General (Family Medicine) Burnell Blanks, MD as PCP - Cardiology (Cardiology) Laurin Coder, MD as Consulting Physician (Pulmonary Disease) Mcarthur Rossetti, MD as Consulting Physician (Orthopedic Surgery) Nicholas Lose, MD as Consulting Physician (Hematology and Oncology) [facility], Melanie Crazier, MD as Consulting Physician (Endocrinology) Noralyn Pick, NP as Nurse Practitioner (Gastroenterology) Evans Lance, MD as Consulting Physician (Cardiology) Rolm Bookbinder, MD as Consulting Physician (General Surgery)  DIAGNOSIS: No diagnosis found.  SUMMARY OF ONCOLOGIC HISTORY: Oncology History  Breast cancer of upper-outer quadrant of right female breast ([place])  04/14/2011 Surgery   Right mastectomy: 2 foci of invasive ductal carcinoma 0.2 cm, grade 1 with background extensive DCIS, 0/5 lymph nodes negative,focus 1: ER 94%, PR 100%, Ki-67 5%, HER-2 negative: Focus to ER 100%BR 6% Ki-67 29% HER-2 negative   05/02/2011 -  Anti-estrogen oral therapy   letrozole 2.5 mg daily   Follicular 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 ([name])  07/24/2014 Initial Diagnosis   Retroperitoneal mass biopsy left side: Follicular lymphoma low-grade grade 1-2, flow cytometry positive for CD10, 20, 3, 10, 5, BCL-2, BCl-6, CD21, Ki-67 less than 10%   08/13/2014 - 12/06/2016 Chemotherapy   Bendamustine and Rituxan day 1, 2 every 4 weeks 6 cycles followed by Rituxan maintenance every 2 months 2 years completed 12/06/2016   11/03/2014 Imaging   midpoint of chemotherapy: Moderate improvement in the periaortic lymphadenopathy without complete resolution   01/16/2015 PET scan    interval decrease in size and metabolic activity of the large left retroperitoneal mass , no increase in activity, size 10 mm decreased from 48 mm  SUV 1.9 , no additional hypermetabolic activity     CHIEF COMPLIANT:   INTERVAL HISTORY:  Selena [name] is a   ALLERGIES:  is allergic to codeine phosphate, adhesive [tape], and codeine.  MEDICATIONS:  Current Outpatient Medications  Medication Sig Dispense Refill   albuterol (VENTOLIN HFA) 108 (90 Base) MCG/ACT inhaler INHALE 2 PUFFS INTO THE LUNGS EVERY 4 HOURS AS NEEDED FOR WHEEZING OR SHORTNESS OF BREATH 18 g 0   amLODipine (NORVASC) 5 MG tablet Take 1 tablet (5 mg total) by mouth daily. 90 tablet 3   apixaban (ELIQUIS) 5 MG TABS tablet TAKE 1 TABLET(5 MG) BY MOUTH TWICE DAILY 60 tablet 5   Azelastine-Fluticasone 137-50 MCG/ACT SUSP Place 1 puff into the nose daily. 23 g 12   chlorpheniramine (CHLOR-TRIMETON) 4 MG tablet Take 1 tablet (4 mg total) by mouth 2 (two) times daily as needed for allergies. 60 tablet 5   famotidine (PEPCID) 20 MG tablet TAKE 1 TABLET(20 MG) BY MOUTH AT BEDTIME 90 tablet 1   fluticasone (FLOVENT HFA) 110 MCG/ACT inhaler Inhale 2 puffs into the lungs in the morning and at bedtime. 1 each 2   furosemide (LASIX) 20 MG tablet Take 1 tablet (20 mg total) by mouth daily. 5 tablet 0   guaiFENesin-codeine 100-10 MG/5ML syrup TAKE 2.5 TO 5 ML BY MOUTH THREE TIMES DAILY AS NEEDED FOR COUGH 120 mL 0   metoprolol tartrate (LOPRESSOR) 25 MG tablet Take 1 tablet (25 mg total) by mouth 2 (two) times daily. 180 tablet 3   montelukast (SINGULAIR) 10 MG tablet TAKE 1 TABLET(10 MG) BY MOUTH AT BEDTIME 90 tablet 2   NON FORMULARY CBD oil     pantoprazole (PROTONIX) 40 MG tablet TAKE 1 TABLET(40 MG) BY MOUTH TWICE DAILY 180 tablet 1   traZODone (DESYREL) 50 MG  tablet Take 0.5-1 tablets (25-50 mg total) by mouth at bedtime as needed for sleep. 30 tablet 3   No current facility-administered medications for this visit.    PHYSICAL EXAMINATION: ECOG PERFORMANCE STATUS: {CHL ONC ECOG PS:[id number]}  There were no vitals filed for this visit. There were no vitals filed for this visit.  BREAST:*** No palpable masses or nodules in either right or left breasts. No  palpable axillary supraclavicular or infraclavicular adenopathy no breast tenderness or nipple discharge. (exam performed in the presence of a chaperone)  LABORATORY DATA:  I have reviewed the data as listed    Latest Ref Rng & Units 11/30/2021    4:57 PM 10/27/2021    3:19 PM 08/04/2021   12:32 PM  CMP  Glucose 70 - 99 mg/dL 88  91  103   BUN 8 - 27 mg/dL _0 Creatinine 0.57 - 1.00 mg/dL 0.69  0.66  0.72   Sodium 134 - 144 mmol/L 142  143  140   Potassium 3.5 - 5.2 mmol/L 4.4  4.1  4.0   Chloride 96 - 106 mmol/L 101  103  105   CO2 20 - 29 mmol/L _1 Calcium 8.7 - 10.3 mg/dL 9.7  9.3  9.5   Total Protein 6.0 - 8.5 g/dL 6.3     Total Bilirubin 0.0 - 1.2 mg/dL 0.5     Alkaline Phos 44 - 121 IU/L 119     AST 0 - 40 IU/L 15     ALT 0 - 32 IU/L 11       Lab Results  Component Value Date   WBC 11.4 (H) 11/30/2021   HGB 15.7 11/30/2021   HCT 47.7 (H) 11/30/2021   MCV 93 11/30/2021   PLT 247 11/30/2021   NEUTROABS 7.7 (H) 11/30/2021    ASSESSMENT & PLAN:  No problem-specific Assessment & Plan notes found for this encounter.    No orders of the defined types were placed in this encounter.  The patient has a good understanding of the overall plan. she agrees with it. she will call with any problems that may develop before the next visit here. Total time spent: 30 mins including face to face time and time spent for planning, charting and co-ordination of care   Suzzette Righter, [place] 12/13/21    .ds

## 2021-12-14 ENCOUNTER — Other Ambulatory Visit: Payer: Self-pay

## 2021-12-14 ENCOUNTER — Inpatient Hospital Stay: Payer: Medicare Other | Attending: Hematology and Oncology | Admitting: Hematology and Oncology

## 2021-12-14 VITALS — BP 132/69 | HR 85 | Temp 97.2°F | Resp 18 | Ht 65.0 in | Wt 175.5 lb

## 2021-12-14 DIAGNOSIS — Z79899 Other long term (current) drug therapy: Secondary | ICD-10-CM | POA: Insufficient documentation

## 2021-12-14 DIAGNOSIS — Z7951 Long term (current) use of inhaled steroids: Secondary | ICD-10-CM | POA: Insufficient documentation

## 2021-12-14 DIAGNOSIS — C50411 Malignant neoplasm of upper-outer quadrant of right female breast: Secondary | ICD-10-CM | POA: Insufficient documentation

## 2021-12-14 DIAGNOSIS — C8203 Follicular lymphoma grade I, intra-abdominal lymph nodes: Secondary | ICD-10-CM | POA: Diagnosis not present

## 2021-12-14 DIAGNOSIS — Z170 Estrogen receptor positive status [ER+]: Secondary | ICD-10-CM | POA: Insufficient documentation

## 2021-12-14 DIAGNOSIS — Z9221 Personal history of antineoplastic chemotherapy: Secondary | ICD-10-CM | POA: Insufficient documentation

## 2021-12-14 DIAGNOSIS — Z7901 Long term (current) use of anticoagulants: Secondary | ICD-10-CM | POA: Insufficient documentation

## 2021-12-14 DIAGNOSIS — C8200 Follicular lymphoma grade I, unspecified site: Secondary | ICD-10-CM | POA: Insufficient documentation

## 2021-12-14 DIAGNOSIS — Z79811 Long term (current) use of aromatase inhibitors: Secondary | ICD-10-CM | POA: Insufficient documentation

## 2021-12-14 NOTE — Assessment & Plan Note (Signed)
Right breast cancer: Right mastectomy 04/14/2011: 2 foci of invasive ductal carcinoma 0.2 cm, grade 1 with background extensive DCIS, 0/5 lymph nodes negative,focus 1: ER 94%, PR 100%, Ki-67 5%, HER-2 negative: Focus to ER 100% PR 6% Ki-67 29% HER-2 negative  Femara 2.5 mg daily from 2013- 2018. Because her primary tumor was very small and low risk, we did not recommend extended adjuvant therapy.   Breast Cancer Surveillance: 1. Breast exam 12/14/2021: Right mastectomy 2. Mammogram 10/23/2020 no abnormalities. Postsurgical changes. Breast Density Category B.  Return to clinic in 1 year for follow-up

## 2021-12-14 NOTE — Assessment & Plan Note (Signed)
Retroperitoneal mass biopsy left side 11/24/5943: Follicular lymphoma low-grade grade 1-2, flow cytometry positive for CD10, 20, 3, 10, 5, BCL-2, BCl-6, CD21, Ki-67 less than 10%, stage IB (8X 4 cm mass)   Prior treatment:systemic chemotherapy with bendamustine and Rituxan 6 cycles (  From 08/13/2014 to 01/01/2015) followed by Rituxan maintenance every 2 months for 2 years completed 12/06/2016  CT CAP 12/10/2021: Multiple lymph nodes/soft tissue nodules left common iliac chain and in the sigmoid mesocolon, diffuse subpleural reticulation, 6 mm right upper lobe pulmonary nodule stable  PET/CT has been ordered.  I suspect that these lymph nodes are related to follicular lymphoma. Since the patient is asymptomatic we can watch and monitor this.

## 2021-12-20 ENCOUNTER — Telehealth: Payer: Self-pay | Admitting: Family Medicine

## 2021-12-20 NOTE — Telephone Encounter (Signed)
Patient dropped off patient assistance form to be completed. Last DOS was 11/30/21. Placed in [employer].

## 2021-12-24 NOTE — Telephone Encounter (Signed)
Application for medication assistance placed in front office pharmacy box for completion.

## 2021-12-24 NOTE — Telephone Encounter (Signed)
Form was given to PCP while in clinic yesterday. Salvatore Marvel, CMA

## 2021-12-26 ENCOUNTER — Other Ambulatory Visit: Payer: Self-pay | Admitting: Nurse Practitioner

## 2021-12-28 NOTE — Progress Notes (Signed)
Patient Care Team: McDiarmid, Blane Ohara, MD as PCP - General (Family Medicine) Burnell Blanks, MD as PCP - Cardiology (Cardiology) Laurin Coder, MD as Consulting Physician (Pulmonary Disease) Mcarthur Rossetti, MD as Consulting Physician (Orthopedic Surgery) Nicholas Lose, MD as Consulting Physician (Hematology and Oncology) [facility], Melanie Crazier, MD as Consulting Physician (Endocrinology) Noralyn Pick, NP as Nurse Practitioner (Gastroenterology) Evans Lance, MD as Consulting Physician (Cardiology) Rolm Bookbinder, MD as Consulting Physician (General Surgery)  DIAGNOSIS:  Encounter Diagnosis  Name Primary?   Follicular 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 ([place]) Yes    SUMMARY OF ONCOLOGIC HISTORY: Oncology History  Breast cancer of upper-outer quadrant of right female breast ([place])  04/14/2011 Surgery   Right mastectomy: 2 foci of invasive ductal carcinoma 0.2 cm, grade 1 with background extensive DCIS, 0/5 lymph nodes negative,focus 1: ER 94%, PR 100%, Ki-67 5%, HER-2 negative: Focus to ER 100%BR 6% Ki-67 29% HER-2 negative   05/02/2011 -  Anti-estrogen oral therapy   letrozole 2.5 mg daily   Follicular 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 ([name])  07/24/2014 Initial Diagnosis   Retroperitoneal mass biopsy left side: Follicular lymphoma low-grade grade 1-2, flow cytometry positive for CD10, 20, 3, 10, 5, BCL-2, BCl-6, CD21, Ki-67 less than 10%   08/13/2014 - 12/06/2016 Chemotherapy   Bendamustine and Rituxan day 1, 2 every 4 weeks 6 cycles followed by Rituxan maintenance every 2 months 2 years completed 12/06/2016   11/03/2014 Imaging   midpoint of chemotherapy: Moderate improvement in the periaortic lymphadenopathy without complete resolution   01/16/2015 PET scan    interval decrease in size and metabolic activity of the large left retroperitoneal mass , no increase in activity, size 10 mm decreased from 48 mm  SUV  1.9 , no additional hypermetabolic activity     CHIEF COMPLIANT:  Follow-up to review scans   INTERVAL HISTORY: Selena [name] is a 86 y.o. with above-mentioned history of breast cancer and follicular lymphoma. She presents to the clinic for a follow-up to review scans. She states that she notices lately that when she eats, she can get multiple small bowel movements.  Overall her health has been pretty good.  She has not had any weight loss or fatigue issues.  ALLERGIES:  is allergic to codeine phosphate, adhesive [tape], and codeine.  MEDICATIONS:  Current Outpatient Medications  Medication Sig Dispense Refill   albuterol (VENTOLIN HFA) 108 (90 Base) MCG/ACT inhaler INHALE 2 PUFFS INTO THE LUNGS EVERY 4 HOURS AS NEEDED FOR WHEEZING OR SHORTNESS OF BREATH 18 g 0   amLODipine (NORVASC) 5 MG tablet Take 1 tablet (5 mg total) by mouth daily. 90 tablet 3   apixaban (ELIQUIS) 5 MG TABS tablet TAKE 1 TABLET(5 MG) BY MOUTH TWICE DAILY 60 tablet 5   Azelastine-Fluticasone 137-50 MCG/ACT SUSP Place 1 puff into the nose daily. 23 g 12   chlorpheniramine (CHLOR-TRIMETON) 4 MG tablet Take 1 tablet (4 mg total) by mouth 2 (two) times daily as needed for allergies. 60 tablet 5   famotidine (PEPCID) 20 MG tablet TAKE 1 TABLET(20 MG) BY MOUTH AT BEDTIME 90 tablet 1   fluticasone (FLOVENT HFA) 110 MCG/ACT inhaler Inhale 2 puffs into the lungs in the morning and at bedtime. 1 each 2   furosemide (LASIX) 20 MG tablet Take 1 tablet (20 mg total) by mouth daily. 5 tablet 0   guaiFENesin-codeine 100-10 MG/5ML syrup TAKE 2.5 TO 5 ML BY MOUTH THREE TIMES DAILY AS  NEEDED FOR COUGH 120 mL 0   metoprolol tartrate (LOPRESSOR) 25 MG tablet Take 1 tablet (25 mg total) by mouth 2 (two) times daily. 180 tablet 3   montelukast (SINGULAIR) 10 MG tablet TAKE 1 TABLET(10 MG) BY MOUTH AT BEDTIME 90 tablet 2   NON FORMULARY CBD oil     pantoprazole (PROTONIX) 40 MG tablet TAKE 1 TABLET(40 MG) BY MOUTH TWICE DAILY 180 tablet  1   traZODone (DESYREL) 50 MG tablet Take 0.5-1 tablets (25-50 mg total) by mouth at bedtime as needed for sleep. 30 tablet 3   No current facility-administered medications for this visit.    PHYSICAL EXAMINATION: ECOG PERFORMANCE STATUS: 1 - Symptomatic but completely ambulatory  Vitals:   01/03/22 0942  BP: 138/72  Pulse: 83  Resp: 18  Temp: (!) 97.3 F (36.3 C)  SpO2: 99%   Filed Weights   01/03/22 0942  Weight: 177 lb 11.2 oz (80.6 kg)      LABORATORY DATA:  I have reviewed the data as listed    Latest Ref Rng & Units 11/30/2021    4:57 PM 10/27/2021    3:19 PM 08/04/2021   12:32 PM  CMP  Glucose 70 - 99 mg/dL 88  91  103   BUN 8 - 27 mg/dL _0 Creatinine 0.57 - 1.00 mg/dL 0.69  0.66  0.72   Sodium 134 - 144 mmol/L 142  143  140   Potassium 3.5 - 5.2 mmol/L 4.4  4.1  4.0   Chloride 96 - 106 mmol/L 101  103  105   CO2 20 - 29 mmol/L _1 Calcium 8.7 - 10.3 mg/dL 9.7  9.3  9.5   Total Protein 6.0 - 8.5 g/dL 6.3     Total Bilirubin 0.0 - 1.2 mg/dL 0.5     Alkaline Phos 44 - 121 IU/L 119     AST 0 - 40 IU/L 15     ALT 0 - 32 IU/L 11       Lab Results  Component Value Date   WBC 11.4 (H) 11/30/2021   HGB 15.7 11/30/2021   HCT 47.7 (H) 11/30/2021   MCV 93 11/30/2021   PLT 247 11/30/2021   NEUTROABS 7.7 (H) 11/30/2021    ASSESSMENT & PLAN:  Follicular 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 ([name]) Retroperitoneal mass biopsy left side 21/22/4825: Follicular lymphoma low-grade grade 1-2, flow cytometry positive for CD10, 20, 3, 10, 5, BCL-2, BCl-6, CD21, Ki-67 less than 10%, stage IB (8X 4 cm mass)   Prior treatment:systemic chemotherapy with bendamustine and Rituxan 6 cycles (  From 08/13/2014 to 01/01/2015) followed by Rituxan maintenance every 2 months for 2 years completed 12/06/2016   CT CAP 12/10/2021: Multiple lymph nodes/soft tissue nodules left common iliac chain and in the sigmoid mesocolon, diffuse subpleural reticulation, 6  mm right upper lobe pulmonary nodule stable  PET/CT 12/31/2021: The multiple lymph nodes/soft tissue nodule seen along the left pelvic sidewall and in the sigmoid mesocolon are hypermetabolic consistent with lymphoma.  Diffuse PET activity in the descending and sigmoid colon with no discrete colon mass evident.  Chronic interstitial lung disease  Relapsed follicular lymphoma: I discussed with the patient that the scan showed that the relapsed lymphoma lymph nodes appear to be fairly small and she is completely asymptomatic and therefore we should continue to watch and monitor.  She would like a second opinion and therefore I requested Dr.  Dorsey to see the patient for consultation.  If Dr. Lorenso Courier agrees then we will plan to do additional CT scans in 3 months and follow-up after that.    No orders of the defined types were placed in this encounter.  The patient has a good understanding of the overall plan. she agrees with it. she will call with any problems that may develop before the next visit here. Total time spent: 30 mins including face to face time and time spent for planning, charting and co-ordination of care   Harriette Ohara, MD 01/03/22    I Gardiner Coins am scribing for Dr. Lindi Adie  I have reviewed the above documentation for accuracy and completeness, and I agree with the above.

## 2021-12-29 ENCOUNTER — Encounter (HOSPITAL_COMMUNITY)
Admission: RE | Admit: 2021-12-29 | Discharge: 2021-12-29 | Disposition: A | Discharge status: Home / Self Care | Payer: Medicare Other | Source: Ambulatory Visit | Attending: Adult Health | Admitting: Adult Health

## 2021-12-29 DIAGNOSIS — R933 Abnormal findings on diagnostic imaging of other parts of digestive tract: Secondary | ICD-10-CM | POA: Diagnosis not present

## 2021-12-29 DIAGNOSIS — R1900 Intra-abdominal and pelvic swelling, mass and lump, unspecified site: Secondary | ICD-10-CM | POA: Insufficient documentation

## 2021-12-29 DIAGNOSIS — I700 Atherosclerosis of aorta: Secondary | ICD-10-CM | POA: Diagnosis not present

## 2021-12-29 DIAGNOSIS — R590 Localized enlarged lymph nodes: Secondary | ICD-10-CM | POA: Diagnosis not present

## 2021-12-29 LAB — GLUCOSE, CAPILLARY: Glucose-Capillary: 92 mg/dL (ref 70–99)

## 2021-12-29 MED ORDER — FLUDEOXYGLUCOSE F - 18 (FDG) INJECTION
8.7600 | Freq: Once | INTRAVENOUS | Status: AC
  Administered 2021-12-29: 8.76 via INTRAVENOUS

## 2022-01-03 ENCOUNTER — Inpatient Hospital Stay: Payer: Medicare Other | Attending: Hematology and Oncology | Admitting: Hematology and Oncology

## 2022-01-03 ENCOUNTER — Other Ambulatory Visit: Payer: Self-pay

## 2022-01-03 VITALS — BP 138/72 | HR 83 | Temp 97.3°F | Resp 18 | Ht 65.0 in | Wt 177.7 lb

## 2022-01-03 DIAGNOSIS — I4891 Unspecified atrial fibrillation: Secondary | ICD-10-CM | POA: Insufficient documentation

## 2022-01-03 DIAGNOSIS — C50411 Malignant neoplasm of upper-outer quadrant of right female breast: Secondary | ICD-10-CM | POA: Diagnosis not present

## 2022-01-03 DIAGNOSIS — Z7951 Long term (current) use of inhaled steroids: Secondary | ICD-10-CM | POA: Insufficient documentation

## 2022-01-03 DIAGNOSIS — Z7901 Long term (current) use of anticoagulants: Secondary | ICD-10-CM | POA: Diagnosis not present

## 2022-01-03 DIAGNOSIS — Z79811 Long term (current) use of aromatase inhibitors: Secondary | ICD-10-CM | POA: Insufficient documentation

## 2022-01-03 DIAGNOSIS — Z79899 Other long term (current) drug therapy: Secondary | ICD-10-CM | POA: Insufficient documentation

## 2022-01-03 DIAGNOSIS — I110 Hypertensive heart disease with heart failure: Secondary | ICD-10-CM | POA: Diagnosis not present

## 2022-01-03 DIAGNOSIS — C8200 Follicular lymphoma grade I, unspecified site: Secondary | ICD-10-CM | POA: Diagnosis not present

## 2022-01-03 DIAGNOSIS — I5032 Chronic diastolic (congestive) heart failure: Secondary | ICD-10-CM | POA: Insufficient documentation

## 2022-01-03 DIAGNOSIS — M1990 Unspecified osteoarthritis, unspecified site: Secondary | ICD-10-CM | POA: Diagnosis not present

## 2022-01-03 DIAGNOSIS — Z9011 Acquired absence of right breast and nipple: Secondary | ICD-10-CM | POA: Insufficient documentation

## 2022-01-03 DIAGNOSIS — C8203 Follicular lymphoma grade I, intra-abdominal lymph nodes: Secondary | ICD-10-CM

## 2022-01-03 DIAGNOSIS — Z170 Estrogen receptor positive status [ER+]: Secondary | ICD-10-CM | POA: Diagnosis not present

## 2022-01-03 NOTE — Assessment & Plan Note (Signed)
Retroperitoneal mass biopsy left side 07/24/2014: Follicular lymphoma low-grade grade 1-2, flow cytometry positive for CD10, 20, 3, 10, 5, BCL-2, BCl-6, CD21, Ki-67 less than 10%, stage IB (8X 4 cm mass)   Prior treatment:systemic chemotherapy with bendamustine and Rituxan 6 cycles (  From 08/13/2014 to 01/01/2015) followed by Rituxan maintenance every 2 months for 2 years completed 12/06/2016   CT CAP 12/10/2021: Multiple lymph nodes/soft tissue nodules left common iliac chain and in the sigmoid mesocolon, diffuse subpleural reticulation, 6 mm right upper lobe pulmonary nodule stable  PET/CT 12/31/2021: The multiple lymph nodes/soft tissue nodule seen along the left pelvic sidewall and in the sigmoid mesocolon are hypermetabolic consistent with lymphoma.  Diffuse PET activity in the descending and sigmoid colon with no discrete colon mass evident.  Chronic interstitial lung disease  Relapsed follicular lymphoma: 

## 2022-01-04 NOTE — Telephone Encounter (Signed)
Patient returns call to nurse line to check status of application. Please advise.   Talbot Grumbling, RN

## 2022-01-05 ENCOUNTER — Telehealth: Payer: Self-pay | Admitting: Hematology and Oncology

## 2022-01-05 NOTE — Telephone Encounter (Signed)
Scheduled appointment per 12/11 los. Patient is aware.

## 2022-01-07 NOTE — Telephone Encounter (Signed)
I placed a signed Med assistance form for Selena [name] in your front office box 12/14..  Is this the form? Sherren Mocha

## 2022-01-11 ENCOUNTER — Inpatient Hospital Stay: Payer: Medicare Other

## 2022-01-11 ENCOUNTER — Other Ambulatory Visit: Payer: Self-pay

## 2022-01-11 ENCOUNTER — Inpatient Hospital Stay: Payer: Medicare Other | Admitting: Hematology and Oncology

## 2022-01-11 VITALS — BP 127/81 | HR 78 | Temp 97.0°F | Resp 15 | Wt 175.2 lb

## 2022-01-11 DIAGNOSIS — C8203 Follicular lymphoma grade I, intra-abdominal lymph nodes: Secondary | ICD-10-CM | POA: Diagnosis not present

## 2022-01-11 DIAGNOSIS — C8200 Follicular lymphoma grade I, unspecified site: Secondary | ICD-10-CM | POA: Diagnosis not present

## 2022-01-11 LAB — CMP (CANCER CENTER ONLY)
ALT: 16 U/L (ref 0–44)
AST: 18 U/L (ref 15–41)
Albumin: 3.8 g/dL (ref 3.5–5.0)
Alkaline Phosphatase: 83 U/L (ref 38–126)
Anion gap: 8 (ref 5–15)
BUN: 18 mg/dL (ref 8–23)
CO2: 28 mmol/L (ref 22–32)
Calcium: 9.5 mg/dL (ref 8.9–10.3)
Chloride: 105 mmol/L (ref 98–111)
Creatinine: 0.74 mg/dL (ref 0.44–1.00)
GFR, Estimated: >60 mL/min (ref >60)
Glucose, Bld: 96 mg/dL (ref 70–99)
Potassium: 5.2 mmol/L — ABNORMAL HIGH (ref 3.5–5.1)
Sodium: 141 mmol/L (ref 135–145)
Total Bilirubin: 0.6 mg/dL (ref 0.3–1.2)
Total Protein: 6.4 g/dL — ABNORMAL LOW (ref 6.5–8.1)

## 2022-01-11 LAB — CBC WITH DIFFERENTIAL (CANCER CENTER ONLY)
Abs Immature Granulocytes: 0.04 10*3/uL (ref 0.00–0.07)
Basophils Absolute: 0.1 10*3/uL (ref 0.0–0.1)
Basophils Relative: 1 %
Eosinophils Absolute: 0.4 10*3/uL (ref 0.0–0.5)
Eosinophils Relative: 4 %
HCT: 46.7 % — ABNORMAL HIGH (ref 36.0–46.0)
Hemoglobin: 15.7 g/dL — ABNORMAL HIGH (ref 12.0–15.0)
Immature Granulocytes: 0 %
Lymphocytes Relative: 15 %
Lymphs Abs: 1.5 10*3/uL (ref 0.7–4.0)
MCH: 31.3 pg (ref 26.0–34.0)
MCHC: 33.6 g/dL (ref 30.0–36.0)
MCV: 93.2 fL (ref 80.0–100.0)
Monocytes Absolute: 0.9 10*3/uL (ref 0.1–1.0)
Monocytes Relative: 10 %
Neutro Abs: 7 10*3/uL (ref 1.7–7.7)
Neutrophils Relative %: 70 %
Platelet Count: 194 10*3/uL (ref 150–400)
RBC: 5.01 MIL/uL (ref 3.87–5.11)
RDW: 14.3 % (ref 11.5–15.5)
WBC Count: 9.9 10*3/uL (ref 4.0–10.5)
nRBC: 0 % (ref 0.0–0.2)

## 2022-01-11 LAB — LACTATE DEHYDROGENASE: LDH: 186 U/L (ref 98–192)

## 2022-01-11 NOTE — Progress Notes (Signed)
[place] [phone]   Fax:([phone]) [phone]  PROGRESS NOTE  Patient Care Team: McDiarmid, Blane Ohara, MD as PCP - General (Family Medicine) Burnell Blanks, MD as PCP - Cardiology (Cardiology) Laurin Coder, MD as Consulting Physician (Pulmonary Disease) Mcarthur Rossetti, MD as Consulting Physician (Orthopedic Surgery) Nicholas Lose, MD as Consulting Physician (Hematology and Oncology) [facility], Melanie Crazier, MD as Consulting Physician (Endocrinology) Noralyn Pick, NP as Nurse Practitioner (Gastroenterology) Evans Lance, MD as Consulting Physician (Cardiology) Rolm Bookbinder, MD as Consulting Physician (General Surgery)  Hematological/Oncological History # Relapsed Follicular Lymphoma 16/07/3708: presents to Dr. Lorenso Courier for a 2nd opinion.   Interval History:  Selena [name] 86 y.o. female with medical history significant for breast cancer s/p masatectomy and subsequent letrozole therapy and follicular lymphomas s/p BR in 2016 who presents for a follow up visit. The patient's last visit was on 01/03/2022 with Dr. Lindi Adie. In the interim since the last visit Selena [name] has requested a second opinion regarding treatment of her follicular lymphoma.  On exam today Selena [name] is accompanied by her daughter and son.  She reports that she feels good overall.  She notes that she does have some occasional night sweats but not quite as heavy as they were when she was initially diagnosed with follicular lymphoma.  She is not having any trouble with nausea, vomiting, or diarrhea.  She reports no fevers, chills, sweats.  She notes her weight is typically stable in the 170s.  She notes her appetite can be somewhat poor and she always tries to do her best to eat something for supper to take with her medications.  She reports her energy levels have been good and overall she is at her baseline level of health.  She denies any B symptoms.   A full 10 point ROS was otherwise negative.  The bulk of our discussion focused on the diagnosis of follicular lymphoma and the options moving forward.  Details of this conversation are noted below.  MEDICAL HISTORY:  Past Medical History:  Diagnosis Date   Acute gastric ulcer with hemorrhage    Acute on chronic diastolic congestive heart failure ([place]) 10/16/2018   AKI (acute kidney injury) ([place])    Anemia    PMH   Aneurysm (arteriovenous) of coronary vessels    Ankle edema, bilateral 01/08/2021   Atrial fibrillation ([place]) 01/01/2007   BACK PAIN, UPPER 07/09/2008   Breast cancer of upper-outer quadrant of right female breast ([place]) 03/21/2011   Breast cancer, stage 1 ([place])    Bruises easily    Cataract    history   Complication of anesthesia    COVID-19 virus infection 03/08/2020   Diverticulitis    DIVERTICULOSIS, COLON 12/26/2006   Essential hypertension 62/69/4854   Follicular 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 ([place]) 62/70/3500   Follicular non-Hodgkin's lymphoma ([name])    Frozen shoulder, Left 01/08/2021   Full dentures 01/08/2021   Goiter    multi-nodular   Gustatory rhinitis 01/07/2021   Hemorrhoids 04/08/2021   History of atrioventricular nodal ablation 10/16/2018   Hx of colonoscopy 2009   Incontinence of feces 04/08/2021   Influenza 12/18/2020   Insomnia 09/10/2014   LIVER FUNCTION TESTS, ABNORMAL, HX OF 12/26/2007   Macular degeneration, bilateral 01/08/2021   Memory loss 05/16/2007   Mild cognitive impairment 09/23/2021   Qualifier: Diagnosis of  By: Burnice Logan  MD, Doretha Sou    Multiple gastric ulcers 2021   CC Hematemesis; EGD showed mild  abnormalities in the esophagus and multiple gastric ulcers   Nocturnal hypoxemia 05/14/2020   OSTEOARTHRITIS 12/26/2006   takes Diclofenac daily but has stopped for surgery   Osteoarthritis 12/26/2006   Qualifier: Diagnosis of  By: Paulina Fusi, RN, Daine Gravel    Paraspinal mass 07/16/2014   Pneumonia 11/11/2017    Pneumonia 01/31/2018   Pneumonia due to COVID-19 virus 03/08/2020   PONV (postoperative nausea and vomiting)    S/P ablation of atrial flutter 07/17/2018   S/P ablation of atrial flutter 07/17/2018   Sensorineural hearing loss (SNHL) of both ears 01/07/2021   Sepsis ([place]) 04/02/2020   Shortness of breath 05/28/2019   Skin cancer    Syncope 08/21/2018   Thoracic aortic aneurysm ([place]) 10/16/2018   Tibialis posterior tendinopathy 04/08/2019   Upper airway cough syndrome 10/27/2017   pfts 10/10/17 s sign airflow obst though the insp loop is somewhat flattened  - Allergy profile 10/26/2017 >  Eos 0.3 /  IgE  6 RAST neg - rx for gerd 10/26/17 > improved 12/07/2017    Vasomotor rhinitis 01/08/2021   VERTIGO 09/07/2009   Has had Vestibular Rehab consultation   Visual impairment 01/07/2021   Wears dentures    Wears glasses    Wears hearing aid    bilateral    SURGICAL HISTORY: Past Surgical History:  Procedure Laterality Date   ABDOMINAL HYSTERECTOMY     ABLATION OF DYSRHYTHMIC FOCUS  2009   APPENDECTOMY     AUGMENTATION MAMMAPLASTY Right 2013   Subpectoral implant   BIOPSY  05/07/2019   Procedure: BIOPSY;  Surgeon: Irving Copas., MD;  Location: [facility];  Service: Gastroenterology;;   BREAST BIOPSY  2013   BREAST RECONSTRUCTION  04/14/2011   Procedure: BREAST RECONSTRUCTION;  Surgeon: Crissie Reese, MD;  Location: [facility];  Service: Plastics;  Laterality: Right;  Placement of Right Breast Tissue Expander with  use of Flex HD for Breast Reconstruction   BREAST SURGERY     right sm, snbx   CATARACT EXTRACTION W/ INTRAOCULAR LENS  IMPLANT, BILATERAL  2010   bilateral   COLONOSCOPY     COLONOSCOPY     ESOPHAGOGASTRODUODENOSCOPY (EGD) WITH PROPOFOL N/A 05/07/2019   Procedure: ESOPHAGOGASTRODUODENOSCOPY (EGD) WITH PROPOFOL;  Surgeon: Irving Copas., MD;  Location: [facility];  Service: Gastroenterology;  Laterality: N/A;   KNEE SURGERY  2004/2010   arthroscopic/  bilateral knee replacements   LOOP RECORDER IMPLANT     MASTECTOMY Right 2013   PORT-A-CATH REMOVAL N/A 12/07/2016   Procedure: REMOVAL PORT-A-CATH;  Surgeon: Rolm Bookbinder, MD;  Location: [facility];  Service: General;  Laterality: N/A;   PORTACATH PLACEMENT Right 08/11/2014   Procedure: INSERTION PORT-A-CATH WITH ULTRASOUND;  Surgeon: Rolm Bookbinder, MD;  Location: [facility];  Service: General;  Laterality: Right;   [place]   right   tmi  1998   TONSILLECTOMY     TOTAL KNEE ARTHROPLASTY Bilateral     SOCIAL HISTORY: Social History   Socioeconomic History   Marital status: Widowed    Spouse name: Not on file   Number of children: 2   Years of education: 7   Highest education level: High school graduate  Occupational History   Occupation: retired  Tobacco Use   Smoking status: Former    Packs/day: 0.10    Years: 20.00    Total pack years: 2.00    Types: Cigarettes    Quit date: 01/24/1978    Years since quitting: 84.9  Passive exposure: Past   Smokeless tobacco: Never  Vaping Use   Vaping Use: Never used  Substance and Sexual Activity   Alcohol use: No   Drug use: No   Sexual activity: Not Currently    Birth control/protection: Surgical  Other Topics Concern   Not on file  Social History Narrative   Mrs Mcknight's two children live beside her, son and daughter.   She worked at [employer]   She has a walker, cane, BSC, and lift chair at home.   Social Determinants of Health   Financial Resource Strain: Low Risk  (11/24/2021)   Overall Financial Resource Strain (CARDIA)    Difficulty of Paying Living Expenses: Not hard at all  Food Insecurity: No Food Insecurity (11/24/2021)   Hunger Vital Sign    Worried About Running Out of Food in the Last Year: Never true    Ran Out of Food in the Last Year: Never true  Transportation Needs: No Transportation Needs (11/24/2021)   PRAPARE - Hydrologist (Medical): No    Lack of  Transportation (Non-Medical): No  Physical Activity: Insufficiently Active (11/24/2021)   Exercise Vital Sign    Days of Exercise per Week: 7 days    Minutes of Exercise per Session: 20 min  Stress: No Stress Concern Present (11/24/2021)   [place]    Feeling of Stress : Not at all  Social Connections: Moderately Integrated (11/24/2021)   Social Connection and Isolation Panel [NHANES]    Frequency of Communication with Friends and Family: More than three times a week    Frequency of Social Gatherings with Friends and Family: More than three times a week    Attends Religious Services: More than 4 times per year    Active Member of [employer] or Organizations: Yes    Attends Archivist Meetings: More than 4 times per year    Marital Status: Widowed  Intimate Partner Violence: Not At Risk (11/24/2021)   Humiliation, Afraid, Rape, and Kick questionnaire    Fear of Current or Ex-Partner: No    Emotionally Abused: No    Physically Abused: No    Sexually Abused: No    FAMILY HISTORY: Family History  Problem Relation Age of Onset   Colon cancer Mother    Cancer Mother        colon   Colon cancer Maternal Aunt    Cancer Maternal Uncle    Prostate cancer Maternal Uncle    Prostate cancer Maternal Uncle    Other Maternal Uncle    Cancer Sister        kidney   Cancer Brother        lymph node   Anesthesia problems Neg Hx    Hypotension Neg Hx    Malignant hyperthermia Neg Hx    Pseudochol deficiency Neg Hx    Breast cancer Neg Hx     ALLERGIES:  is allergic to codeine phosphate, adhesive [tape], and codeine.  MEDICATIONS:  Current Outpatient Medications  Medication Sig Dispense Refill   albuterol (VENTOLIN HFA) 108 (90 Base) MCG/ACT inhaler INHALE 2 PUFFS INTO THE LUNGS EVERY 4 HOURS AS NEEDED FOR WHEEZING OR SHORTNESS OF BREATH 18 g 0   amLODipine (NORVASC) 5 MG tablet Take 1 tablet (5 mg total) by mouth  daily. 90 tablet 3   apixaban (ELIQUIS) 5 MG TABS tablet TAKE 1 TABLET(5 MG) BY MOUTH TWICE DAILY 60 tablet 5  Azelastine-Fluticasone 137-50 MCG/ACT SUSP Place 1 puff into the nose daily. 23 g 12   chlorpheniramine (CHLOR-TRIMETON) 4 MG tablet Take 1 tablet (4 mg total) by mouth 2 (two) times daily as needed for allergies. 60 tablet 5   famotidine (PEPCID) 20 MG tablet TAKE 1 TABLET(20 MG) BY MOUTH AT BEDTIME 90 tablet 1   fluticasone (FLOVENT HFA) 110 MCG/ACT inhaler Inhale 2 puffs into the lungs in the morning and at bedtime. 1 each 2   furosemide (LASIX) 20 MG tablet Take 1 tablet (20 mg total) by mouth daily. 5 tablet 0   guaiFENesin-codeine 100-10 MG/5ML syrup TAKE 2.5 TO 5 ML BY MOUTH THREE TIMES DAILY AS NEEDED FOR COUGH 120 mL 0   metoprolol tartrate (LOPRESSOR) 25 MG tablet Take 1 tablet (25 mg total) by mouth 2 (two) times daily. 180 tablet 3   montelukast (SINGULAIR) 10 MG tablet TAKE 1 TABLET(10 MG) BY MOUTH AT BEDTIME 90 tablet 2   NON FORMULARY CBD oil     pantoprazole (PROTONIX) 40 MG tablet TAKE 1 TABLET(40 MG) BY MOUTH TWICE DAILY 180 tablet 1   traZODone (DESYREL) 50 MG tablet Take 0.5-1 tablets (25-50 mg total) by mouth at bedtime as needed for sleep. 30 tablet 3   No current facility-administered medications for this visit.    REVIEW OF SYSTEMS:   Constitutional: ( - ) fevers, ( - )  chills , ( - ) night sweats Eyes: ( - ) blurriness of vision, ( - ) double vision, ( - ) watery eyes Ears, nose, mouth, throat, and face: ( - ) mucositis, ( - ) sore throat Respiratory: ( - ) cough, ( - ) dyspnea, ( - ) wheezes Cardiovascular: ( - ) palpitation, ( - ) chest discomfort, ( - ) lower extremity swelling Gastrointestinal:  ( - ) nausea, ( - ) heartburn, ( - ) change in bowel habits Skin: ( - ) abnormal skin rashes Lymphatics: ( - ) new lymphadenopathy, ( - ) easy bruising Neurological: ( - ) numbness, ( - ) tingling, ( - ) new weaknesses Behavioral/Psych: ( - ) mood change, ( -  ) new changes  All other systems were reviewed with the patient and are negative.  PHYSICAL EXAMINATION: ECOG PERFORMANCE STATUS: 0 - Asymptomatic  Vitals:   01/11/22 1159  BP: 127/81  Pulse: 78  Resp: 15  Temp: (!) 97 F (36.1 C)  SpO2: 98%   Filed Weights   01/11/22 1159  Weight: 175 lb 3.2 oz (79.5 kg)    GENERAL: Well-appearing elderly Caucasian female, alert, no distress and comfortable SKIN: skin color, texture, turgor are normal, no rashes or significant lesions EYES: conjunctiva are pink and non-injected, sclera clear NECK: supple, non-tender LYMPH:  no palpable lymphadenopathy in the cervical, axillary or inguinal LUNGS: clear to auscultation and percussion with normal breathing effort HEART: regular rate & rhythm and no murmurs and no lower extremity edema Musculoskeletal: no cyanosis of digits and no clubbing  PSYCH: alert & oriented x 3, fluent speech NEURO: no focal motor/sensory deficits  LABORATORY DATA:  I have reviewed the data as listed    Latest Ref Rng & Units 01/11/2022    1:22 PM 11/30/2021    4:57 PM 11/30/2021    4:55 PM  CBC  WBC 4.0 - 10.5 K/uL 9.9  11.4  11.2   Hemoglobin 12.0 - 15.0 g/dL 15.7  15.7  15.7   Hematocrit 36.0 - 46.0 % 46.7  47.7  47.6   Platelets  150 - 400 K/uL 194   247        Latest Ref Rng & Units 01/11/2022    1:22 PM 11/30/2021    4:57 PM 10/27/2021    3:19 PM  CMP  Glucose 70 - 99 mg/dL 96  88  91   BUN 8 - 23 mg/dL '18  17  12   '$ Creatinine 0.44 - 1.00 mg/dL 0.74  0.69  0.66   Sodium 135 - 145 mmol/L 141  142  143   Potassium 3.5 - 5.1 mmol/L 5.2  4.4  4.1   Chloride 98 - 111 mmol/L 105  101  103   CO2 22 - 32 mmol/L '28  22  23   '$ Calcium 8.9 - 10.3 mg/dL 9.5  9.7  9.3   Total Protein 6.5 - 8.1 g/dL 6.4  6.3    Total Bilirubin 0.3 - 1.2 mg/dL 0.6  0.5    Alkaline Phos 38 - 126 U/L 83  119    AST 15 - 41 U/L 18  15    ALT 0 - 44 U/L 16  11      RADIOGRAPHIC STUDIES: I have personally reviewed the radiological  images as listed and agreed with the findings in the report: Small cluster of intra-abdominal lymph nodes that are FDG avid, otherwise no evidence of involvement elsewhere. NM PET Image Restag (PS) Skull Base To Thigh  Result Date: 12/31/2021 CLINICAL DATA:  Initial treatment strategy for lymphadenopathy in the pelvis. Metastatic disease versus lymphoma. History of breast cancer. EXAM: NUCLEAR MEDICINE PET SKULL BASE TO THIGH TECHNIQUE: 8.8 mCi F-18 FDG was injected intravenously. Full-ring PET imaging was performed from the skull base to thigh after the radiotracer. CT data was obtained and used for attenuation correction and anatomic localization. Fasting blood glucose: 92 mg/dl COMPARISON:  Chest abdomen pelvis CT 12/09/2021 FINDINGS: Mediastinal blood pool activity: SUV max 2.4 Liver activity: SUV max 3.0 NECK: No hypermetabolic lymph nodes in the neck. Relatively symmetric uptake identified in the posterior nasopharynx and oropharynx. Incidental CT findings: 3.1 cm left thyroid nodule. This has been evaluated on previous imaging. (ref: J Am Coll Radiol. 2015 Feb;12(2): 143-50). CHEST: No hypermetabolic mediastinal or hilar nodes. No suspicious pulmonary nodules on the CT scan. Incidental CT findings: The heart is enlarged. No substantial pericardial effusion. Coronary artery calcification is evident. Mild atherosclerotic calcification is noted in the wall of the thoracic aorta. Underlying chronic interstitial lung disease again noted, similar to 12/09/2021. ABDOMEN/PELVIS: No abnormal hypermetabolic activity within the liver, pancreas, adrenal glands, or spleen. No hypermetabolic lymph nodes in the abdomen. Lymphadenopathy seen previously in the left pelvis is hypermetabolic on PET imaging. 12 mm short axis left pelvic node on 147/4 demonstrates SUV max = 14.4. 6 mm lymph node in the sigmoid mesocolon (786/7) is hypermetabolic with SUV max = 6.5. 1.7 x 1.1 cm soft tissue lesion in the sigmoid mesocolon on  161/4 demonstrates SUV max = 7.2. 7 mm left perirectal node on 167/4 demonstrates SUV max = 2.8. Relatively diffuse FDG accumulation is seen in the left colon including the sigmoid segment demonstrating advanced diverticular disease. Incidental CT findings: Tiny hypoattenuating lesion inferior right liver is stable with no hypermetabolism on PET imaging. There is moderate atherosclerotic calcification of the abdominal aorta without aneurysm. SKELETON: No focal hypermetabolic activity to suggest skeletal metastasis. Incidental CT findings: No worrisome lytic or sclerotic osseous abnormality. IMPRESSION: 1. The multiple lymph nodes/soft tissue nodule seen along the left pelvic sidewall and in  the sigmoid mesocolon previously are hypermetabolic. Imaging features are compatible with metastatic disease or lymphoma. There is relatively diffuse hypermetabolic FDG accumulation in the descending and sigmoid colon with no discrete hypermetabolic colon mass evident. No other hypermetabolic lesion identified to suggest primary neoplasm. 2. No evidence for hypermetabolic disease in the neck, chest, or abdomen. 3. Stable appearance of chronic interstitial lung disease. 4. Cardiomegaly. 5.  Aortic Atherosclerosis (ICD10-I70.0). Electronically Signed   By: Misty Stanley M.D.   On: 12/31/2021 11:03    ASSESSMENT & PLAN Selena [name] 86 y.o. female with medical history significant for breast cancer s/p masatectomy and subsequent letrozole therapy and follicular lymphomas s/p BR in 2016 who presents for a follow up visit.  #Follicular Lymphoma -- Patient has a history of follicular lymphoma previously treated with BR in 2016 followed by every 2 month rituximab for 2 years, ending in November 2018. -- Patient had recent CT imaging on 12/09/2021 which showed intra-abdominal lymphadenopathy.  This was further characterized by PET CT scan on 12/29/2021. -- Given the distribution of the lymph nodes and location findings are  most consistent with recurrence of her follicular lymphoma.  No clear evidence of another primary. -- Lymph nodes are quite small and difficult location, repeat biopsy does not appear feasible.  Also given the small size of lymph nodes do not think surgical excision is required at this time. -- Recommend repeat imaging of the lymph nodes in 3 to 6 months time in order to assure stability. -- Today will order CBC, CMP, LDH, and flow cytometry -- Recommend the patient continue to follow with Dr. Lindi Adie  In the event she were to develop worsening follicular lymphoma that requires treatment we are happy to see her back.  Patient voiced understanding and appreciation for the second opinion.  #Hx of Breast Cancer -- Continue per Dr. Lindi Adie  Orders Placed This Encounter  Procedures   CBC with Differential ([place] Only)    Standing Status:   Future    Number of Occurrences:   1    Standing Expiration Date:   01/12/2023   CMP ([place] only)    Standing Status:   Future    Number of Occurrences:   1    Standing Expiration Date:   01/12/2023   Lactate dehydrogenase (LDH)    Standing Status:   Future    Number of Occurrences:   1    Standing Expiration Date:   01/11/2023   Flow Cytometry, Peripheral Blood (Oncology)    Standing Status:   Future    Number of Occurrences:   1    Standing Expiration Date:   01/12/2023    All questions were answered. The patient knows to call the clinic with any problems, questions or concerns.  A total of more than 40 minutes were spent on this encounter with face-to-face time and non-face-to-face time, including preparing to see the patient, ordering tests and/or medications, counseling the patient and coordination of care as outlined above.   Ledell Peoples, MD Department of Hematology/Oncology [place] at [facility] Phone: [phone] Pager: [phone] Email: [email or web address]  01/11/2022 3:34 PM

## 2022-01-11 NOTE — Telephone Encounter (Signed)
Submitted RE-ENROLLMENT application for ELIQUIS to [employer]) for patient assistance.   Phone: [phone]

## 2022-01-12 LAB — SURGICAL PATHOLOGY

## 2022-01-13 LAB — FLOW CYTOMETRY

## 2022-01-24 NOTE — Progress Notes (Signed)
Cardiology Office Note:    Date:  01/28/2022   ID:  Selena [name], DOB April 02, 1935, MRN [id number]  PCP:  McDiarmid, Blane Ohara, MD   [facility] Providers Cardiologist:  Lauree Chandler, MD     Referring MD: McDiarmid, Blane Ohara, MD   Chief Complaint: chronic HFpEF  History of Present Illness:    Selena [name] is a very pleasant  87 y.o. female with a hx of persistent atrial fibrillation on chronic anticoagulation, OSA on CPAP, HTN, breast cancer, non-Hodgkin's lymphoma, ILD, and thoracic aortic aneurysm  Remote atrial flutter in 2008 and had ablation.  She had breast cancer March 2013 treated with right mastectomy and chemotherapy.  She was seen in the [facility] ED August 2019 with c/o dyspnea for 2 months.  D-dimer was elevated.  Chest CTA negative for PE. There was a 4.2 cm thoracic aortic aneurysm with no evidence of dissection. No underlying lung disease.  Echo August 2019 with mild LVH, normal LV systolic function with EF 55 to 00%, grade 2 diastolic dysfunction.  Cardiac monitor February 2020 with PACs, PVCs, and SVT.  She had a syncopal event in June 2020.  Seen by Dr. Lovena Le and had loop recorder placed August 2020.  She was found to have atrial fibrillation and was started on Eliquis.  She had ILR turned off due to cost of follow-up.  She had bright red emesis in 2021 and was admitted with a EGD that showed mild abnormalities in the esophagus and multiple gastric ulcers.  She was started on PPI.  Chronic DOE. 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   Last cardiology clinic visit was 10/27/21 with Robbie Lis, PA for SOB. Had been seen by PCP 10/19/21 for viral illness and started on prednisone. CXR with multifocal infection.  Reported she was feeling better while  taking prednisone 5 days but since completing course, she had worsening breathing. Reported chronic cough, now with yellow phlegm. Some chest discomfort with cough, feeling heart "flip-flopping." No orthopnea, PND or syncope. BNP elevated to 1278 and she was advised to start furosemide 20 mg. TTE 11/08/21 revealed LVEF 55-60%, no rwma, unable to evaluate diastolic function, normal RV, severely dilated LA.  Today, she is here with her brother.  She has visual problems and no longer drives. Reports she is feeling great and was not sure why she was coming in today.  She did not have clear recollection of clinic visit on 10/27/21 but is happy to report that she has been feeling well since that time.  Reports she was told that she has scarring in her lungs from pneumonia and COVID and she will continue to have a chronic cough. Starts out each night with CPAP but sometimes coughing causes her to have to remove it during the night. Occasional dizziness that she associates with vertigo. Has a granddaughter who is a physician who is able to relieve her symptoms of vertigo with manipulation. Home BP 125-130 over 70s. Has chronic DOE and bilateral LE edema that she feels is stable. She denies chest pain, fatigue, palpitations, melena, hematuria, hemoptysis, diaphoresis, weakness, presyncope, syncope, orthopnea, and PND.  Recently found out that breast cancer has returned.  Past Medical History:  Diagnosis Date   Acute gastric ulcer with hemorrhage    Acute on chronic diastolic congestive heart  failure ([place]) 10/16/2018   AKI (acute kidney injury) ([place])    Anemia    PMH   Aneurysm (arteriovenous) of coronary vessels    Ankle edema, bilateral 01/08/2021   Atrial fibrillation ([place]) 01/01/2007   BACK PAIN, UPPER 07/09/2008   Breast cancer of upper-outer quadrant of right female breast ([place]) 03/21/2011   Breast cancer, stage 1 ([place])    Bruises easily    Cataract    history   Complication of anesthesia    COVID-19  virus infection 03/08/2020   Diverticulitis    DIVERTICULOSIS, COLON 12/26/2006   Essential hypertension 85/46/2703   Follicular 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 ([place]) 50/09/3816   Follicular non-Hodgkin's lymphoma ([name])    Frozen shoulder, Left 01/08/2021   Full dentures 01/08/2021   Goiter    multi-nodular   Gustatory rhinitis 01/07/2021   Hemorrhoids 04/08/2021   History of atrioventricular nodal ablation 10/16/2018   Hx of colonoscopy 2009   Incontinence of feces 04/08/2021   Influenza 12/18/2020   Insomnia 09/10/2014   LIVER FUNCTION TESTS, ABNORMAL, HX OF 12/26/2007   Macular degeneration, bilateral 01/08/2021   Memory loss 05/16/2007   Mild cognitive impairment 09/23/2021   Qualifier: Diagnosis of  By: Burnice Logan  MD, Doretha Sou    Multiple gastric ulcers 2021   CC Hematemesis; EGD showed mild abnormalities in the esophagus and multiple gastric ulcers   Nocturnal hypoxemia 05/14/2020   OSTEOARTHRITIS 12/26/2006   takes Diclofenac daily but has stopped for surgery   Osteoarthritis 12/26/2006   Qualifier: Diagnosis of  By: Paulina Fusi, RN, Judy Lelon Frohlich    Paraspinal mass 07/16/2014   Pneumonia 11/11/2017   Pneumonia 01/31/2018   Pneumonia due to COVID-19 virus 03/08/2020   PONV (postoperative nausea and vomiting)    S/P ablation of atrial flutter 07/17/2018   S/P ablation of atrial flutter 07/17/2018   Sensorineural hearing loss (SNHL) of both ears 01/07/2021   Sepsis ([place]) 04/02/2020   Shortness of breath 05/28/2019   Skin cancer    Syncope 08/21/2018   Thoracic aortic aneurysm ([place]) 10/16/2018   Tibialis posterior tendinopathy 04/08/2019   Upper airway cough syndrome 10/27/2017   pfts 10/10/17 s sign airflow obst though the insp loop is somewhat flattened  - Allergy profile 10/26/2017 >  Eos 0.3 /  IgE  6 RAST neg - rx for gerd 10/26/17 > improved 12/07/2017    Vasomotor rhinitis 01/08/2021   VERTIGO 09/07/2009   Has had Vestibular Rehab consultation   Visual  impairment 01/07/2021   Wears dentures    Wears glasses    Wears hearing aid    bilateral    Past Surgical History:  Procedure Laterality Date   ABDOMINAL HYSTERECTOMY     ABLATION OF DYSRHYTHMIC FOCUS  2009   APPENDECTOMY     AUGMENTATION MAMMAPLASTY Right 2013   Subpectoral implant   BIOPSY  05/07/2019   Procedure: BIOPSY;  Surgeon: Irving Copas., MD;  Location: [facility];  Service: Gastroenterology;;   BREAST BIOPSY  2013   BREAST RECONSTRUCTION  04/14/2011   Procedure: BREAST RECONSTRUCTION;  Surgeon: Crissie Reese, MD;  Location: [facility];  Service: Plastics;  Laterality: Right;  Placement of Right Breast Tissue Expander with  use of Flex HD for Breast Reconstruction   BREAST SURGERY     right sm, snbx   CATARACT EXTRACTION W/ INTRAOCULAR LENS  IMPLANT, BILATERAL  2010   bilateral   COLONOSCOPY     COLONOSCOPY     ESOPHAGOGASTRODUODENOSCOPY (EGD) WITH PROPOFOL  N/A 05/07/2019   Procedure: ESOPHAGOGASTRODUODENOSCOPY (EGD) WITH PROPOFOL;  Surgeon: Rush Landmark Telford Nab., MD;  Location: [facility];  Service: Gastroenterology;  Laterality: N/A;   KNEE SURGERY  2004/2010   arthroscopic/ bilateral knee replacements   LOOP RECORDER IMPLANT     MASTECTOMY Right 2013   PORT-A-CATH REMOVAL N/A 12/07/2016   Procedure: REMOVAL PORT-A-CATH;  Surgeon: Rolm Bookbinder, MD;  Location: [facility];  Service: General;  Laterality: N/A;   PORTACATH PLACEMENT Right 08/11/2014   Procedure: INSERTION PORT-A-CATH WITH ULTRASOUND;  Surgeon: Rolm Bookbinder, MD;  Location: [facility];  Service: General;  Laterality: Right;   [place]   right   tmi  1998   TONSILLECTOMY     TOTAL KNEE ARTHROPLASTY Bilateral     Current Medications: Current Meds  Medication Sig   albuterol (VENTOLIN HFA) 108 (90 Base) MCG/ACT inhaler INHALE 2 PUFFS INTO THE LUNGS EVERY 4 HOURS AS NEEDED FOR WHEEZING OR SHORTNESS OF BREATH   amLODipine (NORVASC) 5 MG tablet Take 1 tablet (5 mg total) by  mouth daily.   apixaban (ELIQUIS) 5 MG TABS tablet TAKE 1 TABLET(5 MG) BY MOUTH TWICE DAILY   Azelastine-Fluticasone 137-50 MCG/ACT SUSP Place 1 puff into the nose daily.   chlorpheniramine (CHLOR-TRIMETON) 4 MG tablet Take 1 tablet (4 mg total) by mouth 2 (two) times daily as needed for allergies.   famotidine (PEPCID) 20 MG tablet TAKE 1 TABLET(20 MG) BY MOUTH AT BEDTIME   guaiFENesin-codeine 100-10 MG/5ML syrup TAKE 2.5 TO 5 ML BY MOUTH THREE TIMES DAILY AS NEEDED FOR COUGH   metoprolol tartrate (LOPRESSOR) 25 MG tablet Take 1 tablet (25 mg total) by mouth 2 (two) times daily.   montelukast (SINGULAIR) 10 MG tablet TAKE 1 TABLET(10 MG) BY MOUTH AT BEDTIME     Allergies:   Codeine phosphate, Adhesive [tape], and Codeine   Social History   Socioeconomic History   Marital status: Widowed    Spouse name: Not on file   Number of children: 2   Years of education: 62   Highest education level: High school graduate  Occupational History   Occupation: retired  Tobacco Use   Smoking status: Former    Packs/day: 0.10    Years: 20.00    Total pack years: 2.00    Types: Cigarettes    Quit date: 01/24/1978    Years since quitting: 44.0    Passive exposure: Past   Smokeless tobacco: Never  Vaping Use   Vaping Use: Never used  Substance and Sexual Activity   Alcohol use: No   Drug use: No   Sexual activity: Not Currently    Birth control/protection: Surgical  Other Topics Concern   Not on file  Social History Narrative   Mrs Morren's two children live beside her, son and daughter.   She worked at [employer]   She has a walker, cane, BSC, and lift chair at home.   Social Determinants of Health   Financial Resource Strain: Low Risk  (11/24/2021)   Overall Financial Resource Strain (CARDIA)    Difficulty of Paying Living Expenses: Not hard at all  Food Insecurity: No Food Insecurity (11/24/2021)   Hunger Vital Sign    Worried About Running Out of Food in the Last Year: Never true     Ran Out of Food in the Last Year: Never true  Transportation Needs: No Transportation Needs (11/24/2021)   PRAPARE - Hydrologist (Medical): No    Lack  of Transportation (Non-Medical): No  Physical Activity: Insufficiently Active (11/24/2021)   Exercise Vital Sign    Days of Exercise per Week: 7 days    Minutes of Exercise per Session: 20 min  Stress: No Stress Concern Present (11/24/2021)   [place]    Feeling of Stress : Not at all  Social Connections: Moderately Integrated (11/24/2021)   Social Connection and Isolation Panel [NHANES]    Frequency of Communication with Friends and Family: More than three times a week    Frequency of Social Gatherings with Friends and Family: More than three times a week    Attends Religious Services: More than 4 times per year    Active Member of [employer] or Organizations: Yes    Attends Archivist Meetings: More than 4 times per year    Marital Status: Widowed     Family History: The patient's family history includes Cancer in her brother, maternal uncle, mother, and sister; Colon cancer in her maternal aunt and mother; Other in her maternal uncle; Prostate cancer in her maternal uncle and maternal uncle. There is no history of Anesthesia problems, Hypotension, Malignant hyperthermia, Pseudochol deficiency, or Breast cancer.  ROS:   Please see the history of present illness.    + chronic cough + chronic DOE + chronic bilateral LE edema All other systems reviewed and are negative.  Labs/Other Studies Reviewed:    The following studies were reviewed today:  Echo 11/08/21 1. Left ventricular ejection fraction, by estimation, is 55 to 60%. The  left ventricle has normal function. The left ventricle has no regional  wall motion abnormalities. Left ventricular diastolic function could not  be evaluated.   2. Right ventricular systolic function is  normal. The right ventricular  size is normal.   3. Left atrial size was severely dilated.   4. Right atrial size was mildly dilated.   5. The mitral valve is normal in structure. Trivial mitral valve  regurgitation. No evidence of mitral stenosis.   6. The aortic valve is tricuspid. Aortic valve regurgitation is trivial.  Aortic valve sclerosis is present, with no evidence of aortic valve  stenosis.   7. The inferior vena cava is normal in size with greater than 50%  respiratory variability, suggesting right atrial pressure of 3 mmHg.  Recent Labs: 10/27/2021: NT-Pro BNP 1,278 11/30/2021: TSH 0.563 01/11/2022: ALT 16; BUN 18; Creatinine 0.74; Hemoglobin 15.7; Platelet Count 194; Potassium 5.2; Sodium 141  Recent Lipid Panel    Component Value Date/Time   CHOL 154 02/19/2020 1526   TRIG 93 03/08/2020 1141   HDL 43.60 02/19/2020 1526   CHOLHDL 4 02/19/2020 1526   VLDL 41.2 (H) 02/19/2020 1526   LDLCALC 105 (H) 08/28/2017 1410   LDLDIRECT 98.0 02/19/2020 1526     Risk Assessment/Calculations:    CHA2DS2-VASc Score = 6  This indicates a 9.7% annual risk of stroke. The patient's score is based upon: CHF History: 1 HTN History: 1 Diabetes History: 0 Stroke History: 0 Vascular Disease History: 1 Age Score: 2 Gender Score: 1    Physical Exam:    VS:  BP 120/80   Pulse 82   Ht '5\' 5"'$  (1.651 m)   Wt 177 lb 3.2 oz (80.4 kg)   SpO2 98%   BMI 29.49 kg/m     Wt Readings from Last 3 Encounters:  01/27/22 177 lb 3.2 oz (80.4 kg)  01/11/22 175 lb 3.2 oz (79.5 kg)  01/03/22  177 lb 11.2 oz (80.6 kg)     GEN:  Well nourished, well developed in no acute distress HEENT: Normal NECK: No JVD; No carotid bruits CARDIAC: Irregular RR, no murmurs, rubs, gallops RESPIRATORY:  Clear to auscultation without rales, wheezing or rhonchi  ABDOMEN: Soft, non-tender, non-distended MUSCULOSKELETAL:  No edema; No deformity. 2+ pedal pulses, equal bilaterally SKIN: Warm and dry NEUROLOGIC:   Alert and oriented x 3 PSYCHIATRIC:  Normal affect   EKG:  EKG is ordered today.  The ekg ordered today demonstrates atrial fibrillation at 82 bpm, rightward axis, and no ST abnormality   Diagnoses:    1. Persistent atrial fibrillation ([place])   2. Essential hypertension   3. Chronic heart failure with preserved ejection fraction (HFpEF) ([place])   4. Chronic anticoagulation   5. DOE (dyspnea on exertion)    Assessment and Plan:     Chronic HFpEF/DOE: Chronic stable LE edema and DOE. No additional evidence of volume overload on exam today. No orthopnea or PND.  Has taken courses of Lasix but does not take it daily. Is feeling well on current antihypertensive regimen and does not want to change medications. Encouraged her to notify us if she has worsening SOB, abdominal distention, or weight gain.   Persistent atrial fibrillation on chronic anticoagulation: EKG reveals a fib at 82 bpm today.  Discussion of treatment of A-fib  and she agrees that she does not wish to pursue rhythm control. Is relatively asymptomatic in AF.  States she was told to take an extra BP med for tachycardia. Unsure whether she is taking amlodipine or metoprolol. Advised her to ensure she is taking extra metoprolol. Will continue with rate control.  Has BP cuff and pulse oximeter to assist with monitoring HR.  Continue Eliquis 5 mg twice daily which is appropriate dose for age/weight/creatinine. Continue metoprolol.   Hypertension: BP is well-controlled     Disposition: 9 months with Dr. Angelena Form  Medication Adjustments/Labs and Tests Ordered: Current medicines are reviewed at length with the patient today.  Concerns regarding medicines are outlined above.  Orders Placed This Encounter  Procedures   EKG 12-Lead   No orders of the defined types were placed in this encounter.   Patient Instructions  Medication Instructions:  You may take a extra tablet of Metoprolol for Afib or fast heart rates   *If you need a  refill on your cardiac medications before your next appointment, please call your pharmacy*   Lab Work: None ordered   If you have labs (blood work) drawn today and your tests are completely normal, you will receive your results only by: [place] (if you have MyChart) OR A paper copy in the mail If you have any lab test that is abnormal or we need to change your treatment, we will call you to review the results.   Testing/Procedures: None ordered    Follow-Up: At [facility], you and your health needs are our priority.  As part of our continuing mission to provide you with exceptional heart care, we have created designated Provider Care Teams.  These Care Teams include your primary Cardiologist (physician) and Advanced Practice Providers (APPs -  Physician Assistants and Nurse Practitioners) who all work together to provide you with the care you need, when you need it.  We recommend signing up for the patient portal called "MyChart".  Sign up information is provided on this After Visit Summary.  MyChart is used to connect with patients for Virtual Visits (Telemedicine).  Patients are able to view lab/test results, encounter notes, upcoming appointments, etc.  Non-urgent messages can be sent to your provider as well.   To learn more about what you can do with MyChart, go to [email or web address].    Your next appointment:   8-9 month(s)  The format for your next appointment:   In Person  Provider:   Lauree Chandler, MD     Other Instructions   Important Information About Sugar         Signed, Emmaline Life, NP  01/28/2022 6:19 AM    [place]

## 2022-01-25 ENCOUNTER — Other Ambulatory Visit: Payer: Self-pay | Admitting: Hematology and Oncology

## 2022-01-25 DIAGNOSIS — C8203 Follicular lymphoma grade I, intra-abdominal lymph nodes: Secondary | ICD-10-CM

## 2022-01-27 ENCOUNTER — Ambulatory Visit: Payer: Medicare Other | Attending: Nurse Practitioner | Admitting: Nurse Practitioner

## 2022-01-27 ENCOUNTER — Encounter: Payer: Self-pay | Admitting: Nurse Practitioner

## 2022-01-27 VITALS — BP 120/80 | HR 82 | Ht 65.0 in | Wt 177.2 lb

## 2022-01-27 DIAGNOSIS — I4819 Other persistent atrial fibrillation: Secondary | ICD-10-CM | POA: Diagnosis not present

## 2022-01-27 DIAGNOSIS — I5032 Chronic diastolic (congestive) heart failure: Secondary | ICD-10-CM

## 2022-01-27 DIAGNOSIS — Z7901 Long term (current) use of anticoagulants: Secondary | ICD-10-CM | POA: Diagnosis not present

## 2022-01-27 DIAGNOSIS — I10 Essential (primary) hypertension: Secondary | ICD-10-CM | POA: Diagnosis not present

## 2022-01-27 DIAGNOSIS — R0609 Other forms of dyspnea: Secondary | ICD-10-CM

## 2022-01-27 NOTE — Patient Instructions (Signed)
Medication Instructions:  You may take a extra tablet of Metoprolol for Afib or fast heart rates   *If you need a refill on your cardiac medications before your next appointment, please call your pharmacy*   Lab Work: None ordered   If you have labs (blood work) drawn today and your tests are completely normal, you will receive your results only by: [place] (if you have MyChart) OR A paper copy in the mail If you have any lab test that is abnormal or we need to change your treatment, we will call you to review the results.   Testing/Procedures: None ordered    Follow-Up: At [facility], you and your health needs are our priority.  As part of our continuing mission to provide you with exceptional heart care, we have created designated Provider Care Teams.  These Care Teams include your primary Cardiologist (physician) and Advanced Practice Providers (APPs -  Physician Assistants and Nurse Practitioners) who all work together to provide you with the care you need, when you need it.  We recommend signing up for the patient portal called "MyChart".  Sign up information is provided on this After Visit Summary.  MyChart is used to connect with patients for Virtual Visits (Telemedicine).  Patients are able to view lab/test results, encounter notes, upcoming appointments, etc.  Non-urgent messages can be sent to your provider as well.   To learn more about what you can do with MyChart, go to [email or web address].    Your next appointment:   8-9 month(s)  The format for your next appointment:   In Person  Provider:   Lauree Chandler, MD     Other Instructions   Important Information About Sugar

## 2022-01-28 ENCOUNTER — Telehealth: Payer: Self-pay | Admitting: Hematology and Oncology

## 2022-01-28 ENCOUNTER — Encounter: Payer: Self-pay | Admitting: Nurse Practitioner

## 2022-01-28 NOTE — Telephone Encounter (Signed)
Scheduled appointment per staff message. Patient is aware. 

## 2022-02-01 ENCOUNTER — Ambulatory Visit
Admission: RE | Admit: 2022-02-01 | Discharge: 2022-02-01 | Disposition: A | Discharge status: Home / Self Care | Payer: Medicare Other | Source: Ambulatory Visit | Attending: Family Medicine | Admitting: Family Medicine

## 2022-02-01 DIAGNOSIS — Z1231 Encounter for screening mammogram for malignant neoplasm of breast: Secondary | ICD-10-CM

## 2022-02-18 ENCOUNTER — Other Ambulatory Visit: Payer: Self-pay | Admitting: Family Medicine

## 2022-02-18 DIAGNOSIS — J849 Interstitial pulmonary disease, unspecified: Secondary | ICD-10-CM

## 2022-03-22 ENCOUNTER — Ambulatory Visit: Payer: Medicare Other | Admitting: Student

## 2022-03-22 ENCOUNTER — Encounter: Payer: Self-pay | Admitting: Student

## 2022-03-22 VITALS — BP 118/70 | HR 74 | Ht 65.0 in | Wt 182.0 lb

## 2022-03-22 DIAGNOSIS — J3489 Other specified disorders of nose and nasal sinuses: Secondary | ICD-10-CM | POA: Insufficient documentation

## 2022-03-22 DIAGNOSIS — R350 Frequency of micturition: Secondary | ICD-10-CM | POA: Insufficient documentation

## 2022-03-22 DIAGNOSIS — R300 Dysuria: Secondary | ICD-10-CM

## 2022-03-22 HISTORY — DX: Frequency of micturition: R35.0

## 2022-03-22 LAB — POCT URINALYSIS DIP (MANUAL ENTRY)
Bilirubin, UA: NEGATIVE
Blood, UA: NEGATIVE
Glucose, UA: NEGATIVE mg/dL
Ketones, POC UA: NEGATIVE mg/dL
Leukocytes, UA: NEGATIVE
Nitrite, UA: NEGATIVE
Protein Ur, POC: NEGATIVE mg/dL
Spec Grav, UA: 1.02 (ref 1.010–1.025)
Urobilinogen, UA: 0.2 E.U./dL
pH, UA: 5.5 (ref 5.0–8.0)

## 2022-03-22 MED ORDER — MONTELUKAST SODIUM 10 MG PO TABS: 10.0000 mg | ORAL_TABLET | Freq: Every day | ORAL | 3 refills | Status: DC

## 2022-03-22 MED ORDER — AZELASTINE HCL 0.1 % NA SOLN: 2.0000 | Freq: Once | NASAL | 3 refills | Status: DC

## 2022-03-22 NOTE — Progress Notes (Signed)
    SUBJECTIVE:   CHIEF COMPLAINT / HPI:   Selena [name] is an 87 year-old female here for dysuria and runny nose.  Dark-colored urine -Her main concern is that her urine is a darker yellow color than usual -She says she had an infection in the hospital a while ago but it didn't burn like it usually does and was told due to her age she might not have the burning sensation with urine infections -Drinking plenty of water -No blood in urine -Gets pains in her belly once in a while before she has to use the bathroom -Having urinary frequency- has urinated 4 times this morning already -No fevers  Runny nose -Has been present for months.  Feels like it "runs like a faucet" -Has tried over-the-counter nasal sprays without relief -No fever chills or other sick symptoms  PERTINENT  PMH / PSH: ILD, hypertension, follicular lymphoma  OBJECTIVE:   BP 118/70   Pulse 74   Ht 5' 5"$  (1.651 m)   Wt 182 lb (82.6 kg)   SpO2 97%   BMI 30.29 kg/m  General: Well-appearing elderly female in no distress CV: Regular rate and rhythm Respiratory: Normal work of breathing on room air.  No wheezing or crackles.  No diminished lung sounds Abdomen: Soft and nontender to palpation.  No suprapubic tenderness   ASSESSMENT/PLAN:   Urinary frequency Urinary frequency for a few days. No concerning symptoms necessitating antibiotic treatment today for UTI as POC urinalysis negative for nitrites/leuks. No incontinence or other concerning symptoms. - F/u urine culture - Return precautions discussed  Rhinorrhea Likely allergic rhinitis - Start Azelastine spray daily - F/u if no improvement     Orvis Brill, DO [place]

## 2022-03-22 NOTE — Patient Instructions (Addendum)
It was great seeing you today.  Continue drinking plenty of water. I will call you if your urine shows infection.  START using Azelastine nasal spray 2 puffs per nostril once daily.  If you have any questions or concerns, please feel free to call the clinic.   Have a wonderful day,  Dr. Orvis Brill [facility] [phone]

## 2022-03-22 NOTE — Assessment & Plan Note (Signed)
Urinary frequency for a few days. No concerning symptoms necessitating antibiotic treatment today for UTI as POC urinalysis negative for nitrites/leuks. No incontinence or other concerning symptoms. - F/u urine culture - Return precautions discussed

## 2022-03-22 NOTE — Assessment & Plan Note (Signed)
Likely allergic rhinitis - Start Azelastine spray daily - F/u if no improvement

## 2022-03-24 LAB — URINE CULTURE

## 2022-03-25 NOTE — Telephone Encounter (Signed)
Received notification from [place] ([place]) regarding patient assistance DENIAL for ELIQUIS.   3% out of pocket rx expenses based on household income, not met.  Phone: [phone]

## 2022-03-28 ENCOUNTER — Other Ambulatory Visit: Payer: Medicare Other

## 2022-03-28 ENCOUNTER — Ambulatory Visit: Payer: Medicare Other | Admitting: Hematology and Oncology

## 2022-04-01 ENCOUNTER — Other Ambulatory Visit: Payer: Self-pay

## 2022-04-01 MED ORDER — METOPROLOL TARTRATE 25 MG PO TABS: 25.0000 mg | ORAL_TABLET | Freq: Two times a day (BID) | ORAL | 3 refills | Status: DC

## 2022-04-19 ENCOUNTER — Other Ambulatory Visit: Payer: Self-pay | Admitting: Cardiovascular Disease

## 2022-04-19 DIAGNOSIS — I10 Essential (primary) hypertension: Secondary | ICD-10-CM

## 2022-04-19 MED ORDER — AMLODIPINE BESYLATE 5 MG PO TABS: 5.0000 mg | ORAL_TABLET | Freq: Every day | ORAL | 3 refills | Status: DC

## 2022-04-20 NOTE — Progress Notes (Signed)
Patient Care Team: McDiarmid, Blane Ohara, MD as PCP - General (Family Medicine) Burnell Blanks, MD as PCP - Cardiology (Cardiology) Laurin Coder, MD as Consulting Physician (Pulmonary Disease) Mcarthur Rossetti, MD as Consulting Physician (Orthopedic Surgery) Nicholas Lose, MD as Consulting Physician (Hematology and Oncology) [facility], Melanie Crazier, MD as Consulting Physician (Endocrinology) Noralyn Pick, NP as Nurse Practitioner (Gastroenterology) Evans Lance, MD as Consulting Physician (Cardiology) Rolm Bookbinder, MD as Consulting Physician (General Surgery)  DIAGNOSIS: No diagnosis found.  SUMMARY OF ONCOLOGIC HISTORY: Oncology History  Breast cancer of upper-outer quadrant of right female breast ([place])  04/14/2011 Surgery   Right mastectomy: 2 foci of invasive ductal carcinoma 0.2 cm, grade 1 with background extensive DCIS, 0/5 lymph nodes negative,focus 1: ER 94%, PR 100%, Ki-67 5%, HER-2 negative: Focus to ER 100%BR 6% Ki-67 29% HER-2 negative   05/02/2011 -  Anti-estrogen oral therapy   letrozole 2.5 mg daily   Follicular 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 ([name])  07/24/2014 Initial Diagnosis   Retroperitoneal mass biopsy left side: Follicular lymphoma low-grade grade 1-2, flow cytometry positive for CD10, 20, 3, 10, 5, BCL-2, BCl-6, CD21, Ki-67 less than 10%   08/13/2014 - 12/06/2016 Chemotherapy   Bendamustine and Rituxan day 1, 2 every 4 weeks 6 cycles followed by Rituxan maintenance every 2 months 2 years completed 12/06/2016   11/03/2014 Imaging   midpoint of chemotherapy: Moderate improvement in the periaortic lymphadenopathy without complete resolution   01/16/2015 PET scan    interval decrease in size and metabolic activity of the large left retroperitoneal mass , no increase in activity, size 10 mm decreased from 48 mm  SUV 1.9 , no additional hypermetabolic activity     CHIEF COMPLIANT: Follow-up breast cancer  and follicular lymphoma   INTERVAL HISTORY: Selena [name] is a 87 y.o. with above-mentioned history of breast cancer and follicular lymphoma. She presents to the clinic for a follow-up.    ALLERGIES:  is allergic to codeine phosphate, adhesive [tape], and codeine.  MEDICATIONS:  Current Outpatient Medications  Medication Sig Dispense Refill   albuterol (VENTOLIN HFA) 108 (90 Base) MCG/ACT inhaler INHALE 2 PUFFS INTO THE LUNGS EVERY 4 HOURS AS NEEDED FOR WHEEZING OR SHORTNESS OF BREATH 18 g 0   amLODipine (NORVASC) 5 MG tablet Take 1 tablet (5 mg total) by mouth daily. 90 tablet 3   apixaban (ELIQUIS) 5 MG TABS tablet TAKE 1 TABLET(5 MG) BY MOUTH TWICE DAILY 60 tablet 5   azelastine (ASTELIN) 0.1 % nasal spray Place 2 sprays into both nostrils once for 1 dose. Use in each nostril as directed 30 mL 3   Azelastine-Fluticasone 137-50 MCG/ACT SUSP Place 1 puff into the nose daily. 23 g 12   chlorpheniramine (CHLOR-TRIMETON) 4 MG tablet Take 1 tablet (4 mg total) by mouth 2 (two) times daily as needed for allergies. 60 tablet 5   famotidine (PEPCID) 20 MG tablet TAKE 1 TABLET(20 MG) BY MOUTH AT BEDTIME 90 tablet 1   guaiFENesin-codeine 100-10 MG/5ML syrup TAKE 2.5 TO 5 ML BY MOUTH THREE TIMES DAILY AS NEEDED FOR COUGH 120 mL 0   metoprolol tartrate (LOPRESSOR) 25 MG tablet Take 1 tablet (25 mg total) by mouth 2 (two) times daily. 180 tablet 3   montelukast (SINGULAIR) 10 MG tablet TAKE 1 TABLET(10 MG) BY MOUTH AT BEDTIME 90 tablet 2   montelukast (SINGULAIR) 10 MG tablet Take 1 tablet (10 mg total) by mouth at bedtime. 30 tablet 3  No current facility-administered medications for this visit.    PHYSICAL EXAMINATION: ECOG PERFORMANCE STATUS: {CHL ONC ECOG [phone]}  There were no vitals filed for this visit. There were no vitals filed for this visit.  BREAST:*** No palpable masses or nodules in either right or left breasts. No palpable axillary supraclavicular or infraclavicular  adenopathy no breast tenderness or nipple discharge. (exam performed in the presence of a chaperone)  LABORATORY DATA:  I have reviewed the data as listed    Latest Ref Rng & Units 01/11/2022    1:22 PM 11/30/2021    4:57 PM 10/27/2021    3:19 PM  CMP  Glucose 70 - 99 mg/dL 96  88  91   BUN 8 - 23 mg/dL 18  17  12    Creatinine 0.44 - 1.00 mg/dL 0.74  0.69  0.66   Sodium 135 - 145 mmol/L 141  142  143   Potassium 3.5 - 5.1 mmol/L 5.2  4.4  4.1   Chloride 98 - 111 mmol/L 105  101  103   CO2 22 - 32 mmol/L 28  22  23    Calcium 8.9 - 10.3 mg/dL 9.5  9.7  9.3   Total Protein 6.5 - 8.1 g/dL 6.4  6.3    Total Bilirubin 0.3 - 1.2 mg/dL 0.6  0.5    Alkaline Phos 38 - 126 U/L 83  119    AST 15 - 41 U/L 18  15    ALT 0 - 44 U/L 16  11      Lab Results  Component Value Date   WBC 9.9 01/11/2022   HGB 15.7 (H) 01/11/2022   HCT 46.7 (H) 01/11/2022   MCV 93.2 01/11/2022   PLT 194 01/11/2022   NEUTROABS 7.0 01/11/2022    ASSESSMENT & PLAN:  No problem-specific Assessment & Plan notes found for this encounter.    No orders of the defined types were placed in this encounter.  The patient has a good understanding of the overall plan. she agrees with it. she will call with any problems that may develop before the next visit here. Total time spent: 30 mins including face to face time and time spent for planning, charting and co-ordination of care   Suzzette Righter, Pray 04/20/22    I Gardiner Coins am acting as a Education administrator for [employer]  ***

## 2022-04-26 ENCOUNTER — Inpatient Hospital Stay: Payer: Medicare Other | Attending: Hematology and Oncology

## 2022-04-26 ENCOUNTER — Other Ambulatory Visit: Payer: Self-pay

## 2022-04-26 ENCOUNTER — Inpatient Hospital Stay: Payer: Medicare Other | Admitting: Hematology and Oncology

## 2022-04-26 VITALS — BP 137/79 | HR 71 | Temp 97.2°F | Resp 18 | Ht 65.0 in | Wt 185.5 lb

## 2022-04-26 DIAGNOSIS — Z7901 Long term (current) use of anticoagulants: Secondary | ICD-10-CM | POA: Diagnosis not present

## 2022-04-26 DIAGNOSIS — R918 Other nonspecific abnormal finding of lung field: Secondary | ICD-10-CM | POA: Diagnosis not present

## 2022-04-26 DIAGNOSIS — C8203 Follicular lymphoma grade I, intra-abdominal lymph nodes: Secondary | ICD-10-CM

## 2022-04-26 DIAGNOSIS — Z79899 Other long term (current) drug therapy: Secondary | ICD-10-CM | POA: Insufficient documentation

## 2022-04-26 DIAGNOSIS — Z9221 Personal history of antineoplastic chemotherapy: Secondary | ICD-10-CM | POA: Insufficient documentation

## 2022-04-26 DIAGNOSIS — C8200 Follicular lymphoma grade I, unspecified site: Secondary | ICD-10-CM | POA: Insufficient documentation

## 2022-04-26 DIAGNOSIS — Z9011 Acquired absence of right breast and nipple: Secondary | ICD-10-CM | POA: Diagnosis not present

## 2022-04-26 DIAGNOSIS — Z79811 Long term (current) use of aromatase inhibitors: Secondary | ICD-10-CM | POA: Insufficient documentation

## 2022-04-26 DIAGNOSIS — Z853 Personal history of malignant neoplasm of breast: Secondary | ICD-10-CM | POA: Insufficient documentation

## 2022-04-26 LAB — CBC WITH DIFFERENTIAL (CANCER CENTER ONLY)
Abs Immature Granulocytes: 0.02 10*3/uL (ref 0.00–0.07)
Basophils Absolute: 0.1 10*3/uL (ref 0.0–0.1)
Basophils Relative: 1 %
Eosinophils Absolute: 0.3 10*3/uL (ref 0.0–0.5)
Eosinophils Relative: 3 %
HCT: 42.7 % (ref 36.0–46.0)
Hemoglobin: 14.4 g/dL (ref 12.0–15.0)
Immature Granulocytes: 0 %
Lymphocytes Relative: 20 %
Lymphs Abs: 1.7 10*3/uL (ref 0.7–4.0)
MCH: 31.7 pg (ref 26.0–34.0)
MCHC: 33.7 g/dL (ref 30.0–36.0)
MCV: 94.1 fL (ref 80.0–100.0)
Monocytes Absolute: 1.1 10*3/uL — ABNORMAL HIGH (ref 0.1–1.0)
Monocytes Relative: 12 %
Neutro Abs: 5.6 10*3/uL (ref 1.7–7.7)
Neutrophils Relative %: 64 %
Platelet Count: 169 10*3/uL (ref 150–400)
RBC: 4.54 MIL/uL (ref 3.87–5.11)
RDW: 13.6 % (ref 11.5–15.5)
WBC Count: 8.8 10*3/uL (ref 4.0–10.5)
nRBC: 0 % (ref 0.0–0.2)

## 2022-04-26 LAB — CMP (CANCER CENTER ONLY)
ALT: 16 U/L (ref 0–44)
AST: 18 U/L (ref 15–41)
Albumin: 4.1 g/dL (ref 3.5–5.0)
Alkaline Phosphatase: 97 U/L (ref 38–126)
Anion gap: 5 (ref 5–15)
BUN: 20 mg/dL (ref 8–23)
CO2: 29 mmol/L (ref 22–32)
Calcium: 9.2 mg/dL (ref 8.9–10.3)
Chloride: 108 mmol/L (ref 98–111)
Creatinine: 0.66 mg/dL (ref 0.44–1.00)
GFR, Estimated: >60 mL/min (ref >60)
Glucose, Bld: 105 mg/dL — ABNORMAL HIGH (ref 70–99)
Potassium: 4.4 mmol/L (ref 3.5–5.1)
Sodium: 142 mmol/L (ref 135–145)
Total Bilirubin: 0.7 mg/dL (ref 0.3–1.2)
Total Protein: 5.9 g/dL — ABNORMAL LOW (ref 6.5–8.1)

## 2022-04-26 LAB — LACTATE DEHYDROGENASE: LDH: 190 U/L (ref 98–192)

## 2022-04-26 NOTE — Progress Notes (Signed)
Patient Care Team: McDiarmid, Blane Ohara, MD as PCP - General (Family Medicine) Burnell Blanks, MD as PCP - Cardiology (Cardiology) Laurin Coder, MD as Consulting Physician (Pulmonary Disease) Mcarthur Rossetti, MD as Consulting Physician (Orthopedic Surgery) Nicholas Lose, MD as Consulting Physician (Hematology and Oncology) [facility], Melanie Crazier, MD as Consulting Physician (Endocrinology) Noralyn Pick, NP as Nurse Practitioner (Gastroenterology) Evans Lance, MD as Consulting Physician (Cardiology) Rolm Bookbinder, MD as Consulting Physician (General Surgery)  DIAGNOSIS:  Encounter Diagnosis  Name Primary?   Follicular 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 Yes    SUMMARY OF ONCOLOGIC HISTORY: Oncology History  Breast cancer of upper-outer quadrant of right female breast  04/14/2011 Surgery   Right mastectomy: 2 foci of invasive ductal carcinoma 0.2 cm, grade 1 with background extensive DCIS, 0/5 lymph nodes negative,focus 1: ER 94%, PR 100%, Ki-67 5%, HER-2 negative: Focus to ER 100%BR 6% Ki-67 29% HER-2 negative   05/02/2011 -  Anti-estrogen oral therapy   letrozole 2.5 mg daily   Follicular 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  07/24/2014 Initial Diagnosis   Retroperitoneal mass biopsy left side: Follicular lymphoma low-grade grade 1-2, flow cytometry positive for CD10, 20, 3, 10, 5, BCL-2, BCl-6, CD21, Ki-67 less than 10%   08/13/2014 - 12/06/2016 Chemotherapy   Bendamustine and Rituxan day 1, 2 every 4 weeks 6 cycles followed by Rituxan maintenance every 2 months 2 years completed 12/06/2016   11/03/2014 Imaging   midpoint of chemotherapy: Moderate improvement in the periaortic lymphadenopathy without complete resolution   01/16/2015 PET scan    interval decrease in size and metabolic activity of the large left retroperitoneal mass , no increase in activity, size 10 mm decreased from 48 mm  SUV 1.9 , no  additional hypermetabolic activity     CHIEF COMPLIANT: Follow-up follicular lymphoma  INTERVAL HISTORY: Selena [name] is a 87 y.o. with above-mentioned history of breast cancer and follicular lymphoma. She presents to the clinic for a follow-up. She denies any pain. She says she does have a coughing spell that stated [place] month at the beach. She does walk a mile a day and she says she does get short of breath.  She has gained some weight and denies any pain in the lymph nodes.   ALLERGIES:  is allergic to codeine phosphate, adhesive [tape], and codeine.  MEDICATIONS:  Current Outpatient Medications  Medication Sig Dispense Refill   pantoprazole (PROTONIX) 40 MG tablet Take 40 mg by mouth 2 (two) times daily.     albuterol (VENTOLIN HFA) 108 (90 Base) MCG/ACT inhaler INHALE 2 PUFFS INTO THE LUNGS EVERY 4 HOURS AS NEEDED FOR WHEEZING OR SHORTNESS OF BREATH 18 g 0   amLODipine (NORVASC) 5 MG tablet Take 1 tablet (5 mg total) by mouth daily. 90 tablet 3   apixaban (ELIQUIS) 5 MG TABS tablet TAKE 1 TABLET(5 MG) BY MOUTH TWICE DAILY 60 tablet 5   azelastine (ASTELIN) 0.1 % nasal spray Place 2 sprays into both nostrils once for 1 dose. Use in each nostril as directed 30 mL 3   Azelastine-Fluticasone 137-50 MCG/ACT SUSP Place 1 puff into the nose daily. 23 g 12   chlorpheniramine (CHLOR-TRIMETON) 4 MG tablet Take 1 tablet (4 mg total) by mouth 2 (two) times daily as needed for allergies. 60 tablet 5   famotidine (PEPCID) 20 MG tablet TAKE 1 TABLET(20 MG) BY MOUTH AT BEDTIME 90 tablet 1   guaiFENesin-codeine 100-10 MG/5ML syrup TAKE 2.5 TO  5 ML BY MOUTH THREE TIMES DAILY AS NEEDED FOR COUGH 120 mL 0   metoprolol tartrate (LOPRESSOR) 25 MG tablet Take 1 tablet (25 mg total) by mouth 2 (two) times daily. 180 tablet 3   montelukast (SINGULAIR) 10 MG tablet TAKE 1 TABLET(10 MG) BY MOUTH AT BEDTIME 90 tablet 2   montelukast (SINGULAIR) 10 MG tablet Take 1 tablet (10 mg total) by mouth at bedtime. 30  tablet 3   No current facility-administered medications for this visit.    PHYSICAL EXAMINATION: ECOG PERFORMANCE STATUS: 1 - Symptomatic but completely ambulatory  Vitals:   04/26/22 1440  BP: 137/79  Pulse: 71  Resp: 18  Temp: (!) 97.2 F (36.2 C)  SpO2: 98%   Filed Weights   04/26/22 1440  Weight: 185 lb 8 oz (84.1 kg)      LABORATORY DATA:  I have reviewed the data as listed    Latest Ref Rng & Units 04/26/2022    2:27 PM 01/11/2022    1:22 PM 11/30/2021    4:57 PM  CMP  Glucose 70 - 99 mg/dL 105  96  88   BUN 8 - 23 mg/dL 20  18  17    Creatinine 0.44 - 1.00 mg/dL 0.66  0.74  0.69   Sodium 135 - 145 mmol/L 142  141  142   Potassium 3.5 - 5.1 mmol/L 4.4  5.2  4.4   Chloride 98 - 111 mmol/L 108  105  101   CO2 22 - 32 mmol/L 29  28  22    Calcium 8.9 - 10.3 mg/dL 9.2  9.5  9.7   Total Protein 6.5 - 8.1 g/dL 5.9  6.4  6.3   Total Bilirubin 0.3 - 1.2 mg/dL 0.7  0.6  0.5   Alkaline Phos 38 - 126 U/L 97  83  119   AST 15 - 41 U/L 18  18  15    ALT 0 - 44 U/L 16  16  11      Lab Results  Component Value Date   WBC 8.8 04/26/2022   HGB 14.4 04/26/2022   HCT 42.7 04/26/2022   MCV 94.1 04/26/2022   PLT 169 04/26/2022   NEUTROABS 5.6 04/26/2022    ASSESSMENT & PLAN:  Follicular 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 ([name]) Retroperitoneal mass biopsy left side 99991111: Follicular lymphoma low-grade grade 1-2, flow cytometry positive for CD10, 20, 3, 10, 5, BCL-2, BCl-6, CD21, Ki-67 less than 10%, stage IB (8X 4 cm mass)   Prior treatment:systemic chemotherapy with bendamustine and Rituxan 6 cycles (  From 08/13/2014 to 01/01/2015) followed by Rituxan maintenance every 2 months for 2 years completed 12/06/2016   CT CAP 12/10/2021: Multiple lymph nodes/soft tissue nodules left common iliac chain and in the sigmoid mesocolon, diffuse subpleural reticulation, 6 mm right upper lobe pulmonary nodule stable  Patient received a second opinion consultation with  Dr. Lorenso Courier who recommended watchful monitoring and no immediate treatment. We will perform CT chest abdomen pelvis in 2 months and follow-up after that to discuss results.  Orders Placed This Encounter  Procedures   CT CHEST ABDOMEN PELVIS W CONTRAST    Standing Status:   Future    Standing Expiration Date:   04/26/2023    Order Specific Question:   If indicated for the ordered procedure, I authorize the administration of contrast media per Radiology protocol    Answer:   Yes    Order Specific Question:   Does the patient have  a contrast media/X-ray dye allergy?    Answer:   No    Order Specific Question:   Preferred imaging location?    Answer:   [facility]    Order Specific Question:   Is Oral Contrast requested for this exam?    Answer:   Yes, Per Radiology protocol   The patient has a good understanding of the overall plan. she agrees with it. she will call with any problems that may develop before the next visit here. Total time spent: 30 mins including face to face time and time spent for planning, charting and co-ordination of care   Harriette Ohara, MD 04/26/22    I Gardiner Coins am acting as a Education administrator for [employer]  I have reviewed the above documentation for accuracy and completeness, and I agree with the above.

## 2022-04-26 NOTE — Assessment & Plan Note (Signed)
Retroperitoneal mass biopsy left side 99991111: Follicular lymphoma low-grade grade 1-2, flow cytometry positive for CD10, 20, 3, 10, 5, BCL-2, BCl-6, CD21, Ki-67 less than 10%, stage IB (8X 4 cm mass)   Prior treatment:systemic chemotherapy with bendamustine and Rituxan 6 cycles (  From 08/13/2014 to 01/01/2015) followed by Rituxan maintenance every 2 months for 2 years completed 12/06/2016   CT CAP 12/10/2021: Multiple lymph nodes/soft tissue nodules left common iliac chain and in the sigmoid mesocolon, diffuse subpleural reticulation, 6 mm right upper lobe pulmonary nodule stable  Patient received a second opinion consultation with Dr. Lorenso Courier who recommended watchful monitoring and no immediate treatment.

## 2022-04-27 ENCOUNTER — Telehealth: Payer: Self-pay | Admitting: Hematology and Oncology

## 2022-04-27 NOTE — Telephone Encounter (Signed)
Scheduled appointment per 4/2 los. Patient is aware of the made appointment.

## 2022-04-29 ENCOUNTER — Other Ambulatory Visit: Payer: Self-pay | Admitting: Family Medicine

## 2022-04-29 DIAGNOSIS — J849 Interstitial pulmonary disease, unspecified: Secondary | ICD-10-CM

## 2022-05-21 ENCOUNTER — Other Ambulatory Visit: Payer: Self-pay | Admitting: Nurse Practitioner

## 2022-06-20 NOTE — Progress Notes (Signed)
Patient Care Team: McDiarmid, Leighton Roach, MD as PCP - General (Family Medicine) Kathleene Hazel, MD as PCP - Cardiology (Cardiology) Tomma Lightning, MD as Consulting Physician (Pulmonary Disease) Kathryne Hitch, MD as Consulting Physician (Orthopedic Surgery) Serena Croissant, MD as Consulting Physician (Hematology and Oncology) [facility], Konrad Dolores, MD as Consulting Physician (Endocrinology) Arnaldo Natal, NP as Nurse Practitioner (Gastroenterology) Marinus Maw, MD as Consulting Physician (Cardiology) Emelia Loron, MD as Consulting Physician (General Surgery)  DIAGNOSIS:  Encounter Diagnoses  Name Primary?   Malignant neoplasm of upper-outer quadrant of right breast in female, estrogen receptor positive ([name]) Yes   Follicular 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 ([name])     SUMMARY OF ONCOLOGIC HISTORY: Oncology History  Breast cancer of upper-outer quadrant of right female breast ([name])  04/14/2011 Surgery   Right mastectomy: 2 foci of invasive ductal carcinoma 0.2 cm, grade 1 with background extensive DCIS, 0/5 lymph nodes negative,focus 1: ER 94%, PR 100%, Ki-67 5%, HER-2 negative: Focus to ER 100%BR 6% Ki-67 29% HER-2 negative   05/02/2011 -  Anti-estrogen oral therapy   letrozole 2.5 mg daily   Follicular 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 ([name])  07/24/2014 Initial Diagnosis   Retroperitoneal mass biopsy left side: Follicular lymphoma low-grade grade 1-2, flow cytometry positive for CD10, 20, 3, 10, 5, BCL-2, BCl-6, CD21, Ki-67 less than 10%   08/13/2014 - 12/06/2016 Chemotherapy   Bendamustine and Rituxan day 1, 2 every 4 weeks 6 cycles followed by Rituxan maintenance every 2 months 2 years completed 12/06/2016   11/03/2014 Imaging   midpoint of chemotherapy: Moderate improvement in the periaortic lymphadenopathy without complete resolution   01/16/2015 PET scan    interval decrease in size and metabolic  activity of the large left retroperitoneal mass , no increase in activity, size 10 mm decreased from 48 mm  SUV 1.9 , no additional hypermetabolic activity     CHIEF COMPLIANT: Follow-up breast cancer and follicular lymphoma   INTERVAL HISTORY: Selena [name] is a 87 y.o. with above-mentioned history of breast cancer and follicular lymphoma. She presents to the clinic for a follow-up. She complains of diarrhea. She says she doesn't have an appetite. She is having a lot of fatigue.   ALLERGIES:  is allergic to codeine phosphate, adhesive [tape], and codeine.  MEDICATIONS:  Current Outpatient Medications  Medication Sig Dispense Refill   amLODipine (NORVASC) 5 MG tablet Take 1 tablet (5 mg total) by mouth daily. 90 tablet 3   apixaban (ELIQUIS) 5 MG TABS tablet TAKE 1 TABLET(5 MG) BY MOUTH TWICE DAILY 60 tablet 5   famotidine (PEPCID) 20 MG tablet TAKE 1 TABLET(20 MG) BY MOUTH AT BEDTIME 90 tablet 1   metoprolol tartrate (LOPRESSOR) 25 MG tablet Take 1 tablet (25 mg total) by mouth 2 (two) times daily. 180 tablet 3   montelukast (SINGULAIR) 10 MG tablet Take 1 tablet (10 mg total) by mouth at bedtime. 30 tablet 3   pantoprazole (PROTONIX) 40 MG tablet Take 40 mg by mouth 2 (two) times daily.     VENTOLIN HFA 108 (90 Base) MCG/ACT inhaler INHALE 2 PUFFS INTO THE LUNGS EVERY 4 HOURS AS NEEDED FOR WHEEZING OR SHORTNESS OF BREATH 18 g 0   azelastine (ASTELIN) 0.1 % nasal spray Place 2 sprays into both nostrils once for 1 dose. Use in each nostril as directed 30 mL 3   No current facility-administered medications for this visit.    PHYSICAL EXAMINATION: ECOG PERFORMANCE  STATUS: 1 - Symptomatic but completely ambulatory  Vitals:   06/30/22 1500  BP: 136/71  Pulse: 80  Resp: 18  Temp: (!) 97.4 F (36.3 C)  SpO2: 98%   Filed Weights   06/30/22 1500  Weight: 185 lb 11.2 oz (84.2 kg)      LABORATORY DATA:  I have reviewed the data as listed    Latest Ref Rng & Units 06/23/2022     2:44 PM 04/26/2022    2:27 PM 01/11/2022    1:22 PM  CMP  Glucose 70 - 99 mg/dL 88  161  96   BUN 8 - 27 mg/dL 15  20  18    Creatinine 0.57 - 1.00 mg/dL 0.96  0.45  4.09   Sodium 134 - 144 mmol/L 145  142  141   Potassium 3.5 - 5.2 mmol/L 4.4  4.4  5.2   Chloride 96 - 106 mmol/L 104  108  105   CO2 20 - 29 mmol/L 23  29  28    Calcium 8.7 - 10.3 mg/dL 9.5  9.2  9.5   Total Protein 6.5 - 8.1 g/dL  5.9  6.4   Total Bilirubin 0.3 - 1.2 mg/dL  0.7  0.6   Alkaline Phos 38 - 126 U/L  97  83   AST 15 - 41 U/L  18  18   ALT 0 - 44 U/L  16  16     Lab Results  Component Value Date   WBC 7.9 06/23/2022   HGB 14.5 06/23/2022   HCT 42.4 06/23/2022   MCV 93 06/23/2022   PLT 169 06/23/2022   NEUTROABS 5.6 04/26/2022    ASSESSMENT & PLAN:  Breast cancer of upper-outer quadrant of right female breast ([name]) Retroperitoneal mass biopsy left side 07/24/2014: Follicular lymphoma low-grade grade 1-2, flow cytometry positive for CD10, 20, 3, 10, 5, BCL-2, BCl-6, CD21, Ki-67 less than 10%, stage IB (8X 4 cm mass)   Prior treatment:systemic chemotherapy with bendamustine and Rituxan 6 cycles (  From 08/13/2014 to 01/01/2015) followed by Rituxan maintenance every 2 months for 2 years completed 12/06/2016   CT CAP 12/10/2021: Multiple lymph nodes/soft tissue nodules left common iliac chain and in the sigmoid mesocolon, diffuse subpleural reticulation, 6 mm right upper lobe pulmonary nodule stable   Patient received a second opinion consultation with Dr. Leonides Schanz who recommended watchful monitoring and no immediate treatment. CT CAP 06/27/2022: Stable left pelvic sidewall and sigmoid mesocolon lymph nodes  Labs and PET CT scan in 1 year follow-up   Orders Placed This Encounter  Procedures   NM PET Image Restag (PS) Skull Base To Thigh    Standing Status:   Future    Standing Expiration Date:   06/30/2023    Order Specific Question:   If indicated for the ordered procedure, I authorize the  administration of a radiopharmaceutical per Radiology protocol    Answer:   Yes    Order Specific Question:   Preferred imaging location?    Answer:   [facility]    Order Specific Question:   Release to patient    Answer:   Immediate   The patient has a good understanding of the overall plan. she agrees with it. she will call with any problems that may develop before the next visit here. Total time spent: 30 mins including face to face time and time spent for planning, charting and co-ordination of care   Tamsen Meek, MD 06/30/22  I Janan Ridge am acting as a Neurosurgeon for [employer]  I have reviewed the above documentation for accuracy and completeness, and I agree with the above.

## 2022-06-23 ENCOUNTER — Ambulatory Visit (INDEPENDENT_AMBULATORY_CARE_PROVIDER_SITE_OTHER): Payer: Medicare Other | Admitting: Family Medicine

## 2022-06-23 ENCOUNTER — Encounter: Payer: Self-pay | Admitting: Family Medicine

## 2022-06-23 VITALS — BP 108/60 | HR 82 | Ht 65.0 in | Wt 184.4 lb

## 2022-06-23 DIAGNOSIS — Z23 Encounter for immunization: Secondary | ICD-10-CM | POA: Diagnosis not present

## 2022-06-23 DIAGNOSIS — Z79899 Other long term (current) drug therapy: Secondary | ICD-10-CM | POA: Diagnosis not present

## 2022-06-23 DIAGNOSIS — E042 Nontoxic multinodular goiter: Secondary | ICD-10-CM

## 2022-06-23 DIAGNOSIS — R159 Full incontinence of feces: Secondary | ICD-10-CM

## 2022-06-23 DIAGNOSIS — N819 Female genital prolapse, unspecified: Secondary | ICD-10-CM

## 2022-06-23 DIAGNOSIS — I4821 Permanent atrial fibrillation: Secondary | ICD-10-CM

## 2022-06-23 DIAGNOSIS — R053 Chronic cough: Secondary | ICD-10-CM

## 2022-06-23 DIAGNOSIS — I10 Essential (primary) hypertension: Secondary | ICD-10-CM

## 2022-06-23 DIAGNOSIS — J300 Vasomotor rhinitis: Secondary | ICD-10-CM

## 2022-06-23 NOTE — Patient Instructions (Addendum)
Your blood pressure looks good, your weight is up about 7 to 9 pounds which is okay by me. Keep taking your Amlodipine  We have sent a referral to the [facility] over in the [place] area.  They will work on your pelvic muscle strength which will help prevent your stool from accidentally coming out.   We are checking blood test to make sure there is no major cause of your fatigue after walking a mile.  Dr Bradlee Bridgers is impressed with your staying active.  Keep it up!  Dr Aymara Sassi would like to see you back in 6 months for a check up.

## 2022-06-24 ENCOUNTER — Encounter: Payer: Self-pay | Admitting: Family Medicine

## 2022-06-24 LAB — BASIC METABOLIC PANEL
BUN/Creatinine Ratio: 21 (ref 12–28)
BUN: 15 mg/dL (ref 8–27)
CO2: 23 mmol/L (ref 20–29)
Calcium: 9.5 mg/dL (ref 8.7–10.3)
Chloride: 104 mmol/L (ref 96–106)
Creatinine, Ser: 0.7 mg/dL (ref 0.57–1.00)
Glucose: 88 mg/dL (ref 70–99)
Potassium: 4.4 mmol/L (ref 3.5–5.2)
Sodium: 145 mmol/L — ABNORMAL HIGH (ref 134–144)
eGFR: 85 mL/min/{1.73_m2} (ref >59)

## 2022-06-24 LAB — CBC
Hematocrit: 42.4 % (ref 34.0–46.6)
Hemoglobin: 14.5 g/dL (ref 11.1–15.9)
MCH: 31.7 pg (ref 26.6–33.0)
MCHC: 34.2 g/dL (ref 31.5–35.7)
MCV: 93 fL (ref 79–97)
Platelets: 169 10*3/uL (ref 150–450)
RBC: 4.58 x10E6/uL (ref 3.77–5.28)
RDW: 12.5 % (ref 11.7–15.4)
WBC: 7.9 10*3/uL (ref 3.4–10.8)

## 2022-06-24 LAB — TSH: TSH: 0.45 u[IU]/mL (ref 0.450–4.500)

## 2022-06-24 NOTE — Progress Notes (Unsigned)
Selena [name] is alone Sources of clinical information for visit is/are patient. Nursing assessment for this office visit was reviewed with the patient for accuracy and revision.     Previous Report(s) Reviewed: Oncology office visit note     06/23/2022   10:57 AM  Depression screen PHQ 2/9  Decreased Interest 0  Down, Depressed, Hopeless 0  PHQ - 2 Score 0  Altered sleeping 3  Tired, decreased energy 1  Change in appetite 0  Feeling bad or failure about yourself  0  Trouble concentrating 1  Moving slowly or fidgety/restless 0  Suicidal thoughts 0  PHQ-9 Score 5  Difficult doing work/chores Not difficult at all   Flowsheet Row Office Visit from 06/23/2022 in [facility] Clinical Support from 11/24/2021 in [facility] [place] Office Visit from 10/28/2021 in [facility]  Thoughts that you would be better off dead, or of hurting yourself in some way Not at all Not at all Not at all  PHQ-9 Total Score 5 0 6          06/23/2022   10:57 AM 03/22/2022   10:29 AM 11/24/2021    2:21 PM 09/23/2021    1:34 PM 08/19/2021    1:24 PM  Fall Risk   Falls in the past year? 0 0 0 0 0  Number falls in past yr: 0 0 0 0   Injury with Fall? 0 0 0 0   Risk for fall due to :   No Fall Risks Impaired balance/gait   Follow up   Falls prevention discussed Falls evaluation completed        06/23/2022   10:57 AM 11/24/2021    2:16 PM 10/28/2021    1:27 PM  PHQ9 SCORE ONLY  PHQ-9 Total Score 5 0 6    There are no preventive care reminders to display for this patient.  Health Maintenance Due  Topic Date Due   Zoster Vaccines- Shingrix (1 of 2) Never done      History/P.E. limitations: none  There are no preventive care reminders to display for this patient. There are no preventive care reminders to display for this patient.  Health Maintenance Due  Topic Date Due   Zoster Vaccines- Shingrix (1 of 2) Never done      Chief Complaint  Patient presents with   Atrial Fibrillation     --------------------------------------------------------------------------------------------------------------------------------------------- Visit Problem List with A/P  Atrial fibrillation, permanent ([name]) Established problem Well Controlled and is at goal of being asymptomatic and rate is controlled . No signs of complications, medication side effects, or red flags. Continue current medications and other regiments.    Chronic vasomotor rhinitis Established problem Well Controlled. Patient is at goal of satisfactory reduction in chronic rhinorrhea No signs of complications, medication side effects, or red flags. Continue current medications and other regiments.   Chronic coughing Established problem Well Controlled. Patient is at goal of satisfactory reduction in chronic cough No signs of complications, medication side effects, or red flags. Continue current medications and other regiments.  Essential hypertension Established problem. Adequate blood pressure control.  No evidence of new end organ damage.  Tolerating medication without significant adverse effects.  Plan to continue current blood pressure medication regiment.    Incontinence of feces Established problem worsened.  Patient is not at goal of complete continence Having leakage off small amount of formed stool with cough, sneeze, transitioning from sitting to standing. Suspect anal sphincter  weakness Previous successful treatment with pelvic rehab therapy.  Order sent for repeat pelvic rehab.

## 2022-06-24 NOTE — Assessment & Plan Note (Signed)
Established problem. Adequate blood pressure control.  No evidence of new end organ damage.  Tolerating medication without significant adverse effects.  Plan to continue current blood pressure medication regiment.   

## 2022-06-24 NOTE — Assessment & Plan Note (Addendum)
Established problem worsened.  Patient is not at goal of complete continence Having leakage off small amount of formed stool with cough, sneeze, transitioning from sitting to standing. Suspect anal sphincter weakness Previous successful treatment with pelvic rehab therapy.  Order sent for repeat pelvic rehab.

## 2022-06-24 NOTE — Assessment & Plan Note (Addendum)
Established problem Well Controlled. Patient is at goal of satisfactory reduction in chronic rhinorrhea No signs of complications, medication side effects, or red flags. Continue current medications and other regiments.

## 2022-06-24 NOTE — Assessment & Plan Note (Signed)
Established problem Well Controlled. Patient is at goal of satisfactory reduction in chronic cough No signs of complications, medication side effects, or red flags. Continue current medications and other regiments.

## 2022-06-24 NOTE — Assessment & Plan Note (Signed)
Established problem Well Controlled and is at goal of being asymptomatic and rate is controlled . No signs of complications, medication side effects, or red flags. Continue current medications and other regiments.

## 2022-06-27 ENCOUNTER — Other Ambulatory Visit: Payer: Self-pay | Admitting: Family Medicine

## 2022-06-27 ENCOUNTER — Ambulatory Visit (HOSPITAL_BASED_OUTPATIENT_CLINIC_OR_DEPARTMENT_OTHER)
Admission: RE | Admit: 2022-06-27 | Discharge: 2022-06-27 | Disposition: A | Discharge status: Home / Self Care | Payer: Medicare Other | Source: Ambulatory Visit | Attending: Hematology and Oncology | Admitting: Hematology and Oncology

## 2022-06-27 DIAGNOSIS — C8203 Follicular lymphoma grade I, intra-abdominal lymph nodes: Secondary | ICD-10-CM | POA: Insufficient documentation

## 2022-06-27 DIAGNOSIS — J849 Interstitial pulmonary disease, unspecified: Secondary | ICD-10-CM

## 2022-06-27 MED ORDER — IOHEXOL 300 MG/ML  SOLN
100.0000 mL | Freq: Once | INTRAMUSCULAR | Status: AC | PRN
  Administered 2022-06-27: 85 mL via INTRAVENOUS

## 2022-06-30 ENCOUNTER — Other Ambulatory Visit: Payer: Self-pay

## 2022-06-30 ENCOUNTER — Inpatient Hospital Stay: Payer: Medicare Other | Attending: Hematology and Oncology | Admitting: Hematology and Oncology

## 2022-06-30 VITALS — BP 136/71 | HR 80 | Temp 97.4°F | Resp 18 | Ht 65.0 in | Wt 185.7 lb

## 2022-06-30 DIAGNOSIS — C50411 Malignant neoplasm of upper-outer quadrant of right female breast: Secondary | ICD-10-CM | POA: Diagnosis present

## 2022-06-30 DIAGNOSIS — R5383 Other fatigue: Secondary | ICD-10-CM | POA: Insufficient documentation

## 2022-06-30 DIAGNOSIS — C8200 Follicular lymphoma grade I, unspecified site: Secondary | ICD-10-CM | POA: Insufficient documentation

## 2022-06-30 DIAGNOSIS — Z170 Estrogen receptor positive status [ER+]: Secondary | ICD-10-CM | POA: Insufficient documentation

## 2022-06-30 DIAGNOSIS — C8203 Follicular lymphoma grade I, intra-abdominal lymph nodes: Secondary | ICD-10-CM

## 2022-06-30 DIAGNOSIS — Z7901 Long term (current) use of anticoagulants: Secondary | ICD-10-CM | POA: Diagnosis not present

## 2022-06-30 DIAGNOSIS — Z79811 Long term (current) use of aromatase inhibitors: Secondary | ICD-10-CM | POA: Diagnosis not present

## 2022-06-30 DIAGNOSIS — Z79899 Other long term (current) drug therapy: Secondary | ICD-10-CM | POA: Diagnosis not present

## 2022-06-30 DIAGNOSIS — Z9011 Acquired absence of right breast and nipple: Secondary | ICD-10-CM | POA: Insufficient documentation

## 2022-06-30 DIAGNOSIS — Z9221 Personal history of antineoplastic chemotherapy: Secondary | ICD-10-CM | POA: Insufficient documentation

## 2022-06-30 DIAGNOSIS — R197 Diarrhea, unspecified: Secondary | ICD-10-CM | POA: Insufficient documentation

## 2022-06-30 NOTE — Assessment & Plan Note (Signed)
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 Leonides Schanz who recommended watchful monitoring and no immediate treatment. CT CAP 06/27/2022: Stable left pelvic sidewall and sigmoid mesocolon lymph nodes  Labs and follow-up in 6 months

## 2022-07-04 ENCOUNTER — Other Ambulatory Visit: Payer: Self-pay | Admitting: Nurse Practitioner

## 2022-07-04 NOTE — Telephone Encounter (Signed)
Please advise 

## 2022-07-12 ENCOUNTER — Telehealth: Payer: Self-pay | Admitting: Hematology and Oncology

## 2022-07-12 NOTE — Telephone Encounter (Signed)
Scheduled appointment per 6/6 los. Patient is aware of the made appointment.

## 2022-07-19 ENCOUNTER — Other Ambulatory Visit: Payer: Self-pay | Admitting: Student

## 2022-08-10 ENCOUNTER — Ambulatory Visit: Payer: Medicare Other | Admitting: Physical Therapy

## 2022-08-10 ENCOUNTER — Encounter (HOSPITAL_COMMUNITY): Payer: Self-pay

## 2022-08-10 ENCOUNTER — Emergency Department (HOSPITAL_COMMUNITY)
Admission: EM | Admit: 2022-08-10 | Discharge: 2022-08-10 | Disposition: A | Discharge status: Home / Self Care | Payer: Medicare Other | Attending: Emergency Medicine | Admitting: Emergency Medicine

## 2022-08-10 ENCOUNTER — Other Ambulatory Visit: Payer: Self-pay

## 2022-08-10 DIAGNOSIS — Z8572 Personal history of non-Hodgkin lymphomas: Secondary | ICD-10-CM | POA: Diagnosis not present

## 2022-08-10 DIAGNOSIS — M25512 Pain in left shoulder: Secondary | ICD-10-CM | POA: Diagnosis present

## 2022-08-10 DIAGNOSIS — M62830 Muscle spasm of back: Secondary | ICD-10-CM

## 2022-08-10 DIAGNOSIS — Z79899 Other long term (current) drug therapy: Secondary | ICD-10-CM | POA: Diagnosis not present

## 2022-08-10 DIAGNOSIS — I110 Hypertensive heart disease with heart failure: Secondary | ICD-10-CM | POA: Insufficient documentation

## 2022-08-10 DIAGNOSIS — M62838 Other muscle spasm: Secondary | ICD-10-CM | POA: Insufficient documentation

## 2022-08-10 DIAGNOSIS — I509 Heart failure, unspecified: Secondary | ICD-10-CM | POA: Insufficient documentation

## 2022-08-10 DIAGNOSIS — Z7901 Long term (current) use of anticoagulants: Secondary | ICD-10-CM | POA: Insufficient documentation

## 2022-08-10 LAB — URINALYSIS, ROUTINE W REFLEX MICROSCOPIC
Bilirubin Urine: NEGATIVE
Glucose, UA: NEGATIVE mg/dL
Hgb urine dipstick: NEGATIVE
Ketones, ur: NEGATIVE mg/dL
Leukocytes,Ua: NEGATIVE
Nitrite: NEGATIVE
Protein, ur: NEGATIVE mg/dL
Specific Gravity, Urine: 1.004 — ABNORMAL LOW (ref 1.005–1.030)
pH: 6 (ref 5.0–8.0)

## 2022-08-10 LAB — CBC
HCT: 47.3 % — ABNORMAL HIGH (ref 36.0–46.0)
Hemoglobin: 15.3 g/dL — ABNORMAL HIGH (ref 12.0–15.0)
MCH: 30.5 pg (ref 26.0–34.0)
MCHC: 32.3 g/dL (ref 30.0–36.0)
MCV: 94.4 fL (ref 80.0–100.0)
Platelets: 177 10*3/uL (ref 150–400)
RBC: 5.01 MIL/uL (ref 3.87–5.11)
RDW: 13.7 % (ref 11.5–15.5)
WBC: 9.9 10*3/uL (ref 4.0–10.5)
nRBC: 0 % (ref 0.0–0.2)

## 2022-08-10 LAB — TROPONIN I (HIGH SENSITIVITY)
Troponin I (High Sensitivity): 6 ng/L (ref <18)
Troponin I (High Sensitivity): 6 ng/L (ref <18)

## 2022-08-10 LAB — CBG MONITORING, ED: Glucose-Capillary: 100 mg/dL — ABNORMAL HIGH (ref 70–99)

## 2022-08-10 LAB — BASIC METABOLIC PANEL
Anion gap: 10 (ref 5–15)
BUN: 16 mg/dL (ref 8–23)
CO2: 26 mmol/L (ref 22–32)
Calcium: 9.2 mg/dL (ref 8.9–10.3)
Chloride: 108 mmol/L (ref 98–111)
Creatinine, Ser: 0.76 mg/dL (ref 0.44–1.00)
GFR, Estimated: >60 mL/min (ref >60)
Glucose, Bld: 93 mg/dL (ref 70–99)
Potassium: 4.3 mmol/L (ref 3.5–5.1)
Sodium: 144 mmol/L (ref 135–145)

## 2022-08-10 LAB — BRAIN NATRIURETIC PEPTIDE: B Natriuretic Peptide: 297.7 pg/mL — ABNORMAL HIGH (ref 0.0–100.0)

## 2022-08-10 MED ORDER — LIDOCAINE-EPINEPHRINE (PF) 2 %-1:200000 IJ SOLN
10.0000 mL | Freq: Once | INTRAMUSCULAR | Status: AC
  Administered 2022-08-10: 10 mL
  Filled 2022-08-10: qty 20

## 2022-08-10 MED ORDER — ACETAMINOPHEN 500 MG PO TABS
1000.0000 mg | ORAL_TABLET | Freq: Once | ORAL | Status: AC
  Administered 2022-08-10: 1000 mg via ORAL
  Filled 2022-08-10: qty 2

## 2022-08-10 NOTE — ED Triage Notes (Signed)
Pt bib [facility] from home where she started to feel dizzy, and shaky this am. Pt took her medication for vertigo and started feeling a little better but then started having left shoulder pain. Pt states the pain is intermittent sharp pains. Pt also states that she just can't stop shaking.

## 2022-08-10 NOTE — ED Notes (Signed)
 EDP at bedside  

## 2022-08-10 NOTE — ED Provider Notes (Signed)
[place] EMERGENCY DEPARTMENT AT [facility] Provider Note   CSN: [id number] Arrival date & time: 08/10/22  1830     History  No chief complaint on file.   Selena [name] is a 87 y.o. female.  Patient is an 87 year old female with a history of vertigo, atrial fibrillation on Eliquis, non-Hodgkin's lymphoma, hypertension, syncope, thoracic aortic aneurysm, CHF, gastric ulcers who is presenting today with complaints of severe scapular pain that has been intermittent since this morning.  Patient reports yesterday was a normal day and this morning when she woke up she felt fine.  She had gone to the kitchen and made some breakfast and as she was sitting talking to her sister on the phone she started having episodes of dizziness where she said everything in her visual field was going in circles which made her feel nauseated.  She was able to get off the phone with her sister finished breakfast and then take her medications.  Shortly before she did that she started getting this intermittent spasmodic type pain in her left posterior shoulder.  She reports its there for 1 to 2 minutes and then resolves but when it is there it is very intense.  Nothing seems to trigger it and it comes and goes whether she is sitting still or moving.  She also reports that makes it feel like her arm is shaking.  The dizzy episode lasted for approximately 20 minutes and resolved.  She has not had any further nausea or dizziness since that time.  She has not had shortness of breath, chest pain, difficulty walking, vision changes, headache, neck pain or falls.  She called her doctor and after telling them the symptoms they recommended she come to the emergency room to make sure she was not having a heart attack.  The history is provided by the patient.       Home Medications Prior to Admission medications   Medication Sig Start Date End Date Taking? Authorizing Provider  amLODipine (NORVASC) 5 MG tablet  Take 1 tablet (5 mg total) by mouth daily. 04/19/22   Kathleene Hazel, MD  apixaban (ELIQUIS) 5 MG TABS tablet TAKE 1 TABLET(5 MG) BY MOUTH TWICE DAILY 04/26/21   Kathleene Hazel, MD  azelastine (ASTELIN) 0.1 % nasal spray Place 2 sprays into both nostrils once for 1 dose. Use in each nostril as directed 03/22/22 03/22/22  Dameron, Nolberto Hanlon, DO  famotidine (PEPCID) 20 MG tablet TAKE 1 TABLET(20 MG) BY MOUTH AT BEDTIME 05/22/22   Arnaldo Natal, NP  metoprolol tartrate (LOPRESSOR) 25 MG tablet Take 1 tablet (25 mg total) by mouth 2 (two) times daily. 04/01/22   Kathleene Hazel, MD  montelukast (SINGULAIR) 10 MG tablet TAKE 1 TABLET(10 MG) BY MOUTH AT BEDTIME 07/20/22   McDiarmid, Leighton Roach, MD  pantoprazole (PROTONIX) 40 MG tablet TAKE 1 TABLET(40 MG) BY MOUTH TWICE DAILY 07/05/22   Kennedy-Smith, Malachi Carl, NP  VENTOLIN HFA 108 (90 Base) MCG/ACT inhaler INHALE 2 PUFFS INTO THE LUNGS EVERY 4 HOURS AS NEEDED FOR WHEEZING OR SHORTNESS OF BREATH 06/27/22   McDiarmid, Leighton Roach, MD      Allergies    Codeine phosphate, Adhesive [tape], and Codeine    Review of Systems   Review of Systems  Physical Exam Updated Vital Signs BP (!) 141/64   Pulse 72   Temp 97.7 F (36.5 C) (Oral)   Resp 13   SpO2 97%  Physical Exam Vitals and nursing note reviewed.  Constitutional:      General: She is not in acute distress.    Appearance: She is well-developed.  HENT:     Head: Normocephalic and atraumatic.  Eyes:     Pupils: Pupils are equal, round, and reactive to light.  Cardiovascular:     Rate and Rhythm: Normal rate. Rhythm irregularly irregular.     Heart sounds: Normal heart sounds. No murmur heard.    No friction rub.  Pulmonary:     Effort: Pulmonary effort is normal.     Breath sounds: Normal breath sounds. No wheezing or rales.  Abdominal:     General: Bowel sounds are normal. There is no distension.     Palpations: Abdomen is soft.     Tenderness: There is no abdominal  tenderness. There is no guarding or rebound.  Musculoskeletal:        General: Tenderness present. Normal range of motion.       Arms:     Comments:   2+ radial pulses in bilateral upper extremities, no swelling to the upper extremities or color change.  Capillary refill less than 2 seconds.  Trace edema bilateral lower extremities  Skin:    General: Skin is warm and dry.     Findings: No rash.  Neurological:     Mental Status: She is alert and oriented to person, place, and time. Mental status is at baseline.     Cranial Nerves: No cranial nerve deficit.     Sensory: No sensory deficit.     Motor: No weakness.     Gait: Gait normal.  Psychiatric:        Behavior: Behavior normal.     ED Results / Procedures / Treatments   Labs (all labs ordered are listed, but only abnormal results are displayed) Labs Reviewed  CBC - Abnormal; Notable for the following components:      Result Value   Hemoglobin 15.3 (*)    HCT 47.3 (*)    All other components within normal limits  URINALYSIS, ROUTINE W REFLEX MICROSCOPIC - Abnormal; Notable for the following components:   Color, Urine STRAW (*)    Specific Gravity, Urine 1.004 (*)    All other components within normal limits  BRAIN NATRIURETIC PEPTIDE - Abnormal; Notable for the following components:   B Natriuretic Peptide 297.7 (*)    All other components within normal limits  CBG MONITORING, ED - Abnormal; Notable for the following components:   Glucose-Capillary 100 (*)    All other components within normal limits  BASIC METABOLIC PANEL  TROPONIN I (HIGH SENSITIVITY)  TROPONIN I (HIGH SENSITIVITY)    EKG EKG Interpretation Date/Time:  Wednesday August 10 2022 18:53:45 EDT Ventricular Rate:  78 PR Interval:    QRS Duration:  82 QT Interval:  388 QTC Calculation: 442 R Axis:   70  Text Interpretation: Atrial fibrillation Low voltage QRS No significant change since last tracing When compared with ECG of 02-Apr-2020 11:31,  PREVIOUS ECG IS PRESENT Confirmed by Gwyneth Sprout ([id number]) on 08/10/2022 7:51:21 PM  Radiology No results found.  Procedures Procedures    Medications Ordered in ED Medications  acetaminophen (TYLENOL) tablet 1,000 mg (1,000 mg Oral Given 08/10/22 2040)  lidocaine-EPINEPHrine (XYLOCAINE W/EPI) 2 %-1:200000 (PF) injection 10 mL (10 mLs Infiltration Given 08/10/22 2053)    ED Course/ Medical Decision Making/ A&P  Medical Decision Making Amount and/or Complexity of Data Reviewed External Data Reviewed: notes. Labs: ordered. Decision-making details documented in ED Course. ECG/medicine tests: ordered and independent interpretation performed. Decision-making details documented in ED Course.  Risk OTC drugs. Prescription drug management.   Pt with multiple medical problems and comorbidities and presenting today with a complaint that caries a high risk for morbidity and mortality.  Here today with posterior shoulder pain.  Possibility for atypical ACS, low suspicion for dissection given patient has normal pulses, reasonable blood pressure, nature of pain is not constant does not radiate.  Low suspicion for PE or upper extremity clot as patient is on Eliquis.  Patient denies any infectious symptoms and low suspicion for pneumonia.  No head or neck symptoms and low suspicion for cervical radiculopathy or stroke or intracranial hemorrhage as the cause of her dizziness earlier.  Patient does have a history of vertigo.  On exam patient has muscle spasm that is palpated and point tenderness.  A trigger point injection was placed and patient reports her pain in her arm was significantly better.  I independently interpreted patient's EKG and labs.  EKG with persistent A-fib but no acute changes, CBC, troponin, BMP and UA are all negative.  BMP without significant change at 297.  Findings discussed with the patient and her family.  At this time there is no indication for  further testing.  Patient was able to ambulate here without any difficulty has no acute deficits and feel that she is stable for discharge home.  No indication for admission at this time.         Final Clinical Impression(s) / ED Diagnoses Final diagnoses:  Spasm of left trapezius muscle    Rx / DC Orders ED Discharge Orders     None         Gwyneth Sprout, MD 08/10/22 2232

## 2022-08-10 NOTE — Discharge Instructions (Signed)
The blood work today was normal including your red blood cell count and your white blood cell count.  All the markers to your heart were also normal.  Your kidneys and electrolytes were good.  No signs of heart attack today or urinary tract infection.  Suspect that you are having a muscle spasm in your shoulder blade.  You can use the lidocaine or Salonpas patches, heating pad and you can take 2 extra strength Tylenol every 6 hours as needed for pain.  If you start having recurrent symptoms of your dizziness you start having persistent vomiting, trouble walking, severe headache or shortness of breath you should return to the emergency room.

## 2022-08-10 NOTE — ED Notes (Signed)
Pt ambulated to bathroom with CNA

## 2022-09-05 ENCOUNTER — Other Ambulatory Visit: Payer: Self-pay | Admitting: Nurse Practitioner

## 2022-09-12 ENCOUNTER — Ambulatory Visit: Payer: Medicare Other

## 2022-09-12 VITALS — Ht 65.0 in | Wt 185.0 lb

## 2022-09-12 DIAGNOSIS — Z0000 Encounter for general adult medical examination without abnormal findings: Secondary | ICD-10-CM | POA: Diagnosis not present

## 2022-09-12 NOTE — Patient Instructions (Signed)
Selena [name] , Thank you for taking time to come for your Medicare Wellness Visit. I appreciate your ongoing commitment to your health goals. Please review the following plan we discussed and let me know if I can assist you in the future.   Referrals/Orders/Follow-Ups/Clinician Recommendations: Aim for 30 minutes of exercise or brisk walking, 6-8 glasses of water, and 5 servings of fruits and vegetables each day.  This is a list of the screening recommended for you and due dates:  Health Maintenance  Topic Date Due   Zoster (Shingles) Vaccine (1 of 2) 10/14/1954   COVID-19 Vaccine (6 - 2023-24 season) 08/18/2022   Medicare Annual Wellness Visit  11/25/2022   DTaP/Tdap/Td vaccine (3 - Td or Tdap) 05/29/2028   Pneumonia Vaccine  Completed   DEXA scan (bone density measurement)  Completed   HPV Vaccine  Aged Out   Colon Cancer Screening  Discontinued    Advanced directives: (In Chart) A copy of your advanced directives are scanned into your chart should your provider ever need it.  Next Medicare Annual Wellness Visit scheduled for next year: Yes  Preventive Care 85 Years and Older, Female Preventive care refers to lifestyle choices and visits with your health care provider that can promote health and wellness. What does preventive care include? A yearly physical exam. This is also called an annual well check. Dental exams once or twice a year. Routine eye exams. Ask your health care provider how often you should have your eyes checked. Personal lifestyle choices, including: Daily care of your teeth and gums. Regular physical activity. Eating a healthy diet. Avoiding tobacco and drug use. Limiting alcohol use. Practicing safe sex. Taking low-dose aspirin every day. Taking vitamin and mineral supplements as recommended by your health care provider. What happens during an annual well check? The services and screenings done by your health care provider during your annual well check will  depend on your age, overall health, lifestyle risk factors, and family history of disease. Counseling  Your health care provider may ask you questions about your: Alcohol use. Tobacco use. Drug use. Emotional well-being. Home and relationship well-being. Sexual activity. Eating habits. History of falls. Memory and ability to understand (cognition). Work and work Astronomer. Reproductive health. Screening  You may have the following tests or measurements: Height, weight, and BMI. Blood pressure. Lipid and cholesterol levels. These may be checked every 5 years, or more frequently if you are over 68 years old. Skin check. Lung cancer screening. You may have this screening every year starting at age 30 if you have a 30-pack-year history of smoking and currently smoke or have quit within the past 15 years. Fecal occult blood test (FOBT) of the stool. You may have this test every year starting at age 11. Flexible sigmoidoscopy or colonoscopy. You may have a sigmoidoscopy every 5 years or a colonoscopy every 10 years starting at age 25. Hepatitis C blood test. Hepatitis B blood test. Sexually transmitted disease (STD) testing. Diabetes screening. This is done by checking your blood sugar (glucose) after you have not eaten for a while (fasting). You may have this done every 1-3 years. Bone density scan. This is done to screen for osteoporosis. You may have this done starting at age 59. Mammogram. This may be done every 1-2 years. Talk to your health care provider about how often you should have regular mammograms. Talk with your health care provider about your test results, treatment options, and if necessary, the need for more tests. Vaccines  Your health care provider may recommend certain vaccines, such as: Influenza vaccine. This is recommended every year. Tetanus, diphtheria, and acellular pertussis (Tdap, Td) vaccine. You may need a Td booster every 10 years. Zoster vaccine. You may  need this after age 14. Pneumococcal 13-valent conjugate (PCV13) vaccine. One dose is recommended after age 39. Pneumococcal polysaccharide (PPSV23) vaccine. One dose is recommended after age 14. Talk to your health care provider about which screenings and vaccines you need and how often you need them. This information is not intended to replace advice given to you by your health care provider. Make sure you discuss any questions you have with your health care provider. Document Released: 02/06/2015 Document Revised: 09/30/2015 Document Reviewed: 11/11/2014 Elsevier Interactive Patient Education  2017 [employer].  Fall Prevention in the Home Falls can cause injuries. They can happen to people of all ages. There are many things you can do to make your home safe and to help prevent falls. What can I do on the outside of my home? Regularly fix the edges of walkways and driveways and fix any cracks. Remove anything that might make you trip as you walk through a door, such as a raised step or threshold. Trim any bushes or trees on the path to your home. Use bright outdoor lighting. Clear any walking paths of anything that might make someone trip, such as rocks or tools. Regularly check to see if handrails are loose or broken. Make sure that both sides of any steps have handrails. Any raised decks and porches should have guardrails on the edges. Have any leaves, snow, or ice cleared regularly. Use sand or salt on walking paths during winter. Clean up any spills in your garage right away. This includes oil or grease spills. What can I do in the bathroom? Use night lights. Install grab bars by the toilet and in the tub and shower. Do not use towel bars as grab bars. Use non-skid mats or decals in the tub or shower. If you need to sit down in the shower, use a plastic, non-slip stool. Keep the floor dry. Clean up any water that spills on the floor as soon as it happens. Remove soap buildup in  the tub or shower regularly. Attach bath mats securely with double-sided non-slip rug tape. Do not have throw rugs and other things on the floor that can make you trip. What can I do in the bedroom? Use night lights. Make sure that you have a light by your bed that is easy to reach. Do not use any sheets or blankets that are too big for your bed. They should not hang down onto the floor. Have a firm chair that has side arms. You can use this for support while you get dressed. Do not have throw rugs and other things on the floor that can make you trip. What can I do in the kitchen? Clean up any spills right away. Avoid walking on wet floors. Keep items that you use a lot in easy-to-reach places. If you need to reach something above you, use a strong step stool that has a grab bar. Keep electrical cords out of the way. Do not use floor polish or wax that makes floors slippery. If you must use wax, use non-skid floor wax. Do not have throw rugs and other things on the floor that can make you trip. What can I do with my stairs? Do not leave any items on the stairs. Make sure that there are  handrails on both sides of the stairs and use them. Fix handrails that are broken or loose. Make sure that handrails are as long as the stairways. Check any carpeting to make sure that it is firmly attached to the stairs. Fix any carpet that is loose or worn. Avoid having throw rugs at the top or bottom of the stairs. If you do have throw rugs, attach them to the floor with carpet tape. Make sure that you have a light switch at the top of the stairs and the bottom of the stairs. If you do not have them, ask someone to add them for you. What else can I do to help prevent falls? Wear shoes that: Do not have high heels. Have rubber bottoms. Are comfortable and fit you well. Are closed at the toe. Do not wear sandals. If you use a stepladder: Make sure that it is fully opened. Do not climb a closed  stepladder. Make sure that both sides of the stepladder are locked into place. Ask someone to hold it for you, if possible. Clearly mark and make sure that you can see: Any grab bars or handrails. First and last steps. Where the edge of each step is. Use tools that help you move around (mobility aids) if they are needed. These include: Canes. Walkers. Scooters. Crutches. Turn on the lights when you go into a dark area. Replace any light bulbs as soon as they burn out. Set up your furniture so you have a clear path. Avoid moving your furniture around. If any of your floors are uneven, fix them. If there are any pets around you, be aware of where they are. Review your medicines with your doctor. Some medicines can make you feel dizzy. This can increase your chance of falling. Ask your doctor what other things that you can do to help prevent falls. This information is not intended to replace advice given to you by your health care provider. Make sure you discuss any questions you have with your health care provider. Document Released: 11/06/2008 Document Revised: 06/18/2015 Document Reviewed: 02/14/2014 Elsevier Interactive Patient Education  2017 [employer].

## 2022-09-12 NOTE — Progress Notes (Signed)
Subjective:   Selena [name] is a 87 y.o. female who presents for Medicare Annual (Subsequent) preventive examination.  Visit Complete: Virtual  I connected with  [name] on 09/12/22 by a audio enabled telemedicine application and verified that I am speaking with the correct person using two identifiers.  Patient Location: Home  Provider Location: Home Office  I discussed the limitations of evaluation and management by telemedicine. The patient expressed understanding and agreed to proceed.  Vital Signs: Because this visit was a virtual/telehealth visit, some criteria may be missing or patient reported. Any vitals not documented were not able to be obtained and vitals that have been documented are patient reported.   Review of Systems     Cardiac Risk Factors include: advanced age (>70men, >58 women);hypertension     Objective:    Today's Vitals   09/12/22 1316  Weight: 185 lb (83.9 kg)  Height: 5\' 5"  (1.651 m)   Body mass index is 30.79 kg/m.     09/12/2022    4:35 PM 08/10/2022    6:36 PM 06/30/2022    3:06 PM 04/26/2022    2:45 PM 03/22/2022   10:29 AM 01/03/2022   10:01 AM 12/14/2021    2:51 PM  Advanced Directives  Does Patient Have a Medical Advance Directive? Yes No Yes Yes Yes Yes Yes  Type of Estate agent of [place];Living will  Healthcare Power of [place];Living will Healthcare Power of [place];Living will Healthcare Power of [place];Living will Healthcare Power of [place];Living will Healthcare Power of [place];Living will  Does patient want to make changes to medical advance directive? No - Patient declined  No - Patient declined No - Patient declined No - Patient declined No - Patient declined No - Patient declined  Copy of Healthcare Power of Attorney in Chart? Yes - validated most recent copy scanned in chart (See row information)  Yes - validated most recent copy scanned in chart (See row information) Yes - validated  most recent copy scanned in chart (See row information) Yes - validated most recent copy scanned in chart (See row information) Yes - validated most recent copy scanned in chart (See row information) Yes - validated most recent copy scanned in chart (See row information)  Would patient like information on creating a medical advance directive?   No - Patient declined No - Patient declined  No - Patient declined No - Patient declined    Current Medications (verified) Outpatient Encounter Medications as of 09/12/2022  Medication Sig   amLODipine (NORVASC) 5 MG tablet Take 1 tablet (5 mg total) by mouth daily.   apixaban (ELIQUIS) 5 MG TABS tablet TAKE 1 TABLET(5 MG) BY MOUTH TWICE DAILY   famotidine (PEPCID) 20 MG tablet TAKE 1 TABLET(20 MG) BY MOUTH AT BEDTIME   metoprolol tartrate (LOPRESSOR) 25 MG tablet Take 1 tablet (25 mg total) by mouth 2 (two) times daily.   montelukast (SINGULAIR) 10 MG tablet TAKE 1 TABLET(10 MG) BY MOUTH AT BEDTIME   pantoprazole (PROTONIX) 40 MG tablet TAKE 1 TABLET(40 MG) BY MOUTH TWICE DAILY   VENTOLIN HFA 108 (90 Base) MCG/ACT inhaler INHALE 2 PUFFS INTO THE LUNGS EVERY 4 HOURS AS NEEDED FOR WHEEZING OR SHORTNESS OF BREATH   azelastine (ASTELIN) 0.1 % nasal spray Place 2 sprays into both nostrils once for 1 dose. Use in each nostril as directed (Patient not taking: Reported on 09/12/2022)   No facility-administered encounter medications on file as of 09/12/2022.    Allergies (  verified) Codeine phosphate, Adhesive [tape], and Codeine   History: Past Medical History:  Diagnosis Date   Acute gastric ulcer with hemorrhage    Acute on chronic diastolic congestive heart failure ([name]) 10/16/2018   AKI (acute kidney injury) ([name])    Anemia    PMH   Aneurysm (arteriovenous) of coronary vessels    Ankle edema, bilateral 01/08/2021   Atrial fibrillation ([name]) 01/01/2007   BACK PAIN, UPPER 07/09/2008   Breast cancer of upper-outer quadrant of right female breast ([name])  03/21/2011   Breast cancer, stage 1 ([name])    Bruises easily    Cataract    history   Complication of anesthesia    COVID-19 virus infection 03/08/2020   Diverticulitis    DIVERTICULOSIS, COLON 12/26/2006   Essential hypertension 10/27/2017   Follicular 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 ([name]) 07/31/2014   Follicular non-Hodgkin's lymphoma ([name])    Frozen shoulder, Left 01/08/2021   Full dentures 01/08/2021   Goiter    multi-nodular   Gustatory rhinitis 01/07/2021   Hemorrhoids 04/08/2021   History of atrioventricular nodal ablation 10/16/2018   Hx of colonoscopy 2009   Incontinence of feces 04/08/2021   Influenza 12/18/2020   Insomnia 09/10/2014   LIVER FUNCTION TESTS, ABNORMAL, HX OF 12/26/2007   Macular degeneration, bilateral 01/08/2021   Memory loss 05/16/2007   Mild cognitive impairment 09/23/2021   Qualifier: Diagnosis of  By: Amador Cunas  MD, Janett Labella    Multiple gastric ulcers 2021   CC Hematemesis; EGD showed mild abnormalities in the esophagus and multiple gastric ulcers   Nocturnal hypoxemia 05/14/2020   OSTEOARTHRITIS 12/26/2006   takes Diclofenac daily but has stopped for surgery   Osteoarthritis 12/26/2006   Qualifier: Diagnosis of  By: Marcelyn Ditty, RN, Judy Dewayne Hatch    Paraspinal mass 07/16/2014   Pneumonia 11/11/2017   Pneumonia 01/31/2018   Pneumonia due to COVID-19 virus 03/08/2020   PONV (postoperative nausea and vomiting)    S/P ablation of atrial flutter 07/17/2018   S/P ablation of atrial flutter 07/17/2018   Sensorineural hearing loss (SNHL) of both ears 01/07/2021   Sepsis ([name]) 04/02/2020   Shortness of breath 05/28/2019   Skin cancer    Syncope 08/21/2018   Thoracic aortic aneurysm ([name]) 10/16/2018   Tibialis posterior tendinopathy 04/08/2019   Upper airway cough syndrome 10/27/2017   pfts 10/10/17 s sign airflow obst though the insp loop is somewhat flattened  - Allergy profile 10/26/2017 >  Eos 0.3 /  IgE  6 RAST neg - rx for gerd 10/26/17 >  improved 12/07/2017    Urinary frequency 03/22/2022   Vasomotor rhinitis 01/08/2021   VERTIGO 09/07/2009   Has had Vestibular Rehab consultation   Visual impairment 01/07/2021   Wears dentures    Wears glasses    Wears hearing aid    bilateral   Past Surgical History:  Procedure Laterality Date   ABDOMINAL HYSTERECTOMY     ABLATION OF DYSRHYTHMIC FOCUS  2009   APPENDECTOMY     AUGMENTATION MAMMAPLASTY Right 2013   Subpectoral implant   BIOPSY  05/07/2019   Procedure: BIOPSY;  Surgeon: Lemar Lofty., MD;  Location: [facility];  Service: Gastroenterology;;   BREAST BIOPSY  2013   BREAST RECONSTRUCTION  04/14/2011   Procedure: BREAST RECONSTRUCTION;  Surgeon: Etter Sjogren, MD;  Location: [facility] OR;  Service: Plastics;  Laterality: Right;  Placement of Right Breast Tissue Expander with  use of Flex HD for Breast Reconstruction   BREAST SURGERY  right sm, snbx   CATARACT EXTRACTION W/ INTRAOCULAR LENS  IMPLANT, BILATERAL  2010   bilateral   COLONOSCOPY     COLONOSCOPY     ESOPHAGOGASTRODUODENOSCOPY (EGD) WITH PROPOFOL N/A 05/07/2019   Procedure: ESOPHAGOGASTRODUODENOSCOPY (EGD) WITH PROPOFOL;  Surgeon: Meridee Score Netty Starring., MD;  Location: [facility];  Service: Gastroenterology;  Laterality: N/A;   KNEE SURGERY  2004/2010   arthroscopic/ bilateral knee replacements   LOOP RECORDER IMPLANT     MASTECTOMY Right 2013   PORT-A-CATH REMOVAL N/A 12/07/2016   Procedure: REMOVAL PORT-A-CATH;  Surgeon: Emelia Loron, MD;  Location: [facility] OR;  Service: General;  Laterality: N/A;   PORTACATH PLACEMENT Right 08/11/2014   Procedure: INSERTION PORT-A-CATH WITH ULTRASOUND;  Surgeon: Emelia Loron, MD;  Location: [facility] OR;  Service: General;  Laterality: Right;   ROTATOR CUFF REPAIR  1999   right   tmi  1998   TONSILLECTOMY     TOTAL KNEE ARTHROPLASTY Bilateral    Family History  Problem Relation Age of Onset   Colon cancer Mother    Cancer Mother        colon   Colon  cancer Maternal Aunt    Cancer Maternal Uncle    Prostate cancer Maternal Uncle    Prostate cancer Maternal Uncle    Other Maternal Uncle    Cancer Sister        kidney   Cancer Brother        lymph node   Anesthesia problems Neg Hx    Hypotension Neg Hx    Malignant hyperthermia Neg Hx    Pseudochol deficiency Neg Hx    Breast cancer Neg Hx    Social History   Socioeconomic History   Marital status: Widowed    Spouse name: Not on file   Number of children: 2   Years of education: 33   Highest education level: High school graduate  Occupational History   Occupation: retired  Tobacco Use   Smoking status: Former    Current packs/day: 0.00    Average packs/day: 0.1 packs/day for 20.0 years (2.0 ttl pk-yrs)    Types: Cigarettes    Start date: 01/24/1958    Quit date: 01/24/1978    Years since quitting: 44.6    Passive exposure: Past   Smokeless tobacco: Never  Vaping Use   Vaping status: Never Used  Substance and Sexual Activity   Alcohol use: No   Drug use: No   Sexual activity: Not Currently    Birth control/protection: Surgical  Other Topics Concern   Not on file  Social History Narrative   Mrs Josey's two children live beside her, son and daughter.   She worked at [employer]   She has a walker, cane, BSC, and lift chair at home.   Social Determinants of Health   Financial Resource Strain: Low Risk  (09/12/2022)   Overall Financial Resource Strain (CARDIA)    Difficulty of Paying Living Expenses: Not hard at all  Food Insecurity: No Food Insecurity (09/12/2022)   Hunger Vital Sign    Worried About Running Out of Food in the Last Year: Never true    Ran Out of Food in the Last Year: Never true  Transportation Needs: No Transportation Needs (09/12/2022)   PRAPARE - Administrator, Civil Service (Medical): No    Lack of Transportation (Non-Medical): No  Physical Activity: Sufficiently Active (09/12/2022)   Exercise Vital Sign    Days of Exercise per  Week: 6 days  Minutes of Exercise per Session: 30 min  Stress: No Stress Concern Present (09/12/2022)   [employer] of Occupational Health - Occupational Stress Questionnaire    Feeling of Stress : Not at all  Social Connections: Moderately Integrated (09/12/2022)   Social Connection and Isolation Panel [NHANES]    Frequency of Communication with Friends and Family: More than three times a week    Frequency of Social Gatherings with Friends and Family: Three times a week    Attends Religious Services: More than 4 times per year    Active Member of Clubs or Organizations: Yes    Attends Banker Meetings: More than 4 times per year    Marital Status: Widowed    Tobacco Counseling Counseling given: Not Answered   Clinical Intake:  Pre-visit preparation completed: Yes  Pain : No/denies pain     Diabetes: No  How often do you need to have someone help you when you read instructions, pamphlets, or other written materials from your doctor or pharmacy?: 1 - Never  Interpreter Needed?: No  Information entered by :: Kandis Fantasia LPN   Activities of Daily Living    09/12/2022    4:34 PM 11/24/2021    2:20 PM  In your present state of health, do you have any difficulty performing the following activities:  Hearing? 0 0  Vision? 0 0  Difficulty concentrating or making decisions? 0 0  Walking or climbing stairs? 0 0  Dressing or bathing? 0 0  Doing errands, shopping? 0 0  Preparing Food and eating ? N N  Using the Toilet? N N  In the past six months, have you accidently leaked urine? N N  Do you have problems with loss of bowel control? N N  Managing your Medications? N N  Managing your Finances? N N  Housekeeping or managing your Housekeeping? N N    Patient Care Team: McDiarmid, Leighton Roach, MD as PCP - General (Family Medicine) Kathleene Hazel, MD as PCP - Cardiology (Cardiology) Tomma Lightning, MD as Consulting Physician (Pulmonary  Disease) Kathryne Hitch, MD as Consulting Physician (Orthopedic Surgery) Serena Croissant, MD as Consulting Physician (Hematology and Oncology) [facility], Konrad Dolores, MD as Consulting Physician (Endocrinology) Arnaldo Natal, NP as Nurse Practitioner (Gastroenterology) Marinus Maw, MD as Consulting Physician (Cardiology) Emelia Loron, MD as Consulting Physician (General Surgery)  Indicate any recent Medical Services you may have received from other than [facility] providers in the past year (date may be approximate).     Assessment:   This is a routine wellness examination for Caylie.  Hearing/Vision screen Hearing Screening - Comments:: Hard of hearing   Vision Screening - Comments:: Wears rx glasses - up to date with routine eye exams with Dr. Lelan Pons    Dietary issues and exercise activities discussed:     Goals Addressed               This Visit's Progress     COMPLETED: Patient Stated (pt-stated)        Stay healthy      COMPLETED: Patient Stated        Get nose to stop running       Remain active and independent         Depression Screen    09/12/2022    1:28 PM 06/23/2022   10:57 AM 03/22/2022   10:29 AM 11/24/2021    2:16 PM 10/28/2021    1:27 PM 10/19/2021    1:39  PM 09/23/2021    3:09 PM  PHQ 2/9 Scores  PHQ - 2 Score 0 0  0 0 3 0  PHQ- 9 Score  5  0 6 9   Exception Documentation   Patient refusal        Fall Risk    09/12/2022    4:34 PM 06/23/2022   10:57 AM 03/22/2022   10:29 AM 11/24/2021    2:21 PM 09/23/2021    1:34 PM  Fall Risk   Falls in the past year? 0 0 0 0 0  Number falls in past yr: 0 0 0 0 0  Injury with Fall? 0 0 0 0 0  Risk for fall due to : Impaired balance/gait   No Fall Risks Impaired balance/gait  Follow up Falls prevention discussed;Education provided;Falls evaluation completed   Falls prevention discussed Falls evaluation completed    MEDICARE RISK AT HOME: Medicare Risk at Home Any stairs in or  around the home?: No If so, are there any without handrails?: No Home free of loose throw rugs in walkways, pet beds, electrical cords, etc?: Yes Adequate lighting in your home to reduce risk of falls?: Yes Life alert?: No Use of a cane, walker or w/c?: Yes Grab bars in the bathroom?: Yes Shower chair or bench in shower?: No Elevated toilet seat or a handicapped toilet?: Yes  TIMED UP AND GO:  Was the test performed?  No    Cognitive Function:      09/23/2021    3:14 PM 01/08/2021   11:00 AM  Montreal Cognitive Assessment   Visuospatial/ Executive (0/5) -- 3  Naming (0/3) -- 2  Attention: Read list of digits (0/2) 0 2  Attention: Read list of letters (0/1) 1 1  Attention: Serial 7 subtraction starting at 100 (0/3) 2 0  Language: Repeat phrase (0/2) 0 0  Language : Fluency (0/1) 0 0  Abstraction (0/2) 1 0  Delayed Recall (0/5) 3 3  Orientation (0/6) 6 6  Total  17  Adjusted Score (based on education)  18      09/12/2022    4:34 PM 11/24/2021    2:22 PM 11/11/2020    3:12 PM 11/11/2020    1:58 PM 11/05/2019    2:27 PM  6CIT Screen  What Year? 0 points 0 points 0 points 0 points 0 points  What month? 0 points 0 points 0 points 0 points 0 points  What time? 0 points 0 points 0 points 0 points   Count back from 20 0 points 0 points 0 points 0 points 0 points  Months in reverse 2 points 0 points 4 points 4 points 4 points  Repeat phrase 0 points 2 points 8 points 8 points 4 points  Total Score 2 points 2 points 12 points 12 points     Immunizations Immunization History  Administered Date(s) Administered   COVID-19, mRNA, vaccine(Comirnaty)12 years and older 06/23/2022   Fluad Quad(high Dose 65+) 09/26/2018   Influenza Split 10/20/2011   Influenza Whole 11/07/2006   Influenza, High Dose Seasonal PF 10/13/2016, 10/12/2017   Influenza-Unspecified 09/07/2013, 10/15/2015, 09/26/2018, 10/01/2019   Moderna Sars-Covid-2 Vaccination 02/04/2019, 03/04/2019, 12/24/2019    Pfizer Covid-19 Vaccine Bivalent Booster 55yrs & up 01/07/2021   Pneumococcal Conjugate-13 08/26/2013   Pneumococcal Polysaccharide-23 10/29/2007, 08/28/2017   Td 05/27/2008   Tdap 05/30/2018   Zoster, Live 10/29/2007    TDAP status: Up to date  Flu Vaccine status: Up to date  Pneumococcal vaccine status: Up  to date  Covid-19 vaccine status: Information provided on how to obtain vaccines.   Qualifies for Shingles Vaccine? Yes   Zostavax completed No   Shingrix Completed?: No.    Education has been provided regarding the importance of this vaccine. Patient has been advised to call insurance company to determine out of pocket expense if they have not yet received this vaccine. Advised may also receive vaccine at local pharmacy or Health Dept. Verbalized acceptance and understanding.  Screening Tests Health Maintenance  Topic Date Due   Zoster Vaccines- Shingrix (1 of 2) 10/14/1954   COVID-19 Vaccine (6 - 2023-24 season) 08/18/2022   Medicare Annual Wellness (AWV)  09/12/2023   DTaP/Tdap/Td (3 - Td or Tdap) 05/29/2028   Pneumonia Vaccine 33+ Years old  Completed   DEXA SCAN  Completed   HPV VACCINES  Aged Out   Colonoscopy  Discontinued    Health Maintenance  Health Maintenance Due  Topic Date Due   Zoster Vaccines- Shingrix (1 of 2) 10/14/1954   COVID-19 Vaccine (6 - 2023-24 season) 08/18/2022    Colorectal cancer screening: No longer required.   Mammogram status: No longer required due to age and preference.  Bone Density status: Completed 06/24/13. Results reflect: Bone density results: NORMAL. Repeat every 5 years.  Lung Cancer Screening: (Low Dose CT Chest recommended if Age 24-80 years, 20 pack-year currently smoking OR have quit w/in 15years.) does not qualify.   Lung Cancer Screening Referral: n/a  Additional Screening:  Hepatitis C Screening: does not qualify  Vision Screening: Recommended annual ophthalmology exams for early detection of glaucoma and other  disorders of the eye. Is the patient up to date with their annual eye exam?  Yes  Who is the provider or what is the name of the office in which the patient attends annual eye exams? Dr. Lelan Pons If pt is not established with a provider, would they like to be referred to a provider to establish care? No .   Dental Screening: Recommended annual dental exams for proper oral hygiene  Community Resource Referral / Chronic Care Management: CRR required this visit?  No   CCM required this visit?  No     Plan:     I have personally reviewed and noted the following in the patient's chart:   Medical and social history Use of alcohol, tobacco or illicit drugs  Current medications and supplements including opioid prescriptions. Patient is not currently taking opioid prescriptions. Functional ability and status Nutritional status Physical activity Advanced directives List of other physicians Hospitalizations, surgeries, and ER visits in previous 12 months Vitals Screenings to include cognitive, depression, and falls Referrals and appointments  In addition, I have reviewed and discussed with patient certain preventive protocols, quality metrics, and best practice recommendations. A written personalized care plan for preventive services as well as general preventive health recommendations were provided to patient.     Kandis Fantasia [place], [place]   0/27/2536   After Visit Summary: (MyChart) Due to this being a telephonic visit, the after visit summary with patients personalized plan was offered to patient via MyChart   Nurse Notes: Patient would like to know if anything else can be done to help with sinus symptoms of cough and drainage that has been ongoing for a few months.

## 2022-09-25 ENCOUNTER — Other Ambulatory Visit: Payer: Self-pay | Admitting: Nurse Practitioner

## 2022-10-18 ENCOUNTER — Other Ambulatory Visit: Payer: Self-pay | Admitting: Student

## 2022-11-02 ENCOUNTER — Other Ambulatory Visit: Payer: Self-pay | Admitting: Family Medicine

## 2022-11-02 DIAGNOSIS — J849 Interstitial pulmonary disease, unspecified: Secondary | ICD-10-CM

## 2022-11-28 DIAGNOSIS — M25561 Pain in right knee: Secondary | ICD-10-CM

## 2022-11-28 HISTORY — DX: Pain in right knee: M25.561

## 2022-12-25 ENCOUNTER — Other Ambulatory Visit: Payer: Self-pay | Admitting: Nurse Practitioner

## 2023-01-03 ENCOUNTER — Other Ambulatory Visit: Payer: Self-pay | Admitting: Family Medicine

## 2023-01-03 DIAGNOSIS — Z1231 Encounter for screening mammogram for malignant neoplasm of breast: Secondary | ICD-10-CM

## 2023-01-16 ENCOUNTER — Ambulatory Visit
Admission: RE | Admit: 2023-01-16 | Discharge: 2023-01-16 | Disposition: A | Discharge status: Home / Self Care | Payer: Medicare Other | Source: Ambulatory Visit | Attending: Family Medicine | Admitting: Family Medicine

## 2023-01-16 ENCOUNTER — Ambulatory Visit: Payer: Medicare Other | Admitting: Family Medicine

## 2023-01-16 ENCOUNTER — Encounter: Payer: Self-pay | Admitting: Family Medicine

## 2023-01-16 VITALS — BP 130/66 | HR 79 | Ht 65.0 in | Wt 181.2 lb

## 2023-01-16 DIAGNOSIS — J189 Pneumonia, unspecified organism: Secondary | ICD-10-CM | POA: Insufficient documentation

## 2023-01-16 DIAGNOSIS — J069 Acute upper respiratory infection, unspecified: Secondary | ICD-10-CM

## 2023-01-16 LAB — POC SOFIA 2 FLU + SARS ANTIGEN FIA
Influenza A, POC: NEGATIVE
Influenza B, POC: NEGATIVE
SARS Coronavirus 2 Ag: NEGATIVE

## 2023-01-16 MED ORDER — CEFDINIR 300 MG PO CAPS
300.0000 mg | ORAL_CAPSULE | Freq: Two times a day (BID) | ORAL | 0 refills | Status: DC
Start: 2023-01-16 — End: 2023-04-27

## 2023-01-16 MED ORDER — DOXYCYCLINE HYCLATE 100 MG PO TABS
100.0000 mg | ORAL_TABLET | Freq: Two times a day (BID) | ORAL | 0 refills | Status: DC
Start: 2023-01-16 — End: 2023-04-27

## 2023-01-16 NOTE — Assessment & Plan Note (Addendum)
Cough and congestion with clear sputum and phelgm for ~ one week. Flu and COVID negative. VSS without hypoxia. Lung exam with crackles at left base. Given patients risk factors and lung exam, will treat for CAP. Patients vital signs are stable, less concerned for severity requiring inpatient treatment.  -doxycycline 100mg  BID and omnicef 300 mg BID for 5 days - Chest xray - CBC and BMP

## 2023-01-16 NOTE — Patient Instructions (Addendum)
It was great to see you today! Thank you for choosing [facility] for your primary care. Selena [name] was seen for cough, congestion. We think this may be pneumonia. We will plan to treat you with antibiotics given your risk factors. Please get a chest xray as described below. You will take two medications for 5 days.   If you haven't already, sign up for My Chart to have easy access to your labs results, and communication with your primary care physician.  We are checking some labs today. If they are abnormal, I will call you. If they are normal, I will send you a MyChart message (if it is active) or a letter in the mail. If you do not hear about your labs in the next 2 weeks, please call the office.   An x-ray was ordered for you---you do not need an appointment to have this completed.  I recommend going to [facility] [address] [place] This is walk in from 8 AM to 5 PM  Please call the clinic at [phone] if your symptoms worsen or you have any concerns.  Thank you for allowing me to participate in your care, Hal Morales, MD 01/16/2023, 11:32 AM PGY-1, [facility]

## 2023-01-16 NOTE — Progress Notes (Signed)
    SUBJECTIVE:   CHIEF COMPLAINT / HPI:   Started feeling sick 12/17 with cough and congestion. Has been having rhinorrhea and phlegm build up with chills though no fever. Phlegm and rhinorrhea are clear. No sick contacts. Does have shortness of breath and feeling like her chest is tight.   No chest pain.    PERTINENT  PMH / PSH:  Restrictive lung disease- daily inhaler A fib OSA of CPAP   OBJECTIVE:   BP 130/66   Pulse 79   Ht 5\' 5"  (1.651 m)   Wt 181 lb 3.2 oz (82.2 kg)   SpO2 98%   BMI 30.15 kg/m   General: A&O, NAD, well appearing HEENT: No sign of trauma, normocephalic Cardiac: regular rate and rhythm, no m/r/g Respiratory: no decreased breath sounds, mild crackles at left lung base, NWOB on RA  GI: Soft, NTTP, non-distended  Extremities: NTTP, no peripheral edema. Neuro: Normal gait, moves all four extremities appropriately. Ambulating with cane  Psych: Appropriate mood and affect   ASSESSMENT/PLAN:   Pneumonia Cough and congestion with clear sputum and phelgm for ~ one week. Flu and COVID negative. VSS without hypoxia. Lung exam with crackles at left base. Given patients risk factors and lung exam, will treat for CAP. Patients vital signs are stable, less concerned for severity requiring inpatient treatment.  -doxycycline 100mg  BID and omnicef 300 mg BID for 5 days - Chest xray - CBC and BMP   Hal Morales, MD [facility]

## 2023-01-17 ENCOUNTER — Telehealth: Payer: Self-pay | Admitting: Family Medicine

## 2023-01-17 LAB — BASIC METABOLIC PANEL
BUN/Creatinine Ratio: 15 (ref 12–28)
BUN: 11 mg/dL (ref 8–27)
CO2: 24 mmol/L (ref 20–29)
Calcium: 9.1 mg/dL (ref 8.7–10.3)
Chloride: 101 mmol/L (ref 96–106)
Creatinine, Ser: 0.73 mg/dL (ref 0.57–1.00)
Glucose: 90 mg/dL (ref 70–99)
Potassium: 4.2 mmol/L (ref 3.5–5.2)
Sodium: 142 mmol/L (ref 134–144)
eGFR: 81 mL/min/{1.73_m2} (ref >59)

## 2023-01-17 LAB — CBC WITH DIFFERENTIAL/PLATELET
Basophils Absolute: 0.1 10*3/uL (ref 0.0–0.2)
Basos: 1 %
EOS (ABSOLUTE): 0.4 10*3/uL (ref 0.0–0.4)
Eos: 2 %
Hematocrit: 43.6 % (ref 34.0–46.6)
Hemoglobin: 14.4 g/dL (ref 11.1–15.9)
Immature Grans (Abs): 0.1 10*3/uL (ref 0.0–0.1)
Immature Granulocytes: 1 %
Lymphocytes Absolute: 1.5 10*3/uL (ref 0.7–3.1)
Lymphs: 10 %
MCH: 31.2 pg (ref 26.6–33.0)
MCHC: 33 g/dL (ref 31.5–35.7)
MCV: 94 fL (ref 79–97)
Monocytes Absolute: 1.5 10*3/uL — ABNORMAL HIGH (ref 0.1–0.9)
Monocytes: 11 %
Neutrophils Absolute: 11.1 10*3/uL — ABNORMAL HIGH (ref 1.4–7.0)
Neutrophils: 75 %
Platelets: 268 10*3/uL (ref 150–450)
RBC: 4.62 x10E6/uL (ref 3.77–5.28)
RDW: 12.4 % (ref 11.7–15.4)
WBC: 14.6 10*3/uL — ABNORMAL HIGH (ref 3.4–10.8)

## 2023-01-17 NOTE — Telephone Encounter (Signed)
Called patient to discuss CXR and CBC results. CXR clear but CBC did show leukocytosis with neutrophilic dominance. Will continue treatment for pneumonia. Patient expressed understanding

## 2023-01-31 ENCOUNTER — Other Ambulatory Visit: Payer: Self-pay | Admitting: Family Medicine

## 2023-01-31 ENCOUNTER — Telehealth: Payer: Self-pay | Admitting: Cardiovascular Disease

## 2023-01-31 ENCOUNTER — Other Ambulatory Visit: Payer: Self-pay | Admitting: *Deleted

## 2023-01-31 DIAGNOSIS — J849 Interstitial pulmonary disease, unspecified: Secondary | ICD-10-CM

## 2023-01-31 DIAGNOSIS — I4821 Permanent atrial fibrillation: Secondary | ICD-10-CM

## 2023-01-31 MED ORDER — APIXABAN 5 MG PO TABS
ORAL_TABLET | ORAL | 1 refills | Status: DC
Start: 2023-01-31 — End: 2023-11-27

## 2023-01-31 NOTE — Telephone Encounter (Signed)
*  STAT* If patient is at the pharmacy, call can be transferred to refill team.   1. Which medications need to be refilled? (please list name of each medication and dose if known) apixaban  (ELIQUIS ) 5 MG TABS tablet    2. Would you like to learn more about the convenience, safety, & potential cost savings by using the [facility]? No   3. Are you open to using the [facility] (Type [facility]. ) No   4. Which pharmacy/location (including street and city if local pharmacy) is medication to be sent to?[id number] - , [place] - [address]    5. Do they need a 30 day or 90 day supply? 90 day   Pt has scheduled appt on 02/20/23

## 2023-01-31 NOTE — Telephone Encounter (Signed)
 Eliquis  5mg  refill request received. Patient is 88 years old, weight-82.2kg, Crea-0.73 on 01/16/23, Diagnosis-Afib, and last seen by Rosaline Bane on 01/27/22 and has an appt scheduled on 02/20/23. Dose is appropriate based on dosing criteria. Will send in refill to requested pharmacy.

## 2023-02-06 ENCOUNTER — Ambulatory Visit
Admission: RE | Admit: 2023-02-06 | Discharge: 2023-02-06 | Disposition: A | Discharge status: Home / Self Care | Payer: Medicare Other | Source: Ambulatory Visit | Attending: Family Medicine | Admitting: Family Medicine

## 2023-02-06 DIAGNOSIS — Z1231 Encounter for screening mammogram for malignant neoplasm of breast: Secondary | ICD-10-CM

## 2023-02-18 NOTE — Progress Notes (Unsigned)
Cardiology Office Note:  .   Date:  02/20/2023  ID:  [name], DOB 02/01/1935, MRN [id number] PCP: McDiarmid, Leighton Roach, MD  [facility] Providers Cardiologist:  Verne Carrow, MD    Patient Profile: .      PMH Persistent atrial fibrillation on chronic anticoagulation OSA on CPAP Hypertension Breast cancer Non-Hodgkin's lymphoma ILD Thoracic aortic aneurysm  Remote atrial flutter in 2008 for which she had ablation.  She had breast cancer March 2013 and was treated with right mastectomy and chemotherapy.  Seen in [facility] ED August 2019 with c/o dyspnea for 2 months.  D-dimer was elevated.  Chest CT was negative for PE.  There was a 4.2 cm thoracic aortic aneurysm with no evidence of dissection.  No underlying lung disease.  Echo August 2019 with mild LVH, normal LVEF 55 to 60%, G2 DD.  Cardiac monitor February 2020 with PACs, PVCs, and SVT.  She had a syncopal event in June 2020.  Was seen by Dr. Ladona Ridgel and had ILR implanted August 2020.  She was found to have atrial fibrillation and was started on Eliquis.  ILR was turned off due to cost of follow-up.  She had bright red emesis in 2021 and was admitted with EGD that showed mild abnormalities in the esophagus and multiple gastric ulcers.  She was started on PPI.  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  Recommendation to monitor.  Last cardiology clinic visit was with me on 01/27/2022.  Her brother was with her due to her concern with driving due to visual problems.  Reported feeling very well.  She continued to have chronic cough.  Reported she would start the night with CPAP but sometimes coughing would cause her to have to remove it.  Occasional dizziness that she associated with vertigo.  Her granddaughter is a physician  and was able to relieve her symptoms with manipulation.  Home BP typically 125-130 over 70s.  She had chronic DOE and bilateral LE edema that she felt were stable.  She was taking Lasix as needed.  Unfortunately, she had recently learned that her breast cancer had returned.  She was encouraged to follow-up in 9 months.       History of Present Illness: .   Selena [name] is a very pleasant 88 y.o. female who is here today for follow-up. She no longer drives due to blurry vision, says she can see me but cannot make out the features of my face. Reports she is doing well. She recovered from pneumonia that occurred around Christmas and has since been feeling great. She maintains an active lifestyle, walking a mile each day, either outdoors or indoors.  She continues to live alone.  Has occasional episodes of vertigo, which led to a fall in October. She manages her mobility issues by taking things slow, especially when getting up from a seated or lying position. She also uses a flashlight to aid her vision when necessary. She still has ILR implanted and asks about having it removed. She expresses concern about the monitor's position and whether it could potentially move towards the heart. Occasional leg swelling, which she manages by elevating her legs when sitting and wearing compression socks. We discussed prior monitoring of cholesterol which has not been done recently. She would like to ensure that her cholesterol is well  controlled. She denies chest pain, shortness of breath, melena, hematuria, presyncope, syncope, orthopnea, and PND.   Discussed the use of AI scribe software for clinical note transcription with the patient, who gave verbal consent to proceed.   ROS: See HPI       Studies Reviewed: .        Risk Assessment/Calculations:    CHA2DS2-VASc Score = 6   This indicates a 9.7% annual risk of stroke. The patient's score is based upon: CHF History: 1 HTN History: 1 Diabetes History:  0 Stroke History: 0 Vascular Disease History: 1 Age Score: 2 Gender Score: 1            Physical Exam:   VS:  BP (!) 112/54   Pulse 78   Ht 5\' 5"  (1.651 m)   Wt 179 lb (81.2 kg)   SpO2 98%   BMI 29.79 kg/m    Wt Readings from Last 3 Encounters:  02/20/23 179 lb (81.2 kg)  01/16/23 181 lb 3.2 oz (82.2 kg)  09/12/22 185 lb (83.9 kg)    GEN: Well nourished, well developed in no acute distress NECK: No JVD; No carotid bruits CARDIAC: Irregular RR, no murmurs, rubs, gallops RESPIRATORY:  Clear to auscultation without rales, wheezing or rhonchi  ABDOMEN: Soft, non-tender, non-distended EXTREMITIES:  No edema; No deformity     ASSESSMENT AND PLAN: .    Persistent atrial fibrillation on chronic anticoagulation: Heart rate is irregular on exam, with well-controlled ventricular rate at 78 bpm. She reports other providers frequently noting irregular HR. Advised that two EKGs in the last year have both shown A-fib which may now be permanent.  She is not interested in pursuing additional therapy at this time.  We will continue metoprolol for rate control.  No bleeding concerns. Continue Eliquis 5 mg twice daily which is appropriate dose for stroke prevention for CHA2DS2-VASc score of 6. Is concerned that ILR could travel to her heart, I have assured her that it will not. She will let us know if she would like to have it removed.   Hypertension: BP is well-controlled.  We had a lengthy discussion about dizziness which she thinks is secondary to vertigo.  Dizziness does not occur with every position change. I encouraged her to consider taking a lower dose of amlodipine, however she does not want to make that change at this time. If dizziness persists, I encouraged her to let us know. Would recommend decreasing amlodipine to 2.5 mg daily.    Dizziness: Occasional. Reports long history of vertigo which she thinks is the cause. She takes care when standing up. No recent increase in frequency or  severity. Continue current management, consider reducing Amlodipine dose if symptoms worsen.  Aortic atherosclerosis/Coronary calcification noted on CT: Chest CT 06/29/2022 revealed mild coronary artery calcification and aortic atherosclerosis. She denies chest pain, dyspnea, or other symptoms concerning for angina. No indication for further ischemic evaluation at this time.  We discussed goal for LDL 70 or lower.  She would like to have her cholesterol rechecked and is willing to consider lipid-lowering therapy if LDL remains elevated.  Hyperlipidemia LDL goal < 70: No lipid panel to review since 2022.  We will get lipid panel along with direct LDL today.  She is agreeable to consider lipid-lowering therapy if LDL still above goal.  Aortic dilatation: Aorta reported as normal size on CT 06/2022 and on echo 11/07/2021. No indication for further imaging at this time.  Disposition:1 year with Dr. Clifton James  Signed, Eligha Bridegroom, NP-C

## 2023-02-20 ENCOUNTER — Encounter: Payer: Self-pay | Admitting: Nurse Practitioner

## 2023-02-20 ENCOUNTER — Ambulatory Visit: Payer: Medicare Other | Attending: Nurse Practitioner | Admitting: Nurse Practitioner

## 2023-02-20 VITALS — BP 112/54 | HR 78 | Ht 65.0 in | Wt 179.0 lb

## 2023-02-20 DIAGNOSIS — I4819 Other persistent atrial fibrillation: Secondary | ICD-10-CM

## 2023-02-20 DIAGNOSIS — R42 Dizziness and giddiness: Secondary | ICD-10-CM

## 2023-02-20 DIAGNOSIS — Z7901 Long term (current) use of anticoagulants: Secondary | ICD-10-CM | POA: Diagnosis not present

## 2023-02-20 DIAGNOSIS — I700 Atherosclerosis of aorta: Secondary | ICD-10-CM

## 2023-02-20 DIAGNOSIS — E785 Hyperlipidemia, unspecified: Secondary | ICD-10-CM

## 2023-02-20 DIAGNOSIS — I2510 Atherosclerotic heart disease of native coronary artery without angina pectoris: Secondary | ICD-10-CM

## 2023-02-20 DIAGNOSIS — I10 Essential (primary) hypertension: Secondary | ICD-10-CM

## 2023-02-20 NOTE — Patient Instructions (Signed)
Medication Instructions:   Your physician recommends that you continue on your current medications as directed. Please refer to the Current Medication list given to you today.   *If you need a refill on your cardiac medications before your next appointment, please call your pharmacy*   Lab Work:  TODAY!!!! LIPID/DIRECT LDL  If you have labs (blood work) drawn today and your tests are completely normal, you will receive your results only by: MyChart Message (if you have MyChart) OR A paper copy in the mail If you have any lab test that is abnormal or we need to change your treatment, we will call you to review the results.   Testing/Procedures:  None ordered.   Follow-Up: At [facility], you and your health needs are our priority.  As part of our continuing mission to provide you with exceptional heart care, we have created designated Provider Care Teams.  These Care Teams include your primary Cardiologist (physician) and Advanced Practice Providers (APPs -  Physician Assistants and Nurse Practitioners) who all work together to provide you with the care you need, when you need it.  We recommend signing up for the patient portal called "MyChart".  Sign up information is provided on this After Visit Summary.  MyChart is used to connect with patients for Virtual Visits (Telemedicine).  Patients are able to view lab/test results, encounter notes, upcoming appointments, etc.  Non-urgent messages can be sent to your provider as well.   To learn more about what you can do with MyChart, go to [email or web address].    Your next appointment:   1 year(s)  Provider:   Verne Carrow, MD     Other Instructions  Your physician wants you to follow-up in: 1 year.  You will receive a reminder letter in the mail two months in advance. If you don't receive a letter, please call our office to schedule the follow-up appointment.    1st Floor: - Lobby - Registration  -  Pharmacy  - Lab - Cafe  2nd Floor: - PV Lab - Diagnostic Testing (echo, CT, nuclear med)  3rd Floor: - Vacant  4th Floor: - TCTS (cardiothoracic surgery) - AFib Clinic - Structural Heart Clinic - Vascular Surgery  - Vascular Ultrasound  5th Floor: - HeartCare Cardiology (general and EP) - Clinical Pharmacy for coumadin, hypertension, lipid, weight-loss medications, and med management appointments    Valet parking services will be available as well.

## 2023-02-21 LAB — LIPID PANEL
Chol/HDL Ratio: 3.7 {ratio} (ref 0.0–4.4)
Cholesterol, Total: 167 mg/dL (ref 100–199)
HDL: 45 mg/dL (ref >39)
LDL Chol Calc (NIH): 99 mg/dL (ref 0–99)
Triglycerides: 132 mg/dL (ref 0–149)
VLDL Cholesterol Cal: 23 mg/dL (ref 5–40)

## 2023-02-21 LAB — LDL CHOLESTEROL, DIRECT: LDL Direct: 100 mg/dL — ABNORMAL HIGH (ref 0–99)

## 2023-02-22 ENCOUNTER — Telehealth: Payer: Self-pay | Admitting: Cardiovascular Disease

## 2023-02-22 DIAGNOSIS — E785 Hyperlipidemia, unspecified: Secondary | ICD-10-CM

## 2023-02-22 DIAGNOSIS — I700 Atherosclerosis of aorta: Secondary | ICD-10-CM

## 2023-02-22 MED ORDER — ROSUVASTATIN CALCIUM 10 MG PO TABS: 10.0000 mg | ORAL_TABLET | Freq: Every day | ORAL | 3 refills | Status: DC

## 2023-02-22 NOTE — Telephone Encounter (Signed)
Levi Aland, NP    02/21/23  4:58 PM Ms. Gillian Scarce, your LDL cholesterol is 100. The goal for you is 70 or lower. I would recommend that you start a medium dose of rosuvastatin 10 mg daily. We can recheck your cholesterol and the liver enzyme that is most often affected by statins in 2-3 months to ensure that your cholesterol is improving. Please let me know if you have any questions.

## 2023-02-22 NOTE — Telephone Encounter (Signed)
We discussed decreasing BP medication to see if her lightheadedness improved. I am agreeable to having her take 1/2 tablet of metoprolol 25 mg for 12.5 mg twice daily. If she does not feel an improvement with the decreased dose, she can resume 25 mg twice daily.

## 2023-02-22 NOTE — Telephone Encounter (Signed)
Calling in about the results. Please advise

## 2023-02-22 NOTE — Telephone Encounter (Signed)
The patient has been notified of the result and verbalized understanding.  All questions (if any) were answered. Frutoso Schatz, RN 02/22/2023 11:57 AM   Crestor has been sent in. Labs have been ordered.   Patient also has concerns about her blood pressure medications and is not sure if she should be taking both of them. She is taking amlodipine 5 mg daily and Lopressor 25 mg daily. I advised that Marcelino Duster had recommended that she cut back on the amlodipine to 2.5 mg daily. She states that she still does not wish to do that. She would prefer to cut back on her Lopressor if Marcelino Duster thought it was okay.

## 2023-02-23 ENCOUNTER — Other Ambulatory Visit: Payer: Self-pay | Admitting: *Deleted

## 2023-02-23 MED ORDER — METOPROLOL TARTRATE 25 MG PO TABS: 25.0000 mg | ORAL_TABLET | Freq: Two times a day (BID) | ORAL | Status: DC

## 2023-02-23 NOTE — Telephone Encounter (Signed)
S/w pt is agreeable to treatment plan. Will take one half (0.5) tablet by mouth ( 12.5 mg) twice daily. Medication list updated.

## 2023-03-04 ENCOUNTER — Other Ambulatory Visit: Payer: Self-pay | Admitting: Cardiovascular Disease

## 2023-03-04 ENCOUNTER — Other Ambulatory Visit: Payer: Self-pay | Admitting: Nurse Practitioner

## 2023-03-04 DIAGNOSIS — I10 Essential (primary) hypertension: Secondary | ICD-10-CM

## 2023-03-07 ENCOUNTER — Other Ambulatory Visit: Payer: Self-pay | Admitting: Cardiovascular Disease

## 2023-03-10 ENCOUNTER — Ambulatory Visit: Payer: Medicare Other | Admitting: Nurse Practitioner

## 2023-03-10 ENCOUNTER — Encounter: Payer: Self-pay | Admitting: Nurse Practitioner

## 2023-03-10 VITALS — BP 116/60 | HR 75 | Ht 65.0 in | Wt 180.1 lb

## 2023-03-10 DIAGNOSIS — K219 Gastro-esophageal reflux disease without esophagitis: Secondary | ICD-10-CM | POA: Diagnosis not present

## 2023-03-10 DIAGNOSIS — Z8711 Personal history of peptic ulcer disease: Secondary | ICD-10-CM

## 2023-03-10 NOTE — Patient Instructions (Addendum)
Continue Pantoprazole 40 mg twice daily and Famotidine 20 mg at bedtime  Follow up in 1 year or as needed.  Due to recent changes in healthcare laws, you may see the results of your imaging and laboratory studies on MyChart before your provider has had a chance to review them.  We understand that in some cases there may be results that are confusing or concerning to you. Not all laboratory results come back in the same time frame and the provider may be waiting for multiple results in order to interpret others.  Please give [place] 48 hours in order for your provider to thoroughly review all the results before contacting the office for clarification of your results.    Thank you for trusting me with your gastrointestinal care!   Alcide Evener, CRNP

## 2023-03-10 NOTE — Progress Notes (Signed)
03/10/2023 Selena [name] [id number] [birth year]   Chief Complaint: Medication refill   History of Present Illness: Selena [name] is a 88 y.o. female with a past medical history of hypertension, CHF, atrial fibrillation on Eliquis, thoracic aneurysm, breast cancer stage I s/p right mastectomy 2013, follicular non-Hodgkin's lymphoma, gastric ulcers and upper GI bleed. She is known by Dr. Meridee Score.  I last saw Selena [name] in office 08/18/2021 for further evaluation regarding bright red blood per the rectum x 1 episode which occurred on 07/20/2021 without recurrence.  A diagnostic colonoscopy was deferred at that time but was to be reconsidered if she had any further rectal bleeding.  She has not been seen in office since then.  She presents today for GERD follow-up and to refill her Pantoprazole and Famotidine prescriptions.  She denies having any further rectal bleeding since 06/2021.  She remains on Pantoprazole 40 mg twice daily and Famotidine 20 mg nightly secondary to history of GERD, gastric ulcers and GI bleed in 2021.  She is accompanied by her daughter.  She denies having any nausea or vomiting.  No heartburn or dysphagia.  No upper or lower abdominal pain.  She is passing normal formed brown bowel movement daily.  No rectal bleeding or black stools.  She remains on Eliquis due to having atrial fibrillation.  No NSAID use.  She walks 1 mile daily.  Appetite is good.  She stated feeling great.  She questioned if she needed to continue Pantoprazole twice daily.      Latest Ref Rng & Units 01/16/2023   11:59 AM 08/10/2022    6:42 PM 06/23/2022    2:44 PM  CBC  WBC 3.4 - 10.8 x10E3/uL 14.6  9.9  7.9   Hemoglobin 11.1 - 15.9 g/dL 40.9  81.1  91.4   Hematocrit 34.0 - 46.6 % 43.6  47.3  42.4   Platelets 150 - 450 x10E3/uL 268  177  169        Latest Ref Rng & Units 01/16/2023   11:59 AM 08/10/2022    6:42 PM 06/23/2022    2:44 PM  CMP  Glucose 70 - 99 mg/dL 90  93  88   BUN 8 -  27 mg/dL 11  16  15    Creatinine 0.57 - 1.00 mg/dL 7.82  9.56  2.13   Sodium 134 - 144 mmol/L 142  144  145   Potassium 3.5 - 5.2 mmol/L 4.2  4.3  4.4   Chloride 96 - 106 mmol/L 101  108  104   CO2 20 - 29 mmol/L 24  26  23    Calcium 8.7 - 10.3 mg/dL 9.1  9.2  9.5      PAST GI PROCEDURES:  EGD 08/20/2019: - No gross lesions in esophagus. - Z-line irregular. - 3 cm hiatal hernia. - Scar in the gastric fundus - Erythematous mucosa in the gastric body and antrum. Biopsied. - No gross lesions in the duodenal bulb, in the first portion of the duodenum and in the second portion of the duodenum.   EGD 05/07/2019:  Extrinsic impression with mild mucosal abnormality noted in the mid esophagus. Biopsied. - No other gross lesions in esophagus. - Widely patent and non-obstructing Schatzki ring. - 3 cm hiatal hernia. - Non-bleeding gastric ulcer with a small flat spot (Forrest Class IIc) was noted in the fundal region coming off of the region of the diaphragmatic hiatus region. This could be an ulcer on a subepithelial lesion  such as a Leiomyoma or GIST, but did not want to tunnel biopsy today based on her clinical picture and status. - Many non-bleeding gastric ulcers and erosions with a clean ulcer base (Forrest Class III) noted. Erythematous mucosa in the stomach. Biopsied for HP. - No gross lesions in the duodenal bulb, in the first portion of the duodenum and in the second portion of the duodenum   Colonoscopy 06/16/2010: showed possible segmental colitis in the sigmoid and descending colon segments with severe diverticulosis in the sigmoid to descending colon otherwise was normal.    Current Outpatient Medications on File Prior to Visit  Medication Sig Dispense Refill   albuterol (VENTOLIN HFA) 108 (90 Base) MCG/ACT inhaler INHALE 2 PUFFS INTO THE LUNGS EVERY 4 HOURS AS NEEDED FOR WHEEZING OR SHORTNESS OF BREATH 18 g 0   amLODipine (NORVASC) 5 MG tablet TAKE 1 TABLET(5 MG) BY MOUTH DAILY 90  tablet 3   apixaban (ELIQUIS) 5 MG TABS tablet TAKE 1 TABLET(5 MG) BY MOUTH TWICE DAILY 180 tablet 1   azelastine (ASTELIN) 0.1 % nasal spray PALCE 2 SPRAYS INTO BOTH NOSTRILS ONCE DAILY FOR 1 DOSE. 30 mL 3   famotidine (PEPCID) 20 MG tablet TAKE 1 TABLET(20 MG) BY MOUTH AT BEDTIME 90 tablet 1   montelukast (SINGULAIR) 10 MG tablet TAKE 1 TABLET(10 MG) BY MOUTH AT BEDTIME 90 tablet 3   pantoprazole (PROTONIX) 40 MG tablet TAKE 1 TABLET(40 MG) BY MOUTH TWICE DAILY 180 tablet 0   rosuvastatin (CRESTOR) 10 MG tablet Take 1 tablet (10 mg total) by mouth daily. 90 tablet 3   cefdinir (OMNICEF) 300 MG capsule Take 1 capsule (300 mg total) by mouth 2 (two) times daily. (Patient not taking: Reported on 03/10/2023) 10 capsule 0   doxycycline (VIBRA-TABS) 100 MG tablet Take 1 tablet (100 mg total) by mouth 2 (two) times daily. (Patient not taking: Reported on 03/10/2023) 10 tablet 0   metoprolol tartrate (LOPRESSOR) 25 MG tablet TAKE 1 TABLET(25 MG) BY MOUTH TWICE DAILY (Patient not taking: Reported on 03/10/2023) 180 tablet 3   No current facility-administered medications on file prior to visit.   Allergies  Allergen Reactions   Codeine Other (See Comments)    Hallucinations.  Pt states she can take this now that she can no longer see due to Macular degeneration  Other Reaction(s): Other  Hallucinations.  Pt states she can take this now that she can no longer see due to Macular degeneration     Hallucinations   Codeine Phosphate Other (See Comments)    Hallucinations   Adhesive [Tape] Other (See Comments)    Electrodes for EKG- skin blistering, erythema   Current Medications, Allergies, Past Medical History, Past Surgical History, Family History and Social History were reviewed in [employer] record.  Review of Systems:   Constitutional: Negative for fever, sweats, chills or weight loss.  Respiratory: Negative for shortness of breath.   Cardiovascular: Negative for chest  pain, palpitations and leg swelling.  Gastrointestinal: See HPI.  Musculoskeletal: Negative for back pain or muscle aches.  Neurological: Negative for dizziness, headaches or paresthesias.   Physical Exam: BP 116/60   Pulse 75   Ht 5\' 5"  (1.651 m)   Wt 180 lb 2 oz (81.7 kg)   BMI 29.97 kg/m  General: 88 year old female in no acute distress. Head: Normocephalic and atraumatic. Eyes: No scleral icterus. Conjunctiva pink . Ears: Normal auditory acuity. Mouth: Dentition intact. No ulcers or lesions.  Lungs: Clear throughout to  auscultation. Heart: Regular rate and rhythm, no murmur. Abdomen: Soft, nontender and nondistended. No masses or hepatomegaly. Normal bowel sounds x 4 quadrants.  Central midline lower abdominal scar intact. Rectal: Deferred.  Musculoskeletal: Symmetrical with no gross deformities. Extremities: Trace lower extremity edema. Neurological: Alert oriented x 4. No focal deficits.  Psychological: Alert and cooperative. Normal mood and affect  Assessment and Recommendations:  88 year old female with a history of GERD, UGI bleed likely due to a gastric ulcers as identified per EGD 05/06/2020 in setting of Eliquis and Meloxicam use. Repeat EGD 08/19/2020 showed a scar in the gastric fundus without further evidence of any active ulcers or erosions. She remains on Pantoprazole 40mg  po bid and Famotidine 20 mg nightly.  No GERD symptoms or abdominal pain.  No overt signs of GI bleeding.  Hemoglobin 14.4 on 01/16/2023. -Continue Pantoprazole 40mg  po bid and Famotidine 20mg  Q HS -GERD diet as tolerated -Avoid NSAIDs  -Patient to contact our office if she develops any GERD symptoms, black or bloody stools -Follow-up in 1 year and as needed   Colon cancer screening. Colonoscopy in 2012, no polyps. Mother and maternal aunt with history of colon cancer -No further colonoscopies recommended due to age  Atrial fibrillation on Eliquis   Today's encounter was 25 minutes which  included precharting, chart/result review, history/exam, face-to-face time used for counseling, formulating a treatment plan with follow-up and documentation.

## 2023-03-14 NOTE — Progress Notes (Signed)
 Attending Physician's Attestation   I have reviewed the chart.   I agree with the Advanced Practitioner's note, impression, and recommendations with any updates as below. As patient has been doing well, I think it is reasonable to start trying to downtitrate her PPI and maintain on some form of PPI due to history of previous bleeding ulcer years ago.  If she wants to cut down to PPI 40 mg daily for a month and then if no issues, consider going down to 20 mg daily thereafter, that could be helpful.  May continue H2RA as well if desired.   Corliss Parish, MD [facility] Office # [phone]

## 2023-03-20 ENCOUNTER — Telehealth: Payer: Self-pay

## 2023-03-20 ENCOUNTER — Telehealth: Payer: Self-pay | Admitting: Nurse Practitioner

## 2023-03-20 DIAGNOSIS — R42 Dizziness and giddiness: Secondary | ICD-10-CM

## 2023-03-20 NOTE — Telephone Encounter (Signed)
 Pt c/o medication issue:  1. Name of Medication:   metoprolol tartrate (LOPRESSOR) 25 MG tablet    amLODipine (NORVASC) 5 MG tablet    2. How are you currently taking this medication (dosage and times per day)?   3. Are you having a reaction (difficulty breathing--STAT)? No  4. What is your medication issue? Pt states that she thinks that above medications are causing her to become dizzy and lightheaded

## 2023-03-20 NOTE — Progress Notes (Signed)
 Selena [name], pls contact patient and let her know Dr. Elesa Hacker recommendations in his addendum below, she can decrease Pantoprazole 40mg  to once daily to be taken 30 minutes before breakfast. Stop the PM dose of Pantoprazole. Continue Famotidine 20mg  one tab po Q HS. If she has breakthrough reflux symptoms after she reduces PPI to every day, she can take an extra dose of Famotidine as needed. THX.

## 2023-03-20 NOTE — Telephone Encounter (Signed)
 Pt made aware of Dr. Meridee Score and Alcide Evener NP recommendations: Pt verbalized understanding with all questions answered.

## 2023-03-20 NOTE — Telephone Encounter (Signed)
 Message Received: Today Arnaldo Natal, NP  Selena Darling, RN      Previous Messages  Routed Note  Author: Arnaldo Natal, NP Service: Gastroenterology Author Type: Nurse Practitioner  Filed: 03/20/2023  6:24 AM Encounter Date: 03/10/2023 Status: Signed  Editor: Arnaldo Natal, NP (Nurse Practitioner)   [name], pls contact patient and let her know Dr. Elesa Hacker recommendations in his addendum below, she can decrease Pantoprazole 40mg  to once daily to be taken 30 minutes before breakfast. Stop the PM dose of Pantoprazole. Continue Famotidine 20mg  one tab po Q HS. If she has breakthrough reflux symptoms after she reduces PPI to every day, she can take an extra dose of Famotidine as needed. THX.

## 2023-03-20 NOTE — Telephone Encounter (Signed)
 Patient reports that she has continued to have dizziness and lightheadedness. She did cut back on her metoprolol to 12.5 mg twice daily. She states that it did not help with her symptoms. She gets dizzy when she gets up and walks around. She does take her time with position changes. She reports that she stays hydrated with water and gatorade. She reports recent blood pressures of 112/54, 126/66, and 132/80. She has a history of vertigo and has talked with her PCP who does not think that her dizziness is related.

## 2023-03-21 NOTE — Telephone Encounter (Signed)
 Is she taking metoprolol now? If so, she can wean off it and then discontinue it for now. Please order a carotid duplex to evaluate for potential stenosis that may be contributing (diagnosis: dizziness). She should take note to whether dizziness improves when she stops metoprolol. We may have to consider diltiazem for rate control if HR is elevated once she stops metoprolol.

## 2023-03-21 NOTE — Telephone Encounter (Signed)
 Called patient back about Eligha Bridegroom NP recommendations. Patient will wean off her metoprolol and call our office if this does not help. Will place order for carotid.

## 2023-03-28 ENCOUNTER — Ambulatory Visit (HOSPITAL_COMMUNITY)
Admission: RE | Admit: 2023-03-28 | Discharge: 2023-03-28 | Disposition: A | Discharge status: Home / Self Care | Payer: Medicare Other | Source: Ambulatory Visit | Attending: Nurse Practitioner | Admitting: Nurse Practitioner

## 2023-03-28 DIAGNOSIS — R42 Dizziness and giddiness: Secondary | ICD-10-CM | POA: Diagnosis present

## 2023-04-16 ENCOUNTER — Other Ambulatory Visit: Payer: Self-pay | Admitting: Nurse Practitioner

## 2023-04-18 ENCOUNTER — Telehealth: Payer: Self-pay | Admitting: Nurse Practitioner

## 2023-04-18 MED ORDER — PANTOPRAZOLE SODIUM 40 MG PO TBEC: 40.0000 mg | DELAYED_RELEASE_TABLET | Freq: Two times a day (BID) | ORAL | 3 refills | Status: AC

## 2023-04-18 NOTE — Telephone Encounter (Signed)
 Rx was refilled and patient was notified.

## 2023-04-18 NOTE — Telephone Encounter (Signed)
Patient called to request a refill on Pantoprazole. 

## 2023-04-27 ENCOUNTER — Encounter: Payer: Self-pay | Admitting: Family Medicine

## 2023-04-27 ENCOUNTER — Ambulatory Visit: Payer: Medicare Other | Admitting: Family Medicine

## 2023-04-27 VITALS — BP 130/70 | HR 92 | Ht 65.0 in | Wt 181.2 lb

## 2023-04-27 DIAGNOSIS — E041 Nontoxic single thyroid nodule: Secondary | ICD-10-CM

## 2023-04-27 DIAGNOSIS — Z9889 Other specified postprocedural states: Secondary | ICD-10-CM

## 2023-04-27 DIAGNOSIS — E049 Nontoxic goiter, unspecified: Secondary | ICD-10-CM | POA: Insufficient documentation

## 2023-04-27 DIAGNOSIS — I4821 Permanent atrial fibrillation: Secondary | ICD-10-CM

## 2023-04-27 DIAGNOSIS — R42 Dizziness and giddiness: Secondary | ICD-10-CM

## 2023-04-27 DIAGNOSIS — I10 Essential (primary) hypertension: Secondary | ICD-10-CM

## 2023-04-27 DIAGNOSIS — G4721 Circadian rhythm sleep disorder, delayed sleep phase type: Secondary | ICD-10-CM

## 2023-04-27 DIAGNOSIS — G4700 Insomnia, unspecified: Secondary | ICD-10-CM

## 2023-04-27 DIAGNOSIS — E7800 Pure hypercholesterolemia, unspecified: Secondary | ICD-10-CM

## 2023-04-27 DIAGNOSIS — J849 Interstitial pulmonary disease, unspecified: Secondary | ICD-10-CM

## 2023-04-27 DIAGNOSIS — J310 Chronic rhinitis: Secondary | ICD-10-CM

## 2023-04-27 DIAGNOSIS — R0609 Other forms of dyspnea: Secondary | ICD-10-CM

## 2023-04-27 MED ORDER — IPRATROPIUM BROMIDE 0.06 % NA SOLN
2.0000 | Freq: Four times a day (QID) | NASAL | 12 refills | Status: AC
Start: 2023-04-27 — End: ?

## 2023-04-27 MED ORDER — DOXEPIN HCL 10 MG/ML PO CONC: 10.0000 mg | Freq: Every evening | ORAL | 12 refills | Status: DC | PRN

## 2023-04-27 NOTE — Progress Notes (Unsigned)
 Selena [name] is {Pc accompanied by:5710} Sources of clinical information for visit is/are {Information source:60032}. Nursing assessment for this office visit was reviewed with the patient for accuracy and revision.     Previous Report(s) Reviewed: {Outside review:15817}     04/27/2023   11:07 AM  Depression screen PHQ 2/9  Decreased Interest 0  Down, Depressed, Hopeless 0  PHQ - 2 Score 0  Altered sleeping 1  Tired, decreased energy 3  Change in appetite 0  Feeling bad or failure about yourself  2  Trouble concentrating 0  Moving slowly or fidgety/restless 0  Suicidal thoughts 0  PHQ-9 Score 6   Flowsheet Row Office Visit from 04/27/2023 in [facility] - A Dept Of [place]. [facility] Office Visit from 01/16/2023 in [facility] - A Dept Of [place] Bridegroom. [facility] Office Visit from 06/23/2022 in [facility] - A Dept Of [place]. [facility]  Thoughts that you would be better off dead, or of hurting yourself in some way Not at all Not at all Not at all  PHQ-9 Total Score 6 15 5           04/27/2023   11:07 AM 01/16/2023   11:21 AM 09/12/2022    4:34 PM 06/23/2022   10:57 AM 03/22/2022   10:29 AM  Fall Risk   Falls in the past year?  0 0 0 0  Number falls in past yr: 0 0 0 0 0  Injury with Fall? 1 0 0 0 0  Risk for fall due to :  No Fall Risks Impaired balance/gait    Follow up  Falls evaluation completed Falls prevention discussed;Education provided;Falls evaluation completed         04/27/2023   11:07 AM 01/16/2023   11:21 AM 09/12/2022    1:28 PM  PHQ9 SCORE ONLY  PHQ-9 Total Score 6 15 0    There are no preventive care reminders to display for this patient.  Health Maintenance Due  Topic Date Due   Zoster Vaccines- Shingrix (1 of 2) 10/14/1954      History/P.E. limitations: {exam; limitations ed:60112}  There are no preventive care reminders to display for this patient. There are no  preventive care reminders to display for this patient.  Health Maintenance Due  Topic Date Due   Zoster Vaccines- Shingrix (1 of 2) 10/14/1954     Chief Complaint  Patient presents with   Medical Management of Chronic Issues     --------------------------------------------------------------------------------------------------------------------------------------------- Visit Problem List with A/P  No problem-specific Assessment & Plan notes found for this encounter.

## 2023-04-27 NOTE — Patient Instructions (Addendum)
 It seems that your dizziness is related to being upright and with walking.  Please come in to have the 24 hours blood pressure monitor set up with Dr Raymondo Band.    We are checking you for anemia, cholesterol, thyroid.   Try the medication, doxepin, 10 mg tablet, one tablet about an hour before you are going to bed.    Start taking ipratropium nasal spray, two sprays each nostril, four times a day to stop runny nose.   If you still have problems with runny nose while suing the azelastine and ipratropium, then start chlorpheniramine 4 mg tablet up to three times a day.  This may make you sleepy.  It can cause confusion in some older adults, but you have tolerated it in the past.

## 2023-04-28 ENCOUNTER — Other Ambulatory Visit

## 2023-04-28 ENCOUNTER — Encounter: Payer: Self-pay | Admitting: Family Medicine

## 2023-04-28 DIAGNOSIS — E049 Nontoxic goiter, unspecified: Secondary | ICD-10-CM

## 2023-04-28 DIAGNOSIS — R42 Dizziness and giddiness: Secondary | ICD-10-CM

## 2023-04-28 DIAGNOSIS — G4721 Circadian rhythm sleep disorder, delayed sleep phase type: Secondary | ICD-10-CM | POA: Insufficient documentation

## 2023-04-28 DIAGNOSIS — E7800 Pure hypercholesterolemia, unspecified: Secondary | ICD-10-CM | POA: Insufficient documentation

## 2023-04-28 DIAGNOSIS — E041 Nontoxic single thyroid nodule: Secondary | ICD-10-CM

## 2023-04-28 NOTE — Addendum Note (Signed)
 Addended byPerley Jain, Lelaina Oatis D on: 04/28/2023 11:53 AM   Modules accepted: Orders

## 2023-04-28 NOTE — Assessment & Plan Note (Addendum)
 Need to discuss monitoring ultrasound of heterogenous and enlarged left thyroid lobe with ultrasound.   08/30/21 CT Chest:  Multinodular goiter with nodules measuring up to approximately 4.1 cm on the left.  "Thyroid US done on 10/03/2018:Multinodular thyroid.  Right upper thyroid nodules (labeled 3 and labeled 4) both meet criteria for surveillance, as designated by the newly established ACR TI-RADS criteria. Surveillance ultrasound study recommended to be performed annually up to 5 years."

## 2023-04-28 NOTE — Assessment & Plan Note (Signed)
 Established problem Increased dyspnea on exertion with decreased endurance Recommend follow up with Dr Isaiah Serge [facility]) Discuss referral for Pulmonary Rehab next ov

## 2023-04-28 NOTE — Assessment & Plan Note (Signed)
 Established problem worsened.  Selena [name]'s shortness of breath with walking to mail box and about home has had a decline over an uncertain time period. It may be a contributor to her feeling of dizziness with walking.  According to Dr Isaiah Serge (Pulm) note 09/14/21, Selena Sprenkle was to follow up in 6 months.   (+) Albuterol meter dosed inhaler taking twice a day, regularly.   Recommend Exertional SaO2 with pulse(?tachycardia > 110?) next office visit.  Discuss follow up app't with Pulm at next office visit

## 2023-04-28 NOTE — Assessment & Plan Note (Signed)
 Intermittent difficulty falling asleep - sometimes falls asleep 4 am and sleeps to 10-11 am.  Watching TV in recliner Reports trazodone 50 mg at bedtime prn did not help.   Will encourage earlier awakening when falling asleep late.   Trail Doxepin 10 mg at bedtime prn

## 2023-04-28 NOTE — Assessment & Plan Note (Addendum)
 Dizziness Feeling dizzy for: several weeks Precipitants of dizziness: mosting with walking, sometimes standing Feels like room spins: no - not like her episodes of BPPV she has had when turning on side in bed Nausea or vomiting with dizziness: no Lightheadedness when stands: yes Palpitations or heart racing: no Stopping metoprolol did not improve dizziness  Holter monitor 2020 with occasional PAC, PVC at pSVT with longest of 66 beats.    EKG 08/10/22 Atrial fib with low voltage  Last transthoracic echocardiogram 1. Left ventricular ejection fraction, by estimation, is 55 to 60%. The  left ventricle has normal function. The left ventricle has no regional  wall motion abnormalities. Left ventricular diastolic function could not  be evaluated. Right ventricular systolic function is normal. The right ventricular  size is normal.  Left atrial size was severely dilated. Right atrial size was mildly dilated.  The mitral valve is normal in structure. Trivial mitral valve regurgitation. No evidence of mitral stenosis.   The aortic valve is tricuspid. Aortic valve regurgitation is trivial.  Aortic valve sclerosis is present, with no evidence of aortic valve stenosis.   Relevant medications:  Amlodipine   Relevant PMH: Vertigo and HTN, Prior history of dizziness: Yes - placement of ILD for Aflutter No falls Vitals:   04/27/23 1108 04/27/23 1130  BP: 125/81 Sitting 130/70 standing  Pulse: 92   SpO2: 99%    Wt Readings from Last 3 Encounters:  04/27/23 181 lb 4 oz (82.2 kg)  03/10/23 180 lb 2 oz (81.7 kg)  02/20/23 179 lb (81.2 kg)   General: alert, cooperative Heart: irr irr HR 88 no murmur Extremities: no edema Neuro:  Slow walking with cane Wobbles with feet side-by-side (esp with eyes closed) and in semitandem, but able to maintain balance   Lab Results  Component Value Date   WBC 14.6 (H) 01/16/2023   HGB 14.4 01/16/2023   HCT 43.6 01/16/2023   MCV 94 01/16/2023   PLT 268  01/16/2023    Assessment   Dizziness  Differential diagnosis Cardiac - LV dysfunction Vs  valve Vs dysrhythmia Orthostatic hypotension Autonomic insufficiency  Anemia Anxiety IHD   Plan - Ambulatory BP monitoring to look for hypotension with ambulation - CBC and Ferritin - transthoracic echocardiogram  - Ambulatory SaO2 next office visit with pulse with exertion - Ziopatch if able to apply without skin adhesive reaction. ?Cavilon

## 2023-04-28 NOTE — Assessment & Plan Note (Signed)
 Established problem Well Controlled. Patient is at goal of < 140 SBP. No evidence of hypotension after standing for a couple minutes BP controlled despite stoppage of metoprolol No signs of complications, medication side effects, or red flags. Continue Amlodipine 5 mg daily

## 2023-04-28 NOTE — Assessment & Plan Note (Signed)
 Established problem Stable No evidence of systemic embolism No report of bleeding on apixiban which Ms Edgecombe is taking Her metoprolol has been stopped by cardiology for the patient's complaint of dizziness. Ms Flaugher has in implanted loop monitor which was not used soon after it was implanted because Ms Garron did not have confidence that it was helpful in detecting arrhythmias when she experienced symptoms.  Cor: irr irr rate 92 Vitals:   04/27/23 1108 04/27/23 1130  BP: 125/81 sitting 130/80 standing  Pulse: 92   SpO2: 99%    Assessment and Plan Rate remains controlled off of metoprolol entirely.  She continues to experience dizziness with walking.  We did not access her pulse with walking.  This should be done if dizziness persists.   No overt bleeding, but given dizziness with walking, will check Hgb Recommend continuing apixaban.  Ms Rosamond has a significant local allergic reaction to skin adhesives, like electrodes, which would make use of a Ziopatch problematic.  62M Cavilon No Sting Barrier Film may be an option to protect skin.

## 2023-04-28 NOTE — Assessment & Plan Note (Signed)
 Established problem worsened.  Patient is not at goal of rhinorrhea control Similar to episodes in past of persistent, clear,, bilateral rhinorrhea. Responded in past (08/19/21) to combination of Flonase/azelastine and nasal ipratropium 0.06%. If still with inadequate response then may use chlorpheniramine 4 mg three times a day prn rhinorrhea. Selena [name] has tolerated chlorpheniramine in past at this dose/schjediule Again warned that should she take chlorpheniramine, it should be with someone around who may watch for sedation or confusion.  Selena. [name] said the family is working out a schedule so someone is with Mrs Adderley each night.  RTC 4 weeks to assess response.

## 2023-04-29 LAB — TSH: TSH: 0.661 u[IU]/mL (ref 0.450–4.500)

## 2023-04-29 LAB — LDL CHOLESTEROL, DIRECT: LDL Direct: 46 mg/dL (ref 0–99)

## 2023-04-29 LAB — CBC
Hematocrit: 45 % (ref 34.0–46.6)
Hemoglobin: 14.4 g/dL (ref 11.1–15.9)
MCH: 30.8 pg (ref 26.6–33.0)
MCHC: 32 g/dL (ref 31.5–35.7)
MCV: 96 fL (ref 79–97)
Platelets: 186 10*3/uL (ref 150–450)
RBC: 4.67 x10E6/uL (ref 3.77–5.28)
RDW: 13.2 % (ref 11.7–15.4)
WBC: 10.7 10*3/uL (ref 3.4–10.8)

## 2023-04-29 LAB — FERRITIN: Ferritin: 82 ng/mL (ref 15–150)

## 2023-05-01 ENCOUNTER — Ambulatory Visit: Admitting: Pharmacist

## 2023-05-01 ENCOUNTER — Encounter: Payer: Self-pay | Admitting: Pharmacist

## 2023-05-01 VITALS — BP 151/76 | HR 85 | Wt 181.4 lb

## 2023-05-01 DIAGNOSIS — I10 Essential (primary) hypertension: Secondary | ICD-10-CM | POA: Diagnosis not present

## 2023-05-01 NOTE — Progress Notes (Signed)
 Reviewed and agree with Dr Macky Lower plan.

## 2023-05-01 NOTE — Patient Instructions (Signed)
 Blood Pressure Activity Diary Time Lying down/ Sleeping Walking/ Exercise Stressed/ Angry Headache/ Pain Dizzy  9 AM       10 AM       11 AM       12 PM       1 PM       2 PM       Time Lying down/ Sleeping Walking/ Exercise Stressed/ Angry Headache/ Pain Dizzy  3 PM       4 PM        5 PM       6 PM       7 PM       8 PM       Time Lying down/ Sleeping Walking/ Exercise Stressed/ Angry Headache/ Pain Dizzy  9 PM       10 PM       11 PM       12 AM       1 AM       2 AM       3 AM       Time Lying down/ Sleeping Walking/ Exercise Stressed/ Angry Headache/ Pain Dizzy  4 AM       5 AM       6 AM       7 AM       8 AM       9 AM       10 AM        Time you woke up: _________                  Time you went to sleep:__________  Come back tomorrow at 8:30 a.m. to have the monitor removed  Call the [facility] if you have any questions before then ([phone])  Wearing the Blood Pressure Monitor The cuff will inflate every 20 minutes during the day and every 30 minutes while you sleep. Fill out the blood pressure-activity diary during the day, especially during activities that may affect your reading -- such as exercise, stress, walking, taking your blood pressure medications  Important things to know: Avoid taking the monitor off for the next 24 hours, unless it causes you discomfort or pain. Do NOT get the monitor wet and do NOT try to clean the monitor with any cleaning products. Do NOT put the monitor on anyone else's arm. When the cuff inflates, avoid excess movement. Let the cuffed arm hang loosely, slightly away from the body. Avoid flexing the muscles or moving the hand/fingers. Remember to fill out the blood pressure activity diary. If you experience severe pain or unusual pain (not associated with getting your blood pressure checked), remove the monitor.  Troubleshooting:  Code  Troubleshooting   1  Check cuff position, tighten cuff   2, 3  Remain  still during reading   4, 87  Check air hose connections and make sure cuff is tight   85, 89  Check hose connections and make tubing is not crimped   86  Push START/STOP to restart reading   88, 91  Retry by pushing START/STOP   90  Replace batteries. If problem persists, remove monitor and bring back to   clinic at follow up   97, 98, 99  Service required - Remove monitor and bring back to clinic at follow up

## 2023-05-01 NOTE — Progress Notes (Signed)
 S:     Chief Complaint  Patient presents with   Medication Management    Amb BP Monitoring Day #1   88 y.o. female who presents for hypertension evaluation, education, and management. Patient arrives in good spirits and presents with a cane. Patient is accompanied by her son, Deniece Portela.   PMH is significant for AFib, HTN, dizziness.  Patient was referred and last seen by Primary Care Provider, Dr. McDiarmid, on 04/27/23.   At last visit, patient reported dizziness. Today, patient reports dizziness for the last 2 weeks, when in movement (standing up, laying in bed, and while walking) that lasts for 3-4 minutes.  Diagnosed with Hypertension in the year of 2019.    Medication compliance is reported to be good.  Discussed procedure for wearing the monitor and gave patient written instructions. Monitor was placed on non-dominant arm with instructions to return in the morning.   Current BP Medications include:  amlodipine 5 mg  O:  Review of Systems  Eyes:  Positive for blurred vision (chronic - macular degeneration).  Musculoskeletal:  Positive for back pain.  Neurological:  Positive for dizziness.  All other systems reviewed and are negative.   Physical Exam Vitals reviewed.  Constitutional:      Appearance: Normal appearance.  Pulmonary:     Effort: Pulmonary effort is normal.  Neurological:     Mental Status: She is alert.  Psychiatric:        Mood and Affect: Mood normal.        Behavior: Behavior normal.        Thought Content: Thought content normal.        Judgment: Judgment normal.    Last 3 Office BP readings: BP Readings from Last 3 Encounters:  04/27/23 130/70  03/10/23 116/60  02/20/23 (!) 112/54   Basic Metabolic Panel    Component Value Date/Time   NA 142 01/16/2023 1159   NA 143 12/06/2016 1057   K 4.2 01/16/2023 1159   K 4.2 12/06/2016 1057   CL 101 01/16/2023 1159   CL 107 04/05/2012 1253   CO2 24 01/16/2023 1159   CO2 27 12/06/2016 1057    GLUCOSE 90 01/16/2023 1159   GLUCOSE 93 08/10/2022 1842   GLUCOSE 96 12/06/2016 1057   GLUCOSE 88 04/05/2012 1253   BUN 11 01/16/2023 1159   BUN 18.5 12/06/2016 1057   CREATININE 0.73 01/16/2023 1159   CREATININE 0.66 04/26/2022 1427   CREATININE 0.80 10/22/2019 1404   CREATININE 0.8 12/06/2016 1057   CALCIUM 9.1 01/16/2023 1159   CALCIUM 9.5 12/06/2016 1057   GFRNONAA >60 08/10/2022 1842   GFRNONAA >60 04/26/2022 1427   GFRAA >60 05/08/2019 0604   GFRAA >60 12/13/2018 1046   ABPM Study Data: Arm Placement left arm  For Office Goal BP of <140 mmHg Systolic - lower if tolerated:  ABPM thresholds: Overall Systolic BP <130 mmHg, daytime BP <135 mmHg, sleeptime BP <120 mmHg    A/P: History of hypertension uncontrolled longstanding 6 years (2019) currently taking amlodipine; with goal presssure of <140 mmHg systolic. Episodes of dizziness complicate attempts to lower pressure further.  History of A-fib and has taken metoprolol as rate/blood pressure agent in the recent past.  -Placed blood pressure cuff, provided education, patient instructed to wear cuff for 24 hours and return tomorrow to review results.   Written patient instructions provided including activity/symptom/event log. Patient verbalized understanding of plan. Total time in face to face counseling 27 minutes.    Follow-up:  Tomorrow AM - early morning appointment 8:30am  Patient seen with Threasa Heads, PharmD Candidate and Darolyn Rua, PharmD Candidate.

## 2023-05-01 NOTE — Assessment & Plan Note (Signed)
 History of hypertension uncontrolled longstanding 6 years (2019) currently taking amlodipine; with goal presssure of <140 mmHg systolic. Episodes of dizziness complicate attempts to lower pressure further.  History of A-fib and has taken metoprolol as rate/blood pressure agent in the recent past.  -Placed blood pressure cuff, provided education, patient instructed to wear cuff for 24 hours and return tomorrow to review results.

## 2023-05-02 ENCOUNTER — Encounter: Payer: Self-pay | Admitting: Pharmacist

## 2023-05-02 ENCOUNTER — Telehealth: Payer: Self-pay

## 2023-05-02 ENCOUNTER — Ambulatory Visit: Admitting: Pharmacist

## 2023-05-02 VITALS — BP 130/72 | HR 84 | Wt 183.4 lb

## 2023-05-02 DIAGNOSIS — I10 Essential (primary) hypertension: Secondary | ICD-10-CM

## 2023-05-02 MED ORDER — AMLODIPINE BESYLATE 2.5 MG PO TABS: 2.5000 mg | ORAL_TABLET | Freq: Every day | ORAL | 3 refills | Status: AC

## 2023-05-02 NOTE — Assessment & Plan Note (Signed)
 History of hypertension uncontrolled longstanding 6 years currently taking amlodipine 5 mg; with goal presssure of <140 mmHg systolic. Found to have normal blood pressure with 24-hour ambulatory blood pressure evaluation which demonstrates an average AWAKE blood pressure of 130/72 mmHg.  Nocturnal dipping pattern is normal.  Dizziness was experienced throughout the day.  The element of white-coat hypertension and dizziness are concerning.  Discussed with patient and agreed with lowering of amlodipine.   Changes to medications -Decreased dose of amlodipine from 5 mg to 2.5 mg - new prescription provided.  Patient will monitor for lower frequency of dizziness.

## 2023-05-02 NOTE — Telephone Encounter (Signed)
-----   Message from [facility] McDiarmid sent at 05/02/2023  9:17 AM EDT ----- Regarding: Please schedule appointment Please schedule appointment with me for Ms Cahn in the next 2 to 3 weeks.  May double book.

## 2023-05-02 NOTE — Patient Instructions (Addendum)
 It was nice to see you today!  Your goal blood pressure is 140 mmHg systolic.  Call in 1 - 2 weeks to make a follow-up appointment with Dr. Perley Jain in May/June.  Medication Changes: Decrease amlodipine from 5 mg daily to 2.5 mg daily.  Continue all other medication the same.   Monitor blood pressure at home daily and keep a log (on your phone or piece of paper) to bring with you to your next visit. Write down date, time, blood pressure and pulse.  Keep up the good work with diet and exercise. Aim for a diet full of vegetables, fruit and lean meats (chicken, [place], fish). Try to limit salt intake by eating fresh or frozen vegetables (instead of canned), rinse canned vegetables prior to cooking and do not add any additional salt to meals.

## 2023-05-02 NOTE — Progress Notes (Signed)
   S:     Chief Complaint  Patient presents with   Medication Management    Ambulatory BP Monitor - Day #2   88 y.o. female who presents for hypertension evaluation, education, and management.  Patient arrives in  good spirits and presents with a cane. Patient is accompanied by her daughter Aggie Cosier.   PMH is significant for AFib, HTN, dizziness.  Patient reports they were able to wear the Ambulatory Blood Pressure Cuff for the entire 24 evaluation period.   O:    Last 3 Office BP readings: BP Readings from Last 3 Encounters:  05/01/23 (!) 151/76  04/27/23 130/70  03/10/23 116/60    Basic Metabolic Panel    Component Value Date/Time   NA 142 01/16/2023 1159   NA 143 12/06/2016 1057   K 4.2 01/16/2023 1159   K 4.2 12/06/2016 1057   CL 101 01/16/2023 1159   CL 107 04/05/2012 1253   CO2 24 01/16/2023 1159   CO2 27 12/06/2016 1057   GLUCOSE 90 01/16/2023 1159   GLUCOSE 93 08/10/2022 1842   GLUCOSE 96 12/06/2016 1057   GLUCOSE 88 04/05/2012 1253   BUN 11 01/16/2023 1159   BUN 18.5 12/06/2016 1057   CREATININE 0.73 01/16/2023 1159   CREATININE 0.66 04/26/2022 1427   CREATININE 0.80 10/22/2019 1404   CREATININE 0.8 12/06/2016 1057   CALCIUM 9.1 01/16/2023 1159   CALCIUM 9.5 12/06/2016 1057   GFRNONAA >60 08/10/2022 1842   GFRNONAA >60 04/26/2022 1427   GFRAA >60 05/08/2019 0604   GFRAA >60 12/13/2018 1046     ABPM Study Data: Arm Placement left arm  Overall Mean 24hr BP:   126/71 mmHg  HR: 81  Daytime Mean BP:  130/72 mmHg  HR: 84  Nighttime Mean BP:  118/70 mmHg  HR: 77  Dipping Pattern: Yes.    Sys:   9.1%   Dia: 2.5%   [normal dipping ~10-20%]    For Office Goal BP of <140 mmHg Systolic - lower if tolerated:  ABPM thresholds: Overall Systolic BP <130 mmHg, daytime BP <135 mmHg, sleeptime BP <120 mmHg     A/P: History of hypertension uncontrolled longstanding 6 years currently taking amlodipine 5 mg; with goal presssure of <140 mmHg systolic. Found to  have normal blood pressure with 24-hour ambulatory blood pressure evaluation which demonstrates an average AWAKE blood pressure of 130/72 mmHg.  Nocturnal dipping pattern is normal.  Dizziness was experienced throughout the day.  The element of white-coat hypertension and dizziness are concerning.  Discussed with patient and agreed with lowering of amlodipine.   Changes to medications -Decreased dose of amlodipine from 5 mg to 2.5 mg - new prescription provided.  Patient will monitor for lower frequency of dizziness.   Drug cost issue - provided LIS phone # [phone] Patient likely to qualify.  Asked to determine status and then we can proceed with options for Eliquis.  MP3 program may be required to cover medication cost.    Results reviewed and written information provided.    Written patient instructions provided. Patient verbalized understanding of treatment plan.  Total time in face to face counseling 11 minutes.    Follow-up:  PCP clinic visit in May/June with Dr. McDiarmid  Patient seen with Darolyn Rua, PharmD Candidate and Threasa Heads, PharmD Candidate

## 2023-05-02 NOTE — Telephone Encounter (Signed)
 Spoke with patient. Made follow up for May 1st at 11:00. Aquilla Solian, CMA

## 2023-05-10 NOTE — Progress Notes (Signed)
 Reviewed and agree with Dr Macky Lower plan.

## 2023-05-11 ENCOUNTER — Telehealth: Payer: Self-pay | Admitting: Pharmacist

## 2023-05-11 NOTE — Telephone Encounter (Signed)
 Reviewed and agree with Dr Macky Lower plan.

## 2023-05-11 NOTE — Telephone Encounter (Signed)
 Patient contacted for follow-up of need for Eliquis - LIS access  Since last contact patient reports she now has about 1 week supply left of Eliquis (apixaban) 5mg   She reports she has been unable to access support from the [employer] when she calls for assistance in applying for Low-Income-Subsity (LIS) or Medicare Extra Help.   She reports they hang up on her.   Medication Samples have been provided to the patient.  Drug name: Eliquis (apixaban)       Strength: 5mg         Qty: 56  LOT: [id number]  Exp.Date: 02/24/2024  Dosing instructions: 1 BID  The patient has been instructed regarding the correct time, dose, and frequency of taking this medication, including desired effects and most common side effects.   Cristal Don 11:30 AM 05/11/2023  I will reach out to [facility] to assist with LIS access issue.  Total time with patient call and documentation of interaction: 17 minutes.  F/U Phone call planned: 3.5 weeks to determine status for Eliquis supply.

## 2023-05-21 ENCOUNTER — Other Ambulatory Visit: Payer: Self-pay | Admitting: Family Medicine

## 2023-05-21 DIAGNOSIS — J849 Interstitial pulmonary disease, unspecified: Secondary | ICD-10-CM

## 2023-05-25 ENCOUNTER — Encounter: Payer: Self-pay | Admitting: Family Medicine

## 2023-05-25 ENCOUNTER — Ambulatory Visit: Admitting: Family Medicine

## 2023-05-25 VITALS — BP 122/67 | HR 90 | Ht 65.0 in | Wt 178.0 lb

## 2023-05-25 DIAGNOSIS — Z596 Low income: Secondary | ICD-10-CM

## 2023-05-25 DIAGNOSIS — I10 Essential (primary) hypertension: Secondary | ICD-10-CM | POA: Diagnosis not present

## 2023-05-25 DIAGNOSIS — J849 Interstitial pulmonary disease, unspecified: Secondary | ICD-10-CM | POA: Diagnosis not present

## 2023-05-25 DIAGNOSIS — R0609 Other forms of dyspnea: Secondary | ICD-10-CM

## 2023-05-25 DIAGNOSIS — Z5971 Insufficient health insurance coverage: Secondary | ICD-10-CM | POA: Diagnosis not present

## 2023-05-25 DIAGNOSIS — I4821 Permanent atrial fibrillation: Secondary | ICD-10-CM

## 2023-05-25 DIAGNOSIS — R42 Dizziness and giddiness: Secondary | ICD-10-CM | POA: Diagnosis not present

## 2023-05-25 DIAGNOSIS — R053 Chronic cough: Secondary | ICD-10-CM

## 2023-05-25 DIAGNOSIS — E049 Nontoxic goiter, unspecified: Secondary | ICD-10-CM

## 2023-05-25 NOTE — Patient Instructions (Signed)
 iT IS GREAT THAT YOUR RUNNY NOSE AND COUGH ARE BETTER, AND YOUR DIZZINESS IS BETTER WITH THE LOWER Amlodipine  dose. Keep to just Amlodipine  2.5 mg daily.  Continue your noase sprays and Mucinex .    Dr Shadiyah Wernli will see if we can help contacting  Medicare about you getting Lower Income Subsidy insurance to help cover the const of the Apixaban .

## 2023-05-25 NOTE — Progress Notes (Signed)
 Selena [name] is accompanied by sister Sources of clinical information for visit is/are patient. Nursing assessment for this office visit was reviewed with the patient for accuracy and revision.     Previous Report(s) Reviewed: Pulm Consult with Dr Waylan Haggard 08/23 who recommended a 6 month follow up visit     04/27/2023   11:07 AM  Depression screen PHQ 2/9  Decreased Interest 0  Down, Depressed, Hopeless 0  PHQ - 2 Score 0  Altered sleeping 1  Tired, decreased energy 3  Change in appetite 0  Feeling bad or failure about yourself  2  Trouble concentrating 0  Moving slowly or fidgety/restless 0  Suicidal thoughts 0  PHQ-9 Score 6   Flowsheet Row Office Visit from 04/27/2023 in [facility] - A Dept Of [place]. [facility] Office Visit from 01/16/2023 in [facility] - [facility] Dept Of [facility] Fragmin. [facility] Office Visit from 06/23/2022 in [facility] - A Dept Of [place]. [facility]  Thoughts that you would be better off dead, or of hurting yourself in some way Not at all Not at all Not at all  PHQ-9 Total Score 6 15 5           04/27/2023   11:07 AM 01/16/2023   11:21 AM 09/12/2022    4:34 PM 06/23/2022   10:57 AM 03/22/2022   10:29 AM  Fall Risk   Falls in the past year?  0 0 0 0  Number falls in past yr: 0 0 0 0 0  Injury with Fall? 1 0 0 0 0  Risk for fall due to :  No Fall Risks Impaired balance/gait    Follow up  Falls evaluation completed Falls prevention discussed;Education provided;Falls evaluation completed         04/27/2023   11:07 AM 01/16/2023   11:21 AM 09/12/2022    1:28 PM  PHQ9 SCORE ONLY  PHQ-9 Total Score 6 15 0    There are no preventive care reminders to display for this patient.  Health Maintenance Due  Topic Date Due   Zoster Vaccines- Shingrix (1 of 2) 10/14/1954   COVID-19 Vaccine (6 - 2024-25 season) 09/25/2022      History/P.E. limitations: none  There are no preventive  care reminders to display for this patient. There are no preventive care reminders to display for this patient.  Health Maintenance Due  Topic Date Due   Zoster Vaccines- Shingrix (1 of 2) 10/14/1954   COVID-19 Vaccine (6 - 2024-25 season) 09/25/2022     No chief complaint on file.    --------------------------------------------------------------------------------------------------------------------------------------------- Visit Problem List with A/P  No problem-specific Assessment & Plan notes found for this encounter.

## 2023-05-25 NOTE — Progress Notes (Signed)
 Ambulatory walk Start- 97% O2 105 BPM  Finish- 93% O2 75 BPM

## 2023-05-26 ENCOUNTER — Encounter: Payer: Self-pay | Admitting: Family Medicine

## 2023-05-26 NOTE — Assessment & Plan Note (Signed)
 Established problem. Stable. Patient is at goal of decreased cough with control of PND with combination of Flonase /azelastine /ipratropium nasal sprays. Selena [name] attributes her cough resolution to starting Mucinex . No signs of complications, medication side effects, or red flags. Continue current medications and other regiments.

## 2023-05-26 NOTE — Assessment & Plan Note (Signed)
 Established problem that has improved and has meet goal of no further sensation of dizziness.  Dizziness resolved with Dr Jori Newer reduction of amlodipine  dose to 2.5 mg daily. Continue current medication regiment.

## 2023-05-26 NOTE — Assessment & Plan Note (Signed)
 Established problem that has improved. She is back to walking a mile a day.  No change to current prescriptions.  Continue with amlodipine  2.5 mg daily.  Selena [name] declined referral back to pulmonology and referral to pulmonary rehab.

## 2023-05-26 NOTE — Assessment & Plan Note (Signed)
 Established problem Well Controlled. Patient is at goal of <130/80 even with reduction amlodipine  to 2.5 mg daily. No signs of complications, medication side effects, or red flags. Continue current medications and other regiments.

## 2023-05-26 NOTE — Assessment & Plan Note (Signed)
 Established problem resolved Continue current medication regiment.

## 2023-05-26 NOTE — Assessment & Plan Note (Addendum)
 Established problem HR controlled even with exertion (see pulse walking to exam room from waiting room) without negative chronotropic agent. Tolerating apixaban , eGFR 12/2022 > 60, no bleeding.  Will check Renal Function next office with CBC  Selena [name] is seeing if she can receive Medicare Lower Income Subsidy, but is having difficulty getting through to Medicare.  Will ask [employer] to  help Selena [name] with the process of LIS application.

## 2023-06-02 ENCOUNTER — Other Ambulatory Visit: Payer: Self-pay | Admitting: Family Medicine

## 2023-06-05 ENCOUNTER — Emergency Department (HOSPITAL_COMMUNITY)

## 2023-06-05 ENCOUNTER — Encounter: Payer: Self-pay | Admitting: Family Medicine

## 2023-06-05 ENCOUNTER — Other Ambulatory Visit: Payer: Self-pay

## 2023-06-05 ENCOUNTER — Inpatient Hospital Stay (HOSPITAL_COMMUNITY)
Admission: EM | Admit: 2023-06-05 | Discharge: 2023-06-07 | DRG: 196 | Disposition: A | Discharge status: Home / Self Care | Attending: Internal Medicine | Admitting: Internal Medicine

## 2023-06-05 ENCOUNTER — Encounter (HOSPITAL_COMMUNITY): Payer: Self-pay | Admitting: Internal Medicine

## 2023-06-05 DIAGNOSIS — J189 Pneumonia, unspecified organism: Secondary | ICD-10-CM

## 2023-06-05 DIAGNOSIS — Z90710 Acquired absence of both cervix and uterus: Secondary | ICD-10-CM

## 2023-06-05 DIAGNOSIS — I5032 Chronic diastolic (congestive) heart failure: Secondary | ICD-10-CM | POA: Diagnosis present

## 2023-06-05 DIAGNOSIS — R1310 Dysphagia, unspecified: Secondary | ICD-10-CM | POA: Diagnosis present

## 2023-06-05 DIAGNOSIS — Z8042 Family history of malignant neoplasm of prostate: Secondary | ICD-10-CM

## 2023-06-05 DIAGNOSIS — I10 Essential (primary) hypertension: Secondary | ICD-10-CM | POA: Diagnosis present

## 2023-06-05 DIAGNOSIS — Z8711 Personal history of peptic ulcer disease: Secondary | ICD-10-CM

## 2023-06-05 DIAGNOSIS — R0789 Other chest pain: Principal | ICD-10-CM

## 2023-06-05 DIAGNOSIS — H3530 Unspecified macular degeneration: Secondary | ICD-10-CM | POA: Diagnosis present

## 2023-06-05 DIAGNOSIS — N8110 Cystocele, unspecified: Secondary | ICD-10-CM | POA: Insufficient documentation

## 2023-06-05 DIAGNOSIS — Z7901 Long term (current) use of anticoagulants: Secondary | ICD-10-CM

## 2023-06-05 DIAGNOSIS — K219 Gastro-esophageal reflux disease without esophagitis: Secondary | ICD-10-CM | POA: Diagnosis present

## 2023-06-05 DIAGNOSIS — Z85828 Personal history of other malignant neoplasm of skin: Secondary | ICD-10-CM

## 2023-06-05 DIAGNOSIS — Z853 Personal history of malignant neoplasm of breast: Secondary | ICD-10-CM

## 2023-06-05 DIAGNOSIS — Z8616 Personal history of COVID-19: Secondary | ICD-10-CM

## 2023-06-05 DIAGNOSIS — J8410 Pulmonary fibrosis, unspecified: Secondary | ICD-10-CM | POA: Diagnosis present

## 2023-06-05 DIAGNOSIS — J8489 Other specified interstitial pulmonary diseases: Principal | ICD-10-CM | POA: Diagnosis present

## 2023-06-05 DIAGNOSIS — Z96653 Presence of artificial knee joint, bilateral: Secondary | ICD-10-CM | POA: Diagnosis present

## 2023-06-05 DIAGNOSIS — N3281 Overactive bladder: Secondary | ICD-10-CM | POA: Diagnosis present

## 2023-06-05 DIAGNOSIS — J849 Interstitial pulmonary disease, unspecified: Secondary | ICD-10-CM | POA: Diagnosis present

## 2023-06-05 DIAGNOSIS — G4733 Obstructive sleep apnea (adult) (pediatric): Secondary | ICD-10-CM

## 2023-06-05 DIAGNOSIS — Z885 Allergy status to narcotic agent status: Secondary | ICD-10-CM

## 2023-06-05 DIAGNOSIS — I4821 Permanent atrial fibrillation: Secondary | ICD-10-CM | POA: Diagnosis present

## 2023-06-05 DIAGNOSIS — Z79899 Other long term (current) drug therapy: Secondary | ICD-10-CM

## 2023-06-05 DIAGNOSIS — Z87891 Personal history of nicotine dependence: Secondary | ICD-10-CM

## 2023-06-05 DIAGNOSIS — K7460 Unspecified cirrhosis of liver: Secondary | ICD-10-CM | POA: Insufficient documentation

## 2023-06-05 DIAGNOSIS — H903 Sensorineural hearing loss, bilateral: Secondary | ICD-10-CM | POA: Diagnosis present

## 2023-06-05 DIAGNOSIS — I110 Hypertensive heart disease with heart failure: Secondary | ICD-10-CM | POA: Diagnosis present

## 2023-06-05 DIAGNOSIS — E785 Hyperlipidemia, unspecified: Secondary | ICD-10-CM | POA: Insufficient documentation

## 2023-06-05 DIAGNOSIS — Z9011 Acquired absence of right breast and nipple: Secondary | ICD-10-CM

## 2023-06-05 DIAGNOSIS — Z800 Family history of malignant neoplasm of digestive organs: Secondary | ICD-10-CM

## 2023-06-05 HISTORY — DX: Interstitial pulmonary disease, unspecified: J84.9

## 2023-06-05 HISTORY — DX: Pneumonia, unspecified organism: J18.9

## 2023-06-05 LAB — LIPASE, BLOOD: Lipase: 29 U/L (ref 11–51)

## 2023-06-05 LAB — PROTIME-INR
INR: 1.5 — ABNORMAL HIGH (ref 0.8–1.2)
Prothrombin Time: 17.9 s — ABNORMAL HIGH (ref 11.4–15.2)

## 2023-06-05 LAB — CBC WITH DIFFERENTIAL/PLATELET
Abs Immature Granulocytes: 0.05 10*3/uL (ref 0.00–0.07)
Basophils Absolute: 0.1 10*3/uL (ref 0.0–0.1)
Basophils Relative: 0 %
Eosinophils Absolute: 0.4 10*3/uL (ref 0.0–0.5)
Eosinophils Relative: 3 %
HCT: 41.8 % (ref 36.0–46.0)
Hemoglobin: 13.9 g/dL (ref 12.0–15.0)
Immature Granulocytes: 0 %
Lymphocytes Relative: 12 %
Lymphs Abs: 1.4 10*3/uL (ref 0.7–4.0)
MCH: 31.6 pg (ref 26.0–34.0)
MCHC: 33.3 g/dL (ref 30.0–36.0)
MCV: 95 fL (ref 80.0–100.0)
Monocytes Absolute: 1.7 10*3/uL — ABNORMAL HIGH (ref 0.1–1.0)
Monocytes Relative: 14 %
Neutro Abs: 8.1 10*3/uL — ABNORMAL HIGH (ref 1.7–7.7)
Neutrophils Relative %: 71 %
Platelets: 214 10*3/uL (ref 150–400)
RBC: 4.4 MIL/uL (ref 3.87–5.11)
RDW: 13.5 % (ref 11.5–15.5)
WBC: 11.6 10*3/uL — ABNORMAL HIGH (ref 4.0–10.5)
nRBC: 0 % (ref 0.0–0.2)

## 2023-06-05 LAB — COMPREHENSIVE METABOLIC PANEL WITH GFR
ALT: 14 U/L (ref 0–44)
AST: 18 U/L (ref 15–41)
Albumin: 3.2 g/dL — ABNORMAL LOW (ref 3.5–5.0)
Alkaline Phosphatase: 92 U/L (ref 38–126)
Anion gap: 12 (ref 5–15)
BUN: 14 mg/dL (ref 8–23)
CO2: 20 mmol/L — ABNORMAL LOW (ref 22–32)
Calcium: 8.9 mg/dL (ref 8.9–10.3)
Chloride: 106 mmol/L (ref 98–111)
Creatinine, Ser: 0.61 mg/dL (ref 0.44–1.00)
GFR, Estimated: >60 mL/min (ref >60)
Glucose, Bld: 112 mg/dL — ABNORMAL HIGH (ref 70–99)
Potassium: 4 mmol/L (ref 3.5–5.1)
Sodium: 138 mmol/L (ref 135–145)
Total Bilirubin: 0.9 mg/dL (ref 0.0–1.2)
Total Protein: 5.4 g/dL — ABNORMAL LOW (ref 6.5–8.1)

## 2023-06-05 LAB — RESP PANEL BY RT-PCR (RSV, FLU A&B, COVID)  RVPGX2
Influenza A by PCR: NEGATIVE
Influenza B by PCR: NEGATIVE
Resp Syncytial Virus by PCR: NEGATIVE
SARS Coronavirus 2 by RT PCR: NEGATIVE

## 2023-06-05 LAB — TROPONIN I (HIGH SENSITIVITY)
Troponin I (High Sensitivity): 9 ng/L (ref <18)
Troponin I (High Sensitivity): 11 ng/L (ref <18)

## 2023-06-05 MED ORDER — SODIUM CHLORIDE 0.9 % IV SOLN: 2.0000 g | INTRAVENOUS | Status: DC

## 2023-06-05 MED ORDER — MONTELUKAST SODIUM 10 MG PO TABS
10.0000 mg | ORAL_TABLET | Freq: Every day | ORAL | Status: DC
  Administered 2023-06-05 – 2023-06-06 (×2): 10 mg via ORAL
  Filled 2023-06-05 (×2): qty 1

## 2023-06-05 MED ORDER — SODIUM CHLORIDE 0.9 % IV SOLN
2.0000 g | INTRAVENOUS | Status: DC
  Administered 2023-06-05 – 2023-06-07 (×2): 2 g via INTRAVENOUS
  Filled 2023-06-05 (×2): qty 20

## 2023-06-05 MED ORDER — ROSUVASTATIN CALCIUM 5 MG PO TABS
10.0000 mg | ORAL_TABLET | Freq: Every day | ORAL | Status: DC
  Administered 2023-06-05 – 2023-06-07 (×3): 10 mg via ORAL
  Filled 2023-06-05 (×3): qty 2

## 2023-06-05 MED ORDER — ALBUTEROL SULFATE (2.5 MG/3ML) 0.083% IN NEBU
3.0000 mL | INHALATION_SOLUTION | RESPIRATORY_TRACT | Status: DC | PRN
Start: 2023-06-05 — End: 2023-06-07
  Administered 2023-06-05: 3 mL via RESPIRATORY_TRACT
  Filled 2023-06-05: qty 3

## 2023-06-05 MED ORDER — PANTOPRAZOLE SODIUM 40 MG PO TBEC
40.0000 mg | DELAYED_RELEASE_TABLET | Freq: Two times a day (BID) | ORAL | Status: DC
Start: 2023-06-05 — End: 2023-06-07
  Administered 2023-06-05 – 2023-06-07 (×5): 40 mg via ORAL
  Filled 2023-06-05 (×5): qty 1

## 2023-06-05 MED ORDER — AMLODIPINE BESYLATE 5 MG PO TABS
2.5000 mg | ORAL_TABLET | Freq: Every day | ORAL | Status: DC
  Administered 2023-06-05 – 2023-06-06 (×2): 2.5 mg via ORAL
  Filled 2023-06-05 (×2): qty 1

## 2023-06-05 MED ORDER — IOHEXOL 350 MG/ML SOLN
75.0000 mL | Freq: Once | INTRAVENOUS | Status: AC | PRN
  Administered 2023-06-05: 75 mL via INTRAVENOUS

## 2023-06-05 MED ORDER — FAMOTIDINE 20 MG PO TABS
20.0000 mg | ORAL_TABLET | Freq: Every day | ORAL | Status: DC
Start: 2023-06-05 — End: 2023-06-07
  Administered 2023-06-05 – 2023-06-07 (×3): 20 mg via ORAL
  Filled 2023-06-05 (×3): qty 1

## 2023-06-05 MED ORDER — AZITHROMYCIN 500 MG PO TABS: 500.0000 mg | ORAL_TABLET | Freq: Every day | ORAL | Status: DC

## 2023-06-05 MED ORDER — APIXABAN 5 MG PO TABS
5.0000 mg | ORAL_TABLET | Freq: Two times a day (BID) | ORAL | Status: DC
  Administered 2023-06-05 – 2023-06-07 (×5): 5 mg via ORAL
  Filled 2023-06-05 (×5): qty 1

## 2023-06-05 MED ORDER — SODIUM CHLORIDE 0.9 % IV SOLN
1.0000 g | Freq: Once | INTRAVENOUS | Status: AC
  Administered 2023-06-05: 1 g via INTRAVENOUS
  Filled 2023-06-05: qty 10

## 2023-06-05 MED ORDER — IPRATROPIUM BROMIDE 0.06 % NA SOLN
2.0000 | Freq: Four times a day (QID) | NASAL | Status: DC
  Administered 2023-06-05 – 2023-06-07 (×7): 2 via NASAL
  Filled 2023-06-05: qty 15

## 2023-06-05 MED ORDER — ENOXAPARIN SODIUM 40 MG/0.4ML IJ SOSY
40.0000 mg | PREFILLED_SYRINGE | INTRAMUSCULAR | Status: DC
Start: 2023-06-05 — End: 2023-06-05

## 2023-06-05 MED ORDER — AZITHROMYCIN 500 MG PO TABS
500.0000 mg | ORAL_TABLET | Freq: Every day | ORAL | Status: AC
  Administered 2023-06-06 – 2023-06-07 (×2): 500 mg via ORAL
  Filled 2023-06-05 (×2): qty 1

## 2023-06-05 MED ORDER — ENSURE ENLIVE PO LIQD
237.0000 mL | Freq: Two times a day (BID) | ORAL | Status: DC
  Administered 2023-06-06 – 2023-06-07 (×3): 237 mL via ORAL

## 2023-06-05 MED ORDER — SODIUM CHLORIDE 0.9 % IV SOLN
500.0000 mg | Freq: Once | INTRAVENOUS | Status: AC
  Administered 2023-06-05: 500 mg via INTRAVENOUS
  Filled 2023-06-05: qty 5

## 2023-06-05 NOTE — ED Triage Notes (Signed)
 Pt BIB [facility] from home. Pt was asleep and woke up with sudden onset of L side sharp/ shooting chest pain.pt also reports feeling sick all day and had a cough for a week. Denies SOB.

## 2023-06-05 NOTE — Care Management Obs Status (Signed)
 MEDICARE OBSERVATION STATUS NOTIFICATION   Patient Details  Name: Selena [name] MRN: [id number] Date of Birth: [birth year]   Medicare Observation Status Notification Given:  Yes  Obs Letter signed and copy provided  Wynonia Hedges 06/05/2023, 4:06 PM

## 2023-06-05 NOTE — ED Provider Notes (Signed)
 Emergency Department Provider Note   I have reviewed the triage vital signs and the nursing notes.   HISTORY  Chief Complaint Chest Pain   HPI Selena [name] is a 88 y.o. female with past history reviewed below including A-fib on Eliquis  presents emergency department with acute onset left-sided chest pain.  Pain feels sharp and worse with coughing although present also at rest.  She has pulmonary fibrosis and frequent, severe coughing episodes.  She was having some productive cough last week but that has slowed and become more dry.  No current shortness of breath.  No rash.  No injury.    Past Medical History:  Diagnosis Date   Acute gastric ulcer with hemorrhage    Acute on chronic diastolic congestive heart failure ([name]) 10/16/2018   AKI (acute kidney injury) ([name])    Anemia    PMH   Aneurysm (arteriovenous) of coronary vessels    Ankle edema, bilateral 01/08/2021   Atrial fibrillation ([name]) 01/01/2007   BACK PAIN, UPPER 07/09/2008   Breast cancer of upper-outer quadrant of right female breast ([name]) 03/21/2011   Breast cancer, stage 1 ([name])    Bruises easily    Cataract    history   Complication of anesthesia    COVID-19 virus infection 03/08/2020   Diverticulitis    DIVERTICULOSIS, COLON 12/26/2006   Essential hypertension 10/27/2017   Follicular 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 ([name]) 07/31/2014   Follicular non-Hodgkin's lymphoma ([name])    Frozen shoulder, Left 01/08/2021   Full dentures 01/08/2021   Goiter    multi-nodular   Gustatory rhinitis 01/07/2021   Hemorrhoids 04/08/2021   History of atrioventricular nodal ablation 10/16/2018   History of cardiac radiofrequency ablation 01/25/2007   Hx of colonoscopy 2009   Incontinence of feces 04/08/2021   Influenza 12/18/2020   Insomnia 09/10/2014   LIVER FUNCTION TESTS, ABNORMAL, HX OF 12/26/2007   Macular degeneration, bilateral 01/08/2021   Memory loss 05/16/2007   Mild cognitive  impairment 09/23/2021   Qualifier: Diagnosis of  By: Minnette Amato  MD, Ronie Cohen    Multiple gastric ulcers 2021   CC Hematemesis; EGD showed mild abnormalities in the esophagus and multiple gastric ulcers   Nocturnal hypoxemia 05/14/2020   OSTEOARTHRITIS 12/26/2006   takes Diclofenac  daily but has stopped for surgery   Osteoarthritis 12/26/2006   Qualifier: Diagnosis of  By: Rachell Budge, RN, Arlena Belts    Pain in right knee 11/28/2022   Paraspinal mass 07/16/2014   Pneumonia 11/11/2017   Pneumonia 01/31/2018   Pneumonia 01/16/2023   Pneumonia due to COVID-19 virus 03/08/2020   PONV (postoperative nausea and vomiting)    S/P ablation of atrial flutter 07/17/2018   Sensorineural hearing loss (SNHL) of both ears 01/07/2021   Sepsis ([name]) 04/02/2020   Shortness of breath 05/28/2019   Skin cancer    Syncope 08/21/2018   Thoracic aortic aneurysm ([name]) 10/16/2018   Tibialis posterior tendinopathy 04/08/2019   Unspecified disorder of synovium and tendon, right lower leg 04/08/2019   Upper airway cough syndrome 10/27/2017   pfts 10/10/17 s sign airflow obst though the insp loop is somewhat flattened  - Allergy  profile 10/26/2017 >  Eos 0.3 /  IgE  6 RAST neg - rx for gerd 10/26/17 > improved 12/07/2017    Urinary frequency 03/22/2022   Vasomotor rhinitis 01/08/2021   VERTIGO 09/07/2009   Has had Vestibular Rehab consultation   Visual impairment 01/07/2021   Wears dentures    Wears glasses  Wears hearing aid    bilateral    Review of Systems  Constitutional: No fever/chills Cardiovascular: Positive chest pain. Respiratory: Denies shortness of breath. Gastrointestinal: No abdominal pain. Positive nausea, no vomiting.   Skin: Negative for rash. Neurological: Negative for headaches. ____________________________________________   PHYSICAL EXAM:  VITAL SIGNS: ED Triage Vitals  Encounter Vitals Group     BP 06/05/23 0230 130/87     Pulse Rate 06/05/23 0230 (!) 103     Resp 06/05/23 0230  (!) 21     Temp 06/05/23 0231 98.7 F (37.1 C)     Temp Source 06/05/23 0231 Oral     SpO2 06/05/23 0227 97 %     Weight 06/05/23 0231 176 lb (79.8 kg)     Height 06/05/23 0231 5\' 5"  (1.651 m)   Constitutional: Alert and oriented. Well appearing and in no acute distress. Eyes: Conjunctivae are normal.  Head: Atraumatic. Nose: No congestion/rhinnorhea. Mouth/Throat: Mucous membranes are moist.  Neck: No stridor.   Cardiovascular: Tachycardia. Good peripheral circulation. Grossly normal heart sounds.   Respiratory: Normal respiratory effort.  No retractions. Lungs CTAB. Gastrointestinal: Soft and nontender. No distention.  Musculoskeletal: No lower extremity tenderness nor edema. No gross deformities of extremities. Neurologic:  Normal speech and language.  Skin:  Skin is warm, dry and intact. No rash noted.  ____________________________________________   LABS (all labs ordered are listed, but only abnormal results are displayed)  Labs Reviewed  COMPREHENSIVE METABOLIC PANEL WITH GFR  LIPASE, BLOOD  CBC WITH DIFFERENTIAL/PLATELET  TROPONIN I (HIGH SENSITIVITY)   ____________________________________________  EKG   EKG Interpretation Date/Time:  Monday Jun 05 2023 02:46:47 EDT Ventricular Rate:  101 PR Interval:    QRS Duration:  96 QT Interval:  346 QTC Calculation: 449 R Axis:   66  Text Interpretation: Atrial fibrillation Confirmed by Abby Hocking [phone]) on 06/05/2023 2:55:32 AM        ____________________________________________  RADIOLOGY  No results found.  ____________________________________________   PROCEDURES  Procedure(s) performed:   Procedures   ____________________________________________   INITIAL IMPRESSION / ASSESSMENT AND PLAN / ED COURSE  Pertinent labs & imaging results that were available during my care of the patient were reviewed by me and considered in my medical decision making (see chart for details).   This patient is  Presenting for Evaluation of CP, which does require a range of treatment options, and is a complaint that involves a high risk of morbidity and mortality.  The Differential Diagnoses includes but is not exclusive to acute coronary syndrome, aortic dissection, pulmonary embolism, cardiac tamponade, community-acquired pneumonia, pericarditis, musculoskeletal chest wall pain, etc.   Critical Interventions-    Medications - No data to display  Reassessment after intervention:     I did obtain Additional Historical Information from family at bedside.   Clinical Laboratory Tests Ordered, included CBC with leukocytosis. No anemia. No AKI. LFTs normal.   Radiologic Tests Ordered, included CXR. I independently interpreted the images and agree with radiology interpretation.   Cardiac Monitor Tracing which shows NSR.    Social Determinants of Health Risk patient is a non-smoker.   Consult complete with  Medical Decision Making: Summary:  Patient presents to the ED with atypical CP. Plan for CP workup and reassess.   Reevaluation with update and discussion with   ***Considered admission***  Patient's presentation is most consistent with acute presentation with potential threat to life or bodily function.   Disposition:   ____________________________________________  FINAL CLINICAL IMPRESSION(S) / ED  DIAGNOSES  Final diagnoses:  None     NEW OUTPATIENT MEDICATIONS STARTED DURING THIS VISIT:  New Prescriptions   No medications on file    Note:  This document was prepared using Dragon voice recognition software and may include unintentional dictation errors.  Abby Hocking, MD, [facility]

## 2023-06-05 NOTE — H&P (Signed)
 History and Physical    Patient: Selena [name] RUE:[id number] DOB: 1935-03-05 DOA: 06/05/2023 DOS: the patient was seen and examined on 06/05/2023 PCP: McDiarmid, Demetra Filter, MD  Patient coming from: Home  Chief Complaint:  Chief Complaint  Patient presents with   Chest Pain   HPI: Selena [name] is a 88 y.o. female with medical history significant of HTN, HLD, Afib, HFpEF, remote R breast cancer s/p mastectomy/reconstruction, and GERD p/w acute chest pain and found to have multifocal PNA.  Pt states that she was in her USOH until Sunday when she began to experience L sided chest and back pain. Pt didn't know what to make of the pain, but noted she felt unwell and went to bed at 8p which is not normal for her. Of note, she does endorse a cough for the past week with thickened sputum, but no blood or fevers. She denies any sick contacts.  In the ED, pt labs notable for and CT demonstrated multifocal PNA and possible ILD. PT admitted to medicine for ongoing care.  Review of Systems: As mentioned in the history of present illness. All other systems reviewed and are negative. Past Medical History:  Diagnosis Date   Acute gastric ulcer with hemorrhage    Acute on chronic diastolic congestive heart failure ([name]) 10/16/2018   AKI (acute kidney injury) ([name])    Anemia    PMH   Aneurysm (arteriovenous) of coronary vessels    Ankle edema, bilateral 01/08/2021   Atrial fibrillation ([name]) 01/01/2007   BACK PAIN, UPPER 07/09/2008   Breast cancer of upper-outer quadrant of right female breast ([name]) 03/21/2011   Breast cancer, stage 1 ([name])    Bruises easily    Cataract    history   Complication of anesthesia    COVID-19 virus infection 03/08/2020   Diverticulitis    DIVERTICULOSIS, COLON 12/26/2006   Essential hypertension 10/27/2017   Follicular 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 ([name]) 07/31/2014   Follicular non-Hodgkin's lymphoma ([name])    Frozen shoulder, Left  01/08/2021   Full dentures 01/08/2021   Goiter    multi-nodular   Gustatory rhinitis 01/07/2021   Hemorrhoids 04/08/2021   History of atrioventricular nodal ablation 10/16/2018   History of cardiac radiofrequency ablation 01/25/2007   Hx of colonoscopy 2009   Incontinence of feces 04/08/2021   Influenza 12/18/2020   Insomnia 09/10/2014   LIVER FUNCTION TESTS, ABNORMAL, HX OF 12/26/2007   Macular degeneration, bilateral 01/08/2021   Memory loss 05/16/2007   Mild cognitive impairment 09/23/2021   Qualifier: Diagnosis of  By: Minnette Amato  MD, Ronie Cohen    Multiple gastric ulcers 2021   CC Hematemesis; EGD showed mild abnormalities in the esophagus and multiple gastric ulcers   Nocturnal hypoxemia 05/14/2020   OSTEOARTHRITIS 12/26/2006   takes Diclofenac  daily but has stopped for surgery   Osteoarthritis 12/26/2006   Qualifier: Diagnosis of  By: Rachell Budge, RN, Arlena Belts    Pain in right knee 11/28/2022   Paraspinal mass 07/16/2014   Pneumonia 11/11/2017   Pneumonia 01/31/2018   Pneumonia 01/16/2023   Pneumonia due to COVID-19 virus 03/08/2020   PONV (postoperative nausea and vomiting)    S/P ablation of atrial flutter 07/17/2018   Sensorineural hearing loss (SNHL) of both ears 01/07/2021   Sepsis ([name]) 04/02/2020   Shortness of breath 05/28/2019   Skin cancer    Syncope 08/21/2018   Thoracic aortic aneurysm ([name]) 10/16/2018   Tibialis posterior tendinopathy 04/08/2019   Unspecified disorder of synovium  and tendon, right lower leg 04/08/2019   Upper airway cough syndrome 10/27/2017   pfts 10/10/17 s sign airflow obst though the insp loop is somewhat flattened  - Allergy  profile 10/26/2017 >  Eos 0.3 /  IgE  6 RAST neg - rx for gerd 10/26/17 > improved 12/07/2017    Urinary frequency 03/22/2022   Vasomotor rhinitis 01/08/2021   VERTIGO 09/07/2009   Has had Vestibular Rehab consultation   Visual impairment 01/07/2021   Wears dentures    Wears glasses    Wears hearing aid    bilateral    Past Surgical History:  Procedure Laterality Date   ABDOMINAL HYSTERECTOMY     ABLATION OF DYSRHYTHMIC FOCUS  2009   APPENDECTOMY     AUGMENTATION MAMMAPLASTY Right 2013   Subpectoral implant   BIOPSY  05/07/2019   Procedure: BIOPSY;  Surgeon: Normie Becton., MD;  Location: [facility];  Service: Gastroenterology;;   BREAST BIOPSY  2013   BREAST RECONSTRUCTION  04/14/2011   Procedure: BREAST RECONSTRUCTION;  Surgeon: Elidia Grout, MD;  [facility];  Service: Plastics;  Laterality: Right;  Placement of Right Breast Tissue Expander with  use of Flex HD for Breast Reconstruction   BREAST SURGERY     right sm, snbx   CATARACT EXTRACTION W/ INTRAOCULAR LENS  IMPLANT, BILATERAL  2010   bilateral   COLONOSCOPY     COLONOSCOPY     ESOPHAGOGASTRODUODENOSCOPY (EGD) WITH PROPOFOL  N/A 05/07/2019   Procedure: ESOPHAGOGASTRODUODENOSCOPY (EGD) WITH PROPOFOL ;  Surgeon: Normie Becton., MD;  Location: [facility];  Service: Gastroenterology;  Laterality: N/A;   KNEE SURGERY  2004/2010   arthroscopic/ bilateral knee replacements   LOOP RECORDER IMPLANT     MASTECTOMY Right 2013   PORT-A-CATH REMOVAL N/A 12/07/2016   Procedure: REMOVAL PORT-A-CATH;  Surgeon: Enid Harry, MD;  Location: [facility] OR;  Service: General;  Laterality: N/A;   PORTACATH PLACEMENT Right 08/11/2014   Procedure: INSERTION PORT-A-CATH WITH ULTRASOUND;  Surgeon: Enid Harry, MD;  Location: [facility] OR;  Service: General;  Laterality: Right;   ROTATOR CUFF REPAIR  1999   right   tmi  1998   TONSILLECTOMY     TOTAL KNEE ARTHROPLASTY Bilateral    Social History:  reports that she quit smoking about 45 years ago. Her smoking use included cigarettes. She started smoking about 65 years ago. She has a 2 pack-year smoking history. She has been exposed to tobacco smoke. She has never used smokeless tobacco. She reports that she does not drink alcohol and does not use drugs.  Allergies  Allergen Reactions    Codeine  Other (See Comments)    Hallucinations.  Pt states she can take this now that she can no longer see due to Macular degeneration  Other Reaction(s): Other  Hallucinations.  Pt states she can take this now that she can no longer see due to Macular degeneration     Hallucinations   Codeine  Phosphate Other (See Comments)    Hallucinations   Adhesive [Tape] Other (See Comments)    Electrodes for EKG- skin blistering, erythema    Family History  Problem Relation Age of Onset   Colon cancer Mother    Cancer Mother        colon   Colon cancer Maternal Aunt    Cancer Maternal Uncle    Prostate cancer Maternal Uncle    Prostate cancer Maternal Uncle    Other Maternal Uncle    Cancer Sister  kidney   Cancer Brother        lymph node   Anesthesia problems Neg Hx    Hypotension Neg Hx    Malignant hyperthermia Neg Hx    Pseudochol deficiency Neg Hx    Breast cancer Neg Hx     Prior to Admission medications   Medication Sig Start Date End Date Taking? Authorizing Provider  albuterol  (VENTOLIN  HFA) 108 (90 Base) MCG/ACT inhaler INHALE 2 PUFFS INTO THE LUNGS EVERY 4 HOURS AS NEEDED FOR WHEEZING OR SHORTNESS OF BREATH 05/22/23  Yes McDiarmid, Demetra Filter, MD  amLODipine  (NORVASC ) 2.5 MG tablet Take 1 tablet (2.5 mg total) by mouth at bedtime. 05/02/23  Yes McDiarmid, Demetra Filter, MD  apixaban  (ELIQUIS ) 5 MG TABS tablet TAKE 1 TABLET(5 MG) BY MOUTH TWICE DAILY 01/31/23  Yes Swinyer, Leilani Punter, NP  azelastine  (ASTELIN ) 0.1 % nasal spray PALCE 2 SPRAYS INTO BOTH NOSTRILS ONCE DAILY FOR 1 DOSE. 10/19/22  Yes McDiarmid, Demetra Filter, MD  famotidine  (PEPCID ) 20 MG tablet TAKE 1 TABLET(20 MG) BY MOUTH AT BEDTIME 03/04/23  Yes Kennedy-Smith, Colleen M, NP  guaiFENesin  (MUCINEX ) 600 MG 12 hr tablet Take 1 tablet (600 mg total) by mouth 2 (two) times daily as needed. 05/26/23  Yes McDiarmid, Todd D, MD  ipratropium (ATROVENT ) 0.06 % nasal spray Place 2 sprays into both nostrils 4 (four) times daily. 04/27/23  Yes  McDiarmid, Demetra Filter, MD  montelukast  (SINGULAIR ) 10 MG tablet TAKE 1 TABLET(10 MG) BY MOUTH AT BEDTIME 06/02/23  Yes Candee Cha, MD  pantoprazole  (PROTONIX ) 40 MG tablet Take 1 tablet (40 mg total) by mouth 2 (two) times daily. 04/18/23  Yes Kennedy-Smith, Colleen M, NP  rosuvastatin  (CRESTOR ) 10 MG tablet Take 1 tablet (10 mg total) by mouth daily. 02/22/23  Yes Swinyer, Leilani Punter, NP  doxepin  (SINEQUAN ) 10 MG/ML solution Take 1 mL (10 mg total) by mouth at bedtime as needed for sleep. Patient not taking: Reported on 06/05/2023 04/27/23   McDiarmid, Demetra Filter, MD    Physical Exam: Vitals:   06/05/23 0445 06/05/23 0615 06/05/23 0616 06/05/23 0752  BP: 133/60 (!) 141/72  115/78  Pulse: 93 90  82  Resp: (!) 21 15  18   Temp:   97.8 F (36.6 C) 98.1 F (36.7 C)  TempSrc:   Oral Oral  SpO2: 99% 100%  98%  Weight:      Height:       General: Alert, oriented x3, resting comfortably in no acute distress Respiratory: Bibasilar rhonchi (L>R); no wheezing Cardiovascular: Regular rate and rhythm w/o m/r/g  Data Reviewed:  Lab Results  Component Value Date   WBC 11.6 (H) 06/05/2023   HGB 13.9 06/05/2023   HCT 41.8 06/05/2023   MCV 95.0 06/05/2023   PLT 214 06/05/2023   Lab Results  Component Value Date   GLUCOSE 112 (H) 06/05/2023   CALCIUM  8.9 06/05/2023   NA 138 06/05/2023   K 4.0 06/05/2023   CO2 20 (L) 06/05/2023   CL 106 06/05/2023   BUN 14 06/05/2023   CREATININE 0.61 06/05/2023   Lab Results  Component Value Date   ALT 14 06/05/2023   AST 18 06/05/2023   ALKPHOS 92 06/05/2023   BILITOT 0.9 06/05/2023   Lab Results  Component Value Date   INR 1.1 05/06/2019   INR 0.98 07/24/2014   INR 1.4 02/02/2008    Radiology: CT Angio Chest/Abd/Pel for Dissection W and/or Wo Contrast Result Date: 06/05/2023 CLINICAL DATA:  Acute aortic  syndrome.  Chest pain. EXAM: CT ANGIOGRAPHY CHEST, ABDOMEN AND PELVIS TECHNIQUE: Non-contrast CT of the chest was initially obtained.  Multidetector CT imaging through the chest, abdomen and pelvis was performed using the standard protocol during bolus administration of intravenous contrast. Multiplanar reconstructed images and MIPs were obtained and reviewed to evaluate the vascular anatomy. RADIATION DOSE REDUCTION: This exam was performed according to the departmental dose-optimization program which includes automated exposure control, adjustment of the mA and/or kV according to patient size and/or use of iterative reconstruction technique. CONTRAST:  75mL OMNIPAQUE  IOHEXOL  350 MG/ML SOLN COMPARISON:  Chest abdomen pelvis CT 06/27/2022 FINDINGS: CTA CHEST FINDINGS Cardiovascular: Pre contrast imaging shows no hyperdense crescent in the wall of the thoracic aorta to suggest the presence of an acute intramural hematoma. Ascending thoracic aorta measures up to 4.0 cm diameter. No dissection flap within the thoracic aorta. Arterial aortic arch branch vessel anatomy is widely patent. Mild atherosclerotic calcification is noted in the wall of the thoracic aorta. No large central pulmonary embolus in the main or lobar pulmonary arteries. No findings to suggest segmental pulmonary embolus although assessment is limited by bolus timing. Mediastinum/Nodes: No mediastinal lymphadenopathy. 2.9 cm left thyroid  nodule evident with other smaller bilateral thyroid  nodules present. This has been evaluated on previous imaging. (ref: J Am Coll Radiol. 2015 Feb;12(2): 143-50).There is no hilar lymphadenopathy. Small hiatal hernia. The esophagus has normal imaging features. There is no axillary lymphadenopathy. Lungs/Pleura: Areas of subpleural reticulation and subpleural patchy ground-glass attenuation are noted in the lungs bilaterally, suggesting background chronic interstitial lung disease, similar to mildly progressive in the interval. There is a cluster of small ground-glass opacities in the peripheral right mid lung (57/7) new in the interval patchy and  nodular consolidative opacities seen in the inferior left upper lobe and lingula (image 80/7), new since prior. 18 mm area of nodular consolidative disease is seen in the paraspinal right lower lobe on 96/7. No pleural effusion. Musculoskeletal: No worrisome lytic or sclerotic osseous abnormality. Review of the MIP images confirms the above findings. CTA ABDOMEN AND PELVIS FINDINGS VASCULAR Aorta: Normal caliber aorta without aneurysm, dissection, vasculitis or significant stenosis. Moderate to advanced atherosclerosis. Celiac: Patent without evidence of aneurysm, dissection, vasculitis or significant stenosis. SMA: Patent without evidence of aneurysm, dissection, vasculitis or significant stenosis. Replaced right hepatic artery. Renals: Both renal arteries are patent without evidence of aneurysm, dissection, vasculitis, fibromuscular dysplasia or significant stenosis. Accessory right renal artery. IMA: Patent without evidence of aneurysm, dissection, vasculitis or significant stenosis. Inflow: Patent without evidence of aneurysm, dissection, vasculitis or significant stenosis. Veins: No obvious venous abnormality within the limitations of this arterial phase study. Review of the MIP images confirms the above findings. NON-VASCULAR Hepatobiliary: No suspicious focal abnormality within the liver parenchyma. Subtle nodularity of liver contour suggests cirrhosis. There is no evidence for gallstones, gallbladder wall thickening, or pericholecystic fluid. No intrahepatic or extrahepatic biliary dilation. Pancreas: No focal mass lesion. No dilatation of the main duct. No intraparenchymal cyst. No peripancreatic edema. Spleen: No splenomegaly. No suspicious focal mass lesion. Adrenals/Urinary Tract: No adrenal nodule or mass. Kidneys unremarkable. No evidence for hydroureter. The urinary bladder appears normal for the degree of distention. Stomach/Bowel: Small hiatal hernia. Stomach otherwise unremarkable. Duodenum is  normally positioned as is the ligament of Treitz. No small bowel wall thickening. No small bowel dilatation. The terminal ileum is normal. The appendix is not well visualized, but there is no edema or inflammation in the region of the cecal tip to suggest  appendicitis. No gross colonic mass. No colonic wall thickening. Diverticular changes are noted in the left colon without evidence of diverticulitis. Lymphatic: There is no gastrohepatic or hepatoduodenal ligament lymphadenopathy. No retroperitoneal or mesenteric lymphadenopathy. No pelvic sidewall lymphadenopathy. Reproductive: Hysterectomy.  There is no adnexal mass. Other: No intraperitoneal free fluid. Musculoskeletal: Pelvic floor laxity with cystocele. No worrisome lytic or sclerotic osseous abnormality. Degenerative disc disease noted lumbar spine. Review of the MIP images confirms the above findings. IMPRESSION: 1. No evidence for thoracoabdominal aortic aneurysm. No thoracoabdominal aortic dissection or acute intramural hematoma. 2. Areas of subpleural reticulation and subpleural patchy ground-glass attenuation in the lungs bilaterally, suggesting background chronic interstitial lung disease, similar to mildly progressive in the interval. 3. New cluster of patchy/nodular ground-glass opacity in the peripheral right lung and new patchy and nodular consolidative opacities in the inferior left upper lobe and lingula. 18 mm area of nodular consolidative disease in the paraspinal right lower lobe. Imaging features are compatible with multifocal pneumonia but given nodular character in some regions warrant close follow-up. Repeat CT chest in 3 months recommended. 4. Subtle nodularity of liver contour suggests cirrhosis. 5. Small hiatal hernia. 6. Left colonic diverticulosis without diverticulitis. 7. Pelvic floor laxity with cystocele. 8.  Aortic Atherosclerosis (ICD10-I70.0). Electronically Signed   By: Donnal Fusi M.D.   On: 06/05/2023 05:59   DG Chest 2  View Result Date: 06/05/2023 CLINICAL DATA:  Initial evaluation for acute chest pain. EXAM: CHEST - 2 VIEW COMPARISON:  Radiograph from 01/16/2023 FINDINGS: Cardiomegaly. Mediastinal silhouette within normal limits. Aortic atherosclerosis. Lungs normally inflated. Chronic coarsening of the interstitial markings with architectural distortion involving the mid and lower lungs, consistent with underlying fibrotic changes as seen on prior exams. Overlying vascular and interstitial prominence, suggesting superimposed mild pulmonary interstitial congestion/edema. Trace fluid noted along the right minor fissure. No consolidative airspace disease. No pneumothorax. Visualized osseous structures demonstrate no acute finding. IMPRESSION: 1. Cardiomegaly with mild pulmonary interstitial congestion/edema. 2. Underlying fibrotic changes involving the mid and lower lungs. 3. Aortic Atherosclerosis (ICD10-I70.0). Electronically Signed   By: Virgia Griffins M.D.   On: 06/05/2023 04:15     Assessment and Plan: 15F h/o HTN, HLD, Afib, HFpEF, remote R breast cancer s/p mastectomy/reconstruction, and GERD p/w acute chest pain and found to have multifocal PNA.  #Multifocal PNA, new, POA Pt presented with L cp and CT demonstrated multifocal PNA. On RA. -Continue IV CTX 1g daily x4 days for 5 day CAP course -Continue PO azithromycin  500mg  daily x 2 days for 3 day CAP course  #ILD, new, POA Pt reports being aware of this condition. Endorses working in [employer] rights [employer] for years. Denies smoking. -OP f/u with pulmnology at discharge  #Cirrhosis of liver on imaging, new, POA Pt denies EtOH use. -OP f/u with hepatology -F/u INR  #Afib -PTA apixaban  5mg  BID  #H/o HTN -PTA amlodipine  and   #H/o GERD -PTA PPI   Advance Care Planning:   Code Status: Full Code   Consults: N/A  Family Communication: Daughter Ammon Bales updated at bedside.  Severity of Illness: The appropriate patient status for this patient is  OBSERVATION. Observation status is judged to be reasonable and necessary in order to provide the required intensity of service to ensure the patient's safety. The patient's presenting symptoms, physical exam findings, and initial radiographic and laboratory data in the context of their medical condition is felt to place them at decreased risk for further clinical deterioration. Furthermore, it is anticipated that the patient will be  medically stable for discharge from the hospital within 2 midnights of admission.    ------- I spent 55 minutes reviewing previous labs/notes, obtaining separate history at the bedside, counseling/discussing the treatment plan outlined above, ordering medications/tests, and performing clinical documentation.  Author: Arne Langdon, MD 06/05/2023 8:26 AM  For on call review [email or web address].

## 2023-06-06 ENCOUNTER — Observation Stay (HOSPITAL_COMMUNITY)

## 2023-06-06 ENCOUNTER — Ambulatory Visit (HOSPITAL_COMMUNITY)

## 2023-06-06 DIAGNOSIS — R0789 Other chest pain: Secondary | ICD-10-CM | POA: Diagnosis present

## 2023-06-06 DIAGNOSIS — I5032 Chronic diastolic (congestive) heart failure: Secondary | ICD-10-CM | POA: Diagnosis present

## 2023-06-06 DIAGNOSIS — Z885 Allergy status to narcotic agent status: Secondary | ICD-10-CM | POA: Diagnosis not present

## 2023-06-06 DIAGNOSIS — Z9011 Acquired absence of right breast and nipple: Secondary | ICD-10-CM | POA: Diagnosis not present

## 2023-06-06 DIAGNOSIS — J849 Interstitial pulmonary disease, unspecified: Secondary | ICD-10-CM | POA: Diagnosis not present

## 2023-06-06 DIAGNOSIS — I110 Hypertensive heart disease with heart failure: Secondary | ICD-10-CM | POA: Diagnosis present

## 2023-06-06 DIAGNOSIS — Z8616 Personal history of COVID-19: Secondary | ICD-10-CM | POA: Diagnosis not present

## 2023-06-06 DIAGNOSIS — G4733 Obstructive sleep apnea (adult) (pediatric): Secondary | ICD-10-CM | POA: Diagnosis present

## 2023-06-06 DIAGNOSIS — Z8042 Family history of malignant neoplasm of prostate: Secondary | ICD-10-CM | POA: Diagnosis not present

## 2023-06-06 DIAGNOSIS — J8410 Pulmonary fibrosis, unspecified: Secondary | ICD-10-CM | POA: Diagnosis present

## 2023-06-06 DIAGNOSIS — I4821 Permanent atrial fibrillation: Secondary | ICD-10-CM | POA: Diagnosis present

## 2023-06-06 DIAGNOSIS — H903 Sensorineural hearing loss, bilateral: Secondary | ICD-10-CM | POA: Diagnosis present

## 2023-06-06 DIAGNOSIS — H3530 Unspecified macular degeneration: Secondary | ICD-10-CM | POA: Diagnosis present

## 2023-06-06 DIAGNOSIS — K7460 Unspecified cirrhosis of liver: Secondary | ICD-10-CM | POA: Insufficient documentation

## 2023-06-06 DIAGNOSIS — J8489 Other specified interstitial pulmonary diseases: Secondary | ICD-10-CM | POA: Diagnosis present

## 2023-06-06 DIAGNOSIS — Z96653 Presence of artificial knee joint, bilateral: Secondary | ICD-10-CM | POA: Diagnosis present

## 2023-06-06 DIAGNOSIS — N3281 Overactive bladder: Secondary | ICD-10-CM | POA: Diagnosis present

## 2023-06-06 DIAGNOSIS — J189 Pneumonia, unspecified organism: Secondary | ICD-10-CM | POA: Diagnosis present

## 2023-06-06 DIAGNOSIS — E785 Hyperlipidemia, unspecified: Secondary | ICD-10-CM | POA: Diagnosis present

## 2023-06-06 DIAGNOSIS — Z85828 Personal history of other malignant neoplasm of skin: Secondary | ICD-10-CM | POA: Diagnosis not present

## 2023-06-06 DIAGNOSIS — Z7901 Long term (current) use of anticoagulants: Secondary | ICD-10-CM | POA: Diagnosis not present

## 2023-06-06 DIAGNOSIS — Z800 Family history of malignant neoplasm of digestive organs: Secondary | ICD-10-CM | POA: Diagnosis not present

## 2023-06-06 DIAGNOSIS — R1310 Dysphagia, unspecified: Secondary | ICD-10-CM | POA: Diagnosis present

## 2023-06-06 DIAGNOSIS — Z87891 Personal history of nicotine dependence: Secondary | ICD-10-CM | POA: Diagnosis not present

## 2023-06-06 DIAGNOSIS — K219 Gastro-esophageal reflux disease without esophagitis: Secondary | ICD-10-CM | POA: Diagnosis present

## 2023-06-06 DIAGNOSIS — Z853 Personal history of malignant neoplasm of breast: Secondary | ICD-10-CM | POA: Diagnosis not present

## 2023-06-06 LAB — SEDIMENTATION RATE: Sed Rate: 24 mm/h — ABNORMAL HIGH (ref 0–22)

## 2023-06-06 LAB — PROCALCITONIN: Procalcitonin: <0.1 ng/mL

## 2023-06-06 MED ORDER — ACETAMINOPHEN 325 MG PO TABS
650.0000 mg | ORAL_TABLET | ORAL | Status: DC | PRN
  Administered 2023-06-06 – 2023-06-07 (×2): 650 mg via ORAL
  Filled 2023-06-06 (×2): qty 2

## 2023-06-06 MED ORDER — GUAIFENESIN-DM 100-10 MG/5ML PO SYRP: 5.0000 mL | ORAL_SOLUTION | ORAL | Status: DC | PRN

## 2023-06-06 MED ORDER — PREDNISONE 20 MG PO TABS
40.0000 mg | ORAL_TABLET | Freq: Every day | ORAL | Status: DC
  Administered 2023-06-06 – 2023-06-07 (×2): 40 mg via ORAL
  Filled 2023-06-06 (×2): qty 2

## 2023-06-06 MED ORDER — PREDNISONE 20 MG PO TABS: 20.0000 mg | ORAL_TABLET | Freq: Every day | ORAL | Status: DC

## 2023-06-06 MED ORDER — PREDNISONE 20 MG PO TABS: 50.0000 mg | ORAL_TABLET | Freq: Every day | ORAL | Status: DC

## 2023-06-06 MED ORDER — FLUTICASONE FUROATE-VILANTEROL 100-25 MCG/ACT IN AEPB
1.0000 | INHALATION_SPRAY | Freq: Every day | RESPIRATORY_TRACT | Status: DC
  Administered 2023-06-06 – 2023-06-07 (×2): 1 via RESPIRATORY_TRACT
  Filled 2023-06-06 (×2): qty 28

## 2023-06-06 NOTE — Assessment & Plan Note (Signed)
 -  Continue nightly CPAP

## 2023-06-06 NOTE — Hospital Course (Signed)
 Selena [name] is a 88 y.o. female with medical history significant of HTN, HLD, Afib, HFpEF, remote R breast cancer s/p mastectomy/reconstruction, and GERD p/w acute chest pain and found to have multifocal PNA.   Pt states that she was in her USOH until Sunday when she began to experience L sided chest and back pain. Pt didn't know what to make of the pain, but noted she felt unwell and went to bed at 8p which is not normal for her. Of note, she does endorse a cough for the past week with thickened sputum, but no blood or fevers. She denies any sick contacts.   CT demonstrated multifocal PNA and underlying known ILD.  Started on antibiotics and admitted for further workup and monitoring.

## 2023-06-06 NOTE — Assessment & Plan Note (Signed)
-   Continue Eliquis  - Rate controlled without need for agent

## 2023-06-06 NOTE — Assessment & Plan Note (Signed)
 Continue Crestor

## 2023-06-06 NOTE — Progress Notes (Signed)
 Patient educated on flutter. Demonstrated great effort.

## 2023-06-06 NOTE — Procedures (Signed)
 Modified Barium Swallow Study  Patient Details  Name: Selena [name] MRN: [id number] Date of Birth: 09/01/1935  Today's Date: 06/06/2023  Modified Barium Swallow completed.  Full report located under Chart Review in the Imaging Section.  History of Present Illness Patient is an 88 y.o. female with PMH: HTN, HLD, a-fib, GERD, remote breast cancer s/p mastectomy/reconstruction. She presented to the hospital on 06/05/23 with acute chest pain and found to have multifocal PNA. MBS with SLP ordered to r/o chronic silent aspiration.   Clinical Impression Patient presents with an oropharyngeal swallow that is [facility] as per this MBS. No incidents of aspiration occured during any phase of the swallow with any of the tested liquid or solid consistencies of barium. Prominent cricopharyngeal bar observed which did not appear to significantly impede the flow of barium. Anterior hyoid excursion and laryngeal elevation were both partial in completion but patient with complete epiglottic inversion and laryngeal vestibule closure. Swallow was initiated at level of pyriform sinuses with thin liquids and initiated at level of vallecular sinus with puree and mechanical soft solids and honey thick liquids. Flash penetration (PAS 2) that remained above the vocal cords and fully exited laryngeal vestibule occured with thin liquids. Barium residuals were generally observed on lingual surface and cleared with subsequent swallows. No significant amount of pharyngeal residuals observed with any of the tested consistencies. 13mm barium tablet taken with thin liquid barium transited through PES and esophagus without any observed difficulty. No retrograde movement of barium observed at or below PES and no barium stasis observed during esophageal sweep. SLP is recommending to continue with current PO diet and no further skilled intervention warranted at this time. Factors that may increase risk of adverse event in presence of  aspiration Roderick Civatte & Jessy Morocco 2021):    Swallow Evaluation Recommendations Recommendations: PO diet PO Diet Recommendation: Regular;Thin liquids (Level 0) Liquid Administration via: Cup;Straw Medication Administration: Whole meds with liquid Supervision: Patient able to self-feed Swallowing strategies  : Small bites/sips;Slow rate Postural changes: Position pt fully upright for meals;Stay upright 30-60 min after meals Oral care recommendations: Oral care BID (2x/day)  Jacqualine Mater, MA, CCC-SLP Speech Therapy

## 2023-06-06 NOTE — Assessment & Plan Note (Signed)
-   Appreciate assistance from pulmonology - Procalcitonin negative - Also started on prednisone

## 2023-06-06 NOTE — Assessment & Plan Note (Signed)
-   CT indicating subtle nodularity of liver concerning for cirrhosis -Can discuss further outpatient with primary care at follow-up

## 2023-06-06 NOTE — Assessment & Plan Note (Signed)
 Continue amlodipine

## 2023-06-06 NOTE — Plan of Care (Signed)

## 2023-06-06 NOTE — Progress Notes (Signed)
 Progress Note    Selena [name]   ZOX:[id number]  DOB: [birth year]  DOA: 06/05/2023     0 PCP: McDiarmid, Demetra Filter, MD  Initial CC: cough, chest pain  Hospital Course: Selena [name] is a 88 y.o. female with medical history significant of HTN, HLD, Afib, HFpEF, remote R breast cancer s/p mastectomy/reconstruction, and GERD p/w acute chest pain and found to have multifocal PNA.   Pt states that she was in her USOH until Sunday when she began to experience L sided chest and back pain. Pt didn't know what to make of the pain, but noted she felt unwell and went to bed at 8p which is not normal for her. Of note, she does endorse a cough for the past week with thickened sputum, but no blood or fevers. She denies any sick contacts.   CT demonstrated multifocal PNA and underlying known ILD.  Started on antibiotics and admitted for further workup and monitoring.  Interval History:  No events overnight.  Breathing comfortable when seen this morning.  Endorses she started feeling bad over the past several days prompting to present.  Assessment and Plan: * Multifocal pneumonia - CT angio chest showing new patchy nodular groundglass opacities concerning for multifocal pneumonia; questionable new nodules as well? -WBC mildly elevated on admission - Was started on Rocephin  and azithromycin  -Consulting pulmonology for further assistance today given underlying ILD and lost to follow-up after 2023  Interstitial lung disease ([name]) - Appreciate assistance from pulmonology - Procalcitonin negative - Also started on prednisone   Atrial fibrillation, permanent ([name]) - Continue Eliquis  - Rate controlled without need for agent  OSA on CPAP - Continue nightly CPAP  Cirrhosis ([name]) - CT indicating subtle nodularity of liver concerning for cirrhosis -Can discuss further outpatient with primary care at follow-up  HLD (hyperlipidemia) - Continue Crestor   Essential hypertension - Continue  amlodipine    Old records reviewed in assessment of this patient  Antimicrobials: Rocephin  06/05/2023 >> current Azithromycin  06/05/2023 >> current  DVT prophylaxis:   apixaban  (ELIQUIS ) tablet 5 mg   Code Status:   Code Status: Full Code  Mobility Assessment (Last 72 Hours)     Mobility Assessment     Row Name 06/05/23 2029 06/05/23 0900 06/05/23 0752       Does patient have an order for bedrest or is patient medically unstable No - Continue assessment No - Continue assessment No - Continue assessment     What is the highest level of mobility based on the progressive mobility assessment? Level 5 (Walks with assist in room/hall) - Balance while stepping forward/back and can walk in room with assist - Complete Level 5 (Walks with assist in room/hall) - Balance while stepping forward/back and can walk in room with assist - Complete --              Barriers to discharge: None Disposition Plan: Home HH orders placed: N/A/a Status is: Observation  Objective: Blood pressure 113/74, pulse 80, temperature 98.6 F (37 C), temperature source Oral, resp. rate 19, height 5\' 5"  (1.651 m), weight 79.8 kg, SpO2 96%.  Examination:  Physical Exam Constitutional:      Appearance: Normal appearance.  HENT:     Head: Normocephalic and atraumatic.     Mouth/Throat:     Mouth: Mucous membranes are moist.  Eyes:     Extraocular Movements: Extraocular movements intact.  Cardiovascular:     Rate and Rhythm: Normal rate and regular rhythm.  Pulmonary:  Effort: Pulmonary effort is normal. No respiratory distress.     Breath sounds: Rhonchi present. No wheezing.     Comments: Scattered crackles appreciated Abdominal:     General: Bowel sounds are normal. There is no distension.     Palpations: Abdomen is soft.     Tenderness: There is no abdominal tenderness.  Musculoskeletal:        General: Normal range of motion.     Cervical back: Normal range of motion and neck supple.  Skin:     General: Skin is warm and dry.  Neurological:     General: No focal deficit present.     Mental Status: She is alert.  Psychiatric:        Mood and Affect: Mood normal.      Consultants:  Pulmonology  Procedures:    Data Reviewed: Results for orders placed or performed during the hospital encounter of 06/05/23 (from the past 24 hours)  Sedimentation rate     Status: Abnormal   Collection Time: 06/06/23  9:42 AM  Result Value Ref Range   Sed Rate 24 (H) 0 - 22 mm/hr  Procalcitonin     Status: None   Collection Time: 06/06/23  9:42 AM  Result Value Ref Range   Procalcitonin <0.10 ng/mL   *Note: Due to a large number of results and/or encounters for the requested time period, some results have not been displayed. A complete set of results can be found in Results Review.    I have reviewed pertinent nursing notes, vitals, labs, and images as necessary. I have ordered labwork to follow up on as indicated.  I have reviewed the last notes from staff over past 24 hours. I have discussed patient's care plan and test results with nursing staff, CM/SW, and other staff as appropriate.  Time spent: Greater than 50% of the 55 minute visit was spent in counseling/coordination of care for the patient as laid out in the A&P.   LOS: 0 days   Faith Homes, MD [facility] 06/06/2023, 2:20 PM

## 2023-06-06 NOTE — Consult Note (Signed)
 NAME:  Selena [name], MRN:  [id number], DOB:  Jan 29, 1935, LOS: 0 ADMISSION DATE:  06/05/2023, CONSULTATION DATE: 06/06/2023 [facility] REFERRING MD: [facility], CHIEF COMPLAINT: General malaise, productive cough, weakness and fever.  History of Present Illness:  88 year old female carries a diagnosis of ILD with a history of COVID.  She presents with fever, general malaise, weakness productive cough of white sputum and was admitted to the hospital and treated with antimicrobial therapy which she did not improve.  She has been seen by pulmonary in the past.  We have added steroids cough suppressant and she will need to follow-up with outpatient pulmonary  Pertinent  Medical History   Past Medical History:  Diagnosis Date   Acute gastric ulcer with hemorrhage    Acute on chronic diastolic congestive heart failure ([name]) 10/16/2018   AKI (acute kidney injury) ([name])    Anemia    PMH   Aneurysm (arteriovenous) of coronary vessels    Ankle edema, bilateral 01/08/2021   Atrial fibrillation ([name]) 01/01/2007   Atrial fibrillation, permanent ([name]) 01/01/2007   Qualifier: Diagnosis of   By: Minnette Amato  MD, Ronie Cohen        BACK PAIN, UPPER 07/09/2008   Breast cancer of upper-outer quadrant of right female breast ([name]) 03/21/2011   Breast cancer, stage 1 ([name])    Bruises easily    Cataract    history   Complication of anesthesia    COVID-19 virus infection 03/08/2020   Diverticulitis    DIVERTICULOSIS, COLON 12/26/2006   Essential hypertension 10/27/2017   Follicular 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 ([name]) 07/31/2014   Follicular non-Hodgkin's lymphoma ([name])    Frozen shoulder, Left 01/08/2021   Full dentures 01/08/2021   Goiter    multi-nodular   Gustatory rhinitis 01/07/2021   Hemorrhoids 04/08/2021   History of atrioventricular nodal ablation 10/16/2018   History of cardiac radiofrequency ablation 01/25/2007   Hx of colonoscopy 2009   Incontinence of feces 04/08/2021    Influenza 12/18/2020   Insomnia 09/10/2014   Interstitial lung disease ([name])    LIVER FUNCTION TESTS, ABNORMAL, HX OF 12/26/2007   Macular degeneration, bilateral 01/08/2021   Memory loss 05/16/2007   Mild cognitive impairment 09/23/2021   Qualifier: Diagnosis of  By: Minnette Amato  MD, Ronie Cohen    Multiple gastric ulcers 2021   CC Hematemesis; EGD showed mild abnormalities in the esophagus and multiple gastric ulcers   Nocturnal hypoxemia 05/14/2020   OSTEOARTHRITIS 12/26/2006   takes Diclofenac  daily but has stopped for surgery   Osteoarthritis 12/26/2006   Qualifier: Diagnosis of  By: Rachell Budge, RN, Arlena Belts    Pain in right knee 11/28/2022   Paraspinal mass 07/16/2014   Pneumonia 11/11/2017   Pneumonia 01/31/2018   Pneumonia 01/16/2023   Pneumonia due to COVID-19 virus 03/08/2020   PONV (postoperative nausea and vomiting)    Restrictive lung disease 04/05/2021   03/22/2021  FVC 2.37 [82%], FEV1 2.07 [91%], F/F 87, TLC 3.56 [64%], DLCO 17.92 [87%] Mild restriction     pfts 10/10/17 s sign airflow obst though the insp loop is somewhat flattened. Decreased DLCO.     S/P ablation of atrial flutter 07/17/2018   Sensorineural hearing loss (SNHL) of both ears 01/07/2021   Sepsis ([name]) 04/02/2020   Shortness of breath 05/28/2019   Skin cancer    Syncope 08/21/2018   Thoracic aortic aneurysm ([name]) 10/16/2018   Tibialis posterior tendinopathy 04/08/2019   Unspecified disorder of synovium and tendon, right lower leg 04/08/2019  Upper airway cough syndrome 10/27/2017   pfts 10/10/17 s sign airflow obst though the insp loop is somewhat flattened  - Allergy  profile 10/26/2017 >  Eos 0.3 /  IgE  6 RAST neg - rx for gerd 10/26/17 > improved 12/07/2017    Urinary frequency 03/22/2022   Vasomotor rhinitis 01/08/2021   VERTIGO 09/07/2009   Has had Vestibular Rehab consultation   Visual impairment 01/07/2021   Wears dentures    Wears glasses    Wears hearing aid    bilateral     Significant  Hospital Events: Including procedures, antibiotic start and stop dates in addition to other pertinent events     Interim History / Subjective:  Reports feeling better with antimicrobial therapy  Objective    Blood pressure (!) 142/76, pulse 90, temperature 98.2 F (36.8 C), temperature source Oral, resp. rate 19, height 5\' 5"  (1.651 m), weight 79.8 kg, SpO2 94%.        Intake/Output Summary (Last 24 hours) at 06/06/2023 0955 Last data filed at 06/06/2023 1324 Gross per 24 hour  Intake 100 ml  Output --  Net 100 ml   Filed Weights   06/05/23 0231  Weight: 79.8 kg    Examination: General: Well-nourished well-developed white female on room air in no acute distress HENT: No JVD lymphadenopathy is appreciated Lungs: Diminished breath sounds in the bases with faint expiratory wheeze Cardiovascular: Heart sounds are regular Abdomen: Soft nontender Extremities: Marginal edema Neuro: Grossly intact without focal defect GU: Voids  Resolved problem list   Assessment and Plan  88 year old female with a known bilateral airspace disease with history of COVID who presents with fever productive cough with white sputum and chest pain.  She does have a history of postnasal drip with sinus drainage. She denies any history of reflux or reflux symptoms. She is on dual coverage antimicrobial therapy She is on bronchodilators We will add steroid taper 50 mg for 5 days 20 mg for 5 days Robitussin DM She probably needs to remain in the hospital for least 1 more day She will need to follow-up in the office as well as a pulmonary physician she requests a different physician than Dr. Phyllis Breeze Continue pulmonary toilet Outpatient follow-up She is on PPI twice daily  Other issues per primary  Best Practice (right click and "Reselect all SmartList Selections" daily)   Diet/type: Regular consistency (see orders) DVT prophylaxis DOAC Pressure ulcer(s): N/A GI prophylaxis: PPI Lines:  N/A Foley:  N/A Code Status:  full code Last date of multidisciplinary goals of care discussion [tbd]  Labs   CBC: Recent Labs  Lab 06/05/23 0249  WBC 11.6*  NEUTROABS 8.1*  HGB 13.9  HCT 41.8  MCV 95.0  PLT 214    Basic Metabolic Panel: Recent Labs  Lab 06/05/23 0249  NA 138  K 4.0  CL 106  CO2 20*  GLUCOSE 112*  BUN 14  CREATININE 0.61  CALCIUM  8.9   GFR: Estimated Creatinine Clearance: 53.6 mL/min (by C-G formula based on SCr of 0.61 mg/dL). Recent Labs  Lab 06/05/23 0249  WBC 11.6*    Liver Function Tests: Recent Labs  Lab 06/05/23 0249  AST 18  ALT 14  ALKPHOS 92  BILITOT 0.9  PROT 5.4*  ALBUMIN 3.2*   Recent Labs  Lab 06/05/23 0249  LIPASE 29   No results for input(s): "AMMONIA" in the last 168 hours.  ABG    Component Value Date/Time   TCO2 27 03/26/2020 1306  Coagulation Profile: Recent Labs  Lab 06/05/23 1120  INR 1.5*    Cardiac Enzymes: No results for input(s): "CKTOTAL", "CKMB", "CKMBINDEX", "TROPONINI" in the last 168 hours.  HbA1C: No results found for: "HGBA1C"  CBG: No results for input(s): "GLUCAP" in the last 168 hours.  Review of Systems:   10 point review of system taken, please see HPI for positives and negatives.   Past Medical History:  She,  has a past medical history of Acute gastric ulcer with hemorrhage, Acute on chronic diastolic congestive heart failure ([name]) (10/16/2018), AKI (acute kidney injury) ([name]), Anemia, Aneurysm (arteriovenous) of coronary vessels, Ankle edema, bilateral (01/08/2021), Atrial fibrillation ([name]) (01/01/2007), Atrial fibrillation, permanent ([name]) (01/01/2007), BACK PAIN, UPPER (07/09/2008), Breast cancer of upper-outer quadrant of right female breast ([name]) (03/21/2011), Breast cancer, stage 1 ([name]), Bruises easily, Cataract, Complication of anesthesia, COVID-19 virus infection (03/08/2020), Diverticulitis, DIVERTICULOSIS, COLON (12/26/2006), Essential hypertension (10/27/2017),  Follicular 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 ([name]) (07/31/2014), Follicular non-Hodgkin's lymphoma ([name]), Frozen shoulder, Left (01/08/2021), Full dentures (01/08/2021), Goiter, Gustatory rhinitis (01/07/2021), Hemorrhoids (04/08/2021), History of atrioventricular nodal ablation (10/16/2018), History of cardiac radiofrequency ablation (01/25/2007), colonoscopy (2009), Incontinence of feces (04/08/2021), Influenza (12/18/2020), Insomnia (09/10/2014), Interstitial lung disease ([name]), LIVER FUNCTION TESTS, ABNORMAL, HX OF (12/26/2007), Macular degeneration, bilateral (01/08/2021), Memory loss (05/16/2007), Mild cognitive impairment (09/23/2021), Multiple gastric ulcers (2021), Nocturnal hypoxemia (05/14/2020), OSTEOARTHRITIS (12/26/2006), Osteoarthritis (12/26/2006), Pain in right knee (11/28/2022), Paraspinal mass (07/16/2014), Pneumonia (11/11/2017), Pneumonia (01/31/2018), Pneumonia (01/16/2023), Pneumonia due to COVID-19 virus (03/08/2020), PONV (postoperative nausea and vomiting), Restrictive lung disease (04/05/2021), S/P ablation of atrial flutter (07/17/2018), Sensorineural hearing loss (SNHL) of both ears (01/07/2021), Sepsis ([name]) (04/02/2020), Shortness of breath (05/28/2019), Skin cancer, Syncope (08/21/2018), Thoracic aortic aneurysm ([name]) (10/16/2018), Tibialis posterior tendinopathy (04/08/2019), Unspecified disorder of synovium and tendon, right lower leg (04/08/2019), Upper airway cough syndrome (10/27/2017), Urinary frequency (03/22/2022), Vasomotor rhinitis (01/08/2021), VERTIGO (09/07/2009), Visual impairment (01/07/2021), Wears dentures, Wears glasses, and Wears hearing aid.   Surgical History:   Past Surgical History:  Procedure Laterality Date   ABDOMINAL HYSTERECTOMY     ABLATION OF DYSRHYTHMIC FOCUS  2009   APPENDECTOMY     AUGMENTATION MAMMAPLASTY Right 2013   Subpectoral implant   BIOPSY  05/07/2019   Procedure: BIOPSY;  Surgeon: Normie Becton., MD;   Location: [facility];  Service: Gastroenterology;;   BREAST BIOPSY  2013   BREAST RECONSTRUCTION  04/14/2011   Procedure: BREAST RECONSTRUCTION;  Surgeon: Elidia Grout, MD;  Location: [facility] OR;  Service: Plastics;  Laterality: Right;  Placement of Right Breast Tissue Expander with  use of Flex HD for Breast Reconstruction   BREAST SURGERY     right sm, snbx   CATARACT EXTRACTION W/ INTRAOCULAR LENS  IMPLANT, BILATERAL  2010   bilateral   COLONOSCOPY     COLONOSCOPY     ESOPHAGOGASTRODUODENOSCOPY (EGD) WITH PROPOFOL  N/A 05/07/2019   Procedure: ESOPHAGOGASTRODUODENOSCOPY (EGD) WITH PROPOFOL ;  Surgeon: Normie Becton., MD;  Location: [facility];  Service: Gastroenterology;  Laterality: N/A;   KNEE SURGERY  2004/2010   arthroscopic/ bilateral knee replacements   LOOP RECORDER IMPLANT     MASTECTOMY Right 2013   PORT-A-CATH REMOVAL N/A 12/07/2016   Procedure: REMOVAL PORT-A-CATH;  Surgeon: Enid Harry, MD;  Location: [facility] OR;  Service: General;  Laterality: N/A;   PORTACATH PLACEMENT Right 08/11/2014   Procedure: INSERTION PORT-A-CATH WITH ULTRASOUND;  Surgeon: Enid Harry, MD;  Location: [facility] OR;  Service: General;  Laterality: Right;   ROTATOR CUFF REPAIR  1999   right   tmi  1998   TONSILLECTOMY     TOTAL KNEE ARTHROPLASTY Bilateral      Social History:   reports that she quit smoking about 45 years ago. Her smoking use included cigarettes. She started smoking about 65 years ago. She has a 2 pack-year smoking history. She has been exposed to tobacco smoke. She has never used smokeless tobacco. She reports that she does not drink alcohol and does not use drugs.   Family History:  Her family history includes Cancer in her brother, maternal uncle, mother, and sister; Colon cancer in her maternal aunt and mother; Other in her maternal uncle; Prostate cancer in her maternal uncle and maternal uncle. There is no history of Anesthesia problems, Hypotension, Malignant  hyperthermia, Pseudochol deficiency, or Breast cancer.   Allergies Allergies  Allergen Reactions   Codeine  Other (See Comments)    Hallucinations.  Pt states she can take this now that she can no longer see due to Macular degeneration  Other Reaction(s): Other  Hallucinations.  Pt states she can take this now that she can no longer see due to Macular degeneration     Hallucinations   Codeine  Phosphate Other (See Comments)    Hallucinations   Adhesive [Tape] Other (See Comments)    Electrodes for EKG- skin blistering, erythema     Home Medications  Prior to Admission medications   Medication Sig Start Date End Date Taking? Authorizing Provider  albuterol  (VENTOLIN  HFA) 108 (90 Base) MCG/ACT inhaler INHALE 2 PUFFS INTO THE LUNGS EVERY 4 HOURS AS NEEDED FOR WHEEZING OR SHORTNESS OF BREATH 05/22/23  Yes McDiarmid, Demetra Filter, MD  amLODipine  (NORVASC ) 2.5 MG tablet Take 1 tablet (2.5 mg total) by mouth at bedtime. 05/02/23  Yes McDiarmid, Demetra Filter, MD  apixaban  (ELIQUIS ) 5 MG TABS tablet TAKE 1 TABLET(5 MG) BY MOUTH TWICE DAILY 01/31/23  Yes Swinyer, Leilani Punter, NP  azelastine  (ASTELIN ) 0.1 % nasal spray PALCE 2 SPRAYS INTO BOTH NOSTRILS ONCE DAILY FOR 1 DOSE. 10/19/22  Yes McDiarmid, Demetra Filter, MD  famotidine  (PEPCID ) 20 MG tablet TAKE 1 TABLET(20 MG) BY MOUTH AT BEDTIME 03/04/23  Yes Kennedy-Smith, Colleen M, NP  guaiFENesin  (MUCINEX ) 600 MG 12 hr tablet Take 1 tablet (600 mg total) by mouth 2 (two) times daily as needed. 05/26/23  Yes McDiarmid, Todd D, MD  ipratropium (ATROVENT ) 0.06 % nasal spray Place 2 sprays into both nostrils 4 (four) times daily. 04/27/23  Yes McDiarmid, Demetra Filter, MD  montelukast  (SINGULAIR ) 10 MG tablet TAKE 1 TABLET(10 MG) BY MOUTH AT BEDTIME 06/02/23  Yes Candee Cha, MD  pantoprazole  (PROTONIX ) 40 MG tablet Take 1 tablet (40 mg total) by mouth 2 (two) times daily. 04/18/23  Yes Kennedy-Smith, Colleen M, NP  rosuvastatin  (CRESTOR ) 10 MG tablet Take 1 tablet (10 mg total) by  mouth daily. 02/22/23  Yes Swinyer, Leilani Punter, NP  doxepin  (SINEQUAN ) 10 MG/ML solution Take 1 mL (10 mg total) by mouth at bedtime as needed for sleep. Patient not taking: Reported on 06/05/2023 04/27/23   McDiarmid, Demetra Filter, MD     Critical care time: Bonnee Bustle Ynez Eugenio ACNP Acute Care Nurse Practitioner [facility] Please consult Amion 06/06/2023, 9:55 AM

## 2023-06-06 NOTE — Assessment & Plan Note (Signed)
-   CT angio chest showing new patchy nodular groundglass opacities concerning for multifocal pneumonia; questionable new nodules as well? -WBC mildly elevated on admission - Was started on Rocephin  and azithromycin  -Consulting pulmonology for further assistance today given underlying ILD and lost to follow-up after 2023

## 2023-06-07 ENCOUNTER — Telehealth: Payer: Self-pay | Admitting: Internal Medicine

## 2023-06-07 ENCOUNTER — Encounter (HOSPITAL_COMMUNITY): Payer: Self-pay | Admitting: Internal Medicine

## 2023-06-07 DIAGNOSIS — J189 Pneumonia, unspecified organism: Secondary | ICD-10-CM | POA: Diagnosis not present

## 2023-06-07 MED ORDER — BREO ELLIPTA 200-25 MCG/ACT IN AEPB: 1.0000 | INHALATION_SPRAY | Freq: Every day | RESPIRATORY_TRACT | 0 refills | Status: DC

## 2023-06-07 MED ORDER — ENSURE ENLIVE PO LIQD: 237.0000 mL | Freq: Two times a day (BID) | ORAL | 12 refills | Status: DC

## 2023-06-07 MED ORDER — AMOXICILLIN-POT CLAVULANATE 875-125 MG PO TABS: 1.0000 | ORAL_TABLET | Freq: Two times a day (BID) | ORAL | 0 refills | Status: AC

## 2023-06-07 MED ORDER — PREDNISONE 20 MG PO TABS: 40.0000 mg | ORAL_TABLET | Freq: Every day | ORAL | 0 refills | Status: AC

## 2023-06-07 NOTE — Telephone Encounter (Signed)
 ATC patient, no one answered and mailbox was full, I'm sending MyChart message.

## 2023-06-07 NOTE — Progress Notes (Signed)
 O2 sat sitting on edge of bed on room air is 99%.  Pt ambulated from room all the way down hall to fire door and back to room.  O2 sat remained above 94%.  Pt denies any shortness of breath.  Returned to room.  Tolerated activity well.

## 2023-06-07 NOTE — Telephone Encounter (Signed)
 Ok with me for her to see another provider

## 2023-06-07 NOTE — Progress Notes (Signed)
 06/07/2023     I have seen and evaluated the patient for cough, SOB.   S:  Feels/looks better. MBSS noted.   O: Blood pressure 96/66, pulse 64, temperature 97.6 F (36.4 C), temperature source Oral, resp. rate 17, height 5\' 3"  (1.6 m), weight 81.6 kg, SpO2 94 %.    No distress Less crackles Ambulating with walker Pct neg ESR mildly up   A:  What sounds like mild AE-ILD, pleurisy improved NSIP-type baseline ILD, some superimposed changes that will need OP f/u  Mild dysphagia with recs as given by SLP Minimal liver nodular changes on CT without lab correlate: told patient not to worry about  P:  - Okay for home with 5-7 day prednisone  - Will arrange f/u in clinic in 2-3 weeks to establish repeat PFTs, CT f/u of nodular changes in lungs - Office number in DC paperwork if she needs to call sooner - Stable for DC from my end please reach out if any questions or concerns   Ardelle Kos MD [facility] Prefer epic messenger for cross cover needs If after hours, please call E-link

## 2023-06-07 NOTE — Discharge Summary (Signed)
 Physician Discharge Summary   Patient: Selena [name] MRN: [id number] DOB: [birth year]  Admit date:     06/05/2023  Discharge date: 06/07/23  Discharge Physician: Junita Oliva   PCP: McDiarmid, Demetra Filter, MD   Recommendations at discharge:    Discharge to home Follow up with PCP in 1-2 weeks. Follow up with pulmonology as outpatient. They will call you to set up an appointment.  Discharge Diagnoses: Principal Problem:   Multifocal pneumonia Active Problems:   Interstitial lung disease ([name])   Atrial fibrillation, permanent ([name])   OSA on CPAP   Essential hypertension   OAB (overactive bladder)   HLD (hyperlipidemia)   Cirrhosis ([name])  Resolved Problems:   * No resolved hospital problems. *  Hospital Course: Selena [name] is a 88 y.o. female with medical history significant of HTN, HLD, Afib, HFpEF, remote R breast cancer s/p mastectomy/reconstruction, and GERD p/w acute chest pain and found to have multifocal PNA.   Pt states that she was in her USOH until "Sunday when she began to experience L sided chest and back pain. Pt didn't know what to make of the pain, but noted she felt unwell and went to bed at 8p which is not normal for her. Of note, she does endorse a cough for the past week with thickened sputum, but no blood or fevers. She denies any sick contacts.   CT demonstrated multifocal PNA and underlying known ILD.  Started on antibiotics and admitted for further workup and monitoring. Pulmonology was consulted and the patient was started on Breo Ellipta.  On the morning of 06/07/2023 the patient was sitting up in bed and stating that she felt well. She has been cleared for discharge by pulmonology. She will be discharged to home in fair condition today on Augmentin and prednisone. Assessment and Plan: * Multifocal pneumonia - CT angio chest showing new patchy nodular groundglass opacities concerning for multifocal pneumonia; questionable new nodules as well? -WBC  mildly elevated on admission - Was started on Rocephin and azithromycin -Consulting pulmonology for further assistance today given underlying ILD and lost to follow-up after 2023  Interstitial lung disease ([name]) - Appreciate assistance from pulmonology - Procalcitonin negative - Also started on prednisone  Atrial fibrillation, permanent ([name]) - Continue Eliquis - Rate controlled without need for agent  OSA on CPAP - Continue nightly CPAP  Cirrhosis ([name]) - CT indicating subtle nodularity of liver concerning for cirrhosis -Can discuss further outpatient with primary care at follow-up  HLD (hyperlipidemia) - Continue Crestor  Essential hypertension - Continue amlodipine         Consultants: pulmonology Procedures performed: None  Disposition: Home Diet recommendation:  Discharge Diet Orders (From admission, onward)     Start     Ordered   06/07/23 0000  Diet - low sodium heart healthy        05" /14/25 1338           Regular diet DISCHARGE MEDICATION: Allergies as of 06/07/2023       Reactions   Codeine  Other (See Comments)   Hallucinations.  Pt states she can take this now that she can no longer see due to Macular degeneration Other Reaction(s): Other Hallucinations.  Pt states she can take this now that she can no longer see due to Macular degeneration     Hallucinations   Codeine  Phosphate Other (See Comments)   Hallucinations   Adhesive [tape] Other (See Comments)   Electrodes for EKG- skin blistering, erythema  Medication List     STOP taking these medications    doxepin  10 MG/ML solution Commonly known as: SINEQUAN    Mucinex  600 MG 12 hr tablet Generic drug: guaiFENesin        TAKE these medications    albuterol  108 (90 Base) MCG/ACT inhaler Commonly known as: VENTOLIN  HFA INHALE 2 PUFFS INTO THE LUNGS EVERY 4 HOURS AS NEEDED FOR WHEEZING OR SHORTNESS OF BREATH   amLODipine  2.5 MG tablet Commonly known as: NORVASC  Take 1  tablet (2.5 mg total) by mouth at bedtime.   amoxicillin -clavulanate 875-125 MG tablet Commonly known as: AUGMENTIN  Take 1 tablet by mouth 2 (two) times daily for 5 days.   apixaban  5 MG Tabs tablet Commonly known as: Eliquis  TAKE 1 TABLET(5 MG) BY MOUTH TWICE DAILY   azelastine  0.1 % nasal spray Commonly known as: ASTELIN  PALCE 2 SPRAYS INTO BOTH NOSTRILS ONCE DAILY FOR 1 DOSE.   Breo Ellipta  200-25 MCG/ACT Aepb Generic drug: fluticasone  furoate-vilanterol Inhale 1 puff into the lungs daily.   famotidine  20 MG tablet Commonly known as: PEPCID  TAKE 1 TABLET(20 MG) BY MOUTH AT BEDTIME   feeding supplement Liqd Take 237 mLs by mouth 2 (two) times daily between meals.   ipratropium 0.06 % nasal spray Commonly known as: ATROVENT  Place 2 sprays into both nostrils 4 (four) times daily.   montelukast  10 MG tablet Commonly known as: SINGULAIR  TAKE 1 TABLET(10 MG) BY MOUTH AT BEDTIME   pantoprazole  40 MG tablet Commonly known as: PROTONIX  Take 1 tablet (40 mg total) by mouth 2 (two) times daily.   predniSONE  20 MG tablet Commonly known as: DELTASONE  Take 2 tablets (40 mg total) by mouth daily with breakfast for 7 days. Start taking on: Jun 08, 2023   rosuvastatin  10 MG tablet Commonly known as: CRESTOR  Take 1 tablet (10 mg total) by mouth daily.        Follow-up Information     Maire Scot, MD Follow up.   Specialty: Pulmonary Disease Why: 2-3 weeks we will set up appointment, call if you don't hear from us  by next week Contact information: [address] [address] [place] [address] [phone]                Discharge Exam: Cleavon Curls Weights   06/05/23 0231  Weight: 79.8 kg   Exam:  Constitutional:  The patient is awake, alert, and oriented x 3. No acute distress. Respiratory:  No increased work of breathing. No wheezes, rales, or rhonchi No tactile fremitus Cardiovascular:  Regular rate and rhythm No murmurs, ectopy, or gallups. No  lateral PMI. No thrills. Abdomen:  Abdomen is soft, non-tender, non-distended No hernias, masses, or organomegaly Normoactive bowel sounds.  Musculoskeletal:  No cyanosis, clubbing, or edema Skin:  No rashes, lesions, ulcers palpation of skin: no induration or nodules Neurologic:  CN 2-12 intact Sensation all 4 extremities intact Psychiatric:  Mental status Mood, affect appropriate Orientation to person, place, time  judgment and insight appear intact   Condition at discharge: fair  The results of significant diagnostics from this hospitalization (including imaging, microbiology, ancillary and laboratory) are listed below for reference.   Imaging Studies: DG Swallowing Func-Speech Pathology Result Date: 06/06/2023 Table formatting from the original result was not included. Modified Barium Swallow Study Patient Details Name: Daytona Stanforth MRN: [id number] Date of Birth: 12/17/1935 Today's Date: 06/06/2023 HPI/PMH: HPI: Patient is an 88 y.o. female with PMH: HTN, HLD, a-fib, GERD, remote breast cancer s/p mastectomy/reconstruction. She presented to the  hospital on 06/05/23 with acute chest pain and found to have multifocal PNA. MBS with SLP ordered to r/o chronic silent aspiration. Clinical Impression: Clinical Impression: Patient presents with an oropharyngeal swallow that is [facility] as per this MBS. No incidents of aspiration occured during any phase of the swallow with any of the tested liquid or solid consistencies of barium. Anterior hyoid excursion and laryngeal elevation were both partial in completion but patient with complete epiglottic inversion and laryngeal vestibule closure. Swallow was initiated at level of pyriform sinuses with thin liquids and initiated at level of vallecular sinus with puree and mechanical soft solids and honey thick liquids. Flash penetration (PAS 2) that remained above the vocal cords and fully exited laryngeal vestibule occured with thin liquids. Barium  residuals were generally observed on lingual surface and cleared with subsequent swallows. No significant amount of pharyngeal residuals observed with any of the tested consistencies. 13mm barium tablet taken with thin liquid barium transited through PES and esophagus without any observed difficulty. No retrograde movement of barium observed at or below PES and no barium stasis observed during esophageal sweep. SLP is recommending to continue with current PO diet and no further skilled intervention warranted at this time. Factors that may increase risk of adverse event in presence of aspiration Roderick Civatte & Jessy Morocco 2021): No data recorded Recommendations/Plan: Swallowing Evaluation Recommendations Swallowing Evaluation Recommendations Recommendations: PO diet PO Diet Recommendation: Regular; Thin liquids (Level 0) Liquid Administration via: Cup; Straw Medication Administration: Whole meds with liquid Supervision: Patient able to self-feed Swallowing strategies  : Small bites/sips; Slow rate Postural changes: Position pt fully upright for meals; Stay upright 30-60 min after meals Oral care recommendations: Oral care BID (2x/day) Treatment Plan Treatment Plan Treatment recommendations: No treatment recommended at this time Follow-up recommendations: No SLP follow up Functional status assessment: Patient has not had a recent decline in their functional status. Recommendations Recommendations for follow up therapy are one component of a multi-disciplinary discharge planning process, led by the attending physician.  Recommendations may be updated based on patient status, additional functional criteria and insurance authorization. Assessment: Orofacial Exam: Orofacial Exam Oral Cavity: Oral Hygiene: WFL Oral Cavity - Dentition: Adequate natural dentition Orofacial Anatomy: WFL Oral Motor/Sensory Function: WFL Anatomy: Anatomy: Prominent cricopharyngeus Boluses Administered: Boluses Administered Boluses Administered: Thin  liquids (Level 0); Mildly thick liquids (Level 2, nectar thick); Moderately thick liquids (Level 3, honey thick); Puree; Solid  Oral Impairment Domain: Oral Impairment Domain Lip Closure: No labial escape Tongue control during bolus hold: Cohesive bolus between tongue to palatal seal Bolus preparation/mastication: Timely and efficient chewing and mashing Bolus transport/lingual motion: Brisk tongue motion Oral residue: Residue collection on oral structures Location of oral residue : Tongue Initiation of pharyngeal swallow : Pyriform sinuses; Valleculae  Pharyngeal Impairment Domain: Pharyngeal Impairment Domain Soft palate elevation: No bolus between soft palate (SP)/pharyngeal wall (PW) Laryngeal elevation: Partial superior movement of thyroid  cartilage/partial approximation of arytenoids to epiglottic petiole Anterior hyoid excursion: Partial anterior movement Epiglottic movement: Complete inversion Laryngeal vestibule closure: Complete, no air/contrast in laryngeal vestibule Pharyngeal stripping wave : Present - complete Pharyngeal contraction (A/P view only): N/A Pharyngoesophageal segment opening: Complete distension and complete duration, no obstruction of flow Tongue base retraction: No contrast between tongue base and posterior pharyngeal wall (PPW) Pharyngeal residue: Trace residue within or on pharyngeal structures Location of pharyngeal residue: Tongue base; Valleculae  Esophageal Impairment Domain: Esophageal Impairment Domain Esophageal clearance upright position: Complete clearance, esophageal coating Pill: Pill Consistency administered: Thin liquids (Level 0)  Thin liquids (Level 0): [facility] Penetration/Aspiration Scale Score: Penetration/Aspiration Scale Score 1.  Material does not enter airway: Mildly thick liquids (Level 2, nectar thick); Moderately thick liquids (Level 3, honey thick); Puree; Solid; Pill 2.  Material enters airway, remains ABOVE vocal cords then ejected out: Thin liquids (Level 0)  Compensatory Strategies: Compensatory Strategies Compensatory strategies: No   General Information: No data recorded Diet Prior to this Study: Regular; Thin liquids (Level 0)   Temperature : Normal   Respiratory Status: WFL   Supplemental O2: None (Room air)   History of Recent Intubation: No  Behavior/Cognition: Alert; Cooperative; Pleasant mood Self-Feeding Abilities: Able to self-feed Baseline vocal quality/speech: Normal Volitional Cough: Able to elicit Volitional Swallow: Able to elicit Exam Limitations: No limitations Goal Planning: No data recorded No data recorded No data recorded No data recorded Consulted and agree with results and recommendations: Patient Pain: Pain Assessment Pain Assessment: No/denies pain End of Session: Start Time:SLP Start Time (ACUTE ONLY): 1505 Stop Time: SLP Stop Time (ACUTE ONLY): 1520 Time Calculation:SLP Time Calculation (min) (ACUTE ONLY): 15 min Charges: SLP Evaluations $ SLP Speech Visit: 1 Visit SLP Evaluations $MBS Swallow: 1 Procedure SLP visit diagnosis: SLP Visit Diagnosis: Dysphagia, oropharyngeal phase (R13.12) Past Medical History: Past Medical History: Diagnosis Date  Acute gastric ulcer with hemorrhage   Acute on chronic diastolic congestive heart failure ([name]) 10/16/2018  AKI (acute kidney injury) ([name])   Anemia   PMH  Aneurysm (arteriovenous) of coronary vessels   Ankle edema, bilateral 01/08/2021  Atrial fibrillation ([name]) 01/01/2007  Atrial fibrillation, permanent ([name]) 01/01/2007  Qualifier: Diagnosis of   By: Minnette Amato  MD, Ronie Cohen       BACK PAIN, UPPER 07/09/2008  Breast cancer of upper-outer quadrant of right female breast ([name]) 03/21/2011  Breast cancer, stage 1 ([name])   Bruises easily   Cataract   history  Complication of anesthesia   COVID-19 virus infection 03/08/2020  Diverticulitis   DIVERTICULOSIS, COLON 12/26/2006  Essential hypertension 10/27/2017  Follicular 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 ([name]) 07/31/2014  Follicular  non-Hodgkin's lymphoma ([name])   Frozen shoulder, Left 01/08/2021  Full dentures 01/08/2021  Goiter   multi-nodular  Gustatory rhinitis 01/07/2021  Hemorrhoids 04/08/2021  History of atrioventricular nodal ablation 10/16/2018  History of cardiac radiofrequency ablation 01/25/2007  Hx of colonoscopy 2009  Incontinence of feces 04/08/2021  Influenza 12/18/2020  Insomnia 09/10/2014  Interstitial lung disease ([name])   LIVER FUNCTION TESTS, ABNORMAL, HX OF 12/26/2007  Macular degeneration, bilateral 01/08/2021  Memory loss 05/16/2007  Mild cognitive impairment 09/23/2021  Qualifier: Diagnosis of  By: Minnette Amato  MD, Ronie Cohen   Multiple gastric ulcers 2021  CC Hematemesis; EGD showed mild abnormalities in the esophagus and multiple gastric ulcers  Nocturnal hypoxemia 05/14/2020  OSTEOARTHRITIS 12/26/2006  takes Diclofenac  daily but has stopped for surgery  Osteoarthritis 12/26/2006  Qualifier: Diagnosis of  By: Rachell Budge, RN, Arlena Belts   Pain in right knee 11/28/2022  Paraspinal mass 07/16/2014  Pneumonia 11/11/2017  Pneumonia 01/31/2018  Pneumonia 01/16/2023  Pneumonia due to COVID-19 virus 03/08/2020  PONV (postoperative nausea and vomiting)   Restrictive lung disease 04/05/2021  03/22/2021  FVC 2.37 [82%], FEV1 2.07 [91%], F/F 87, TLC 3.56 [64%], DLCO 17.92 [87%] Mild restriction     pfts 10/10/17 s sign airflow obst though the insp loop is somewhat flattened. Decreased DLCO.    S/P ablation of atrial flutter 07/17/2018  Sensorineural hearing loss (SNHL) of both ears 01/07/2021  Sepsis ([name]) 04/02/2020  Shortness of  breath 05/28/2019  Skin cancer   Syncope 08/21/2018  Thoracic aortic aneurysm ([name]) 10/16/2018  Tibialis posterior tendinopathy 04/08/2019  Unspecified disorder of synovium and tendon, right lower leg 04/08/2019  Upper airway cough syndrome 10/27/2017  pfts 10/10/17 s sign airflow obst though the insp loop is somewhat flattened  - Allergy  profile 10/26/2017 >  Eos 0.3 /  IgE  6 RAST neg - rx for gerd 10/26/17 > improved  12/07/2017   Urinary frequency 03/22/2022  Vasomotor rhinitis 01/08/2021  VERTIGO 09/07/2009  Has had Vestibular Rehab consultation  Visual impairment 01/07/2021  Wears dentures   Wears glasses   Wears hearing aid   bilateral Past Surgical History: Past Surgical History: Procedure Laterality Date  ABDOMINAL HYSTERECTOMY    ABLATION OF DYSRHYTHMIC FOCUS  2009  APPENDECTOMY    AUGMENTATION MAMMAPLASTY Right 2013  Subpectoral implant  BIOPSY  05/07/2019  Procedure: BIOPSY;  Surgeon: Normie Becton., MD;  Location: [facility];  Service: Gastroenterology;;  BREAST BIOPSY  2013  BREAST RECONSTRUCTION  04/14/2011  Procedure: BREAST RECONSTRUCTION;  Surgeon: Elidia Grout, MD;  Location: [facility] OR;  Service: Plastics;  Laterality: Right;  Placement of Right Breast Tissue Expander with  use of Flex HD for Breast Reconstruction  BREAST SURGERY    right sm, snbx  CATARACT EXTRACTION W/ INTRAOCULAR LENS  IMPLANT, BILATERAL  2010  bilateral  COLONOSCOPY    COLONOSCOPY    ESOPHAGOGASTRODUODENOSCOPY (EGD) WITH PROPOFOL  N/A 05/07/2019  Procedure: ESOPHAGOGASTRODUODENOSCOPY (EGD) WITH PROPOFOL ;  Surgeon: Normie Becton., MD;  Location: [facility];  Service: Gastroenterology;  Laterality: N/A;  KNEE SURGERY  2004/2010  arthroscopic/ bilateral knee replacements  LOOP RECORDER IMPLANT    MASTECTOMY Right 2013  PORT-A-CATH REMOVAL N/A 12/07/2016  Procedure: REMOVAL PORT-A-CATH;  Surgeon: Enid Harry, MD;  Location: [facility] OR;  Service: General;  Laterality: N/A;  PORTACATH PLACEMENT Right 08/11/2014  Procedure: INSERTION PORT-A-CATH WITH ULTRASOUND;  Surgeon: Enid Harry, MD;  Location: [facility] OR;  Service: General;  Laterality: Right;  ROTATOR CUFF REPAIR  1999  right  tmi  1998  TONSILLECTOMY    TOTAL KNEE ARTHROPLASTY Bilateral  Jacqualine Mater, MA, CCC-SLP Speech Therapy   CT Angio Chest/Abd/Pel for Dissection W and/or Wo Contrast Result Date: 06/05/2023 CLINICAL DATA:  Acute aortic syndrome.  Chest pain.  EXAM: CT ANGIOGRAPHY CHEST, ABDOMEN AND PELVIS TECHNIQUE: Non-contrast CT of the chest was initially obtained. Multidetector CT imaging through the chest, abdomen and pelvis was performed using the standard protocol during bolus administration of intravenous contrast. Multiplanar reconstructed images and MIPs were obtained and reviewed to evaluate the vascular anatomy. RADIATION DOSE REDUCTION: This exam was performed according to the departmental dose-optimization program which includes automated exposure control, adjustment of the mA and/or kV according to patient size and/or use of iterative reconstruction technique. CONTRAST:  75mL OMNIPAQUE  IOHEXOL  350 MG/ML SOLN COMPARISON:  Chest abdomen pelvis CT 06/27/2022 FINDINGS: CTA CHEST FINDINGS Cardiovascular: Pre contrast imaging shows no hyperdense crescent in the wall of the thoracic aorta to suggest the presence of an acute intramural hematoma. Ascending thoracic aorta measures up to 4.0 cm diameter. No dissection flap within the thoracic aorta. Arterial aortic arch branch vessel anatomy is widely patent. Mild atherosclerotic calcification is noted in the wall of the thoracic aorta. No large central pulmonary embolus in the main or lobar pulmonary arteries. No findings to suggest segmental pulmonary embolus although assessment is limited by bolus timing. Mediastinum/Nodes: No mediastinal lymphadenopathy. 2.9 cm left thyroid  nodule evident with other smaller bilateral thyroid  nodules  present. This has been evaluated on previous imaging. (ref: J Am Coll Radiol. 2015 Feb;12(2): 143-50).There is no hilar lymphadenopathy. Small hiatal hernia. The esophagus has normal imaging features. There is no axillary lymphadenopathy. Lungs/Pleura: Areas of subpleural reticulation and subpleural patchy ground-glass attenuation are noted in the lungs bilaterally, suggesting background chronic interstitial lung disease, similar to mildly progressive in the interval. There is a  cluster of small ground-glass opacities in the peripheral right mid lung (57/7) new in the interval patchy and nodular consolidative opacities seen in the inferior left upper lobe and lingula (image 80/7), new since prior. 18 mm area of nodular consolidative disease is seen in the paraspinal right lower lobe on 96/7. No pleural effusion. Musculoskeletal: No worrisome lytic or sclerotic osseous abnormality. Review of the MIP images confirms the above findings. CTA ABDOMEN AND PELVIS FINDINGS VASCULAR Aorta: Normal caliber aorta without aneurysm, dissection, vasculitis or significant stenosis. Moderate to advanced atherosclerosis. Celiac: Patent without evidence of aneurysm, dissection, vasculitis or significant stenosis. SMA: Patent without evidence of aneurysm, dissection, vasculitis or significant stenosis. Replaced right hepatic artery. Renals: Both renal arteries are patent without evidence of aneurysm, dissection, vasculitis, fibromuscular dysplasia or significant stenosis. Accessory right renal artery. IMA: Patent without evidence of aneurysm, dissection, vasculitis or significant stenosis. Inflow: Patent without evidence of aneurysm, dissection, vasculitis or significant stenosis. Veins: No obvious venous abnormality within the limitations of this arterial phase study. Review of the MIP images confirms the above findings. NON-VASCULAR Hepatobiliary: No suspicious focal abnormality within the liver parenchyma. Subtle nodularity of liver contour suggests cirrhosis. There is no evidence for gallstones, gallbladder wall thickening, or pericholecystic fluid. No intrahepatic or extrahepatic biliary dilation. Pancreas: No focal mass lesion. No dilatation of the main duct. No intraparenchymal cyst. No peripancreatic edema. Spleen: No splenomegaly. No suspicious focal mass lesion. Adrenals/Urinary Tract: No adrenal nodule or mass. Kidneys unremarkable. No evidence for hydroureter. The urinary bladder appears normal for  the degree of distention. Stomach/Bowel: Small hiatal hernia. Stomach otherwise unremarkable. Duodenum is normally positioned as is the ligament of Treitz. No small bowel wall thickening. No small bowel dilatation. The terminal ileum is normal. The appendix is not well visualized, but there is no edema or inflammation in the region of the cecal tip to suggest appendicitis. No gross colonic mass. No colonic wall thickening. Diverticular changes are noted in the left colon without evidence of diverticulitis. Lymphatic: There is no gastrohepatic or hepatoduodenal ligament lymphadenopathy. No retroperitoneal or mesenteric lymphadenopathy. No pelvic sidewall lymphadenopathy. Reproductive: Hysterectomy.  There is no adnexal mass. Other: No intraperitoneal free fluid. Musculoskeletal: Pelvic floor laxity with cystocele. No worrisome lytic or sclerotic osseous abnormality. Degenerative disc disease noted lumbar spine. Review of the MIP images confirms the above findings. IMPRESSION: 1. No evidence for thoracoabdominal aortic aneurysm. No thoracoabdominal aortic dissection or acute intramural hematoma. 2. Areas of subpleural reticulation and subpleural patchy ground-glass attenuation in the lungs bilaterally, suggesting background chronic interstitial lung disease, similar to mildly progressive in the interval. 3. New cluster of patchy/nodular ground-glass opacity in the peripheral right lung and new patchy and nodular consolidative opacities in the inferior left upper lobe and lingula. 18 mm area of nodular consolidative disease in the paraspinal right lower lobe. Imaging features are compatible with multifocal pneumonia but given nodular character in some regions warrant close follow-up. Repeat CT chest in 3 months recommended. 4. Subtle nodularity of liver contour suggests cirrhosis. 5. Small hiatal hernia. 6. Left colonic diverticulosis without diverticulitis. 7. Pelvic floor laxity with cystocele. 8.  Aortic  Atherosclerosis (ICD10-I70.0). Electronically Signed   By: Donnal Fusi M.D.   On: 06/05/2023 05:59   DG Chest 2 View Result Date: 06/05/2023 CLINICAL DATA:  Initial evaluation for acute chest pain. EXAM: CHEST - 2 VIEW COMPARISON:  Radiograph from 01/16/2023 FINDINGS: Cardiomegaly. Mediastinal silhouette within normal limits. Aortic atherosclerosis. Lungs normally inflated. Chronic coarsening of the interstitial markings with architectural distortion involving the mid and lower lungs, consistent with underlying fibrotic changes as seen on prior exams. Overlying vascular and interstitial prominence, suggesting superimposed mild pulmonary interstitial congestion/edema. Trace fluid noted along the right minor fissure. No consolidative airspace disease. No pneumothorax. Visualized osseous structures demonstrate no acute finding. IMPRESSION: 1. Cardiomegaly with mild pulmonary interstitial congestion/edema. 2. Underlying fibrotic changes involving the mid and lower lungs. 3. Aortic Atherosclerosis (ICD10-I70.0). Electronically Signed   By: Virgia Griffins M.D.   On: 06/05/2023 04:15    Microbiology: Results for orders placed or performed during the hospital encounter of 06/05/23  Resp panel by RT-PCR (RSV, Flu A&B, Covid) Anterior Nasal Swab     Status: None   Collection Time: 06/05/23  6:17 AM   Specimen: Anterior Nasal Swab  Result Value Ref Range Status   SARS Coronavirus 2 by RT PCR NEGATIVE NEGATIVE Final   Influenza A by PCR NEGATIVE NEGATIVE Final   Influenza B by PCR NEGATIVE NEGATIVE Final    Comment: (NOTE) The Xpert Xpress SARS-CoV-2/FLU/RSV plus assay is intended as an aid in the diagnosis of influenza from Nasopharyngeal swab specimens and should not be used as a sole basis for treatment. Nasal washings and aspirates are unacceptable for Xpert Xpress SARS-CoV-2/FLU/RSV testing.  Fact Sheet for Patients: BloggerCourse.com  Fact Sheet for Healthcare  Providers: SeriousBroker.it  This test is not yet approved or cleared by the [place]  FDA and has been authorized for detection and/or diagnosis of SARS-CoV-2 by FDA under an Emergency Use Authorization (EUA). This EUA will remain in effect (meaning this test can be used) for the duration of the COVID-19 declaration under Section 564(b)(1) of the Act, 21 U.S.C. section 360bbb-3(b)(1), unless the authorization is terminated or revoked.     Resp Syncytial Virus by PCR NEGATIVE NEGATIVE Final    Comment: (NOTE) Fact Sheet for Patients: BloggerCourse.com  Fact Sheet for Healthcare Providers: SeriousBroker.it  This test is not yet approved or cleared by the [place]  FDA and has been authorized for detection and/or diagnosis of SARS-CoV-2 by FDA under an Emergency Use Authorization (EUA). This EUA will remain in effect (meaning this test can be used) for the duration of the COVID-19 declaration under Section 564(b)(1) of the Act, 21 U.S.C. section 360bbb-3(b)(1), unless the authorization is terminated or revoked.  Performed at [facility], [address], [place], [place] [address]    *Note: Due to a large number of results and/or encounters for the requested time period, some results have not been displayed. A complete set of results can be found in Results Review.    Labs: CBC: Recent Labs  Lab 06/05/23 0249  WBC 11.6*  NEUTROABS 8.1*  HGB 13.9  HCT 41.8  MCV 95.0  PLT 214   Basic Metabolic Panel: Recent Labs  Lab 06/05/23 0249  NA 138  K 4.0  CL 106  CO2 20*  GLUCOSE 112*  BUN 14  CREATININE 0.61  CALCIUM  8.9   Liver Function Tests: Recent Labs  Lab 06/05/23 0249  AST 18  ALT 14  ALKPHOS 92  BILITOT 0.9  PROT 5.4*  ALBUMIN 3.2*   CBG: No results for input(s): "GLUCAP" in the last 168 hours.  Discharge time spent: greater than 30 minutes.  Signed: Shamanda Len, DO [facility] 06/07/2023

## 2023-06-07 NOTE — Plan of Care (Signed)

## 2023-06-07 NOTE — Plan of Care (Signed)
  Problem: Education: Goal: Knowledge of General Education information will improve Description: Including pain rating scale, medication(s)/side effects and non-pharmacologic comfort measures 06/07/2023 0327 by Rae Bugler, RN Outcome: Progressing 06/07/2023 0325 by Rae Bugler, RN Outcome: Progressing   Problem: Health Behavior/Discharge Planning: Goal: Ability to manage health-related needs will improve 06/07/2023 0327 by Rae Bugler, RN Outcome: Progressing 06/07/2023 0325 by Rae Bugler, RN Outcome: Progressing   Problem: Clinical Measurements: Goal: Ability to maintain clinical measurements within normal limits will improve 06/07/2023 0327 by Rae Bugler, RN Outcome: Progressing 06/07/2023 0325 by Rae Bugler, RN Outcome: Progressing Goal: Will remain free from infection 06/07/2023 0327 by Rae Bugler, RN Outcome: Progressing 06/07/2023 0325 by Rae Bugler, RN Outcome: Progressing Goal: Diagnostic test results will improve 06/07/2023 0327 by Rae Bugler, RN Outcome: Progressing 06/07/2023 0325 by Rae Bugler, RN Outcome: Progressing Goal: Respiratory complications will improve 06/07/2023 0327 by Rae Bugler, RN Outcome: Progressing 06/07/2023 0325 by Rae Bugler, RN Outcome: Progressing Goal: Cardiovascular complication will be avoided 06/07/2023 0327 by Rae Bugler, RN Outcome: Progressing 06/07/2023 0325 by Rae Bugler, RN Outcome: Progressing   Problem: Activity: Goal: Risk for activity intolerance will decrease 06/07/2023 0327 by Rae Bugler, RN Outcome: Progressing 06/07/2023 0325 by Rae Bugler, RN Outcome: Progressing   Problem: Nutrition: Goal: Adequate nutrition will be maintained 06/07/2023 0327 by Rae Bugler, RN Outcome: Progressing 06/07/2023 0325 by Rae Bugler, RN Outcome: Progressing   Problem: Coping: Goal: Level of anxiety will decrease 06/07/2023 0327 by Rae Bugler, RN Outcome:  Progressing 06/07/2023 0325 by Rae Bugler, RN Outcome: Progressing   Problem: Elimination: Goal: Will not experience complications related to bowel motility 06/07/2023 0327 by Rae Bugler, RN Outcome: Progressing 06/07/2023 0325 by Rae Bugler, RN Outcome: Progressing Goal: Will not experience complications related to urinary retention 06/07/2023 0327 by Rae Bugler, RN Outcome: Progressing 06/07/2023 0325 by Rae Bugler, RN Outcome: Progressing   Problem: Pain Managment: Goal: General experience of comfort will improve and/or be controlled 06/07/2023 0327 by Rae Bugler, RN Outcome: Progressing 06/07/2023 0325 by Rae Bugler, RN Outcome: Progressing   Problem: Safety: Goal: Ability to remain free from injury will improve 06/07/2023 0327 by Rae Bugler, RN Outcome: Progressing 06/07/2023 0325 by Rae Bugler, RN Outcome: Progressing   Problem: Skin Integrity: Goal: Risk for impaired skin integrity will decrease 06/07/2023 0327 by Rae Bugler, RN Outcome: Progressing 06/07/2023 0325 by Rae Bugler, RN Outcome: Progressing   Problem: Activity: Goal: Ability to tolerate increased activity will improve 06/07/2023 0327 by Rae Bugler, RN Outcome: Progressing 06/07/2023 0325 by Rae Bugler, RN Outcome: Progressing   Problem: Clinical Measurements: Goal: Ability to maintain a body temperature in the normal range will improve 06/07/2023 0327 by Rae Bugler, RN Outcome: Progressing 06/07/2023 0325 by Rae Bugler, RN Outcome: Progressing   Problem: Respiratory: Goal: Ability to maintain adequate ventilation will improve 06/07/2023 0327 by Rae Bugler, RN Outcome: Progressing 06/07/2023 0325 by Rae Bugler, RN Outcome: Progressing Goal: Ability to maintain a clear airway will improve 06/07/2023 0327 by Rae Bugler, RN Outcome: Progressing 06/07/2023 0325 by Rae Bugler, RN Outcome: Progressing

## 2023-06-07 NOTE — Telephone Encounter (Signed)
 2-3 week hospital f/u

## 2023-06-07 NOTE — Progress Notes (Signed)
 Pt taken outside to private vehicle via wheelchair.  Pt discharged home in stable condition with all belongings.  Daughter with pt.

## 2023-06-09 ENCOUNTER — Telehealth: Payer: Self-pay | Admitting: *Deleted

## 2023-06-09 NOTE — Progress Notes (Signed)
 Complex Care Management Note Care Guide Note  06/09/2023 Name: Selena [name] MRN: [id number] DOB: 1935-05-25   Complex Care Management Outreach Attempts: An unsuccessful telephone outreach was attempted today to offer the patient information about available complex care management services.  Follow Up Plan:  Additional outreach attempts will be made to offer the patient complex care management information and services.   Encounter Outcome:  No Answer  Barnie Bora  [facility]  [facility], [facility] Guide  Direct Dial: [phone]  Fax [phone]

## 2023-06-12 NOTE — Telephone Encounter (Signed)
 According to patients chart, she has been scheduled to see MR 7/31. Nothing further needed.

## 2023-06-12 NOTE — Progress Notes (Signed)
 Complex Care Management Note Care Guide Note  06/12/2023 Name: Selena [name] MRN: [id number] DOB: March 16, 1935   Complex Care Management Outreach Attempts: A second unsuccessful outreach was attempted today to offer the patient with information about available complex care management services.  Follow Up Plan:  Additional outreach attempts will be made to offer the patient complex care management information and services.   Encounter Outcome:  No Answer  Barnie Bora  [facility]  [facility], [facility] Guide  Direct Dial: [phone]  Fax [phone]

## 2023-06-13 NOTE — Progress Notes (Signed)
 Complex Care Management Note Care Guide Note  06/13/2023 Name: Selena [name] MRN: [id number] DOB: August 04, 1935   Complex Care Management Outreach Attempts: A third unsuccessful outreach was attempted today to offer the patient with information about available complex care management services.  Follow Up Plan:  No further outreach attempts will be made at this time. We have been unable to contact the patient to offer or enroll patient in complex care management services.  Encounter Outcome:  No Answer  Barnie Bora  [facility]  [facility], [facility] Guide  Direct Dial: [phone]  Fax [phone]

## 2023-06-30 ENCOUNTER — Encounter (HOSPITAL_COMMUNITY)
Admission: RE | Admit: 2023-06-30 | Discharge: 2023-06-30 | Disposition: A | Discharge status: Home / Self Care | Source: Ambulatory Visit | Attending: Hematology and Oncology | Admitting: Hematology and Oncology

## 2023-06-30 ENCOUNTER — Encounter (HOSPITAL_COMMUNITY)

## 2023-06-30 DIAGNOSIS — C50411 Malignant neoplasm of upper-outer quadrant of right female breast: Secondary | ICD-10-CM | POA: Insufficient documentation

## 2023-06-30 DIAGNOSIS — C8203 Follicular lymphoma grade I, intra-abdominal lymph nodes: Secondary | ICD-10-CM | POA: Diagnosis present

## 2023-06-30 DIAGNOSIS — Z170 Estrogen receptor positive status [ER+]: Secondary | ICD-10-CM | POA: Diagnosis present

## 2023-06-30 LAB — GLUCOSE, CAPILLARY: Glucose-Capillary: 100 mg/dL — ABNORMAL HIGH (ref 70–99)

## 2023-06-30 MED ORDER — FLUDEOXYGLUCOSE F - 18 (FDG) INJECTION
8.8000 | Freq: Once | INTRAVENOUS | Status: AC | PRN
Start: 2023-06-30 — End: 2023-06-30
  Administered 2023-06-30: 8.8 via INTRAVENOUS

## 2023-07-03 ENCOUNTER — Inpatient Hospital Stay: Payer: Medicare Other | Admitting: Hematology and Oncology

## 2023-07-03 NOTE — Assessment & Plan Note (Deleted)
 Retroperitoneal mass biopsy left side 07/24/2014: Follicular lymphoma low-grade grade 1-2, flow cytometry positive for CD10, 20, 3, 10, 5, BCL-2, BCl-6, CD21, Ki-67 less than 10%, stage IB (8X 4 cm mass)   Prior treatment:systemic chemotherapy with bendamustine  and Rituxan  6 cycles (  From 08/13/2014 to 01/01/2015) followed by Rituxan  maintenance every 2 months for 2 years completed 12/06/2016   CT CAP 12/10/2021: Multiple lymph nodes/soft tissue nodules left common iliac chain and in the sigmoid mesocolon, diffuse subpleural reticulation, 6 mm right upper lobe pulmonary nodule stable   Patient received a second opinion consultation with Dr. Rosaline Coma who recommended watchful monitoring and no immediate treatment. CT CAP 06/27/2022: Stable left pelvic sidewall and sigmoid mesocolon lymph nodes PET/CT 06/30/2023: Interval improvement in the previously demonstrated hypermetabolic pelvic nodes.  No extranodal disease  Therefore we will continue to watch and monitor with annual scans.

## 2023-07-06 ENCOUNTER — Inpatient Hospital Stay: Attending: Hematology and Oncology | Admitting: Hematology and Oncology

## 2023-07-06 DIAGNOSIS — C8203 Follicular lymphoma grade I, intra-abdominal lymph nodes: Secondary | ICD-10-CM | POA: Diagnosis not present

## 2023-07-06 NOTE — Progress Notes (Signed)
 HEMATOLOGY-ONCOLOGY TELEPHONE VISIT PROGRESS NOTE  I connected with our patient on 07/06/23 at  2:30 PM EDT by telephone and verified that I am speaking with the correct person using two identifiers.  I discussed the limitations, risks, security and privacy concerns of performing an evaluation and management service by telephone and the availability of in person appointments.  I also discussed with the patient that there may be a patient responsible charge related to this service. The patient expressed understanding and agreed to proceed.   History of Present Illness: Telephone follow-up to discuss results of PET CT scan  History of Present Illness Selena [name] is a 88 year old with a prior history of breast cancer but also history of follicular lymphoma that was treated in 2016 with Bendamustine  and Rituxan  followed by Rituxan  maintenance.  She has had enlarged lymph nodes and we are watching and monitoring it.  She had a recent PET CT scan that showed stable findings and slight improvement in the lymphadenopathy as well.  She was recently hospitalized in May with pneumonia and she has multiple appointments and follow-ups to check up on that.    Oncology History  Breast cancer of upper-outer quadrant of right female breast ([name])  04/14/2011 Surgery   Right mastectomy: 2 foci of invasive ductal carcinoma 0.2 cm, grade 1 with background extensive DCIS, 0/5 lymph nodes negative,focus 1: ER 94%, PR 100%, Ki-67 5%, HER-2 negative: Focus to ER 100%BR 6% Ki-67 29% HER-2 negative   05/02/2011 -  Anti-estrogen oral therapy   letrozole  2.5 mg daily   Follicular 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 ([name])  07/24/2014 Initial Diagnosis   Retroperitoneal mass biopsy left side: Follicular lymphoma low-grade grade 1-2, flow cytometry positive for CD10, 20, 3, 10, 5, BCL-2, BCl-6, CD21, Ki-67 less than 10%   08/13/2014 - 12/06/2016 Chemotherapy   Bendamustine  and Rituxan  day 1, 2 every 4 weeks 6  cycles followed by Rituxan  maintenance every 2 months 2 years completed 12/06/2016   11/03/2014 Imaging   midpoint of chemotherapy: Moderate improvement in the periaortic lymphadenopathy without complete resolution   01/16/2015 PET scan    interval decrease in size and metabolic activity of the large left retroperitoneal mass , no increase in activity, size 10 mm decreased from 48 mm  SUV 1.9 , no additional hypermetabolic activity     REVIEW OF SYSTEMS:   Constitutional: Denies fevers, chills or abnormal weight loss All other systems were reviewed with the patient and are negative. Observations/Objective:     Assessment Plan:  Follicular 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 ([name]) Retroperitoneal mass biopsy left side 07/24/2014: Follicular lymphoma low-grade grade 1-2, flow cytometry positive for CD10, 20, 3, 10, 5, BCL-2, BCl-6, CD21, Ki-67 less than 10%, stage IB (8X 4 cm mass)   Prior treatment:systemic chemotherapy with bendamustine  and Rituxan  6 cycles (  From 08/13/2014 to 01/01/2015) followed by Rituxan  maintenance every 2 months for 2 years completed 12/06/2016   CT CAP 12/10/2021: Multiple lymph nodes/soft tissue nodules left common iliac chain and in the sigmoid mesocolon, diffuse subpleural reticulation, 6 mm right upper lobe pulmonary nodule stable   Patient received a second opinion consultation with Dr. Rosaline Coma who recommended watchful monitoring and no immediate treatment. CT CAP 06/27/2022: Stable left pelvic sidewall and sigmoid mesocolon lymph nodes PET/CT 06/30/2023: Interval improvement of pelvic lymph nodes, new extranodal metastatic disease   Labs and PET CT scan in 1 year follow-up    I discussed the assessment and treatment plan with  the patient. The patient was provided an opportunity to ask questions and all were answered. The patient agreed with the plan and demonstrated an understanding of the instructions. The patient was advised to call back or  seek an in-person evaluation if the symptoms worsen or if the condition fails to improve as anticipated.   I provided 20 minutes of non-face-to-face time during this encounter.  This includes time for charting and coordination of care   Margert Sheerer, MD

## 2023-07-06 NOTE — Assessment & Plan Note (Signed)
 Retroperitoneal mass biopsy left side 07/24/2014: Follicular lymphoma low-grade grade 1-2, flow cytometry positive for CD10, 20, 3, 10, 5, BCL-2, BCl-6, CD21, Ki-67 less than 10%, stage IB (8X 4 cm mass)   Prior treatment:systemic chemotherapy with bendamustine  and Rituxan  6 cycles (  From 08/13/2014 to 01/01/2015) followed by Rituxan  maintenance every 2 months for 2 years completed 12/06/2016   CT CAP 12/10/2021: Multiple lymph nodes/soft tissue nodules left common iliac chain and in the sigmoid mesocolon, diffuse subpleural reticulation, 6 mm right upper lobe pulmonary nodule stable   Patient received a second opinion consultation with Dr. Rosaline Coma who recommended watchful monitoring and no immediate treatment. CT CAP 06/27/2022: Stable left pelvic sidewall and sigmoid mesocolon lymph nodes PET/CT 06/30/2023: Interval improvement of pelvic lymph nodes, new extranodal metastatic disease   Labs and PET CT scan in 1 year follow-up

## 2023-07-14 ENCOUNTER — Telehealth: Payer: Self-pay | Admitting: Pharmacist

## 2023-07-14 NOTE — Telephone Encounter (Signed)
 Patient contacted for follow-up of need for Eliquis  Patient reports she paid for 1 prescription fill of Eliquis  with a cost of ~ $100 (this was for a 3 month supply).  Patient reports she receives $3001 per month income.   Given her drug spend for her entire medication list, I believe she may be close to 3% of her annual drug spend.  Likely ~ $1100 -   Contacted her pharmacy - [facility]- [address] [place] to determine drug spend for the year.  After prolonged hold, and being placed on hold multiple times, the pharmacy states the patient will need to pick-up the report.  They did not have the ability to sum the total spend and would not take the time to provide a summary.   Patient will need to pick-up drug spend report from January 1 - 2025 until current.  Then we can proceed.   Returned call to patient to explain next steps.  She will ask son to pick-up drug spend report from pharmacy.  She verbalized understanding that if the spend was > $1100 could help with next application step to help with Eliquis .  If drug spend is > $1100, I asked patient to call us  and then [facility], can potentially help her MAP application.   Total time with patient call and documentation of interaction: 37 minutes.  F/U Phone call planned: None

## 2023-07-14 NOTE — Telephone Encounter (Signed)
-----   Message from Cristal Don sent at 06/23/2023  2:02 PM EDT ----- Regarding: FW: Eliquis  - LIS?  ----- Message ----- From: Leilana Mcquire G, RPH-CPP Sent: 06/20/2023  12:00 AM EDT To: Abbey Abbe, RPH-CPP Subject: FW: Eliquis  - LIS?                             LIS - post hospitalization? ----- Message ----- From: Jerrine Urschel G, RPH-CPP Sent: 06/06/2023  12:00 AM EDT To: Abbey Abbe, RPH-CPP Subject: Eliquis  - LIS?

## 2023-07-17 NOTE — Telephone Encounter (Signed)
 Reviewed and agree with Dr Macky Lower plan.

## 2023-07-18 ENCOUNTER — Other Ambulatory Visit (HOSPITAL_COMMUNITY): Payer: Self-pay

## 2023-07-18 ENCOUNTER — Encounter: Payer: Self-pay | Admitting: Hematology and Oncology

## 2023-07-18 ENCOUNTER — Telehealth: Payer: Self-pay

## 2023-07-18 NOTE — Telephone Encounter (Signed)
 Patient contacted for follow-up of Eliquis  (apixaban ) supply availability.  Patient checker her bottle while on the phone with me and stated she believes that she has about 90 pills (45 day supply).   We discussed that she has not spent enough yet to be approved for indigent coverage for 2025 from manufacturer.   She verbalized understanding that she may need to purchase one more additional month.  She was also told to contact use for potential sample use if finances would create a gap in her treatment.  Patient denies any significant medication related side effects.  Patient verbalized understanding of treatment plan.   Total time with patient call and documentation of interaction: 12 minutes.  F/U Phone call planned: None

## 2023-07-18 NOTE — Telephone Encounter (Signed)
 Pt returned call regarding Eliquis  assistance.  Was able to get her oop expense from the pharmacy. Patient has spent $815.   Monthly income is about $3000 ($36,000 annually). Estimated 3% of out of pocket is about $1080.   Patient would still like to complete [employer] PAP application. She was also informed there will be information on the application about how to apply for LIS/Extra Help in the meantime (patient most likely over income for this), and told info about medicare payment plan option.   Will mail application to patients home.

## 2023-07-18 NOTE — Progress Notes (Unsigned)
 Pharmacy Medication Assistance Program Note    08/31/2023  Patient ID: Selena [name], female   DOB: 1935/12/27, 88 y.o.   MRN: [id number]     07/18/2023  Outreach Medication One  Manufacturer Medication One [employer]  [employer] Eliquis   Dose of Eliquis  5MG   Type of Radiographer, therapeutic Assistance  Date Application Sent to Patient 07/18/2023  Application Items Requested Application  Date Application Received From Patient 08/16/2023  Application Items Received From Patient Application;Proof of Out of Pocket Spend  Date Application Received From Provider 08/29/2023  Date Application Submitted to Manufacturer 08/29/2023  Method Application Sent to Manufacturer Fax  Patient Assistance Determination Denied  Patient Denial Reasons Has Not Met Out of Pocket Spend     Denied:

## 2023-07-19 NOTE — Telephone Encounter (Signed)
 Reviewed and agree with Dr Macky Lower plan.

## 2023-08-24 ENCOUNTER — Encounter: Payer: Self-pay | Admitting: Internal Medicine

## 2023-08-24 ENCOUNTER — Ambulatory Visit: Admitting: Internal Medicine

## 2023-08-24 VITALS — BP 140/70 | HR 94 | Temp 98.0°F | Ht 65.0 in | Wt 178.6 lb

## 2023-08-24 DIAGNOSIS — R053 Chronic cough: Secondary | ICD-10-CM | POA: Diagnosis not present

## 2023-08-24 DIAGNOSIS — R062 Wheezing: Secondary | ICD-10-CM | POA: Diagnosis not present

## 2023-08-24 DIAGNOSIS — J849 Interstitial pulmonary disease, unspecified: Secondary | ICD-10-CM

## 2023-08-24 DIAGNOSIS — Z09 Encounter for follow-up examination after completed treatment for conditions other than malignant neoplasm: Secondary | ICD-10-CM

## 2023-08-24 DIAGNOSIS — Z8701 Personal history of pneumonia (recurrent): Secondary | ICD-10-CM

## 2023-08-24 MED ORDER — TRELEGY ELLIPTA 100-62.5-25 MCG/ACT IN AEPB: 1.0000 | INHALATION_SPRAY | Freq: Every day | RESPIRATORY_TRACT | Status: DC

## 2023-08-24 NOTE — Progress Notes (Signed)
 Chief complaint: Consult for dyspnea IOV 09/14/2021  HPI: 88 year old with history of OSA on CPAP, CHF, breast cancer, hypertension, non-Hodgkin's lymphoma, chronic A-fib on Eliquis  Referred by Dr. Neda for evaluation of abnormal CT scan She has history of chronic intermittent dyspnea on exertion.  No symptoms at rest.  History notable for COVID-19 pneumonia requiring hospitalization February 2022.  Had a repeat hospitalization in March 2022 with severe sepsis due to E. coli bacteremia and UTI.  She has history of mild OSA on CPAP and follows with Dr. 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  No feather pillows or comforter ILD questionnaire 03/23/2020-negative Smoking history: Remote smoking history.  Quit in the 1980s Travel history: No significant travel history Relevant family history: No family history of lung disease  Interim history: Here for review of follow-up CT Continues to have significant nasal congestion with discharge and cough which is affecting her quality of life  OV 08/24/2023 transfer of care to Dr. Geronimo in the ILD center -   Subjective:  Patient ID: Selena [name], female , DOB: Jun 06, 1935 , age 88 y.o. , MRN: [id number] , ADDRESS: [address] [place] [address] PCP McDiarmid, Krystal BIRCH, MD Patient Care Team: McDiarmid, Krystal BIRCH, MD as PCP - General (Family Medicine) Verlin Lonni BIRCH, MD as PCP - Cardiology (Cardiology) Neda Jennet LABOR, MD as Consulting Physician (Pulmonary Disease) Vernetta Lonni GRADE, MD as Consulting Physician (Orthopedic Surgery) Odean Potts, MD as Consulting Physician (Hematology and Oncology) [facility], Donell Cardinal, MD as Consulting Physician (Endocrinology) Cara Elida HERO, NP as Nurse Practitioner (Gastroenterology) Waddell Danelle ORN, MD as Consulting Physician (Cardiology) Ebbie Cough, MD as Consulting Physician  (General Surgery) Josh Server, MD as Referring Physician (Ophthalmology)  This Provider for this visit: Treatment Team:  Attending Provider: Geronimo Amel, MD    08/24/2023 -   Chief Complaint  Patient presents with   Consult    ILD Pt experiences SOB with exertion. Pt tries to walk 1 mile a day but has been unable to recently due to the heat.       HPI Selena [name] 88 y.o. -has sleep apnea and follows with Dr. MALVA.  She has other issues such as hypertension hyperlipidemia A-fib remote breast cancer status post with mastectomy and reconstruction, acid reflux.  In February 2022 had COVID and hospitalized then in March 2022 had E. coli sepsis and hospitalized.  In August 2023 did see Dr. Theophilus for ILD evaluation.  It appeared that the CT scan was stabilized and she was asked to come back in 6 months but she had not followed up.  Review of the scans indicate that some 6 months prior to the COVID in February 2022 there was ILD onset [personal visualization and impression].  This was on the scan in April 2021.  There is also reports of this is the September 2020 scan.  Definitely after COVID it is more prominent.  The final high-resolution CT chest was in August 2023 with groundglass opacities craniocaudal gradient reticulation but no honeycombing.  Pattern of post-COVID or NSIP and is felt to be stable compared to the COVID admission but in my opinion more progressive compared to pre-COVID.  Against his backdrop she got admitted for 3 days mid May 2025 with respiratory failure.  She was presented with pain.  CT chest demonstrated multifocal pneumonia and underlying known ILD.  She is treated with antibiotics and discharge.  Since  discharge she is better from a pneumonia perspective but she tells me that she is having worsening dyspnea on exertion for the last 1 month and also some wheezing.  She says she was discharged with [facility] but it is not helping her.  CMA checked a technique of  taking the inhaler and it was faulty and she was retrained.  She is really frustrated with the symptoms  Exercise hypoxemia test shows she is not desaturating here in the office.  Some years ago limited autoimmune panel was negative. .    SYMPTOM SCALE - ILD 08/24/2023  Current weight   O2 use ra  Shortness of Breath 0 -> 5 scale with 5 being worst (score 6 If unable to do)  At rest 2  Simple tasks - showers, clothes change, eating, shaving 5  Household (dishes, doing bed, laundry) 2  Shopping 2  Walking level at own pace 5  Walking up Stairs 5  Total (30-36) Dyspnea Score 21  How bad is your cough? 3  How bad is your fatigue 4  How bad is appetiee 1  How bad is nausea 0  How bad is vomiting?  0  How bad is diarrhea? 4  How bad is anxiety? 3  How bad is depression 3  Any chronic pain - if so where and how bad no       SIT STAND TEST - goal 15 times   08/24/2023    O2 used ra   PRobe - finter or forehead finger   Number sit and stand completed - goal 15 15   Time taken to complete Slow 1 min and 25 sec   Resting Pulse Ox/HR/Dyspnea  96% and 82/min and dyspnea of 0/10    Peak measures 97 % and 110/min and dyspnea of 8.5/10   Final Pulse Ox/HR 95% and 85/min and dyspnea of 6/10   Desaturated </= 88% no   Desaturated <= 3% points no   Got Tachycardic >/= 90/min yes   Miscellaneous comments Very dyspnec       CT Chest data from date: angio may 2024  - personally visualized and independently interpreted : yes - my findings are: agree  IMPRESSION: 1. No evidence for thoracoabdominal aortic aneurysm. No thoracoabdominal aortic dissection or acute intramural hematoma. 2. Areas of subpleural reticulation and subpleural patchy ground-glass attenuation in the lungs bilaterally, suggesting background chronic interstitial lung disease, similar to mildly progressive in the interval. 3. New cluster of patchy/nodular ground-glass opacity in the peripheral right lung and  new patchy and nodular consolidative opacities in the inferior left upper lobe and lingula. 18 mm area of nodular consolidative disease in the paraspinal right lower lobe. Imaging features are compatible with multifocal pneumonia but given nodular character in some regions warrant close follow-up. Repeat CT chest in 3 months recommended. 4. Subtle nodularity of liver contour suggests cirrhosis. 5. Small hiatal hernia. 6. Left colonic diverticulosis without diverticulitis. 7. Pelvic floor laxity with cystocele. 8.  Aortic Atherosclerosis (ICD10-I70.0).     Electronically Signed   By: Camellia Candle M.D.   On: 06/05/2023 05:59  PFT     Latest Ref Rng & Units 03/22/2021    9:46 AM 10/10/2017   11:50 AM  PFT Results  FVC-Pre L 2.41  2.44   FVC-Predicted Pre % 84  81   FVC-Post L 2.37  2.40   FVC-Predicted Post % 82  80   Pre FEV1/FVC % % 81  81   Post FEV1/FCV % %  87  87   FEV1-Pre L 1.94  1.98   FEV1-Predicted Pre % 91  88   FEV1-Post L 2.07  2.09   DLCO uncorrected ml/min/mmHg 17.92  17.08   DLCO UNC% % 87  60   DLCO corrected ml/min/mmHg 17.92    DLCO COR %Predicted % 87    DLVA Predicted % 139  83   TLC L 3.56  4.43   TLC % Predicted % 64  80   RV % Predicted % 50  80        LAB RESULTS last 96 hours No results found.       has a past medical history of Acute gastric ulcer with hemorrhage, Acute on chronic diastolic congestive heart failure ([name]) (10/16/2018), AKI (acute kidney injury) ([name]), Anemia, Aneurysm (arteriovenous) of coronary vessels, Ankle edema, bilateral (01/08/2021), Atrial fibrillation ([name]) (01/01/2007), Atrial fibrillation, permanent ([name]) (01/01/2007), BACK PAIN, UPPER (07/09/2008), Breast cancer of upper-outer quadrant of right female breast ([name]) (03/21/2011), Breast cancer, stage 1 ([name]), Bruises easily, Cataract, Complication of anesthesia, COVID-19 virus infection (03/08/2020), Diverticulitis, DIVERTICULOSIS, COLON (12/26/2006), Essential  hypertension (10/27/2017), Follicular 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 ([name]) (07/31/2014), Follicular non-Hodgkin's lymphoma ([name]), Frozen shoulder, Left (01/08/2021), Full dentures (01/08/2021), Goiter, Gustatory rhinitis (01/07/2021), Hemorrhoids (04/08/2021), History of atrioventricular nodal ablation (10/16/2018), History of cardiac radiofrequency ablation (01/25/2007), colonoscopy (2009), Incontinence of feces (04/08/2021), Influenza (12/18/2020), Insomnia (09/10/2014), Interstitial lung disease ([name]), LIVER FUNCTION TESTS, ABNORMAL, HX OF (12/26/2007), Macular degeneration, bilateral (01/08/2021), Memory loss (05/16/2007), Mild cognitive impairment (09/23/2021), Multiple gastric ulcers (2021), Nocturnal hypoxemia (05/14/2020), OSTEOARTHRITIS (12/26/2006), Osteoarthritis (12/26/2006), Pain in right knee (11/28/2022), Paraspinal mass (07/16/2014), Pneumonia (11/11/2017), Pneumonia (01/31/2018), Pneumonia (01/16/2023), Pneumonia due to COVID-19 virus (03/08/2020), PONV (postoperative nausea and vomiting), Restrictive lung disease (04/05/2021), S/P ablation of atrial flutter (07/17/2018), Sensorineural hearing loss (SNHL) of both ears (01/07/2021), Sepsis ([name]) (04/02/2020), Shortness of breath (05/28/2019), Skin cancer, Syncope (08/21/2018), Thoracic aortic aneurysm ([name]) (10/16/2018), Tibialis posterior tendinopathy (04/08/2019), Unspecified disorder of synovium and tendon, right lower leg (04/08/2019), Upper airway cough syndrome (10/27/2017), Urinary frequency (03/22/2022), Vasomotor rhinitis (01/08/2021), VERTIGO (09/07/2009), Visual impairment (01/07/2021), Wears dentures, Wears glasses, and Wears hearing aid.   reports that she quit smoking about 45 years ago. Her smoking use included cigarettes. She started smoking about 65 years ago. She has a 2 pack-year smoking history. She has been exposed to tobacco smoke. She has never used smokeless tobacco.  Past Surgical History:  Procedure  Laterality Date   ABDOMINAL HYSTERECTOMY     ABLATION OF DYSRHYTHMIC FOCUS  2009   APPENDECTOMY     AUGMENTATION MAMMAPLASTY Right 2013   Subpectoral implant   BIOPSY  05/07/2019   Procedure: BIOPSY;  Surgeon: Wilhelmenia Aloha Raddle., MD;  Location: [facility];  Service: Gastroenterology;;   BREAST BIOPSY  2013   BREAST RECONSTRUCTION  04/14/2011   Procedure: BREAST RECONSTRUCTION;  Surgeon: Alm Sick, MD;  Location: [facility] OR;  Service: Plastics;  Laterality: Right;  Placement of Right Breast Tissue Expander with  use of Flex HD for Breast Reconstruction   BREAST SURGERY     right sm, snbx   CATARACT EXTRACTION W/ INTRAOCULAR LENS  IMPLANT, BILATERAL  2010   bilateral   COLONOSCOPY     COLONOSCOPY     ESOPHAGOGASTRODUODENOSCOPY (EGD) WITH PROPOFOL  N/A 05/07/2019   Procedure: ESOPHAGOGASTRODUODENOSCOPY (EGD) WITH PROPOFOL ;  Surgeon: Wilhelmenia Aloha Raddle., MD;  Location: [facility];  Service: Gastroenterology;  Laterality: N/A;   KNEE SURGERY  2004/2010   arthroscopic/ bilateral knee  replacements   LOOP RECORDER IMPLANT     MASTECTOMY Right 2013   PORT-A-CATH REMOVAL N/A 12/07/2016   Procedure: REMOVAL PORT-A-CATH;  Surgeon: Ebbie Cough, MD;  Location: [facility] OR;  Service: General;  Laterality: N/A;   PORTACATH PLACEMENT Right 08/11/2014   Procedure: INSERTION PORT-A-CATH WITH ULTRASOUND;  Surgeon: Cough Ebbie, MD;  Location: [facility] OR;  Service: General;  Laterality: Right;   ROTATOR CUFF REPAIR  1999   right   tmi  1998   TONSILLECTOMY     TOTAL KNEE ARTHROPLASTY Bilateral     Allergies  Allergen Reactions   Codeine  Other (See Comments)    Hallucinations.  Pt states she can take this now that she can no longer see due to Macular degeneration  Other Reaction(s): Other  Hallucinations.  Pt states she can take this now that she can no longer see due to Macular degeneration     Hallucinations   Codeine  Phosphate Other (See Comments)    Hallucinations   Adhesive  [Tape] Other (See Comments)    Electrodes for EKG- skin blistering, erythema    Immunization History  Administered Date(s) Administered   Fluad Quad(high Dose 65+) 09/26/2018   Influenza Split 10/20/2011   Influenza Whole 11/07/2006   Influenza, High Dose Seasonal PF 10/13/2016, 10/12/2017   Influenza-Unspecified 09/07/2013, 10/15/2015, 09/26/2018, 10/01/2019   Moderna Sars-Covid-2 Vaccination 02/04/2019, 03/04/2019, 12/24/2019   Pfizer Covid-19 Vaccine Bivalent Booster 36yrs & up 01/07/2021   Pfizer(Comirnaty)Fall Seasonal Vaccine 12 years and older 06/23/2022   Pneumococcal Conjugate-13 08/26/2013   Pneumococcal Polysaccharide-23 10/29/2007, 08/28/2017   Td 05/27/2008   Tdap 05/30/2018   Zoster, Live 10/29/2007    Family History  Problem Relation Age of Onset   Colon cancer Mother    Cancer Mother        colon   Colon cancer Maternal Aunt    Cancer Maternal Uncle    Prostate cancer Maternal Uncle    Prostate cancer Maternal Uncle    Other Maternal Uncle    Cancer Sister        kidney   Cancer Brother        lymph node   Anesthesia problems Neg Hx    Hypotension Neg Hx    Malignant hyperthermia Neg Hx    Pseudochol deficiency Neg Hx    Breast cancer Neg Hx      Current Outpatient Medications:    albuterol  (VENTOLIN  HFA) 108 (90 Base) MCG/ACT inhaler, INHALE 2 PUFFS INTO THE LUNGS EVERY 4 HOURS AS NEEDED FOR WHEEZING OR SHORTNESS OF BREATH, Disp: 18 g, Rfl: 5   amLODipine  (NORVASC ) 2.5 MG tablet, Take 1 tablet (2.5 mg total) by mouth at bedtime., Disp: 90 tablet, Rfl: 3   apixaban  (ELIQUIS ) 5 MG TABS tablet, TAKE 1 TABLET(5 MG) BY MOUTH TWICE DAILY, Disp: 180 tablet, Rfl: 1   famotidine  (PEPCID ) 20 MG tablet, TAKE 1 TABLET(20 MG) BY MOUTH AT BEDTIME, Disp: 90 tablet, Rfl: 1   fluticasone  furoate-vilanterol (BREO ELLIPTA ) 200-25 MCG/ACT AEPB, Inhale 1 puff into the lungs daily., Disp: 60 each, Rfl: 0   Fluticasone -Umeclidin-Vilant (TRELEGY ELLIPTA ) 100-62.5-25 MCG/ACT  AEPB, Inhale 1 puff into the lungs daily., Disp: , Rfl:    montelukast  (SINGULAIR ) 10 MG tablet, TAKE 1 TABLET(10 MG) BY MOUTH AT BEDTIME, Disp: 90 tablet, Rfl: 1   pantoprazole  (PROTONIX ) 40 MG tablet, Take 1 tablet (40 mg total) by mouth 2 (two) times daily., Disp: 180 tablet, Rfl: 3   rosuvastatin  (CRESTOR ) 10 MG tablet, Take 1 tablet (  10 mg total) by mouth daily., Disp: 90 tablet, Rfl: 3   azelastine  (ASTELIN ) 0.1 % nasal spray, PALCE 2 SPRAYS INTO BOTH NOSTRILS ONCE DAILY FOR 1 DOSE. (Patient not taking: Reported on 08/24/2023), Disp: 30 mL, Rfl: 3   feeding supplement (ENSURE ENLIVE / ENSURE PLUS) LIQD, Take 237 mLs by mouth 2 (two) times daily between meals. (Patient not taking: Reported on 08/24/2023), Disp: 237 mL, Rfl: 12   ipratropium (ATROVENT ) 0.06 % nasal spray, Place 2 sprays into both nostrils 4 (four) times daily. (Patient not taking: Reported on 08/24/2023), Disp: 15 mL, Rfl: 12      Objective:   Vitals:   08/24/23 1522  BP: (!) 140/70  Pulse: 94  Temp: 98 F (36.7 C)  TempSrc: Oral  SpO2: 97%  Weight: 178 lb 9.6 oz (81 kg)  Height: 5' 5 (1.651 m)    Estimated body mass index is 29.72 kg/m as calculated from the following:   Height as of this encounter: 5' 5 (1.651 m).   Weight as of this encounter: 178 lb 9.6 oz (81 kg).  @WEIGHTCHANGE @  Filed Weights   08/24/23 1522  Weight: 178 lb 9.6 oz (81 kg)     Physical Exam   General: No distress. frail O2 at rest: no Cane present: yes Sitting in wheel chair: no Frail: yes  Obese: no Neuro: Alert and Oriented x 3. GCS 15. Speech normal Psych: Pleasant Resp:  Barrel Chest - no.  Wheeze - no, Crackles - yes, No overt respiratory distress CVS: Normal heart sounds. Murmurs - no Ext: Stigmata of Connective Tissue Disease - no HEENT: Normal upper airway. PEERL +. No post nasal drip        Assessment/     Assessment & Plan ILD (interstitial lung disease) ([name])  Chronic coughing  Wheezing  Hospital  discharge follow-up  History of pneumonia    PLAN Patient Instructions     ICD-10-CM   1. ILD (interstitial lung disease) ([name])  J84.9 Antinuclear Antib (ANA)    Rheumatoid factor    Cyclic citrul peptide antibody, IgG    Sjogrens syndrome-A extractable nuclear antibody    Sjogrens syndrome-B extractable nuclear antibody    Anti-scleroderma antibody    QuantiFERON-TB Gold Plus    Sed Rate (ESR)    Hypersensitivity pnuemonitis profile    RNP Antibody    Aldolase    ECHOCARDIOGRAM COMPLETE    CT Chest High Resolution    Pulmonary function test    2. Chronic coughing  R05.3 Antinuclear Antib (ANA)    Rheumatoid factor    Cyclic citrul peptide antibody, IgG    Sjogrens syndrome-A extractable nuclear antibody    Sjogrens syndrome-B extractable nuclear antibody    Anti-scleroderma antibody    QuantiFERON-TB Gold Plus    Sed Rate (ESR)    Hypersensitivity pnuemonitis profile    RNP Antibody    Aldolase    ECHOCARDIOGRAM COMPLETE    CT Chest High Resolution    Pulmonary function test    3. Wheezing  R06.2 Antinuclear Antib (ANA)    Rheumatoid factor    Cyclic citrul peptide antibody, IgG    Sjogrens syndrome-A extractable nuclear antibody    Sjogrens syndrome-B extractable nuclear antibody    Anti-scleroderma antibody    QuantiFERON-TB Gold Plus    Sed Rate (ESR)    Hypersensitivity pnuemonitis profile    RNP Antibody    Aldolase    ECHOCARDIOGRAM COMPLETE    CT Chest High Resolution  Pulmonary function test      You do have interstitial lung disease that started sometime in late 2021 [not clearly present in 2020].  Subsequent to that he had COVID in early 2022 and since then there is a lot more prominence with interstitial lung disease.'    Unclear if it is progressive  Could be from interstitial lung disease but need to rule out asthma or allergies [in 2019 allergy  testing is negative]   Plan - Do high-resolution CT chest in 1 month - Do echocardiogram  first available - Do spirometry and DLCO first available - Do blood work for autoimmune disease, QuantiFERON gold, ESR and blood allergy  panel - Continue Breo but do it with you improved technique that we taught you  Follow-up - 6-8 weeks Dr. Geronimo with 30-minute visit  - if symptomatic and features c/w NSIP might have to consider cellcept bcuase biopsy will be risky      FOLLOWUP    Return in about 7 weeks (around 10/12/2023) for 30 min visit, with Dr Geronimo, Face to Face Visit.  ( Level 05 visit E&M 2024: Estb >= 40 min visit type: on-site physical face to visit  in total care time and counseling or/and coordination of care by this undersigned MD - Dr Dorethia Geronimo. This includes one or more of the following on this same day 08/24/2023: pre-charting, chart review, note writing, documentation discussion of test results, diagnostic or treatment recommendations, prognosis, risks and benefits of management options, instructions, education, compliance or risk-factor reduction. It excludes time spent by the CMA or office staff in the care of the patient. Actual time 50 min)   SIGNATURE    Dr. Dorethia Geronimo, M.D., F.C.C.P,  Pulmonary and Critical Care Medicine Staff Physician, [facility] Director - Interstitial Lung Disease  Program  Pulmonary Fibrosis [facility] [place], [place], [address]  Pager: [phone], If no answer or between  15:00h - 7:00h: call [phone] Telephone: [phone]  5:50 PM 08/24/2023

## 2023-08-24 NOTE — Patient Instructions (Addendum)
 ICD-10-CM   1. ILD (interstitial lung disease) ([name])  J84.9 Antinuclear Antib (ANA)    Rheumatoid factor    Cyclic citrul peptide antibody, IgG    Sjogrens syndrome-A extractable nuclear antibody    Sjogrens syndrome-B extractable nuclear antibody    Anti-scleroderma antibody    QuantiFERON-TB Gold Plus    Sed Rate (ESR)    Hypersensitivity pnuemonitis profile    RNP Antibody    Aldolase    ECHOCARDIOGRAM COMPLETE    CT Chest High Resolution    Pulmonary function test    2. Chronic coughing  R05.3 Antinuclear Antib (ANA)    Rheumatoid factor    Cyclic citrul peptide antibody, IgG    Sjogrens syndrome-A extractable nuclear antibody    Sjogrens syndrome-B extractable nuclear antibody    Anti-scleroderma antibody    QuantiFERON-TB Gold Plus    Sed Rate (ESR)    Hypersensitivity pnuemonitis profile    RNP Antibody    Aldolase    ECHOCARDIOGRAM COMPLETE    CT Chest High Resolution    Pulmonary function test    3. Wheezing  R06.2 Antinuclear Antib (ANA)    Rheumatoid factor    Cyclic citrul peptide antibody, IgG    Sjogrens syndrome-A extractable nuclear antibody    Sjogrens syndrome-B extractable nuclear antibody    Anti-scleroderma antibody    QuantiFERON-TB Gold Plus    Sed Rate (ESR)    Hypersensitivity pnuemonitis profile    RNP Antibody    Aldolase    ECHOCARDIOGRAM COMPLETE    CT Chest High Resolution    Pulmonary function test      You do have interstitial lung disease that started sometime in late 2021 [not clearly present in 2020].  Subsequent to that he had COVID in early 2022 and since then there is a lot more prominence with interstitial lung disease.'    Unclear if it is progressive  Could be from interstitial lung disease but need to rule out asthma or allergies [in 2019 allergy  testing is negative]   Plan - Do high-resolution CT chest in 1 month - Do echocardiogram first available - Do spirometry and DLCO first available - Do blood work for  autoimmune disease, QuantiFERON gold, ESR and blood allergy  panel - Continue Breo but do it with you improved technique that we taught you  Follow-up - 6-8 weeks Dr. Geronimo with 30-minute visit  - if symptomatic and features c/w NSIP might have to consider cellcept bcuase biopsy will be risky

## 2023-08-25 ENCOUNTER — Encounter (HOSPITAL_BASED_OUTPATIENT_CLINIC_OR_DEPARTMENT_OTHER): Payer: Self-pay

## 2023-08-25 LAB — SEDIMENTATION RATE: Sed Rate: 9 mm/h (ref 0–30)

## 2023-08-29 LAB — SJOGRENS SYNDROME-A EXTRACTABLE NUCLEAR ANTIBODY: SSA (Ro) (ENA) Antibody, IgG: <1 AI

## 2023-08-29 LAB — QUANTIFERON-TB GOLD PLUS
Mitogen-NIL: >10 [IU]/mL
NIL: 0.04 [IU]/mL
QuantiFERON-TB Gold Plus: NEGATIVE
TB1-NIL: 0 [IU]/mL
TB2-NIL: 0 [IU]/mL

## 2023-08-29 LAB — HYPERSENSITIVITY PNUEMONITIS PROFILE
ASPERGILLUS FUMIGATUS: NEGATIVE
Faenia retivirgula: NEGATIVE
Pigeon Serum: NEGATIVE
S. VIRIDIS: NEGATIVE
T. CANDIDUS: NEGATIVE
T. VULGARIS: NEGATIVE

## 2023-08-29 LAB — RHEUMATOID FACTOR: Rheumatoid fact SerPl-aCnc: <10 [IU]/mL (ref <14)

## 2023-08-29 LAB — ALDOLASE

## 2023-08-29 LAB — RNP ANTIBODY: Ribonucleic Protein(ENA) Antibody, IgG: <1 AI

## 2023-08-29 LAB — SJOGRENS SYNDROME-B EXTRACTABLE NUCLEAR ANTIBODY: SSB (La) (ENA) Antibody, IgG: <1 AI

## 2023-08-29 LAB — ANA: Anti Nuclear Antibody (ANA): NEGATIVE

## 2023-08-29 LAB — ANTI-SCLERODERMA ANTIBODY: Scleroderma (Scl-70) (ENA) Antibody, IgG: <1 AI

## 2023-08-29 LAB — CYCLIC CITRUL PEPTIDE ANTIBODY, IGG: Cyclic Citrullin Peptide Ab: <16 U

## 2023-08-30 ENCOUNTER — Ambulatory Visit: Payer: Self-pay | Admitting: Internal Medicine

## 2023-08-30 NOTE — Progress Notes (Signed)
  Normal serology

## 2023-08-31 ENCOUNTER — Encounter: Payer: Self-pay | Admitting: Family Medicine

## 2023-08-31 ENCOUNTER — Ambulatory Visit: Admitting: Family Medicine

## 2023-08-31 ENCOUNTER — Other Ambulatory Visit: Payer: Self-pay | Admitting: Nurse Practitioner

## 2023-08-31 VITALS — BP 134/81 | HR 80 | Ht 65.0 in | Wt 178.4 lb

## 2023-08-31 DIAGNOSIS — I4821 Permanent atrial fibrillation: Secondary | ICD-10-CM | POA: Diagnosis not present

## 2023-08-31 DIAGNOSIS — I10 Essential (primary) hypertension: Secondary | ICD-10-CM | POA: Diagnosis not present

## 2023-08-31 DIAGNOSIS — R42 Dizziness and giddiness: Secondary | ICD-10-CM

## 2023-08-31 DIAGNOSIS — F5104 Psychophysiologic insomnia: Secondary | ICD-10-CM

## 2023-08-31 DIAGNOSIS — J310 Chronic rhinitis: Secondary | ICD-10-CM | POA: Diagnosis not present

## 2023-08-31 DIAGNOSIS — E049 Nontoxic goiter, unspecified: Secondary | ICD-10-CM

## 2023-08-31 NOTE — Progress Notes (Unsigned)
 Ambulatory walk  97% 90BPM

## 2023-08-31 NOTE — Patient Instructions (Addendum)
  Dr Jhett Fretwell will take care of getting the apixaban  paperwork completd.   Sent in refills on your doxepin  for sleep.  A prescription for meclizine  for your episodes of vertigo.   Selena [name] to [employer] [facility]    08/25/23  2:41 PM Selena [name],    I tried to call you today to schedule an echo that Dr Geronimo had wanted you to schedule. If you can please give me a call [phone].I am available Monday thru Friday [phone].    Thank you so much!

## 2023-09-01 ENCOUNTER — Encounter: Payer: Self-pay | Admitting: Family Medicine

## 2023-09-01 NOTE — Assessment & Plan Note (Signed)
 Atrial fibrillation on anticoagulation Atrial fibrillation managed with apixaban . No recent cardiology follow-up. Echocardiogram recommended by pulmonologist. - Assist with apixaban  prescription paperwork through office staff. - Schedule echocardiogram as recommended by pulmonologist. - Telephone number to schedule transthoracic echocardiogram given to patient.

## 2023-09-01 NOTE — Assessment & Plan Note (Signed)
 Established problem. Stable. Patient is at goal of adequate sleep. No signs of complications, medication side effects, or red flags. Chronic insomnia managed with doxepin , effective in aiding sleep. - Refill doxepin  for insomnia management.

## 2023-09-01 NOTE — Assessment & Plan Note (Signed)
 CT Chest 06/05/23 showed reduction in size of left sided nodule  2.9 cm left thyroid  nodule evident with other smaller bilateral thyroid  nodules present. This has been evaluated on previous imaging Repeat imaging in 05/2024

## 2023-09-01 NOTE — Assessment & Plan Note (Signed)
 Established problem. Adequate blood pressure control.  No evidence of new end organ damage.  Tolerating medication without significant adverse effects.  Plan to continue current blood pressure medication regiment.

## 2023-09-01 NOTE — Progress Notes (Signed)
 Selena [name] is accompanied by sister Sources of clinical information for visit is/are patient and relative(s). Nursing assessment for this office visit was reviewed with the patient for accuracy and revision.   Previous Report(s) Reviewed: office visit Dr Geronimo (08/24/23) Pulm.  And  Dr Shedrick (Onc) 07/06/23 office visit      08/31/2023    3:05 PM  Depression screen PHQ 2/9  Decreased Interest 1  Down, Depressed, Hopeless 1  PHQ - 2 Score 2  Altered sleeping 1  Tired, decreased energy 3  Change in appetite 0  Feeling bad or failure about yourself  1  Trouble concentrating 1  Moving slowly or fidgety/restless 0  Suicidal thoughts 1  PHQ-9 Score 9  Difficult doing work/chores Somewhat difficult   Flowsheet Row Office Visit from 08/31/2023 in [facility] - [facility] Dept Of [place]. [facility] Office Visit from 05/25/2023 in [facility] - [facility] Dept Of [place]. [facility] Office Visit from 04/27/2023 in [facility] - A Dept Of [place]. [facility]  Thoughts that you would be better off dead, or of hurting yourself in some way Several days Not at all Not at all  PHQ-9 Total Score 9 9 6        08/31/2023    3:05 PM 05/25/2023   10:57 AM 04/27/2023   11:07 AM 01/16/2023   11:21 AM 09/12/2022    4:34 PM  Fall Risk   Falls in the past year? 1 0  0 0  Number falls in past yr: 0 0 0 0 0  Injury with Fall? 1 0 1 0 0  Risk for fall due to :    No Fall Risks Impaired balance/gait  Follow up    Falls evaluation completed Falls prevention discussed;Education provided;Falls evaluation completed       08/31/2023    3:05 PM 05/25/2023   10:57 AM 04/27/2023   11:07 AM  PHQ9 SCORE ONLY  PHQ-9 Total Score 9 9 6     There are no preventive care reminders to display for this patient.  Health Maintenance Due  Topic Date Due   Zoster Vaccines- Shingrix (1 of 2) 10/14/1954   Hepatitis B Vaccines (1 of 3 - Risk 3-dose series) Never  done   Medicare Annual Wellness (AWV)  09/12/2023      History/P.E. limitations: mild memory impairment  There are no preventive care reminders to display for this patient. There are no preventive care reminders to display for this patient.  Health Maintenance Due  Topic Date Due   Zoster Vaccines- Shingrix (1 of 2) 10/14/1954   Hepatitis B Vaccines (1 of 3 - Risk 3-dose series) Never done   Medicare Annual Wellness (AWV)  09/12/2023     Chief Complaint  Patient presents with   Medical Management of Chronic Issues     Discussed the use of AI scribe software for clinical note transcription with the patient, who gave verbal consent to proceed.  History of Present Illness  She experiences persistent dizziness that occurs while walking, sitting, and sometimes when standing up. She recently fell in the shower, attributing it to blacking out rather than vertigo, resulting in bruising on her shoulder and lower back. Dizziness is triggered by bending over to pick up towel from bathroom floor, then straightening up.    She has a history of vertigo and dizziness, with temporary relief from a maneuver performed by her granddaughter,  an ear specialist. She avoids performing the maneuver herself due to risk of injury. She has not been walking outside due to the heat but tries to walk a mile indoors daily.  Her blood pressure medication was recently changed to 2.5 mg prior to her fall. She has not seen her pulmonologist recently but has an upcoming HR Chest CT Dizziness occurs when standing up quickly, which she manages by standing still until it passes.  She experiences a runny nose, particularly when eating. She is not currently using any nasal sprays but has used Flonase , Azelastine , and Ipratropium in the past.  She is using a new inhaler, Breo Ellipta , after being instructed on its proper use. She was previously on a different inhaler but was not using it correctly. She gargles after use to  prevent oral blisters.  She takes Doxepin  for sleep, which she finds effective, and Meclizine  for vertigo, using it approximately once a week.    SDOH Screenings   Food Insecurity: No Food Insecurity (06/05/2023)  Housing: Unknown (06/05/2023)  Transportation Needs: No Transportation Needs (06/05/2023)  Utilities: At Risk (06/05/2023)  Alcohol Screen: Low Risk  (09/12/2022)  Depression (PHQ2-9): Medium Risk (08/31/2023)  Financial Resource Strain: Low Risk  (09/12/2022)  Physical Activity: Sufficiently Active (09/12/2022)  Social Connections: Moderately Integrated (06/05/2023)  Stress: No Stress Concern Present (09/12/2022)  Tobacco Use: Medium Risk (08/31/2023)  Health Literacy: Adequate Health Literacy (09/12/2022)   --------------------------------------------------------------------------------------------------------------------------------------------- Visit Problem List with Assessment and Plan   Assessment and Plan Assessment & Plan      Dizziness Dizziness and vertigo with history of fall and brief loss of consciousness Intermittent dizziness and vertigo exacerbated by positional changes, with a recent fall and brief loss of consciousness likely due to postural changes. Risk of falls discussed. Advised against self-performing Epley maneuver. Patient states she takes the meclizine  with vertigo, lays down, falls asleep, and when she awakens the vertigo is resolved.  She reports taking it about once a month.   We discussed risk of falls and cognitive impiarment with meclizine .   - Refill meclizine  for vertigo management #10 /month/ - Encourage use of shower chair to prevent falls. - Consider referral to physical therapy for vertigo management maneuvers, ptient declined.   Atrial fibrillation, permanent ([name]) Atrial fibrillation on anticoagulation Atrial fibrillation managed with apixaban . No recent cardiology follow-up. Echocardiogram recommended by pulmonologist. - Assist with  apixaban  prescription paperwork through office staff. - Schedule echocardiogram as recommended by pulmonologist. - Telephone number to schedule transthoracic echocardiogram given to patient.   Essential hypertension Established problem. Adequate blood pressure control.  No evidence of new end organ damage.  Tolerating medication without significant adverse effects.  Plan to continue current blood pressure medication regiment.    Nonallergic rhinitis Established problem Gustatory rhinorrhea Uncontrolled.  Patient is not at goal of sxs control. Intermittent cough and runny nose during meals, consistent with gustatory rhinorrhea. Discussed non-allergic basis. - Refill Flonase , Azelastine , and Ipratropium nasal sprays for symptomatic relief if needed.    Psychophysiologic insomnia Established problem. Stable. Patient is at goal of adequate sleep. No signs of complications, medication side effects, or red flags. Chronic insomnia managed with doxepin , effective in aiding sleep. - Refill doxepin  for insomnia management.

## 2023-09-01 NOTE — Assessment & Plan Note (Signed)
 Dizziness and vertigo with history of fall and brief loss of consciousness Intermittent dizziness and vertigo exacerbated by positional changes, with a recent fall and brief loss of consciousness likely due to postural changes. Risk of falls discussed. Advised against self-performing Epley maneuver. Patient states she takes the meclizine  with vertigo, lays down, falls asleep, and when she awakens the vertigo is resolved.  She reports taking it about once a month.   We discussed risk of falls and cognitive impiarment with meclizine .   - Refill meclizine  for vertigo management #10 /month/ - Encourage use of shower chair to prevent falls. - Consider referral to physical therapy for vertigo management maneuvers, ptient declined.

## 2023-09-01 NOTE — Assessment & Plan Note (Signed)
 Established problem Gustatory rhinorrhea Uncontrolled.  Patient is not at goal of sxs control. Intermittent cough and runny nose during meals, consistent with gustatory rhinorrhea. Discussed non-allergic basis. - Refill Flonase , Azelastine , and Ipratropium nasal sprays for symptomatic relief if needed.

## 2023-09-06 ENCOUNTER — Telehealth: Payer: Self-pay

## 2023-09-06 ENCOUNTER — Other Ambulatory Visit: Payer: Self-pay | Admitting: Internal Medicine

## 2023-09-06 DIAGNOSIS — R42 Dizziness and giddiness: Secondary | ICD-10-CM

## 2023-09-06 MED ORDER — FLUTICASONE FUROATE-VILANTEROL 200-25 MCG/ACT IN AEPB: 1.0000 | INHALATION_SPRAY | Freq: Every day | RESPIRATORY_TRACT | 5 refills | Status: AC

## 2023-09-06 NOTE — Telephone Encounter (Signed)
 Copied from CRM #[id number]. Topic: Clinical - Medication Refill >> Sep 06, 2023 12:14 PM Corean SAUNDERS wrote: Medication: fluticasone  furoate-vilanterol (BREO ELLIPTA ) 200-25 MCG/ACT AEPB  Has the patient contacted their pharmacy? No, out of refills - patient states this medication was prescribed by a hospital doctor but is requesting Dr. Geronimo to fill this.   (Agent: If no, request that the patient contact the pharmacy for the refill. If patient does not wish to contact the pharmacy document the reason why and proceed with request.) (Agent: If yes, when and what did the pharmacy advise?)  This is the patient's preferred pharmacy:  [id number] [address], [place] - [address] AT [facility] OF [address] & [facility] [address] [place] [address] Phone: [phone] Fax: [phone]  Is this the correct pharmacy for this prescription? Yes If no, delete pharmacy and type the correct one.   Has the prescription been filled recently? Yes  Is the patient out of the medication? Yes  Has the patient been seen for an appointment in the last year OR does the patient have an upcoming appointment? Yes  Can we respond through MyChart? No  Agent: Please be advised that Rx refills may take up to 3 business days. We ask that you follow-up with your pharmacy.

## 2023-09-06 NOTE — Telephone Encounter (Signed)
 Patient calls nurse line in regards to Doxepin  and Meclizine  prescriptions.   She reports she is needing a refill.   I do not see these on her medication list.   Advised will forward to PCP.

## 2023-09-08 MED ORDER — MECLIZINE HCL 25 MG PO TABS: 25.0000 mg | ORAL_TABLET | Freq: Every day | ORAL | 0 refills | Status: AC | PRN

## 2023-09-11 ENCOUNTER — Ambulatory Visit (HOSPITAL_BASED_OUTPATIENT_CLINIC_OR_DEPARTMENT_OTHER)
Admission: RE | Admit: 2023-09-11 | Discharge: 2023-09-11 | Disposition: A | Discharge status: Home / Self Care | Source: Ambulatory Visit | Attending: Internal Medicine | Admitting: Internal Medicine

## 2023-09-11 DIAGNOSIS — R053 Chronic cough: Secondary | ICD-10-CM | POA: Insufficient documentation

## 2023-09-11 DIAGNOSIS — J849 Interstitial pulmonary disease, unspecified: Secondary | ICD-10-CM | POA: Diagnosis present

## 2023-09-11 DIAGNOSIS — R062 Wheezing: Secondary | ICD-10-CM | POA: Insufficient documentation

## 2023-09-13 NOTE — Telephone Encounter (Signed)
 Patient contacted for follow-up of Eliquis  (apixaban ) supply.  Patient reports she has 1 week supply of Eliquis  (apixaban ) remaining in her possession.   We discussed need for a future pharmacy summary report of drug spend needing to be higher than previous report submitted for assistance.   Medication Samples have been provided to the patient.  Drug name: Eliquis  (apixaban )        Strength: 5mg         Qty: 56  ONU:[id number]  Exp.Date: 04/23/2024  Dosing instructions: 1 BID   The patient has been instructed regarding the correct time, dose, and frequency of taking this medication, including desired effects and most common side effects.   Selena [name] 11:29 AM 09/13/2023  Plan is for patient to obtain prescription drug spend at the end of September and resubmit for MAP.   I will plan to contact her to remind her of this task and need to bring that to us  for submission.     Total time with patient call and documentation of interaction: 12 minutes.  F/U Phone call planned: 4-5 weeks

## 2023-09-13 NOTE — Telephone Encounter (Signed)
 Reviewed and agree with Dr Rennis plan.

## 2023-09-18 ENCOUNTER — Ambulatory Visit (INDEPENDENT_AMBULATORY_CARE_PROVIDER_SITE_OTHER): Payer: Medicare Other

## 2023-09-18 VITALS — Ht 65.0 in | Wt 178.0 lb

## 2023-09-18 DIAGNOSIS — Z0000 Encounter for general adult medical examination without abnormal findings: Secondary | ICD-10-CM

## 2023-09-18 NOTE — Progress Notes (Signed)
 Because this visit was a virtual/telehealth visit,  certain criteria was not obtained, such a blood pressure, CBG if applicable, and timed get up and go. Any medications not marked as taking were not mentioned during the medication reconciliation part of the visit. Any vitals not documented were not able to be obtained due to this being a telehealth visit or patient was unable to self-report a recent blood pressure reading due to a lack of equipment at home via telehealth. Vitals that have been documented are verbally provided by the patient.   Subjective:   Selena [name] is a 88 y.o. who presents for a Medicare Wellness preventive visit.  As a reminder, Annual Wellness Visits don't include a physical exam, and some assessments may be limited, especially if this visit is performed virtually. We may recommend an in-person follow-up visit with your provider if needed.  Visit Complete: Virtual I connected with  Selena [name] on 09/18/23 by a audio enabled telemedicine application and verified that I am speaking with the correct person using two identifiers.  Patient Location: Home  Provider Location: Home Office  I discussed the limitations of evaluation and management by telemedicine. The patient expressed understanding and agreed to proceed.  Vital Signs: Because this visit was a virtual/telehealth visit, some criteria may be missing or patient reported. Any vitals not documented were not able to be obtained and vitals that have been documented are patient reported.  VideoDeclined- This patient declined Librarian, academic. Therefore the visit was completed with audio only.  Persons Participating in Visit: Patient.  AWV Questionnaire: No: Patient Medicare AWV questionnaire was not completed prior to this visit.  Cardiac Risk Factors include: advanced age (>70men, >35 women);dyslipidemia;hypertension     Objective:    Today's Vitals   09/18/23  1305  Weight: 178 lb (80.7 kg)  Height: 5' 5 (1.651 m)  PainSc: 0-No pain   Body mass index is 29.62 kg/m.     09/18/2023    1:09 PM 08/31/2023    3:05 PM 06/05/2023    9:29 AM 05/25/2023   10:57 AM 04/27/2023   11:07 AM 09/12/2022    4:35 PM 08/10/2022    6:36 PM  Advanced Directives  Does Patient Have a Medical Advance Directive? Yes No No No No Yes No  Type of Estate agent of [place];Living will     Healthcare Power of [place];Living will   Does patient want to make changes to medical advance directive? No - Patient declined     No - Patient declined   Copy of Healthcare Power of Attorney in Chart? Yes - validated most recent copy scanned in chart (See row information)     Yes - validated most recent copy scanned in chart (See row information)   Would patient like information on creating a medical advance directive? No - Patient declined No - Patient declined No - Patient declined No - Patient declined No - Patient declined      Current Medications (verified) Outpatient Encounter Medications as of 09/18/2023  Medication Sig   albuterol  (VENTOLIN  HFA) 108 (90 Base) MCG/ACT inhaler INHALE 2 PUFFS INTO THE LUNGS EVERY 4 HOURS AS NEEDED FOR WHEEZING OR SHORTNESS OF BREATH   amLODipine  (NORVASC ) 2.5 MG tablet Take 1 tablet (2.5 mg total) by mouth at bedtime.   apixaban  (ELIQUIS ) 5 MG TABS tablet TAKE 1 TABLET(5 MG) BY MOUTH TWICE DAILY   azelastine  (ASTELIN ) 0.1 % nasal spray PALCE 2 SPRAYS INTO BOTH  NOSTRILS ONCE DAILY FOR 1 DOSE. (Patient not taking: Reported on 09/01/2023)   famotidine  (PEPCID ) 20 MG tablet TAKE 1 TABLET(20 MG) BY MOUTH AT BEDTIME   fluticasone  furoate-vilanterol (BREO ELLIPTA ) 200-25 MCG/ACT AEPB Inhale 1 puff into the lungs daily.   ipratropium (ATROVENT ) 0.06 % nasal spray Place 2 sprays into both nostrils 4 (four) times daily. (Patient not taking: Reported on 09/01/2023)   meclizine  (ANTIVERT ) 25 MG tablet Take 1 tablet (25 mg total) by mouth daily  as needed for dizziness.   montelukast  (SINGULAIR ) 10 MG tablet TAKE 1 TABLET(10 MG) BY MOUTH AT BEDTIME   pantoprazole  (PROTONIX ) 40 MG tablet Take 1 tablet (40 mg total) by mouth 2 (two) times daily.   rosuvastatin  (CRESTOR ) 10 MG tablet Take 1 tablet (10 mg total) by mouth daily.   No facility-administered encounter medications on file as of 09/18/2023.    Allergies (verified) Codeine , Codeine  phosphate, and Adhesive [tape]   History: Past Medical History:  Diagnosis Date   Acute gastric ulcer with hemorrhage    Acute on chronic diastolic congestive heart failure ([name]) 10/16/2018   AKI (acute kidney injury) ([name])    Anemia    PMH   Aneurysm (arteriovenous) of coronary vessels    Ankle edema, bilateral 01/08/2021   Arthralgia of right knee 11/28/2022   Atrial fibrillation ([name]) 01/01/2007   Atrial fibrillation, permanent ([name]) 01/01/2007   Qualifier: Diagnosis of   By: Jame  MD, Maude FALCON        BACK PAIN, UPPER 07/09/2008   Breast cancer of upper-outer quadrant of right female breast ([name]) 03/21/2011   Breast cancer, stage 1 ([name])    Bruises easily    Cataract    history   Complication of anesthesia    COVID-19 virus infection 03/08/2020   Disorder of tendon of posterior tibial muscle 04/08/2019   Diverticulitis    DIVERTICULOSIS, COLON 12/26/2006   Essential hypertension 10/27/2017   Follicular 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 ([name]) 07/31/2014   Follicular non-Hodgkin's lymphoma ([name])    Frozen shoulder, Left 01/08/2021   Full dentures 01/08/2021   Goiter    multi-nodular   Gustatory rhinitis 01/07/2021   Hemorrhoids 04/08/2021   History of atrioventricular nodal ablation 10/16/2018   History of cardiac radiofrequency ablation 01/25/2007   Hx of colonoscopy 2009   Incontinence of feces 04/08/2021   Influenza 12/18/2020   Insomnia 09/10/2014   Interstitial lung disease ([name])    LIVER FUNCTION TESTS, ABNORMAL, HX OF 12/26/2007   Macular  degeneration, bilateral 01/08/2021   Memory loss 05/16/2007   Mild cognitive impairment 09/23/2021   Qualifier: Diagnosis of  By: Jame  MD, Maude FALCON    Multifocal pneumonia 06/05/2023   Multiple gastric ulcers 2021   CC Hematemesis; EGD showed mild abnormalities in the esophagus and multiple gastric ulcers   Nocturnal hypoxemia 05/14/2020   OSTEOARTHRITIS 12/26/2006   takes Diclofenac  daily but has stopped for surgery   Osteoarthritis 12/26/2006   Qualifier: Diagnosis of  By: Nunzio, RN, Dagoberto Caldron    Pain in right knee 11/28/2022   Paraspinal mass 07/16/2014   Pneumonia 11/11/2017   Pneumonia 01/31/2018   Pneumonia 01/16/2023   Pneumonia due to COVID-19 virus 03/08/2020   PONV (postoperative nausea and vomiting)    Restrictive lung disease 04/05/2021   03/22/2021  FVC 2.37 [82%], FEV1 2.07 [91%], F/F 87, TLC 3.56 [64%], DLCO 17.92 [87%] Mild restriction     pfts 10/10/17 s sign airflow obst though the insp loop  is somewhat flattened. Decreased DLCO.     S/P ablation of atrial flutter 07/17/2018   Sensorineural hearing loss (SNHL) of both ears 01/07/2021   Sepsis ([name]) 04/02/2020   Shortness of breath 05/28/2019   Skin cancer    Syncope 08/21/2018   Thoracic aortic aneurysm ([name]) 10/16/2018   Tibialis posterior tendinopathy 04/08/2019   Unspecified disorder of synovium and tendon, right lower leg 04/08/2019   Upper airway cough syndrome 10/27/2017   pfts 10/10/17 s sign airflow obst though the insp loop is somewhat flattened  - Allergy  profile 10/26/2017 >  Eos 0.3 /  IgE  6 RAST neg - rx for gerd 10/26/17 > improved 12/07/2017    Urinary frequency 03/22/2022   Vasomotor rhinitis 01/08/2021   VERTIGO 09/07/2009   Has had Vestibular Rehab consultation   Visual impairment 01/07/2021   Wears dentures    Wears glasses    Wears hearing aid    bilateral   Past Surgical History:  Procedure Laterality Date   ABDOMINAL HYSTERECTOMY     ABLATION OF DYSRHYTHMIC FOCUS  2009    APPENDECTOMY     AUGMENTATION MAMMAPLASTY Right 2013   Subpectoral implant   BIOPSY  05/07/2019   Procedure: BIOPSY;  Surgeon: Wilhelmenia Aloha Raddle., MD;  Location: [facility];  Service: Gastroenterology;;   BREAST BIOPSY  2013   BREAST RECONSTRUCTION  04/14/2011   Procedure: BREAST RECONSTRUCTION;  Surgeon: Alm Sick, MD;  Location: [facility] OR;  Service: Plastics;  Laterality: Right;  Placement of Right Breast Tissue Expander with  use of Flex HD for Breast Reconstruction   BREAST SURGERY     right sm, snbx   CATARACT EXTRACTION W/ INTRAOCULAR LENS  IMPLANT, BILATERAL  2010   bilateral   COLONOSCOPY     COLONOSCOPY     ESOPHAGOGASTRODUODENOSCOPY (EGD) WITH PROPOFOL  N/A 05/07/2019   Procedure: ESOPHAGOGASTRODUODENOSCOPY (EGD) WITH PROPOFOL ;  Surgeon: Wilhelmenia Aloha Raddle., MD;  Location: [facility];  Service: Gastroenterology;  Laterality: N/A;   KNEE SURGERY  2004/2010   arthroscopic/ bilateral knee replacements   LOOP RECORDER IMPLANT     MASTECTOMY Right 2013   PORT-A-CATH REMOVAL N/A 12/07/2016   Procedure: REMOVAL PORT-A-CATH;  Surgeon: Ebbie Cough, MD;  Location[facility];  Service: General;  Laterality: N/A;   PORTACATH PLACEMENT Right 08/11/2014   Procedure: INSERTION PORT-A-CATH WITH ULTRASOUND;  Surgeon: Cough Ebbie, MD;  Location: [facility] OR;  Service: General;  Laterality: Right;   ROTATOR CUFF REPAIR  1999   right   tmi  1998   TONSILLECTOMY     TOTAL KNEE ARTHROPLASTY Bilateral    Family History  Problem Relation Age of Onset   Colon cancer Mother    Cancer Mother        colon   Colon cancer Maternal Aunt    Cancer Maternal Uncle    Prostate cancer Maternal Uncle    Prostate cancer Maternal Uncle    Other Maternal Uncle    Cancer Sister        kidney   Cancer Brother        lymph node   Anesthesia problems Neg Hx    Hypotension Neg Hx    Malignant hyperthermia Neg Hx    Pseudochol deficiency Neg Hx    Breast cancer Neg Hx    Social History    Socioeconomic History   Marital status: Widowed    Spouse name: Not on file   Number of children: 2   Years of education: 12   Highest  education level: High school graduate  Occupational History   Occupation: retired  Tobacco Use   Smoking status: Former    Current packs/day: 0.00    Average packs/day: 0.1 packs/day for 20.0 years (2.0 ttl pk-yrs)    Types: Cigarettes    Start date: 01/24/1958    Quit date: 01/24/1978    Years since quitting: 45.6    Passive exposure: Past   Smokeless tobacco: Never  Vaping Use   Vaping status: Never Used  Substance and Sexual Activity   Alcohol use: No   Drug use: No   Sexual activity: Not Currently    Birth control/protection: Surgical  Other Topics Concern   Not on file  Social History Narrative   Mrs Messerschmidt's two children live beside her, son and daughter-in-law Graziella Connery)   She worked at [employer]   She has a walker, cane, BSC, and lift chair at home.   Social Drivers of Corporate investment banker Strain: Low Risk  (09/18/2023)   Overall Financial Resource Strain (CARDIA)    Difficulty of Paying Living Expenses: Not hard at all  Food Insecurity: No Food Insecurity (09/18/2023)   Hunger Vital Sign    Worried About Running Out of Food in the Last Year: Never true    Ran Out of Food in the Last Year: Never true  Transportation Needs: No Transportation Needs (09/18/2023)   PRAPARE - Administrator, Civil Service (Medical): No    Lack of Transportation (Non-Medical): No  Physical Activity: Sufficiently Active (09/18/2023)   Exercise Vital Sign    Days of Exercise per Week: 6 days    Minutes of Exercise per Session: 30 min  Stress: No Stress Concern Present (09/18/2023)   [employer] of Occupational Health - Occupational Stress Questionnaire    Feeling of Stress: Not at all  Social Connections: Moderately Integrated (09/18/2023)   Social Connection and Isolation Panel    Frequency of Communication with  Friends and Family: More than three times a week    Frequency of Social Gatherings with Friends and Family: Three times a week    Attends Religious Services: More than 4 times per year    Active Member of Clubs or Organizations: Yes    Attends Banker Meetings: More than 4 times per year    Marital Status: Widowed    Tobacco Counseling Counseling given: Not Answered    Clinical Intake:  Pre-visit preparation completed: Yes  Pain : No/denies pain Pain Score: 0-No pain     BMI - recorded: 29.62 Nutritional Status: BMI 25 -29 Overweight Nutritional Risks: None Diabetes: No  No results found for: HGBA1C   How often do you need to have someone help you when you read instructions, pamphlets, or other written materials from your doctor or pharmacy?: 1 - Never  Interpreter Needed?: No  Information entered by :: Elex Mainwaring N. Nastassja Witkop, LPN.   Activities of Daily Living     09/18/2023    1:09 PM 06/05/2023    9:30 AM  In your present state of health, do you have any difficulty performing the following activities:  Hearing? 1 1  Comment Patient wears hearing aids.   Vision? 0 1  Difficulty concentrating or making decisions? 1 0  Comment Patient stated that she has some memory problems.   Walking or climbing stairs? 1   Dressing or bathing? 0   Doing errands, shopping? 1 1  Preparing Food and eating ? N   Using  the Toilet? N   In the past six months, have you accidently leaked urine? N   Do you have problems with loss of bowel control? N   Comment If going out wear for protection only.   Managing your Medications? N   Managing your Finances? N   Housekeeping or managing your Housekeeping? N     Patient Care Team: McDiarmid, Krystal BIRCH, MD as PCP - General (Family Medicine) Verlin Lonni BIRCH, MD as PCP - Cardiology (Cardiology) Neda Jennet LABOR, MD as Consulting Physician (Pulmonary Disease) Vernetta Lonni GRADE, MD as Consulting Physician  (Orthopedic Surgery) Odean Potts, MD as Consulting Physician (Hematology and Oncology) [facility], Donell Cardinal, MD as Consulting Physician (Endocrinology) Cara Elida HERO, NP as Nurse Practitioner (Gastroenterology) Waddell Danelle ORN, MD as Consulting Physician (Cardiology) Ebbie Cough, MD as Consulting Physician (General Surgery) Govind, Kishan, MD as Referring Physician (Ophthalmology)  I have updated your Care Teams any recent Medical Services you may have received from other providers in the past year.     Assessment:   This is a routine wellness examination for Selena [name].  Hearing/Vision screen Hearing Screening - Comments:: Patient wears hearing aids in both ears. Vision Screening - Comments:: Patient wears eyeglasses.  Patient goes to eye specialist Dr. Willia Atkinson.   Goals Addressed             This Visit's Progress    09/18/2023: To maintain my health by staying independent and active around my home.  My children lives next door to me and check up on me constantly.         Depression Screen     09/18/2023    1:24 PM 08/31/2023    3:05 PM 05/25/2023   10:57 AM 04/27/2023   11:07 AM 01/16/2023   11:21 AM 09/12/2022    1:28 PM 06/23/2022   10:57 AM  PHQ 2/9 Scores  PHQ - 2 Score 2 2 3  0 6 0 0  PHQ- 9 Score 9 9 9 6 15  5     Fall Risk     09/18/2023    1:09 PM 08/31/2023    3:05 PM 05/25/2023   10:57 AM 04/27/2023   11:07 AM 01/16/2023   11:21 AM  Fall Risk   Falls in the past year? 1 1 0  0  Number falls in past yr: 0 0 0 0 0  Injury with Fall? 1 1 0 1 0  Risk for fall due to : Impaired balance/gait;Impaired vision    No Fall Risks  Follow up Falls evaluation completed;Education provided    Falls evaluation completed    MEDICARE RISK AT HOME:  Medicare Risk at Home Any stairs in or around the home?: No If so, are there any without handrails?: No Home free of loose throw rugs in walkways, pet beds, electrical cords, etc?: Yes Adequate lighting in  your home to reduce risk of falls?: Yes Life alert?: No Use of a cane, walker or w/c?: Yes (Cane, Wheelchair) Grab bars in the bathroom?: Yes (2-3 bars) Shower chair or bench in shower?: Yes Elevated toilet seat or a handicapped toilet?: Yes  TIMED UP AND GO:  Was the test performed?    Cognitive Function: Patient stated that she has memory loss.    09/18/2023    1:12 PM  MMSE - Mini Mental State Exam  Not completed: Unable to complete      09/23/2021    3:14 PM 01/08/2021   11:00 AM  Montreal Cognitive Assessment  Visuospatial/ Executive (0/5) -- 3  Naming (0/3) -- 2  Attention: Read list of digits (0/2) 0 2  Attention: Read list of letters (0/1) 1 1  Attention: Serial 7 subtraction starting at 100 (0/3) 2 0  Language: Repeat phrase (0/2) 0 0  Language : Fluency (0/1) 0 0  Abstraction (0/2) 1 0  Delayed Recall (0/5) 3 3  Orientation (0/6) 6 6  Total  17  Adjusted Score (based on education)  18      09/12/2022    4:34 PM 11/24/2021    2:22 PM 11/11/2020    3:12 PM 11/11/2020    1:58 PM 11/05/2019    2:27 PM  6CIT Screen  What Year? 0 points 0 points 0 points 0 points 0 points  What month? 0 points 0 points 0 points 0 points 0 points  What time? 0 points 0 points 0 points 0 points   Count back from 20 0 points 0 points 0 points 0 points 0 points  Months in reverse 2 points 0 points 4 points 4 points 4 points  Repeat phrase 0 points 2 points 8 points 8 points 4 points  Total Score 2 points 2 points 12 points 12 points     Immunizations Immunization History  Administered Date(s) Administered   Fluad Quad(high Dose 65+) 09/26/2018   INFLUENZA, HIGH DOSE SEASONAL PF 10/13/2016, 10/12/2017   Influenza Split 10/20/2011   Influenza Whole 11/07/2006   Influenza-Unspecified 09/07/2013, 10/15/2015, 09/26/2018, 10/01/2019   Moderna Sars-Covid-2 Vaccination 02/04/2019, 03/04/2019, 12/24/2019   Pfizer Covid-19 Vaccine Bivalent Booster 63yrs & up 01/07/2021    Pfizer(Comirnaty)Fall Seasonal Vaccine 12 years and older 06/23/2022   Pneumococcal Conjugate-13 08/26/2013   Pneumococcal Polysaccharide-23 10/29/2007, 08/28/2017   Td 05/27/2008   Tdap 05/30/2018   Zoster, Live 10/29/2007    Screening Tests Health Maintenance  Topic Date Due   Zoster Vaccines- Shingrix (1 of 2) 10/14/1954   COVID-19 Vaccine (6 - 2024-25 season) 09/28/2023 (Originally 09/25/2022)   Medicare Annual Wellness (AWV)  09/17/2024   DTaP/Tdap/Td (3 - Td or Tdap) 05/29/2028   Pneumococcal Vaccine: 50+ Years  Completed   DEXA SCAN  Completed   HPV VACCINES  Aged Out   Meningococcal B Vaccine  Aged Out   Colonoscopy  Discontinued    Health Maintenance  Health Maintenance Due  Topic Date Due   Zoster Vaccines- Shingrix (1 of 2) 10/14/1954   Health Maintenance Items Addressed: Yes, Patient aware to get Flu and Covid vaccine this fall.  Additional Screening:  Vision Screening: Recommended annual ophthalmology exams for early detection of glaucoma and other disorders of the eye. Would you like a referral to an eye doctor? No    Dental Screening: Recommended annual dental exams for proper oral hygiene  Community Resource Referral / Chronic Care Management: CRR required this visit?  No   CCM required this visit?  No   Plan:    I have personally reviewed and noted the following in the patient's chart:   Medical and social history Use of alcohol, tobacco or illicit drugs  Current medications and supplements including opioid prescriptions. Patient is not currently taking opioid prescriptions. Functional ability and status Nutritional status Physical activity Advanced directives List of other physicians Hospitalizations, surgeries, and ER visits in previous 12 months Vitals Screenings to include cognitive, depression, and falls Referrals and appointments  In addition, I have reviewed and discussed with patient certain preventive protocols, quality metrics,  and best practice recommendations. A written personalized care plan  for preventive services as well as general preventive health recommendations were provided to patient.   Selena LOISE Fuller, LPN   [id number]   After Visit Summary: (MyChart) Due to this being a telephonic visit, the after visit summary with patients personalized plan was offered to patient via MyChart   Notes: Nothing significant to report at this time.

## 2023-09-18 NOTE — Patient Instructions (Signed)
 Ms. Ayars , Thank you for taking time out of your busy schedule to complete your Annual Wellness Visit with me. I enjoyed our conversation and look forward to speaking with you again next year. I, as well as your care team,  appreciate your ongoing commitment to your health goals. Please review the following plan we discussed and let me know if I can assist you in the future. Your Game plan/ To Do List    Referrals: If you haven't heard from the office you've been referred to, please reach out to them at the phone provided.   Follow up Visits: We will see or speak with you next year for your Next Medicare AWV with our clinical staff Have you seen your provider in the last 6 months (3 months if uncontrolled diabetes)? Yes  Clinician Recommendations:  Aim for 30 minutes of exercise or brisk walking, 6-8 glasses of water, and 5 servings of fruits and vegetables each day.       This is a list of the screenings recommended for you:  Health Maintenance  Topic Date Due   Zoster (Shingles) Vaccine (1 of 2) 10/14/1954   COVID-19 Vaccine (6 - 2024-25 season) 09/28/2023*   Medicare Annual Wellness Visit  09/17/2024   DTaP/Tdap/Td vaccine (3 - Td or Tdap) 05/29/2028   Pneumococcal Vaccine for age over 33  Completed   DEXA scan (bone density measurement)  Completed   HPV Vaccine  Aged Out   Meningitis B Vaccine  Aged Out   Colon Cancer Screening  Discontinued  *Topic was postponed. The date shown is not the original due date.    Advanced directives: (In Chart) A copy of your advanced directives are scanned into your chart should your provider ever need it. Advance Care Planning is important because it:  [x]  Makes sure you receive the medical care that is consistent with your values, goals, and preferences  [x]  It provides guidance to your family and loved ones and reduces their decisional burden about whether or not they are making the right decisions based on your wishes.  Follow the link  provided in your after visit summary or read over the paperwork we have mailed to you to help you started getting your Advance Directives in place. If you need assistance in completing these, please reach out to us  so that we can help you!  See attachments for Preventive Care and Fall Prevention Tips.

## 2023-09-26 ENCOUNTER — Ambulatory Visit (HOSPITAL_BASED_OUTPATIENT_CLINIC_OR_DEPARTMENT_OTHER)

## 2023-09-26 DIAGNOSIS — R053 Chronic cough: Secondary | ICD-10-CM | POA: Diagnosis not present

## 2023-09-26 DIAGNOSIS — R062 Wheezing: Secondary | ICD-10-CM

## 2023-09-26 DIAGNOSIS — J849 Interstitial pulmonary disease, unspecified: Secondary | ICD-10-CM | POA: Diagnosis not present

## 2023-09-26 LAB — ECHOCARDIOGRAM COMPLETE
AV Vena cont: 0.29 cm
Area-P 1/2: 4.71 cm2
S' Lateral: 2.98 cm

## 2023-10-17 ENCOUNTER — Telehealth: Payer: Self-pay | Admitting: Pharmacist

## 2023-10-17 NOTE — Telephone Encounter (Signed)
 Patient contacted for follow-up of need for Eliquis  (apixaban )   Since last contact patient reports she only has a few pills remaining.   Current Medications include: Eliquis  (apixaban ) 5mg  BID Patient denies any significant medication related side effects. Medication Samples have been provided to the patient.  Qty: 6 weeks  LOT: [id number]  Exp.Date: 04/23/2024 Dosing instructions: 1 twice daily  The patient has been instructed regarding the correct time, dose, and frequency of taking this medication, including desired effects and most common side effects.   Selena [name] 4:37 PM 10/17/2023  Total time with patient call and documentation of interaction: 9 minutes.

## 2023-10-19 NOTE — Telephone Encounter (Signed)
 Reviewed and agree with Dr Rennis plan.

## 2023-10-29 NOTE — Progress Notes (Signed)
 Our radiologists are concerned that thte fibrosis worse over 2  years and possibly worse even in 1 year . There are other findings in CT as well. So, please do keep your Oct 2025 appt

## 2023-10-31 ENCOUNTER — Telehealth: Payer: Self-pay | Admitting: Family Medicine

## 2023-10-31 NOTE — Telephone Encounter (Signed)
 Pt daughter dropped off prescription profile. Placed in Dr McDiarmid box

## 2023-11-01 NOTE — Telephone Encounter (Signed)
 Daughter returns call to nurse line.   She reports her mother advised her she was told to bring in medication information.   She reports the information was to support medication assistance for Eliquis .   Advised will forward to PCP and pharmacy team.

## 2023-11-01 NOTE — Telephone Encounter (Signed)
 Called and left a voice mail for the patients daughter to give us  a call back when she can.

## 2023-11-01 NOTE — Telephone Encounter (Signed)
 Prescriptions with refill profiles documentation placed in prescription Team box in front office.

## 2023-11-01 NOTE — Telephone Encounter (Signed)
 Please contact daughter to find out if this information was requested by us 

## 2023-11-05 NOTE — Progress Notes (Signed)
 Non- lung related abnormalities on echo. Wil l discuss 11/07/23 Xxxx . Left atrial size was severely dilated.  4. Right atrial size was severely dilated.  5. The mitral valve is normal in structure. Mild to moderate mitral valve regurgitation. No evidence of mitral stenosis.  6. re. Aortic valve regurgitation is mild. Aortic valve sclerosis is present, with no evidence of aortic valve stenosis.  7. There is moderate dilatation of the ascending aorta, measuring 41 mm.

## 2023-11-07 ENCOUNTER — Ambulatory Visit (INDEPENDENT_AMBULATORY_CARE_PROVIDER_SITE_OTHER): Admitting: Internal Medicine

## 2023-11-07 ENCOUNTER — Encounter: Payer: Self-pay | Admitting: Internal Medicine

## 2023-11-07 ENCOUNTER — Telehealth: Payer: Self-pay | Admitting: Internal Medicine

## 2023-11-07 VITALS — BP 110/64 | HR 90 | Temp 98.4°F | Ht 66.0 in | Wt 177.0 lb

## 2023-11-07 DIAGNOSIS — G4721 Circadian rhythm sleep disorder, delayed sleep phase type: Secondary | ICD-10-CM | POA: Diagnosis not present

## 2023-11-07 DIAGNOSIS — R0609 Other forms of dyspnea: Secondary | ICD-10-CM

## 2023-11-07 DIAGNOSIS — Z5181 Encounter for therapeutic drug level monitoring: Secondary | ICD-10-CM

## 2023-11-07 DIAGNOSIS — R0982 Postnasal drip: Secondary | ICD-10-CM

## 2023-11-07 DIAGNOSIS — J439 Emphysema, unspecified: Secondary | ICD-10-CM

## 2023-11-07 DIAGNOSIS — J84112 Idiopathic pulmonary fibrosis: Secondary | ICD-10-CM

## 2023-11-07 DIAGNOSIS — J849 Interstitial pulmonary disease, unspecified: Secondary | ICD-10-CM

## 2023-11-07 MED ORDER — AZELASTINE HCL 0.1 % NA SOLN: 2.0000 | Freq: Two times a day (BID) | NASAL | Status: AC

## 2023-11-07 NOTE — Patient Instructions (Addendum)
 ICD-10-CM   1. ILD (interstitial lung disease) ([name])  J84.9 Pulmonary function test    Pulse oximetry, overnight    2. IPF (idiopathic pulmonary fibrosis) ([name])  J84.112 Pulmonary function test    Pulse oximetry, overnight    3. Encounter for therapeutic drug monitoring  Z51.81 Pulmonary function test    Pulse oximetry, overnight    4. Pulmonary emphysema ([name])  J43.9 Pulmonary function test    Pulse oximetry, overnight    5. Circadian rhythm sleep disorder, delayed sleep phase type  G47.21 Pulmonary function test    Pulse oximetry, overnight    6. Post-nasal drip  R09.82        #Interstitial lung disease   - Provisionally: This is IPF.  It is progressive.  Plan - Start pirfenidone low-dose protocol  - Sending message to pharmacy  - If this medicine causes side effects then we can try Nerandomilast -Do not recommend lung biopsy because of risk - Test overnight pulse oximetry on room air with the CPAP on - Do spirometry and DLCO first available in 6-8 weeks  #Emphysema  PLAN - Continue Breo but do it with you improved technique that we taught you  #Sleep apnea  Plan  - Sending message to Dr. MALVA that your CPAP machine will not be supplied by the DME company anymore after 3 months  #Shortness of breath  - This is because of all of the above issues   PLAN -  - you do not appear to have mobility to do pulmonary rehabilitation but we can assess this in the future - If oxygen does not help then we will check BNP and if this is high consider right heart cath  # Postnasal drip  Plan - Start Astelin  nasal spray 2 squirts into each nostril twice daily  Follow-up - 6-8 weeks with nurse practitioner to discuss pirfenidone uptake

## 2023-11-07 NOTE — Telephone Encounter (Signed)
 Wale  Gaile Georgina Cumming  Sauys cPAP suppky will run out in 3 months. Not sure what that means. Passing it on  THanks    SIGNATURE    Dr. Dorethia Cave, M.D., F.C.C.P,  Pulmonary and Critical Care Medicine Staff Physician, [facility] Director - Interstitial Lung Disease  Program  Pulmonary Fibrosis [facility] [place], [place], [address]   Pager: [phone], If no answer  -> Check AMION or Try [phone] Telephone (clinical office): [phone] Telephone (research): [phone]  4:21 PM 11/07/2023

## 2023-11-07 NOTE — Progress Notes (Signed)
 Chief complaint: Consult for dyspnea IOV 09/14/2021  HPI: 88 year old with history of OSA on CPAP, CHF, breast cancer, hypertension, non-Hodgkin's lymphoma, chronic A-fib on Eliquis  Referred by Dr. Neda for evaluation of abnormal CT scan She has history of chronic intermittent dyspnea on exertion.  No symptoms at rest.  History notable for COVID-19 pneumonia requiring hospitalization February 2022.  Had a repeat hospitalization in March 2022 with severe sepsis due to E. coli bacteremia and UTI.  She has history of mild OSA on CPAP and follows with Dr. 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  No feather pillows or comforter ILD questionnaire 03/23/2020-negative Smoking history: Remote smoking history.  Quit in the 1980s Travel history: No significant travel history Relevant family history: No family history of lung disease  Interim history: Here for review of follow-up CT Continues to have significant nasal congestion with discharge and cough which is affecting her quality of life  OV 08/24/2023 transfer of care to Dr. Geronimo in the ILD center -   Subjective:  Patient ID: Selena [name], female , DOB: 11/10/1935 , age 6 y.o. , MRN: [id number] , ADDRESS: [address] [place] [address] PCP McDiarmid, Krystal BIRCH, MD Patient Care Team: McDiarmid, Krystal BIRCH, MD as PCP - General (Family Medicine) Verlin Lonni BIRCH, MD as PCP - Cardiology (Cardiology) Neda Jennet LABOR, MD as Consulting Physician (Pulmonary Disease) Vernetta Lonni GRADE, MD as Consulting Physician (Orthopedic Surgery) Odean Potts, MD as Consulting Physician (Hematology and Oncology) [facility], Donell Cardinal, MD as Consulting Physician (Endocrinology) Cara Elida HERO, NP as Nurse Practitioner (Gastroenterology) Waddell Danelle ORN, MD as Consulting Physician (Cardiology) Ebbie Cough, MD as Consulting Physician  (General Surgery) Josh Server, MD as Referring Physician (Ophthalmology)  This Provider for this visit: Treatment Team:  Attending Provider: Geronimo Amel, MD    08/24/2023 -   Chief Complaint  Patient presents with   Consult    ILD Pt experiences SOB with exertion. Pt tries to walk 1 mile a day but has been unable to recently due to the heat.       HPI Selena [name] 88 y.o. -has sleep apnea and follows with Dr. MALVA.  She has other issues such as hypertension hyperlipidemia A-fib remote breast cancer status post with mastectomy and reconstruction, acid reflux.  In February 2022 had COVID and hospitalized then in March 2022 had E. coli sepsis and hospitalized.  In August 2023 did see Dr. Theophilus for ILD evaluation.  It appeared that the CT scan was stabilized and she was asked to come back in 6 months but she had not followed up.  Review of the scans indicate that some 6 months prior to the COVID in February 2022 there was ILD onset [personal visualization and impression].  This was on the scan in April 2021.  There is also reports of this is the September 2020 scan.  Definitely after COVID it is more prominent.  The final high-resolution CT chest was in August 2023 with groundglass opacities craniocaudal gradient reticulation but no honeycombing.  Pattern of post-COVID or NSIP and is felt to be stable compared to the COVID admission but in my opinion more progressive compared to pre-COVID.  Against his backdrop she got admitted for 3 days mid May 2025 with respiratory failure.  She was presented with pain.  CT chest demonstrated multifocal pneumonia and underlying known ILD.  She is treated with antibiotics and discharge.  Since discharge she is better from a pneumonia perspective but she tells me that she is having worsening dyspnea on exertion for the last 1 month and also some wheezing.  She says she was discharged with [facility] but it is not helping her.  CMA checked a technique of  taking the inhaler and it was faulty and she was retrained.  She is really frustrated with the symptoms  Exercise hypoxemia test shows she is not desaturating here in the office.  Some years ago limited autoimmune panel was negative. .       OV 11/07/2023  Subjective:  Patient ID: Selena [name], female , DOB: [birth year] , age 35 y.o. , MRN: [id number] , ADDRESS: [address] [place] [address] PCP McDiarmid, Krystal BIRCH, MD Patient Care Team: McDiarmid, Krystal BIRCH, MD as PCP - General (Family Medicine) Verlin Lonni BIRCH, MD as PCP - Cardiology (Cardiology) Neda Jennet LABOR, MD as Consulting Physician (Pulmonary Disease) Vernetta Lonni GRADE, MD as Consulting Physician (Orthopedic Surgery) Odean Potts, MD as Consulting Physician (Hematology and Oncology) [facility], Donell Cardinal, MD as Consulting Physician (Endocrinology) Cara Elida HERO, NP as Nurse Practitioner (Gastroenterology) Waddell Danelle ORN, MD as Consulting Physician (Cardiology) Ebbie Cough, MD as Consulting Physician (General Surgery) Josh Server, MD as Referring Physician (Ophthalmology)  This Provider for this visit: Treatment Team:  Attending Provider: Geronimo Amel, MD    11/07/2023 -   Chief Complaint  Patient presents with   Interstitial Lung Disease    Pt states since LOV breathing has been slow  SOB occurs with exertion and w/o ( laying down pt will be SOB) Prod cough phlegm (white, yellow)   #ILD workup in progress  HPI Selena [name] 88 y.o. -returns for follow-up.  Presents with her daughter.  Here to discuss workup in progress.  Symptoms are not slowly progressive over time but compared to August 24, 2023 visit symptom score is the same.  Her exercise hypoxemia test is also stable.  But she feels she is progressing.  She is frustrated by her symptoms.  She did serology and this is negative.  She could not get pulmonary function test done.  We do not know why.   She did have a high-resolution CT chest and I personally visualized this.  This is either a NSIP indeterminate/alternative or probable UIP.  Nevertheless it is progressive.  I discussed with her and her daughter about antifibrotic of choice.  Discussed the new medication Nerandomilast which can cause diarrhea and 40% of patients with 1% quit rate because of the diarrhea.  She does not want diarrhea as a side effect.  Then discussed the alternative of pirfenidone low-dose protocol.  Nausea, vomiting, low appetite fatigue and weight loss of the more common side effects.  She is okay with this.  Will start this with a provisional diagnose of IPF  Esbriet/Pirfenidone requires intensive drug monitoring due to high concerns for Adverse effects of , including  Drug Induced Liver Injury, significant GI side effects that include but not limited to Diarrhea, Nausea, Vomiting,  and other system side effects that include Fatigue, headaches, weight loss and other side effects such as skin rash. These will be monitored with  blood work such as LFT initially once a month for 6 months and then quarterly   Emphysema: She is on Breo and wants to continue it  Shortness of breath: She is complaining of significant class III shortness of breath but does not desaturate on exertion.  Her mobility prevents  from doing rehab.  Will text to overnight pulse oximetry on room air with her CPAP on [she is sleep apnea]  Sleep apnea: She tells me that the DME company is going to run out of the CPAP machine.  I need to send a message to Dr. MALVA  Also complaining of continuous chronic postnasal drip: Will do inhaler Astelin .  Of the history.  SYMPTOM SCALE - ILD 08/24/2023  Current weight   O2 use ra  Shortness of Breath 0 -> 5 scale with 5 being worst (score 6 If unable to do)  At rest 2  Simple tasks - showers, clothes change, eating, shaving 5  Household (dishes, doing bed, laundry) 2  Shopping 2  Walking level at own pace 5   Walking up Stairs 5  Total (30-36) Dyspnea Score 21  How bad is your cough? 3  How bad is your fatigue 4  How bad is appetiee 1  How bad is nausea 0  How bad is vomiting?  0  How bad is diarrhea? 4  How bad is anxiety? 3  How bad is depression 3  Any chronic pain - if so where and how bad no       SIT STAND TEST - goal 15 times   08/24/2023  11/07/2023   O2 used ra ra  PRobe - finter or forehead finger finger  Number sit and stand completed - goal 15 15 1  min and 54 sec  Time taken to complete Slow 1 min and 25 sec 15 times  Resting Pulse Ox/HR/Dyspnea  96% and 82/min and dyspnea of 0/10  97% and HR 85 and score 6  Peak measures 97 % and 110/min and dyspnea of 8.5/10 98%and HR 109 and score 10  Final Pulse Ox/HR 95% and 85/min and dyspnea of 6/10 98% and HR 97 and score 9  Desaturated </= 88% no   Desaturated <= 3% points no   Got Tachycardic >/= 90/min yes   Miscellaneous comments Very dyspnec Extremely dyspneic    CT Chest data from date:   - personally visualized and independently interpreted : yes - my findings are: as below arrative & Impression  CLINICAL DATA:  Diffuse/interstitial lung disease. Breast cancer, former smoker.   EXAM: CT CHEST WITHOUT CONTRAST   TECHNIQUE: Multidetector CT imaging of the chest was performed following the standard protocol without intravenous contrast. High resolution imaging of the lungs, as well as inspiratory and expiratory imaging, was performed.   RADIATION DOSE REDUCTION: This exam was performed according to the departmental dose-optimization program which includes automated exposure control, adjustment of the mA and/or kV according to patient size and/or use of iterative reconstruction technique.   COMPARISON:  PET 06/30/2023, CT chest 06/05/2023, 06/27/2022, 12/09/2021.   FINDINGS: Cardiovascular: Atherosclerotic calcification of the aorta, aortic valve and coronary arteries. Ascending aorta measures 4.1  cm, stable. Enlarged pulmonic trunk and heart. No pericardial effusion.   Mediastinum/Nodes: Asymmetrically enlarged, heterogeneous and nodular left thyroid . Mediastinal lymph nodes measure up to 10 mm in the low right paratracheal station, unchanged. Hilar regions are difficult to definitively evaluate without IV contrast. No axillary adenopathy. Air in the esophagus can be seen with dysmotility.   Lungs/Pleura: Biapical pleuroparenchymal scarring. Centrilobular and paraseptal emphysema. Peripheral and basilar predominant coarsened ground-glass, subpleural reticulation and traction bronchiectasis/bronchiolectasis, similar to minimally progressive from 06/27/2022 and progressive from 12/09/2021. No definitive honeycombing. Central 9 mm right upper lobe nodule (4/55), unchanged from 12/09/2021. Per Fleischner Society guidelines, no follow-up is necessary.  No pleural fluid. Airway is unremarkable. Minimal air trapping.   Upper Abdomen: Small low-attenuation lesion in the inferior right hepatic lobe, too small to characterize. Small hiatal hernia. 8 mm peripherally calcified splenic artery aneurysm. Visualized portions of the liver, gallbladder, adrenal glands, kidneys, spleen, pancreas, stomach and bowel are otherwise grossly unremarkable. No upper abdominal adenopathy.   Musculoskeletal: Degenerative changes in the spine.   IMPRESSION: 1. Pulmonary parenchymal pattern of interstitial lung disease, as detailed above, similar to minimally progressive from 06/27/2022 and clearly progressive from 12/09/2021. Findings may be due to usual interstitial pneumonitis. The possibility of fibrotic hypersensitivity pneumonitis is also considered. Findings are categorized as probable UIP per consensus guidelines: Diagnosis of Idiopathic Pulmonary Fibrosis: An Official ATS/ERS/JRS/ALAT Clinical Practice Guideline. Am JINNY Honey Crit Care Med Vol 198, Iss 5, [id number], Sep 24 2016. 2. 4.1 cm  ascending aortic aneurysm, stable. Recommend annual imaging followup by CTA or MRA. This recommendation follows 2010 ACCF/AHA/AATS/ACR/ASA/SCA/SCAI/SIR/STS/SVM Guidelines for the Diagnosis and Management of Patients with Thoracic Aortic Disease. Circulation. 2010; 121: Z733-z630. Aortic aneurysm NOS (ICD10-I71.9). 3. Asymmetrically enlarged, heterogeneous and nodular left thyroid , previously evaluated by ultrasound on 06/22/2021, at which time a 1 year follow-up is recommended. Recommend thyroid  ultrasound, as clinically indicated. (Ref: J Am Coll Radiol. 2015 Feb;12(2): 143-50). 4. 8 mm calcified splenic artery aneurysm. 5. Aortic atherosclerosis (ICD10-I70.0). Coronary artery calcification. 6. Enlarged pulmonic trunk, indicative of pulmonary arterial hypertension. 7.  Emphysema (ICD10-J43.9).     Electronically Signed   By: Newell Eke M.D.   On: 09/13/2023 10:58    PFT     Latest Ref Rng & Units 03/22/2021    9:46 AM 10/10/2017   11:50 AM  PFT Results  FVC-Pre L 2.41  2.44   FVC-Predicted Pre % 84  81   FVC-Post L 2.37  2.40   FVC-Predicted Post % 82  80   Pre FEV1/FVC % % 81  81   Post FEV1/FCV % % 87  87   FEV1-Pre L 1.94  1.98   FEV1-Predicted Pre % 91  88   FEV1-Post L 2.07  2.09   DLCO uncorrected ml/min/mmHg 17.92  17.08   DLCO UNC% % 87  60   DLCO corrected ml/min/mmHg 17.92    DLCO COR %Predicted % 87    DLVA Predicted % 139  83   TLC L 3.56  4.43   TLC % Predicted % 64  80   RV % Predicted % 50  80    ECHO 09/26/23   Left atrial size was severely dilated.  4. Right atrial size was severely dilated.  5. The mitral valve is normal in structure. Mild to moderate mitral valve regurgitation. No evidence of mitral stenosis.  6. re. Aortic valve regurgitation is mild. Aortic valve sclerosis is present, with no evidence of aortic valve stenosis.  7. There is moderate dilatation of the ascending aorta, measuring 41 mm.    Latest Reference Range & Units  08/24/23 16:18  Aspergillus Fumigatus NEGATIVE  NEGATIVE  Pigeon Serum NEGATIVE  NEGATIVE  Anti Nuclear Antibody (ANA) NEGATIVE  NEGATIVE  Cyclic Citrullin Peptide Ab UNITS <16  RA Latex Turbid. <14 IU/mL <10  Ribonucleic Protein(ENA) Antibody, IgG <1.0 NEG AI <1.0 NEG  SSA (Ro) (ENA) Antibody, IgG <1.0 NEG AI <1.0 NEG  SSB (La) (ENA) Antibody, IgG <1.0 NEG AI <1.0 NEG  Scleroderma (Scl-70) (ENA) Antibody, IgG <1.0 NEG AI <1.0 NEG       has a past medical history of Acute  gastric ulcer with hemorrhage, Acute on chronic diastolic congestive heart failure ([name]) (10/16/2018), AKI (acute kidney injury), Anemia, Aneurysm (arteriovenous) of coronary vessels, Ankle edema, bilateral (01/08/2021), Arthralgia of right knee (11/28/2022), Atrial fibrillation ([name]) (01/01/2007), Atrial fibrillation, permanent ([name]) (01/01/2007), BACK PAIN, UPPER (07/09/2008), Breast cancer of upper-outer quadrant of right female breast ([name]) (03/21/2011), Breast cancer, stage 1 ([name]), Bruises easily, Cataract, Complication of anesthesia, COVID-19 virus infection (03/08/2020), Disorder of tendon of posterior tibial muscle (04/08/2019), Diverticulitis, DIVERTICULOSIS, COLON (12/26/2006), Essential hypertension (10/27/2017), Follicular 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 ([name]) (07/31/2014), Follicular non-Hodgkin's lymphoma ([name]), Frozen shoulder, Left (01/08/2021), Full dentures (01/08/2021), Goiter, Gustatory rhinitis (01/07/2021), Hemorrhoids (04/08/2021), History of atrioventricular nodal ablation (10/16/2018), History of cardiac radiofrequency ablation (01/25/2007), colonoscopy (2009), Incontinence of feces (04/08/2021), Influenza (12/18/2020), Insomnia (09/10/2014), Interstitial lung disease ([name]), LIVER FUNCTION TESTS, ABNORMAL, HX OF (12/26/2007), Macular degeneration, bilateral (01/08/2021), Memory loss (05/16/2007), Mild cognitive impairment (09/23/2021), Multifocal pneumonia (06/05/2023), Multiple gastric ulcers  (2021), Nocturnal hypoxemia (05/14/2020), OSTEOARTHRITIS (12/26/2006), Osteoarthritis (12/26/2006), Pain in right knee (11/28/2022), Paraspinal mass (07/16/2014), Pneumonia (11/11/2017), Pneumonia (01/31/2018), Pneumonia (01/16/2023), Pneumonia due to COVID-19 virus (03/08/2020), PONV (postoperative nausea and vomiting), Restrictive lung disease (04/05/2021), S/P ablation of atrial flutter (07/17/2018), Sensorineural hearing loss (SNHL) of both ears (01/07/2021), Sepsis ([name]) (04/02/2020), Shortness of breath (05/28/2019), Skin cancer, Syncope (08/21/2018), Thoracic aortic aneurysm (10/16/2018), Tibialis posterior tendinopathy (04/08/2019), Unspecified disorder of synovium and tendon, right lower leg (04/08/2019), Upper airway cough syndrome (10/27/2017), Urinary frequency (03/22/2022), Vasomotor rhinitis (01/08/2021), VERTIGO (09/07/2009), Visual impairment (01/07/2021), Wears dentures, Wears glasses, and Wears hearing aid.   reports that she quit smoking about 45 years ago. Her smoking use included cigarettes. She started smoking about 65 years ago. She has a 2 pack-year smoking history. She has been exposed to tobacco smoke. She has never used smokeless tobacco.  Past Surgical History:  Procedure Laterality Date   ABDOMINAL HYSTERECTOMY     ABLATION OF DYSRHYTHMIC FOCUS  2009   APPENDECTOMY     AUGMENTATION MAMMAPLASTY Right 2013   Subpectoral implant   BIOPSY  05/07/2019   Procedure: BIOPSY;  Surgeon: Wilhelmenia Aloha Raddle., MD;  Location: [facility];  Service: Gastroenterology;;   BREAST BIOPSY  2013   BREAST RECONSTRUCTION  04/14/2011   Procedure: BREAST RECONSTRUCTION;  Surgeon: Alm Sick, MD;  [facility] OR;  Service: Plastics;  Laterality: Right;  Placement of Right Breast Tissue Expander with  use of Flex HD for Breast Reconstruction   BREAST SURGERY     right sm, snbx   CATARACT EXTRACTION W/ INTRAOCULAR LENS  IMPLANT, BILATERAL  2010   bilateral   COLONOSCOPY     COLONOSCOPY      ESOPHAGOGASTRODUODENOSCOPY (EGD) WITH PROPOFOL  N/A 05/07/2019   Procedure: ESOPHAGOGASTRODUODENOSCOPY (EGD) WITH PROPOFOL ;  Surgeon: Wilhelmenia Aloha Raddle., MD;  Location: [facility];  Service: Gastroenterology;  Laterality: N/A;   KNEE SURGERY  2004/2010   arthroscopic/ bilateral knee replacements   LOOP RECORDER IMPLANT     MASTECTOMY Right 2013   PORT-A-CATH REMOVAL N/A 12/07/2016   Procedure: REMOVAL PORT-A-CATH;  Surgeon: Ebbie Cough, MD;  Location: [facility] OR;  Service: General;  Laterality: N/A;   PORTACATH PLACEMENT Right 08/11/2014   Procedure: INSERTION PORT-A-CATH WITH ULTRASOUND;  Surgeon: Cough Ebbie, MD;  Location: [facility] OR;  Service: General;  Laterality: Right;   ROTATOR CUFF REPAIR  1999   right   tmi  1998   TONSILLECTOMY     TOTAL KNEE ARTHROPLASTY Bilateral     Allergies  Allergen Reactions  Codeine  Other (See Comments)    Hallucinations.  Pt states she can take this now that she can no longer see due to Macular degeneration  Other Reaction(s): Other  Hallucinations.  Pt states she can take this now that she can no longer see due to Macular degeneration     Hallucinations   Codeine  Phosphate Other (See Comments)    Hallucinations   Adhesive [Tape] Other (See Comments)    Electrodes for EKG- skin blistering, erythema    Immunization History  Administered Date(s) Administered   Fluad Quad(high Dose 65+) 09/26/2018   INFLUENZA, HIGH DOSE SEASONAL PF 10/13/2016, 10/12/2017   Influenza Split 10/20/2011   Influenza Whole 11/07/2006   Influenza-Unspecified 09/07/2013, 10/15/2015, 09/26/2018, 10/01/2019   Moderna Sars-Covid-2 Vaccination 02/04/2019, 03/04/2019, 12/24/2019   Pfizer Covid-19 Vaccine Bivalent Booster 19yrs & up 01/07/2021   Pfizer(Comirnaty)Fall Seasonal Vaccine 12 years and older 06/23/2022   Pneumococcal Conjugate-13 08/26/2013   Pneumococcal Polysaccharide-23 10/29/2007, 08/28/2017   Td 05/27/2008   Tdap 05/30/2018   Zoster,  Live 10/29/2007    Family History  Problem Relation Age of Onset   Colon cancer Mother    Cancer Mother        colon   Colon cancer Maternal Aunt    Cancer Maternal Uncle    Prostate cancer Maternal Uncle    Prostate cancer Maternal Uncle    Other Maternal Uncle    Cancer Sister        kidney   Cancer Brother        lymph node   Anesthesia problems Neg Hx    Hypotension Neg Hx    Malignant hyperthermia Neg Hx    Pseudochol deficiency Neg Hx    Breast cancer Neg Hx      Current Outpatient Medications:    amLODipine  (NORVASC ) 2.5 MG tablet, Take 1 tablet (2.5 mg total) by mouth at bedtime., Disp: 90 tablet, Rfl: 3   apixaban  (ELIQUIS ) 5 MG TABS tablet, TAKE 1 TABLET(5 MG) BY MOUTH TWICE DAILY, Disp: 180 tablet, Rfl: 1   doxepin  (SINEQUAN ) 10 MG/ML solution, SMARTSIG:1 Milliliter(s) By Mouth Every Night PRN, Disp: , Rfl:    famotidine  (PEPCID ) 20 MG tablet, TAKE 1 TABLET(20 MG) BY MOUTH AT BEDTIME, Disp: 90 tablet, Rfl: 1   fluticasone  furoate-vilanterol (BREO ELLIPTA ) 200-25 MCG/ACT AEPB, Inhale 1 puff into the lungs daily., Disp: 60 each, Rfl: 5   montelukast  (SINGULAIR ) 10 MG tablet, TAKE 1 TABLET(10 MG) BY MOUTH AT BEDTIME, Disp: 90 tablet, Rfl: 1   pantoprazole  (PROTONIX ) 40 MG tablet, Take 1 tablet (40 mg total) by mouth 2 (two) times daily., Disp: 180 tablet, Rfl: 3   rosuvastatin  (CRESTOR ) 10 MG tablet, Take 1 tablet (10 mg total) by mouth daily., Disp: 90 tablet, Rfl: 3   albuterol  (VENTOLIN  HFA) 108 (90 Base) MCG/ACT inhaler, INHALE 2 PUFFS INTO THE LUNGS EVERY 4 HOURS AS NEEDED FOR WHEEZING OR SHORTNESS OF BREATH (Patient not taking: Reported on 11/07/2023), Disp: 18 g, Rfl: 5   azelastine  (ASTELIN ) 0.1 % nasal spray, PALCE 2 SPRAYS INTO BOTH NOSTRILS ONCE DAILY FOR 1 DOSE. (Patient not taking: Reported on 11/07/2023), Disp: 30 mL, Rfl: 3   ipratropium (ATROVENT ) 0.06 % nasal spray, Place 2 sprays into both nostrils 4 (four) times daily. (Patient not taking: Reported on  11/07/2023), Disp: 15 mL, Rfl: 12   meclizine  (ANTIVERT ) 25 MG tablet, Take 1 tablet (25 mg total) by mouth daily as needed for dizziness. (Patient not taking: Reported on 11/07/2023), Disp: 30  tablet, Rfl: 0  Current Facility-Administered Medications:    azelastine  (ASTELIN ) 0.1 % nasal spray 2 spray, 2 spray, Each Nare, BID,       Objective:   Vitals:   11/07/23 1532  BP: 110/64  Pulse: 90  Temp: 98.4 F (36.9 C)  TempSrc: Oral  SpO2: 98%  Weight: 177 lb (80.3 kg)  Height: 5' 6 (1.676 m)    Estimated body mass index is 28.57 kg/m as calculated from the following:   Height as of this encounter: 5' 6 (1.676 m).   Weight as of this encounter: 177 lb (80.3 kg).  @WEIGHTCHANGE @  [employer]   11/07/23 1532  Weight: 177 lb (80.3 kg)     Physical Exam   General: No distress.  O2 at rest: no Cane present: YES Sitting in wheel chair: no Frail: YES Obese: no Neuro: Alert and Oriented x 3. GCS 15. Speech normal Psych: Pleasant Resp:  Barrel Chest - no.  Wheeze - no, Crackles - YES, No overt respiratory distress CVS: Normal heart sounds. Murmurs - no Ext: Stigmata of Connective Tissue Disease - no HEENT: Normal upper airway. PEERL +. No post nasal drip        Assessment/     Assessment & Plan ILD (interstitial lung disease) ([name])  IPF (idiopathic pulmonary fibrosis) ([name])  Encounter for therapeutic drug monitoring  Pulmonary emphysema ([name])  Circadian rhythm sleep disorder, delayed sleep phase type  Post-nasal drip  DOE (dyspnea on exertion)    PLAN Patient Instructions     ICD-10-CM   1. ILD (interstitial lung disease) ([name])  J84.9 Pulmonary function test    Pulse oximetry, overnight    2. IPF (idiopathic pulmonary fibrosis) ([name])  J84.112 Pulmonary function test    Pulse oximetry, overnight    3. Encounter for therapeutic drug monitoring  Z51.81 Pulmonary function test    Pulse oximetry, overnight    4. Pulmonary emphysema ([name])  J43.9  Pulmonary function test    Pulse oximetry, overnight    5. Circadian rhythm sleep disorder, delayed sleep phase type  G47.21 Pulmonary function test    Pulse oximetry, overnight    6. Post-nasal drip  R09.82        #Interstitial lung disease   - Provisionally: This is IPF.  It is progressive.  Plan - Start pirfenidone low-dose protocol  - Sending message to pharmacy  - If this medicine causes side effects then we can try Nerandomilast -Do not recommend lung biopsy because of risk - Test overnight pulse oximetry on room air with the CPAP on - Do spirometry and DLCO first available in 6-8 weeks  #Emphysema  PLAN - Continue Breo but do it with you improved technique that we taught you  #Sleep apnea  Plan  - Sending message to Dr. MALVA that your CPAP machine will not be supplied by the DME company anymore after 3 months  #Shortness of breath  - This is because of all of the above issues   PLAN -  - you do not appear to have mobility to do pulmonary rehabilitation but we can assess this in the future - If oxygen does not help then we will check BNP and if this is high consider right heart cath  # Postnasal drip  Plan - Start Astelin  nasal spray 2 squirts into each nostril twice daily  Follow-up - 6-8 weeks with nurse practitioner to discuss pirfenidone uptake    FOLLOWUP    Return in about 6 weeks (around  12/19/2023) for with any of the APPS, after Spiro and DLCO.    SIGNATURE    Dr. Dorethia Cave, M.D., F.C.C.P,  Pulmonary and Critical Care Medicine Staff Physician, [facility] Director - Interstitial Lung Disease  Program  Pulmonary Fibrosis [facility] at [facility] [place], [place], [address]  Pager: [phone], If no answer or between  15:00h - 7:00h: call [phone] Telephone: [phone]  4:20 PM 11/07/2023   HIGh Complexity  OFFICE   2021 E/M guidelines, first released in 2021, with minor  revisions added in 2023. Must meet the requirements for 2 out of 3 dimensions to qualify.    Number and complexity of problems addressed Amount and/or complexity of data reviewed Risk of complications and/or morbidity  Severe exacerbation of chronic illness  Acute or chronic illnesses that may pose a threat to life or bodily function, e.g., multiple trauma, acute MI, pulmonary embolus, severe respiratory distress, progressive rheumatoid arthritis, psychiatric illness with potential threat to self or others, peritonitis, acute renal failure, abrupt change in neurological status Must meet the requirements for 2 of 3 of the categories)  Category 1: Tests and documents, historian  Any combination of 3 of the following:  Assessment requiring an independent historian  Review of prior external note(s) from each unique source  Review of results of each unique test  Ordering of each unique test    Category 2: Interpretation of tests    Independent interpretation of a test performed by another physician/other qualified health care professional (not separately reported)  Category 3: Discuss management/tests  Discussion of management or test interpretation with external physician/other qualified health care professional/appropriate source (not separately reported) - phrmacy  HIGH risk of morbidity from additional diagnostic testing or treatment Examples only:  Drug therapy requiring intensive monitoring for toxicity  Decision for elective major surgery with identified pateint or procedure risk factors  Decision regarding hospitalization or escalation of level of care  Decision for DNR or to de-escalate care   Parenteral controlled  substances

## 2023-11-07 NOTE — Telephone Encounter (Signed)
 Rx team  Low dose esbreit start     SIGNATURE    Dr. Dorethia Cave, M.D., F.C.C.P,  Pulmonary and Critical Care Medicine Staff Physician, [facility] Director - Interstitial Lung Disease  Program  Pulmonary Fibrosis [facility] at [facility] [place], [place], [address]   Pager: [phone], If no answer  -> Check AMION or Try [phone] Telephone (clinical office): [phone] Telephone (research): [phone]  4:20 PM 11/07/2023

## 2023-11-08 ENCOUNTER — Telehealth: Payer: Self-pay

## 2023-11-08 NOTE — Telephone Encounter (Signed)
 Received notification via telephone encounter that pt is pirfenidone new start. Submitted a Prior Authorization request to [employer] for PIRFENIDONE via CoverMyMeds. Will update once we receive a response.  Key: [id number]

## 2023-11-09 ENCOUNTER — Other Ambulatory Visit: Payer: Self-pay | Admitting: Family Medicine

## 2023-11-09 ENCOUNTER — Ambulatory Visit: Admitting: Family Medicine

## 2023-11-09 ENCOUNTER — Ambulatory Visit
Admission: RE | Admit: 2023-11-09 | Discharge: 2023-11-09 | Disposition: A | Discharge status: Home / Self Care | Source: Ambulatory Visit | Attending: Family Medicine | Admitting: Family Medicine

## 2023-11-09 ENCOUNTER — Other Ambulatory Visit (HOSPITAL_COMMUNITY): Payer: Self-pay

## 2023-11-09 VITALS — BP 112/82 | HR 73 | Temp 98.5°F | Ht 66.0 in | Wt 178.2 lb

## 2023-11-09 DIAGNOSIS — R059 Cough, unspecified: Secondary | ICD-10-CM

## 2023-11-09 DIAGNOSIS — M545 Low back pain, unspecified: Secondary | ICD-10-CM

## 2023-11-09 DIAGNOSIS — R053 Chronic cough: Secondary | ICD-10-CM | POA: Diagnosis not present

## 2023-11-09 LAB — POC SOFIA 2 FLU + SARS ANTIGEN FIA
Influenza A, POC: NEGATIVE
Influenza B, POC: NEGATIVE
SARS Coronavirus 2 Ag: NEGATIVE

## 2023-11-09 NOTE — Telephone Encounter (Signed)
 Received notification from [facility] regarding a prior authorization for PIRFENIDONE. Authorization has been APPROVED from 11/08/23 to 01/23/25. Approval letter sent to scan center.  Per test claim, copay for 30 days supply is $55  Patient can fill through [facility]: [phone]   Authorization # [id number] Phone # [phone]  Will reach out to pt to see if she's ok with copay

## 2023-11-09 NOTE — Progress Notes (Signed)
    SUBJECTIVE:   CHIEF COMPLAINT / HPI: hip pain, cough  Discussed the use of AI scribe software for clinical note transcription with the patient, who gave verbal consent to proceed.  History of Present Illness Selena [name] is an 88 year old female who presents with acute right-sided back pain. She is accompanied by her daughter.  Acute right-sided back pain - Sharp, knife-like pain located above the right hip - Onset sudden after turning over in bed - Duration 48 hours - Pain worsened by coughing, certain movements, and riding in a car over bumps - No radiation down the leg - No previous similar episodes - No medication taken for pain due to concerns about hypertension and concurrent use of Eliquis  - Heat and Salonpas patch provided minimal relief - No trial of ice - New onset of simultaneous urge to urinate and have a bowel movement occurring five times on the day of visit - No numbness or tingling in the legs - No difficulty controlling bladder or bowels aside from the new pattern - No blood in urine - No fevers  Chronic cough and upper respiratory symptoms - Chronic cough producing phlegm, sometimes thick and clear, other times yellow - Recent increase in coughing - Recent onset of runny nose   PERTINENT  PMH / PSH: A fib, HTN, ILD, OSA, Follicular lymphoma  OBJECTIVE:   BP 112/82   Pulse 73   Temp 98.5 F (36.9 C) (Oral)   Ht 5' 6 (1.676 m)   Wt 178 lb 4 oz (80.9 kg)   SpO2 100%   BMI 28.77 kg/m   Physical Exam General: NAD, well appearing Neuro: A&O Respiratory: normal WOB on RA Extremities: Moving all 4 extremities equally Back Inspection: No obvious deformity, erythema, swelling, ecchymoses  Palpation: NTTP at lumbar spinous processes, mild tenderness to palpation at the right lumbar paraspinal muscles ROM: ROM of back limited due to pain and functionality at current age Special Tests: Negative straight leg raise on the right Strength: 5/5  bilateral lower extremities in all major muscle groups     ASSESSMENT/PLAN:   Assessment & Plan Acute right-sided low back pain without sciatica Suspect this is secondary to right-sided lumbar para muscle spasm versus lumbar disc herniation.  Compression fracture less likely but will rule out.  Regardless, conservative management is recommended and expect improvement over the next 2 to 4 weeks.  No red flags to warrant urgent MRI. - Tylenol  1000 mg TID for pain. - Consider OTC 4% lidocaine  patches. - Order lumbar spine x-ray for compression fracture evaluation. - Continue heat application if effective. - Follow-up 1 week Chronic coughing Known chronic cough in the setting of interstitial lung disease.  Saw pulmonology on 10/14 with additional postnasal drip.  Rediscussed starting Astelin  as recommended by her pulmonologist. - Can use over-the-counter cough syrup if it does not contain diphenhydramine  or codeine  -Can trial Zyrtec for possible allergic component Return in about 1 week (around 11/16/2023).  Ozell Provencal, MD, PGY-3 [facility] 2:31 PM 11/09/2023  [facility]

## 2023-11-09 NOTE — Patient Instructions (Addendum)
 It was great to see you! Thank you for allowing me to participate in your care!  Our plans for today:  - I have ordered x-rays of your back and hip to make sure you do not have a compression fracture. - You can go to the [facility] imaging center on [address] anytime during the workday to have these images done. - Please take Tylenol  up to 1000 mg 4 times a day as needed for pain. - Please use over-the-counter lidocaine  patches over the right side of your back as well to help with the pain. - If this does not improve your pain to a manageable level please call the clinic so I can send a muscle relaxant to your pharmacy. - I will see you again in 1 week to see how you are doing. - Please use the nasal spray that you are lung doctor recommended to help with your cough.   Please arrive 15 minutes PRIOR to your next scheduled appointment time! If you do not, this affects OTHER patients' care.  Take care and seek immediate care sooner if you develop any concerns.   Ozell Provencal, MD, PGY-3 [facility] 2:25 PM 11/09/2023  [facility]

## 2023-11-09 NOTE — Assessment & Plan Note (Signed)
 Known chronic cough in the setting of interstitial lung disease.  Saw pulmonology on 10/14 with additional postnasal drip.  Rediscussed starting Astelin  as recommended by her pulmonologist. - Can use over-the-counter cough syrup if it does not contain diphenhydramine  or codeine  -Can trial Zyrtec for possible allergic component

## 2023-11-09 NOTE — Telephone Encounter (Signed)
 Spoke with the patient and she is ok with the copay. She said she was told she should be close to her OOP max. Advised her she will be hearing from [facility] for clinical consultation.

## 2023-11-13 ENCOUNTER — Ambulatory Visit: Payer: Self-pay | Admitting: Family Medicine

## 2023-11-13 NOTE — Telephone Encounter (Signed)
 Kindly place order for CPAP supplies

## 2023-11-13 NOTE — Telephone Encounter (Signed)
 Called patient to discuss imaging.  Discussed that there is no acute compression fracture visualized in the spine.  She does have severe osteoarthritis of her lumbar back and hip.  This is as we discussed in our office visit.  She notes that her pain has significantly improved since then and expects to continue improving.  She notes that her brother-in-law gave her some kind of leg brace that has helped with her pain.  Recommend she continue her current care and to call or schedule an appointment if any changes do occur.  Otherwise follow-up as needed.

## 2023-11-14 ENCOUNTER — Telehealth: Payer: Self-pay

## 2023-11-14 ENCOUNTER — Other Ambulatory Visit: Payer: Self-pay

## 2023-11-14 DIAGNOSIS — J849 Interstitial pulmonary disease, unspecified: Secondary | ICD-10-CM

## 2023-11-14 DIAGNOSIS — J84112 Idiopathic pulmonary fibrosis: Secondary | ICD-10-CM

## 2023-11-14 DIAGNOSIS — Z5181 Encounter for therapeutic drug level monitoring: Secondary | ICD-10-CM

## 2023-11-14 DIAGNOSIS — G4733 Obstructive sleep apnea (adult) (pediatric): Secondary | ICD-10-CM

## 2023-11-14 MED ORDER — PIRFENIDONE 267 MG PO TABS
534.0000 mg | ORAL_TABLET | Freq: Three times a day (TID) | ORAL | 1 refills | Status: DC
  Filled 2023-11-15 – 2023-12-08 (×2): qty 180, 30d supply, fill #0

## 2023-11-14 MED ORDER — PIRFENIDONE 267 MG PO TABS
ORAL_TABLET | ORAL | 0 refills | Status: DC
  Filled 2023-11-15: qty 159, 30d supply, fill #0

## 2023-11-14 NOTE — Telephone Encounter (Signed)
 Counseling for Esbriet in separate thread. Patient is aware she will expect call from [facility] for onboarding.   Aleck Puls, PharmD, BCPS, CPP Clinical Pharmacist  [facility]

## 2023-11-14 NOTE — Telephone Encounter (Signed)
 Last OV with Dr. Geronimo was 11/07/23. Plan to start pirfenidone low-dose protocol.  Patient counseled on purpose, proper use, and potential adverse effects including nausea, vomiting, abdominal pain, GERD, weight loss, arthralgia, dizziness, and suns sensitivity/rash.  Stressed the importance of routine lab monitoring. Will monitor LFT's every month for the first 6 months of treatment then every 3 months. LFTs wnl 06/05/23.  Starting dose will be Esbriet 267 mg 1 tablet three times daily for 7 days, then 2 tablets three times daily for 7 days thereafter. ** LOW DOSE PROTOCOL ** Stressed the importance of taking with meals to minimize stomach upset.    Rx triaged to [facility]. She is aware to look out for call from [facility] for onboarding.  Will have our team call patient to schedule lab appointments.  Provided pharmacy team contact information in case she has questions.  Aleck Puls, PharmD, BCPS, CPP Clinical Pharmacist  [facility]

## 2023-11-15 ENCOUNTER — Other Ambulatory Visit (HOSPITAL_COMMUNITY): Payer: Self-pay

## 2023-11-15 ENCOUNTER — Other Ambulatory Visit: Payer: Self-pay

## 2023-11-15 NOTE — Progress Notes (Unsigned)
 Specialty Pharmacy Initial Fill Coordination Note  Selena [name] is a 88 y.o. female contacted today regarding initial fill of specialty medication(s) Pirfenidone   Patient requested Delivery   Delivery date: 11/17/23   Verified address: [address], [place], [place] [address]   Medication will be filled on 10/23.   Patient is aware of $9.10 copayment.

## 2023-11-16 ENCOUNTER — Other Ambulatory Visit: Payer: Self-pay

## 2023-11-17 NOTE — Progress Notes (Signed)
 Patient was counseled in detail on 11/14/23. Using low dose protocol pirfenidone.   Aleck Puls, PharmD, BCPS, CPP Clinical Pharmacist  [facility]

## 2023-11-17 NOTE — Telephone Encounter (Signed)
 Patient called- reports she got pirfenidone in the mail. Plans to start tomorrow (11/18/23). Lab appt booked 12/14/23. Lab orders in.

## 2023-11-25 ENCOUNTER — Other Ambulatory Visit: Payer: Self-pay | Admitting: Nurse Practitioner

## 2023-11-25 DIAGNOSIS — I4821 Permanent atrial fibrillation: Secondary | ICD-10-CM

## 2023-11-27 ENCOUNTER — Ambulatory Visit: Payer: Self-pay | Admitting: Family Medicine

## 2023-11-27 ENCOUNTER — Ambulatory Visit: Admitting: Family Medicine

## 2023-11-27 VITALS — BP 124/68 | HR 89 | Ht 66.0 in | Wt 175.5 lb

## 2023-11-27 DIAGNOSIS — R059 Cough, unspecified: Secondary | ICD-10-CM

## 2023-11-27 LAB — POC SOFIA 2 FLU + SARS ANTIGEN FIA
Influenza A, POC: NEGATIVE
Influenza B, POC: NEGATIVE
SARS Coronavirus 2 Ag: NEGATIVE

## 2023-11-27 NOTE — Telephone Encounter (Signed)
 Pt last saw Selena [name] 02/20/23, last labs 06/05/23 Creat 0.61, age 88, weight 80.9kg, based on specified criteria pt is on appropriate dosage of Eliquis  5mg  BID for afib.  Will refill rx.

## 2023-11-27 NOTE — Patient Instructions (Signed)
 1) You most likely have a viral upper respiratory infection (aka a cold). Your body will naturally fight off this infection over the next 7-14 days. You may have a lingering cough for 6-8 weeks after the infection is gone, this is normal and helps to clear debris out of your lungs.  - We will check your flu and covid tests today. I will send the results on MyChart.   -Drink plenty of water and fluids to stay hydrated.  Your urine should be clear or light yellow. -Take Tylenol  or ibuprofen as needed for fevers of the 100.3 Fahrenheit or higher -You can take 1 tablespoon of bees honey 4 times a day.  This can help soothe your throat. - Continue taking your other medications and inhalers that your pulmonologist prescribed.   Please seek further medical attention if you: - have trouble breathing - are vomiting uncontrollably - are unable to drink enough to stay hydrated  - have fevers of 100.42F or higher for 3 days in a row

## 2023-11-27 NOTE — Progress Notes (Unsigned)
    SUBJECTIVE:   CHIEF COMPLAINT / HPI:   RP is a 88yo F that pf cough.  - Reports 2 days of cough and runny nose. - Feels more short of breath.  - Cough is productive for clear sputum. When she coughs it up, it feels better.  - She next sees Pulmonology on November 20th. She was started on pirfenidone at her prior visit.  - She uses a CPAP machine - Denies fevers - Denies sick cntacts or travel  PERTINENT  PMH / PSH: Afib, HTN, OSA, Interstitial lung disease  OBJECTIVE:   BP 124/68   Pulse 89   Ht 5' 6 (1.676 m)   Wt 175 lb 8 oz (79.6 kg)   SpO2 97%   BMI 28.33 kg/m   General: Alert, pleasant woman. NAD. HEENT: NCAT. MMM. CV: RRR, no murmurs.   Resp: CTAB, no wheezing or crackles. Normal WOB on RA.  Abm: Soft, nontender, nondistended. BS present. Ext: Moves all ext spontaneously Skin: Warm, well perfused   ASSESSMENT/PLAN:   Assessment & Plan Cough, unspecified type Differential includes viral URI, PNA, ILD exacerbation. Viral URI most likely given lack of focal lung findings, mild degree of symptoms, stable vitals, benign lung exam. Given hx of ILD, will check for flu/covid. Even though pt is >48hrs from start of symptoms, could still consider antivirals if flu/covid positive given lung comobidities. - f/u flu covid - supportive care and return precautions     Twyla Nearing, MD [facility]

## 2023-11-28 ENCOUNTER — Other Ambulatory Visit: Payer: Self-pay

## 2023-11-29 ENCOUNTER — Other Ambulatory Visit: Payer: Self-pay | Admitting: Family Medicine

## 2023-11-30 ENCOUNTER — Telehealth: Payer: Self-pay | Admitting: Pharmacist

## 2023-11-30 NOTE — Telephone Encounter (Signed)
 Patient contacted for follow-up of potential need for Eliquis  (apixaban ).   Since last contact patient reports that she recently met her deductible for the year and has filler her Eliquis  (apixaban ) at $0 for the prescription.   She asked about her respiratory test results.  I shared that her Flu and Covid tests were NEGATIVE - completed 11/3.  She reports her cough remains productive.  She believes it may be due to her new pulomonary medication (Pirfenidone).   Total time with patient call and documentation of interaction: 7 minutes.  F/U Phone call planned: None

## 2023-12-01 NOTE — Telephone Encounter (Signed)
 Reviewed and agree with Dr Rennis plan.

## 2023-12-08 ENCOUNTER — Other Ambulatory Visit: Payer: Self-pay

## 2023-12-08 ENCOUNTER — Other Ambulatory Visit (HOSPITAL_COMMUNITY): Payer: Self-pay

## 2023-12-12 ENCOUNTER — Other Ambulatory Visit: Payer: Self-pay

## 2023-12-12 ENCOUNTER — Other Ambulatory Visit: Payer: Self-pay | Admitting: Pharmacy Technician

## 2023-12-12 NOTE — Progress Notes (Signed)
 Specialty Pharmacy Refill Coordination Note  Selena [name] is a 88 y.o. female contacted today regarding refills of specialty medication(s) Pirfenidone   Patient requested Delivery   Delivery date: 12/13/23   Verified address: [address]  [place]   Medication will be filled on: 12/12/23

## 2023-12-12 NOTE — Progress Notes (Signed)
 Clinic Pharmacist, Aleck Puls called us  to cancel this fill.  The patient had reached out to her and has bene miserable on therapy, she will follow up with Dr. Geronimo to determine future treatment and we will check in once more next month to be sure she will remain off therapy.

## 2023-12-12 NOTE — Telephone Encounter (Signed)
 Patient calls to discuss concern about pirfenidone side effects -   Endorses dry heaves, frequent diarrhea, very poor appetite, stating nothing tastes good, and I just don't want to eat. She also reports having cough and increased phlegm, which she noticed after she started pirfenidone.   Side effects are persistent throughout the day, sometimes interrupting her sleep at night due to diarrhea. She states she is feeling just miserable.   She is on low-dose protocol of pirfenidone - taking 534mg  by mouth three times daily.   Given the significance of side effects, including interruptions to sleep due to diarrhea, and feeling just miserable, advised to stop taking pirfenidone and await instruction from Dr. Geronimo. Per chart review, Dr. Reeves last note indicates plan to try nerandomilast if she has side effects on pirfenidone low-dose protocol.  Next OV 01/03/24 with Landry Ferrari, NP  Routing to Dr. Geronimo and Landry Ferrari, NP, for further advice or recommendations or medication adjustments if warranted prior to follow-up.  Total time speaking with patient: 12 minutes

## 2023-12-14 ENCOUNTER — Ambulatory Visit: Payer: Self-pay

## 2023-12-14 ENCOUNTER — Other Ambulatory Visit (INDEPENDENT_AMBULATORY_CARE_PROVIDER_SITE_OTHER)

## 2023-12-14 DIAGNOSIS — Z5181 Encounter for therapeutic drug level monitoring: Secondary | ICD-10-CM | POA: Diagnosis not present

## 2023-12-14 LAB — HEPATIC FUNCTION PANEL
ALT: 19 U/L (ref 0–35)
AST: 20 U/L (ref 0–37)
Albumin: 4.2 g/dL (ref 3.5–5.2)
Alkaline Phosphatase: 111 U/L (ref 39–117)
Bilirubin, Direct: 0.1 mg/dL (ref 0.0–0.3)
Total Bilirubin: 0.6 mg/dL (ref 0.2–1.2)
Total Protein: 6 g/dL (ref 6.0–8.3)

## 2023-12-14 NOTE — Telephone Encounter (Signed)
 ATC patient to re-affirm plan. Mailbox is full. At last contact, advised patient to await instruction from Dr. Geronimo. NFN

## 2023-12-14 NOTE — Telephone Encounter (Signed)
 Stop pirfenidone Sometime early part of next year will consider recommending Nerandomilast Ensure follow-up with me

## 2023-12-14 NOTE — Telephone Encounter (Signed)
 Agree with her stopping medication. Wait for further recommendations from Dr. Geronimo and I will see her on 12/10 to discuss

## 2023-12-15 NOTE — Telephone Encounter (Signed)
 I called and spoke to pt. Pt informed of Dr Lem note and verbalized understanding. I scheduled pt to se Dr Geronimo in January 2026. NFN

## 2023-12-25 ENCOUNTER — Ambulatory Visit

## 2023-12-25 VITALS — BP 148/78 | HR 87 | Temp 98.0°F | Ht 66.0 in | Wt 176.4 lb

## 2023-12-25 DIAGNOSIS — J209 Acute bronchitis, unspecified: Secondary | ICD-10-CM

## 2023-12-25 DIAGNOSIS — J849 Interstitial pulmonary disease, unspecified: Secondary | ICD-10-CM

## 2023-12-25 MED ORDER — BENZONATATE 200 MG PO CAPS: 200.0000 mg | ORAL_CAPSULE | Freq: Three times a day (TID) | ORAL | 0 refills | Status: DC | PRN

## 2023-12-25 MED ORDER — PREDNISONE 10 MG PO TABS: ORAL_TABLET | ORAL | 0 refills | Status: AC

## 2023-12-25 MED ORDER — AMOXICILLIN-POT CLAVULANATE 875-125 MG PO TABS: 1.0000 | ORAL_TABLET | Freq: Two times a day (BID) | ORAL | 0 refills | Status: AC

## 2023-12-25 MED ORDER — GUAIFENESIN ER 600 MG PO TB12: 600.0000 mg | ORAL_TABLET | Freq: Two times a day (BID) | ORAL | 0 refills | Status: DC

## 2023-12-25 NOTE — Progress Notes (Signed)
 New Patient Pulmonology Office Visit   Subjective:  Patient ID: Selena [name], female    DOB: Jul 23, 1935  MRN: [id number]  Referred by: McDiarmid, Krystal BIRCH, MD  CC:  Chief Complaint  Patient presents with   Acute Visit    Pt states she is having SOB, pt states she is also having diarrhea and runny nose pt stated this has been going on since Pt was put on pirfenidone   Pt stated sometimes she can cough up phlegm ( clear) but sometimes she can't  Nasal drainage ( clear)   Discussed the use of AI scribe software for clinical note transcription with the patient, who gave verbal consent to proceed.  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 Geronimo.   She has had a persistent productive cough with thick clear mucus and runny nose since starting pirfenidone  (Esbriet ) a few months ago. These symptoms have continued despite stopping pirfenidone  1-2 months ago because she felt lousy.  She has intermittent shortness of breath that improves with CPAP. She also has wheezing. After she expectorates phlegm, her coughing eases for about 20-30 minutes. She uses a Breo inhaler daily.  She was hospitalized for pneumonia in May for 7 days and was treated with prednisone  earlier this year.  She feels tired with poor appetite and usually eats two meals a day. She has had no recent fever or flu-like illness, though she sometimes feels warm.    ROS as above  Allergies: Codeine , Codeine  phosphate, and Adhesive [tape]  Current Outpatient Medications:    albuterol  (VENTOLIN  HFA) 108 (90 Base) MCG/ACT inhaler, INHALE 2 PUFFS INTO THE LUNGS EVERY 4 HOURS AS NEEDED FOR WHEEZING OR SHORTNESS OF BREATH, Disp: 18 g, Rfl: 5   amLODipine  (NORVASC ) 2.5 MG tablet, Take 1 tablet (2.5 mg total) by mouth at bedtime., Disp: 90  tablet, Rfl: 3   ELIQUIS  5 MG TABS tablet, TAKE 1 TABLET(5 MG) BY MOUTH TWICE DAILY, Disp: 180 tablet, Rfl: 1   famotidine  (PEPCID ) 20 MG tablet, TAKE 1 TABLET(20 MG) BY MOUTH AT BEDTIME, Disp: 90 tablet, Rfl: 1   fluticasone  furoate-vilanterol (BREO ELLIPTA ) 200-25 MCG/ACT AEPB, Inhale 1 puff into the lungs daily., Disp: 60 each, Rfl: 5   montelukast  (SINGULAIR ) 10 MG tablet, TAKE 1 TABLET(10 MG) BY MOUTH AT BEDTIME, Disp: 90 tablet, Rfl: 3   pantoprazole  (PROTONIX ) 40 MG tablet, Take 1 tablet (40 mg total) by mouth 2 (two) times daily., Disp: 180 tablet, Rfl: 3   rosuvastatin  (CRESTOR ) 10 MG tablet, Take 1 tablet (10 mg total) by mouth daily., Disp: 90 tablet, Rfl: 3   azelastine  (ASTELIN ) 0.1 % nasal spray, PALCE 2 SPRAYS INTO BOTH NOSTRILS ONCE DAILY FOR 1 DOSE. (Patient not taking: Reported on 12/25/2023), Disp: 30 mL, Rfl: 3   doxepin  (SINEQUAN ) 10 MG/ML solution, SMARTSIG:1 Milliliter(s) By Mouth Every Night PRN (Patient not taking: Reported on 12/25/2023), Disp: , Rfl:    ipratropium (ATROVENT ) 0.06 % nasal spray, Place 2 sprays into both nostrils 4 (four) times daily. (Patient not taking: Reported on 12/25/2023), Disp: 15 mL, Rfl: 12   meclizine  (ANTIVERT ) 25 MG tablet, Take 1 tablet (25 mg total) by mouth daily as needed for dizziness. (Patient not taking: Reported on 12/25/2023), Disp: 30 tablet, Rfl: 0   Pirfenidone  267 MG TABS, Take 1 tablet by mouth  three times daily with food for 7 days. Then, take 2 tablets by mouth three times daily with food thereafter. (Patient not taking: Reported on 12/25/2023), Disp: 159 tablet, Rfl: 0   Pirfenidone  267 MG TABS, Take 2 tablets (534 mg total) by mouth in the morning, at noon, and at bedtime. (Patient not taking: Reported on 12/25/2023), Disp: 180 tablet, Rfl: 1  Current Facility-Administered Medications:    azelastine  (ASTELIN ) 0.1 % nasal spray 2 spray, 2 spray, Each Nare, BID,  Past Medical History:  Diagnosis Date   Acute gastric ulcer with  hemorrhage    Acute on chronic diastolic congestive heart failure ([name]) 10/16/2018   AKI (acute kidney injury)    Anemia    PMH   Aneurysm (arteriovenous) of coronary vessels    Ankle edema, bilateral 01/08/2021   Arthralgia of right knee 11/28/2022   Atrial fibrillation ([name]) 01/01/2007   Atrial fibrillation, permanent ([name]) 01/01/2007   Qualifier: Diagnosis of   By: Jame  MD, Maude FALCON        BACK PAIN, UPPER 07/09/2008   Breast cancer of upper-outer quadrant of right female breast ([name]) 03/21/2011   Breast cancer, stage 1 ([name])    Bruises easily    Cataract    history   Complication of anesthesia    COVID-19 virus infection 03/08/2020   Disorder of tendon of posterior tibial muscle 04/08/2019   Diverticulitis    DIVERTICULOSIS, COLON 12/26/2006   Essential hypertension 10/27/2017   Follicular 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 ([name]) 07/31/2014   Follicular non-Hodgkin's lymphoma ([name])    Frozen shoulder, Left 01/08/2021   Full dentures 01/08/2021   Goiter    multi-nodular   Gustatory rhinitis 01/07/2021   Hemorrhoids 04/08/2021   History of atrioventricular nodal ablation 10/16/2018   History of cardiac radiofrequency ablation 01/25/2007   Hx of colonoscopy 2009   Incontinence of feces 04/08/2021   Influenza 12/18/2020   Insomnia 09/10/2014   Interstitial lung disease ([name])    LIVER FUNCTION TESTS, ABNORMAL, HX OF 12/26/2007   Macular degeneration, bilateral 01/08/2021   Memory loss 05/16/2007   Mild cognitive impairment 09/23/2021   Qualifier: Diagnosis of  By: Jame  MD, Maude FALCON    Multifocal pneumonia 06/05/2023   Multiple gastric ulcers 2021   CC Hematemesis; EGD showed mild abnormalities in the esophagus and multiple gastric ulcers   Nocturnal hypoxemia 05/14/2020   OSTEOARTHRITIS 12/26/2006   takes Diclofenac  daily but has stopped for surgery   Osteoarthritis 12/26/2006   Qualifier: Diagnosis of  By: Nunzio, RN, Dagoberto Caldron    Pain in  right knee 11/28/2022   Paraspinal mass 07/16/2014   Pneumonia 11/11/2017   Pneumonia 01/31/2018   Pneumonia 01/16/2023   Pneumonia due to COVID-19 virus 03/08/2020   PONV (postoperative nausea and vomiting)    Restrictive lung disease 04/05/2021   03/22/2021  FVC 2.37 [82%], FEV1 2.07 [91%], F/F 87, TLC 3.56 [64%], DLCO 17.92 [87%] Mild restriction     pfts 10/10/17 s sign airflow obst though the insp loop is somewhat flattened. Decreased DLCO.     S/P ablation of atrial flutter 07/17/2018   Sensorineural hearing loss (SNHL) of both ears 01/07/2021   Sepsis ([name]) 04/02/2020   Shortness of breath 05/28/2019   Skin cancer    Syncope 08/21/2018   Thoracic aortic aneurysm 10/16/2018   Tibialis posterior tendinopathy 04/08/2019   Unspecified disorder of synovium and tendon, right lower leg 04/08/2019   Upper airway cough syndrome 10/27/2017  pfts 10/10/17 s sign airflow obst though the insp loop is somewhat flattened  - Allergy  profile 10/26/2017 >  Eos 0.3 /  IgE  6 RAST neg - rx for gerd 10/26/17 > improved 12/07/2017    Urinary frequency 03/22/2022   Vasomotor rhinitis 01/08/2021   VERTIGO 09/07/2009   Has had Vestibular Rehab consultation   Visual impairment 01/07/2021   Wears dentures    Wears glasses    Wears hearing aid    bilateral   Past Surgical History:  Procedure Laterality Date   ABDOMINAL HYSTERECTOMY     ABLATION OF DYSRHYTHMIC FOCUS  2009   APPENDECTOMY     AUGMENTATION MAMMAPLASTY Right 2013   Subpectoral implant   BIOPSY  05/07/2019   Procedure: BIOPSY;  Surgeon: Wilhelmenia Aloha Raddle., MD;  Location: [facility];  Service: Gastroenterology;;   BREAST BIOPSY  2013   BREAST RECONSTRUCTION  04/14/2011   Procedure: BREAST RECONSTRUCTION;  Surgeon: Alm Sick, MD;  Location: [facility];  Service: Plastics;  Laterality: Right;  Placement of Right Breast Tissue Expander with  use of Flex HD for Breast Reconstruction   BREAST SURGERY     right sm, snbx   CATARACT  EXTRACTION W/ INTRAOCULAR LENS  IMPLANT, BILATERAL  2010   bilateral   COLONOSCOPY     COLONOSCOPY     ESOPHAGOGASTRODUODENOSCOPY (EGD) WITH PROPOFOL  N/A 05/07/2019   Procedure: ESOPHAGOGASTRODUODENOSCOPY (EGD) WITH PROPOFOL ;  Surgeon: Wilhelmenia Aloha Raddle., MD;  Location: [facility];  Service: Gastroenterology;  Laterality: N/A;   KNEE SURGERY  2004/2010   arthroscopic/ bilateral knee replacements   LOOP RECORDER IMPLANT     MASTECTOMY Right 2013   PORT-A-CATH REMOVAL N/A 12/07/2016   Procedure: REMOVAL PORT-A-CATH;  Surgeon: Ebbie Cough, MD;  [facility];  Service: General;  Laterality: N/A;   PORTACATH PLACEMENT Right 08/11/2014   Procedure: INSERTION PORT-A-CATH WITH ULTRASOUND;  Surgeon: Cough Ebbie, MD;  Location: [facility] OR;  Service: General;  Laterality: Right;   ROTATOR CUFF REPAIR  1999   right   tmi  1998   TONSILLECTOMY     TOTAL KNEE ARTHROPLASTY Bilateral    Family History  Problem Relation Age of Onset   Colon cancer Mother    Cancer Mother        colon   Colon cancer Maternal Aunt    Cancer Maternal Uncle    Prostate cancer Maternal Uncle    Prostate cancer Maternal Uncle    Other Maternal Uncle    Cancer Sister        kidney   Cancer Brother        lymph node   Anesthesia problems Neg Hx    Hypotension Neg Hx    Malignant hyperthermia Neg Hx    Pseudochol deficiency Neg Hx    Breast cancer Neg Hx    Social History   Socioeconomic History   Marital status: Widowed    Spouse name: Not on file   Number of children: 2   Years of education: 40   Highest education level: High school graduate  Occupational History   Occupation: retired  Tobacco Use   Smoking status: Former    Current packs/day: 0.00    Average packs/day: 0.1 packs/day for 20.0 years (2.0 ttl pk-yrs)    Types: Cigarettes    Start date: 01/24/1958    Quit date: 01/24/1978    Years since quitting: 45.9    Passive exposure: Past   Smokeless tobacco: Never  Vaping Use  Vaping status: Never Used  Substance and Sexual Activity   Alcohol use: No   Drug use: No   Sexual activity: Not Currently    Birth control/protection: Surgical  Other Topics Concern   Not on file  Social History Narrative   Mrs Reade's two children live beside her, son and daughter-in-law Taygan Connell)   She worked at [employer]   She has a walker, cane, BSC, and lift chair at home.   Social Drivers of Corporate Investment Banker Strain: Low Risk  (09/18/2023)   Overall Financial Resource Strain (CARDIA)    Difficulty of Paying Living Expenses: Not hard at all  Food Insecurity: No Food Insecurity (09/18/2023)   Hunger Vital Sign    Worried About Running Out of Food in the Last Year: Never true    Ran Out of Food in the Last Year: Never true  Transportation Needs: No Transportation Needs (09/18/2023)   PRAPARE - Administrator, Civil Service (Medical): No    Lack of Transportation (Non-Medical): No  Physical Activity: Sufficiently Active (09/18/2023)   Exercise Vital Sign    Days of Exercise per Week: 6 days    Minutes of Exercise per Session: 30 min  Stress: No Stress Concern Present (09/18/2023)   [employer] of Occupational Health - Occupational Stress Questionnaire    Feeling of Stress: Not at all  Social Connections: Moderately Integrated (09/18/2023)   Social Connection and Isolation Panel    Frequency of Communication with Friends and Family: More than three times a week    Frequency of Social Gatherings with Friends and Family: Three times a week    Attends Religious Services: More than 4 times per year    Active Member of Clubs or Organizations: Yes    Attends Banker Meetings: More than 4 times per year    Marital Status: Widowed  Intimate Partner Violence: Not At Risk (09/18/2023)   Humiliation, Afraid, Rape, and Kick questionnaire    Fear of Current or Ex-Partner: No    Emotionally Abused: No    Physically Abused: No     Sexually Abused: No       Objective:  There were no vitals taken for this visit. Wt Readings from Last 3 Encounters:  12/25/23 176 lb 6.4 oz (80 kg)  11/27/23 175 lb 8 oz (79.6 kg)  11/09/23 178 lb 4 oz (80.9 kg)   BMI Readings from Last 3 Encounters:  12/25/23 28.47 kg/m  11/27/23 28.33 kg/m  11/09/23 28.77 kg/m   SpO2 Readings from Last 3 Encounters:  12/25/23 96%  11/27/23 97%  11/09/23 100%    Physical Exam Constitutional:      Appearance: Normal appearance.  HENT:     Head: Normocephalic.  Eyes:     Pupils: Pupils are equal, round, and reactive to light.  Cardiovascular:     Rate and Rhythm: Normal rate and regular rhythm.  Pulmonary:     Effort: Pulmonary effort is normal.     Breath sounds: Normal breath sounds.     Comments: No wheezing. Coughing during the encounter Musculoskeletal:     Comments: No LE edema  Neurological:     General: No focal deficit present.     Mental Status: She is alert and oriented to person, place, and time.     Diagnostic Review:  CXR 12/25/2023: similar changes compared to prior CXR  CT chest 08/2023 Pulmonary parenchymal pattern of interstitial lung disease, as detailed above, similar to  minimally progressive from 06/27/2022 and clearly progressive from 12/09/2021. Findings may be due to usual interstitial pneumonitis. The possibility of fibrotic hypersensitivity pneumonitis is also considered. Findings are categorized as probable UIP  Echo 09/26/23 1. Left ventricular ejection fraction, by estimation, is 60 to 65%. The  left ventricle has normal function. The left ventricle has no regional  wall motion abnormalities. Left ventricular diastolic parameters were  normal.   2. Right ventricular systolic function is normal. The right ventricular  size is normal.   3. Left atrial size was severely dilated.   4. Right atrial size was severely dilated.   5. The mitral valve is normal in structure. Mild to moderate mitral  valve  regurgitation. No evidence of mitral stenosis.   6. The aortic valve is normal in structure. Aortic valve regurgitation is  mild. Aortic valve sclerosis is present, with no evidence of aortic valve  stenosis.   7. There is moderate dilatation of the ascending aorta, measuring 41 mm.   8. The inferior vena cava is normal in size with greater than 50%  respiratory variability, suggesting right atrial pressure of 3 mmHg.    Assessment & Plan:   Assessment & Plan Chronic bronchitis with acute exacerbation Interstitial lung disease  Patient presented with 2 month of phlegm, cough. She thought this was triggered when she started on Esbriet , which it make it worst. She decided to stop due to side effects 2 months ago. She comes with Chronic bronchitis flare-up with persistent cough, mucus production.  She is not able to tolerate Esbriet . She will follow up with Dr. Tonna in a few weeks.   - Ordered chest x-ray to rule out pneumonia> reviewed today, similar than prior no acute opacities. - Prescribed Augmentin  for 7 days. - Prescribed Tessalon  Perles for cough, 3 times daily for 7-10 days, then as needed. - Prescribed Mucinex  for 10 days, then as needed. - Prescribed prednisone  for 7 days with tapering: 40 mg for 3 days, 30 mg for 2 days, 20 mg for 1 day, 10 mg for 1 day. - Continue Breo - Advised follow-up next week if symptoms worsen or new symptoms develop.  Obstructive sleep apnea Managed with CPAP, effective for breathing. - Continue CPAP therapy.   Marny Patch, MD Pulmonary and Critical Care Medicine [facility]

## 2023-12-25 NOTE — Patient Instructions (Addendum)
 Dear Ms. Marcellus;   It seems you have acute bronchitis with constant cough; I will recommend the following: -For the cough: use Tessalon  1 tablet 3 times a day as need it for cough -For the mucus production: Use Mucinex , 1 tablet twice a day as need for mucus. -For bronchitis:   Antibiotic: Augmentin  twice a day for 7 days Prednisone  taper:  40 mg for 3 days, 30 mg for 2 days, 20 mg for 1 day, and 10 mg for 1 day.  -I will check a chest xray today.  -If your symptoms get worst as worsening of shortness of breath, fever, worsening of the cough, please go to the ER for evaluation.  You have a pulmonary appointment coming soon with Dr. Tonna.

## 2023-12-29 NOTE — Telephone Encounter (Signed)
 I called patient to let her know her chest xray was negative for pneumonia. She reported she is feeling much better and her cough and runny nose have decreased significantly.  Marny Patch, MD

## 2024-01-03 ENCOUNTER — Ambulatory Visit: Admitting: *Deleted

## 2024-01-03 ENCOUNTER — Telehealth: Payer: Self-pay | Admitting: Primary Care

## 2024-01-03 ENCOUNTER — Telehealth: Payer: Self-pay

## 2024-01-03 ENCOUNTER — Encounter: Payer: Self-pay | Admitting: Primary Care

## 2024-01-03 ENCOUNTER — Ambulatory Visit: Admitting: Primary Care

## 2024-01-03 VITALS — BP 126/62 | HR 92 | Temp 97.4°F | Ht 66.0 in | Wt 170.6 lb

## 2024-01-03 DIAGNOSIS — G4733 Obstructive sleep apnea (adult) (pediatric): Secondary | ICD-10-CM

## 2024-01-03 DIAGNOSIS — J84112 Idiopathic pulmonary fibrosis: Secondary | ICD-10-CM | POA: Diagnosis not present

## 2024-01-03 DIAGNOSIS — J849 Interstitial pulmonary disease, unspecified: Secondary | ICD-10-CM

## 2024-01-03 DIAGNOSIS — Z87891 Personal history of nicotine dependence: Secondary | ICD-10-CM | POA: Diagnosis not present

## 2024-01-03 DIAGNOSIS — J984 Other disorders of lung: Secondary | ICD-10-CM

## 2024-01-03 DIAGNOSIS — J439 Emphysema, unspecified: Secondary | ICD-10-CM | POA: Diagnosis not present

## 2024-01-03 DIAGNOSIS — G4721 Circadian rhythm sleep disorder, delayed sleep phase type: Secondary | ICD-10-CM

## 2024-01-03 DIAGNOSIS — R053 Chronic cough: Secondary | ICD-10-CM | POA: Diagnosis not present

## 2024-01-03 DIAGNOSIS — Z5181 Encounter for therapeutic drug level monitoring: Secondary | ICD-10-CM

## 2024-01-03 LAB — PULMONARY FUNCTION TEST
DL/VA % pred: 104 %
DL/VA: 4.16 ml/min/mmHg/L
DLCO cor % pred: 76 %
DLCO cor: 15.37 ml/min/mmHg
DLCO unc % pred: 76 %
DLCO unc: 15.37 ml/min/mmHg
FEF 25-75 Pre: 1.88 L/s
FEF2575-%Pred-Pre: 151 %
FEV1-%Pred-Pre: 97 %
FEV1-Pre: 1.93 L
FEV1FVC-%Pred-Pre: 110 %
FEV6-%Pred-Pre: 95 %
FEV6-Pre: 2.4 L
FEV6FVC-%Pred-Pre: 106 %
FVC-%Pred-Pre: 90 %
FVC-Pre: 2.4 L
Pre FEV1/FVC ratio: 80 %
Pre FEV6/FVC Ratio: 100 %

## 2024-01-03 MED ORDER — GUAIFENESIN ER 600 MG PO TB12: 600.0000 mg | ORAL_TABLET | Freq: Two times a day (BID) | ORAL | 0 refills | Status: AC

## 2024-01-03 NOTE — Telephone Encounter (Signed)
 Benefits investigation opened in separate thread.

## 2024-01-03 NOTE — Progress Notes (Signed)
 Spirometry and diffusion capacity performed today.

## 2024-01-03 NOTE — Patient Instructions (Signed)
 Spirometry and diffusion capacity performed today.

## 2024-01-03 NOTE — Telephone Encounter (Signed)
 Patient cannot tolerate Esbriet . Due to significant GI intolerance, not a good candidate for other agents with known significant GI side effects (Ofev).   New start Jascayd per message from Landry Ferrari, NP.   Opening benefits investigation in this thread.

## 2024-01-03 NOTE — Progress Notes (Unsigned)
 @Patient  ID: Selena [name], female    DOB: 1935/03/05, 88 y.o.   MRN: [id number]  Chief Complaint  Patient presents with   Interstitial Lung Disease    pft    Referring provider: Geronimo Amel, MD  HPI: 88 year old female, former smoker. PMH HTN, afib, ILD, OSA on CPAP, restrictive lung disease.    Previous [facility] pulmonary encounter: 12/25/23- Dr. Adrien   Discussed the use of AI scribe software for clinical note transcription with the patient, who gave verbal consent to proceed.  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 Geronimo.   She has had a persistent productive cough with thick clear mucus and runny nose since starting pirfenidone  (Esbriet ) a few months ago. These symptoms have continued despite stopping pirfenidone  1-2 months ago because she felt lousy.  She has intermittent shortness of breath that improves with CPAP. She also has wheezing. After she expectorates phlegm, her coughing eases for about 20-30 minutes. She uses a Breo inhaler daily.  She was hospitalized for pneumonia in May for 7 days and was treated with prednisone  earlier this year.  She feels tired with poor appetite and usually eats two meals a day. She has had no recent fever or flu-like illness, though she sometimes feels warm.  ssment & Plan Chronic bronchitis with acute exacerbation Interstitial lung disease   Patient presented with 2 month of phlegm, cough. She thought this was triggered when she started on Esbriet , which it make it worst. She decided to stop due to side effects 2 months ago. She comes with Chronic bronchitis flare-up with persistent cough, mucus production.  She is not able to tolerate Esbriet . She will follow up with Dr. Tonna in a few weeks.    - Ordered chest x-ray to rule out  pneumonia> reviewed today, similar than prior no acute opacities. - Prescribed Augmentin  for 7 days. - Prescribed Tessalon  Perles for cough, 3 times daily for 7-10 days, then as needed. - Prescribed Mucinex  for 10 days, then as needed. - Prescribed prednisone  for 7 days with tapering: 40 mg for 3 days, 30 mg for 2 days, 20 mg for 1 day, 10 mg for 1 day. - Continue Breo - Advised follow-up next week if symptoms worsen or new symptoms develop.   Obstructive sleep apnea Managed with CPAP, effective for breathing. - Continue CPAP therapy.    01/03/2024- Interim hx  Discussed the use of AI scribe software for clinical note transcription with the patient, who gave verbal consent to proceed.  History of Present Illness Selena [name] is an 88 year old female with idiopathic pulmonary fibrosis and emphysema who presents with medication intolerance and persistent cough.  She was started on Esbriet  in October to slow the progression of idiopathic pulmonary fibrosis but experienced significant intolerance, including loss of appetite, nausea, and vomiting. Despite taking it with meals, she found it difficult to eat and discontinued the medication a couple of weeks ago after consulting with a pharmacy representative.  She has a history of emphysema and uses a Breo inhaler. Additionally, she has sleep apnea and uses a CPAP machine nightly. She is currently facing issues with her CPAP mask as the specific type she uses is no longer available, but she has two masks remaining.  On December 1st, she was treated for acute  bronchitis with Augmentin  and prednisone , which helped reduce her cough. However, she continues to experience a persistent cough with thick, clear phlegm that occasionally chokes her. The cough occurs sporadically, with periods of two to three hours without coughing. No colored mucus production is noted. She uses a nasal spray for nasal drip and congestion and a flutter valve to help with  congestion. She occasionally experiences shortness of breath and sometimes puts on her CPAP when this occurs.     SYMPTOM SCALE - ILD 08/24/2023 01/03/2024 Stopped Esbriet  due to nausea/vomitting   Current weight     O2 use ra RA  Shortness of Breath 0 -> 5 scale with 5 being worst (score 6 If unable to do)   At rest 2 2  Simple tasks - showers, clothes change, eating, shaving 5 5  Household (dishes, doing bed, laundry) 2 5  Shopping 2 3  Walking level at own pace 5 3  Walking up Stairs 5 5  Total (30-36) Dyspnea Score 21 23  How bad is your cough? 3 5  How bad is your fatigue 4 5  How bad is appetiee 1 2  How bad is nausea 0 0- stopped esbriet  due to nausea   How bad is vomiting?   0 0  How bad is diarrhea? 4 0  How bad is anxiety? 3 3  How bad is depression 3 3  Any chronic pain - if so where and how bad no x        SIT STAND TEST - goal 15 times     08/24/2023   11/07/2023   01/03/2024   O2 used ra ra RA  PRobe - finter or forehead finger finger forehead  Number sit and stand completed - goal 15 15 1  min and 54 sec   Time taken to complete Slow 1 min and 25 sec 15 times 10  Resting Pulse Ox/HR/Dyspnea  96% and 82/min and dyspnea of 0/10  97% and HR 85 and score 6 100% RA and HR 77  Peak measures 97 % and 110/min and dyspnea of 8.5/10 98%and HR 109 and score 10 100% RA and HR 112  Final Pulse Ox/HR 95% and 85/min and dyspnea of 6/10 98% and HR 97 and score 9 100% and HR 92  Desaturated </= 88% no   no  Desaturated <= 3% points no   no  Got Tachycardic >/= 90/min yes   yes  Miscellaneous comments Very dyspnec Extremely dyspneic Moderate dyspnea and slow pace       Allergies  Allergen Reactions   Codeine  Other (See Comments)    Hallucinations.  Pt states she can take this now that she can no longer see due to Macular degeneration  Other Reaction(s): Other  Hallucinations.  Pt states she can take this now that she can no longer see due to Macular degeneration      Hallucinations   Codeine  Phosphate Other (See Comments)    Hallucinations   Adhesive [Tape] Other (See Comments)    Electrodes for EKG- skin blistering, erythema    Immunization History  Administered Date(s) Administered   Fluad Quad(high Dose 65+) 09/26/2018   INFLUENZA, HIGH DOSE SEASONAL PF 10/13/2016, 10/12/2017   Influenza Split 10/20/2011   Influenza Whole 11/07/2006   Influenza-Unspecified 09/07/2013, 10/15/2015, 09/26/2018, 10/01/2019   Moderna Sars-Covid-2 Vaccination 02/04/2019, 03/04/2019, 12/24/2019   Pfizer Covid-19 Vaccine Bivalent Booster 65yrs & up 01/07/2021   Pfizer(Comirnaty)Fall Seasonal Vaccine 12 years and older 06/23/2022   Pneumococcal  Conjugate-13 08/26/2013   Pneumococcal Polysaccharide-23 10/29/2007, 08/28/2017   Td 05/27/2008   Tdap 05/30/2018   Zoster, Live 10/29/2007    Past Medical History:  Diagnosis Date   Acute gastric ulcer with hemorrhage    Acute on chronic diastolic congestive heart failure ([name]) 10/16/2018   AKI (acute kidney injury)    Anemia    PMH   Aneurysm (arteriovenous) of coronary vessels    Ankle edema, bilateral 01/08/2021   Arthralgia of right knee 11/28/2022   Atrial fibrillation ([name]) 01/01/2007   Atrial fibrillation, permanent ([name]) 01/01/2007   Qualifier: Diagnosis of   By: Jame  MD, Maude FALCON        BACK PAIN, UPPER 07/09/2008   Breast cancer of upper-outer quadrant of right female breast ([name]) 03/21/2011   Breast cancer, stage 1 ([name])    Bruises easily    Cataract    history   Complication of anesthesia    COVID-19 virus infection 03/08/2020   Disorder of tendon of posterior tibial muscle 04/08/2019   Diverticulitis    DIVERTICULOSIS, COLON 12/26/2006   Essential hypertension 10/27/2017   Follicular 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 ([name]) 07/31/2014   Follicular non-Hodgkin's lymphoma ([name])    Frozen shoulder, Left 01/08/2021   Full dentures 01/08/2021   Goiter    multi-nodular    Gustatory rhinitis 01/07/2021   Hemorrhoids 04/08/2021   History of atrioventricular nodal ablation 10/16/2018   History of cardiac radiofrequency ablation 01/25/2007   Hx of colonoscopy 2009   Incontinence of feces 04/08/2021   Influenza 12/18/2020   Insomnia 09/10/2014   Interstitial lung disease ([name])    LIVER FUNCTION TESTS, ABNORMAL, HX OF 12/26/2007   Macular degeneration, bilateral 01/08/2021   Memory loss 05/16/2007   Mild cognitive impairment 09/23/2021   Qualifier: Diagnosis of  By: Jame  MD, Maude FALCON    Multifocal pneumonia 06/05/2023   Multiple gastric ulcers 2021   CC Hematemesis; EGD showed mild abnormalities in the esophagus and multiple gastric ulcers   Nocturnal hypoxemia 05/14/2020   OSTEOARTHRITIS 12/26/2006   takes Diclofenac  daily but has stopped for surgery   Osteoarthritis 12/26/2006   Qualifier: Diagnosis of  By: Nunzio, RN, Dagoberto Caldron    Pain in right knee 11/28/2022   Paraspinal mass 07/16/2014   Pneumonia 11/11/2017   Pneumonia 01/31/2018   Pneumonia 01/16/2023   Pneumonia due to COVID-19 virus 03/08/2020   PONV (postoperative nausea and vomiting)    Restrictive lung disease 04/05/2021   03/22/2021  FVC 2.37 [82%], FEV1 2.07 [91%], F/F 87, TLC 3.56 [64%], DLCO 17.92 [87%] Mild restriction     pfts 10/10/17 s sign airflow obst though the insp loop is somewhat flattened. Decreased DLCO.     S/P ablation of atrial flutter 07/17/2018   Sensorineural hearing loss (SNHL) of both ears 01/07/2021   Sepsis ([name]) 04/02/2020   Shortness of breath 05/28/2019   Skin cancer    Syncope 08/21/2018   Thoracic aortic aneurysm 10/16/2018   Tibialis posterior tendinopathy 04/08/2019   Unspecified disorder of synovium and tendon, right lower leg 04/08/2019   Upper airway cough syndrome 10/27/2017   pfts 10/10/17 s sign airflow obst though the insp loop is somewhat flattened  - Allergy  profile 10/26/2017 >  Eos 0.3 /  IgE  6 RAST neg - rx for gerd 10/26/17 > improved  12/07/2017    Urinary frequency 03/22/2022   Vasomotor rhinitis 01/08/2021   VERTIGO 09/07/2009   Has had Vestibular Rehab consultation  Visual impairment 01/07/2021   Wears dentures    Wears glasses    Wears hearing aid    bilateral    Tobacco History: Social History   Tobacco Use  Smoking Status Former   Current packs/day: 0.00   Average packs/day: 0.1 packs/day for 20.0 years (2.0 ttl pk-yrs)   Types: Cigarettes   Start date: 01/24/1958   Quit date: 01/24/1978   Years since quitting: 45.9   Passive exposure: Past  Smokeless Tobacco Never   Counseling given: Not Answered   Outpatient Medications Prior to Visit  Medication Sig Dispense Refill   albuterol  (VENTOLIN  HFA) 108 (90 Base) MCG/ACT inhaler INHALE 2 PUFFS INTO THE LUNGS EVERY 4 HOURS AS NEEDED FOR WHEEZING OR SHORTNESS OF BREATH 18 g 5   amLODipine  (NORVASC ) 2.5 MG tablet Take 1 tablet (2.5 mg total) by mouth at bedtime. 90 tablet 3   doxepin  (SINEQUAN ) 10 MG/ML solution SMARTSIG:1 Milliliter(s) By Mouth Every Night PRN     ELIQUIS  5 MG TABS tablet TAKE 1 TABLET(5 MG) BY MOUTH TWICE DAILY 180 tablet 1   famotidine  (PEPCID ) 20 MG tablet TAKE 1 TABLET(20 MG) BY MOUTH AT BEDTIME 90 tablet 1   fluticasone  furoate-vilanterol (BREO ELLIPTA ) 200-25 MCG/ACT AEPB Inhale 1 puff into the lungs daily. 60 each 5   ipratropium (ATROVENT ) 0.06 % nasal spray Place 2 sprays into both nostrils 4 (four) times daily. 15 mL 12   montelukast  (SINGULAIR ) 10 MG tablet TAKE 1 TABLET(10 MG) BY MOUTH AT BEDTIME 90 tablet 3   pantoprazole  (PROTONIX ) 40 MG tablet Take 1 tablet (40 mg total) by mouth 2 (two) times daily. 180 tablet 3   Pirfenidone  267 MG TABS Take 2 tablets (534 mg total) by mouth in the morning, at noon, and at bedtime. 180 tablet 1   rosuvastatin  (CRESTOR ) 10 MG tablet Take 1 tablet (10 mg total) by mouth daily. 90 tablet 3   azelastine  (ASTELIN ) 0.1 % nasal spray PALCE 2 SPRAYS INTO BOTH NOSTRILS ONCE DAILY FOR 1 DOSE. (Patient  not taking: Reported on 01/03/2024) 30 mL 3   benzonatate  (TESSALON ) 200 MG capsule Take 1 capsule (200 mg total) by mouth 3 (three) times daily as needed for up to 20 days for cough. (Patient not taking: Reported on 01/03/2024) 60 capsule 0   guaiFENesin  (MUCINEX ) 600 MG 12 hr tablet Take 1 tablet (600 mg total) by mouth 2 (two) times daily for 15 days. (Patient not taking: Reported on 01/03/2024) 30 tablet 0   meclizine  (ANTIVERT ) 25 MG tablet Take 1 tablet (25 mg total) by mouth daily as needed for dizziness. (Patient not taking: Reported on 01/03/2024) 30 tablet 0   Pirfenidone  267 MG TABS Take 1 tablet by mouth three times daily with food for 7 days. Then, take 2 tablets by mouth three times daily with food thereafter. (Patient not taking: Reported on 01/03/2024) 159 tablet 0   Facility-Administered Medications Prior to Visit  Medication Dose Route Frequency Provider Last Rate Last Admin   azelastine  (ASTELIN ) 0.1 % nasal spray 2 spray  2 spray Each Nare BID           Review of Systems  Review of Systems   Physical Exam  BP 126/62   Pulse 77   Temp (!) 97.4 F (36.3 C)   Ht 5' 6 (1.676 m)   Wt 170 lb 9.6 oz (77.4 kg)   SpO2 100% Comment: ra, resting  BMI 27.54 kg/m  Physical Exam  ***  Lab Results:  CBC    Component Value Date/Time   WBC 11.6 (H) 06/05/2023 0249   RBC 4.40 06/05/2023 0249   HGB 13.9 06/05/2023 0249   HGB 14.4 04/28/2023 1212   HGB 14.5 12/06/2016 1057   HCT 41.8 06/05/2023 0249   HCT 45.0 04/28/2023 1212   HCT 43.4 12/06/2016 1057   PLT 214 06/05/2023 0249   PLT 186 04/28/2023 1212   MCV 95.0 06/05/2023 0249   MCV 96 04/28/2023 1212   MCV 100.0 12/06/2016 1057   MCH 31.6 06/05/2023 0249   MCHC 33.3 06/05/2023 0249   RDW 13.5 06/05/2023 0249   RDW 13.2 04/28/2023 1212   RDW 12.7 12/06/2016 1057   LYMPHSABS 1.4 06/05/2023 0249   LYMPHSABS 1.5 01/16/2023 1159   LYMPHSABS 0.8 (L) 12/06/2016 1057   MONOABS 1.7 (H) 06/05/2023 0249   MONOABS  0.8 12/06/2016 1057   EOSABS 0.4 06/05/2023 0249   EOSABS 0.4 01/16/2023 1159   BASOSABS 0.1 06/05/2023 0249   BASOSABS 0.1 01/16/2023 1159   BASOSABS 0.1 12/06/2016 1057    BMET    Component Value Date/Time   NA 138 06/05/2023 0249   NA 142 01/16/2023 1159   NA 143 12/06/2016 1057   K 4.0 06/05/2023 0249   K 4.2 12/06/2016 1057   CL 106 06/05/2023 0249   CL 107 04/05/2012 1253   CO2 20 (L) 06/05/2023 0249   CO2 27 12/06/2016 1057   GLUCOSE 112 (H) 06/05/2023 0249   GLUCOSE 96 12/06/2016 1057   GLUCOSE 88 04/05/2012 1253   BUN 14 06/05/2023 0249   BUN 11 01/16/2023 1159   BUN 18.5 12/06/2016 1057   CREATININE 0.61 06/05/2023 0249   CREATININE 0.66 04/26/2022 1427   CREATININE 0.80 10/22/2019 1404   CREATININE 0.8 12/06/2016 1057   CALCIUM  8.9 06/05/2023 0249   CALCIUM  9.5 12/06/2016 1057   GFRNONAA >60 06/05/2023 0249   GFRNONAA >60 04/26/2022 1427   GFRAA >60 05/08/2019 0604   GFRAA >60 12/13/2018 1046    BNP    Component Value Date/Time   BNP 297.7 (H) 08/10/2022 1842    ProBNP    Component Value Date/Time   PROBNP 1,278 (H) 10/27/2021 1519   PROBNP 244.0 (H) 08/19/2020 1457    Imaging: DG Chest 2 View Result Date: 12/28/2023 CLINICAL DATA:  Dyspnea EXAM: CHEST - 2 VIEW COMPARISON:  Chest CT performed September 11, 2023 FINDINGS: Mild apical pleural-parenchymal thickening. Mild interstitial thickening and mild peribronchial thickening. No discrete lobar infiltrate. No pleural effusion or pneumothorax. A radiopaque implant projects over the central chest. Degenerative spondylosis in the thoracic spine. IMPRESSION: 1. Underlying changes of chronic lung disease. 2. No definite plain film evidence of acute lobar infiltrate, pleural effusion, or pneumothorax. Electronically Signed   By: Maude Naegeli M.D.   On: 12/28/2023 14:11     Assessment & Plan:   No problem-specific Assessment & Plan notes found for this encounter.   1. OSA on CPAP (Primary)  2.  Interstitial lung disease ([name])  3. Restrictive lung disease   Assessment and Plan Assessment & Plan Idiopathic pulmonary fibrosis Intolerance to Esbriet  due to gastrointestinal side effects, including loss of appetite, nausea, and vomiting. Lung function tests show normal spirometry with 97% lung function and mild diffusion defect. No need for supplemental oxygen. - Discontinued Esbriet . - Initiated Jeskade, a medication with fewer gastrointestinal side effects. - Coordinated with pharmacy for Jeskade prescription. - Scheduled follow-up in 6-8 weeks to assess medication tolerance and efficacy.  Emphysema Managed with  Breo inhaler. - Continue Breo inhaler as prescribed.  Obstructive sleep apnea Managed with CPAP. Reports using CPAP nightly. Issues with mask supply noted, but has two additional masks available. - Continue CPAP therapy. - Will consider refitting for a new mask if current supply is exhausted.  Chronic cough with upper airway congestion Chronic cough with clear, thick mucus production. Likely related to upper airway congestion. Recent treatment for acute bronchitis with Augmentin  and prednisone  reduced cough frequency. Nasal congestion and post-nasal drip noted. Lungs relatively clear on exam. O2 100% RA.  - Use flutter valve to loosen congestion. - Consider Mucinex  or Robitussin for cough management. - Ensure use of Atrovent  nasal spray twice daily. - Provided samples of Mucinex .  Recording duration: 13 minutes      I personally spent a total of *** minutes in the care of the patient today including {Time Based Coding:[id number]}.   Almarie LELON Ferrari, NP 01/03/2024

## 2024-01-03 NOTE — Telephone Encounter (Signed)
 Patient did not tolerate Esbriet  due to nausea/vomiting and cough, recommend starting patient on Jascayd (Nerandomilast). Sending to pharmacy.

## 2024-01-03 NOTE — Patient Instructions (Addendum)
°  VISIT SUMMARY: Today, we discussed your ongoing issues with idiopathic pulmonary fibrosis, emphysema, obstructive sleep apnea, and a persistent cough. We reviewed your medication intolerance and made adjustments to your treatment plan to help manage your symptoms more effectively.  YOUR PLAN: -IDIOPATHIC PULMONARY FIBROSIS: Idiopathic pulmonary fibrosis is a condition where the lungs become scarred and breathing becomes increasingly difficult. Due to your intolerance to Esbriet , we have discontinued it and started you on Jascayd, which should have fewer gastrointestinal side effects. We will follow up in 6-8 weeks to see how you are tolerating the new medication and if it is effective.  -EMPHYSEMA: Emphysema is a lung condition that causes shortness of breath. You should continue using your Breo inhaler as prescribed to help manage your symptoms.  -OBSTRUCTIVE SLEEP APNEA: Obstructive sleep apnea is a condition where your breathing stops and starts during sleep. You should continue using your CPAP machine nightly. If you run out of your current CPAP masks, we will consider refitting you for a new mask.  -CHRONIC COUGH WITH UPPER AIRWAY CONGESTION: Your chronic cough with thick, clear mucus is likely due to upper airway congestion. You should continue using the flutter valve to help loosen congestion and consider taking Mucinex  or Robitussin for cough management. Make sure to use Atrovent  nasal spray twice daily. We have provided you with samples of Mucinex  to try.  INSTRUCTIONS: Start Jascayd (nerandomilast) for pulmonary fibrosis - will come from specialty pharmacy  Keep follow-up January 13th

## 2024-01-05 NOTE — Telephone Encounter (Signed)
 Submitted a Prior Authorization request to [employer] for JASCAYD via CoverMyMeds. Will update once we receive a response.  Key: [id number]

## 2024-01-08 ENCOUNTER — Telehealth: Payer: Self-pay

## 2024-01-08 ENCOUNTER — Other Ambulatory Visit (HOSPITAL_COMMUNITY): Payer: Self-pay

## 2024-01-08 NOTE — Telephone Encounter (Signed)
 Patient called asking about when she will get Jascayd. I explained the process takes several days to ensure it is approved through insurance, appeal is submitted if initial PA denied, and to ensure the copay is affordable. Pharmacy team will reach out with update when we have completed the process. Patient verbalizes understanding and agreement with plan.

## 2024-01-08 NOTE — Telephone Encounter (Signed)
 Received notification from [facility] regarding a prior authorization for JASCAYD. Authorization has been APPROVED from 01/05/24 to 01/23/25. Approval letter sent to scan center.  Per test claim, copay for 30 days supply is $0  Patient can fill through [facility]: [phone]   Authorization # [id number] Phone # [phone]

## 2024-01-08 NOTE — Telephone Encounter (Signed)
 Patient called to ask about a device that was mentioned at her last OV with Landry Ferrari. It sounds like she is describing a pulse oximeter. She was under the impression this would be mailed to her.   She is asking for update on this process. Forwarding to clinical team.

## 2024-01-09 NOTE — Telephone Encounter (Signed)
 Patient received in the mail today,will call with any additional questions.NFN

## 2024-01-10 ENCOUNTER — Telehealth: Payer: Self-pay | Admitting: *Deleted

## 2024-01-10 NOTE — Telephone Encounter (Signed)
 Called pt notified ONO x 1 night on cpap. NFN Copied from CRM #[id number]. Topic: Clinical - Medical Advice >> Jan 09, 2024  1:51 PM Devaughn RAMAN wrote: Reason for CRM: Pt called in and stated she received a wearable oxygen monitor in the mail and she is calling to inquire if she  should wear it 1 night or 2 nights. Please f/u with pt regarding this.

## 2024-01-12 ENCOUNTER — Ambulatory Visit: Attending: Primary Care

## 2024-01-12 ENCOUNTER — Other Ambulatory Visit: Payer: Self-pay

## 2024-01-12 ENCOUNTER — Other Ambulatory Visit (HOSPITAL_COMMUNITY): Payer: Self-pay

## 2024-01-12 DIAGNOSIS — J849 Interstitial pulmonary disease, unspecified: Secondary | ICD-10-CM

## 2024-01-12 MED ORDER — JASCAYD 18 MG PO TABS
18.0000 mg | ORAL_TABLET | Freq: Two times a day (BID) | ORAL | 3 refills | Status: AC
  Filled 2024-01-12: qty 60, fill #0
  Filled 2024-01-16: qty 60, 30d supply, fill #0
  Filled 2024-02-09 – 2024-02-19 (×2): qty 60, 30d supply, fill #1

## 2024-01-12 NOTE — Telephone Encounter (Signed)
 New start counseling in separate encounter. See pharmacotherapy visit note for details.

## 2024-01-12 NOTE — Progress Notes (Signed)
 [facility]  Referring Provider: Dr. Geronimo  Virtual Visit via Telephone Note  I connected with Laray Cumming on 01/12/2024 at  2:30 PM EST by telephone and verified that I am speaking with the correct person using two identifiers.  Location: Patient: home Provider: office   I discussed the limitations, risks, security and privacy concerns of performing an evaluation and management service by telephone and the availability of in person appointments. I also discussed with the patient that there may be a patient responsible charge related to this service. The patient expressed understanding and agreed to proceed.  Subjective:  Patient presents by telephone to [facility] team for [facility] new start education. She is referred by Landry Ferrari, NP and Dr. Geronimo. PMH includes ILD with previous intolerance to Esbriet .  Patient was last seen by Landry Ferrari, NP, on 01/03/24. See telephone thread 01/03/24 - plan to start Jascayd.   Today, patient reports ongoing GI-related side effects even since stopping Esbriet  about a month ago. Reports diarrhea (3-4 times/day), although sometimes she has no diarrhea. Overall poor appetite compared to previous before starting Esbriet . Messaged Landry Ferrari, NP, to discuss - recommendation to use loperamide PRN to manage diarrhea and BRAT diet. If symptoms continue, reach out to PCP.   Objective: Allergies[1]  Outpatient Encounter Medications as of 01/12/2024  Medication Sig   albuterol  (VENTOLIN  HFA) 108 (90 Base) MCG/ACT inhaler INHALE 2 PUFFS INTO THE LUNGS EVERY 4 HOURS AS NEEDED FOR WHEEZING OR SHORTNESS OF BREATH   amLODipine  (NORVASC ) 2.5 MG tablet Take 1 tablet (2.5 mg total) by mouth at bedtime.   doxepin  (SINEQUAN ) 10 MG/ML solution SMARTSIG:1 Milliliter(s) By Mouth Every Night PRN   ELIQUIS  5 MG TABS tablet TAKE 1 TABLET(5 MG) BY MOUTH TWICE DAILY   famotidine  (PEPCID ) 20 MG tablet TAKE 1 TABLET(20 MG) BY  MOUTH AT BEDTIME   fluticasone  furoate-vilanterol (BREO ELLIPTA ) 200-25 MCG/ACT AEPB Inhale 1 puff into the lungs daily.   guaiFENesin  (MUCINEX ) 600 MG 12 hr tablet Take 1 tablet (600 mg total) by mouth 2 (two) times daily for 15 days.   ipratropium (ATROVENT ) 0.06 % nasal spray Place 2 sprays into both nostrils 4 (four) times daily.   meclizine  (ANTIVERT ) 25 MG tablet Take 1 tablet (25 mg total) by mouth daily as needed for dizziness. (Patient not taking: Reported on 01/03/2024)   montelukast  (SINGULAIR ) 10 MG tablet TAKE 1 TABLET(10 MG) BY MOUTH AT BEDTIME   pantoprazole  (PROTONIX ) 40 MG tablet Take 1 tablet (40 mg total) by mouth 2 (two) times daily.   rosuvastatin  (CRESTOR ) 10 MG tablet Take 1 tablet (10 mg total) by mouth daily.   Facility-Administered Encounter Medications as of 01/12/2024  Medication   azelastine  (ASTELIN ) 0.1 % nasal spray 2 spray     Immunization History  Administered Date(s) Administered   Fluad Quad(high Dose 65+) 09/26/2018   INFLUENZA, HIGH DOSE SEASONAL PF 10/13/2016, 10/12/2017   Influenza Split 10/20/2011   Influenza Whole 11/07/2006   Influenza-Unspecified 09/07/2013, 10/15/2015, 09/26/2018, 10/01/2019   Moderna Sars-Covid-2 Vaccination 02/04/2019, 03/04/2019, 12/24/2019   Pfizer Covid-19 Vaccine Bivalent Booster 49yrs & up 01/07/2021   Pfizer(Comirnaty)Fall Seasonal Vaccine 12 years and older 06/23/2022   Pneumococcal Conjugate-13 08/26/2013   Pneumococcal Polysaccharide-23 10/29/2007, 08/28/2017   Td 05/27/2008   Tdap 05/30/2018   Zoster, Live 10/29/2007      PFT's TLC  Date Value Ref Range Status  03/22/2021 3.56 L Final      CMP  Component Value Date/Time   NA 138 06/05/2023 0249   NA 142 01/16/2023 1159   NA 143 12/06/2016 1057   K 4.0 06/05/2023 0249   K 4.2 12/06/2016 1057   CL 106 06/05/2023 0249   CL 107 04/05/2012 1253   CO2 20 (L) 06/05/2023 0249   CO2 27 12/06/2016 1057   GLUCOSE 112 (H) 06/05/2023 0249   GLUCOSE 96  12/06/2016 1057   GLUCOSE 88 04/05/2012 1253   BUN 14 06/05/2023 0249   BUN 11 01/16/2023 1159   BUN 18.5 12/06/2016 1057   CREATININE 0.61 06/05/2023 0249   CREATININE 0.66 04/26/2022 1427   CREATININE 0.80 10/22/2019 1404   CREATININE 0.8 12/06/2016 1057   CALCIUM  8.9 06/05/2023 0249   CALCIUM  9.5 12/06/2016 1057   PROT 6.0 12/14/2023 1020   PROT 6.3 11/30/2021 1657   PROT 6.5 12/06/2016 1057   ALBUMIN 4.2 12/14/2023 1020   ALBUMIN 4.5 11/30/2021 1657   ALBUMIN 3.7 12/06/2016 1057   AST 20 12/14/2023 1020   AST 18 04/26/2022 1427   AST 41 (H) 12/06/2016 1057   ALT 19 12/14/2023 1020   ALT 16 04/26/2022 1427   ALT 57 (H) 12/06/2016 1057   ALKPHOS 111 12/14/2023 1020   ALKPHOS 110 12/06/2016 1057   BILITOT 0.6 12/14/2023 1020   BILITOT 0.7 04/26/2022 1427   BILITOT 0.56 12/06/2016 1057   GFRNONAA >60 06/05/2023 0249   GFRNONAA >60 04/26/2022 1427   GFRAA >60 05/08/2019 0604   GFRAA >60 12/13/2018 1046    HRCT (09/11/23) IMPRESSION: 1. Pulmonary parenchymal pattern of interstitial lung disease, as detailed above, similar to minimally progressive from 06/27/2022 and clearly progressive from 12/09/2021. Findings may be due to usual interstitial pneumonitis. The possibility of fibrotic hypersensitivity pneumonitis is also considered. Findings are categorized as probable UIP per consensus guidelines: Diagnosis of Idiopathic Pulmonary Fibrosis: An Official ATS/ERS/JRS/ALAT Clinical Practice Guideline. Am JINNY Honey Crit Care Med Vol 198, Iss 5, [phone], Sep 24 2016.  Assessment and Plan  Jascayd Medication Management Thoroughly counseled patient on the efficacy, mechanism of action, dosing, administration, adverse effects, and monitoring parameters of Jascayd.  Patient verbalized understanding.   Goals of Therapy: Will not stop or reverse the progression of ILD. It will slow the progression of ILD.   Dosing: Recommended dose will be 18mg  1 tablet twice daily. May be  administered with or without regard to food.   Adverse Effects: Weight loss (nerandomilast monotherapy: 8%) Decreased appetite (nerandomilast monotherapy: 6% to 9%) Diarrhea (nerandomilast monotherapy: 17% to 26%)  Monitoring: Monitor for diarrhea, decreased appetite, weight loss  Drug interactions: nerandomilast is a major substrate of CYP3A4. Avoid use of moderate and strong CYP3A4 inducers or inhibitors.  No documented use of moderate and strong CYP3A4 inducers or inhibitors.   Access: Approval of Jascayd through: insurance Rx sent to: [facility]: [phone]   Medication Reconciliation A drug regimen assessment was performed, including review of allergies, interactions, disease-state management, dosing and immunization history. Medications were reviewed with the patient, including name, instructions, indication, goals of therapy, potential side effects, importance of adherence, and safe use.  PLAN:  - START Jascayd 18mg  1 tablet twice daily. - Start loperamide OTC PRN for diarrhea. Do not exceed 8 tablets in one day. Use BRAT diet. If diarrhea does not improve, contact PCP to discuss symptoms given she stopped Esbriet  about 1 month ago.  - I discussed with patient that diarrhea is possible on Jascayd. Use loperamide PRN to manage. If unmanageable,  STOP Jascayd  and let us  know.  - Follow-up with Dr. Geronimo as planned on 02/06/24  **I discussed the above plan with Landry Ferrari, NP, via secure chat.**  I discussed the assessment and treatment plan with the patient. The patient was provided an opportunity to ask questions and all were answered. The patient agreed with the plan and demonstrated an understanding of the instructions.   The patient was advised to call back or seek an in-person evaluation if the symptoms worsen or if the condition fails to improve as anticipated.  I provided 25 minutes of non-face-to-face time during this encounter.  Aleck Puls,  PharmD, BCPS, CPP Clinical Pharmacist  [facility]  [facility]    [1]  Allergies Allergen Reactions   Codeine  Other (See Comments)    Hallucinations.  Pt states she can take this now that she can no longer see due to Macular degeneration  Other Reaction(s): Other  Hallucinations.  Pt states she can take this now that she can no longer see due to Macular degeneration     Hallucinations   Codeine  Phosphate Other (See Comments)    Hallucinations   Adhesive [Tape] Other (See Comments)    Electrodes for EKG- skin blistering, erythema

## 2024-01-15 ENCOUNTER — Other Ambulatory Visit: Payer: Self-pay

## 2024-01-15 ENCOUNTER — Other Ambulatory Visit (HOSPITAL_COMMUNITY): Payer: Self-pay

## 2024-01-16 ENCOUNTER — Telehealth: Payer: Self-pay

## 2024-01-16 ENCOUNTER — Other Ambulatory Visit: Payer: Self-pay

## 2024-01-16 ENCOUNTER — Other Ambulatory Visit (HOSPITAL_COMMUNITY): Payer: Self-pay

## 2024-01-16 NOTE — Progress Notes (Signed)
 Specialty Pharmacy Initial Fill Coordination Note  Selena [name] is a 88 y.o. female contacted today regarding initial fill of specialty medication(s) Nerandomilast  (Jascayd )   Patient requested Delivery   Delivery date: 01/19/24   Verified address: [address] [place], [place] [address]   Medication will be filled on: 01/17/24   Patient is aware of $0 copayment.    **Please call pt for refills and mail welcome packet**

## 2024-01-16 NOTE — Telephone Encounter (Signed)
 Copied from CRM #[id number]. Topic: Clinical - Prescription Issue >> Jan 16, 2024 11:16 AM Devaughn RAMAN wrote: Reason for CRM: Pt stated she spoke with [facility] on Friday regarding Jascayd  medication, pt stated Aleck advised her [facility] was supposed to reach out to her regarding the medication and getting her started but she has not heard from anyone and pt is wondering what is taking so long, contacted [facility] no answer, please f/u with pt regarding this.

## 2024-01-17 ENCOUNTER — Other Ambulatory Visit: Payer: Self-pay

## 2024-01-17 NOTE — Telephone Encounter (Signed)
 Pt has been contacted about starting the medication. NFN

## 2024-01-23 NOTE — Progress Notes (Signed)
 Selena [name] was contacted by pharmacist on 01/12/24 and received complete Jascayd  education prior to first shipment of medication. Please see 01/12/24 OV note for details.  PLAN:  - START Jascayd  18mg  1 tablet twice daily. - Start loperamide OTC PRN for diarrhea. Do not exceed 8 tablets in one day. Use BRAT diet. If diarrhea does not improve, contact PCP to discuss symptoms given she stopped Esbriet  about 1 month ago.  - I discussed with patient that diarrhea is possible on Jascayd . Use loperamide PRN to manage. If unmanageable, STOP Jascayd  and let us  know.  - Follow-up with Dr. Geronimo as planned on 02/06/24   Patient verbalizes understanding and agreement with plan.  Aleck Puls, PharmD, BCPS, CPP Clinical Pharmacist  [facility]  [facility]

## 2024-01-30 ENCOUNTER — Encounter: Payer: Self-pay | Admitting: Hematology and Oncology

## 2024-01-30 ENCOUNTER — Other Ambulatory Visit: Payer: Self-pay | Admitting: Family Medicine

## 2024-01-30 DIAGNOSIS — Z1231 Encounter for screening mammogram for malignant neoplasm of breast: Secondary | ICD-10-CM

## 2024-02-01 ENCOUNTER — Encounter: Payer: Self-pay | Admitting: Cardiovascular Disease

## 2024-02-06 ENCOUNTER — Encounter: Payer: Self-pay | Admitting: Internal Medicine

## 2024-02-06 ENCOUNTER — Ambulatory Visit: Admitting: Internal Medicine

## 2024-02-06 ENCOUNTER — Encounter: Payer: Self-pay | Admitting: Hematology and Oncology

## 2024-02-06 VITALS — BP 128/66 | HR 104 | Ht 66.0 in | Wt 172.6 lb

## 2024-02-06 DIAGNOSIS — Z87891 Personal history of nicotine dependence: Secondary | ICD-10-CM

## 2024-02-06 DIAGNOSIS — J439 Emphysema, unspecified: Secondary | ICD-10-CM

## 2024-02-06 DIAGNOSIS — R0982 Postnasal drip: Secondary | ICD-10-CM

## 2024-02-06 DIAGNOSIS — T50995A Adverse effect of other drugs, medicaments and biological substances, initial encounter: Secondary | ICD-10-CM | POA: Diagnosis not present

## 2024-02-06 DIAGNOSIS — Z5181 Encounter for therapeutic drug level monitoring: Secondary | ICD-10-CM

## 2024-02-06 DIAGNOSIS — K521 Toxic gastroenteritis and colitis: Secondary | ICD-10-CM | POA: Diagnosis not present

## 2024-02-06 DIAGNOSIS — J849 Interstitial pulmonary disease, unspecified: Secondary | ICD-10-CM

## 2024-02-06 DIAGNOSIS — J84112 Idiopathic pulmonary fibrosis: Secondary | ICD-10-CM

## 2024-02-06 NOTE — Patient Instructions (Addendum)
"    ICD-10-CM   1. IPF (idiopathic pulmonary fibrosis) ([name])  J84.112     2. Diarrhea due to drug  K52.1     3. Encounter for therapeutic drug monitoring  Z51.81     4. Pulmonary emphysema ([name])  J43.9     5. Post-nasal drip  R09.82          #Interstitial lung disease - IPF # Diarrhea due to drug and therapeutic monitoring encounter   - Provisionally: This is IPF.  It is progressive.  Intolerant to pirfenidone .  Currently on JASCAYD  since 01/19/2024 and having severe diarrhea  Plan - Stop Nerandomilast  -and then resume on 02/19/2024 Monday at 1 pill/day  - When you resume Nerandomilast  do not escalate to 2 pills/day = In the future visit can consider research as a care option - If diarrhea is bad do not hesitate to call us  right away  #Diarrhea due to drug  Plan - Take CAROB Flour for Diarrhea due to medication as follows Take 1 DESSERT spoon  size serving [approximately 7 g] before breakfast If still no response in 3 days then add another 7 g at dinner If still no response in 3 days then make it to spoon servings at breakfast and 2 spoon servings at dinner and hold NOTE: Always MIX the CAROB FLOUR with WATER or MILK or JUICE - MIGHT NEED A BLENDER to do it DO NOT EAT CAROB POWER DIRECTLY - it can choke or make you cough   #Emphysema  PLAN - Continue Breo but do it with you improved technique that we taught you  #Sleep apnea  Plan According to Dr. MALVA   # Postnasal drip  Plan - Continue Astelin  nasal spray 2 squirts into each nostril twice daily  Follow-up - 6-8 weeks with nurse practitioner or Dr. Geronimo 15-minute visit to review progress with low-dose Nerandomilast  "

## 2024-02-06 NOTE — Progress Notes (Signed)
 "      Chief complaint: Consult for dyspnea IOV 09/14/2021  HPI: 89 year old with history of OSA on CPAP, CHF, breast cancer, hypertension, non-Hodgkin's lymphoma, chronic A-fib on Eliquis  Referred by Dr. Neda for evaluation of abnormal CT scan She has history of chronic intermittent dyspnea on exertion.  No symptoms at rest.  History notable for COVID-19 pneumonia requiring hospitalization February 2022.  Had a repeat hospitalization in March 2022 with severe sepsis due to E. coli bacteremia and UTI.  She has history of mild OSA on CPAP and follows with Dr. 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  No feather pillows or comforter ILD questionnaire 03/23/2020-negative Smoking history: Remote smoking history.  Quit in the 1980s Travel history: No significant travel history Relevant family history: No family history of lung disease  Interim history: Here for review of follow-up CT Continues to have significant nasal congestion with discharge and cough which is affecting her quality of life  OV 08/24/2023 transfer of care to Dr. Geronimo in the ILD center -   Subjective:  Patient ID: Selena [name], female , DOB: 08-04-1935 , age 34 y.o. , MRN: [id number] , ADDRESS: [address] [place] [address] PCP McDiarmid, Krystal BIRCH, MD Patient Care Team: McDiarmid, Krystal BIRCH, MD as PCP - General (Family Medicine) Verlin Lonni BIRCH, MD as PCP - Cardiology (Cardiology) Neda Jennet LABOR, MD as Consulting Physician (Pulmonary Disease) Vernetta Lonni GRADE, MD as Consulting Physician (Orthopedic Surgery) Odean Potts, MD as Consulting Physician (Hematology and Oncology) [facility], Donell Cardinal, MD as Consulting Physician (Endocrinology) Cara Elida HERO, NP as Nurse Practitioner (Gastroenterology) Waddell Danelle ORN, MD as Consulting Physician (Cardiology) Ebbie Cough, MD as Consulting Physician  (General Surgery) Josh Server, MD as Referring Physician (Ophthalmology)  This Provider for this visit: Treatment Team:  Attending Provider: Geronimo Amel, MD    08/24/2023 -   Chief Complaint  Patient presents with   Consult    ILD Pt experiences SOB with exertion. Pt tries to walk 1 mile a day but has been unable to recently due to the heat.       HPI Selena [name] 89 y.o. -has sleep apnea and follows with Dr. MALVA.  She has other issues such as hypertension hyperlipidemia A-fib remote breast cancer status post with mastectomy and reconstruction, acid reflux.  In February 2022 had COVID and hospitalized then in March 2022 had E. coli sepsis and hospitalized.  In August 2023 did see Dr. Theophilus for ILD evaluation.  It appeared that the CT scan was stabilized and she was asked to come back in 6 months but she had not followed up.  Review of the scans indicate that some 6 months prior to the COVID in February 2022 there was ILD onset [personal visualization and impression].  This was on the scan in April 2021.  There is also reports of this is the September 2020 scan.  Definitely after COVID it is more prominent.  The final high-resolution CT chest was in August 2023 with groundglass opacities craniocaudal gradient reticulation but no honeycombing.  Pattern of post-COVID or NSIP and is felt to be stable compared to the COVID admission but in my opinion more progressive compared to pre-COVID.  Against his backdrop she got admitted for 3 days mid May 2025 with respiratory failure.  She was presented with pain.  CT chest demonstrated multifocal pneumonia and underlying known ILD.  She is treated with antibiotics  and discharge.  Since discharge she is better from a pneumonia perspective but she tells me that she is having worsening dyspnea on exertion for the last 1 month and also some wheezing.  She says she was discharged with [facility] but it is not helping her.  CMA checked a technique of  taking the inhaler and it was faulty and she was retrained.  She is really frustrated with the symptoms  Exercise hypoxemia test shows she is not desaturating here in the office.  Some years ago limited autoimmune panel was negative. .       OV 11/07/2023  Subjective:  Patient ID: Selena [name], female , DOB: October 01, 1935 , age 76 y.o. , MRN: [id number] , ADDRESS: [address] [place] [address] PCP McDiarmid, Krystal BIRCH, MD Patient Care Team: McDiarmid, Krystal BIRCH, MD as PCP - General (Family Medicine) Verlin Lonni BIRCH, MD as PCP - Cardiology (Cardiology) Neda Jennet LABOR, MD as Consulting Physician (Pulmonary Disease) Vernetta Lonni GRADE, MD as Consulting Physician (Orthopedic Surgery) Odean Potts, MD as Consulting Physician (Hematology and Oncology) [facility], Donell Cardinal, MD as Consulting Physician (Endocrinology) Cara Elida HERO, NP as Nurse Practitioner (Gastroenterology) Waddell Danelle ORN, MD as Consulting Physician (Cardiology) Ebbie Cough, MD as Consulting Physician (General Surgery) Josh Server, MD as Referring Physician (Ophthalmology)  This Provider for this visit: Treatment Team:  Attending Provider: Geronimo Amel, MD    11/07/2023 -   Chief Complaint  Patient presents with   Interstitial Lung Disease    Pt states since LOV breathing has been slow  SOB occurs with exertion and w/o ( laying down pt will be SOB) Prod cough phlegm (white, yellow)   #ILD workup in progress  HPI Selena [name] 89 y.o. -returns for follow-up.  Presents with her daughter.  Here to discuss workup in progress.  Symptoms are not slowly progressive over time but compared to August 24, 2023 visit symptom score is the same.  Her exercise hypoxemia test is also stable.  But she feels she is progressing.  She is frustrated by her symptoms.  She did serology and this is negative.  She could not get pulmonary function test done.  We do not know why.   She did have a high-resolution CT chest and I personally visualized this.  This is either a NSIP indeterminate/alternative or probable UIP.  Nevertheless it is progressive.  I discussed with her and her daughter about antifibrotic of choice.  Discussed the new medication Nerandomilast  which can cause diarrhea and 40% of patients with 1% quit rate because of the diarrhea.  She does not want diarrhea as a side effect.  Then discussed the alternative of pirfenidone  low-dose protocol.  Nausea, vomiting, low appetite fatigue and weight loss of the more common side effects.  She is okay with this.  Will start this with a provisional diagnose of IPF  Esbriet /Pirfenidone  requires intensive drug monitoring due to high concerns for Adverse effects of , including  Drug Induced Liver Injury, significant GI side effects that include but not limited to Diarrhea, Nausea, Vomiting,  and other system side effects that include Fatigue, headaches, weight loss and other side effects such as skin rash. These will be monitored with  blood work such as LFT initially once a month for 6 months and then quarterly   Emphysema: She is on Breo and wants to continue it  Shortness of breath: She is complaining of significant class III shortness of breath but does not desaturate on exertion.  Her mobility prevents from doing rehab.  Will text to overnight pulse oximetry on room air with her CPAP on [she is sleep apnea]  Sleep apnea: She tells me that the DME company is going to run out of the CPAP machine.  I need to send a message to Dr. MALVA  Also complaining of continuous chronic postnasal drip: Will do inhaler Astelin .  Of the history.     OV 02/06/2024  Subjective:  Patient ID: Selena [name], female , DOB: 08/10/1935 , age 69 y.o. , MRN: [id number] , ADDRESS: [address] [place] [address] PCP McDiarmid, Krystal BIRCH, MD Patient Care Team: McDiarmid, Krystal BIRCH, MD as PCP - General (Family Medicine) Verlin Lonni BIRCH, MD as PCP - Cardiology (Cardiology) Neda Jennet LABOR, MD as Consulting Physician (Pulmonary Disease) Vernetta Lonni GRADE, MD as Consulting Physician (Orthopedic Surgery) Odean Potts, MD as Consulting Physician (Hematology and Oncology) [facility], Donell Cardinal, MD as Consulting Physician (Endocrinology) Cara Elida HERO, NP as Nurse Practitioner (Gastroenterology) Waddell Danelle ORN, MD as Consulting Physician (Cardiology) Ebbie Cough, MD as Consulting Physician (General Surgery) Josh Server, MD as Referring Physician (Ophthalmology)  This Provider for this visit: Treatment Team:  Attending Provider: Geronimo Amel, MD    02/06/2024 -   Chief Complaint  Patient presents with   Medical Management of Chronic Issues   Interstitial Lung Disease    Breathing has been progressively worse since the last visit. She is coughing more over the past week- prod with clear sputum. Jascayd  causing diarrhea multiple times per day despite taking with food.      HPI Selena [name] 89 y.o. -interstitial lung disease progressive phenotype [provisionally IPF].  Last seen in October 2025 and started on pirfenidone  but then by mid November 2025 she had miserable side effects miserable on therapy and stopped it.  External record review shows on 12/25/2023 she did see Dr. Adrien Guan for bronchitis that she thought was triggered because of pirfenidone .  She was given Augmentin  and prednisone .  Then on 01/03/2024 she followed up with Landry Ferrari our nurse practitioner.  Nerandomilast  was recommended.  This was a started sometime after Christmas 2025 and according to the daughter specifically on 01/19/2024.  The diarrhea is at least 4-5 times a day.  It is better.  It is causing near fissure and anal and pain.  She is not able to go outside her quality of life is miserable but dyspnea itself is not better.  She is wondering about the benefits of the drug She is wondering  about therapeutic options. We discussed about quitting drug altogether but also give the holiday and then do a low-dose option -we took a shared decision making to do give a holiday and do the low-dose option of Nerandomilast .  SYMPTOM SCALE - ILD 08/24/2023 02/06/2024   Current weight    O2 use ra ra  Shortness of Breath 0 -> 5 scale with 5 being worst (score 6 If unable to do)   At rest 2 3  Simple tasks - showers, clothes change, eating, shaving 5 4  Household (dishes, doing bed, laundry) 2 5  Shopping 2 2  Walking level at own pace 5 3  Walking up Stairs  No stairs  Total (30-36) Dyspnea Score 16 17  How bad is your cough? 3 3  How bad is your fatigue 4 4  How bad is appetiee 1 2  How bad is nausea 0 0  How bad is vomiting?  0 0  How bad is diarrhea? 4 5  How bad is anxiety? 3 4  How bad is depression 3 4  Any chronic pain - if so where and how bad no 0       SIT STAND TEST - goal 15 times   08/24/2023  11/07/2023   O2 used ra ra  PRobe - finter or forehead finger finger  Number sit and stand completed - goal 15 15 1  min and 54 sec  Time taken to complete Slow 1 min and 25 sec 15 times  Resting Pulse Ox/HR/Dyspnea  96% and 82/min and dyspnea of 0/10  97% and HR 85 and score 6  Peak measures 97 % and 110/min and dyspnea of 8.5/10 98%and HR 109 and score 10  Final Pulse Ox/HR 95% and 85/min and dyspnea of 6/10 98% and HR 97 and score 9  Desaturated </= 88% no   Desaturated <= 3% points no   Got Tachycardic >/= 90/min yes   Miscellaneous comments Very dyspnec Extremely dyspneic    CT Chest data from date:   - personally visualized and independently interpreted :  - my findings are:   CT Chest data from date:   - personally visualized and independently interpreted : yes - my findings are: as below arrative & Impression  CLINICAL DATA:  Diffuse/interstitial lung disease. Breast cancer, former smoker.   EXAM: CT CHEST WITHOUT CONTRAST    TECHNIQUE: Multidetector CT imaging of the chest was performed following the standard protocol without intravenous contrast. High resolution imaging of the lungs, as well as inspiratory and expiratory imaging, was performed.   RADIATION DOSE REDUCTION: This exam was performed according to the departmental dose-optimization program which includes automated exposure control, adjustment of the mA and/or kV according to patient size and/or use of iterative reconstruction technique.   COMPARISON:  PET 06/30/2023, CT chest 06/05/2023, 06/27/2022, 12/09/2021.   FINDINGS: Cardiovascular: Atherosclerotic calcification of the aorta, aortic valve and coronary arteries. Ascending aorta measures 4.1 cm, stable. Enlarged pulmonic trunk and heart. No pericardial effusion.   Mediastinum/Nodes: Asymmetrically enlarged, heterogeneous and nodular left thyroid . Mediastinal lymph nodes measure up to 10 mm in the low right paratracheal station, unchanged. Hilar regions are difficult to definitively evaluate without IV contrast. No axillary adenopathy. Air in the esophagus can be seen with dysmotility.   Lungs/Pleura: Biapical pleuroparenchymal scarring. Centrilobular and paraseptal emphysema. Peripheral and basilar predominant coarsened ground-glass, subpleural reticulation and traction bronchiectasis/bronchiolectasis, similar to minimally progressive from 06/27/2022 and progressive from 12/09/2021. No definitive honeycombing. Central 9 mm right upper lobe nodule (4/55), unchanged from 12/09/2021. Per Fleischner Society guidelines, no follow-up is necessary. No pleural fluid. Airway is unremarkable. Minimal air trapping.   Upper Abdomen: Small low-attenuation lesion in the inferior right hepatic lobe, too small to characterize. Small hiatal hernia. 8 mm peripherally calcified splenic artery aneurysm. Visualized portions of the liver, gallbladder, adrenal glands, kidneys, spleen, pancreas, stomach  and bowel are otherwise grossly unremarkable. No upper abdominal adenopathy.   Musculoskeletal: Degenerative changes in the spine.   IMPRESSION: 1. Pulmonary parenchymal pattern of interstitial lung disease, as detailed above, similar to minimally progressive from 06/27/2022 and clearly progressive from 12/09/2021. Findings may be due to usual interstitial pneumonitis. The possibility of fibrotic hypersensitivity pneumonitis is also considered. Findings are categorized as probable UIP per consensus guidelines: Diagnosis of Idiopathic Pulmonary Fibrosis: An Official ATS/ERS/JRS/ALAT Clinical Practice Guideline. Am JINNY Honey Crit Care Med Vol 198, Iss 5, [id number], Sep 24 2016. 2. 4.1 cm ascending aortic aneurysm, stable.  Recommend annual imaging followup by CTA or MRA. This recommendation follows 2010 ACCF/AHA/AATS/ACR/ASA/SCA/SCAI/SIR/STS/SVM Guidelines for the Diagnosis and Management of Patients with Thoracic Aortic Disease. Circulation. 2010; 121: Z733-z630. Aortic aneurysm NOS (ICD10-I71.9). 3. Asymmetrically enlarged, heterogeneous and nodular left thyroid , previously evaluated by ultrasound on 06/22/2021, at which time a 1 year follow-up is recommended. Recommend thyroid  ultrasound, as clinically indicated. (Ref: J Am Coll Radiol. 2015 Feb;12(2): 143-50). 4. 8 mm calcified splenic artery aneurysm. 5. Aortic atherosclerosis (ICD10-I70.0). Coronary artery calcification. 6. Enlarged pulmonic trunk, indicative of pulmonary arterial hypertension. 7.  Emphysema (ICD10-J43.9).     Electronically Signed   By: Newell Eke M.D.   On: 09/13/2023 10:58    PFT     Latest Ref Rng & Units 01/03/2024   10:42 AM 03/22/2021    9:46 AM 10/10/2017   11:50 AM  PFT Results  FVC-Pre L 2.40  2.41  2.44   FVC-Predicted Pre % 90  84  81   FVC-Post L  2.37  2.40   FVC-Predicted Post %  82  80   Pre FEV1/FVC % % 80  81  81   Post FEV1/FCV % %  87  87   FEV1-Pre L 1.93  1.94  1.98    FEV1-Predicted Pre % 97  91  88   FEV1-Post L  2.07  2.09   DLCO uncorrected ml/min/mmHg 15.37  17.92  17.08   DLCO UNC% % 76  87  60   DLCO corrected ml/min/mmHg 15.37  17.92    DLCO COR %Predicted % 76  87    DLVA Predicted % 104  139  83   TLC L  3.56  4.43   TLC % Predicted %  64  80   RV % Predicted %  50  80        LAB RESULTS last 96 hours No results found.       has a past medical history of Acute gastric ulcer with hemorrhage, Acute on chronic diastolic congestive heart failure ([name]) (10/16/2018), AKI (acute kidney injury), Anemia, Aneurysm (arteriovenous) of coronary vessels, Ankle edema, bilateral (01/08/2021), Arthralgia of right knee (11/28/2022), Atrial fibrillation ([name]) (01/01/2007), Atrial fibrillation, permanent ([name]) (01/01/2007), BACK PAIN, UPPER (07/09/2008), Breast cancer of upper-outer quadrant of right female breast ([name]) (03/21/2011), Breast cancer, stage 1 ([name]), Bruises easily, Cataract, Complication of anesthesia, COVID-19 virus infection (03/08/2020), Disorder of tendon of posterior tibial muscle (04/08/2019), Diverticulitis, DIVERTICULOSIS, COLON (12/26/2006), Essential hypertension (10/27/2017), Follicular 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 ([name]) (07/31/2014), Follicular non-Hodgkin's lymphoma ([name]), Frozen shoulder, Left (01/08/2021), Full dentures (01/08/2021), Goiter, Gustatory rhinitis (01/07/2021), Hemorrhoids (04/08/2021), History of atrioventricular nodal ablation (10/16/2018), History of cardiac radiofrequency ablation (01/25/2007), colonoscopy (2009), Incontinence of feces (04/08/2021), Influenza (12/18/2020), Insomnia (09/10/2014), Interstitial lung disease ([name]), LIVER FUNCTION TESTS, ABNORMAL, HX OF (12/26/2007), Macular degeneration, bilateral (01/08/2021), Memory loss (05/16/2007), Mild cognitive impairment (09/23/2021), Multifocal pneumonia (06/05/2023), Multiple gastric ulcers (2021), Nocturnal hypoxemia (05/14/2020), OSTEOARTHRITIS  (12/26/2006), Osteoarthritis (12/26/2006), Pain in right knee (11/28/2022), Paraspinal mass (07/16/2014), Pneumonia (11/11/2017), Pneumonia (01/31/2018), Pneumonia (01/16/2023), Pneumonia due to COVID-19 virus (03/08/2020), PONV (postoperative nausea and vomiting), Restrictive lung disease (04/05/2021), S/P ablation of atrial flutter (07/17/2018), Sensorineural hearing loss (SNHL) of both ears (01/07/2021), Sepsis ([name]) (04/02/2020), Shortness of breath (05/28/2019), Skin cancer, Syncope (08/21/2018), Thoracic aortic aneurysm (10/16/2018), Tibialis posterior tendinopathy (04/08/2019), Unspecified disorder of synovium and tendon, right lower leg (04/08/2019), Upper airway cough syndrome (10/27/2017), Urinary frequency (03/22/2022), Vasomotor rhinitis (01/08/2021), VERTIGO (09/07/2009), Visual impairment (01/07/2021), Wears dentures, Wears glasses,  and Wears hearing aid.   reports that she quit smoking about 46 years ago. Her smoking use included cigarettes. She started smoking about 66 years ago. She has a 2 pack-year smoking history. She has been exposed to tobacco smoke. She has never used smokeless tobacco.  Past Surgical History:  Procedure Laterality Date   ABDOMINAL HYSTERECTOMY     ABLATION OF DYSRHYTHMIC FOCUS  2009   APPENDECTOMY     AUGMENTATION MAMMAPLASTY Right 2013   Subpectoral implant   BIOPSY  05/07/2019   Procedure: BIOPSY;  Surgeon: Wilhelmenia Aloha Raddle., MD;  Location: [facility];  Service: Gastroenterology;;   BREAST BIOPSY  2013   BREAST RECONSTRUCTION  04/14/2011   Procedure: BREAST RECONSTRUCTION;  Surgeon: Alm Sick, MD;  Location: [facility];  Service: Plastics;  Laterality: Right;  Placement of Right Breast Tissue Expander with  use of Flex HD for Breast Reconstruction   BREAST SURGERY     right sm, snbx   CATARACT EXTRACTION W/ INTRAOCULAR LENS  IMPLANT, BILATERAL  2010   bilateral   COLONOSCOPY     COLONOSCOPY     ESOPHAGOGASTRODUODENOSCOPY (EGD) WITH PROPOFOL  N/A  05/07/2019   Procedure: ESOPHAGOGASTRODUODENOSCOPY (EGD) WITH PROPOFOL ;  Surgeon: Wilhelmenia Aloha Raddle., MD;  Location: [facility];  Service: Gastroenterology;  Laterality: N/A;   KNEE SURGERY  2004/2010   arthroscopic/ bilateral knee replacements   LOOP RECORDER IMPLANT     MASTECTOMY Right 2013   PORT-A-CATH REMOVAL N/A 12/07/2016   Procedure: REMOVAL PORT-A-CATH;  Surgeon: Ebbie Cough, MD;  Location[facility] OR;  Service: General;  Laterality: N/A;   PORTACATH PLACEMENT Right 08/11/2014   Procedure: INSERTION PORT-A-CATH WITH ULTRASOUND;  Surgeon: Cough Ebbie, MD;  Location: [facility] OR;  Service: General;  Laterality: Right;   ROTATOR CUFF REPAIR  1999   right   tmi  1998   TONSILLECTOMY     TOTAL KNEE ARTHROPLASTY Bilateral     Allergies[1]  Immunization History  Administered Date(s) Administered   Fluad Quad(high Dose 65+) 09/26/2018   INFLUENZA, HIGH DOSE SEASONAL PF 10/13/2016, 10/12/2017, 09/25/2023   Influenza Split 10/20/2011   Influenza Whole 11/07/2006   Influenza-Unspecified 09/07/2013, 10/15/2015, 09/26/2018, 10/01/2019   Moderna Sars-Covid-2 Vaccination 02/04/2019, 03/04/2019, 12/24/2019   Pfizer Covid-19 Vaccine Bivalent Booster 75yrs & up 01/07/2021   Pfizer(Comirnaty)Fall Seasonal Vaccine 12 years and older 06/23/2022   Pneumococcal Conjugate-13 08/26/2013   Pneumococcal Polysaccharide-23 10/29/2007, 08/28/2017   Td 05/27/2008   Tdap 05/30/2018   Zoster, Live 10/29/2007    Family History  Problem Relation Age of Onset   Colon cancer Mother    Cancer Mother        colon   Colon cancer Maternal Aunt    Cancer Maternal Uncle    Prostate cancer Maternal Uncle    Prostate cancer Maternal Uncle    Other Maternal Uncle    Cancer Sister        kidney   Cancer Brother        lymph node   Anesthesia problems Neg Hx    Hypotension Neg Hx    Malignant hyperthermia Neg Hx    Pseudochol deficiency Neg Hx    Breast cancer Neg Hx     Current  Medications[2]      Objective:   Vitals:   02/06/24 1340  BP: 128/66  Pulse: (!) 104  SpO2: 99%  Weight: 172 lb 9.6 oz (78.3 kg)  Height: 5' 6 (1.676 m)    Estimated body mass index is 27.86 kg/m as  calculated from the following:   Height as of this encounter: 5' 6 (1.676 m).   Weight as of this encounter: 172 lb 9.6 oz (78.3 kg).  @WEIGHTCHANGE @  [employer]   02/06/24 1340  Weight: 172 lb 9.6 oz (78.3 kg)     Physical Exam   General: No distress. Looks well O2 at rest: no Cane present: no Sitting in wheel chair: no Frail: no Obese: no Neuro: Alert and Oriented x 3. GCS 15. Speech normal Psych: Pleasant Resp:  Barrel Chest - nono.  Wheeze - no, Crackles - yes base, No overt respiratory distress CVS: Normal heart sounds. Murmurs - no Ext: Stigmata of Connective Tissue Disease - no HEENT: Normal upper airway. PEERL +. No post nasal drip        Assessment/     Assessment & Plan IPF (idiopathic pulmonary fibrosis) ([name])  Diarrhea due to drug  Encounter for therapeutic drug monitoring  Pulmonary emphysema ([name])  Post-nasal drip    PLAN Patient Instructions     ICD-10-CM   1. IPF (idiopathic pulmonary fibrosis) ([name])  J84.112     2. Diarrhea due to drug  K52.1     3. Encounter for therapeutic drug monitoring  Z51.81     4. Pulmonary emphysema ([name])  J43.9     5. Post-nasal drip  R09.82          #Interstitial lung disease - IPF # Diarrhea due to drug and therapeutic monitoring encounter   - Provisionally: This is IPF.  It is progressive.  Intolerant to pirfenidone .  Currently on JASCAYD  since 01/19/2024 and having severe diarrhea  Plan - Stop Nerandomilast  -and then resume on 02/19/2024 Monday at 1 pill/day  - When you resume Nerandomilast  do not escalate to 2 pills/day = In the future visit can consider research as a care option - If diarrhea is bad do not hesitate to call us  right away  #Diarrhea due to drug  Plan - Take  CAROB Flour for Diarrhea due to medication as follows Take 1 DESSERT spoon  size serving [approximately 7 g] before breakfast If still no response in 3 days then add another 7 g at dinner If still no response in 3 days then make it to spoon servings at breakfast and 2 spoon servings at dinner and hold NOTE: Always MIX the CAROB FLOUR with WATER or MILK or JUICE - MIGHT NEED A BLENDER to do it DO NOT EAT CAROB POWER DIRECTLY - it can choke or make you cough   #Emphysema  PLAN - Continue Breo but do it with you improved technique that we taught you  #Sleep apnea  Plan According to Dr. MALVA   # Postnasal drip  Plan - Continue Astelin  nasal spray 2 squirts into each nostril twice daily  Follow-up - 6-8 weeks with nurse practitioner or Dr. Geronimo 15-minute visit to review progress with low-dose Nerandomilast     FOLLOWUP    Return for 6-8 weeks with nurse practitioner or Dr. Geronimo 15-minute visit .    SIGNATURE    Dr. Dorethia Geronimo, M.D., F.C.C.P,  Pulmonary and Critical Care Medicine Staff Physician, [facility] Director - Interstitial Lung Disease  Program  Pulmonary Fibrosis [facility] [place], [place], [address]  Pager: [phone], If no answer or between  15:00h - 7:00h: call [phone] Telephone: [phone]  2:17 PM 02/06/2024     [1]  Allergies Allergen Reactions  Codeine  Other (See Comments)    Hallucinations.  Pt states she can take this now that she can no longer see due to Macular degeneration  Other Reaction(s): Other  Hallucinations.  Pt states she can take this now that she can no longer see due to Macular degeneration     Hallucinations   Codeine  Phosphate Other (See Comments)    Hallucinations   Adhesive [Tape] Other (See Comments)    Electrodes for EKG- skin blistering, erythema  [2]  Current Outpatient Medications:    albuterol  (VENTOLIN  HFA) 108 (90 Base) MCG/ACT  inhaler, INHALE 2 PUFFS INTO THE LUNGS EVERY 4 HOURS AS NEEDED FOR WHEEZING OR SHORTNESS OF BREATH, Disp: 18 g, Rfl: 5   amLODipine  (NORVASC ) 2.5 MG tablet, Take 1 tablet (2.5 mg total) by mouth at bedtime., Disp: 90 tablet, Rfl: 3   doxepin  (SINEQUAN ) 10 MG/ML solution, SMARTSIG:1 Milliliter(s) By Mouth Every Night PRN, Disp: , Rfl:    ELIQUIS  5 MG TABS tablet, TAKE 1 TABLET(5 MG) BY MOUTH TWICE DAILY, Disp: 180 tablet, Rfl: 1   famotidine  (PEPCID ) 20 MG tablet, TAKE 1 TABLET(20 MG) BY MOUTH AT BEDTIME, Disp: 90 tablet, Rfl: 1   fluticasone  furoate-vilanterol (BREO ELLIPTA ) 200-25 MCG/ACT AEPB, Inhale 1 puff into the lungs daily., Disp: 60 each, Rfl: 5   ipratropium (ATROVENT ) 0.06 % nasal spray, Place 2 sprays into both nostrils 4 (four) times daily., Disp: 15 mL, Rfl: 12   meclizine  (ANTIVERT ) 25 MG tablet, Take 1 tablet (25 mg total) by mouth daily as needed for dizziness., Disp: 30 tablet, Rfl: 0   montelukast  (SINGULAIR ) 10 MG tablet, TAKE 1 TABLET(10 MG) BY MOUTH AT BEDTIME, Disp: 90 tablet, Rfl: 3   nerandomilast  (JASCAYD ) 18 MG tablet, Take 18 mg by mouth every 12 (twelve) hours., Disp: 60 tablet, Rfl: 3   pantoprazole  (PROTONIX ) 40 MG tablet, Take 1 tablet (40 mg total) by mouth 2 (two) times daily., Disp: 180 tablet, Rfl: 3   rosuvastatin  (CRESTOR ) 10 MG tablet, Take 1 tablet (10 mg total) by mouth daily., Disp: 90 tablet, Rfl: 3  Current Facility-Administered Medications:    azelastine  (ASTELIN ) 0.1 % nasal spray 2 spray, 2 spray, Each Nare, BID,   "

## 2024-02-09 ENCOUNTER — Other Ambulatory Visit (HOSPITAL_COMMUNITY): Payer: Self-pay

## 2024-02-09 ENCOUNTER — Encounter: Payer: Self-pay | Admitting: Hematology and Oncology

## 2024-02-10 ENCOUNTER — Other Ambulatory Visit: Payer: Self-pay | Admitting: Nurse Practitioner

## 2024-02-12 ENCOUNTER — Telehealth: Payer: Self-pay

## 2024-02-12 ENCOUNTER — Other Ambulatory Visit (HOSPITAL_COMMUNITY): Payer: Self-pay

## 2024-02-12 ENCOUNTER — Encounter: Payer: Self-pay | Admitting: Hematology and Oncology

## 2024-02-12 NOTE — Telephone Encounter (Signed)
 Received notification via specialty pharmacy encounter that pt's copay for Jascayd  is currently $1706. Pt enrolled in Pulmonary Fibrosis grant through [employer]:  Amount: $5000 Award Period: 08/16/23 - 02/11/25 BIN: [id number] PCN: [id number] Group: [id number] ID: [id number]  For pharmacy inquiries, contact PD[employer] at [phone]. For patient inquiries, contact [employer] at [phone].

## 2024-02-13 ENCOUNTER — Ambulatory Visit
Admission: RE | Admit: 2024-02-13 | Discharge: 2024-02-13 | Disposition: A | Payer: Self-pay | Source: Ambulatory Visit | Attending: Family Medicine | Admitting: Family Medicine

## 2024-02-13 ENCOUNTER — Encounter: Payer: Self-pay | Admitting: Hematology and Oncology

## 2024-02-13 ENCOUNTER — Other Ambulatory Visit (HOSPITAL_COMMUNITY): Payer: Self-pay

## 2024-02-13 DIAGNOSIS — Z1231 Encounter for screening mammogram for malignant neoplasm of breast: Secondary | ICD-10-CM

## 2024-02-16 ENCOUNTER — Other Ambulatory Visit (HOSPITAL_COMMUNITY): Payer: Self-pay

## 2024-02-19 ENCOUNTER — Other Ambulatory Visit (HOSPITAL_COMMUNITY): Payer: Self-pay

## 2024-02-20 ENCOUNTER — Other Ambulatory Visit (HOSPITAL_COMMUNITY): Payer: Self-pay

## 2024-02-21 ENCOUNTER — Other Ambulatory Visit: Payer: Self-pay

## 2024-02-23 ENCOUNTER — Other Ambulatory Visit: Payer: Self-pay

## 2024-03-01 ENCOUNTER — Other Ambulatory Visit: Payer: Self-pay | Admitting: Cardiovascular Disease

## 2024-04-18 ENCOUNTER — Ambulatory Visit: Admitting: Internal Medicine

## 2024-07-04 ENCOUNTER — Telehealth: Admitting: Hematology and Oncology
